# Patient Record
Sex: Male | Born: 1950 | Race: Black or African American | Hispanic: No | Smoking: Current some day smoker
Health system: Southern US, Community
[De-identification: ages and names within clinical notes are randomized; demographics above are authoritative.]

## PROBLEM LIST (undated history)

## (undated) DIAGNOSIS — H409 Unspecified glaucoma: Secondary | ICD-10-CM

## (undated) DIAGNOSIS — J449 Chronic obstructive pulmonary disease, unspecified: Secondary | ICD-10-CM

## (undated) DIAGNOSIS — T7840XA Allergy, unspecified, initial encounter: Secondary | ICD-10-CM

## (undated) DIAGNOSIS — IMO0001 Reserved for inherently not codable concepts without codable children: Secondary | ICD-10-CM

## (undated) DIAGNOSIS — S92911A Unspecified fracture of right toe(s), initial encounter for closed fracture: Secondary | ICD-10-CM

## (undated) DIAGNOSIS — E785 Hyperlipidemia, unspecified: Secondary | ICD-10-CM

## (undated) DIAGNOSIS — Z720 Tobacco use: Secondary | ICD-10-CM

## (undated) DIAGNOSIS — Z973 Presence of spectacles and contact lenses: Secondary | ICD-10-CM

## (undated) DIAGNOSIS — M5412 Radiculopathy, cervical region: Secondary | ICD-10-CM

## (undated) DIAGNOSIS — Z9289 Personal history of other medical treatment: Secondary | ICD-10-CM

## (undated) DIAGNOSIS — M545 Low back pain, unspecified: Secondary | ICD-10-CM

## (undated) DIAGNOSIS — K219 Gastro-esophageal reflux disease without esophagitis: Secondary | ICD-10-CM

## (undated) DIAGNOSIS — D649 Anemia, unspecified: Secondary | ICD-10-CM

## (undated) DIAGNOSIS — J189 Pneumonia, unspecified organism: Secondary | ICD-10-CM

## (undated) DIAGNOSIS — G8929 Other chronic pain: Secondary | ICD-10-CM

## (undated) DIAGNOSIS — I10 Essential (primary) hypertension: Secondary | ICD-10-CM

## (undated) DIAGNOSIS — D689 Coagulation defect, unspecified: Secondary | ICD-10-CM

## (undated) DIAGNOSIS — M1990 Unspecified osteoarthritis, unspecified site: Secondary | ICD-10-CM

## (undated) DIAGNOSIS — I739 Peripheral vascular disease, unspecified: Secondary | ICD-10-CM

## (undated) HISTORY — PX: INGUINAL HERNIA REPAIR: SUR1180

## (undated) HISTORY — DX: Essential (primary) hypertension: I10

## (undated) HISTORY — DX: Unspecified osteoarthritis, unspecified site: M19.90

## (undated) HISTORY — DX: Coagulation defect, unspecified: D68.9

## (undated) HISTORY — DX: Radiculopathy, cervical region: M54.12

## (undated) HISTORY — DX: Unspecified glaucoma: H40.9

## (undated) HISTORY — DX: Chronic obstructive pulmonary disease, unspecified: J44.9

## (undated) HISTORY — DX: Tobacco use: Z72.0

## (undated) HISTORY — DX: Hyperlipidemia, unspecified: E78.5

## (undated) HISTORY — DX: Peripheral vascular disease, unspecified: I73.9

## (undated) HISTORY — DX: Unspecified fracture of right toe(s), initial encounter for closed fracture: S92.911A

## (undated) HISTORY — DX: Allergy, unspecified, initial encounter: T78.40XA

## (undated) HISTORY — DX: Gastro-esophageal reflux disease without esophagitis: K21.9

## (undated) HISTORY — PX: VASCULAR SURGERY: SHX849

---

## 1989-08-26 HISTORY — PX: INGUINAL HERNIA REPAIR: SUR1180

## 1994-12-26 HISTORY — PX: LUMBAR DISC SURGERY: SHX700

## 1994-12-26 HISTORY — PX: BACK SURGERY: SHX140

## 1998-03-30 ENCOUNTER — Ambulatory Visit (HOSPITAL_COMMUNITY): Admission: RE | Admit: 1998-03-30 | Discharge: 1998-03-30 | Payer: Self-pay | Admitting: Gastroenterology

## 2000-12-07 ENCOUNTER — Encounter: Admission: RE | Admit: 2000-12-07 | Discharge: 2000-12-07 | Payer: Self-pay | Admitting: Gastroenterology

## 2000-12-07 ENCOUNTER — Encounter: Payer: Self-pay | Admitting: Gastroenterology

## 2000-12-08 ENCOUNTER — Ambulatory Visit (HOSPITAL_COMMUNITY): Admission: RE | Admit: 2000-12-08 | Discharge: 2000-12-08 | Payer: Self-pay | Admitting: Gastroenterology

## 2000-12-08 ENCOUNTER — Encounter: Payer: Self-pay | Admitting: Gastroenterology

## 2001-12-22 ENCOUNTER — Emergency Department (HOSPITAL_COMMUNITY): Admission: EM | Admit: 2001-12-22 | Discharge: 2001-12-22 | Payer: Self-pay | Admitting: Emergency Medicine

## 2001-12-22 ENCOUNTER — Encounter: Payer: Self-pay | Admitting: Emergency Medicine

## 2001-12-23 ENCOUNTER — Emergency Department (HOSPITAL_COMMUNITY): Admission: EM | Admit: 2001-12-23 | Discharge: 2001-12-23 | Payer: Self-pay

## 2001-12-25 ENCOUNTER — Emergency Department (HOSPITAL_COMMUNITY): Admission: EM | Admit: 2001-12-25 | Discharge: 2001-12-25 | Payer: Self-pay | Admitting: Emergency Medicine

## 2001-12-25 ENCOUNTER — Encounter: Payer: Self-pay | Admitting: Emergency Medicine

## 2002-01-10 ENCOUNTER — Encounter: Payer: Self-pay | Admitting: Gastroenterology

## 2002-01-10 ENCOUNTER — Ambulatory Visit (HOSPITAL_COMMUNITY): Admission: RE | Admit: 2002-01-10 | Discharge: 2002-01-10 | Payer: Self-pay | Admitting: Gastroenterology

## 2004-03-02 ENCOUNTER — Emergency Department (HOSPITAL_COMMUNITY): Admission: EM | Admit: 2004-03-02 | Discharge: 2004-03-02 | Payer: Self-pay | Admitting: Emergency Medicine

## 2004-03-05 ENCOUNTER — Encounter: Admission: RE | Admit: 2004-03-05 | Discharge: 2004-03-05 | Payer: Self-pay | Admitting: Gastroenterology

## 2004-09-14 ENCOUNTER — Emergency Department (HOSPITAL_COMMUNITY): Admission: EM | Admit: 2004-09-14 | Discharge: 2004-09-14 | Payer: Self-pay | Admitting: Family Medicine

## 2004-09-17 ENCOUNTER — Emergency Department (HOSPITAL_COMMUNITY): Admission: EM | Admit: 2004-09-17 | Discharge: 2004-09-17 | Payer: Self-pay | Admitting: Emergency Medicine

## 2004-09-28 ENCOUNTER — Emergency Department (HOSPITAL_COMMUNITY): Admission: EM | Admit: 2004-09-28 | Discharge: 2004-09-28 | Payer: Self-pay | Admitting: Emergency Medicine

## 2004-10-01 ENCOUNTER — Emergency Department (HOSPITAL_COMMUNITY): Admission: EM | Admit: 2004-10-01 | Discharge: 2004-10-01 | Payer: Self-pay | Admitting: Family Medicine

## 2004-10-08 ENCOUNTER — Emergency Department (HOSPITAL_COMMUNITY): Admission: AD | Admit: 2004-10-08 | Discharge: 2004-10-08 | Payer: Self-pay | Admitting: Family Medicine

## 2004-10-29 ENCOUNTER — Ambulatory Visit: Payer: Self-pay | Admitting: Family Medicine

## 2004-11-12 ENCOUNTER — Ambulatory Visit: Payer: Self-pay | Admitting: Family Medicine

## 2004-11-15 ENCOUNTER — Encounter: Admission: RE | Admit: 2004-11-15 | Discharge: 2004-11-15 | Payer: Self-pay | Admitting: *Deleted

## 2004-12-22 ENCOUNTER — Ambulatory Visit: Payer: Self-pay | Admitting: Sports Medicine

## 2004-12-24 ENCOUNTER — Encounter: Admission: RE | Admit: 2004-12-24 | Discharge: 2005-01-27 | Payer: Self-pay | Admitting: *Deleted

## 2005-01-28 ENCOUNTER — Ambulatory Visit: Payer: Self-pay | Admitting: Sports Medicine

## 2005-03-09 ENCOUNTER — Ambulatory Visit: Payer: Self-pay | Admitting: Family Medicine

## 2005-05-02 ENCOUNTER — Ambulatory Visit: Payer: Self-pay | Admitting: Family Medicine

## 2005-05-05 ENCOUNTER — Ambulatory Visit: Payer: Self-pay | Admitting: Family Medicine

## 2005-06-11 ENCOUNTER — Emergency Department (HOSPITAL_COMMUNITY): Admission: EM | Admit: 2005-06-11 | Discharge: 2005-06-11 | Payer: Self-pay | Admitting: Emergency Medicine

## 2005-07-20 ENCOUNTER — Ambulatory Visit: Payer: Self-pay | Admitting: Sports Medicine

## 2005-08-02 ENCOUNTER — Ambulatory Visit (HOSPITAL_COMMUNITY): Admission: RE | Admit: 2005-08-02 | Discharge: 2005-08-02 | Payer: Self-pay | Admitting: Sports Medicine

## 2005-08-17 ENCOUNTER — Ambulatory Visit: Payer: Self-pay | Admitting: Family Medicine

## 2005-09-16 ENCOUNTER — Ambulatory Visit: Payer: Self-pay | Admitting: Family Medicine

## 2005-09-27 ENCOUNTER — Ambulatory Visit: Payer: Self-pay | Admitting: Sports Medicine

## 2005-10-04 ENCOUNTER — Inpatient Hospital Stay (HOSPITAL_COMMUNITY): Admission: AD | Admit: 2005-10-04 | Discharge: 2005-10-05 | Payer: Self-pay | Admitting: Cardiology

## 2005-11-28 ENCOUNTER — Ambulatory Visit: Payer: Self-pay | Admitting: Family Medicine

## 2005-12-27 ENCOUNTER — Ambulatory Visit (HOSPITAL_COMMUNITY): Admission: RE | Admit: 2005-12-27 | Discharge: 2005-12-27 | Payer: Self-pay | Admitting: Cardiology

## 2006-02-18 IMAGING — CR DG CHEST 2V
2 series · 2 of 2 positions shown · non-contrast
Comparison: 03/02/04
 There is some mild central peribronchial thickening.

CLINICAL DATA: Chest pain, shortness of breath, cough. 
 TWO VIEW CHEST

[view not recorded (1 of 2)]
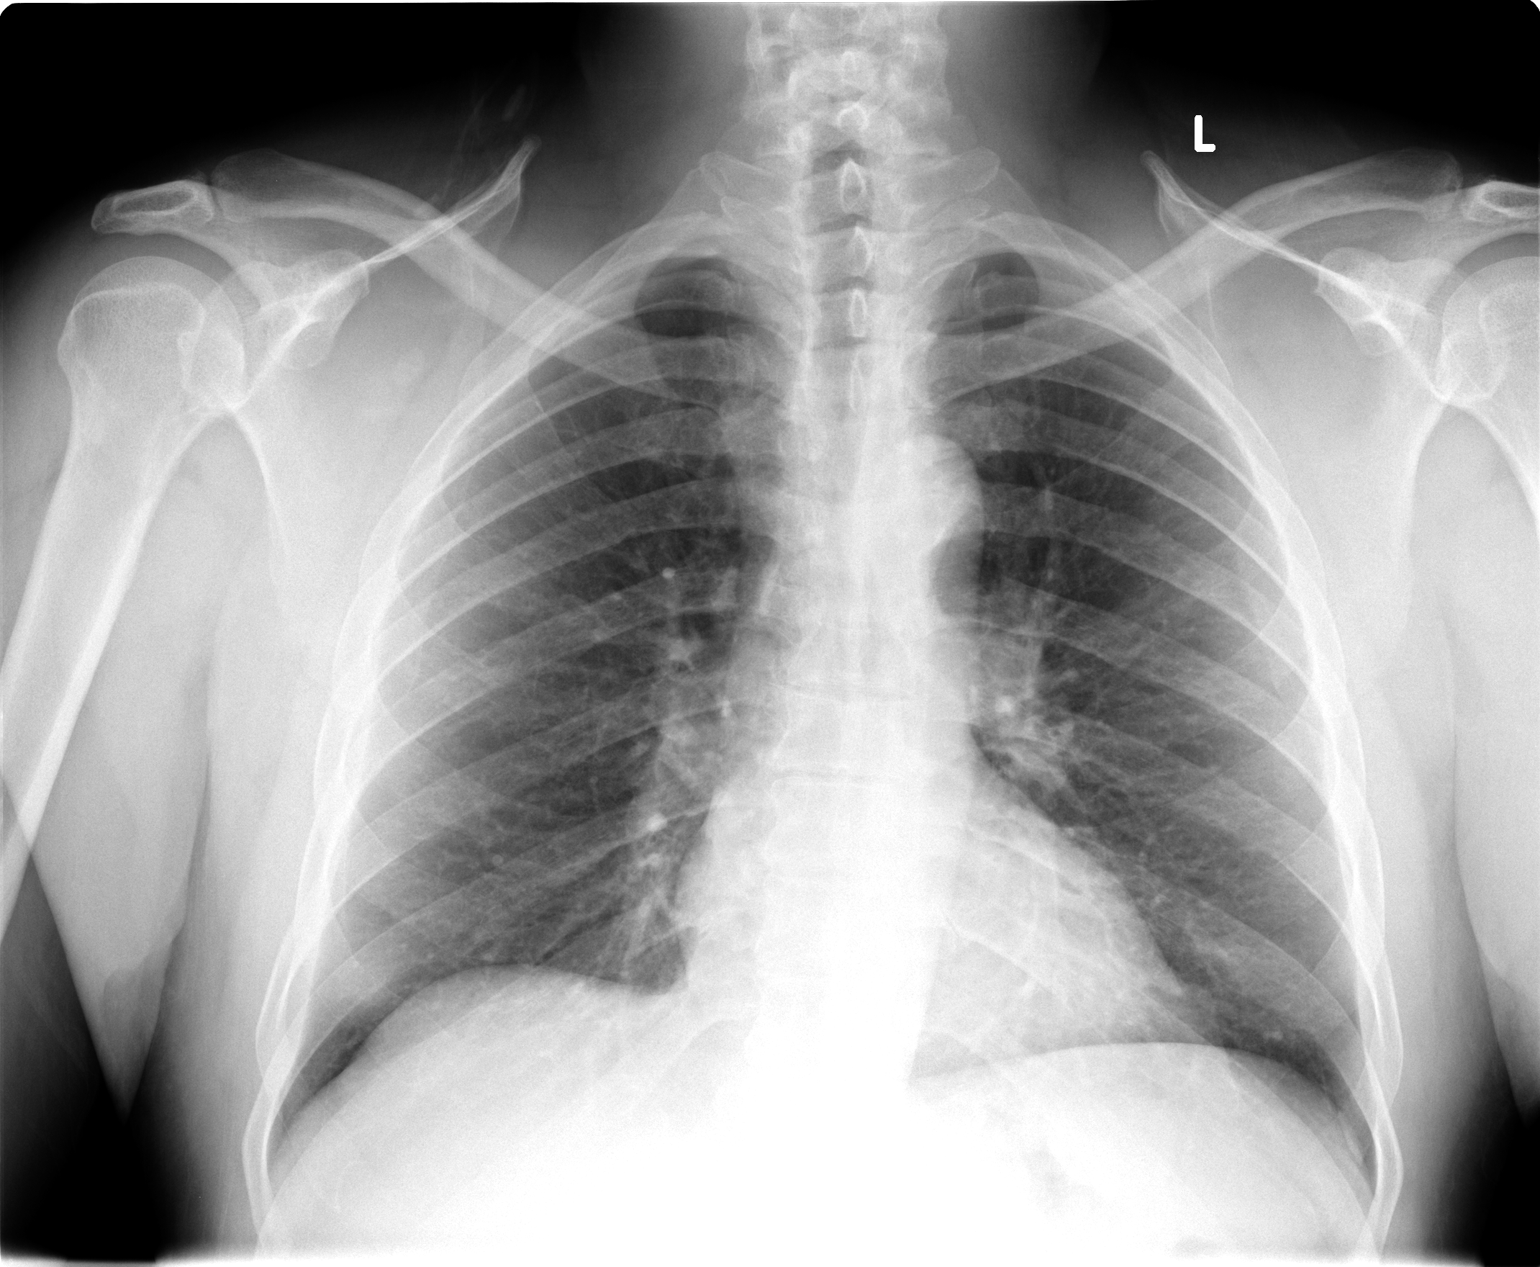

[view not recorded (2 of 2)]
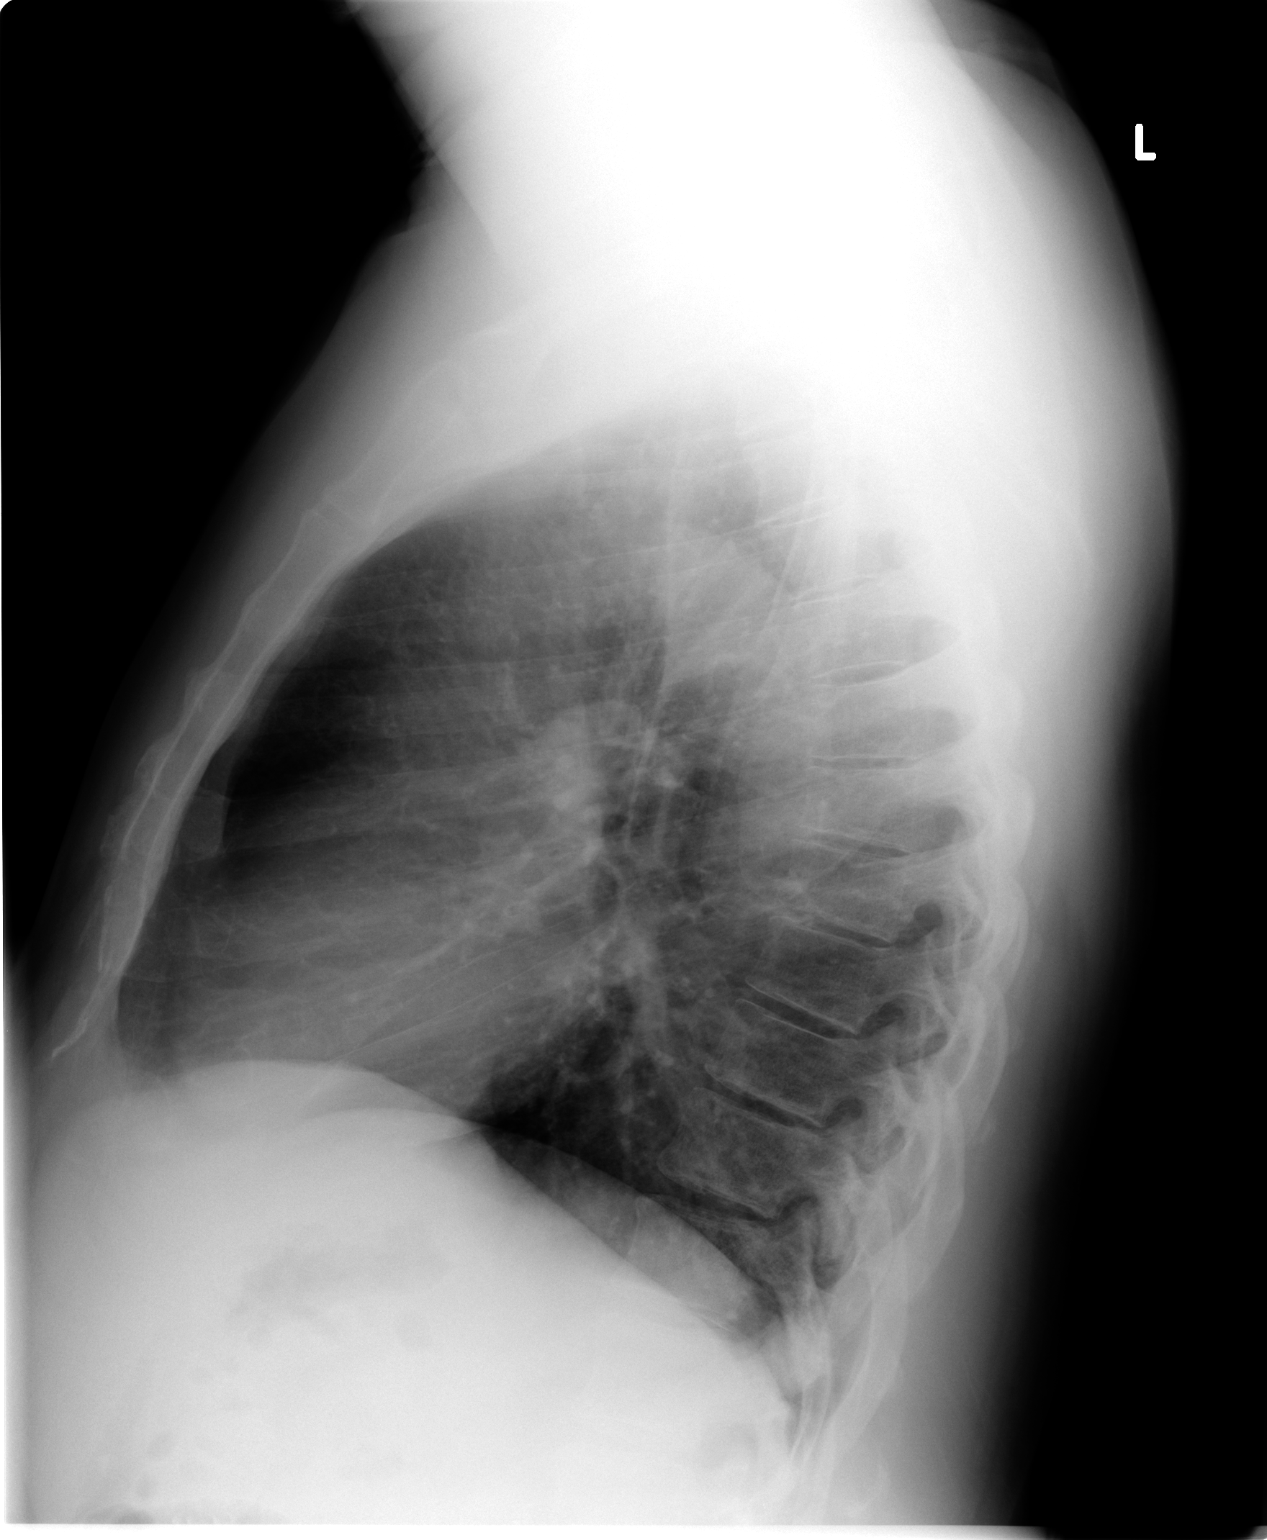

[2 of 2 positions shown; findings below may reference images not displayed]

No focal infiltrate or overt edema. Heart size normal. Mildly tortuous thoracic aorta. No effusion.  
 IMPRESSION 
 Mild peribronchial thickening, stable since 03/02/04.

## 2006-02-20 ENCOUNTER — Ambulatory Visit: Payer: Self-pay | Admitting: Family Medicine

## 2006-05-26 ENCOUNTER — Emergency Department (HOSPITAL_COMMUNITY): Admission: EM | Admit: 2006-05-26 | Discharge: 2006-05-26 | Payer: Self-pay | Admitting: Emergency Medicine

## 2006-07-19 ENCOUNTER — Ambulatory Visit: Payer: Self-pay | Admitting: Family Medicine

## 2006-08-09 ENCOUNTER — Ambulatory Visit: Payer: Self-pay | Admitting: Family Medicine

## 2006-10-28 ENCOUNTER — Emergency Department (HOSPITAL_COMMUNITY): Admission: EM | Admit: 2006-10-28 | Discharge: 2006-10-28 | Payer: Self-pay | Admitting: Emergency Medicine

## 2007-02-22 DIAGNOSIS — Z720 Tobacco use: Secondary | ICD-10-CM | POA: Insufficient documentation

## 2007-02-22 DIAGNOSIS — M1990 Unspecified osteoarthritis, unspecified site: Secondary | ICD-10-CM | POA: Insufficient documentation

## 2007-02-22 DIAGNOSIS — E7800 Pure hypercholesterolemia, unspecified: Secondary | ICD-10-CM | POA: Insufficient documentation

## 2007-02-22 DIAGNOSIS — I739 Peripheral vascular disease, unspecified: Secondary | ICD-10-CM | POA: Insufficient documentation

## 2007-02-22 DIAGNOSIS — E669 Obesity, unspecified: Secondary | ICD-10-CM | POA: Insufficient documentation

## 2007-02-22 DIAGNOSIS — F17200 Nicotine dependence, unspecified, uncomplicated: Secondary | ICD-10-CM | POA: Insufficient documentation

## 2007-02-22 DIAGNOSIS — I10 Essential (primary) hypertension: Secondary | ICD-10-CM | POA: Insufficient documentation

## 2007-03-01 ENCOUNTER — Encounter: Payer: Self-pay | Admitting: Internal Medicine

## 2007-03-07 ENCOUNTER — Encounter: Payer: Self-pay | Admitting: Family Medicine

## 2007-03-07 ENCOUNTER — Inpatient Hospital Stay (HOSPITAL_COMMUNITY): Admission: RE | Admit: 2007-03-07 | Discharge: 2007-03-08 | Payer: Self-pay | Admitting: Cardiology

## 2007-04-03 ENCOUNTER — Encounter: Payer: Self-pay | Admitting: Family Medicine

## 2007-08-30 ENCOUNTER — Ambulatory Visit: Payer: Self-pay | Admitting: Family Medicine

## 2007-08-30 ENCOUNTER — Encounter: Admission: RE | Admit: 2007-08-30 | Discharge: 2007-08-30 | Payer: Self-pay | Admitting: Sports Medicine

## 2007-09-03 ENCOUNTER — Telehealth (INDEPENDENT_AMBULATORY_CARE_PROVIDER_SITE_OTHER): Payer: Self-pay | Admitting: Family Medicine

## 2007-09-04 ENCOUNTER — Ambulatory Visit: Payer: Self-pay | Admitting: Family Medicine

## 2007-09-04 ENCOUNTER — Telehealth (INDEPENDENT_AMBULATORY_CARE_PROVIDER_SITE_OTHER): Payer: Self-pay | Admitting: Family Medicine

## 2007-09-04 ENCOUNTER — Encounter (INDEPENDENT_AMBULATORY_CARE_PROVIDER_SITE_OTHER): Payer: Self-pay | Admitting: Family Medicine

## 2007-09-04 LAB — CONVERTED CEMR LAB
ALT: 13 units/L (ref 0–53)
AST: 19 units/L (ref 0–37)
Albumin: 4.4 g/dL (ref 3.5–5.2)
Alkaline Phosphatase: 81 units/L (ref 39–117)
BUN: 12 mg/dL (ref 6–23)
CO2: 22 meq/L (ref 19–32)
Calcium: 8.8 mg/dL (ref 8.4–10.5)
Chloride: 103 meq/L (ref 96–112)
Cholesterol: 111 mg/dL (ref 0–200)
Creatinine, Ser: 0.84 mg/dL (ref 0.40–1.50)
Glucose, Bld: 115 mg/dL — ABNORMAL HIGH (ref 70–99)
HDL: 36 mg/dL — ABNORMAL LOW (ref >39)
LDL Cholesterol: 35 mg/dL (ref 0–99)
Potassium: 4.1 meq/L (ref 3.5–5.3)
Sodium: 137 meq/L (ref 135–145)
Total Bilirubin: 0.4 mg/dL (ref 0.3–1.2)
Total CHOL/HDL Ratio: 3.1
Total Protein: 7.2 g/dL (ref 6.0–8.3)
Triglycerides: 201 mg/dL — ABNORMAL HIGH (ref <150)
VLDL: 40 mg/dL (ref 0–40)

## 2007-09-19 ENCOUNTER — Encounter (INDEPENDENT_AMBULATORY_CARE_PROVIDER_SITE_OTHER): Payer: Self-pay | Admitting: Family Medicine

## 2007-09-28 ENCOUNTER — Encounter (INDEPENDENT_AMBULATORY_CARE_PROVIDER_SITE_OTHER): Payer: Self-pay | Admitting: Family Medicine

## 2007-09-28 ENCOUNTER — Encounter (INDEPENDENT_AMBULATORY_CARE_PROVIDER_SITE_OTHER): Payer: Self-pay | Admitting: *Deleted

## 2007-10-16 ENCOUNTER — Encounter (INDEPENDENT_AMBULATORY_CARE_PROVIDER_SITE_OTHER): Payer: Self-pay | Admitting: Family Medicine

## 2007-10-31 ENCOUNTER — Ambulatory Visit: Payer: Self-pay | Admitting: Family Medicine

## 2007-11-28 ENCOUNTER — Telehealth (INDEPENDENT_AMBULATORY_CARE_PROVIDER_SITE_OTHER): Payer: Self-pay | Admitting: Family Medicine

## 2007-12-31 ENCOUNTER — Ambulatory Visit: Payer: Self-pay | Admitting: Family Medicine

## 2007-12-31 LAB — CONVERTED CEMR LAB: Hgb A1c MFr Bld: 6.9 %

## 2008-05-09 ENCOUNTER — Encounter (INDEPENDENT_AMBULATORY_CARE_PROVIDER_SITE_OTHER): Payer: Self-pay | Admitting: Family Medicine

## 2008-06-30 ENCOUNTER — Encounter: Admission: RE | Admit: 2008-06-30 | Discharge: 2008-06-30 | Payer: Self-pay | Admitting: Gastroenterology

## 2008-09-15 ENCOUNTER — Encounter (INDEPENDENT_AMBULATORY_CARE_PROVIDER_SITE_OTHER): Payer: Self-pay | Admitting: Family Medicine

## 2008-09-24 ENCOUNTER — Encounter: Admission: RE | Admit: 2008-09-24 | Discharge: 2008-09-24 | Payer: Self-pay | Admitting: Neurosurgery

## 2008-10-03 ENCOUNTER — Inpatient Hospital Stay (HOSPITAL_COMMUNITY): Admission: RE | Admit: 2008-10-03 | Discharge: 2008-10-04 | Payer: Self-pay | Admitting: Neurosurgery

## 2008-10-23 ENCOUNTER — Ambulatory Visit: Payer: Self-pay | Admitting: Family Medicine

## 2008-10-29 ENCOUNTER — Encounter (INDEPENDENT_AMBULATORY_CARE_PROVIDER_SITE_OTHER): Payer: Self-pay | Admitting: Family Medicine

## 2008-12-15 ENCOUNTER — Encounter (INDEPENDENT_AMBULATORY_CARE_PROVIDER_SITE_OTHER): Payer: Self-pay | Admitting: Family Medicine

## 2009-01-14 ENCOUNTER — Encounter (INDEPENDENT_AMBULATORY_CARE_PROVIDER_SITE_OTHER): Payer: Self-pay | Admitting: Family Medicine

## 2009-01-14 ENCOUNTER — Ambulatory Visit: Payer: Self-pay | Admitting: Family Medicine

## 2009-01-14 DIAGNOSIS — K219 Gastro-esophageal reflux disease without esophagitis: Secondary | ICD-10-CM | POA: Insufficient documentation

## 2009-01-14 LAB — CONVERTED CEMR LAB
ALT: 12 units/L (ref 0–53)
AST: 17 units/L (ref 0–37)
Albumin: 4.4 g/dL (ref 3.5–5.2)
Alkaline Phosphatase: 79 units/L (ref 39–117)
BUN: 11 mg/dL (ref 6–23)
CO2: 18 meq/L — ABNORMAL LOW (ref 19–32)
Calcium: 8.6 mg/dL (ref 8.4–10.5)
Chloride: 101 meq/L (ref 96–112)
Creatinine, Ser: 0.91 mg/dL (ref 0.40–1.50)
Glucose, Bld: 104 mg/dL — ABNORMAL HIGH (ref 70–99)
Hgb A1c MFr Bld: 6.7 %
Potassium: 4.2 meq/L (ref 3.5–5.3)
Sodium: 138 meq/L (ref 135–145)
Total Bilirubin: 0.3 mg/dL (ref 0.3–1.2)
Total Protein: 7.7 g/dL (ref 6.0–8.3)

## 2009-01-15 ENCOUNTER — Encounter (INDEPENDENT_AMBULATORY_CARE_PROVIDER_SITE_OTHER): Payer: Self-pay | Admitting: Family Medicine

## 2009-01-30 ENCOUNTER — Telehealth (INDEPENDENT_AMBULATORY_CARE_PROVIDER_SITE_OTHER): Payer: Self-pay | Admitting: *Deleted

## 2009-02-16 ENCOUNTER — Encounter (INDEPENDENT_AMBULATORY_CARE_PROVIDER_SITE_OTHER): Payer: Self-pay | Admitting: Family Medicine

## 2009-03-17 ENCOUNTER — Encounter (INDEPENDENT_AMBULATORY_CARE_PROVIDER_SITE_OTHER): Payer: Self-pay | Admitting: Family Medicine

## 2009-06-18 ENCOUNTER — Ambulatory Visit: Payer: Self-pay | Admitting: Family Medicine

## 2009-06-18 DIAGNOSIS — E119 Type 2 diabetes mellitus without complications: Secondary | ICD-10-CM | POA: Insufficient documentation

## 2009-06-18 LAB — CONVERTED CEMR LAB: Hgb A1c MFr Bld: 7.3 %

## 2009-06-30 ENCOUNTER — Encounter: Admission: RE | Admit: 2009-06-30 | Discharge: 2009-09-28 | Payer: Self-pay | Admitting: Family Medicine

## 2009-07-02 ENCOUNTER — Encounter: Payer: Self-pay | Admitting: Family Medicine

## 2009-07-08 ENCOUNTER — Encounter: Payer: Self-pay | Admitting: Family Medicine

## 2009-07-13 ENCOUNTER — Ambulatory Visit (HOSPITAL_COMMUNITY): Admission: AD | Admit: 2009-07-13 | Discharge: 2009-07-14 | Payer: Self-pay | Admitting: Cardiology

## 2009-09-03 ENCOUNTER — Encounter: Payer: Self-pay | Admitting: Family Medicine

## 2009-09-18 ENCOUNTER — Encounter (INDEPENDENT_AMBULATORY_CARE_PROVIDER_SITE_OTHER): Payer: Self-pay | Admitting: *Deleted

## 2009-11-11 ENCOUNTER — Ambulatory Visit: Payer: Self-pay | Admitting: Family Medicine

## 2009-12-08 ENCOUNTER — Encounter: Payer: Self-pay | Admitting: Family Medicine

## 2010-03-15 ENCOUNTER — Encounter: Payer: Self-pay | Admitting: Family Medicine

## 2010-03-24 ENCOUNTER — Encounter: Payer: Self-pay | Admitting: Family Medicine

## 2010-04-26 ENCOUNTER — Encounter: Admission: RE | Admit: 2010-04-26 | Discharge: 2010-04-26 | Payer: Self-pay | Admitting: Cardiovascular Disease

## 2010-04-29 ENCOUNTER — Ambulatory Visit (HOSPITAL_COMMUNITY): Admission: RE | Admit: 2010-04-29 | Discharge: 2010-04-30 | Payer: Self-pay | Admitting: Cardiovascular Disease

## 2010-05-03 ENCOUNTER — Telehealth: Payer: Self-pay | Admitting: *Deleted

## 2010-07-01 ENCOUNTER — Ambulatory Visit: Payer: Self-pay | Admitting: Family Medicine

## 2010-07-01 ENCOUNTER — Encounter: Payer: Self-pay | Admitting: Family Medicine

## 2010-07-01 LAB — CONVERTED CEMR LAB
ALT: 17 units/L (ref 0–53)
AST: 21 units/L (ref 0–37)
Albumin: 4.4 g/dL (ref 3.5–5.2)
Alkaline Phosphatase: 66 units/L (ref 39–117)
BUN: 11 mg/dL (ref 6–23)
CO2: 19 meq/L (ref 19–32)
Calcium: 9.9 mg/dL (ref 8.4–10.5)
Chloride: 107 meq/L (ref 96–112)
Cholesterol: 153 mg/dL (ref 0–200)
Creatinine, Ser: 0.82 mg/dL (ref 0.40–1.50)
Glucose, Bld: 103 mg/dL — ABNORMAL HIGH (ref 70–99)
HDL: 44 mg/dL (ref >39)
Hgb A1c MFr Bld: 6.1 %
LDL Cholesterol: 55 mg/dL (ref 0–99)
Potassium: 4.4 meq/L (ref 3.5–5.3)
Sodium: 139 meq/L (ref 135–145)
Total Bilirubin: 0.3 mg/dL (ref 0.3–1.2)
Total CHOL/HDL Ratio: 3.5
Total Protein: 7.6 g/dL (ref 6.0–8.3)
Triglycerides: 271 mg/dL — ABNORMAL HIGH (ref <150)
VLDL: 54 mg/dL — ABNORMAL HIGH (ref 0–40)

## 2010-10-01 ENCOUNTER — Ambulatory Visit: Payer: Self-pay | Admitting: Family Medicine

## 2010-10-01 LAB — CONVERTED CEMR LAB: Hgb A1c MFr Bld: 6.3 %

## 2010-10-20 ENCOUNTER — Encounter: Payer: Self-pay | Admitting: Family Medicine

## 2010-10-28 ENCOUNTER — Encounter: Payer: Self-pay | Admitting: Family Medicine

## 2010-11-09 ENCOUNTER — Ambulatory Visit: Payer: Self-pay | Admitting: Family Medicine

## 2011-01-17 ENCOUNTER — Encounter: Payer: Self-pay | Admitting: Neurosurgery

## 2011-01-25 NOTE — Consult Note (Signed)
Summary: [facility]  [facility]   Imported By: Eather Colas PATE CMA, 05/14/2008 15:40:19  _____________________________________________________________________  External Attachment:    Type:   Image     Comment:   External Document

## 2011-01-25 NOTE — Assessment & Plan Note (Signed)
Summary: [name]   Vital Signs:  Patient profile:   60 year old male Height:      66 inches Weight:      203.2 pounds BMI:     32.92 Temp:     98.5 degrees F oral Pulse rate:   86 / minute BP sitting:   140 / 75  (left arm) Cuff size:   regular  Vitals Entered By: Garen Grams LPN (October 01, 2010 10:15 AM) CC: f/u HTN,DM, left ear pain Is Patient Diabetic? Yes Did you bring your meter with you today? No Pain Assessment Patient in pain? no        Primary Care Provider:  Levander Campion MD  CC:  f/u HTN, DM, and left ear pain.  History of Present Illness:   Here to f/u DM, HTN,     HTN- Taking all meds as prescribed, no chest pain, no dizziness, no SOB, does not have BP cuff at home    DM- tolerating Metformin and Glipizide, no polyuria, lowest BS 86, highest in 200's a few months ago   Ear pain- left ear pain x 1 week, attemped to clean it out no change, no ringing in ears, able to hear as normal, no drainage from ear. No sore throat, runny nose last week, no cough  Habits & Providers  Alcohol-Tobacco-Diet     Tobacco Status: current     Tobacco Counseling: to quit use of tobacco products     Cigarette Packs/Day: 0.5  Current Medications (verified): 1)  Neurontin 800 Mg  Tabs (Gabapentin) .Marland Kitchen.. 1 By Mouth Twice Daily 2)  Metformin Hcl 500 Mg  Tabs (Metformin Hcl) .Marland Kitchen.. 1 By Mouth Twice Daily 3)  Lotrel 10-20 Mg  Caps (Amlodipine Besy-Benazepril Hcl) .Marland Kitchen.. 1 By Mouth Daily 4)  Vytorin 10-40 Mg  Tabs (Ezetimibe-Simvastatin) .Marland Kitchen.. 1 By Mouth Daily 5)  Pantoprazole Sodium 40 Mg Tbec (Pantoprazole Sodium) .Marland Kitchen.. 1 By Mouth Daily For Heartburn 6)  Plavix 75 Mg  Tabs (Clopidogrel Bisulfate) .Marland Kitchen.. 1 By Mouth Daily 7)  Nabumetone 500 Mg  Tabs (Nabumetone) .Marland Kitchen.. 1 By Mouth Daily 8)  Wellbutrin Xl 150 Mg  Tb24 (Bupropion Hcl) .Marland Kitchen.. 1 By Mouth Daily 9)  Glipizide 5 Mg Tabs (Glipizide) .Marland Kitchen.. 1 By Mouth Daily 10)  Blood Glucose Meter  Kit (Blood Glucose Monitoring  Suppl) .... Check Blood Sugar Daily, Sometimes in Am Before Eating, and Sometimes 2 Hours After A Meal, and Record 11)  Pletal 100 Mg Tabs (Cilostazol) .Marland Kitchen.. 1 By Mouth Two Times A Day 12)  Bystolic 20 Mg Tabs (Nebivolol Hcl) .Marland Kitchen.. 1 By Mouth Daily For High Blood Pressure  Allergies (verified): No Known Drug Allergies  Past History:  Past Medical History: Last updated: 07/01/2010 Baseline Cr- 0.9 on 8/07,  cervical radiculopathy (spurring at c5-c6),  hiatial hernia HTN DM PAD hyperlipidemia GERD Osteoarthritis Tobacco abuse   Physical Exam  General:  Vital signs noted NAD alert and oriented Ears:  Right TM,canal clear, Left TM occluded with hard cerumen, s/p extraction TM clear, no erythema, normal hearing bilat Nose:  no nasal discharge.   Mouth:  MMM Neck:  no carotid bruit  Lungs:  CTAB Heart:  RRR, no murmur, no gallop Pulses:  radial pulses 2+, DP decreased bilat Extremities:  no edema Sensation in tact grossly in tact in feet, lower ext   Impression & Recommendations:  Problem # 1:  DIABETES MELLITUS, TYPE II (ICD-250.00) Assessment Unchanged No change to meds, progressing well His updated  medication list for this problem includes:    Metformin Hcl 500 Mg Tabs (Metformin hcl) .Marland Kitchen... 1 by mouth twice daily    Lotrel 10-20 Mg Caps (Amlodipine besy-benazepril hcl) .Marland Kitchen... 1 by mouth daily    Glipizide 5 Mg Tabs (Glipizide) .Marland Kitchen... 1 by mouth daily  Orders: A1C-[facility] (34742) [facility]- Est  Level 4 (59563)  Labs Reviewed: Creat: 0.82 (07/01/2010)    Reviewed HgBA1c results: 6.3 (10/01/2010)  6.1 (07/01/2010)  Problem # 2:  HYPERTENSION, BENIGN SYSTEMIC (ICD-401.1) Assessment: Improved  Improved, back on meds, increase BB to 20mg , goal < 130/80 His updated medication list for this problem includes:    Lotrel 10-20 Mg Caps (Amlodipine besy-benazepril hcl) .Marland Kitchen... 1 by mouth daily    Bystolic 20 Mg Tabs (Nebivolol hcl) .Marland Kitchen... 1 by mouth daily for high blood  pressure  Orders: [facility]- Est  Level 4 (99214)  Problem # 3:  CERUMEN IMPACTION, LEFT (ICD-380.4) Assessment: New ear pain improved s/p wash out Orders: Cerumen Impaction Removal-[facility] (87564) [facility]- Est  Level 4 (33295)  Problem # 4:  TOBACCO DEPENDENCE (ICD-305.1) Assessment: Unchanged  Orders: [facility]- Est  Level 4 (99214)  Complete Medication List: 1)  Neurontin 800 Mg Tabs (Gabapentin) .Marland Kitchen.. 1 by mouth twice daily 2)  Metformin Hcl 500 Mg Tabs (Metformin hcl) .Marland Kitchen.. 1 by mouth twice daily 3)  Lotrel 10-20 Mg Caps (Amlodipine besy-benazepril hcl) .Marland Kitchen.. 1 by mouth daily 4)  Vytorin 10-40 Mg Tabs (Ezetimibe-simvastatin) .Marland Kitchen.. 1 by mouth daily 5)  Pantoprazole Sodium 40 Mg Tbec (Pantoprazole sodium) .Marland Kitchen.. 1 by mouth daily for heartburn 6)  Plavix 75 Mg Tabs (Clopidogrel bisulfate) .Marland Kitchen.. 1 by mouth daily 7)  Nabumetone 500 Mg Tabs (Nabumetone) .Marland Kitchen.. 1 by mouth daily 8)  Wellbutrin Xl 150 Mg Tb24 (Bupropion hcl) .Marland Kitchen.. 1 by mouth daily 9)  Glipizide 5 Mg Tabs (Glipizide) .Marland Kitchen.. 1 by mouth daily 10)  Blood Glucose Meter Kit (Blood glucose monitoring suppl) .... Check blood sugar daily, sometimes in am before eating, and sometimes 2 hours after a meal, and record 11)  Pletal 100 Mg Tabs (Cilostazol) .Marland Kitchen.. 1 by mouth two times a day 12)  Bystolic 20 Mg Tabs (Nebivolol hcl) .Marland Kitchen.. 1 by mouth daily for high blood pressure  Other Orders: Influenza Vaccine NON MCR (18841) Pneumococcal Vaccine (66063) Admin 1st Vaccine (01601)  Patient Instructions: 1)  Increase your bystolic to 20mg  daily 2)  Return in 2 week for a nurse visit for BP and heart rate check 3)  Try to pick up a blood pressure machine and take your blood pressure three times a week- try to get the arm cuff 4)  Do not take both ompeprazole and pantoprazole  5)  Today you got your flu shot and Pneumonia shot   Prescriptions: BYSTOLIC 20 MG TABS (NEBIVOLOL HCL) 1 by mouth daily for high blood pressure  #30 x 3   Entered and Authorized by:    Milinda Antis MD   Signed by:   Milinda Antis MD on 10/01/2010   Method used:   [facility]        [facility] [address] [phone]* (retail)       [address]       [place], [place]  [address]       Ph: [phone] or [phone]       Fax: [phone]   RxID:   [id number]   Laboratory Results   Blood Tests   Date/Time Received: October 01, 2010 10:08 AM  Date/Time Reported: October 01, 2010 10:23 AM   HGBA1C: 6.3%   (Normal Range: Non-Diabetic - 3-6%   Control Diabetic - 6-8%)  Comments: .......test performed by........Marland Kitchen Garen Grams, LPN entered by Terese Door, CMA          Prevention & Chronic Care Immunizations   Influenza vaccine: Fluvax Non-MCR  (10/01/2010)   Influenza vaccine due: 10/30/2008    Tetanus booster: 12/26/2001: Done.   Tetanus booster due: 12/27/2011    Pneumococcal vaccine: Pneumovax  (10/01/2010)    H. zoster vaccine: Not documented  Colorectal Screening   Hemoccult: Not documented   Hemoccult due: Not Indicated    Colonoscopy: Done.  (10/01/2006)   Colonoscopy due: 10/01/2016  Other Screening   PSA: Not documented   Smoking status: current  (10/01/2010)  Diabetes Mellitus   HgbA1C: 6.3  (10/01/2010)   Hemoglobin A1C due: 03/30/2008    Eye exam: Not documented    Foot exam: Not documented   High risk foot: Not documented   Foot care education: Not documented    Urine microalbumin/creatinine ratio: Not documented    Diabetes flowsheet reviewed?: Yes   Progress toward A1C goal: At goal  Lipids   Total Cholesterol: 153  (07/01/2010)   LDL: 55  (07/01/2010)   LDL Direct: Not documented   HDL: 44  (07/01/2010)   Triglycerides: 271  (07/01/2010)    SGOT (AST): 21  (07/01/2010)   SGPT (ALT): 17  (07/01/2010)   Alkaline phosphatase: 66  (07/01/2010)   Total bilirubin: 0.3  (07/01/2010)    Lipid flowsheet reviewed?: Yes   Progress toward LDL goal: At goal  Hypertension   Last Blood  Pressure: 140 / 75  (10/01/2010)   Serum creatinine: 0.82  (07/01/2010)   Serum potassium 4.4  (07/01/2010)    Hypertension flowsheet reviewed?: Yes   Progress toward BP goal: Improved  Self-Management Support :   Personal Goals (by the next clinic visit) :     Personal A1C goal: 7  (07/01/2010)     Personal blood pressure goal: 130/80  (07/01/2010)     Personal LDL goal: 70  (07/01/2010)    Diabetes self-management support: Not documented    Hypertension self-management support: Not documented    Lipid self-management support: Not documented    Immunizations Administered:  Influenza Vaccine # 1:    Vaccine Type: Fluvax Non-MCR    Site: right deltoid    Mfr: GlaxoSmithKline    Dose: 0.5 ml    Route: IM    Given by: Jone Baseman CMA    Exp. Date: 06/22/2011    Lot #: [id number]    VIS given: 07/20/10 version given October 01, 2010.  Pneumonia Vaccine:    Vaccine Type: Pneumovax    Site: left deltoid    Mfr: Merck    Dose: 0.5 ml    Route: IM    Given by: Jone Baseman CMA    Exp. Date: 03/13/2012    Lot #: [id number]    VIS given: 11/30/09 version given October 01, 2010.  Flu Vaccine Consent Questions:    Do you have a history of severe allergic reactions to this vaccine? no    Any prior history of allergic reactions to egg and/or gelatin? no    Do you have a sensitivity to the preservative Thimersol? no    Do you have a past history of Guillan-Barre Syndrome? no    Do you currently have an acute febrile illness?  no    Have you ever had a severe reaction to latex? no    Vaccine information given and explained to patient? yes

## 2011-01-25 NOTE — Assessment & Plan Note (Signed)
Summary: f/up,tcb   Vital Signs:  Patient profile:   60 year old male Weight:      199 pounds Pulse rate:   75 / minute BP sitting:   169 / 92  (right arm) Cuff size:   large  Vitals Entered By: Arlyss Repress CMA, 07/09/10 10:14 AM) CC: f/up HTN. refill meds.out of meds x 3 days. Is Patient Diabetic? No Pain Assessment Patient in pain? no        Primary Care Provider:  Levander Campion MD  CC:  f/up HTN. refill meds.out of meds x 3 days.Marland Kitchen  History of Present Illness:   HTN- out of meds, pt unsure which meds he has at home, brought a list. Recently started on bystolic by Dr. Jacinto Halim. denies chest pain, HA.  [facility]    DM- tolerating Metformin and Glipizide, no polyuria  Hyperlipidemia- tolearting statins, needs blood work today   PAD- poor circulation followed by Dr. Nadara Eaton  Medications reviewed, pt to call back with specific med bottles and which need refills   Habits & Providers  Alcohol-Tobacco-Diet     Tobacco Status: current     Tobacco Counseling: to quit use of tobacco products     Other Tobacco cigar  Comments: smokes 2 cigars daily  Past History:  Family History: Last updated: 07-09-2010 Father- deceased at 85 w/ HTN and heart disease, Mother- deceased at 47 w/ colon CA Brother- PAD  Social History: Last updated: 02/22/2007 Lives in an apt w/ his wife x 10 yrs.  Smokes 1/2 pack per day X 23 years.  Graduated from [employer].  [employer] X 12 yrs.  On full disability secondary to back injury.  Used to work for [employer].  Past Medical History: Baseline Cr- 0.9 on 8/07,  cervical radiculopathy (spurring at c5-c6),  hiatial hernia HTN DM PAD hyperlipidemia GERD Osteoarthritis Tobacco abuse   Past Surgical History: Back surgery - 89 & 89 - 11/30/2004, cardiolite 9/06- no ischemia,  diaphragmatic attenuation artifact - 09/16/2005,  stent of right SFA 12/06 - 01/31/2006  Family History: Father- deceased at 57  w/ HTN and heart disease, Mother- deceased at 44 w/ colon CA Brother- PAD  Physical Exam  General:  Vital signs noted NAD alert and oriented Neck:  no carotid bruit  Lungs:  CTAB Heart:  RRR, no murmur, no gallop Pulses:  radial pulses 2+, DP decreased bilat Extremities:  no edema Sensation in tact    Impression & Recommendations:  Problem # 1:  DIABETES MELLITUS, TYPE II (ICD-250.00) Assessment Improved Labs obtained, no change to meds, improved A1C His updated medication list for this problem includes:    Metformin Hcl 500 Mg Tabs (Metformin hcl) .Marland Kitchen... 1 by mouth twice daily    Lotrel 10-20 Mg Caps (Amlodipine besy-benazepril hcl) .Marland Kitchen... 1 by mouth daily    Glipizide 5 Mg Tabs (Glipizide) .Marland Kitchen... 1 by mouth daily  Orders: A1C-[facility] (86578) [facility]- Est  Level 4 (46962)  Labs Reviewed: Creat: 0.91 (01/14/2009)    Reviewed HgBA1c results: 6.1 (Jul 09, 2010)  7.3 (06/18/2009)  Problem # 2:  HYPERTENSION, BENIGN SYSTEMIC (ICD-401.1) Assessment: Deteriorated  Restart meds pt to call back with meds he takes, RTC 1 month for follow-up His updated medication list for this problem includes:    Lotrel 10-20 Mg Caps (Amlodipine besy-benazepril hcl) .Marland Kitchen... 1 by mouth daily    Bystolic 10 Mg Tabs (Nebivolol hcl) .Marland Kitchen... 1 by mouth daily  Orders: [facility]- Est  Level 4 (99214)  Problem # 3:  HYPERCHOLESTEROLEMIA (ICD-272.0) Assessment: Unchanged check labs and lipid panel His updated medication list for this problem includes:    Vytorin 10-40 Mg Tabs (Ezetimibe-simvastatin) .Marland Kitchen... 1 by mouth daily  Orders: Lipid-[facility] (40981-19147) Comp Met-[facility] (82956-21308) [facility]- Est  Level 4 (65784)  Complete Medication List: 1)  Neurontin 800 Mg Tabs (Gabapentin) .Marland Kitchen.. 1 by mouth twice daily 2)  Metformin Hcl 500 Mg Tabs (Metformin hcl) .Marland Kitchen.. 1 by mouth twice daily 3)  Lotrel 10-20 Mg Caps (Amlodipine besy-benazepril hcl) .Marland Kitchen.. 1 by mouth daily 4)  Vytorin 10-40 Mg Tabs (Ezetimibe-simvastatin) .Marland Kitchen.. 1 by  mouth daily 5)  Omeprazole 40 Mg Cpdr (Omeprazole) .Marland Kitchen.. 1 by mouth daily 6)  Plavix 75 Mg Tabs (Clopidogrel bisulfate) .Marland Kitchen.. 1 by mouth daily 7)  Nabumetone 500 Mg Tabs (Nabumetone) .Marland Kitchen.. 1 by mouth daily 8)  Wellbutrin Xl 150 Mg Tb24 (Bupropion hcl) .Marland Kitchen.. 1 by mouth daily 9)  Glipizide 5 Mg Tabs (Glipizide) .Marland Kitchen.. 1 by mouth daily 10)  Blood Glucose Meter Kit (Blood glucose monitoring suppl) .... Check blood sugar daily, sometimes in am before eating, and sometimes 2 hours after a meal, and record 11)  Pletal 100 Mg Tabs (Cilostazol) .Marland Kitchen.. 1 by mouth two times a day 12)  Bystolic 10 Mg Tabs (Nebivolol hcl) .Marland Kitchen.. 1 by mouth daily  Patient Instructions: 1)  Call back and ask to speak to Red Team nurse [phone] 2)  With your medications and I will refill them 3)  I will send a letter in the mail 4)  Return in 1 month to follow-up your blood pressure  Prescriptions: GLIPIZIDE 5 MG TABS (GLIPIZIDE) 1 by mouth daily  #30 x 3   Entered and Authorized by:   Milinda Antis MD   Signed by:   Milinda Antis MD on 07/01/2010   Method used:   [facility] to        [name] [address] [phone]* (retail)       [address]       [place], [place]  [address]       Ph: [phone] or [phone]       Fax: [phone]   RxID:   [id number] WELLBUTRIN XL 150 MG  TB24 (BUPROPION HCL) 1 by mouth daily  #30 Tablet x 3   Entered and Authorized by:   Milinda Antis MD   Signed by:   Milinda Antis MD on 07/01/2010   Method used:   Electronically to        [facility] [address] [phone]* (retail)       [address]       [place], [place]  [address]       Ph: [phone] or [phone]       Fax: [phone]   RxID:   [id number] NABUMETONE 500 MG  TABS (NABUMETONE) 1 by mouth daily  #30 x 3   Entered and Authorized by:   Milinda Antis MD   Signed by:   Milinda Antis MD on 07/01/2010   Method used:   Electronically to        [facility]  [place]  [address] [phone]* (retail)       [address]       [place], [place]  [address]       Ph: [phone] or [phone]  Fax: [phone]   RxID:   [id number] OMEPRAZOLE 40 MG CPDR (OMEPRAZOLE) 1 by mouth daily  #30 x 3   Entered and Authorized by:   Milinda Antis MD   Signed by:   Milinda Antis MD on 07/01/2010   Method used:   Electronically to        [facility] [address] [phone]* (retail)       [address]       [place], [place]  [address]       Ph: [phone] or [phone]       Fax: [phone]   RxID:   [id number] VYTORIN 10-40 MG  TABS (EZETIMIBE-SIMVASTATIN) 1 by mouth daily  #30 x 3   Entered and Authorized by:   Milinda Antis MD   Signed by:   Milinda Antis MD on 07/01/2010   Method used:   Electronically to        [facility] [address] [phone]* (retail)       [address]       [place], [place]  [address]       Ph: [phone] or [phone]       Fax: [phone]   RxID:   [id number] LOTREL 10-20 MG  CAPS (AMLODIPINE BESY-BENAZEPRIL HCL) 1 by mouth daily  #30 Capsule x 3   Entered and Authorized by:   Milinda Antis MD   Signed by:   Milinda Antis MD on 07/01/2010   Method used:   Electronically to        [facility] [address] [phone]* (retail)       [address]       [place], [place]  [address]       Ph: [phone] or [phone]       Fax: [phone]   RxID:   [id number] METFORMIN HCL 500 MG  TABS (METFORMIN HCL) 1 by mouth twice daily  #60 Tablet x 3   Entered and Authorized by:   Milinda Antis MD   Signed by:   Milinda Antis MD on 07/01/2010   Method used:   Electronically to        [facility] [address] [phone]* (retail)       [address]       [place], [place]  [address]       Ph: [phone] or [phone]       Fax: [phone]   RxID:   [id number]    Prevention & Chronic Care Immunizations    Influenza vaccine: Fluvax MCR  (11/11/2009)   Influenza vaccine due: 10/30/2008    Tetanus booster: 12/26/2001: Done.   Tetanus booster due: 12/27/2011    Pneumococcal vaccine: Not documented  Colorectal Screening   Hemoccult: Not documented   Hemoccult due: Not Indicated    Colonoscopy: Done.  (10/01/2006)   Colonoscopy due: 10/01/2016  Other Screening   PSA: Not documented   Smoking status: current  (07/01/2010)  Diabetes Mellitus   HgbA1C: 6.1  (07/01/2010)   Hemoglobin A1C due: 03/30/2008    Eye exam: Not documented    Foot exam: Not documented   High risk foot: Not documented   Foot care education: Not documented    Urine microalbumin/creatinine ratio: Not documented    Diabetes flowsheet reviewed?: Yes   Progress toward A1C goal: Improved  Lipids   Total Cholesterol: 111  (09/04/2007)   LDL: 35  (09/04/2007)   LDL Direct: Not documented   HDL: 36  (09/04/2007)   Triglycerides: 201  (09/04/2007)    SGOT (AST): 17  (01/14/2009)   SGPT (ALT): 12  (01/14/2009) CMP ordered    Alkaline phosphatase: 79  (01/14/2009)   Total bilirubin: 0.3  (01/14/2009)    Lipid flowsheet reviewed?: Yes  Hypertension   Last Blood Pressure: 169 / 92  (07/01/2010)   Serum creatinine: 0.91  (01/14/2009)   Serum potassium 4.2  (01/14/2009) CMP ordered     Hypertension flowsheet reviewed?: Yes   Progress toward BP goal: Deteriorated  Self-Management Support :   Personal Goals (by the next clinic visit) :     Personal A1C goal: 7  (07/01/2010)     Personal blood pressure goal: 130/80  (07/01/2010)     Personal LDL goal: 70  (07/01/2010)    Diabetes self-management support: Not documented    Hypertension self-management support: Not documented    Lipid self-management support: Not documented   Laboratory Results   Blood Tests   Date/Time Received: July 01, 2010 11:03 AM  Date/Time Reported: July 01, 2010 11:58 AM   HGBA1C: 6.1%   (Normal Range: Non-Diabetic -  3-6%   Control Diabetic - 6-8%)  Comments: ...............test performed by......Marland KitchenBonnie A. Swaz[place], MLS (ASCP)cm

## 2011-01-25 NOTE — Letter (Signed)
Summary[facility]   Imported By: Freddy Jaksch 08/08/2007 12:08:13  _____________________________________________________________________  External Attachment:    Type:   Image     Comment:   External Document

## 2011-01-25 NOTE — Consult Note (Signed)
Summary: [facility]  [facility]   Imported By: Clydell Hakim 09/09/2009 14:15:05  _____________________________________________________________________  External Attachment:    Type:   Image     Comment:   External Document

## 2011-01-25 NOTE — Consult Note (Signed)
[facility]   Imported By: De Nurse 11/03/2010 15:09:28  _____________________________________________________________________  External Attachment:    Type:   Image     Comment:   External Document

## 2011-01-25 NOTE — Assessment & Plan Note (Signed)
Summary: rx wp   Vital Signs:  Patient Profile:   60 Years Old Male Height:     66 inches Weight:      202.3 pounds BMI:     32.77 Temp:     97.8 degrees F oral Pulse rate:   118 / minute BP sitting:   124 / 79  (left arm) Cuff size:   large  Pt. in pain?   yes    Location:   legs    Intensity:   7  Vitals Entered By: Dedra Skeens CMA, (January 14, 2009 10:51 AM)                 Flex Sig Next Due:  Not Indicated Hemoccult Next Due:  Not Indicated   PCP:  Levander Campion MD  Chief Complaint:  GERD, prediabetes, HTN, CAD/PAD, and tobacco.  History of Present Illness: Patient presents after not being seen for about one year.  We discussed the following:  1. GERD: reports using prevacid with good results, but insurance company will no longer pay for this, will pay for omeprazole.  Patient is wanting to switch.  2. Prediabetes:  last visit was diagnosed with prediabetes based on fasting blood sugar of 115, and we started metformin 500 mg twice daily.  He was to return in 6 weeks to recheck this, but missed appointment.  Took metformin until prescription ran out, not currently taking.  Denies excessive thirst, urination, fatigue.    3. HTN:  Dr. Jacinto Halim manages his hypertension medications of norvasc and benazepril.  Patient reports good control.  4. CAD/PAD: Dr. Jacinto Halim manages meds and lipids.  Taking pletal, plavix, aspirin 325, and vytorin.   5. tobacco abuse: continues to smoke 1/2 ppd.  Wants to quit--thinking about trying patches, but reports these are expensive and insurance won't pay  6. health maintenance:  due for PSA        Physical Exam  General:     obese, NAD Lungs:     Normal respiratory effort, chest expands symmetrically. Lungs are clear to auscultation, no crackles or wheezes. Heart:     Normal rate and regular rhythm. S1 and S2 normal without gallop, murmur, click, rub or other extra sounds. Abdomen:     Bowel sounds positive,abdomen soft and  non-tender without masses, organomegaly or hernias noted. Extremities:     No clubbing, cyanosis, edema, or deformity noted  Skin:     Intact without suspicious lesions or rashes    Impression & Recommendations:  Problem # 1:  GERD (ICD-530.81) Assessment: Unchanged insurance will no longer pay for prevacid, will change to omeprazole His updated medication list for this problem includes:    Omeprazole 40 Mg Cpdr (Omeprazole) .Marland Kitchen... 1 by mouth daily  Orders: [facility]- Est  Level 4 ([address]11)   Problem # 2:  PREDIABETES (ICD-790.29) Assessment: Deteriorated A1C 6.7 today, which is in controlled diabetic range. Will restart metformin, check renal function and fasting glucose today.  Patient is on ACEI, aspirin His updated medication list for this problem includes:    Metformin Hcl 500 Mg Tabs (Metformin hcl) .Marland Kitchen... 1 by mouth twice daily  Orders: A1C-[facility] (91478) [facility]- Est  Level 4 (29562)   Problem # 3:  PERIPHERAL VASCULAR DISEASE, UNSPEC. (ICD-440.21) Assessment: Comment Only managed by Dr. Jacinto Halim.  Problem # 4:  TOBACCO DEPENDENCE (ICD-305.1) Assessment: Unchanged encouraged cessation Orders: [facility]- Est  Level 4 (99214)   Problem # 5:  SPECIAL SCREENING MALIGNANT NEOPLASM OF PROSTATE (ICD-V76.44) Assessment: Comment  Only will check PSA today  Complete Medication List: 1)  Neurontin 400 Mg Caps (Gabapentin) .... Take 1 capsule by mouth three times a day 2)  Neurontin 800 Mg Tabs (Gabapentin) .Marland Kitchen.. 1 by mouth three times daily 3)  Metformin Hcl 500 Mg Tabs (Metformin hcl) .Marland Kitchen.. 1 by mouth twice daily 4)  Lotrel 10-20 Mg Caps (Amlodipine besy-benazepril hcl) .Marland Kitchen.. 1 by mouth daily 5)  Vytorin 10-40 Mg Tabs (Ezetimibe-simvastatin) .Marland Kitchen.. 1 by mouth daily 6)  Omeprazole 40 Mg Cpdr (Omeprazole) .Marland Kitchen.. 1 by mouth daily 7)  Plavix 75 Mg Tabs (Clopidogrel bisulfate) .Marland Kitchen.. 1 by mouth daily 8)  Nabumetone 500 Mg Tabs (Nabumetone) .Marland Kitchen.. 1 by mouth daily 9)  Wellbutrin Xl 150 Mg Tb24 (Bupropion  hcl) .Marland Kitchen.. 1 by mouth daily 10)  Viagra 50 Mg Tabs (Sildenafil citrate) .Marland Kitchen.. 1 by mouth 30 minutes before sexual activity  Other Orders: Comp Met-[facility] ([id number])   Patient Instructions: 1)  Please schedule a follow-up appointment in 3 months for prediabetes. 2)  Restart metformin. 3)  I will let you know your lab results from today--measure of blood sugar over 3 months, prostate screening, and kidney function. 4)  I will change your prescription for heartburn medicine to omeprazole.   Prescriptions: METFORMIN HCL 500 MG  TABS (METFORMIN HCL) 1 by mouth twice daily  #60 Tablet x 6   Entered and Authorized by:   Levander Campion MD   Signed by:   Levander Campion MD on 01/14/2009   Method used:   [facility]        [facility] [address] [phone]* (retail)       [address]       [place], [place]  [address]       Ph: [phone] or [phone]       Fax: [phone]   RxID:   [id number] OMEPRAZOLE 40 MG CPDR (OMEPRAZOLE) 1 by mouth daily  #30 x 11   Entered and Authorized by:   Levander Campion MD   Signed by:   Levander Campion MD on 01/14/2009   Method used:   [facility]        [facility] [address] [phone]* (retail)       [address]       [place], [place]  [address]       Ph: [phone] or [phone]       Fax: [phone]   RxID:   [id number]   Laboratory Results   Blood Tests   Date/Time Received: January 14, 2009 12:12 PM  Date/Time Reported: January 14, 2009 12:24 PM   HGBA1C: 6.7%   (Normal Range: Non-Diabetic - 3-6%   Control Diabetic - 6-8%)  Comments: ...............test performed by......Marland KitchenBonnie A. [place], [place] (ASCP)      Appended Document: rx wp faxed

## 2011-01-25 NOTE — Assessment & Plan Note (Signed)
Summary: f/u,df   Vital Signs:  Patient profile:   60 year old male Height:      66 inches Weight:      200 pounds BMI:     32.40 Temp:     98.2 degrees F oral Pulse rate:   80 / minute Pulse rhythm:   regular BP sitting:   144 / 70  (right arm)  Vitals Entered By: Modesta Messing LPN (June 18, 2009 8:37 AM) CC: DM, smoking Is Patient Diabetic? No Pain Assessment Patient in pain? no        Primary Care Provider:  Levander Campion MD  CC:  DM and smoking.  History of Present Illness: Patient presents to discuss the following:   1. Prediabetes/DM: 1.5 years ago,t was diagnosed with prediabetes based on fasting blood sugar of 115, and we started metformin 500 mg twice daily.  He was to return in 6 weeks to recheck this, but missed appointment.  Took metformin until prescription ran out. Returned in January, A1C was 6.7, restarted metformin. No problems with metformin.  Was to return to discuss diabetes in detail in 3 months, but has not come in until today.  Not checking sugars.  Denies excessive thirst, urination, fatigue.    2. HTN:  Dr. Jacinto Halim manages his hypertension medications of norvasc and benazepril.  Patient reports good control.  3. CAD/PAD: Dr. Jacinto Halim manages meds and lipids.  Taking pletal, plavix, aspirin 325, and vytorin.   4. tobacco abuse: smokes 6 cigars per day.  Wants to quit.  Taking wellbutrin.  Going to go to a support group.  Also interested in seeing Dr. Raymondo Band.  5. health maintenance:  up to date on colonoscopy.  He thinks GI MD checks his PSA--will ask and let us know  Physical Exam  General:  obese, NAD Lungs:  Normal respiratory effort, chest expands symmetrically. Lungs are clear to auscultation, no crackles or wheezes. Heart:  Normal rate and regular rhythm. S1 and S2 normal without gallop, murmur, click, rub or other extra sounds. Pulses:  R radial normal and L radial normal.   Extremities:  No clubbing, cyanosis, edema, or deformity noted     Impression & Recommendations:  Problem # 1:  DIABETES MELLITUS, TYPE II (ICD-250.00) Assessment New has been taking metformin but A1C now 7.3.  Interested in changing diet habits, will refer to DM education center.  Continue metformin, add low dose glipizide, have patient start checking sugars.  May need to increase to two times a day, did not want to drop his sugars at night, very close to goal.  Patient will make eye MD appointment with Dr. Nile Riggs His updated medication list for this problem includes:    Metformin Hcl 500 Mg Tabs (Metformin hcl) .Marland Kitchen... 1 by mouth twice daily    Lotrel 10-20 Mg Caps (Amlodipine besy-benazepril hcl) .Marland Kitchen... 1 by mouth daily    Glipizide 5 Mg Tabs (Glipizide) .Marland Kitchen... 1 by mouth daily  Orders: A1C-[facility] (98119) Diabetic Clinic Referral (Diabetic) [facility]- Est  Level 4 (14782)  Problem # 2:  TOBACCO DEPENDENCE (ICD-305.1) Assessment: Unchanged  not sure if wellbutrin helps for cigars?  Patient will go to support group and to smoking cessation clinic.  Orders: [facility]- Est  Level 4 (99214)  Complete Medication List: 1)  Neurontin 400 Mg Caps (Gabapentin) .... Take 1 capsule by mouth three times a day 2)  Neurontin 800 Mg Tabs (Gabapentin) .Marland Kitchen.. 1 by mouth three times daily 3)  Metformin Hcl 500 Mg Tabs (  Metformin hcl) .Marland Kitchen.. 1 by mouth twice daily 4)  Lotrel 10-20 Mg Caps (Amlodipine besy-benazepril hcl) .Marland Kitchen.. 1 by mouth daily 5)  Vytorin 10-40 Mg Tabs (Ezetimibe-simvastatin) .Marland Kitchen.. 1 by mouth daily 6)  Omeprazole 40 Mg Cpdr (Omeprazole) .Marland Kitchen.. 1 by mouth daily 7)  Plavix 75 Mg Tabs (Clopidogrel bisulfate) .Marland Kitchen.. 1 by mouth daily 8)  Nabumetone 500 Mg Tabs (Nabumetone) .Marland Kitchen.. 1 by mouth daily 9)  Wellbutrin Xl 150 Mg Tb24 (Bupropion hcl) .Marland Kitchen.. 1 by mouth daily 10)  Viagra 50 Mg Tabs (Sildenafil citrate) .Marland Kitchen.. 1 by mouth 30 minutes before sexual activity 11)  Glipizide 5 Mg Tabs (Glipizide) .Marland Kitchen.. 1 by mouth daily 12)  Blood Glucose Meter Kit (Blood glucose monitoring suppl)  .... Check blood sugar daily, sometimes in am before eating, and sometimes 2 hours after a meal, and record  Patient Instructions: 1)  Please schedule a follow-up appointment in 3 months for diabetes check. 2)  Your A1C (measure of blood sugar over three months) was 7.3 today up from 6.7.  Our goal is to have it below 7.  We can do this through a combination of medicines and lifestyle changes you can learn at the diabetes center.   3)  Continue metformin, but also start glipizide 5 mg daily for blood sugar. 4)  Make an appointment in smoking cessation clinic with Dr. Raymondo Band. 5)  Make an appointment with Dr. Nile Riggs for diabetes eye exam. 6)  We will make the referral to the [facility]. 7)  Ask your GI doctor if she has checked your PSA for prostate cancer, if not, we can schedule a lab appointment for you. 8)  Keep up your exercise! 9)  [facility] Prescriptions: BLOOD GLUCOSE METER  KIT (BLOOD GLUCOSE MONITORING SUPPL) check blood sugar daily, sometimes in AM before eating, and sometimes 2 hours after a meal, and record  #1 x 0   Entered and Authorized by:   Levander Campion MD   Signed by:   Levander Campion MD on 06/18/2009   Method used:   [facility]ically to        [facility] [address] [phone]* (retail)       [address]       [place], [place]  [address]       Ph: [phone] or [phone]       Fax: [phone]   RxID:   [id number] GLIPIZIDE 5 MG TABS (GLIPIZIDE) 1 by mouth daily  #30 x 3   Entered and Authorized by:   Levander Campion MD   Signed by:   Levander Campion MD on 06/18/2009   Method used:   [facility]ronically to        [facility] [address] [phone]* (retail)       [address]       [place], [place]  [address]       Ph: [phone] or [phone]       Fax: [phone]   RxID:   [id number] VIAGRA 50 MG  TABS (SILDENAFIL CITRATE) 1 by mouth 30 minutes before  sexual activity  #20 Tablet x 3   Entered and Authorized by:   Levander Campion MD   Signed by:   Levander Campion MD on 06/18/2009   Method used:   Electronically to        [facility] [address] [phone]* (retail)  [address]       [place], [place]  [address]       Ph: [phone] or [phone]       Fax: [phone]   RxID:   [id number] WELLBUTRIN XL 150 MG  TB24 (BUPROPION HCL) 1 by mouth daily  #30 x 1   Entered and Authorized by:   Levander Campion MD   Signed by:   Levander Campion MD on 06/18/2009   Method used:   Electronically to        [facility] [address] [phone]* (retail)       [address]       [place], [place]  [address]       Ph: [phone] or [phone]       Fax: [phone]   RxID:   [id number] NABUMETONE 500 MG  TABS (NABUMETONE) 1 by mouth daily  #30 x 3   Entered and Authorized by:   Levander Campion MD   Signed by:   Levander Campion MD on 06/18/2009   Method used:   Electronically to        [facility]  [facility] [address] [phone]* (retail)       [address]       [place], [place]  [address]       Ph: [phone] or [phone]       Fax: [phone]   RxID:   [id number] PLAVIX 75 MG  TABS (CLOPIDOGREL BISULFATE) 1 by mouth daily  #30 x 1   Entered and Authorized by:   Levander Campion MD   Signed by:   Levander Campion MD on 06/18/2009   Method used:   Electronically to        [facility] [address] [phone]* (retail)       [address]       [place], [place]  [address]       Ph: [phone] or [phone]       Fax: [phone]   RxID:   [id number] OMEPRAZOLE 40 MG CPDR (OMEPRAZOLE) 1 by mouth daily  #30 x 11   Entered and Authorized by:   Levander Campion MD   Signed by:   Levander Campion MD on 06/18/2009   Method used:   Electronically to        [facility]  [facility] [address] [phone]* (retail)       [address]       [place], [place]  [address]       Ph: [phone] or [phone]       Fax: [phone]   RxID:   [id number] VYTORIN 10-40 MG  TABS (EZETIMIBE-SIMVASTATIN) 1 by mouth daily  #30 x 6   Entered and Authorized by:   Levander Campion MD   Signed by:   Levander Campion MD on 06/18/2009   Method used:   Electronically to        [facility] [address] [phone]* (retail)       [address]       [place], [place]  [address]       Ph: [phone] or [phone]       Fax: [phone]   RxID:   [id number] LOTREL 10-20 MG  CAPS (AMLODIPINE BESY-BENAZEPRIL  HCL) 1 by mouth daily  #30 x 6   Entered and Authorized by:   Levander Campion MD   Signed by:   Levander Campion MD on 06/18/2009   Method used:   Electronically to        [facility] [address] [phone]* (retail)       [address]       [place], [place]  [address]       Ph: [phone] or [phone]       Fax: [phone]   RxID:   [id number] METFORMIN HCL 500 MG  TABS (METFORMIN HCL) 1 by mouth twice daily  #60 Tablet x 6   Entered and Authorized by:   Levander Campion MD   Signed by:   Levander Campion MD on 06/18/2009   Method used:   Electronically to        [facility]  [facility] [address] [phone]* (retail)       [address]       [place], [place]  [address]       Ph: [phone] or [phone]       Fax: [phone]   RxID:   [id number] NEURONTIN 800 MG  TABS (GABAPENTIN) 1 by mouth three times daily  #90 x 6   Entered and Authorized by:   Levander Campion MD   Signed by:   Levander Campion MD on 06/18/2009   Method used:   Electronically to        [facility]  [facility] [address] [phone]* (retail)       [address]       [place], [place]  [address]       Ph: [phone] or [phone]       Fax: [phone]   RxID:   [id number]     Laboratory Results   Blood Tests   Date/Time Received: June 18, 2009 8:41 AM  Date/Time Reported: June 18, 2009 9:13 AM   HGBA1C: 7.3%   (Normal Range: Non-Diabetic - 3-6%   Control Diabetic - 6-8%)  Comments: .................test performed by .....................Marland KitchenMarland KitchenArlyss Repress, CMA .............entered by...........Marland KitchenBonnie A. [place], [place] (ASCP)

## 2011-01-25 NOTE — Consult Note (Signed)
Summary: [facility]  [facility]   Imported By: Bradly Bienenstock 10/08/2008 09:43:26  _____________________________________________________________________  External Attachment:    Type:   Image     Comment:   External Document

## 2011-01-25 NOTE — Consult Note (Signed)
Summary: [facility]  [facility]   Imported By: Clydell Hakim 07/29/2009 11:49:11  _____________________________________________________________________  External Attachment:    Type:   Image     Comment:   External Document

## 2011-01-25 NOTE — Assessment & Plan Note (Signed)
Summary: FU/KH   Vital Signs:  Patient Profile:   59 Years Old Male Height:     66 inches Weight:      206.9 pounds Temp:     98.3 degrees F Pulse rate:   90 / minute BP sitting:   134 / 80  (left arm)  Pt. in pain?   no  Vitals Entered By: Theresia Lo RN (December 31, 2007 4:08 PM)              Is Patient Diabetic? No     PCP:  Levander Campion MD  Chief Complaint:  follow up on medication.  History of Present Illness: Mr. loper is here to follow up on starting metformin for prediabetes. He has been taking the medicine x one month without side effects. has not been checking blood sugars.  not watching his diet or exercising, but notes he does need to lose weight. denies fatigue, polyuria, polydipsia.    he notes that over the holiday he had difficulty getting an erection for the first time. denies lack of desire or relationship problems.        Risk Factors:     Physical Exam  General:     obese, NAD Lungs:     Normal respiratory effort, chest expands symmetrically. Lungs are clear to auscultation, no crackles or wheezes. Heart:     Normal rate and regular rhythm. S1 and S2 normal without gallop, murmur, click, rub or other extra sounds. Abdomen:     Bowel sounds positive,abdomen soft and non-tender without masses, organomegaly or hernias noted. Extremities:     No clubbing, cyanosis, edema, or deformity noted     Impression & Recommendations:  Problem # 1:  PREDIABETES (ICD-790.29) Assessment: Improved continue metformin. check AIC in 6 weeks.  encouraged diet and exercise for weight loss His updated medication list for this problem includes:    Metformin Hcl 500 Mg Tabs (Metformin hcl) .Marland Kitchen... 1 by mouth twice daily  Orders: A1C-[facility] (04540) [facility]- Est  Level 4 (98119)   Problem # 2:  ERECTILE DYSFUNCTION (ICD-302.72) Assessment: New trial of viagra His updated medication list for this problem includes:    Viagra 50 Mg Tabs (Sildenafil citrate)  .Marland Kitchen... 1 by mouth 30 minutes before sexual activity   Complete Medication List: 1)  Neurontin 400 Mg Caps (Gabapentin) .... Take 1 capsule by mouth three times a day 2)  Neurontin 800 Mg Tabs (Gabapentin) .Marland Kitchen.. 1 by mouth three times daily 3)  Metformin Hcl 500 Mg Tabs (Metformin hcl) .Marland Kitchen.. 1 by mouth twice daily 4)  Lotrel 10-20 Mg Caps (Amlodipine besy-benazepril hcl) .Marland Kitchen.. 1 by mouth daily 5)  Vytorin 10-40 Mg Tabs (Ezetimibe-simvastatin) .Marland Kitchen.. 1 by mouth daily 6)  Prevacid 30 Mg Cpdr (Lansoprazole) .Marland Kitchen.. 1 by mouth daily 7)  Plavix 75 Mg Tabs (Clopidogrel bisulfate) .Marland Kitchen.. 1 by mouth daily 8)  Nabumetone 500 Mg Tabs (Nabumetone) .Marland Kitchen.. 1 by mouth daily 9)  Wellbutrin Xl 150 Mg Tb24 (Bupropion hcl) .Marland Kitchen.. 1 by mouth daily 10)  Viagra 50 Mg Tabs (Sildenafil citrate) .Marland Kitchen.. 1 by mouth 30 minutes before sexual activity   Patient Instructions: 1)  Please schedule a follow-up appointment in 2 months. 2)  Your hemoglobin AIC (measure of your average blood sugars over the past 3 months) is 6.9.  Your goal is less than 6.5. 3)  Continue the metformin, and we will check this again in 2 months.    Prescriptions: VIAGRA 50 MG  TABS (SILDENAFIL CITRATE) 1  by mouth 30 minutes before sexual activity  #20 x 6   Entered and Authorized by:   Levander Campion MD   Signed by:   Levander Campion MD on 12/31/2007   Method used:   Print then Give to Patient   RxID:   [id number]  ]  Vital Signs:  Patient Profile:   60 Years Old Male Height:     66 inches Weight:      206.9 pounds Temp:     98.3 degrees F Pulse rate:   90 / minute BP sitting:   134 / 80               Laboratory Results   Blood Tests   Date/Time Received: December 31, 2007 4:45 PM  Date/Time Reported: December 31, 2007 4:57 PM   HGBA1C: 6.9%   (Normal Range: Non-Diabetic - 3-6%   Control Diabetic - 6-8%)  Comments: ...............test performed by......Marland KitchenBonnie A. [place], [place] (ASCP)

## 2011-01-25 NOTE — Miscellaneous (Signed)
Summary: Diabetic supplies  Clinical Lists Changes    Request for diabetic supplies completed [facility] MD  March 24, 2010 3:04 PM

## 2011-01-25 NOTE — Letter (Signed)
Summary: Generic Letter  [name]  [address]   [place], [place] [address]   Phone: [phone]  Fax: [phone]    01/15/2009  [name] [address] [place], [place]  [address]  Dear [name],  Your hemoglobin A1C (measure of your blood sugar over 3 months) was 6.7.  Less than 6 is non-diabetic.  You are in the "controlled diabetic" range of 6.0-7.0. Definitely continue to take the the metformin, and we will recheck this in 3 months.  Your kidney function was normal.         Sincerely,   Levander Campion MD [name]  Appended Document: Generic Letter sent

## 2011-01-25 NOTE — Consult Note (Signed)
Summary: [facility]  [facility]   Imported By: Denny Peon LEVAN 04/06/2007 09:53:49  _____________________________________________________________________  External Attachment:    Type:   Image     Comment:   External Document

## 2011-01-25 NOTE — Consult Note (Signed)
Summary: [facility]  [facility]   Imported By: Clydell Hakim 03/18/2010 12:08:21  _____________________________________________________________________  External Attachment:    Type:   Image     Comment:   External Document

## 2011-01-25 NOTE — Consult Note (Signed)
Summary: [facility]  [facility]   Imported By: Haydee Salter 10/16/2007 14:39:49  _____________________________________________________________________  External Attachment:    Type:   Image     Comment:   External Document

## 2011-01-25 NOTE — Consult Note (Signed)
Summary: [facility]   [facility]   Imported By: Bradly Bienenstock 12/22/2009 15:43:21  _____________________________________________________________________  External Attachment:    Type:   Image     Comment:   External Document

## 2011-01-25 NOTE — Consult Note (Signed)
Summary: [facility]  [facility]   Imported By: Haydee Salter 11/11/2008 16:14:47  _____________________________________________________________________  External Attachment:    Type:   Image     Comment:   External Document

## 2011-01-25 NOTE — Assessment & Plan Note (Signed)
Summary: DNKA   DPG   

## 2011-01-25 NOTE — Miscellaneous (Signed)
  Clinical Lists Changes Peripheral arteriogram (Dr. Jacinto Halim) 03/07/07 6.0x60 Exceed Stent placed in the R superficial femoral artery  [place]

## 2011-01-25 NOTE — Assessment & Plan Note (Signed)
Summary: flu shot/hartman/wp  Nurse Visit     Risk Factors:  Colonoscopy History:     Date of Last Colonoscopy:  10/01/2006  Prior Medications: NEURONTIN 400 MG CAPS (GABAPENTIN) Take 1 capsule by mouth three times a day NEURONTIN 800 MG  TABS (GABAPENTIN) 1 by mouth three times daily METFORMIN HCL 500 MG  TABS (METFORMIN HCL) 1 by mouth twice daily LOTREL 10-20 MG  CAPS (AMLODIPINE BESY-BENAZEPRIL HCL) 1 by mouth daily VYTORIN 10-40 MG  TABS (EZETIMIBE-SIMVASTATIN) 1 by mouth daily PREVACID 30 MG  CPDR (LANSOPRAZOLE) 1 by mouth daily PLAVIX 75 MG  TABS (CLOPIDOGREL BISULFATE) 1 by mouth daily NABUMETONE 500 MG  TABS (NABUMETONE) 1 by mouth daily WELLBUTRIN XL 150 MG  TB24 (BUPROPION HCL) 1 by mouth daily VIAGRA 50 MG  TABS (SILDENAFIL CITRATE) 1 by mouth 30 minutes before sexual activity    Influenza Vaccine    Vaccine Type: Fluvax Non-MCR    Site: right deltoid    Mfr: GlaxoSmithKline    Dose: 0.5 ml    Route: IM    Given by: JESSICA FLEEGER CMA    Exp. Date: 06/24/2009    Lot #: [id number]    VIS given: 07/19/07 version given October 23, 2008.  Flu Vaccine Consent Questions    Do you have a history of severe allergic reactions to this vaccine? no    Any prior history of allergic reactions to egg and/or gelatin? no    Do you have a sensitivity to the preservative Thimersol? no    Do you have a past history of Guillan-Barre Syndrome? no    Do you currently have an acute febrile illness? no    Have you ever had a severe reaction to latex? no    Vaccine information given and explained to patient? yes   Orders Added: 1)  Influenza Vaccine NON MCR [00028] 2)  Est Level 1- [facility] [[address]11]    ]

## 2011-01-25 NOTE — Progress Notes (Signed)
Summary: status of rx  Phone Note Call from Patient Call back at Home Phone [phone]   Reason for Call: Talk to Nurse Summary of Call: pt sts MD is suppose to call something into the pharmacy for his high blood sugar but nothing has been called in yet, [facility] Initial call taken by: ERIN LEVAN,  November 28, 2007 2:20 PM      Prescriptions: METFORMIN HCL 500 MG  TABS (METFORMIN HCL) 1 by mouth twice daily  #60 x 3   Entered and Authorized by:   Levander Campion MD   Signed by:   Levander Campion MD on 11/30/2007   Method used:   Electron[facility] ...       [facility] [address] [phone]*       [address]       [place], [place]  [address]       Ph: [phone] or [phone]       Fax: [phone]   RxID:   [id number]

## 2011-01-25 NOTE — Letter (Signed)
Summary: Lipid Letter  [facility]  [address]   [place], [place] [address]   Phone: [phone]  Fax: [phone]    07/01/2010  [name] [address] [place], [place]  [address]  Dear [name]:  We have carefully reviewed your last lipid profile from 07/01/2010 and the results are noted below with a summary of recommendations for lipid management.    Cholesterol:       153     Goal: < 200   HDL "good" Cholesterol:   44     Goal: > 35   LDL "bad" Cholesterol:   55     Goal: < 100   Triglycerides:       271     Goal: < 150     Your Complete Metabolic Panel- checked your electrolytes, kidneys and liver function were NORMAL.  Continue to watch the fatty foods, your fatty acids are still high. No change to your medications as this time.     Current Medications: 1)    Neurontin 800 Mg  Tabs (Gabapentin) .Marland Kitchen.. 1 by mouth twice daily 2)    Metformin Hcl 500 Mg  Tabs (Metformin hcl) .Marland Kitchen.. 1 by mouth twice daily 3)    Lotrel 10-20 Mg  Caps (Amlodipine besy-benazepril hcl) .Marland Kitchen.. 1 by mouth daily 4)    Vytorin 10-40 Mg  Tabs (Ezetimibe-simvastatin) .Marland Kitchen.. 1 by mouth daily 5)    Omeprazole 40 Mg Cpdr (Omeprazole) .Marland Kitchen.. 1 by mouth daily 6)    Plavix 75 Mg  Tabs (Clopidogrel bisulfate) .Marland Kitchen.. 1 by mouth daily 7)    Nabumetone 500 Mg  Tabs (Nabumetone) .Marland Kitchen.. 1 by mouth daily 8)    Wellbutrin Xl 150 Mg  Tb24 (Bupropion hcl) .Marland Kitchen.. 1 by mouth daily 9)    Glipizide 5 Mg Tabs (Glipizide) .Marland Kitchen.. 1 by mouth daily 10)    Blood Glucose Meter  Kit (Blood glucose monitoring suppl) .... Check blood sugar daily, sometimes in am before eating, and sometimes 2 hours after a meal, and record 11)    Pletal 100 Mg Tabs (Cilostazol) .Marland Kitchen.. 1 by mouth two times a day 12)    Bystolic 10 Mg Tabs (Nebivolol hcl) .Marland Kitchen.. 1 by mouth daily  If you have any questions, please call. We appreciate being able to work with you.   Sincerely,    [facility] Milinda Antis MD  Appended  Document: Lipid Letter mailed

## 2011-01-25 NOTE — Assessment & Plan Note (Signed)
Summary: f/u,df   Vital Signs:  Patient profile:   60 year old male Height:      66 inches Weight:      201 pounds BMI:     32.56 Temp:     98.2 degrees F oral Pulse rate:   103 / minute BP sitting:   130 / 79  (left arm) Cuff size:   regular  Vitals Entered By: Arlyss Repress CMA, (November 09, 2010 2:37 PM) CC: F/U BP, pain in neck Is Patient Diabetic? Yes Pain Assessment Patient in pain? yes     Location: neck, shoulders, feet Intensity: 7   Primary Care Provider:  Levander Campion MD  CC:  F/U BP and pain in neck.  History of Present Illness:   Here to f/u  HTN, neck pain- meds not helping    HTN- Taking all meds as prescribed, no chest pain, no dizziness, no SOB, does not have BP cuff at home. 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 No longer sees neurosurgery   Habits & Providers  Alcohol-Tobacco-Diet     Tobacco Status: current     Tobacco Counseling: to quit use of tobacco products     Other Tobacco cigar     Other per week 7  Current Medications (verified): 1)  Neurontin 800 Mg  Tabs (Gabapentin) .Marland Kitchen.. 1 By Mouth Twice Daily 2)  Metformin Hcl 500 Mg  Tabs (Metformin Hcl) .Marland Kitchen.. 1 By Mouth Twice Daily 3)  Lotrel 10-20 Mg  Caps (Amlodipine Besy-Benazepril Hcl) .Marland Kitchen.. 1 By Mouth Daily 4)  Vytorin 10-40 Mg  Tabs (Ezetimibe-Simvastatin) .Marland Kitchen.. 1 By Mouth Daily 5)  Pantoprazole Sodium 40 Mg Tbec (Pantoprazole Sodium) .Marland Kitchen.. 1 By Mouth Daily For Heartburn 6)  Plavix 75 Mg  Tabs (Clopidogrel Bisulfate) .Marland Kitchen.. 1 By Mouth Daily 7)  Nabumetone 500 Mg  Tabs (Nabumetone) .Marland Kitchen.. 1 By Mouth Daily 8)  Wellbutrin Xl 150 Mg  Tb24 (Bupropion Hcl) .Marland Kitchen.. 1 By Mouth Daily 9)  Glipizide 5 Mg Tabs (Glipizide) .Marland Kitchen.. 1 By Mouth Daily 10)  Blood Glucose Meter  Kit (Blood  Glucose Monitoring Suppl) .... Check Blood Sugar Daily, Sometimes in Am Before Eating, and Sometimes 2 Hours After A Meal, and Record 11)  Pletal 100 Mg Tabs (Cilostazol) .Marland Kitchen.. 1 By Mouth Two Times A Day 12)  Bystolic 20 Mg Tabs (Nebivolol Hcl) .Marland Kitchen.. 1 By Mouth Daily For High Blood Pressure  Allergies (verified): No Known Drug Allergies  Past History:  Past Surgical History: Back surgery - 89 & 99 - 11/30/2004, Neck surgery - 2005 --  cardiolite 9/06- no ischemia,  diaphragmatic attenuation artifact - 09/16/2005,  stent of right SFA 12/06 - 01/31/2006  Physical Exam  General:  Vital signs noted NAD alert and oriented Neck:  ROM stiff with rotation to left and right, otherwise normal Neck TTP on right lateral aspect no masses felt, no erythema no lesions Lungs:  CTAB Heart:  RRR, no murmur, no gallop Msk:  cervical spine non tender no muscle atrophy of Upper ext Pulses:  radial pulses 2+ Neurologic:  sensation grossly in tact in upper ex    Impression & Recommendations:  Problem # 1:  HYPERTENSION, BENIGN SYSTEMIC (ICD-401.1) Assessment Improved   Continue current meds His updated medication list for  this problem includes:    Lotrel 10-20 Mg Caps (Amlodipine besy-benazepril hcl) .Marland Kitchen... 1 by mouth daily    Bystolic 20 Mg Tabs (Nebivolol hcl) .Marland Kitchen... 1 by mouth daily for high blood pressure  Orders: [facility]- Est  Level 4 (99214)  Problem # 2:  OSTEOARTHRITIS, MULTI SITES (ICD-715.98) Assessment: Deteriorated  May be secondary to weather change, no red flags on neck exam today, pt on low dose NSAID, would prefer not to push dosing with other combordities as he has been on for maintanace add ultram, consider D/C if ultram works better His updated medication list for this problem includes:    Nabumetone 500 Mg Tabs (Nabumetone) .Marland Kitchen... 1 by mouth daily    Ultram 50 Mg Tabs (Tramadol hcl) .Marland Kitchen... 1 by mouth two times a day as needed pain  Orders: [facility]- Est  Level 4 ([address]214)  Problem #  3:  TOBACCO DEPENDENCE (ICD-305.1) Assessment: Unchanged  couseled to quit, discussed effects on his PVD, HTN  Orders: [facility]- Est  Level 4 ([address])  Complete Medication List: 1)  Neurontin 800 Mg Tabs (Gabapentin) .Marland Kitchen.. 1 by mouth twice daily 2)  Metformin Hcl 500 Mg Tabs (Metformin hcl) .Marland Kitchen.. 1 by mouth twice daily 3)  Lotrel 10-20 Mg Caps (Amlodipine besy-benazepril hcl) .Marland Kitchen.. 1 by mouth daily 4)  Vytorin 10-40 Mg Tabs (Ezetimibe-simvastatin) .Marland Kitchen.. 1 by mouth daily 5)  Pantoprazole Sodium 40 Mg Tbec (Pantoprazole sodium) .Marland Kitchen.. 1 by mouth daily for heartburn 6)  Plavix 75 Mg Tabs (Clopidogrel bisulfate) .Marland Kitchen.. 1 by mouth daily 7)  Nabumetone 500 Mg Tabs (Nabumetone) .Marland Kitchen.. 1 by mouth daily 8)  Wellbutrin Xl 150 Mg Tb24 (Bupropion hcl) .Marland Kitchen.. 1 by mouth daily 9)  Glipizide 5 Mg Tabs (Glipizide) .Marland Kitchen.. 1 by mouth daily 10)  Blood Glucose Meter Kit (Blood glucose monitoring suppl) .... Check blood sugar daily, sometimes in am before eating, and sometimes 2 hours after a meal, and record 11)  Pletal 100 Mg Tabs (Cilostazol) .Marland Kitchen.. 1 by mouth two times a day 12)  Bystolic 20 Mg Tabs (Nebivolol hcl) .Marland Kitchen.. 1 by mouth daily for high blood pressure 13)  Ultram 50 Mg Tabs (Tramadol hcl) .Marland Kitchen.. 1 by mouth two times a day as needed pain  Patient Instructions: 1)  Use the ultram as needed for pain twice a day, try at night first 2)  Try ice or heat 20 minutes after any work out 3)  Continue the [employer]  4)  Continue your other medications 5)  Your blood pressure looks great  6)  Next visit 6months  Prescriptions: BYSTOLIC 20 MG TABS (NEBIVOLOL HCL) 1 by mouth daily for high blood pressure  #30 x 3   Entered and Authorized by:   Milinda Antis MD   Signed by:   Milinda Antis MD on 11/09/2010   Method used:   [facility]        [facility] [address] [phone]* (retail)       [address]       [place], [place]  [address]       Ph: [phone] or [phone]       Fax:  [phone]   RxID:   [id number] ULTRAM 50 MG TABS (TRAMADOL HCL) 1 by mouth two times a day as needed pain  #60 x 3   Entered and Authorized by:   Milinda Antis MD   Signed by:   Milinda Antis MD on 11/09/2010   Method used:  Electronically to        [facility] [address] [phone]* (retail)       [address]       [place], [place]  [address]       Ph: [phone] or [phone]       Fax: [phone]   RxID:   [id number]    Orders Added: 1)  [facility]- Est  Level 4 [[address]]     Prevention & Chronic Care Immunizations   Influenza vaccine: Fluvax Non-MCR  (10/01/2010)   Influenza vaccine due: 10/30/2008    Tetanus booster: 12/26/2001: Done.   Tetanus booster due: 12/27/2011    Pneumococcal vaccine: Pneumovax  (10/01/2010)    H. zoster vaccine: Not documented  Colorectal Screening   Hemoccult: Not documented   Hemoccult action/deferral: Not indicated  (11/09/2010)   Hemoccult due: Not Indicated    Colonoscopy: Done.  (10/01/2006)   Colonoscopy due: 10/01/2016  Other Screening   PSA: Not documented   Smoking status: current  (11/09/2010)  Diabetes Mellitus   HgbA1C: 6.3  (10/01/2010)   Hemoglobin A1C due: 03/30/2008    Eye exam: Not documented    Foot exam: Not documented   High risk foot: Not documented   Foot care education: Not documented    Urine microalbumin/creatinine ratio: Not documented    Diabetes flowsheet reviewed?: Yes   Progress toward A1C goal: At goal  Lipids   Total Cholesterol: 153  (07/01/2010)   LDL: 55  (07/01/2010)   LDL Direct: Not documented   HDL: 44  (07/01/2010)   Triglycerides: 271  (07/01/2010)    SGOT (AST): 21  (07/01/2010)   SGPT (ALT): 17  (07/01/2010)   Alkaline phosphatase: 66  (07/01/2010)   Total bilirubin: 0.3  (07/01/2010)  Hypertension   Last Blood Pressure: 130 / 79  (11/09/2010)   Serum creatinine: 0.82  (07/01/2010)   Serum potassium 4.4  (07/01/2010)    Hypertension  flowsheet reviewed?: Yes   Progress toward BP goal: At goal  Self-Management Support :   Personal Goals (by the next clinic visit) :     Personal A1C goal: 7  (07/01/2010)     Personal blood pressure goal: 130/80  (07/01/2010)     Personal LDL goal: 70  (07/01/2010)    Diabetes self-management support: Not documented    Hypertension self-management support: Not documented    Lipid self-management support: Not documented     Past Surgical History:    Back surgery - 98 & 99 - 11/30/2004,    Neck surgery - 2005 --     cardiolite 9/06- no ischemia,     diaphragmatic attenuation artifact - 09/16/2005,     stent of right SFA 12/06 - 01/31/2006

## 2011-01-25 NOTE — Progress Notes (Signed)
----   Converted from flag ---- ---- 05/03/2010 1:36 PM, Milinda Antis MD wrote: Please have patient schedule an office visit. I can not continue to refill his medications as I have not seen him. His last office visit was June 2010. I will give 1 month on each medication. ------------------------------  called  pt and delivered MD message.

## 2011-01-25 NOTE — Consult Note (Signed)
Summary: [facility]  [facility]   Imported By: Clydell Hakim 07/03/2009 10:53:21  _____________________________________________________________________  External Attachment:    Type:   Image     Comment:   External Document

## 2011-01-25 NOTE — Progress Notes (Signed)
Summary: REFERRAL APPT  Phone Note From Other Clinic Call back at [phone]   Caller: SUSAN @ [facility] Summary of Call: RETURNING CALL TO [facility]Y.  APPT SCHEDULED FOR 9/24.  PT NEEDS TO BE THERE @ 11:30 AND BRING MRI SCAN.  WILL NEED TO NOTIFY PT OF APPT. Initial call taken by: Levada Schilling,  September 04, 2007 2:00 PM  Follow-up for Phone Call        Pt notified of appt.  Follow-up by: AMY MARTIN RN,  September 04, 2007 5:16 PM

## 2011-01-25 NOTE — Consult Note (Signed)
Summary: [facility]  [facility]   Imported By: Haydee Salter 02/20/2009 14:59:46  _____________________________________________________________________  External Attachment:    Type:   Image     Comment:   External Document

## 2011-01-25 NOTE — Consult Note (Signed)
Summary: [facility]  [facility]   Imported By: Haydee Salter 01/05/2009 15:53:13  _____________________________________________________________________  External Attachment:    Type:   Image     Comment:   External Document

## 2011-01-25 NOTE — Progress Notes (Signed)
Summary: REQUEST FOR APPT  Phone Note From Other Clinic Call back at [phone]   Caller: DIANE @ [facility] Summary of Call: RETURNING CALL TO [facility] RE: APPT W/NEUROSURGEON.  NOTE. THEIR [facility] DOES NOT DO SURGERY.  NEED RETURN CALL TO CLARIFY Initial call taken by: Levada Schilling,  September 03, 2007 11:52 AM  Follow-up for Phone Call        Spoke with Dr. Luz Brazen - she does want pt to have neurosurgery instead of neurologist appt.  [facility] and cancelled pt's appt there, pt notified.  Will refer to [facility].  Follow-up by: AMY MARTIN RN,  September 04, 2007 8:46 AM

## 2011-01-25 NOTE — Assessment & Plan Note (Signed)
Summary: meet new doc/[facility]  Medications Added NEURONTIN 400 MG CAPS (GABAPENTIN) Take 1 capsule by mouth three times a day NEURONTIN 800 MG  TABS (GABAPENTIN) 1 by mouth three times daily         PCP:  Levander Campion MD  Chief Complaint:  neck pain.  History of Present Illness: [name] was diagnosed one year ago with cervical radiculopathy and treated with neurontin and flexeril with good results.  He returns today complaining of a worsening of symptoms similar to those he has had in the past over the past 2-3 months.  He has pain originating from the base of the right neck and has sharp shooting pains down his right arm all the way to his fingers.  he also has numbness and tingling in these fingers.  He cannot tell if it affects certain fingers more than others.  He has weakness in his right hand when grasping things.  He does not notice any weakness in his shoulders, biceps or triceps except for motion being limited by pain.  No difficulty breathing.  No fevers or chills.   No bowel or bladder incontinence.        Physical Exam  General:     Well-developed,well-nourished,in no acute distress; alert,appropriate and cooperative throughout examination Neck:     tender to palpation over base of neck on the right. range of motion limited by pain.  positive compression test of the cervical spine (reproduces pain and numbness and tingling) Lungs:     Normal respiratory effort, chest expands symmetrically. Lungs are clear to auscultation, no crackles or wheezes. Heart:     Normal rate and regular rhythm. S1 and S2 normal without gallop, murmur, click, rub or other extra sounds. Msk:     shoulder-normal range of motion and strength bilaterally.  impingement test positive.  no swelling or erythema.  no masses.  Pulses:     R radial normal and L radial normal.   Neurologic:     decreased grip strenngth and interosseus strength bilaterally, right >left   biceps, triceps, wrist strength  normal alert & oriented X3, cranial nerves II-XII intact, sensation intact to light touch, gait normal, and DTRs symmetrical and normal.      Impression & Recommendations:  Problem # 1:  CERVICAL RADICULOPATHY (ICD-723.4) Worsening symptoms with weakness concerning for worsening of compression.  Restart neurontin.  Repeat MRI.  refer to neuosurgeon. Orders: MRI (MRI) Neurosurgeon Referral (Neurosurgeon) [facility]- Est Level  3 ([address])   Complete Medication List: 1)  Neurontin 400 Mg Caps (Gabapentin) .... Take 1 capsule by mouth three times a day 2)  Neurontin 800 Mg Tabs (Gabapentin) .Marland Kitchen.. 1 by mouth three times daily  Other Orders: Future Orders: Comp Met-[facility] ([id number]) ... 08/30/2008 Lipid-[facility] ([id number]) ... 08/29/2008   Patient Instructions: 1)  Please schedule a follow-up appointment in 1 month. 2)  Schedule a fasting lab appointment to check your cholesterol and electrolytes. 3)  Continue current medications. 4)  We will let you know the results of your MRI. 5)  Amy will call you with your Neurosurgery appointment. 6)  Restart neurontin 400 mg three times daily.  After one week, increase to 800 mg three times daily if pain is not improved.    Prescriptions: NEURONTIN 400 MG CAPS (GABAPENTIN) Take 1 capsule by mouth three times a day  #30 x 0   Entered and Authorized by:   Levander Campion MD   Signed by:   Levander Campion MD on 08/30/2007  Method used:   Electron[facility] to ...       [facility] [phone] [address]       [address]       [place], [place]  [address]       Ph: [phone] or [phone]       Fax: [phone]   RxID:   [id number] NEURONTIN 800 MG  TABS (GABAPENTIN) 1 by mouth three times daily  #90 x 6   Entered and Authorized by:   Levander Campion MD   Signed by:   Levander Campion MD on 08/30/2007   Method used:   Electronically sent to ...       [facility] [phone] [facility] [address]       [address]        [place], [place]  [address]       Ph: [phone] or [phone]       Fax: [phone]   RxID:   [id number]    Scheduled pt for MRI for today at 6pm - pt notified of appt before leaving office today. Scheduled pt an appt with Dr. Thad Ranger at [facility] for 09/10/07 at 8am, pt notified of appt via phone. ..................................................................Marland KitchenAMY MARTIN RN  August 30, 2007 2:40 PM

## 2011-01-25 NOTE — Assessment & Plan Note (Signed)
Summary: flu shot,tcb  Nurse Visit   Vital Signs:  Patient profile:   60 year old male Temp:     98.2 degrees F  Vitals Entered By: Theresia Lo RN (November 11, 2009 9:22 AM)  Immunizations Administered:  Influenza Vaccine # 1:    Vaccine Type: Fluvax MCR    Site: right deltoid    Mfr: GlaxoSmithKline    Dose: 0.5 ml    Route: [place]    Given by: Theresia Lo RN    Exp. Date: 06/24/2010    Lot #: [id number]    VIS given: 08/04/2009  Flu Vaccine Consent Questions:    Do you have a history of severe allergic reactions to this vaccine? no    Any prior history of allergic reactions to egg and/or gelatin? no    Do you have a sensitivity to the preservative Thimersol? no    Do you have a past history of Guillan-Barre Syndrome? no    Do you currently have an acute febrile illness? no    Have you ever had a severe reaction to latex? no    Vaccine information given and explained to patient? yes  Orders Added: 1)  Influenza Vaccine MCR [00025] 2)  Administration Flu vaccine - MCR [G0008]

## 2011-01-25 NOTE — Letter (Signed)
Summary: Generic Letter  [facility]  [address]   [place], [place] [address]   Phone: [phone]  Fax: [phone]    09/18/2009  [name] [address] [place], [place]  [address]  Dear [name],  Dr. Jeanice Lim received a request for an erectile dysfunction pump for you.  She asks that you make an appointment for an office visit because she will need to discuss this with you before she can approve it. If you have any questions, please call us at [phone]. Thank you!    Sincerely,   Modesta Messing LPN

## 2011-01-25 NOTE — Assessment & Plan Note (Signed)
Summary: FU AND FLU SHOT/KH   Vital Signs:  Patient Profile:   60 Years Old Male Height:     66 inches Weight:      203.1 pounds BP sitting:   137 / 74  (left arm)  Pt. in pain?   yes    Location:   arms and shoulders bilaterally  Vitals Entered By: Theresia Lo RN (October 31, 2007 4:10 PM)              Is Patient Diabetic? No     PCP:  Levander Campion MD  Chief Complaint:  follow up and flu shot.  History of Present Illness: Mr. Maisano here to follow up on the following: 1. lipid panel: Mr. Malinoski is taking vytorin 10/40 for his cholesterol.  His recent LDL and total cholesterol were at goal.  His triglycerides were slightly high at 201. denies side effects from the medication. 2. elevated fasting glucose: Mr. Bannan had a recent fasting glucose of 116.  He denies excessive thirst, excessive urination, fatigue.  Knows he needs to eat better and lose weight, but with his radiculopathy, it is difficult for him to exercise.   3. smoking: Mr. Manning interested in quitting, but not at this time.  He will use nicotine replacement when he is ready.  He has tried chantix, and he did not like it.  Could not sleep with it.  Smoking one ppd.      Risk Factors:  Tobacco use:  current    Physical Exam  General:     Well-developed,well-nourished,in no acute distress; alert,appropriate and cooperative throughout examination Lungs:     Normal respiratory effort, chest expands symmetrically. Lungs are clear to auscultation, no crackles or wheezes. Heart:     Normal rate and regular rhythm. S1 and S2 normal without gallop, murmur, click, rub or other extra sounds. Abdomen:     Bowel sounds positive,abdomen soft and non-tender without masses, organomegaly or hernias noted. Extremities:     No clubbing, cyanosis, edema, or deformity noted with normal full range of motion of all joints.      Impression & Recommendations:  Problem # 1:  PREDIABETES (ICD-790.29) diabetes  education, dietary principles.  Increase exercise as tolerated.  begin metformin 500 two times a day.  Recheck in one month.  on an ACEI. His updated medication list for this problem includes:    Metformin Hcl 500 Mg Tabs (Metformin hcl) .Marland Kitchen... 1 by mouth twice daily  Orders: [facility]- Est  Level 4 (09811)   Problem # 2:  TOBACCO DEPENDENCE (ICD-305.1) encouraged patient to consider beginning to try to quit now, before his scheduled back surgery, as smoking increases healing times.  Patient to consider. support given. Orders: [facility]- Est  Level 4 (99214)   Problem # 3:  HYPERCHOLESTEROLEMIA (ICD-272.0) continue fish oil and vytorin for dyslipidemia.  Counselled to avoid alcohol, sweets, processed sugars for high triglycerides.   Orders: [facility]- Est  Level 4 (91478)  His updated medication list for this problem includes:    Vytorin 10-40 Mg Tabs (Ezetimibe-simvastatin) .Marland Kitchen... 1 by mouth daily   Problem # 4:  HYPERTENSION, BENIGN SYSTEMIC (ICD-401.1) at goal.  continue lotrel. His updated medication list for this problem includes:    Lotrel 10-20 Mg Caps (Amlodipine besy-benazepril hcl) .Marland Kitchen... 1 by mouth daily   Complete Medication List: 1)  Neurontin 400 Mg Caps (Gabapentin) .... Take 1 capsule by mouth three times a day 2)  Neurontin 800 Mg Tabs (Gabapentin) .Marland KitchenMarland KitchenMarland Kitchen 1  by mouth three times daily 3)  Metformin Hcl 500 Mg Tabs (Metformin hcl) .Marland Kitchen.. 1 by mouth twice daily 4)  Lotrel 10-20 Mg Caps (Amlodipine besy-benazepril hcl) .Marland Kitchen.. 1 by mouth daily 5)  Vytorin 10-40 Mg Tabs (Ezetimibe-simvastatin) .Marland Kitchen.. 1 by mouth daily 6)  Prevacid 30 Mg Cpdr (Lansoprazole) .Marland Kitchen.. 1 by mouth daily 7)  Plavix 75 Mg Tabs (Clopidogrel bisulfate) .Marland Kitchen.. 1 by mouth daily 8)  Nabumetone 500 Mg Tabs (Nabumetone) .Marland Kitchen.. 1 by mouth daily 9)  Wellbutrin Xl 150 Mg Tb24 (Bupropion hcl) .Marland Kitchen.. 1 by mouth daily  Other Orders: Flu Vaccine 66yrs + (04540) Admin 1st Vaccine (98119)   Patient Instructions: 1)  Please schedule a  follow-up appointment in 1 month. 2)  Begin taking metformin for prediabetes. 3)  Continue all of your other medications as prescribed.    Prescriptions: METFORMIN HCL 500 MG  TABS (METFORMIN HCL) 1 by mouth twice daily  #60 x 3   Entered and Authorized by:   Levander Campion MD   Signed by:   Levander Campion MD on 11/03/2007   Method used:   Print then Give to Patient   RxID:   [id number]  ]  Influenza Vaccine    Vaccine Type: Fluvax 3+    Site: left deltoid    Mfr: Sanofi Pasteur    Dose: 0.5 ml    Route: IM    Given by:  Theresia Lo RN    Exp. Date: 06/24/2008    Lot #: [id number]    VIS given: 06/24/05 version given October 31, 2007.  Flu Vaccine Consent Questions    Do you have a history of severe allergic reactions to this vaccine? no    Any prior history of allergic reactions to egg and/or gelatin? no    Do you have a sensitivity to the preservative Thimersol? no    Do you have a past history of Guillan-Barre Syndrome? no    Do you currently have an acute febrile illness? no    Have you ever had a severe reaction to latex? no    Vaccine information given and explained to patient? yes

## 2011-01-25 NOTE — Miscellaneous (Signed)
Summary: update problem list  Clinical Lists Changes  Problems: Removed problem of CERUMEN IMPACTION, LEFT (ICD-380.4) Removed problem of CERVICAL RADICULOPATHY (ICD-723.4) Removed problem of ERECTILE DYSFUNCTION (ICD-302.72) Removed problem of BACK PAIN W/RADIATION, UNSPECIFIED (ICD-724.4) Observations: Added new observation of PAST MED HX: Baseline Cr- 0.9 on 8/07,  cervical radiculopathy (spurring at c5-c6),  hiatial hernia HTN DM PAD hyperlipidemia GERD Osteoarthritis Tobacco abuse  erectile dysfunction (10/28/2010 12:04)      Past Medical History:    Baseline Cr- 0.9 on 8/07,     cervical radiculopathy (spurring at c5-c6),     hiatial hernia    HTN    DM    PAD    hyperlipidemia    GERD    Osteoarthritis    Tobacco abuse     erectile dysfunction

## 2011-01-25 NOTE — Consult Note (Signed)
Summary: [facility]  [facility]   Imported By: Denny Peon LEVAN 10/16/2007 10:04:52  _____________________________________________________________________  External Attachment:    Type:   Image     Comment:   External Document

## 2011-01-25 NOTE — Progress Notes (Signed)
Summary: refill request - heartburn meds  Phone Note Call from Patient Call back at Home Phone [phone]   Caller: Patient Summary of Call: Patient checking on status of heartburn meds.  [employer] required a change, they will no longer the brand name and requiring generic.  [facility] [address].  Will send to nurse for review.   Initial call taken by: Rae Roam,  January 30, 2009 11:40 AM  Follow-up for Phone Call        Spoke with pt- advised his omeprazole was sent 01/14/09 with 11 refills.  [facility]  [facility] - they have rx on [facility].  Asked them to fill rx.  Pt notified. Follow-up by: AMY MARTIN RN,  January 30, 2009 12:04 PM

## 2011-01-25 NOTE — Letter (Signed)
Summary: Generic Letter  [name]  [address]   [place], [place] [address]   Phone: [phone]  Fax: [phone]    09/28/2007  [name] [address] [place], [place]  [address]  Dear Mr. Epple,  I missed you at your appointment today.  I hope your neck and arm are feeling better.  I would like to check in with you to discuss the neurosurgeon's plan, and to discuss your recent lab results.  Your metabolic panel showed that your blood sugar was slightly higher than recommended for a person who has been fasting for 8 hours.  Yours was 115; we would like to see it below 100.  It is not considered diabetes until it is greater than 126; however, I am concerned if you do not increase your exercise, limit sweets in your diet you may develop diabetes over time.  Additionally, your cholesterol levels were excellent, but your triglyceride levels were slightly high at 201 (goal is less than 150).  I would recommend decreasing sweets and fried foods, and that you begin taking a 1000mg  capsule of fish oil twice daily to improve this value.  Please call us and make an appointment to further discuss this.  Let's plan on rechecking your blood sugar and triglycerides in 3 months.  I look forward to seeing you.         Sincerely,   Levander Campion MD [name]  Appended Document: Generic Letter patient letter mailed

## 2011-03-15 LAB — CBC
HCT: 35.6 % — ABNORMAL LOW (ref 39.0–52.0)
Hemoglobin: 11.5 g/dL — ABNORMAL LOW (ref 13.0–17.0)
MCHC: 32.3 g/dL (ref 30.0–36.0)
MCV: 75.2 fL — ABNORMAL LOW (ref 78.0–100.0)
Platelets: 297 10*3/uL (ref 150–400)
RBC: 4.73 MIL/uL (ref 4.22–5.81)
RDW: 18.2 % — ABNORMAL HIGH (ref 11.5–15.5)
WBC: 7.7 10*3/uL (ref 4.0–10.5)

## 2011-03-15 LAB — BASIC METABOLIC PANEL
BUN: 15 mg/dL (ref 6–23)
CO2: 30 mEq/L (ref 19–32)
Calcium: 8.8 mg/dL (ref 8.4–10.5)
Chloride: 102 mEq/L (ref 96–112)
Creatinine, Ser: 1 mg/dL (ref 0.4–1.5)
GFR calc Af Amer: >60 mL/min (ref >60)
GFR calc non Af Amer: >60 mL/min (ref >60)
Glucose, Bld: 123 mg/dL — ABNORMAL HIGH (ref 70–99)
Potassium: 3.6 mEq/L (ref 3.5–5.1)
Sodium: 136 mEq/L (ref 135–145)

## 2011-03-15 LAB — GLUCOSE, CAPILLARY
Glucose-Capillary: 114 mg/dL — ABNORMAL HIGH (ref 70–99)
Glucose-Capillary: 115 mg/dL — ABNORMAL HIGH (ref 70–99)
Glucose-Capillary: 79 mg/dL (ref 70–99)
Glucose-Capillary: 96 mg/dL (ref 70–99)
Glucose-Capillary: 99 mg/dL (ref 70–99)

## 2011-03-29 ENCOUNTER — Other Ambulatory Visit: Payer: Self-pay | Admitting: Family Medicine

## 2011-03-29 NOTE — Telephone Encounter (Signed)
Refill request

## 2011-04-03 LAB — CBC
HCT: 38.3 % — ABNORMAL LOW (ref 39.0–52.0)
Hemoglobin: 12.6 g/dL — ABNORMAL LOW (ref 13.0–17.0)
MCHC: 32.8 g/dL (ref 30.0–36.0)
MCV: 73.5 fL — ABNORMAL LOW (ref 78.0–100.0)
Platelets: 376 10*3/uL (ref 150–400)
RBC: 5.2 MIL/uL (ref 4.22–5.81)
RDW: 18.1 % — ABNORMAL HIGH (ref 11.5–15.5)
WBC: 8.8 10*3/uL (ref 4.0–10.5)

## 2011-04-03 LAB — GLUCOSE, CAPILLARY
Glucose-Capillary: 113 mg/dL — ABNORMAL HIGH (ref 70–99)
Glucose-Capillary: 114 mg/dL — ABNORMAL HIGH (ref 70–99)
Glucose-Capillary: 115 mg/dL — ABNORMAL HIGH (ref 70–99)
Glucose-Capillary: 124 mg/dL — ABNORMAL HIGH (ref 70–99)
Glucose-Capillary: 162 mg/dL — ABNORMAL HIGH (ref 70–99)

## 2011-04-03 LAB — BASIC METABOLIC PANEL
BUN: 9 mg/dL (ref 6–23)
CO2: 26 mEq/L (ref 19–32)
Calcium: 9.3 mg/dL (ref 8.4–10.5)
Chloride: 106 mEq/L (ref 96–112)
Creatinine, Ser: 0.91 mg/dL (ref 0.4–1.5)
GFR calc Af Amer: >60 mL/min (ref >60)
GFR calc non Af Amer: >60 mL/min (ref >60)
Glucose, Bld: 110 mg/dL — ABNORMAL HIGH (ref 70–99)
Potassium: 4.1 mEq/L (ref 3.5–5.1)
Sodium: 139 mEq/L (ref 135–145)

## 2011-04-26 ENCOUNTER — Other Ambulatory Visit: Payer: Self-pay | Admitting: Family Medicine

## 2011-04-26 NOTE — Telephone Encounter (Signed)
Refill request

## 2011-04-27 ENCOUNTER — Other Ambulatory Visit: Payer: Self-pay | Admitting: Family Medicine

## 2011-04-27 NOTE — Telephone Encounter (Signed)
Refill request

## 2011-05-09 ENCOUNTER — Ambulatory Visit (INDEPENDENT_AMBULATORY_CARE_PROVIDER_SITE_OTHER): Payer: Medicaid Other | Admitting: Family Medicine

## 2011-05-09 ENCOUNTER — Ambulatory Visit
Admission: RE | Admit: 2011-05-09 | Discharge: 2011-05-09 | Disposition: A | Discharge status: Home / Self Care | Payer: Medicaid Other | Source: Ambulatory Visit | Attending: Family Medicine | Admitting: Family Medicine

## 2011-05-09 ENCOUNTER — Encounter: Payer: Self-pay | Admitting: Family Medicine

## 2011-05-09 ENCOUNTER — Telehealth: Payer: Self-pay | Admitting: Family Medicine

## 2011-05-09 VITALS — BP 150/85 | HR 69 | Temp 98.0°F | Ht 66.0 in | Wt 202.0 lb

## 2011-05-09 DIAGNOSIS — S92911A Unspecified fracture of right toe(s), initial encounter for closed fracture: Secondary | ICD-10-CM | POA: Insufficient documentation

## 2011-05-09 DIAGNOSIS — S99929A Unspecified injury of unspecified foot, initial encounter: Secondary | ICD-10-CM

## 2011-05-09 DIAGNOSIS — E119 Type 2 diabetes mellitus without complications: Secondary | ICD-10-CM

## 2011-05-09 DIAGNOSIS — E7800 Pure hypercholesterolemia, unspecified: Secondary | ICD-10-CM

## 2011-05-09 DIAGNOSIS — S8990XA Unspecified injury of unspecified lower leg, initial encounter: Secondary | ICD-10-CM

## 2011-05-09 DIAGNOSIS — I10 Essential (primary) hypertension: Secondary | ICD-10-CM

## 2011-05-09 HISTORY — DX: Unspecified fracture of right toe(s), initial encounter for closed fracture: S92.911A

## 2011-05-09 LAB — COMPREHENSIVE METABOLIC PANEL WITH GFR
ALT: 9 U/L (ref 0–53)
AST: 14 U/L (ref 0–37)
Albumin: 4.2 g/dL (ref 3.5–5.2)
Alkaline Phosphatase: 62 U/L (ref 39–117)
BUN: 9 mg/dL (ref 6–23)
CO2: 21 meq/L (ref 19–32)
Calcium: 9.3 mg/dL (ref 8.4–10.5)
Chloride: 107 meq/L (ref 96–112)
Creat: 0.89 mg/dL (ref 0.40–1.50)
Glucose, Bld: 129 mg/dL — ABNORMAL HIGH (ref 70–99)
Potassium: 4 meq/L (ref 3.5–5.3)
Sodium: 138 meq/L (ref 135–145)
Total Bilirubin: 0.2 mg/dL — ABNORMAL LOW (ref 0.3–1.2)
Total Protein: 7 g/dL (ref 6.0–8.3)

## 2011-05-09 LAB — POCT GLYCOSYLATED HEMOGLOBIN (HGB A1C): Hemoglobin A1C: 6.4

## 2011-05-09 NOTE — Assessment & Plan Note (Signed)
Prolonged swelling and bruising, pt had PAD, will send for X-ray to rule out fracture of toe

## 2011-05-09 NOTE — Assessment & Plan Note (Signed)
Continue current meds, A1C 6.4% today Doubt the few episodes of loose stools were secondary to his medications.

## 2011-05-09 NOTE — Progress Notes (Signed)
  Subjective:    Patient ID: [name], male    DOB: 1951-08-21, 61 y.o.   MRN: [id number]  HPI Stumped Right great toe- 5/5, had swelling, pain, abrasion to shin, used ice soaks and warm water soaks , took Tyelnol for pain , no bleeding, still has considerate amount of swelling in toe, painful to ambulate   Diabetes- lowest 78 (little headed )ate a cookie felt better afterwards, highest 178  Fasting CBG  Take CBG twice a day, before dinner - typically 140's , tolerating Metformin and Glipizide, this past week had a few episodes of loose stools, NB, only occurred once in AM, no abd pain, thought it was secondary to meds  Rescheduled with Dr. Nadara Eaton- needs cardiology appt  HTN- takes BP at home, but did not bring log, no change to recent meds   Review of Systems per above     Objective:   Physical Exam   GEN- NAD, alert and oriented   HEENT- Fundoscopic exam benign, MMM   Neck- soft carotid bruit on Right side   CVS- RRR, no murmur   RESP- CTAB ABd- Soft, NT   Ext- Right Great Toe- swelling of great toe, bruising noted, mild pain with palpation along DIP and lateral aspects of toe, no subungual hematoma seen, other toes normal ROM   Neuro- Monofilament- decreased sensation over bilat heels to mid-foot, decreased sensation at points below 1st and 5th digits bilat, normal propioception, callus on heels and and metatarsal fat pad bilat    Skin- 4cm abrasion to right shin, healing well, no erythema, non tender   Pulses- feet warm to touch, unable to palpate DP         Assessment & Plan:

## 2011-05-09 NOTE — Patient Instructions (Signed)
Next Visit  In 4 months Please get the x-ray of your foot  I will send a letter with lab results  Continue your current medications Schedule with Dr. Nadara Eaton Schedule with Dr. Loreta Ave for your colonoscopy

## 2011-05-09 NOTE — Assessment & Plan Note (Signed)
BP above goal, pt has had history of difficult to control BP. No change to medications He will see cardiology within the next month Consider increasing his ACE

## 2011-05-09 NOTE — Telephone Encounter (Signed)
Discussed x ray results, pt to be put in post op shoe F/U in 1 week to re-evaluate if little change or worsening refer to Ortho Pt voices understanding, will come pick up scrip for post-op shoe

## 2011-05-10 ENCOUNTER — Encounter: Payer: Self-pay | Admitting: Family Medicine

## 2011-05-10 LAB — LDL CHOLESTEROL, DIRECT: Direct LDL: 36 mg/dL

## 2011-05-10 NOTE — Procedures (Signed)
[name], [name]                ACCOUNT NO.:  [id number]   MEDICAL RECORD NO.:  [id number]          PATIENT TYPE:  INP   LOCATION:  2501                         FACILITY:  [facility]   PHYSICIAN:  Nanetta Batty, M.D.   DATE OF BIRTH:  1951-11-15   DATE OF PROCEDURE:  04/29/2010  DATE OF DISCHARGE:                    PERIPHERAL VASCULAR INVASIVE PROCEDURE   PROCEDURE:  Percutaneous transluminal angioplasty with stent.   CARDIOLOGIST:  Dr. Nanetta Batty.   INDICATIONS FOR PROCEDURE:  Mr. Rubert is a 60 year old African American  male with a history of PV OD, status post bilateral PTA and stenting by  Dr. Jacinto Halim in the past.  His risk factors include non-insulin-requiring  diabetes, hypertension, hyperlipidemia and continued tobacco abuse of  several cigars a day.  He has had a decrease in his left ABI from 0.9 to  the 0.7 range with inter-current occlusion of his left SFA with left  calf claudication over the last several months.  He presents now for an  attempted percutaneous revascularization.   DESCRIPTION OF PROCEDURE:  The patient is brought to the second floor  [facility] Angiographic Suite in the post-absorptive  state.  He is premedicated with p.o. Valium, IV Versed and fentanyl.  His right groin was prepped and shaved in the usual sterile fashion.  Xylocaine 1% was used for local anesthesia.  A 5-French sheath was  inserted into the right femoral artery using the standard Seldinger  technique.  A 5-French pigtail catheter was used for an abdominal  aortography with bifemoral runoff using a bolus Chase digital  subtraction stepping table technique.  Visipaque dye was used for the  entirety of the case.  Retrograde aortic pressure was monitored  throughout the case.   ANGIOGRAPHIC RESULTS:  1. Abdominal aorta:      a.     Renal arteries:  Normal.          I. __________abdominal aorta:  Mild atherosclerotic changes.  2. Left lower extremity:      a.      Total left SFA:  Above the previously-placed stent with       filling by collaterals within the distal half of the stent.  A two-       vessel runoff of the anterior tibial.  3. Right lower extremity:      a.     Patent right SFA stent with some mild atherosclerotic       changes beyond this.   DESCRIPTION OF PROCEDURE:  Contralateral access was obtained with a 500  crossover catheter, a glide wire, end-hole, 6-French crossover sheath  and Rosen wire.  There were 3000 units of heparin administered  intravenously.  The [facility] wire was then advanced to the point of  occlusion and a 3.5 Qiuck-Cross was then advanced over the [facility] wire  and exchanged for a 3.5 diagonal glide wire.  This was advanced to the  level of the proximal edge of the stent but was always subintimal.  I  was never able to re-enter the true lumen despite multiple attempts in  multiple orthogonal views.  Ultimately the procedure was  aborted and the  sheath was pulled back across the left bifurcation.  Next, the  __________ and the sheath were removed.  Pressure was held on the graft  to achieve hemostasis.   The patient left the laboratory in stable condition.  He will be  hydrated overnight and discharged home in the morning.  I will see him  back in the office in approximately one to two weeks for followup.  He  will probably require an elective femoral-popliteal bypass grafting.      Nanetta Batty, M.D.     Cordelia Pen  D:  04/29/2010  T:  04/29/2010  Job:  [id number]   cc:   Ritta Slot, M.D.  [facility]   Electronically Signed by Nanetta Batty M.D. on 05/11/2010 03:26:06 PM

## 2011-05-10 NOTE — Discharge Summary (Signed)
NAMERAYMUNDO, ROUT                ACCOUNT NO.:  [id number]   MEDICAL RECORD NO.:  [id number]          PATIENT TYPE:  INP   LOCATION:  2503                         FACILITY:  [facility]   PHYSICIAN:  Cristy Hilts. Jacinto Halim, MD       DATE OF BIRTH:  November 25, 1951   DATE OF ADMISSION:  07/13/2009  DATE OF DISCHARGE:  07/14/2009                               DISCHARGE SUMMARY   DISCHARGE DIAGNOSES:  1. Increasing claudication.  2. Peripheral arterial disease.      a.     Stenting of the right superficial femoral artery secondary       to stenosis.      b.     History of percutaneous transluminal angioplasty and       stenting of bilateral superficial femoral arteries.  3. Diabetes mellitus.  4. Continued tobacco.  5. Hyperlipidemia.   DISCHARGE CONDITION:  Stable.   PROCEDURES:  Percutaneous transluminal angioplasty and stent deployment  of the right superficial femoral artery by Dr. Jacinto Halim.   DISCHARGE MEDICATIONS:  1. Lotrel 10/20 one daily.  2. Aspirin 81 mg two daily.  3. Vytorin 10/40 daily.  4. Plavix 75 mg daily.  5. Nabumetone 500 mg daily.  6. Omeprazole 40 mg daily.  7. Neurontin 400 mg 3 times a day.  8. Metformin 500 mg twice a day, but do not take until July 16, 2009.  9. Viagra 50 mg as needed as before.  10.Glipizide 5 mg daily.   DISCHARGE INSTRUCTIONS:  1. Low-sodium heart-healthy diabetic diet.  2. Wash cath site with soap and water.  Call if any bleeding,      swelling, or drainage.  3. Increase activity slowly.  May shower, but no lifting for 1 week.      No driving for 2 days.  4. Followup with Dr. Lynnea Ferrier.  Office will call with date and time.  5. You will be scheduled for followup of lower extremity arterial      Dopplers.  Office will call with date and time.   HOSPITAL COURSE:  The patient is 60 year old gentleman with known  peripheral arterial disease.  He had increasing claudication symptoms  where he is unable to do much physical activity.  He complains of  pain  in his left hip and left thigh with walking, but his right leg has  pretty much given up with minimal activity, walking less than half a  block.  He has developed some marked sedentary lifestyle since  claudication has worsened.  He has also been diagnosed with diabetes.   ALLERGIES:  No known allergies.   The patient was brought in for arteriogram, which he underwent,  previously placed stents in 2006 and 2008 were patent.  He did have  stenosis of the right superficial femoral artery, underwent PTA and  stent deployment by Dr. Jacinto Halim.   The patient tolerated the procedure well and was to go home, but  developed some groin bleeding issues and short stay that was in the late  afternoon, we would go ahead and keep him overnight, which we did.  By the next morning on July 14, 2009, the patient had no problems,  ambulated without problems, and tobacco cessation was given.  They  recommended Nicoderm patch and he was instructed to use if he wishes to  do so, but we did recommend stopping tobacco.  He will be discharged  home.      [name]. [place], N.P.      Cristy Hilts. Jacinto Halim, MD     LRI/MEDQ  D:  07/14/2009  T:  07/15/2009  Job:  [id number]   cc:   Levander Campion, M.D.

## 2011-05-10 NOTE — Op Note (Signed)
[name], [name]                ACCOUNT NO.:  [id number]   MEDICAL RECORD NO.:  [id number]          PATIENT TYPE:  INP   LOCATION:  3599                         FACILITY:  [facility]   PHYSICIAN:  Danae Orleans. Venetia Maxon, M.D.  DATE OF BIRTH:  [birth year]   DATE OF PROCEDURE:  10/03/2008  DATE OF DISCHARGE:                               OPERATIVE REPORT   PREOPERATIVE DIAGNOSES:  Herniated cervical disk with cervical  spondylosis, degenerative disease, and radiculopathy C5-6 and C6-7  levels.   POSTOPERATIVE DIAGNOSES:  Herniated cervical disk with cervical  spondylosis, degenerative disease, and radiculopathy C5-6 and C6-7  levels.   PROCEDURES:  Anterior cervical decompression and fusion C5-6 and C6-7  with allograft, autograft, EquivaBone, and anterior cervical plate C5  through C7 levels.   SURGEON:  Danae Orleans. Venetia Maxon, MD   ASSISTANT:  Clydene Fake, MD   ANESTHESIA:  General endotracheal anesthesia.   ESTIMATED BLOOD LOSS:  Minimal.   COMPLICATIONS:  None.   DISPOSITION:  Recovery.   INDICATIONS:  [name] is a 60 year old male with significant right  C7 and C6 radiculopathies with significant neck and arm pain.  It was  elected to take him to surgery for anterior cervical decompression and  fusion at the C5-6 and C6-7 levels.   PROCEDURE:  Mr. Buda was brought to the operating room.  Following a  satisfactory and uncomplicated induction of general endotracheal  anesthesia and placement of intravenous lines, the patient was placed in  a supine position on the operating table.  His neck was placed in slight  extension.  Head was placed on a horseshoe head holder, was placed in 10  pounds of halter traction.  His anterior neck was then prepped and  draped in the usual sterile fashion.  The area of planned incision was  infiltrated with local lidocaine.  Incision was made in midline and  carried to the anterior border of sternocleidomastoid muscle.  Blunt  dissection was  performed keeping the carotid sheath lateral and trachea  and esophagus medial exposing the anterior cervical spine.  A bent  spinal needle was placed at C4-5 and C5-6 levels.  An intraoperative x-  ray confirmed these to be the correct levels.  Subsequently, further  exposure was performed at C5-6 and C6-7 levels and longus colli muscles  were taken down from the anterior cervical spine at each of these levels  and the interspaces were incised.  Osteophytes were removed, and self-  retaining retractors were placed to facilitate exposure including up and  down retractors.  Interspaces at C5-6 and C6-7 were highly spondylitic,  they were incised, and disk material was removed in piecemeal fashion.  Distraction pins were placed at C5 and C6 and initially that interspaces  were evacuated of residual disk material.  Large uncinate spurs were  drilled down, and using a 2-mm gold tip Kerrison rongeur, the spinal  cord dura was decompressed as were both C6 nerve roots, particularly on  the right where there was significant spondylitic foraminal stenosis.  Hemostasis was assured and this site was packed off and exposure was  carried to the C6-7 level where similar decompression was removed.  The  bone drilled at the endplates, were safe for later use with bone  grafting.  A large uncinate spur was then removed at the C6-7 level on  the right and the spinal cord dura.  Both C7 nerve roots were widely  decompressed.  Hemostasis was assured at this level, and after trial  sizing, a 7-mm allograft bone wedge was selected, fashioned with a high-  speed drill, packed with morselized bone autograft, and EquivaBone  inserted in the interspace and countersunk appropriately.  Attention was  then turned to the C5-6 level where a 6-mm graft was selected, fashioned  with a high-speed drill, and packed with morselized autograft, and  EquivaBone inserted at the interspace and countersunk appropriately.  A  30-mm  TRESTLE anterior cervical plate was affixed to the anterior  cervical spine with 40-mm variable angle screws, 2 at C5, 2 at C6, and 2  at C7.  Traction was removed prior to placing a plate.  Hemostasis was  assured.  Final x-ray showed well-positioned interbody graft and  anterior plate at the C5.  The rest of the construct was not well  visualized because of the patient's large body habitus.  Hemostasis was  assured.  Platysma layer was closed with 3-0 Vicryl sutures and skin  edges were approximated with 3-0 Vicryl subcuticular stitch.  The wound  was dressed with Dermabond.  The patient was extubated in the operating  room, taken to the recovery in stable satisfactory condition, having  tolerated his operation well.  Counts were correct at the end of the  case.      Danae Orleans. Venetia Maxon, M.D.  Electronically Signed     JDS/MEDQ  D:  10/03/2008  T:  10/03/2008  Job:  [id number]

## 2011-05-10 NOTE — Cardiovascular Report (Signed)
[name]                ACCOUNT NO.:  [id number]   MEDICAL RECORD NO.:  [id number]          PATIENT TYPE:  INP   LOCATION:  2503                         FACILITY:  [facility]   PHYSICIAN:  Cristy Hilts. Jacinto Halim, MD       DATE OF BIRTH:  10/07/1951   DATE OF PROCEDURE:  07/13/2009  DATE OF DISCHARGE:                            CARDIAC CATHETERIZATION   PROCEDURES PERFORMED:  1. Abdominal aortogram.  2. Pelvic aortogram.  3. Abdominal aortogram with bifemoral runoff.  4. Crossover from the left femoral artery into the right femoral      artery.  5. Right femoral arteriogram.  6. Stenting of the right superficial femoral artery.   INDICATIONS:  [name] is a 60 year old gentleman with known  peripheral arterial disease.  He had undergone stenting of left SFA on  October 2006 and right mid SFA in March 2008 with 6.6-mm Xceed stents.  He had presented now with increasing symptoms of claudication of his  right lower extremity and the lower extremity arterial Dopplers have  suggested a high-grade velocities proximal to the previously placed  right superficial femoral artery stent.  He was now brought to the  catheterization lab to evaluate his peripheral anatomy.   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  There were widely patent.  There were some  mild atherosclerotic changes of the abdominal aorta.  Aortoiliac  bifurcation was widely patent.   Left iliac artery showed a smooth 40 to at most 50% stenosis without any  pressure gradient across the external iliac artery stenosis.   Left femoral artery was widely patent and the left superficial femoral  artery showed calcific spicules with 30-40% stenosis.  The previously  placed stent in the left mid SFA is widely patent.   Below the left knee, there was two-vessel runoff in the form of peroneal  and posterior tibial artery.   Right iliac artery was widely patent.   Right femoral artery was also  widely patent.  The right superficial femoral artery was occluded  proximal to the previously placed stent.  There was again calcific  spicules noted proximal to occlusion.   Below the right knee, there was one-vessel runoff in the form of  peroneal artery.   INTERVENTION DATA:  Successful PTA and stenting of chronically occluded  right superficial femoral artery with implantation of a 6.0 x 120-mm  Xceed self-expanding stent.  Overall stenosis was reduced from 100% to  0% with brisk flow noted at the end of the procedure.   RECOMMENDATIONS:  The patient will need continued risk modification.  Smoking cessation was again stressed to the patient.  He would be  discharged home today.  A total of 159 mL of contrast was utilized for  diagnostic and interventional procedure.   TECHNIQUE AND PROCEDURE:  Under usual sterile precautions using a 5-  French left femoral arterial access, a 5-French Omniflush catheter was  advanced into the abdominal aorta and abdominal aortogram was performed.  The same catheter was utilized to engage the right iliac artery  and  using Versacore wire, I was able to cross into the right superficial  femoral artery.   The 5-French sheath was exchanged to a 7-French Terumo sheath.  The  sheath was carefully positioned in the right proximal superficial  femoral artery.  Right femoral arteriogram was performed.  Then, using a  5-French glide-tip end-hole catheter and a guidewire, I was able to  cross the chronic total occlusion without any significant difficulty.  Then, I placed end-hole catheter distal to the occlusion and exchanged  that to long Versacore wire.  The intervention was performed with a 0.03  fifth of inch wire.  Initially, predilatation was performed with a 4.0 x  100-mm FoxCross balloon and followed by 6.0 x 120-mm Xceed stent  followed by postdilatation with a 5.0 x 170-mm FoxCross balloon with  excellent results.   Postprocedure angiography revealed excellent result.  The wire was withdrawn back.  The sheath was gently pulled back into the  left iliac artery and careful pullback was performed to evaluate the  pressure gradient across the left external iliac artery stenosis.  No  pressure gradient was evident.  Then, the sheath was exchanged to a  short sheath and sutured in place.  During procedure, intravenous  heparin was administered and ACT maintained greater than 200.  The  patient tolerated the procedure well.  No immediate complications.      Cristy Hilts. Jacinto Halim, MD     JRG/MEDQ  D:  07/13/2009  T:  07/14/2009  Job:  [id number]

## 2011-05-13 ENCOUNTER — Encounter: Payer: Self-pay | Admitting: Family Medicine

## 2011-05-13 ENCOUNTER — Telehealth: Payer: Self-pay | Admitting: Family Medicine

## 2011-05-13 ENCOUNTER — Ambulatory Visit (INDEPENDENT_AMBULATORY_CARE_PROVIDER_SITE_OTHER): Payer: Medicaid Other | Admitting: Family Medicine

## 2011-05-13 VITALS — BP 125/76 | HR 75 | Temp 97.8°F | Wt 204.0 lb

## 2011-05-13 DIAGNOSIS — S99929A Unspecified injury of unspecified foot, initial encounter: Secondary | ICD-10-CM

## 2011-05-13 DIAGNOSIS — S8990XA Unspecified injury of unspecified lower leg, initial encounter: Secondary | ICD-10-CM

## 2011-05-13 MED ORDER — OXYCODONE-ACETAMINOPHEN 5-325 MG PO TABS: 1.0000 | ORAL_TABLET | Freq: Four times a day (QID) | ORAL | Status: DC | PRN

## 2011-05-13 NOTE — Patient Instructions (Signed)
It was a pleasure to see you today.  You have a nondisplaced fracture in the right great toe.  The most important thing is to use a shoe that will be immobile.  The post-op shoe can be bought at the [employer] [facility] on [address] (cost around $45).    If you cannot get the post-op shoe, then footwear that protects your toe (such as a steel-toed boot) would work.   I printed a prescription for percocet 5/325; do not take when you need to be alert.  You may take ibuprofen 600mg  every 6 hours with something to eat, for pain for the next few days.   MAKE APPT WITH SPORTS MEDICINE, DR NEAL ON Monday AFTERNOON (I spoke with Dr Jennette Kettle and she authorizes double- or triple-booking as needed).

## 2011-05-13 NOTE — Progress Notes (Signed)
  Subjective:    Patient ID: [name], male    DOB: [birth year], 60 y.o.   MRN: [id number]  HPI Here for follow up of R great toe pain; stubbed on concrete step while wearing sandals 2 weeks ago, seen here 4 days ago and had x-rays, which show a distal phalangeal fracture of the R great toe (nondisplaced).  He has been walking in shoes with poor support since then, tries to walk on lateral edge of R foot. Unable to drive due to pain.  Was unable to afford the Post-Op shoe that was recommended by Dr Jeanice Lim.  Denies fevers or chills.    Review of Systems     Objective:   Physical Exam Alert, generally well appearing, No apparent distress.  EXTS:  R foot with palpable dorsalis pedis pulse and brisk cap refill in all digits.  R great toe with mild edema and tender to light touch at base of toe.  No open area on foot, no maceration between toes.        Assessment & Plan:

## 2011-05-13 NOTE — Cardiovascular Report (Signed)
NAMEWILMER, [name]                ACCOUNT NO.:  [id number]   MEDICAL RECORD NO.:  [id number]          PATIENT TYPE:  INP   LOCATION:  6524                         FACILITY:  [facility]   PHYSICIAN:  Cristy Hilts. Jacinto Halim, MD       DATE OF BIRTH:  [birth year]   DATE OF PROCEDURE:  03/07/2007  DATE OF DISCHARGE:  03/08/2007                            CARDIAC CATHETERIZATION   PROCEDURE PERFORMED:  1. Right iliac arteriography.  2. Left iliac arteriography.  3. Right femoral arteriography with distal runoff.  4. PTA of the right SFA and stenting of the right SFA.   INDICATIONS:  [name] is a 55-year, black male with a history of  hypertension and known peripheral arterial disease.  He has undergone  balloon angioplasty to his right SFA x2 in the past and the latest one  was on December 27, 2005 for a high-grade right SFA stenosis.  However,  his claudication symptoms have worsened and also his ABI has decreased  on his right and he started to use crutches and also a walker to walk  because of his severe claudication.  Given abnormal ABI, he was brought  back to the catheterization lab to reevaluate his peripheral anatomy.  Given the fact that he has had 2 restenoses with balloon angioplasty, we  tentatively planned to stent his right SFA.   ANGIOGRAPHIC DATA:  Right iliac artery:  The right iliac artery is  widely patent with mild luminal irregularity.  In the proximal segment  of the right common iliac artery, there was a 20-mm pressure gradient;  however, there was no lesion visualized.  I suspect there is an  eccentric lesion in the right common iliac artery in the mid segment.  The right external iliac artery is widely patent.   Right femoral arteriogram with distal femoral runoff revealed diffuse  calcification of the right SFA.  There was a high-grade 80-90% stenosis  in the mid segment of the right SFA.  Distally below the knee the  anterior and posterior tibials are occluded and there is  one-vessel  runoff in the form of peroneal artery.   Left iliac artery:  The left iliac artery is widely patent; however,  there was a 40% heavy plaque burden noted, but there was no pressure  gradient with the 7-French catheter that was placed in the left common  iliac and external iliac artery.   INTERVENTION DATA:  Successful PTA and stenting of the right SFA with a  6.0 x 60 mm Exceed self-expanding stent.  This stent was postdilated  with a 5.0 x 60 mm Sterling balloon at 10 atmospheres of pressure.  Overall the stenosis was reduced from 80% to 0% with brisk flow.  Post  angioplasty, angiography revealed excellent results.   RECOMMENDATIONS:  The patient will be continued on aggressive risk  modification.  Hopefully this stenting will have helped his restenosis  and reduced the restenosis.   A total of 90-95 mL of contrast was utilized for diagnostic angiography.   PROCEDURE:  Under usual sterile precautions using a 7-French left  femoral arterial  access, a 7-French crossover catheter was utilized to  cross from the left iliac artery to the right iliac artery with the help  of a 5-French crossover catheter.  A 0.035 inch Glidewire was utilized  to cross over.  Then positioning the catheter into the common femoral  artery, femoral arteriogram with distal runoff was performed.   TECHNICAL INTERVENTION:  Using heparin for anticoagulation and  maintaining ACT greater than 250, a 0.014 inch x __________ cm [place]  wire was advanced through the SFA and positioned distally.  Then a 3.5 x  20 mm Angiosculpt arteriotomy balloon was utilized and multiple  inflations at the SFA was performed at 6 atmospheres of pressure x3 for  60 seconds.  Then we deployed a 6.0 x 60 mm Exceed self-expanding stent  and post dilated this with a 5.0 x 60 mm Sterling balloon at 10  atmospheres of pressure for 75 seconds.  Post balloon angioplasty  excellent results were noted.   Overall excellent  results were noted.  Then the crossover catheter was  gently pulled back into the left common femoral artery while measuring  the pressure gradient across the right common iliac and also the left  common and left external iliac artery.  The patient tolerated the  procedure.  No immediate complication noted.      Cristy Hilts. Jacinto Halim, MD  Electronically Signed     JRG/MEDQ  D:  03/07/2007  T:  03/09/2007  Job:  [id number]   cc:   Broadus John T. Pamalee Leyden, MD

## 2011-05-13 NOTE — Assessment & Plan Note (Signed)
Patient with radiographic evidence of distal phalanynx fracture of R great toe.  Has been unable to afford post-op shoe that was recommended by Dr Jeanice Lim earlier this week.  Does not have footwear that provides protection and support to the fractured toe.  I called [employer], they state that post-op shoe is around $46 and will not be covered by Medicaid or most commercial insurers.  Recommendation to get at [facility] on [address], perhaps less expensive there.  I spoke with Dr Jennette Kettle in Sports Medicine, patient may be seen in [facility] on Monday (May 21st) afternoon if continues with pain.  Analgesia with percocet, NSAID until then. Discussion of side effects of the meds, he states that R foot pain precludes driving at this point.

## 2011-05-13 NOTE — Telephone Encounter (Signed)
Pt recently seen for fractured toe, says the swelling is not going down, asking for abx & pain med

## 2011-05-13 NOTE — Op Note (Signed)
NAMEDANTA, BAUMGARDNER                ACCOUNT NO.:  [id number]   MEDICAL RECORD NO.:  [id number]          PATIENT TYPE:  OBV   LOCATION:  4729                         FACILITY:  [facility]   PHYSICIAN:  Cristy Hilts. Jacinto Halim, MD       DATE OF BIRTH:  09-Jan-1951   DATE OF PROCEDURE:  10/04/2005  DATE OF DISCHARGE:                                 OPERATIVE REPORT   PROCEDURE PERFORMED:  1.  Abdominal aortogram with bi-femoral run off.  2.  Right femoral angiography.  3.  Left iliac arteriogram.  4.  Left femoral arteriography.  5.  Percutaneous transluminal angioplasty and stenting of the left      superficial femoral artery and left popliteal artery.  6.  Right femoral angiography and closure of right femoral artery access      with Starclose.   INDICATIONS FOR PROCEDURE:  Mr. Trenton Passow is a 60 year old gentleman with  a history of hypertension, hyperlipidemia, and severe peripheral vascular  disease.  He has been having lifestyle limiting claudication.  He had  undergone lower extremity arterial Dopplers which revealed an occluded left  SFA and moderate disease of the right.  Given his significant symptoms of  claudication, he was brought to the peripheral angiography suite for  definitive diagnosis and for possible PTA.   ANGIOGRAPHIC DATA:  Abdominal aortogram:  Abdominal aortogram revealed the presence of two renal  arteries, one on either side, each was widely patent.  The aortoiliac  bifurcation was widely patent.   Left iliac artery with femoral run off revealed widely patent left common  iliac artery.  However, there was heavy plaque burden of the left external  iliac artery, each constituted 60-70% stenosis.  There was a 30 mm pressure  gradient across this stenosis.  The left external iliac continued as a  common femoral artery and then into the superficial femoral artery which is  completely occluded in its mid to distal segment.  This has got ipsilateral  collaterals from the  profunda femoral artery.  There was one vessel run off  in the form of peroneal artery which was very large and this gave  collaterals to the occluded posterior and anterior tibial arteries.  The  common tibial peroneal trunk had a 50-60% stenosis.   Right iliac artery with femoral run off revealed mild to moderate diffuse  disease of the external iliac and superficial femoral artery.  There was a  cheesy like material stenosis which was 20-30% of the right SFA.  This was  also seen in the left SFA.  The popliteal artery was patent and the peroneal  artery was patent, however, the anterior and posterior tibials were occluded  just as in the left leg.  There was distal reconstitution from collaterals  from the peroneal artery of the anterior and posterior tibial arteries.   INTERVENTIONAL DATA:  Successful PTA and stenting of the occluded left SFA  with a 7 mm by 150 mm self-expanding stent.  The stenosis was reduced from  100% to 0%.  Post stent deployment, the popliteal artery showed an 80%  stenosis and this was dilated and then because of dissection, was also  stented with a 6 mm by 60 mm self-expanding Prodigy stent.  Post stent  deployment excellent brisk flow was noted throughout the lower extremity.   RECOMMENDATIONS:  The patient will be continued on aggressive risk  modification.  Given the fact  that he has got a 60-70% left external iliac  artery stenosis with a 30 mm pressure gradient across the left external  iliac artery, he will need PTA of the left external iliac artery.  This  procedure was not performed at this setting as I used 220 mL of contrast  and, also, the patient was not necessarily very cooperative.  Because of his  severe back pain, he was squirmy all over.  Hence, this will be scheduled on  an elective basis.  The stenting of the left SFA was performed because,  again, of dissection noted in the left SFA.  Overall, two stents, 7 by 150  mm self-expanding  Prodigy and an overlapping 6 by 60 mm Prodigy stent was  deployed in the left distal SFA and the popliteal artery with excellent  results.   TECHNIQUES OF PROCEDURE:  In the usual sterile technique, using a 5 French  right femoral artery access,  a 5 French pigtail catheter was advanced into  the abdominal aorta and abdominal aortogram was performed.  Then, the same  catheter was pulled back into the distal abdominal aorta and abdominal  aortogram with bi-femoral run off was performed.  Right femoral angiogram  with femoral run off was performed through the arterial access sheath.  Then, the pigtail catheter was utilized to cross over from the right femoral  artery to the left iliac artery.   TECHNIQUES OF INTERVENTION:  A 7 French Terumo sheath was introduced over  the [place] wire crossing from the right femoral artery to the left common  femoral artery and into the superficial femoral artery.  The tip of this  sheath was carefully placed into the proximal SFA.  Then, using a glide tip  5 French Indole catheter, the catheter was advanced through the occluded SFA  and easily able to cross through the occluded lesion and I put the catheter  into the popliteal artery and popliteal arteriogram was performed.  Then,  using a Wholey that was made into an exchange length, the Indole catheter  was withdrawn.  Then, a 6 by 100 mm Opta balloon was utilized to perform two  balloon inflations into the distal SFA.  Although I had good results because  of dissection, I proceeded with stenting with a 7 by 150 mm self-expanding  stent.  Post stent deployment, we discovered that there was an 80% stenosis  of the popliteal artery.  Hence, a 6 by 100 mm Opta balloon was readvanced  into the popliteal artery and gentle balloon dilatations at 4 atmospheres of  pressure for 30 seconds was performed.  There was a spiral dissection noted from the outflow of the stent into the popliteal artery.  In spite of a  3  minute inflation at 1 atmospheres of pressure, I decided to proceed with  stenting.  A 6 by 60 mm self-expanding Prodigy stent was carefully  positioned and carefully overlapped and stent was deployed.  Excellent  results were noted post deployment.  Then, the wire was withdrawn,  angiography repeated, and after performing a run off, the Terumo sheath was  carefully pulled back noticing the pressure gradient excel at  various  levels.  Then, the catheter was pulled back into the right femoral artery.  Right femoral angiography was performed to the arterial access sheath and  the access was closed with 6 [place] Starclose with excellent hemostasis.  The patient tolerated the procedure well.  During the procedure, a total of  6000 units of heparin was administered, the ACT was maintained at greater  than 250.  No immediate complications were noted.      Cristy Hilts. Jacinto Halim, MD  Electronically Signed     JRG/MEDQ  D:  10/04/2005  T:  10/04/2005  Job:  [id number]   cc:   Broadus John T. Pamalee Leyden, MD  Fax: [phone]

## 2011-05-13 NOTE — Op Note (Signed)
[name], [name]                ACCOUNT NO.:  [id number]   MEDICAL RECORD NO.:  [id number]          PATIENT TYPE:  AMB   LOCATION:  SDS                          FACILITY:  [facility]   PHYSICIAN:  Vonna Kotyk R. Jacinto Halim, MD       DATE OF BIRTH:  1951-10-20   DATE OF PROCEDURE:  12/27/2005  DATE OF DISCHARGE:                                 OPERATIVE REPORT   REFERRING PHYSICIAN:  Broadus John T. Pamalee Leyden, MD   PROCEDURE:  1.  Left iliac arteriogram.  2.  Right iliac and right femoral arteriogram.  3.  Percutaneous transluminal angioplasty of the right superficial femoral      artery.   INDICATIONS:  Mr. Alphonsus Doyel is a 60 year old gentleman with a history of  hypertension, hyperlipidemia, severe peripheral arterial disease.  He had  been having lifestyle limiting claudication and had undergone PTA and  stenting of the left SFA on October 04, 2005 with placement of a 7 x 150 mm  Protege and a 6 x 60 mm Protege Self-Expanding Stents with excellent  results.  He has known occluded anterior and posterior tibial arteries  bilaterally.  He had mild disease on his right iliac artery and right SFA.  However, because of his increasing symptoms of claudication on the right  lower extremity and a 60-70% stenosis on the left iliac artery, it was felt  that he needs to be brought back to the catheterization to reevaluate his  left iliac artery although his left leg was asymptomatic and also reevaluate  his right SFA.   ANGIOGRAPHIC DATA:  Left iliac artery - left iliac artery is a large caliber  vessel.  The external iliac artery has a 50, at most 60% stenosis with a 10  mmHg pressure gradient with a 7 [place] sheath.  Otherwise, there was brisk  flow noted in the iliac arterial system.   Right iliac artery - right iliac artery is  patent with mild disease.   Right femoral artery with femoral run off.  The right femoral artery with  femoral run off revealed a focal 80-90% stenosis in the mid to distal  SFA.  There was only one-vessel run off noted in the right lower extremity that  included a peroneal artery.  Both the anterior and posterior tibials are  occluded and this is unchanged from prior catheterization.   INTERVENTION DATA:  Successful PTA and balloon angioplasty of the right SFA  with a 5 x 60 mm Agiltrac balloon performed at 7 atmospheric pressure for 90  seconds.  The stenosis was reduced from 90% to less than 10% with brisk flow  noted in the right lower extremity.   RECOMMENDATIONS:  1.  The patient will be continued on aggressive medical therapy.  The left      iliac stenosis, although, is 50 to at most 60%  with the patient being      asymptomatic and only 10 mm pressure gradient through the 7 [place]      sheath.  Will continue medical therapy unless he develops recurrent  symptoms of increasing claudication on the left lower extremity.  Then,      I will consider PTA of the left iliac artery.  2.  The patient will be discharged home today and followed up on an      outpatient basis.  A total of 70 mL of contrast was utilized for      diagnostic and interventional procedure.   TECHNIQUE OF THE PROCEDURE:  Under the usual sterile precautions using a 5  French left femoral venous access and a 7 French left femoral arterial  access, a 45 cm 7 French Terumo sheath was introduced into the lower  abdominal aorta and abdominal aortogram was performed.  Then using a 5  [place] cross-over catheter, the right iliac artery was selectively  cannulated.  Using [facility] wire for support, the Terumo sheath was introduced  into the right femoral artery and angiography of the femoral artery with  femoral run off was performed.   The same sheath was utilized to perform right iliac arteriogram.   INTERVENTIONAL PROCEDURE:  Using 5,000 units of heparin, maintaining ACT of  225 and with the help of the [facility] wire, a 5 x 60 Agiltrac balloon was  utilized to perform angioplasty of the  SFA at 7 atmospheric pressure for 90  seconds.  Post balloon angioplasty, excellent results with brisk run off was  noted.  Given this, it was decided to complete the procedure without  stenting as there was no evidence of any dissection or flap with brisk flow  noted.  Then, the sheath was pulled back into the iliac artery and the right  iliac arteriogram was performed, then pulled back into the left iliac artery  and careful pull back was performed with evaluation of left iliac arterial  system.  There was only a 10 mmHg pressure gradient.  Then, left iliac  arteriogram was performed with the same introducing sheath.  Then, the  catheter was sutured in place and the patient was transferred to the  outpatient recovery in a stable condition.  The patient tolerated the  procedure.  No immediate complications noted.      Cristy Hilts. Jacinto Halim, MD  Electronically Signed     JRG/MEDQ  D:  12/27/2005  T:  12/27/2005  Job:  [id number]   cc:   Broadus John T. Pamalee Leyden, MD  Fax: [phone]

## 2011-05-13 NOTE — Telephone Encounter (Signed)
Called patient he says that his toe is not getting better or worse, is still swollen and painful. Told him that he needs to call the front desk and schedule an appointment to come back and be seen to reevaluate him. Patient agreed.Busick, Rodena Medin

## 2011-05-13 NOTE — Discharge Summary (Signed)
[name], [name]                ACCOUNT NO.:  [id number]   MEDICAL RECORD NO.:  [id number]          PATIENT TYPE:  OBV   LOCATION:  4729                         FACILITY:  [facility]   PHYSICIAN:  Cristy Hilts. Jacinto Halim, MD       DATE OF BIRTH:  July 04, 1951   DATE OF ADMISSION:  10/04/2005  DATE OF DISCHARGE:  10/05/2005                                 DISCHARGE SUMMARY   CHIEF COMPLAINT:  Claudication.   ADMISSION DIAGNOSIS:  1.  Bilateral peripheral vascular occlusive disease with occlusion of the      left dorsalis pedes and posterior tibialis, 50-60% stenosis at the      bifurcation of the anterior tibial posterior tibial trunk, 100%      occlusion of the SFA above the knee.  2.  Peripheral vascular occlusive disease with occlusion of the dorsalis      pedes and posterior tibial open flow to the peroneal with collaterals.  3.  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  4.  Carotid and right subclavian bruits.  5.  Hyperlipidemia.  6.  Ongoing tobacco use.  7.  Hypertension.   DISCHARGE DIAGNOSIS:  1.  Bilateral peripheral vascular occlusive disease with occlusion of the      left dorsalis pedes and posterior tibialis, 50-60% stenosis at the      bifurcation of the anterior tibial posterior tibial trunk, 100%      occlusion of the SFA above the knee.  2.  Peripheral vascular occlusive disease with occlusion of the dorsalis      pedes and posterior tibial open flow to the peroneal with collaterals.  3.  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  4.  Carotid and right subclavian bruits.  5.  Hyperlipidemia.  6.  Ongoing tobacco use.  7.  Hypertension.   PROCEDURES:  Peripheral arteriogram with bilateral lower extremity run off,  PTCA and stenting of the left SFA above the knee.   BRIEF HISTORY:  The patient is a 60 year old black male patient of Dr.  Broadus John T. Pickard who was referred for claudication.   PAST MEDICAL HISTORY:  Asthma, back surgery in  1997, disability secondary to  back injuries on his job.   SOCIAL HISTORY:  He is single for the last five years, no children, he lives  with a friend, he only does a bit of yard work, he drinks alcohol three  times per week and smokes a pack cigarettes a day for at least 20 years.   For further history and physical, please see the dictated note.   HOSPITAL COURSE:  The patient was admitted and underwent angiogram with the  above noted findings.  A PCI and stenting of his SFA using a 7 by 150  Protegee followed by a 6 by 60 mm Protegee stents were used.  The patient  was doing fine the next day, the chest was clear, the cath site looked good,  he was mobilized and discharged home.  He will follow up with Dr. Jacinto Halim in  two weeks.  No labs were obtained during  his hospitalization.  He continues  Glucosamine 3 tablets daily, ibuprofen 800 mg t.i.d., Vytorin 10/40, one  daily, cyclobenzaprine 1 t.i.d., Lotrel 10/20 one daily, aspirin 325 mg  daily, Plavix 75 mg daily.  The patient was encouraged to back down on his  ibuprofen and his cyclobenzaprine since his problem is ischemic and is not  related to muscle pain or an arthritic type of condition.      Eber Hong, P.A.      Cristy Hilts. Jacinto Halim, MD  Electronically Signed    WDJ/MEDQ  D:  10/05/2005  T:  10/05/2005  Job:  [id number]   cc:   Broadus John T. Pamalee Leyden, MD  Fax: [phone]

## 2011-05-13 NOTE — Discharge Summary (Signed)
NAMEBRAYLYN, [name]                ACCOUNT NO.:  [id number]   MEDICAL RECORD NO.:  [id number]          PATIENT TYPE:  INP   LOCATION:  6524                         FACILITY:  [facility]   PHYSICIAN:  Cristy Hilts. Jacinto Halim, MD       DATE OF BIRTH:  [birth year]   DATE OF ADMISSION:  03/07/2007  DATE OF DISCHARGE:  03/08/2007                               DISCHARGE SUMMARY   DISCHARGE DIAGNOSES:  1. Claudication of calf and mid-hip.  2. Peripheral arterial disease with history of PTA and balloon      angioplasty of the right SFA.        A:  Now with new restenosis of the right SFA stent undergoing PTA  successfully.  1. Hyperlipidemia.  2. Hypertension controlled.   DISCHARGE CONDITION:  Improved.   PROCEDURES:  1. Peripheral arteriogram March 07, 2007 by Dr. Yates Decamp.  2. PTA within the re-stenosed stent of the right SFA by Dr. Yates Decamp.   DISCHARGE MEDICATIONS:  1. Vytorin 10/40 one every evening.  2. Lotrel 10/20 one daily.  3. Aspirin 325 mg daily.  4. Prevacid 30 mg daily.  5. Plavix 75 mg daily.   DISCHARGE INSTRUCTIONS:  1. Increase activity slowly.  No lifting for two days.  2. No driving for two days.  3. Low sodium, heart-healthy diet.  4. Wash right groin cath site with soap and water.  Call if any      bleeding, swelling or drainage.  See discharge instructions for      further instructions.  5. Follow up with Dr. Jacinto Halim March 28, 2007 at 2:45 p.m.  6. You are scheduled for followup of lower extremity Dopplers March 14, 2007 at 9:00 a.m.   HISTORY OF PRESENT ILLNESS:  This 60 year old gentleman with known  peripheral arterial disease had undergone balloon angioplasty of his  right SFA December 27, 2005, had done well until recently increasing  claudication.  He had to stop walking and use a cane because of severe  claudication and cramping in his right lower calf.  He also has mild  bilateral hip claudication, but his calf has been more urgent.   FAMILY HISTORY/SOCIAL  HISTORY/REVIEW OF SYSTEMS:  See H&P.   OUTPATIENT MEDICATIONS:  The same as discharge except we added Plavix  and actually none had been added prior to admission.   ALLERGIES:  No known allergies.   Please note patient's Dopplers were positive for stenosis.   HOSPITAL COURSE:  Patient came in as an outpatient.  Procedure:  Underwent PTA for restenosis by Dr. Jacinto Halim, tolerated it well.  By March  13, he was stable.  Extremity:  The right groin cath site with no bruit  and minimal bruising.  Feet were warm.  Claudication was already much  better.  Blood pressure at discharge 109/53, pulse 73, respiratory rate  16, temp 98.3, oxygen saturation 99%.  Heart regular rate and rhythm.  Lungs were clear.  Abdomen was soft.  Hemoglobin 11.8, hematocrit 35.6,  sodium 137, potassium 4.2, creatinine 0.83.  Patient ambulating in the  hall, was seen by Dr. Clarene Duke and discharged home and will follow up with  Dr. Jacinto Halim.      [name]. [place], N.P.      Cristy Hilts. Jacinto Halim, MD  Electronically Signed    LRI/MEDQ  D:  03/08/2007  T:  03/09/2007  Job:  [id number]   cc:   Broadus John T. Pamalee Leyden, MD

## 2011-05-26 ENCOUNTER — Other Ambulatory Visit: Payer: Self-pay | Admitting: Family Medicine

## 2011-05-26 NOTE — Telephone Encounter (Signed)
Refill request for Dr. [place]'s patient. 

## 2011-07-20 ENCOUNTER — Encounter: Payer: Self-pay | Admitting: Family Medicine

## 2011-07-28 ENCOUNTER — Other Ambulatory Visit: Payer: Self-pay | Admitting: Family Medicine

## 2011-07-28 NOTE — Telephone Encounter (Signed)
Refill request

## 2011-07-31 ENCOUNTER — Other Ambulatory Visit: Payer: Self-pay | Admitting: Family Medicine

## 2011-08-01 NOTE — Telephone Encounter (Signed)
Wrong provider

## 2011-08-01 NOTE — Telephone Encounter (Signed)
This came to me in error

## 2011-08-17 ENCOUNTER — Other Ambulatory Visit: Payer: Self-pay | Admitting: Family Medicine

## 2011-08-17 MED ORDER — BUPROPION HCL ER (XL) 150 MG PO TB24: 150.0000 mg | ORAL_TABLET | ORAL | Status: DC

## 2011-09-09 ENCOUNTER — Other Ambulatory Visit: Payer: Self-pay | Admitting: Family Medicine

## 2011-09-09 ENCOUNTER — Telehealth: Payer: Self-pay | Admitting: *Deleted

## 2011-09-09 MED ORDER — NABUMETONE 500 MG PO TABS: 500.0000 mg | ORAL_TABLET | Freq: Every day | ORAL | Status: DC

## 2011-09-09 MED ORDER — GABAPENTIN 800 MG PO TABS: 800.0000 mg | ORAL_TABLET | Freq: Two times a day (BID) | ORAL | Status: DC

## 2011-09-09 NOTE — Progress Notes (Signed)
Called and confirmed rx with pharmacy tech. neurontin and nabumetone were refilled.

## 2011-09-09 NOTE — Telephone Encounter (Signed)
Refill needed on gabapentin

## 2011-09-13 ENCOUNTER — Other Ambulatory Visit: Payer: Self-pay | Admitting: Family Medicine

## 2011-09-13 MED ORDER — NABUMETONE 500 MG PO TABS: 500.0000 mg | ORAL_TABLET | Freq: Every day | ORAL | Status: DC

## 2011-09-14 NOTE — Telephone Encounter (Signed)
Rx has been sent  

## 2011-09-26 LAB — BASIC METABOLIC PANEL
BUN: 10
CO2: 25
Calcium: 9.2
Chloride: 105
Creatinine, Ser: 0.9
GFR calc Af Amer: >60
GFR calc non Af Amer: >60
Glucose, Bld: 102 — ABNORMAL HIGH
Potassium: 4.3
Sodium: 137

## 2011-09-26 LAB — CBC
HCT: 41.9
Hemoglobin: 13.5
MCHC: 32.1
MCV: 73.3 — ABNORMAL LOW
Platelets: 332
RBC: 5.72
RDW: 17.5 — ABNORMAL HIGH
WBC: 7

## 2011-10-07 ENCOUNTER — Ambulatory Visit: Payer: Medicaid Other

## 2011-10-26 ENCOUNTER — Ambulatory Visit (INDEPENDENT_AMBULATORY_CARE_PROVIDER_SITE_OTHER): Payer: Medicaid Other | Admitting: Family Medicine

## 2011-10-26 ENCOUNTER — Encounter: Payer: Self-pay | Admitting: Family Medicine

## 2011-10-26 DIAGNOSIS — M542 Cervicalgia: Secondary | ICD-10-CM

## 2011-10-26 DIAGNOSIS — S0993XA Unspecified injury of face, initial encounter: Secondary | ICD-10-CM

## 2011-10-26 DIAGNOSIS — S199XXA Unspecified injury of neck, initial encounter: Secondary | ICD-10-CM

## 2011-10-26 DIAGNOSIS — S8990XA Unspecified injury of unspecified lower leg, initial encounter: Secondary | ICD-10-CM

## 2011-10-26 DIAGNOSIS — S99929A Unspecified injury of unspecified foot, initial encounter: Secondary | ICD-10-CM

## 2011-10-26 MED ORDER — OXYCODONE-ACETAMINOPHEN 5-325 MG PO TABS: 1.0000 | ORAL_TABLET | Freq: Four times a day (QID) | ORAL | Status: DC | PRN

## 2011-10-26 MED ORDER — GABAPENTIN 800 MG PO TABS: 800.0000 mg | ORAL_TABLET | Freq: Three times a day (TID) | ORAL | Status: DC

## 2011-10-26 NOTE — Progress Notes (Signed)
  Subjective:    Patient ID: [name], male    DOB: 1951/01/23, 60 y.o.   MRN: [id number]  HPI Patient is here for evaluation of neck and shoulder pain. Patient states that he was helping his father move some lumber/wood over the weekend. This event occurred on Saturday. Patient states that he woke up the next day with severe shoulder and arm pain. Patient states the pain has persisted over the past 3-4 days with no relief in pain. Patient describes pain as a shooting pain in his arm associated with numbness and tingling in hands and fingers. Pain is worse with lateral neck movement. Patient also reports a previous history having and pin placement  in his neck by neurosurgery via [facility] 2 years ago. Pt has been using neurontin 800 BID for baseline diabetic neuropathy. This has had a small effect on neck/shoulder pain per pt.  Review of Systems See HPI     Objective:   Physical Exam Gen: up in chair, in moderate distress secondary to pain MSK: spurlings + with bilateral neck movement-laterization of pain to R side, empty can negative, distal extremity strength 5/5, + TTP along medial L scapula   Assessment & Plan:

## 2011-10-26 NOTE — Assessment & Plan Note (Signed)
Highly consistent with cervical radiculopathic etiology. Will increased neurontin to 800mg  tid (previously BID). Will start pt on short course of percocet. Will set up pt for cervical MRI w/o contrast. Given previous hx/o neck surgery, will likely need neurosurgical referral pending MRI. Will reevaluate in 1-2 weeks.

## 2011-10-26 NOTE — Patient Instructions (Signed)
It was good to meet you today  I am setting you up for a MRI for your neck  I am placing you on a short course of narcotics I am increasing your neurontin Depending on the results of your MRI, you may be referred to the Neurosurgeon Come back to see me in 1-2 weeks for a regular PCP visit Call with any questions,  God Bless, Doree Albee MD

## 2011-11-01 ENCOUNTER — Other Ambulatory Visit: Payer: Self-pay | Admitting: Family Medicine

## 2011-11-01 MED ORDER — AMLODIPINE BESY-BENAZEPRIL HCL 10-20 MG PO CAPS: 1.0000 | ORAL_CAPSULE | Freq: Every day | ORAL | Status: DC

## 2011-11-01 MED ORDER — CLOPIDOGREL BISULFATE 75 MG PO TABS: 75.0000 mg | ORAL_TABLET | Freq: Every day | ORAL | Status: DC

## 2011-11-11 ENCOUNTER — Other Ambulatory Visit: Payer: Self-pay | Admitting: Neurosurgery

## 2011-11-11 DIAGNOSIS — G5600 Carpal tunnel syndrome, unspecified upper limb: Secondary | ICD-10-CM

## 2011-11-11 DIAGNOSIS — M542 Cervicalgia: Secondary | ICD-10-CM

## 2011-11-12 ENCOUNTER — Inpatient Hospital Stay: Admission: RE | Admit: 2011-11-12 | Payer: Medicaid Other | Source: Ambulatory Visit

## 2011-11-15 ENCOUNTER — Ambulatory Visit (INDEPENDENT_AMBULATORY_CARE_PROVIDER_SITE_OTHER): Payer: Medicaid Other | Admitting: Family Medicine

## 2011-11-15 ENCOUNTER — Encounter: Payer: Self-pay | Admitting: Family Medicine

## 2011-11-15 VITALS — BP 110/60 | HR 83 | Ht 66.0 in | Wt 207.0 lb

## 2011-11-15 DIAGNOSIS — E119 Type 2 diabetes mellitus without complications: Secondary | ICD-10-CM

## 2011-11-15 DIAGNOSIS — S199XXA Unspecified injury of neck, initial encounter: Secondary | ICD-10-CM

## 2011-11-15 DIAGNOSIS — S0993XA Unspecified injury of face, initial encounter: Secondary | ICD-10-CM

## 2011-11-15 DIAGNOSIS — I10 Essential (primary) hypertension: Secondary | ICD-10-CM

## 2011-11-15 DIAGNOSIS — M542 Cervicalgia: Secondary | ICD-10-CM

## 2011-11-15 LAB — BASIC METABOLIC PANEL
BUN: 12 mg/dL (ref 6–23)
CO2: 22 mEq/L (ref 19–32)
Calcium: 9.6 mg/dL (ref 8.4–10.5)
Chloride: 105 mEq/L (ref 96–112)
Creat: 0.93 mg/dL (ref 0.50–1.35)
Glucose, Bld: 102 mg/dL — ABNORMAL HIGH (ref 70–99)
Potassium: 3.8 mEq/L (ref 3.5–5.3)
Sodium: 139 mEq/L (ref 135–145)

## 2011-11-15 LAB — POCT GLYCOSYLATED HEMOGLOBIN (HGB A1C): Hemoglobin A1C: 6.9

## 2011-11-15 MED ORDER — GABAPENTIN 600 MG PO TABS: ORAL_TABLET | ORAL | Status: DC

## 2011-11-15 MED ORDER — OXYCODONE-ACETAMINOPHEN 5-325 MG PO TABS: 1.0000 | ORAL_TABLET | Freq: Four times a day (QID) | ORAL | Status: DC | PRN

## 2011-11-15 NOTE — Patient Instructions (Signed)
It was good to see you today  Stop the Ultram Take 1200 mg of Neurontin at night Be sure to get your MRI Depending what the MRI shows you will likely be referred to neurosurgery Come back to see in 2-3 weeks,  Call with any questions,  God Bless, Doree Albee MD

## 2011-11-15 NOTE — Assessment & Plan Note (Signed)
Pt still pending cervical MRI. Insurance is now cleared. Will set this up formally. Percocet continued. Neurontin increased (1200mg  at night with 800 in am and pm). Will follow up in 2-3 weeks pending results with likely referral to neurosurgery in setting of hx/o cervical spine surgery in the past.

## 2011-11-15 NOTE — Assessment & Plan Note (Signed)
A1C today. Will adjust regimen pending A1C.

## 2011-11-15 NOTE — Progress Notes (Signed)
  Subjective:    Patient ID: [name], male    DOB: Jan 05, 1951, 60 y.o.   MRN: [id number]  HPI Pt is here for follow up of cervical radiculopathy.  Pt had planned cervical MRI that pt has not gone to 2/2 insurance issues.  Pt states that pain is essentially unchanged since last clinical visit. Increased neurontin has helped alleviate pain some. Percocet has also been effective in managing pain. Pt has been compliant with neurontin 800 tid and percocet q6 hours.  Weakness has also persisted.  There has been no deterioration in function per pt.   DM: Checking CBGs?:intermittently  How Often?:intermittently  Blood Sugar Range:100s-200s Polyuria, polydypsia, polyphagia:no Symptomatic Hypoglycemia:no Medication Compliance?:yes per pt ACE if hypertensive/obesity?:no Statin if LDL >100?:yes   Review of Systems See HPI     Objective:   Physical Exam Physical Exam  Gen: up in chair, in moderate distress secondary to pain  MSK: spurlings + with bilateral neck movement-laterization of pain to R side, empty can negative, distal extremity strength 5/5, + TTP along medial L scapula, no focal sensory deficits    Assessment & Plan:

## 2011-11-18 ENCOUNTER — Ambulatory Visit
Admission: RE | Admit: 2011-11-18 | Discharge: 2011-11-18 | Disposition: A | Discharge status: Home / Self Care | Payer: Medicaid Other | Source: Ambulatory Visit | Attending: Family Medicine | Admitting: Family Medicine

## 2011-11-18 DIAGNOSIS — S199XXA Unspecified injury of neck, initial encounter: Secondary | ICD-10-CM

## 2011-11-27 ENCOUNTER — Other Ambulatory Visit: Payer: Self-pay | Admitting: Family Medicine

## 2011-11-28 NOTE — Telephone Encounter (Signed)
Refill request

## 2011-11-30 ENCOUNTER — Ambulatory Visit (INDEPENDENT_AMBULATORY_CARE_PROVIDER_SITE_OTHER): Payer: Medicaid Other | Admitting: Family Medicine

## 2011-11-30 ENCOUNTER — Encounter: Payer: Self-pay | Admitting: Family Medicine

## 2011-11-30 VITALS — BP 104/66 | HR 86 | Temp 98.1°F | Ht 66.0 in | Wt 207.0 lb

## 2011-11-30 DIAGNOSIS — M542 Cervicalgia: Secondary | ICD-10-CM

## 2011-11-30 DIAGNOSIS — S199XXA Unspecified injury of neck, initial encounter: Secondary | ICD-10-CM

## 2011-11-30 DIAGNOSIS — M5412 Radiculopathy, cervical region: Secondary | ICD-10-CM

## 2011-11-30 DIAGNOSIS — S0993XA Unspecified injury of face, initial encounter: Secondary | ICD-10-CM

## 2011-11-30 DIAGNOSIS — Z23 Encounter for immunization: Secondary | ICD-10-CM

## 2011-11-30 MED ORDER — OXYCODONE-ACETAMINOPHEN 5-325 MG PO TABS: 1.0000 | ORAL_TABLET | Freq: Three times a day (TID) | ORAL | Status: DC | PRN

## 2011-11-30 NOTE — Patient Instructions (Signed)
It was good to see you today I am referring you to the neurossurgeon for your neck pain Follow up with me after the new year to discuss your other issues Call with any other questions,  God Bless, Doree Albee MD

## 2011-11-30 NOTE — Assessment & Plan Note (Addendum)
Given MRI results and and hx/o c-spine surgery, will formally re-refer to neurosurgey for this. Percocet 5/325 # 45 filled today. Discussed with pt using this only on PRN basis for breakthrough pain. Will maintain current dose of Neurontin.

## 2011-11-30 NOTE — Progress Notes (Signed)
S:  Pt is here for follow up evaluation of neck pain.  Recent MRI shows: 1. 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  2. Progressive facet arthritis at C2-3 on the right and at C6-7 on  the left.  3. The left screw at C7 has become dislodged into the anterior  soft tissues posterior lateral to the trachea. Today pt states that pain has been essentially unchanged. Neurontin has helped some radicular sxs including numbness and tingling. Pt has still had persistent neck and shoulder pain. Pt has been using percocet 5/325 q6-8 hours for pain with minimal to moderate relief in sxs.       O:  Current outpatient prescriptions:amLODipine-benazepril (LOTREL) 10-20 MG per capsule, Take 1 capsule by mouth daily., Disp: 30 capsule, Rfl: 6;  aspirin 325 MG tablet, Take 325 mg by mouth daily. Take 2 tablets daily , Disp: , Rfl: ;  Blood Glucose Monitoring Suppl (BLOOD GLUCOSE MONITOR KIT) KIT, check blood sugar daily, sometimes in AM before eating, and sometimes 2 hours after a meal, and record , Disp: , Rfl:  buPROPion (WELLBUTRIN XL) 150 MG 24 hr tablet, Take 1 tablet (150 mg total) by mouth every morning., Disp: 30 tablet, Rfl: 3;  BYSTOLIC 20 MG TABS, 1 BY MOUTH DAILY FOR HIGH BLOOD PRESSURE, Disp: 30 tablet, Rfl: 3;  BYSTOLIC 20 MG TABS, 1 BY MOUTH DAILY FOR HIGH BLOOD PRESSURE, Disp: 30 tablet, Rfl: 3;  cilostazol (PLETAL) 100 MG tablet, Take 100 mg by mouth 2 (two) times daily.  , Disp: , Rfl:  clopidogrel (PLAVIX) 75 MG tablet, Take 1 tablet (75 mg total) by mouth daily., Disp: 30 tablet, Rfl: 6;  gabapentin (NEURONTIN) 600 MG tablet, 800 mg in am and afternoon; 1200 mg at night, Disp: 200 tablet, Rfl: 6;  glipiZIDE (GLUCOTROL) 5 MG tablet, TAKE 1 TABLET BY MOUTH DAILY, Disp: 30 tablet, Rfl: 3;  metFORMIN (GLUCOPHAGE) 500 MG tablet, TAKE 1 TABLET BY MOUTH 2 TIMES A DAY, Disp: 60 tablet, Rfl: 6 nabumetone (RELAFEN) 500 MG tablet, Take 1 tablet (500 mg total) by mouth daily., Disp: 30 tablet, Rfl: 3;   oxyCODONE-acetaminophen (ROXICET) 5-325 MG per tablet, Take 1 tablet by mouth every 6 (six) hours as needed for pain., Disp: 30 tablet, Rfl: 0;  pantoprazole (PROTONIX) 40 MG tablet, Take 40 mg by mouth daily.  , Disp: , Rfl: ;  traMADol (ULTRAM) 50 MG tablet, 1 BY MOUTH TWO TIMES A DAY AS NEEDED PAIN, Disp: 60 tablet, Rfl: 3 VYTORIN 10-40 MG per tablet, TAKE 1 TABLET BY MOUTH DAILY, Disp: 30 tablet, Rfl: 5  Wt Readings from Last 3 Encounters:  11/30/11 207 lb (93.895 kg)  11/15/11 207 lb (93.895 kg)  10/26/11 209 lb 9.6 oz (95.074 kg)   Temp Readings from Last 3 Encounters:  11/30/11 98.1 F (36.7 C) Oral  05/13/11 97.8 F (36.6 C) Oral  05/09/11 98 F (36.7 C) Oral   BP Readings from Last 3 Encounters:  11/30/11 104/66  11/15/11 110/60  10/26/11 140/80   Pulse Readings from Last 3 Encounters:  11/30/11 86  11/15/11 83  10/26/11 88    Physical Exam  Gen: up in chair, in moderate distress secondary to pain  MSK: spurlings + with bilateral neck movement-laterization of pain to R side, empty can negative, distal extremity strength 5/5, + TTP along medial L scapula, no focal sensory deficits    A/P:

## 2011-12-01 ENCOUNTER — Other Ambulatory Visit: Payer: Self-pay | Admitting: Family Medicine

## 2011-12-01 NOTE — Telephone Encounter (Signed)
Refill request

## 2011-12-12 ENCOUNTER — Telehealth: Payer: Self-pay | Admitting: *Deleted

## 2011-12-12 NOTE — Telephone Encounter (Signed)
Received call from [facility] needing clarificiation on gabapentin RX. The strength is 600mg  with directions to take 800 mg in AM and afternoon and 1200 mg in PM. Rx was for 200 tabs .   They need to verify dosage and see how MD wants him to take. Will forward to MD.  Dr. Alvester Morin notified and he will call pharmacy after lunch. Gabapentin comes in 400 mg capsules. Also comes is 100 mg, 300 mg .

## 2011-12-12 NOTE — Telephone Encounter (Signed)
Called and clarified Rx. Tabs changed to 400mg .

## 2012-01-11 ENCOUNTER — Ambulatory Visit: Payer: Medicaid Other | Admitting: Family Medicine

## 2012-01-18 ENCOUNTER — Telehealth: Payer: Self-pay | Admitting: Family Medicine

## 2012-01-18 ENCOUNTER — Ambulatory Visit (INDEPENDENT_AMBULATORY_CARE_PROVIDER_SITE_OTHER): Payer: Medicaid Other | Admitting: Family Medicine

## 2012-01-18 ENCOUNTER — Ambulatory Visit
Admission: RE | Admit: 2012-01-18 | Discharge: 2012-01-18 | Disposition: A | Discharge status: Home / Self Care | Payer: Medicaid Other | Source: Ambulatory Visit | Attending: Family Medicine | Admitting: Family Medicine

## 2012-01-18 ENCOUNTER — Encounter: Payer: Self-pay | Admitting: Family Medicine

## 2012-01-18 DIAGNOSIS — M542 Cervicalgia: Secondary | ICD-10-CM | POA: Diagnosis not present

## 2012-01-18 DIAGNOSIS — R05 Cough: Secondary | ICD-10-CM | POA: Diagnosis not present

## 2012-01-18 DIAGNOSIS — R059 Cough, unspecified: Secondary | ICD-10-CM | POA: Diagnosis not present

## 2012-01-18 DIAGNOSIS — E119 Type 2 diabetes mellitus without complications: Secondary | ICD-10-CM

## 2012-01-18 DIAGNOSIS — M5412 Radiculopathy, cervical region: Secondary | ICD-10-CM | POA: Diagnosis not present

## 2012-01-18 DIAGNOSIS — R1310 Dysphagia, unspecified: Secondary | ICD-10-CM | POA: Diagnosis not present

## 2012-01-18 DIAGNOSIS — Z23 Encounter for immunization: Secondary | ICD-10-CM

## 2012-01-18 DIAGNOSIS — J069 Acute upper respiratory infection, unspecified: Secondary | ICD-10-CM

## 2012-01-18 LAB — POCT GLYCOSYLATED HEMOGLOBIN (HGB A1C): Hemoglobin A1C: 6.6

## 2012-01-18 NOTE — Telephone Encounter (Signed)
Labs faxed to Dr. Mann

## 2012-01-18 NOTE — Patient Instructions (Addendum)
It was good to see you again Wait until the results of your nerve conduction study before making a decision about your neurosurgical options. Be sure to discuss with Dr. Loreta Ave your trouble swallowing, especially given the loose pin in your neck. You may benefit from an upper endoscopy to rule out other causes.  I would like to get a Chest x ray I am checking your blood sugar today  Come back to see me in 4-6 weeks,  Call with any other questions,  God Bless,  Doree Albee MD   Viral Infections A viral infection can be caused by different types of viruses.Most viral infections are not serious and resolve on their own. However, some infections may cause severe symptoms and may lead to further complications. SYMPTOMS Viruses can frequently cause:  Minor sore throat.   Aches and pains.   Headaches.   Runny nose.   Different types of rashes.   Watery eyes.   Tiredness.   Cough.   Loss of appetite.   Gastrointestinal infections, resulting in nausea, vomiting, and diarrhea.  These symptoms do not respond to antibiotics because the infection is not caused by bacteria. However, you might catch a bacterial infection following the viral infection. This is sometimes called a "superinfection." Symptoms of such a bacterial infection may include:  Worsening sore throat with pus and difficulty swallowing.   Swollen neck glands.   Chills and a high or persistent fever.   Severe headache.   Tenderness over the sinuses.   Persistent overall ill feeling (malaise), muscle aches, and tiredness (fatigue).   Persistent cough.   Yellow, green, or brown mucus production with coughing.  HOME CARE INSTRUCTIONS   Only take over-the-counter or prescription medicines for pain, discomfort, diarrhea, or fever as directed by your caregiver.   Drink enough water and fluids to keep your urine clear or pale yellow. Sports drinks can provide valuable electrolytes, sugars, and hydration.   Get  plenty of rest and maintain proper nutrition. Soups and broths with crackers or rice are fine.  SEEK IMMEDIATE MEDICAL CARE IF:   You have severe headaches, shortness of breath, chest pain, neck pain, or an unusual rash.   You have uncontrolled vomiting, diarrhea, or you are unable to keep down fluids.   You or your child has an oral temperature above 102 F (38.9 C), not controlled by medicine.   Your baby is older than 3 months with a rectal temperature of 102 F (38.9 C) or higher.   Your baby is 36 months old or younger with a rectal temperature of 100.4 F (38 C) or higher.  MAKE SURE YOU:   Understand these instructions.   Will watch your condition.   Will get help right away if you are not doing well or get worse.  Document Released: 09/21/2005 Document Revised: 08/24/2011 Document Reviewed: 04/18/2011 [facility] Patient Information 2012 [place], [place].

## 2012-01-18 NOTE — Assessment & Plan Note (Signed)
Nerve conduction study performed today  [ordered by Dr. Venetia Maxon [facility])]. Pain is otherwise well controlled. Pt asked my opinion of overall situation as to whether he should seek second opinion from a different neurosurgeon. Discussed with pt that as pain is otherwise stable, that it may be worth to find out results of nerve conduction study as well as plan from that point. Pt appreciable of input. No medication changes today.

## 2012-01-18 NOTE — Telephone Encounter (Signed)
Needs the most recent labs faxed to [phone].

## 2012-01-18 NOTE — Progress Notes (Signed)
S:  Pt is here for general follow up visit:  Neck Pain: Pt was seen by  Myself on 10/31 for neck pain with radicular sxs. Pt was noted to have a history of neck surgery via Dr. Venetia Maxon at [facility] in the past. Pt was subsequently re-referred to Dr. Venetia Maxon for management of this in setting of hx/o neck surgery and an MRI concerning for a loose left screw at C7 . At this visit pt was placed on percocet and higher dose neurontin for pain management. Per pt, he has been evaluated by Dr Venetia Maxon since our initial visit 09/2011. Pt states that he has been overall unhappy with his care with Dr. Venetia Maxon so far because, per pt, Dr. Venetia Maxon feels that his symptoms are not coming from the loose screw. A nerve study was done earlier today. Pt is here today because he wants advice as to whether or not he may need a second opinion.  From a pain and function standpoint, pt states that pain has been overall well controlled on current regimen of percocet and neurontin. No acute flares of pain. Pt is able to sleep at night. Pt states that he has had some issues with dysphagia and trouble swallowing pills to where he has to drink extra cups of water to clear pills. Pt states that this has been going on since late November. Pt denies any worsening in sxs, but states that symptom of dysphagia has been persistent. Pt is also noted to have a baseline history reflux. Pt states that he has been compliant with his ppi up until recently because Rx ran out. Pt is also noted to be diabetic and a 2-3 cig/day smoker. No nausea, emesis. Mild intermittent hoarseness. Pt states that he has been eating soft foods because of dysphagia. Pt states that he has an appt tomorrow with his gastroenterologist Dr. Loreta Ave to be set up for colonoscopy. Pt states that this is a regular visit.    URI: Pt reports URI sxs. Sxs include rhinorrhea, nasal congestion, cough, and generalized malaise. No fevers, mild chills intermittently. No significant wheezing.  No known sick contacts. + Smoker.    O:  Current Outpatient Prescriptions  Medication Sig Dispense Refill  . amLODipine-benazepril (LOTREL) 10-20 MG per capsule Take 1 capsule by mouth daily.  30 capsule  6  . aspirin 325 MG tablet Take 325 mg by mouth daily. Take 2 tablets daily       . Blood Glucose Monitoring Suppl (BLOOD GLUCOSE MONITOR KIT) KIT check blood sugar daily, sometimes in AM before eating, and sometimes 2 hours after a meal, and record       . buPROPion (WELLBUTRIN XL) 150 MG 24 hr tablet Take 1 tablet (150 mg total) by mouth every morning.  30 tablet  3  . BYSTOLIC 20 MG TABS 1 BY MOUTH DAILY FOR HIGH BLOOD PRESSURE  30 tablet  3  . cilostazol (PLETAL) 100 MG tablet Take 100 mg by mouth 2 (two) times daily.        . clopidogrel (PLAVIX) 75 MG tablet Take 1 tablet (75 mg total) by mouth daily.  30 tablet  6  . gabapentin (NEURONTIN) 600 MG tablet 800 mg in am and afternoon; 1200 mg at night  200 tablet  6  . glipiZIDE (GLUCOTROL) 5 MG tablet TAKE 1 TABLET BY MOUTH DAILY  30 tablet  3  . metFORMIN (GLUCOPHAGE) 500 MG tablet TAKE 1 TABLET BY MOUTH 2 TIMES A DAY  60 tablet  6  . oxyCODONE-acetaminophen (ROXICET) 5-325 MG per tablet Take 1 tablet by mouth every 8 (eight) hours as needed for pain.  45 tablet  0  . pantoprazole (PROTONIX) 40 MG tablet Take 40 mg by mouth daily.        Marland Kitchen VYTORIN 10-40 MG per tablet TAKE 1 TABLET BY MOUTH DAILY  30 tablet  5  . BYSTOLIC 20 MG TABS TAKE 1 TABLET BY MOUTH DAILY FOR HIGH BLOOD PRESSURE  30 tablet  3  . nabumetone (RELAFEN) 500 MG tablet Take 1 tablet (500 mg total) by mouth daily.  30 tablet  3  . traMADol (ULTRAM) 50 MG tablet 1 BY MOUTH TWO TIMES A DAY AS NEEDED PAIN  60 tablet  3    Wt Readings from Last 3 Encounters:  01/18/12 209 lb (94.802 kg)  11/30/11 207 lb (93.895 kg)  11/15/11 207 lb (93.895 kg)   Temp Readings from Last 3 Encounters:  11/30/11 98.1 F (36.7 C) Oral  05/13/11 97.8 F (36.6 C) Oral  05/09/11 98 F  (36.7 C) Oral   BP Readings from Last 3 Encounters:  01/18/12 111/74  11/30/11 104/66  11/15/11 110/60   Pulse Readings from Last 3 Encounters:  01/18/12 72  11/30/11 86  11/15/11 83   Gen: up in chair, NAD HEENT: NCAT, EOMI, TMs clear bilaterally, +nasal erythema, rhinorrhea bilaterally, + post oropharyngeal erythema  CV: RRR, no murmurs auscultated PULM: good overall air movement, faint rales in bases bilaterally ABD: S/NT/+ bowel sounds  EXT: 2+ peripheral pulses    A/P:

## 2012-01-19 ENCOUNTER — Other Ambulatory Visit: Payer: Self-pay | Admitting: Gastroenterology

## 2012-01-19 DIAGNOSIS — R1319 Other dysphagia: Secondary | ICD-10-CM | POA: Diagnosis not present

## 2012-01-19 DIAGNOSIS — K219 Gastro-esophageal reflux disease without esophagitis: Secondary | ICD-10-CM | POA: Diagnosis not present

## 2012-01-19 DIAGNOSIS — Z1211 Encounter for screening for malignant neoplasm of colon: Secondary | ICD-10-CM | POA: Diagnosis not present

## 2012-01-19 DIAGNOSIS — D509 Iron deficiency anemia, unspecified: Secondary | ICD-10-CM | POA: Diagnosis not present

## 2012-01-19 DIAGNOSIS — R635 Abnormal weight gain: Secondary | ICD-10-CM | POA: Diagnosis not present

## 2012-01-19 DIAGNOSIS — K5900 Constipation, unspecified: Secondary | ICD-10-CM | POA: Diagnosis not present

## 2012-01-19 DIAGNOSIS — R1310 Dysphagia, unspecified: Secondary | ICD-10-CM | POA: Insufficient documentation

## 2012-01-19 NOTE — Assessment & Plan Note (Signed)
Broad differential for this including reflux, diabetic gastroparesis, and malignancy.  Smoking history is a concern. Given loose pin at C7 there is also a concern that dysphagia may be coming from surgical hardware. Per pt, he has a clinical visit with Dr. Loreta Ave with GI. Instructed pt on importance of broaching this issue at this visit as pt may benefit from endoscopy vs. Barium swallow. Gave pt physical instructions expressing my concern. Instructed pt to give this to Dr. Loreta Ave at visit tomorrow. Will also fax this visit to GI office. GI red flags discussed at length. Will follow prn.

## 2012-01-19 NOTE — Assessment & Plan Note (Signed)
Checking A1C today. Has been overall well controlled to date. Continue pending regimen pending results.

## 2012-01-19 NOTE — Assessment & Plan Note (Signed)
Likely viral process. Given faint rales at bases and smoking history, will obtain CXR to r/o PNA. Discussed supportive care and infectious red flags. WIll follow up pending CXR. Handout given.

## 2012-01-24 ENCOUNTER — Ambulatory Visit
Admission: RE | Admit: 2012-01-24 | Discharge: 2012-01-24 | Disposition: A | Discharge status: Home / Self Care | Payer: Medicaid Other | Source: Ambulatory Visit | Attending: Gastroenterology | Admitting: Gastroenterology

## 2012-01-25 ENCOUNTER — Other Ambulatory Visit: Payer: Self-pay | Admitting: Family Medicine

## 2012-01-25 NOTE — Telephone Encounter (Signed)
Refill request

## 2012-01-31 ENCOUNTER — Other Ambulatory Visit: Payer: Self-pay | Admitting: Family Medicine

## 2012-01-31 NOTE — Telephone Encounter (Signed)
Refill request

## 2012-02-01 ENCOUNTER — Telehealth: Payer: Self-pay | Admitting: Family Medicine

## 2012-02-01 NOTE — Telephone Encounter (Signed)
Pt needs rx refill for the following: Metformin 500mg , Nabumetone 500mg , Pantoprazole 40mg , and ?Vytorin. Call pt for questions.

## 2012-02-01 NOTE — Telephone Encounter (Signed)
Forward to dr Alvester Morin

## 2012-02-07 ENCOUNTER — Other Ambulatory Visit: Payer: Self-pay | Admitting: Family Medicine

## 2012-02-07 MED ORDER — GLIPIZIDE 5 MG PO TABS: 5.0000 mg | ORAL_TABLET | Freq: Two times a day (BID) | ORAL | Status: DC

## 2012-02-07 MED ORDER — CLOPIDOGREL BISULFATE 75 MG PO TABS: 75.0000 mg | ORAL_TABLET | Freq: Every day | ORAL | Status: DC

## 2012-02-07 MED ORDER — METFORMIN HCL 500 MG PO TABS: 500.0000 mg | ORAL_TABLET | Freq: Two times a day (BID) | ORAL | Status: DC

## 2012-02-07 MED ORDER — AMLODIPINE BESY-BENAZEPRIL HCL 10-20 MG PO CAPS: 1.0000 | ORAL_CAPSULE | Freq: Every day | ORAL | Status: DC

## 2012-02-07 NOTE — Telephone Encounter (Signed)
Refill request

## 2012-02-15 ENCOUNTER — Other Ambulatory Visit: Payer: Self-pay | Admitting: Neurosurgery

## 2012-02-15 DIAGNOSIS — M5412 Radiculopathy, cervical region: Secondary | ICD-10-CM | POA: Diagnosis not present

## 2012-03-01 ENCOUNTER — Encounter (HOSPITAL_COMMUNITY): Payer: Self-pay | Admitting: Pharmacy Technician

## 2012-03-01 ENCOUNTER — Encounter (HOSPITAL_COMMUNITY)
Admission: RE | Admit: 2012-03-01 | Discharge: 2012-03-01 | Disposition: A | Discharge status: Home / Self Care | Payer: Medicare Other | Source: Ambulatory Visit | Attending: Neurosurgery | Admitting: Neurosurgery

## 2012-03-01 ENCOUNTER — Other Ambulatory Visit: Payer: Self-pay

## 2012-03-01 DIAGNOSIS — M5030 Other cervical disc degeneration, unspecified cervical region: Secondary | ICD-10-CM | POA: Diagnosis not present

## 2012-03-01 DIAGNOSIS — G5620 Lesion of ulnar nerve, unspecified upper limb: Secondary | ICD-10-CM | POA: Diagnosis not present

## 2012-03-01 DIAGNOSIS — M47812 Spondylosis without myelopathy or radiculopathy, cervical region: Secondary | ICD-10-CM | POA: Diagnosis not present

## 2012-03-01 DIAGNOSIS — M5020 Other cervical disc displacement, unspecified cervical region: Secondary | ICD-10-CM | POA: Diagnosis not present

## 2012-03-01 LAB — BASIC METABOLIC PANEL
BUN: 11 mg/dL (ref 6–23)
CO2: 23 mEq/L (ref 19–32)
Calcium: 9.1 mg/dL (ref 8.4–10.5)
Chloride: 104 mEq/L (ref 96–112)
Creatinine, Ser: 0.86 mg/dL (ref 0.50–1.35)
GFR calc Af Amer: >90 mL/min (ref >90)
GFR calc non Af Amer: >90 mL/min (ref >90)
Glucose, Bld: 75 mg/dL (ref 70–99)
Potassium: 3.6 mEq/L (ref 3.5–5.1)
Sodium: 137 mEq/L (ref 135–145)

## 2012-03-01 LAB — CBC
HCT: 31.5 % — ABNORMAL LOW (ref 39.0–52.0)
Hemoglobin: 9.9 g/dL — ABNORMAL LOW (ref 13.0–17.0)
MCH: 20.8 pg — ABNORMAL LOW (ref 26.0–34.0)
MCHC: 31.4 g/dL (ref 30.0–36.0)
MCV: 66.2 fL — ABNORMAL LOW (ref 78.0–100.0)
Platelets: 304 10*3/uL (ref 150–400)
RBC: 4.76 MIL/uL (ref 4.22–5.81)
RDW: 19.7 % — ABNORMAL HIGH (ref 11.5–15.5)
WBC: 7.6 10*3/uL (ref 4.0–10.5)

## 2012-03-01 LAB — TYPE AND SCREEN
ABO/RH(D): A POS
Antibody Screen: NEGATIVE

## 2012-03-01 LAB — ABO/RH: ABO/RH(D): A POS

## 2012-03-01 LAB — SURGICAL PCR SCREEN
MRSA, PCR: NEGATIVE
Staphylococcus aureus: NEGATIVE

## 2012-03-01 NOTE — Pre-Procedure Instructions (Signed)
20 KAIVON LIVESEY  03/01/2012   Your procedure is scheduled on: Thursday March 08, 2012 at 0730 am  Report to [facility] at 0530 AM.  Call this number if you have problems the morning of surgery: [phone]   Remember:   Do not eat food:After Midnight.  May have clear liquids: up to 4 Hours before arrival until 0130 am.  Clear liquids include soda, tea, black coffee, apple or grape juice, broth.  Take these medicines the morning of surgery with A SIP OF WATER: Amlodipine, wellbutrin, Bystolic, Neurontin, Protonix, Tramadol and Oxycodone if needed for pain.   Do not wear jewelry, make-up or nail polish.  Do not wear lotions, powders, or perfumes. You may wear deodorant.  Do not shave 48 hours prior to surgery.  Do not bring valuables to the hospital.  Contacts, dentures or bridgework may not be worn into surgery.  Leave suitcase in the car. After surgery it may be brought to your room.  For patients admitted to the hospital, checkout time is 11:00 AM the day of discharge.   Patients discharged the day of surgery will not be allowed to drive home.  Name and phone number of your driver:   Special Instructions: CHG Shower Use Special Wash: 1/2 bottle night before surgery and 1/2 bottle morning of surgery.   Please read over the following fact sheets that you were given: Pain Booklet, Coughing and Deep Breathing, Blood Transfusion Information and Surgical Site Infection Prevention

## 2012-03-02 NOTE — Consult Note (Signed)
Anesthesia:  Patient is a 61 year old male scheduled for revision of a C5-7,  C7-T1 ACDF on 03/08/12.  History includes DM2 (Hgb A1C 6.6 12/2011), HLD, arthritis, PAD, GERD, HTN, smoking cervical radiculopathy, and prior back surgery, and recent evaluation with Dr. Doree Albee for dysphagia with plans for him to see Dr. Loreta Ave and Dr. Venetia Maxon.  Esophogram/Barium Swallow Study on 01/24/12 showed: 1. Status post anterior fixation at C5-C7. The left sided screw at C7 is loose and positioned anterior to the remainder of the plate.  2. Mild impression upon the contrast column at the level of the loosened screw, without obstruction to contrast passage.  3. Mild esophageal dysmotility, likely presbyesophagus.   Labs noted.  H/H 9.9/31.5.  T&S already done.  CXR from 01/18/12 showed: 1. No active lung disease.  2. Loosened screw from the lower aspect of the anterior cervical spine fusion plate.   EKG stable showing NSR, minimal voltage criteria for LVH (may be a normal variant).  His Vitals were stable at PAT.  If remains asymptomatic from his anemia, then anticipate he can proceed.

## 2012-03-07 MED ORDER — CEFAZOLIN SODIUM-DEXTROSE 2-3 GM-% IV SOLR
2.0000 g | INTRAVENOUS | Status: AC
  Administered 2012-03-08: 2 g via INTRAVENOUS
  Filled 2012-03-07: qty 50

## 2012-03-08 ENCOUNTER — Inpatient Hospital Stay (HOSPITAL_COMMUNITY): Payer: Medicare Other | Admitting: Vascular Surgery

## 2012-03-08 ENCOUNTER — Encounter (HOSPITAL_COMMUNITY)
Admission: RE | Disposition: A | Discharge status: Home / Self Care | Payer: Self-pay | Source: Ambulatory Visit | Attending: Neurosurgery

## 2012-03-08 ENCOUNTER — Encounter (HOSPITAL_COMMUNITY): Payer: Self-pay | Admitting: Vascular Surgery

## 2012-03-08 ENCOUNTER — Encounter (HOSPITAL_COMMUNITY): Payer: Self-pay | Admitting: Anesthesiology

## 2012-03-08 ENCOUNTER — Encounter (HOSPITAL_COMMUNITY): Payer: Self-pay | Admitting: *Deleted

## 2012-03-08 ENCOUNTER — Inpatient Hospital Stay (HOSPITAL_COMMUNITY)
Admission: RE | Admit: 2012-03-08 | Discharge: 2012-03-10 | DRG: 030 | Disposition: A | Discharge status: Home / Self Care | Payer: Medicare Other | Source: Ambulatory Visit | Attending: Neurosurgery | Admitting: Neurosurgery

## 2012-03-08 ENCOUNTER — Inpatient Hospital Stay (HOSPITAL_COMMUNITY): Payer: Medicare Other

## 2012-03-08 DIAGNOSIS — G5620 Lesion of ulnar nerve, unspecified upper limb: Secondary | ICD-10-CM | POA: Diagnosis not present

## 2012-03-08 DIAGNOSIS — M5020 Other cervical disc displacement, unspecified cervical region: Secondary | ICD-10-CM | POA: Diagnosis present

## 2012-03-08 DIAGNOSIS — IMO0002 Reserved for concepts with insufficient information to code with codable children: Secondary | ICD-10-CM | POA: Diagnosis not present

## 2012-03-08 DIAGNOSIS — M5030 Other cervical disc degeneration, unspecified cervical region: Secondary | ICD-10-CM | POA: Diagnosis not present

## 2012-03-08 DIAGNOSIS — Z981 Arthrodesis status: Secondary | ICD-10-CM | POA: Diagnosis not present

## 2012-03-08 DIAGNOSIS — Z01812 Encounter for preprocedural laboratory examination: Secondary | ICD-10-CM

## 2012-03-08 DIAGNOSIS — M542 Cervicalgia: Secondary | ICD-10-CM

## 2012-03-08 DIAGNOSIS — M5412 Radiculopathy, cervical region: Secondary | ICD-10-CM | POA: Diagnosis not present

## 2012-03-08 DIAGNOSIS — M47812 Spondylosis without myelopathy or radiculopathy, cervical region: Secondary | ICD-10-CM | POA: Diagnosis not present

## 2012-03-08 DIAGNOSIS — M4802 Spinal stenosis, cervical region: Secondary | ICD-10-CM | POA: Diagnosis not present

## 2012-03-08 DIAGNOSIS — E119 Type 2 diabetes mellitus without complications: Secondary | ICD-10-CM

## 2012-03-08 DIAGNOSIS — M5090 Cervical disc disorder, unspecified, unspecified cervical region: Secondary | ICD-10-CM | POA: Diagnosis not present

## 2012-03-08 HISTORY — DX: Low back pain: M54.5

## 2012-03-08 HISTORY — PX: ANTERIOR CERVICAL DECOMP/DISCECTOMY FUSION: SHX1161

## 2012-03-08 HISTORY — DX: Other chronic pain: G89.29

## 2012-03-08 HISTORY — DX: Low back pain, unspecified: M54.50

## 2012-03-08 LAB — GLUCOSE, CAPILLARY
Glucose-Capillary: 151 mg/dL — ABNORMAL HIGH (ref 70–99)
Glucose-Capillary: 128 mg/dL — ABNORMAL HIGH (ref 70–99)
Glucose-Capillary: 192 mg/dL — ABNORMAL HIGH (ref 70–99)

## 2012-03-08 LAB — HEMOGLOBIN A1C
Hgb A1c MFr Bld: 7.6 % — ABNORMAL HIGH (ref <5.7)
Mean Plasma Glucose: 171 mg/dL — ABNORMAL HIGH (ref <117)

## 2012-03-08 SURGERY — ANTERIOR CERVICAL DECOMPRESSION/DISCECTOMY FUSION 1 LEVEL/HARDWARE REMOVAL
Anesthesia: Monitor Anesthesia Care

## 2012-03-08 MED ORDER — ONDANSETRON HCL 4 MG/2ML IJ SOLN: 4.0000 mg | INTRAMUSCULAR | Status: DC | PRN

## 2012-03-08 MED ORDER — SODIUM CHLORIDE 0.9 % IR SOLN
Status: DC | PRN
  Administered 2012-03-08: 10:00:00

## 2012-03-08 MED ORDER — DIAZEPAM 5 MG PO TABS
5.0000 mg | ORAL_TABLET | Freq: Four times a day (QID) | ORAL | Status: DC | PRN
  Administered 2012-03-08 – 2012-03-09 (×2): 5 mg via ORAL
  Filled 2012-03-08: qty 1

## 2012-03-08 MED ORDER — ALUM & MAG HYDROXIDE-SIMETH 200-200-20 MG/5ML PO SUSP: 30.0000 mL | Freq: Four times a day (QID) | ORAL | Status: DC | PRN

## 2012-03-08 MED ORDER — PHENOL 1.4 % MT LIQD: 1.0000 | OROMUCOSAL | Status: DC | PRN

## 2012-03-08 MED ORDER — LIDOCAINE-EPINEPHRINE 1 %-1:100000 IJ SOLN
INTRAMUSCULAR | Status: DC | PRN
  Administered 2012-03-08: 4 mL

## 2012-03-08 MED ORDER — OXYCODONE-ACETAMINOPHEN 5-325 MG PO TABS: 1.0000 | ORAL_TABLET | Freq: Three times a day (TID) | ORAL | Status: DC | PRN

## 2012-03-08 MED ORDER — BUPROPION HCL ER (XL) 150 MG PO TB24
150.0000 mg | ORAL_TABLET | ORAL | Status: DC
  Administered 2012-03-09 – 2012-03-10 (×2): 150 mg via ORAL
  Filled 2012-03-08 (×3): qty 1

## 2012-03-08 MED ORDER — PROPOFOL 10 MG/ML IV EMUL
INTRAVENOUS | Status: DC | PRN
  Administered 2012-03-08: 200 mg via INTRAVENOUS

## 2012-03-08 MED ORDER — MORPHINE SULFATE 4 MG/ML IJ SOLN
1.0000 mg | INTRAMUSCULAR | Status: DC | PRN
  Administered 2012-03-08 – 2012-03-09 (×3): 4 mg via INTRAVENOUS
  Filled 2012-03-08 (×3): qty 1

## 2012-03-08 MED ORDER — EPHEDRINE SULFATE 50 MG/ML IJ SOLN
INTRAMUSCULAR | Status: DC | PRN
  Administered 2012-03-08 (×5): 10 mg via INTRAVENOUS

## 2012-03-08 MED ORDER — LACTATED RINGERS IV SOLN
INTRAVENOUS | Status: DC | PRN
  Administered 2012-03-08 (×2): via INTRAVENOUS

## 2012-03-08 MED ORDER — ACETAMINOPHEN 325 MG PO TABS: 650.0000 mg | ORAL_TABLET | ORAL | Status: DC | PRN

## 2012-03-08 MED ORDER — INSULIN ASPART 100 UNIT/ML ~~LOC~~ SOLN: 0.0000 [IU] | Freq: Every day | SUBCUTANEOUS | Status: DC

## 2012-03-08 MED ORDER — ARTIFICIAL TEARS OP OINT
TOPICAL_OINTMENT | OPHTHALMIC | Status: DC | PRN
  Administered 2012-03-08: 1 via OPHTHALMIC

## 2012-03-08 MED ORDER — EZETIMIBE-SIMVASTATIN 10-40 MG PO TABS: 1.0000 | ORAL_TABLET | ORAL | Status: DC

## 2012-03-08 MED ORDER — PHENYLEPHRINE HCL 10 MG/ML IJ SOLN
10.0000 mg | INTRAVENOUS | Status: DC | PRN
  Administered 2012-03-08: 50 ug/min via INTRAVENOUS

## 2012-03-08 MED ORDER — PHENYLEPHRINE HCL 10 MG/ML IJ SOLN
INTRAMUSCULAR | Status: DC | PRN
  Administered 2012-03-08: 80 ug via INTRAVENOUS

## 2012-03-08 MED ORDER — OXYCODONE-ACETAMINOPHEN 5-325 MG PO TABS
1.0000 | ORAL_TABLET | ORAL | Status: DC | PRN
  Administered 2012-03-08 – 2012-03-09 (×3): 2 via ORAL
  Filled 2012-03-08 (×2): qty 2

## 2012-03-08 MED ORDER — HEMOSTATIC AGENTS (NO CHARGE) OPTIME
TOPICAL | Status: DC | PRN
  Administered 2012-03-08: 1 via TOPICAL

## 2012-03-08 MED ORDER — MORPHINE SULFATE 2 MG/ML IJ SOLN: 0.0500 mg/kg | INTRAMUSCULAR | Status: DC | PRN

## 2012-03-08 MED ORDER — HYDROMORPHONE HCL PF 1 MG/ML IJ SOLN
INTRAMUSCULAR | Status: AC
  Filled 2012-03-08: qty 1

## 2012-03-08 MED ORDER — DIAZEPAM 5 MG PO TABS
ORAL_TABLET | ORAL | Status: AC
  Filled 2012-03-08: qty 1

## 2012-03-08 MED ORDER — GABAPENTIN 400 MG PO CAPS
1200.0000 mg | ORAL_CAPSULE | Freq: Every day | ORAL | Status: DC
  Administered 2012-03-08 – 2012-03-09 (×2): 1200 mg via ORAL
  Filled 2012-03-08 (×3): qty 3

## 2012-03-08 MED ORDER — EZETIMIBE 10 MG PO TABS
10.0000 mg | ORAL_TABLET | Freq: Every day | ORAL | Status: DC
  Administered 2012-03-09 – 2012-03-10 (×2): 10 mg via ORAL
  Filled 2012-03-08 (×2): qty 1

## 2012-03-08 MED ORDER — AMLODIPINE BESYLATE 10 MG PO TABS
10.0000 mg | ORAL_TABLET | Freq: Every day | ORAL | Status: DC
  Administered 2012-03-08 – 2012-03-10 (×3): 10 mg via ORAL
  Filled 2012-03-08 (×3): qty 1

## 2012-03-08 MED ORDER — VECURONIUM BROMIDE 10 MG IV SOLR
INTRAVENOUS | Status: DC | PRN
  Administered 2012-03-08: 4 mg via INTRAVENOUS

## 2012-03-08 MED ORDER — THROMBIN 5000 UNITS EX KIT
PACK | CUTANEOUS | Status: DC | PRN
  Administered 2012-03-08 (×2): 5000 [IU] via TOPICAL

## 2012-03-08 MED ORDER — OXYCODONE-ACETAMINOPHEN 5-325 MG PO TABS
ORAL_TABLET | ORAL | Status: AC
  Filled 2012-03-08: qty 2

## 2012-03-08 MED ORDER — INSULIN ASPART 100 UNIT/ML ~~LOC~~ SOLN
6.0000 [IU] | Freq: Three times a day (TID) | SUBCUTANEOUS | Status: DC
  Administered 2012-03-08 – 2012-03-10 (×2): 6 [IU] via SUBCUTANEOUS

## 2012-03-08 MED ORDER — ONDANSETRON HCL 4 MG/2ML IJ SOLN
INTRAMUSCULAR | Status: DC | PRN
  Administered 2012-03-08: 4 mg via INTRAVENOUS

## 2012-03-08 MED ORDER — FENTANYL CITRATE 0.05 MG/ML IJ SOLN
INTRAMUSCULAR | Status: DC | PRN
  Administered 2012-03-08: 50 ug via INTRAVENOUS
  Administered 2012-03-08: 150 ug via INTRAVENOUS
  Administered 2012-03-08: 50 ug via INTRAVENOUS

## 2012-03-08 MED ORDER — HYDROMORPHONE HCL PF 1 MG/ML IJ SOLN
0.2500 mg | INTRAMUSCULAR | Status: DC | PRN
  Administered 2012-03-08 (×4): 0.5 mg via INTRAVENOUS

## 2012-03-08 MED ORDER — BUPIVACAINE HCL (PF) 0.5 % IJ SOLN
INTRAMUSCULAR | Status: DC | PRN
  Administered 2012-03-08: 4 mL

## 2012-03-08 MED ORDER — ROCURONIUM BROMIDE 100 MG/10ML IV SOLN
INTRAVENOUS | Status: DC | PRN
  Administered 2012-03-08: 50 mg via INTRAVENOUS

## 2012-03-08 MED ORDER — LIDOCAINE HCL 4 % MT SOLN
OROMUCOSAL | Status: DC | PRN
  Administered 2012-03-08: 4 mL via TOPICAL

## 2012-03-08 MED ORDER — TRAMADOL HCL 50 MG PO TABS: 50.0000 mg | ORAL_TABLET | Freq: Four times a day (QID) | ORAL | Status: DC | PRN

## 2012-03-08 MED ORDER — AMLODIPINE BESY-BENAZEPRIL HCL 10-20 MG PO CAPS: 1.0000 | ORAL_CAPSULE | ORAL | Status: DC

## 2012-03-08 MED ORDER — METFORMIN HCL 500 MG PO TABS
500.0000 mg | ORAL_TABLET | Freq: Two times a day (BID) | ORAL | Status: DC
  Administered 2012-03-08 – 2012-03-10 (×4): 500 mg via ORAL
  Filled 2012-03-08 (×6): qty 1

## 2012-03-08 MED ORDER — MIDAZOLAM HCL 5 MG/5ML IJ SOLN
INTRAMUSCULAR | Status: DC | PRN
  Administered 2012-03-08: 2 mg via INTRAVENOUS

## 2012-03-08 MED ORDER — EZETIMIBE-SIMVASTATIN 10-40 MG PO TABS
1.0000 | ORAL_TABLET | Freq: Every day | ORAL | Status: DC
  Filled 2012-03-08: qty 1

## 2012-03-08 MED ORDER — GLYCOPYRROLATE 0.2 MG/ML IJ SOLN
INTRAMUSCULAR | Status: DC | PRN
  Administered 2012-03-08: 0.2 mg via INTRAVENOUS
  Administered 2012-03-08: .6 mg via INTRAVENOUS

## 2012-03-08 MED ORDER — ZOLPIDEM TARTRATE 10 MG PO TABS: 10.0000 mg | ORAL_TABLET | Freq: Every evening | ORAL | Status: DC | PRN

## 2012-03-08 MED ORDER — SODIUM CHLORIDE 0.9 % IJ SOLN: 3.0000 mL | INTRAMUSCULAR | Status: DC | PRN

## 2012-03-08 MED ORDER — BENAZEPRIL HCL 20 MG PO TABS
20.0000 mg | ORAL_TABLET | Freq: Every day | ORAL | Status: DC
  Administered 2012-03-09 – 2012-03-10 (×2): 20 mg via ORAL
  Filled 2012-03-08 (×2): qty 1

## 2012-03-08 MED ORDER — KCL IN DEXTROSE-NACL 20-5-0.45 MEQ/L-%-% IV SOLN
INTRAVENOUS | Status: AC
  Administered 2012-03-08: 1000 mL via INTRAVENOUS
  Filled 2012-03-08: qty 1000

## 2012-03-08 MED ORDER — SODIUM CHLORIDE 0.9 % IV SOLN
INTRAVENOUS | Status: AC
  Filled 2012-03-08: qty 500

## 2012-03-08 MED ORDER — GABAPENTIN 800 MG PO TABS
800.0000 mg | ORAL_TABLET | Freq: Two times a day (BID) | ORAL | Status: DC
  Filled 2012-03-08: qty 1

## 2012-03-08 MED ORDER — SODIUM CHLORIDE 0.9 % IV SOLN
250.0000 mL | INTRAVENOUS | Status: DC
Start: 2012-03-08 — End: 2012-03-10

## 2012-03-08 MED ORDER — ASPIRIN 325 MG PO TABS
325.0000 mg | ORAL_TABLET | ORAL | Status: DC
  Administered 2012-03-09 – 2012-03-10 (×2): 325 mg via ORAL
  Filled 2012-03-08 (×3): qty 1

## 2012-03-08 MED ORDER — HETASTARCH-ELECTROLYTES 6 % IV SOLN
INTRAVENOUS | Status: DC | PRN
  Administered 2012-03-08: 12:00:00 via INTRAVENOUS

## 2012-03-08 MED ORDER — NEOSTIGMINE METHYLSULFATE 1 MG/ML IJ SOLN
INTRAMUSCULAR | Status: DC | PRN
  Administered 2012-03-08: 4 mg via INTRAVENOUS

## 2012-03-08 MED ORDER — CEFAZOLIN SODIUM 1-5 GM-% IV SOLN
1.0000 g | Freq: Three times a day (TID) | INTRAVENOUS | Status: AC
  Administered 2012-03-08 – 2012-03-09 (×2): 1 g via INTRAVENOUS
  Filled 2012-03-08 (×3): qty 50

## 2012-03-08 MED ORDER — GABAPENTIN 400 MG PO CAPS
800.0000 mg | ORAL_CAPSULE | Freq: Two times a day (BID) | ORAL | Status: DC
  Administered 2012-03-09 – 2012-03-10 (×3): 800 mg via ORAL
  Filled 2012-03-08 (×4): qty 2

## 2012-03-08 MED ORDER — ACETAMINOPHEN 650 MG RE SUPP: 650.0000 mg | RECTAL | Status: DC | PRN

## 2012-03-08 MED ORDER — BACITRACIN 50000 UNITS IM SOLR
INTRAMUSCULAR | Status: AC
  Filled 2012-03-08: qty 1

## 2012-03-08 MED ORDER — ATORVASTATIN CALCIUM 20 MG PO TABS
20.0000 mg | ORAL_TABLET | Freq: Every day | ORAL | Status: DC
  Administered 2012-03-08 – 2012-03-09 (×2): 20 mg via ORAL
  Filled 2012-03-08 (×3): qty 1

## 2012-03-08 MED ORDER — SODIUM CHLORIDE 0.9 % IJ SOLN
3.0000 mL | Freq: Two times a day (BID) | INTRAMUSCULAR | Status: DC
  Administered 2012-03-08 – 2012-03-09 (×2): 3 mL via INTRAVENOUS

## 2012-03-08 MED ORDER — INSULIN ASPART 100 UNIT/ML ~~LOC~~ SOLN
0.0000 [IU] | Freq: Three times a day (TID) | SUBCUTANEOUS | Status: DC
  Administered 2012-03-08 – 2012-03-09 (×3): 3 [IU] via SUBCUTANEOUS

## 2012-03-08 MED ORDER — BUPIVACAINE HCL (PF) 0.25 % IJ SOLN: INTRAMUSCULAR | Status: DC | PRN

## 2012-03-08 MED ORDER — HYDROCODONE-ACETAMINOPHEN 5-325 MG PO TABS
1.0000 | ORAL_TABLET | ORAL | Status: DC | PRN
  Administered 2012-03-10 (×2): 2 via ORAL
  Filled 2012-03-08 (×2): qty 2

## 2012-03-08 MED ORDER — MENTHOL 3 MG MT LOZG
1.0000 | LOZENGE | OROMUCOSAL | Status: DC | PRN
  Filled 2012-03-08: qty 9

## 2012-03-08 MED ORDER — KCL IN DEXTROSE-NACL 20-5-0.45 MEQ/L-%-% IV SOLN
INTRAVENOUS | Status: DC
  Administered 2012-03-08: 1000 mL via INTRAVENOUS
  Filled 2012-03-08 (×6): qty 1000

## 2012-03-08 MED ORDER — LACTATED RINGERS IV SOLN: INTRAVENOUS | Status: DC

## 2012-03-08 MED ORDER — 0.9 % SODIUM CHLORIDE (POUR BTL) OPTIME
TOPICAL | Status: DC | PRN
  Administered 2012-03-08: 1000 mL

## 2012-03-08 MED ORDER — DEXAMETHASONE SODIUM PHOSPHATE 10 MG/ML IJ SOLN
INTRAMUSCULAR | Status: AC
  Administered 2012-03-08: 10 mg via INTRAVENOUS
  Filled 2012-03-08: qty 1

## 2012-03-08 SURGICAL SUPPLY — 79 items
ADH SKN CLS APL DERMABOND .7 (GAUZE/BANDAGES/DRESSINGS) ×1
APL SKNCLS STERI-STRIP NONHPOA (GAUZE/BANDAGES/DRESSINGS)
BAG DECANTER FOR FLEXI CONT (MISCELLANEOUS) ×2 IMPLANT
BANDAGE GAUZE ELAST BULKY 4 IN (GAUZE/BANDAGES/DRESSINGS) ×2 IMPLANT
BENZOIN TINCTURE PRP APPL 2/3 (GAUZE/BANDAGES/DRESSINGS) IMPLANT
BIT DRILL 14MM (INSTRUMENTS) IMPLANT
BIT DRILL NEURO 2X3.1 SFT TUCH (MISCELLANEOUS) ×1 IMPLANT
BLADE SURG 15 STRL LF DISP TIS (BLADE) IMPLANT
BLADE SURG 15 STRL SS (BLADE) ×2
BLADE ULTRA TIP 2M (BLADE) ×1 IMPLANT
BONE CERV LORDOTIC 14.5X12X6 (Bone Implant) ×2 IMPLANT
BONE CERV LORDOTIC 14.5X12X8 (Bone Implant) ×2 IMPLANT
BUR BARREL STRAIGHT FLUTE 4.0 (BURR) ×2 IMPLANT
CANISTER SUCTION 2500CC (MISCELLANEOUS) ×2 IMPLANT
CLOTH BEACON ORANGE TIMEOUT ST (SAFETY) ×2 IMPLANT
CONT SPEC 4OZ CLIKSEAL STRL BL (MISCELLANEOUS) ×2 IMPLANT
COVER MAYO STAND STRL (DRAPES) ×2 IMPLANT
DERMABOND ADVANCED (GAUZE/BANDAGES/DRESSINGS) ×1
DERMABOND ADVANCED .7 DNX12 (GAUZE/BANDAGES/DRESSINGS) ×1 IMPLANT
DRAPE LAPAROTOMY 100X72 PEDS (DRAPES) ×2 IMPLANT
DRAPE MICROSCOPE LEICA (MISCELLANEOUS) ×2 IMPLANT
DRAPE POUCH INSTRU U-SHP 10X18 (DRAPES) ×2 IMPLANT
DRAPE PROXIMA HALF (DRAPES) IMPLANT
DRESSING TELFA 8X3 (GAUZE/BANDAGES/DRESSINGS) IMPLANT
DRILL 14MM (INSTRUMENTS) ×2
DRILL NEURO 2X3.1 SOFT TOUCH (MISCELLANEOUS) ×2
DURAPREP 6ML APPLICATOR 50/CS (WOUND CARE) ×2 IMPLANT
ELECT COATED BLADE 2.86 ST (ELECTRODE) ×2 IMPLANT
ELECT REM PT RETURN 9FT ADLT (ELECTROSURGICAL) ×2
ELECTRODE REM PT RTRN 9FT ADLT (ELECTROSURGICAL) ×1 IMPLANT
GAUZE SPONGE 4X4 16PLY XRAY LF (GAUZE/BANDAGES/DRESSINGS) IMPLANT
GLOVE BIO SURGEON STRL SZ8 (GLOVE) ×2 IMPLANT
GLOVE BIOGEL PI IND STRL 7.5 (GLOVE) IMPLANT
GLOVE BIOGEL PI IND STRL 8 (GLOVE) ×1 IMPLANT
GLOVE BIOGEL PI IND STRL 8.5 (GLOVE) ×1 IMPLANT
GLOVE BIOGEL PI INDICATOR 7.5 (GLOVE) ×1
GLOVE BIOGEL PI INDICATOR 8 (GLOVE) ×1
GLOVE BIOGEL PI INDICATOR 8.5 (GLOVE) ×3
GLOVE ECLIPSE 7.5 STRL STRAW (GLOVE) ×5 IMPLANT
GLOVE ECLIPSE 8.0 STRL XLNG CF (GLOVE) ×1 IMPLANT
GLOVE EXAM NITRILE LRG STRL (GLOVE) IMPLANT
GLOVE EXAM NITRILE MD LF STRL (GLOVE) IMPLANT
GLOVE EXAM NITRILE XL STR (GLOVE) IMPLANT
GLOVE EXAM NITRILE XS STR PU (GLOVE) IMPLANT
GLOVE SURG SS PI 8.0 STRL IVOR (GLOVE) ×3 IMPLANT
GOWN BRE IMP SLV AUR LG STRL (GOWN DISPOSABLE) ×1 IMPLANT
GOWN BRE IMP SLV AUR XL STRL (GOWN DISPOSABLE) ×1 IMPLANT
GOWN STRL REIN 2XL LVL4 (GOWN DISPOSABLE) ×3 IMPLANT
GRAFT BNE SPCR VG2 14.5X12X6 (Bone Implant) IMPLANT
GRAFT BNE SPCR VG2 14.5X12X8 (Bone Implant) IMPLANT
HEAD HALTER (SOFTGOODS) ×2 IMPLANT
HEMOSTAT SURGICEL 2X14 (HEMOSTASIS) IMPLANT
KIT BASIN OR (CUSTOM PROCEDURE TRAY) ×2 IMPLANT
KIT ROOM TURNOVER OR (KITS) ×2 IMPLANT
NDL HYPO 18GX1.5 BLUNT FILL (NEEDLE) ×1 IMPLANT
NDL HYPO 25X1 1.5 SAFETY (NEEDLE) ×1 IMPLANT
NDL SPNL 22GX3.5 QUINCKE BK (NEEDLE) ×1 IMPLANT
NEEDLE HYPO 18GX1.5 BLUNT FILL (NEEDLE) ×2 IMPLANT
NEEDLE HYPO 25X1 1.5 SAFETY (NEEDLE) ×2 IMPLANT
NEEDLE SPNL 22GX3.5 QUINCKE BK (NEEDLE) ×4 IMPLANT
NS IRRIG 1000ML POUR BTL (IV SOLUTION) ×2 IMPLANT
PACK LAMINECTOMY NEURO (CUSTOM PROCEDURE TRAY) ×2 IMPLANT
PAD ARMBOARD 7.5X6 YLW CONV (MISCELLANEOUS) ×4 IMPLANT
PIN DISTRACTION 14MM (PIN) ×4 IMPLANT
PLATE 32MM (Plate) ×1 IMPLANT
RUBBERBAND STERILE (MISCELLANEOUS) ×4 IMPLANT
SCREW 14MM (Screw) ×6 IMPLANT
SPONGE GAUZE 4X4 12PLY (GAUZE/BANDAGES/DRESSINGS) IMPLANT
SPONGE INTESTINAL PEANUT (DISPOSABLE) ×3 IMPLANT
SPONGE SURGIFOAM ABS GEL SZ50 (HEMOSTASIS) IMPLANT
STAPLER SKIN PROX WIDE 3.9 (STAPLE) IMPLANT
STRIP CLOSURE SKIN 1/2X4 (GAUZE/BANDAGES/DRESSINGS) IMPLANT
SUT VIC AB 3-0 SH 8-18 (SUTURE) ×3 IMPLANT
SYR 20ML ECCENTRIC (SYRINGE) ×2 IMPLANT
SYR 3ML LL SCALE MARK (SYRINGE) ×2 IMPLANT
TOWEL OR 17X24 6PK STRL BLUE (TOWEL DISPOSABLE) ×2 IMPLANT
TOWEL OR 17X26 10 PK STRL BLUE (TOWEL DISPOSABLE) ×2 IMPLANT
TRAP SPECIMEN MUCOUS 40CC (MISCELLANEOUS) ×1 IMPLANT
WATER STERILE IRR 1000ML POUR (IV SOLUTION) ×2 IMPLANT

## 2012-03-08 NOTE — H&P (Signed)
[name]. Mannis #[id number] DOB: 62-Jun-1952 02/15/2012: Branch Pacitti returns today. He is still quite weak in his left hand.  I reviewed his EMG and nerve conduction velocities with him, which demonstrates a left C8 radiculopathy. He does have some mild slowing across his elbow, but it was not felt to be significant enough to be characterized as ulnar neuropathy.  I reviewed his imaging studies. I do think that the laterally based osteophyte at C7-T1 on the let is causing him his left C8 radiculopathy. He has an extruded screw at the C7 aspect from the left lowest hole at C7. He had an esophageal study which shows no disruption of the esophagus.  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 He is a large chested man and I told him it may be difficult to get to the C7-T1 level from the front; however, I would like to try to do this from the front and if we are unable to successfully do so, we might have to do surgery from the back of his neck. He understands that, but we plan on going ahead with surgery from the front. Risks and benefits were discussed. He wished to proceed. He will be fitted for a Vista collar today.  Danae Orleans. Venetia Maxon, M.D./aft cc: Dr. Enid Baas  Dr. Delrae Rend  NEUROSURGICAL CONSULTATION [name]. Beem #[id number] DOB: 1951/10/08 September 19, 2007  HISTORY: [name] is a 61 year old, disabled man who has been disabled for the last 6 to 7 years because of low back injury who presents at the request of Dr. Enid Baas for neurosurgical consultation for chief complaint of bilateral shoulder, bilateral upper extremity pain and neck pain. He currently describes his pain as ranging from 8 to 10 out of 10 in severity and notes numbness and tingling in both of his arms and hands and his fingers. He says his right arm and hand are more affected  than his left. He says this has been going on for the last 4 months and has been increasing recently. He has had an injection into his shoulder on the left about a year ago and has been taking Nabumetone 500 mg 3 x daily. He had lumbar disc surgery in 2000 which he said was related to a work-related injury. He has also had stents in both of his iliac vessels by Dr. Jacinto Halim and is on Plavix for that. He had MRI of his cervical spine which was performed through [facility] on 08/30/07 and which shows moderate central and biforaminal stenosis at C6-7 with moderate right and milder left foraminal stenosis at C5-6 and moderate central stenosis at C5-6 with mild biforaminal stenosis at C4-5 and mild left foraminal stenosis at C3-4.  REVIEW OF SYSTEMS: Review of Systems was reviewed with the patient. Pertinent positives include: Eyes - he wears glasses; Ears, Nose, Mouth and Throat - he notes inability to smell; Cardiovascular - he notes high blood pressure, high cholesterol, leg pain with walking; Musculoskeletal - he notes back pain, arm pain, leg pain, joint pain or swelling, arthritis, neck pain; Neurologic - he notes problems with his memory. Otherwise; unremarkable.  PAST MEDICAL HISTORY:   Current Medical Conditions: Significant for hypertension and bilateral lower extremity ischemia.   Height and Weight: He is 5', 7" tall and 201 lbs.  FAMILY HISTORY: Mother is deceased. Father is 25  in good health. There is a family history of diabetes and high blood pressure.  SOCIAL HISTORY: Mr. [name] is a half pack per day smoker. He has tried to quit and used Chantix, but said it was not helpful. He is a social drinker of alcoholic beverages.  [name]. Coomes #[id number] DOB: 1951/01/06 September 19, 2007  Page Two  PHYSICAL EXAMINATION:   General Appearance: Mr. Brian is a pleasant, cooperative man in no acute distress.   Blood Pressure and Pulse: His blood pressure is 120/70. Heart rate is 76 and regular.    HEENT - normocephalic, atraumatic. The pupils are equal, round and reactive to light. The extraocular muscles are intact. Sclerae - white. Conjunctiva - pink. Oropharynx benign. Uvula midline.   Neck - he has paravertebral disc, left greater than right, and has positive Lhermitte's sign with axial compression which causes tingling into his left arm. He also has positive Spurlings' maneuver to the left; negative to the right.   Respiratory - there is normal respiratory effort with good intercostal function. Lungs are clear to auscultation. There are no rales, rhonchi or wheezes.   Cardiovascular - the heart has regular rate and rhythm to auscultation. No murmurs are appreciated. There is no extremity edema, cyanosis or clubbing. There are palpable pedal pulses.   Abdomen - soft, nontender, no hepatosplenomegaly appreciated or masses. There are active bowel sounds. No guarding or rebound.   Musculoskeletal Examination - the patient is able to walk about the examining room with a normal, casual, heel and toe gait. Negative shoulder impingement testing bilaterally.  NEUROLOGICAL EXAMINATION: The patient is oriented to time, person and place and has good recall of both recent and remote memory with normal attention span and concentration. The patient speaks with clear and fluent speech and exhibits normal language function and appropriate fund of knowledge.   Cranial Nerve Examination - pupils are equal, round and reactive to light. Extraocular movements are full. Visual fields are full to confrontational testing. Facial sensation and facial movement are symmetric and intact. Hearing is intact to finger rub. Palate is upgoing. Shoulder shrug is symmetric. Tongue protrudes in the midline.  [name]. Brogden #[id number] DOB: Feb 20, 1951 September 19, 2008  Page Three   Motor Examination - In the lower extremities motor strength is 5/5 in hip flexion, extension, quadriceps, hamstrings, plantar flexion, dorsiflexion  and extensor hallucis longus. In the upper extremities motor strength is 4+/5 right biceps and triceps strength, 4+/5 left biceps and triceps strength, 4/5 bilateral hand intrinsic and finger extensor strength.   Sensory Examination - he has decreased pin sensation in the left first and right second digits.   Deep Tendon Reflexes - 2 in the biceps, triceps, 2 at the knees, 2 at the ankles, great toes are downgoing to plantar stimulation. Negative Hoffmann's signs.   Cerebellar Examination - normal coordination in upper and lower extremities and normal rapid alternating movements. Romberg test is negative.  IMPRESSION AND RECOMMENDATIONS:  Daryus Sowash is a 61 year old man with significant cervical spondylosis and stenosis most marked at C5-6 and C6-7 levels. He has milder degenerative changes at C4-5. I have advised him of the importance of stopping smoking. We also discussed surgical and nonsurgical treatment options. I have recommended that he undergo anterior cervical decompression and fusion C5 through C7 levels. He would like to do this in the near future. He is going to talk with Dr. Jacinto Halim about making sure it is okay for him to come off of the Plavix and  the plan will be to go ahead with surgery on 10/18/07. He will be off of the Plavix for a minimum of 10 days prior to surgery.  I have recommended to the patient that he undergo anterior cervical diskectomy and fusion with plating. I went over the diagnostic studies in detail and reviewed surgical models and also discussed the exact nature of the surgical procedure, attendant risks, potential benefits and typical operative and postoperative course. I discussed the risks of surgery which include, but are not limited to, risks of anesthesia, blood loss, infection, injury to various neck structures including the trachea, esophagus, which could cause temporary or permanent swallowing difficulties and also the potential for perforation of the esophagus  which might require operative intervention, larynx, recurrent laryngeal nerve, which could cause either temporary or permanent vocal cord paralysis resulting in either temporary or permanent voice changes, injury to cervical nerve roots, which could cause either temporary or permanent arm pain, numbness and/or weakness. There is a small chance of injury to the spinal cord which could cause paralysis. There is also the potential for malplacement of instrumentation, fusion failure, need for repeat surgery, degenerative disease at other levels in the neck, failure to relieve the pain, worsening of  Maxmilian Trostel. Deman #[id number] DOB: 1951/06/19 September 19, 2007  Page Four  pain. I also discussed with the patient that he will lose some neck mobility from the surgery. It is typical to stay in the hospital overnight after this operation. Typically he will not be able to drive for 2 weeks after surgery and will come back to see me 2 weeks following surgery with a lateral C-spine x-ray and then for monthly visits to 3 months after surgery. He will wear a soft collar for 2 weeks after surgery.  [facility] Danae Orleans. Venetia Maxon, M.D. JDS:sv cc: Dr. Enid Baas Dr. Delrae Rend

## 2012-03-08 NOTE — Interval H&P Note (Signed)
History and Physical Interval Note:  03/08/2012 7:44 AM  [name]  has presented today for surgery, with the diagnosis of cervical stenosis cervical herniated disc cervical radiculopathy  The various methods of treatment have been discussed with the patient and family. After consideration of risks, benefits and other options for treatment, the patient has consented to  Procedure(s) (LRB): ANTERIOR CERVICAL DECOMPRESSION/DISCECTOMY FUSION 1 LEVEL/HARDWARE REMOVAL (N/A) as a surgical intervention .  The patients' history has been reviewed, patient examined, no change in status, stable for surgery.  I have reviewed the patients' chart and labs.  Questions were answered to the patient's satisfaction.     [name] D  Date of Initial H&P: 02/15/2012  History reviewed, patient examined, no change in status, stable for surgery.

## 2012-03-08 NOTE — Progress Notes (Signed)
Patient ID: [name], male   DOB: 04/21/1951, 61 y.o.   MRN: [id number]  Late entry-for 15:55- Post-op: Alert, conversant. Good BUE strength and ROM. Incision without erythema or swelling. Aspen Collar in place. (In route to 3002 at present.)   Georgiann Cocker, RN, BSN

## 2012-03-08 NOTE — Anesthesia Preprocedure Evaluation (Addendum)
Anesthesia Evaluation  Patient identified by MRN, date of birth, ID band Patient awake    Reviewed: Allergy & Precautions, H&P , NPO status , Patient's Chart, lab work & pertinent test results  History of Anesthesia Complications Negative for: history of anesthetic complications  Airway Mallampati: II TM Distance: >3 FB Neck ROM: Full    Dental  (+) Edentulous Upper, Poor Dentition and Dental Advisory Given   Pulmonary neg pulmonary ROS, Current Smoker,  breath sounds clear to auscultation        Cardiovascular hypertension, Pt. on medications + Peripheral Vascular Disease Rhythm:Regular Rate:Normal     Neuro/Psych  Neuromuscular disease (cervical spondylosis) negative psych ROS   GI/Hepatic Neg liver ROS, GERD-  Medicated,Dysphagia    Endo/Other  Diabetes mellitus-, Type obesity  Renal/GU negative Renal ROS  negative genitourinary   Musculoskeletal  (+) Arthritis -, Osteoarthritis,    Abdominal (+) + obese,   Peds negative pediatric ROS (+)  Hematology  (+) Blood dyscrasia, anemia ,   Anesthesia Other Findings   Reproductive/Obstetrics                        Anesthesia Physical Anesthesia Plan  ASA: III  Anesthesia Plan: General   Post-op Pain Management:    Induction: Intravenous  Airway Management Planned: Oral ETT  Additional Equipment:   Intra-op Plan:   Post-operative Plan: Extubation in OR  Informed Consent: I have reviewed the patients History and Physical, chart, labs and discussed the procedure including the risks, benefits and alternatives for the proposed anesthesia with the patient or authorized representative who has indicated his/her understanding and acceptance.   Dental advisory given  Plan Discussed with: CRNA  Anesthesia Plan Comments:       Anesthesia Quick Evaluation

## 2012-03-08 NOTE — Anesthesia Postprocedure Evaluation (Signed)
  Anesthesia Post-op Note  Patient: [name]  Procedure(s) Performed: Procedure(s) (LRB): ANTERIOR CERVICAL DECOMPRESSION/DISCECTOMY FUSION 1 LEVEL/HARDWARE REMOVAL (N/A)  Patient Location: PACU  Anesthesia Type: General  Level of Consciousness: awake  Airway and Oxygen Therapy: Patient Spontanous Breathing  Post-op Pain: mild  Post-op Assessment: Post-op Vital signs reviewed  Post-op Vital Signs: stable  Complications: No apparent anesthesia complications

## 2012-03-08 NOTE — Anesthesia Procedure Notes (Signed)
Procedure Name: Intubation Date/Time: 03/08/2012 10:12 AM Performed by: Leona Singleton A Pre-anesthesia Checklist: Patient identified Patient Re-evaluated:Patient Re-evaluated prior to inductionOxygen Delivery Method: Circle system utilized Preoxygenation: Pre-oxygenation with 100% oxygen Intubation Type: IV induction and Cricoid Pressure applied Ventilation: Mask ventilation without difficulty and Oral airway inserted - appropriate to patient size Laryngoscope Size: Hyacinth Meeker and 2 Grade View: Grade I Tube type: Oral Tube size: 7.5 mm Number of attempts: 1 Airway Equipment and Method: Stylet and LTA kit utilized Placement Confirmation: ETT inserted through vocal cords under direct vision,  positive ETCO2 and breath sounds checked- equal and bilateral Secured at: 21 cm Tube secured with: Tape Dental Injury: Injury to lip  Comments: CSpine neutral throughout DL. Small abrasion noted to lower lip after oral airway inserted during initial masking of pt.

## 2012-03-08 NOTE — Op Note (Signed)
03/08/2012  12:34 PM  PATIENT:  [name]  61 y.o. 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  PROCEDURE:  Procedure(s) (LRB): Exploration of fusion C6/7, ANTERIOR CERVICAL DECOMPRESSION/DISCECTOMY FUSION C7/T1 and revision ACDF C6/7, Bone allograft and local autograft, Anterior cervical plating C6-T1 (N/A)  SURGEON:  Surgeon(s) and Role:    * Maeola Harman, MD - Primary    * Clydene Fake, MD - Assisting  PHYSICIAN ASSISTANT:   ASSISTANTS: Poteat, RN   ANESTHESIA:   general  EBL:  Total I/O In: 2000 [I.V.:2000] Out: 50 [Blood:50]  BLOOD ADMINISTERED:none  DRAINS: none   LOCAL MEDICATIONS USED:  LIDOCAINE   SPECIMEN:  No Specimen  DISPOSITION OF SPECIMEN:  N/A  COUNTS:  YES  TOURNIQUET:  * No tourniquets in log *  DICTATION:   INDICATIONS: Patient has left arm weakness with C8 radiculopathy with C7T1 HNP, prior ACDF C56 and C67 with one extruded screw at C7 and the other screw at C7 fractured, with pseudoarthrosis at C6/7.  It was elected to remove previously palced plate, then perform ACDF at C7/T1 with exploration of C6/7 pseudoarthrosis and revision at this level. The patient is a 300lb smoker with diabetes.  PROCEDURE: Patient was brought to operating room and following the smooth and uncomplicated induction of general endotracheal anesthesia her head was placed on a horseshoe head holder she was placed in 10 pounds of Holter traction and her anterior neck was prepped and draped in usual sterile fashion. An incision was made on the left side of midline after infiltrating the skin and subcutaneous tissues with local lidocaine through the prior incision. The platysmal layer was incised and subplatysmal dissection was performed exposing the anterior border  sternocleidomastoid muscle. Using blunt dissection the carotid sheath was kept lateral and trachea and esophagus kept medial exposing the anterior cervical spine.  The extruded screw was identified in the prevertebral soft tissues and removed without difficulty. The previously placed plate was removed along with remaining screws except for bottom half of right C7 screw, which was fractured and embedded in the vertebra. The C7T1 level was exposed and overlying osteophytes were removed. Self-retaining retractors were placed. Distraction pins were placed at C7 and T1. Initially this level was operated. Uncinate spurs and central spondylitic ridges were drilled down with a high-speed drill. The spinal cord dura and both C8 nerve roots were widely decompressed. Hemostasis was assured. After trial sizing an 8 mm allograft bone wedge was selected and tamped into position and countersunk appropriately. Additional local autograft was packed into the interspace. Attention was the paid to the C6/7 level, where the pseudoarthrosis was defined.  The inferior edge had fused to the superior aspect of C7, but the C6/7 interface had not fused. The pseudoarthrosis was drilled out to the edges of viable bone and a 5mm allograft wedge supplemented with local autograft was packed in the interspace. Distraction weight was removed. A 32 mm trestle luxe anterior cervical plate was affixed to the cervical spine with 14mm variable-angle screws 2 at C6,2 at C7and 2 at T1.   Srews were well-positioned and locking mechanisms were engaged. A final X ray was obtained which did not demonstrate the costruct, given patient's large size. Soft tissues were inspected and found to be in good repair. The wound was irrigated. The platysma layer was closed with 3-0 Vicryl stitches and the skin was  reapproximated with 3-0 Vicryl subcuticular stitches. The wound was dressed with Dermabond. Counts were correct at the end of the case.    PLAN OF CARE: Admit  for overnight observation  PATIENT DISPOSITION:  PACU - hemodynamically stable.   Delay start of Pharmacological VTE agent (>24hrs) due to surgical blood loss or risk of bleeding: yes

## 2012-03-08 NOTE — Preoperative (Signed)
Beta Blockers   Reason not to administer Beta Blockers:Not Applicable 

## 2012-03-08 NOTE — Transfer of Care (Signed)
Immediate Anesthesia Transfer of Care Note  Patient: [name]  Procedure(s) Performed: Procedure(s) (LRB): ANTERIOR CERVICAL DECOMPRESSION/DISCECTOMY FUSION 1 LEVEL/HARDWARE REMOVAL (N/A)  Patient Location: PACU  Anesthesia Type: General  Level of Consciousness: awake, alert , oriented and patient cooperative  Airway & Oxygen Therapy: Patient Spontanous Breathing and Patient connected to nasal cannula oxygen  Post-op Assessment: Report given to PACU RN, Post -op Vital signs reviewed and stable and Patient moving all extremities X 4  Post vital signs: Reviewed and stable  Complications: No apparent anesthesia complications

## 2012-03-09 LAB — GLUCOSE, CAPILLARY
Glucose-Capillary: 155 mg/dL — ABNORMAL HIGH (ref 70–99)
Glucose-Capillary: 180 mg/dL — ABNORMAL HIGH (ref 70–99)

## 2012-03-09 NOTE — Progress Notes (Signed)
Subjective: Patient reports "I'm doing good!"  Objective: Vital signs in last 24 hours: Temp:  [97.2 F (36.2 C)-97.9 F (36.6 C)] 97.8 F (36.6 C) (03/15 0526) Pulse Rate:  [61-80] 61  (03/15 0526) Resp:  [16-33] 18  (03/15 0526) BP: (106-153)/(59-81) 129/77 mmHg (03/15 0526) SpO2:  [93 %-98 %] 97 % (03/15 0526) FiO2 (%):  [2 %] 2 % (03/14 1840) Weight:  [98.8 kg (217 lb 13 oz)] 98.8 kg (217 lb 13 oz) (03/14 2208)  Intake/Output from previous day: 03/14 0701 - 03/15 0700 In: 3100 [I.V.:2900; IV Piggyback:200] Out: 1500 [Urine:1450; Blood:50] Intake/Output this shift:    Alert conversant, sitting on EOB preparing for breakfast. Good strength BUE. Incision with Dermabond; no swelling or erythema. Soreness across shoulders - reassured.  Lab Results: No results found for this basename: WBC:2,HGB:2,HCT:2,PLT:2 in the last 72 hours BMET No results found for this basename: NA:2,K:2,CL:2,CO2:2,GLUCOSE:2,BUN:2,CREATININE:2,CALCIUM:2 in the last 72 hours  Studies/Results: Dg Cervical Spine 1 View  03/08/2012  *RADIOLOGY REPORT*  Clinical Data: Previous ACDF, hardware removal  DG CERVICAL SPINE - 1 VIEW  Comparison: 01/24/2012  Findings: Cross-table lateral intraoperative view demonstrates the upper cervical spine from C1 the C4.  C5-6 and C6-7 obscured by overlying soft tissues (shoulders).  IMPRESSION: Limited intraoperative imaging of the cervical spine.  No radiopaque localizer evident.  Original Report Authenticated By: Judie Petit. Ruel Favors, M.D.    Assessment/Plan: Improving.  LOS: 1 day  Mobilize this am. Will discuss d/c at visit this afternoon.   Georgiann Cocker 03/09/2012, 8:30 AM  Mobilize in hospital.  Improving nicely following ACDF.

## 2012-03-09 NOTE — Progress Notes (Signed)
UR COMPLETED  

## 2012-03-09 NOTE — Evaluation (Signed)
Occupational Therapy Evaluation Patient Details Name: [name] MRN: [id number] DOB: [birth year] Today's Date: 03/09/2012  Problem List:  Patient Active Problem List  Diagnoses  . DIABETES MELLITUS, TYPE II  . HYPERCHOLESTEROLEMIA  . OBESITY, NOS  . TOBACCO DEPENDENCE  . HYPERTENSION, BENIGN SYSTEMIC  . PERIPHERAL VASCULAR DISEASE, UNSPEC.  Marland Kitchen GERD  . OSTEOARTHRITIS, MULTI SITES  . Toe fracture, right  . Neck pain  . URI (upper respiratory infection)  . Dysphagia    Past Medical History:  Past Medical History  Diagnosis Date  . Diabetes mellitus   . GERD (gastroesophageal reflux disease)   . Hyperlipidemia   . Hypertension   . Arthritis     OA  . Cervical radiculopathy     Dr. Venetia Maxon neurosurgery  . PAD (peripheral artery disease)   . Tobacco user   . Chronic lower back pain    Past Surgical History:  Past Surgical History  Procedure Date  . Anterior cervical decomp/discectomy fusion 03/08/12    C6-7  . Vascular surgery ~ 2007    Stent SFA   . Back surgery 1996  . Lumbar disc surgery 1996    "lower"  . Inguinal hernia repair 1990's    right    OT Assessment/Plan/Recommendation OT Assessment Clinical Impression Statement: 61 yo male s/p ACDF that does not require acute OT at this time. OT to sign off. No DME needs OT Recommendation/Assessment: Patient does not need any further OT services OT Recommendation Follow Up Recommendations: No OT follow up Equipment Recommended: None recommended by OT OT Goals    OT Evaluation Precautions/Restrictions  Precautions Precautions: Other (comment) (cervical) Required Braces or Orthoses: Yes Cervical Brace: Hard collar;Applied in supine position Restrictions Weight Bearing Restrictions: No Prior Functioning Home Living Lives With: Spouse Receives Help From: Family Type of Home: Apartment Home Layout: One level Home Access: Level entry Bathroom Shower/Tub: Tub/shower unit;Curtain Firefighter:  Standard Bathroom Accessibility: Yes How Accessible: Accessible via walker Home Adaptive Equipment: Straight cane Prior Function Level of Independence: Independent with homemaking with ambulation;Independent with basic ADLs;Independent with gait;Independent with transfers Able to Take Stairs?: Yes Driving: Yes Vocation: On disability Leisure: Hobbies-yes (Comment) Comments: fishing ADL ADL Eating/Feeding: Performed;Modified independent Where Assessed - Eating/Feeding: Edge of bed Grooming: Wash/dry hands;Performed;Modified independent Where Assessed - Grooming: Standing at sink Lower Body Dressing: Simulated;Modified independent Where Assessed - Lower Body Dressing: Sitting, chair;Unsupported (able to cross BIL LE) Toilet Transfer: Buyer, retail Method: Proofreader: Regular height toilet ADL Comments: Pt educated on cervical precautions of reaching no higher than 90 degrees, changing pads in brace, positioning of brace, and ADLs with cervical precautions. Pt return demonstrated and verbalized back to therapist.  Vision/Perception  Vision - History Baseline Vision: No visual deficits Cognition Cognition Arousal/Alertness: Awake/alert Overall Cognitive Status: Appears within functional limits for tasks assessed Orientation Level: Oriented X4 Sensation/Coordination Sensation Light Touch: Appears Intact Coordination Gross Motor Movements are Fluid and Coordinated: Yes Fine Motor Movements are Fluid and Coordinated: Yes Extremity Assessment RUE Assessment RUE Assessment: Within Functional Limits (grossly) LUE Assessment LUE Assessment: Within Functional Limits (grossly) Mobility  Bed Mobility Bed Mobility: Yes Supine to Sit: 6: Modified independent (Device/Increase time);With rails Sitting - Scoot to Edge of Bed: 6: Modified independent (Device/Increase time) Transfers Transfers: Yes Sit to Stand: 6: Modified  independent (Device/Increase time);With upper extremity assist;From bed Stand to Sit: 6: Modified independent (Device/Increase time);With upper extremity assist;To bed Exercises   End of Session OT - End of Session Equipment  Utilized During Treatment: Cervical collar Activity Tolerance: Patient tolerated treatment well Patient left: in chair;with call bell in reach Nurse Communication: Mobility status for transfers General Behavior During Session: [facility] for tasks performed Cognition: [facility] for tasks performed   Lucile Shutters 03/09/2012, 3:00 PM  Pager: [phone]

## 2012-03-09 NOTE — Progress Notes (Signed)
Physical Therapy Evaluation Patient Details Name: [name] MRN: [id number] DOB: 03-Jun-1951 Today's Date: 03/09/2012  Problem List:  Patient Active Problem List  Diagnoses  . DIABETES MELLITUS, TYPE II  . HYPERCHOLESTEROLEMIA  . OBESITY, NOS  . TOBACCO DEPENDENCE  . HYPERTENSION, BENIGN SYSTEMIC  . PERIPHERAL VASCULAR DISEASE, UNSPEC.  Marland Kitchen GERD  . OSTEOARTHRITIS, MULTI SITES  . Toe fracture, right  . Neck pain  . URI (upper respiratory infection)  . Dysphagia    Past Medical History:  Past Medical History  Diagnosis Date  . Diabetes mellitus   . GERD (gastroesophageal reflux disease)   . Hyperlipidemia   . Hypertension   . Arthritis     OA  . Cervical radiculopathy     Dr. Venetia Maxon neurosurgery  . PAD (peripheral artery disease)   . Tobacco user   . Chronic lower back pain    Past Surgical History:  Past Surgical History  Procedure Date  . Anterior cervical decomp/discectomy fusion 03/08/12    C6-7  . Vascular surgery ~ 2007    Stent SFA   . Back surgery 1996  . Lumbar disc surgery 1996    "lower"  . Inguinal hernia repair 1990's    right    PT Assessment/Plan/Recommendation PT Assessment Clinical Impression Statement: Pt presents with a medical diagnosis of ACDF C7-T1. Pt is at his baseline functional level and does not require any skilled PT. Please reorder if necessary PT Recommendation/Assessment: Patent does not need any further PT services No Skilled PT: All education completed;Patient at baseline level of functioning;Patient is modified independent with all activity/mobility PT Recommendation Follow Up Recommendations: No PT follow up Equipment Recommended: None recommended by PT PT Goals     PT Evaluation Precautions/Restrictions  Precautions Precautions: Other (comment) (cervical) Required Braces or Orthoses: Yes Cervical Brace: Hard collar;Applied in supine position Restrictions Weight Bearing Restrictions: No Prior Functioning  Home  Living Lives With: Alone Receives Help From: Family (for the first week) Type of Home: Apartment Home Layout: One level Home Access: Level entry Bathroom Shower/Tub: Tub/shower unit;Curtain Firefighter: Standard Bathroom Accessibility: Yes How Accessible: Accessible via walker Home Adaptive Equipment: Straight cane Prior Function Level of Independence: Independent with homemaking with ambulation;Independent with basic ADLs;Independent with gait;Independent with transfers Able to Take Stairs?: Yes Driving: Yes Vocation: On disability Leisure: Hobbies-yes (Comment) Comments: fishing Cognition Cognition Arousal/Alertness: Awake/alert Overall Cognitive Status: Appears within functional limits for tasks assessed Orientation Level: Oriented X4 Sensation/Coordination Sensation Light Touch: Appears Intact Coordination Gross Motor Movements are Fluid and Coordinated: Yes Extremity Assessment RLE Assessment RLE Assessment: Within Functional Limits LLE Assessment LLE Assessment: Within Functional Limits Mobility (including Balance) Bed Mobility Bed Mobility: Yes Supine to Sit: 6: Modified independent (Device/Increase time) Sitting - Scoot to Edge of Bed: 6: Modified independent (Device/Increase time) Transfers Transfers: Yes Sit to Stand: 6: Modified independent (Device/Increase time);With upper extremity assist;From bed Stand to Sit: 6: Modified independent (Device/Increase time);With upper extremity assist;To bed Ambulation/Gait Ambulation/Gait: Yes Ambulation/Gait Assistance: 5: Supervision Ambulation/Gait Assistance Details (indicate cue type and reason): Supervision for safety secondary to pt with slight unsteadiness initially, although resided with increased ambulation Ambulation Distance (Feet): 150 Feet Assistive device: None Gait Pattern: Within Functional Limits Gait velocity: Decreased gait speed Stairs: No    Exercise    End of Session PT - End of  Session Equipment Utilized During Treatment: Gait belt;Cervical collar Activity Tolerance: Patient tolerated treatment well Patient left: in chair;with call bell in reach Nurse Communication: Mobility status for transfers;Mobility  status for ambulation General Behavior During Session: [facility] for tasks performed Cognition: [facility] for tasks performed  Milana Kidney 03/09/2012, 10:07 AM  03/09/2012 Milana Kidney DPT PAGER: [phone] OFFICE: [phone]

## 2012-03-10 ENCOUNTER — Other Ambulatory Visit: Payer: Self-pay | Admitting: Family Medicine

## 2012-03-10 LAB — GLUCOSE, CAPILLARY: Glucose-Capillary: 168 mg/dL — ABNORMAL HIGH (ref 70–99)

## 2012-03-10 NOTE — Discharge Summary (Signed)
Physician Discharge Summary  Patient ID: [name] MRN: [id number] DOB/AGE: 62-Jan-1952 62 y.o.  Admit date: 03/08/2012 Discharge date: 03/10/2012  Admission Diagnoses:[id number] [id number] plate Discharge Diagnoses: same   Discharged Condition: stable. Able to swallow. Wants to go home today  Hospital Course: surgery 03/08/12  Consults: none  Significant Diagnostic Studies: mri  Treatments: surgery  Discharge Exam: Blood pressure 132/79, pulse 71, temperature 98 F (36.7 C), temperature source Oral, resp. rate 20, height 5\' 7"  (1.702 m), weight 98.8 kg (217 lb 13 oz), SpO2 93.00%. Pain between shoulders. Able to swallow. Some cough.  Trachea in midline Disposition: wants to home.  Any question to call us.    Medication List  As of 03/10/2012  8:44 AM   ASK your doctor about these medications         amLODipine-benazepril 10-20 MG per capsule   Commonly known as: LOTREL   Take 1 capsule by mouth every morning.      aspirin 325 MG tablet   Take 325 mg by mouth every morning.      buPROPion 150 MG 24 hr tablet   Commonly known as: WELLBUTRIN XL   Take 150 mg by mouth every morning.      BYSTOLIC 20 MG Tabs   Generic drug: Nebivolol HCl      clopidogrel 75 MG tablet   Commonly known as: PLAVIX   Take 75 mg by mouth every morning.      ezetimibe-simvastatin 10-40 MG per tablet   Commonly known as: VYTORIN   Take 1 tablet by mouth every morning.      VYTORIN 10-40 MG per tablet   Generic drug: ezetimibe-simvastatin      gabapentin 600 MG tablet   Commonly known as: NEURONTIN   Take 800-1,200 mg by mouth 3 (three) times daily. 800 mg in am and afternoon; 1200 mg at night      metFORMIN 500 MG tablet   Commonly known as: GLUCOPHAGE   Take 500 mg by mouth 2 (two) times daily with a meal.      oxyCODONE-acetaminophen 5-325 MG per tablet   Commonly known as: PERCOCET   Take 1 tablet by mouth every 8 (eight) hours as needed. For pain      traMADol 50 MG tablet    Commonly known as: ULTRAM   Take 50 mg by mouth 2 (two) times daily as needed. FOR PAIN             Signed: Karn Cassis 03/10/2012, 8:44 AM

## 2012-03-10 NOTE — Progress Notes (Signed)
Patient ID: [name], male   DOB: November 12, 1951, 61 y.o.   MRN: [id number] Patient wants to go home. He will call dr Venetia Maxon to ask about when to restart the plavix

## 2012-03-11 NOTE — Telephone Encounter (Signed)
Refill request

## 2012-03-13 ENCOUNTER — Encounter (HOSPITAL_COMMUNITY): Payer: Self-pay | Admitting: Neurosurgery

## 2012-03-13 LAB — GLUCOSE, CAPILLARY
Glucose-Capillary: 176 mg/dL — ABNORMAL HIGH (ref 70–99)
Glucose-Capillary: 114 mg/dL — ABNORMAL HIGH (ref 70–99)
Glucose-Capillary: 107 mg/dL — ABNORMAL HIGH (ref 70–99)

## 2012-03-13 NOTE — Telephone Encounter (Signed)
Refill request-Dr. Newton on vacation 

## 2012-04-05 ENCOUNTER — Other Ambulatory Visit: Payer: Self-pay | Admitting: Family Medicine

## 2012-04-24 ENCOUNTER — Ambulatory Visit: Payer: Medicare Other | Admitting: Family Medicine

## 2012-05-01 ENCOUNTER — Ambulatory Visit: Payer: Medicare Other | Admitting: Family Medicine

## 2012-05-02 DIAGNOSIS — R0989 Other specified symptoms and signs involving the circulatory and respiratory systems: Secondary | ICD-10-CM | POA: Diagnosis not present

## 2012-05-02 DIAGNOSIS — R0609 Other forms of dyspnea: Secondary | ICD-10-CM | POA: Diagnosis not present

## 2012-05-02 DIAGNOSIS — I10 Essential (primary) hypertension: Secondary | ICD-10-CM | POA: Diagnosis not present

## 2012-05-02 DIAGNOSIS — E119 Type 2 diabetes mellitus without complications: Secondary | ICD-10-CM | POA: Diagnosis not present

## 2012-05-02 DIAGNOSIS — I70219 Atherosclerosis of native arteries of extremities with intermittent claudication, unspecified extremity: Secondary | ICD-10-CM | POA: Diagnosis not present

## 2012-05-10 ENCOUNTER — Ambulatory Visit (INDEPENDENT_AMBULATORY_CARE_PROVIDER_SITE_OTHER): Payer: Medicare Other | Admitting: Family Medicine

## 2012-05-10 ENCOUNTER — Encounter: Payer: Self-pay | Admitting: Family Medicine

## 2012-05-10 ENCOUNTER — Telehealth: Payer: Self-pay | Admitting: *Deleted

## 2012-05-10 VITALS — BP 112/67 | HR 93 | Temp 97.6°F | Ht 67.0 in | Wt 217.0 lb

## 2012-05-10 DIAGNOSIS — S39012A Strain of muscle, fascia and tendon of lower back, initial encounter: Secondary | ICD-10-CM | POA: Insufficient documentation

## 2012-05-10 DIAGNOSIS — M545 Low back pain, unspecified: Secondary | ICD-10-CM

## 2012-05-10 DIAGNOSIS — S335XXA Sprain of ligaments of lumbar spine, initial encounter: Secondary | ICD-10-CM | POA: Diagnosis not present

## 2012-05-10 MED ORDER — TRAMADOL HCL 50 MG PO TABS: 50.0000 mg | ORAL_TABLET | Freq: Two times a day (BID) | ORAL | Status: DC | PRN

## 2012-05-10 MED ORDER — GABAPENTIN 600 MG PO TABS: 600.0000 mg | ORAL_TABLET | Freq: Three times a day (TID) | ORAL | Status: DC

## 2012-05-10 NOTE — Patient Instructions (Signed)

## 2012-05-10 NOTE — Assessment & Plan Note (Signed)
Lumbar strain. Given traumatic etiology, will obtain xrays to r/o vertebral pathology. Tylenol, tramadol.  Handout given. Next step would be PT referral.

## 2012-05-10 NOTE — Progress Notes (Signed)
Subjective:  Pt here for acute visit.  Had neck surgery 03/08/12. Had repair of C6-C7 vertebrae with [facility].  Pt was placed in restrictive neck brace for period of time.  Pt states that he fell twice 2/2 neck pain and reaggravated back.  Has had low back pain since this point.  Some mild radicular sxs.  No paresthesias.  No bowel/bladder incontinence.  Pain worse with prolonged standing and general ambulation.  Has been having to use cane to ambulate.        Review of Systems - Negative except as noted above in HPI  Objective:  Current Outpatient Prescriptions  Medication Sig Dispense Refill  . amLODipine-benazepril (LOTREL) 10-20 MG per capsule Take 1 capsule by mouth every morning.      Marland Kitchen aspirin 325 MG tablet Take 325 mg by mouth every morning.       Marland Kitchen buPROPion (WELLBUTRIN XL) 150 MG 24 hr tablet Take 150 mg by mouth every morning.      Marland Kitchen BYSTOLIC 20 MG TABS TAKE 1 TABLET BY MOUTH DAILY FOR HIGH BLOOD PRESSURE  30 tablet  3  . ezetimibe-simvastatin (VYTORIN) 10-40 MG per tablet Take 1 tablet by mouth every morning.      . ezetimibe-simvastatin (VYTORIN) 10-40 MG per tablet       . gabapentin (NEURONTIN) 600 MG tablet Take 800-1,200 mg by mouth 3 (three) times daily. 800 mg in am and afternoon; 1200 mg at night      . glipiZIDE (GLUCOTROL) 5 MG tablet TAKE 1 TABLET BY MOUTH DAILY  30 tablet  3  . metFORMIN (GLUCOPHAGE) 500 MG tablet Take 500 mg by mouth 2 (two) times daily with a meal.      . Nebivolol HCl (BYSTOLIC) 20 MG TABS       . pantoprazole (PROTONIX) 40 MG tablet TAKE 1 TABLET BY MOUTH EVERY DAY  30 tablet  11  . traMADol (ULTRAM) 50 MG tablet Take 50 mg by mouth 2 (two) times daily as needed. FOR PAIN        Wt Readings from Last 3 Encounters:  05/10/12 217 lb (98.431 kg)  03/08/12 217 lb 13 oz (98.8 kg)  03/08/12 217 lb 13 oz (98.8 kg)   Temp Readings from Last 3 Encounters:  05/10/12 97.6 F (36.4 C) Oral  03/10/12 98.4 F (36.9 C) Oral    03/10/12 98.4 F (36.9 C) Oral   BP Readings from Last 3 Encounters:  05/10/12 112/67  03/10/12 123/79  03/10/12 123/79   Pulse Readings from Last 3 Encounters:  05/10/12 93  03/10/12 68  03/10/12 68   Gen: up in chair, NAD HEENT: NCAT, EOMI, TMs clear bilaterally CV: RRR, no murmurs auscultated PULM: CTAB, no wheezes, rales, rhoncii ABD: S/NT/+ bowel sounds  EXT: 2+ peripheral pulses MSK: + TTP in lumbar region, FABER negative, + pain with flexion and extension.    Assessment/Plan:

## 2012-05-10 NOTE — Telephone Encounter (Signed)
Pharmacy calls needing clarification on gabapentin RX. Has two directions.. Dr. Alvester Morin notified and he will correct and send in new RX.

## 2012-05-11 ENCOUNTER — Ambulatory Visit
Admission: RE | Admit: 2012-05-11 | Discharge: 2012-05-11 | Disposition: A | Discharge status: Home / Self Care | Payer: Medicare Other | Source: Ambulatory Visit | Attending: Family Medicine | Admitting: Family Medicine

## 2012-05-11 DIAGNOSIS — M545 Low back pain, unspecified: Secondary | ICD-10-CM

## 2012-05-11 DIAGNOSIS — M47817 Spondylosis without myelopathy or radiculopathy, lumbosacral region: Secondary | ICD-10-CM | POA: Diagnosis not present

## 2012-05-11 DIAGNOSIS — M5137 Other intervertebral disc degeneration, lumbosacral region: Secondary | ICD-10-CM | POA: Diagnosis not present

## 2012-05-11 NOTE — Telephone Encounter (Signed)
Called pharmacy  and was told that the most recent RX they have is Gabapentin  600 mg three times a day.

## 2012-05-11 NOTE — Telephone Encounter (Signed)
Called to follow up on rx. Medication has been filled at 600mg  TID.

## 2012-05-17 ENCOUNTER — Telehealth: Payer: Self-pay | Admitting: Family Medicine

## 2012-05-17 NOTE — Telephone Encounter (Signed)
Patient is calling because he thought that Dr. Alvester Morin was going to prescribe a spray for his foot and he hasn't gotten it yet.  He uses [facility] on [address].

## 2012-05-22 ENCOUNTER — Other Ambulatory Visit: Payer: Self-pay | Admitting: Family Medicine

## 2012-05-22 MED ORDER — MICONAZOLE NITRATE 2 % EX POWD: CUTANEOUS | Status: AC | PRN

## 2012-05-22 NOTE — Telephone Encounter (Signed)
Lotrimin called in. Please inform pt. Thank you.

## 2012-05-23 ENCOUNTER — Encounter: Payer: Self-pay | Admitting: Family Medicine

## 2012-05-23 ENCOUNTER — Ambulatory Visit (INDEPENDENT_AMBULATORY_CARE_PROVIDER_SITE_OTHER): Payer: Medicare Other | Admitting: Family Medicine

## 2012-05-23 VITALS — BP 124/75 | HR 79 | Ht 67.0 in | Wt 212.0 lb

## 2012-05-23 DIAGNOSIS — M549 Dorsalgia, unspecified: Secondary | ICD-10-CM | POA: Diagnosis not present

## 2012-05-23 DIAGNOSIS — E119 Type 2 diabetes mellitus without complications: Secondary | ICD-10-CM | POA: Diagnosis not present

## 2012-05-23 DIAGNOSIS — S39012A Strain of muscle, fascia and tendon of lower back, initial encounter: Secondary | ICD-10-CM

## 2012-05-23 MED ORDER — GABAPENTIN 800 MG PO TABS: 800.0000 mg | ORAL_TABLET | Freq: Three times a day (TID) | ORAL | Status: DC

## 2012-05-23 MED ORDER — TRAMADOL HCL 50 MG PO TABS: 50.0000 mg | ORAL_TABLET | Freq: Two times a day (BID) | ORAL | Status: DC | PRN

## 2012-05-23 NOTE — Progress Notes (Signed)
  Subjective:    Patient ID: [name], male    DOB: 1951/12/11, 61 y.o.   MRN: [id number]  HPI Patient will follow up a low back pain. Has been on Neurontin and tramadol for this. Lumbar x-rays obtained at most recent visit Showing degenerative changes in lumbar region but no acute bony abnormalities. Neurontin was recently increased at last clinic visit. Pain has been mildly improved with this regimen. Is still having some pain with ambulation. Still having some radicular symptoms most predominantly in R leg.     Review of Systems See HPI, otherwise ROS negative.     Objective:   Physical Exam Gen: up in chair, NAD HEENT: NCAT, EOMI CV: RRR, no murmurs auscultated PULM: CTAB, no wheezes, rales, rhoncii ABD: S/NT/+ bowel sounds  EXT: 2+ peripheral pulses MSK: + TTP in lumbar region.          Assessment & Plan:

## 2012-05-23 NOTE — Patient Instructions (Signed)
It was good to see today I'm referring you to physical therapy for your back I have increased her Neurontin to 800 mg 3 times a day He can take Tylenol up to 1000 mg with your tramadol 3 times a day Give me a call if your symptoms dont improve in the next 2-3 weeks Call if any questions

## 2012-05-23 NOTE — Assessment & Plan Note (Signed)
Will increase neurontin to 800mg  TID. Tramadol and tylenol as needed for pain. Formal referral to PT. May consider addition/transition to narcotics if this regimen is not helpful.

## 2012-05-24 LAB — MICROALBUMIN / CREATININE URINE RATIO
Creatinine, Urine: 230 mg/dL
Microalb Creat Ratio: 3 mg/g (ref 0.0–30.0)
Microalb, Ur: 0.7 mg/dL (ref 0.00–1.89)

## 2012-05-25 DIAGNOSIS — E119 Type 2 diabetes mellitus without complications: Secondary | ICD-10-CM | POA: Diagnosis not present

## 2012-05-25 DIAGNOSIS — I10 Essential (primary) hypertension: Secondary | ICD-10-CM | POA: Diagnosis not present

## 2012-05-25 DIAGNOSIS — R0602 Shortness of breath: Secondary | ICD-10-CM | POA: Diagnosis not present

## 2012-05-29 ENCOUNTER — Other Ambulatory Visit: Payer: Self-pay | Admitting: Family Medicine

## 2012-06-04 DIAGNOSIS — I10 Essential (primary) hypertension: Secondary | ICD-10-CM | POA: Diagnosis not present

## 2012-06-04 DIAGNOSIS — E119 Type 2 diabetes mellitus without complications: Secondary | ICD-10-CM | POA: Diagnosis not present

## 2012-06-04 DIAGNOSIS — R0602 Shortness of breath: Secondary | ICD-10-CM | POA: Diagnosis not present

## 2012-06-04 DIAGNOSIS — I70219 Atherosclerosis of native arteries of extremities with intermittent claudication, unspecified extremity: Secondary | ICD-10-CM | POA: Diagnosis not present

## 2012-06-11 ENCOUNTER — Ambulatory Visit: Payer: Medicare Other | Attending: Family Medicine

## 2012-06-11 DIAGNOSIS — IMO0001 Reserved for inherently not codable concepts without codable children: Secondary | ICD-10-CM | POA: Insufficient documentation

## 2012-06-11 DIAGNOSIS — M545 Low back pain, unspecified: Secondary | ICD-10-CM | POA: Insufficient documentation

## 2012-06-11 DIAGNOSIS — R269 Unspecified abnormalities of gait and mobility: Secondary | ICD-10-CM | POA: Diagnosis not present

## 2012-06-11 DIAGNOSIS — M25569 Pain in unspecified knee: Secondary | ICD-10-CM | POA: Insufficient documentation

## 2012-06-13 ENCOUNTER — Ambulatory Visit: Payer: Medicare Other

## 2012-06-13 DIAGNOSIS — IMO0001 Reserved for inherently not codable concepts without codable children: Secondary | ICD-10-CM | POA: Diagnosis not present

## 2012-06-13 DIAGNOSIS — M545 Low back pain, unspecified: Secondary | ICD-10-CM | POA: Diagnosis not present

## 2012-06-13 DIAGNOSIS — R269 Unspecified abnormalities of gait and mobility: Secondary | ICD-10-CM | POA: Diagnosis not present

## 2012-06-13 DIAGNOSIS — M25569 Pain in unspecified knee: Secondary | ICD-10-CM | POA: Diagnosis not present

## 2012-06-19 ENCOUNTER — Ambulatory Visit: Payer: Medicare Other

## 2012-06-19 DIAGNOSIS — IMO0001 Reserved for inherently not codable concepts without codable children: Secondary | ICD-10-CM | POA: Diagnosis not present

## 2012-06-19 DIAGNOSIS — R269 Unspecified abnormalities of gait and mobility: Secondary | ICD-10-CM | POA: Diagnosis not present

## 2012-06-19 DIAGNOSIS — M25569 Pain in unspecified knee: Secondary | ICD-10-CM | POA: Diagnosis not present

## 2012-06-19 DIAGNOSIS — M545 Low back pain, unspecified: Secondary | ICD-10-CM | POA: Diagnosis not present

## 2012-06-27 ENCOUNTER — Other Ambulatory Visit: Payer: Self-pay | Admitting: Family Medicine

## 2012-06-27 DIAGNOSIS — I10 Essential (primary) hypertension: Secondary | ICD-10-CM

## 2012-07-03 ENCOUNTER — Other Ambulatory Visit: Payer: Self-pay | Admitting: Family Medicine

## 2012-07-03 ENCOUNTER — Ambulatory Visit: Payer: Medicare Other | Attending: Family Medicine

## 2012-07-11 DIAGNOSIS — I70219 Atherosclerosis of native arteries of extremities with intermittent claudication, unspecified extremity: Secondary | ICD-10-CM | POA: Diagnosis not present

## 2012-07-11 DIAGNOSIS — Z87891 Personal history of nicotine dependence: Secondary | ICD-10-CM | POA: Diagnosis not present

## 2012-07-11 DIAGNOSIS — R0989 Other specified symptoms and signs involving the circulatory and respiratory systems: Secondary | ICD-10-CM | POA: Diagnosis not present

## 2012-07-11 DIAGNOSIS — R0609 Other forms of dyspnea: Secondary | ICD-10-CM | POA: Diagnosis not present

## 2012-07-11 DIAGNOSIS — E119 Type 2 diabetes mellitus without complications: Secondary | ICD-10-CM | POA: Diagnosis not present

## 2012-07-19 ENCOUNTER — Encounter (HOSPITAL_COMMUNITY): Payer: Self-pay | Admitting: Pharmacy Technician

## 2012-07-26 DIAGNOSIS — R0989 Other specified symptoms and signs involving the circulatory and respiratory systems: Secondary | ICD-10-CM | POA: Diagnosis not present

## 2012-07-26 DIAGNOSIS — R0609 Other forms of dyspnea: Secondary | ICD-10-CM | POA: Diagnosis not present

## 2012-07-26 DIAGNOSIS — Z01810 Encounter for preprocedural cardiovascular examination: Secondary | ICD-10-CM | POA: Diagnosis not present

## 2012-07-26 DIAGNOSIS — I70219 Atherosclerosis of native arteries of extremities with intermittent claudication, unspecified extremity: Secondary | ICD-10-CM | POA: Diagnosis not present

## 2012-07-29 ENCOUNTER — Other Ambulatory Visit: Payer: Self-pay | Admitting: Family Medicine

## 2012-07-30 ENCOUNTER — Other Ambulatory Visit: Payer: Self-pay | Admitting: *Deleted

## 2012-07-30 MED ORDER — EZETIMIBE-SIMVASTATIN 10-40 MG PO TABS: 1.0000 | ORAL_TABLET | ORAL | Status: DC

## 2012-07-30 NOTE — Telephone Encounter (Signed)
Hi Lupita Leash,   Can he follow up in a month or two? I gave him 1 refill and would like to see him again before he needs another refill.   Thanks!

## 2012-07-31 ENCOUNTER — Encounter (HOSPITAL_COMMUNITY): Admission: RE | Payer: Self-pay | Source: Ambulatory Visit

## 2012-07-31 ENCOUNTER — Telehealth: Payer: Self-pay | Admitting: *Deleted

## 2012-07-31 ENCOUNTER — Ambulatory Visit (HOSPITAL_COMMUNITY): Admission: RE | Admit: 2012-07-31 | Payer: Medicare Other | Source: Ambulatory Visit | Admitting: Cardiology

## 2012-07-31 SURGERY — ANGIOGRAM, LOWER EXTREMITY
Anesthesia: LOCAL

## 2012-07-31 NOTE — Telephone Encounter (Signed)
[name] with Dr. Verl Dicker office calling about abnormal labs that came back on Mr. [name].  It shows that he is anemic and his blood glucose is elevated.  Feels patient needs to be followed up by PCP.   Also wanted him seen by GI.  Is already established with Dr. Kenna Gilbert office. Labs faxed over and scanned to chart.  Patient scheduled with cross-cover 08/01/2012 @ 10:00am.  Called and spoke with Dr .Kenna Gilbert office.  Mr. [name] is scheduled for Endoscopy/Colonoscopy on 08/17/2012 @ 11:30am.  Dr. Durene Cal is cross-cover on Wednesday  Will route message to him for review  . Ileana Ladd

## 2012-08-01 ENCOUNTER — Encounter: Payer: Self-pay | Admitting: Family Medicine

## 2012-08-01 ENCOUNTER — Ambulatory Visit (INDEPENDENT_AMBULATORY_CARE_PROVIDER_SITE_OTHER): Payer: Medicare Other | Admitting: Family Medicine

## 2012-08-01 ENCOUNTER — Other Ambulatory Visit: Payer: Self-pay | Admitting: *Deleted

## 2012-08-01 VITALS — BP 121/67 | HR 87 | Temp 98.0°F | Ht 67.0 in | Wt 212.0 lb

## 2012-08-01 DIAGNOSIS — D509 Iron deficiency anemia, unspecified: Secondary | ICD-10-CM | POA: Insufficient documentation

## 2012-08-01 DIAGNOSIS — E119 Type 2 diabetes mellitus without complications: Secondary | ICD-10-CM | POA: Diagnosis not present

## 2012-08-01 DIAGNOSIS — F17200 Nicotine dependence, unspecified, uncomplicated: Secondary | ICD-10-CM | POA: Diagnosis not present

## 2012-08-01 LAB — FERRITIN: Ferritin: 5 ng/mL — ABNORMAL LOW (ref 22–322)

## 2012-08-01 LAB — RETICULOCYTES
ABS Retic: 89.1 10*3/uL (ref 19.0–186.0)
RBC.: 4.95 MIL/uL (ref 4.22–5.81)
Retic Ct Pct: 1.8 % (ref 0.4–2.3)

## 2012-08-01 LAB — IRON AND TIBC
%SAT: 5 % — ABNORMAL LOW (ref 20–55)
Iron: 23 ug/dL — ABNORMAL LOW (ref 42–165)
TIBC: 461 ug/dL — ABNORMAL HIGH (ref 215–435)
UIBC: 438 ug/dL — ABNORMAL HIGH (ref 125–400)

## 2012-08-01 LAB — CBC
HCT: 29.6 % — ABNORMAL LOW (ref 39.0–52.0)
Hemoglobin: 8.9 g/dL — ABNORMAL LOW (ref 13.0–17.0)
MCH: 18 pg — ABNORMAL LOW (ref 26.0–34.0)
MCHC: 30.1 g/dL (ref 30.0–36.0)
MCV: 59.8 fL — ABNORMAL LOW (ref 78.0–100.0)
Platelets: 376 10*3/uL (ref 150–400)
RBC: 4.95 MIL/uL (ref 4.22–5.81)
RDW: 20.8 % — ABNORMAL HIGH (ref 11.5–15.5)
WBC: 5.9 10*3/uL (ref 4.0–10.5)

## 2012-08-01 LAB — POCT GLYCOSYLATED HEMOGLOBIN (HGB A1C): Hemoglobin A1C: 7.2

## 2012-08-01 MED ORDER — GLIPIZIDE 5 MG PO TABS: 5.0000 mg | ORAL_TABLET | Freq: Every day | ORAL | Status: DC

## 2012-08-01 MED ORDER — FERROUS SULFATE 325 (65 FE) MG PO TBEC: 325.0000 mg | DELAYED_RELEASE_TABLET | Freq: Three times a day (TID) | ORAL | Status: DC

## 2012-08-01 NOTE — Assessment & Plan Note (Signed)
Control is very reasonable at this time with a1c of 7.2 especially given not on glipizide. Will restart glipizide at this time.   From communications with Dr. Charleston Poot, unclear what procedure patient was going to have but would recommend holding metformin at least 24 hours before surgery.

## 2012-08-01 NOTE — Assessment & Plan Note (Signed)
Started patient on iron before leaving visit. Ordered iron studies which are consistent with iron deficiency anemia (depleted ferritin, low iron). Patient will likely need supplements for prolonged period to replete stores.   CBC stable from 2 days ago not indicative of active bleeding. Suspect GI bleed source and have encouraged patient to keep his GI follow up with colonoscopy/EGD. Deferred FOBT as already has appointment.

## 2012-08-01 NOTE — Progress Notes (Signed)
Subjective:   Patient presents as same day appointment to discuss labwork as requested by Dr. Charleston Poot. Per patient apparently was supposed to have surgery on Monday for his vascular disease in his leg but labs prevented this from occuring. From phone message-Dr. Joyice Faster primary concerns were hyperglycemia and anemia.      1. DM-Blood glucose noted at 189 on labs. Patient states CBGs at home never over 200. Typically 120-150 in AM which is when he primarily checks. 4 months ago a1c was 7.6 and today is 7.2. Patient states he has not been taking glipizide because pharmacy didn't refill. Patient without any hypoglycemic episodes over last 3 months but thinks may have had 1 when on glipizide but unsure. Aware of hypoglycemia symptoms.   2. Microcytic anemia with hgb 8.9 down from 9.9 several months ago. Patient already has colonoscopy and EGD scheduled with GI. No signs of active bleeding at this time-no hemoptysis, hematuria, melena, BRBPR. DO not see any recent iron studies.   3. Tobacco abuse-cigar smoker. Not ready to quit at this time.    ROS--See HPI  Past Medical History-peripheral vascular disease, hypertension Reviewed problem list.  Medications- reviewed and updated Chief complaint-noted  Objective:  Gen: NAD CV: RRR no mrg Lungs: CTAB Ext: No edema   Assessment/Plan: See problem oriented charted  Needs to follow up to meet new PCP-encouraged visit within the month. Needs comprehensive diabetes exam as well given HM without recent foot exam or optho visit.

## 2012-08-01 NOTE — Assessment & Plan Note (Signed)
Encouraged cessation due to vascular disease. Patient unready at this time due to history of trying with patches, gum. He is willing to consider meeting with Dr. Raymondo Band to discuss cessation and encouraged patient to schedule appointment.

## 2012-08-01 NOTE — Patient Instructions (Addendum)
Dear [name],   It was great to see you today. Thank you for coming to clinic. Please read below regarding the issues that we discussed.   1. I have restarted your glipizide since you ran out 3-4 months ago and your sugars have been worse.  2. I want you to make sure to keep your stomach doctor appointment and get your colonoscopy as your blood counts are low. If you notice rectal bleeding or other active bleed, chest pain, shortness of breath, lightheadedness, you should be seen immediately.  3. I have ordered some iron studies and would like for you to start taking iron to help with your blood count. I have sent this to your pharmacy-they may direct you to over the counter medicines.   Please follow up in clinic within 4 weeks to meet your regular doctor and follow up on other medical problems. Please call earlier if you have any questions or concerns.   Sincerely,  Dr. Tana Conch

## 2012-08-02 ENCOUNTER — Other Ambulatory Visit: Payer: Self-pay | Admitting: Family Medicine

## 2012-08-02 ENCOUNTER — Other Ambulatory Visit: Payer: Self-pay | Admitting: *Deleted

## 2012-08-02 DIAGNOSIS — I10 Essential (primary) hypertension: Secondary | ICD-10-CM

## 2012-08-02 MED ORDER — BENAZEPRIL HCL 20 MG PO TABS: 20.0000 mg | ORAL_TABLET | Freq: Every day | ORAL | Status: DC

## 2012-08-02 NOTE — Telephone Encounter (Signed)
I

## 2012-08-03 MED ORDER — AMLODIPINE BESY-BENAZEPRIL HCL 10-20 MG PO CAPS: 1.0000 | ORAL_CAPSULE | ORAL | Status: DC

## 2012-08-03 NOTE — Addendum Note (Signed)
Addended by: Salomon Mast on: 08/03/2012 09:23 AM   Modules accepted: Orders

## 2012-08-03 NOTE — Telephone Encounter (Signed)
Consulted with Dr. Mauricio Po yesterday about this RX and he sent in just plain benazapril due to contraindication with simvastatin. .  I called pharmacy and cancelled RX sent in by Dr. Ermalinda Memos for the combination amlodipine / benazapril. See note from Dr. Mauricio Po regarding refill yesterday.

## 2012-08-17 DIAGNOSIS — R635 Abnormal weight gain: Secondary | ICD-10-CM | POA: Diagnosis not present

## 2012-08-17 DIAGNOSIS — D509 Iron deficiency anemia, unspecified: Secondary | ICD-10-CM | POA: Diagnosis not present

## 2012-08-17 DIAGNOSIS — K319 Disease of stomach and duodenum, unspecified: Secondary | ICD-10-CM | POA: Diagnosis not present

## 2012-08-17 DIAGNOSIS — K219 Gastro-esophageal reflux disease without esophagitis: Secondary | ICD-10-CM | POA: Diagnosis not present

## 2012-08-17 DIAGNOSIS — K5900 Constipation, unspecified: Secondary | ICD-10-CM | POA: Diagnosis not present

## 2012-08-17 DIAGNOSIS — Z86010 Personal history of colonic polyps: Secondary | ICD-10-CM | POA: Diagnosis not present

## 2012-08-17 DIAGNOSIS — Z1211 Encounter for screening for malignant neoplasm of colon: Secondary | ICD-10-CM | POA: Diagnosis not present

## 2012-08-17 DIAGNOSIS — R1310 Dysphagia, unspecified: Secondary | ICD-10-CM | POA: Diagnosis not present

## 2012-08-17 DIAGNOSIS — R1319 Other dysphagia: Secondary | ICD-10-CM | POA: Diagnosis not present

## 2012-08-17 DIAGNOSIS — D126 Benign neoplasm of colon, unspecified: Secondary | ICD-10-CM | POA: Diagnosis not present

## 2012-08-17 DIAGNOSIS — K2970 Gastritis, unspecified, without bleeding: Secondary | ICD-10-CM | POA: Diagnosis not present

## 2012-08-17 HISTORY — PX: COLONOSCOPY W/ BIOPSIES AND POLYPECTOMY: SHX1376

## 2012-08-17 HISTORY — PX: ESOPHAGOGASTRODUODENOSCOPY: SHX1529

## 2012-08-26 ENCOUNTER — Other Ambulatory Visit: Payer: Self-pay | Admitting: Family Medicine

## 2012-08-27 ENCOUNTER — Other Ambulatory Visit: Payer: Self-pay | Admitting: Family Medicine

## 2012-09-08 ENCOUNTER — Other Ambulatory Visit: Payer: Self-pay | Admitting: Family Medicine

## 2012-09-17 ENCOUNTER — Other Ambulatory Visit: Payer: Self-pay | Admitting: *Deleted

## 2012-09-17 MED ORDER — NEBIVOLOL HCL 20 MG PO TABS: 1.0000 | ORAL_TABLET | Freq: Every day | ORAL | Status: DC

## 2012-09-20 DIAGNOSIS — Z86010 Personal history of colonic polyps: Secondary | ICD-10-CM | POA: Diagnosis not present

## 2012-09-20 DIAGNOSIS — K219 Gastro-esophageal reflux disease without esophagitis: Secondary | ICD-10-CM | POA: Diagnosis not present

## 2012-09-20 DIAGNOSIS — D509 Iron deficiency anemia, unspecified: Secondary | ICD-10-CM | POA: Diagnosis not present

## 2012-09-27 ENCOUNTER — Encounter: Payer: Self-pay | Admitting: Family Medicine

## 2012-10-01 ENCOUNTER — Ambulatory Visit: Payer: Medicare Other

## 2012-10-15 ENCOUNTER — Other Ambulatory Visit: Payer: Self-pay | Admitting: *Deleted

## 2012-10-15 ENCOUNTER — Other Ambulatory Visit: Payer: Self-pay | Admitting: Family Medicine

## 2012-10-15 MED ORDER — BUPROPION HCL ER (XL) 150 MG PO TB24: 150.0000 mg | ORAL_TABLET | ORAL | Status: DC

## 2012-10-24 ENCOUNTER — Other Ambulatory Visit: Payer: Self-pay | Admitting: *Deleted

## 2012-10-24 MED ORDER — EZETIMIBE-SIMVASTATIN 10-40 MG PO TABS: 1.0000 | ORAL_TABLET | ORAL | Status: DC

## 2012-10-31 ENCOUNTER — Encounter: Payer: Self-pay | Admitting: Family Medicine

## 2012-10-31 ENCOUNTER — Ambulatory Visit (INDEPENDENT_AMBULATORY_CARE_PROVIDER_SITE_OTHER): Payer: Medicare Other | Admitting: Family Medicine

## 2012-10-31 VITALS — BP 122/64 | HR 99 | Temp 98.5°F | Resp 16 | Wt 205.0 lb

## 2012-10-31 DIAGNOSIS — E119 Type 2 diabetes mellitus without complications: Secondary | ICD-10-CM | POA: Diagnosis not present

## 2012-10-31 DIAGNOSIS — E162 Hypoglycemia, unspecified: Secondary | ICD-10-CM | POA: Diagnosis not present

## 2012-10-31 LAB — GLUCOSE, CAPILLARY: Glucose-Capillary: 117 mg/dL — ABNORMAL HIGH (ref 70–99)

## 2012-10-31 NOTE — Patient Instructions (Signed)
STOP TAKING GLIPIZIDE. Please continue to check your blood sugars adn create a log for me including the 3x you normally check it as well as what you are doing when you get low blood sugars.   See me back in 1 week because if this is not improving we may need to do some other investigations.

## 2012-10-31 NOTE — Progress Notes (Signed)
Subjective:     Patient ID: [name], male   DOB: 1951-06-14, 61 y.o.   MRN: [id number]  HPI  CC: hypoglycemia Patient presents with concerns about hypoglycemic episodes occurring almost daily over the past month.  He was re-started on Glipizide in August with no problems until the last 4 weeks. Typically CBGs at home run from 120-150; however, in the last month he has had multiple episodes of hypoglycemia at various times throughout the day. He checks his blood sugar if he is feeling dizzy or diaphoretic and notes his blood sugar to be as low as 40 during some of these episodes. He has been carrying around candy at all times in the event of hypoglycemic symptoms. Admits to feeling faint on 2 occasions in which his CBG was found to be low. Denies any chest pain. Denies any shortness of breath.    Review of Systems-- See HPI Past Medical History- PVD, HTN, DM Reviewed problem list. Medications- reviewed and updated Chief complaint-noted     Objective:   Physical Exam Vitals: Wt- 205, T- 98.5, BP 122/64, P 99 Gen: NAD CV: RRR no mrg Lungs: CTAB Ext: No edema  CBG checked in office 117.   Assessment/Plan:   1) Diabetes Mellitus, Type 2. Numerous hypoglycemic episodes after starting on glipizide. Will stop glipizide and have patient monitor blood glucose at home on metformin only. Pt encouraged to contact office if continues having hypoglycemic episodes or if he continues to have dizziness.  Family Medicine Upper Level Addendum:   I have seen and examined the patient independently, discussed with Kenney Houseman, MSIV, fully reviewed the progress ntoe and agree with it's contents as revised above. My independent exam is below.   HPI: Diabetes mellitus, type II with hypoglycemia notes with activity that his CBGs have dropped into <70 range on almost a daily basis. This has been since restarting glipizide but mores specifically over last month. No chest pain, shortness of breath,  palpitations. Does endorse sweatiness, dizziness, hunger, anxiety when CBGs have been low. No dizziness when quickly standing from sitting.   O: BP 122/64  Pulse 99  Temp 98.5 F (36.9 C)  Resp 16  Wt 205 lb (92.987 kg) Gen: NAD, resting comfortably in chair HEENT: MMM, PERRLA  CV: RRR no mrg  Lungs: CTAB  Abd: soft/nontender/nondistended/normal bowel sounds  MSK: moves all extremities, no edema   A/P:  see problem oriented charting   Tana Conch, MD, PGY2 10/31/2012 4:02 PM

## 2012-10-31 NOTE — Assessment & Plan Note (Addendum)
Appears to be related to glipizide usage. Will hold medication. When i restarted medication at last visit, there was a question of whether he had hypoglycemia on glipizide but patient was unsure. At this point, will put in overview to avoid glipizide as he appears overly sensitive to medication. Patient is to return to clinic if symptoms do not resolve when not taking glipizide. Do not believe further work up warranted for dizziness given hypoglycemia noted with each episode.   Patient well aware of hypoglycemia symptoms and treatment.

## 2012-11-07 ENCOUNTER — Ambulatory Visit (INDEPENDENT_AMBULATORY_CARE_PROVIDER_SITE_OTHER): Payer: Medicare Other | Admitting: Family Medicine

## 2012-11-07 ENCOUNTER — Encounter: Payer: Self-pay | Admitting: Family Medicine

## 2012-11-07 VITALS — BP 138/74 | HR 79 | Temp 98.0°F | Ht 67.0 in | Wt 207.0 lb

## 2012-11-07 DIAGNOSIS — Z23 Encounter for immunization: Secondary | ICD-10-CM | POA: Diagnosis not present

## 2012-11-07 DIAGNOSIS — E119 Type 2 diabetes mellitus without complications: Secondary | ICD-10-CM

## 2012-11-07 LAB — POCT GLYCOSYLATED HEMOGLOBIN (HGB A1C): Hemoglobin A1C: 6.4

## 2012-11-07 NOTE — Patient Instructions (Signed)
I am glad you are not having any more low blood sugars. Please stay off of the glipizide. Pass the note along to your pharmacy. You should try to make an apointment for diabetes follow up with Dr. Ermalinda Memos within the next 3 months. He can do a foot exam at that time. I will pass a note along to him to be on the lookout for your colonoscopy information.

## 2012-11-08 NOTE — Assessment & Plan Note (Signed)
A1c at goal at 6.4, unfortunately these readings take into account multiple hypoglycemic episodes due to glipizide. Will continue patient off of glipizide and follow up in 3 months. Patient does to admit to some daily indiscretions including ice cream and cookies DAILY at bedtime and plans to cut this snacking out. Expect can reach a1c goal with metformin only and recommended diet changes.

## 2012-11-08 NOTE — Progress Notes (Signed)
Subjective:   1. Diabetes mellitus, type II-patient presented with hypoglycemia with activity to as low as 40 approximately a week ago. This began since patient started glipizide several months ago  But only within the last month or so. Patient reports no episodes of hypoglycemia. No chest pain, shortness of breath, palpitations. NO sweatiness, dizziness, hunger, anxiety.   CBG log AM 145-180 1 hour after breakfast 137-162 (outlier 209) 1 hour after lunch (120-160) Before dinner 100-145   ROS--See HPI  Past Medical History-smoking status noted: occasional cigar smoker and e-cigarette regular user.  Reviewed problem list.  Medications- reviewed and updated Chief complaint-noted  O: BP 138/74  Pulse 79  Temp 98 F (36.7 C) (Oral)  Ht 5\' 7"  (1.702 m)  Wt 207 lb (93.895 kg)  BMI 32.42 kg/m2 Gen: NAD, resting comfortably in chair HEENT: MMM, PERRLA  CV: RRR no mrg  Lungs: CTAB  Abd: soft/nontender/nondistended/normal bowel sounds  MSK: moves all extremities, no edema   A/P:  see problem oriented charting   Encouraged smoking cessation-patient wants to continue efforts on his own.   Health Maint: Flu shot today Patient had colonoscopy with Dr. Loreta Ave within the last month (no records yet)

## 2012-11-29 ENCOUNTER — Other Ambulatory Visit: Payer: Self-pay | Admitting: Family Medicine

## 2012-12-12 ENCOUNTER — Encounter: Payer: Self-pay | Admitting: Emergency Medicine

## 2012-12-12 ENCOUNTER — Ambulatory Visit (INDEPENDENT_AMBULATORY_CARE_PROVIDER_SITE_OTHER): Payer: Medicare Other | Admitting: Emergency Medicine

## 2012-12-12 VITALS — BP 162/92 | HR 67 | Temp 98.3°F | Ht 67.0 in | Wt 199.5 lb

## 2012-12-12 DIAGNOSIS — R1013 Epigastric pain: Secondary | ICD-10-CM | POA: Insufficient documentation

## 2012-12-12 LAB — COMPREHENSIVE METABOLIC PANEL
ALT: 8 U/L (ref 0–53)
AST: 13 U/L (ref 0–37)
Albumin: 4.2 g/dL (ref 3.5–5.2)
Alkaline Phosphatase: 84 U/L (ref 39–117)
BUN: 11 mg/dL (ref 6–23)
CO2: 25 mEq/L (ref 19–32)
Calcium: 9.7 mg/dL (ref 8.4–10.5)
Chloride: 102 mEq/L (ref 96–112)
Creat: 1.02 mg/dL (ref 0.50–1.35)
Glucose, Bld: 99 mg/dL (ref 70–99)
Potassium: 4.3 mEq/L (ref 3.5–5.3)
Sodium: 137 mEq/L (ref 135–145)
Total Bilirubin: 0.4 mg/dL (ref 0.3–1.2)
Total Protein: 7.6 g/dL (ref 6.0–8.3)

## 2012-12-12 LAB — LIPASE: Lipase: 99 U/L — ABNORMAL HIGH (ref 0–75)

## 2012-12-12 LAB — AMYLASE: Amylase: 57 U/L (ref 0–105)

## 2012-12-12 LAB — CBC
HCT: 44 % (ref 39.0–52.0)
Hemoglobin: 14.4 g/dL (ref 13.0–17.0)
MCH: 23.8 pg — ABNORMAL LOW (ref 26.0–34.0)
MCHC: 32.7 g/dL (ref 30.0–36.0)
MCV: 72.6 fL — ABNORMAL LOW (ref 78.0–100.0)
Platelets: 345 10*3/uL (ref 150–400)
RBC: 6.06 MIL/uL — ABNORMAL HIGH (ref 4.22–5.81)
RDW: 19 % — ABNORMAL HIGH (ref 11.5–15.5)
WBC: 6.7 10*3/uL (ref 4.0–10.5)

## 2012-12-12 LAB — POCT H PYLORI SCREEN: H Pylori Screen, POC: NEGATIVE

## 2012-12-12 NOTE — Assessment & Plan Note (Addendum)
Very broad differential which includes upper GI, pulmonary and cardiac causes.  As he denies any respiratory or cardiac type symptoms, this is likely GI in nature.  I suspect either gastritis/PUD or pancreatitis based on history.  Liver involvement is also possible.  He had EGD and colonoscopy in 07/2012 which were essentially normal except for some mild gastritis.  Will check CMP, CBC, H pylori, lipase, amylase.  Instructed to avoid NSAIDs, stop taking his iron pills for now, and eat a bland diet.  I increased his protonix to 40mg  BID.  He will follow up on Friday to go over lab results and reassess.  Red flags reviewed with patient.

## 2012-12-12 NOTE — Patient Instructions (Addendum)
I am worried that you may have developed a small ulcer in your stomach or have pancreatitis. We are going to get labs today. Please stop the iron pills for now. Do not take any advil, aleve, ibuprofen or motrin. You can take tylenol. Follow up with me on Friday. If you have blood in your stool, start vomiting, or the pain gets worse, please come back immediately or go to the ER for evaluation.

## 2012-12-12 NOTE — Progress Notes (Signed)
  Subjective:    Patient ID: [name], male    DOB: [birth year], 61 y.o.   MRN: [id number]  HPI [name] is here for a SDA for "gas."  He reports "gas" pains since Monday.  The pain is constant with flares.  The pain starts in his stomach and "blows up" then will occasionally travel around his flanks to the shoulder blades.  It is triggered by food. Also states that the food seems to get stuck at his stomach.  Last BM was on Tuesday and was small and soft with some diarrhea.  Stool is dark, but he takes iron pills.  Denies fevers, n/v, blood in stool.  Has tried Gas-X at home with no relief.  He states he had something similar a few years ago and was given a medicine and it improved, but he does not remember what it was called.  SHx: smokes a few cigars daily; 1-2 beers 3x per week   Review of Systems Denies cough, shortness of breath, chest pain, edema    Objective:   Physical Exam BP 162/92  Pulse 67  Temp 98.3 F (36.8 C) (Oral)  Ht 5\' 7"  (1.702 m)  Wt 199 lb 8 oz (90.493 kg)  BMI 31.25 kg/m2 Gen: alert, cooperative, mild distress HEENT: AT/[place], sclera white, MMM Neck: supple CV: RRR, no murmurs Pulm: CTAB, no wheezes or rales Abd: +BS, soft, ND, tender in epigastric and RUQ, no hepatomegaly     Assessment & Plan:

## 2012-12-13 ENCOUNTER — Other Ambulatory Visit: Payer: Self-pay | Admitting: Gastroenterology

## 2012-12-13 DIAGNOSIS — R141 Gas pain: Secondary | ICD-10-CM | POA: Diagnosis not present

## 2012-12-13 DIAGNOSIS — R143 Flatulence: Secondary | ICD-10-CM | POA: Diagnosis not present

## 2012-12-13 DIAGNOSIS — R1011 Right upper quadrant pain: Secondary | ICD-10-CM | POA: Diagnosis not present

## 2012-12-13 DIAGNOSIS — K219 Gastro-esophageal reflux disease without esophagitis: Secondary | ICD-10-CM | POA: Diagnosis not present

## 2012-12-13 DIAGNOSIS — D509 Iron deficiency anemia, unspecified: Secondary | ICD-10-CM | POA: Diagnosis not present

## 2012-12-13 DIAGNOSIS — R1033 Periumbilical pain: Secondary | ICD-10-CM

## 2012-12-14 ENCOUNTER — Telehealth: Payer: Self-pay | Admitting: Emergency Medicine

## 2012-12-14 MED ORDER — HYDROCODONE-ACETAMINOPHEN 5-325 MG PO TABS: 1.0000 | ORAL_TABLET | Freq: Four times a day (QID) | ORAL | Status: DC | PRN

## 2012-12-14 NOTE — Telephone Encounter (Signed)
Called and spoke with patient about his lab results.  He reports that he is feeling a tiny bit better.  Lipase is mildly elevated, but otherwise labs are normal.  Will treat as mild pancreatitis.  No WBC and Hgb is normal.  Discussed clear liquid diet until pain is better and then slowly advancing diet.  Will call in Norco to use as needed for pain.  Instructed to go to the ER if pain worsens over the weekend.  Offered follow up in clinic on Monday, but patient declined at this time.

## 2012-12-15 ENCOUNTER — Other Ambulatory Visit: Payer: Self-pay | Admitting: Family Medicine

## 2012-12-21 ENCOUNTER — Ambulatory Visit (HOSPITAL_COMMUNITY)
Admission: RE | Admit: 2012-12-21 | Discharge: 2012-12-21 | Disposition: A | Discharge status: Home / Self Care | Payer: Medicare Other | Source: Ambulatory Visit | Attending: Gastroenterology | Admitting: Gastroenterology

## 2012-12-21 DIAGNOSIS — N281 Cyst of kidney, acquired: Secondary | ICD-10-CM | POA: Diagnosis not present

## 2012-12-21 DIAGNOSIS — R1033 Periumbilical pain: Secondary | ICD-10-CM | POA: Diagnosis not present

## 2012-12-21 DIAGNOSIS — Q619 Cystic kidney disease, unspecified: Secondary | ICD-10-CM | POA: Diagnosis not present

## 2012-12-28 ENCOUNTER — Encounter (HOSPITAL_COMMUNITY): Payer: Medicare Other

## 2012-12-31 ENCOUNTER — Other Ambulatory Visit: Payer: Self-pay | Admitting: *Deleted

## 2012-12-31 MED ORDER — METFORMIN HCL 500 MG PO TABS: 500.0000 mg | ORAL_TABLET | Freq: Two times a day (BID) | ORAL | Status: DC

## 2012-12-31 MED ORDER — GABAPENTIN 800 MG PO TABS: 800.0000 mg | ORAL_TABLET | Freq: Three times a day (TID) | ORAL | Status: DC

## 2013-01-18 ENCOUNTER — Telehealth: Payer: Self-pay | Admitting: Family Medicine

## 2013-01-18 NOTE — Telephone Encounter (Signed)
Patient dropped off paper to be faxed for diabetic supplies.

## 2013-01-21 NOTE — Telephone Encounter (Signed)
[employer] [facility] [employer] form completed by Dr Ermalinda Memos and faxed to [phone].  [name]

## 2013-03-01 ENCOUNTER — Ambulatory Visit: Payer: Medicare Other | Admitting: Family Medicine

## 2013-03-12 ENCOUNTER — Other Ambulatory Visit: Payer: Self-pay | Admitting: Family Medicine

## 2013-03-12 ENCOUNTER — Ambulatory Visit: Payer: Medicare Other | Admitting: Family Medicine

## 2013-03-26 ENCOUNTER — Encounter: Payer: Self-pay | Admitting: Family Medicine

## 2013-03-26 ENCOUNTER — Telehealth: Payer: Self-pay | Admitting: *Deleted

## 2013-03-26 ENCOUNTER — Ambulatory Visit (INDEPENDENT_AMBULATORY_CARE_PROVIDER_SITE_OTHER): Payer: Medicare Other | Admitting: Family Medicine

## 2013-03-26 ENCOUNTER — Ambulatory Visit
Admission: RE | Admit: 2013-03-26 | Discharge: 2013-03-26 | Disposition: A | Discharge status: Home / Self Care | Payer: Medicare Other | Source: Ambulatory Visit | Attending: Family Medicine | Admitting: Family Medicine

## 2013-03-26 ENCOUNTER — Other Ambulatory Visit: Payer: Medicare Other

## 2013-03-26 VITALS — BP 175/84 | HR 65 | Ht 67.0 in | Wt 207.8 lb

## 2013-03-26 DIAGNOSIS — R109 Unspecified abdominal pain: Secondary | ICD-10-CM | POA: Diagnosis not present

## 2013-03-26 DIAGNOSIS — E119 Type 2 diabetes mellitus without complications: Secondary | ICD-10-CM

## 2013-03-26 DIAGNOSIS — K5900 Constipation, unspecified: Secondary | ICD-10-CM | POA: Diagnosis not present

## 2013-03-26 LAB — COMPREHENSIVE METABOLIC PANEL
ALT: 8 U/L (ref 0–53)
AST: 13 U/L (ref 0–37)
Albumin: 4 g/dL (ref 3.5–5.2)
Alkaline Phosphatase: 70 U/L (ref 39–117)
BUN: 9 mg/dL (ref 6–23)
CO2: 23 mEq/L (ref 19–32)
Calcium: 9 mg/dL (ref 8.4–10.5)
Chloride: 104 mEq/L (ref 96–112)
Creat: 0.92 mg/dL (ref 0.50–1.35)
Glucose, Bld: 107 mg/dL — ABNORMAL HIGH (ref 70–99)
Potassium: 4 mEq/L (ref 3.5–5.3)
Sodium: 138 mEq/L (ref 135–145)
Total Bilirubin: 0.3 mg/dL (ref 0.3–1.2)
Total Protein: 6.8 g/dL (ref 6.0–8.3)

## 2013-03-26 LAB — LIPASE: Lipase: 54 U/L (ref 0–75)

## 2013-03-26 NOTE — Telephone Encounter (Signed)
Called pt. Informed. He will come in for labs today. Lorenda Hatchet, Renato Battles

## 2013-03-26 NOTE — Telephone Encounter (Signed)
Message copied by Arlyss Repress on Tue Mar 26, 2013  1:56 PM ------      Message from: MATTHEWS, Selena Batten      Created: Tue Mar 26, 2013  1:40 PM       Abdominal xray is normal.  Please have patient come in this afternoon or tomorrow morning for additional labwork.             Thanks! ------

## 2013-03-26 NOTE — Telephone Encounter (Signed)
Done

## 2013-03-26 NOTE — Progress Notes (Signed)
CMP,LIPASE AND LACTIC ACID DONE TODAY Brianne Maina

## 2013-03-26 NOTE — Progress Notes (Signed)
  Subjective:    Patient ID: [name], male    DOB: 1951/06/03, 62 y.o.   MRN: [id number]  HPI  1. Abdominal pain:  Here with complaint of abdominal pain x3 weeks duration.  Reports pain is in the right side but tends to migrate all over his stomach.  Describes pain as a "crampy feeling".  Pain is present all the time but worse when eating or drinking.  He denies nausea but does report chronic constipation related to his iron pills.  Has had a similar problem in the past and had a negative H. Pylori, normal esophogram and colonoscopy with 4 benign polyps since 07/2012.  Has not seen any blood in his stool or dark tarry stools.     Review of Systems  Per HPI    Objective:   Physical Exam  Constitutional: He appears well-nourished. No distress.  HENT:  Head: Normocephalic and atraumatic.  Eyes: No scleral icterus.  Neck: Neck supple.  Cardiovascular: Normal rate and regular rhythm.   Pulmonary/Chest: Effort normal and breath sounds normal.  Abdominal: Soft. He exhibits no distension. There is no tenderness. There is no rebound and no guarding.  Diminished bowel sounds No CVA tenderness.   Musculoskeletal: He exhibits no edema.          Assessment & Plan:

## 2013-03-26 NOTE — Assessment & Plan Note (Signed)
Acute on chronic abdominal pain.  Possibly related to constipation, concern for possible obstruction given constipation and diminished bowel sounds, although no distention on exam.  Will get abdominal film for further evaluation.  Check lipase and CMET as well given RUQ pain he is currently having.   Check lactate given history of PAD and anemia, possibly intermittent mesenteric ischemia given worsening with food or drink.  Could consider CT scan if not improving.

## 2013-03-26 NOTE — Patient Instructions (Signed)
I am ordering an xray of your stomach to look for blockage of your intestine You can try an over the counter medication such as dulcolax to help with constipation Follow up with Dr. Ermalinda Memos to discuss your diabetes and blood pressure.

## 2013-03-27 LAB — LACTIC ACID, PLASMA: LACTIC ACID: 1.6 mmol/L (ref 0.5–2.2)

## 2013-04-01 ENCOUNTER — Telehealth: Payer: Self-pay | Admitting: *Deleted

## 2013-04-01 NOTE — Telephone Encounter (Signed)
Called pt. Informed of labs. Lorenda Hatchet, Renato Battles

## 2013-04-01 NOTE — Telephone Encounter (Signed)
Message copied by Arlyss Repress on Mon Apr 01, 2013  9:18 AM ------      Message from: Everrett Coombe      Created: Sun Mar 31, 2013 11:30 PM       Please let patient know that Lab work is normal.  No clear cause of abdominal pain.  If he continues to have pain please have him follow up. ------

## 2013-04-16 ENCOUNTER — Other Ambulatory Visit: Payer: Self-pay | Admitting: Family Medicine

## 2013-04-24 ENCOUNTER — Other Ambulatory Visit: Payer: Self-pay | Admitting: Family Medicine

## 2013-06-02 ENCOUNTER — Other Ambulatory Visit: Payer: Self-pay | Admitting: Family Medicine

## 2013-07-04 ENCOUNTER — Other Ambulatory Visit: Payer: Self-pay

## 2013-07-09 ENCOUNTER — Other Ambulatory Visit: Payer: Self-pay | Admitting: Family Medicine

## 2013-07-17 ENCOUNTER — Encounter: Payer: Medicare Other | Admitting: Family Medicine

## 2013-07-26 ENCOUNTER — Other Ambulatory Visit: Payer: Self-pay | Admitting: Family Medicine

## 2013-07-29 ENCOUNTER — Other Ambulatory Visit: Payer: Self-pay | Admitting: Family Medicine

## 2013-08-12 ENCOUNTER — Ambulatory Visit (INDEPENDENT_AMBULATORY_CARE_PROVIDER_SITE_OTHER): Payer: Medicare Other | Admitting: Family Medicine

## 2013-08-12 ENCOUNTER — Encounter: Payer: Self-pay | Admitting: Family Medicine

## 2013-08-12 VITALS — BP 173/93 | HR 65 | Temp 98.3°F | Ht 67.0 in | Wt 200.0 lb

## 2013-08-12 DIAGNOSIS — Y92009 Unspecified place in unspecified non-institutional (private) residence as the place of occurrence of the external cause: Secondary | ICD-10-CM | POA: Insufficient documentation

## 2013-08-12 DIAGNOSIS — W19XXXA Unspecified fall, initial encounter: Secondary | ICD-10-CM | POA: Insufficient documentation

## 2013-08-12 DIAGNOSIS — IMO0002 Reserved for concepts with insufficient information to code with codable children: Secondary | ICD-10-CM | POA: Diagnosis not present

## 2013-08-12 DIAGNOSIS — W010XXA Fall on same level from slipping, tripping and stumbling without subsequent striking against object, initial encounter: Secondary | ICD-10-CM

## 2013-08-12 MED ORDER — HYDROCODONE-ACETAMINOPHEN 5-325 MG PO TABS: 1.0000 | ORAL_TABLET | Freq: Four times a day (QID) | ORAL | Status: DC | PRN

## 2013-08-12 MED ORDER — MELOXICAM 15 MG PO TABS: 15.0000 mg | ORAL_TABLET | Freq: Every day | ORAL | Status: DC

## 2013-08-12 NOTE — Progress Notes (Signed)
  Subjective:    Patient ID: [name], male    DOB: 08/15/1951, 62 y.o.   MRN: [id number]  HPI  Patient here for same-day appointment after fall States that he fell 2 days ago when he slipped in the bathtub and landed on his L buttocks. He describes them that he had a muscle spasm his right leg which prevented him from standing up for about 5 minutes. After that he was able to ambulate but notes pain in his right buttock, and pain in his right ribs and right flank whenever he breathes deeply.  He denies loss of consciousness and syncope at that time. He denies weakness, our bladder dysfunction, problems walking, and saddle anesthesia.  He has tried tramadol which helped some  Review of Systems Per HPI     Objective:   Physical Exam  Gen: NAD, alert, cooperative with exam HEENT: NCAT CV: RRR, good S1/S2, no murmur Resp: CTABL, no wheezes, non-labored MSK: no bruising over L buttock or L side, no point tenderness across L ribs, +  tenderness with palpation of L flank  No pain with twisting/barrel roll of L leg, no pain with lifting, no pain with sitting cross legged, BL feet symmetric Neuro: Alert and oriented, No gross deficits, slow slightly widened gait but otherwise normal, strength 5/5 and sensation intact on BL LE    Assessment & Plan:

## 2013-08-12 NOTE — Assessment & Plan Note (Signed)
Acute, without history of multiple falls Muscle injury to the left buttock and left flank  Tramadol not improving pain Discussed use of heat and ice Prescription given for Mobic 15 mg to schedule for the next 7 days, also given prescription for Norco 5/325 #30 to use 1-2 every 8 hours when necessary  Discussed red flags of back pain Followup 4 weeks for hypertension diabetes

## 2013-08-12 NOTE — Patient Instructions (Addendum)
It was great to see you today, lets follow up in the next 4 weeks.   I have sent flexeril (for muscle spasm), and hydrocodone (for pain), I've sent mobic  Try heat as we discussed, 15 minutes 4 times a day

## 2013-08-28 ENCOUNTER — Telehealth: Payer: Self-pay | Admitting: Family Medicine

## 2013-08-28 NOTE — Telephone Encounter (Signed)
Forward to PCP for refill request 

## 2013-08-28 NOTE — Telephone Encounter (Signed)
Pt is calling because he would like a refill on his oxycodone if possible. There were no refill on the original prescription. JW

## 2013-08-30 NOTE — Telephone Encounter (Signed)
Cannot refill oxycodone over the phone, will request appt.   Murtis Sink, MD [facility] Resident, PGY-2 08/30/2013, 3:49 PM

## 2013-09-02 NOTE — Telephone Encounter (Signed)
Returned call to patient, he is still having pain on his left side and would like to be seen. I have scheduled him a same day appointment tomorrow with his PCP.Busick, Rodena Medin

## 2013-09-03 ENCOUNTER — Ambulatory Visit
Admission: RE | Admit: 2013-09-03 | Discharge: 2013-09-03 | Disposition: A | Discharge status: Home / Self Care | Payer: Medicare Other | Source: Ambulatory Visit | Attending: Family Medicine | Admitting: Family Medicine

## 2013-09-03 ENCOUNTER — Encounter: Payer: Self-pay | Admitting: Family Medicine

## 2013-09-03 ENCOUNTER — Ambulatory Visit (INDEPENDENT_AMBULATORY_CARE_PROVIDER_SITE_OTHER): Payer: Medicare Other | Admitting: Family Medicine

## 2013-09-03 VITALS — BP 168/101 | HR 71 | Ht 67.0 in | Wt 202.0 lb

## 2013-09-03 DIAGNOSIS — I10 Essential (primary) hypertension: Secondary | ICD-10-CM | POA: Diagnosis not present

## 2013-09-03 DIAGNOSIS — R109 Unspecified abdominal pain: Secondary | ICD-10-CM

## 2013-09-03 DIAGNOSIS — W19XXXD Unspecified fall, subsequent encounter: Secondary | ICD-10-CM

## 2013-09-03 DIAGNOSIS — R52 Pain, unspecified: Secondary | ICD-10-CM | POA: Diagnosis not present

## 2013-09-03 DIAGNOSIS — W19XXXA Unspecified fall, initial encounter: Secondary | ICD-10-CM

## 2013-09-03 DIAGNOSIS — R079 Chest pain, unspecified: Secondary | ICD-10-CM | POA: Diagnosis not present

## 2013-09-03 DIAGNOSIS — D509 Iron deficiency anemia, unspecified: Secondary | ICD-10-CM | POA: Diagnosis not present

## 2013-09-03 DIAGNOSIS — IMO0002 Reserved for concepts with insufficient information to code with codable children: Secondary | ICD-10-CM | POA: Diagnosis not present

## 2013-09-03 DIAGNOSIS — S298XXA Other specified injuries of thorax, initial encounter: Secondary | ICD-10-CM | POA: Diagnosis not present

## 2013-09-03 DIAGNOSIS — Y92009 Unspecified place in unspecified non-institutional (private) residence as the place of occurrence of the external cause: Secondary | ICD-10-CM

## 2013-09-03 LAB — BASIC METABOLIC PANEL
BUN: 10 mg/dL (ref 6–23)
CO2: 25 mEq/L (ref 19–32)
Calcium: 8.9 mg/dL (ref 8.4–10.5)
Chloride: 107 mEq/L (ref 96–112)
Creat: 0.87 mg/dL (ref 0.50–1.35)
Glucose, Bld: 117 mg/dL — ABNORMAL HIGH (ref 70–99)
Potassium: 4.1 mEq/L (ref 3.5–5.3)
Sodium: 138 mEq/L (ref 135–145)

## 2013-09-03 LAB — CBC WITH DIFFERENTIAL/PLATELET
Basophils Absolute: 0 10*3/uL (ref 0.0–0.1)
Basophils Relative: 0 % (ref 0–1)
Eosinophils Absolute: 0.3 10*3/uL (ref 0.0–0.7)
Eosinophils Relative: 4 % (ref 0–5)
HCT: 39.7 % (ref 39.0–52.0)
Hemoglobin: 13 g/dL (ref 13.0–17.0)
Lymphocytes Relative: 34 % (ref 12–46)
Lymphs Abs: 2.4 10*3/uL (ref 0.7–4.0)
MCH: 25.2 pg — ABNORMAL LOW (ref 26.0–34.0)
MCHC: 32.7 g/dL (ref 30.0–36.0)
MCV: 77.1 fL — ABNORMAL LOW (ref 78.0–100.0)
Monocytes Absolute: 0.7 10*3/uL (ref 0.1–1.0)
Monocytes Relative: 9 % (ref 3–12)
Neutro Abs: 3.8 10*3/uL (ref 1.7–7.7)
Neutrophils Relative %: 53 % (ref 43–77)
Platelets: 241 10*3/uL (ref 150–400)
RBC: 5.15 MIL/uL (ref 4.22–5.81)
RDW: 17.2 % — ABNORMAL HIGH (ref 11.5–15.5)
WBC: 7.1 10*3/uL (ref 4.0–10.5)

## 2013-09-03 MED ORDER — HYDROCODONE-ACETAMINOPHEN 5-325 MG PO TABS: 1.0000 | ORAL_TABLET | Freq: Four times a day (QID) | ORAL | Status: DC | PRN

## 2013-09-03 MED ORDER — MELOXICAM 15 MG PO TABS: 15.0000 mg | ORAL_TABLET | Freq: Every day | ORAL | Status: DC

## 2013-09-03 NOTE — Assessment & Plan Note (Addendum)
Developing after fall, concerning for rib fracture with bony tenderness Given 30 more norco CXR with attn to ribs.  No dysuria, fevers, chills, to suggest UTI.

## 2013-09-03 NOTE — Assessment & Plan Note (Signed)
Elevated today, controlled at home per his report Will chalk increase up to pain, continue BB and Ace at current dose F/u 3 months  BMP collected

## 2013-09-03 NOTE — Patient Instructions (Signed)
It was great to see you  If I find a cracked rib I will call and let you know  Rib Fracture Your caregiver has diagnosed you as having a rib fracture (a break). This can occur by a blow to the chest, by a fall against a hard object, or by violent coughing or sneezing. There may be one or many breaks. Rib fractures may heal on their own within 3 to 8 weeks. The longer healing period is usually associated with a continued cough or other aggravating activities. HOME CARE INSTRUCTIONS   Avoid strenuous activity. Be careful during activities and avoid bumping the injured rib. Activities that cause pain pull on the fracture site(s) and are best avoided if possible.  Eat a normal, well-balanced diet. Drink plenty of fluids to avoid constipation.  Take deep breaths several times a day to keep lungs free of infection. Try to cough several times a day, splinting the injured area with a pillow. This will help prevent pneumonia.  Do not wear a rib belt or binder. These restrict breathing which can lead to pneumonia.  Only take over-the-counter or prescription medicines for pain, discomfort, or fever as directed by your caregiver. SEEK MEDICAL CARE IF:  You develop a continual cough, associated with thick or bloody sputum. SEEK IMMEDIATE MEDICAL CARE IF:   You have a fever.  You have difficulty breathing.  You have nausea (feeling sick to your stomach), vomiting, or abdominal (belly) pain.  You have worsening pain, not controlled with medications. Document Released: 12/12/2005 Document Revised: 03/05/2012 Document Reviewed: 05/16/2007 [facility] Patient Information 2014 [place], [place].

## 2013-09-03 NOTE — Progress Notes (Signed)
  Subjective:    Patient ID: [name], male    DOB: 08-Apr-1951, 62 y.o.   MRN: [id number]  HPI  Pt here for follow up of L back/flank pain  He fell in the bathtub about 3 weeks go now, I saw him acutely and gave him NSAID, hydrocodone, and recommended ice and heat, which he did. mobic and hydrocodone helped the most He now states that the L back and buttiock pain has resolved but teh upper L back pain continued.  He denies saddle anesthesia, radiation down leg, bowel or bladder dysf, and weakness or difficulty walking.   HTN Usually at home checks and states that it is 130-140/80s.  Denies HA, chest pain, palpitations, and dyspnea  HAs black stools since starting iron pills 8 months ago.   Review of Systems Per HPi    Objective:   Physical Exam  Gen: NAD, alert, cooperative with exam HEENT: NCAT Ext: No edema, warm Neuro: Alert and oriented, 5/5 strength and sensation intact on BL LE,  MSK: point tenderness around 4th to 5th rib area on back when palpated.   No bony tenderness, or paraspinal tenderness of Low back   Normal gait     Assessment & Plan:

## 2013-09-03 NOTE — Assessment & Plan Note (Signed)
Taking iron, no symptoms Will check CBC and dc iron if WNL

## 2013-09-03 NOTE — Assessment & Plan Note (Signed)
Buttock and low back p[ain resolved, upper back/flank pain continued to even worse Concerned for rib fracture CXR, refilled mobic, given 30 norco

## 2013-09-07 ENCOUNTER — Telehealth: Payer: Self-pay | Admitting: Family Medicine

## 2013-09-07 NOTE — Telephone Encounter (Signed)
Discussed normal Hgb and dc'd PO iron. Will monitor at next visit since he is still microcytic. Consider thalassemia.   Murtis Sink, MD [facility] Resident, PGY-2 09/07/2013, 11:51 AM

## 2013-09-07 NOTE — Telephone Encounter (Signed)
Called to discuss CXR. No rib fractures were identified to explain his pain. I discussed that if this is a muscle strain it should continue to improve. We discussed warning signs for pneumothorax (althiough there are no broken ribs) and cervical radiculopathy, which has not been present at all per his report of exam.   Murtis Sink, MD [facility] Resident, PGY-2 09/07/2013, 11:48 AM

## 2013-09-15 ENCOUNTER — Other Ambulatory Visit: Payer: Self-pay | Admitting: Family Medicine

## 2013-10-08 DIAGNOSIS — Z23 Encounter for immunization: Secondary | ICD-10-CM | POA: Diagnosis not present

## 2013-10-21 ENCOUNTER — Other Ambulatory Visit: Payer: Self-pay | Admitting: Family Medicine

## 2013-11-11 ENCOUNTER — Encounter: Payer: Self-pay | Admitting: Family Medicine

## 2013-11-11 ENCOUNTER — Ambulatory Visit (INDEPENDENT_AMBULATORY_CARE_PROVIDER_SITE_OTHER): Payer: Medicare Other | Admitting: Family Medicine

## 2013-11-11 VITALS — BP 175/95 | HR 65 | Temp 98.4°F | Ht 67.0 in | Wt 207.0 lb

## 2013-11-11 DIAGNOSIS — I10 Essential (primary) hypertension: Secondary | ICD-10-CM | POA: Diagnosis not present

## 2013-11-11 DIAGNOSIS — W19XXXD Unspecified fall, subsequent encounter: Secondary | ICD-10-CM

## 2013-11-11 DIAGNOSIS — E119 Type 2 diabetes mellitus without complications: Secondary | ICD-10-CM

## 2013-11-11 DIAGNOSIS — Z5189 Encounter for other specified aftercare: Secondary | ICD-10-CM

## 2013-11-11 DIAGNOSIS — S46019A Strain of muscle(s) and tendon(s) of the rotator cuff of unspecified shoulder, initial encounter: Secondary | ICD-10-CM | POA: Insufficient documentation

## 2013-11-11 DIAGNOSIS — S46012D Strain of muscle(s) and tendon(s) of the rotator cuff of left shoulder, subsequent encounter: Secondary | ICD-10-CM

## 2013-11-11 DIAGNOSIS — W19XXXA Unspecified fall, initial encounter: Secondary | ICD-10-CM

## 2013-11-11 LAB — POCT GLYCOSYLATED HEMOGLOBIN (HGB A1C): Hemoglobin A1C: 6.5

## 2013-11-11 MED ORDER — BENAZEPRIL HCL 40 MG PO TABS: 40.0000 mg | ORAL_TABLET | Freq: Every day | ORAL | Status: DC

## 2013-11-11 NOTE — Assessment & Plan Note (Signed)
A1C at goal with 6.5 Coninue metformin  500 BID See's [facility] annually

## 2013-11-11 NOTE — Assessment & Plan Note (Signed)
No concearn for tear with full ROM Will manage with NSAIDs and exercises Offered injection, he declines Follow up PRN

## 2013-11-11 NOTE — Assessment & Plan Note (Signed)
Some continue L shoulder pain and L sided pain but improved from previous visit Continue NSAID, has multiple, he would like to  Use only mobic L shoulder consistent with Rotator cuff strain. Offered injection, he states its not that bad yet

## 2013-11-11 NOTE — Progress Notes (Signed)
  Subjective:    Patient ID: [name], male    DOB: [birth year], 62 y.o.   MRN: [id number]  HPI  62 year old male here for followup hypertension and DM2  DM2 Checks CBGs approximately once every other day, cannot remember approximately numbers Takes metformin twice a day  Does not watch diet very carefully, however feels he's well aware of low-carb diet  Hypertension Approximates blood pressures to be average 170s over 70s Denies headache, chest pain, lotion edema, dyspnea. Takes medications daily, has not taken it yet today.  Shoulder pain  Continued since fall, Moderate sharp pain at superior joint line when lifting and abducting arm, no weakness, helped by heat  Review of Systems Per HPI    Objective:   Physical Exam  Gen: NAD, alert, cooperative with exam HEENT: NCAT, EOMI, no papilledema CV: RRR, good S1/S2, no murmur Resp: CTABL, no wheezes, non-labored Abd: SNTND, BS present, no guarding or organomegaly Ext: No edema, warm Neuro: Alert and oriented, strength 5/5 and and sensation intact in all 4 extremities, EOMI, normal gait MSK: L shoulder with Full ROM, PAin at superiro joint line with full abduction at 90 degrees and increases with load.     Assessment & Plan:  See problem specific assessment and plan

## 2013-11-11 NOTE — Patient Instructions (Signed)
I increased your benazapril,  Come back in 1 week for a nursing visit for a blood pressure check Come back to follow up with me in 1 month Try to record your blood pressures so I can get a better idea of how much they change.   Hypertension As your heart beats, it forces blood through your arteries. This force is your blood pressure. If the pressure is too high, it is called hypertension (HTN) or high blood pressure. HTN is dangerous because you may have it and not know it. High blood pressure may mean that your heart has to work harder to pump blood. Your arteries may be narrow or stiff. The extra work puts you at risk for heart disease, stroke, and other problems.  Blood pressure consists of two numbers, a higher number over a lower, 110/72, for example. It is stated as "110 over 72." The ideal is below 120 for the top number (systolic) and under 80 for the bottom (diastolic). Write down your blood pressure today. You should pay close attention to your blood pressure if you have certain conditions such as:  Heart failure.  Prior heart attack.  Diabetes  Chronic kidney disease.  Prior stroke.  Multiple risk factors for heart disease. To see if you have HTN, your blood pressure should be measured while you are seated with your arm held at the level of the heart. It should be measured at least twice. A one-time elevated blood pressure reading (especially in the Emergency Department) does not mean that you need treatment. There may be conditions in which the blood pressure is different between your right and left arms. It is important to see your caregiver soon for a recheck. Most people have essential hypertension which means that there is not a specific cause. This type of high blood pressure may be lowered by changing lifestyle factors such as:  Stress.  Smoking.  Lack of exercise.  Excessive weight.  Drug/tobacco/alcohol use.  Eating less salt. Most people do not have symptoms  from high blood pressure until it has caused damage to the body. Effective treatment can often prevent, delay or reduce that damage. TREATMENT  When a cause has been identified, treatment for high blood pressure is directed at the cause. There are a large number of medications to treat HTN. These fall into several categories, and your caregiver will help you select the medicines that are best for you. Medications may have side effects. You should review side effects with your caregiver. If your blood pressure stays high after you have made lifestyle changes or started on medicines,   Your medication(s) may need to be changed.  Other problems may need to be addressed.  Be certain you understand your prescriptions, and know how and when to take your medicine.  Be sure to follow up with your caregiver within the time frame advised (usually within two weeks) to have your blood pressure rechecked and to review your medications.  If you are taking more than one medicine to lower your blood pressure, make sure you know how and at what times they should be taken. Taking two medicines at the same time can result in blood pressure that is too low. SEEK IMMEDIATE MEDICAL CARE IF:  You develop a severe headache, blurred or changing vision, or confusion.  You have unusual weakness or numbness, or a faint feeling.  You have severe chest or abdominal pain, vomiting, or breathing problems. MAKE SURE YOU:   Understand these instructions.  Will  watch your condition.  Will get help right away if you are not doing well or get worse. Document Released: 12/12/2005 Document Revised: 03/05/2012 Document Reviewed: 08/01/2008 [facility] Patient Information 2014 [place], [place].

## 2013-11-11 NOTE — Assessment & Plan Note (Signed)
Very high today but no red flags to indicate urgency Improved slightly on re-check, to 175/95 from 207/109 Increase benazapril to 40 daily BP check in 1 week (after taking meds) F/u 1 month, encouraged logging BPs Red flags HTN uregency/emerg provided

## 2013-11-15 ENCOUNTER — Encounter (HOSPITAL_COMMUNITY): Payer: Self-pay | Admitting: Family Medicine

## 2013-11-18 ENCOUNTER — Ambulatory Visit (INDEPENDENT_AMBULATORY_CARE_PROVIDER_SITE_OTHER): Payer: Medicare Other | Admitting: *Deleted

## 2013-11-18 VITALS — BP 131/59 | HR 66

## 2013-11-18 DIAGNOSIS — I10 Essential (primary) hypertension: Secondary | ICD-10-CM

## 2013-12-12 ENCOUNTER — Encounter: Payer: Self-pay | Admitting: Family Medicine

## 2013-12-12 ENCOUNTER — Ambulatory Visit (INDEPENDENT_AMBULATORY_CARE_PROVIDER_SITE_OTHER): Payer: Medicare Other | Admitting: Family Medicine

## 2013-12-12 VITALS — BP 166/96 | HR 74 | Temp 97.6°F | Ht 67.0 in | Wt 207.0 lb

## 2013-12-12 DIAGNOSIS — E7800 Pure hypercholesterolemia, unspecified: Secondary | ICD-10-CM | POA: Diagnosis not present

## 2013-12-12 DIAGNOSIS — M1990 Unspecified osteoarthritis, unspecified site: Secondary | ICD-10-CM | POA: Diagnosis not present

## 2013-12-12 DIAGNOSIS — I10 Essential (primary) hypertension: Secondary | ICD-10-CM

## 2013-12-12 LAB — LDL CHOLESTEROL, DIRECT: Direct LDL: 47 mg/dL

## 2013-12-12 MED ORDER — NAPROXEN 500 MG PO TABS: 500.0000 mg | ORAL_TABLET | Freq: Two times a day (BID) | ORAL | Status: DC

## 2013-12-12 MED ORDER — HYDROCHLOROTHIAZIDE 25 MG PO TABS: 25.0000 mg | ORAL_TABLET | Freq: Every day | ORAL | Status: DC

## 2013-12-12 NOTE — Assessment & Plan Note (Signed)
Still elevated, uncontrolled No red flags Along with hyperlipidemia, smoking, and PAD he has significant risk factors for CAD so will continue to be aggressive. Asked him to log his pressures Started HCTZ 25 mg, blood pressure check in 2 weeks, followup in 2 months with me.

## 2013-12-12 NOTE — Patient Instructions (Signed)
It was great to see you!  Come back in 2 weeks for a nurse visit for a blood pressure check  COme back in 2 months for a follow up

## 2013-12-12 NOTE — Assessment & Plan Note (Addendum)
Severe, bilateral shoulders major complaint today. Discussed with him options of shoulder injection starting on the left as it seems more severe. He would like to avoid this as long as possible as he would not like his blood sugar get out of control. Discussed with him that naproxen has the lowest incidence of her complications of the other NSAIDs, so DC'd meloxicam and started naproxen today.  After the schedule ice and NSAIDs for one week and then try to use NSAIDs only when necessary. Would consider sports medicine versus for the referral, but would plan to image with plain film first.

## 2013-12-12 NOTE — Assessment & Plan Note (Signed)
Watching diet, on Vytorin Checking LDL today.

## 2013-12-12 NOTE — Progress Notes (Signed)
Patient ID: [name], male   DOB: 1951/08/25, 62 y.o.   MRN: [id number]  Kevin Fenton, MD Phone: [phone]  Subjective:  Chief complaint-noted  # F/U HTN  62 year old male with diabetes and hypertension on his last visit I maximized his benazepril she's been taking. He states he is very compliant with his medications. He denies headache, chest pain, lotion edema. He does endorse claudication in his right leg greater than his left after about 25 years walking playground. He also notes mild dyspnea and wheezing. He's been checking his blood pressure at home and states that it's usually 180 over 80s.  Tobacco States he's been taking Wellbutrin to help quit smoking for about 2-3 years. He does not feel that is helping he is not actively trying to quit. He still smokes 2 cigars daily. He would like to stop Wellbutrin.  Hyperlipidemia States that he watches his diet, eating mostly baby foods, avoiding fried or fatty foods. He's compliant with his Vytorin  Shoulder pain He states that it has not improved since her last visit. He now has shoulder pain in both shoulders, which he says is been pretty bad for probably 5-10 years. He states the worst time is whenever he is trying to fall sleep at night. He also notes decreased range of motion with left upper extremity. He is previously been through physical therapy did not feel it helped. He takes Mobic daily and feels like it helps.  ROS- No fever, chills, sweats Positive dyspnea No chest pain Positive claudication No nausea, vomiting, diarrhea.   Past Medical History Patient Active Problem List   Diagnosis Date Noted  . Rotator cuff strain 11/11/2013  . Flank pain, acute 09/03/2013  . Fall at home 08/12/2013  . Abdominal pain, unspecified site 03/26/2013  . Epigastric pain 12/12/2012  . Microcytic anemia 08/01/2012  . Lumbar strain 05/10/2012  . Dysphagia 01/19/2012  . Neck pain 10/26/2011  . DIABETES MELLITUS, TYPE II  06/18/2009  . GERD 01/14/2009  . HYPERCHOLESTEROLEMIA 02/22/2007  . OBESITY, NOS 02/22/2007  . TOBACCO DEPENDENCE 02/22/2007  . HYPERTENSION, BENIGN SYSTEMIC 02/22/2007  . PERIPHERAL VASCULAR DISEASE, UNSPEC. 02/22/2007  . OSTEOARTHRITIS, MULTI SITES 02/22/2007    Medications- reviewed and updated Current Outpatient Prescriptions  Medication Sig Dispense Refill  . aspirin 325 MG tablet Take 650 mg by mouth every morning.       . benazepril (LOTENSIN) 40 MG tablet Take 1 tablet (40 mg total) by mouth daily.  90 tablet  3  . clopidogrel (PLAVIX) 75 MG tablet Take 75 mg by mouth daily.      Marland Kitchen gabapentin (NEURONTIN) 800 MG tablet Take 1 tablet (800 mg total) by mouth 3 (three) times daily.  90 tablet  11  . metFORMIN (GLUCOPHAGE) 500 MG tablet Take 1 tablet (500 mg total) by mouth 2 (two) times daily with a meal.  60 tablet  11  . Nebivolol HCl (BYSTOLIC) 20 MG TABS Take 1 tablet (20 mg total) by mouth daily.  30 tablet  11  . pantoprazole (PROTONIX) 40 MG tablet TAKE 1 TABLET BY MOUTH EVERY DAY  30 tablet  11  . VYTORIN 10-40 MG per tablet TAKE 1 TABLET BY MOUTH EVERY MORNING.  30 tablet  5  . cilostazol (PLETAL) 100 MG tablet 100 mg Twice daily.      . ferrous sulfate 325 (65 FE) MG EC tablet Take 1 tablet (325 mg total) by mouth 3 (three) times daily with meals.  90 tablet  11  .  hydrochlorothiazide (HYDRODIURIL) 25 MG tablet Take 1 tablet (25 mg total) by mouth daily.  90 tablet  3  . HYDROcodone-acetaminophen (NORCO) 5-325 MG per tablet Take 1-2 tablets by mouth every 6 (six) hours as needed for pain.  30 tablet  0  . nabumetone (RELAFEN) 500 MG tablet Take 500 mg by mouth daily.      . nabumetone (RELAFEN) 500 MG tablet TAKE 1 TABLET EVERY DAY  30 tablet  3  . naproxen (NAPROSYN) 500 MG tablet Take 1 tablet (500 mg total) by mouth 2 (two) times daily with a meal.  60 tablet  2  . traMADol (ULTRAM) 50 MG tablet Take 50 mg by mouth 2 (two) times daily as needed. FOR PAIN       No  current facility-administered medications for this visit.    Objective: BP 166/96  Pulse 74  Temp(Src) 97.6 F (36.4 C) (Oral)  Ht 5\' 7"  (1.702 m)  Wt 207 lb (93.895 kg)  BMI 32.41 kg/m2 Gen: NAD, alert, cooperative with exam HEENT: NCAT, EOMI, PERRL CV: RRR, good S1/S2, no murmur Resp: CTABL, no wheezes, non-labored Ext: No edema, warm, Hawkins and Neer test mildly positive bilaterally at, decreased range of motion particularly in the left with less than full extension of his arm upward. Neuro: Alert and oriented, No gross deficits   Assessment/Plan:  HYPERTENSION, BENIGN SYSTEMIC Still elevated, uncontrolled No red flags Along with hyperlipidemia, smoking, and PAD he has significant risk factors for CAD so will continue to be aggressive. Asked him to log his pressures Started HCTZ 25 mg, blood pressure check in 2 weeks, followup in 2 months with me.   OSTEOARTHRITIS, MULTI SITES Severe, bilateral shoulders major complaint today. Discussed with him options of shoulder injection starting on the left as it seems more severe. He would like to avoid this as long as possible as he would not like his blood sugar get out of control. Discussed with him that naproxen has the lowest incidence of her complications of the other NSAIDs, so DC'd meloxicam and started naproxen today.  After the schedule ice and NSAIDs for one week and then try to use NSAIDs only when necessary. Would consider sports medicine versus for the referral, but would plan to image with plain film first.  HYPERCHOLESTEROLEMIA Watching diet, on Vytorin Checking LDL today.    Orders Placed This Encounter  Procedures  . LDL Cholesterol, Direct    Meds ordered this encounter  Medications  . hydrochlorothiazide (HYDRODIURIL) 25 MG tablet    Sig: Take 1 tablet (25 mg total) by mouth daily.    Dispense:  90 tablet    Refill:  3  . naproxen (NAPROSYN) 500 MG tablet    Sig: Take 1 tablet (500 mg total) by  mouth 2 (two) times daily with a meal.    Dispense:  60 tablet    Refill:  2

## 2013-12-31 ENCOUNTER — Other Ambulatory Visit: Payer: Self-pay | Admitting: Family Medicine

## 2014-01-01 ENCOUNTER — Other Ambulatory Visit: Payer: Self-pay | Admitting: Family Medicine

## 2014-01-30 ENCOUNTER — Other Ambulatory Visit: Payer: Self-pay | Admitting: Family Medicine

## 2014-02-22 ENCOUNTER — Other Ambulatory Visit: Payer: Self-pay | Admitting: Family Medicine

## 2014-02-27 ENCOUNTER — Ambulatory Visit (INDEPENDENT_AMBULATORY_CARE_PROVIDER_SITE_OTHER): Payer: Medicare Other | Admitting: Family Medicine

## 2014-02-27 ENCOUNTER — Encounter: Payer: Self-pay | Admitting: Family Medicine

## 2014-02-27 VITALS — BP 113/74 | HR 91 | Ht 67.0 in | Wt 210.0 lb

## 2014-02-27 DIAGNOSIS — I10 Essential (primary) hypertension: Secondary | ICD-10-CM | POA: Diagnosis not present

## 2014-02-27 DIAGNOSIS — E1149 Type 2 diabetes mellitus with other diabetic neurological complication: Secondary | ICD-10-CM

## 2014-02-27 DIAGNOSIS — E119 Type 2 diabetes mellitus without complications: Secondary | ICD-10-CM

## 2014-02-27 DIAGNOSIS — E1142 Type 2 diabetes mellitus with diabetic polyneuropathy: Secondary | ICD-10-CM | POA: Diagnosis not present

## 2014-02-27 DIAGNOSIS — E1140 Type 2 diabetes mellitus with diabetic neuropathy, unspecified: Secondary | ICD-10-CM | POA: Insufficient documentation

## 2014-02-27 LAB — POCT GLYCOSYLATED HEMOGLOBIN (HGB A1C): Hemoglobin A1C: 7.3

## 2014-02-27 MED ORDER — VENLAFAXINE HCL ER 37.5 MG PO CP24: 37.5000 mg | ORAL_CAPSULE | Freq: Every day | ORAL | Status: DC

## 2014-02-27 MED ORDER — METFORMIN HCL 1000 MG PO TABS: 1000.0000 mg | ORAL_TABLET | Freq: Two times a day (BID) | ORAL | Status: DC

## 2014-02-27 NOTE — Progress Notes (Signed)
Patient ID: [name], male   DOB: 10/21/1951, 63 y.o.   MRN: [id number]  [name], MD Phone: [phone]  Subjective:  Chief complaint-noted  # Patient here for followup of diabetes and right foot pain   Diabetes Still not checking his blood sugars at home, states that the supply company working with has not send strips yet  Takes his medications as prescribed Watches his carbohydrate intake, but has had dietary indiscretions during Christmas holiday  Foot exam performed today  Right foot pain Has history of ongoing diabetic neuropathy with glove and stocking distribution numbness and tingling. Has improved on Neurontin now taking 800 mg 3 times a day States for the last 2 weeks has had right foot shooting electric type pain starting in his right big toe going up to his midcalf. This happens whenever he sits down after walking, he denies trauma or swelling or redness. He cannot associate it with any dietary changes and is worried that he has gout. He had cracks in his bilateral heels which resolved with soaking and Vaseline application.  HTN Doesn't check at home, no chest pain, dyspnea, palpitations, or leg edema  ROS- Per history of present illness  Past Medical History Patient Active Problem List   Diagnosis Date Noted  . Diabetic neuropathy 02/27/2014  . Rotator cuff strain 11/11/2013  . Flank pain, acute 09/03/2013  . Fall at home 08/12/2013  . Abdominal pain, unspecified site 03/26/2013  . Epigastric pain 12/12/2012  . Microcytic anemia 08/01/2012  . Lumbar strain 05/10/2012  . Dysphagia 01/19/2012  . Neck pain 10/26/2011  . DIABETES MELLITUS, TYPE II 06/18/2009  . GERD 01/14/2009  . HYPERCHOLESTEROLEMIA 02/22/2007  . OBESITY, NOS 02/22/2007  . TOBACCO DEPENDENCE 02/22/2007  . HYPERTENSION, BENIGN SYSTEMIC 02/22/2007  . PERIPHERAL VASCULAR DISEASE, UNSPEC. 02/22/2007  . OSTEOARTHRITIS, MULTI SITES 02/22/2007    Medications- reviewed and  updated Current Outpatient Prescriptions  Medication Sig Dispense Refill  . aspirin 325 MG tablet Take 650 mg by mouth every morning.       . benazepril (LOTENSIN) 40 MG tablet Take 1 tablet (40 mg total) by mouth daily.  90 tablet  3  . cilostazol (PLETAL) 100 MG tablet 100 mg Twice daily.      . clopidogrel (PLAVIX) 75 MG tablet 1 (ONE) TABLET, ORAL, DAILY  90 tablet  3  . ferrous sulfate 325 (65 FE) MG EC tablet Take 1 tablet (325 mg total) by mouth 3 (three) times daily with meals.  90 tablet  11  . gabapentin (NEURONTIN) 800 MG tablet TAKE 1 TABLET (800 MG TOTAL) BY MOUTH 3 (THREE) TIMES DAILY.  90 tablet  3  . hydrochlorothiazide (HYDRODIURIL) 25 MG tablet Take 1 tablet (25 mg total) by mouth daily.  90 tablet  3  . HYDROcodone-acetaminophen (NORCO) 5-325 MG per tablet Take 1-2 tablets by mouth every 6 (six) hours as needed for pain.  30 tablet  0  . metFORMIN (GLUCOPHAGE) 1000 MG tablet Take 1 tablet (1,000 mg total) by mouth 2 (two) times daily with a meal.  60 tablet  6  . nabumetone (RELAFEN) 500 MG tablet Take 500 mg by mouth daily.      . nabumetone (RELAFEN) 500 MG tablet TAKE 1 TABLET EVERY DAY  30 tablet  3  . naproxen (NAPROSYN) 500 MG tablet Take 1 tablet (500 mg total) by mouth 2 (two) times daily with a meal.  60 tablet  2  . Nebivolol HCl (BYSTOLIC) 20 MG TABS Take  1 tablet (20 mg total) by mouth daily.  30 tablet  11  . pantoprazole (PROTONIX) 40 MG tablet TAKE 1 TABLET BY MOUTH EVERY DAY  30 tablet  11  . traMADol (ULTRAM) 50 MG tablet Take 50 mg by mouth 2 (two) times daily as needed. FOR PAIN      . venlafaxine XR (EFFEXOR-XR) 37.5 MG 24 hr capsule Take 1 capsule (37.5 mg total) by mouth daily with breakfast.  49 capsule  0  . VYTORIN 10-40 MG per tablet TAKE 1 TABLET BY MOUTH EVERY MORNING.  30 tablet  5   No current facility-administered medications for this visit.    Objective: BP 113/74  Pulse 91  Ht 5\' 7"  (1.702 m)  Wt 210 lb (95.255 kg)  BMI 32.88  kg/m2 Gen: NAD, alert, cooperative with exam HEENT: NCAT CV: RRR, good S1/S2, no murmur Resp: CTABL, no wheezes, non-labored Abd: SNTND, BS present, no guarding or organomegaly Ext: No edema, warm Neuro: Alert and oriented, No gross deficits Diabetic foot exam: Monofilament sensation intact in bilateral feet, healed cracks/gravis on right heel, sensation intact to monofilament but decreased on bilateral heel pads  - No erythema or swelling of any joints  Assessment/Plan:  DIABETES MELLITUS, TYPE II Control slightly worse, A1C 7.3 Increase metformin to 1 g BID Discussed diet Recheck 3 months Also has neuropathy  Diabetic neuropathy Worsened, continue gabapentin Add effexor, titrate up in 1 month (37.5 for 1 week the 75 for 3, 150 @ f/u next month) Foot exam ok, healed cracks on R heal.  Consider gout, no signs on exam today, Check uric acid level next blood draw  HYPERTENSION, BENIGN SYSTEMIC No red flags, controlled Continue Ace, BB, and HCTZ    Orders Placed This Encounter  Procedures  . POCT HgB A1C    Meds ordered this encounter  Medications  . metFORMIN (GLUCOPHAGE) 1000 MG tablet    Sig: Take 1 tablet (1,000 mg total) by mouth 2 (two) times daily with a meal.    Dispense:  60 tablet    Refill:  6  . venlafaxine XR (EFFEXOR-XR) 37.5 MG 24 hr capsule    Sig: Take 1 capsule (37.5 mg total) by mouth daily with breakfast.    Dispense:  49 capsule    Refill:  0

## 2014-02-27 NOTE — Assessment & Plan Note (Addendum)
No red flags, controlled Continue Ace, BB, and HCTZ

## 2014-02-27 NOTE — Assessment & Plan Note (Addendum)
Worsened, continue gabapentin Add effexor, titrate up in 1 month (37.5 for 1 week the 75 for 3, 150 @ f/u next month) Foot exam ok, healed cracks on R heal.  Consider gout, no signs on exam today, Check uric acid level next blood draw

## 2014-02-27 NOTE — Assessment & Plan Note (Signed)
Control slightly worse, A1C 7.3 Increase metformin to 1 g BID Discussed diet Recheck 3 months Also has neuropathy

## 2014-02-27 NOTE — Patient Instructions (Addendum)
It was great to see you today, Come back in 3-4 weeks  I  Think your diabetic neuropathy is getting worse- the way to treat this is better sugar control  I will also start effexor  Check your feet everyday  I have increased your metformin  Diet Recommendations for Diabetes   Starchy (carb) foods include: Bread, rice, pasta, potatoes, corn, crackers, bagels, muffins, all baked goods.   Protein foods include: Meat, fish, poultry, eggs, dairy foods, and beans such as pinto and kidney beans (beans also provide carbohydrate).   1. Eat at least 3 meals and 1-2 snacks per day. Never go more than 4-5 hours while awake without eating.  2. Limit starchy foods to TWO per meal and ONE per snack. ONE portion of a starchy  food is equal to the following:   - ONE slice of bread (or its equivalent, such as half of a hamburger bun).   - 1/2 cup of a "scoopable" starchy food such as potatoes or rice.   - 1 OUNCE (28 grams) of starchy snack foods such as crackers or pretzels (look on label).   - 15 grams of carbohydrate as shown on food label.  3. Both lunch and dinner should include a protein food, a carb food, and vegetables.   - Obtain twice as many veg's as protein or carbohydrate foods for both lunch and dinner.   - Try to keep frozen veg's on hand for a quick vegetable serving.     - Fresh or frozen veg's are best.  4. Breakfast should always include protein.

## 2014-03-05 ENCOUNTER — Other Ambulatory Visit: Payer: Self-pay | Admitting: Family Medicine

## 2014-03-26 ENCOUNTER — Other Ambulatory Visit: Payer: Self-pay | Admitting: Family Medicine

## 2014-04-08 ENCOUNTER — Other Ambulatory Visit: Payer: Self-pay | Admitting: Family Medicine

## 2014-04-17 ENCOUNTER — Encounter: Payer: Medicare Other | Admitting: Family Medicine

## 2014-04-23 ENCOUNTER — Ambulatory Visit (INDEPENDENT_AMBULATORY_CARE_PROVIDER_SITE_OTHER): Payer: Medicare Other | Admitting: Family Medicine

## 2014-04-23 ENCOUNTER — Encounter: Payer: Self-pay | Admitting: Family Medicine

## 2014-04-23 VITALS — BP 116/72 | HR 85 | Temp 97.4°F | Ht 67.0 in | Wt 198.0 lb

## 2014-04-23 DIAGNOSIS — K6289 Other specified diseases of anus and rectum: Secondary | ICD-10-CM

## 2014-04-23 DIAGNOSIS — K625 Hemorrhage of anus and rectum: Secondary | ICD-10-CM

## 2014-04-23 LAB — CBC WITH DIFFERENTIAL/PLATELET
Basophils Absolute: 0.1 10*3/uL (ref 0.0–0.1)
Basophils Relative: 1 % (ref 0–1)
Eosinophils Absolute: 0.4 10*3/uL (ref 0.0–0.7)
Eosinophils Relative: 6 % — ABNORMAL HIGH (ref 0–5)
HCT: 31 % — ABNORMAL LOW (ref 39.0–52.0)
Hemoglobin: 9.8 g/dL — ABNORMAL LOW (ref 13.0–17.0)
Lymphocytes Relative: 46 % (ref 12–46)
Lymphs Abs: 3.3 10*3/uL (ref 0.7–4.0)
MCH: 20.8 pg — ABNORMAL LOW (ref 26.0–34.0)
MCHC: 31.6 g/dL (ref 30.0–36.0)
MCV: 65.7 fL — ABNORMAL LOW (ref 78.0–100.0)
Monocytes Absolute: 0.7 10*3/uL (ref 0.1–1.0)
Monocytes Relative: 10 % (ref 3–12)
Neutro Abs: 2.6 10*3/uL (ref 1.7–7.7)
Neutrophils Relative %: 37 % — ABNORMAL LOW (ref 43–77)
Platelets: 369 10*3/uL (ref 150–400)
RBC: 4.72 MIL/uL (ref 4.22–5.81)
RDW: 18.1 % — ABNORMAL HIGH (ref 11.5–15.5)
WBC: 7.1 10*3/uL (ref 4.0–10.5)

## 2014-04-23 LAB — COMPREHENSIVE METABOLIC PANEL
ALT: 10 U/L (ref 0–53)
AST: 15 U/L (ref 0–37)
Albumin: 4.1 g/dL (ref 3.5–5.2)
Alkaline Phosphatase: 61 U/L (ref 39–117)
BUN: 16 mg/dL (ref 6–23)
CO2: 28 mEq/L (ref 19–32)
Calcium: 9.6 mg/dL (ref 8.4–10.5)
Chloride: 99 mEq/L (ref 96–112)
Creat: 0.99 mg/dL (ref 0.50–1.35)
Glucose, Bld: 121 mg/dL — ABNORMAL HIGH (ref 70–99)
Potassium: 3.9 mEq/L (ref 3.5–5.3)
Sodium: 137 mEq/L (ref 135–145)
Total Bilirubin: 0.2 mg/dL (ref 0.2–1.2)
Total Protein: 6.9 g/dL (ref 6.0–8.3)

## 2014-04-23 MED ORDER — HYDROCORTISONE 2.5 % RE CREA: 1.0000 "application " | TOPICAL_CREAM | Freq: Two times a day (BID) | RECTAL | Status: DC

## 2014-04-23 MED ORDER — CIPROFLOXACIN HCL 500 MG PO TABS: 500.0000 mg | ORAL_TABLET | Freq: Two times a day (BID) | ORAL | Status: DC

## 2014-04-23 MED ORDER — METRONIDAZOLE 500 MG PO TABS: 500.0000 mg | ORAL_TABLET | Freq: Three times a day (TID) | ORAL | Status: DC

## 2014-04-23 NOTE — Assessment & Plan Note (Signed)
Likely bleeding external hemorrhoid with his pain but possibly diverticulitis with bleed with abd pain Abd exam benign and patient appears well, HR 80s Tx diverticulitis with 10 days cipro and flagyl Check CBC Anusol cream for hemorrhoid, continue sitz baths.  Come back in 5-7 days to ensure improvement and for rectal exam

## 2014-04-23 NOTE — Progress Notes (Signed)
Patient ID: [name], male   DOB: December 02, 1951, 63 y.o.   MRN: [id number]  [name], MD Phone: [phone]  Subjective:  Chief complaint-noted  # rectal bleeding, rectal pain  Pt describes 2 weeks of continuous sharp rectal pain and imoproving recatl bleeding.   He describes that the bleeding was abpprox 1/2 cup twice daily about 2 weeks ago and is now on the order of 1 teaspoon. His rectal pain is worsened by sitting and improved with sitting in a warm bath. He is having milder intermittent LLQ pain for the same time period.   He has been having dereased PO intake due to not wanting to poop but states he is drinking well and is still hungry.  He has slight weakness but overall feels well.  ROS- No fever or malaise Some generalized weakness No chest pain or dyspnea + abd pain  + rectal pain   Past Medical History Patient Active Problem List   Diagnosis Date Noted  . Rectal bleeding 04/23/2014  . Diabetic neuropathy 02/27/2014  . Rotator cuff strain 11/11/2013  . Flank pain, acute 09/03/2013  . Fall at home 08/12/2013  . Abdominal pain, unspecified site 03/26/2013  . Epigastric pain 12/12/2012  . Microcytic anemia 08/01/2012  . Lumbar strain 05/10/2012  . Dysphagia 01/19/2012  . Neck pain 10/26/2011  . DIABETES MELLITUS, TYPE II 06/18/2009  . GERD 01/14/2009  . HYPERCHOLESTEROLEMIA 02/22/2007  . OBESITY, NOS 02/22/2007  . TOBACCO DEPENDENCE 02/22/2007  . HYPERTENSION, BENIGN SYSTEMIC 02/22/2007  . PERIPHERAL VASCULAR DISEASE, UNSPEC. 02/22/2007  . OSTEOARTHRITIS, MULTI SITES 02/22/2007    Medications- reviewed and updated Current Outpatient Prescriptions  Medication Sig Dispense Refill  . aspirin 325 MG tablet Take 650 mg by mouth every morning.       . benazepril (LOTENSIN) 40 MG tablet Take 1 tablet (40 mg total) by mouth daily.  90 tablet  3  . cilostazol (PLETAL) 100 MG tablet 100 mg Twice daily.      . ciprofloxacin (CIPRO) 500 MG tablet Take 1  tablet (500 mg total) by mouth 2 (two) times daily.  20 tablet  0  . clopidogrel (PLAVIX) 75 MG tablet 1 (ONE) TABLET, ORAL, DAILY  90 tablet  3  . ferrous sulfate 325 (65 FE) MG EC tablet Take 1 tablet (325 mg total) by mouth 3 (three) times daily with meals.  90 tablet  11  . gabapentin (NEURONTIN) 800 MG tablet TAKE 1 TABLET (800 MG TOTAL) BY MOUTH 3 (THREE) TIMES DAILY.  90 tablet  3  . hydrochlorothiazide (HYDRODIURIL) 25 MG tablet Take 1 tablet (25 mg total) by mouth daily.  90 tablet  3  . HYDROcodone-acetaminophen (NORCO) 5-325 MG per tablet Take 1-2 tablets by mouth every 6 (six) hours as needed for pain.  30 tablet  0  . hydrocortisone (PROCTOSOL HC) 2.5 % rectal cream Place 1 application rectally 2 (two) times daily.  30 g  0  . metFORMIN (GLUCOPHAGE) 1000 MG tablet Take 1 tablet (1,000 mg total) by mouth 2 (two) times daily with a meal.  60 tablet  6  . metroNIDAZOLE (FLAGYL) 500 MG tablet Take 1 tablet (500 mg total) by mouth 3 (three) times daily.  30 tablet  0  . nabumetone (RELAFEN) 500 MG tablet Take 500 mg by mouth daily.      . nabumetone (RELAFEN) 500 MG tablet TAKE 1 TABLET EVERY DAY  30 tablet  3  . naproxen (NAPROSYN) 500 MG tablet TAKE 1 TABLET (  500 MG TOTAL) BY MOUTH 2 (TWO) TIMES DAILY WITH A MEAL.  60 tablet  2  . Nebivolol HCl (BYSTOLIC) 20 MG TABS Take 1 tablet (20 mg total) by mouth daily.  30 tablet  11  . pantoprazole (PROTONIX) 40 MG tablet TAKE 1 TABLET BY MOUTH EVERY DAY  30 tablet  11  . traMADol (ULTRAM) 50 MG tablet Take 50 mg by mouth 2 (two) times daily as needed. FOR PAIN      . venlafaxine XR (EFFEXOR-XR) 75 MG 24 hr capsule Take 1 capsule (75 mg total) by mouth daily with breakfast. This is a higher dose than last month  30 capsule  0  . VYTORIN 10-40 MG per tablet TAKE 1 TABLET BY MOUTH EVERY MORNING.  30 tablet  5   No current facility-administered medications for this visit.    Objective: BP 116/72  Pulse 85  Temp(Src) 97.4 F (36.3 C) (Oral)   Ht 5\' 7"  (1.702 m)  Wt 198 lb (89.812 kg)  BMI 31.00 kg/m2 Gen: NAD, alert, cooperative with exam HEENT: NCAT CV: RRR, good S1/S2, no murmur Resp: CTABL, no wheezes, non-labored Abd: Soft, mild tenderness to palpation in LLQ, no rebound  Ext: No edema, warm Neuro: Alert and oriented, No gross deficits Rectal exam- no apparent ext hemmrhoid or fissure, unable to tolerate DRE due to pain   Assessment/Plan:  Rectal bleeding Likely bleeding external hemorrhoid with his pain but possibly diverticulitis with bleed with abd pain Abd exam benign and patient appears well, HR 80s Tx diverticulitis with 10 days cipro and flagyl Check CBC Anusol cream for hemorrhoid, continue sitz baths.  Come back in 5-7 days to ensure improvement and for rectal exam    Orders Placed This Encounter  Procedures  . CBC with Differential  . Comprehensive metabolic panel    Meds ordered this encounter  Medications  . ciprofloxacin (CIPRO) 500 MG tablet    Sig: Take 1 tablet (500 mg total) by mouth 2 (two) times daily.    Dispense:  20 tablet    Refill:  0  . metroNIDAZOLE (FLAGYL) 500 MG tablet    Sig: Take 1 tablet (500 mg total) by mouth 3 (three) times daily.    Dispense:  30 tablet    Refill:  0  . hydrocortisone (PROCTOSOL HC) 2.5 % rectal cream    Sig: Place 1 application rectally 2 (two) times daily.    Dispense:  30 g    Refill:  0

## 2014-04-23 NOTE — Patient Instructions (Signed)
I think you have diverticulitis and hemmrhoids  The antibiotics are for the diverticulitis, the hydrocortisone is for the hemmrhoids.   Please come back in 1 week to make sure everything is going well.

## 2014-04-30 ENCOUNTER — Ambulatory Visit (INDEPENDENT_AMBULATORY_CARE_PROVIDER_SITE_OTHER): Payer: Medicare Other | Admitting: Family Medicine

## 2014-04-30 ENCOUNTER — Encounter: Payer: Self-pay | Admitting: Family Medicine

## 2014-04-30 VITALS — BP 90/50 | HR 108 | Temp 98.3°F | Ht 67.0 in | Wt 196.0 lb

## 2014-04-30 DIAGNOSIS — R252 Cramp and spasm: Secondary | ICD-10-CM | POA: Diagnosis not present

## 2014-04-30 DIAGNOSIS — K625 Hemorrhage of anus and rectum: Secondary | ICD-10-CM | POA: Diagnosis not present

## 2014-04-30 DIAGNOSIS — R109 Unspecified abdominal pain: Secondary | ICD-10-CM

## 2014-04-30 DIAGNOSIS — I70219 Atherosclerosis of native arteries of extremities with intermittent claudication, unspecified extremity: Secondary | ICD-10-CM

## 2014-04-30 DIAGNOSIS — D509 Iron deficiency anemia, unspecified: Secondary | ICD-10-CM | POA: Diagnosis not present

## 2014-04-30 LAB — POCT URINALYSIS DIPSTICK
Bilirubin, UA: NEGATIVE
Blood, UA: NEGATIVE
Glucose, UA: NEGATIVE
Ketones, UA: NEGATIVE
Nitrite, UA: NEGATIVE
Protein, UA: NEGATIVE
Spec Grav, UA: >=1.03
Urobilinogen, UA: 0.2
pH, UA: 5.5

## 2014-04-30 LAB — POCT UA - MICROSCOPIC ONLY

## 2014-04-30 MED ORDER — CILOSTAZOL 100 MG PO TABS: 100.0000 mg | ORAL_TABLET | Freq: Two times a day (BID) | ORAL | Status: DC

## 2014-04-30 MED ORDER — CIPROFLOXACIN HCL 500 MG PO TABS: 500.0000 mg | ORAL_TABLET | Freq: Two times a day (BID) | ORAL | Status: DC

## 2014-04-30 MED ORDER — FERROUS SULFATE 325 (65 FE) MG PO TBEC: 325.0000 mg | DELAYED_RELEASE_TABLET | Freq: Three times a day (TID) | ORAL | Status: DC

## 2014-04-30 NOTE — Assessment & Plan Note (Addendum)
Previously felt this may be due to diverticulitis with his rectal bleeding, however after the rectal exam is a tender prostate. Collect UA today, will consider adding 11 additional days of Cipro to complete a three-week course even if his urinalysis looks benign as a tender prostate and abdominal pain since clinically likely enough to be prostatitis.  Addendum UA with 1-5 epithelial, trace leukocytes, and trace bacteria I called and discussed with him he finished his course of Cipro about 2 days ago, I sent him 2 more weeks of Cipro which will more than treat the standard 3 weeks for prostatitis.

## 2014-04-30 NOTE — Assessment & Plan Note (Signed)
States that he is now status post stent placement Refilled Pletal, possibly causing his current leg cramping

## 2014-04-30 NOTE — Assessment & Plan Note (Signed)
With recent multiple stools a day and low blood pressure 90/50 today. Will consider that dehydration related collection lab abnormalities is very likely. Discussed this with the patient and he would like to defer lab evaluation today. Will hold HCTZ for 3 days and encouraged aggressive by mouth hydration with sugar-free Gatorade for the next 3 days.

## 2014-04-30 NOTE — Progress Notes (Signed)
Patient ID: [name], male   DOB: 06/09/1951, 63 y.o.   MRN: [id number]  Brian File, MD Phone: [phone]  Subjective:  Chief complaint-noted  Pt Here for rectal bleeding, rectal pain, abdominal pain  Rectal bleeding   R states that it resolved a couple days after he last saw me. States that he felt a hemorrhoid whenever he is upon Anusol cream. His rectal pain has mostly resolved and describes it now as a 3/10.  Abdominal pain intermittently continued. He denies diarrhea, dysuria, trouble urination, trouble with his flow, foul-smelling urine.  Leg cramps States that since Sunday he's had intermittent leg cramps in both legs. He knows he is peripheral vascular disease but has not taken Pletal in quite a while. He has stents replaced by vascular surgery.  ROS- Per hpi  Past Medical History Patient Active Problem List   Diagnosis Date Noted  . Leg cramps 04/30/2014  . Rectal bleeding 04/23/2014  . Diabetic neuropathy 02/27/2014  . Rotator cuff strain 11/11/2013  . Flank pain, acute 09/03/2013  . Fall at home 08/12/2013  . Abdominal pain, unspecified site 03/26/2013  . Epigastric pain 12/12/2012  . Microcytic anemia 08/01/2012  . Lumbar strain 05/10/2012  . Dysphagia 01/19/2012  . Neck pain 10/26/2011  . DIABETES MELLITUS, TYPE II 06/18/2009  . GERD 01/14/2009  . HYPERCHOLESTEROLEMIA 02/22/2007  . OBESITY, NOS 02/22/2007  . TOBACCO DEPENDENCE 02/22/2007  . HYPERTENSION, BENIGN SYSTEMIC 02/22/2007  . PERIPHERAL VASCULAR DISEASE, UNSPEC. 02/22/2007  . OSTEOARTHRITIS, MULTI SITES 02/22/2007    Medications- reviewed and updated Current Outpatient Prescriptions  Medication Sig Dispense Refill  . aspirin 325 MG tablet Take 650 mg by mouth every morning.       . benazepril (LOTENSIN) 40 MG tablet Take 1 tablet (40 mg total) by mouth daily.  90 tablet  3  . cilostazol (PLETAL) 100 MG tablet Take 1 tablet (100 mg total) by mouth 2 (two) times daily.  60 tablet  5    . ciprofloxacin (CIPRO) 500 MG tablet Take 1 tablet (500 mg total) by mouth 2 (two) times daily.  28 tablet  0  . clopidogrel (PLAVIX) 75 MG tablet 1 (ONE) TABLET, ORAL, DAILY  90 tablet  3  . ferrous sulfate 325 (65 FE) MG EC tablet Take 1 tablet (325 mg total) by mouth 3 (three) times daily with meals.  90 tablet  11  . gabapentin (NEURONTIN) 800 MG tablet TAKE 1 TABLET (800 MG TOTAL) BY MOUTH 3 (THREE) TIMES DAILY.  90 tablet  3  . hydrochlorothiazide (HYDRODIURIL) 25 MG tablet Take 1 tablet (25 mg total) by mouth daily.  90 tablet  3  . HYDROcodone-acetaminophen (NORCO) 5-325 MG per tablet Take 1-2 tablets by mouth every 6 (six) hours as needed for pain.  30 tablet  0  . hydrocortisone (PROCTOSOL HC) 2.5 % rectal cream Place 1 application rectally 2 (two) times daily.  30 g  0  . metFORMIN (GLUCOPHAGE) 1000 MG tablet Take 1 tablet (1,000 mg total) by mouth 2 (two) times daily with a meal.  60 tablet  6  . metroNIDAZOLE (FLAGYL) 500 MG tablet Take 1 tablet (500 mg total) by mouth 3 (three) times daily.  30 tablet  0  . nabumetone (RELAFEN) 500 MG tablet Take 500 mg by mouth daily.      . nabumetone (RELAFEN) 500 MG tablet TAKE 1 TABLET EVERY DAY  30 tablet  3  . naproxen (NAPROSYN) 500 MG tablet TAKE 1 TABLET (500 MG  TOTAL) BY MOUTH 2 (TWO) TIMES DAILY WITH A MEAL.  60 tablet  2  . Nebivolol HCl (BYSTOLIC) 20 MG TABS Take 1 tablet (20 mg total) by mouth daily.  30 tablet  11  . pantoprazole (PROTONIX) 40 MG tablet TAKE 1 TABLET BY MOUTH EVERY DAY  30 tablet  11  . traMADol (ULTRAM) 50 MG tablet Take 50 mg by mouth 2 (two) times daily as needed. FOR PAIN      . venlafaxine XR (EFFEXOR-XR) 75 MG 24 hr capsule Take 1 capsule (75 mg total) by mouth daily with breakfast. This is a higher dose than last month  30 capsule  0  . VYTORIN 10-40 MG per tablet TAKE 1 TABLET BY MOUTH EVERY MORNING.  30 tablet  5   No current facility-administered medications for this visit.    Objective: BP 90/50   Pulse 108  Temp(Src) 98.3 F (36.8 C) (Oral)  Ht 5\' 7"  (1.702 m)  Wt 196 lb (88.905 kg)  BMI 30.69 kg/m2 Gen: NAD, alert, cooperative with exam HEENT: NCAT CV: RRR, good S1/S2, no murmur Resp: CTABL, no wheezes, non-labored Abd: SNTND, BS present, no guarding or organomegaly Ext: No edema, warm Neuro: Alert and oriented, No gross deficits Rectal: Small external hemorrhoid at 9:00, pain at the anal verge, average size prostate with normal consistency but tender to palpation.   fecal occult blood test negative   Assessment/Plan:  Microcytic anemia Acute due to blood loss, hemoglobin at last visit was 9.8. Rectal bleeding is resolved and he has no symptoms so we'll start iron and defer CBC today.  PERIPHERAL VASCULAR DISEASE, UNSPEC. States that he is now status post stent placement Refilled Pletal, possibly causing his current leg cramping  Leg cramps With recent multiple stools a day and low blood pressure 90/50 today. Will consider that dehydration related collection lab abnormalities is very likely. Discussed this with the patient and he would like to defer lab evaluation today. Will hold HCTZ for 3 days and encouraged aggressive by mouth hydration with sugar-free Gatorade for the next 3 days.  Abdominal pain, unspecified site Previously felt this may be due to diverticulitis with his rectal bleeding, however after the rectal exam is a tender prostate. Collect UA today, will consider adding 11 additional days of Cipro to complete a three-week course even if his urinalysis looks benign as a tender prostate and abdominal pain since clinically likely enough to be prostatitis.  Addendum UA with 1-5 epithelial, trace leukocytes, and trace bacteria I called and discussed with him he finished his course of Cipro about 2 days ago, I sent him 2 more weeks of Cipro which will more than treat the standard 3 weeks for prostatitis.    Orders Placed This Encounter  Procedures  . POCT  urinalysis dipstick  . POCT UA - Microscopic Only    Meds ordered this encounter  Medications  . ferrous sulfate 325 (65 FE) MG EC tablet    Sig: Take 1 tablet (325 mg total) by mouth 3 (three) times daily with meals.    Dispense:  90 tablet    Refill:  11  . cilostazol (PLETAL) 100 MG tablet    Sig: Take 1 tablet (100 mg total) by mouth 2 (two) times daily.    Dispense:  60 tablet    Refill:  5  . ciprofloxacin (CIPRO) 500 MG tablet    Sig: Take 1 tablet (500 mg total) by mouth 2 (two) times daily.    Dispense:  28 tablet    Refill:  0

## 2014-04-30 NOTE — Patient Instructions (Signed)
Stop taking HCTZ for the next three days and get plenty of fluids.   I have refilled pletal.   Come back in about 4-6 weeks

## 2014-04-30 NOTE — Assessment & Plan Note (Signed)
Acute due to blood loss, hemoglobin at last visit was 9.8. Rectal bleeding is resolved and he has no symptoms so we'll start iron and defer CBC today.

## 2014-05-02 NOTE — Progress Notes (Signed)
This encounter was created in error - please disregard.

## 2014-06-01 ENCOUNTER — Other Ambulatory Visit: Payer: Self-pay | Admitting: Family Medicine

## 2014-06-17 ENCOUNTER — Other Ambulatory Visit: Payer: Self-pay | Admitting: Family Medicine

## 2014-06-17 DIAGNOSIS — K6289 Other specified diseases of anus and rectum: Secondary | ICD-10-CM | POA: Diagnosis not present

## 2014-06-17 DIAGNOSIS — K5900 Constipation, unspecified: Secondary | ICD-10-CM | POA: Diagnosis not present

## 2014-06-17 DIAGNOSIS — K602 Anal fissure, unspecified: Secondary | ICD-10-CM | POA: Diagnosis not present

## 2014-06-17 DIAGNOSIS — Z86010 Personal history of colonic polyps: Secondary | ICD-10-CM | POA: Diagnosis not present

## 2014-06-17 NOTE — Telephone Encounter (Signed)
Refilled vytorin, I beleive this is the second time today.   Denied naprosyn as it was last sent #60 with 2 R on 06/02/2014.   Will ask staff to call and explain.   Laroy Apple, MD [place] Resident, PGY-2 06/17/2014, 12:19 PM

## 2014-06-18 NOTE — Telephone Encounter (Signed)
Called patient and explained that his naprosyn would not be refilled and why and the vytorin was refilled and is at pharmacy for pickup. Thanks, [employer]

## 2014-08-06 ENCOUNTER — Telehealth: Payer: Self-pay | Admitting: Family Medicine

## 2014-08-06 NOTE — Telephone Encounter (Signed)
Pt received letter stating hydorclor---blood pressure medication-has been recalled Please let pt know what he can take now

## 2014-08-07 NOTE — Telephone Encounter (Signed)
Left message on patient voicemail that if he received one of these letters he can call the pharmacy and they will correct this.

## 2014-08-07 NOTE — Telephone Encounter (Signed)
Will ask him to get a new refill of HCTZ.   Laroy Apple, MD [place] Resident, PGY-3 08/07/2014, 12:26 PM

## 2014-08-23 ENCOUNTER — Other Ambulatory Visit: Payer: Self-pay | Admitting: Family Medicine

## 2014-08-27 ENCOUNTER — Encounter: Payer: Self-pay | Admitting: Family Medicine

## 2014-08-27 ENCOUNTER — Ambulatory Visit (INDEPENDENT_AMBULATORY_CARE_PROVIDER_SITE_OTHER): Payer: Medicare Other | Admitting: Family Medicine

## 2014-08-27 VITALS — BP 123/74 | HR 89 | Temp 98.1°F | Wt 200.0 lb

## 2014-08-27 DIAGNOSIS — R809 Proteinuria, unspecified: Secondary | ICD-10-CM | POA: Diagnosis not present

## 2014-08-27 DIAGNOSIS — R05 Cough: Secondary | ICD-10-CM

## 2014-08-27 DIAGNOSIS — Z23 Encounter for immunization: Secondary | ICD-10-CM | POA: Diagnosis not present

## 2014-08-27 DIAGNOSIS — I10 Essential (primary) hypertension: Secondary | ICD-10-CM | POA: Diagnosis not present

## 2014-08-27 DIAGNOSIS — D509 Iron deficiency anemia, unspecified: Secondary | ICD-10-CM

## 2014-08-27 DIAGNOSIS — R059 Cough, unspecified: Secondary | ICD-10-CM

## 2014-08-27 DIAGNOSIS — M542 Cervicalgia: Secondary | ICD-10-CM | POA: Diagnosis not present

## 2014-08-27 DIAGNOSIS — E119 Type 2 diabetes mellitus without complications: Secondary | ICD-10-CM | POA: Diagnosis not present

## 2014-08-27 LAB — CBC WITH DIFFERENTIAL/PLATELET
Basophils Absolute: 0.1 10*3/uL (ref 0.0–0.1)
Basophils Relative: 1 % (ref 0–1)
Eosinophils Absolute: 0.2 10*3/uL (ref 0.0–0.7)
Eosinophils Relative: 4 % (ref 0–5)
HCT: 28.3 % — ABNORMAL LOW (ref 39.0–52.0)
Hemoglobin: 8.7 g/dL — ABNORMAL LOW (ref 13.0–17.0)
Lymphocytes Relative: 31 % (ref 12–46)
Lymphs Abs: 1.9 10*3/uL (ref 0.7–4.0)
MCH: 21.3 pg — ABNORMAL LOW (ref 26.0–34.0)
MCHC: 30.7 g/dL (ref 30.0–36.0)
MCV: 69.2 fL — ABNORMAL LOW (ref 78.0–100.0)
Monocytes Absolute: 1 10*3/uL (ref 0.1–1.0)
Monocytes Relative: 16 % — ABNORMAL HIGH (ref 3–12)
Neutro Abs: 2.9 10*3/uL (ref 1.7–7.7)
Neutrophils Relative %: 48 % (ref 43–77)
Platelets: 285 10*3/uL (ref 150–400)
RBC: 4.09 MIL/uL — ABNORMAL LOW (ref 4.22–5.81)
RDW: 20.5 % — ABNORMAL HIGH (ref 11.5–15.5)
WBC: 6 10*3/uL (ref 4.0–10.5)

## 2014-08-27 LAB — POCT GLYCOSYLATED HEMOGLOBIN (HGB A1C): Hemoglobin A1C: 6.2

## 2014-08-27 NOTE — Assessment & Plan Note (Addendum)
Left leg pain with some radiation down to his left arm, no loss of function or weakness Will followup with his neurosurgeon Discussed to keep his NSAIDs as PRN as possible., With slightly increased risk of CVD Not interfering with his lifestyle, recommended ice and heat and followup with neurosurgery if that's interfering with his daily life

## 2014-08-27 NOTE — Progress Notes (Signed)
Patient ID: [name], male   DOB: 10/20/1951, 63 y.o.   MRN: [id number]  [name], MD Phone: [phone]  Subjective:  Chief complaint-noted  Pt Here for followup hypertension diabetes  Hypertension Taking his medications everyday Checking blood pressure at home ranges 120-130/70-80 Headaches the morning for the last week and a half associated with his cough. No chest pain, leg edema, or palpitations.  Diabetes Does not check blood sugars at home Taking metformin every day Some mild numbness and feeling of his bilateral feet  Cough Complaining of cough productive of green sputum for the last 10 days or so. Does have some mild associated dyspnea, and mid nonradiating chest pain with cough. No radiation of chest pain to the shoulder or jaw, nonexertional Also complains of congestion and mild headache in the morning when he wakes up  Smoking: Smokes 2 cigars a day, never a cigarette smoker. Not really wanting to quit  ROS-  Per history of present illness Also no fever, chills, sweats Normal by mouth intake   Past Medical History Patient Active Problem List   Diagnosis Date Noted  . Proteinuria 08/27/2014  . Cough 08/27/2014  . Leg cramps 04/30/2014  . Rectal bleeding 04/23/2014  . Diabetic neuropathy 02/27/2014  . Rotator cuff strain 11/11/2013  . Flank pain, acute 09/03/2013  . Fall at home 08/12/2013  . Abdominal pain, unspecified site 03/26/2013  . Epigastric pain 12/12/2012  . Microcytic anemia 08/01/2012  . Lumbar strain 05/10/2012  . Dysphagia 01/19/2012  . Neck pain 10/26/2011  . DIABETES MELLITUS, TYPE II 06/18/2009  . GERD 01/14/2009  . HYPERCHOLESTEROLEMIA 02/22/2007  . OBESITY, NOS 02/22/2007  . TOBACCO DEPENDENCE 02/22/2007  . HYPERTENSION, BENIGN SYSTEMIC 02/22/2007  . PERIPHERAL VASCULAR DISEASE, UNSPEC. 02/22/2007  . OSTEOARTHRITIS, MULTI SITES 02/22/2007    Medications- reviewed and updated Current Outpatient Prescriptions    Medication Sig Dispense Refill  . aspirin 325 MG tablet Take 650 mg by mouth every morning.       . benazepril (LOTENSIN) 40 MG tablet Take 1 tablet (40 mg total) by mouth daily.  90 tablet  3  . cilostazol (PLETAL) 100 MG tablet Take 1 tablet (100 mg total) by mouth 2 (two) times daily.  60 tablet  5  . ciprofloxacin (CIPRO) 500 MG tablet Take 1 tablet (500 mg total) by mouth 2 (two) times daily.  28 tablet  0  . clopidogrel (PLAVIX) 75 MG tablet 1 (ONE) TABLET, ORAL, DAILY  90 tablet  3  . ferrous sulfate 325 (65 FE) MG EC tablet Take 1 tablet (325 mg total) by mouth 3 (three) times daily with meals.  90 tablet  11  . gabapentin (NEURONTIN) 800 MG tablet TAKE 1 TABLET (800 MG TOTAL) BY MOUTH 3 (THREE) TIMES DAILY.  90 tablet  3  . hydrochlorothiazide (HYDRODIURIL) 25 MG tablet Take 1 tablet (25 mg total) by mouth daily.  90 tablet  3  . HYDROcodone-acetaminophen (NORCO) 5-325 MG per tablet Take 1-2 tablets by mouth every 6 (six) hours as needed for pain.  30 tablet  0  . hydrocortisone (PROCTOSOL HC) 2.5 % rectal cream Place 1 application rectally 2 (two) times daily.  30 g  0  . metFORMIN (GLUCOPHAGE) 1000 MG tablet Take 1 tablet (1,000 mg total) by mouth 2 (two) times daily with a meal.  60 tablet  6  . metroNIDAZOLE (FLAGYL) 500 MG tablet Take 1 tablet (500 mg total) by mouth 3 (three) times daily.  30 tablet  0  . nabumetone (RELAFEN) 500 MG tablet Take 500 mg by mouth daily.      . nabumetone (RELAFEN) 500 MG tablet TAKE 1 TABLET EVERY DAY  30 tablet  3  . naproxen (NAPROSYN) 500 MG tablet TAKE 1 TABLET (500 MG TOTAL) BY MOUTH 2 (TWO) TIMES DAILY WITH A MEAL.  60 tablet  0  . Nebivolol HCl (BYSTOLIC) 20 MG TABS Take 1 tablet (20 mg total) by mouth daily.  30 tablet  11  . pantoprazole (PROTONIX) 40 MG tablet TAKE 1 TABLET BY MOUTH EVERY DAY  30 tablet  11  . traMADol (ULTRAM) 50 MG tablet Take 50 mg by mouth 2 (two) times daily as needed. FOR PAIN      . venlafaxine XR (EFFEXOR-XR) 75 MG  24 hr capsule Take 1 capsule (75 mg total) by mouth daily with breakfast. This is a higher dose than last month  30 capsule  0  . VYTORIN 10-40 MG per tablet TAKE 1 TABLET BY MOUTH EVERY MORNING.  30 tablet  11   No current facility-administered medications for this visit.    Objective: BP 123/74  Pulse 89  Temp(Src) 98.1 F (36.7 C) (Oral)  Wt 200 lb (90.719 kg) Gen: NAD, alert, cooperative with exam HEENT: NCAT, turbinates slightly swollen bilaterally CV: RRR, good S1/S2, no murmur Resp: CTABL, no wheezes, non-labored Ext: No edema, warm Neuro: Alert and oriented, No gross deficits  Medical exam: 1+ DP pulse bilaterally Intact monofilament throughout except on right heel No lesions   Assessment/Plan:  HYPERTENSION, BENIGN SYSTEMIC Controlled today, Off of HCTZ for about a month do to recall So well controlled at home and here today that will continue off of HCTZ for now. Followup 3 months. Patient checking at home and will call for uncontrolled blood pressures.  Cough With intermittent dyspnea all talking Lungs clear Considering duration of symptoms, 10 days, will do chest x-ray to rule out pneumonia Red flags reviewed for followup the patient will contact if he worsens. Add Claritin for congestion, seems to have coughing spells seasonally with the onset of fall. Also question COPD with chronic smoking status, however cough is not seen that chronic.  DIABETES MELLITUS, TYPE II Well controlled with A1c of 6.4 today. Continue metformin 1 g twice a day Check urine protein creatinine ratio today - previously had greater than 300 today, severe proteinuria range placing him at high risk for diabetic nephropathy. Followup 3 months  Neck pain Left leg pain with some radiation down to his left arm, no loss of function or weakness Will followup with his neurosurgeon Discussed to keep his NSAIDs as PRN as possible., With slightly increased risk of CVD Not interfering with  his lifestyle, recommended ice and heat and followup with neurosurgery if that's interfering with his daily life     Orders Placed This Encounter  Procedures  . DG Chest 2 View    Standing Status: Future     Number of Occurrences:      Standing Expiration Date: 10/28/2015    Order Specific Question:  Reason for Exam (SYMPTOM  OR DIAGNOSIS REQUIRED)    Answer:  cough, dyspnea, r/o pNA    Order Specific Question:  Preferred imaging location?    Answer:  [facility]  . CBC with Differential  . Protein / Creatinine Ratio, Urine  . POCT HgB A1C

## 2014-08-27 NOTE — Assessment & Plan Note (Signed)
Controlled today, Off of HCTZ for about a month do to recall So well controlled at home and here today that will continue off of HCTZ for now. Followup 3 months. Patient checking at home and will call for uncontrolled blood pressures.

## 2014-08-27 NOTE — Patient Instructions (Signed)
Great to see you!  Come back in 3 months  Stop the HCTZ for now (the recalled medicine), if your blood pressures are persistently over 482 systolic then call me and we will restart the medicine.

## 2014-08-27 NOTE — Assessment & Plan Note (Addendum)
With intermittent dyspnea all talking Lungs clear Considering duration of symptoms, 10 days, will do chest x-ray to rule out pneumonia Red flags reviewed for followup the patient will contact if he worsens. Add Claritin for congestion, seems to have coughing spells seasonally with the onset of fall. Also question COPD with chronic smoking status, however cough is not seen that chronic.

## 2014-08-27 NOTE — Assessment & Plan Note (Signed)
Well controlled with A1c of 6.4 today. Continue metformin 1 g twice a day Check urine protein creatinine ratio today - previously had greater than 300 today, severe proteinuria range placing him at high risk for diabetic nephropathy. Followup 3 months

## 2014-08-28 ENCOUNTER — Telehealth: Payer: Self-pay | Admitting: Family Medicine

## 2014-08-28 DIAGNOSIS — D509 Iron deficiency anemia, unspecified: Secondary | ICD-10-CM

## 2014-08-28 LAB — PROTEIN / CREATININE RATIO, URINE
Creatinine, Urine: 124.9 mg/dL
Protein Creatinine Ratio: 0.07 (ref <0.15)
Total Protein, Urine: 9 mg/dL (ref 5–25)

## 2014-08-28 MED ORDER — FERUMOXYTOL INJECTION 510 MG/17 ML: 1020.0000 mg | Freq: Once | INTRAVENOUS | Status: DC

## 2014-08-28 NOTE — Telephone Encounter (Addendum)
Called to discuss anemia  He has not noticed any bleeding. He states that he had a c-scope in march of this year and had two polyps removed by Dr. Collene Mares. I have records from 07/2012 with 3 polyps and no other abnormalities, f/u due in 5 years from that time.   He had an anal fissure with rectal bleeding earlier this year which has resolved.   He is non compliant with PO iron due to GI upset and constipation despite stool softeners.   Given his low ferritin,  negative GI workup, and PO intolerance I will set him up for IV feraheme.   Laroy Apple, MD [place] Resident, PGY-3 08/28/2014, 3:46 PM

## 2014-08-29 NOTE — Telephone Encounter (Signed)
Pt informed of infusion appt at short stay, Friday September 11 @ 9:00. Blade Scheff CMA

## 2014-09-02 ENCOUNTER — Telehealth: Payer: Self-pay | Admitting: Family Medicine

## 2014-09-02 ENCOUNTER — Ambulatory Visit
Admission: RE | Admit: 2014-09-02 | Discharge: 2014-09-02 | Disposition: A | Discharge status: Home / Self Care | Payer: Medicare Other | Source: Ambulatory Visit | Attending: Family Medicine | Admitting: Family Medicine

## 2014-09-02 DIAGNOSIS — R05 Cough: Secondary | ICD-10-CM

## 2014-09-02 DIAGNOSIS — R059 Cough, unspecified: Secondary | ICD-10-CM

## 2014-09-02 DIAGNOSIS — J189 Pneumonia, unspecified organism: Secondary | ICD-10-CM | POA: Insufficient documentation

## 2014-09-02 MED ORDER — LEVOFLOXACIN 500 MG PO TABS: 500.0000 mg | ORAL_TABLET | Freq: Every day | ORAL | Status: DC

## 2014-09-02 NOTE — Telephone Encounter (Signed)
Pt with CAP on XR, seen about 1 week ago. Discussed briefly on the phone and then the call was dropped without an answer on return call.   Considering DM2 and duration of symptoms will treat with Levaquin X 7 days.   Laroy Apple, MD [place] Resident, PGY-3 09/02/2014, 4:17 PM

## 2014-09-04 ENCOUNTER — Other Ambulatory Visit (HOSPITAL_COMMUNITY): Payer: Self-pay | Admitting: *Deleted

## 2014-09-05 ENCOUNTER — Encounter (HOSPITAL_COMMUNITY): Payer: Medicare Other

## 2014-09-09 ENCOUNTER — Other Ambulatory Visit: Payer: Self-pay | Admitting: Family Medicine

## 2014-09-09 NOTE — Telephone Encounter (Signed)
Pt needs appt if he needs appt on antibiotics.   Will ask staff to inform.   Laroy Apple, MD [place] Resident, PGY-3 09/09/2014, 1:35 PM

## 2014-10-21 ENCOUNTER — Other Ambulatory Visit: Payer: Self-pay | Admitting: Family Medicine

## 2014-11-24 ENCOUNTER — Other Ambulatory Visit: Payer: Self-pay | Admitting: Family Medicine

## 2014-12-12 ENCOUNTER — Other Ambulatory Visit: Payer: Self-pay | Admitting: Family Medicine

## 2014-12-20 ENCOUNTER — Other Ambulatory Visit: Payer: Self-pay | Admitting: Family Medicine

## 2015-01-23 ENCOUNTER — Ambulatory Visit
Admission: RE | Admit: 2015-01-23 | Discharge: 2015-01-23 | Disposition: A | Discharge status: Home / Self Care | Payer: Medicare Other | Source: Ambulatory Visit | Attending: Family Medicine | Admitting: Family Medicine

## 2015-01-23 ENCOUNTER — Ambulatory Visit (INDEPENDENT_AMBULATORY_CARE_PROVIDER_SITE_OTHER): Payer: Medicare Other | Admitting: Family Medicine

## 2015-01-23 ENCOUNTER — Encounter: Payer: Self-pay | Admitting: Family Medicine

## 2015-01-23 DIAGNOSIS — E1142 Type 2 diabetes mellitus with diabetic polyneuropathy: Secondary | ICD-10-CM

## 2015-01-23 DIAGNOSIS — M79672 Pain in left foot: Secondary | ICD-10-CM | POA: Diagnosis not present

## 2015-01-23 MED ORDER — NORTRIPTYLINE HCL 25 MG PO CAPS: 25.0000 mg | ORAL_CAPSULE | Freq: Every day | ORAL | Status: DC

## 2015-01-23 NOTE — Assessment & Plan Note (Addendum)
Most consistent with worsening diabetic neuropathy gabapentin previously helped a lot Will add nortriptyline today With acutely worsening pain at a focal site in his left foot will also use a plain film to help rule out bony abnormality

## 2015-01-23 NOTE — Patient Instructions (Signed)
Great to see you!   I think this is a sign of your diabetic nerve damage getting worse.  Lets try adding this medicine, Nortriptyline, 1 pill every night- its a better long term medicine  I'll let you know your x ray results on Monday.

## 2015-01-23 NOTE — Progress Notes (Addendum)
Patient ID: [name], male   DOB: 05/16/1951, 64 y.o.   MRN: [id number]   HPI  Patient presents today for foot pain  Patient explains that for the last 3 weeks she's had gradually worsening sharp shooting type pain that's worsened his left than his right. He states that it starts in his left anterior ankle goes up to his lateral calf and then shoots down to his foot.  He denies any feeling of cool feet or color change. He still able to walk but walking makes it worse. He states this is an electric type pain that similar to his neuropathy.  He's been taking gabapentin for quite some time with improvement of his previous neuropathy however this is much worse than baseline. He wonders if he needs an x-ray today. He denies any trauma.  He has tried Tylenol, Aspercreme, and heating pad with minimal improvement.  Smoking status noted ROS: Per HPI  Objective: BP 111/70 mmHg  Pulse 106  Temp(Src) 98.4 F (36.9 C) (Oral)  Ht 5\' 8"  (1.727 m)  Wt 199 lb (90.266 kg)  BMI 30.26 kg/m2 Gen: NAD, alert, cooperative with exam, wincing and jumping throughout exam due to shooting pains without even palpating his feet HEENT: NCAT Ext: Bilateral feet warm to the touch, 1+ DP pulses bilaterally, brisk cap refill in all 10 toes, sensation intact monofilament throughout feet, no lesions, tender to the touch in several areas throughout his feet with no bony tenderness to palpation on any site of either foot. Neuro: Alert and oriented, No gross deficits  Assessment and plan:  Diabetic neuropathy Most consistent with worsening diabetic neuropathy gabapentin previously helped a lot Will add nortriptyline today With acutely worsening pain at a focal site in his left foot will also use a plain film to help rule out bony abnormality     Orders Placed This Encounter  Procedures  . DG Foot Complete Left    Standing Status: Future     Number of Occurrences:      Standing Expiration Date: 03/23/2016      Order Specific Question:  Reason for Exam (SYMPTOM  OR DIAGNOSIS REQUIRED)    Answer:  acute L foot pain, likey neuropathy but eval for arthritic changes    Order Specific Question:  Preferred imaging location?    Answer:  [id number]    Meds ordered this encounter  Medications  . nortriptyline (PAMELOR) 25 MG capsule    Sig: Take 1 capsule (25 mg total) by mouth at bedtime.    Dispense:  30 capsule    Refill:  2

## 2015-01-26 ENCOUNTER — Telehealth: Payer: Self-pay | Admitting: Family Medicine

## 2015-01-26 NOTE — Telephone Encounter (Signed)
Foot xray norma, we should continue the current medication regimen.    Called and discussed, nortriptyline  is seeming to improve his pain.  Laroy Apple, MD [place] Resident, PGY-3 01/26/2015, 8:44 AM

## 2015-03-05 ENCOUNTER — Ambulatory Visit (INDEPENDENT_AMBULATORY_CARE_PROVIDER_SITE_OTHER): Payer: Medicare Other | Admitting: Family Medicine

## 2015-03-05 VITALS — BP 93/58 | HR 109 | Temp 98.7°F | Ht 68.0 in | Wt 194.0 lb

## 2015-03-05 DIAGNOSIS — E119 Type 2 diabetes mellitus without complications: Secondary | ICD-10-CM

## 2015-03-05 DIAGNOSIS — I10 Essential (primary) hypertension: Secondary | ICD-10-CM

## 2015-03-05 DIAGNOSIS — E1142 Type 2 diabetes mellitus with diabetic polyneuropathy: Secondary | ICD-10-CM | POA: Diagnosis not present

## 2015-03-05 LAB — CBC WITH DIFFERENTIAL/PLATELET
Basophils Absolute: 0 10*3/uL (ref 0.0–0.1)
Basophils Relative: 0 % (ref 0–1)
Eosinophils Absolute: 0.2 10*3/uL (ref 0.0–0.7)
Eosinophils Relative: 3 % (ref 0–5)
HCT: 27.1 % — ABNORMAL LOW (ref 39.0–52.0)
Hemoglobin: 8 g/dL — ABNORMAL LOW (ref 13.0–17.0)
Lymphocytes Relative: 36 % (ref 12–46)
Lymphs Abs: 2.7 10*3/uL (ref 0.7–4.0)
MCH: 18.8 pg — ABNORMAL LOW (ref 26.0–34.0)
MCHC: 29.5 g/dL — ABNORMAL LOW (ref 30.0–36.0)
MCV: 63.8 fL — ABNORMAL LOW (ref 78.0–100.0)
MPV: 8.2 fL — ABNORMAL LOW (ref 8.6–12.4)
Monocytes Absolute: 0.7 10*3/uL (ref 0.1–1.0)
Monocytes Relative: 9 % (ref 3–12)
Neutro Abs: 3.9 10*3/uL (ref 1.7–7.7)
Neutrophils Relative %: 52 % (ref 43–77)
Platelets: 397 10*3/uL (ref 150–400)
RBC: 4.25 MIL/uL (ref 4.22–5.81)
RDW: 19.4 % — ABNORMAL HIGH (ref 11.5–15.5)
WBC: 7.5 10*3/uL (ref 4.0–10.5)

## 2015-03-05 LAB — COMPREHENSIVE METABOLIC PANEL
ALT: <8 U/L (ref 0–53)
AST: 13 U/L (ref 0–37)
Albumin: 4 g/dL (ref 3.5–5.2)
Alkaline Phosphatase: 60 U/L (ref 39–117)
BUN: 29 mg/dL — ABNORMAL HIGH (ref 6–23)
CO2: 25 mEq/L (ref 19–32)
Calcium: 8.7 mg/dL (ref 8.4–10.5)
Chloride: 100 mEq/L (ref 96–112)
Creat: 1.43 mg/dL — ABNORMAL HIGH (ref 0.50–1.35)
Glucose, Bld: 110 mg/dL — ABNORMAL HIGH (ref 70–99)
Potassium: 4.2 mEq/L (ref 3.5–5.3)
Sodium: 136 mEq/L (ref 135–145)
Total Bilirubin: 0.3 mg/dL (ref 0.2–1.2)
Total Protein: 6.8 g/dL (ref 6.0–8.3)

## 2015-03-05 LAB — POCT GLYCOSYLATED HEMOGLOBIN (HGB A1C): Hemoglobin A1C: 7.3

## 2015-03-05 LAB — LDL CHOLESTEROL, DIRECT: Direct LDL: 29 mg/dL

## 2015-03-05 MED ORDER — PREGABALIN 75 MG PO CAPS: 75.0000 mg | ORAL_CAPSULE | Freq: Two times a day (BID) | ORAL | Status: DC

## 2015-03-05 NOTE — Progress Notes (Signed)
Patient ID: [name], male   DOB: 1951-04-27, 64 y.o.   MRN: [id number]   HPI  Patient presents today for follow up   HTN- taking meds,  no pain, dyspnea, palpitations, leg edema Does not watch his diet, prior to one month ago exercised regularly with inactive lifestyle by working in the yard.  Diabetes Again not watching diet, taking meds everyday No reported hypoglycemia  Foot pain, diabetic neuropathy Stable from last visit, not helped by nortriptyline Complains of left dorsal foot pain worse with walking, states that his left is really just the worst foot and states that he has bilateral foot and hand numbness and tingling type pain.  Urinary - normal flow, and his bladder well Previous rectal bleeding-no recent rectal bleeding, does have some rectal pain which is being worked on with him by his GI doctor.  Last month. Has felt weak and tired in general. He denies any dizziness or trouble thinking today.  Smoking status noted ROS: Per HPI  Objective: BP 93/58 mmHg  Pulse 109  Temp(Src) 98.7 F (37.1 C) (Oral)  Ht 5\' 8"  (1.727 m)  Wt 194 lb (87.998 kg)  BMI 29.50 kg/m2 Gen: NAD, alert, cooperative with exam HEENT: NCAT CV: RRR, good S1/S2, no murmur Resp: CTABL, no wheezes, non-labored Ext: No edema, warm Neuro: Alert and oriented, No gross deficits  Diabetic foot exam:  No lesions, sensation intact to monofilament throughout  Assessment and plan:  Diabetic neuropathy Not helped by nortriptyline Will trial Lyrica   Diabetes mellitus type II, controlled Will control with A1c of 7.2 today Continue metformin 1 g twice daily Previous urine protein cranium ratio normal    HYPERTENSION, BENIGN SYSTEMIC Low today, mild symptoms but mentating normally and walking easily Rechecked manual and it is accurate HR slowed to 96 prior to dc Labs, cbc Decrease lotensin to 20      Orders Placed This Encounter  Procedures  . CBC with Differential  .  Comprehensive metabolic panel  . LDL Cholesterol, Direct  . POCT HgB A1C    Meds ordered this encounter  Medications  . pregabalin (LYRICA) 75 MG capsule    Sig: Take 1 capsule (75 mg total) by mouth 2 (two) times daily.    Dispense:  60 capsule    Refill:  3

## 2015-03-05 NOTE — Assessment & Plan Note (Signed)
Will control with A1c of 7.2 today Continue metformin 1 g twice daily Previous urine protein cranium ratio normal

## 2015-03-05 NOTE — Assessment & Plan Note (Signed)
Not helped by nortriptyline Will trial Lyrica

## 2015-03-05 NOTE — Assessment & Plan Note (Signed)
Low today, mild symptoms but mentating normally and walking easily Rechecked manual and it is accurate HR slowed to 96 prior to dc Labs, cbc Decrease lotensin to 20

## 2015-03-05 NOTE — Patient Instructions (Signed)
Good to see you!  Lets see what these labs show and plan to see you back in 3 months.   Diet Recommendations for Diabetes   Starchy (carb) foods include: Bread, rice, pasta, potatoes, corn, crackers, bagels, muffins, all baked goods.   Protein foods include: Meat, fish, poultry, eggs, dairy foods, and beans such as pinto and kidney beans (beans also provide carbohydrate).   1. Eat at least 3 meals and 1-2 snacks per day. Never go more than 4-5 hours while awake without eating.  2. Limit starchy foods to TWO per meal and ONE per snack. ONE portion of a starchy  food is equal to the following:   - ONE slice of bread (or its equivalent, such as half of a hamburger bun).   - 1/2 cup of a "scoopable" starchy food such as potatoes or rice.   - 1 OUNCE (28 grams) of starchy snack foods such as crackers or pretzels (look on label).   - 15 grams of carbohydrate as shown on food label.  3. Both lunch and dinner should include a protein food, a carb food, and vegetables.   - Obtain twice as many veg's as protein or carbohydrate foods for both lunch and dinner.   - Try to keep frozen veg's on hand for a quick vegetable serving.     - Fresh or frozen veg's are best.  4. Breakfast should always include protein.

## 2015-03-06 ENCOUNTER — Other Ambulatory Visit: Payer: Self-pay | Admitting: Family Medicine

## 2015-03-06 DIAGNOSIS — D509 Iron deficiency anemia, unspecified: Secondary | ICD-10-CM

## 2015-03-06 MED ORDER — FERUMOXYTOL INJECTION 510 MG/17 ML: 1020.0000 mg | Freq: Once | INTRAVENOUS | Status: DC

## 2015-03-06 NOTE — Telephone Encounter (Signed)
Faxed order and rx to short stay. Will be looking out for appt date. Deseree Kennon Holter, CMA

## 2015-03-06 NOTE — Telephone Encounter (Signed)
Orderd and printed new rx and filled out short stay form.  Please fax to them and inquire when patient is scheduled.  Today would be much preferable  Thanks  LC

## 2015-03-06 NOTE — Telephone Encounter (Signed)
Patient with slowly drifting iron deficiency anemia. No signs of bleeding and he has had a C scope in July 2015 with Dr. Collene Mares. No Clear source of bleeding but he is not replacing his iron stores due to intolerance of PO iron.   Also declining renal function which is AKI vs CKD. He has stopped his Acei, it turns out months ago, and he is not using NSAIDs. This could possibly be associated with his anemia but is very mild and just as easily attributed to his HTN. I am discontinuing his HCTZ today.   I have talked to Dr. Erin Hearing and nursing and we will try to arrange Feraheme infusion at short stay. The patient understands this and is agreeable.   Laroy Apple, MD [place] Resident, PGY-3 03/06/2015, 9:33 AM

## 2015-03-10 ENCOUNTER — Other Ambulatory Visit (HOSPITAL_COMMUNITY): Payer: Self-pay | Admitting: *Deleted

## 2015-03-10 ENCOUNTER — Telehealth: Payer: Self-pay | Admitting: *Deleted

## 2015-03-10 NOTE — Telephone Encounter (Signed)
Pt informed of appt time. Lameeka Schleifer Kennon Holter, CMA

## 2015-03-11 ENCOUNTER — Ambulatory Visit (HOSPITAL_COMMUNITY)
Admission: RE | Admit: 2015-03-11 | Discharge: 2015-03-11 | Disposition: A | Discharge status: Home / Self Care | Payer: Medicare Other | Source: Ambulatory Visit | Attending: Family Medicine | Admitting: Family Medicine

## 2015-03-11 DIAGNOSIS — D509 Iron deficiency anemia, unspecified: Secondary | ICD-10-CM | POA: Insufficient documentation

## 2015-03-11 MED ORDER — FERUMOXYTOL INJECTION 510 MG/17 ML
510.0000 mg | Freq: Once | INTRAVENOUS | Status: AC
  Administered 2015-03-11: 510 mg via INTRAVENOUS
  Filled 2015-03-11: qty 17

## 2015-03-11 NOTE — Discharge Instructions (Signed)

## 2015-03-16 ENCOUNTER — Other Ambulatory Visit: Payer: Self-pay | Admitting: Family Medicine

## 2015-03-31 ENCOUNTER — Ambulatory Visit (INDEPENDENT_AMBULATORY_CARE_PROVIDER_SITE_OTHER): Payer: Medicare Other | Admitting: Family Medicine

## 2015-03-31 ENCOUNTER — Encounter: Payer: Self-pay | Admitting: Family Medicine

## 2015-03-31 VITALS — BP 127/85 | HR 90 | Temp 97.8°F | Ht 68.0 in | Wt 195.1 lb

## 2015-03-31 DIAGNOSIS — I10 Essential (primary) hypertension: Secondary | ICD-10-CM | POA: Diagnosis not present

## 2015-03-31 DIAGNOSIS — D509 Iron deficiency anemia, unspecified: Secondary | ICD-10-CM

## 2015-03-31 DIAGNOSIS — M79672 Pain in left foot: Secondary | ICD-10-CM

## 2015-03-31 LAB — BASIC METABOLIC PANEL
BUN: 15 mg/dL (ref 6–23)
CO2: 22 mEq/L (ref 19–32)
Calcium: 8.7 mg/dL (ref 8.4–10.5)
Chloride: 106 mEq/L (ref 96–112)
Creat: 0.94 mg/dL (ref 0.50–1.35)
Glucose, Bld: 147 mg/dL — ABNORMAL HIGH (ref 70–99)
Potassium: 4 mEq/L (ref 3.5–5.3)
Sodium: 140 mEq/L (ref 135–145)

## 2015-03-31 MED ORDER — TRAMADOL HCL 50 MG PO TABS: 50.0000 mg | ORAL_TABLET | Freq: Three times a day (TID) | ORAL | Status: DC | PRN

## 2015-03-31 MED ORDER — BENAZEPRIL HCL 5 MG PO TABS: 5.0000 mg | ORAL_TABLET | Freq: Every day | ORAL | Status: DC

## 2015-03-31 MED ORDER — GABAPENTIN 800 MG PO TABS: 800.0000 mg | ORAL_TABLET | Freq: Three times a day (TID) | ORAL | Status: DC

## 2015-03-31 NOTE — Assessment & Plan Note (Signed)
S/p ferraheme X 1 dose Re-check cbc C scope by Dr. Collene Mares in 2013 with repeat needed in 5 years (2018)

## 2015-03-31 NOTE — Assessment & Plan Note (Addendum)
Previously felt to be due to diabetic neuropathy, however he has focal pain on L plantar surface not typical of diabetic neuropathy Has tried and failed  Nortriptyline, Lyrica, Effexor  Previously well-controlled  Diabetic neuropathy on gabapentin, restart previous dose of gabapentin  Consider anemia related neuropathy as well Refer to sports medicine for further evaluation, concern for anatomic lesion that might be elucidated with musculoskeletal ultrasound

## 2015-03-31 NOTE — Assessment & Plan Note (Addendum)
Previously low Now off of everything but nebivolol Restart low dose ace, continue nebivolol

## 2015-03-31 NOTE — Patient Instructions (Signed)
Great to see you!  You should hear from sports medicine for an appointment this week  Re-start gabapentin, I sent a prescription in  Continue nebivolol I have re-started benazepril at a low dose ( 5 mg once daily)  Please follow up in 1 month

## 2015-03-31 NOTE — Progress Notes (Addendum)
Patient ID: [name], male   DOB: April 10, 1951, 64 y.o.   MRN: [id number]   HPI  Patient presents today for  Follow-up diabetic neuropathy and hypertension  Hypertension No chest pain, dyspnea, palpitations, headache Only taking Lotensin now  unable to exercise due to foot pain    diabetic neuropathy Previously well controlled with gabapentin.  developed left foot greater than right foot diabetic neuropathy type pain about 4 months ago.  Smoking status noted ROS: Per HPI  Objective: BP 127/85 mmHg  Pulse 90  Temp(Src) 97.8 F (36.6 C) (Oral)  Ht 5\' 8"  (1.727 m)  Wt 195 lb 1.6 oz (88.497 kg)  BMI 29.67 kg/m2 Gen: NAD, alert, cooperative with exam HEENT: NCAT CV: RRR, good S1/S2, no murmur Resp: CTABL, no wheezes, non-labored Ext: No edema, warm Neuro: Alert and oriented, No gross deficits  Diabetic foot exam 1+ DP pulses BL, monofilament sensation intact throughout No lesions  Assessment and plan:  Left foot pain Previously felt to be due to diabetic neuropathy, however he has focal pain on L plantar surface not typical of diabetic neuropathy Has tried and failed  Nortriptyline, Lyrica, Effexor  Previously well-controlled  Diabetic neuropathy on gabapentin, restart previous dose of gabapentin  Consider anemia related neuropathy as well Refer to sports medicine for further evaluation, concern for anatomic lesion that might be elucidated with musculoskeletal ultrasound    HYPERTENSION, BENIGN SYSTEMIC Previously low Now off of everything but nebivolol Restart low dose ace, continue nebivolol    Iron deficiency anemia S/p ferraheme X 1 dose Re-check cbc C scope by Dr. Collene Mares in 2013 with repeat needed in 5 years (2018)     Orders Placed This Encounter  Procedures  . CBC with Differential  . Basic Metabolic Panel  . Ambulatory referral to Sports Medicine    Referral Priority:  Routine    Referral Type:  Consultation    Number of Visits Requested:  1      Meds ordered this encounter  Medications  . gabapentin (NEURONTIN) 800 MG tablet    Sig: Take 1 tablet (800 mg total) by mouth 3 (three) times daily.    Dispense:  90 tablet    Refill:  3  . benazepril (LOTENSIN) 5 MG tablet    Sig: Take 1 tablet (5 mg total) by mouth daily.    Dispense:  30 tablet    Refill:  5  . traMADol (ULTRAM) 50 MG tablet    Sig: Take 1 tablet (50 mg total) by mouth every 8 (eight) hours as needed.    Dispense:  30 tablet    Refill:  0

## 2015-04-01 ENCOUNTER — Encounter: Payer: Self-pay | Admitting: Family Medicine

## 2015-04-01 LAB — CBC WITH DIFFERENTIAL/PLATELET
Basophils Absolute: 0 10*3/uL (ref 0.0–0.1)
Basophils Relative: 1 % (ref 0–1)
Eosinophils Absolute: 0.2 10*3/uL (ref 0.0–0.7)
Eosinophils Relative: 5 % (ref 0–5)
HCT: 30.5 % — ABNORMAL LOW (ref 39.0–52.0)
Hemoglobin: 9.1 g/dL — ABNORMAL LOW (ref 13.0–17.0)
Lymphocytes Relative: 36 % (ref 12–46)
Lymphs Abs: 1.7 10*3/uL (ref 0.7–4.0)
MCH: 20.1 pg — ABNORMAL LOW (ref 26.0–34.0)
MCHC: 29.8 g/dL — ABNORMAL LOW (ref 30.0–36.0)
MCV: 67.5 fL — ABNORMAL LOW (ref 78.0–100.0)
MPV: 8.4 fL — ABNORMAL LOW (ref 8.6–12.4)
Monocytes Absolute: 0.8 10*3/uL (ref 0.1–1.0)
Monocytes Relative: 16 % — ABNORMAL HIGH (ref 3–12)
Neutro Abs: 2 10*3/uL (ref 1.7–7.7)
Neutrophils Relative %: 42 % — ABNORMAL LOW (ref 43–77)
Platelets: 384 10*3/uL (ref 150–400)
RBC: 4.52 MIL/uL (ref 4.22–5.81)
RDW: 26.5 % — ABNORMAL HIGH (ref 11.5–15.5)
WBC: 4.7 10*3/uL (ref 4.0–10.5)

## 2015-04-10 ENCOUNTER — Other Ambulatory Visit: Payer: Self-pay | Admitting: Family Medicine

## 2015-04-15 ENCOUNTER — Other Ambulatory Visit: Payer: Self-pay | Admitting: Family Medicine

## 2015-04-15 MED ORDER — TRAMADOL HCL 50 MG PO TABS: 50.0000 mg | ORAL_TABLET | Freq: Three times a day (TID) | ORAL | Status: DC | PRN

## 2015-04-21 ENCOUNTER — Encounter: Payer: Self-pay | Admitting: Sports Medicine

## 2015-04-21 ENCOUNTER — Encounter: Payer: Medicare Other | Admitting: Sports Medicine

## 2015-04-22 NOTE — Progress Notes (Signed)
This encounter was created in error - please disregard.

## 2015-06-10 ENCOUNTER — Other Ambulatory Visit: Payer: Self-pay | Admitting: *Deleted

## 2015-06-10 MED ORDER — TRAMADOL HCL 50 MG PO TABS: 50.0000 mg | ORAL_TABLET | Freq: Three times a day (TID) | ORAL | Status: DC | PRN

## 2015-06-10 NOTE — Telephone Encounter (Signed)
Red Team: Covering for Dr. Wendi Snipes. Please call pt to let him know he can pick up tramadol Rx at the front desk at his convenience. Thanks! --CMS

## 2015-06-15 ENCOUNTER — Other Ambulatory Visit: Payer: Self-pay | Admitting: *Deleted

## 2015-06-16 MED ORDER — TRAMADOL HCL 50 MG PO TABS: 50.0000 mg | ORAL_TABLET | Freq: Three times a day (TID) | ORAL | Status: DC | PRN

## 2015-06-16 NOTE — Telephone Encounter (Signed)
Called in tramadol, will ask nursing to inform.   Laroy Apple, MD [place] Resident, PGY-3 06/16/2015, 8:53 AM

## 2015-06-16 NOTE — Telephone Encounter (Signed)
Pt informed. [name]  

## 2015-06-16 NOTE — Addendum Note (Signed)
Addended by: Timmothy Euler on: 06/16/2015 08:54 AM   Modules accepted: Orders

## 2015-06-18 ENCOUNTER — Ambulatory Visit: Payer: Medicare Other | Admitting: Family Medicine

## 2015-07-07 ENCOUNTER — Encounter (HOSPITAL_COMMUNITY): Payer: Self-pay | Admitting: Cardiology

## 2015-07-07 ENCOUNTER — Emergency Department (HOSPITAL_COMMUNITY)
Admission: EM | Admit: 2015-07-07 | Discharge: 2015-07-07 | Disposition: A | Discharge status: Home / Self Care | Payer: Medicare Other | Attending: Emergency Medicine | Admitting: Emergency Medicine

## 2015-07-07 DIAGNOSIS — G8929 Other chronic pain: Secondary | ICD-10-CM | POA: Insufficient documentation

## 2015-07-07 DIAGNOSIS — I10 Essential (primary) hypertension: Secondary | ICD-10-CM | POA: Insufficient documentation

## 2015-07-07 DIAGNOSIS — K219 Gastro-esophageal reflux disease without esophagitis: Secondary | ICD-10-CM | POA: Insufficient documentation

## 2015-07-07 DIAGNOSIS — Z720 Tobacco use: Secondary | ICD-10-CM | POA: Insufficient documentation

## 2015-07-07 DIAGNOSIS — M1990 Unspecified osteoarthritis, unspecified site: Secondary | ICD-10-CM | POA: Diagnosis not present

## 2015-07-07 DIAGNOSIS — Z79899 Other long term (current) drug therapy: Secondary | ICD-10-CM | POA: Insufficient documentation

## 2015-07-07 DIAGNOSIS — R040 Epistaxis: Secondary | ICD-10-CM | POA: Diagnosis not present

## 2015-07-07 DIAGNOSIS — Z7902 Long term (current) use of antithrombotics/antiplatelets: Secondary | ICD-10-CM | POA: Diagnosis not present

## 2015-07-07 DIAGNOSIS — Z7952 Long term (current) use of systemic steroids: Secondary | ICD-10-CM | POA: Diagnosis not present

## 2015-07-07 DIAGNOSIS — E119 Type 2 diabetes mellitus without complications: Secondary | ICD-10-CM | POA: Insufficient documentation

## 2015-07-07 DIAGNOSIS — R030 Elevated blood-pressure reading, without diagnosis of hypertension: Secondary | ICD-10-CM | POA: Diagnosis not present

## 2015-07-07 DIAGNOSIS — Z8781 Personal history of (healed) traumatic fracture: Secondary | ICD-10-CM | POA: Diagnosis not present

## 2015-07-07 MED ORDER — NEBIVOLOL HCL 10 MG PO TABS
20.0000 mg | ORAL_TABLET | Freq: Once | ORAL | Status: AC
  Administered 2015-07-07: 20 mg via ORAL
  Filled 2015-07-07: qty 2

## 2015-07-07 MED ORDER — BENAZEPRIL HCL 5 MG PO TABS
5.0000 mg | ORAL_TABLET | Freq: Once | ORAL | Status: AC
  Administered 2015-07-07: 5 mg via ORAL
  Filled 2015-07-07: qty 1

## 2015-07-07 NOTE — Discharge Instructions (Signed)

## 2015-07-07 NOTE — ED Notes (Signed)
Pt to department via EMS- pt reports persistent nosebleeds that started last night. Pt reports a hx of HTN. Has not taken his medication this morning. Bp-190/90 Hr-80 Cbg-124.

## 2015-07-07 NOTE — ED Notes (Signed)
EDP at the bedside.  ?

## 2015-07-07 NOTE — ED Provider Notes (Signed)
CSN: [id number]     Arrival date & time 07/07/15  7001 History   First MD Initiated Contact with Patient 07/07/15 0737     Chief Complaint  Patient presents with  . Epistaxis     (Consider location/radiation/quality/duration/timing/severity/associated sxs/prior Treatment) HPI Patient start having nasal bleeding yesterday. He reports as been more or less a constant drip. This is the patient's first episode of having had nosebleed. He reports there have been some clots as well. He states he is otherwise been well. He reports some URI symptoms over a week ago which she thought were resolved. No associated lightheadedness or dizziness. Patient medication list does include Plavix and pletal. Patient states he takes his anti-hypertensive medications in the morning. He reports he has not taken his medications for today yet. He states he has otherwise been well without recent illness or other complications of his medical problems. Past Medical History  Diagnosis Date  . Diabetes mellitus   . GERD (gastroesophageal reflux disease)   . Hyperlipidemia   . Hypertension   . Arthritis     OA  . Cervical radiculopathy     Dr. Vertell Limber neurosurgery  . PAD (peripheral artery disease)   . Tobacco user   . Chronic lower back pain   . Toe fracture, right 05/09/2011   Past Surgical History  Procedure Laterality Date  . Anterior cervical decomp/discectomy fusion  03/08/12    C6-7  . Vascular surgery  ~ 2007    Stent SFA   . Back surgery  1996  . Lumbar disc surgery  1996    "lower"  . Inguinal hernia repair  1990's    right  . Anterior cervical decomp/discectomy fusion  03/08/2012    Procedure: ANTERIOR CERVICAL DECOMPRESSION/DISCECTOMY FUSION 1 LEVEL/HARDWARE REMOVAL;  Surgeon: Erline Levine, MD;  Location: [facility];  Service: Neurosurgery;  Laterality: N/A;  revison of C5-7 anterior cervical decompression with fusion with Cervical Five-Thoracic One anterior cervical decompression with fusion with  interbody prothesis plating and bonegraft  . Colonoscopy w/ biopsies and polypectomy  08/17/2012    f/u 5 years, 4 polyps, no high grade dysplasia or malignancy, tubular adenoma, hyperplastic polyops  . Esophagogastroduodenoscopy  08/17/2012    normal esophagus and GEJ, diffuse gastritis with erythema- no malignancy, reactive gastropathy  with focal intestinal metaplasia   Family History  Problem Relation Age of Onset  . Cancer Mother     colon Cancer  . Heart disease Father   . Hypertension Father    History  Substance Use Topics  . Smoking status: Current Some Day Smoker    Types: Cigars  . Smokeless tobacco: Never Used     Comment: 03/08/12 "been on electronic cigarettes since first of this month", cigar "every now and then"  . Alcohol Use: 7.8 oz/week    7 Cans of beer, 6 Shots of liquor per week    Review of Systems 10 Systems reviewed and are negative for acute change except as noted in the HPI.    Allergies  Glipizide  Home Medications   Prior to Admission medications   Medication Sig Start Date End Date Taking? Authorizing Provider  aspirin 325 MG tablet Take 650 mg by mouth every morning.     Historical Provider, MD  benazepril (LOTENSIN) 5 MG tablet Take 1 tablet (5 mg total) by mouth daily. 03/31/15   Timmothy Euler, MD  cilostazol (PLETAL) 100 MG tablet Take 1 tablet (100 mg total) by mouth 2 (two) times daily. 10/21/14  Timmothy Euler, MD  clopidogrel (PLAVIX) 75 MG tablet TAKE 1 TABLET EVERY DAY 12/20/14   Timmothy Euler, MD  gabapentin (NEURONTIN) 800 MG tablet Take 1 tablet (800 mg total) by mouth 3 (three) times daily. 03/31/15   Timmothy Euler, MD  hydrocortisone (PROCTOSOL HC) 2.5 % rectal cream Place 1 application rectally 2 (two) times daily. 04/23/14   Timmothy Euler, MD  metFORMIN (GLUCOPHAGE) 1000 MG tablet Take 1 tablet (1,000 mg total) by mouth 2 (two) times daily with a meal. 02/27/14   Timmothy Euler, MD  Nebivolol HCl (BYSTOLIC) 20 MG  TABS Take 1 tablet (20 mg total) by mouth daily. 10/21/13   Timmothy Euler, MD  pantoprazole (PROTONIX) 40 MG tablet TAKE 1 TABLET BY MOUTH EVERY DAY 04/10/15   Timmothy Euler, MD  traMADol (ULTRAM) 50 MG tablet Take 1 tablet (50 mg total) by mouth every 8 (eight) hours as needed. 06/16/15   Timmothy Euler, MD  VYTORIN 10-40 MG per tablet TAKE 1 TABLET BY MOUTH EVERY MORNING. 06/17/14   Timmothy Euler, MD   BP 151/82 mmHg  Pulse 79  Temp(Src) 97.8 F (36.6 C) (Oral)  Resp 20  SpO2 99% Physical Exam  Constitutional: He is oriented to person, place, and time. He appears well-developed and well-nourished. No distress.  HENT:  Head: Normocephalic and atraumatic.  Left naris has clots in it at this time. There is a slow trickle of blood. Posterior oropharynx is widely patent. There is no hanging clot but there is a small amount of blood-streaked post nasal drip.  Eyes: EOM are normal. Pupils are equal, round, and reactive to light.  Neck: Neck supple.  Cardiovascular: Normal rate, regular rhythm, normal heart sounds and intact distal pulses.   Pulmonary/Chest: Effort normal and breath sounds normal.  Musculoskeletal: Normal range of motion.  Neurological: He is alert and oriented to person, place, and time. No cranial nerve deficit. He exhibits normal muscle tone. Coordination normal.  Skin: Skin is warm and dry.  Psychiatric: He has a normal mood and affect.    ED Course  EPISTAXIS MANAGEMENT Performed by: Charlesetta Shanks Authorized by: Charlesetta Shanks Treatment site: left anterior Repair method: merocel sponge Post-procedure assessment: bleeding stopped Treatment complexity: simple Patient tolerance: Patient tolerated the procedure well with no immediate complications Comments: Neo-Synephrine spray was used for clot removal. Once this was applied the patient was able to cough and blowing clear clot. With nasal passage clear and no active bleeding present, a rapid Rhino sponge  was placed without difficulty.. Patient was observed for another 20 minutes without any rebleeding.   (including critical care time)  Labs Review Labs Reviewed - No data to display  Imaging Review No results found.   EKG Interpretation None     Recheck: 935 patient has had no rebleeding. He is comfortable with his packing in place. Vital signs are stable. MDM   Final diagnoses:  Epistaxis   Patient with no prior history of epistaxis. He is however on Plavix and Plendil. He reports compliance with his blood pressure medications. There is no associated pain. Nasal clots cleared after demonstration of nasal Neo-Synephrine solution. A packing was placed and with observation no rebleeding. She is counseled on need for packing removal and follow-up.    Charlesetta Shanks, MD 07/07/15 [id number]

## 2015-07-08 ENCOUNTER — Other Ambulatory Visit: Payer: Self-pay | Admitting: *Deleted

## 2015-07-08 MED ORDER — EZETIMIBE-SIMVASTATIN 10-40 MG PO TABS: 1.0000 | ORAL_TABLET | Freq: Every morning | ORAL | Status: DC

## 2015-07-09 ENCOUNTER — Emergency Department (HOSPITAL_COMMUNITY)
Admission: EM | Admit: 2015-07-09 | Discharge: 2015-07-09 | Discharge status: Against Medical Advice | Payer: Medicare Other | Attending: Emergency Medicine | Admitting: Emergency Medicine

## 2015-07-09 ENCOUNTER — Encounter (HOSPITAL_COMMUNITY): Payer: Self-pay | Admitting: Neurology

## 2015-07-09 DIAGNOSIS — E119 Type 2 diabetes mellitus without complications: Secondary | ICD-10-CM | POA: Diagnosis not present

## 2015-07-09 DIAGNOSIS — I10 Essential (primary) hypertension: Secondary | ICD-10-CM | POA: Insufficient documentation

## 2015-07-09 DIAGNOSIS — Z720 Tobacco use: Secondary | ICD-10-CM | POA: Insufficient documentation

## 2015-07-09 DIAGNOSIS — G8929 Other chronic pain: Secondary | ICD-10-CM | POA: Insufficient documentation

## 2015-07-09 DIAGNOSIS — Z4800 Encounter for change or removal of nonsurgical wound dressing: Secondary | ICD-10-CM | POA: Insufficient documentation

## 2015-07-09 NOTE — ED Notes (Signed)
Pt reports here to have rhino rocket removed from left side of nose that was placed 2 days ago for nose bleed. Bleeding has stopped

## 2015-07-09 NOTE — ED Notes (Signed)
Pt seen walking out of department. Pt ambulated with no distress per nurse who saw the pt leaving. Pt not found in waiting room.

## 2015-07-14 ENCOUNTER — Ambulatory Visit (INDEPENDENT_AMBULATORY_CARE_PROVIDER_SITE_OTHER): Payer: Medicare Other | Admitting: Family Medicine

## 2015-07-14 ENCOUNTER — Encounter: Payer: Self-pay | Admitting: Family Medicine

## 2015-07-14 VITALS — BP 116/62 | HR 88 | Temp 98.6°F | Ht 67.0 in | Wt 197.0 lb

## 2015-07-14 DIAGNOSIS — I739 Peripheral vascular disease, unspecified: Secondary | ICD-10-CM

## 2015-07-14 DIAGNOSIS — E119 Type 2 diabetes mellitus without complications: Secondary | ICD-10-CM

## 2015-07-14 LAB — POCT GLYCOSYLATED HEMOGLOBIN (HGB A1C): Hemoglobin A1C: 6.5

## 2015-07-14 NOTE — Progress Notes (Signed)
   HPI  CC: bilateral leg pain Patient presented w/ complaints of bilateral leg pain. Pain is described at sharp in nature, w/ achiness that occurs w/ prolonged walking. Pain is worse in the Lt leg than the Rt. He endorses some occasional burning in his legs as well. This discomfort has kept him from being as active as he'd like to be. He has been taking his gabapentin for this at a reduced dose than what we have prescribed -- he feels the higher dose can make him feel nauseated. Other than that change, he endorses good med compliance. Medications have not helped this pain much.  ROS: patient denies weakness, numbness, bowel/bladder incontinence, fevers, chills, weight loss, SOB, CP, or rash.  Review of Systems   See HPI for ROS. All other systems reviewed and are negative.  Past medical history and social history reviewed and updated in the EMR as appropriate.  Objective: BP 116/62 mmHg  Pulse 88  Temp(Src) 98.6 F (37 C) (Oral)  Ht 5\' 7"  (1.702 m)  Wt 197 lb (89.359 kg)  BMI 30.85 kg/m2 Gen: NAD, alert, cooperative, and pleasant. HEENT: NCAT, EOMI, PERRL CV: RRR, no murmur Resp: CTAB, no wheezes, non-labored Abd: SNTND, BS present, no guarding or organomegaly Ext: No edema, warm, strength 5/5, sensation at baseline, FROM, pulses unable to be located bilaterally. Neuro: Alert and oriented, Speech clear, No gross deficits  Assessment and plan:  Claudication of both lower extremities Patients signs and symptoms most consistent w/ claudication. Patient is at risk for PVD w/ his DM2 and HLD. Lack of pulses on exam concerning for progressive PVD vs. Occusion. I feel severe PVD is most likely at this time. - Arterial dopplers ordered - will follow.  Diabetes mellitus type II, controlled A1c ordered today -- 6.5 - hold course    Orders Placed This Encounter  Procedures  . POCT HgB A1C    Elberta Leatherwood, MD,MS,  PGY2 07/15/2015 9:55 PM

## 2015-07-14 NOTE — Patient Instructions (Signed)
It was a pleasure seeing you today in our clinic. Today we discussed your diabetes and leg pain. Here is the treatment plan we have discussed and agreed upon together:   - At this time I would like to continue your typical metformin dosing. - I have ordered for you to have an ultrasound of your legs. - I would like to have you schedule an appointment to see me again after you have this study.

## 2015-07-15 ENCOUNTER — Ambulatory Visit (HOSPITAL_COMMUNITY)
Admission: RE | Admit: 2015-07-15 | Discharge: 2015-07-15 | Disposition: A | Discharge status: Home / Self Care | Payer: Medicare Other | Source: Ambulatory Visit | Attending: Family Medicine | Admitting: Family Medicine

## 2015-07-15 DIAGNOSIS — I739 Peripheral vascular disease, unspecified: Secondary | ICD-10-CM | POA: Insufficient documentation

## 2015-07-15 HISTORY — DX: Peripheral vascular disease, unspecified: I73.9

## 2015-07-15 NOTE — Progress Notes (Signed)
*  PRELIMINARY RESULTS* Vascular Ultrasound Lower Extremity Arterial Duplex has been completed.    Right Right common femoral artery= 50-74% stenosis Right profunda femoral artery= 30-49% stenosis Right femoral artery appears to be occluded with recanalization at the popliteal artery. Right dorsalis pedis artery is patent with severely dampened monophasic flow.  Left Left profunda femoral artery= 50-74% stenosis Left femoral artery appears to be occluded with recanalization at the distal femoral artery. Right dorsalis pedis artery is patent with severely dampened monophasic flow.  Preliminary results discussed with Dr. Alease Frame.  07/15/2015 11:44 AM [name], RVT, RDCS, RDMS

## 2015-07-15 NOTE — Assessment & Plan Note (Addendum)
A1c ordered today -- 6.5 - hold course

## 2015-07-15 NOTE — Assessment & Plan Note (Signed)
Patients signs and symptoms most consistent w/ claudication. Patient is at risk for PVD w/ his DM2 and HLD. Lack of pulses on exam concerning for progressive PVD vs. Occusion. I feel severe PVD is most likely at this time. - Arterial dopplers ordered - will follow.

## 2015-07-16 ENCOUNTER — Ambulatory Visit (INDEPENDENT_AMBULATORY_CARE_PROVIDER_SITE_OTHER): Payer: Medicare Other | Admitting: Family Medicine

## 2015-07-16 ENCOUNTER — Encounter: Payer: Self-pay | Admitting: Family Medicine

## 2015-07-16 VITALS — BP 135/63 | HR 94 | Temp 98.3°F | Wt 200.0 lb

## 2015-07-16 DIAGNOSIS — I739 Peripheral vascular disease, unspecified: Secondary | ICD-10-CM

## 2015-07-16 NOTE — Patient Instructions (Signed)
I have referred you to vascular surgery as I am concerned that you are not getting enough blood flow to your legs and that is what is causing all your pain. Please follow up with that appointment. Please seek medical attention if the pain in your legs/feet acute worsens, your foot becomes completely numb, or the color changes drastically.  Peripheral Vascular Disease Peripheral Vascular Disease (PVD), also called Peripheral Arterial Disease (PAD), is a circulation problem caused by cholesterol (atherosclerotic plaque) deposits in the arteries. PVD commonly occurs in the lower extremities (legs) but it can occur in other areas of the body, such as your arms. The cholesterol buildup in the arteries reduces blood flow which can cause pain and other serious problems. The presence of PVD can place a person at risk for Coronary Artery Disease (CAD).  CAUSES  Causes of PVD can be many. It is usually associated with more than one risk factor such as:   High Cholesterol.  Smoking.  Diabetes.  Lack of exercise or inactivity.  High blood pressure (hypertension).  Obesity.  Family history. SYMPTOMS   When the lower extremities are affected, patients with PVD may experience:  Leg pain with exertion or physical activity. This is called INTERMITTENT CLAUDICATION. This may present as cramping or numbness with physical activity. The location of the pain is associated with the level of blockage. For example, blockage at the abdominal level (distal abdominal aorta) may result in buttock or hip pain. Lower leg arterial blockage may result in calf pain.  As PVD becomes more severe, pain can develop with less physical activity.  In people with severe PVD, leg pain may occur at rest.  Other PVD signs and symptoms:  Leg numbness or weakness.  Coldness in the affected leg or foot, especially when compared to the other leg.  A change in leg color.  Patients with significant PVD are more prone to ulcers  or sores on toes, feet or legs. These may take longer to heal or may reoccur. The ulcers or sores can become infected.  If signs and symptoms of PVD are ignored, gangrene may occur. This can result in the loss of toes or loss of an entire limb.  Not all leg pain is related to PVD. Other medical conditions can cause leg pain such as:  Blood clots (embolism) or Deep Vein Thrombosis.  Inflammation of the blood vessels (vasculitis).  Spinal stenosis. DIAGNOSIS  Diagnosis of PVD can involve several different types of tests. These can include:  Pulse Volume Recording Method (PVR). This test is simple, painless and does not involve the use of X-rays. PVR involves measuring and comparing the blood pressure in the arms and legs. An ABI (Ankle-Brachial Index) is calculated. The normal ratio of blood pressures is 1. As this number becomes smaller, it indicates more severe disease.  < 0.95 - indicates significant narrowing in one or more leg vessels.  <0.8 - there will usually be pain in the foot, leg or buttock with exercise.  <0.4 - will usually have pain in the legs at rest.  <0.25 - usually indicates limb threatening PVD.  Doppler detection of pulses in the legs. This test is painless and checks to see if you have a pulses in your legs/feet.  A dye or contrast material (a substance that highlights the blood vessels so they show up on x-ray) may be given to help your caregiver better see the arteries for the following tests. The dye is eliminated from your body by the  kidney's. Your caregiver may order blood work to check your kidney function and other laboratory values before the following tests are performed:  Magnetic Resonance Angiography (MRA). An MRA is a picture study of the blood vessels and arteries. The MRA machine uses a large magnet to produce images of the blood vessels.  Computed Tomography Angiography (CTA). A CTA is a specialized x-ray that looks at how the blood flows in your  blood vessels. An IV may be inserted into your arm so contrast dye can be injected.  Angiogram. Is a procedure that uses x-rays to look at your blood vessels. This procedure is minimally invasive, meaning a small incision (cut) is made in your groin. A small tube (catheter) is then inserted into the artery of your groin. The catheter is guided to the blood vessel or artery your caregiver wants to examine. Contrast dye is injected into the catheter. X-rays are then taken of the blood vessel or artery. After the images are obtained, the catheter is taken out. TREATMENT  Treatment of PVD involves many interventions which may include:  Lifestyle changes:  Quitting smoking.  Exercise.  Following a low fat, low cholesterol diet.  Control of diabetes.  Foot care is very important to the PVD patient. Good foot care can help prevent infection.  Medication:  Cholesterol-lowering medicine.  Blood pressure medicine.  Anti-platelet drugs.  Certain medicines may reduce symptoms of Intermittent Claudication.  Interventional/Surgical options:  Angioplasty. An Angioplasty is a procedure that inflates a balloon in the blocked artery. This opens the blocked artery to improve blood flow.  Stent Implant. A wire mesh tube (stent) is placed in the artery. The stent expands and stays in place, allowing the artery to remain open.  Peripheral Bypass Surgery. This is a surgical procedure that reroutes the blood around a blocked artery to help improve blood flow. This type of procedure may be performed if Angioplasty or stent implants are not an option. SEEK IMMEDIATE MEDICAL CARE IF:   You develop pain or numbness in your arms or legs.  Your arm or leg turns cold, becomes blue in color.  You develop redness, warmth, swelling and pain in your arms or legs. MAKE SURE YOU:   Understand these instructions.  Will watch your condition.  Will get help right away if you are not doing well or get  worse. Document Released: 01/19/2005 Document Revised: 03/05/2012 Document Reviewed: 12/16/2008 [facility] Patient Information 2015 [place], [place]. This information is not intended to replace advice given to you by your health care provider. Make sure you discuss any questions you have with your health care provider.

## 2015-07-16 NOTE — Assessment & Plan Note (Addendum)
Patient's symptoms and LE arterial duplex c/w with severe PAD bilaterally. Unsure about previous history of revascularization procedures however the occlusion of both the right and left femoral arteries with recanalization suggests collateral formation due to progressively worsening stenosis over years.  Risk factors include DM, HTN, and smoking. Pain has not acutely worsened over the last few days, this seems to have been progressively worsening over the last several years with acute worsening over the last 6 months. Given pain and physical exam findings are stable per patient (L foot has been cooler to touch than the R x 6 months per his account), I'm less concerned with ischemic limbs. - Referred urgently to vascular surgery -Discussed smoking cessation  - Continue Pletal for now.  - RTC precautions discussed: Worsening pain or numbness, change in color, or the development if redness, warmth, swelling and pain in his legs. Pt voiced understanding.

## 2015-07-16 NOTE — Progress Notes (Signed)
Patient ID: [name], male   DOB: 1951-01-25, 64 y.o.   MRN: [id number]    Subjective: CC:f/u LE pain HPI: Patient is a 64 y.o. male with a past medical history of PVD, HTN, DM, diabetic neuropathy, OA presenting to clinic today for bilateral leg pain. .  Patient presented w/ complaints of bilateral leg pain: per his report present for years, but progressively worsened over the last 6 months. There is some achiness at baseline. Pain is described at sharp in nature  w/ prolonged walking. Pain is worse in the L leg than the R. He endorses some occasional burning in his legs as well. Endorses numbness/tingling in the feet bilaterally. Calves are tender to touch x 6 months. Also notes that his L foot feels cooler to touch than the R, but this is stable.  Medications have not helped this pain much. This discomfort has kept him from being as active as he'd like to be.  No previous h/o leg surgery. Per pt report, he had stents placed at one time but cannot tell me if they were in his heart or his legs. From EMR review, it looked like he was scheduled to have a cath with potential revascularization in 2013 however this was not done due to anemia. No other mention of procedures from EMR  Social History: current smoker  ROS: All other systems reviewed and are negative.  Past Medical History Patient Active Problem List   Diagnosis Date Noted  . Claudication of both lower extremities 07/15/2015  . Left foot pain 03/31/2015  . Proteinuria 08/27/2014  . Leg cramps 04/30/2014  . Rectal bleeding 04/23/2014  . Diabetic neuropathy 02/27/2014  . Fall at home 08/12/2013  . Iron deficiency anemia 08/01/2012  . Diabetes mellitus type II, controlled 06/18/2009  . GERD 01/14/2009  . HYPERCHOLESTEROLEMIA 02/22/2007  . OBESITY, NOS 02/22/2007  . TOBACCO DEPENDENCE 02/22/2007  . HYPERTENSION, BENIGN SYSTEMIC 02/22/2007  . PERIPHERAL VASCULAR DISEASE, UNSPEC. 02/22/2007  . OSTEOARTHRITIS, MULTI SITES  02/22/2007    Medications- reviewed and updated Current Outpatient Prescriptions  Medication Sig Dispense Refill  . aspirin 325 MG tablet Take 650 mg by mouth every morning.     . benazepril (LOTENSIN) 5 MG tablet Take 1 tablet (5 mg total) by mouth daily. 30 tablet 5  . cilostazol (PLETAL) 100 MG tablet Take 1 tablet (100 mg total) by mouth 2 (two) times daily. 60 tablet 11  . clopidogrel (PLAVIX) 75 MG tablet TAKE 1 TABLET EVERY DAY 90 tablet 3  . ezetimibe-simvastatin (VYTORIN) 10-40 MG per tablet Take 1 tablet by mouth every morning. 30 tablet 11  . gabapentin (NEURONTIN) 800 MG tablet Take 1 tablet (800 mg total) by mouth 3 (three) times daily. 90 tablet 3  . hydrocortisone (PROCTOSOL HC) 2.5 % rectal cream Place 1 application rectally 2 (two) times daily. 30 g 0  . metFORMIN (GLUCOPHAGE) 1000 MG tablet Take 1 tablet (1,000 mg total) by mouth 2 (two) times daily with a meal. 60 tablet 6  . Nebivolol HCl (BYSTOLIC) 20 MG TABS Take 1 tablet (20 mg total) by mouth daily. 30 tablet 11  . pantoprazole (PROTONIX) 40 MG tablet TAKE 1 TABLET BY MOUTH EVERY DAY 30 tablet 11  . traMADol (ULTRAM) 50 MG tablet Take 1 tablet (50 mg total) by mouth every 8 (eight) hours as needed. 30 tablet 2   No current facility-administered medications for this visit.    Objective: Office vital signs reviewed. BP 135/63 mmHg  Pulse 94  Temp(Src) 98.3 F (36.8 C) (Oral)  Wt 200 lb (90.719 kg)   Physical Examination:  General: Awake, alert, well- nourished, NAD Cardio: RRR, no m/r/g noted.  Pulm: No increased WOB.  CTAB, without wheezes, rhonchi or crackles noted.  MSK: No edema noted. No swelling, erythema, or redness noted. 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 No calf size discrepancy. Calves TTP b/l. L toes feel slightly cooler to touch than the R toes. Unable to palpate DP or TP pulses bilaterally. Capillary refill ~3 seconds in the great toe bilaterally. Neuro: Strength grossly intact in  the LE, Sensation to soft and prick intact in the feet bilaterally.  Arterial dopplers:  The right common femoral artery exhibits 50-74% stenosis. The right profunda femoral artery exhibits 30-49% stenosis. The right femoral artery appears to be occluded with recanalization at the popliteal artery.  The left profunda femoral artery exhibits 50-74% stenosis. The left femoral artery appears to be occluded with recanalization at the distal femoral artery.  Assessment/Plan: PERIPHERAL VASCULAR DISEASE, UNSPEC. Patient's symptoms and LE arterial duplex c/w with severe PAD bilaterally. Unsure about previous history of revascularization procedures however the occlusion of both the right and left femoral arteries with recanalization suggests collateral formation due to progressively worsening stenosis over years.  Risk factors include DM, HTN, and smoking. Pain has not acutely worsened over the last few days, this seems to have been progressively worsening over the last several years with acute worsening over the last 6 months. Given pain and physical exam findings are stable per patient (L foot has been cooler to touch than the R x 6 months per his account), I'm less concerned with ischemic limbs. - Referred urgently to vascular surgery -Discussed smoking cessation  - Continue Pletal for now.  - RTC precautions discussed: Worsening pain or numbness, change in color, or the development if redness, warmth, swelling and pain in his legs. Pt voiced understanding.     Orders Placed This Encounter  Procedures  . Ambulatory referral to Vascular Surgery    Referral Priority:  Urgent    Referral Type:  Surgical    Referral Reason:  Specialty Services Required    Requested Specialty:  Vascular Surgery    Number of Visits Requested:  1    No orders of the defined types were placed in this encounter.    Archie Patten PGY-2, [facility]

## 2015-07-20 ENCOUNTER — Encounter: Payer: Self-pay | Admitting: Vascular Surgery

## 2015-07-21 ENCOUNTER — Ambulatory Visit (INDEPENDENT_AMBULATORY_CARE_PROVIDER_SITE_OTHER): Payer: Medicare Other | Admitting: Vascular Surgery

## 2015-07-21 ENCOUNTER — Other Ambulatory Visit: Payer: Self-pay

## 2015-07-21 ENCOUNTER — Encounter: Payer: Self-pay | Admitting: Vascular Surgery

## 2015-07-21 VITALS — BP 131/64 | HR 92 | Temp 98.6°F | Resp 18 | Ht 67.0 in | Wt 200.0 lb

## 2015-07-21 DIAGNOSIS — I739 Peripheral vascular disease, unspecified: Secondary | ICD-10-CM

## 2015-07-21 NOTE — Progress Notes (Signed)
HISTORY AND PHYSICAL     CC:  Pain in legs Referring Provider:  Elberta Leatherwood, MD  HPI: This is a 64 y.o. male with a hx of PAD who presents today with worsening pain in his calves when he walks and at rest with his left leg worse that his right.  He states he does not have pain in his thigh or buttocks.   He states that he can walk about 25-30 feet and he has to stop and rest.  After about 5-10 minutes of rest, he can proceed.  He also has complaints of weakness and numbness in his legs.  He denies any non healing wounds.    He has a hx of multiple interventions in the past by Dr. Everitt Amber which include stenting of the right SFA 07/13/09, PTCA and stenting of the left SFA and popliteal artery 10/04/05 and PTCA of the right SFA on 12/27/05.  He states that he has not had any other interventions since then.  He is on Plavix for these stents.    He has a hx of back surgery at the C5-C6 and C6-C7 level in 2009.    He is diabetic and takes Metformin.  He is also on a statin for management of his cholesterol.  He is on a beta blocker and ACEI for blood pressure management.    He smokes a cigar every other day and has a couple of cans of beer on the weekends.  Past Medical History  Diagnosis Date  . Diabetes mellitus   . GERD (gastroesophageal reflux disease)   . Hyperlipidemia   . Hypertension   . Arthritis     OA  . Cervical radiculopathy     Dr. Vertell Limber neurosurgery  . PAD (peripheral artery disease)   . Tobacco user   . Chronic lower back pain   . Toe fracture, right 05/09/2011    Past Surgical History  Procedure Laterality Date  . Anterior cervical decomp/discectomy fusion  03/08/12    C6-7  . Vascular surgery  ~ 2007    Stent SFA   . Back surgery  1996  . Lumbar disc surgery  1996    "lower"  . Inguinal hernia repair  1990's    right  . Anterior cervical decomp/discectomy fusion  03/08/2012    Procedure: ANTERIOR CERVICAL DECOMPRESSION/DISCECTOMY FUSION 1 LEVEL/HARDWARE REMOVAL;   Surgeon: Erline Levine, MD;  Location: [facility];  Service: Neurosurgery;  Laterality: N/A;  revison of C5-7 anterior cervical decompression with fusion with Cervical Five-Thoracic One anterior cervical decompression with fusion with interbody prothesis plating and bonegraft  . Colonoscopy w/ biopsies and polypectomy  08/17/2012    f/u 5 years, 4 polyps, no high grade dysplasia or malignancy, tubular adenoma, hyperplastic polyops  . Esophagogastroduodenoscopy  08/17/2012    normal esophagus and GEJ, diffuse gastritis with erythema- no malignancy, reactive gastropathy  with focal intestinal metaplasia    Allergies  Allergen Reactions  . Glipizide     Severe hypoglycemia to 40s.     Current Outpatient Prescriptions  Medication Sig Dispense Refill  . aspirin 325 MG tablet Take 650 mg by mouth every morning.     . benazepril (LOTENSIN) 5 MG tablet Take 1 tablet (5 mg total) by mouth daily. 30 tablet 5  . cilostazol (PLETAL) 100 MG tablet Take 1 tablet (100 mg total) by mouth 2 (two) times daily. 60 tablet 11  . clopidogrel (PLAVIX) 75 MG tablet TAKE 1 TABLET EVERY DAY 90 tablet 3  .  ezetimibe-simvastatin (VYTORIN) 10-40 MG per tablet Take 1 tablet by mouth every morning. 30 tablet 11  . gabapentin (NEURONTIN) 800 MG tablet Take 1 tablet (800 mg total) by mouth 3 (three) times daily. 90 tablet 3  . hydrocortisone (PROCTOSOL HC) 2.5 % rectal cream Place 1 application rectally 2 (two) times daily. 30 g 0  . metFORMIN (GLUCOPHAGE) 1000 MG tablet Take 1 tablet (1,000 mg total) by mouth 2 (two) times daily with a meal. 60 tablet 6  . Nebivolol HCl (BYSTOLIC) 20 MG TABS Take 1 tablet (20 mg total) by mouth daily. 30 tablet 11  . pantoprazole (PROTONIX) 40 MG tablet TAKE 1 TABLET BY MOUTH EVERY DAY 30 tablet 11  . traMADol (ULTRAM) 50 MG tablet Take 1 tablet (50 mg total) by mouth every 8 (eight) hours as needed. 30 tablet 2   No current facility-administered medications for this visit.    Family  History  Problem Relation Age of Onset  . Cancer Mother     colon Cancer  . Heart disease Father   . Hypertension Father     History   Social History  . Marital Status: Legally Separated    Spouse Name: N/A  . Number of Children: N/A  . Years of Education: N/A   Occupational History  . Not on file.   Social History Main Topics  . Smoking status: Current Some Day Smoker    Types: Cigars  . Smokeless tobacco: Never Used     Comment: 03/08/12 "been on electronic cigarettes since first of this month", cigar "every now and then"  . Alcohol Use: 7.8 oz/week    7 Cans of beer, 6 Shots of liquor per week  . Drug Use: No  . Sexual Activity: Yes   Other Topics Concern  . Not on file   Social History Narrative     ROS: [x]  Positive   [ ]  Negative   [ ]  All sytems reviewed and are negative  Cardiovascular: []  chest pain/pressure []  palpitations []  SOB lying flat []  DOE [x]  pain in legs while walking [x]  pain in feet when lying flat []  hx of DVT []  hx of phlebitis [x]  swelling in legs []  varicose veins  Pulmonary: []  productive cough []  asthma []  wheezing  Neurologic: [x]  weakness in []  arms [x]  legs [x]  numbness in []  arms [x]  legs [] difficulty speaking or slurred speech []  temporary loss of vision in one eye []  dizziness  Hematologic: []  bleeding problems []  problems with blood clotting easily  GI []  vomiting blood []  blood in stool  GU: []  burning with urination []  blood in urine  Psychiatric: []  hx of major depression  Integumentary: []  rashes []  ulcers  Constitutional: []  fever []  chills   PHYSICAL EXAMINATION:  Filed Vitals:   07/21/15 1441  BP: 131/64  Pulse: 92  Temp: 98.6 F (37 C)  Resp: 18   Body mass index is 31.32 kg/(m^2).  General:  WDWN in NAD Gait: Not observed HENT: WNL, normocephalic Pulmonary: normal non-labored breathing , without Rales, rhonchi,  wheezing Cardiac: RRR, without  Murmurs, rubs or gallops;  without carotid bruits Abdomen: soft, NT, no masses Skin: without rashes, without ulcers  Vascular Exam/Pulses:  Right Left  Radial 2+ (normal) 2+ (normal)  Femoral Difficult to palpate due to body habitus Difficult to palpate due to body habitus  Popliteal Unable to palpate  Unable to palpate   DP Unable to palpate  Unable to palpate   PT Unable to palpate  Unable to palpate    Extremities: without ischemic changes, without Gangrene , without cellulitis; without open wounds; he does have some varicosities bilaterally  Musculoskeletal: no muscle wasting or atrophy  Neurologic: A&O X 3; Appropriate Affect ; SENSATION: normal; MOTOR FUNCTION:  moving all extremities equally. Speech is fluent/normal   Non-Invasive Vascular Imaging:   Vascular arterial duplex BLE 07/15/15: Summary: Right  The right common femoral artery exhibits 50-74% stenosis. The right profunda femoral artery exhibits 30-49% stenosis. The right femoral artery appears to be occluded with recanalization at the popliteal artery.  Left The left profunda femoral artery exhibits 50-74% stenosis. The left femoral artery appears to be occluded with recanalization at the distal femoral artery.  Pt meds includes: Statin:  Yes.   Beta Blocker:  Yes.   Aspirin:  Yes.   ACEI:  Yes.   ARB:  No. Other Antiplatelet/Anticoagulant:  Yes.   Plavix   ASSESSMENT/PLAN:: 64 y.o. male with lifestyle limiting claudication and hx of multiple interventions on BLE by Dr. Everitt Amber in the past.   -the pt is having claudication bilaterally with left greater than right and has had multiple interventions in the past -we will set up an aortogram with bilateral lower extremity runoff with possible intervention by Dr. Bridgett Larsson on 07/30/15.  -continue statin, Plavix & Aspirin -tobacco cessation   Leontine Locket, PA-C [facility] [phone]  Clinic MD:  Pt seen and examined in conjunction with Dr. Kellie Simmering   agree with  above  patient having severe claudication symptoms which are more severe on the left and are limiting him significantly. He would like further evaluation Plan angiography to see what options are available

## 2015-07-30 ENCOUNTER — Ambulatory Visit (HOSPITAL_COMMUNITY)
Admission: RE | Admit: 2015-07-30 | Discharge: 2015-07-30 | Disposition: A | Discharge status: Home / Self Care | Payer: Medicare Other | Source: Ambulatory Visit | Attending: Vascular Surgery | Admitting: Vascular Surgery

## 2015-07-30 ENCOUNTER — Encounter (HOSPITAL_COMMUNITY)
Admission: RE | Disposition: A | Discharge status: Home / Self Care | Payer: Medicare Other | Source: Ambulatory Visit | Attending: Vascular Surgery

## 2015-07-30 DIAGNOSIS — Z7982 Long term (current) use of aspirin: Secondary | ICD-10-CM | POA: Insufficient documentation

## 2015-07-30 DIAGNOSIS — E1151 Type 2 diabetes mellitus with diabetic peripheral angiopathy without gangrene: Secondary | ICD-10-CM | POA: Insufficient documentation

## 2015-07-30 DIAGNOSIS — Z79899 Other long term (current) drug therapy: Secondary | ICD-10-CM | POA: Insufficient documentation

## 2015-07-30 DIAGNOSIS — Z981 Arthrodesis status: Secondary | ICD-10-CM | POA: Insufficient documentation

## 2015-07-30 DIAGNOSIS — T82898A Other specified complication of vascular prosthetic devices, implants and grafts, initial encounter: Secondary | ICD-10-CM | POA: Insufficient documentation

## 2015-07-30 DIAGNOSIS — I739 Peripheral vascular disease, unspecified: Secondary | ICD-10-CM | POA: Diagnosis present

## 2015-07-30 DIAGNOSIS — F17290 Nicotine dependence, other tobacco product, uncomplicated: Secondary | ICD-10-CM | POA: Insufficient documentation

## 2015-07-30 DIAGNOSIS — Z7902 Long term (current) use of antithrombotics/antiplatelets: Secondary | ICD-10-CM | POA: Diagnosis not present

## 2015-07-30 DIAGNOSIS — K219 Gastro-esophageal reflux disease without esophagitis: Secondary | ICD-10-CM | POA: Insufficient documentation

## 2015-07-30 DIAGNOSIS — E785 Hyperlipidemia, unspecified: Secondary | ICD-10-CM | POA: Insufficient documentation

## 2015-07-30 DIAGNOSIS — I998 Other disorder of circulatory system: Secondary | ICD-10-CM | POA: Diagnosis not present

## 2015-07-30 DIAGNOSIS — I10 Essential (primary) hypertension: Secondary | ICD-10-CM | POA: Insufficient documentation

## 2015-07-30 HISTORY — PX: PERIPHERAL VASCULAR CATHETERIZATION: SHX172C

## 2015-07-30 HISTORY — PX: LOWER EXTREMITY ANGIOGRAM: SHX5508

## 2015-07-30 LAB — POCT I-STAT, CHEM 8
BUN: 9 mg/dL (ref 6–20)
Calcium, Ion: 1.24 mmol/L (ref 1.13–1.30)
Chloride: 102 mmol/L (ref 101–111)
Creatinine, Ser: 0.9 mg/dL (ref 0.61–1.24)
Glucose, Bld: 113 mg/dL — ABNORMAL HIGH (ref 65–99)
HCT: 30 % — ABNORMAL LOW (ref 39.0–52.0)
Hemoglobin: 10.2 g/dL — ABNORMAL LOW (ref 13.0–17.0)
Potassium: 3.9 mmol/L (ref 3.5–5.1)
Sodium: 140 mmol/L (ref 135–145)
TCO2: 22 mmol/L (ref 0–100)

## 2015-07-30 LAB — GLUCOSE, CAPILLARY: Glucose-Capillary: 79 mg/dL (ref 65–99)

## 2015-07-30 SURGERY — ABDOMINAL AORTOGRAM
Anesthesia: LOCAL

## 2015-07-30 MED ORDER — FENTANYL CITRATE (PF) 100 MCG/2ML IJ SOLN
INTRAMUSCULAR | Status: AC
  Filled 2015-07-30: qty 4

## 2015-07-30 MED ORDER — OXYCODONE-ACETAMINOPHEN 5-325 MG PO TABS: 1.0000 | ORAL_TABLET | Freq: Four times a day (QID) | ORAL | Status: DC | PRN

## 2015-07-30 MED ORDER — MORPHINE SULFATE 2 MG/ML IJ SOLN
INTRAMUSCULAR | Status: AC
  Administered 2015-07-30: 2 mg via INTRAMUSCULAR
  Filled 2015-07-30: qty 1

## 2015-07-30 MED ORDER — FENTANYL CITRATE (PF) 100 MCG/2ML IJ SOLN
INTRAMUSCULAR | Status: DC | PRN
  Administered 2015-07-30: 50 ug via INTRAVENOUS

## 2015-07-30 MED ORDER — SODIUM CHLORIDE 0.9 % IV SOLN: 1.0000 mL/kg/h | INTRAVENOUS | Status: DC

## 2015-07-30 MED ORDER — MORPHINE SULFATE 2 MG/ML IJ SOLN: 2.0000 mg | INTRAMUSCULAR | Status: DC | PRN

## 2015-07-30 MED ORDER — MIDAZOLAM HCL 2 MG/2ML IJ SOLN
INTRAMUSCULAR | Status: DC | PRN
  Administered 2015-07-30: 1 mg via INTRAVENOUS

## 2015-07-30 MED ORDER — MIDAZOLAM HCL 2 MG/2ML IJ SOLN
INTRAMUSCULAR | Status: AC
  Filled 2015-07-30: qty 4

## 2015-07-30 MED ORDER — HEPARIN (PORCINE) IN NACL 2-0.9 UNIT/ML-% IJ SOLN
INTRAMUSCULAR | Status: AC
  Filled 2015-07-30: qty 1000

## 2015-07-30 MED ORDER — SODIUM CHLORIDE 0.9 % IV SOLN
INTRAVENOUS | Status: DC
  Administered 2015-07-30: 09:00:00 via INTRAVENOUS

## 2015-07-30 MED ORDER — MORPHINE SULFATE 10 MG/ML IJ SOLN: 2.0000 mg | Freq: Once | INTRAMUSCULAR | Status: DC

## 2015-07-30 MED ORDER — LIDOCAINE HCL (PF) 1 % IJ SOLN
INTRAMUSCULAR | Status: AC
  Filled 2015-07-30: qty 30

## 2015-07-30 SURGICAL SUPPLY — 13 items
CATH OMNI FLUSH 5F 65CM (CATHETERS) ×1 IMPLANT
CATH STRAIGHT 5FR 65CM (CATHETERS) ×1 IMPLANT
COVER PRB 48X5XTLSCP FOLD TPE (BAG) IMPLANT
COVER PROBE 5X48 (BAG) ×3
DEVICE TORQUE .025-.038 (MISCELLANEOUS) ×1 IMPLANT
GUIDEWIRE ANGLED .035X150CM (WIRE) ×1 IMPLANT
KIT MICROINTRODUCER STIFF 5F (SHEATH) ×1 IMPLANT
KIT PV (KITS) ×3 IMPLANT
SHEATH PINNACLE 5F 10CM (SHEATH) ×1 IMPLANT
SYR MEDRAD MARK V 150ML (SYRINGE) ×3 IMPLANT
TRANSDUCER W/STOPCOCK (MISCELLANEOUS) ×3 IMPLANT
TRAY PV CATH (CUSTOM PROCEDURE TRAY) ×3 IMPLANT
WIRE BENTSON .035X145CM (WIRE) ×1 IMPLANT

## 2015-07-30 NOTE — H&P (View-Only) (Signed)
HISTORY AND PHYSICAL     CC:  Pain in legs Referring Provider:  Elberta Leatherwood, MD  HPI: This is a 64 y.o. male with a hx of PAD who presents today with worsening pain in his calves when he walks and at rest with his left leg worse that his right.  He states he does not have pain in his thigh or buttocks.   He states that he can walk about 25-30 feet and he has to stop and rest.  After about 5-10 minutes of rest, he can proceed.  He also has complaints of weakness and numbness in his legs.  He denies any non healing wounds.    He has a hx of multiple interventions in the past by Dr. Everitt Amber which include stenting of the right SFA 07/13/09, PTCA and stenting of the left SFA and popliteal artery 10/04/05 and PTCA of the right SFA on 12/27/05.  He states that he has not had any other interventions since then.  He is on Plavix for these stents.    He has a hx of back surgery at the C5-C6 and C6-C7 level in 2009.    He is diabetic and takes Metformin.  He is also on a statin for management of his cholesterol.  He is on a beta blocker and ACEI for blood pressure management.    He smokes a cigar every other day and has a couple of cans of beer on the weekends.  Past Medical History  Diagnosis Date  . Diabetes mellitus   . GERD (gastroesophageal reflux disease)   . Hyperlipidemia   . Hypertension   . Arthritis     OA  . Cervical radiculopathy     Dr. Vertell Limber neurosurgery  . PAD (peripheral artery disease)   . Tobacco user   . Chronic lower back pain   . Toe fracture, right 05/09/2011    Past Surgical History  Procedure Laterality Date  . Anterior cervical decomp/discectomy fusion  03/08/12    C6-7  . Vascular surgery  ~ 2007    Stent SFA   . Back surgery  1996  . Lumbar disc surgery  1996    "lower"  . Inguinal hernia repair  1990's    right  . Anterior cervical decomp/discectomy fusion  03/08/2012    Procedure: ANTERIOR CERVICAL DECOMPRESSION/DISCECTOMY FUSION 1 LEVEL/HARDWARE REMOVAL;   Surgeon: Erline Levine, MD;  Location: [facility];  Service: Neurosurgery;  Laterality: N/A;  revison of C5-7 anterior cervical decompression with fusion with Cervical Five-Thoracic One anterior cervical decompression with fusion with interbody prothesis plating and bonegraft  . Colonoscopy w/ biopsies and polypectomy  08/17/2012    f/u 5 years, 4 polyps, no high grade dysplasia or malignancy, tubular adenoma, hyperplastic polyops  . Esophagogastroduodenoscopy  08/17/2012    normal esophagus and GEJ, diffuse gastritis with erythema- no malignancy, reactive gastropathy  with focal intestinal metaplasia    Allergies  Allergen Reactions  . Glipizide     Severe hypoglycemia to 40s.     Current Outpatient Prescriptions  Medication Sig Dispense Refill  . aspirin 325 MG tablet Take 650 mg by mouth every morning.     . benazepril (LOTENSIN) 5 MG tablet Take 1 tablet (5 mg total) by mouth daily. 30 tablet 5  . cilostazol (PLETAL) 100 MG tablet Take 1 tablet (100 mg total) by mouth 2 (two) times daily. 60 tablet 11  . clopidogrel (PLAVIX) 75 MG tablet TAKE 1 TABLET EVERY DAY 90 tablet 3  .  ezetimibe-simvastatin (VYTORIN) 10-40 MG per tablet Take 1 tablet by mouth every morning. 30 tablet 11  . gabapentin (NEURONTIN) 800 MG tablet Take 1 tablet (800 mg total) by mouth 3 (three) times daily. 90 tablet 3  . hydrocortisone (PROCTOSOL HC) 2.5 % rectal cream Place 1 application rectally 2 (two) times daily. 30 g 0  . metFORMIN (GLUCOPHAGE) 1000 MG tablet Take 1 tablet (1,000 mg total) by mouth 2 (two) times daily with a meal. 60 tablet 6  . Nebivolol HCl (BYSTOLIC) 20 MG TABS Take 1 tablet (20 mg total) by mouth daily. 30 tablet 11  . pantoprazole (PROTONIX) 40 MG tablet TAKE 1 TABLET BY MOUTH EVERY DAY 30 tablet 11  . traMADol (ULTRAM) 50 MG tablet Take 1 tablet (50 mg total) by mouth every 8 (eight) hours as needed. 30 tablet 2   No current facility-administered medications for this visit.    Family  History  Problem Relation Age of Onset  . Cancer Mother     colon Cancer  . Heart disease Father   . Hypertension Father     History   Social History  . Marital Status: Legally Separated    Spouse Name: N/A  . Number of Children: N/A  . Years of Education: N/A   Occupational History  . Not on file.   Social History Main Topics  . Smoking status: Current Some Day Smoker    Types: Cigars  . Smokeless tobacco: Never Used     Comment: 03/08/12 "been on electronic cigarettes since first of this month", cigar "every now and then"  . Alcohol Use: 7.8 oz/week    7 Cans of beer, 6 Shots of liquor per week  . Drug Use: No  . Sexual Activity: Yes   Other Topics Concern  . Not on file   Social History Narrative     ROS: [x]  Positive   [ ]  Negative   [ ]  All sytems reviewed and are negative  Cardiovascular: []  chest pain/pressure []  palpitations []  SOB lying flat []  DOE [x]  pain in legs while walking [x]  pain in feet when lying flat []  hx of DVT []  hx of phlebitis [x]  swelling in legs []  varicose veins  Pulmonary: []  productive cough []  asthma []  wheezing  Neurologic: [x]  weakness in []  arms [x]  legs [x]  numbness in []  arms [x]  legs [] difficulty speaking or slurred speech []  temporary loss of vision in one eye []  dizziness  Hematologic: []  bleeding problems []  problems with blood clotting easily  GI []  vomiting blood []  blood in stool  GU: []  burning with urination []  blood in urine  Psychiatric: []  hx of major depression  Integumentary: []  rashes []  ulcers  Constitutional: []  fever []  chills   PHYSICAL EXAMINATION:  Filed Vitals:   07/21/15 1441  BP: 131/64  Pulse: 92  Temp: 98.6 F (37 C)  Resp: 18   Body mass index is 31.32 kg/(m^2).  General:  WDWN in NAD Gait: Not observed HENT: WNL, normocephalic Pulmonary: normal non-labored breathing , without Rales, rhonchi,  wheezing Cardiac: RRR, without  Murmurs, rubs or gallops;  without carotid bruits Abdomen: soft, NT, no masses Skin: without rashes, without ulcers  Vascular Exam/Pulses:  Right Left  Radial 2+ (normal) 2+ (normal)  Femoral Difficult to palpate due to body habitus Difficult to palpate due to body habitus  Popliteal Unable to palpate  Unable to palpate   DP Unable to palpate  Unable to palpate   PT Unable to palpate  Unable to palpate    Extremities: without ischemic changes, without Gangrene , without cellulitis; without open wounds; he does have some varicosities bilaterally  Musculoskeletal: no muscle wasting or atrophy  Neurologic: A&O X 3; Appropriate Affect ; SENSATION: normal; MOTOR FUNCTION:  moving all extremities equally. Speech is fluent/normal   Non-Invasive Vascular Imaging:   Vascular arterial duplex BLE 07/15/15: Summary: Right  The right common femoral artery exhibits 50-74% stenosis. The right profunda femoral artery exhibits 30-49% stenosis. The right femoral artery appears to be occluded with recanalization at the popliteal artery.  Left The left profunda femoral artery exhibits 50-74% stenosis. The left femoral artery appears to be occluded with recanalization at the distal femoral artery.  Pt meds includes: Statin:  Yes.   Beta Blocker:  Yes.   Aspirin:  Yes.   ACEI:  Yes.   ARB:  No. Other Antiplatelet/Anticoagulant:  Yes.   Plavix   ASSESSMENT/PLAN:: 64 y.o. male with lifestyle limiting claudication and hx of multiple interventions on BLE by Dr. Everitt Amber in the past.   -the pt is having claudication bilaterally with left greater than right and has had multiple interventions in the past -we will set up an aortogram with bilateral lower extremity runoff with possible intervention by Dr. Bridgett Larsson on 07/30/15.  -continue statin, Plavix & Aspirin -tobacco cessation   Leontine Locket, PA-C [facility] [phone]  Clinic MD:  Pt seen and examined in conjunction with Dr. Kellie Simmering   agree with  above  patient having severe claudication symptoms which are more severe on the left and are limiting him significantly. He would like further evaluation Plan angiography to see what options are available

## 2015-07-30 NOTE — Discharge Instructions (Signed)

## 2015-07-30 NOTE — Interval H&P Note (Signed)
[facility]  History and Physical Update  The patient was interviewed and re-examined.  The patient's previous History and Physical has been reviewed and is unchanged from Dr. Evelena Leyden consult.  There is no change in the plan of care: aortogram, and bilateral leg runoff.  Adele Barthel, MD [facility] Office: [phone] Pager: [phone]  07/30/2015, 10:30 AM

## 2015-07-31 ENCOUNTER — Encounter (HOSPITAL_COMMUNITY): Payer: Self-pay | Admitting: Vascular Surgery

## 2015-07-31 ENCOUNTER — Telehealth: Payer: Self-pay | Admitting: Vascular Surgery

## 2015-07-31 MED FILL — Heparin Sodium (Porcine) 2 Unit/ML in Sodium Chloride 0.9%: INTRAMUSCULAR | Qty: 1000 | Status: AC

## 2015-07-31 MED FILL — Lidocaine HCl Local Preservative Free (PF) Inj 1%: INTRAMUSCULAR | Qty: 30 | Status: AC

## 2015-07-31 NOTE — Telephone Encounter (Signed)
-----   Message from Denman George, RN sent at 07/31/2015  9:24 AM EDT ----- Regarding: Zigmund Daniel log; also needs 2 wk. f/u with [facility] post Aortogram   ----- Message -----    From: Conrad Farmington, MD    Sent: 07/30/2015   2:25 PM      To: [facility] [address]  [name] [id number] 05-Nov-1951  PROCEDURE: 1.  Right common femoral artery cannulation under ultrasound guidance 2.  Placement of catheter in aorta 3.  Aortogram 4.  Second order arterial selection 5.  Left leg runoff 6.  Right leg runoff  Follow-up: 2 weeks with Dr. Kellie Simmering

## 2015-07-31 NOTE — Telephone Encounter (Signed)
Spoke with pt, dpm °

## 2015-08-01 ENCOUNTER — Telehealth: Payer: Self-pay | Admitting: Vascular Surgery

## 2015-08-03 ENCOUNTER — Telehealth: Payer: Self-pay | Admitting: *Deleted

## 2015-08-03 NOTE — Telephone Encounter (Signed)
Patient called to request more pain medication. He is not getting medication from [place] currently but is on Tramadol and Neurontin from his PCP at [facility] per patient. I asked him if he wanted me to try and move up his appt with Dr. Kellie Simmering ( 08-18-15) and said no, he has already arranged transportation that day. He said that he would call Dr. Alease Frame for medication. I told them they would have access to our notes and procedures through [facility] system.

## 2015-08-05 ENCOUNTER — Encounter: Payer: Medicare Other | Admitting: Vascular Surgery

## 2015-08-06 ENCOUNTER — Telehealth: Payer: Self-pay | Admitting: Vascular Surgery

## 2015-08-06 NOTE — Telephone Encounter (Signed)
C telephone encounter. Called on 08/01/2015 requesting narcotics. Explained this could not be done over the weekend. Has claudication only.

## 2015-08-10 ENCOUNTER — Other Ambulatory Visit: Payer: Self-pay | Admitting: *Deleted

## 2015-08-11 MED ORDER — EZETIMIBE-SIMVASTATIN 10-40 MG PO TABS: 1.0000 | ORAL_TABLET | Freq: Every morning | ORAL | Status: DC

## 2015-08-17 ENCOUNTER — Encounter: Payer: Self-pay | Admitting: Vascular Surgery

## 2015-08-18 ENCOUNTER — Other Ambulatory Visit: Payer: Self-pay | Admitting: Vascular Surgery

## 2015-08-18 ENCOUNTER — Ambulatory Visit (HOSPITAL_COMMUNITY)
Admission: RE | Admit: 2015-08-18 | Discharge: 2015-08-18 | Disposition: A | Discharge status: Home / Self Care | Payer: Medicare Other | Source: Ambulatory Visit | Attending: Vascular Surgery | Admitting: Vascular Surgery

## 2015-08-18 ENCOUNTER — Encounter: Payer: Self-pay | Admitting: Vascular Surgery

## 2015-08-18 ENCOUNTER — Ambulatory Visit (INDEPENDENT_AMBULATORY_CARE_PROVIDER_SITE_OTHER): Payer: Medicare Other | Admitting: Vascular Surgery

## 2015-08-18 VITALS — BP 141/78 | HR 85 | Temp 96.8°F | Resp 18 | Ht 67.0 in | Wt 203.0 lb

## 2015-08-18 DIAGNOSIS — Z01810 Encounter for preprocedural cardiovascular examination: Secondary | ICD-10-CM

## 2015-08-18 DIAGNOSIS — I739 Peripheral vascular disease, unspecified: Secondary | ICD-10-CM

## 2015-08-18 NOTE — Progress Notes (Signed)
Filed Vitals:   08/18/15 0835 08/18/15 0838  BP: 143/76 141/78  Pulse: 82 85  Temp: 96.8 F (36 C)   Resp: 18   Height: 5\' 7"  (1.702 m)   Weight: 203 lb (92.08 kg)   SpO2: 100%

## 2015-08-18 NOTE — Addendum Note (Signed)
Addended by: Mena Goes on: 08/18/2015 11:39 AM   Modules accepted: Orders

## 2015-08-18 NOTE — Progress Notes (Signed)
Subjective:     Patient ID: [name], male   DOB: 11-Aug-1951, 64 y.o.   MRN: [id number]  HPI this 64 year old male returns to discuss his angiogram findings. This was performed by Dr. Bridgett Larsson  on August 4. Patient has severe claudication in the left leg limiting him to 25 or 30 feet with less severe symptoms on the right leg. He does have some numbness and tingling in both legs at night. He has no history of nonhealing ulcers infection or gangrene. He has no history of coronary artery disease, myocardial infarction, CVA and denies any chest pain, dyspnea on exertion, PND, orthopnea, hemoptysis. He does have a history of lumbar spine surgery 10 years ago.  Past Medical History  Diagnosis Date  . Diabetes mellitus   . GERD (gastroesophageal reflux disease)   . Hyperlipidemia   . Hypertension   . Arthritis     OA  . Cervical radiculopathy     Dr. Vertell Limber neurosurgery  . PAD (peripheral artery disease)   . Tobacco user   . Chronic lower back pain   . Toe fracture, right 05/09/2011    Social History  Substance Use Topics  . Smoking status: Current Some Day Smoker    Types: Cigars  . Smokeless tobacco: Never Used     Comment: 03/08/12 "been on electronic cigarettes since first of this month", cigar "every now and then"  . Alcohol Use: 7.8 oz/week    7 Cans of beer, 6 Shots of liquor per week    Family History  Problem Relation Age of Onset  . Cancer Mother     colon Cancer  . Heart disease Father   . Hypertension Father     Allergies  Allergen Reactions  . Glipizide Other (See Comments)    Severe hypoglycemia to 40s.      Current outpatient prescriptions:  .  acetaminophen (TYLENOL) 500 MG tablet, Take 1,000 mg by mouth every 6 (six) hours as needed for moderate pain., Disp: , Rfl:  .  aspirin 325 MG tablet, Take 650 mg by mouth every morning. , Disp: , Rfl:  .  benazepril (LOTENSIN) 5 MG tablet, Take 1 tablet (5 mg total) by mouth daily., Disp: 30 tablet, Rfl: 5 .   clopidogrel (PLAVIX) 75 MG tablet, TAKE 1 TABLET EVERY DAY (Patient taking differently: Take 75mg  by mouth every day), Disp: 90 tablet, Rfl: 3 .  ezetimibe-simvastatin (VYTORIN) 10-40 MG per tablet, Take 1 tablet by mouth every morning., Disp: 30 tablet, Rfl: 11 .  gabapentin (NEURONTIN) 800 MG tablet, Take 1 tablet (800 mg total) by mouth 3 (three) times daily., Disp: 90 tablet, Rfl: 3 .  metFORMIN (GLUCOPHAGE) 500 MG tablet, Take 500 mg by mouth daily., Disp: , Rfl:  .  Nebivolol HCl (BYSTOLIC) 20 MG TABS, Take 1 tablet (20 mg total) by mouth daily., Disp: 30 tablet, Rfl: 11 .  pantoprazole (PROTONIX) 40 MG tablet, TAKE 1 TABLET BY MOUTH EVERY DAY (Patient taking differently: Take 40 mg by mouth every day), Disp: 30 tablet, Rfl: 11 .  cilostazol (PLETAL) 100 MG tablet, Take 1 tablet (100 mg total) by mouth 2 (two) times daily. (Patient not taking: Reported on 07/28/2015), Disp: 60 tablet, Rfl: 11 .  hydrocortisone (PROCTOSOL HC) 2.5 % rectal cream, Place 1 application rectally 2 (two) times daily. (Patient not taking: Reported on 07/28/2015), Disp: 30 g, Rfl: 0 .  traMADol (ULTRAM) 50 MG tablet, Take 1 tablet (50 mg total) by mouth every 8 (eight) hours  as needed. (Patient not taking: Reported on 07/28/2015), Disp: 30 tablet, Rfl: 2  Filed Vitals:   08/18/15 0835 08/18/15 0838  BP: 143/76 141/78  Pulse: 82 85  Temp: 96.8 F (36 C)   Resp: 18   Height: 5\' 7"  (1.702 m)   Weight: 203 lb (92.08 kg)   SpO2: 100%     Body mass index is 31.79 kg/(m^2).        \   Review of Systems see history of present illness. No cardiac symptoms such as chest pain, dyspnea on exertion, PND, orthopnea, hemoptysis. Does have chronic back discomfort and GERD. Has diabetes mellitus type 1. Smokes 2 cigars a day has not smoked cigarettes in many years. Other systems negative and complete review of systems     Objective:   Physical Exam BP 141/78 mmHg  Pulse 85  Temp(Src) 96.8 F (36 C)  Resp 18  Ht 5\' 7"   (1.702 m)  Wt 203 lb (92.08 kg)  BMI 31.79 kg/m2  SpO2 100%  Gen.-alert and oriented x3 in no apparent distress HEENT normal for age Lungs no rhonchi or wheezing Cardiovascular regular rhythm no murmurs carotid pulses 3+ palpable no bruits audible Abdomen soft nontender no palpable masses Musculoskeletal free of  major deformities Skin clear -no rashes Neurologic normal Lower extremities 2+ femoral pulses palpable bilaterally. No popliteal or distal pulses. No active infection, ulceration, or gangrene. Sensation intact.  Today I reviewed the angiogram performed on August 4 by Dr. Bridgett Larsson by computer. 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 Right leg has a less favorable situation with occlusion of SFA and all stents in the thigh with reconstitution of a small island of popliteal artery above the knee which then occludes below the knee and one-vessel runoff through peroneal artery.       Assessment:     Severe limiting claudication left leg greater than right with occasional rest discomfort and numbness. Failure of multiple SFA stents placed by Dr.Ganji many years ago. 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 Wrist and benefits thoroughly discussed with patient and he would like to proceed.

## 2015-08-20 ENCOUNTER — Telehealth: Payer: Self-pay | Admitting: Family Medicine

## 2015-08-20 NOTE — Telephone Encounter (Signed)
Is having surgery on his left leg on 9-21. Is having severe pain in that leg.  Could dr presribe pain med to get him to the surgery?

## 2015-08-21 ENCOUNTER — Other Ambulatory Visit: Payer: Self-pay | Admitting: *Deleted

## 2015-08-23 MED ORDER — NAPROXEN 500 MG PO TABS: ORAL_TABLET | ORAL | Status: DC

## 2015-08-24 ENCOUNTER — Other Ambulatory Visit: Payer: Self-pay

## 2015-08-24 NOTE — Telephone Encounter (Signed)
I had a request for med refill for patient's Ultram. This needs an appt. Also, if Ultram is not controlling patient's pain well enough than I would have to see him to prescribe anything other than a NSAID >> Please have him make appt w/ me this week if possible. His surgery is >3wks away, which is a long time to go through (likely) ischemic pain.

## 2015-08-24 NOTE — Telephone Encounter (Signed)
Spoke with patient, appointment scheduled for 8/31

## 2015-08-26 ENCOUNTER — Encounter: Payer: Self-pay | Admitting: Family Medicine

## 2015-08-26 ENCOUNTER — Other Ambulatory Visit: Payer: Self-pay | Admitting: *Deleted

## 2015-08-26 ENCOUNTER — Ambulatory Visit (INDEPENDENT_AMBULATORY_CARE_PROVIDER_SITE_OTHER): Payer: Medicare Other | Admitting: Family Medicine

## 2015-08-26 VITALS — BP 140/70 | HR 83 | Temp 98.1°F | Wt 200.0 lb

## 2015-08-26 DIAGNOSIS — M79672 Pain in left foot: Secondary | ICD-10-CM

## 2015-08-26 DIAGNOSIS — I739 Peripheral vascular disease, unspecified: Secondary | ICD-10-CM

## 2015-08-26 MED ORDER — HYDROCODONE-ACETAMINOPHEN 5-325 MG PO TABS: 1.0000 | ORAL_TABLET | Freq: Two times a day (BID) | ORAL | Status: DC | PRN

## 2015-08-26 MED ORDER — GABAPENTIN 800 MG PO TABS
800.0000 mg | ORAL_TABLET | Freq: Three times a day (TID) | ORAL | Status: DC
Start: 2015-08-26 — End: 2015-10-20

## 2015-08-26 NOTE — Patient Instructions (Signed)
It was a pleasure seeing you today in our clinic. Today we discussed your pain. Here is the treatment plan we have discussed and agreed upon together:   - I have prescribed a temporary medication called Norco. Take this up to 2 times a day for pain as needed. No refills or new prescriptions will be given. - Take 4 total tablets of your gabapentin at day. This can be done by taking 2 tablets for your night time dose. - I'd like to see you back after your surgery. - Don't hesitate to call with any questions.

## 2015-08-27 NOTE — Progress Notes (Signed)
   HPI  CC: leg pain Patient here for continued leg pain and claudication. Patient has diagnosed PAD and is scheduled to have bypass surgery in ~3wks. Patient reports the pain is the same as before. No recent trauma. Patient states that he is unable to sleep most nights due to this pain. He would like something to help control this discomfort. No fevers, chills, CP, SOB, new edema, skin lesions/erythema.   Review of Systems   See HPI for ROS. All other systems reviewed and are negative.  Past medical history and social history reviewed and updated in the EMR as appropriate.  Objective: BP 140/70 mmHg  Pulse 83  Temp(Src) 98.1 F (36.7 C) (Oral)  Wt 200 lb (90.719 kg) Gen: NAD, alert, cooperative, and pleasant. HEENT: NCAT, EOMI, PERRL CV: RRR Resp: CTAB, no wheezes, non-labored Ext: No edema, warm, sensation intact, distal pulses not intact (no change from prior), no signs of infection Neuro: Alert and oriented, Speech clear, No gross deficits  Assessment and plan:  PAD (peripheral artery disease) Patient w/ significant pain from PAD. Pain is similar to what he has been experiencing in the past. Surgery scheduled for 09/16/15. Patient asking for help for pain control until this procedure. - increase gabapentin from 800mg  TID to 800mg  @morning  and midday, and 1600mg  at bedtime. - Norco 5/325 BID PRN    Meds ordered this encounter  Medications  . gabapentin (NEURONTIN) 800 MG tablet    Sig: Take 1 tablet (800 mg total) by mouth 3 (three) times daily. Take 2 tablets at nigh time dose    Dispense:  120 tablet    Refill:  3  . DISCONTD: HYDROcodone-acetaminophen (NORCO/VICODIN) 5-325 MG per tablet    Sig: Take 1 tablet by mouth 2 (two) times daily as needed for moderate pain.    Dispense:  60 tablet    Refill:  0  . HYDROcodone-acetaminophen (NORCO/VICODIN) 5-325 MG per tablet    Sig: Take 1 tablet by mouth 2 (two) times daily as needed for moderate pain.    Dispense:  60  tablet    Refill:  0     Elberta Leatherwood, MD,MS,  PGY2 08/27/2015 7:49 PM

## 2015-08-27 NOTE — Assessment & Plan Note (Signed)
Patient w/ significant pain from PAD. Pain is similar to what he has been experiencing in the past. Surgery scheduled for 09/16/15. Patient asking for help for pain control until this procedure. - increase gabapentin from 800mg  TID to 800mg  @morning  and midday, and 1600mg  at bedtime. - Norco 5/325 BID PRN

## 2015-09-07 ENCOUNTER — Telehealth: Payer: Self-pay | Admitting: Vascular Surgery

## 2015-09-07 NOTE — Pre-Procedure Instructions (Signed)
[name]  09/07/2015      [id number] [name], [place] [address] [place] [address] Phone: [phone] Fax: [phone]    Your procedure is scheduled on Wednesday, September 21st, 2016.   Report to [facility] Admitting at 6:30 A.M.  Call this number if you have problems the morning of surgery:  [phone]   Remember:  Do not eat food or drink liquids after midnight.   Take these medicines the morning of surgery with A SIP OF WATER: Acetaminophen (Tylenol) if needed, Gabapentin (Neurontin), Hydrocodone-acetaminophen (Norco/Vicodin) if needed, Nebivolol (Bystolic), Pantoprazole (Protonix), Tramadol (Ultram).   7 days prior to surgery, stop taking: NSAIDs, Aspirin, Aleve, Naproxen, Ibuprofen, Fish Oil, all herbal medication, and all vitamins.     What do I do about my diabetes medications?   Do not take oral diabetes medicines (pills) the morning of surgery.        Do not wear jewelry.  Do not wear lotions, powders, or colognes.  You may NOT wear deodorant.  Do not shave 48 hours prior to surgery.  Men may shave face and neck.  Do not bring valuables to the hospital.  [facility] is not responsible for any belongings or valuables.  Contacts, dentures or bridgework may not be worn into surgery.  Leave your suitcase in the car.  After surgery it may be brought to your room.  For patients admitted to the hospital, discharge time will be determined by your treatment team.  Patients discharged the day of surgery will not be allowed to drive home.   Special instructions:  See attached.   Please read over the following fact sheets that you were given. Pain Booklet, Coughing and Deep Breathing, Blood Transfusion Information, MRSA Information and Surgical Site Infection Prevention   How to Manage Your Diabetes Before Surgery   Why is it important to control my blood  sugar before and after surgery?   Improving blood sugar levels before and after surgery helps healing and can limit problems.  A way of improving blood sugar control is eating a healthy diet by:  - Eating less sugar and carbohydrates  - Increasing activity/exercise  - Talk with your doctor about reaching your blood sugar goals  High blood sugars (greater than 180 mg/dL) can raise your risk of infections and slow down your recovery so you will need to focus on controlling your diabetes during the weeks before surgery.  Make sure that the doctor who takes care of your diabetes knows about your planned surgery including the date and location.  How do I manage my blood sugars before surgery?   Check your blood sugar at least 4 times a day, 2 days before surgery to make sure that they are not too high or low.   Check your blood sugar the morning of your surgery when you wake up and every 2  hours until you get to the Short-Stay unit.  If your blood sugar is less than 70 mg/dL, you will need to treat for low blood sugar by:  Treat a low blood sugar (less than 70 mg/dL) with 1/2 cup of clear juice (cranberry or apple), 4 glucose tablets, OR glucose gel.  Recheck blood sugar in 15 minutes after treatment (to make sure it is greater than 70 mg/dL).  If blood sugar is not greater than 70 mg/dL on re-check, call [phone] for further instructions.  Report your blood sugar to the Short-Stay nurse when you get to Short-Stay.  References:  [facility], 2007 "How to Manage your Diabetes Before and After Surgery".

## 2015-09-07 NOTE — Telephone Encounter (Signed)
Pt lost his preop instructions regarding meds.  I told him to stop taking his plavix today but continue his aspirin.  Ruta Hinds, MD [facility] Office: [phone] Pager: [phone]

## 2015-09-08 ENCOUNTER — Encounter (HOSPITAL_COMMUNITY): Payer: Self-pay

## 2015-09-08 ENCOUNTER — Encounter (HOSPITAL_COMMUNITY)
Admission: RE | Admit: 2015-09-08 | Discharge: 2015-09-08 | Disposition: A | Discharge status: Home / Self Care | Payer: Medicare Other | Source: Ambulatory Visit | Attending: Vascular Surgery | Admitting: Vascular Surgery

## 2015-09-08 DIAGNOSIS — Z7902 Long term (current) use of antithrombotics/antiplatelets: Secondary | ICD-10-CM | POA: Diagnosis not present

## 2015-09-08 DIAGNOSIS — I10 Essential (primary) hypertension: Secondary | ICD-10-CM | POA: Diagnosis not present

## 2015-09-08 DIAGNOSIS — E785 Hyperlipidemia, unspecified: Secondary | ICD-10-CM | POA: Diagnosis not present

## 2015-09-08 DIAGNOSIS — K219 Gastro-esophageal reflux disease without esophagitis: Secondary | ICD-10-CM | POA: Diagnosis not present

## 2015-09-08 DIAGNOSIS — F17210 Nicotine dependence, cigarettes, uncomplicated: Secondary | ICD-10-CM | POA: Diagnosis not present

## 2015-09-08 DIAGNOSIS — Z7982 Long term (current) use of aspirin: Secondary | ICD-10-CM | POA: Diagnosis not present

## 2015-09-08 DIAGNOSIS — I739 Peripheral vascular disease, unspecified: Secondary | ICD-10-CM | POA: Diagnosis not present

## 2015-09-08 DIAGNOSIS — D509 Iron deficiency anemia, unspecified: Secondary | ICD-10-CM | POA: Diagnosis not present

## 2015-09-08 DIAGNOSIS — E1140 Type 2 diabetes mellitus with diabetic neuropathy, unspecified: Secondary | ICD-10-CM | POA: Diagnosis not present

## 2015-09-08 DIAGNOSIS — Z9119 Patient's noncompliance with other medical treatment and regimen: Secondary | ICD-10-CM | POA: Diagnosis not present

## 2015-09-08 DIAGNOSIS — D649 Anemia, unspecified: Secondary | ICD-10-CM | POA: Diagnosis present

## 2015-09-08 DIAGNOSIS — G2581 Restless legs syndrome: Secondary | ICD-10-CM | POA: Diagnosis not present

## 2015-09-08 DIAGNOSIS — K5900 Constipation, unspecified: Secondary | ICD-10-CM | POA: Diagnosis not present

## 2015-09-08 HISTORY — DX: Reserved for inherently not codable concepts without codable children: IMO0001

## 2015-09-08 LAB — COMPREHENSIVE METABOLIC PANEL
ALT: 11 U/L — ABNORMAL LOW (ref 17–63)
AST: 18 U/L (ref 15–41)
Albumin: 3.7 g/dL (ref 3.5–5.0)
Alkaline Phosphatase: 57 U/L (ref 38–126)
Anion gap: 9 (ref 5–15)
BUN: 10 mg/dL (ref 6–20)
CO2: 23 mmol/L (ref 22–32)
Calcium: 8.7 mg/dL — ABNORMAL LOW (ref 8.9–10.3)
Chloride: 107 mmol/L (ref 101–111)
Creatinine, Ser: 1.03 mg/dL (ref 0.61–1.24)
GFR calc Af Amer: >60 mL/min (ref >60)
GFR calc non Af Amer: >60 mL/min (ref >60)
Glucose, Bld: 124 mg/dL — ABNORMAL HIGH (ref 65–99)
Potassium: 3.9 mmol/L (ref 3.5–5.1)
Sodium: 139 mmol/L (ref 135–145)
Total Bilirubin: 0.7 mg/dL (ref 0.3–1.2)
Total Protein: 6.4 g/dL — ABNORMAL LOW (ref 6.5–8.1)

## 2015-09-08 LAB — TYPE AND SCREEN
ABO/RH(D): A POS
Antibody Screen: NEGATIVE

## 2015-09-08 LAB — URINE MICROSCOPIC-ADD ON

## 2015-09-08 LAB — APTT: aPTT: 31 seconds (ref 24–37)

## 2015-09-08 LAB — URINALYSIS, ROUTINE W REFLEX MICROSCOPIC
Bilirubin Urine: NEGATIVE
Glucose, UA: NEGATIVE mg/dL
Hgb urine dipstick: NEGATIVE
Ketones, ur: NEGATIVE mg/dL
Nitrite: POSITIVE — AB
Protein, ur: NEGATIVE mg/dL
Specific Gravity, Urine: 1.024 (ref 1.005–1.030)
Urobilinogen, UA: 0.2 mg/dL (ref 0.0–1.0)
pH: 5 (ref 5.0–8.0)

## 2015-09-08 LAB — CBC
HCT: 22.6 % — ABNORMAL LOW (ref 39.0–52.0)
Hemoglobin: 6 g/dL — CL (ref 13.0–17.0)
MCH: 15 pg — ABNORMAL LOW (ref 26.0–34.0)
MCHC: 26.5 g/dL — ABNORMAL LOW (ref 30.0–36.0)
MCV: 56.4 fL — ABNORMAL LOW (ref 78.0–100.0)
Platelets: 289 10*3/uL (ref 150–400)
RBC: 4.01 MIL/uL — ABNORMAL LOW (ref 4.22–5.81)
RDW: 21.6 % — ABNORMAL HIGH (ref 11.5–15.5)
WBC: 4.8 10*3/uL (ref 4.0–10.5)

## 2015-09-08 LAB — SURGICAL PCR SCREEN
MRSA, PCR: NEGATIVE
Staphylococcus aureus: POSITIVE — AB

## 2015-09-08 LAB — GLUCOSE, CAPILLARY: Glucose-Capillary: 126 mg/dL — ABNORMAL HIGH (ref 65–99)

## 2015-09-08 LAB — PROTIME-INR
INR: 1.28 (ref 0.00–1.49)
Prothrombin Time: 16.1 seconds — ABNORMAL HIGH (ref 11.6–15.2)

## 2015-09-08 NOTE — Progress Notes (Signed)
Pt. Contacted regarding results of  PCR.  [facility] [address]

## 2015-09-08 NOTE — Progress Notes (Signed)
Lab call critical lab value ,Hemoglobin 6.0.  Spoke with Zigmund Daniel in Dr. Evelena Leyden office and gave her the critical result.She will contact the MD on call,Dr. Kellie Simmering is out of office.

## 2015-09-08 NOTE — Progress Notes (Signed)
This patient scored at an elevated risk for obstructive sleep apnea during a pre-surgical visit using the STOP BANG TOOL. .A score of 4 or greater is considered an elevated risk. 

## 2015-09-09 ENCOUNTER — Encounter (HOSPITAL_COMMUNITY): Payer: Self-pay | Admitting: Emergency Medicine

## 2015-09-09 LAB — HEMOGLOBIN A1C
Hgb A1c MFr Bld: 7 % — ABNORMAL HIGH (ref 4.8–5.6)
Mean Plasma Glucose: 154 mg/dL

## 2015-09-09 NOTE — Progress Notes (Signed)
Anesthesia Chart Review:  Pt is 64 year old male scheduled for L fem-pop bypass graft on 09/16/2015 with Dr. Kellie Simmering.   PMH includes: HTN, PAD, DM, hyperlipidemia, GERD. Current smoker. BMI 31. S/p ACDF 03/08/12.   Medications include: ASA, benazepril, plavix, vytorin, metformin, naproxen, nebivolol, protonix.   Preoperative labs reviewed.  H/H 6.0/22.6. Pt has UTI. Notified Stephanie in Dr. Evelena Leyden office of UTI.   Called and spoke with pt. Pt asymptomatic with severe anemia. Directed pt to go to ER for further evaluation.   EKG 07/30/2015: NSR. Minimal voltage criteria for LVH, may be normal variant  Will follow up on results of ER visit for severe anemia.   Willeen Cass, FNP-BC [facility]/Anesthesiology Phone: [phone] 09/09/2015 4:16 PM

## 2015-09-10 ENCOUNTER — Encounter (HOSPITAL_COMMUNITY): Payer: Self-pay | Admitting: Emergency Medicine

## 2015-09-10 ENCOUNTER — Observation Stay (HOSPITAL_COMMUNITY)
Admission: EM | Admit: 2015-09-10 | Discharge: 2015-09-11 | Disposition: A | Discharge status: Home / Self Care | Payer: Medicare Other | Attending: Family Medicine | Admitting: Family Medicine

## 2015-09-10 DIAGNOSIS — D509 Iron deficiency anemia, unspecified: Principal | ICD-10-CM | POA: Diagnosis present

## 2015-09-10 DIAGNOSIS — Z7982 Long term (current) use of aspirin: Secondary | ICD-10-CM | POA: Insufficient documentation

## 2015-09-10 DIAGNOSIS — D649 Anemia, unspecified: Secondary | ICD-10-CM | POA: Insufficient documentation

## 2015-09-10 DIAGNOSIS — K219 Gastro-esophageal reflux disease without esophagitis: Secondary | ICD-10-CM | POA: Diagnosis present

## 2015-09-10 DIAGNOSIS — K625 Hemorrhage of anus and rectum: Secondary | ICD-10-CM | POA: Diagnosis present

## 2015-09-10 DIAGNOSIS — I739 Peripheral vascular disease, unspecified: Secondary | ICD-10-CM | POA: Diagnosis present

## 2015-09-10 DIAGNOSIS — Z9119 Patient's noncompliance with other medical treatment and regimen: Secondary | ICD-10-CM | POA: Insufficient documentation

## 2015-09-10 DIAGNOSIS — Z720 Tobacco use: Secondary | ICD-10-CM | POA: Diagnosis present

## 2015-09-10 DIAGNOSIS — E7800 Pure hypercholesterolemia, unspecified: Secondary | ICD-10-CM | POA: Diagnosis present

## 2015-09-10 DIAGNOSIS — E119 Type 2 diabetes mellitus without complications: Secondary | ICD-10-CM

## 2015-09-10 DIAGNOSIS — I10 Essential (primary) hypertension: Secondary | ICD-10-CM | POA: Diagnosis present

## 2015-09-10 DIAGNOSIS — F17200 Nicotine dependence, unspecified, uncomplicated: Secondary | ICD-10-CM | POA: Diagnosis present

## 2015-09-10 DIAGNOSIS — Z7902 Long term (current) use of antithrombotics/antiplatelets: Secondary | ICD-10-CM | POA: Insufficient documentation

## 2015-09-10 DIAGNOSIS — E785 Hyperlipidemia, unspecified: Secondary | ICD-10-CM | POA: Insufficient documentation

## 2015-09-10 DIAGNOSIS — K5900 Constipation, unspecified: Secondary | ICD-10-CM | POA: Insufficient documentation

## 2015-09-10 DIAGNOSIS — G2581 Restless legs syndrome: Secondary | ICD-10-CM | POA: Insufficient documentation

## 2015-09-10 DIAGNOSIS — F17210 Nicotine dependence, cigarettes, uncomplicated: Secondary | ICD-10-CM | POA: Insufficient documentation

## 2015-09-10 DIAGNOSIS — E1140 Type 2 diabetes mellitus with diabetic neuropathy, unspecified: Secondary | ICD-10-CM | POA: Diagnosis present

## 2015-09-10 HISTORY — DX: Personal history of other medical treatment: Z92.89

## 2015-09-10 HISTORY — DX: Anemia, unspecified: D64.9

## 2015-09-10 LAB — COMPREHENSIVE METABOLIC PANEL
ALT: 9 U/L — ABNORMAL LOW (ref 17–63)
AST: 22 U/L (ref 15–41)
Albumin: 3.4 g/dL — ABNORMAL LOW (ref 3.5–5.0)
Alkaline Phosphatase: 52 U/L (ref 38–126)
Anion gap: 9 (ref 5–15)
BUN: 9 mg/dL (ref 6–20)
CO2: 23 mmol/L (ref 22–32)
Calcium: 8.6 mg/dL — ABNORMAL LOW (ref 8.9–10.3)
Chloride: 104 mmol/L (ref 101–111)
Creatinine, Ser: 0.97 mg/dL (ref 0.61–1.24)
GFR calc Af Amer: >60 mL/min (ref >60)
GFR calc non Af Amer: >60 mL/min (ref >60)
Glucose, Bld: 140 mg/dL — ABNORMAL HIGH (ref 65–99)
Potassium: 4 mmol/L (ref 3.5–5.1)
Sodium: 136 mmol/L (ref 135–145)
Total Bilirubin: 0.4 mg/dL (ref 0.3–1.2)
Total Protein: 6.5 g/dL (ref 6.5–8.1)

## 2015-09-10 LAB — RETICULOCYTES
RBC.: 4.03 MIL/uL — ABNORMAL LOW (ref 4.22–5.81)
Retic Count, Absolute: 88.7 10*3/uL (ref 19.0–186.0)
Retic Ct Pct: 2.2 % (ref 0.4–3.1)

## 2015-09-10 LAB — IRON AND TIBC
Iron: 10 ug/dL — ABNORMAL LOW (ref 45–182)
Saturation Ratios: 2 % — ABNORMAL LOW (ref 17.9–39.5)
TIBC: 487 ug/dL — ABNORMAL HIGH (ref 250–450)
UIBC: 477 ug/dL

## 2015-09-10 LAB — GLUCOSE, CAPILLARY
Glucose-Capillary: 146 mg/dL — ABNORMAL HIGH (ref 65–99)
Glucose-Capillary: 132 mg/dL — ABNORMAL HIGH (ref 65–99)

## 2015-09-10 LAB — CBC WITH DIFFERENTIAL/PLATELET
Basophils Absolute: 0 10*3/uL (ref 0.0–0.1)
Basophils Relative: 0 %
Eosinophils Absolute: 0.2 10*3/uL (ref 0.0–0.7)
Eosinophils Relative: 3 %
HCT: 22.9 % — ABNORMAL LOW (ref 39.0–52.0)
Hemoglobin: 6.1 g/dL — CL (ref 13.0–17.0)
Lymphocytes Relative: 36 %
Lymphs Abs: 1.9 10*3/uL (ref 0.7–4.0)
MCH: 15.1 pg — ABNORMAL LOW (ref 26.0–34.0)
MCHC: 26.6 g/dL — ABNORMAL LOW (ref 30.0–36.0)
MCV: 56.8 fL — ABNORMAL LOW (ref 78.0–100.0)
Monocytes Absolute: 0.4 10*3/uL (ref 0.1–1.0)
Monocytes Relative: 8 %
Neutro Abs: 2.7 10*3/uL (ref 1.7–7.7)
Neutrophils Relative %: 53 %
Platelets: 257 10*3/uL (ref 150–400)
RBC: 4.03 MIL/uL — ABNORMAL LOW (ref 4.22–5.81)
RDW: 21.8 % — ABNORMAL HIGH (ref 11.5–15.5)
WBC: 5.2 10*3/uL (ref 4.0–10.5)

## 2015-09-10 LAB — PROTIME-INR
INR: 1.26 (ref 0.00–1.49)
Prothrombin Time: 15.9 seconds — ABNORMAL HIGH (ref 11.6–15.2)

## 2015-09-10 LAB — VITAMIN B12: Vitamin B-12: 183 pg/mL (ref 180–914)

## 2015-09-10 LAB — APTT: aPTT: 31 seconds (ref 24–37)

## 2015-09-10 LAB — POC OCCULT BLOOD, ED: Fecal Occult Bld: NEGATIVE

## 2015-09-10 LAB — FERRITIN: Ferritin: 3 ng/mL — ABNORMAL LOW (ref 24–336)

## 2015-09-10 LAB — FOLATE: Folate: 11.2 ng/mL (ref >5.9)

## 2015-09-10 LAB — PREPARE RBC (CROSSMATCH)

## 2015-09-10 MED ORDER — CILOSTAZOL 100 MG PO TABS
100.0000 mg | ORAL_TABLET | Freq: Two times a day (BID) | ORAL | Status: DC
  Administered 2015-09-10 – 2015-09-11 (×2): 100 mg via ORAL
  Filled 2015-09-10 (×3): qty 1

## 2015-09-10 MED ORDER — ACETAMINOPHEN 500 MG PO TABS
1000.0000 mg | ORAL_TABLET | Freq: Four times a day (QID) | ORAL | Status: DC | PRN
  Administered 2015-09-10 – 2015-09-11 (×2): 1000 mg via ORAL
  Filled 2015-09-10 (×2): qty 2

## 2015-09-10 MED ORDER — SODIUM CHLORIDE 0.9 % IV SOLN
Freq: Once | INTRAVENOUS | Status: AC
  Administered 2015-09-10: 10:00:00 via INTRAVENOUS

## 2015-09-10 MED ORDER — HYDROCODONE-ACETAMINOPHEN 5-325 MG PO TABS
1.0000 | ORAL_TABLET | ORAL | Status: AC
  Administered 2015-09-10: 1 via ORAL
  Filled 2015-09-10: qty 1

## 2015-09-10 MED ORDER — ACETAMINOPHEN 500 MG PO TABS
1000.0000 mg | ORAL_TABLET | Freq: Once | ORAL | Status: AC
  Administered 2015-09-10: 1000 mg via ORAL
  Filled 2015-09-10: qty 2

## 2015-09-10 MED ORDER — HYDROCODONE-ACETAMINOPHEN 5-325 MG PO TABS
1.0000 | ORAL_TABLET | Freq: Four times a day (QID) | ORAL | Status: DC | PRN
  Administered 2015-09-10 – 2015-09-11 (×3): 1 via ORAL
  Filled 2015-09-10 (×3): qty 1

## 2015-09-10 MED ORDER — NEBIVOLOL HCL 20 MG PO TABS: 20.0000 mg | ORAL_TABLET | Freq: Every day | ORAL | Status: DC

## 2015-09-10 MED ORDER — EZETIMIBE-SIMVASTATIN 10-40 MG PO TABS
1.0000 | ORAL_TABLET | Freq: Every morning | ORAL | Status: DC
  Administered 2015-09-10 – 2015-09-11 (×2): 1 via ORAL
  Filled 2015-09-10 (×2): qty 1

## 2015-09-10 MED ORDER — PANTOPRAZOLE SODIUM 40 MG PO TBEC
40.0000 mg | DELAYED_RELEASE_TABLET | Freq: Every day | ORAL | Status: DC
  Administered 2015-09-10 – 2015-09-11 (×2): 40 mg via ORAL
  Filled 2015-09-10 (×2): qty 1

## 2015-09-10 MED ORDER — SODIUM CHLORIDE 0.9 % IV SOLN
1000.0000 mL | INTRAVENOUS | Status: DC
  Administered 2015-09-10: 1000 mL via INTRAVENOUS

## 2015-09-10 MED ORDER — NICOTINE 7 MG/24HR TD PT24
7.0000 mg | MEDICATED_PATCH | Freq: Every day | TRANSDERMAL | Status: DC
  Administered 2015-09-10 – 2015-09-11 (×2): 7 mg via TRANSDERMAL
  Filled 2015-09-10 (×2): qty 1

## 2015-09-10 MED ORDER — BENAZEPRIL HCL 5 MG PO TABS
5.0000 mg | ORAL_TABLET | Freq: Every day | ORAL | Status: DC
  Administered 2015-09-10 – 2015-09-11 (×2): 5 mg via ORAL
  Filled 2015-09-10 (×2): qty 1

## 2015-09-10 MED ORDER — INSULIN ASPART 100 UNIT/ML ~~LOC~~ SOLN
0.0000 [IU] | Freq: Three times a day (TID) | SUBCUTANEOUS | Status: DC
  Administered 2015-09-10: 1 [IU] via SUBCUTANEOUS

## 2015-09-10 MED ORDER — INFLUENZA VAC SPLIT QUAD 0.5 ML IM SUSY
0.5000 mL | PREFILLED_SYRINGE | INTRAMUSCULAR | Status: AC
  Administered 2015-09-11: 0.5 mL via INTRAMUSCULAR
  Filled 2015-09-10: qty 0.5

## 2015-09-10 MED ORDER — GABAPENTIN 800 MG PO TABS
800.0000 mg | ORAL_TABLET | Freq: Three times a day (TID) | ORAL | Status: DC
  Filled 2015-09-10 (×2): qty 1

## 2015-09-10 MED ORDER — GABAPENTIN 400 MG PO CAPS
800.0000 mg | ORAL_CAPSULE | Freq: Three times a day (TID) | ORAL | Status: DC
  Administered 2015-09-10 – 2015-09-11 (×4): 800 mg via ORAL
  Filled 2015-09-10 (×4): qty 2

## 2015-09-10 NOTE — ED Provider Notes (Signed)
CSN: [id number]     Arrival date & time 09/10/15  8676 History   First MD Initiated Contact with Patient 09/10/15 0730     Chief Complaint  Patient presents with  . Abnormal Lab   HPI Patient presents to the emergency room for evaluation of abnormal blood test. Patient has history of peripheral vascular disease. He had preop labs for some type of vascular surgery of his lower extremity at the vascular surgeon's office.  Tests were performed earlier this week. Patient was called by the doctor's office and told to come to the emergency room today because of an abnormal blood test result. The patient did not remember what the abnormal result was but the medical records indicate it was his hemoglobin. Patient denies any symptoms. He has not been feeling lightheaded. He denies any trouble with any chest pain or shortness of breath. No trouble with abdominal pain nausea or vomiting. He has not noticed any blood in his stool or dark tarry stools. He does have a history of anemia. He thinks he was treated with iron in the past. Past Medical History  Diagnosis Date  . Diabetes mellitus   . GERD (gastroesophageal reflux disease)   . Hyperlipidemia   . Hypertension   . Arthritis     OA  . Cervical radiculopathy     Dr. Vertell Limber neurosurgery  . PAD (peripheral artery disease)   . Tobacco user   . Chronic lower back pain   . Toe fracture, right 05/09/2011  . Shortness of breath dyspnea     with exertion   Past Surgical History  Procedure Laterality Date  . Anterior cervical decomp/discectomy fusion  03/08/12    C6-7  . Vascular surgery  ~ 2007    Stent SFA   . Back surgery  1996  . Lumbar disc surgery  1996    "lower"  . Inguinal hernia repair  1990's    right  . Anterior cervical decomp/discectomy fusion  03/08/2012    Procedure: ANTERIOR CERVICAL DECOMPRESSION/DISCECTOMY FUSION 1 LEVEL/HARDWARE REMOVAL;  Surgeon: Erline Levine, MD;  Location: [facility];  Service: Neurosurgery;  Laterality: N/A;   revison of C5-7 anterior cervical decompression with fusion with Cervical Five-Thoracic One anterior cervical decompression with fusion with interbody prothesis plating and bonegraft  . Colonoscopy w/ biopsies and polypectomy  08/17/2012    f/u 5 years, 4 polyps, no high grade dysplasia or malignancy, tubular adenoma, hyperplastic polyops  . Esophagogastroduodenoscopy  08/17/2012    normal esophagus and GEJ, diffuse gastritis with erythema- no malignancy, reactive gastropathy  with focal intestinal metaplasia  . Peripheral vascular catheterization N/A 07/30/2015    Procedure: Abdominal Aortogram;  Surgeon: Conrad Alden, MD;  Location: [facility];  Service: Cardiovascular;  Laterality: N/A;  . Lower extremity angiogram Bilateral 07/30/2015    Procedure: Lower Extremity Angiogram;  Surgeon: Conrad Mount Cobb, MD;  Location: [facility];  Service: Cardiovascular;  Laterality: Bilateral;   Family History  Problem Relation Age of Onset  . Cancer Mother     colon Cancer  . Heart disease Father   . Hypertension Father    Social History  Substance Use Topics  . Smoking status: Current Some Day Smoker -- 24 years    Types: Cigars  . Smokeless tobacco: Never Used  . Alcohol Use: 7.8 oz/week    7 Cans of beer, 6 Shots of liquor per week    Review of Systems  All other systems reviewed and are  negative.     Allergies  Glipizide  Home Medications   Prior to Admission medications   Medication Sig Start Date End Date Taking? Authorizing Provider  acetaminophen (TYLENOL) 500 MG tablet Take 1,000 mg by mouth every 6 (six) hours as needed for moderate pain.    Historical Provider, MD  aspirin 325 MG tablet Take 650 mg by mouth every morning.     Historical Provider, MD  benazepril (LOTENSIN) 5 MG tablet Take 1 tablet (5 mg total) by mouth daily. 03/31/15   Timmothy Euler, MD  clopidogrel (PLAVIX) 75 MG tablet TAKE 1 TABLET EVERY DAY Patient taking differently: Take 75mg  by mouth every  day 12/20/14   Timmothy Euler, MD  ezetimibe-simvastatin (VYTORIN) 10-40 MG per tablet Take 1 tablet by mouth every morning. 08/11/15   Elberta Leatherwood, MD  gabapentin (NEURONTIN) 800 MG tablet Take 1 tablet (800 mg total) by mouth 3 (three) times daily. Take 2 tablets at nigh time dose Patient taking differently: Take 800 mg by mouth 3 (three) times daily as needed.  08/26/15   Elberta Leatherwood, MD  HYDROcodone-acetaminophen (NORCO/VICODIN) 5-325 MG per tablet Take 1 tablet by mouth 2 (two) times daily as needed for moderate pain. 08/26/15   Elberta Leatherwood, MD  metFORMIN (GLUCOPHAGE) 500 MG tablet Take 500 mg by mouth daily.    Historical Provider, MD  naproxen (NAPROSYN) 500 MG tablet TAKE 1 TABLET (500 MG TOTAL) BY MOUTH 2 (TWO) TIMES DAILY WITH A MEAL. 08/23/15   Elberta Leatherwood, MD  Nebivolol HCl (BYSTOLIC) 20 MG TABS Take 1 tablet (20 mg total) by mouth daily. 10/21/13   Timmothy Euler, MD  pantoprazole (PROTONIX) 40 MG tablet TAKE 1 TABLET BY MOUTH EVERY DAY Patient taking differently: Take 40 mg by mouth every day 04/10/15   Timmothy Euler, MD   BP 142/72 mmHg  Pulse 78  Temp(Src) 97.6 F (36.4 C) (Oral)  Resp 16  Ht 5\' 7"  (1.702 m)  Wt 200 lb (90.719 kg)  BMI 31.32 kg/m2  SpO2 100% Physical Exam  Constitutional: He appears well-developed and well-nourished. No distress.  HENT:  Head: Normocephalic and atraumatic.  Right Ear: External ear normal.  Left Ear: External ear normal.  Eyes: Right eye exhibits no discharge. Left eye exhibits no discharge. No scleral icterus.  Conjunctiva are pale  Neck: Neck supple. No tracheal deviation present.  Cardiovascular: Normal rate, regular rhythm and intact distal pulses.   Pulmonary/Chest: Effort normal and breath sounds normal. No stridor. No respiratory distress. He has no wheezes. He has no rales.  Abdominal: Soft. Bowel sounds are normal. He exhibits no distension. There is no tenderness. There is no rebound and no guarding.  Genitourinary:  Rectum normal.  Minimal stool noted on the digital rectal exam, guaiac card obtained, no gross blood, no melanotic, no mass  Musculoskeletal: He exhibits no edema or tenderness.  Neurological: He is alert. He has normal strength. No cranial nerve deficit (no facial droop, extraocular movements intact, no slurred speech) or sensory deficit. He exhibits normal muscle tone. He displays no seizure activity. Coordination normal.  Skin: Skin is warm and dry. No rash noted.  Psychiatric: He has a normal mood and affect.  Nursing note and vitals reviewed.   ED Course  Procedures (including critical care time) Labs Review Labs Reviewed  RETICULOCYTES - Abnormal; Notable for the following:    RBC. 4.03 (*)    All other components within normal limits  COMPREHENSIVE METABOLIC  PANEL - Abnormal; Notable for the following:    Glucose, Bld 140 (*)    Calcium 8.6 (*)    Albumin 3.4 (*)    ALT 9 (*)    All other components within normal limits  CBC WITH DIFFERENTIAL/PLATELET - Abnormal; Notable for the following:    RBC 4.03 (*)    Hemoglobin 6.1 (*)    HCT 22.9 (*)    MCV 56.8 (*)    MCH 15.1 (*)    MCHC 26.6 (*)    RDW 21.8 (*)    All other components within normal limits  PROTIME-INR - Abnormal; Notable for the following:    Prothrombin Time 15.9 (*)    All other components within normal limits  FOLATE  APTT  VITAMIN B12  IRON AND TIBC  FERRITIN  POC OCCULT BLOOD, ED  TYPE AND SCREEN  PREPARE RBC (CROSSMATCH)    Medications  0.9 %  sodium chloride infusion (0 mLs Intravenous Stopped 09/10/15 0956)  acetaminophen (TYLENOL) tablet 1,000 mg (1,000 mg Oral Given 09/10/15 0842)  HYDROcodone-acetaminophen (NORCO/VICODIN) 5-325 MG per tablet 1 tablet (1 tablet Oral Given 09/10/15 0953)  0.9 %  sodium chloride infusion ( Intravenous New Bag/Given 09/10/15 0956)      MDM   Final diagnoses:  Anemia, unspecified anemia type  Peripheral vascular disease    Patient's laboratory tests  today confirmed the anemia noted on his preop labs. This is a significant decline. His hemoglobin was in the 10 range last month. Patient has not been having any trouble with chest pain or shortness of breath but he is having increasing pain in his lower extremities. His anemia may be contributing to increased symptoms associated with his peripheral vascular disease.  Guaiac test was negative however I'm still concerned about potential for GI bleeding causing the significant decline in his blood count. He does not appear to have active bleeding and is in no distress. Patient will benefit from a blood transfusion.  This will take several hours to accomplish.  I will consult with his medical doctors regarding overnight observation for couple units of blood transfusion and further evaluation of his anemia.    Dorie Rank, MD 09/10/15 1000

## 2015-09-10 NOTE — ED Notes (Signed)
Pt is going to have vein surgery in left leg next week and was having pre-op labs done this week. He was called by RN at MD office to come to ED to have labs evaluated bc he had an abnormal lab. Pt does not recall what lab it was. Otherwise no complaints

## 2015-09-10 NOTE — Progress Notes (Signed)
MD made aware that labs were process by the lab from this am bloood draws, Md stated its ok .

## 2015-09-10 NOTE — H&P (Signed)
[facility] Admission History and Physical Service Pager: [phone]  Patient name: [name] Medical record number: [id number] Date of birth: 1951-07-25 Age: 64 y.o. Gender: male  Primary Care Provider: Georges Lynch, MD Consultants: None Code Status: Full  Chief Complaint: Abnormal blood test  Assessment and Plan: [name] is a 64 y.o. male presenting after an abnormal blood test, found to be anemic. PMH is significant for well-controlled T2DM, HTN, HLD, PAD, iron deficiency, tobacco use.   Severe iron deficiency anemia: Microcytic anemia has been noted in the past attributed to rectal bleeding. External hemorrhid seen but with no recent symptoms. Colonoscopy 2013 with Dr. Collene Mares showed 4 tubular adenomas, 5 year follow up surveillance recommended. Elliptocytes, target cells noted in blood work but no symptoms of hemolysis and bilirubin normal on CMP. Other cell lines are normal. Typical iron deficiency indices for iron deficiency and not thalassemia, sideroblastic anemias, AOCD.  - Transfuse 2u PRBC and recheck CBC - Given pre-transfusion hypocalcemia, monitor for arrhythmia, paresthesias and give IV Calcium prn.  - Pt states he will not continue taking oral iron at home, thus consider regular iron infusions. Will get Feraheme prior to discharge.   Peripheral arterial disease: With worsening claudication symptoms due, likely, in part, to anemia. Femoral-popliteal artery bypass graft surgery was scheduled for 9/21 but has been canceled.  - Continue to hold ASA, plavix. Discontinue NSAIDs. - Continue pletal.  Leg pain: Bilateral, chronic sounds neuropathic likely due to ischemic peripheral neuropathy. Minor, if any, diabetic neuropathy. Of note, also seems to have restless legs possibly related to iron deficiency.  - Continue home gabapentin and norco, add tylenol - IV iron may help restlessness, also consider ropinirole as outpatient for  RLS  Asymptomatic bacteriuria:  - Add-on urine culture ordered, follow this or development of symptoms  T2DM: Hgb A1c 7.0% on 9/13, only on metformin since Dx ~3 years ago.   - Will hold metformin in case the need for contrast arises.  - Hold ASA (on hold anyway preoperatively) - Sensitive SSI - Continue ACE - Continue gabapentin for neuropathy  HTN: Continue beta blocker and ACE as above.   Tobacco use: Counseled regarding quitting. Not interested at this time.  - Will order low dose nicotine patch.   GERD: Has been on PPI chronically which may be leading to hypocalcemia and limited absorption of dietary iron. Risks and benefits of chronic PPI use in the absence of PUD should be discussed with the patient.   FEN/GI: Saline lock IV after transfusions, regular diet Prophylaxis: SCDs given risk of bleeding  Disposition: Admit for observation, transfusions.   History of Present Illness:  [name] is a 64 y.o. male presenting with an abnormally low hemoglobin.   He reports that he has had worsening L > R claudication in his legs, for which a fem-pop bypass was scheduled. Routine pre-operative screening labs showed a significant abnormality prompting the vascular surgery office to call him last night and recommend that he go to the ED. He couldn't get a ride and reported no symptoms so he got a ride this morning. He denied any change in overall health but was found to have a severe anemia and FPTS was asked to admit.   He has received feraheme as recently as 03/11/2015 but has not needed transfusions. Has no objections to blood products. Denies past anemia work ups/referrals. Denies childhood history of anemia or turning yellow, and family history of anemia. Colonoscopy by Dr. Mar Daring  Mann in 2013 showed 4 tubular adenomas and 5 year repeat screening was advised.  Review Of Systems: Per HPI with the following additions: At the time of admission he denies fevers, chills, night sweats,  weight loss, chest pain, dyspnea, palpitations, dizziness, change in bowel habits - specifically no constipation, blood in stools or melena. Additionally denies any hemoptysis or hematochezia, no hematuria, and no unusual bruising or bleeding. No rashes, trouble or pain with swallowing.   Otherwise 12 point review of systems was performed and was unremarkable.  Patient Active Problem List   Diagnosis Date Noted  . Anemia 09/10/2015  . Claudication of both lower extremities 07/15/2015  . Left foot pain 03/31/2015  . Proteinuria 08/27/2014  . Rectal bleeding 04/23/2014  . Diabetic neuropathy 02/27/2014  . Fall at home 08/12/2013  . Iron deficiency anemia 08/01/2012  . Diabetes mellitus type II, controlled 06/18/2009  . GERD 01/14/2009  . HYPERCHOLESTEROLEMIA 02/22/2007  . OBESITY, NOS 02/22/2007  . TOBACCO DEPENDENCE 02/22/2007  . HYPERTENSION, BENIGN SYSTEMIC 02/22/2007  . PAD (peripheral artery disease) 02/22/2007  . OSTEOARTHRITIS, MULTI SITES 02/22/2007   Past Medical History: Past Medical History  Diagnosis Date  . Diabetes mellitus   . GERD (gastroesophageal reflux disease)   . Hyperlipidemia   . Hypertension   . Arthritis     OA  . Cervical radiculopathy     Dr. Vertell Limber neurosurgery  . PAD (peripheral artery disease)   . Tobacco user   . Chronic lower back pain   . Toe fracture, right 05/09/2011  . Shortness of breath dyspnea     with exertion   Past Surgical History: Past Surgical History  Procedure Laterality Date  . Anterior cervical decomp/discectomy fusion  03/08/12    C6-7  . Vascular surgery  ~ 2007    Stent SFA   . Back surgery  1996  . Lumbar disc surgery  1996    "lower"  . Inguinal hernia repair  1990's    right  . Anterior cervical decomp/discectomy fusion  03/08/2012    Procedure: ANTERIOR CERVICAL DECOMPRESSION/DISCECTOMY FUSION 1 LEVEL/HARDWARE REMOVAL;  Surgeon: Erline Levine, MD;  Location: [facility];  Service: Neurosurgery;  Laterality: N/A;   revison of C5-7 anterior cervical decompression with fusion with Cervical Five-Thoracic One anterior cervical decompression with fusion with interbody prothesis plating and bonegraft  . Colonoscopy w/ biopsies and polypectomy  08/17/2012    f/u 5 years, 4 polyps, no high grade dysplasia or malignancy, tubular adenoma, hyperplastic polyops  . Esophagogastroduodenoscopy  08/17/2012    normal esophagus and GEJ, diffuse gastritis with erythema- no malignancy, reactive gastropathy  with focal intestinal metaplasia  . Peripheral vascular catheterization N/A 07/30/2015    Procedure: Abdominal Aortogram;  Surgeon: Conrad St. Anne, MD;  Location: [facility];  Service: Cardiovascular;  Laterality: N/A;  . Lower extremity angiogram Bilateral 07/30/2015    Procedure: Lower Extremity Angiogram;  Surgeon: Conrad [place], MD;  Location: [facility];  Service: Cardiovascular;  Laterality: Bilateral;   Social History: Social History  Substance Use Topics  . Smoking status: Current Some Day Smoker -- 24 years    Types: Cigars  . Smokeless tobacco: Never Used  . Alcohol Use: 7.8 oz/week    7 Cans of beer, 6 Shots of liquor per week   Additional social history: Lives alone in a [place] apartment. Has had same smoking/drinking routine since leaving the service ([employer] Veteran) in 1972: smokes 2 cigars per day and drinks a total  of about a 6-pack of beer and 2 - 3 shots per week but denies binge drinking, CAGE neg x4. Denies any other drugs.  Please also refer to relevant sections of EMR.  Family History: Family History  Problem Relation Age of Onset  . Cancer Mother     colon Cancer  . Heart disease Father   . Hypertension Father    Allergies and Medications: Allergies  Allergen Reactions  . Glipizide Other (See Comments)    Severe hypoglycemia to 40s.    No current facility-administered medications on file prior to encounter.   Current Outpatient Prescriptions on File Prior to Encounter   Medication Sig Dispense Refill  . acetaminophen (TYLENOL) 500 MG tablet Take 1,000 mg by mouth every 6 (six) hours as needed for moderate pain.    . benazepril (LOTENSIN) 5 MG tablet Take 1 tablet (5 mg total) by mouth daily. 30 tablet 5  . clopidogrel (PLAVIX) 75 MG tablet TAKE 1 TABLET EVERY DAY (Patient taking differently: Take 75mg  by mouth every day) 90 tablet 3  . ezetimibe-simvastatin (VYTORIN) 10-40 MG per tablet Take 1 tablet by mouth every morning. 30 tablet 11  . gabapentin (NEURONTIN) 800 MG tablet Take 1 tablet (800 mg total) by mouth 3 (three) times daily. Take 2 tablets at nigh time dose (Patient taking differently: Take 800 mg by mouth 3 (three) times daily as needed. ) 120 tablet 3  . HYDROcodone-acetaminophen (NORCO/VICODIN) 5-325 MG per tablet Take 1 tablet by mouth 2 (two) times daily as needed for moderate pain. 60 tablet 0  . metFORMIN (GLUCOPHAGE) 500 MG tablet Take 500 mg by mouth daily.    . naproxen (NAPROSYN) 500 MG tablet TAKE 1 TABLET (500 MG TOTAL) BY MOUTH 2 (TWO) TIMES DAILY WITH A MEAL. 60 tablet 0  . Nebivolol HCl (BYSTOLIC) 20 MG TABS Take 1 tablet (20 mg total) by mouth daily. 30 tablet 11  . pantoprazole (PROTONIX) 40 MG tablet TAKE 1 TABLET BY MOUTH EVERY DAY (Patient taking differently: Take 40 mg by mouth every day) 30 tablet 11  . aspirin 325 MG tablet Take 650 mg by mouth every morning.       Objective: BP 146/77 mmHg  Pulse 78  Temp(Src) 98.9 F (37.2 C) (Oral)  Resp 18  Ht 5\' 7"  (1.702 m)  Wt 200 lb (90.719 kg)  BMI 31.32 kg/m2  SpO2 100% Exam: General: Pleasant 64 yo male laying in bed in no distress Eyes: Muddy, anicteric sclerae without injection, PERL ENTM: Edentulous, oropharynx clear Neck: Supple Cardiovascular: Normal rate no murmur or gallop. No JVD, no LE edema Respiratory: Nonlabored on room air with clear lungs Abdomen: Obese, NT, ND, +BS MSK: No gross deformities Skin: No wounds or rashes noted. Neuro: Alert, oriented,  Diffusely weak but nonfocal; decreased sensation and hair in bilateral LEs.  Psych: Appropriate, social.   Labs and Imaging: CBC BMET   Recent Labs Lab 09/10/15 0740  WBC 5.2  HGB 6.1*  HCT 22.9*  PLT 257    Recent Labs Lab 09/10/15 0740  NA 136  K 4.0  CL 104  CO2 23  BUN 9  CREATININE 0.97  GLUCOSE 140*  CALCIUM 8.6*     Lab Results  Component Value Date   IRON 10* 09/10/2015   TIBC 487* 09/10/2015   FERRITIN 3* 09/10/2015  % sat: 2% Folate 11.2 B12: Purdy, MD 09/10/2015, 1:55 PM PGY-3, [place] [facility] pager: [phone], text  pages welcome

## 2015-09-10 NOTE — ED Notes (Signed)
Erlene Quan, EMT grabbed 1 unit of blood from the blood bank for patient.

## 2015-09-10 NOTE — ED Notes (Signed)
Attempted report x1. 

## 2015-09-10 NOTE — ED Notes (Signed)
Ordered pt a carb-modified diet lunch tray.

## 2015-09-11 DIAGNOSIS — D509 Iron deficiency anemia, unspecified: Secondary | ICD-10-CM | POA: Diagnosis not present

## 2015-09-11 DIAGNOSIS — I739 Peripheral vascular disease, unspecified: Secondary | ICD-10-CM | POA: Diagnosis not present

## 2015-09-11 DIAGNOSIS — D649 Anemia, unspecified: Secondary | ICD-10-CM | POA: Diagnosis not present

## 2015-09-11 DIAGNOSIS — E119 Type 2 diabetes mellitus without complications: Secondary | ICD-10-CM | POA: Diagnosis not present

## 2015-09-11 LAB — GLUCOSE, CAPILLARY
Glucose-Capillary: 112 mg/dL — ABNORMAL HIGH (ref 65–99)
Glucose-Capillary: 109 mg/dL — ABNORMAL HIGH (ref 65–99)

## 2015-09-11 LAB — BASIC METABOLIC PANEL
Anion gap: 8 (ref 5–15)
BUN: 9 mg/dL (ref 6–20)
CO2: 23 mmol/L (ref 22–32)
Calcium: 8.7 mg/dL — ABNORMAL LOW (ref 8.9–10.3)
Chloride: 106 mmol/L (ref 101–111)
Creatinine, Ser: 0.88 mg/dL (ref 0.61–1.24)
GFR calc Af Amer: >60 mL/min (ref >60)
GFR calc non Af Amer: >60 mL/min (ref >60)
Glucose, Bld: 100 mg/dL — ABNORMAL HIGH (ref 65–99)
Potassium: 4.1 mmol/L (ref 3.5–5.1)
Sodium: 137 mmol/L (ref 135–145)

## 2015-09-11 LAB — TYPE AND SCREEN
ABO/RH(D): A POS
Antibody Screen: NEGATIVE
Unit division: 0
Unit division: 0

## 2015-09-11 LAB — CBC
HCT: 28.2 % — ABNORMAL LOW (ref 39.0–52.0)
HCT: 27.7 % — ABNORMAL LOW (ref 39.0–52.0)
Hemoglobin: 8.2 g/dL — ABNORMAL LOW (ref 13.0–17.0)
Hemoglobin: 8.1 g/dL — ABNORMAL LOW (ref 13.0–17.0)
MCH: 18.3 pg — ABNORMAL LOW (ref 26.0–34.0)
MCH: 18.2 pg — ABNORMAL LOW (ref 26.0–34.0)
MCHC: 29.1 g/dL — ABNORMAL LOW (ref 30.0–36.0)
MCHC: 29.2 g/dL — ABNORMAL LOW (ref 30.0–36.0)
MCV: 62.8 fL — ABNORMAL LOW (ref 78.0–100.0)
MCV: 62.4 fL — ABNORMAL LOW (ref 78.0–100.0)
Platelets: 251 10*3/uL (ref 150–400)
Platelets: 247 10*3/uL (ref 150–400)
RBC: 4.49 MIL/uL (ref 4.22–5.81)
RBC: 4.44 MIL/uL (ref 4.22–5.81)
RDW: 29 % — ABNORMAL HIGH (ref 11.5–15.5)
RDW: 28.9 % — ABNORMAL HIGH (ref 11.5–15.5)
WBC: 6.9 10*3/uL (ref 4.0–10.5)
WBC: 7.6 10*3/uL (ref 4.0–10.5)

## 2015-09-11 MED ORDER — SODIUM CHLORIDE 0.9 % IV SOLN
510.0000 mg | Freq: Once | INTRAVENOUS | Status: AC
  Administered 2015-09-11: 510 mg via INTRAVENOUS
  Filled 2015-09-11: qty 17

## 2015-09-11 MED ORDER — DOCUSATE SODIUM 100 MG PO CAPS: 100.0000 mg | ORAL_CAPSULE | Freq: Two times a day (BID) | ORAL | Status: DC

## 2015-09-11 MED ORDER — FERROUS SULFATE 325 (65 FE) MG PO TABS: 325.0000 mg | ORAL_TABLET | Freq: Every day | ORAL | Status: DC

## 2015-09-11 NOTE — Discharge Instructions (Signed)
1. Take Iron supplement. I have prescribed Colace, which is a stool softener, because iron can cause constipation.  2. Please make an appointment for follow up with your vascular doctor.  3. Stop taking your aspirin and Plavix for now until we know if you are bleeding from somewhere.

## 2015-09-11 NOTE — Progress Notes (Signed)
Discharge home. Home discharge instruction given, no question verbalized. 

## 2015-09-11 NOTE — Discharge Summary (Signed)
[facility] Discharge Summary  Patient name: [name] Medical record number: [id number] Date of birth: [birth year] Age: 64 y.o. Gender: male Date of Admission: 09/10/2015  Date of Discharge: 09/11/2015 Admitting Physician: Alveda Reasons, MD  Primary Care Provider: Georges Lynch, MD Consultants: None   Indication for Hospitalization: Iron Deficiency Anemia   Discharge Diagnoses/Problem List:  Patient Active Problem List   Diagnosis Date Noted  . Anemia 09/10/2015  . Absolute anemia   . Claudication of both lower extremities 07/15/2015  . Left foot pain 03/31/2015  . Proteinuria 08/27/2014  . Rectal bleeding 04/23/2014  . Diabetic neuropathy 02/27/2014  . Fall at home 08/12/2013  . Iron deficiency anemia 08/01/2012  . Diabetes mellitus type II, controlled 06/18/2009  . GERD 01/14/2009  . HYPERCHOLESTEROLEMIA 02/22/2007  . OBESITY, NOS 02/22/2007  . TOBACCO DEPENDENCE 02/22/2007  . HYPERTENSION, BENIGN SYSTEMIC 02/22/2007  . PAD (peripheral artery disease) 02/22/2007  . OSTEOARTHRITIS, MULTI SITES 02/22/2007    Disposition: Home   Discharge Condition: Stable   Discharge Exam:  Physical Exam: General: pleasant male in NAD  Eyes: Pallor of lower conjunctiva bilaterally  Cardiovascular: RRR. No murmurs appreciated.  Respiratory: CTAB. Non-labored.  Abdomen: obese, NTND Extremities: No LE edema   Brief Hospital Course:  [name] is a 64 year old male who presented with microcytic asymptomatic anemia found during routine blood work prior to scheduled fem-pop bypass. Patient denied fatigue, lightheadness and bleeding. He received 2 uPRBC for anemia and HgB increased from 6.1 --> 8.2. He also received Feraheme prior to discharge.   He was noted to have asymptomatic bacteruria on UA. Urine was cultured.   Issues for Follow Up:  1. Fe deficiency anemia: Unclear what current source of anemia is at present. Needs further workup  outpatient. Discharged home with oral Fe supplementation. May need to have further feraheme transfusions as patient reports poor compliance with Fe supplementation in past secondary to constipation.  2. Sent home with Colace to hopefully increase compliance with Fe. Follow up on constipation.  3. Needs follow up with Vascular. Consider adding back Aspirin and Plavix when completely sure no active bleeding.  4. Results of urine culture   Significant Procedures: none   Significant Labs and Imaging:   Recent Labs Lab 09/10/15 0740 09/10/15 2337 09/11/15 0540  WBC 5.2 6.9 7.6  HGB 6.1* 8.2* 8.1*  HCT 22.9* 28.2* 27.7*  PLT 257 251 247    Recent Labs Lab 09/08/15 0909 09/10/15 0740 09/11/15 0540  NA 139 136 137  K 3.9 4.0 4.1  CL 107 104 106  CO2 23 23 23   GLUCOSE 124* 140* 100*  BUN 10 9 9   CREATININE 1.03 0.97 0.88  CALCIUM 8.7* 8.6* 8.7*  ALKPHOS 57 52  --   AST 18 22  --   ALT 11* 9*  --   ALBUMIN 3.7 3.4*  --    Iron 45 - 182 ug/dL 10 (L)   TIBC 250 - 450 ug/dL 487 (H)   Saturation Ratios 17.9 - 39.5 % 2 (L)   UIBC ug/dL 477        Ferritin 24 - 336 ng/mL 3 (L)              Results/Tests Pending at Time of Discharge: urine culture   Discharge Medications:    Medication List    ASK your doctor about these medications        acetaminophen 500 MG tablet  Commonly known as:  TYLENOL  Take 1,000 mg by mouth every 6 (six) hours as needed for moderate pain.     aspirin 325 MG tablet  Take 650 mg by mouth every morning.     BACTROBAN NASAL 2 %  Generic drug:  mupirocin nasal ointment  Place 1 application into both nostrils 2 (two) times daily. Use one-half of tube in each nostril twice daily for five (5) days. After application, press sides of nose together and gently massage.     benazepril 5 MG tablet  Commonly known as:  LOTENSIN  Take 1 tablet (5 mg total) by mouth daily.     cilostazol 100 MG tablet  Commonly known  as:  PLETAL  Take 100 mg by mouth 2 (two) times daily.     clopidogrel 75 MG tablet  Commonly known as:  PLAVIX  TAKE 1 TABLET EVERY DAY     ezetimibe-simvastatin 10-40 MG per tablet  Commonly known as:  VYTORIN  Take 1 tablet by mouth every morning.     gabapentin 800 MG tablet  Commonly known as:  NEURONTIN  Take 1 tablet (800 mg total) by mouth 3 (three) times daily. Take 2 tablets at nigh time dose     HYDROcodone-acetaminophen 5-325 MG per tablet  Commonly known as:  NORCO/VICODIN  Take 1 tablet by mouth 2 (two) times daily as needed for moderate pain.     metFORMIN 500 MG tablet  Commonly known as:  GLUCOPHAGE  Take 500 mg by mouth daily.     Nebivolol HCl 20 MG Tabs  Commonly known as:  BYSTOLIC  Take 1 tablet (20 mg total) by mouth daily.     pantoprazole 40 MG tablet  Commonly known as:  PROTONIX  TAKE 1 TABLET BY MOUTH EVERY DAY        Discharge Instructions: Please refer to Patient Instructions section of EMR for full details.  Patient was counseled important signs and symptoms that should prompt return to medical care, changes in medications, dietary instructions, activity restrictions, and follow up appointments.   Follow-Up Appointments: Follow-up Information    Follow up with [facility]. Go on 09/23/2015.   Specialty:  Family Medicine   Why:  For Hospital Followup at 1:30 with Dr. Sondra Come information:   [address] [id number] [place] [address] [place], DO 09/11/2015, 1:53 PM PGY-1, [place]

## 2015-09-11 NOTE — Progress Notes (Signed)
Family Medicine Teaching Service Daily Progress Note Intern Pager: [phone]  Patient name: [name] Medical record number: [id number] Date of birth: 1951/08/08 Age: 64 y.o. Gender: male  Primary Care Provider: Georges Lynch, MD Consultants: None Code Status: Full  Assessment and Plan: [name] is a 64 y.o. male presenting after an abnormal blood test, found to be anemic. PMH is significant for well-controlled T2DM, HTN, HLD, PAD, iron deficiency, tobacco use.   Severe iron deficiency anemia: Microcytic anemia has been noted in the past attributed to rectal bleeding. External hemorrhid seen but with no recent symptoms. Colonoscopy 2013 with Dr. Collene Mares showed 4 tubular adenomas, 5 year follow up surveillance recommended. Elliptocytes, target cells noted in blood work but no symptoms of hemolysis and bilirubin normal on CMP. Other cell lines are normal. Typical iron deficiency indices for iron deficiency and not thalassemia, sideroblastic anemias, AOCD.  - Transfuse 2u PRBC and recheck CBC; HgB s/p transfusion is 8.1 - Given pre-transfusion hypocalcemia, monitor for arrhythmia, paresthesias and give IV Calcium prn.  - Feraheme today and will send home with Fe supplements  -FOBT negative   Peripheral arterial disease: With worsening claudication symptoms due, likely, in part, to anemia. Femoral-popliteal artery bypass graft surgery was scheduled for 9/21 but has been canceled.  - Continue to hold ASA, plavix. Discontinue NSAIDs. - Continue pletal.  Leg pain: Bilateral, chronic sounds neuropathic likely due to ischemic peripheral neuropathy. Minor, if any, diabetic neuropathy. Of note, also seems to have restless legs possibly related to iron deficiency.  - Continue home gabapentin and norco, add tylenol - IV iron may help restlessness, also consider ropinirole as outpatient for RLS  Asymptomatic bacteriuria:  -urine culture pending -monitor for symptoms   T2DM: Hgb A1c 7.0% on  9/13, only on metformin since Dx ~3 years ago.  - Will hold metformin in case the need for contrast arises.  - Hold ASA (on hold anyway preoperatively) - Sensitive SSI - Continue ACE - Continue gabapentin for neuropathy  HTN: Continue beta blocker and ACE as above.   Tobacco use: Counseled regarding quitting. Not interested at this time.  - Will order low dose nicotine patch.   GERD: Has been on PPI chronically which may be leading to hypocalcemia and limited absorption of dietary iron. Risks and benefits of chronic PPI use in the absence of PUD should be discussed with the patient.   FEN/GI: Saline lock IV after transfusions, regular diet Prophylaxis: SCDs given risk of bleeding  Disposition: Home pending transfusions   Subjective:  Complains of restless legs. Denies lightheadness or dizziness. Denies bleeding from rectum, gums, or nose.   Objective: Temp:  [96.7 F (35.9 C)-98.9 F (37.2 C)] 98 F (36.7 C) (09/16 0520) Pulse Rate:  [72-89] 84 (09/16 0520) Resp:  [15-21] 21 (09/16 0520) BP: (111-161)/(63-96) 157/76 mmHg (09/16 0520) SpO2:  [97 %-100 %] 97 % (09/16 0520) Physical Exam: General: pleasant male in NAD  Eyes: Pallor of lower conjunctiva bilaterally  Cardiovascular: RRR. No murmurs appreciated.  Respiratory: CTAB. Non-labored.  Abdomen: obese, NTND Extremities: No LE edema   Laboratory:  Recent Labs Lab 09/10/15 0740 09/10/15 2337 09/11/15 0540  WBC 5.2 6.9 7.6  HGB 6.1* 8.2* 8.1*  HCT 22.9* 28.2* 27.7*  PLT 257 251 PENDING    Recent Labs Lab 09/08/15 0909 09/10/15 0740 09/11/15 0540  NA 139 136 137  K 3.9 4.0 4.1  CL 107 104 106  CO2 23 23 23   BUN 10 9 9   CREATININE  1.03 0.97 0.88  CALCIUM 8.7* 8.6* 8.7*  PROT 6.4* 6.5  --   BILITOT 0.7 0.4  --   ALKPHOS 57 52  --   ALT 11* 9*  --   AST 18 22  --   GLUCOSE 124* 140* 100*   Iron 45 - 182 ug/dL 10 (L)   TIBC 250 - 450 ug/dL 487 (H)   Saturation Ratios 17.9 - 39.5 % 2 (L)    UIBC ug/dL 477        Ferritin 24 - 336 ng/mL 3 (L)         Imaging/Diagnostic Tests: No results found.  Nicolette Bang, DO 09/11/2015, 8:39 AM PGY-1, [place] Intern pager: [phone], text pages welcome

## 2015-09-13 LAB — URINE CULTURE: Culture: >=100000

## 2015-09-15 ENCOUNTER — Other Ambulatory Visit: Payer: Self-pay | Admitting: Family Medicine

## 2015-09-15 MED ORDER — CEPHALEXIN 500 MG PO CAPS: 500.0000 mg | ORAL_CAPSULE | Freq: Four times a day (QID) | ORAL | Status: DC

## 2015-09-15 NOTE — Progress Notes (Signed)
Called patient. He denies any symptoms at this time. Due to his gender and only being 64yo I believe its important to treat this infection even w/o symptoms present. I have sent in a prescription for Keflex. I informed him that I'd like him to take these until the bottle is empty.   Due to his surgery tomorrow, I have asked that he call his surgeon's office to inform him of this treatment plan. I will leave decisions on the surgery up to his surgeon.

## 2015-09-16 ENCOUNTER — Encounter (HOSPITAL_COMMUNITY): Admission: RE | Payer: Self-pay | Source: Ambulatory Visit

## 2015-09-16 ENCOUNTER — Inpatient Hospital Stay (HOSPITAL_COMMUNITY): Admission: RE | Admit: 2015-09-16 | Payer: Medicare Other | Source: Ambulatory Visit | Admitting: Vascular Surgery

## 2015-09-16 SURGERY — BYPASS GRAFT FEMORAL-POPLITEAL ARTERY
Anesthesia: General | Laterality: Right

## 2015-09-16 SURGERY — Surgical Case
Anesthesia: *Unknown

## 2015-09-18 ENCOUNTER — Telehealth: Payer: Self-pay | Admitting: *Deleted

## 2015-09-18 NOTE — Telephone Encounter (Signed)
Zigmund Daniel, RN with Dr. [name] called requesting to speak with or leave a message with patient's PCP.  Patient was recently discharge from hospital and has a follow up on Monday with Dr. Emmaline Life for anemia.  Patient's Bypass Graft was cancelled due to very low hgb.  Patient was placed back on his Plavix and Aspirin.  She was requesting orders for patient to stop his Plavix and Aspirin again so his surgery could be rescheduled.  Also to update their office on his follow up orders from upcoming appointment on Monday.  Please give Zigmund Daniel, RN a call at [phone].  Derl Barrow, RN

## 2015-09-21 ENCOUNTER — Other Ambulatory Visit: Payer: Self-pay | Admitting: *Deleted

## 2015-09-21 DIAGNOSIS — I10 Essential (primary) hypertension: Secondary | ICD-10-CM

## 2015-09-21 NOTE — Telephone Encounter (Signed)
[name];  I will be seeing this patient in the office later this week. Will likely request patient HOLDS his plavix and ASA due to blood loss, but will reassess at that time.

## 2015-09-22 ENCOUNTER — Other Ambulatory Visit: Payer: Self-pay | Admitting: *Deleted

## 2015-09-22 DIAGNOSIS — I10 Essential (primary) hypertension: Secondary | ICD-10-CM

## 2015-09-22 MED ORDER — BENAZEPRIL HCL 5 MG PO TABS: 5.0000 mg | ORAL_TABLET | Freq: Every day | ORAL | Status: DC

## 2015-09-23 ENCOUNTER — Encounter: Payer: Self-pay | Admitting: Internal Medicine

## 2015-09-23 ENCOUNTER — Ambulatory Visit (INDEPENDENT_AMBULATORY_CARE_PROVIDER_SITE_OTHER): Payer: Medicare Other | Admitting: Internal Medicine

## 2015-09-23 VITALS — BP 126/78 | HR 88 | Temp 97.9°F | Ht 67.5 in | Wt 196.6 lb

## 2015-09-23 DIAGNOSIS — D509 Iron deficiency anemia, unspecified: Secondary | ICD-10-CM

## 2015-09-23 DIAGNOSIS — I739 Peripheral vascular disease, unspecified: Secondary | ICD-10-CM

## 2015-09-23 LAB — CBC
HCT: 35.7 % — ABNORMAL LOW (ref 39.0–52.0)
Hemoglobin: 10.4 g/dL — ABNORMAL LOW (ref 13.0–17.0)
MCH: 19.7 pg — ABNORMAL LOW (ref 26.0–34.0)
MCHC: 29.1 g/dL — ABNORMAL LOW (ref 30.0–36.0)
MCV: 67.5 fL — ABNORMAL LOW (ref 78.0–100.0)
Platelets: 339 10*3/uL (ref 150–400)
RBC: 5.29 MIL/uL (ref 4.22–5.81)
WBC: 5.4 10*3/uL (ref 4.0–10.5)

## 2015-09-23 NOTE — Progress Notes (Signed)
Patient ID: [name], male   DOB: 1951-04-17, 64 y.o.   MRN: [id number]   [facility] Kerrin Mo, MD Phone: [phone]  Subjective:   # Iron deficency anemia: Has been taking iron without any issue and also been taking colace with has helped with constipation. Patient has been taking a PPI for about six years now. No bleeding from stools, no issues with changes in bowel movements. Patient has hx of multiple polyps in the past, with last colonoscopy/EGD done in 2013. No dizziness, light headness or weakness per patient  # Vascular: Needs follow up with Vascular once GI has worked patient up for possible GI bleed. Patient currently not taking his Plavix or aspirin    #UTI, positive for E.coli in urine - Patient has been taking Keflex. No dysuria or freq changes in urine. No hematuria.   # Left leg pain: Patient with hx of PAD, waiting to get fem-pop bypass.   All relevant systems were reviewed and were negative unless otherwise noted in the HPI  Past Medical History Reviewed problem list.  Medications- reviewed and updated Current Outpatient Prescriptions  Medication Sig Dispense Refill  . acetaminophen (TYLENOL) 500 MG tablet Take 1,000 mg by mouth every 6 (six) hours as needed for moderate pain.    . benazepril (LOTENSIN) 5 MG tablet Take 1 tablet (5 mg total) by mouth daily. 30 tablet 5  . cephALEXin (KEFLEX) 500 MG capsule Take 1 capsule (500 mg total) by mouth 4 (four) times daily. 40 capsule 0  . cilostazol (PLETAL) 100 MG tablet Take 100 mg by mouth 2 (two) times daily.  11  . docusate sodium (COLACE) 100 MG capsule Take 1 capsule (100 mg total) by mouth 2 (two) times daily. 60 capsule 1  . ezetimibe-simvastatin (VYTORIN) 10-40 MG per tablet Take 1 tablet by mouth every morning. 30 tablet 11  . ferrous sulfate (FERROUSUL) 325 (65 FE) MG tablet Take 1 tablet (325 mg total) by mouth daily with breakfast. 30 tablet 1  . gabapentin (NEURONTIN) 800 MG  tablet Take 1 tablet (800 mg total) by mouth 3 (three) times daily. Take 2 tablets at nigh time dose (Patient taking differently: Take 800 mg by mouth 3 (three) times daily. ) 120 tablet 3  . HYDROcodone-acetaminophen (NORCO/VICODIN) 5-325 MG per tablet Take 1 tablet by mouth 2 (two) times daily as needed for moderate pain. 60 tablet 0  . metFORMIN (GLUCOPHAGE) 500 MG tablet Take 500 mg by mouth daily.    . mupirocin nasal ointment (BACTROBAN NASAL) 2 % Place 1 application into both nostrils 2 (two) times daily. Use one-half of tube in each nostril twice daily for five (5) days. After application, press sides of nose together and gently massage.    . Nebivolol HCl (BYSTOLIC) 20 MG TABS Take 1 tablet (20 mg total) by mouth daily. 30 tablet 11  . pantoprazole (PROTONIX) 40 MG tablet TAKE 1 TABLET BY MOUTH EVERY DAY (Patient taking differently: Take 40 mg by mouth every day) 30 tablet 11   No current facility-administered medications for this visit.   Chief complaint-noted No additions to family history Social history- patient smokes cigars  Objective: BP 126/78 mmHg  Pulse 88  Temp(Src) 97.9 F (36.6 C) (Oral)  Ht 5' 7.5" (1.715 m)  Wt 196 lb 9.6 oz (89.177 kg)  BMI 30.32 kg/m2 Gen: NAD, alert, cooperative with exam CV: RRR, good S1/S2, no murmur, cap refill <3 Resp: CTABL, no wheezes, non-labored Abd: SNTND, BS  present, no guarding or organomegaly Ext: No edema, warm, normal tone,  Neuro: Alert and oriented, No gross deficits Skin: no rashes no lesions  Assessment/Plan: See problem based a/p PAD (peripheral artery disease) Please continue tylenol for pain. Please do not use Ibuprofen at this time due hx of bleeding. Please follow up as needed with PCP for narcotics    Iron deficiency anemia Iron Deficiency  Anemia. Hosptial f/u after transfusion hgb dropping from 10 to 6 over the course of a month indicating possibility of GI bleed. Patient also taking PPI over the past 6 years  creating the possibility of decreased iron absorption having occurred.  - Will set up referral to Dr. Collene Mares, GI for work-up - Patient compliant with IRON supplement, no issues with constipation (taking colace).

## 2015-09-23 NOTE — Patient Instructions (Addendum)
As discussed will need to follow up with primary care provider for pain management. Will need to gastroenterology for follow up due to drop in blood levels. Continue Keflex until finished.  Iron Deficiency Anemia Anemia is a condition in which there are less red blood cells or hemoglobin in the blood than normal. Hemoglobin is the part of red blood cells that carries oxygen. Iron deficiency anemia is anemia caused by too little iron. It is the most common type of anemia. It may leave you tired and short of breath. CAUSES   Lack of iron in the diet.  Poor absorption of iron, as seen with intestinal disorders.  Intestinal bleeding.  Heavy periods. SIGNS AND SYMPTOMS  Mild anemia may not be noticeable. Symptoms may include:  Fatigue.  Headache.  Pale skin.  Weakness.  Tiredness.  Shortness of breath.  Dizziness.  Cold hands and feet.  Fast or irregular heartbeat. DIAGNOSIS  Diagnosis requires a thorough evaluation and physical exam by your health care provider. Blood tests are generally used to confirm iron deficiency anemia. Additional tests may be done to find the underlying cause of your anemia. These may include:  Testing for blood in the stool (fecal occult blood test).  A procedure to see inside the colon and rectum (colonoscopy).  A procedure to see inside the esophagus and stomach (endoscopy). TREATMENT  Iron deficiency anemia is treated by correcting the cause of the deficiency. Treatment may involve:  Adding iron-rich foods to your diet.  Taking iron supplements. Pregnant or breastfeeding women need to take extra iron because their normal diet usually does not provide the required amount.  Taking vitamins. Vitamin C improves the absorption of iron. Your health care provider may recommend that you take your iron tablets with a glass of orange juice or vitamin C supplement.  Medicines to make heavy menstrual flow lighter.  Surgery. HOME CARE INSTRUCTIONS    Take iron as directed by your health care provider.  If you cannot tolerate taking iron supplements by mouth, talk to your health care provider about taking them through a vein (intravenously) or an injection into a muscle.  For the best iron absorption, iron supplements should be taken on an empty stomach. If you cannot tolerate them on an empty stomach, you may need to take them with food.  Do not drink milk or take antacids at the same time as your iron supplements. Milk and antacids may interfere with the absorption of iron.  Iron supplements can cause constipation. Make sure to include fiber in your diet to prevent constipation. A stool softener may also be recommended.  Take vitamins as directed by your health care provider.  Eat a diet rich in iron. Foods high in iron include liver, lean beef, whole-grain bread, eggs, dried fruit, and dark green leafy vegetables. SEEK IMMEDIATE MEDICAL CARE IF:   You faint. If this happens, do not drive. Call your local emergency services (911 in [place] if no other help is available.  You have chest pain.  You feel nauseous or vomit.  You have severe or increased shortness of breath with activity.  You feel weak.  You have a rapid heartbeat.  You have unexplained sweating.  You become light-headed when getting up from a chair or bed. MAKE SURE YOU:   Understand these instructions.  Will watch your condition.  Will get help right away if you are not doing well or get worse. Document Released: 12/09/2000 Document Revised: 12/17/2013 Document Reviewed: 08/19/2013 [employer] Patient Information  2015 [employer]. This information is not intended to replace advice given to you by your health care provider. Make sure you discuss any questions you have with your health care provider.  

## 2015-09-23 NOTE — Assessment & Plan Note (Signed)
Please continue tylenol for pain. Please do not use Ibuprofen at this time due hx of bleeding. Please follow up as needed with PCP for narcotics

## 2015-09-23 NOTE — Assessment & Plan Note (Signed)
Iron Deficiency  Anemia. Hosptial f/u after transfusion hgb dropping from 10 to 6 over the course of a month indicating possibility of GI bleed. Patient also taking PPI over the past 6 years creating the possibility of decreased iron absorption having occurred.  - Will set up referral to Dr. Collene Mares, GI for work-up - Patient compliant with IRON supplement, no issues with constipation (taking colace).

## 2015-09-29 DIAGNOSIS — Z1211 Encounter for screening for malignant neoplasm of colon: Secondary | ICD-10-CM | POA: Diagnosis not present

## 2015-09-29 DIAGNOSIS — D509 Iron deficiency anemia, unspecified: Secondary | ICD-10-CM | POA: Diagnosis not present

## 2015-09-29 DIAGNOSIS — K921 Melena: Secondary | ICD-10-CM | POA: Diagnosis not present

## 2015-09-29 DIAGNOSIS — K219 Gastro-esophageal reflux disease without esophagitis: Secondary | ICD-10-CM | POA: Diagnosis not present

## 2015-10-07 DIAGNOSIS — Z86010 Personal history of colonic polyps: Secondary | ICD-10-CM | POA: Diagnosis not present

## 2015-10-07 DIAGNOSIS — D509 Iron deficiency anemia, unspecified: Secondary | ICD-10-CM | POA: Diagnosis not present

## 2015-10-07 DIAGNOSIS — K2960 Other gastritis without bleeding: Secondary | ICD-10-CM | POA: Diagnosis not present

## 2015-10-07 DIAGNOSIS — Z1211 Encounter for screening for malignant neoplasm of colon: Secondary | ICD-10-CM | POA: Diagnosis not present

## 2015-10-07 DIAGNOSIS — K635 Polyp of colon: Secondary | ICD-10-CM | POA: Diagnosis not present

## 2015-10-07 DIAGNOSIS — D123 Benign neoplasm of transverse colon: Secondary | ICD-10-CM | POA: Diagnosis not present

## 2015-10-07 DIAGNOSIS — K2970 Gastritis, unspecified, without bleeding: Secondary | ICD-10-CM | POA: Diagnosis not present

## 2015-10-20 ENCOUNTER — Ambulatory Visit (INDEPENDENT_AMBULATORY_CARE_PROVIDER_SITE_OTHER): Payer: Medicare Other | Admitting: Family Medicine

## 2015-10-20 ENCOUNTER — Encounter: Payer: Self-pay | Admitting: Family Medicine

## 2015-10-20 VITALS — BP 142/88 | HR 87 | Temp 98.6°F | Ht 67.5 in | Wt 198.0 lb

## 2015-10-20 DIAGNOSIS — M79672 Pain in left foot: Secondary | ICD-10-CM

## 2015-10-20 DIAGNOSIS — I739 Peripheral vascular disease, unspecified: Secondary | ICD-10-CM

## 2015-10-20 DIAGNOSIS — D509 Iron deficiency anemia, unspecified: Secondary | ICD-10-CM | POA: Diagnosis not present

## 2015-10-20 MED ORDER — HYDROCODONE-ACETAMINOPHEN 5-325 MG PO TABS: 1.0000 | ORAL_TABLET | Freq: Two times a day (BID) | ORAL | Status: DC | PRN

## 2015-10-20 MED ORDER — GABAPENTIN 800 MG PO TABS: 1200.0000 mg | ORAL_TABLET | Freq: Three times a day (TID) | ORAL | Status: DC

## 2015-10-20 NOTE — Progress Notes (Signed)
   HPI  CC: Bilateral leg pain Patient is here due to his bilateral leg pain. Left is worse than right. Patient states that he is had good compliance with his medication regimen. Unfortunately recent events made him unable to undergo vascular surgery to help improve his circulation to his legs. This procedure was canceled due to his recently discovered iron deficiency anemia. He reports claudication-like symptoms in his legs bilaterally. Some numbness and paresthesias also noted laterally. He is currently on gabapentin.  He has an upcoming GI appointment. No current appointment scheduled with vascular surgery at this time.  ROS: He denies any fevers, chills, lightheadedness, dizziness, headache, chest pain, shortness of breath, nausea, vomiting, diarrhea, melena, hematochezia.   Objective: BP 142/88 mmHg  Pulse 87  Temp(Src) 98.6 F (37 C) (Oral)  Ht 5' 7.5" (1.715 m)  Wt 198 lb (89.812 kg)  BMI 30.54 kg/m2  SpO2 98% Gen: NAD, alert, cooperative, and pleasant. CV: RRR, no murmur Resp: CTAB, no wheezes, non-labored Abd: SNTND, BS present, no guarding or organomegaly, no bruits appreciated. Ext: +1 edema bilaterally, warm, bilateral calf tenderness L>R, dorsalis pedis and posterior tibialis pulses difficult to appreciate. Femoral pulses present bilaterally. Cap refill ~1sec bilateral LE Neuro: Alert and oriented, Speech clear, No gross deficits  Assessment and plan:  PAD (peripheral artery disease) Patient continues to experience symptoms of peripheral neuropathy and claudication. My hope is that he will be able to be rescheduled for vascular surgery in the near future. - I have increased his gabapentin dosage from 2400 mg daily to 3600 mg daily. Patient understands that this is the maximum dosage for this medication. - Norco prescription refilled. - CBC obtained today.    Orders Placed This Encounter  Procedures  . CBC    Meds ordered this encounter  Medications  .  gabapentin (NEURONTIN) 800 MG tablet    Sig: Take 1.5 tablets (1,200 mg total) by mouth 3 (three) times daily.    Dispense:  135 tablet    Refill:  3  . HYDROcodone-acetaminophen (NORCO/VICODIN) 5-325 MG tablet    Sig: Take 1 tablet by mouth 2 (two) times daily as needed for moderate pain.    Dispense:  60 tablet    Refill:  0     Elberta Leatherwood, MD,MS,  PGY2 10/20/2015 7:14 PM

## 2015-10-20 NOTE — Patient Instructions (Signed)
It was a pleasure seeing you today in our clinic. Today we discussed your foot pain. Here is the treatment plan we have discussed and agreed upon together:   - Increase your gabapentin dose to 1.5 tablets (1200mg ) 3 times a day. - I have refilled your hydrocodone prescription. As we discussed in the clinic this can only be done with a face-to-face visit in this clinic, so if you need refills in the future please schedule an appointment. - Please call your vascular surgeon to schedule an appointment and hopefully reschedule yourself for surgery.

## 2015-10-20 NOTE — Assessment & Plan Note (Signed)
Patient continues to experience symptoms of peripheral neuropathy and claudication. My hope is that he will be able to be rescheduled for vascular surgery in the near future. - I have increased his gabapentin dosage from 2400 mg daily to 3600 mg daily. Patient understands that this is the maximum dosage for this medication. - Norco prescription refilled. - CBC obtained today.

## 2015-10-21 LAB — CBC
HCT: 36.9 % — ABNORMAL LOW (ref 39.0–52.0)
Hemoglobin: 11.1 g/dL — ABNORMAL LOW (ref 13.0–17.0)
MCH: 21.7 pg — ABNORMAL LOW (ref 26.0–34.0)
MCHC: 30.1 g/dL (ref 30.0–36.0)
MCV: 72.2 fL — ABNORMAL LOW (ref 78.0–100.0)
Platelets: 250 10*3/uL (ref 150–400)
RBC: 5.11 MIL/uL (ref 4.22–5.81)
WBC: 6.3 10*3/uL (ref 4.0–10.5)

## 2015-10-27 ENCOUNTER — Encounter: Payer: Self-pay | Admitting: Internal Medicine

## 2015-11-12 DIAGNOSIS — Z86010 Personal history of colonic polyps: Secondary | ICD-10-CM | POA: Diagnosis not present

## 2015-11-12 DIAGNOSIS — D509 Iron deficiency anemia, unspecified: Secondary | ICD-10-CM | POA: Diagnosis not present

## 2015-11-12 DIAGNOSIS — K219 Gastro-esophageal reflux disease without esophagitis: Secondary | ICD-10-CM | POA: Diagnosis not present

## 2015-11-24 ENCOUNTER — Encounter (HOSPITAL_COMMUNITY): Payer: Self-pay

## 2015-11-24 ENCOUNTER — Encounter (HOSPITAL_COMMUNITY)
Admission: RE | Disposition: A | Discharge status: Home / Self Care | Payer: Self-pay | Source: Ambulatory Visit | Attending: Gastroenterology

## 2015-11-24 ENCOUNTER — Ambulatory Visit (HOSPITAL_COMMUNITY)
Admission: RE | Admit: 2015-11-24 | Discharge: 2015-11-24 | Disposition: A | Discharge status: Home / Self Care | Payer: Medicare Other | Source: Ambulatory Visit | Attending: Gastroenterology | Admitting: Gastroenterology

## 2015-11-24 DIAGNOSIS — D509 Iron deficiency anemia, unspecified: Secondary | ICD-10-CM | POA: Diagnosis not present

## 2015-11-24 DIAGNOSIS — Q2733 Arteriovenous malformation of digestive system vessel: Secondary | ICD-10-CM | POA: Diagnosis not present

## 2015-11-24 HISTORY — PX: GIVENS CAPSULE STUDY: SHX5432

## 2015-11-24 SURGERY — IMAGING PROCEDURE, GI TRACT, INTRALUMINAL, VIA CAPSULE
Anesthesia: LOCAL

## 2015-11-24 SURGICAL SUPPLY — 1 items: TOWEL COTTON PACK 4EA (MISCELLANEOUS) ×4 IMPLANT

## 2015-11-24 NOTE — OR Nursing (Signed)
Patient here for capsule endoscopy. Ingested 12hr pill at 0810 without difficulty, to return recorder and leads tomorrow a.m for download.

## 2015-11-25 ENCOUNTER — Encounter (HOSPITAL_COMMUNITY): Payer: Self-pay | Admitting: Gastroenterology

## 2015-12-08 ENCOUNTER — Other Ambulatory Visit: Payer: Self-pay | Admitting: Family Medicine

## 2015-12-10 ENCOUNTER — Other Ambulatory Visit: Payer: Self-pay | Admitting: Gastroenterology

## 2015-12-10 ENCOUNTER — Encounter (HOSPITAL_COMMUNITY): Payer: Self-pay | Admitting: *Deleted

## 2015-12-10 NOTE — Progress Notes (Signed)
Pt denies SOB, chest pain, and being under the care of a cardiologist. Dr. Benson Norway advised that pt hold Pletal and Plavix the morning of procedure.

## 2015-12-11 ENCOUNTER — Ambulatory Visit (HOSPITAL_COMMUNITY): Payer: Medicare Other | Admitting: Certified Registered Nurse Anesthetist

## 2015-12-11 ENCOUNTER — Encounter (HOSPITAL_COMMUNITY): Payer: Self-pay

## 2015-12-11 ENCOUNTER — Encounter (HOSPITAL_COMMUNITY)
Admission: RE | Disposition: A | Discharge status: Home / Self Care | Payer: Self-pay | Source: Ambulatory Visit | Attending: Gastroenterology

## 2015-12-11 ENCOUNTER — Ambulatory Visit (HOSPITAL_COMMUNITY)
Admission: RE | Admit: 2015-12-11 | Discharge: 2015-12-11 | Disposition: A | Discharge status: Home / Self Care | Payer: Medicare Other | Source: Ambulatory Visit | Attending: Gastroenterology | Admitting: Gastroenterology

## 2015-12-11 DIAGNOSIS — E119 Type 2 diabetes mellitus without complications: Secondary | ICD-10-CM | POA: Diagnosis not present

## 2015-12-11 DIAGNOSIS — K219 Gastro-esophageal reflux disease without esophagitis: Secondary | ICD-10-CM | POA: Insufficient documentation

## 2015-12-11 DIAGNOSIS — K259 Gastric ulcer, unspecified as acute or chronic, without hemorrhage or perforation: Secondary | ICD-10-CM | POA: Diagnosis not present

## 2015-12-11 DIAGNOSIS — M1990 Unspecified osteoarthritis, unspecified site: Secondary | ICD-10-CM | POA: Diagnosis not present

## 2015-12-11 DIAGNOSIS — E669 Obesity, unspecified: Secondary | ICD-10-CM | POA: Diagnosis not present

## 2015-12-11 DIAGNOSIS — Z7902 Long term (current) use of antithrombotics/antiplatelets: Secondary | ICD-10-CM | POA: Diagnosis not present

## 2015-12-11 DIAGNOSIS — K31819 Angiodysplasia of stomach and duodenum without bleeding: Secondary | ICD-10-CM | POA: Diagnosis not present

## 2015-12-11 DIAGNOSIS — I10 Essential (primary) hypertension: Secondary | ICD-10-CM | POA: Diagnosis not present

## 2015-12-11 DIAGNOSIS — Q2733 Arteriovenous malformation of digestive system vessel: Secondary | ICD-10-CM | POA: Diagnosis not present

## 2015-12-11 DIAGNOSIS — K31811 Angiodysplasia of stomach and duodenum with bleeding: Secondary | ICD-10-CM | POA: Diagnosis not present

## 2015-12-11 DIAGNOSIS — D649 Anemia, unspecified: Secondary | ICD-10-CM | POA: Diagnosis not present

## 2015-12-11 DIAGNOSIS — E7800 Pure hypercholesterolemia, unspecified: Secondary | ICD-10-CM | POA: Diagnosis not present

## 2015-12-11 DIAGNOSIS — I739 Peripheral vascular disease, unspecified: Secondary | ICD-10-CM | POA: Diagnosis not present

## 2015-12-11 DIAGNOSIS — D509 Iron deficiency anemia, unspecified: Secondary | ICD-10-CM | POA: Diagnosis not present

## 2015-12-11 DIAGNOSIS — Z791 Long term (current) use of non-steroidal anti-inflammatories (NSAID): Secondary | ICD-10-CM | POA: Diagnosis not present

## 2015-12-11 DIAGNOSIS — Z6830 Body mass index (BMI) 30.0-30.9, adult: Secondary | ICD-10-CM | POA: Diagnosis not present

## 2015-12-11 DIAGNOSIS — F17200 Nicotine dependence, unspecified, uncomplicated: Secondary | ICD-10-CM | POA: Diagnosis not present

## 2015-12-11 DIAGNOSIS — Z7984 Long term (current) use of oral hypoglycemic drugs: Secondary | ICD-10-CM | POA: Diagnosis not present

## 2015-12-11 DIAGNOSIS — N529 Male erectile dysfunction, unspecified: Secondary | ICD-10-CM | POA: Insufficient documentation

## 2015-12-11 HISTORY — DX: Pneumonia, unspecified organism: J18.9

## 2015-12-11 HISTORY — PX: ENTEROSCOPY: SHX5533

## 2015-12-11 LAB — GLUCOSE, CAPILLARY: Glucose-Capillary: 98 mg/dL (ref 65–99)

## 2015-12-11 SURGERY — ENTEROSCOPY
Anesthesia: Monitor Anesthesia Care

## 2015-12-11 MED ORDER — SPOT INK MARKER SYRINGE KIT
PACK | SUBMUCOSAL | Status: AC
  Filled 2015-12-11: qty 5

## 2015-12-11 MED ORDER — PROPOFOL 10 MG/ML IV BOLUS
INTRAVENOUS | Status: DC | PRN
  Administered 2015-12-11 (×3): 30 mg via INTRAVENOUS
  Administered 2015-12-11: 40 mg via INTRAVENOUS

## 2015-12-11 MED ORDER — ESMOLOL HCL 100 MG/10ML IV SOLN
INTRAVENOUS | Status: DC | PRN
  Administered 2015-12-11: 20 mg via INTRAVENOUS
  Administered 2015-12-11 (×2): 10 mg via INTRAVENOUS

## 2015-12-11 MED ORDER — GLUCAGON HCL RDNA (DIAGNOSTIC) 1 MG IJ SOLR
INTRAMUSCULAR | Status: DC | PRN
Start: 2015-12-11 — End: 2015-12-11
  Administered 2015-12-11: .5 mg via INTRAVENOUS

## 2015-12-11 MED ORDER — SODIUM CHLORIDE 0.9 % IV SOLN: INTRAVENOUS | Status: DC

## 2015-12-11 MED ORDER — SPOT INK MARKER SYRINGE KIT
PACK | SUBMUCOSAL | Status: DC | PRN
  Administered 2015-12-11: 4 mL via SUBMUCOSAL

## 2015-12-11 MED ORDER — GLYCOPYRROLATE 0.2 MG/ML IJ SOLN
INTRAMUSCULAR | Status: DC | PRN
  Administered 2015-12-11: 0.1 mg via INTRAVENOUS

## 2015-12-11 MED ORDER — BUTAMBEN-TETRACAINE-BENZOCAINE 2-2-14 % EX AERO
INHALATION_SPRAY | CUTANEOUS | Status: DC | PRN
  Administered 2015-12-11: 2 via TOPICAL

## 2015-12-11 MED ORDER — FENTANYL CITRATE (PF) 250 MCG/5ML IJ SOLN
INTRAMUSCULAR | Status: DC | PRN
  Administered 2015-12-11: 25 ug via INTRAVENOUS
  Administered 2015-12-11: 50 ug via INTRAVENOUS

## 2015-12-11 MED ORDER — LACTATED RINGERS IV SOLN
INTRAVENOUS | Status: DC
  Administered 2015-12-11: 1000 mL via INTRAVENOUS
  Administered 2015-12-11: 14:00:00 via INTRAVENOUS

## 2015-12-11 MED ORDER — PROPOFOL 500 MG/50ML IV EMUL
INTRAVENOUS | Status: DC | PRN
  Administered 2015-12-11: 60 ug/kg/min via INTRAVENOUS

## 2015-12-11 NOTE — Op Note (Signed)
[facility] [place], [address]   ENTEROSCOPY PROCEDURE REPORT     EXAM DATE: 12/11/2015  PATIENT NAME:      Brian, [name]           MR #:      [id number]  BIRTHDATE:       Apr 01, 1951      VISIT #:     [id number]  ATTENDING:     Carol Ada, MD     STATUS:     outpatient ASSISTANT:      Cherylynn Ridges and Alinda Sierras MD: ASA CLASS:        Class III  INDICATIONS:  The patient is a 64 yr old male here for an enteroscopy procedure due to anemia. PROCEDURE PERFORMED:     Small bowel enteroscopy with control of bleeding  MEDICATIONS:     Monitored anesthesia care  CONSENT: The patient understands the risks and benefits of the procedure and understands that these risks include, but are not limited to: sedation, allergic reaction, infection, perforation and/or bleeding. Alternative means of evaluation and treatment include, among others: physical exam, x-rays, and/or surgical intervention. The patient elects to proceed with this endoscopic procedure.  DESCRIPTION OF PROCEDURE: During intra-op preparation period all mechanical & medical equipment was checked for proper function. Hand hygiene and appropriate measures for infection prevention was taken. After the risks, benefits and alternatives of the procedure were thoroughly explained, Informed consent was verified, confirmed and timeout was successfully executed by the treatment team. The    endoscope was introduced through the mouth and advanced to the proximal jejunum jejunum. The prep was The overall prep quality was excellent.. The instrument was then slowly withdrawn while examining the mucosa circumferentially. The scope was then completely withdrawn from the patient and the procedure terminated. The pulse, BP, and O2 saturation were monitored and documented by the physician and the nursing staff throughout the entire procedure.  The patient was cared for as  planned according to standard protocol, then discharged to recovery in stable condition and with appropriate post procedure care. Estimated blood loss is zero unless otherwise noted in this procedure report.  FINDINGS: In the gastric antrum a healing ulcer or an erosion was identified.  Deep intubation of the small bowel was achieved with the pediatric colonoscope.  The visualized small bowel was examined three times.  A couple of nonbleeding small AVMs were ablated with APC in the jejunum (Image 008).  In the second portion of the duodenum fresh blood was identified.  Palpation of the area with the APC probe resulted in more bleeding (Image 009).  Washing the area did not reveal an overt source of bleeding, but another random palpation of the area produced bleeding.  The area was then cauterized with APC.  The treatment did not precipitated any further bleeding.  Because no clear source for the bleeding was identified, the area was marked with SPOT in case further bleeding/recurrent anemia occured.Marland Kitchen    ADVERSE EVENTS:      There were no immediate complications.  IMPRESSIONS:     1) Small bowel AVMs. 2) Healing antral ulcer versus antral erosion - nonbleeding.   RECOMMENDATIONS:     1) Follow HGB. 2) Transfuse as necessary and repeat EGD/Enteroscopy, if warranted. RECALL:  _____________________________ Carol Ada, MD eSigned:  Carol Ada, MD 12/11/2015 3:00 PM   cc:     PATIENT NAME:  Brian, [name] MR#: [id number]

## 2015-12-11 NOTE — Anesthesia Preprocedure Evaluation (Addendum)
Anesthesia Evaluation  Patient identified by MRN, date of birth, ID band Patient awake    Reviewed: Allergy & Precautions, NPO status , Patient's Chart, lab work & pertinent test results, reviewed documented beta blocker date and time   History of Anesthesia Complications Negative for: history of anesthetic complications  Airway Mallampati: IV  TM Distance: >3 FB Neck ROM: Full    Dental  (+) Dental Advisory Given, Edentulous Upper, Edentulous Lower   Pulmonary Current Smoker,    Pulmonary exam normal breath sounds clear to auscultation       Cardiovascular hypertension, Pt. on medications and Pt. on home beta blockers (-) angina+ Peripheral Vascular Disease  (-) Past MI Normal cardiovascular exam Rhythm:Regular Rate:Normal     Neuro/Psych  Neuromuscular disease negative psych ROS   GI/Hepatic Neg liver ROS, GERD  Medicated,  Endo/Other  diabetes, Type 2, Oral Hypoglycemic Agents  Renal/GU negative Renal ROS     Musculoskeletal  (+) Arthritis ,   Abdominal   Peds  Hematology  (+) Blood dyscrasia, anemia ,   Anesthesia Other Findings Day of surgery medications reviewed with the patient.  Reproductive/Obstetrics                            Anesthesia Physical Anesthesia Plan  ASA: III  Anesthesia Plan: MAC   Post-op Pain Management:    Induction: Intravenous  Airway Management Planned: Nasal Cannula  Additional Equipment:   Intra-op Plan:   Post-operative Plan:   Informed Consent: I have reviewed the patients History and Physical, chart, labs and discussed the procedure including the risks, benefits and alternatives for the proposed anesthesia with the patient or authorized representative who has indicated his/her understanding and acceptance.   Dental advisory given  Plan Discussed with: CRNA and Anesthesiologist  Anesthesia Plan Comments: (Discussed  risks/benefits/alternatives to MAC sedation including need for ventilatory support, hypotension, need for conversion to general anesthesia.  All patient questions answered.  Patient wished to proceed.)        Anesthesia Quick Evaluation

## 2015-12-11 NOTE — Interval H&P Note (Signed)
History and Physical Interval Note:  12/11/2015 1:01 PM  [name]  has presented today for surgery, with the diagnosis of anemia  The various methods of treatment have been discussed with the patient and family. After consideration of risks, benefits and other options for treatment, the patient has consented to  Procedure(s): ENTEROSCOPY (N/A) as a surgical intervention .  The patient's history has been reviewed, patient examined, no change in status, stable for surgery.  I have reviewed the patient's chart and labs.  Questions were answered to the patient's satisfaction.    The patient was noted to be persistent anemic in the office by Dr. Collene Mares.  Further evaluation with an enteroscopy will be pursued.  He is currently on Plavix, and this may help isolate any bleeding sites.  Chrisie Jankovich D

## 2015-12-11 NOTE — Anesthesia Postprocedure Evaluation (Signed)
Anesthesia Post Note  Patient: [name]  Procedure(s) Performed: Procedure(s) (LRB): ENTEROSCOPY (N/A)  Patient location during evaluation: PACU Anesthesia Type: MAC Level of consciousness: awake and alert, awake and oriented Pain management: pain level controlled Vital Signs Assessment: post-procedure vital signs reviewed and stable Respiratory status: spontaneous breathing, nonlabored ventilation, respiratory function stable and patient connected to nasal cannula oxygen Cardiovascular status: stable and blood pressure returned to baseline Anesthetic complications: no    Last Vitals:  Filed Vitals:   12/11/15 1520 12/11/15 1530  BP: 109/85 153/85  Pulse: 79 70  Temp:    Resp: 16 14    Last Pain:  Filed Vitals:   12/11/15 1614  PainSc: [place] Edward Laurene Melendrez

## 2015-12-11 NOTE — Transfer of Care (Signed)
Immediate Anesthesia Transfer of Care Note  Patient: [name]  Procedure(s) Performed: Procedure(s): ENTEROSCOPY (N/A)  Patient Location: PACU  Anesthesia Type:MAC  Level of Consciousness: awake  Airway & Oxygen Therapy: Patient Spontanous Breathing  Post-op Assessment: Report given to RN and Post -op Vital signs reviewed and stable  Post vital signs: stable  Last Vitals:  Filed Vitals:   12/11/15 1248  BP: 179/87  Pulse: 68  Temp: 36.6 C  Resp: 15    Complications: No apparent anesthesia complications

## 2015-12-11 NOTE — H&P (View-Only) (Signed)
Pt denies SOB, chest pain, and being under the care of a cardiologist. Dr. Benson Norway advised that pt hold Pletal and Plavix the morning of procedure.

## 2015-12-14 ENCOUNTER — Encounter (HOSPITAL_COMMUNITY): Payer: Self-pay | Admitting: Gastroenterology

## 2015-12-25 ENCOUNTER — Emergency Department (HOSPITAL_COMMUNITY)
Admission: EM | Admit: 2015-12-25 | Discharge: 2015-12-26 | Disposition: A | Discharge status: Home / Self Care | Payer: Medicare Other | Attending: Emergency Medicine | Admitting: Emergency Medicine

## 2015-12-25 ENCOUNTER — Telehealth: Payer: Self-pay | Admitting: *Deleted

## 2015-12-25 ENCOUNTER — Encounter (HOSPITAL_COMMUNITY): Payer: Self-pay | Admitting: Emergency Medicine

## 2015-12-25 DIAGNOSIS — M79605 Pain in left leg: Secondary | ICD-10-CM | POA: Diagnosis not present

## 2015-12-25 DIAGNOSIS — M1990 Unspecified osteoarthritis, unspecified site: Secondary | ICD-10-CM | POA: Diagnosis not present

## 2015-12-25 DIAGNOSIS — Z79899 Other long term (current) drug therapy: Secondary | ICD-10-CM | POA: Insufficient documentation

## 2015-12-25 DIAGNOSIS — Z8701 Personal history of pneumonia (recurrent): Secondary | ICD-10-CM | POA: Diagnosis not present

## 2015-12-25 DIAGNOSIS — G8929 Other chronic pain: Secondary | ICD-10-CM | POA: Insufficient documentation

## 2015-12-25 DIAGNOSIS — K219 Gastro-esophageal reflux disease without esophagitis: Secondary | ICD-10-CM | POA: Diagnosis not present

## 2015-12-25 DIAGNOSIS — I10 Essential (primary) hypertension: Secondary | ICD-10-CM | POA: Diagnosis not present

## 2015-12-25 DIAGNOSIS — E785 Hyperlipidemia, unspecified: Secondary | ICD-10-CM | POA: Diagnosis not present

## 2015-12-25 DIAGNOSIS — D649 Anemia, unspecified: Secondary | ICD-10-CM | POA: Diagnosis not present

## 2015-12-25 DIAGNOSIS — Z7982 Long term (current) use of aspirin: Secondary | ICD-10-CM | POA: Diagnosis not present

## 2015-12-25 DIAGNOSIS — E119 Type 2 diabetes mellitus without complications: Secondary | ICD-10-CM | POA: Diagnosis not present

## 2015-12-25 DIAGNOSIS — Z8781 Personal history of (healed) traumatic fracture: Secondary | ICD-10-CM | POA: Insufficient documentation

## 2015-12-25 DIAGNOSIS — F17210 Nicotine dependence, cigarettes, uncomplicated: Secondary | ICD-10-CM | POA: Insufficient documentation

## 2015-12-25 DIAGNOSIS — R1030 Lower abdominal pain, unspecified: Secondary | ICD-10-CM | POA: Diagnosis not present

## 2015-12-25 DIAGNOSIS — I739 Peripheral vascular disease, unspecified: Secondary | ICD-10-CM

## 2015-12-25 LAB — COMPREHENSIVE METABOLIC PANEL
ALT: 16 U/L — ABNORMAL LOW (ref 17–63)
AST: 19 U/L (ref 15–41)
Albumin: 3.6 g/dL (ref 3.5–5.0)
Alkaline Phosphatase: 44 U/L (ref 38–126)
Anion gap: 9 (ref 5–15)
BUN: 10 mg/dL (ref 6–20)
CO2: 24 mmol/L (ref 22–32)
Calcium: 8.9 mg/dL (ref 8.9–10.3)
Chloride: 106 mmol/L (ref 101–111)
Creatinine, Ser: 0.84 mg/dL (ref 0.61–1.24)
GFR calc Af Amer: >60 mL/min (ref >60)
GFR calc non Af Amer: >60 mL/min (ref >60)
Glucose, Bld: 150 mg/dL — ABNORMAL HIGH (ref 65–99)
Potassium: 3.7 mmol/L (ref 3.5–5.1)
Sodium: 139 mmol/L (ref 135–145)
Total Bilirubin: 0.4 mg/dL (ref 0.3–1.2)
Total Protein: 6.4 g/dL — ABNORMAL LOW (ref 6.5–8.1)

## 2015-12-25 LAB — CBC WITH DIFFERENTIAL/PLATELET
Basophils Absolute: 0 10*3/uL (ref 0.0–0.1)
Basophils Relative: 1 %
Eosinophils Absolute: 0.2 10*3/uL (ref 0.0–0.7)
Eosinophils Relative: 4 %
HCT: 30.7 % — ABNORMAL LOW (ref 39.0–52.0)
Hemoglobin: 9.6 g/dL — ABNORMAL LOW (ref 13.0–17.0)
Lymphocytes Relative: 43 %
Lymphs Abs: 2.2 10*3/uL (ref 0.7–4.0)
MCH: 24.4 pg — ABNORMAL LOW (ref 26.0–34.0)
MCHC: 31.3 g/dL (ref 30.0–36.0)
MCV: 78.1 fL (ref 78.0–100.0)
Monocytes Absolute: 0.5 10*3/uL (ref 0.1–1.0)
Monocytes Relative: 11 %
Neutro Abs: 2.1 10*3/uL (ref 1.7–7.7)
Neutrophils Relative %: 41 %
Platelets: 318 10*3/uL (ref 150–400)
RBC: 3.93 MIL/uL — ABNORMAL LOW (ref 4.22–5.81)
RDW: 16.5 % — ABNORMAL HIGH (ref 11.5–15.5)
WBC: 5.1 10*3/uL (ref 4.0–10.5)

## 2015-12-25 LAB — CBG MONITORING, ED: Glucose-Capillary: 85 mg/dL (ref 65–99)

## 2015-12-25 MED ORDER — HYDROCODONE-ACETAMINOPHEN 5-325 MG PO TABS: 1.0000 | ORAL_TABLET | Freq: Four times a day (QID) | ORAL | Status: DC | PRN

## 2015-12-25 MED ORDER — MORPHINE SULFATE (PF) 4 MG/ML IV SOLN
4.0000 mg | Freq: Once | INTRAVENOUS | Status: AC
  Administered 2015-12-25: 4 mg via INTRAVENOUS
  Filled 2015-12-25: qty 1

## 2015-12-25 MED ORDER — HYDROCODONE-ACETAMINOPHEN 5-325 MG PO TABS
1.0000 | ORAL_TABLET | Freq: Once | ORAL | Status: AC
  Administered 2015-12-25: 1 via ORAL
  Filled 2015-12-25 (×2): qty 1

## 2015-12-25 NOTE — ED Notes (Signed)
Pt from home for eval of left groin pain, denies any testicular pain at this time. Pt denies any penile discharge. Pt also reports left leg pain but states has appt with PCP for that. Minor reddness noted to pt left great toe and pt reports pain. Pulses present. Ambulatory.

## 2015-12-25 NOTE — ED Provider Notes (Signed)
comPlanes of left groin pain radiating to left foot since yesterday. Patient reports he's had "burning pain in his left leg for several weeks. Pain is worse with walking and improved with rest. Pain is presently 8-9 on a scale of on  1-10. On exam patient in no distress. Bilateral lower extremities of normal temperature, hairless. Femoral pulses DP pulses and PT pulses obtainable by Doppler only 11:05 PM waiting on consult from vascular surgery 1130 pmDr.Chen evaluated patient in the ED and being unsafe for discharge. He will get vascular surgery next week. Dr. Bridgett Larsson suggests prescription Norco, to which I will write for.  Results for orders placed or performed during the hospital encounter of 12/25/15  Comprehensive metabolic panel  Result Value Ref Range   Sodium 139 135 - 145 mmol/L   Potassium 3.7 3.5 - 5.1 mmol/L   Chloride 106 101 - 111 mmol/L   CO2 24 22 - 32 mmol/L   Glucose, Bld 150 (H) 65 - 99 mg/dL   BUN 10 6 - 20 mg/dL   Creatinine, Ser 0.84 0.61 - 1.24 mg/dL   Calcium 8.9 8.9 - 10.3 mg/dL   Total Protein 6.4 (L) 6.5 - 8.1 g/dL   Albumin 3.6 3.5 - 5.0 g/dL   AST 19 15 - 41 U/L   ALT 16 (L) 17 - 63 U/L   Alkaline Phosphatase 44 38 - 126 U/L   Total Bilirubin 0.4 0.3 - 1.2 mg/dL   GFR calc non Af Amer >60 >60 mL/min   GFR calc Af Amer >60 >60 mL/min   Anion gap 9 5 - 15  CBC with Differential/Platelet  Result Value Ref Range   WBC 5.1 4.0 - 10.5 K/uL   RBC 3.93 (L) 4.22 - 5.81 MIL/uL   Hemoglobin 9.6 (L) 13.0 - 17.0 g/dL   HCT 30.7 (L) 39.0 - 52.0 %   MCV 78.1 78.0 - 100.0 fL   MCH 24.4 (L) 26.0 - 34.0 pg   MCHC 31.3 30.0 - 36.0 g/dL   RDW 16.5 (H) 11.5 - 15.5 %   Platelets 318 150 - 400 K/uL   Neutrophils Relative % 41 %   Neutro Abs 2.1 1.7 - 7.7 K/uL   Lymphocytes Relative 43 %   Lymphs Abs 2.2 0.7 - 4.0 K/uL   Monocytes Relative 11 %   Monocytes Absolute 0.5 0.1 - 1.0 K/uL   Eosinophils Relative 4 %   Eosinophils Absolute 0.2 0.0 - 0.7 K/uL   Basophils Relative  1 %   Basophils Absolute 0.0 0.0 - 0.1 K/uL  CBG monitoring, ED  Result Value Ref Range   Glucose-Capillary 85 65 - 99 mg/dL   No results found. Dx #1Claudication of left lower extremity #3 hyperglycemia #2 anemia  Orlie Dakin, MD 12/25/15 [phone]

## 2015-12-25 NOTE — ED Notes (Signed)
Pt sts he had surgery approx 2 weeks ago for GI bleeding.  Pt sts "the bleeding was fixed".  Pt denies injury to his left leg/hip.  Pt points to pain in the left groin/leg.  Patient states he has had issues with "blood flow to that leg".  Pt sts he has been taking it easy since his surgery.

## 2015-12-25 NOTE — ED Provider Notes (Signed)
CSN: [id number]     Arrival date & time 12/25/15  1142 History   First MD Initiated Contact with Patient 12/25/15 1615     Chief Complaint  Patient presents with  . Groin Pain  . Leg Pain     (Consider location/radiation/quality/duration/timing/severity/associated sxs/prior Treatment) HPI   The patient has a PMH of diabetes, GERD, PAD, chronic pain, anemia and GI bleed. He had an angiogram performed by Dr. Bridgett Larsson on 07/30/2015 which showed severe claudication and left leg limiting him to 25 or 30 feet or less due to severe symptoms in his leg. He was supposed to have surgery but unfortunately due to his anemia had to be canceled. He also just 2 weeks ago had surgery for a GI bleed. He is supposed to be reevaluated by a vascular surgeon again as soon as possible. The patient comes to the ER because his pain is so severe in the left leg that he is unable to tolerate it at home. He denies having coldness to his feet, decreased sensation or being unable to move his feet. He does endorse severe pain and some mild toe redness.  PCP: Georges Lynch, MD  [name] is a 64 y.o.  male  ROS: The patient denies diaphoresis, fever, headache, weakness (general or focal), confusion, change of vision,  dysphagia, aphagia, shortness of breath,  abdominal pains, nausea, vomiting, diarrhea, rash, neck pain, chest pain   Past Medical History  Diagnosis Date  . Diabetes mellitus   . GERD (gastroesophageal reflux disease)   . Hyperlipidemia   . Hypertension   . Arthritis     OA  . Cervical radiculopathy     Dr. Vertell Limber neurosurgery  . PAD (peripheral artery disease) ([place])   . Tobacco user   . Chronic lower back pain   . Toe fracture, right 05/09/2011  . Shortness of breath dyspnea     with exertion  . Anemia   . History of blood transfusion     "related to low HgB" ((09/10/2015  . Pneumonia    Past Surgical History  Procedure Laterality Date  . Anterior cervical decomp/discectomy fusion  03/08/12     C6-7  . Vascular surgery  ~ 2007    Stent SFA   . Back surgery  1996  . Lumbar disc surgery  1996    "lower"  . Inguinal hernia repair  1990's    right  . Anterior cervical decomp/discectomy fusion  03/08/2012    Procedure: ANTERIOR CERVICAL DECOMPRESSION/DISCECTOMY FUSION 1 LEVEL/HARDWARE REMOVAL;  Surgeon: Erline Levine, MD;  Location: [facility];  Service: Neurosurgery;  Laterality: N/A;  revison of C5-7 anterior cervical decompression with fusion with Cervical Five-Thoracic One anterior cervical decompression with fusion with interbody prothesis plating and bonegraft  . Colonoscopy w/ biopsies and polypectomy  08/17/2012    f/u 5 years, 4 polyps, no high grade dysplasia or malignancy, tubular adenoma, hyperplastic polyops  . Esophagogastroduodenoscopy  08/17/2012    normal esophagus and GEJ, diffuse gastritis with erythema- no malignancy, reactive gastropathy  with focal intestinal metaplasia  . Peripheral vascular catheterization N/A 07/30/2015    Procedure: Abdominal Aortogram;  Surgeon: Conrad Waubay, MD;  Location: [facility];  Service: Cardiovascular;  Laterality: N/A;  . Lower extremity angiogram Bilateral 07/30/2015    Procedure: Lower Extremity Angiogram;  Surgeon: Conrad [place], MD;  Location: [facility];  Service: Cardiovascular;  Laterality: Bilateral;  . Givens capsule study N/A 11/24/2015    Procedure: GIVENS  CAPSULE STUDY;  Surgeon: Juanita Craver, MD;  Location: [facility];  Service: Endoscopy;  Laterality: N/A;  . Enteroscopy N/A 12/11/2015    Procedure: ENTEROSCOPY;  Surgeon: Carol Ada, MD;  Location: [facility];  Service: Endoscopy;  Laterality: N/A;   Family History  Problem Relation Age of Onset  . Cancer Mother     colon Cancer  . Heart disease Father   . Hypertension Father    Social History  Substance Use Topics  . Smoking status: Current Some Day Smoker -- 24 years    Types: Cigars  . Smokeless tobacco: Never Used  . Alcohol Use: 7.8  oz/week    7 Cans of beer, 6 Shots of liquor per week    Review of Systems  Review of Systems All other systems negative except as documented in the HPI. All pertinent positives and negatives as reviewed in the HPI.   Allergies  Glipizide  Home Medications   Prior to Admission medications   Medication Sig Start Date End Date Taking? Authorizing Provider  acetaminophen (TYLENOL) 500 MG tablet Take 1,000 mg by mouth every 6 (six) hours as needed for moderate pain.   Yes Historical Provider, MD  aspirin EC 81 MG tablet Take 162 mg by mouth daily.   Yes Historical Provider, MD  benazepril (LOTENSIN) 5 MG tablet Take 1 tablet (5 mg total) by mouth daily. 09/22/15  Yes Elberta Leatherwood, MD  cilostazol (PLETAL) 100 MG tablet Take 100 mg by mouth 2 (two) times daily. 08/09/15  Yes Historical Provider, MD  clopidogrel (PLAVIX) 75 MG tablet TAKE 1 TABLET EVERY DAY 12/08/15  Yes Elberta Leatherwood, MD  ezetimibe-simvastatin (VYTORIN) 10-40 MG per tablet Take 1 tablet by mouth every morning. 08/11/15  Yes Elberta Leatherwood, MD  gabapentin (NEURONTIN) 800 MG tablet Take 1.5 tablets (1,200 mg total) by mouth 3 (three) times daily. 10/20/15  Yes Elberta Leatherwood, MD  metFORMIN (GLUCOPHAGE) 500 MG tablet Take 500 mg by mouth daily.   Yes Historical Provider, MD  pantoprazole (PROTONIX) 40 MG tablet TAKE 1 TABLET BY MOUTH EVERY DAY Patient taking differently: Take 40 mg by mouth every day 04/10/15  Yes Timmothy Euler, MD  cephALEXin (KEFLEX) 500 MG capsule Take 1 capsule (500 mg total) by mouth 4 (four) times daily. Patient not taking: Reported on 12/25/2015 09/15/15   Elberta Leatherwood, MD  docusate sodium (COLACE) 100 MG capsule Take 1 capsule (100 mg total) by mouth 2 (two) times daily. Patient not taking: Reported on 12/25/2015 09/11/15   Nicolette Bang, DO  ferrous sulfate (FERROUSUL) 325 (65 FE) MG tablet Take 1 tablet (325 mg total) by mouth daily with breakfast. Patient not taking: Reported on 12/25/2015  09/11/15   Nicolette Bang, DO  HYDROcodone-acetaminophen (NORCO/VICODIN) 5-325 MG tablet Take 1 tablet by mouth 2 (two) times daily as needed for moderate pain. Patient not taking: Reported on 12/25/2015 10/20/15   Elberta Leatherwood, MD  Nebivolol HCl (BYSTOLIC) 20 MG TABS Take 1 tablet (20 mg total) by mouth daily. Patient not taking: Reported on 12/25/2015 10/21/13   Timmothy Euler, MD   BP 143/84 mmHg  Pulse 65  Temp(Src) 98 F (36.7 C) (Oral)  Resp 16  Ht 5\' 7"  (1.702 m)  Wt 89.812 kg  BMI 31.00 kg/m2  SpO2 100% Physical Exam  Constitutional: He appears well-developed and well-nourished. No distress.  HENT:  Head: Normocephalic and atraumatic.  Right Ear: Tympanic membrane and ear canal normal.  Left  Ear: Tympanic membrane and ear canal normal.  Nose: Nose normal.  Mouth/Throat: Uvula is midline, oropharynx is clear and moist and mucous membranes are normal.  Eyes: Pupils are equal, round, and reactive to light.  Neck: Normal range of motion. Neck supple.  Cardiovascular: Normal rate and regular rhythm.   Pulmonary/Chest: Effort normal.  Abdominal: Soft.  No signs of abdominal distention  Musculoskeletal:  No LE swelling. Distal tibial pulses are able to be appreciated using bedside doppler bilaterally. He has CR to all toes with sensation of pain to palpation. He has intact motor function. To all 5 toes and ankle.  Neurological: He is alert.  Acting at baseline  Skin: Skin is warm and dry. No rash noted.  Nursing note and vitals reviewed.   ED Course  Procedures (including critical care time) Labs Review Labs Reviewed  COMPREHENSIVE METABOLIC PANEL - Abnormal; Notable for the following:    Glucose, Bld 150 (*)    Total Protein 6.4 (*)    ALT 16 (*)    All other components within normal limits  CBC WITH DIFFERENTIAL/PLATELET - Abnormal; Notable for the following:    RBC 3.93 (*)    Hemoglobin 9.6 (*)    HCT 30.7 (*)    MCH 24.4 (*)    RDW 16.5 (*)    All  other components within normal limits    Imaging Review No results found. I have personally reviewed and evaluated these images and lab results as part of my medical decision-making.   EKG Interpretation None      MDM   Final diagnoses:  None   Dr. Bridgett Larsson consulted at 5: 16 pm and advises that as long as the patient has Motor and Sensation Function and no signs of threat to limb that he can be seen as an outpatient on Tuesday and will not require consultation within the ER today.  6: 30 pm Discussed this with Dr. Winfred Leeds who asked that I call him back and request for him to be evaluated to day due to concern of claudication at rest. Dr. Bridgett Larsson was agreeable. He is currently in surgery and him or a PA will be down as soon as they can. Pts pain controlled in ED and vitals remain stable.  7: 30 pm Dr. Bridgett Larsson estimated he will be two more hours before he is able to see patient.  At end of shift patient care will be assumed by Dr. Winfred Leeds, we are still waiting for vascular surgery to evaluate.  Delos Haring, PA-C 12/25/15 2056  Orlie Dakin, MD 12/26/15 [phone]

## 2015-12-25 NOTE — Consult Note (Signed)
Established Intermittent Claudication  History of Present Illness  [name] is a 64 y.o. ([birth year]) male who presents with chief complaint: left leg pain.  The patient's symptoms have not progressed.  The patient's symptoms are: severe short distance intermittent claudication.  The patient's treatment regimen currently included: maximal medical management.  He was originally scheduled for a Left femoropopliteal bypass with Dr. Kellie Simmering in September but the patient developed GI bleeding, so this was canceled.  This patient denies any rest pain, anesthesia, or motor loss at this point.    Angiogram done by myself on 07/30/15: demonstrated:      Occluded bilateral superficial femoral artery stents  Essentially single vessel runoff in right leg: peroneal artery  Left below-the-knee popliteal artery appears adequate for bypass target     The patient's PMH, PSH, and SH, and FamHx are unchanged from 07/30/15.  No current facility-administered medications for this encounter.   Current Outpatient Prescriptions  Medication Sig Dispense Refill  . acetaminophen (TYLENOL) 500 MG tablet Take 1,000 mg by mouth every 6 (six) hours as needed for moderate pain.    Marland Kitchen aspirin EC 81 MG tablet Take 162 mg by mouth daily.    . benazepril (LOTENSIN) 5 MG tablet Take 1 tablet (5 mg total) by mouth daily. 30 tablet 5  . cilostazol (PLETAL) 100 MG tablet Take 100 mg by mouth 2 (two) times daily.  11  . clopidogrel (PLAVIX) 75 MG tablet TAKE 1 TABLET EVERY DAY 90 tablet 3  . ezetimibe-simvastatin (VYTORIN) 10-40 MG per tablet Take 1 tablet by mouth every morning. 30 tablet 11  . gabapentin (NEURONTIN) 800 MG tablet Take 1.5 tablets (1,200 mg total) by mouth 3 (three) times daily. 135 tablet 3  . metFORMIN (GLUCOPHAGE) 500 MG tablet Take 500 mg by mouth daily.    . pantoprazole (PROTONIX) 40 MG tablet TAKE 1 TABLET BY MOUTH EVERY DAY (Patient taking differently: Take 40 mg by mouth every day) 30 tablet 11    . cephALEXin (KEFLEX) 500 MG capsule Take 1 capsule (500 mg total) by mouth 4 (four) times daily. (Patient not taking: Reported on 12/25/2015) 40 capsule 0  . docusate sodium (COLACE) 100 MG capsule Take 1 capsule (100 mg total) by mouth 2 (two) times daily. (Patient not taking: Reported on 12/25/2015) 60 capsule 1  . ferrous sulfate (FERROUSUL) 325 (65 FE) MG tablet Take 1 tablet (325 mg total) by mouth daily with breakfast. (Patient not taking: Reported on 12/25/2015) 30 tablet 1  . HYDROcodone-acetaminophen (NORCO/VICODIN) 5-325 MG tablet Take 1 tablet by mouth 2 (two) times daily as needed for moderate pain. (Patient not taking: Reported on 12/25/2015) 60 tablet 0  . Nebivolol HCl (BYSTOLIC) 20 MG TABS Take 1 tablet (20 mg total) by mouth daily. (Patient not taking: Reported on 12/25/2015) 30 tablet 11    Allergies  Allergen Reactions  . Glipizide Other (See Comments)    Severe hypoglycemia to 40s.     On ROS today: no rest pain, no gangrene or ulcer.   Physical Examination  Filed Vitals:   12/25/15 2215 12/25/15 2230 12/25/15 2245 12/25/15 2300  BP: 111/89 133/75 141/80 144/69  Pulse: 71 68 67 66  Temp:      TempSrc:      Resp:      Height:      Weight:      SpO2: 100% 99% 100% 100%   Body mass index is 31 kg/(m^2).  General: A&O x 3, WD, Obese,  Pulmonary: Sym exp, good air movt, CTAB, no rales, rhonchi, & wheezing  Cardiac: RRR, Nl S1, S2, no Murmurs, rubs or gallops  Vascular: Vessel Right Left  Radial Faintly palpable Not Palpable  Brachial Palpable Palpable  Carotid Palpable, without bruit Palpable, without bruit  Aorta Not palpable N/A  Femoral Palpable Palpable  Popliteal Not palpable Not palpable  PT Not Palpable Not Palpable  DP Not Palpable Not Palpable   Gastrointestinal: soft, NTND, no G/R, no HSM, no masses, no CVAT B  Musculoskeletal: M/S 5/5 throughout including bilateral DF/PF, Extremities without ischemic changes , rubor in left foot, no  ulcers or frank gangrenous changes  Neurologic: Pain and light touch intact in extremities including feet, Motor exam as listed above   Medical Decision Making  [name] is a 64 y.o. male who presents with:  bilateral (L>R) leg intermittent claudication without evidence of critical limb ischemia or threatened limb.   Based on the patient's vascular studies and examination, I have offered the patient: Left femoropopliteal bypass (CFA to BK pop BPG) in next 1-2 weeks.  No emergency surgery is necessary as this patient does not have a threatened limb.  Will schedule with next available surgeon in the next 1-2 weeks if Dr. Kellie Simmering not available.  I discussed in depth with the patient the nature of atherosclerosis, and emphasized the importance of maximal medical management including strict control of blood pressure, blood glucose, and lipid levels, antiplatelet agents, obtaining regular exercise, and cessation of smoking.    The patient is aware that without maximal medical management the underlying atherosclerotic disease process will progress, limiting the benefit of any interventions. The patient is currently on a statin: Vytorin  The patient is currently on an anti-platelet: ASA.  Thank you for allowing [place] to participate in this patient's care.   Adele Barthel, MD [facility] Office: [phone] Pager: [phone]  12/25/2015, 11:30 PM

## 2015-12-25 NOTE — Telephone Encounter (Signed)
Wife called stating that patient was having severe left leg and left groin pain.  Describing it as severe,stabbing and cramping pain. States that the left foot appears to be cold and somewhat discolored.  Denies any fever. Patient had bypass surgery scheduled in September 2016 and had to cancel due to intestinal bleeding.  Advised patient to [facility] ED

## 2015-12-25 NOTE — ED Notes (Signed)
Pt given [place] and water

## 2015-12-25 NOTE — ED Notes (Signed)
Dr. Chen at bedside.

## 2015-12-25 NOTE — ED Notes (Signed)
Vascular at bedside

## 2015-12-25 NOTE — ED Notes (Signed)
Pt getting dressed.

## 2015-12-25 NOTE — ED Notes (Signed)
Pt sts he is supposed to be having surgery to "fix the blood flow" in his left leg.

## 2015-12-25 NOTE — Discharge Instructions (Signed)
Dr. Lianne Moris office will call you next week to schedule an office visit and surgery for the poor circulation of your leg. If you don't hear from the office by Tuesday, December 29, 2015, call to schedule the office appointment. Tell office staff that Dr. Bridgett Larsson saw you in the emergency department and that you need to be seen and evaluated for surgery. Take Tylenol for mild pain with a pain medicine prescribed for bad pain. Don't take Tylenol together with the pain medicine prescribed as the combination can be dangerous to your liver.

## 2015-12-29 ENCOUNTER — Other Ambulatory Visit: Payer: Self-pay

## 2015-12-29 ENCOUNTER — Ambulatory Visit (INDEPENDENT_AMBULATORY_CARE_PROVIDER_SITE_OTHER): Payer: Medicare Other | Admitting: Family Medicine

## 2015-12-29 ENCOUNTER — Encounter: Payer: Self-pay | Admitting: Family Medicine

## 2015-12-29 VITALS — BP 114/61 | HR 94 | Temp 98.2°F | Ht 67.0 in | Wt 195.8 lb

## 2015-12-29 DIAGNOSIS — I739 Peripheral vascular disease, unspecified: Secondary | ICD-10-CM | POA: Diagnosis present

## 2015-12-29 NOTE — Assessment & Plan Note (Signed)
Pain in left>right foot, known severe PAD, to be scheduled for bypass/revascularization next week, seen in ED with vascular consult over the weekend - continue vytorin, pletal, asa, plavix per neuro recs - schedule dm f/u later this month with PCP as dm control will be essential to maintain bypass graft.

## 2015-12-29 NOTE — Progress Notes (Signed)
   Subjective:   [name] is a 65 y.o. male with a history of pad, dm, htn, anemia here for f/u PAD  Pt presents for ED f/u. Seen over the weekend for foot pain, had vascular consult with recommendation for non-emergent surgery in the next 1-2 weeks. Claudication not thought to be limb threatening at this point. Reports pain is unchanged. Worse with activity but present even while seated. Worst in dorsal foot, very tender. He is elevating and taking his vicodin but no other treatments. Says he can't do compresses due to severe tenderness to touch  Review of Systems:  Per HPI. All other systems reviewed and are negative.   PMH, PSH, Medications, Allergies, and FmHx reviewed and updated in EMR.  Social History: current smoker  Objective:  BP 114/61 mmHg  Pulse 94  Temp(Src) 98.2 F (36.8 C) (Oral)  Ht 5\' 7"  (1.702 m)  Wt 195 lb 12.8 oz (88.814 kg)  BMI 30.66 kg/m2  Gen:  65 y.o. male in NAD HEENT: NCAT, MMM, EOMI, PERRL, anicteric sclerae CV: RRR, no MRG, no JVD Resp: Non-labored, CTAB, no wheezes noted Abd: Soft, NTND, BS present, no guarding or organomegaly Ext: WWP, no edema MSK: Full ROM, strength intact Neuro: Alert and oriented, speech normal      Chemistry      Component Value Date/Time   NA 139 12/25/2015 1833   K 3.7 12/25/2015 1833   CL 106 12/25/2015 1833   CO2 24 12/25/2015 1833   BUN 10 12/25/2015 1833   CREATININE 0.84 12/25/2015 1833   CREATININE 0.94 03/31/2015 1454      Component Value Date/Time   CALCIUM 8.9 12/25/2015 1833   ALKPHOS 44 12/25/2015 1833   AST 19 12/25/2015 1833   ALT 16* 12/25/2015 1833   BILITOT 0.4 12/25/2015 1833      Lab Results  Component Value Date   WBC 5.1 12/25/2015   HGB 9.6* 12/25/2015   HCT 30.7* 12/25/2015   MCV 78.1 12/25/2015   PLT 318 12/25/2015   No results found for: TSH Lab Results  Component Value Date   HGBA1C 7.0* 09/08/2015   Assessment & Plan:     [name] is a 65 y.o. male here for  PAD  PAD (peripheral artery disease) Pain in left>right foot, known severe PAD, to be scheduled for bypass/revascularization next week, seen in ED with vascular consult over the weekend - continue vytorin, pletal, asa, plavix per neuro recs - schedule dm f/u later this month with PCP as dm control will be essential to maintain bypass graft.    Beverlyn Roux, MD, MPH [facility] PGY-3 12/29/2015 1:29 PM

## 2015-12-30 ENCOUNTER — Telehealth: Payer: Self-pay | Admitting: Family Medicine

## 2015-12-30 NOTE — Telephone Encounter (Signed)
FYI to MD

## 2015-12-30 NOTE — Telephone Encounter (Signed)
Same day surgery called and wanted the doctor to know that they have advised the patient to stop taking Plavix for 5 days before his surgery. So as of tomorrow the patient will not be taking Plavix until after the surgery. Please call them at [phone]. Blima Rich

## 2015-12-31 ENCOUNTER — Encounter (HOSPITAL_COMMUNITY): Payer: Self-pay

## 2015-12-31 ENCOUNTER — Encounter (HOSPITAL_COMMUNITY)
Admission: RE | Admit: 2015-12-31 | Discharge: 2015-12-31 | Disposition: A | Discharge status: Home / Self Care | Payer: Medicare Other | Source: Ambulatory Visit | Attending: Vascular Surgery | Admitting: Vascular Surgery

## 2015-12-31 DIAGNOSIS — I739 Peripheral vascular disease, unspecified: Secondary | ICD-10-CM | POA: Insufficient documentation

## 2015-12-31 DIAGNOSIS — E119 Type 2 diabetes mellitus without complications: Secondary | ICD-10-CM | POA: Diagnosis not present

## 2015-12-31 DIAGNOSIS — F17200 Nicotine dependence, unspecified, uncomplicated: Secondary | ICD-10-CM | POA: Diagnosis not present

## 2015-12-31 DIAGNOSIS — Z01818 Encounter for other preprocedural examination: Secondary | ICD-10-CM | POA: Diagnosis not present

## 2015-12-31 DIAGNOSIS — Z7982 Long term (current) use of aspirin: Secondary | ICD-10-CM | POA: Insufficient documentation

## 2015-12-31 DIAGNOSIS — E785 Hyperlipidemia, unspecified: Secondary | ICD-10-CM | POA: Diagnosis not present

## 2015-12-31 DIAGNOSIS — Z7984 Long term (current) use of oral hypoglycemic drugs: Secondary | ICD-10-CM | POA: Diagnosis not present

## 2015-12-31 DIAGNOSIS — I10 Essential (primary) hypertension: Secondary | ICD-10-CM | POA: Diagnosis not present

## 2015-12-31 DIAGNOSIS — Z7902 Long term (current) use of antithrombotics/antiplatelets: Secondary | ICD-10-CM | POA: Insufficient documentation

## 2015-12-31 DIAGNOSIS — Z01812 Encounter for preprocedural laboratory examination: Secondary | ICD-10-CM | POA: Diagnosis not present

## 2015-12-31 DIAGNOSIS — D649 Anemia, unspecified: Secondary | ICD-10-CM | POA: Diagnosis not present

## 2015-12-31 DIAGNOSIS — Z79899 Other long term (current) drug therapy: Secondary | ICD-10-CM | POA: Diagnosis not present

## 2015-12-31 DIAGNOSIS — K219 Gastro-esophageal reflux disease without esophagitis: Secondary | ICD-10-CM | POA: Diagnosis not present

## 2015-12-31 LAB — URINE MICROSCOPIC-ADD ON: RBC / HPF: NONE SEEN RBC/hpf (ref 0–5)

## 2015-12-31 LAB — CBC
HCT: 31.5 % — ABNORMAL LOW (ref 39.0–52.0)
Hemoglobin: 9.7 g/dL — ABNORMAL LOW (ref 13.0–17.0)
MCH: 24 pg — ABNORMAL LOW (ref 26.0–34.0)
MCHC: 30.8 g/dL (ref 30.0–36.0)
MCV: 77.8 fL — ABNORMAL LOW (ref 78.0–100.0)
Platelets: 331 10*3/uL (ref 150–400)
RBC: 4.05 MIL/uL — ABNORMAL LOW (ref 4.22–5.81)
RDW: 15.9 % — ABNORMAL HIGH (ref 11.5–15.5)
WBC: 6.9 10*3/uL (ref 4.0–10.5)

## 2015-12-31 LAB — COMPREHENSIVE METABOLIC PANEL
ALT: 13 U/L — ABNORMAL LOW (ref 17–63)
AST: 16 U/L (ref 15–41)
Albumin: 3.6 g/dL (ref 3.5–5.0)
Alkaline Phosphatase: 48 U/L (ref 38–126)
Anion gap: 10 (ref 5–15)
BUN: 12 mg/dL (ref 6–20)
CO2: 23 mmol/L (ref 22–32)
Calcium: 9.1 mg/dL (ref 8.9–10.3)
Chloride: 107 mmol/L (ref 101–111)
Creatinine, Ser: 0.93 mg/dL (ref 0.61–1.24)
GFR calc Af Amer: >60 mL/min (ref >60)
GFR calc non Af Amer: >60 mL/min (ref >60)
Glucose, Bld: 108 mg/dL — ABNORMAL HIGH (ref 65–99)
Potassium: 3.9 mmol/L (ref 3.5–5.1)
Sodium: 140 mmol/L (ref 135–145)
Total Bilirubin: 0.6 mg/dL (ref 0.3–1.2)
Total Protein: 6.5 g/dL (ref 6.5–8.1)

## 2015-12-31 LAB — URINALYSIS, ROUTINE W REFLEX MICROSCOPIC
Bilirubin Urine: NEGATIVE
Glucose, UA: NEGATIVE mg/dL
Hgb urine dipstick: NEGATIVE
Ketones, ur: NEGATIVE mg/dL
Nitrite: NEGATIVE
Protein, ur: NEGATIVE mg/dL
Specific Gravity, Urine: 1.025 (ref 1.005–1.030)
pH: 5 (ref 5.0–8.0)

## 2015-12-31 LAB — SURGICAL PCR SCREEN
MRSA, PCR: NEGATIVE
Staphylococcus aureus: NEGATIVE

## 2015-12-31 LAB — PROTIME-INR
INR: 1.11 (ref 0.00–1.49)
Prothrombin Time: 14.5 seconds (ref 11.6–15.2)

## 2015-12-31 LAB — GLUCOSE, CAPILLARY: Glucose-Capillary: 112 mg/dL — ABNORMAL HIGH (ref 65–99)

## 2015-12-31 LAB — APTT: aPTT: 29 seconds (ref 24–37)

## 2015-12-31 MED ORDER — CHLORHEXIDINE GLUCONATE CLOTH 2 % EX PADS: 6.0000 | MEDICATED_PAD | Freq: Once | CUTANEOUS | Status: DC

## 2015-12-31 NOTE — Progress Notes (Signed)
Per pt he was instructed by surgeon to hold his Plavix 5 days prior to surgery.

## 2015-12-31 NOTE — Progress Notes (Addendum)
Saw Dr.Ganji  > 5 yrs ago-required no further follow up per pt  Medical Md is Dr.Ian McKeag  Echo denies  Stress test > 5 yrs ago  Heart cath report in epic from 2013  EKG in epic from 07-30-15  CXR denies in past yr

## 2015-12-31 NOTE — Pre-Procedure Instructions (Signed)
[name]  12/31/2015      [id number] [name], [place] [id number]  [place] [id number] Phone: [phone] Fax: [phone]    Your procedure is scheduled on Wed, Jan 11 @ 8:30 AM  Report to [facility] Admitting at 6:30 AM  Call this number if you have problems the morning of surgery:  [phone]   Remember:  Do not eat food or drink liquids after midnight.  Take these medicines the morning of surgery with A SIP OF WATER Gabapentin(Neurontin),Pain Pill(if needed),Bystolic(Nebivolol),and Pantoprazole(Protonix)              No Goody's,BC's,Aleve,Ibuprofen,Fish Oil,Motrin,Advil,or any Herbal Medications.    Do not wear jewelry.  Do not wear lotions, powders, or colognes.  You may wear deodorant.  Men may shave face and neck.  Do not bring valuables to the hospital.  [facility] is not responsible for any belongings or valuables.  Contacts, dentures or bridgework may not be worn into surgery.  Leave your suitcase in the car.  After surgery it may be brought to your room.  For patients admitted to the hospital, discharge time will be determined by your treatment team.  Patients discharged the day of surgery will not be allowed to drive home.    Special instructions:  [facility] - Preparing for Surgery  Before surgery, you can play an important role.  Because skin is not sterile, your skin needs to be as free of germs as possible.  You can reduce the number of germs on you skin by washing with CHG (chlorahexidine gluconate) soap before surgery.  CHG is an antiseptic cleaner which kills germs and bonds with the skin to continue killing germs even after washing.  Please DO NOT use if you have an allergy to CHG or antibacterial soaps.  If your skin becomes reddened/irritated stop using the CHG and inform your nurse when you arrive at Short Stay.  Do not shave (including legs and  underarms) for at least 48 hours prior to the first CHG shower.  You may shave your face.  Please follow these instructions carefully:   1.  Shower with CHG Soap the night before surgery and the                                morning of Surgery.  2.  If you choose to wash your hair, wash your hair first as usual with your       normal shampoo.  3.  After you shampoo, rinse your hair and body thoroughly to remove the                      Shampoo.  4.  Use CHG as you would any other liquid soap.  You can apply chg directly       to the skin and wash gently with scrungie or a clean washcloth.  5.  Apply the CHG Soap to your body ONLY FROM THE NECK DOWN.        Do not use on open wounds or open sores.  Avoid contact with your eyes,       ears, mouth and genitals (private parts).  Wash genitals (private parts)       with your normal soap.  6.  Wash thoroughly, paying special attention to the area where  your surgery        will be performed.  7.  Thoroughly rinse your body with warm water from the neck down.  8.  DO NOT shower/wash with your normal soap after using and rinsing off       the CHG Soap.  9.  Pat yourself dry with a clean towel.            10.  Wear clean pajamas.            11.  Place clean sheets on your bed the night of your first shower and do not        sleep with pets.  Day of Surgery  Do not apply any lotions/deoderants the morning of surgery.  Please wear clean clothes to the hospital/surgery center.    Please read over the following fact sheets that you were given. Pain Booklet, Coughing and Deep Breathing, Blood Transfusion Information, MRSA Information and Surgical Site Infection Prevention

## 2016-01-01 ENCOUNTER — Telehealth: Payer: Self-pay | Admitting: Family Medicine

## 2016-01-01 LAB — HEMOGLOBIN A1C
Hgb A1c MFr Bld: 6 % — ABNORMAL HIGH (ref 4.8–5.6)
Mean Plasma Glucose: 126 mg/dL

## 2016-01-01 NOTE — Telephone Encounter (Signed)
Yes this is ok during the time specified.

## 2016-01-01 NOTE — Progress Notes (Signed)
Anesthesia Chart Review:  Pt is 65 year old male scheduled for L fem-pop bypass graft on 01/06/2016 with Dr. Kellie Simmering.   Pt was originally scheduled for this surgery 08/2015 but it was cancelled due to severe anemia (hgb 6). Had endoscopy 12/11/15 (see media tab). Dx Small bowel AVMs, healing antral ulcer versus antral erosion - nonbleeding.RECOMMENDATIONS: 1) Follow HGB. 2) Transfuse as necessary and repeat EGD/Enteroscopy, if warranted  PMH includes:  HTN, PAD, DM, hyperlipidemia, GERD. Current smoker. BMI 31. S/p ACDF 03/08/12.   Medications include: ASA, benazepril, plavix, vytorin, metformin, protonix. Pt to stop plavix 5 days before surgery.   Preoperative labs reviewed.  hgbA1c 6.0, glucose 108. H/H 9.7/31.5. Will get T&S DOS. Notified Stephanie in Dr. Evelena Leyden office.   EKG 07/30/15: NSR. Minimal voltage criteria for LVH, may be normal variant. Prolonged QT  If no changes, I anticipate pt can proceed with surgery as scheduled.   Willeen Cass, FNP-BC [facility] Short Stay Surgical Center/Anesthesiology Phone: [phone] 01/01/2016 2:47 PM

## 2016-01-01 NOTE — Telephone Encounter (Signed)
Stephanie from the vascular center is calling and states that the pt needed to stop taking his Plavix 5 days before a procedure and his last dose was yesterday. She is calling to ensure this is ok with PCP. Sadie Reynolds, ASA

## 2016-01-01 NOTE — Telephone Encounter (Signed)
Called office. Left message that this is ok from my standpoint to hold the plavix for 5 days prior to surgery.

## 2016-01-05 MED ORDER — SODIUM CHLORIDE 0.9 % IV SOLN: INTRAVENOUS | Status: DC

## 2016-01-05 MED ORDER — DEXTROSE 5 % IV SOLN
1.5000 g | INTRAVENOUS | Status: AC
  Administered 2016-01-06: 1.5 g via INTRAVENOUS
  Filled 2016-01-05: qty 1.5

## 2016-01-05 NOTE — Anesthesia Preprocedure Evaluation (Addendum)
Anesthesia Evaluation  Patient identified by MRN, date of birth, ID band Patient awake    Reviewed: Allergy & Precautions, NPO status , Patient's Chart, lab work & pertinent test results  Airway Mallampati: III  TM Distance: >3 FB Neck ROM: Full    Dental  (+) Upper Dentures, Lower Dentures   Pulmonary pneumonia, Current Smoker,     + decreased breath sounds+ wheezing      Cardiovascular hypertension, Pt. on medications + Peripheral Vascular Disease   Rhythm:Regular Rate:Normal     Neuro/Psych PSYCHIATRIC DISORDERS  Neuromuscular disease    GI/Hepatic Neg liver ROS, GERD  Medicated,  Endo/Other  diabetes, Type 2, Oral Hypoglycemic Agents  Renal/GU negative Renal ROS  negative genitourinary   Musculoskeletal   Abdominal   Peds  Hematology  (+) anemia ,   Anesthesia Other Findings - HLD   Reproductive/Obstetrics                            Lab Results  Component Value Date   WBC 6.9 12/31/2015   HGB 9.7* 12/31/2015   HCT 31.5* 12/31/2015   MCV 77.8* 12/31/2015   PLT 331 12/31/2015   Lab Results  Component Value Date   CREATININE 0.93 12/31/2015   BUN 12 12/31/2015   NA 140 12/31/2015   K 3.9 12/31/2015   CL 107 12/31/2015   CO2 23 12/31/2015   Lab Results  Component Value Date   INR 1.11 12/31/2015   INR 1.26 09/10/2015   INR 1.28 09/08/2015   07/2015 EKG: normal sinus rhythm, prolonged QT interval.    Anesthesia Physical Anesthesia Plan  ASA: III  Anesthesia Plan: General   Post-op Pain Management:    Induction: Intravenous  Airway Management Planned: Oral ETT  Additional Equipment: Arterial line  Intra-op Plan:   Post-operative Plan: Extubation in OR  Informed Consent: I have reviewed the patients History and Physical, chart, labs and discussed the procedure including the risks, benefits and alternatives for the proposed anesthesia with the patient or  authorized representative who has indicated his/her understanding and acceptance.   Dental advisory given  Plan Discussed with: CRNA  Anesthesia Plan Comments:         Anesthesia Quick Evaluation

## 2016-01-06 ENCOUNTER — Encounter (HOSPITAL_COMMUNITY)
Admission: RE | Disposition: A | Discharge status: Home / Self Care | Payer: Self-pay | Source: Ambulatory Visit | Attending: Vascular Surgery

## 2016-01-06 ENCOUNTER — Encounter (HOSPITAL_COMMUNITY): Payer: Self-pay | Admitting: *Deleted

## 2016-01-06 ENCOUNTER — Inpatient Hospital Stay (HOSPITAL_COMMUNITY): Payer: Medicare Other | Admitting: Emergency Medicine

## 2016-01-06 ENCOUNTER — Inpatient Hospital Stay (HOSPITAL_COMMUNITY)
Admission: RE | Admit: 2016-01-06 | Discharge: 2016-01-11 | DRG: 253 | Disposition: A | Discharge status: Home / Self Care | Payer: Medicare Other | Source: Ambulatory Visit | Attending: Vascular Surgery | Admitting: Vascular Surgery

## 2016-01-06 ENCOUNTER — Inpatient Hospital Stay (HOSPITAL_COMMUNITY): Payer: Medicare Other | Admitting: Anesthesiology

## 2016-01-06 ENCOUNTER — Inpatient Hospital Stay (HOSPITAL_COMMUNITY): Payer: Medicare Other

## 2016-01-06 DIAGNOSIS — M1990 Unspecified osteoarthritis, unspecified site: Secondary | ICD-10-CM | POA: Diagnosis not present

## 2016-01-06 DIAGNOSIS — Z888 Allergy status to other drugs, medicaments and biological substances status: Secondary | ICD-10-CM

## 2016-01-06 DIAGNOSIS — I70212 Atherosclerosis of native arteries of extremities with intermittent claudication, left leg: Secondary | ICD-10-CM | POA: Diagnosis not present

## 2016-01-06 DIAGNOSIS — M79605 Pain in left leg: Secondary | ICD-10-CM | POA: Diagnosis not present

## 2016-01-06 DIAGNOSIS — E785 Hyperlipidemia, unspecified: Secondary | ICD-10-CM | POA: Diagnosis present

## 2016-01-06 DIAGNOSIS — K219 Gastro-esophageal reflux disease without esophagitis: Secondary | ICD-10-CM | POA: Diagnosis present

## 2016-01-06 DIAGNOSIS — M545 Low back pain: Secondary | ICD-10-CM | POA: Diagnosis not present

## 2016-01-06 DIAGNOSIS — I70213 Atherosclerosis of native arteries of extremities with intermittent claudication, bilateral legs: Secondary | ICD-10-CM | POA: Diagnosis not present

## 2016-01-06 DIAGNOSIS — Z7982 Long term (current) use of aspirin: Secondary | ICD-10-CM

## 2016-01-06 DIAGNOSIS — D649 Anemia, unspecified: Secondary | ICD-10-CM | POA: Diagnosis present

## 2016-01-06 DIAGNOSIS — I10 Essential (primary) hypertension: Secondary | ICD-10-CM | POA: Diagnosis present

## 2016-01-06 DIAGNOSIS — Z7902 Long term (current) use of antithrombotics/antiplatelets: Secondary | ICD-10-CM

## 2016-01-06 DIAGNOSIS — Z95828 Presence of other vascular implants and grafts: Secondary | ICD-10-CM | POA: Diagnosis not present

## 2016-01-06 DIAGNOSIS — I739 Peripheral vascular disease, unspecified: Secondary | ICD-10-CM | POA: Diagnosis not present

## 2016-01-06 DIAGNOSIS — D62 Acute posthemorrhagic anemia: Secondary | ICD-10-CM | POA: Diagnosis not present

## 2016-01-06 DIAGNOSIS — Z419 Encounter for procedure for purposes other than remedying health state, unspecified: Secondary | ICD-10-CM

## 2016-01-06 DIAGNOSIS — E1151 Type 2 diabetes mellitus with diabetic peripheral angiopathy without gangrene: Principal | ICD-10-CM | POA: Diagnosis present

## 2016-01-06 DIAGNOSIS — I743 Embolism and thrombosis of arteries of the lower extremities: Secondary | ICD-10-CM | POA: Diagnosis present

## 2016-01-06 DIAGNOSIS — Z7984 Long term (current) use of oral hypoglycemic drugs: Secondary | ICD-10-CM

## 2016-01-06 DIAGNOSIS — I998 Other disorder of circulatory system: Secondary | ICD-10-CM | POA: Diagnosis not present

## 2016-01-06 HISTORY — PX: FEMORAL-POPLITEAL BYPASS GRAFT: SHX937

## 2016-01-06 HISTORY — PX: VEIN HARVEST: SHX6363

## 2016-01-06 HISTORY — PX: INTRAOPERATIVE ARTERIOGRAM: SHX5157

## 2016-01-06 LAB — GLUCOSE, CAPILLARY
Glucose-Capillary: 120 mg/dL — ABNORMAL HIGH (ref 65–99)
Glucose-Capillary: 120 mg/dL — ABNORMAL HIGH (ref 65–99)
Glucose-Capillary: 110 mg/dL — ABNORMAL HIGH (ref 65–99)
Glucose-Capillary: 98 mg/dL (ref 65–99)

## 2016-01-06 LAB — CBC
HCT: 28.5 % — ABNORMAL LOW (ref 39.0–52.0)
Hemoglobin: 8.8 g/dL — ABNORMAL LOW (ref 13.0–17.0)
MCH: 23.8 pg — ABNORMAL LOW (ref 26.0–34.0)
MCHC: 30.9 g/dL (ref 30.0–36.0)
MCV: 77.2 fL — ABNORMAL LOW (ref 78.0–100.0)
Platelets: 263 10*3/uL (ref 150–400)
RBC: 3.69 MIL/uL — ABNORMAL LOW (ref 4.22–5.81)
RDW: 15.9 % — ABNORMAL HIGH (ref 11.5–15.5)
WBC: 9.4 10*3/uL (ref 4.0–10.5)

## 2016-01-06 LAB — TYPE AND SCREEN
ABO/RH(D): A POS
Antibody Screen: NEGATIVE

## 2016-01-06 LAB — CREATININE, SERUM
Creatinine, Ser: 0.75 mg/dL (ref 0.61–1.24)
GFR calc Af Amer: >60 mL/min (ref >60)
GFR calc non Af Amer: >60 mL/min (ref >60)

## 2016-01-06 SURGERY — BYPASS GRAFT FEMORAL-POPLITEAL ARTERY
Anesthesia: General | Site: Leg Upper | Laterality: Left

## 2016-01-06 MED ORDER — PROPOFOL 10 MG/ML IV BOLUS
INTRAVENOUS | Status: AC
  Filled 2016-01-06: qty 20

## 2016-01-06 MED ORDER — HYDROCODONE-ACETAMINOPHEN 5-325 MG PO TABS
1.0000 | ORAL_TABLET | Freq: Four times a day (QID) | ORAL | Status: DC | PRN
  Administered 2016-01-06 – 2016-01-10 (×15): 2 via ORAL
  Filled 2016-01-06 (×14): qty 2

## 2016-01-06 MED ORDER — HEPARIN SODIUM (PORCINE) 1000 UNIT/ML IJ SOLN
INTRAMUSCULAR | Status: DC | PRN
  Administered 2016-01-06: 6000 [IU] via INTRAVENOUS

## 2016-01-06 MED ORDER — PHENYLEPHRINE 40 MCG/ML (10ML) SYRINGE FOR IV PUSH (FOR BLOOD PRESSURE SUPPORT)
PREFILLED_SYRINGE | INTRAVENOUS | Status: AC
  Filled 2016-01-06: qty 10

## 2016-01-06 MED ORDER — INSULIN ASPART 100 UNIT/ML ~~LOC~~ SOLN
0.0000 [IU] | Freq: Three times a day (TID) | SUBCUTANEOUS | Status: DC
  Administered 2016-01-07 – 2016-01-08 (×2): 2 [IU] via SUBCUTANEOUS
  Administered 2016-01-09: 3 [IU] via SUBCUTANEOUS
  Administered 2016-01-10: 2 [IU] via SUBCUTANEOUS
  Administered 2016-01-11: 3 [IU] via SUBCUTANEOUS

## 2016-01-06 MED ORDER — POTASSIUM CHLORIDE CRYS ER 20 MEQ PO TBCR: 20.0000 meq | EXTENDED_RELEASE_TABLET | Freq: Every day | ORAL | Status: DC | PRN

## 2016-01-06 MED ORDER — ONDANSETRON HCL 4 MG/2ML IJ SOLN
INTRAMUSCULAR | Status: DC | PRN
  Administered 2016-01-06: 4 mg via INTRAVENOUS

## 2016-01-06 MED ORDER — HYDROMORPHONE HCL 1 MG/ML IJ SOLN
0.2500 mg | INTRAMUSCULAR | Status: DC | PRN
  Administered 2016-01-06 (×4): 0.5 mg via INTRAVENOUS

## 2016-01-06 MED ORDER — BENAZEPRIL HCL 5 MG PO TABS
5.0000 mg | ORAL_TABLET | Freq: Every day | ORAL | Status: DC
  Administered 2016-01-07 – 2016-01-11 (×5): 5 mg via ORAL
  Filled 2016-01-06 (×5): qty 1

## 2016-01-06 MED ORDER — FENTANYL CITRATE (PF) 250 MCG/5ML IJ SOLN
INTRAMUSCULAR | Status: DC | PRN
  Administered 2016-01-06 (×2): 50 ug via INTRAVENOUS
  Administered 2016-01-06: 100 ug via INTRAVENOUS
  Administered 2016-01-06: 50 ug via INTRAVENOUS

## 2016-01-06 MED ORDER — LACTATED RINGERS IV SOLN
INTRAVENOUS | Status: DC | PRN
  Administered 2016-01-06 (×2): via INTRAVENOUS

## 2016-01-06 MED ORDER — ACETAMINOPHEN 325 MG PO TABS: 325.0000 mg | ORAL_TABLET | ORAL | Status: DC | PRN

## 2016-01-06 MED ORDER — PROPOFOL 10 MG/ML IV BOLUS
INTRAVENOUS | Status: DC | PRN
  Administered 2016-01-06: 200 mg via INTRAVENOUS
  Administered 2016-01-06 (×2): 40 mg via INTRAVENOUS

## 2016-01-06 MED ORDER — LACTATED RINGERS IV SOLN: INTRAVENOUS | Status: DC

## 2016-01-06 MED ORDER — PANTOPRAZOLE SODIUM 40 MG PO TBEC
40.0000 mg | DELAYED_RELEASE_TABLET | Freq: Every day | ORAL | Status: DC
  Administered 2016-01-06 – 2016-01-11 (×6): 40 mg via ORAL
  Filled 2016-01-06 (×6): qty 1

## 2016-01-06 MED ORDER — ALUM & MAG HYDROXIDE-SIMETH 200-200-20 MG/5ML PO SUSP: 15.0000 mL | ORAL | Status: DC | PRN

## 2016-01-06 MED ORDER — ROCURONIUM BROMIDE 50 MG/5ML IV SOLN
INTRAVENOUS | Status: AC
  Filled 2016-01-06: qty 1

## 2016-01-06 MED ORDER — HEPARIN SODIUM (PORCINE) 5000 UNIT/ML IJ SOLN
INTRAMUSCULAR | Status: DC | PRN
  Administered 2016-01-06: 500 mL

## 2016-01-06 MED ORDER — SODIUM CHLORIDE 0.9 % IV SOLN: 500.0000 mL | Freq: Once | INTRAVENOUS | Status: DC | PRN

## 2016-01-06 MED ORDER — PHENYLEPHRINE HCL 10 MG/ML IJ SOLN
10.0000 mg | INTRAVENOUS | Status: DC | PRN
  Administered 2016-01-06: 30 ug/min via INTRAVENOUS

## 2016-01-06 MED ORDER — ONDANSETRON HCL 4 MG/2ML IJ SOLN: 4.0000 mg | Freq: Four times a day (QID) | INTRAMUSCULAR | Status: DC | PRN

## 2016-01-06 MED ORDER — ASPIRIN EC 81 MG PO TBEC
162.0000 mg | DELAYED_RELEASE_TABLET | Freq: Every day | ORAL | Status: DC
  Administered 2016-01-07 – 2016-01-11 (×5): 162 mg via ORAL
  Filled 2016-01-06 (×5): qty 2

## 2016-01-06 MED ORDER — LIDOCAINE HCL (CARDIAC) 20 MG/ML IV SOLN
INTRAVENOUS | Status: AC
  Filled 2016-01-06: qty 5

## 2016-01-06 MED ORDER — SODIUM CHLORIDE 0.9 % IJ SOLN
INTRAMUSCULAR | Status: AC
  Filled 2016-01-06: qty 10

## 2016-01-06 MED ORDER — EPHEDRINE SULFATE 50 MG/ML IJ SOLN
INTRAMUSCULAR | Status: AC
  Filled 2016-01-06: qty 1

## 2016-01-06 MED ORDER — EZETIMIBE-SIMVASTATIN 10-40 MG PO TABS
1.0000 | ORAL_TABLET | Freq: Every morning | ORAL | Status: DC
  Administered 2016-01-07 – 2016-01-11 (×5): 1 via ORAL
  Filled 2016-01-06 (×5): qty 1

## 2016-01-06 MED ORDER — FENTANYL CITRATE (PF) 250 MCG/5ML IJ SOLN
INTRAMUSCULAR | Status: AC
  Filled 2016-01-06: qty 5

## 2016-01-06 MED ORDER — BISACODYL 10 MG RE SUPP
10.0000 mg | Freq: Every day | RECTAL | Status: DC | PRN
  Administered 2016-01-10: 10 mg via RECTAL
  Filled 2016-01-06: qty 1

## 2016-01-06 MED ORDER — METOPROLOL TARTRATE 1 MG/ML IV SOLN: 2.0000 mg | INTRAVENOUS | Status: DC | PRN

## 2016-01-06 MED ORDER — SUCCINYLCHOLINE CHLORIDE 20 MG/ML IJ SOLN
INTRAMUSCULAR | Status: AC
  Filled 2016-01-06: qty 1

## 2016-01-06 MED ORDER — PHENOL 1.4 % MT LIQD: 1.0000 | OROMUCOSAL | Status: DC | PRN

## 2016-01-06 MED ORDER — HYDRALAZINE HCL 20 MG/ML IJ SOLN: 5.0000 mg | INTRAMUSCULAR | Status: DC | PRN

## 2016-01-06 MED ORDER — HEPARIN SODIUM (PORCINE) 1000 UNIT/ML IJ SOLN
INTRAMUSCULAR | Status: AC
  Filled 2016-01-06: qty 1

## 2016-01-06 MED ORDER — MIDAZOLAM HCL 2 MG/2ML IJ SOLN
INTRAMUSCULAR | Status: AC
  Filled 2016-01-06: qty 2

## 2016-01-06 MED ORDER — MIDAZOLAM HCL 5 MG/5ML IJ SOLN
INTRAMUSCULAR | Status: DC | PRN
  Administered 2016-01-06: 2 mg via INTRAVENOUS

## 2016-01-06 MED ORDER — GABAPENTIN 600 MG PO TABS
1200.0000 mg | ORAL_TABLET | Freq: Three times a day (TID) | ORAL | Status: DC
  Administered 2016-01-06 – 2016-01-11 (×15): 1200 mg via ORAL
  Filled 2016-01-06 (×15): qty 2

## 2016-01-06 MED ORDER — IOHEXOL 300 MG/ML  SOLN
INTRAMUSCULAR | Status: DC | PRN
  Administered 2016-01-06: 50 mL via INTRA_ARTERIAL

## 2016-01-06 MED ORDER — FERROUS SULFATE 325 (65 FE) MG PO TABS
325.0000 mg | ORAL_TABLET | Freq: Every day | ORAL | Status: DC
  Administered 2016-01-07 – 2016-01-11 (×5): 325 mg via ORAL
  Filled 2016-01-06 (×5): qty 1

## 2016-01-06 MED ORDER — 0.9 % SODIUM CHLORIDE (POUR BTL) OPTIME
TOPICAL | Status: DC | PRN
  Administered 2016-01-06: 2000 mL
  Administered 2016-01-06: 1000 mL

## 2016-01-06 MED ORDER — MORPHINE SULFATE (PF) 2 MG/ML IV SOLN
2.0000 mg | INTRAVENOUS | Status: DC | PRN
  Administered 2016-01-06: 2 mg via INTRAVENOUS
  Administered 2016-01-06: 3 mg via INTRAVENOUS
  Administered 2016-01-06 – 2016-01-09 (×16): 2 mg via INTRAVENOUS
  Administered 2016-01-09: 3 mg via INTRAVENOUS
  Filled 2016-01-06 (×16): qty 1
  Filled 2016-01-06: qty 2
  Filled 2016-01-06 (×3): qty 1

## 2016-01-06 MED ORDER — PROMETHAZINE HCL 25 MG/ML IJ SOLN: 6.2500 mg | INTRAMUSCULAR | Status: DC | PRN

## 2016-01-06 MED ORDER — HYDROMORPHONE HCL 1 MG/ML IJ SOLN
INTRAMUSCULAR | Status: AC
  Administered 2016-01-06: 0.5 mg via INTRAVENOUS
  Filled 2016-01-06: qty 1

## 2016-01-06 MED ORDER — DOCUSATE SODIUM 100 MG PO CAPS
100.0000 mg | ORAL_CAPSULE | Freq: Two times a day (BID) | ORAL | Status: DC
  Administered 2016-01-06 – 2016-01-11 (×9): 100 mg via ORAL
  Filled 2016-01-06 (×10): qty 1

## 2016-01-06 MED ORDER — SODIUM CHLORIDE 0.9 % IV SOLN
INTRAVENOUS | Status: DC
  Administered 2016-01-06: 18:00:00 via INTRAVENOUS

## 2016-01-06 MED ORDER — ACETAMINOPHEN 650 MG RE SUPP: 325.0000 mg | RECTAL | Status: DC | PRN

## 2016-01-06 MED ORDER — MAGNESIUM HYDROXIDE 400 MG/5ML PO SUSP
30.0000 mL | Freq: Every day | ORAL | Status: DC | PRN
Start: 2016-01-06 — End: 2016-01-11

## 2016-01-06 MED ORDER — ROCURONIUM BROMIDE 100 MG/10ML IV SOLN
INTRAVENOUS | Status: DC | PRN
  Administered 2016-01-06: 50 mg via INTRAVENOUS

## 2016-01-06 MED ORDER — GUAIFENESIN-DM 100-10 MG/5ML PO SYRP: 15.0000 mL | ORAL_SOLUTION | ORAL | Status: DC | PRN

## 2016-01-06 MED ORDER — PHENYLEPHRINE HCL 10 MG/ML IJ SOLN
INTRAMUSCULAR | Status: DC | PRN
  Administered 2016-01-06 (×3): 80 ug via INTRAVENOUS
  Administered 2016-01-06: 40 ug via INTRAVENOUS
  Administered 2016-01-06: 80 ug via INTRAVENOUS

## 2016-01-06 MED ORDER — LABETALOL HCL 5 MG/ML IV SOLN
10.0000 mg | INTRAVENOUS | Status: DC | PRN
  Filled 2016-01-06: qty 4

## 2016-01-06 MED ORDER — DEXTROSE 5 % IV SOLN
1.5000 g | Freq: Two times a day (BID) | INTRAVENOUS | Status: AC
  Administered 2016-01-06 – 2016-01-07 (×2): 1.5 g via INTRAVENOUS
  Filled 2016-01-06 (×3): qty 1.5

## 2016-01-06 MED ORDER — HYDROCODONE-ACETAMINOPHEN 5-325 MG PO TABS
ORAL_TABLET | ORAL | Status: AC
  Filled 2016-01-06: qty 2

## 2016-01-06 MED ORDER — HEPARIN SODIUM (PORCINE) 5000 UNIT/ML IJ SOLN
5000.0000 [IU] | Freq: Three times a day (TID) | INTRAMUSCULAR | Status: DC
  Administered 2016-01-07 – 2016-01-11 (×13): 5000 [IU] via SUBCUTANEOUS
  Filled 2016-01-06 (×13): qty 1

## 2016-01-06 MED ORDER — LIDOCAINE HCL (CARDIAC) 20 MG/ML IV SOLN
INTRAVENOUS | Status: DC | PRN
  Administered 2016-01-06: 60 mg via INTRAVENOUS

## 2016-01-06 MED ORDER — CLOPIDOGREL BISULFATE 75 MG PO TABS
75.0000 mg | ORAL_TABLET | Freq: Every day | ORAL | Status: DC
  Administered 2016-01-07 – 2016-01-11 (×5): 75 mg via ORAL
  Filled 2016-01-06 (×5): qty 1

## 2016-01-06 MED ORDER — MEPERIDINE HCL 25 MG/ML IJ SOLN: 6.2500 mg | INTRAMUSCULAR | Status: DC | PRN

## 2016-01-06 SURGICAL SUPPLY — 51 items
BANDAGE ESMARK 6X9 LF (GAUZE/BANDAGES/DRESSINGS) IMPLANT
BNDG CMPR 9X6 STRL LF SNTH (GAUZE/BANDAGES/DRESSINGS)
BNDG ESMARK 6X9 LF (GAUZE/BANDAGES/DRESSINGS)
CANISTER SUCTION 2500CC (MISCELLANEOUS) ×4 IMPLANT
CLIP TI MEDIUM 24 (CLIP) ×4 IMPLANT
CLIP TI WIDE RED SMALL 24 (CLIP) ×5 IMPLANT
DECANTER SPIKE VIAL GLASS SM (MISCELLANEOUS) IMPLANT
DRAIN SNY 10X20 3/4 PERF (WOUND CARE) IMPLANT
DRAPE PROXIMA HALF (DRAPES) IMPLANT
DRAPE X-RAY CASS 24X20 (DRAPES) ×1 IMPLANT
ELECT REM PT RETURN 9FT ADLT (ELECTROSURGICAL) ×4
ELECTRODE REM PT RTRN 9FT ADLT (ELECTROSURGICAL) ×3 IMPLANT
EVACUATOR SILICONE 100CC (DRAIN) IMPLANT
GLOVE BIO SURGEON STRL SZ 6.5 (GLOVE) ×8 IMPLANT
GLOVE BIOGEL PI IND STRL 6.5 (GLOVE) IMPLANT
GLOVE BIOGEL PI INDICATOR 6.5 (GLOVE) ×3
GLOVE ECLIPSE 6.5 STRL STRAW (GLOVE) ×3 IMPLANT
GLOVE SS BIOGEL STRL SZ 7 (GLOVE) ×3 IMPLANT
GLOVE SS BIOGEL STRL SZ 7.5 (GLOVE) IMPLANT
GLOVE SUPERSENSE BIOGEL SZ 7 (GLOVE) ×1
GLOVE SUPERSENSE BIOGEL SZ 7.5 (GLOVE) ×1
GOWN STRL REUS W/ TWL LRG LVL3 (GOWN DISPOSABLE) ×9 IMPLANT
GOWN STRL REUS W/TWL LRG LVL3 (GOWN DISPOSABLE) ×28
INSERT FOGARTY SM (MISCELLANEOUS) ×4 IMPLANT
KIT BASIN OR (CUSTOM PROCEDURE TRAY) ×4 IMPLANT
KIT ROOM TURNOVER OR (KITS) ×4 IMPLANT
LIQUID BAND (GAUZE/BANDAGES/DRESSINGS) ×5 IMPLANT
NS IRRIG 1000ML POUR BTL (IV SOLUTION) ×8 IMPLANT
PACK PERIPHERAL VASCULAR (CUSTOM PROCEDURE TRAY) ×4 IMPLANT
PAD ARMBOARD 7.5X6 YLW CONV (MISCELLANEOUS) ×8 IMPLANT
PADDING CAST COTTON 6X4 STRL (CAST SUPPLIES) IMPLANT
SET COLLECT BLD 21X3/4 12 (NEEDLE) ×1 IMPLANT
SPONGE LAP 18X18 X RAY DECT (DISPOSABLE) ×2 IMPLANT
STOPCOCK 4 WAY LG BORE MALE ST (IV SETS) ×1 IMPLANT
SUT PROLENE 5 0 C1 (SUTURE) IMPLANT
SUT PROLENE 6 0 BV (SUTURE) IMPLANT
SUT PROLENE 6 0 CC (SUTURE) ×8 IMPLANT
SUT PROLENE 7 0 BV 1 (SUTURE) ×2 IMPLANT
SUT SILK 2 0 SH (SUTURE) ×3 IMPLANT
SUT SILK 3 0 (SUTURE)
SUT SILK 3-0 18XBRD TIE 12 (SUTURE) IMPLANT
SUT SILK 4 0 (SUTURE) ×4
SUT SILK 4-0 18XBRD TIE 12 (SUTURE) IMPLANT
SUT VIC AB 2-0 CTX 36 (SUTURE) ×8 IMPLANT
SUT VIC AB 3-0 SH 27 (SUTURE) ×12
SUT VIC AB 3-0 SH 27X BRD (SUTURE) ×6 IMPLANT
SUT VICRYL 4-0 PS2 18IN ABS (SUTURE) ×2 IMPLANT
TRAY FOLEY W/METER SILVER 16FR (SET/KITS/TRAYS/PACK) ×4 IMPLANT
TUBING EXTENTION W/L.L. (IV SETS) ×1 IMPLANT
UNDERPAD 30X30 INCONTINENT (UNDERPADS AND DIAPERS) ×4 IMPLANT
WATER STERILE IRR 1000ML POUR (IV SOLUTION) ×4 IMPLANT

## 2016-01-06 NOTE — Transfer of Care (Signed)
Immediate Anesthesia Transfer of Care Note  Patient: [name]  Procedure(s) Performed: Procedure(s): Left  COMMON FEMORAL-BELOW KNEE POPLITEAL ARTERY Bypass using non-reversed translocated saphenous vein graft from left leg (Left) INTRA OPERATIVE ARTERIOGRAM LEFT LOWER LEG (Left) LEFT GREATER SAPPHENOUS VEIN HARVEST (Left)  Patient Location: PACU  Anesthesia Type:General  Level of Consciousness: awake, alert , oriented and patient cooperative  Airway & Oxygen Therapy: Patient Spontanous Breathing and Patient connected to nasal cannula oxygen  Post-op Assessment: Report given to RN, Post -op Vital signs reviewed and stable and Patient moving all extremities  Post vital signs: Reviewed and stable  Last Vitals:  Filed Vitals:   01/06/16 0644  BP: 171/82  Pulse: 70  Temp: 36.5 C  Resp: 18    Complications: No apparent anesthesia complications

## 2016-01-06 NOTE — Interval H&P Note (Signed)
History and Physical Interval Note:  01/06/2016 8:01 AM  [name]  has presented today for surgery, with the diagnosis of Peripheral vascular disease with bilateral lower extremity claudication I70.213  The various methods of treatment have been discussed with the patient and family. After consideration of risks, benefits and other options for treatment, the patient has consented to  Procedure(s): BYPASS GRAFT COMMON FEMORAL-BELOW KNEE POPLITEAL ARTERY (Left) as a surgical intervention .  The patient's history has been reviewed, patient examined, no change in status, stable for surgery.  I have reviewed the patient's chart and labs.  Questions were answered to the patient's satisfaction.     [name]

## 2016-01-06 NOTE — Op Note (Signed)
OPERATIVE REPORT  Date of Surgery: 01/06/2016  Surgeon: [name], MD  Assistant: Donavan Burnet M.D., [facility]  [place]  Pre-op Diagnosis: Ischemic left leg with severe claudication secondary to superficial femoral, popliteal, and tibial occlusive disease  Post-op Diagnosis-same Procedure: Procedure(s): Left  COMMON FEMORAL-BELOW KNEE POPLITEAL ARTERY Bypass using non-reversed translocated saphenous vein graft from left leg INTRA OPERATIVE ARTERIOGRAM LEFT LOWER LEG  Anesthesia: General  EBL: [id number] cc  Complications: None  The patient was taken operating and placed in supine position at which time satisfactory general endotracheal anesthesia was administered. The left leg was prepped Betadine scrub and solution draped in routine sterile manner. Saphenous vein was exposed at saphenofemoral junction were then removed down to the mid calf through multiple incisions along the medial aspect of the left leg. The branches were ligated with 3 and 4-0 silk ties and divided. It was gently dilated with heparinized saline and marked for orientation purposes. There was a satisfactory vein being at least 2-1/2-3 mm in size throughout. The common superficial and profunda femoris arteries were dissected free and inguinal wound. The common femoral artery was soft anteriorly but had a large posterior plaque. The superficial femoral artery was concentrically calcified and there was disease extending down into the profunda as well. There is an excellent pulse in the common femoral. Through the medial vein harvesting incision below the knee the below-knee popliteal artery was exposed and encircled with Vesseloops. It was a diffusely diseased vessel as well but was patent on angiogram with two-vessel runoff through the posterior tib and peroneal arteries which were diseased. The anterior tibial artery was included on the angiogram. Subfascial anatomic tunnel was created and the patient was heparinized. Femoral vessels  occluded with vascular clamps lunch to the opening made in the common femoral artery with 15 blade extending with Potts scissors. Was excellent inflow. Proximal and the vein was spatulated and anastomosed inside with 6-0 Prolene. Clamps were released and was a good pulse in the first set of competent valves. Using a retrograde valvulotome the valves were rendered incompetent with resultant excellent flow out the distal end of the vein graft. Vein was carefully delivered through the tunnel being careful to preserve its orientation. Popliteal artery was occluded proximally and distally with vessel loops. Longitudinal opening made in the popliteal artery 15 blade extended with Potts scissors. It was a very diseased vessel but was about 2-1/2 mm in size in the luminal area distally although there was a posterior plaque which continued distal to the arthrotomy. Vein was carefully measured spatulated anastomosis inside with 6-0 Prolene. Clamps released and there was good pulse and excellent Doppler flow in the distal end of the vein graft and audible Doppler flow in the  posterior tibial and peroneal arteries at the ankle. Intraoperative arteriogram was then performed which revealed a widely patent vein graft into the below-knee popliteal artery with two-vessel runoff through the posterior tib and peroneal arteries with total occlusion of the anterior tibial artery. No protamine was given adequate hemostasis achieved wounds all closed in layers with Vicryl subjective fashion with Dermabond patient taken to recovery in satisfactory condition   [name], MD 01/06/2016 11:59 AM

## 2016-01-06 NOTE — H&P (View-Only) (Signed)
Established Intermittent Claudication  History of Present Illness  [name] is a 65 y.o. ([birth year]) male who presents with chief complaint: left leg pain.  The patient's symptoms have not progressed.  The patient's symptoms are: severe short distance intermittent claudication.  The patient's treatment regimen currently included: maximal medical management.  He was originally scheduled for a Left femoropopliteal bypass with Dr. Kellie Simmering in September but the patient developed GI bleeding, so this was canceled.  This patient denies any rest pain, anesthesia, or motor loss at this point.    Angiogram done by myself on 07/30/15: demonstrated:      Occluded bilateral superficial femoral artery stents  Essentially single vessel runoff in right leg: peroneal artery  Left below-the-knee popliteal artery appears adequate for bypass target     The patient's PMH, PSH, and SH, and FamHx are unchanged from 07/30/15.  No current facility-administered medications for this encounter.   Current Outpatient Prescriptions  Medication Sig Dispense Refill  . acetaminophen (TYLENOL) 500 MG tablet Take 1,000 mg by mouth every 6 (six) hours as needed for moderate pain.    Marland Kitchen aspirin EC 81 MG tablet Take 162 mg by mouth daily.    . benazepril (LOTENSIN) 5 MG tablet Take 1 tablet (5 mg total) by mouth daily. 30 tablet 5  . cilostazol (PLETAL) 100 MG tablet Take 100 mg by mouth 2 (two) times daily.  11  . clopidogrel (PLAVIX) 75 MG tablet TAKE 1 TABLET EVERY DAY 90 tablet 3  . ezetimibe-simvastatin (VYTORIN) 10-40 MG per tablet Take 1 tablet by mouth every morning. 30 tablet 11  . gabapentin (NEURONTIN) 800 MG tablet Take 1.5 tablets (1,200 mg total) by mouth 3 (three) times daily. 135 tablet 3  . metFORMIN (GLUCOPHAGE) 500 MG tablet Take 500 mg by mouth daily.    . pantoprazole (PROTONIX) 40 MG tablet TAKE 1 TABLET BY MOUTH EVERY DAY (Patient taking differently: Take 40 mg by mouth every day) 30 tablet 11    . cephALEXin (KEFLEX) 500 MG capsule Take 1 capsule (500 mg total) by mouth 4 (four) times daily. (Patient not taking: Reported on 12/25/2015) 40 capsule 0  . docusate sodium (COLACE) 100 MG capsule Take 1 capsule (100 mg total) by mouth 2 (two) times daily. (Patient not taking: Reported on 12/25/2015) 60 capsule 1  . ferrous sulfate (FERROUSUL) 325 (65 FE) MG tablet Take 1 tablet (325 mg total) by mouth daily with breakfast. (Patient not taking: Reported on 12/25/2015) 30 tablet 1  . HYDROcodone-acetaminophen (NORCO/VICODIN) 5-325 MG tablet Take 1 tablet by mouth 2 (two) times daily as needed for moderate pain. (Patient not taking: Reported on 12/25/2015) 60 tablet 0  . Nebivolol HCl (BYSTOLIC) 20 MG TABS Take 1 tablet (20 mg total) by mouth daily. (Patient not taking: Reported on 12/25/2015) 30 tablet 11    Allergies  Allergen Reactions  . Glipizide Other (See Comments)    Severe hypoglycemia to 40s.     On ROS today: no rest pain, no gangrene or ulcer.   Physical Examination  Filed Vitals:   12/25/15 2215 12/25/15 2230 12/25/15 2245 12/25/15 2300  BP: 111/89 133/75 141/80 144/69  Pulse: 71 68 67 66  Temp:      TempSrc:      Resp:      Height:      Weight:      SpO2: 100% 99% 100% 100%   Body mass index is 31 kg/(m^2).  General: A&O x 3, WD, Obese,  Pulmonary: Sym exp, good air movt, CTAB, no rales, rhonchi, & wheezing  Cardiac: RRR, Nl S1, S2, no Murmurs, rubs or gallops  Vascular: Vessel Right Left  Radial Faintly palpable Not Palpable  Brachial Palpable Palpable  Carotid Palpable, without bruit Palpable, without bruit  Aorta Not palpable N/A  Femoral Palpable Palpable  Popliteal Not palpable Not palpable  PT Not Palpable Not Palpable  DP Not Palpable Not Palpable   Gastrointestinal: soft, NTND, no G/R, no HSM, no masses, no CVAT B  Musculoskeletal: M/S 5/5 throughout including bilateral DF/PF, Extremities without ischemic changes , rubor in left foot, no  ulcers or frank gangrenous changes  Neurologic: Pain and light touch intact in extremities including feet, Motor exam as listed above   Medical Decision Making  [name] is a 65 y.o. male who presents with:  bilateral (L>R) leg intermittent claudication without evidence of critical limb ischemia or threatened limb.   Based on the patient's vascular studies and examination, I have offered the patient: Left femoropopliteal bypass (CFA to BK pop BPG) in next 1-2 weeks.  No emergency surgery is necessary as this patient does not have a threatened limb.  Will schedule with next available surgeon in the next 1-2 weeks if Dr. Kellie Simmering not available.  I discussed in depth with the patient the nature of atherosclerosis, and emphasized the importance of maximal medical management including strict control of blood pressure, blood glucose, and lipid levels, antiplatelet agents, obtaining regular exercise, and cessation of smoking.    The patient is aware that without maximal medical management the underlying atherosclerotic disease process will progress, limiting the benefit of any interventions. The patient is currently on a statin: Vytorin  The patient is currently on an anti-platelet: ASA.  Thank you for allowing [place] to participate in this patient's care.   Adele Barthel, MD [facility] [place] Office: [phone] Pager: [phone]  12/25/2015, 11:30 PM

## 2016-01-06 NOTE — Progress Notes (Addendum)
  Day of Surgery Note    Subjective:  No complaints  Filed Vitals:   01/06/16 1230 01/06/16 1242  BP:    Pulse: 74 70  Temp:    Resp: 18 20    Incisions:   Left groin incision is soft; other incisions are covered and dry Extremities:  Brisk left PT/DP doppler signal; biphasic peroneal doppler signal left Cardiac:  regular Lungs:  Non labored   Assessment/Plan:  This is a 65 y.o. male who is s/p left CFA to below knee popliteal artery bypass using non-reversed translocated saphenous vein graft from left leg with intraoperative arteriogram  -pt doing well in pacu with brisk left PT/DP doppler signal -continue pain control -no Metformin x 2 days due to contrast given intraoperatively. -pt on Pletal-will discontinue -to [place] when bed available    Leontine Locket, PA-C 01/06/2016 1:05 PM

## 2016-01-06 NOTE — Anesthesia Postprocedure Evaluation (Signed)
Anesthesia Post Note  Patient: [name]  Procedure(s) Performed: Procedure(s) (LRB): Left  COMMON FEMORAL-BELOW KNEE POPLITEAL ARTERY Bypass using non-reversed translocated saphenous vein graft from left leg (Left) INTRA OPERATIVE ARTERIOGRAM LEFT LOWER LEG (Left) LEFT GREATER SAPPHENOUS VEIN HARVEST (Left)  Patient location during evaluation: PACU Anesthesia Type: General Level of consciousness: awake and alert Pain management: pain level controlled Vital Signs Assessment: post-procedure vital signs reviewed and stable Respiratory status: spontaneous breathing, nonlabored ventilation, respiratory function stable and patient connected to nasal cannula oxygen Cardiovascular status: blood pressure returned to baseline and stable Postop Assessment: no signs of nausea or vomiting Anesthetic complications: no    Last Vitals:  Filed Vitals:   01/06/16 1315 01/06/16 1330  BP: 140/68 120/67  Pulse: 67 70  Temp:    Resp: 19 14    Last Pain:  Filed Vitals:   01/06/16 1340  PainSc: 10-Worst pain ever                 Effie Berkshire

## 2016-01-06 NOTE — Anesthesia Procedure Notes (Signed)
Procedure Name: Intubation Date/Time: 01/06/2016 8:35 AM Performed by: Julian Reil Pre-anesthesia Checklist: Patient identified, Emergency Drugs available, Suction available and Patient being monitored Patient Re-evaluated:Patient Re-evaluated prior to inductionOxygen Delivery Method: Circle system utilized Preoxygenation: Pre-oxygenation with 100% oxygen Intubation Type: IV induction Ventilation: Mask ventilation without difficulty and Oral airway inserted - appropriate to patient size Laryngoscope Size: Mac and 4 Grade View: Grade II Tube type: Oral Tube size: 7.5 mm Number of attempts: 1 Airway Equipment and Method: Stylet Placement Confirmation: ETT inserted through vocal cords under direct vision,  positive ETCO2 and breath sounds checked- equal and bilateral Secured at: 22 cm Tube secured with: Tape Dental Injury: Teeth and Oropharynx as per pre-operative assessment

## 2016-01-07 ENCOUNTER — Encounter (HOSPITAL_COMMUNITY): Payer: Self-pay | Admitting: Vascular Surgery

## 2016-01-07 ENCOUNTER — Encounter (HOSPITAL_COMMUNITY): Payer: Medicare Other

## 2016-01-07 LAB — CBC
HCT: 25.9 % — ABNORMAL LOW (ref 39.0–52.0)
Hemoglobin: 8 g/dL — ABNORMAL LOW (ref 13.0–17.0)
MCH: 23.6 pg — ABNORMAL LOW (ref 26.0–34.0)
MCHC: 30.9 g/dL (ref 30.0–36.0)
MCV: 76.4 fL — ABNORMAL LOW (ref 78.0–100.0)
Platelets: 239 10*3/uL (ref 150–400)
RBC: 3.39 MIL/uL — ABNORMAL LOW (ref 4.22–5.81)
RDW: 15.9 % — ABNORMAL HIGH (ref 11.5–15.5)
WBC: 7.7 10*3/uL (ref 4.0–10.5)

## 2016-01-07 LAB — BASIC METABOLIC PANEL
Anion gap: 6 (ref 5–15)
BUN: 6 mg/dL (ref 6–20)
CO2: 26 mmol/L (ref 22–32)
Calcium: 8.1 mg/dL — ABNORMAL LOW (ref 8.9–10.3)
Chloride: 106 mmol/L (ref 101–111)
Creatinine, Ser: 0.78 mg/dL (ref 0.61–1.24)
GFR calc Af Amer: >60 mL/min (ref >60)
GFR calc non Af Amer: >60 mL/min (ref >60)
Glucose, Bld: 108 mg/dL — ABNORMAL HIGH (ref 65–99)
Potassium: 3.5 mmol/L (ref 3.5–5.1)
Sodium: 138 mmol/L (ref 135–145)

## 2016-01-07 LAB — GLUCOSE, CAPILLARY
Glucose-Capillary: 101 mg/dL — ABNORMAL HIGH (ref 65–99)
Glucose-Capillary: 121 mg/dL — ABNORMAL HIGH (ref 65–99)
Glucose-Capillary: 120 mg/dL — ABNORMAL HIGH (ref 65–99)
Glucose-Capillary: 120 mg/dL — ABNORMAL HIGH (ref 65–99)

## 2016-01-07 NOTE — Evaluation (Signed)
Occupational Therapy Evaluation Patient Details Name: [name] MRN: [id number] DOB: [birth year] Today's Date: 01/07/2016    History of Present Illness Pt is a 65 y/o M s/p Lt common femoral below knee popliteal artery bypass.  Pt's PMH includes chronic low back pain, cervical radiculopathy, Rt toe fx, anemia.   Clinical Impression   Pt was using a walking stick prior to admission, otherwise he was independent. Pt with increased pain at incision site with heavy reliance on UEs in standing. Pain prevents pt from being able to perform LB bathing and dressing.  He has difficulty rising from low surfaces. Pt is not interested in adaptive equipment for LB bathing and dressing. He will rely on his wife to assist.  Will follow.  Follow Up Recommendations  No OT follow up    Equipment Recommendations  3 in 1 bedside comode    Recommendations for Other Services       Precautions / Restrictions Precautions Precautions: Fall Restrictions Weight Bearing Restrictions: No      Mobility Bed Mobility Overal bed mobility: Needs Assistance Bed Mobility: Supine to Sit;Sit to Supine     Supine to sit: Min guard Sit to supine: Min assist   General bed mobility comments: HOB up, used rail, cues to use log roll technique to protect back, assist for L LE back into bed  Transfers Overall transfer level: Needs assistance Equipment used: Rolling walker (2 wheeled) Transfers: Sit to/from Stand Sit to Stand: Min assist         General transfer comment: verbal cues for hand placement and sequencing use of RW, min assist to rise, steady and control descent     Balance Overall balance assessment: Needs assistance Sitting-balance support: No upper extremity supported;Feet supported Sitting balance-Leahy Scale: Good     Standing balance support: Bilateral upper extremity supported;During functional activity Standing balance-Leahy Scale: Poor Standing balance comment: Relient on RW                             ADL Overall ADL's : Needs assistance/impaired Eating/Feeding: Independent;Sitting   Grooming: Wash/dry hands;Set up;Sitting   Upper Body Bathing: Set up;Sitting   Lower Body Bathing: Sit to/from stand;Maximal assistance   Upper Body Dressing : Set up;Sitting   Lower Body Dressing: Sit to/from stand;Maximal assistance   Toilet Transfer: Min guard;RW;Comfort height toilet   Toileting- Clothing Manipulation and Hygiene: Minimal assistance;Sit to/from stand       Functional mobility during ADLs: Min guard;Rolling walker General ADL Comments: Pt is not interested in AE for LB ADL, will rely on his wife until pain subsides and he can again access his feet. Educated pt in use of 3 in 1 and he agreed one would be required at home.     Vision     Perception     Praxis      Pertinent Vitals/Pain Pain Assessment: Faces Pain Score: 10-Worst pain ever Faces Pain Scale: Hurts whole lot Pain Location: L LE Pain Descriptors / Indicators: Aching;Burning;Operative site guarding;Grimacing;Guarding Pain Intervention(s): Limited activity within patient's tolerance;Monitored during session;Repositioned;Patient requesting pain meds-RN notified     Hand Dominance Right   Extremity/Trunk Assessment Upper Extremity Assessment Upper Extremity Assessment: Overall WFL for tasks assessed          Communication Communication Communication: No difficulties   Cognition Arousal/Alertness: Awake/alert Behavior During Therapy: WFL for tasks assessed/performed Overall Cognitive Status: Within Functional Limits for tasks assessed  General Comments       Exercises      Shoulder Instructions      Home Living Family/patient expects to be discharged to:: Private residence Living Arrangements: Spouse/significant other Available Help at Discharge: Family;Available PRN/intermittently;Friend(s) (spouse works at night) Type of  Home: House Home Access: Long: One level     Bathroom Shower/Tub: Teacher, early years/pre: [place]: Shower seat (walking stick)          Prior Functioning/Environment Level of Independence: Independent with assistive device(s)        Comments: PTA used walking stick at all times    OT Diagnosis: Generalized weakness;Acute pain   OT Problem List: Decreased strength;Decreased activity tolerance;Impaired balance (sitting and/or standing);Decreased knowledge of use of DME or AE;Obesity;Pain   OT Treatment/Interventions: Self-care/ADL training;DME and/or AE instruction;Therapeutic activities;Patient/family education    OT Goals(Current goals can be found in the care plan section) Acute Rehab OT Goals Patient Stated Goal: decreased pain OT Goal Formulation: With patient Time For Goal Achievement: 01/14/16 Potential to Achieve Goals: Good ADL Goals Pt Will Perform Grooming: with supervision;standing Pt Will Perform Lower Body Dressing: with min assist;sit to/from stand Pt Will Transfer to Toilet: with supervision;ambulating;bedside commode (over toilet) Pt Will Perform Toileting - Clothing Manipulation and hygiene: with supervision;sit to/from stand Pt Will Perform Tub/Shower Transfer: Tub transfer;with supervision;ambulating;shower seat;rolling walker  OT Frequency: Min 2X/week   Barriers to D/C:            Co-evaluation              End of Session Nurse Communication: Patient requests pain meds  Activity Tolerance: Patient limited by pain Patient left: in bed;with call bell/phone within reach   Time: [id number] OT Time Calculation (min): 17 min Charges:  OT General Charges $OT Visit: 1 Procedure OT Evaluation $OT Eval Moderate Complexity: 1 Procedure G-Codes:    Malka So 01/07/2016, 3:09 PM  [phone]

## 2016-01-07 NOTE — Progress Notes (Addendum)
  Progress Note    01/07/2016 7:28 AM 1 Day Post-Op  Subjective:  C/o pain in his groin and his foot is tender  Afebrile HR 70's-90's NSR 0000000 systolic 0000000 RA  Filed Vitals:   01/06/16 2350 01/07/16 0418  BP: 111/68 108/63  Pulse: 90 88  Temp: 98.4 F (36.9 C) 98.2 F (36.8 C)  Resp: 20 19    Physical Exam: Cardiac:  regular Lungs:  Non labored Incisions:  All are clean and dry Extremities:  Left foot is warm with palpable left DP and brisk doppler signals left DP/PT/peroneal   CBC    Component Value Date/Time   WBC 7.7 01/07/2016 0415   RBC 3.39* 01/07/2016 0415   RBC 4.03* 09/10/2015 0740   HGB 8.0* 01/07/2016 0415   HCT 25.9* 01/07/2016 0415   PLT 239 01/07/2016 0415   MCV 76.4* 01/07/2016 0415   MCH 23.6* 01/07/2016 0415   MCHC 30.9 01/07/2016 0415   RDW 15.9* 01/07/2016 0415   LYMPHSABS 2.2 12/25/2015 1833   MONOABS 0.5 12/25/2015 1833   EOSABS 0.2 12/25/2015 1833   BASOSABS 0.0 12/25/2015 1833    BMET    Component Value Date/Time   NA 138 01/07/2016 0415   K 3.5 01/07/2016 0415   CL 106 01/07/2016 0415   CO2 26 01/07/2016 0415   GLUCOSE 108* 01/07/2016 0415   BUN 6 01/07/2016 0415   CREATININE 0.78 01/07/2016 0415   CREATININE 0.94 03/31/2015 1454   CALCIUM 8.1* 01/07/2016 0415   GFRNONAA >60 01/07/2016 0415   GFRAA >60 01/07/2016 0415    INR    Component Value Date/Time   INR 1.11 12/31/2015 1525     Intake/Output Summary (Last 24 hours) at 01/07/16 0728 Last data filed at 01/07/16 0400  Gross per 24 hour  Intake 3186.67 ml  Output   1165 ml  Net 2021.67 ml     Assessment:  65 y.o. male is s/p:  left CFA to below knee popliteal artery bypass using non-reversed translocated saphenous vein graft from left leg with intraoperative arteriogram  1 Day Post-Op  Plan: -doing well this morning with patent bypass graft with brisk doppler signals in the left DP/PT/peroneal -c/o pain, but controlled with pain meds -acute  surgical blood loss anemia with hgb of 8.0.  Will check again tomorrow-if drops below 8, may need transfusion.  Tolerating now. -pt needs to get out of bed to chair.  PT consult in  -discussed groin wound care and its importance when he goes home.  He expressed understanding -DVT prophylaxis:  SQ heparin to start this afternoon -elevate legs when not ambulating to help with swelling   Leontine Locket, PA-C [facility] [phone] 01/07/2016 7:28 AM  Addendum  I agree with the physician assistant's findings.  Ok to go to floor.  Adele Barthel, MD [facility] Office: [phone] Pager: [phone]  01/07/2016, 10:15 AM

## 2016-01-07 NOTE — Progress Notes (Signed)
01/07/2016 1700 Received pt to room 2w18 from 3S.  Pt is s/p Left Fem/Pop.  Tele monitor applied and CCMD notified.  Pt is with pain, will medicate.  Oriented to room, call light and bed.  Call bell in reach. [place]

## 2016-01-07 NOTE — Care Management Note (Signed)
Case Management Note  Patient Details  Name: ESSIEN BAHL MRN: [id number] Date of Birth: Jul 05, 1951  Subjective/Objective:              Admitted with PVD,  S/P  Left COMMON FEMORAL-BELOW KNEE POPLITEAL ARTERY Bypass. From home with wife. Independent with ADL's PTA. No prior DME usage.   Action/Plan:  Return to home when medically stable. Awaiting PT evaluation.... CM to f/u with disposition needs.  Expected Discharge Date:                  Expected Discharge Plan:  Home/Self Care  In-House Referral:     Discharge planning Services  CM Consult  Post Acute Care Choice:    Choice offered to:     DME Arranged:    DME Agency:     HH Arranged:    HH Agency:     Status of Service:  In process, will continue to follow  Medicare Important Message Given:    Date Medicare IM Given:    Medicare IM give by:    Date Additional Medicare IM Given:    Additional Medicare Important Message give by:     If discussed at [place] of Stay Meetings, dates discussed:    Additional Comments: Carlus Pavlov (wife) [phone]   Sharin Mons, [place] [phone] 01/07/2016, 10:30 AM

## 2016-01-07 NOTE — Evaluation (Signed)
Physical Therapy Evaluation Patient Details Name: [name] MRN: [id number] DOB: 1951-05-29 Today's Date: 01/07/2016   History of Present Illness  Pt is a 65 y/o M s/p Lt common femoral below knee popliteal artery bypass.  Pt's PMH includes chronic low back pain, cervical radiculopathy, Rt toe fx, anemia.  Clinical Impression  Patient is s/p above surgery resulting in functional limitations due to the deficits listed below (see PT Problem List). [name] will have assist available from his wife during the day (she works night shift).  He currently requires min assist for sit<>stand and min guard assist for ambulation.  Limited by pain in Lt LE, educated pt on importance of mobilizing to dec stiffness and pain. Patient will benefit from skilled PT to increase their independence and safety with mobility to allow discharge to the venue listed below.      Follow Up Recommendations No PT follow up;Supervision for mobility/OOB    Equipment Recommendations  Rolling walker with 5" wheels;3in1 (PT)    Recommendations for Other Services OT consult     Precautions / Restrictions Precautions Precautions: Fall Restrictions Weight Bearing Restrictions: No      Mobility  Bed Mobility Overal bed mobility: Needs Assistance Bed Mobility: Supine to Sit     Supine to sit: Min guard     General bed mobility comments: Cues for technique.  Increased time, HOB flat, no use of bed rails.  Transfers Overall transfer level: Needs assistance Equipment used: Rolling walker (2 wheeled) Transfers: Sit to/from Stand Sit to Stand: Min assist         General transfer comment: Cues for correct technique using RW and for hand placement w/ sit<>stand.  Pt slow to stand due to pain.  Assist to steady when standing and for controlled descent to chair.  Ambulation/Gait Ambulation/Gait assistance: Min guard Ambulation Distance (Feet): 70 Feet Assistive device: Rolling walker (2 wheeled) Gait  Pattern/deviations: Step-to pattern;Decreased stance time - left;Decreased weight shift to left;Decreased step length - right;Shuffle;Antalgic;Trunk flexed   Gait velocity interpretation: <1.8 ft/sec, indicative of risk for recurrent falls General Gait Details: Cues for WB through RW to offload Lt LE.  Encouragement to push pt to ambulate into hallway.  Several standing rest breaks due to pain and fatigue of Bil UEs.    Stairs            Wheelchair Mobility    Modified Rankin (Stroke Patients Only)       Balance Overall balance assessment: Needs assistance Sitting-balance support: No upper extremity supported;Feet supported Sitting balance-Leahy Scale: Good     Standing balance support: Bilateral upper extremity supported;During functional activity Standing balance-Leahy Scale: Poor Standing balance comment: Relient on RW                             Pertinent Vitals/Pain Pain Assessment: 0-10 Pain Score: 10-Worst pain ever Pain Location: Lt LE Pain Descriptors / Indicators: Aching;Tightness;Constant;Grimacing;Moaning;Guarding Pain Intervention(s): Limited activity within patient's tolerance;Monitored during session;Repositioned;Premedicated before session;Utilized relaxation techniques    Home Living Family/patient expects to be discharged to:: Private residence Living Arrangements: Spouse/significant other Available Help at Discharge: Family;Available PRN/intermittently;Friend(s) (wife works night shift) Type of Home: House Home Access: Ramped entrance     [place]: One [place]: Other (comment) (walking stick)      Prior Function Level of Independence: Independent with assistive device(s)         Comments: PTA used walking stick at all  times     Hand Dominance   Dominant Hand: Right    Extremity/Trunk Assessment   Upper Extremity Assessment: Defer to OT evaluation           Lower Extremity Assessment: LLE  deficits/detail   LLE Deficits / Details: limited ROM and strength as expected s/p Lt fem pop bypass     Communication   Communication: No difficulties  Cognition Arousal/Alertness: Awake/alert Behavior During Therapy: WFL for tasks assessed/performed Overall Cognitive Status: Within Functional Limits for tasks assessed                      General Comments      Exercises General Exercises - Lower Extremity Ankle Circles/Pumps: AROM;Both;10 reps;Supine;Seated Long Arc Quad: AROM;Both;10 reps;Seated      Assessment/Plan    PT Assessment Patient needs continued PT services  PT Diagnosis Difficulty walking;Acute pain   PT Problem List Decreased strength;Decreased range of motion;Decreased activity tolerance;Decreased balance;Decreased mobility;Decreased knowledge of use of DME;Decreased safety awareness;Impaired sensation;Pain  PT Treatment Interventions DME instruction;Gait training;Stair training;Functional mobility training;Therapeutic activities;Therapeutic exercise;Balance training;Neuromuscular re-education;Patient/family education;Modalities   PT Goals (Current goals can be found in the Care Plan section) Acute Rehab PT Goals Patient Stated Goal: decreased pain PT Goal Formulation: With patient Time For Goal Achievement: 01/16/16 Potential to Achieve Goals: Good    Frequency Min 3X/week   Barriers to discharge Decreased caregiver support wife works night shift    Co-evaluation               End of Session Equipment Utilized During Treatment: Gait belt Activity Tolerance: Patient limited by pain Patient left: in chair;with call bell/phone within reach Nurse Communication: Mobility status         Time: [id number] PT Time Calculation (min) (ACUTE ONLY): 43 min   Charges:   PT Evaluation $PT Eval Moderate Complexity: 1 Procedure PT Treatments $Gait Training: 8-22 mins $Therapeutic Exercise: 8-22 mins   PT G Codes:       Joslyn Hy PT, DPT  [phone] Pager: [phone] 01/07/2016, 12:23 PM

## 2016-01-07 NOTE — Progress Notes (Signed)
UR COMPLETED  

## 2016-01-08 ENCOUNTER — Telehealth: Payer: Self-pay | Admitting: Vascular Surgery

## 2016-01-08 ENCOUNTER — Encounter: Payer: Self-pay | Admitting: Vascular Surgery

## 2016-01-08 ENCOUNTER — Inpatient Hospital Stay (HOSPITAL_COMMUNITY): Payer: Medicare Other

## 2016-01-08 DIAGNOSIS — I739 Peripheral vascular disease, unspecified: Secondary | ICD-10-CM

## 2016-01-08 LAB — GLUCOSE, CAPILLARY
Glucose-Capillary: 118 mg/dL — ABNORMAL HIGH (ref 65–99)
Glucose-Capillary: 102 mg/dL — ABNORMAL HIGH (ref 65–99)
Glucose-Capillary: 143 mg/dL — ABNORMAL HIGH (ref 65–99)
Glucose-Capillary: 146 mg/dL — ABNORMAL HIGH (ref 65–99)

## 2016-01-08 LAB — CBC
HCT: 26.6 % — ABNORMAL LOW (ref 39.0–52.0)
Hemoglobin: 8.3 g/dL — ABNORMAL LOW (ref 13.0–17.0)
MCH: 24.3 pg — ABNORMAL LOW (ref 26.0–34.0)
MCHC: 31.2 g/dL (ref 30.0–36.0)
MCV: 77.8 fL — ABNORMAL LOW (ref 78.0–100.0)
Platelets: 228 10*3/uL (ref 150–400)
RBC: 3.42 MIL/uL — ABNORMAL LOW (ref 4.22–5.81)
RDW: 16.2 % — ABNORMAL HIGH (ref 11.5–15.5)
WBC: 7.6 10*3/uL (ref 4.0–10.5)

## 2016-01-08 NOTE — Progress Notes (Signed)
Occupational Therapy Treatment Patient Details Name: [name] MRN: [id number] DOB: Sep 03, 1951 Today's Date: 01/08/2016    History of present illness Pt is a 65 y/o M s/p Lt common femoral below knee popliteal artery bypass.  Pt's PMH includes chronic low back pain, cervical radiculopathy, Rt toe fx, anemia.   OT comments  Pt. Making gains with skilled OT.  Able to complete stand pivot transfers and functional mobility in room.  Requires cues for hand placement and sequencing during sit/stand.  Will continue to follow acutely.    Follow Up Recommendations  No OT follow up    Equipment Recommendations  3 in 1 bedside comode    Recommendations for Other Services      Precautions / Restrictions Precautions Precautions: Fall       Mobility Bed Mobility               General bed mobility comments: pt. in recliner upon arrival  Transfers Overall transfer level: Needs assistance Equipment used: Rolling walker (2 wheeled) Transfers: Sit to/from [facility] Sit to Stand: Min assist Stand pivot transfers: Min guard       General transfer comment: verbal cues for hand placement and sequencing use of RW, min assist to rise, steady and control descent     Balance                                   ADL Overall ADL's : Needs assistance/impaired     Grooming: Wash/dry hands;Wash/dry face;Standing;Min guard Grooming Details (indicate cue type and reason): simulated duirng in room ambulation and transfers                 Toilet Transfer: Minimal assistance;Ambulation;RW Toilet Transfer Details (indicate cue type and reason): amb. across room to sink and sat down in chair, declined the need to use the b.room Toileting- Clothing Manipulation and Hygiene: Minimal assistance;Sit to/from stand       Functional mobility during ADLs: Electronics engineer     Praxis       Cognition   Behavior During Therapy: WFL for tasks assessed/performed Overall Cognitive Status: Within Functional Limits for tasks assessed                       Extremity/Trunk Assessment               Exercises     Shoulder Instructions       General Comments      Pertinent Vitals/ Pain       Pain Assessment:  (did not rate but c/o L groin pain and toe pain)  Home Living                                          Prior Functioning/Environment              Frequency Min 2X/week     Progress Toward Goals  OT Goals(current goals can now be found in the care plan section)  Progress towards OT goals: Progressing toward goals     Plan Discharge plan remains appropriate    Co-evaluation  End of Session Equipment Utilized During Treatment: Gait belt;Rolling walker   Activity Tolerance Patient tolerated treatment well   Patient Left in chair;with call bell/phone within reach   Nurse Communication Other (comment) (spoke with CNA for transfer techniques/status )        Time: [id number] OT Time Calculation (min): 21 min  Charges: OT General Charges $OT Visit: 1 Procedure OT Treatments $Self Care/Home Management : 8-22 mins  Janice Coffin, COTA/L 01/08/2016, 10:40 AM

## 2016-01-08 NOTE — Care Management Note (Signed)
Case Management Note  Patient Details  Name: [name] MRN: [id number] Date of Birth: 1951-10-15  Subjective/Objective:              Admitted with PVD,  S/P  Left COMMON FEMORAL-BELOW KNEE POPLITEAL ARTERY Bypass. From home with wife. Independent with ADL's PTA. No prior DME usage.   Action/Plan:  Return to home when medically stable. Awaiting PT evaluation.... CM to f/u with disposition needs.  Expected Discharge Date:                  Expected Discharge Plan:  Home/Self Care  In-House Referral:     Discharge planning Services  CM Consult  Post Acute Care Choice:    Choice offered to:     DME Arranged:    DME Agency:     HH Arranged:    HH Agency:     Status of Service:  In process, will continue to follow  Medicare Important Message Given:    Date Medicare IM Given:    Medicare IM give by:    Date Additional Medicare IM Given:    Additional Medicare Important Message give by:     If discussed at [place] of Stay Meetings, dates discussed:    Additional Comments: 01/08/2016 CM left physician sticky note requesting HH/DME orders to be written based on documented recommendations  - HH nor DME have been arranged at this time.  Carlus Pavlov (wife) [phone]   Melida Gimenez [phone] 01/08/2016, 3:55 PM

## 2016-01-08 NOTE — Telephone Encounter (Signed)
-----   Message from Gabriel Earing, [place] sent at 01/06/2016 10:34 AM EST ----- S/p left fem pop with vein 01/06/16.  F/u with Dr. Kellie Simmering in 2 weeks.  Thanks, Aldona Bar

## 2016-01-08 NOTE — Telephone Encounter (Signed)
LM for pt re appt, dpm °

## 2016-01-08 NOTE — Progress Notes (Signed)
Physical Therapy Treatment Patient Details Name: RIYAZ CIAK MRN: [id number] DOB: Apr 27, 1951 Today's Date: 01/08/2016    History of Present Illness Pt is a 65 y/o M s/p Lt common femoral below knee popliteal artery bypass.  Pt's PMH includes chronic low back pain, cervical radiculopathy, Rt toe fx, anemia.    PT Comments    Progressing well.  Emphasis on transfer safety, sequencing and progressive ambulation.   Follow Up Recommendations  No PT follow up;Supervision for mobility/OOB     Equipment Recommendations  Rolling walker with 5" wheels;3in1 (PT)    Recommendations for Other Services       Precautions / Restrictions      Mobility  Bed Mobility Overal bed mobility: Needs Assistance Bed Mobility: Supine to Sit     Supine to sit: Min guard     General bed mobility comments: slow, use of rail, but no assist given  Transfers Overall transfer level: Needs assistance Equipment used: Rolling walker (2 wheeled) Transfers: Sit to/from Stand Sit to Stand: Min guard         General transfer comment: cues for hand placement  Ambulation/Gait Ambulation/Gait assistance: Min guard Ambulation Distance (Feet): 70 Feet Assistive device: Rolling walker (2 wheeled) Gait Pattern/deviations: Step-to pattern;Decreased step length - right;Decreased step length - left;Decreased stride length;Decreased stance time - left   Gait velocity interpretation: Below normal speed for age/gender General Gait Details: cues for sequencing   Stairs            Wheelchair Mobility    Modified Rankin (Stroke Patients Only)       Balance Overall balance assessment: Needs assistance Sitting-balance support: No upper extremity supported Sitting balance-Leahy Scale: Good       Standing balance-Leahy Scale: Poor Standing balance comment: reliant on the RW                    Cognition Arousal/Alertness: Awake/alert Behavior During Therapy: WFL for tasks  assessed/performed Overall Cognitive Status: Within Functional Limits for tasks assessed                      Exercises      General Comments        Pertinent Vitals/Pain Pain Assessment: Faces Faces Pain Scale: Hurts even more Pain Location: L LE, calf Pain Descriptors / Indicators: Aching;Discomfort;Grimacing Pain Intervention(s): Monitored during session    Home Living                      Prior Function            PT Goals (current goals can now be found in the care plan section) Acute Rehab PT Goals Patient Stated Goal: decreased pain PT Goal Formulation: With patient Time For Goal Achievement: 01/16/16 Potential to Achieve Goals: Good Progress towards PT goals: Progressing toward goals    Frequency  Min 3X/week    PT Plan Current plan remains appropriate    Co-evaluation             End of Session           Time: [id number] PT Time Calculation (min) (ACUTE ONLY): 19 min  Charges:  $Gait Training: 8-22 mins                    G Codes:      Kerri-Anne Haeberle, Tessie Fass 01/08/2016, 4:34 PM 01/08/2016  Donnella Sham, PT [phone]  (pager)

## 2016-01-08 NOTE — Progress Notes (Signed)
VASCULAR LAB PRELIMINARY  ARTERIAL  ABI completed:    RIGHT    LEFT    PRESSURE WAVEFORM  PRESSURE WAVEFORM  BRACHIAL 129 Triphasic BRACHIAL 139 Triphasic  DP 56 Severely dampened monophasic DP 95 Monophasic  AT   AT    PT 52 Severely dampened monophasic PT 105 Monophasic  PER   PER    GREAT TOE  NA GREAT TOE  NA    RIGHT LEFT  ABI 0.40 0.76   The right ABI is suggestive of severe arterial insufficiency at rest. The left ABI is suggestive of moderate arterial insufficiency at rest.   01/08/2016 11:42 AM Maudry Mayhew, RVT, RDCS, RDMS

## 2016-01-08 NOTE — Progress Notes (Addendum)
  [facility] Progress Note  Subjective  - POD #2  Having pain with incisions.   Objective Filed Vitals:   01/07/16 2029 01/08/16 0442  BP: 124/61 137/63  Pulse: 85 92  Temp: 98 F (36.7 C) 98.7 F (37.1 C)  Resp: 20 18    Intake/Output Summary (Last 24 hours) at 01/08/16 0824 Last data filed at 01/08/16 0735  Gross per 24 hour  Intake      0 ml  Output   1850 ml  Net  -1850 ml    Left leg incisions clean and intact. Left leg swelling.  Brisk left DP and PT doppler signals.   Assessment/Planning: 65 y.o. male is s/p: left CFA to below knee popliteal artery bypass using non-reversed translocated saphenous vein from left leg with intraoperative arteriogram.  2 Days Post-Op   Left leg bypass patent.  Elevate left leg to help with swelling.  ABLA: Hgb stable.  Patient wanting to stay to increase mobilization.  Continue PT/OT.  Dispo: discharge when pain controlled and increasing mobilization. Anticipate d/c next 1-2 days.   Alvia Grove 01/08/2016 8:24 AM --  Laboratory CBC    Component Value Date/Time   WBC 7.6 01/08/2016 0348   HGB 8.3* 01/08/2016 0348   HCT 26.6* 01/08/2016 0348   PLT 228 01/08/2016 0348    BMET    Component Value Date/Time   NA 138 01/07/2016 0415   K 3.5 01/07/2016 0415   CL 106 01/07/2016 0415   CO2 26 01/07/2016 0415   GLUCOSE 108* 01/07/2016 0415   BUN 6 01/07/2016 0415   CREATININE 0.78 01/07/2016 0415   CREATININE 0.94 03/31/2015 1454   CALCIUM 8.1* 01/07/2016 0415   GFRNONAA >60 01/07/2016 0415   GFRAA >60 01/07/2016 0415    COAG Lab Results  Component Value Date   INR 1.11 12/31/2015   INR 1.26 09/10/2015   INR 1.28 09/08/2015   No results found for: PTT  Antibiotics Anti-infectives    Start     Dose/Rate Route Frequency Ordered Stop   01/06/16 2030  cefUROXime (ZINACEF) 1.5 g in dextrose 5 % 50 mL IVPB     1.5 g 100 mL/hr over 30 Minutes Intravenous Every 12 hours 01/06/16 1610 01/07/16  1019   01/06/16 0800  cefUROXime (ZINACEF) 1.5 g in dextrose 5 % 50 mL IVPB     1.5 g 100 mL/hr over 30 Minutes Intravenous To [facility]Stay [facility] 01/05/16 1024 01/06/16 [place], PA-C [facility] Office: [phone] Pager: [phone] 01/08/2016 8:24 AM  Addendum  I have independently interviewed and examined the patient, and I agree with the physician assistant's findings.  Home once pain control better with ambulation.  Awaiting post-op ABI.  Adele Barthel, MD [facility] [place] Office: [phone] Pager: [phone]  01/08/2016, 9:27 AM

## 2016-01-09 LAB — GLUCOSE, CAPILLARY
Glucose-Capillary: 119 mg/dL — ABNORMAL HIGH (ref 65–99)
Glucose-Capillary: 114 mg/dL — ABNORMAL HIGH (ref 65–99)
Glucose-Capillary: 159 mg/dL — ABNORMAL HIGH (ref 65–99)
Glucose-Capillary: 155 mg/dL — ABNORMAL HIGH (ref 65–99)

## 2016-01-09 MED ORDER — NICOTINE 21 MG/24HR TD PT24
21.0000 mg | MEDICATED_PATCH | Freq: Every day | TRANSDERMAL | Status: DC
  Administered 2016-01-09 – 2016-01-11 (×3): 21 mg via TRANSDERMAL
  Filled 2016-01-09 (×3): qty 1

## 2016-01-09 NOTE — Discharge Summary (Signed)
[facility] Discharge Summary   Patient ID:  [name] MRN: [id number] DOB/AGE: Sep 10, 1951 65 y.o.  Admit date: 01/06/2016 Discharge date: 01/11/2016 Date of Surgery: 01/06/2016 Surgeon: Surgeon(s): Mal Misty, MD Rosetta Posner, MD  Admission Diagnosis: Peripheral vascular disease with bilateral lower extremity claudication I70.213  Discharge Diagnoses:  Peripheral vascular disease with bilateral lower extremity claudication I70.213  Secondary Diagnoses: Past Medical History  Diagnosis Date  . Hyperlipidemia     takes Vytorin daily  . Arthritis     OA  . Cervical radiculopathy     Dr. Vertell Limber neurosurgery  . PAD (peripheral artery disease) ([place])   . Tobacco user   . Chronic lower back pain   . Toe fracture, right 05/09/2011  . Shortness of breath dyspnea     with exertion  . Anemia   . History of blood transfusion     "related to low HgB" ((09/10/2015  . Pneumonia   . Hypertension     takes Benazepril and Bystolic daily  . GERD (gastroesophageal reflux disease)     takes Protonix daily  . Diabetes mellitus     takes Metformin daily    Procedure(s): Left  COMMON FEMORAL-BELOW KNEE POPLITEAL ARTERY Bypass using non-reversed translocated saphenous vein graft from left leg INTRA OPERATIVE ARTERIOGRAM LEFT LOWER LEG LEFT GREATER SAPPHENOUS VEIN HARVEST  Discharged Condition: good  HPI:  [name] is a 65 y.o. (08-25-1951) male who presents with chief complaint: left leg pain. The patient's symptoms have not progressed. The patient's symptoms are: severe short distance intermittent claudication. The patient's treatment regimen currently included: maximal medical management. He was originally scheduled for a Left femoropopliteal bypass with Dr. Kellie Simmering in September but the patient developed GI bleeding, so this was canceled. This patient denies any rest pain, anesthesia, or motor loss at this point.   Angiogram done by myself on 07/30/15:  demonstrated:      Occluded bilateral superficial femoral artery stents  Essentially single vessel runoff in right leg: peroneal artery  Left below-the-knee popliteal artery appears adequate for bypass target        Based on the patient's vascular studies and examination, I have offered the patient: Left femoropopliteal bypass (CFA to BK pop BPG) in next 1-2 weeks.  No emergency surgery is necessary as this patient does not have a threatened limb.   Hospital Course:  [name] is a 65 y.o. male is S/P  Procedure(s): Left  COMMON FEMORAL-BELOW KNEE POPLITEAL ARTERY Bypass using non-reversed translocated saphenous vein graft from left leg INTRA OPERATIVE ARTERIOGRAM LEFT LOWER LEG LEFT GREATER SAPPHENOUS VEIN HARVEST  POD#1  left CFA to below knee popliteal artery bypass using non-reversed translocated saphenous vein graft from left leg with intraoperative arteriogram  1 Day Post-Op  Plan: -doing well this morning with patent bypass graft with brisk doppler signals in the left DP/PT/peroneal -c/o pain, but controlled with pain meds -acute surgical blood loss anemia with hgb of 8.0. Will check again tomorrow-if drops below 8, may need transfusion. Tolerating now. -pt needs to get out of bed to chair. PT consult in  -discussed groin wound care and its importance when he goes home. He expressed understanding -DVT prophylaxis: SQ heparin to start this afternoon -elevate legs when not ambulating to help with swelling  POD#2 Left leg incisions clean and intact. Left leg swelling.  Brisk left DP and PT doppler signals.  Hgb stable  POD#3 left CFA to below knee popliteal artery bypass  using non-reversed translocated saphenous vein from left leg with intraoperative arteriogram.  Pain control, increase mobility pending discharge Rolling walker ordered for home.  POD#5 Pain well controlled on hydrocodone Plavix for history of stents, Asprin Prior to discharge  we will have him walk with a cane and decide if cane verses walker is what he will go home on. F/U in 2 weeks with Dr. Kellie Simmering  ABI completed:    RIGHT    LEFT    PRESSURE WAVEFORM  PRESSURE WAVEFORM  BRACHIAL 129 Triphasic BRACHIAL 139 Triphasic  DP 56 Severely dampened monophasic DP 95 Monophasic  AT   AT    PT 52 Severely dampened monophasic PT 105 Monophasic  PER   PER    GREAT TOE  NA GREAT TOE  NA    RIGHT LEFT  ABI 0.40 0.76             Significant Diagnostic Studies: CBC Lab Results  Component Value Date   WBC 7.6 01/08/2016   HGB 8.3* 01/08/2016   HCT 26.6* 01/08/2016   MCV 77.8* 01/08/2016   PLT 228 01/08/2016    BMET    Component Value Date/Time   NA 138 01/07/2016 0415   K 3.5 01/07/2016 0415   CL 106 01/07/2016 0415   CO2 26 01/07/2016 0415   GLUCOSE 108* 01/07/2016 0415   BUN 6 01/07/2016 0415   CREATININE 0.78 01/07/2016 0415   CREATININE 0.94 03/31/2015 1454   CALCIUM 8.1* 01/07/2016 0415   GFRNONAA >60 01/07/2016 0415   GFRAA >60 01/07/2016 0415   COAG Lab Results  Component Value Date   INR 1.11 12/31/2015   INR 1.26 09/10/2015   INR 1.28 09/08/2015     Disposition:  Discharge to :Home    Medication List    ASK your doctor about these medications        acetaminophen 500 MG tablet  Commonly known as:  TYLENOL  Take 1,000 mg by mouth every 6 (six) hours as needed for moderate pain.     aspirin EC 81 MG tablet  Take 162 mg by mouth daily.     benazepril 5 MG tablet  Commonly known as:  LOTENSIN  Take 1 tablet (5 mg total) by mouth daily.     cilostazol 100 MG tablet  Commonly known as:  PLETAL  Take 100 mg by mouth 2 (two) times daily.     clopidogrel 75 MG tablet  Commonly known as:  PLAVIX  TAKE 1 TABLET EVERY DAY     docusate sodium 100 MG capsule  Commonly known as:  COLACE  Take 1 capsule (100 mg  total) by mouth 2 (two) times daily.     ezetimibe-simvastatin 10-40 MG tablet  Commonly known as:  VYTORIN  Take 1 tablet by mouth every morning.     ferrous sulfate 325 (65 FE) MG tablet  Commonly known as:  FERROUSUL  Take 1 tablet (325 mg total) by mouth daily with breakfast.     gabapentin 800 MG tablet  Commonly known as:  NEURONTIN  Take 1.5 tablets (1,200 mg total) by mouth 3 (three) times daily.     HYDROcodone-acetaminophen 5-325 MG tablet  Commonly known as:  NORCO/VICODIN  Take 1-2 tablets by mouth every 6 (six) hours as needed for severe pain.     metFORMIN 500 MG tablet  Commonly known as:  GLUCOPHAGE  Take 500 mg by mouth daily.     Nebivolol HCl 20 MG Tabs  Commonly known as:  BYSTOLIC  Take 1 tablet (20 mg total) by mouth daily.     pantoprazole 40 MG tablet  Commonly known as:  PROTONIX  TAKE 1 TABLET BY MOUTH EVERY DAY       Verbal and written Discharge instructions given to the patient. Wound care per Discharge AVS     Follow-up Information    Follow up with [name], MD In 2 weeks.   Specialties:  Vascular Surgery, Interventional Cardiology, Cardiology   Why:  Office will call you to arrange your appt (sent)   Contact information:   [place] [address] [phone]       Signed: Roxy Horseman 01/09/2016, 9:36 AM  - For VQI Registry use --- Instructions: Press F2 to tab through selections.  Delete question if not applicable.   Post-op:  Wound infection: No  Graft infection: No  Transfusion: No  If yes, 0 units given New Arrhythmia: No Ipsilateral amputation: [x ] no, [ ]  Minor, [ ]  BKA, [ ]  AKA Discharge patency: [x ] Primary, [ ]  Primary assisted, [ ]  Secondary, [ ]  Occluded Patency judged by: [x ] Dopper only, [ ]  Palpable graft pulse, [ ]  Palpable distal pulse, [ ]  ABI inc. > 0.15, [ ]  Duplex Discharge ABI: R 0.40, L 0.76  D/C Ambulatory Status: Ambulatory  Complications: MI: [x ] No, [ ]  Troponin  only, [ ]  EKG or Clinical CHF: No Resp failure: [x ] none, [ ]  Pneumonia, [ ]  Ventilator Chg in renal function: [x ] none, [ ]  Inc. Cr > 0.5, [ ]  Temp. Dialysis, [ ]  Permanent dialysis Stroke: [x ] None, [ ]  Minor, [ ]  Major Return to OR: No  Reason for return to OR: [ ]  Bleeding, [ ]  Infection, [ ]  Thrombosis, [ ]  Revision  Discharge medications: Statin use:  YES ASA use:  Yes Plavix use:  YES Beta blocker use: Yes Coumadin use: No  for medical reason

## 2016-01-09 NOTE — Progress Notes (Addendum)
    Subjective  - Pain control is his biggest issue.  He is trying to move around a little more.   Objective 118/65 84 98.1 F (36.7 C) (Oral) 18 100%  Intake/Output Summary (Last 24 hours) at 01/09/16 0736 Last data filed at 01/09/16 0600  Gross per 24 hour  Intake    840 ml  Output   2200 ml  Net  -1360 ml   ABI completed:    RIGHT    LEFT    PRESSURE WAVEFORM  PRESSURE WAVEFORM  BRACHIAL 129 Triphasic BRACHIAL 139 Triphasic  DP 56 Severely dampened monophasic DP 95 Monophasic  AT   AT    PT 52 Severely dampened monophasic PT 105 Monophasic  PER   PER    GREAT TOE  NA GREAT TOE  NA    RIGHT LEFT  ABI 0.40 0.76        Doppler PT/DP Incisions  clean and dry Groin soft dry 4 x 4 placed in left groin Heart RRR Lungs non labored breathing  Assessment/Planning: POD # 3 left CFA to below knee popliteal artery bypass using non-reversed translocated saphenous vein from left leg with intraoperative arteriogram.  Pain control, increase mobility pending discharge Rolling walker and 3 in 1 ordered for home.   Laurence Slate [facility] 01/09/2016 7:36 AM --  Laboratory Lab Results:  Recent Labs  01/07/16 0415 01/08/16 0348  WBC 7.7 7.6  HGB 8.0* 8.3*  HCT 25.9* 26.6*  PLT 239 228   BMET  Recent Labs  01/06/16 1645 01/07/16 0415  NA  --  138  K  --  3.5  CL  --  106  CO2  --  26  GLUCOSE  --  108*  BUN  --  6  CREATININE 0.75 0.78  CALCIUM  --  8.1*    COAG Lab Results  Component Value Date   INR 1.11 12/31/2015   INR 1.26 09/10/2015   INR 1.28 09/08/2015   No results found for: PTT   Addendum  I have independently interviewed and examined the patient, and I agree with the physician assistant's findings.  Once ambulating better, ok to D/C  Adele Barthel, MD [facility] [place] Office: [phone] Pager: [phone]  01/09/2016, 9:43 AM

## 2016-01-10 LAB — BASIC METABOLIC PANEL
Anion gap: 6 (ref 5–15)
BUN: 7 mg/dL (ref 6–20)
CO2: 27 mmol/L (ref 22–32)
Calcium: 8.4 mg/dL — ABNORMAL LOW (ref 8.9–10.3)
Chloride: 106 mmol/L (ref 101–111)
Creatinine, Ser: 0.78 mg/dL (ref 0.61–1.24)
GFR calc Af Amer: >60 mL/min (ref >60)
GFR calc non Af Amer: >60 mL/min (ref >60)
Glucose, Bld: 155 mg/dL — ABNORMAL HIGH (ref 65–99)
Potassium: 3.7 mmol/L (ref 3.5–5.1)
Sodium: 139 mmol/L (ref 135–145)

## 2016-01-10 LAB — GLUCOSE, CAPILLARY
Glucose-Capillary: 137 mg/dL — ABNORMAL HIGH (ref 65–99)
Glucose-Capillary: 100 mg/dL — ABNORMAL HIGH (ref 65–99)
Glucose-Capillary: 130 mg/dL — ABNORMAL HIGH (ref 65–99)
Glucose-Capillary: 164 mg/dL — ABNORMAL HIGH (ref 65–99)

## 2016-01-10 LAB — MAGNESIUM: Magnesium: 1.9 mg/dL (ref 1.7–2.4)

## 2016-01-10 MED ORDER — HYDROCODONE-ACETAMINOPHEN 5-325 MG PO TABS
1.0000 | ORAL_TABLET | ORAL | Status: DC | PRN
  Administered 2016-01-10 – 2016-01-11 (×6): 2 via ORAL
  Filled 2016-01-10 (×2): qty 2
  Filled 2016-01-10: qty 1
  Filled 2016-01-10 (×3): qty 2
  Filled 2016-01-10: qty 1

## 2016-01-10 NOTE — Progress Notes (Addendum)
   Daily Progress Note  Assessment/Planning: POD #4 s/p L CFA to BK pop BPG w/ ips NR GSV   Still having some pain with ambulation   Decreased frequency on PO rx  Probably home one Monday   Subjective  - 4 Days Post-Op  C/o pain in L groin incision  Objective Filed Vitals:   01/09/16 0423 01/09/16 1349 01/09/16 2116 01/10/16 0625  BP: 118/65 106/61 117/67 97/56  Pulse: 84 93 95 90  Temp: 98.1 F (36.7 C) 97.3 F (36.3 C) 98.9 F (37.2 C) 98.9 F (37.2 C)  TempSrc: Oral Oral Oral Oral  Resp:  18 18 18   Height:      Weight:      SpO2: 100% 100% 100% 100%    Intake/Output Summary (Last 24 hours) at 01/10/16 0803 Last data filed at 01/10/16 0300  Gross per 24 hour  Intake    720 ml  Output      0 ml  Net    720 ml    PULM  CTAB CV  RRR GI  soft, NTND VASC  Inc c/d/i, L foot warm, ABI as below  Laboratory CBC    Component Value Date/Time   WBC 7.6 01/08/2016 0348   HGB 8.3* 01/08/2016 0348   HCT 26.6* 01/08/2016 0348   PLT 228 01/08/2016 0348    BMET    Component Value Date/Time   NA 139 01/10/2016 0325   K 3.7 01/10/2016 0325   CL 106 01/10/2016 0325   CO2 27 01/10/2016 0325   GLUCOSE 155* 01/10/2016 0325   BUN 7 01/10/2016 0325   CREATININE 0.78 01/10/2016 0325   CREATININE 0.94 03/31/2015 1454   CALCIUM 8.4* 01/10/2016 0325   GFRNONAA >60 01/10/2016 0325   GFRAA >60 01/10/2016 0325    ABI:    RIGHT    LEFT    PRESSURE WAVEFORM  PRESSURE WAVEFORM  BRACHIAL 129 Triphasic BRACHIAL 139 Triphasic  DP 56 Severely dampened monophasic DP 95 Monophasic  AT   AT    PT 52 Severely dampened monophasic PT 105 Monophasic  PER   PER    GREAT TOE  NA GREAT TOE  NA    RIGHT LEFT  ABI 0.40 0.76   The right ABI is suggestive of severe arterial insufficiency at rest. The left ABI is suggestive of moderate arterial insufficiency at rest.        Adele Barthel, MD [facility]  [facility] [place] Office: [phone] Pager: [phone]  01/10/2016, 8:03 AM

## 2016-01-11 LAB — GLUCOSE, CAPILLARY
Glucose-Capillary: 174 mg/dL — ABNORMAL HIGH (ref 65–99)
Glucose-Capillary: 91 mg/dL (ref 65–99)

## 2016-01-11 MED ORDER — HYDROCODONE-ACETAMINOPHEN 5-325 MG PO TABS: 1.0000 | ORAL_TABLET | ORAL | Status: DC | PRN

## 2016-01-11 NOTE — Care Management Note (Addendum)
Case Management Note  Patient Details  Name: [name] MRN: [id number] Date of Birth: 01-Jun-1951  Subjective/Objective:              Admitted with PVD,  S/P  Left COMMON FEMORAL-BELOW KNEE POPLITEAL ARTERY Bypass. From home with wife. Independent with ADL's PTA. No prior DME usage.   Action/Plan:  Return to home when medically stable. Awaiting PT evaluation.... CM to f/u with disposition needs.  Expected Discharge Date:                  Expected Discharge Plan:  Home/Self Care  In-House Referral:     Discharge planning Services  CM Consult  Post Acute Care Choice:    Choice offered to:  Patient  DME Arranged:  Walker rolling DME Agency:  [place]:    [place]:     Status of Service:  Completed, signed off  Medicare Important Message Given:    Date Medicare IM Given:    Medicare IM give by:    Date Additional Medicare IM Given:    Additional Medicare Important Message give by:     If discussed at [place] of Stay Meetings, dates discussed:    Additional Comments: 01/11/2016 Pt will discharge home today.  Pt refused 3:1, but accepts RW.  CM offered choice, pt chose [facility], agency contacted and referral accepted  01/08/16 CM left physician sticky note requesting HH/DME orders to be written based on documented recommendations  - HH nor DME have been arranged at this time.  Carlus Pavlov (wife) [phone]   Melida Gimenez [phone] 01/11/2016, 9:14 AM

## 2016-01-11 NOTE — Care Management Important Message (Signed)
Important Message  Patient Details  Name: [name] MRN: [id number] Date of Birth: Apr 12, 1951   Medicare Important Message Given:  Yes    Loann Quill 01/11/2016, 12:36 PM

## 2016-01-11 NOTE — Progress Notes (Signed)
Physical Therapy Treatment Patient Details Name: [name] MRN: [id number] DOB: 1951-01-21 Today's Date: 01/11/2016    History of Present Illness Pt is a 65 y/o M s/p Lt common femoral below knee popliteal artery bypass.  Pt's PMH includes chronic low back pain, cervical radiculopathy, Rt toe fx, anemia.    PT Comments    Pt will be going home with wife who will be getting him up often but will be helpful to avoid overdoing.  Pt is very upbeat and motivated, should progress completely through his recovery.  Follow Up Recommendations  No PT follow up;Supervision for mobility/OOB     Equipment Recommendations  Rolling walker with 5" wheels;3in1 (PT)    Recommendations for Other Services OT consult     Precautions / Restrictions Precautions Precautions: Fall Restrictions Weight Bearing Restrictions: No    Mobility  Bed Mobility Overal bed mobility:  (up when PT entered)             General bed mobility comments: pt in chair  Transfers Overall transfer level: Modified independent Equipment used: Rolling walker (2 wheeled) Transfers: Sit to/from [facility] Sit to Stand: Supervision         General transfer comment: reminded pt to walk up to chair and turn in front of it to get back to sitting, not to walk backwards for longer distances  Ambulation/Gait Ambulation/Gait assistance: Supervision Ambulation Distance (Feet): 150 Feet Assistive device: Rolling walker (2 wheeled) Gait Pattern/deviations: Step-through pattern;Wide base of support (heel to toe on LLE) Gait velocity: reduced Gait velocity interpretation: Below normal speed for age/gender     Stairs            Wheelchair Mobility    Modified Rankin (Stroke Patients Only)       Balance Overall balance assessment: Needs assistance Sitting-balance support: Feet supported Sitting balance-Leahy Scale: Good   Postural control: Posterior lean Standing balance support:  Bilateral upper extremity supported Standing balance-Leahy Scale: Fair                      Cognition Arousal/Alertness: Awake/alert Behavior During Therapy: WFL for tasks assessed/performed Overall Cognitive Status: Within Functional Limits for tasks assessed                      Exercises General Exercises - Lower Extremity Ankle Circles/Pumps: AROM;Both;5 reps Short Arc Quad: AROM;Both;10 reps Heel Slides: AROM;Left;5 reps;Other (comment) (stretches to increase ROM comfort)    General Comments General comments (skin integrity, edema, etc.): Pt is getting up with supervsion for safety, very motvated and wanting to get home.  Has ramped entrance so should be safe to get into house      Pertinent Vitals/Pain Pain Assessment: 0-10 Pain Score: 6  Faces Pain Scale: Hurts even more Pain Location: LLE thigh Pain Descriptors / Indicators: Aching Pain Intervention(s): Monitored during session;Premedicated before session    Home Living                      Prior Function            PT Goals (current goals can now be found in the care plan section) Acute Rehab PT Goals Patient Stated Goal: home today Progress towards PT goals: Progressing toward goals    Frequency  Min 3X/week    PT Plan Current plan remains appropriate    Co-evaluation             End of  Session Equipment Utilized During Treatment: Gait belt Activity Tolerance: Patient limited by pain Patient left: in chair;with call bell/phone within reach     Time: 0915-0939 PT Time Calculation (min) (ACUTE ONLY): 24 min  Charges:  $Gait Training: 8-22 mins $Therapeutic Exercise: 8-22 mins                    G Codes:      [name] Feb 02, 2016, 11:27 AM   [name], PT MS Acute Rehab Dept. Number: [facility] [id number] and [place] [phone]

## 2016-01-11 NOTE — Progress Notes (Signed)
Occupational Therapy Treatment Patient Details Name: [name] MRN: [id number] DOB: [birth year] Today's Date: 01/11/2016    History of present illness Pt is a 65 y/o M s/p Lt common femoral below knee popliteal artery bypass.  Pt's PMH includes chronic low back pain, cervical radiculopathy, Rt toe fx, anemia.   OT comments  Pt requiring supervision for standing ADL and toilet transfers. Continues to decline adaptive equipment. Pt thinks he can manage without a 3 in 1. Educated pt in tub transfer and to have wife supervise and place RW.  Pt is fairly reliant on UE support for ambulation, although he wants to transition to a cane. Communicated to PT that pt wants to try a cane.  Follow Up Recommendations  No OT follow up    Equipment Recommendations   (pt is declining a 3 in 1)    Recommendations for Other Services      Precautions / Restrictions Precautions Precautions: Fall       Mobility Bed Mobility               General bed mobility comments: pt in chair  Transfers Overall transfer level: Needs assistance Equipment used: Rolling walker (2 wheeled) Transfers: Sit to/from Stand Sit to Stand: Supervision              Balance                                   ADL Overall ADL's : Needs assistance/impaired     Grooming: Wash/dry hands;Sitting;Supervision/safety       Lower Body Bathing: Sit to/from stand;Maximal assistance Lower Body Bathing Details (indicate cue type and reason): recommended long handled bath sponge     Lower Body Dressing: Moderate assistance;Sit to/from stand Lower Body Dressing Details (indicate cue type and reason): pt using larger sock on L foot, assist needed, will rely on wife Toilet Transfer: Supervision/safety;RW;Grab Haematologist;Ambulation Toilet Transfer Details (indicate cue type and reason): pt able to rise with use of walker and grab bar, pt feels he can manage without a 3 in 1 and use the sink at  home to pull up  [name] and Hygiene: Supervision/safety;Sit to/from Nurse, children's Details (indicate cue type and reason): verbally educated and demonstrated tub transfer and walker placement, pt able to verbalize sequence and necessity of wife to supervise for safety, pt reports having a shower seat Functional mobility during ADLs: Supervision/safety;Rolling walker        Vision                     Perception     Praxis      Cognition   Behavior During Therapy: WFL for tasks assessed/performed Overall Cognitive Status: Within Functional Limits for tasks assessed                       Extremity/Trunk Assessment               Exercises     Shoulder Instructions       General Comments      Pertinent Vitals/ Pain       Pain Assessment: 0-10 Pain Score: 6  Pain Location: L LE Pain Descriptors / Indicators: Aching Pain Intervention(s): Limited activity within patient's tolerance;Monitored during session;Premedicated before session;Repositioned  Home Living  Prior Functioning/Environment              Frequency Min 2X/week     Progress Toward Goals  OT Goals(current goals can now be found in the care plan section)  Progress towards OT goals: Progressing toward goals  Acute Rehab OT Goals Patient Stated Goal: home today  Plan Discharge plan remains appropriate    Co-evaluation                 End of Session Equipment Utilized During Treatment: Gait belt;Rolling walker   Activity Tolerance Patient tolerated treatment well   Patient Left in chair;with call bell/phone within reach   Nurse Communication          Time: [id number] OT Time Calculation (min): 26 min  Charges: OT General Charges $OT Visit: 1 Procedure OT Treatments $Self Care/Home Management : 23-37 mins  [name] 01/11/2016, 9:01 AM   [phone]

## 2016-01-11 NOTE — Progress Notes (Signed)
Patient alert and oriented and in no distress. Patient given discharge instructions regarding s/s of infection to report, medication, activity, diet, smoking cessation, and  upcoming appointments.  Patient verbalized understanding of all instructions. Patient confirmed that he had all of his personal belongings.  Telemetry and peripheral IV discontinued.  Patient transported out via wheelchair by hospital volunteer.

## 2016-01-11 NOTE — Progress Notes (Addendum)
[facility] [place]  Subjective  - He states he walked more yesterday and his pain is better controlled since we increased the frequency.  He states he is ready to go home.   Objective 142/88 87 98.6 F (37 C) (Oral) 18 100%  Intake/Output Summary (Last 24 hours) at 01/11/16 0721 Last data filed at 01/10/16 1750  Gross per 24 hour  Intake    360 ml  Output    400 ml  Net    -40 ml    Doppler PT/peronel signals on the left Incisions healing well Min edema left LE, compartments soft Herat RRR Lungs nonlabored breathing  Assessment/Planning: POD # 5 L CFA to BK pop BPG w/ ips NR GSV  Pain well controlled on hydrocodone Plavix for history of stents, Asprin Prior to discharge we will have him walk with a cane and decide if cane verses walker is what he will go home on. F/U in 2 weeks with Dr. Jolyn Nap, EMMA [facility] 01/11/2016 7:21 AM --  Laboratory Lab Results: No results for input(s): WBC, HGB, HCT, PLT in the last 72 hours. BMET  Recent Labs  01/10/16 0325  NA 139  K 3.7  CL 106  CO2 27  GLUCOSE 155*  BUN 7  CREATININE 0.78  CALCIUM 8.4*    COAG Lab Results  Component Value Date   INR 1.11 12/31/2015   INR 1.26 09/10/2015   INR 1.28 09/08/2015   No results found for: PTT Agree DC home today  RTO 2 weeks

## 2016-01-12 ENCOUNTER — Ambulatory Visit: Payer: Medicare Other | Admitting: Vascular Surgery

## 2016-01-15 ENCOUNTER — Encounter: Payer: Self-pay | Admitting: Vascular Surgery

## 2016-01-18 ENCOUNTER — Other Ambulatory Visit: Payer: Self-pay | Admitting: *Deleted

## 2016-01-18 ENCOUNTER — Encounter: Payer: Self-pay | Admitting: Vascular Surgery

## 2016-01-18 DIAGNOSIS — G8918 Other acute postprocedural pain: Secondary | ICD-10-CM

## 2016-01-18 MED ORDER — HYDROCODONE-ACETAMINOPHEN 5-325 MG PO TABS: 1.0000 | ORAL_TABLET | Freq: Four times a day (QID) | ORAL | Status: DC | PRN

## 2016-01-18 NOTE — Progress Notes (Signed)
Patient called in to request more pain medication. He has been ambulating more often. His incision is closed and dry per patient. He is afebrile. No bowel or bladder issues. He just reports pain in his left groin. There is no drainage, erythema or edema noted by patient or wife.  He states that "he is elevating his leg as often as possible but may be doing a little too much".  Dr. Kellie Simmering authorized a refill of Vicodin. Pt has post op visit schedule for 01-26-16.

## 2016-01-26 ENCOUNTER — Encounter: Payer: Self-pay | Admitting: Vascular Surgery

## 2016-01-26 ENCOUNTER — Ambulatory Visit (INDEPENDENT_AMBULATORY_CARE_PROVIDER_SITE_OTHER): Payer: Medicare Other | Admitting: Vascular Surgery

## 2016-01-26 VITALS — BP 120/77 | HR 95 | Temp 97.1°F | Resp 16 | Ht 67.0 in | Wt 200.0 lb

## 2016-01-26 DIAGNOSIS — G8918 Other acute postprocedural pain: Secondary | ICD-10-CM

## 2016-01-26 DIAGNOSIS — Z95828 Presence of other vascular implants and grafts: Secondary | ICD-10-CM

## 2016-01-26 MED ORDER — HYDROCODONE-ACETAMINOPHEN 5-325 MG PO TABS: 1.0000 | ORAL_TABLET | Freq: Four times a day (QID) | ORAL | Status: DC | PRN

## 2016-01-26 NOTE — Progress Notes (Signed)
POST OPERATIVE OFFICE NOTE    CC:  F/u for surgery  HPI:  This is a 65 y.o. male who is s/p left CFA to below knee popliteal artery bypass graft using non reversed saphenous vein (left leg) on 01/06/16 by Dr. Kellie Simmering.  The pt states that he does continue to have some pain in his left leg.  He states it is swollen, but he thinks it is a little better.  He does complain of some pain at his groin incision where there is a knot.  He states that he is getting around his home well.    Allergies  Allergen Reactions  . Glipizide Other (See Comments)    Severe hypoglycemia to 40s.     Current Outpatient Prescriptions  Medication Sig Dispense Refill  . acetaminophen (TYLENOL) 500 MG tablet Take 1,000 mg by mouth every 6 (six) hours as needed for moderate pain.    Marland Kitchen aspirin EC 81 MG tablet Take 162 mg by mouth daily.    . benazepril (LOTENSIN) 5 MG tablet Take 1 tablet (5 mg total) by mouth daily. 30 tablet 5  . clopidogrel (PLAVIX) 75 MG tablet TAKE 1 TABLET EVERY DAY 90 tablet 3  . docusate sodium (COLACE) 100 MG capsule Take 1 capsule (100 mg total) by mouth 2 (two) times daily. 60 capsule 1  . ezetimibe-simvastatin (VYTORIN) 10-40 MG per tablet Take 1 tablet by mouth every morning. 30 tablet 11  . ferrous sulfate (FERROUSUL) 325 (65 FE) MG tablet Take 1 tablet (325 mg total) by mouth daily with breakfast. 30 tablet 1  . gabapentin (NEURONTIN) 800 MG tablet Take 1.5 tablets (1,200 mg total) by mouth 3 (three) times daily. 135 tablet 3  . metFORMIN (GLUCOPHAGE) 500 MG tablet Take 500 mg by mouth daily.    . Nebivolol HCl (BYSTOLIC) 20 MG TABS Take 1 tablet (20 mg total) by mouth daily. 30 tablet 11  . pantoprazole (PROTONIX) 40 MG tablet TAKE 1 TABLET BY MOUTH EVERY DAY 30 tablet 11  . HYDROcodone-acetaminophen (NORCO/VICODIN) 5-325 MG tablet Take 1 tablet by mouth every 6 (six) hours as needed for severe pain. 40 tablet 0   No current facility-administered medications for this visit.     ROS:   See HPI  Physical Exam:  Filed Vitals:   01/26/16 1333  BP: 120/77  Pulse: 95  Temp: 97.1 F (36.2 C)  Resp: 16    Incision:  Left groin incision is healing nicely; there is some scar tissue just distal to the incision; vein harvest sites are healing; there is slight separation of the BK incision, but is healing nicely.  Extremities:  Biphasic left PT/peroneal and monophasic doppler signal left DP; +swelling left leg   Assessment/Plan:  This is a 65 y.o. male who is s/p: Left CFA to below knee popliteal bypass graft with left saphenous vein on 01/06/16  -pt is doing well with biphasic left PT/peroneal doppler signals -he does have swelling of the left leg and has been advised to elevate his leg as much as possible when not up.  I advised him to elevate his foot above his heart with his back flat.  He is also advised to elevate the foot of his bed to let gravity help the swelling at night. -he will return in 2 months with ABI's and arterial duplex of left leg bypass -he is given vicodin 5/325 one po q6h prn pain #40 (NF)   Leontine Locket, PA-C [facility] [phone]  Clinic  MD:  Pt seen and examined with Dr. Kellie Simmering  agree with above assessment Bypasses functioning nicely Needs to work on better elevation of leg to help treat edema  Return in 2 months

## 2016-01-26 NOTE — Addendum Note (Signed)
Addended by: Reola Calkins on: 01/26/2016 03:38 PM   Modules accepted: Orders

## 2016-01-28 ENCOUNTER — Other Ambulatory Visit: Payer: Self-pay | Admitting: Family Medicine

## 2016-01-28 MED ORDER — EZETIMIBE 10 MG PO TABS: 10.0000 mg | ORAL_TABLET | Freq: Every day | ORAL | Status: DC

## 2016-01-28 MED ORDER — SIMVASTATIN 40 MG PO TABS: 40.0000 mg | ORAL_TABLET | Freq: Every day | ORAL | Status: DC

## 2016-02-02 ENCOUNTER — Encounter: Payer: Self-pay | Admitting: Family Medicine

## 2016-02-02 ENCOUNTER — Ambulatory Visit (INDEPENDENT_AMBULATORY_CARE_PROVIDER_SITE_OTHER): Payer: Medicare Other | Admitting: Family Medicine

## 2016-02-02 VITALS — BP 121/59 | HR 94 | Temp 98.0°F | Wt 196.6 lb

## 2016-02-02 DIAGNOSIS — I10 Essential (primary) hypertension: Secondary | ICD-10-CM | POA: Diagnosis present

## 2016-02-02 DIAGNOSIS — H539 Unspecified visual disturbance: Secondary | ICD-10-CM | POA: Diagnosis not present

## 2016-02-02 MED ORDER — BENAZEPRIL HCL 5 MG PO TABS: 5.0000 mg | ORAL_TABLET | Freq: Every day | ORAL | Status: DC

## 2016-02-02 NOTE — Progress Notes (Signed)
   HPI  CC: Medication refill and blood pressure check Patient is here for medication refill and to follow-up on his blood pressure. He states that he is been doing well and is still recovering from his recent vascular surgery. Since that surgery he has been able to ambulate with significantly reduced pain in the affected leg. He still occasionally has swelling in the left (operative) leg, but this easily resolves with elevation.  He endorses good compliance with his medications at this time. He endorses some slowly developing difficulties with watching TV. He states that his vision seems to be getting worse over the past 1-2 years. When inquired patient states that he has been getting his reading glasses from the [employer] store and has not seen an optometrist.   No other issues reported.   ROS: Patient denies any headache, dizziness, lightheadedness, double vision, fatigue, chest pain, shortness of breath, dysphagia, abdominal pain, nausea, vomiting, diarrhea, constipation, new numbness, new weakness, or new paresthesias.    Past medical history and social history reviewed and updated in the EMR as appropriate.  Objective: BP 121/59 mmHg  Pulse 94  Temp(Src) 98 F (36.7 C) (Oral)  Wt 196 lb 9.6 oz (89.177 kg) Gen: NAD, alert, cooperative, and pleasant. HEENT: NCAT, EOMI, PERRL, no scleral injection or jaundice, no LAD, no JVD. CV: RRR, no murmur Resp: CTAB, no wheezes, non-labored EXT: Left leg slightly edematous (+1), dorsalis pedis pulses present bilaterally. Healing surgical incision noted on medial aspect of left leg. No evidence of infection. Neuro: Alert and oriented, Speech clear, No gross deficits  Assessment and plan:  HYPERTENSION, BENIGN SYSTEMIC Stable: Patient endorses good medication compliance. Blood pressure 121/59 today. - No changes to medication regimen at this time. - Refilled Lotensin.  Vision changes Patient endorses gradual worsening of far sighted vision.  Denies any floaters or darkening of vision. Patient has never seen an optometrist in the past. - I've asked patient to go to a local optometrist to be evaluated. I informed him that [facility] has relatively low priced checks.   Meds ordered this encounter  Medications  . benazepril (LOTENSIN) 5 MG tablet    Sig: Take 1 tablet (5 mg total) by mouth daily.    Dispense:  90 tablet    Refill:  3     Elberta Leatherwood, MD,MS,  PGY2 02/02/2016 8:45 PM

## 2016-02-02 NOTE — Assessment & Plan Note (Signed)
Patient endorses gradual worsening of far sighted vision. Denies any floaters or darkening of vision. Patient has never seen an optometrist in the past. - I've asked patient to go to a local optometrist to be evaluated. I informed him that [facility] has relatively low priced checks.

## 2016-02-02 NOTE — Patient Instructions (Signed)
It was a pleasure seeing you today in our clinic. Today we discussed your recent surgery and medications. Here is the treatment plan we have discussed and agreed upon together:   - Your surgical incision looks great today. Continue to elevate this leg to help with the swelling. - Continue to walk every day for short distances, be mindful of each step. - Continue taking your medications as prescribed. Today her blood pressure and your weight were both significantly improved from our previous visit. - I would like to have you be seen by a optometrist for your glasses prescription. [facility] is a relatively inexpensive place to go, but any optometrist should be able to help.

## 2016-02-02 NOTE — Assessment & Plan Note (Signed)
Stable: Patient endorses good medication compliance. Blood pressure 121/59 today. - No changes to medication regimen at this time. - Refilled Lotensin.

## 2016-02-08 ENCOUNTER — Telehealth: Payer: Self-pay | Admitting: *Deleted

## 2016-02-08 NOTE — Telephone Encounter (Signed)
Patient called in to triage requesting more pain medication. He is post Left fem-pop BPG by Dr. Kellie Simmering on 01-06-16 and was most recently seen by our PA on 01-26-16. He was rx'd Vicodin 5-325mg  # 40  One tablet qid prn pain. Today, Mr. [name] describes his pain as 8/10 especially at night and says he is unable to sleep more than 3 hours. He states that he has been elevating his leg as much as possible. He is afebrile but states" that his leg has a fever from the groin down to his toes". He also says that his leg is hot and numb.This has been ongoing x 1 week with no injuries or falls noted. He states that he is still having" dingy yellow" drainage with no odor. I will have patient come in for a wound check with our nurse practioner asap. He understands that this far out from surgery, we would need to see him for evaluation prior to writing any prescriptions for pain medications.

## 2016-02-09 ENCOUNTER — Ambulatory Visit (INDEPENDENT_AMBULATORY_CARE_PROVIDER_SITE_OTHER): Payer: Medicare Other | Admitting: Family

## 2016-02-09 ENCOUNTER — Encounter: Payer: Self-pay | Admitting: Family

## 2016-02-09 VITALS — BP 131/73 | HR 93 | Temp 98.0°F | Ht 67.0 in | Wt 200.0 lb

## 2016-02-09 DIAGNOSIS — Z720 Tobacco use: Secondary | ICD-10-CM

## 2016-02-09 DIAGNOSIS — Z95828 Presence of other vascular implants and grafts: Secondary | ICD-10-CM

## 2016-02-09 DIAGNOSIS — I779 Disorder of arteries and arterioles, unspecified: Secondary | ICD-10-CM

## 2016-02-09 DIAGNOSIS — F17290 Nicotine dependence, other tobacco product, uncomplicated: Secondary | ICD-10-CM

## 2016-02-09 DIAGNOSIS — G8918 Other acute postprocedural pain: Secondary | ICD-10-CM

## 2016-02-09 MED ORDER — HYDROCODONE-ACETAMINOPHEN 5-325 MG PO TABS: 1.0000 | ORAL_TABLET | Freq: Four times a day (QID) | ORAL | Status: DC | PRN

## 2016-02-09 NOTE — Progress Notes (Signed)
Postoperative Visit   History of Present Illness  [name] is a 65 y.o. year old male patient of Dr. Kellie Simmering who is s/p: Left CFA to below knee popliteal bypass graft with left saphenous vein on 01/06/16. Dr. Kellie Simmering and S. Rhynes PA-C last evaluated pt on 01/26/16. At that time pt was doing well with biphasic left PT/peroneal doppler signals. He had swelling of the left leg and was advised to elevate his leg as much as possible when not up, his foot above his heart with his back flat. He was also advised to elevate the foot of his bed to let gravity help the swelling at night. He was to return in 2 months with ABI's and arterial duplex of left leg bypass; this is scheduled for 03/29/16. He was given vicodin 5/325 one po q6h prn pain #40 (NF).  Pt returns today requesting more pain medication.   [name] describes his pain as 8/10 especially at night and says he is unable to sleep more than 3 hours. He states that he has been elevating his leg as much as possible, he has swelling from his left thigh to toes. This has been ongoing x 1 week with no injuries or falls noted. He states that he is still having" dingy yellow" drainage with no odor. He ran out of his Vicodin 3 days ago, has been using Tylenol for pain which he states is not effective, states he had to take 2 Vicodin for adequate relief.  He denies fever or chills.  He is doing his dressing changes daily with large non adherent band aids.   He admits to smoking 2 cigars/day.  The patient notes resolution of left lower extremity symptoms.  The patient is able to complete their activities of daily living.   For VQI Use Only  PRE-ADM LIVING: Home  AMB STATUS: Ambulatory with walker  Physical Examination  Filed Vitals:   02/09/16 1223  BP: 131/73  Pulse: 93  Temp: 98 F (36.7 C)  TempSrc: Oral  Height: 5\' 7"  (1.702 m)  Weight: 200 lb (90.719 kg)  SpO2: 100%   Body mass index is 31.32 kg/(m^2).  Left leg incisions  have small separations in the groin, medial thigh and calf, likely due to the amount of swelling. Left PT and DP pulses have strong Doppler signals, pt is too tender and swollen in his left foot to palpate pulses. Minimal erythema at left calf incision. Scant serous drainage from incisions.  Medical Decision Making  [name] is a 66 y.o. year old male who presents s/p left CFA to below knee popliteal bypass graft with left saphenous vein on 01/06/16. Pt c/o severe pain with swelling in his left leg, with mild separation of incisions due to swelling. His left groin has a 1 cm x 2 cm superficial separation of incision.  Dr. Kellie Simmering spoke with pt and examined him. Pt is instructed in the proper elevation of his left leg. It does not appear that he has been elevating his left leg adequately as he has moderate swelling in his left left leg.  Left pedal pulses with strong Doppler signals.  Pt may shower, cleanse left groin and leg wounds daily with soap and warm water. Pack left groin open wound 1-2xdaily with ribbon gauze, remove old gauze; pt was instructed in this and given starter supplies. Return in 2 weeks for left leg evaluation and left groin wound check. Reordered Vicodin 5/325, 1-2 tabs, po every 6  hours prn severe pain, disp #40, 0 refills. The patient was counseled re smoking cessation.    Faye Sanfilippo, Sharmon Leyden, RN, MSN, FNP-C [facility] [place] Office: [phone]  02/09/2016, 12:28 PM  Clinic MD: Kellie Simmering

## 2016-02-12 ENCOUNTER — Encounter: Payer: Self-pay | Admitting: Family

## 2016-02-23 ENCOUNTER — Ambulatory Visit (INDEPENDENT_AMBULATORY_CARE_PROVIDER_SITE_OTHER): Payer: Medicare Other | Admitting: Family

## 2016-02-23 ENCOUNTER — Encounter: Payer: Self-pay | Admitting: Family

## 2016-02-23 VITALS — BP 121/73 | HR 95 | Temp 98.2°F | Resp 16 | Ht 67.0 in | Wt 199.0 lb

## 2016-02-23 DIAGNOSIS — F17290 Nicotine dependence, other tobacco product, uncomplicated: Secondary | ICD-10-CM

## 2016-02-23 DIAGNOSIS — Z95828 Presence of other vascular implants and grafts: Secondary | ICD-10-CM

## 2016-02-23 DIAGNOSIS — Z4889 Encounter for other specified surgical aftercare: Secondary | ICD-10-CM

## 2016-02-23 DIAGNOSIS — I779 Disorder of arteries and arterioles, unspecified: Secondary | ICD-10-CM

## 2016-02-23 DIAGNOSIS — Z720 Tobacco use: Secondary | ICD-10-CM

## 2016-02-23 DIAGNOSIS — G8918 Other acute postprocedural pain: Secondary | ICD-10-CM

## 2016-02-23 MED ORDER — TRAMADOL HCL 50 MG PO TABS: ORAL_TABLET | ORAL | Status: DC

## 2016-02-23 NOTE — Progress Notes (Signed)
Postoperative Visit   History of Present Illness  [name] is a 65 y.o. year old male patient of Dr. Kellie Simmering who is s/p: Left CFA to below knee popliteal bypass graft with left saphenous vein on 01/06/16. He returns today for 2 weeks follow up. He no longer has any drainage from his left groin incision, continues not to have any drainage from his left leg incision.  The swelling in his left leg has decreased significantly since he has been elevating his legs adequately. 2 weeks ago his left leg had moderate swelling and subsequent separation of several areas of his left leg medial incision and left groin incision due to swelling; it seemed that he was not elevating his legs adequately to promote venous return. He was advised to elevate his leg as much as possible when not up, his foot above his heart with his back flat. He was also advised to elevate the foot of his bed to let gravity help the swelling at night.  He denies fever or chills.    He admits to smoking 2 cigars/day.  The patient notes resolution of left lower extremity symptoms. The patient is able to complete their activities of daily living.     Physical Examination  Filed Vitals:   02/23/16 1213  BP: 121/73  Pulse: 95  Temp: 98.2 F (36.8 C)  TempSrc: Oral  Resp: 16  Height: 5\' 7"  (1.702 m)  Weight: 199 lb (90.266 kg)  SpO2: 99%   Body mass index is 31.16 kg/(m^2).  Left groin and left leg medial incision open areas are granulating, decreasing in size, left pedal pulses are present by Doppler. Left leg swelling is significantly less than 2 weeks ago. No signs of infection, no drainage.  Medical Decision Making  [name] is a 65 y.o. year old male who presents s/p left CFA to below knee popliteal bypass graft with left saphenous vein on 01/06/16.  I discussed pt HPI and physical exam results with Dr. Kellie Simmering.   The patient's bypass incisions are healing appropriately with resolution of  pre-operative symptoms.  He has been elevating his legs as advised as evidenced by decreased swelling in left leg. Swelling is still present, but significantly less than 2 weeks ago.   Left groin and left leg medial incision separations are smaller and granulating well. No signs of infection.   He is having less pain along with less swelling in the left leg, but is requesting medication for pain as the pain keeps him awake at night after about 3 hours of sleep.  He states that he took his last Vicodin yesterday.   Tramadol 50 mg, take 1-2 tablets po every 6 hours prn pain, disp #40, 0 refills.     Continue walking as much as possible, when not walking elevate legs as instructed to further decrease swelling of left leg.  Continue daily shower with cleansing of left groin wound and left leg healing incisions.   Continue daily dry dressings changes to groin and left leg open areas of incision until fully granulated.  I discussed in depth with the patient the nature of atherosclerosis, and emphasized the importance of maximal medical management including strict control of blood pressure, blood glucose, and lipid levels, obtaining regular exercise, and cessation of smoking.  The patient is aware that without maximal medical management the underlying atherosclerotic disease process will progress, limiting the benefit of any interventions. The patient's surveillance will include ABI and bypass duplex studies  which will be completed as already scheduled for 03/29/16, at which time the patient will be re-evaluated by Dr. Kellie Simmering.   I emphasized the importance of routine surveillance of the patient's bypass, as the vascular surgery literature emphasize the improved patency possible with assisted primary patency procedures versus secondary patency procedures. The patient agrees to participate in their maximal medical care and routine surveillance.  Thank you for allowing [place] to participate in this patient's  care.  Karl Knarr, Sharmon Leyden, RN, MSN, FNP-C [facility] [place] Office: [phone]  02/23/2016, 12:28 PM  Clinic MD: Kellie Simmering

## 2016-03-08 ENCOUNTER — Telehealth: Payer: Self-pay

## 2016-03-08 NOTE — Telephone Encounter (Signed)
Phone call from pt. With request for refill on pain medication.  Reported he has "shooting pains from the knee to the foot" at intervals.  Rated the pain at 7/10, when questioned.  Stated the pain is in the area of below knee incision, that hadn't completely healed.  Reported the area open, in the below knee incision, is a little bigger than a pea in size; reported that the drainage is only a "spot" on the gauze, overnight.  Reported he continues to have swelling in the left lower leg, that he thought would be gone by now.  Reported he is elevating the leg, at intervals, as was instructed.  Advised at 2 months post op, he would need an appt. To be further evaluated for the continued pain.  Advised that a Scheduler will contact him with an appt.  Pt. Verb. Understanding.

## 2016-03-09 NOTE — Telephone Encounter (Signed)
LVM for pt with appt information, asked that he call back to confirm, dpm

## 2016-03-10 ENCOUNTER — Encounter (HOSPITAL_COMMUNITY): Payer: Medicare Other

## 2016-03-10 ENCOUNTER — Other Ambulatory Visit: Payer: Self-pay | Admitting: *Deleted

## 2016-03-14 ENCOUNTER — Other Ambulatory Visit: Payer: Self-pay | Admitting: Family Medicine

## 2016-03-14 ENCOUNTER — Encounter (HOSPITAL_COMMUNITY): Payer: Medicare Other

## 2016-03-14 ENCOUNTER — Encounter: Payer: Self-pay | Admitting: Family

## 2016-03-14 MED ORDER — CILOSTAZOL 100 MG PO TABS: 100.0000 mg | ORAL_TABLET | Freq: Two times a day (BID) | ORAL | Status: DC

## 2016-03-14 NOTE — Telephone Encounter (Signed)
Pt informed. Jiles Goya, CMA  

## 2016-03-14 NOTE — Telephone Encounter (Signed)
Pt says the pharmacy contacted him Saturday stating they could not get in touch with the dr to refill his blood pressure medicine and another medication. Pt does not the name of the blood pressure medicaton nor the name of the other medication. Please advise

## 2016-03-14 NOTE — Telephone Encounter (Signed)
Called pharmacy. Only one med needing refill. I have placed order.

## 2016-03-15 ENCOUNTER — Ambulatory Visit (INDEPENDENT_AMBULATORY_CARE_PROVIDER_SITE_OTHER): Payer: Medicare Other | Admitting: Family

## 2016-03-15 ENCOUNTER — Ambulatory Visit (HOSPITAL_COMMUNITY)
Admission: RE | Admit: 2016-03-15 | Discharge: 2016-03-15 | Disposition: A | Discharge status: Home / Self Care | Payer: Medicare Other | Source: Ambulatory Visit | Attending: Vascular Surgery | Admitting: Vascular Surgery

## 2016-03-15 ENCOUNTER — Ambulatory Visit (INDEPENDENT_AMBULATORY_CARE_PROVIDER_SITE_OTHER)
Admission: RE | Admit: 2016-03-15 | Discharge: 2016-03-15 | Disposition: A | Discharge status: Home / Self Care | Payer: Medicare Other | Source: Ambulatory Visit | Attending: Vascular Surgery | Admitting: Vascular Surgery

## 2016-03-15 ENCOUNTER — Encounter: Payer: Self-pay | Admitting: Family

## 2016-03-15 VITALS — BP 130/68 | HR 70 | Temp 97.8°F | Resp 18 | Ht 67.0 in | Wt 194.0 lb

## 2016-03-15 DIAGNOSIS — I739 Peripheral vascular disease, unspecified: Secondary | ICD-10-CM | POA: Insufficient documentation

## 2016-03-15 DIAGNOSIS — Z95828 Presence of other vascular implants and grafts: Secondary | ICD-10-CM

## 2016-03-15 DIAGNOSIS — I779 Disorder of arteries and arterioles, unspecified: Secondary | ICD-10-CM

## 2016-03-15 DIAGNOSIS — Z720 Tobacco use: Secondary | ICD-10-CM | POA: Insufficient documentation

## 2016-03-15 DIAGNOSIS — G8918 Other acute postprocedural pain: Secondary | ICD-10-CM

## 2016-03-15 DIAGNOSIS — E119 Type 2 diabetes mellitus without complications: Secondary | ICD-10-CM | POA: Insufficient documentation

## 2016-03-15 DIAGNOSIS — K219 Gastro-esophageal reflux disease without esophagitis: Secondary | ICD-10-CM | POA: Insufficient documentation

## 2016-03-15 DIAGNOSIS — Z7984 Long term (current) use of oral hypoglycemic drugs: Secondary | ICD-10-CM | POA: Diagnosis not present

## 2016-03-15 DIAGNOSIS — I10 Essential (primary) hypertension: Secondary | ICD-10-CM | POA: Diagnosis not present

## 2016-03-15 DIAGNOSIS — R0989 Other specified symptoms and signs involving the circulatory and respiratory systems: Secondary | ICD-10-CM | POA: Diagnosis present

## 2016-03-15 DIAGNOSIS — E785 Hyperlipidemia, unspecified: Secondary | ICD-10-CM | POA: Insufficient documentation

## 2016-03-15 MED ORDER — TRAMADOL HCL 50 MG PO TABS: ORAL_TABLET | ORAL | Status: DC

## 2016-03-15 NOTE — Patient Instructions (Signed)
Peripheral Vascular Disease Peripheral vascular disease (PVD) is a disease of the blood vessels that are not part of your heart and brain. A simple term for PVD is poor circulation. In most cases, PVD narrows the blood vessels that carry blood from your heart to the rest of your body. This can result in a decreased supply of blood to your arms, legs, and internal organs, like your stomach or kidneys. However, it most often affects a person's lower legs and feet. There are two types of PVD.  Organic PVD. This is the more common type. It is caused by damage to the structure of blood vessels.  Functional PVD. This is caused by conditions that make blood vessels contract and tighten (spasm). Without treatment, PVD tends to get worse over time. PVD can also lead to acute ischemic limb. This is when an arm or limb suddenly has trouble getting enough blood. This is a medical emergency. CAUSES Each type of PVD has many different causes. The most common cause of PVD is buildup of a fatty material (plaque) inside of your arteries (atherosclerosis). Small amounts of plaque can break off from the walls of the blood vessels and become lodged in a smaller artery. This blocks blood flow and can cause acute ischemic limb. Other common causes of PVD include:  Blood clots that form inside of blood vessels.  Injuries to blood vessels.  Diseases that cause inflammation of blood vessels or cause blood vessel spasms.  Health behaviors and health history that increase your risk of developing PVD. RISK FACTORS  You may have a greater risk of PVD if you:  Have a family history of PVD.  Have certain medical conditions, including:  High cholesterol.  Diabetes.  High blood pressure (hypertension).  Coronary heart disease.  Past problems with blood clots.  Past injury, such as burns or a broken bone. These may have damaged blood vessels in your limbs.  Buerger disease. This is caused by inflamed blood  vessels in your hands and feet.  Some forms of arthritis.  Rare birth defects that affect the arteries in your legs.  Use tobacco.  Do not get enough exercise.  Are obese.  Are age 50 or older. SIGNS AND SYMPTOMS  PVD may cause many different symptoms. Your symptoms depend on what part of your body is not getting enough blood. Some common signs and symptoms include:  Cramps in your lower legs. This may be a symptom of poor leg circulation (claudication).  Pain and weakness in your legs while you are physically active that goes away when you rest (intermittent claudication).  Leg pain when at rest.  Leg numbness, tingling, or weakness.  Coldness in a leg or foot, especially when compared with the other leg.  Skin or hair changes. These can include:  Hair loss.  Shiny skin.  Pale or bluish skin.  Thick toenails.  Inability to get or maintain an erection (erectile dysfunction). People with PVD are more prone to developing ulcers and sores on their toes, feet, or legs. These may take longer than normal to heal. DIAGNOSIS Your health care provider may diagnose PVD from your signs and symptoms. The health care provider will also do a physical exam. You may have tests to find out what is causing your PVD and determine its severity. Tests may include:  Blood pressure recordings from your arms and legs and measurements of the strength of your pulses (pulse volume recordings).  Imaging studies using sound waves to take pictures of   the blood flow through your blood vessels (Doppler ultrasound).  Injecting a dye into your blood vessels before having imaging studies using:  X-rays (angiogram or arteriogram).  Computer-generated X-rays (CT angiogram).  A powerful electromagnetic field and a computer (magnetic resonance angiogram or MRA). TREATMENT Treatment for PVD depends on the cause of your condition and the severity of your symptoms. It also depends on your age. Underlying  causes need to be treated and controlled. These include long-lasting (chronic) conditions, such as diabetes, high cholesterol, and high blood pressure. You may need to first try making lifestyle changes and taking medicines. Surgery may be needed if these do not work. Lifestyle changes may include:  Quitting smoking.  Exercising regularly.  Following a low-fat, low-cholesterol diet. Medicines may include:  Blood thinners to prevent blood clots.  Medicines to improve blood flow.  Medicines to improve your blood cholesterol levels. Surgical procedures may include:  A procedure that uses an inflated balloon to open a blocked artery and improve blood flow (angioplasty).  A procedure to put in a tube (stent) to keep a blocked artery open (stent implant).  Surgery to reroute blood flow around a blocked artery (peripheral bypass surgery).  Surgery to remove dead tissue from an infected wound on the affected limb.  Amputation. This is surgical removal of the affected limb. This may be necessary in cases of acute ischemic limb that are not improved through medical or surgical treatments. HOME CARE INSTRUCTIONS  Take medicines only as directed by your health care provider.  Do not use any tobacco products, including cigarettes, chewing tobacco, or electronic cigarettes. If you need help quitting, ask your health care provider.  Lose weight if you are overweight, and maintain a healthy weight as directed by your health care provider.  Eat a diet that is low in fat and cholesterol. If you need help, ask your health care provider.  Exercise regularly. Ask your health care provider to suggest some good activities for you.  Use compression stockings or other mechanical devices as directed by your health care provider.  Take good care of your feet.  Wear comfortable shoes that fit well.  Check your feet often for any cuts or sores. SEEK MEDICAL CARE IF:  You have cramps in your legs  while walking.  You have leg pain when you are at rest.  You have coldness in a leg or foot.  Your skin changes.  You have erectile dysfunction.  You have cuts or sores on your feet that are not healing. SEEK IMMEDIATE MEDICAL CARE IF:  Your arm or leg turns cold and blue.  Your arms or legs become red, warm, swollen, painful, or numb.  You have chest pain or trouble breathing.  You suddenly have weakness in your face, arm, or leg.  You become very confused or lose the ability to speak.  You suddenly have a very bad headache or lose your vision.   This information is not intended to replace advice given to you by your health care provider. Make sure you discuss any questions you have with your health care provider.   Document Released: 01/19/2005 Document Revised: 01/02/2015 Document Reviewed: 05/22/2014 Elsevier Interactive Patient Education 2016 [employer]  

## 2016-03-15 NOTE — Progress Notes (Addendum)
Postoperative Visit   History of Present Illness  ARIANNA BETHLEY is a 65 y.o. year old male male patient of Dr. Kellie Simmering who is s/p: Left CFA to below knee popliteal bypass graft with left saphenous vein on 01/06/16.  He returns today with c/o LLE pain, also increased swelling (swelling all the time, numbness from left knee down) . Pain and swelling since left common femoral- BK popliteal Bypass with harvested vein on 01-07-2016. Pain in posterior lower left leg is 5-6/10; he ran out of tramadol which adequately helped his pain and has been using Tylenol for pain which he states is not adequate for pain control.   His left groin incision is well healed, his left lower leg incision is well healed with prior separated area on lower leg incision mostly granulated in.  The swelling in his left leg has decreased significantly since he has been elevating his legs adequately. At his 02/09/16 visit, his left leg had moderate swelling and subsequent separation of several areas of his left leg medial incision and left groin incision due to swelling; it seemed that he was not elevating his legs adequately to promote venous return. He was advised to elevate his leg as much as possible when not walking, his foot above his heart with his back flat. He was also advised to elevate the foot of his bed to let gravity help the swelling at night.  He denies fever or chills.    He admits to smoking 2 cigars/day.  The patient notes resolution of left lower extremity symptoms. The patient is able to complete their activities of daily living.    For VQI Use Only   PRE-ADM LIVING: Home  AMB STATUS: Ambulatory with cane  Physical Examination  Filed Vitals:   03/15/16 1517  BP: 130/68  Pulse: 70  Temp: 97.8 F (36.6 C)  TempSrc: Oral  Resp: 18  Height: 5\' 7"  (1.702 m)  Weight: 194 lb (87.998 kg)  SpO2: 100%   Body mass index is 30.38 kg/(m^2).     Left LE arterial duplex (03/15/2016): Patent  left LE bypass graft with elevated velocities at the distal anastomosis; however, due to edema and incision, 2D imaging is sub-optimal and disease is not well visualized.  ABI (Date: 03/15/2016)  R: 0.56 (0.40, 01/08/16), DP: monophasic, PT: monophasic, TBI: dampened  L: 0.75 (0.76), DP: monophasic, PT: biphasic, TBI: occlusive     Medical Decision Making  DAYVEON BULMAN is a 65 y.o. year old male who presents s/p: Left CFA to below knee popliteal bypass graft with left saphenous vein on 01/06/16.  I spoke with Dr. Kellie Simmering about pt continued pain and numbness from right posterior knee to heel, reviewed left LE duplex and ABI results.   Today's left LE arterial duplex suggests a patent left LE bypass graft with elevated velocities at the distal anastomosis; however, due to edema and incision, 2D imaging is sub-optimal and disease is not well visualized.  ABI on the right has improved slightly from 40% to 56%, left ABI remains stable at 75%.  He has less swelling in his left lower leg than his last 2 post operative visits.  His incisions are well healed, small separated area of incision at lower leg has almost completely granulated in.  He has resolution of his preoperative left leg ischemic pain.   He denies dyspnea.  I reviewed with him to walk as much as possible and when not walking to elevate feet above knees and  feet above heart. Tramadol reordered: 1-2 tablets po every 6 hours prn pain, disp #60, 0 refills.   The patient's bypass incisions are healing appropriately with resolution of pre-operative symptoms. I discussed in depth with the patient the nature of atherosclerosis, and emphasized the importance of maximal medical management including strict control of blood pressure, blood glucose, and lipid levels, obtaining regular exercise, and cessation of smoking.  The patient is aware that without maximal medical management the underlying atherosclerotic disease process will progress,  limiting the benefit of any interventions. The patient's surveillance will included ABI and bypass duplex studies which will be completed in: 3 months, at which time the patient will be re-evaluated.   I emphasized the importance of routine surveillance of the patient's bypass, as the vascular surgery literature emphasize the improved patency possible with assisted primary patency procedures versus secondary patency procedures. The patient agrees to participate in their maximal medical care and routine surveillance.  Thank you for allowing [place] to participate in this patient's care.  Karter Hellmer, Sharmon Leyden, RN, MSN, FNP-C [facility] Office: [phone]  03/15/2016, 3:53 PM  Clinic MD: Kellie Simmering

## 2016-03-17 NOTE — Addendum Note (Signed)
Addended by: Thresa Ross C on: 03/17/2016 12:00 PM   Modules accepted: Orders

## 2016-03-21 ENCOUNTER — Other Ambulatory Visit: Payer: Self-pay | Admitting: *Deleted

## 2016-03-21 MED ORDER — PANTOPRAZOLE SODIUM 40 MG PO TBEC: 40.0000 mg | DELAYED_RELEASE_TABLET | Freq: Every day | ORAL | Status: DC

## 2016-03-29 ENCOUNTER — Ambulatory Visit: Payer: Medicare Other | Admitting: Vascular Surgery

## 2016-03-29 ENCOUNTER — Encounter (HOSPITAL_COMMUNITY): Payer: Medicare Other

## 2016-04-07 ENCOUNTER — Telehealth: Payer: Self-pay | Admitting: *Deleted

## 2016-04-07 NOTE — Telephone Encounter (Signed)
Prior Authorization received from [facility] pharmacy for Vytorin 10-40 mg.  PA form placed in provider box for completion. Derl Barrow, RN

## 2016-04-11 NOTE — Telephone Encounter (Signed)
I'm confused: patient is not on "Vytorin". I have him on separate Simvastatin and Zetia. I believe this is the cheaper route. Is his insurance asking me to change this to the more expensive course?  I will leave paperwork in my box until I know purpose of the paperwork.

## 2016-05-10 ENCOUNTER — Encounter: Payer: Medicare Other | Admitting: Family

## 2016-05-10 ENCOUNTER — Encounter: Payer: Self-pay | Admitting: Family

## 2016-05-10 VITALS — BP 115/71 | HR 95 | Temp 97.6°F | Ht 67.0 in | Wt 198.0 lb

## 2016-05-10 DIAGNOSIS — Z95828 Presence of other vascular implants and grafts: Secondary | ICD-10-CM

## 2016-05-10 DIAGNOSIS — I779 Disorder of arteries and arterioles, unspecified: Secondary | ICD-10-CM

## 2016-05-10 DIAGNOSIS — F17290 Nicotine dependence, other tobacco product, uncomplicated: Secondary | ICD-10-CM

## 2016-05-10 DIAGNOSIS — Z4889 Encounter for other specified surgical aftercare: Secondary | ICD-10-CM

## 2016-05-10 NOTE — Progress Notes (Signed)
Left before being seen.

## 2016-06-22 ENCOUNTER — Encounter: Payer: Self-pay | Admitting: Family

## 2016-06-24 ENCOUNTER — Encounter: Payer: Self-pay | Admitting: Family Medicine

## 2016-06-24 ENCOUNTER — Ambulatory Visit (INDEPENDENT_AMBULATORY_CARE_PROVIDER_SITE_OTHER): Payer: Medicare Other | Admitting: Family Medicine

## 2016-06-24 VITALS — BP 98/53 | HR 97 | Temp 98.7°F | Ht 67.0 in | Wt 192.6 lb

## 2016-06-24 DIAGNOSIS — M545 Low back pain, unspecified: Secondary | ICD-10-CM

## 2016-06-24 DIAGNOSIS — E119 Type 2 diabetes mellitus without complications: Secondary | ICD-10-CM

## 2016-06-24 DIAGNOSIS — I779 Disorder of arteries and arterioles, unspecified: Secondary | ICD-10-CM | POA: Diagnosis not present

## 2016-06-24 DIAGNOSIS — M549 Dorsalgia, unspecified: Secondary | ICD-10-CM | POA: Insufficient documentation

## 2016-06-24 LAB — POCT GLYCOSYLATED HEMOGLOBIN (HGB A1C): Hemoglobin A1C: 6.7

## 2016-06-24 MED ORDER — CYCLOBENZAPRINE HCL 10 MG PO TABS: 10.0000 mg | ORAL_TABLET | Freq: Three times a day (TID) | ORAL | Status: DC | PRN

## 2016-06-24 MED ORDER — MELOXICAM 15 MG PO TABS: 15.0000 mg | ORAL_TABLET | Freq: Every day | ORAL | Status: DC

## 2016-06-24 NOTE — Assessment & Plan Note (Signed)
Patient is here with low right-sided lumbar back pain. History and symptoms with physical exam is consistent with lumbar spinal stenosis. Differential would include SI joint inflammation with lumbar paraspinal muscle spasms. No evidence of radicular pain. No red flag symptoms. - Flexeril as needed 2 weeks - Mobic once daily 2 weeks  Next: I've advised patient that if his symptoms do not improve over the next 1-2 weeks he is to contact our office for a follow-up visit. At that time it may be more beneficial to write an order for lumbar x-rays and a short course of prednisone prior to that follow-up visit.

## 2016-06-24 NOTE — Patient Instructions (Signed)
It was a pleasure seeing you today in our clinic. Today we discussed your back pain. Here is the treatment plan we have discussed and agreed upon together:   - I prescribed you Flexeril. Take 1 tablet 3 times a day as needed for back spasms and pain. - I prescribed you Mobic. Take 1 tablet every day for the next 2 weeks. Make sure to take this medication with food. - If you do not have any partial relief from your symptoms within the next 7 days please schedule follow-up appointment with me for reevaluation. At that time we will likely obtain x-rays of your low back and place you on a short course of oral steroids.

## 2016-06-24 NOTE — Progress Notes (Signed)
BACK PAIN Started ~1-1.5 months ago. Fell around that time. Was "nothing serious". [place] outside in the garden. Rt lower back. No radiation. No peripheral weakness  Location: right low back Quality: sharp Onset: 1-1.5 months ago Worse with: bending over Better with: nothing   Radiation: no Trauma: nothing direct Best sitting/standing/leaning forward: leaning forward  Red Flags Fecal/urinary incontinence: no  Numbness/Weakness: no  Fever/chills/sweats: no  Night pain: yes, having trouble sleeping  Unexplained weight loss: no  No relief with bedrest: no, slightly worse  h/o cancer/immunosuppression: no  IV drug use: no  PMH of osteoporosis or chronic steroid use: no   CC, SH/smoking status, and VS noted  Objective: BP 98/53 mmHg  Pulse 97  Temp(Src) 98.7 F (37.1 C) (Oral)  Ht 5\' 7"  (1.702 m)  Wt 192 lb 9.6 oz (87.363 kg)  BMI 30.16 kg/m2 Gen: NAD, alert, cooperative, and pleasant. HEENT: Neck FROM Back Exam:  Inspection: No bony deformity, rash, or scoliosis Motion: Limited in all 4 directions SLR: Negative Seated HS Flexibility: Tight Palpable tenderness: At the right SI joint and paraspinal muscles Sensory change: None Reflex change: None  Strength at foot Plantar-flexion:  5/ 5    Dorsi-flexion: 5 / 5    Eversion: 5 / 5   Inversion: 5 / 5 Leg strength Quad: 5 / 5   Hamstring: 5 / 5   Hip flexor:5  / 5   Hip abductors: 5 / 5 Gait slowed but normal without foot drop   Assessment and plan:  Lumbar back pain Patient is here with low right-sided lumbar back pain. History and symptoms with physical exam is consistent with lumbar spinal stenosis. Differential would include SI joint inflammation with lumbar paraspinal muscle spasms. No evidence of radicular pain. No red flag symptoms. - Flexeril as needed 2 weeks - Mobic once daily 2 weeks  Next: I've advised patient that if his symptoms do not improve over the next 1-2 weeks he is to contact our office for a  follow-up visit. At that time it may be more beneficial to write an order for lumbar x-rays and a short course of prednisone prior to that follow-up visit.    Orders Placed This Encounter  Procedures  . POCT A1C    Meds ordered this encounter  Medications  . meloxicam (MOBIC) 15 MG tablet    Sig: Take 1 tablet (15 mg total) by mouth daily.    Dispense:  14 tablet    Refill:  0  . cyclobenzaprine (FLEXERIL) 10 MG tablet    Sig: Take 1 tablet (10 mg total) by mouth 3 (three) times daily as needed for muscle spasms.    Dispense:  45 tablet    Refill:  0     Elberta Leatherwood, MD,MS,  PGY2 06/24/2016 5:47 PM

## 2016-07-04 ENCOUNTER — Ambulatory Visit (INDEPENDENT_AMBULATORY_CARE_PROVIDER_SITE_OTHER): Payer: Medicare Other | Admitting: Family

## 2016-07-04 ENCOUNTER — Encounter: Payer: Self-pay | Admitting: Family

## 2016-07-04 ENCOUNTER — Ambulatory Visit (HOSPITAL_COMMUNITY)
Admission: RE | Admit: 2016-07-04 | Discharge: 2016-07-04 | Disposition: A | Discharge status: Home / Self Care | Payer: Medicare Other | Source: Ambulatory Visit | Attending: Family | Admitting: Family

## 2016-07-04 ENCOUNTER — Ambulatory Visit (INDEPENDENT_AMBULATORY_CARE_PROVIDER_SITE_OTHER)
Admission: RE | Admit: 2016-07-04 | Discharge: 2016-07-04 | Disposition: A | Discharge status: Home / Self Care | Payer: Medicare Other | Source: Ambulatory Visit | Attending: Family | Admitting: Family

## 2016-07-04 VITALS — BP 169/88 | HR 84 | Temp 97.6°F | Ht 67.0 in | Wt 203.6 lb

## 2016-07-04 DIAGNOSIS — I779 Disorder of arteries and arterioles, unspecified: Secondary | ICD-10-CM

## 2016-07-04 DIAGNOSIS — Z7984 Long term (current) use of oral hypoglycemic drugs: Secondary | ICD-10-CM | POA: Insufficient documentation

## 2016-07-04 DIAGNOSIS — Z720 Tobacco use: Secondary | ICD-10-CM

## 2016-07-04 DIAGNOSIS — I10 Essential (primary) hypertension: Secondary | ICD-10-CM | POA: Insufficient documentation

## 2016-07-04 DIAGNOSIS — E119 Type 2 diabetes mellitus without complications: Secondary | ICD-10-CM | POA: Insufficient documentation

## 2016-07-04 DIAGNOSIS — E785 Hyperlipidemia, unspecified: Secondary | ICD-10-CM | POA: Insufficient documentation

## 2016-07-04 DIAGNOSIS — Z4889 Encounter for other specified surgical aftercare: Secondary | ICD-10-CM | POA: Diagnosis not present

## 2016-07-04 DIAGNOSIS — G8918 Other acute postprocedural pain: Secondary | ICD-10-CM | POA: Insufficient documentation

## 2016-07-04 DIAGNOSIS — Z95828 Presence of other vascular implants and grafts: Secondary | ICD-10-CM

## 2016-07-04 DIAGNOSIS — K219 Gastro-esophageal reflux disease without esophagitis: Secondary | ICD-10-CM | POA: Diagnosis not present

## 2016-07-04 DIAGNOSIS — F17290 Nicotine dependence, other tobacco product, uncomplicated: Secondary | ICD-10-CM

## 2016-07-04 NOTE — Progress Notes (Signed)
[facility] [place]   CC: Follow up peripheral artery occlusive disease  History of Present Illness [name] is a 65 y.o. male patient of Dr. Kellie Simmering who is s/p: Left CFA to below knee popliteal bypass graft with left saphenous vein on 01/06/16 by Dr. Kellie Simmering, assisted by Dr. Donnetta Hutching.  He returns today for 3 months follow up. His left groin incision is well healed, his left lower leg incision is well healed with prior separated area on lower leg incision completely granulated in.  The swelling in his left leg has returned. At his 02/09/16 visit, his left leg had moderate swelling and subsequent separation of several areas of his left leg medial incision and left groin incision due to swelling; it seemed that he was not elevating his legs adequately to promote venous return. He was advised to elevate his leg as much as possible when not walking, his foot above his heart with his back flat. He was also advised to elevate the foot of his bed to let gravity help the swelling at night.  Pt states his left lower leg feels no different since surgery: burning sensation posterior thigh to toes.  He states his right posterior thigh and calf hurt after taking a couple of steps.  He denies non healing wounds. His surgical incisions have healed.  Pt Diabetic: Yes, 6.7 A1C on 06/24/16 (review of records) Pt smoker: 2 cigars/day  Pt meds include: Statin :Yes Betablocker: Yes ASA: Yes Other anticoagulants/antiplatelets: Plavix, Pletal  Past Medical History  Diagnosis Date  . Hyperlipidemia     takes Vytorin daily  . Arthritis     OA  . Cervical radiculopathy     Dr. Vertell Limber neurosurgery  . PAD (peripheral artery disease) ([place])   . Tobacco user   . Chronic lower back pain   . Toe fracture, right 05/09/2011  . Shortness of breath dyspnea     with exertion  . Anemia   . History of blood transfusion     "related to low HgB" ((09/10/2015  . Pneumonia   . Hypertension    takes Benazepril and Bystolic daily  . GERD (gastroesophageal reflux disease)     takes Protonix daily  . Diabetes mellitus     takes Metformin daily    Social History Social History  Substance Use Topics  . Smoking status: Current Some Day Smoker -- 24 years    Types: Cigars  . Smokeless tobacco: Never Used     Comment: smokes 1-2 cigars per day   . Alcohol Use: 7.8 oz/week    7 Cans of beer, 6 Shots of liquor per week    Family History Family History  Problem Relation Age of Onset  . Cancer Mother     colon Cancer  . Heart disease Father   . Hypertension Father     Past Surgical History  Procedure Laterality Date  . Anterior cervical decomp/discectomy fusion  03/08/12    C6-7  . Vascular surgery  ~ 2007    Stent SFA   . Back surgery  1996  . Lumbar disc surgery  1996    "lower"  . Inguinal hernia repair  1990's    right  . Anterior cervical decomp/discectomy fusion  03/08/2012    Procedure: ANTERIOR CERVICAL DECOMPRESSION/DISCECTOMY FUSION 1 LEVEL/HARDWARE REMOVAL;  Surgeon: Erline Levine, MD;  Location: [facility];  Service: Neurosurgery;  Laterality: N/A;  revison of C5-7 anterior cervical decompression with fusion with Cervical Five-Thoracic One anterior  cervical decompression with fusion with interbody prothesis plating and bonegraft  . Colonoscopy w/ biopsies and polypectomy  08/17/2012    f/u 5 years, 4 polyps, no high grade dysplasia or malignancy, tubular adenoma, hyperplastic polyops  . Esophagogastroduodenoscopy  08/17/2012    normal esophagus and GEJ, diffuse gastritis with erythema- no malignancy, reactive gastropathy  with focal intestinal metaplasia  . Peripheral vascular catheterization N/A 07/30/2015    Procedure: Abdominal Aortogram;  Surgeon: Conrad [place]ne [place], MD;  Location: [place] [facility];  Service: Cardiovascular;  Laterality: N/A;  . Lower extremity angiogram Bilateral 07/30/2015    Procedure: Lower Extremity Angiogram;  Surgeon: Conrad [place], MD;   Location: [place] [facility];  Service: Cardiovascular;  Laterality: Bilateral;  . Givens capsule study N/A 11/24/2015    Procedure: GIVENS CAPSULE STUDY;  Surgeon: Juanita Craver, MD;  Location: [facility];  Service: Endoscopy;  Laterality: N/A;  . Enteroscopy N/A 12/11/2015    Procedure: ENTEROSCOPY;  Surgeon: Carol Ada, MD;  Location: [facility];  Service: Endoscopy;  Laterality: N/A;  . Femoral-popliteal bypass graft Left 01/06/2016    Procedure: Left  COMMON FEMORAL-BELOW KNEE POPLITEAL ARTERY Bypass using non-reversed translocated saphenous vein graft from left leg;  Surgeon: Mal Misty, MD;  Location: [place];  Service: Vascular;  Laterality: Left;  . Intraoperative arteriogram Left 01/06/2016    Procedure: INTRA OPERATIVE ARTERIOGRAM LEFT LOWER LEG;  Surgeon: Mal Misty, MD;  Location: [place];  Service: Vascular;  Laterality: Left;  . Vein harvest Left 01/06/2016    Procedure: LEFT GREATER SAPPHENOUS VEIN HARVEST;  Surgeon: Mal Misty, MD;  Location: [facility];  Service: Vascular;  Laterality: Left;    Allergies  Allergen Reactions  . Glipizide Other (See Comments)    Severe hypoglycemia to 40s.     Current Outpatient Prescriptions  Medication Sig Dispense Refill  . acetaminophen (TYLENOL) 500 MG tablet Take 1,000 mg by mouth every 6 (six) hours as needed for moderate pain.    Marland Kitchen aspirin EC 81 MG tablet Take 162 mg by mouth daily.    . benazepril (LOTENSIN) 5 MG tablet Take 1 tablet (5 mg total) by mouth daily. 90 tablet 3  . cilostazol (PLETAL) 100 MG tablet Take 1 tablet (100 mg total) by mouth 2 (two) times daily. 60 tablet 5  . clopidogrel (PLAVIX) 75 MG tablet TAKE 1 TABLET EVERY DAY 90 tablet 3  . cyclobenzaprine (FLEXERIL) 10 MG tablet Take 1 tablet (10 mg total) by mouth 3 (three) times daily as needed for muscle spasms. 45 tablet 0  . docusate sodium (COLACE) 100 MG capsule Take 1 capsule (100 mg total) by mouth 2 (two) times daily. 60 capsule 1  . ezetimibe (ZETIA)  10 MG tablet Take 1 tablet (10 mg total) by mouth daily. 90 tablet 3  . ferrous sulfate (FERROUSUL) 325 (65 FE) MG tablet Take 1 tablet (325 mg total) by mouth daily with breakfast. 30 tablet 1  . gabapentin (NEURONTIN) 800 MG tablet Take 1.5 tablets (1,200 mg total) by mouth 3 (three) times daily. 135 tablet 3  . meloxicam (MOBIC) 15 MG tablet Take 1 tablet (15 mg total) by mouth daily. 14 tablet 0  . metFORMIN (GLUCOPHAGE) 500 MG tablet Take 500 mg by mouth daily.    . Nebivolol HCl (BYSTOLIC) 20 MG TABS Take 1 tablet (20 mg total) by mouth daily. 30 tablet 11  . pantoprazole (PROTONIX) 40 MG tablet Take 1 tablet (40 mg total) by mouth daily. [place]  tablet 11  . simvastatin (ZOCOR) 40 MG tablet Take 1 tablet (40 mg total) by mouth at bedtime. 90 tablet 3  . traMADol (ULTRAM) 50 MG tablet Take 1-2 tablets by mouth every 6 hours as needed for pain 60 tablet 0   No current facility-administered medications for this visit.    ROS: See HPI for pertinent positives and negatives.   Physical Examination  Filed Vitals:   07/04/16 1157 07/04/16 1158  BP: 184/88 169/88  Pulse: 84   Temp: 97.6 F (36.4 C)   TempSrc: Oral   Height: 5\' 7"  (1.702 m)   Weight: 203 lb 9.6 oz (92.352 kg)   SpO2: 100%    Body mass index is 31.88 kg/(m^2).  General: A&O x 3, WDWN, obese male. Gait: normal Eyes: PERRLA. Pulmonary: CTAB, without wheezes , rales or rhonchi. Cardiac: regular rhythm, no detected murmur.         Carotid Bruits Right Left   Negative Negative  Aorta is not palpable. Radial pulses: 2+ palpable and =                           VASCULAR EXAM: Extremities without ischemic changes, without Gangrene; without open wounds.                                                                                                          LE Pulses Right Left       FEMORAL  not palpable (obese)  not palpable (obese)        POPLITEAL  not palpable  brisk Doppler signal       POSTERIOR TIBIAL   monophasic by Doppler   fairly brisk Doppler signal        DORSALIS PEDIS      ANTERIOR TIBIAL monophasic by Doppler   monophasic by Doppler    Abdomen: soft, NT, no masses. Skin: no rashes, no ulcers. Musculoskeletal: no muscle wasting or atrophy.  Neurologic: A&O X 3; Appropriate Affect ; SENSATION: normal; MOTOR FUNCTION:  moving all extremities equally, motor strength 5/5 throughout. Speech is fluent/normal. CN 2-12 intact.    Non-Invasive Vascular Imaging: DATE: 07/04/2016  LOWER EXTREMITY ARTERIAL DUPLEX EVALUATION    INDICATION: Follow up bypass graft.    PREVIOUS INTERVENTION(S): Left common femoral to below knee popliteal bypass graft 01/06/2016 with non-reversed saphenous vein    DUPLEX EXAM:     RIGHT  LEFT   Peak Systolic Velocity (cm/s) Ratio (if abnormal) Waveform  Peak Systolic Velocity (cm/s) Ratio (if abnormal) Waveform     Inflow Artery 152  B     Proximal Anastomosis 159  Brisk M     Proximal Graft 29  Brisk M     Mid Graft 22  Brisk M      Distal Graft NV  -     Distal Anastomosis NV  -     Outflow Artery VV  -  0.56 Today's ABI / TBI 0.43  0.56 Previous ABI / TBI ( 03/15/16 ) 0.75  Waveform:    M - Monophasic       B - Biphasic       T - Triphasic  If Ankle Brachial Index (ABI) or Toe Brachial Index (TBI) performed, please see complete report     ADDITIONAL FINDINGS:     IMPRESSION: 1. Suboptimal visualization due to  depth of graft and edema on the left 2. Abnormal brisk monophasic waveforms in the left proximal to mid graft area ( possible pre-occlusive) with no visualized graft on distally suggestive of occlusion or near occlusion. There is mild flow noted in the popliteal artery    Compared to the previous exam:  Possible occlusion since exam of 03/15/2016      ABI:  RIGHT: 0.56 (0.56, 03/15/16), Waveforms: monophasic; TBI: 0.72, toes pressure: 117   LEFT: 0.43 (0.75), Waveforms: monophasic, TBI: 0.36, toe pressure: 59   ASSESSMENT: [name] is a 65 y.o. male who is s/p left CFA to below knee popliteal bypass graft with left saphenous vein on 01/06/16. Pt states his left lower leg feels no different since surgery: burning sensation posterior thigh to toes, denies any worsening of claudication sx's. Pt has no signs of ischemia in his feet/legs. Noted that he has chronic low back pain. He states his right posterior thigh and calf hurt after taking a couple of steps.  Dr. Kellie Simmering spoke with and examined pt. Doppler signal in his left PT was fairly brisk.  On 03/15/16 the left LE arterial duplex indicated a patent left LE bypass graft with elevated velocities at the distal anastomosis; however, due to edema and incision, 2D imaging is sub-optimal and disease is not well visualized. Today's left LE duplex also indicated suboptimal visualization due to depth of graft and edema on the left. Abnormal brisk monophasic waveforms in the left proximal to mid graft area ( possible pre-occlusive) with no visualized graft on distally suggestive of occlusion or near occlusion. There is mild flow noted in the popliteal artery.  ABI is stable in the right at 56%, left ABI dropped from 75% to 43% in 3 months.  His atherosclerotic risk factors include cigar smoking, controlled DM, obesity, and dyslipidemia. He is on a statin, ASA, Plavix, and Pletal.    PLAN:  Based on the patient's vascular studies and examination, and after discussing with Dr. Kellie Simmering, pt was advised to be scheduled for an arteriogram with bilateral run off, possible intervention, by Dr. Trula Slade. However, pt refused this procedure. I advised him that we are trying to prevent the bypass graft in his left leg from becoming occluded; he states he did not want any more procedures. I discussed in depth with the patient the nature of atherosclerosis, and emphasized the importance of maximal medical management including strict control of blood pressure, blood glucose, and lipid  levels, obtaining regular exercise, and cessation of smoking.  The patient is aware that without maximal medical management the underlying atherosclerotic disease process will progress, limiting the benefit of any interventions.  The patient was given information about PAD including signs, symptoms, treatment, what symptoms should prompt the patient to seek immediate medical care, and risk reduction measures to take.  Clemon Chambers, RN, MSN, FNP-C [facility] Phone: [phone]  Clinic MD: Kellie Simmering  07/04/2016 12:33 PM

## 2016-07-04 NOTE — Patient Instructions (Signed)
Peripheral Vascular Disease Peripheral vascular disease (PVD) is a disease of the blood vessels that are not part of your heart and brain. A simple term for PVD is poor circulation. In most cases, PVD narrows the blood vessels that carry blood from your heart to the rest of your body. This can result in a decreased supply of blood to your arms, legs, and internal organs, like your stomach or kidneys. However, it most often affects a person's lower legs and feet. There are two types of PVD.  Organic PVD. This is the more common type. It is caused by damage to the structure of blood vessels.  Functional PVD. This is caused by conditions that make blood vessels contract and tighten (spasm). Without treatment, PVD tends to get worse over time. PVD can also lead to acute ischemic limb. This is when an arm or limb suddenly has trouble getting enough blood. This is a medical emergency. CAUSES Each type of PVD has many different causes. The most common cause of PVD is buildup of a fatty material (plaque) inside of your arteries (atherosclerosis). Small amounts of plaque can break off from the walls of the blood vessels and become lodged in a smaller artery. This blocks blood flow and can cause acute ischemic limb. Other common causes of PVD include:  Blood clots that form inside of blood vessels.  Injuries to blood vessels.  Diseases that cause inflammation of blood vessels or cause blood vessel spasms.  Health behaviors and health history that increase your risk of developing PVD. RISK FACTORS  You may have a greater risk of PVD if you:  Have a family history of PVD.  Have certain medical conditions, including:  High cholesterol.  Diabetes.  High blood pressure (hypertension).  Coronary heart disease.  Past problems with blood clots.  Past injury, such as burns or a broken bone. These may have damaged blood vessels in your limbs.  Buerger disease. This is caused by inflamed blood  vessels in your hands and feet.  Some forms of arthritis.  Rare birth defects that affect the arteries in your legs.  Use tobacco.  Do not get enough exercise.  Are obese.  Are age 50 or older. SIGNS AND SYMPTOMS  PVD may cause many different symptoms. Your symptoms depend on what part of your body is not getting enough blood. Some common signs and symptoms include:  Cramps in your lower legs. This may be a symptom of poor leg circulation (claudication).  Pain and weakness in your legs while you are physically active that goes away when you rest (intermittent claudication).  Leg pain when at rest.  Leg numbness, tingling, or weakness.  Coldness in a leg or foot, especially when compared with the other leg.  Skin or hair changes. These can include:  Hair loss.  Shiny skin.  Pale or bluish skin.  Thick toenails.  Inability to get or maintain an erection (erectile dysfunction). People with PVD are more prone to developing ulcers and sores on their toes, feet, or legs. These may take longer than normal to heal. DIAGNOSIS Your health care provider may diagnose PVD from your signs and symptoms. The health care provider will also do a physical exam. You may have tests to find out what is causing your PVD and determine its severity. Tests may include:  Blood pressure recordings from your arms and legs and measurements of the strength of your pulses (pulse volume recordings).  Imaging studies using sound waves to take pictures of   the blood flow through your blood vessels (Doppler ultrasound).  Injecting a dye into your blood vessels before having imaging studies using:  X-rays (angiogram or arteriogram).  Computer-generated X-rays (CT angiogram).  A powerful electromagnetic field and a computer (magnetic resonance angiogram or MRA). TREATMENT Treatment for PVD depends on the cause of your condition and the severity of your symptoms. It also depends on your age. Underlying  causes need to be treated and controlled. These include long-lasting (chronic) conditions, such as diabetes, high cholesterol, and high blood pressure. You may need to first try making lifestyle changes and taking medicines. Surgery may be needed if these do not work. Lifestyle changes may include:  Quitting smoking.  Exercising regularly.  Following a low-fat, low-cholesterol diet. Medicines may include:  Blood thinners to prevent blood clots.  Medicines to improve blood flow.  Medicines to improve your blood cholesterol levels. Surgical procedures may include:  A procedure that uses an inflated balloon to open a blocked artery and improve blood flow (angioplasty).  A procedure to put in a tube (stent) to keep a blocked artery open (stent implant).  Surgery to reroute blood flow around a blocked artery (peripheral bypass surgery).  Surgery to remove dead tissue from an infected wound on the affected limb.  Amputation. This is surgical removal of the affected limb. This may be necessary in cases of acute ischemic limb that are not improved through medical or surgical treatments. HOME CARE INSTRUCTIONS  Take medicines only as directed by your health care provider.  Do not use any tobacco products, including cigarettes, chewing tobacco, or electronic cigarettes. If you need help quitting, ask your health care provider.  Lose weight if you are overweight, and maintain a healthy weight as directed by your health care provider.  Eat a diet that is low in fat and cholesterol. If you need help, ask your health care provider.  Exercise regularly. Ask your health care provider to suggest some good activities for you.  Use compression stockings or other mechanical devices as directed by your health care provider.  Take good care of your feet.  Wear comfortable shoes that fit well.  Check your feet often for any cuts or sores. SEEK MEDICAL CARE IF:  You have cramps in your legs  while walking.  You have leg pain when you are at rest.  You have coldness in a leg or foot.  Your skin changes.  You have erectile dysfunction.  You have cuts or sores on your feet that are not healing. SEEK IMMEDIATE MEDICAL CARE IF:  Your arm or leg turns cold and blue.  Your arms or legs become red, warm, swollen, painful, or numb.  You have chest pain or trouble breathing.  You suddenly have weakness in your face, arm, or leg.  You become very confused or lose the ability to speak.  You suddenly have a very bad headache or lose your vision.   This information is not intended to replace advice given to you by your health care provider. Make sure you discuss any questions you have with your health care provider.   Document Released: 01/19/2005 Document Revised: 01/02/2015 Document Reviewed: 05/22/2014 Elsevier Interactive Patient Education 2016 [employer]  

## 2016-07-07 ENCOUNTER — Other Ambulatory Visit: Payer: Self-pay | Admitting: Family Medicine

## 2016-07-13 ENCOUNTER — Telehealth: Payer: Self-pay | Admitting: Surgery

## 2016-07-13 NOTE — Telephone Encounter (Signed)
Sched appt w/ [facility] 8/14 at 10:00. Lm on hm# to inform pt of appt.

## 2016-07-13 NOTE — Telephone Encounter (Signed)
----- Message from Viann Fish, NP sent at 07/13/2016  1:17 PM EDT ----- Regarding: FW: follow up for this pt Colletta Maryland, Schedulers,  Please see Dr. Stephens Shire message. Please call pt and advise him that he needs to see Dr. Trula Slade or Dr. Donnetta Hutching, and try to schedule him to see Dr. Trula Slade or Dr. Donnetta Hutching. Thank you, Vinnie Level  ----- Message -----    From: Serafina Mitchell, MD    Sent: 07/13/2016  10:34 AM      To: Sharmon Leyden Nickel, NP Subject: RE: follow up for this pt                      Sounds like he needs an angiogram, which is what Dr. Kellie Simmering has already told him.  I would schedule him for this and if he refuses again, he can be seen in the office by one of [place] to further discuss this ----- Message -----    From: Viann Fish, NP    Sent: 07/04/2016   9:20 PM      To: Rosetta Posner, MD, Elam Dutch, MD, # Subject: follow up for this pt                          Dr. Oneida Alar, Dr. Trula Slade, Dr. Donnetta Hutching,  This pt was advised to have an arteriogram since his left ABI dropped in 3-4 months from 0.75 to 0.43, see below. He refused the procedure and left after speaking with Colletta Maryland. He told me he did not want any more procedures.  He has no follow up scheduled since he left. What should we advise him by phone in regards to follow up? Return in a couple of weeks and speak with Dr. Donnetta Hutching or Dr. Trula Slade?  Thank you, [name] is a 65 y.o. male who is s/p left CFA to below knee popliteal bypass graft with left saphenous vein on 01/06/16 by  Dr. Kellie Simmering, assisted by Dr. Donnetta Hutching. Pt states his left lower leg feels no different since surgery: burning sensation posterior thigh to toes, denies any worsening of claudication sx's. Pt has no signs of ischemia in his feet/legs. Noted that he has chronic low back pain. He states his right posterior thigh and calf hurt after taking a couple of steps.  Dr. Kellie Simmering spoke with and examined pt. Doppler signal in his left PT was fairly  brisk.  On 03/15/16 the left LE arterial duplex indicated a patent left LE bypass graft with elevated velocities at the distal anastomosis; however, due to edema and incision, 2D imaging is sub-optimal and disease is not well visualized. Today's left LE duplex also indicated suboptimal visualization due to depth of graft and edema on the left. Abnormal brisk monophasic waveforms in the left proximal to mid graft area ( possible pre-occlusive) with no visualized graft on distally suggestive of occlusion or near occlusion. There is mild flow noted in the popliteal artery.  ABI is stable in the right at 56%, left ABI dropped from 75% to 43% in 3 months.    PLAN:  Based on the patient's vascular studies and examination, and after discussing with Dr. Kellie Simmering, pt was advised to be scheduled for an arteriogram with bilateral run off, possible intervention, by Dr. Trula Slade. However, pt refused this procedure. I advised him that we are trying to prevent the bypass graft in his left leg from becoming occluded; he states he did not  want any more procedures.

## 2016-07-15 ENCOUNTER — Telehealth: Payer: Self-pay | Admitting: Family Medicine

## 2016-07-15 NOTE — Telephone Encounter (Signed)
Patient requesting refill on flexeril and meloxicam, has follow up scheduled with PCP on 7/27.

## 2016-07-15 NOTE — Telephone Encounter (Signed)
Pt was given a new medication at last visit and he is out. He would like a refill to be sent in to [facility] and to call him after rx has been sent in [phone] ep

## 2016-07-15 NOTE — Addendum Note (Signed)
Addended by: Junious Dresser on: 07/15/2016 09:54 AM   Modules accepted: Orders

## 2016-07-17 MED ORDER — CYCLOBENZAPRINE HCL 10 MG PO TABS: 10.0000 mg | ORAL_TABLET | Freq: Three times a day (TID) | ORAL | 0 refills | Status: DC | PRN

## 2016-07-21 ENCOUNTER — Encounter: Payer: Self-pay | Admitting: Family Medicine

## 2016-07-21 ENCOUNTER — Ambulatory Visit (INDEPENDENT_AMBULATORY_CARE_PROVIDER_SITE_OTHER): Payer: Medicare Other | Admitting: Family Medicine

## 2016-07-21 DIAGNOSIS — M545 Low back pain, unspecified: Secondary | ICD-10-CM

## 2016-07-21 DIAGNOSIS — I779 Disorder of arteries and arterioles, unspecified: Secondary | ICD-10-CM | POA: Diagnosis present

## 2016-07-21 MED ORDER — TRAMADOL HCL 50 MG PO TABS: 50.0000 mg | ORAL_TABLET | Freq: Two times a day (BID) | ORAL | 1 refills | Status: DC | PRN

## 2016-07-21 NOTE — Patient Instructions (Signed)
It was a pleasure seeing you today in our clinic. Today we discussed your back pain. Here is the treatment plan we have discussed and agreed upon together:   - I've refilled your prescription of tramadol for ear pain. Take this up to twice a day as needed. - I would encourage you to try to control your pain with over-the-counter medications like Tylenol or Aleve. If possible try to stagger these medications by taking Tylenol up to 3 times a day with a dose of Aleve between each Tylenol dose. For a total of 3 Tylenol doses and 2 doses of Aleve in 1 day. - Follow-up as needed.

## 2016-07-21 NOTE — Assessment & Plan Note (Signed)
Improved: Patient states that his back pain has been significantly improved over the past few weeks. He continues to do his best to stay active despite symptoms of claudication in his legs. No red flag symptoms at this time. - Continue staying active - Attempt weight loss - Tramadol when necessary for pain

## 2016-07-21 NOTE — Progress Notes (Signed)
   HPI  CC: Back pain follow-up Patient is here for a follow-up on his back and leg pain. He states he has been doing well overall. Since last time I saw him he was prescribed some muscle relaxants for some the cramping like pain he was experiencing. He states that he has been doing much better since taking that medication. He is trying to stay active and walks 4-6 blocks every day. He states that he still experiences symptoms of claudication almost immediately after initiating activity but he is able to push through much of that discomfort to complete his necessary exercise/walking routine.  Review of Systems   See HPI for ROS. All other systems reviewed and are negative.  CC, SH/smoking status, and VS noted  Objective: BP 115/64 (BP Location: Left Arm, Patient Position: Sitting, Cuff Size: Normal)   Pulse 97   Temp 98.3 F (36.8 C) (Oral)   Ht 5\' 7"  (1.702 m)   Wt 205 lb 3.2 oz (93.1 kg)   BMI 32.14 kg/m  Gen: NAD, alert, cooperative, and pleasant. HEENT: NCAT, EOMI, PERRL CV: RRR, no murmur Resp: CTAB, no wheezes, non-labored Ext: No edema, warm, healing surgical scars noted, Neg SLR Neuro: Alert and oriented, Speech clear, No gross deficits, sensation intact bilat  Assessment and plan:  Lumbar back pain Improved: Patient states that his back pain has been significantly improved over the past few weeks. He continues to do his best to stay active despite symptoms of claudication in his legs. No red flag symptoms at this time. - Continue staying active - Attempt weight loss - Tramadol when necessary for pain   Meds ordered this encounter  Medications  . traMADol (ULTRAM) 50 MG tablet    Sig: Take 1 tablet (50 mg total) by mouth every 12 (twelve) hours as needed for moderate pain.    Dispense:  60 tablet    Refill:  1     Elberta Leatherwood, MD,MS,  PGY3 07/21/2016 6:41 PM

## 2016-08-04 ENCOUNTER — Encounter: Payer: Self-pay | Admitting: Surgery

## 2016-08-08 ENCOUNTER — Encounter: Payer: Self-pay | Admitting: Surgery

## 2016-08-08 ENCOUNTER — Ambulatory Visit (INDEPENDENT_AMBULATORY_CARE_PROVIDER_SITE_OTHER): Payer: Self-pay | Admitting: Surgery

## 2016-08-08 ENCOUNTER — Ambulatory Visit: Payer: Medicare Other | Admitting: Surgery

## 2016-08-08 VITALS — BP 140/77 | HR 95 | Ht 67.0 in | Wt 205.3 lb

## 2016-08-08 DIAGNOSIS — I779 Disorder of arteries and arterioles, unspecified: Secondary | ICD-10-CM

## 2016-08-09 NOTE — Progress Notes (Signed)
Left without being seen  WB

## 2016-08-10 ENCOUNTER — Other Ambulatory Visit: Payer: Self-pay | Admitting: Family Medicine

## 2016-09-11 ENCOUNTER — Other Ambulatory Visit: Payer: Self-pay | Admitting: Family Medicine

## 2016-09-13 ENCOUNTER — Ambulatory Visit (INDEPENDENT_AMBULATORY_CARE_PROVIDER_SITE_OTHER): Payer: Medicare Other | Admitting: *Deleted

## 2016-09-13 DIAGNOSIS — Z23 Encounter for immunization: Secondary | ICD-10-CM | POA: Diagnosis present

## 2016-09-13 NOTE — Progress Notes (Signed)
   Alecxander J Rumberger presents for immunizations.    Screening questions for immunizations: 1. Are you sick today?  no 2. Do you have allergies to medications, foods, or any vaccines?  no 3. Have you ever had a serious reaction after receiving a vaccination?  no 4. Do you have a long-term health problem with heart disease, asthma, lung disease, kidney disease, metabolic disease (e.g. diabetes), anemia, or other blood disorder?  no 5. Have you had a seizure, brain problem, or other nervous system problem?  no 6. Do you have cancer, leukemia, AIDS, or any other immune system problem?  no 7. Do you take cortisone, prednisone, other steroids, anticancer drugs or have you had radiation treatments?  no 8. Have you received a transfusion of blood or blood products, or been given immune (gamma) globulin or an antiviral drug in the past year?  no 9. Have you received vaccinations in the past 4 weeks?  no 10. FEMALES ONLY: Are you pregnant or is there a chance you could become pregnant during the next month?  no   Takeshi Teasdale L, RN  

## 2016-09-15 ENCOUNTER — Other Ambulatory Visit: Payer: Self-pay | Admitting: Family Medicine

## 2016-09-15 ENCOUNTER — Other Ambulatory Visit: Payer: Self-pay | Admitting: *Deleted

## 2016-09-16 MED ORDER — TRAMADOL HCL 50 MG PO TABS: 50.0000 mg | ORAL_TABLET | Freq: Two times a day (BID) | ORAL | 1 refills | Status: DC | PRN

## 2016-09-16 NOTE — Telephone Encounter (Signed)
Rx up front

## 2016-09-16 NOTE — Telephone Encounter (Signed)
2nd request.  Martin, Tamika L, RN  

## 2016-10-03 ENCOUNTER — Ambulatory Visit (INDEPENDENT_AMBULATORY_CARE_PROVIDER_SITE_OTHER): Payer: Medicare Other | Admitting: Family Medicine

## 2016-10-03 ENCOUNTER — Encounter: Payer: Self-pay | Admitting: Family Medicine

## 2016-10-03 VITALS — BP 127/71 | HR 111 | Temp 97.8°F | Ht 67.0 in | Wt 201.4 lb

## 2016-10-03 DIAGNOSIS — F329 Major depressive disorder, single episode, unspecified: Secondary | ICD-10-CM | POA: Diagnosis not present

## 2016-10-03 DIAGNOSIS — L03116 Cellulitis of left lower limb: Secondary | ICD-10-CM | POA: Diagnosis not present

## 2016-10-03 DIAGNOSIS — I779 Disorder of arteries and arterioles, unspecified: Secondary | ICD-10-CM | POA: Diagnosis not present

## 2016-10-03 DIAGNOSIS — F32A Depression, unspecified: Secondary | ICD-10-CM

## 2016-10-03 DIAGNOSIS — Z79899 Other long term (current) drug therapy: Secondary | ICD-10-CM | POA: Diagnosis not present

## 2016-10-03 DIAGNOSIS — E119 Type 2 diabetes mellitus without complications: Secondary | ICD-10-CM | POA: Diagnosis present

## 2016-10-03 DIAGNOSIS — M79675 Pain in left toe(s): Secondary | ICD-10-CM | POA: Diagnosis not present

## 2016-10-03 LAB — POCT GLYCOSYLATED HEMOGLOBIN (HGB A1C): Hemoglobin A1C: 7.6

## 2016-10-03 MED ORDER — FLUOXETINE HCL 10 MG PO CAPS: ORAL_CAPSULE | ORAL | 3 refills | Status: DC

## 2016-10-03 MED ORDER — DOXYCYCLINE HYCLATE 100 MG PO TABS: 100.0000 mg | ORAL_TABLET | Freq: Two times a day (BID) | ORAL | 0 refills | Status: DC

## 2016-10-03 NOTE — Assessment & Plan Note (Addendum)
Patient is here with signs and symptoms consistent with new cellulitis of the left foot. Differential includes gout versus acute worsening of PVD versus DVT/graft occlusion. Will obtain uric acid to help assess for gout (no history of this condition), capillary refill is reassuring if patient has had any worsening in his PVD or his graft is now occluded. Patient is already seen by vascular surgery at this time. Will treat for cellulitis. - Doxycycline twice a day 10 days - Will forward note to patient's vascular surgeon - Follow-up one week. Informed patient to cancel appointment if he notices significant improvement

## 2016-10-03 NOTE — Patient Instructions (Signed)
It was a pleasure seeing you today in our clinic. Today we discussed your foot pain and depression. Here is the treatment plan we have discussed and agreed upon together:   - I believe you have a skin infection called cellulitis. For this I will treat you with doxycycline. Take 1 tablet twice a day until the bottle is empty. - For your depression I have started you on fluoxetine. This medication should help with both your mood as well as some of your pain. Take 1 tablet daily for the first week then increase this to 2 tablets daily after that. This medication occasionally take some time to build up in the system. It is not uncommon for patients to feel little benefit for the first 4-5 weeks. - I would like to have a follow-up with me or one of my colleagues within the next week. Please set this appointment up before leaving today. If you notice improvement in the pain in your foot -- you may cancel this appointment.

## 2016-10-03 NOTE — Assessment & Plan Note (Signed)
Patient is endorsing new symptoms of depression. PHQ9 obtained today yielded a score of 19. Patient states that most of this is stemming from his chronic left leg pain. I discussed with him some of the pros and cons of initiating antidepressant therapy. I informed him that these can occasionally help with chronic pain symptoms. Patient stated his understanding and willingness to initiate therapy. - Fluoxetine 10 mg 7 days. Then 20 mg daily. - Follow-up in 4 weeks to reassess with repeat PHQ9

## 2016-10-03 NOTE — Progress Notes (Signed)
HPI  CC: Left foot pain and depression Patient is here to discuss his left foot pain. Patient has a past medical history significant for PVD and diabetes. He recently had a popliteal bypass graft on 01/06/16 by vascular surgery on the left side. He states that since that surgery he has had this ongoing pain. It only just recently, 1-2 weeks, seemed to get a little worse. He notes significant tenderness along the medial aspect of his foot. This pain is in addition to his baseline neuropathic pain that extends from his knee down to his foot. He endorses swelling of the affected foot and tenderness to palpation. He denies any systemic symptoms of nausea, vomiting, diarrhea, headaches, diaphoresis, fevers, chills.  Depression: Patient endorses worsening depression over the past month. He states that the pain that he is experiencing has been bothering him more and more recently. The pain keeps him up at night and he is not able to sleep the way he used to. The limitations the pain has placed on his body have been weighing on him. He denies any SI/HI. He endorses chronic fatigue and feeling bad about himself. He is interested in medical management at this time. He endorses issues with concentration. Denies any new irritability or hallucinations.  Review of Systems   See HPI for ROS. All other systems reviewed and are negative.  CC, SH/smoking status, and VS noted  Objective: BP 127/71   Pulse (!) 111   Temp 97.8 F (36.6 C) (Oral)   Ht 5\' 7"  (1.702 m)   Wt 201 lb 6.4 oz (91.4 kg)   BMI 31.54 kg/m  Gen: NAD, alert, cooperative, slightly melancholy CV: RRR, no murmur Resp: CTAB, no wheezes, non-labored Neuro: Alert and oriented, Speech clear, No gross deficits  Left foot: Obvious swelling when compared to the right, darker/reddish appearance compared to the right. TTP at the navicular and extending distally towards the first MTP. Range of motion intact throughout. Strength 5/5 throughout. +2  pitting edema. Capillary refill intact throughout. Sensation intact throughout. [See photos below]      Assessment and plan:  Cellulitis of leg, left Patient is here with signs and symptoms consistent with new cellulitis of the left foot. Differential includes gout versus acute worsening of PVD versus DVT/graft occlusion. Will obtain uric acid to help assess for gout (no history of this condition), capillary refill is reassuring if patient has had any worsening in his PVD or his graft is now occluded. Patient is already seen by vascular surgery at this time. Will treat for cellulitis. - Doxycycline twice a day 10 days - Will forward note to patient's vascular surgeon - Follow-up one week. Informed patient to cancel appointment if he notices significant improvement  Depression Patient is endorsing new symptoms of depression. PHQ9 obtained today yielded a score of 19. Patient states that most of this is stemming from his chronic left leg pain. I discussed with him some of the pros and cons of initiating antidepressant therapy. I informed him that these can occasionally help with chronic pain symptoms. Patient stated his understanding and willingness to initiate therapy. - Fluoxetine 10 mg 7 days. Then 20 mg daily. - Follow-up in 4 weeks to reassess with repeat PHQ9   Orders Placed This Encounter  Procedures  . Vitamin B12    Standing Status:   Future    Standing Expiration Date:   10/03/2017  . Uric Acid    Standing Status:   Future    Standing Expiration Date:  10/03/2017  . HgB A1c    Meds ordered this encounter  Medications  . doxycycline (VIBRA-TABS) 100 MG tablet    Sig: Take 1 tablet (100 mg total) by mouth 2 (two) times daily.    Dispense:  20 tablet    Refill:  0  . FLUoxetine (PROZAC) 10 MG capsule    Sig: Take 1 tablet daily for 7 days. Then increase to 2 tablets daily after that.    Dispense:  60 capsule    Refill:  3     Elberta Leatherwood, MD,MS,  PGY3 10/03/2016  11:58 AM

## 2016-10-10 ENCOUNTER — Ambulatory Visit (INDEPENDENT_AMBULATORY_CARE_PROVIDER_SITE_OTHER): Payer: Medicare Other | Admitting: Family Medicine

## 2016-10-10 ENCOUNTER — Encounter: Payer: Self-pay | Admitting: Family Medicine

## 2016-10-10 VITALS — BP 146/80 | HR 68 | Temp 98.0°F | Ht 67.0 in | Wt 204.0 lb

## 2016-10-10 DIAGNOSIS — L03116 Cellulitis of left lower limb: Secondary | ICD-10-CM

## 2016-10-10 DIAGNOSIS — I779 Disorder of arteries and arterioles, unspecified: Secondary | ICD-10-CM | POA: Diagnosis not present

## 2016-10-10 DIAGNOSIS — F329 Major depressive disorder, single episode, unspecified: Secondary | ICD-10-CM | POA: Diagnosis not present

## 2016-10-10 DIAGNOSIS — M79672 Pain in left foot: Secondary | ICD-10-CM

## 2016-10-10 DIAGNOSIS — F32A Depression, unspecified: Secondary | ICD-10-CM

## 2016-10-10 MED ORDER — CLINDAMYCIN HCL 300 MG PO CAPS: 300.0000 mg | ORAL_CAPSULE | Freq: Four times a day (QID) | ORAL | 0 refills | Status: DC

## 2016-10-10 NOTE — Progress Notes (Signed)
Date of Visit: 10/10/2016   HPI:  [name] presents for follow up of his left leg cellulitis.  Seen here at [facility] on 10/9 by PCP Dr. Alease Frame. Started on doxycycline for cellulitis x10 days. He reports that since that time, leg redness and pain have not improved at all. Reports having pain and redness in his leg "since the beginning of the year" when he had a vascular surgery procedure done. No fevers. No chest pain or shortness of breath.   Also at visit 10/9 noted depression with PHQ-9 score of 19. Started prozac 10mg x7 days, then up to 20mg  daily. Patient reports compliance with prozac. Mood is okay. No thoughts of harming self or others. PHQ-0 score today 16.  ROS: See HPI.  [place]: history of cellulitis, depression, hypertension, PAD, iron def anemia, type 2 diabetes, hyperlipidemia, tobacco abuse, GERD, diabetic neuropathy, obesity, osteoarthritis  PHYSICAL EXAM: BP (!) 146/80   Pulse 68   Temp 98 F (36.7 C) (Oral)   Ht 5\' 7"  (1.702 m)   Wt 204 lb (92.5 kg)   SpO2 98%   BMI 31.95 kg/m  Gen: NAD, pleasant, cooeprative HEENT: normocephalic, atraumatic. Moist mucous membranes  Heart: regular rate and rhythm, no murmur Lungs: clear to auscultation bilaterally, normal work of breathing  Neuro: alert, grossly nonfocal, speech normal Ext: LLE erythematous when compared to R. Not particularly warm but is quite tender to palpation over much of the left foot, with swelling in place. Able to move toes normally. Limps with gait, wearing post-oropharynx shoe. No palpable cords. + calf tenderness, though most of pain is limited to left foot itself. No pain with dorsiflexion & plantarflexion of left great toe.     ASSESSMENT/PLAN:  Depression Briefly discussed during today's visit. Compliant with prozac. No SI/HI. PHQ-9 mildly improved. Follow up with PCP.   Cellulitis of leg, left 65 yo M with complex PMHx presenting with LLE redness, swelling, and pain. Etiology not  clear but exam with concerning erythema and swelling of the left foot and leg. Differential diagnosis includes: - Cellulitis - though no improvement on doxycycline. Will switch to PO clindamycin for now (better strep coverage). I am not convinced this is entirely just cellulitis (exam not typical for that as not particularly warm, no improvement on doxy, and patient is at risk for other causes) - DVT - with unilaterality, but fortunately no chest pain or shortness of breath to suggest PE. Will obtain LLE doppler to eval for DVT. This had to be scheduled for tomorrow morning due to there being no openings in the schedule today, and patient preferred to wait til the AM rather than go to ED for doppler - occult fracture/osteo/other musculoskeletal pathology - though no history of fevers, no known injury. Will obtain xrays - arterial pathology - more likely given history of arterial surgical procedures on this leg. No recent follow up with vascular surgery (pt left visit in August without being seen). Called and spoke with vascular surgery office, they will attempt to work patient in this week for a visit. - gout - less likely as it is affecting entire foot/leg. Uric acid previously ordered by PCP but not done. Patient prefers to hold off on blood draw for now.  Summary of plan: - LE venous doppler - change doxy to clindamycin - Xray left foot - follow up with vascular surgery this week - follow up with either myself or PCP Dr. Alease Frame in 1 week.  FOLLOW UP: Follow up in 1  week at [facility] for above issues Follow up with vascular surgery this week.  [place]. Ardelia Mems, Ruth

## 2016-10-10 NOTE — Patient Instructions (Addendum)
Getting ultrasound of your leg to be sure there's not a blood clot Also go get xray of your foot done Switch to clindamycin to cover for infection (stop doxycycline)  Follow up with vascular surgery this week. They will call you with an appointment.  Follow up with me or Dr. Alease Frame in 1 week, sooner if things are worsening.  Be well, Dr. Ardelia Mems

## 2016-10-11 ENCOUNTER — Ambulatory Visit (HOSPITAL_COMMUNITY)
Admission: RE | Admit: 2016-10-11 | Discharge: 2016-10-11 | Disposition: A | Discharge status: Home / Self Care | Payer: Medicare Other | Source: Ambulatory Visit | Attending: Family Medicine | Admitting: Family Medicine

## 2016-10-11 ENCOUNTER — Encounter: Payer: Self-pay | Admitting: Family

## 2016-10-11 DIAGNOSIS — M7732 Calcaneal spur, left foot: Secondary | ICD-10-CM | POA: Insufficient documentation

## 2016-10-11 DIAGNOSIS — M19072 Primary osteoarthritis, left ankle and foot: Secondary | ICD-10-CM | POA: Insufficient documentation

## 2016-10-11 DIAGNOSIS — M79672 Pain in left foot: Secondary | ICD-10-CM

## 2016-10-11 NOTE — Progress Notes (Signed)
VASCULAR LAB PRELIMINARY  PRELIMINARY  PRELIMINARY  PRELIMINAR  Left lower extremity venous duplex completed.    Preliminary report:  Left:  No evidence of DVT, superficial thrombosis, or Baker's cyst.  Corion Sherrod, RVS 10/11/2016, 9:52 AM

## 2016-10-12 ENCOUNTER — Ambulatory Visit (INDEPENDENT_AMBULATORY_CARE_PROVIDER_SITE_OTHER): Payer: Medicare Other | Admitting: Family

## 2016-10-12 ENCOUNTER — Encounter: Payer: Self-pay | Admitting: Family

## 2016-10-12 ENCOUNTER — Ambulatory Visit (HOSPITAL_COMMUNITY)
Admission: RE | Admit: 2016-10-12 | Discharge: 2016-10-12 | Disposition: A | Discharge status: Home / Self Care | Payer: Medicare Other | Source: Ambulatory Visit | Attending: Family | Admitting: Family

## 2016-10-12 VITALS — BP 118/73 | HR 103 | Temp 98.2°F | Resp 20 | Ht 67.0 in | Wt 204.0 lb

## 2016-10-12 DIAGNOSIS — M79605 Pain in left leg: Secondary | ICD-10-CM | POA: Insufficient documentation

## 2016-10-12 DIAGNOSIS — I70422 Atherosclerosis of autologous vein bypass graft(s) of the extremities with rest pain, left leg: Secondary | ICD-10-CM

## 2016-10-12 DIAGNOSIS — I779 Disorder of arteries and arterioles, unspecified: Secondary | ICD-10-CM | POA: Diagnosis not present

## 2016-10-12 DIAGNOSIS — T82898D Other specified complication of vascular prosthetic devices, implants and grafts, subsequent encounter: Secondary | ICD-10-CM

## 2016-10-12 DIAGNOSIS — Z95828 Presence of other vascular implants and grafts: Secondary | ICD-10-CM | POA: Diagnosis not present

## 2016-10-12 DIAGNOSIS — F17290 Nicotine dependence, other tobacco product, uncomplicated: Secondary | ICD-10-CM

## 2016-10-12 NOTE — Assessment & Plan Note (Signed)
65 yo M with complex PMHx presenting with LLE redness, swelling, and pain. Etiology not clear but exam with concerning erythema and swelling of the left foot and leg. Differential diagnosis includes: - Cellulitis - though no improvement on doxycycline. Will switch to PO clindamycin for now (better strep coverage). I am not convinced this is entirely just cellulitis (exam not typical for that as not particularly warm, no improvement on doxy, and patient is at risk for other causes) - DVT - with unilaterality, but fortunately no chest pain or shortness of breath to suggest PE. Will obtain LLE doppler to eval for DVT. This had to be scheduled for tomorrow morning due to there being no openings in the schedule today, and patient preferred to wait til the AM rather than go to ED for doppler - occult fracture/osteo/other musculoskeletal pathology - though no history of fevers, no known injury. Will obtain xrays - arterial pathology - more likely given history of arterial surgical procedures on this leg. No recent follow up with vascular surgery (pt left visit in August without being seen). Called and spoke with vascular surgery office, they will attempt to work patient in this week for a visit. - gout - less likely as it is affecting entire foot/leg. Uric acid previously ordered by PCP but not done. Patient prefers to hold off on blood draw for now.  Summary of plan: - LE venous doppler - change doxy to clindamycin - Xray left foot - follow up with vascular surgery this week - follow up with either myself or PCP Dr. Alease Frame in 1 week.

## 2016-10-12 NOTE — Progress Notes (Signed)
[facility] [place]   CC: History of peripheral artery occlusive disease, worsening pain/swelling left lower leg  History of Present Illness [name] is a 65 y.o. male patient of Dr. Kellie Simmering who is s/p: Left CFA to below knee popliteal bypass graft with left saphenous vein on 01/06/16 by Dr. Kellie Simmering, assisted by Dr. Donnetta Hutching.  He was seen 10/10/16 [facility]; also seen on 10/9 by PCP Dr. Alease Frame. Started on doxycycline for cellulitis x10 days.  Also noted depression with PHQ-9 score of 19 Started prozac 10mg x7 days, then up to 20mg  daily  He has a history of cellulitis, depression, hypertension, PAD, iron def anemia, type 2 diabetes, hyperlipidemia, tobacco abuse, GERD, diabetic neuropathy, obesity, osteoarthritis  At his 02/09/16 visit, his left leg had moderate swelling and subsequent separation of several areas of his left leg medial incision and left groin incision due to swelling; it seemed that he was not elevating his legs adequately to promote venous return. He was advised to elevate his leg as much as possible when not walking, his foot above his heart with his back flat. He was also advised to elevate the foot of his bed to let gravity help the swelling at night.  His left groin incision is well healed, his left lower leg incision is well healed with prior separated area on lower leg incision completely granulated in.  The swelling in his left leg has returned. He returns today at the request of his PCP to evaluate left foot and calf pain that started suddenly about 4 weeks ago, he then said 4 months ago when Dr. Scot Dock examined him. Pt states he has had pain and swelling in the left foot and lower leg since the January 2017 surgery. He was supposed to be scheduled for an arteriogram after his July 2017 visit with me, but states he did not want that, does not articulate reasons for this.  He was scheduled to see Dr. Trula Slade on 08/08/16 and left  before being seen.   Pt states his left lower leg feels no different since surgery: burning sensation posterior thigh to toes.  He states his right posterior thigh and calf hurt after taking a couple of steps.  He denies non healing wounds. His surgical incisions have healed.  Pt Diabetic: Yes, 6.7 A1C on 06/24/16 (review of records) Pt smoker: 2 cigars/day  Pt meds include: Statin :Yes Betablocker: Yes ASA: Yes Other anticoagulants/antiplatelets: Plavix, Pletal    Past Medical History:  Diagnosis Date  . Anemia   . Arthritis    OA  . Cervical radiculopathy    Dr. Vertell Limber neurosurgery  . Chronic lower back pain   . Diabetes mellitus    takes Metformin daily  . GERD (gastroesophageal reflux disease)    takes Protonix daily  . History of blood transfusion    "related to low HgB" ((09/10/2015  . Hyperlipidemia    takes Vytorin daily  . Hypertension    takes Benazepril and Bystolic daily  . PAD (peripheral artery disease) ([name])   . Pneumonia   . Shortness of breath dyspnea    with exertion  . Tobacco user   . Toe fracture, right 05/09/2011    Social History Social History  Substance Use Topics  . Smoking status: Current Some Day Smoker    Years: 24.00    Types: Cigars  . Smokeless tobacco: Never Used     Comment: smokes 1-2 cigars per day   . Alcohol use 7.8  oz/week    7 Cans of beer, 6 Shots of liquor per week    Family History Family History  Problem Relation Age of Onset  . Cancer Mother     colon Cancer  . Heart disease Father   . Hypertension Father     Past Surgical History:  Procedure Laterality Date  . ANTERIOR CERVICAL DECOMP/DISCECTOMY FUSION  03/08/12   C6-7  . ANTERIOR CERVICAL DECOMP/DISCECTOMY FUSION  03/08/2012   Procedure: ANTERIOR CERVICAL DECOMPRESSION/DISCECTOMY FUSION 1 LEVEL/HARDWARE REMOVAL;  Surgeon: Erline Levine, MD;  Location: [place] [facility];  Service: Neurosurgery;  Laterality: N/A;  revison of C5-7 anterior cervical decompression  with fusion with Cervical Five-Thoracic One anterior cervical decompression with fusion with interbody prothesis plating and bonegraft  . BACK SURGERY  1996  . COLONOSCOPY W/ BIOPSIES AND POLYPECTOMY  08/17/2012   f/u 5 years, 4 polyps, no high grade dysplasia or malignancy, tubular adenoma, hyperplastic polyops  . ENTEROSCOPY N/A 12/11/2015   Procedure: ENTEROSCOPY;  Surgeon: Carol Ada, MD;  Location: [facility] END[facility]COPY;  Service: Endoscopy;  Laterality: N/A;  . ESOPHAGOGASTRODUODENOSCOPY  08/17/2012   normal esophagus and GEJ, diffuse gastritis with erythema- no malignancy, reactive gastropathy  with focal intestinal metaplasia  . FEMORAL-POPLITEAL BYPASS GRAFT Left 01/06/2016   Procedure: Left  COMMON FEMORAL-BELOW KNEE POPLITEAL ARTERY Bypass using non-reversed translocated saphenous vein graft from left leg;  Surgeon: Mal Misty, MD;  Location: [place];  Service: Vascular;  Laterality: Left;  . GIVENS CAPSULE STUDY N/A 11/24/2015   Procedure: GIVENS CAPSULE STUDY;  Surgeon: Juanita Craver, MD;  Location: [place];  Service: Endoscopy;  Laterality: N/A;  . INGUINAL HERNIA REPAIR  1990's   right  . INTRAOPERATIVE ARTERIOGRAM Left 01/06/2016   Procedure: INTRA OPERATIVE ARTERIOGRAM LEFT LOWER LEG;  Surgeon: Mal Misty, MD;  Location: [place];  Service: Vascular;  Laterality: Left;  . LOWER EXTREMITY ANGIOGRAM Bilateral 07/30/2015   Procedure: Lower Extremity Angiogram;  Surgeon: Conrad Califon, MD;  Location: [place] CV LAB;  Service: Cardiovascular;  Laterality: Bilateral;  . [name]   "lower"  . PERIPHERAL VASCULAR CATHETERIZATION N/A 07/30/2015   Procedure: Abdominal Aortogram;  Surgeon: Conrad Callaway, MD;  Location: [place] CV LAB;  Service: Cardiovascular;  Laterality: N/A;  . VASCULAR SURGERY  ~ 2007   Stent SFA   . VEIN HARVEST Left 01/06/2016   Procedure: LEFT GREATER SAPPHENOUS VEIN HARVEST;  Surgeon: Mal Misty, MD;  Location: [place];  Service: Vascular;   Laterality: Left;    Allergies  Allergen Reactions  . Glipizide Other (See Comments)    Severe hypoglycemia to 40s.     Current Outpatient Prescriptions  Medication Sig Dispense Refill  . acetaminophen (TYLENOL) 500 MG tablet Take 1,000 mg by mouth every 6 (six) hours as needed for moderate pain.    Marland Kitchen aspirin EC 81 MG tablet Take 162 mg by mouth daily.    . benazepril (LOTENSIN) 5 MG tablet Take 1 tablet (5 mg total) by mouth daily. 90 tablet 3  . cilostazol (PLETAL) 100 MG tablet TAKE 1 TABLET TWICE A DAY 60 tablet 5  . clindamycin (CLEOCIN) 300 MG capsule Take 1 capsule (300 mg total) by mouth 4 (four) times daily. 28 capsule 0  . clopidogrel (PLAVIX) 75 MG tablet TAKE 1 TABLET EVERY DAY 90 tablet 3  . cyclobenzaprine (FLEXERIL) 10 MG tablet TAKE 1 TABLET (10 MG TOTAL) BY MOUTH 3 (THREE) TIMES DAILY AS NEEDED FOR MUSCLE SPASMS.  45 tablet 0  . docusate sodium (COLACE) 100 MG capsule Take 1 capsule (100 mg total) by mouth 2 (two) times daily. 60 capsule 1  . doxycycline (VIBRA-TABS) 100 MG tablet Take 1 tablet (100 mg total) by mouth 2 (two) times daily. 20 tablet 0  . ezetimibe (ZETIA) 10 MG tablet Take 1 tablet (10 mg total) by mouth daily. 90 tablet 3  . ferrous sulfate (FERROUSUL) 325 (65 FE) MG tablet Take 1 tablet (325 mg total) by mouth daily with breakfast. 30 tablet 1  . FLUoxetine (PROZAC) 10 MG capsule Take 1 tablet daily for 7 days. Then increase to 2 tablets daily after that. 60 capsule 3  . gabapentin (NEURONTIN) 800 MG tablet TAKE 1.5 TABLETS (1,200 MG TOTAL) BY MOUTH 3 (THREE) TIMES DAILY. 135 tablet 5  . meloxicam (MOBIC) 15 MG tablet Take 1 tablet (15 mg total) by mouth daily. 14 tablet 0  . metFORMIN (GLUCOPHAGE) 500 MG tablet Take 500 mg by mouth daily.    . Nebivolol HCl (BYSTOLIC) 20 MG TABS Take 1 tablet (20 mg total) by mouth daily. 30 tablet 11  . pantoprazole (PROTONIX) 40 MG tablet Take 1 tablet (40 mg total) by mouth daily. 30 tablet 11  . simvastatin (ZOCOR)  40 MG tablet Take 1 tablet (40 mg total) by mouth at bedtime. 90 tablet 3  . traMADol (ULTRAM) 50 MG tablet Take 1 tablet (50 mg total) by mouth every 12 (twelve) hours as needed for moderate pain. 60 tablet 1   No current facility-administered medications for this visit.     ROS: See HPI for pertinent positives and negatives.   Physical Examination  Vitals:   10/12/16 1030  BP: 118/73  Pulse: (!) 103  Resp: 20  Temp: 98.2 F (36.8 C)  TempSrc: Oral  SpO2: 99%  Weight: 204 lb (92.5 kg)  Height: 5\' 7"  (1.702 m)   Body mass index is 31.95 kg/m.  General: A&O x 3, WDWN, obese male. Gait: antalgic, using a cane Eyes: PERRLA. Pulmonary: Respirations are non labored, CTAB, distant breath sounds, without wheezes , rales or rhonchi. Cardiac: regular rhythm, tachycardic at 103, no detected murmur.         Carotid Bruits Right Left   Negative Negative  Aorta is not palpable. Radial pulses: 2+ palpable and =                           VASCULAR EXAM: Extremities with ischemic changes: left foot and lower leg with 1+ non pitting edema, moderate erythema of all left toes, dependent rubor of left foot and lower leg, very tender to touch. Without Gangrene; without open wounds.  LE Pulses Right Left       FEMORAL  1+palpable (obese) 1+palpable (obese)       POPLITEAL  not palpable Not palpable       POSTERIOR TIBIAL  monophasic by Doppler  no Doppler signal       DORSALIS PEDIS      ANTERIOR TIBIAL No Doppler signal thready Doppler signal       PERONEAL Monophasic Doppler signal No Doppler signal   Abdomen: soft, NT, no palpable masses. Skin: no rashes, no ulcers. See Extremities. Musculoskeletal: no muscle wasting or atrophy.         Neurologic: A&O X 3; Appropriate Affect ; SENSATION: normal; MOTOR FUNCTION:  moving all  extremities equally, motor strength 5/5 throughout. Speech is fluent/normal. CN 2-12 intact.   ASSESSMENT: [name] is a 65 y.o. male who is s/p: Left CFA to below knee popliteal bypass graft with left saphenous vein on 01/06/16 by Dr. Kellie Simmering, assisted by Dr. Donnetta Hutching. In the last 4 weeks to 4 months his left foot and lower leg have become more painful and red, mild swelling. He was advised by Dr. Kellie Simmering to have an arteriogram at his July 04, 2016 visit.  He was scheduled to see Dr. Trula Slade on 08/08/16 and pt left before being seen.   His last creatinine result on file was 0.78 on 01/10/16. He has been taking doxycycline for cellulitis of his left foot/calf since his 10/03/16 visit with his PCP.  Dr. Scot Dock spoke with and examined pt. 1 pm left LE arterial duplex today, ABI's cannot be fit in on the non invasive vascular lab schedule today. Pt agrees to return at 12:15 pm today for the test, states he needs to pick up his wife.  He did return.   DATA Left LE arterial duplex today demonstrates  Inability to adequately visualize the mid and distal segments of the bypass graft; most likely due to chronically occluded graft. No color Doppler flow noted in the left lower extremity bypass graft consistent with occlusion.  Findings of today are consistent with the last exam on 07/04/16.   PLAN:  Based on the patient's vascular studies and examination, and after reviewing left LE arterial duplex results from today with Dr. Scot Dock, pt will be scheduled for arteriogram with bilateral run off, possible intervention. Dr. Scot Dock can perform this tomorrow, but pt states he needs to discuss this with his wife, find out when she can take a day off. Pt states our surgery scheduling nurse can call him later today to try to schedule this.  Dr. Scot Dock also recommends scheduling an MRI with contrast of his left foot to evaluate for possible abscess or osteomyelitis.   The patient was counseled re smoking  cessation and given several free resources re smoking cessation.   I discussed in depth with the patient the nature of atherosclerosis, and emphasized the importance of maximal medical management including strict control of blood pressure, blood glucose, and lipid levels, obtaining regular exercise, and cessation of smoking.  The patient is aware that without maximal medical management the underlying atherosclerotic disease process will progress, limiting the benefit of any interventions.  The patient was given information about PAD including signs, symptoms, treatment, what symptoms should prompt the patient to seek immediate medical care, and risk reduction measures to take.  Clemon Chambers, RN, MSN, FNP-C [facility] Phone: [phone]  Clinic MD: Scot Dock  10/12/16 10:49 AM        -  No evidence of deep vein or superficial thrombosis involving the   left lower extremity and right common femoral vein. - No evidence of Baker&'s cyst on the left.

## 2016-10-12 NOTE — Assessment & Plan Note (Signed)
Briefly discussed during today's visit. Compliant with prozac. No SI/HI. PHQ-9 mildly improved. Follow up with PCP.

## 2016-10-12 NOTE — Patient Instructions (Addendum)
Peripheral Vascular Disease Peripheral vascular disease (PVD) is a disease of the blood vessels that are not part of your heart and brain. A simple term for PVD is poor circulation. In most cases, PVD narrows the blood vessels that carry blood from your heart to the rest of your body. This can result in a decreased supply of blood to your arms, legs, and internal organs, like your stomach or kidneys. However, it most often affects a person's lower legs and feet. There are two types of PVD.  Organic PVD. This is the more common type. It is caused by damage to the structure of blood vessels.  Functional PVD. This is caused by conditions that make blood vessels contract and tighten (spasm). Without treatment, PVD tends to get worse over time. PVD can also lead to acute ischemic limb. This is when an arm or limb suddenly has trouble getting enough blood. This is a medical emergency. CAUSES Each type of PVD has many different causes. The most common cause of PVD is buildup of a fatty material (plaque) inside of your arteries (atherosclerosis). Small amounts of plaque can break off from the walls of the blood vessels and become lodged in a smaller artery. This blocks blood flow and can cause acute ischemic limb. Other common causes of PVD include:  Blood clots that form inside of blood vessels.  Injuries to blood vessels.  Diseases that cause inflammation of blood vessels or cause blood vessel spasms.  Health behaviors and health history that increase your risk of developing PVD. RISK FACTORS  You may have a greater risk of PVD if you:  Have a family history of PVD.  Have certain medical conditions, including:  High cholesterol.  Diabetes.  High blood pressure (hypertension).  Coronary heart disease.  Past problems with blood clots.  Past injury, such as burns or a broken bone. These may have damaged blood vessels in your limbs.  Buerger disease. This is caused by inflamed blood  vessels in your hands and feet.  Some forms of arthritis.  Rare birth defects that affect the arteries in your legs.  Use tobacco.  Do not get enough exercise.  Are obese.  Are age 50 or older. SIGNS AND SYMPTOMS  PVD may cause many different symptoms. Your symptoms depend on what part of your body is not getting enough blood. Some common signs and symptoms include:  Cramps in your lower legs. This may be a symptom of poor leg circulation (claudication).  Pain and weakness in your legs while you are physically active that goes away when you rest (intermittent claudication).  Leg pain when at rest.  Leg numbness, tingling, or weakness.  Coldness in a leg or foot, especially when compared with the other leg.  Skin or hair changes. These can include:  Hair loss.  Shiny skin.  Pale or bluish skin.  Thick toenails.  Inability to get or maintain an erection (erectile dysfunction). People with PVD are more prone to developing ulcers and sores on their toes, feet, or legs. These may take longer than normal to heal. DIAGNOSIS Your health care provider may diagnose PVD from your signs and symptoms. The health care provider will also do a physical exam. You may have tests to find out what is causing your PVD and determine its severity. Tests may include:  Blood pressure recordings from your arms and legs and measurements of the strength of your pulses (pulse volume recordings).  Imaging studies using sound waves to take pictures of   the blood flow through your blood vessels (Doppler ultrasound).  Injecting a dye into your blood vessels before having imaging studies using:  X-rays (angiogram or arteriogram).  Computer-generated X-rays (CT angiogram).  A powerful electromagnetic field and a computer (magnetic resonance angiogram or MRA). TREATMENT Treatment for PVD depends on the cause of your condition and the severity of your symptoms. It also depends on your age. Underlying  causes need to be treated and controlled. These include long-lasting (chronic) conditions, such as diabetes, high cholesterol, and high blood pressure. You may need to first try making lifestyle changes and taking medicines. Surgery may be needed if these do not work. Lifestyle changes may include:  Quitting smoking.  Exercising regularly.  Following a low-fat, low-cholesterol diet. Medicines may include:  Blood thinners to prevent blood clots.  Medicines to improve blood flow.  Medicines to improve your blood cholesterol levels. Surgical procedures may include:  A procedure that uses an inflated balloon to open a blocked artery and improve blood flow (angioplasty).  A procedure to put in a tube (stent) to keep a blocked artery open (stent implant).  Surgery to reroute blood flow around a blocked artery (peripheral bypass surgery).  Surgery to remove dead tissue from an infected wound on the affected limb.  Amputation. This is surgical removal of the affected limb. This may be necessary in cases of acute ischemic limb that are not improved through medical or surgical treatments. HOME CARE INSTRUCTIONS  Take medicines only as directed by your health care provider.  Do not use any tobacco products, including cigarettes, chewing tobacco, or electronic cigarettes. If you need help quitting, ask your health care provider.  Lose weight if you are overweight, and maintain a healthy weight as directed by your health care provider.  Eat a diet that is low in fat and cholesterol. If you need help, ask your health care provider.  Exercise regularly. Ask your health care provider to suggest some good activities for you.  Use compression stockings or other mechanical devices as directed by your health care provider.  Take good care of your feet.  Wear comfortable shoes that fit well.  Check your feet often for any cuts or sores. SEEK MEDICAL CARE IF:  You have cramps in your legs  while walking.  You have leg pain when you are at rest.  You have coldness in a leg or foot.  Your skin changes.  You have erectile dysfunction.  You have cuts or sores on your feet that are not healing. SEEK IMMEDIATE MEDICAL CARE IF:  Your arm or leg turns cold and blue.  Your arms or legs become red, warm, swollen, painful, or numb.  You have chest pain or trouble breathing.  You suddenly have weakness in your face, arm, or leg.  You become very confused or lose the ability to speak.  You suddenly have a very bad headache or lose your vision.   This information is not intended to replace advice given to you by your health care provider. Make sure you discuss any questions you have with your health care provider.   Document Released: 01/19/2005 Document Revised: 01/02/2015 Document Reviewed: 05/22/2014 Elsevier Interactive Patient Education 2016 [employer] Inc[employer]     Steps to Quit Smoking  Smoking tobacco can be harmful to your health and can affect almost every organ in your body. Smoking puts you, and those around you, at risk for developing many serious chronic diseases. Quitting smoking is difficult, but it is one of   the best things that you can do for your health. It is never too late to quit. WHAT ARE THE BENEFITS OF QUITTING SMOKING? When you quit smoking, you lower your risk of developing serious diseases and conditions, such as:  Lung cancer or lung disease, such as COPD.  Heart disease.  Stroke.  Heart attack.  Infertility.  Osteoporosis and bone fractures. Additionally, symptoms such as coughing, wheezing, and shortness of breath may get better when you quit. You may also find that you get sick less often because your body is stronger at fighting off colds and infections. If you are pregnant, quitting smoking can help to reduce your chances of having a baby of low birth weight. HOW DO I GET READY TO QUIT? When you decide to quit smoking, create a plan to  make sure that you are successful. Before you quit:  Pick a date to quit. Set a date within the next two weeks to give you time to prepare.  Write down the reasons why you are quitting. Keep this list in places where you will see it often, such as on your bathroom mirror or in your car or wallet.  Identify the people, places, things, and activities that make you want to smoke (triggers) and avoid them. Make sure to take these actions:  Throw away all cigarettes at home, at work, and in your car.  Throw away smoking accessories, such as ashtrays and lighters.  Clean your car and make sure to empty the ashtray.  Clean your home, including curtains and carpets.  Tell your family, friends, and coworkers that you are quitting. Support from your loved ones can make quitting easier.  Talk with your health care provider about your options for quitting smoking.  Find out what treatment options are covered by your health insurance. WHAT STRATEGIES CAN I USE TO QUIT SMOKING?  Talk with your healthcare provider about different strategies to quit smoking. Some strategies include:  Quitting smoking altogether instead of gradually lessening how much you smoke over a period of time. Research shows that quitting "cold turkey" is more successful than gradually quitting.  Attending in-person counseling to help you build problem-solving skills. You are more likely to have success in quitting if you attend several counseling sessions. Even short sessions of 10 minutes can be effective.  Finding resources and support systems that can help you to quit smoking and remain smoke-free after you quit. These resources are most helpful when you use them often. They can include:  Online chats with a counselor.  Telephone quitlines.  Printed self-help materials.  Support groups or group counseling.  Text messaging programs.  Mobile phone applications.  Taking medicines to help you quit smoking. (If you are  pregnant or breastfeeding, talk with your health care provider first.) Some medicines contain nicotine and some do not. Both types of medicines help with cravings, but the medicines that include nicotine help to relieve withdrawal symptoms. Your health care provider may recommend:  Nicotine patches, gum, or lozenges.  Nicotine inhalers or sprays.  Non-nicotine medicine that is taken by mouth. Talk with your health care provider about combining strategies, such as taking medicines while you are also receiving in-person counseling. Using these two strategies together makes you more likely to succeed in quitting than if you used either strategy on its own. If you are pregnant or breastfeeding, talk with your health care provider about finding counseling or other support strategies to quit smoking. Do not take medicine to help you   quit smoking unless told to do so by your health care provider. WHAT THINGS CAN I DO TO MAKE IT EASIER TO QUIT? Quitting smoking might feel overwhelming at first, but there is a lot that you can do to make it easier. Take these important actions:  Reach out to your family and friends and ask that they support and encourage you during this time. Call telephone quitlines, reach out to support groups, or work with a counselor for support.  Ask people who smoke to avoid smoking around you.  Avoid places that trigger you to smoke, such as bars, parties, or smoke-break areas at work.  Spend time around people who do not smoke.  Lessen stress in your life, because stress can be a smoking trigger for some people. To lessen stress, try:  Exercising regularly.  Deep-breathing exercises.  Yoga.  Meditating.  Performing a body scan. This involves closing your eyes, scanning your body from head to toe, and noticing which parts of your body are particularly tense. Purposefully relax the muscles in those areas.  Download or purchase mobile phone or tablet apps (applications)  that can help you stick to your quit plan by providing reminders, tips, and encouragement. There are many free apps, such as QuitGuide from the CDC (Centers for Disease Control and Prevention). You can find other support for quitting smoking (smoking cessation) through smokefree.gov and other websites. HOW WILL I FEEL WHEN I QUIT SMOKING? Within the first 24 hours of quitting smoking, you may start to feel some withdrawal symptoms. These symptoms are usually most noticeable 2-3 days after quitting, but they usually do not last beyond 2-3 weeks. Changes or symptoms that you might experience include:  Mood swings.  Restlessness, anxiety, or irritation.  Difficulty concentrating.  Dizziness.  Strong cravings for sugary foods in addition to nicotine.  Mild weight gain.  Constipation.  Nausea.  Coughing or a sore throat.  Changes in how your medicines work in your body.  A depressed mood.  Difficulty sleeping (insomnia). After the first 2-3 weeks of quitting, you may start to notice more positive results, such as:  Improved sense of smell and taste.  Decreased coughing and sore throat.  Slower heart rate.  Lower blood pressure.  Clearer skin.  The ability to breathe more easily.  Fewer sick days. Quitting smoking is very challenging for most people. Do not get discouraged if you are not successful the first time. Some people need to make many attempts to quit before they achieve long-term success. Do your best to stick to your quit plan, and talk with your health care provider if you have any questions or concerns.   This information is not intended to replace advice given to you by your health care provider. Make sure you discuss any questions you have with your health care provider.   Document Released: 12/06/2001 Document Revised: 04/28/2015 Document Reviewed: 04/28/2015 Elsevier Interactive Patient Education 2016 Elsevier Inc.  

## 2016-10-14 ENCOUNTER — Telehealth: Payer: Self-pay

## 2016-10-14 ENCOUNTER — Other Ambulatory Visit: Payer: Self-pay

## 2016-10-14 DIAGNOSIS — Z95828 Presence of other vascular implants and grafts: Secondary | ICD-10-CM

## 2016-10-14 DIAGNOSIS — I998 Other disorder of circulatory system: Secondary | ICD-10-CM

## 2016-10-14 DIAGNOSIS — I739 Peripheral vascular disease, unspecified: Secondary | ICD-10-CM

## 2016-10-14 DIAGNOSIS — M79606 Pain in leg, unspecified: Secondary | ICD-10-CM

## 2016-10-14 MED ORDER — OXYCODONE-ACETAMINOPHEN 5-325 MG PO TABS: 1.0000 | ORAL_TABLET | Freq: Four times a day (QID) | ORAL | 0 refills | Status: DC | PRN

## 2016-10-14 NOTE — Telephone Encounter (Signed)
Rec'd phone call from pt.  Requested pain medication for pain in left leg.  Recently evaluated per NP in our office on 10/18.  Per exam on 10/18, has ischemic changes of Left LE, due to chronically occluded graft.   Reported he takes Tylenol q 6 hrs. for the pain.  Questioned about the Tramadol prescription from his PCP; stated he doesn't have any Tramadol left.  Discussed pt's request for pain medication with nurse practitioner.  Advised to give Oxycodone/ Acetaminophen 5/325 mg, 1 tab. q 6hr/ prn; # 15; no refills.  Also advised that the pt. will need to get the angiogram done.  Phone call to pt.  Left voice message that the pain prescription has been authorized, and requested a call back to nurse to discuss scheduling the angiogram.

## 2016-10-24 ENCOUNTER — Ambulatory Visit: Payer: Medicare Other | Admitting: Family Medicine

## 2016-10-24 ENCOUNTER — Ambulatory Visit (HOSPITAL_COMMUNITY)
Admission: RE | Admit: 2016-10-24 | Discharge: 2016-10-24 | Disposition: A | Discharge status: Home / Self Care | Payer: Medicare Other | Source: Ambulatory Visit | Attending: Vascular Surgery | Admitting: Vascular Surgery

## 2016-10-24 ENCOUNTER — Encounter (HOSPITAL_COMMUNITY)
Admission: RE | Disposition: A | Discharge status: Home / Self Care | Payer: Self-pay | Source: Ambulatory Visit | Attending: Vascular Surgery

## 2016-10-24 DIAGNOSIS — I708 Atherosclerosis of other arteries: Secondary | ICD-10-CM | POA: Diagnosis not present

## 2016-10-24 DIAGNOSIS — F329 Major depressive disorder, single episode, unspecified: Secondary | ICD-10-CM | POA: Diagnosis not present

## 2016-10-24 DIAGNOSIS — T82898A Other specified complication of vascular prosthetic devices, implants and grafts, initial encounter: Secondary | ICD-10-CM | POA: Insufficient documentation

## 2016-10-24 DIAGNOSIS — Z7982 Long term (current) use of aspirin: Secondary | ICD-10-CM | POA: Insufficient documentation

## 2016-10-24 DIAGNOSIS — K219 Gastro-esophageal reflux disease without esophagitis: Secondary | ICD-10-CM | POA: Diagnosis not present

## 2016-10-24 DIAGNOSIS — E785 Hyperlipidemia, unspecified: Secondary | ICD-10-CM | POA: Insufficient documentation

## 2016-10-24 DIAGNOSIS — E1140 Type 2 diabetes mellitus with diabetic neuropathy, unspecified: Secondary | ICD-10-CM | POA: Diagnosis not present

## 2016-10-24 DIAGNOSIS — D509 Iron deficiency anemia, unspecified: Secondary | ICD-10-CM | POA: Insufficient documentation

## 2016-10-24 DIAGNOSIS — Z79899 Other long term (current) drug therapy: Secondary | ICD-10-CM | POA: Diagnosis not present

## 2016-10-24 DIAGNOSIS — F17290 Nicotine dependence, other tobacco product, uncomplicated: Secondary | ICD-10-CM | POA: Insufficient documentation

## 2016-10-24 DIAGNOSIS — E669 Obesity, unspecified: Secondary | ICD-10-CM | POA: Diagnosis not present

## 2016-10-24 DIAGNOSIS — Z6833 Body mass index (BMI) 33.0-33.9, adult: Secondary | ICD-10-CM | POA: Insufficient documentation

## 2016-10-24 DIAGNOSIS — I70222 Atherosclerosis of native arteries of extremities with rest pain, left leg: Secondary | ICD-10-CM | POA: Diagnosis not present

## 2016-10-24 DIAGNOSIS — Z7984 Long term (current) use of oral hypoglycemic drugs: Secondary | ICD-10-CM | POA: Insufficient documentation

## 2016-10-24 DIAGNOSIS — I10 Essential (primary) hypertension: Secondary | ICD-10-CM | POA: Diagnosis not present

## 2016-10-24 DIAGNOSIS — Y832 Surgical operation with anastomosis, bypass or graft as the cause of abnormal reaction of the patient, or of later complication, without mention of misadventure at the time of the procedure: Secondary | ICD-10-CM | POA: Insufficient documentation

## 2016-10-24 DIAGNOSIS — E1151 Type 2 diabetes mellitus with diabetic peripheral angiopathy without gangrene: Secondary | ICD-10-CM | POA: Diagnosis not present

## 2016-10-24 DIAGNOSIS — Z7902 Long term (current) use of antithrombotics/antiplatelets: Secondary | ICD-10-CM | POA: Diagnosis not present

## 2016-10-24 HISTORY — PX: PERIPHERAL VASCULAR CATHETERIZATION: SHX172C

## 2016-10-24 HISTORY — PX: IR GENERIC HISTORICAL: IMG1180011

## 2016-10-24 LAB — GLUCOSE, CAPILLARY: Glucose-Capillary: 111 mg/dL — ABNORMAL HIGH (ref 65–99)

## 2016-10-24 LAB — POCT I-STAT, CHEM 8
BUN: 8 mg/dL (ref 6–20)
Calcium, Ion: 1.14 mmol/L — ABNORMAL LOW (ref 1.15–1.40)
Chloride: 101 mmol/L (ref 101–111)
Creatinine, Ser: 0.9 mg/dL (ref 0.61–1.24)
Glucose, Bld: 121 mg/dL — ABNORMAL HIGH (ref 65–99)
HCT: 37 % — ABNORMAL LOW (ref 39.0–52.0)
Hemoglobin: 12.6 g/dL — ABNORMAL LOW (ref 13.0–17.0)
Potassium: 4.1 mmol/L (ref 3.5–5.1)
Sodium: 137 mmol/L (ref 135–145)
TCO2: 26 mmol/L (ref 0–100)

## 2016-10-24 SURGERY — ABDOMINAL AORTOGRAM
Anesthesia: LOCAL

## 2016-10-24 MED ORDER — ACETAMINOPHEN 500 MG PO TABS
1000.0000 mg | ORAL_TABLET | Freq: Four times a day (QID) | ORAL | Status: DC | PRN
  Administered 2016-10-24: 975 mg via ORAL
  Filled 2016-10-24: qty 2

## 2016-10-24 MED ORDER — FENTANYL CITRATE (PF) 100 MCG/2ML IJ SOLN
INTRAMUSCULAR | Status: DC | PRN
  Administered 2016-10-24: 50 ug via INTRAVENOUS

## 2016-10-24 MED ORDER — HEPARIN (PORCINE) IN NACL 2-0.9 UNIT/ML-% IJ SOLN
INTRAMUSCULAR | Status: AC
  Filled 2016-10-24: qty 1000

## 2016-10-24 MED ORDER — SODIUM CHLORIDE 0.9 % IV SOLN: 1.0000 mL/kg/h | INTRAVENOUS | Status: DC

## 2016-10-24 MED ORDER — MORPHINE SULFATE (PF) 4 MG/ML IV SOLN
INTRAVENOUS | Status: AC
  Filled 2016-10-24: qty 1

## 2016-10-24 MED ORDER — HYDRALAZINE HCL 20 MG/ML IJ SOLN
INTRAMUSCULAR | Status: AC
  Filled 2016-10-24: qty 1

## 2016-10-24 MED ORDER — HYDRALAZINE HCL 20 MG/ML IJ SOLN
10.0000 mg | INTRAMUSCULAR | Status: DC | PRN
  Administered 2016-10-24: 10 mg via INTRAVENOUS

## 2016-10-24 MED ORDER — IODIXANOL 320 MG/ML IV SOLN
INTRAVENOUS | Status: DC | PRN
  Administered 2016-10-24: 160 mL via INTRA_ARTERIAL

## 2016-10-24 MED ORDER — HEPARIN (PORCINE) IN NACL 2-0.9 UNIT/ML-% IJ SOLN
INTRAMUSCULAR | Status: DC | PRN
  Administered 2016-10-24: 1000 mL

## 2016-10-24 MED ORDER — LIDOCAINE HCL (PF) 1 % IJ SOLN
INTRAMUSCULAR | Status: DC | PRN
  Administered 2016-10-24: 12 mL

## 2016-10-24 MED ORDER — MIDAZOLAM HCL 2 MG/2ML IJ SOLN
INTRAMUSCULAR | Status: AC
  Filled 2016-10-24: qty 2

## 2016-10-24 MED ORDER — ACETAMINOPHEN 325 MG PO TABS
ORAL_TABLET | ORAL | Status: AC
  Filled 2016-10-24: qty 3

## 2016-10-24 MED ORDER — LIDOCAINE HCL (PF) 1 % IJ SOLN
INTRAMUSCULAR | Status: AC
  Filled 2016-10-24: qty 30

## 2016-10-24 MED ORDER — SODIUM CHLORIDE 0.9 % IV SOLN
INTRAVENOUS | Status: DC
  Administered 2016-10-24: 09:00:00 via INTRAVENOUS

## 2016-10-24 MED ORDER — OXYCODONE-ACETAMINOPHEN 5-325 MG PO TABS
1.0000 | ORAL_TABLET | ORAL | Status: DC | PRN
  Administered 2016-10-24: 2 via ORAL
  Filled 2016-10-24: qty 2

## 2016-10-24 MED ORDER — MORPHINE SULFATE (PF) 4 MG/ML IV SOLN
3.0000 mg | Freq: Once | INTRAVENOUS | Status: AC
  Administered 2016-10-24: 3 mg via INTRAVENOUS

## 2016-10-24 MED ORDER — HYDRALAZINE HCL 20 MG/ML IJ SOLN: 10.0000 mg | INTRAMUSCULAR | Status: DC | PRN

## 2016-10-24 MED ORDER — FENTANYL CITRATE (PF) 100 MCG/2ML IJ SOLN
INTRAMUSCULAR | Status: AC
  Filled 2016-10-24: qty 2

## 2016-10-24 MED ORDER — OXYCODONE-ACETAMINOPHEN 5-325 MG PO TABS: 1.0000 | ORAL_TABLET | Freq: Four times a day (QID) | ORAL | 0 refills | Status: DC | PRN

## 2016-10-24 MED ORDER — MIDAZOLAM HCL 2 MG/2ML IJ SOLN
INTRAMUSCULAR | Status: DC | PRN
  Administered 2016-10-24: 1 mg via INTRAVENOUS

## 2016-10-24 SURGICAL SUPPLY — 11 items
CATH OMNI FLUSH 5F 65CM (CATHETERS) ×1 IMPLANT
CATH SOFT-VU 4F 65 STRAIGHT (CATHETERS) IMPLANT
CATH SOFT-VU STRAIGHT 4F 65CM (CATHETERS) ×2
GUIDEWIRE ANGLED .035X150CM (WIRE) ×1 IMPLANT
KIT MICROINTRODUCER STIFF 5F (SHEATH) ×1 IMPLANT
KIT PV (KITS) ×2 IMPLANT
SHEATH PINNACLE 5F 10CM (SHEATH) ×1 IMPLANT
SYR MEDRAD MARK V 150ML (SYRINGE) ×2 IMPLANT
TRANSDUCER W/STOPCOCK (MISCELLANEOUS) ×2 IMPLANT
TRAY PV CATH (CUSTOM PROCEDURE TRAY) ×2 IMPLANT
WIRE J 3MM .035X145CM (WIRE) ×1 IMPLANT

## 2016-10-24 NOTE — Interval H&P Note (Signed)
History and Physical Interval Note:  10/24/2016 11:55 AM  [name]  has presented today for surgery, with the diagnosis of PVD with rest pain.  Occluded left bypass graft  The various methods of treatment have been discussed with the patient and family. After consideration of risks, benefits and other options for treatment, the patient has consented to  Procedure(s): Abdominal Aortogram (N/A) Lower Extremity Angiography (N/A) as a surgical intervention .  The patient's history has been reviewed, patient examined, no change in status, stable for surgery.  I have reviewed the patient's chart and labs.  Questions were answered to the patient's satisfaction.     Deitra Mayo

## 2016-10-24 NOTE — Op Note (Signed)
   PATIENT: [name]   MRN: [id number] DOB: November 21, 1951    DATE OF PROCEDURE: 10/24/2016  INDICATIONS: [name] is a 65 y.o. male who was seen in the office by our nurse practitioner with a chronically occluded left femoropopliteal bypass graft. He presents for arteriography. Of note the bypass was done on 01/06/2016. This was a left common femoral artery to below knee popliteal artery bypass using a vein graft.  PROCEDURE:  1. Ultrasound-guided access to the right common femoral artery 2. Aortogram with bilateral iliac arteriogram 3. Selective catheterization of the left external iliac artery with left lower extremity runoff 4. Retrograde right femoral arteriogram with right lower extremity runoff  SURGEON: Judeth Cornfield. Scot Dock, MD, FACS  ANESTHESIA: Local with sedation   EBL: Minimal  TECHNIQUE: The patient was taken to the peripheral vascular lead. He was sedated. The period of conscious sedation was 42 minutes. During that time period, I was present face-to-face 100% of the time.  The patient was administered (1 mg of Versed and 50 g of fentanyl.). The patient's heart rate, blood pressure, and oxygen saturation were monitored by the nurse continuously during the procedure.  Both groins were prepped and draped in usual sterile fashion. After the skin was anesthetized with lidocaine, under ultrasound guidance, the right common femoral artery was cannulated with a micropunch needle and a guidewire introduced. This was exchanged for a 5 [place] sheath over a J-wire. An Omni Flush catheter was positioned at the L1 vertebral body and flush aortogram obtained. The catheter was positioned above the aortic bifurcation and an oblique iliac projection was obtained. Advanced an angled Glidewire into the left external iliac artery after the catheter was positioned into the proximal left common iliac artery. The Omni Flush catheter was exchanged for a straight catheter. Selective left external  iliac arteriogram was obtained with left lower extremity runoff. This catheter was then removed. A retrograde right femoral arteriogram wasn't obtained. The patient was then transferred to the holding area for removal of the sheath. No immediate complications were noted.   FINDINGS:  1. No significant renal artery stenosis is identified. The infrarenal aorta, bilateral common iliac arteries are patent. 2. On the left side, which is the side where he is having rest pain, the external iliac artery and common femoral artery and deep femoral artery are patent. The superficial femoral artery is occluded at its origin. The popliteal artery is occluded. Left femoropopliteal bypass graft is occluded. There is reconstitution of the peroneal artery on the left. The posterior tibial artery is also patent but is very small. The anterior tibial artery and dorsalis pedis artery on the left are occluded. 3. On the right side, the common femoral and deep femoral artery are patent. There is a focal 40% stenosis in the right external iliac artery. The right superficial femoral artery is occluded at its origin. The popliteal artery on the right is occluded. The anterior tibial and posterior tibial arteries are occluded on the right. There is single-vessel runoff on the right via the peroneal artery.  CLINICAL NOTE: This patient has rest pain of the left foot. His only option for revascularization would be a fem peroneal bypass with a prosthetic graft with only small chance of long-term success. I'll see him back in the office as I'm not familiar with this patient and follow him clinically.  Deitra Mayo, MD, FACS [facility]  DATE OF DICTATION:   10/24/2016

## 2016-10-24 NOTE — Discharge Instructions (Signed)
Angiogram, Care After Refer to this sheet in the next few weeks. These instructions provide you with information about caring for yourself after your procedure. Your health care provider may also give you more specific instructions. Your treatment has been planned according to current medical practices, but problems sometimes occur. Call your health care provider if you have any problems or questions after your procedure. WHAT TO EXPECT AFTER THE PROCEDURE After your procedure, it is typical to have the following:  Bruising at the catheter insertion site that usually fades within 1-2 weeks.  Blood collecting in the tissue (hematoma) that may be painful to the touch. It should usually decrease in size and tenderness within 1-2 weeks. HOME CARE INSTRUCTIONS  Take medicines only as directed by your health care provider.  You may shower 24-48 hours after the procedure or as directed by your health care provider. Remove the bandage (dressing) and gently wash the site with plain soap and water. Pat the area dry with a clean towel. Do not rub the site, because this may cause bleeding.  Do not take baths, swim, or use a hot tub until your health care provider approves.  Check your insertion site every day for redness, swelling, or drainage.  Do not apply powder or lotion to the site.  Do not lift over 10 lb (4.5 kg) for 5 days after your procedure or as directed by your health care provider.  Ask your health care provider when it is okay to:  Return to work or school.  Resume usual physical activities or sports.  Resume sexual activity.  Do not drive home if you are discharged the same day as the procedure. Have someone else drive you.  You may drive 24 hours after the procedure unless otherwise instructed by your health care provider.  Do not operate machinery or power tools for 24 hours after the procedure or as directed by your health care provider.  If your procedure was done as an  outpatient procedure, which means that you went home the same day as your procedure, a responsible adult should be with you for the first 24 hours after you arrive home.  Keep all follow-up visits as directed by your health care provider. This is important. SEEK MEDICAL CARE IF:  You have a fever.  You have chills.  You have increased bleeding from the catheter insertion site. Hold pressure on the site. SEEK IMMEDIATE MEDICAL CARE IF:  You have unusual pain at the catheter insertion site.  You have redness, warmth, or swelling at the catheter insertion site.  You have drainage (other than a small amount of blood on the dressing) from the catheter insertion site.  The catheter insertion site is bleeding, and the bleeding does not stop after 30 minutes of holding steady pressure on the site.  The area near or just beyond the catheter insertion site becomes pale, cool, tingly, or numb.   This information is not intended to replace advice given to you by your health care provider. Make sure you discuss any questions you have with your health care provider.   Document Released: 06/30/2005 Document Revised: 01/02/2015 Document Reviewed: 05/15/2013 Elsevier Interactive Patient Education 2016 [place].      Groin Surgical Site Care Refer to this sheet in the next few weeks. These instructions provide you with information about caring for yourself after your procedure. Your health care provider may also give you more specific instructions. Your treatment has been planned according to current medical practices,  but problems sometimes occur. Call your health care provider if you have any problems or questions after your procedure. WHAT TO EXPECT AFTER THE PROCEDURE After your procedure, it is typical to have the following:  Bruising at the groin site that usually fades within 1-2 weeks.  Blood collecting in the tissue (hematoma) that may be painful to the touch. It should usually  decrease in size and tenderness within 1-2 weeks. HOME CARE INSTRUCTIONS  Take medicines only as directed by your health care provider.  You may shower 24-48 hours after the procedure or as directed by your health care provider. Remove the bandage (dressing) and gently wash the site with plain soap and water. Pat the area dry with a clean towel. Do not rub the site, because this may cause bleeding.  Do not take baths, swim, or use a hot tub until your health care provider approves.  Check your insertion site every day for redness, swelling, or drainage.  Do not apply powder or lotion to the site.  Limit use of stairs to twice a day for the first 2-3 days or as directed by your health care provider.  Do not squat for the first 2-3 days or as directed by your health care provider.  Do not lift over 10 lb (4.5 kg) for 5 days after your procedure or as directed by your health care provider.  Ask your health care provider when it is okay to:  Return to work or school.  Resume usual physical activities or sports.  Resume sexual activity.  Do not drive home if you are discharged the same day as the procedure. Have someone else drive you.  You may drive 24 hours after the procedure unless otherwise instructed by your health care provider.  Do not operate machinery or power tools for 24 hours after the procedure or as directed by your health care provider.  If your procedure was done as an outpatient procedure, which means that you went home the same day as your procedure, a responsible adult should be with you for the first 24 hours after you arrive home.  Keep all follow-up visits as directed by your health care provider. This is important. SEEK MEDICAL CARE IF:  You have a fever.  You have chills.  You have increased bleeding from the groin site. Hold pressure on the site. SEEK IMMEDIATE MEDICAL CARE IF:  You have unusual pain at the groin site.  You have redness, warmth, or  swelling at the groin site.  You have drainage (other than a small amount of blood on the dressing) from the groin site.  The groin site is bleeding, and the bleeding does not stop after 30 minutes of holding steady pressure on the site.  Your leg or foot becomes pale, cool, tingly, or numb.   This information is not intended to replace advice given to you by your health care provider. Make sure you discuss any questions you have with your health care provider.   Document Released: 08/15/2014 Document Reviewed: 08/15/2014 Elsevier Interactive Patient Education [employer].

## 2016-10-24 NOTE — H&P (View-Only) (Signed)
[facility] [place]   CC: History of peripheral artery occlusive disease, worsening pain/swelling left lower leg  History of Present Illness [name] is a 65 y.o. male patient of Dr. Kellie Simmering who is s/p: Left CFA to below knee popliteal bypass graft with left saphenous vein on 01/06/16 by Dr. Kellie Simmering, assisted by Dr. Donnetta Hutching.  He was seen 10/10/16 [facility]; also seen on 10/9 by PCP Dr. Alease Frame. Started on doxycycline for cellulitis x10 days.  Also noted depression with PHQ-9 score of 19 Started prozac 10mg x7 days, then up to 20mg  daily  He has a history of cellulitis, depression, hypertension, PAD, iron def anemia, type 2 diabetes, hyperlipidemia, tobacco abuse, GERD, diabetic neuropathy, obesity, osteoarthritis  At his 02/09/16 visit, his left leg had moderate swelling and subsequent separation of several areas of his left leg medial incision and left groin incision due to swelling; it seemed that he was not elevating his legs adequately to promote venous return. He was advised to elevate his leg as much as possible when not walking, his foot above his heart with his back flat. He was also advised to elevate the foot of his bed to let gravity help the swelling at night.  His left groin incision is well healed, his left lower leg incision is well healed with prior separated area on lower leg incision completely granulated in.  The swelling in his left leg has returned. He returns today at the request of his PCP to evaluate left foot and calf pain that started suddenly about 4 weeks ago, he then said 4 months ago when Dr. Scot Dock examined him. Pt states he has had pain and swelling in the left foot and lower leg since the January 2017 surgery. He was supposed to be scheduled for an arteriogram after his July 2017 visit with me, but states he did not want that, does not articulate reasons for this.  He was scheduled to see Dr. Trula Slade on 08/08/16 and left  before being seen.   Pt states his left lower leg feels no different since surgery: burning sensation posterior thigh to toes.  He states his right posterior thigh and calf hurt after taking a couple of steps.  He denies non healing wounds. His surgical incisions have healed.  Pt Diabetic: Yes, 6.7 A1C on 06/24/16 (review of records) Pt smoker: 2 cigars/day  Pt meds include: Statin :Yes Betablocker: Yes ASA: Yes Other anticoagulants/antiplatelets: Plavix, Pletal    Past Medical History:  Diagnosis Date  . Anemia   . Arthritis    OA  . Cervical radiculopathy    Dr. Vertell Limber neurosurgery  . Chronic lower back pain   . Diabetes mellitus    takes Metformin daily  . GERD (gastroesophageal reflux disease)    takes Protonix daily  . History of blood transfusion    "related to low HgB" ((09/10/2015  . Hyperlipidemia    takes Vytorin daily  . Hypertension    takes Benazepril and Bystolic daily  . PAD (peripheral artery disease) ([place])   . Pneumonia   . Shortness of breath dyspnea    with exertion  . Tobacco user   . Toe fracture, right 05/09/2011    Social History Social History  Substance Use Topics  . Smoking status: Current Some Day Smoker    Years: 24.00    Types: Cigars  . Smokeless tobacco: Never Used     Comment: smokes 1-2 cigars per day   . Alcohol use 7.8  oz/week    7 Cans of beer, 6 Shots of liquor per week    Family History Family History  Problem Relation Age of Onset  . Cancer Mother     colon Cancer  . Heart disease Father   . Hypertension Father     Past Surgical History:  Procedure Laterality Date  . ANTERIOR CERVICAL DECOMP/DISCECTOMY FUSION  03/08/12   C6-7  . ANTERIOR CERVICAL DECOMP/DISCECTOMY FUSION  03/08/2012   Procedure: ANTERIOR CERVICAL DECOMPRESSION/DISCECTOMY FUSION 1 LEVEL/HARDWARE REMOVAL;  Surgeon: Erline Levine, MD;  Location: [place] [facility];  Service: Neurosurgery;  Laterality: N/A;  revison of C5-7 anterior cervical decompression  with fusion with Cervical Five-Thoracic One anterior cervical decompression with fusion with interbody prothesis plating and bonegraft  . BACK SURGERY  1996  . COLONOSCOPY W/ BIOPSIES AND POLYPECTOMY  08/17/2012   f/u 5 years, 4 polyps, no high grade dysplasia or malignancy, tubular adenoma, hyperplastic polyops  . ENTEROSCOPY N/A 12/11/2015   Procedure: ENTEROSCOPY;  Surgeon: Carol Ada, MD;  Location: [facility];  Service: Endoscopy;  Laterality: N/A;  . ESOPHAGOGASTRODUODENOSCOPY  08/17/2012   normal esophagus and GEJ, diffuse gastritis with erythema- no malignancy, reactive gastropathy  with focal intestinal metaplasia  . FEMORAL-POPLITEAL BYPASS GRAFT Left 01/06/2016   Procedure: Left  COMMON FEMORAL-BELOW KNEE POPLITEAL ARTERY Bypass using non-reversed translocated saphenous vein graft from left leg;  Surgeon: Mal Misty, MD;  Location: [place];  Service: Vascular;  Laterality: Left;  . GIVENS CAPSULE STUDY N/A 11/24/2015   Procedure: GIVENS CAPSULE STUDY;  Surgeon: Juanita Craver, MD;  Location: [place];  Service: Endoscopy;  Laterality: N/A;  . INGUINAL HERNIA REPAIR  1990's   right  . INTRAOPERATIVE ARTERIOGRAM Left 01/06/2016   Procedure: INTRA OPERATIVE ARTERIOGRAM LEFT LOWER LEG;  Surgeon: Mal Misty, MD;  Location: [place];  Service: Vascular;  Laterality: Left;  . LOWER EXTREMITY ANGIOGRAM Bilateral 07/30/2015   Procedure: Lower Extremity Angiogram;  Surgeon: Conrad Fruitvale, MD;  Location: [place] [facility];  Service: Cardiovascular;  Laterality: Bilateral;  . [place]   "lower"  . PERIPHERAL VASCULAR CATHETERIZATION N/A 07/30/2015   Procedure: Abdominal Aortogram;  Surgeon: Conrad , MD;  Location: [place] [facility];  Service: Cardiovascular;  Laterality: N/A;  . VASCULAR SURGERY  ~ 2007   Stent SFA   . VEIN HARVEST Left 01/06/2016   Procedure: LEFT GREATER SAPPHENOUS VEIN HARVEST;  Surgeon: Mal Misty, MD;  Location: [place];  Service: Vascular;   Laterality: Left;    Allergies  Allergen Reactions  . Glipizide Other (See Comments)    Severe hypoglycemia to 40s.     Current Outpatient Prescriptions  Medication Sig Dispense Refill  . acetaminophen (TYLENOL) 500 MG tablet Take 1,000 mg by mouth every 6 (six) hours as needed for moderate pain.    Marland Kitchen aspirin EC 81 MG tablet Take 162 mg by mouth daily.    . benazepril (LOTENSIN) 5 MG tablet Take 1 tablet (5 mg total) by mouth daily. 90 tablet 3  . cilostazol (PLETAL) 100 MG tablet TAKE 1 TABLET TWICE A DAY 60 tablet 5  . clindamycin (CLEOCIN) 300 MG capsule Take 1 capsule (300 mg total) by mouth 4 (four) times daily. 28 capsule 0  . clopidogrel (PLAVIX) 75 MG tablet TAKE 1 TABLET EVERY DAY 90 tablet 3  . cyclobenzaprine (FLEXERIL) 10 MG tablet TAKE 1 TABLET (10 MG TOTAL) BY MOUTH 3 (THREE) TIMES DAILY AS NEEDED FOR MUSCLE SPASMS.  45 tablet 0  . docusate sodium (COLACE) 100 MG capsule Take 1 capsule (100 mg total) by mouth 2 (two) times daily. 60 capsule 1  . doxycycline (VIBRA-TABS) 100 MG tablet Take 1 tablet (100 mg total) by mouth 2 (two) times daily. 20 tablet 0  . ezetimibe (ZETIA) 10 MG tablet Take 1 tablet (10 mg total) by mouth daily. 90 tablet 3  . ferrous sulfate (FERROUSUL) 325 (65 FE) MG tablet Take 1 tablet (325 mg total) by mouth daily with breakfast. 30 tablet 1  . FLUoxetine (PROZAC) 10 MG capsule Take 1 tablet daily for 7 days. Then increase to 2 tablets daily after that. 60 capsule 3  . gabapentin (NEURONTIN) 800 MG tablet TAKE 1.5 TABLETS (1,200 MG TOTAL) BY MOUTH 3 (THREE) TIMES DAILY. 135 tablet 5  . meloxicam (MOBIC) 15 MG tablet Take 1 tablet (15 mg total) by mouth daily. 14 tablet 0  . metFORMIN (GLUCOPHAGE) 500 MG tablet Take 500 mg by mouth daily.    . Nebivolol HCl (BYSTOLIC) 20 MG TABS Take 1 tablet (20 mg total) by mouth daily. 30 tablet 11  . pantoprazole (PROTONIX) 40 MG tablet Take 1 tablet (40 mg total) by mouth daily. 30 tablet 11  . simvastatin (ZOCOR)  40 MG tablet Take 1 tablet (40 mg total) by mouth at bedtime. 90 tablet 3  . traMADol (ULTRAM) 50 MG tablet Take 1 tablet (50 mg total) by mouth every 12 (twelve) hours as needed for moderate pain. 60 tablet 1   No current facility-administered medications for this visit.     ROS: See HPI for pertinent positives and negatives.   Physical Examination  Vitals:   10/12/16 1030  BP: 118/73  Pulse: (!) 103  Resp: 20  Temp: 98.2 F (36.8 C)  TempSrc: Oral  SpO2: 99%  Weight: 204 lb (92.5 kg)  Height: 5\' 7"  (1.702 m)   Body mass index is 31.95 kg/m.  General: A&O x 3, WDWN, obese male. Gait: antalgic, using a cane Eyes: PERRLA. Pulmonary: Respirations are non labored, CTAB, distant breath sounds, without wheezes , rales or rhonchi. Cardiac: regular rhythm, tachycardic at 103, no detected murmur.         Carotid Bruits Right Left   Negative Negative  Aorta is not palpable. Radial pulses: 2+ palpable and =                           VASCULAR EXAM: Extremities with ischemic changes: left foot and lower leg with 1+ non pitting edema, moderate erythema of all left toes, dependent rubor of left foot and lower leg, very tender to touch. Without Gangrene; without open wounds.  LE Pulses Right Left       FEMORAL  1+palpable (obese) 1+palpable (obese)       POPLITEAL  not palpable Not palpable       POSTERIOR TIBIAL  monophasic by Doppler  no Doppler signal       DORSALIS PEDIS      ANTERIOR TIBIAL No Doppler signal thready Doppler signal       PERONEAL Monophasic Doppler signal No Doppler signal   Abdomen: soft, NT, no palpable masses. Skin: no rashes, no ulcers. See Extremities. Musculoskeletal: no muscle wasting or atrophy.         Neurologic: A&O X 3; Appropriate Affect ; SENSATION: normal; MOTOR FUNCTION:  moving all  extremities equally, motor strength 5/5 throughout. Speech is fluent/normal. CN 2-12 intact.   ASSESSMENT: [name] is a 64 y.o. male who is s/p: Left CFA to below knee popliteal bypass graft with left saphenous vein on 01/06/16 by Dr. Kellie Simmering, assisted by Dr. Donnetta Hutching. In the last 4 weeks to 4 months his left foot and lower leg have become more painful and red, mild swelling. He was advised by Dr. Kellie Simmering to have an arteriogram at his July 04, 2016 visit.  He was scheduled to see Dr. Trula Slade on 08/08/16 and pt left before being seen.   His last creatinine result on file was 0.78 on 01/10/16. He has been taking doxycycline for cellulitis of his left foot/calf since his 10/03/16 visit with his PCP.  Dr. Scot Dock spoke with and examined pt. 1 pm left LE arterial duplex today, ABI's cannot be fit in on the non invasive vascular lab schedule today. Pt agrees to return at 12:15 pm today for the test, states he needs to pick up his wife.  He did return.   DATA Left LE arterial duplex today demonstrates  Inability to adequately visualize the mid and distal segments of the bypass graft; most likely due to chronically occluded graft. No color Doppler flow noted in the left lower extremity bypass graft consistent with occlusion.  Findings of today are consistent with the last exam on 07/04/16.   PLAN:  Based on the patient's vascular studies and examination, and after reviewing left LE arterial duplex results from today with Dr. Scot Dock, pt will be scheduled for arteriogram with bilateral run off, possible intervention. Dr. Scot Dock can perform this tomorrow, but pt states he needs to discuss this with his wife, find out when she can take a day off. Pt states our surgery scheduling nurse can call him later today to try to schedule this.  Dr. Scot Dock also recommends scheduling an MRI with contrast of his left foot to evaluate for possible abscess or osteomyelitis.   The patient was counseled re smoking  cessation and given several free resources re smoking cessation.   I discussed in depth with the patient the nature of atherosclerosis, and emphasized the importance of maximal medical management including strict control of blood pressure, blood glucose, and lipid levels, obtaining regular exercise, and cessation of smoking.  The patient is aware that without maximal medical management the underlying atherosclerotic disease process will progress, limiting the benefit of any interventions.  The patient was given information about PAD including signs, symptoms, treatment, what symptoms should prompt the patient to seek immediate medical care, and risk reduction measures to take.  Clemon Chambers, RN, MSN, FNP-C [facility] Phone: [phone]  Clinic MD: Scot Dock  10/12/16 10:49 AM        -  No evidence of deep vein or superficial thrombosis involving the   left lower extremity and right common femoral vein. - No evidence of Baker&'s cyst on the left.

## 2016-10-24 NOTE — Progress Notes (Addendum)
Site area: RFA Site Prior to Removal:  Level 0 Pressure Applied For:20 min Manual:  yes  Patient Status During Pull:  stable Post Pull Site:  Level 0 Post Pull Instructions Given: yes  Post Pull Pulses Present: doppler Dressing Applied: tegaderm  Bedrest begins @ 1400 till [place] / [place]

## 2016-10-24 NOTE — Progress Notes (Addendum)
Pt has tylenol at home, no other pain meds, Dr Scot Dock paged to ask for at home script for pain meds. I called Dr Nicole Cella office, on call dr/ pa will call back.

## 2016-10-25 ENCOUNTER — Telehealth: Payer: Self-pay | Admitting: Vascular Surgery

## 2016-10-25 ENCOUNTER — Encounter (HOSPITAL_COMMUNITY): Payer: Self-pay | Admitting: Vascular Surgery

## 2016-10-25 ENCOUNTER — Other Ambulatory Visit: Payer: Self-pay | Admitting: *Deleted

## 2016-10-25 MED ORDER — GABAPENTIN 800 MG PO TABS: ORAL_TABLET | ORAL | 11 refills | Status: DC

## 2016-10-25 NOTE — Telephone Encounter (Signed)
Spoke via home # for f/u appt 11/22

## 2016-10-25 NOTE — Telephone Encounter (Signed)
-----   Message from Mena Goes, RN sent at 10/24/2016  2:04 PM EDT ----- Regarding: 3 weeks w/ Scot Dock   ----- Message ----- From: Angelia Mould, MD Sent: 10/24/2016   1:02 PM To: [facility] Charge Pool Subject: charge                                         PROCEDURE:  1. Ultrasound-guided access to the right common femoral artery 2. Aortogram with bilateral iliac arteriogram 3. Selective catheterization of the left external iliac artery with left lower extremity runoff 4. Retrograde right femoral arteriogram with right lower extremity runoff  SURGEON: Judeth Cornfield. Scot Dock, MD, FACS  I'm not from a with this patient. This is a patient of Dr. Evelena Leyden. I will need to see him in about 3 weeks to follow his rest pain of the left foot. CD

## 2016-10-25 NOTE — Telephone Encounter (Signed)
Refill request for 90 day supply.  Martin, Tamika L, RN  

## 2016-10-26 ENCOUNTER — Encounter: Payer: Self-pay | Admitting: Vascular Surgery

## 2016-10-26 ENCOUNTER — Ambulatory Visit (INDEPENDENT_AMBULATORY_CARE_PROVIDER_SITE_OTHER): Payer: Medicare Other | Admitting: Vascular Surgery

## 2016-10-26 VITALS — BP 138/82 | HR 94 | Temp 98.1°F | Resp 24 | Ht 67.0 in | Wt 201.0 lb

## 2016-10-26 DIAGNOSIS — I739 Peripheral vascular disease, unspecified: Secondary | ICD-10-CM | POA: Diagnosis not present

## 2016-10-26 DIAGNOSIS — I779 Disorder of arteries and arterioles, unspecified: Secondary | ICD-10-CM

## 2016-10-26 MED ORDER — OXYCODONE-ACETAMINOPHEN 5-325 MG PO TABS: 1.0000 | ORAL_TABLET | ORAL | 0 refills | Status: DC | PRN

## 2016-10-26 NOTE — Progress Notes (Signed)
Patient name: [name] MRN: [id number] DOB: 03-25-1951 Sex: male  REASON FOR CONSULT: Ischemic left leg.  HPI: [name] is a 65 y.o. male, who underwent a left common femoral artery to below knee popliteal artery bypass with a vein graft by Dr. Kellie Simmering on 01/06/2016. The graft is occluded and he presents with progressive ischemia of the left lower extremity. He was set up for an arteriogram which I performed on 10/24/2016.  His arteriogram shows that on the left side, which is the symptomatic side, he has no significant aortoiliac disease. His left femoropopliteal bypass graft is occluded. The superficial femoral artery and popliteal arteries are occluded. His only runoff is the peroneal artery and a diseased posterior tibial artery.  On my history, the patient states that he has had pain in the left foot since January. His symptoms have gradually progressed and now he has constant pain in the left foot and severe rest pain. He was a walk-in today area  He did smoke tobacco was a teenager but quit many years ago. 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  He is on aspirin, Plavix, and a statin.  Past Medical History:  Diagnosis Date  . Anemia   . Arthritis    OA  . Cervical radiculopathy    Dr. Vertell Limber neurosurgery  . Chronic lower back pain   . Diabetes mellitus    takes Metformin daily  . GERD (gastroesophageal reflux disease)    takes Protonix daily  . History of blood transfusion    "related to low HgB" ((09/10/2015  . Hyperlipidemia    takes Vytorin daily  . Hypertension    takes Benazepril and Bystolic daily  . PAD (peripheral artery disease) ([place])   . Pneumonia   . Shortness of breath dyspnea    with exertion  . Tobacco user   . Toe fracture, right 05/09/2011    Family History  Problem Relation Age of Onset  . Cancer Mother     colon Cancer  . Heart disease Father   . Hypertension Father     SOCIAL HISTORY: Social History   Social History    . Marital status: Legally Separated    Spouse name: N/A  . Number of children: N/A  . Years of education: N/A   Occupational History  . Not on file.   Social History Main Topics  . Smoking status: Current Some Day Smoker    Years: 24.00    Types: Cigars  . Smokeless tobacco: Never Used     Comment: smokes 1-2 cigars per day   . Alcohol use 7.8 oz/week    7 Cans of beer, 6 Shots of liquor per week  . Drug use: No  . Sexual activity: Yes   Other Topics Concern  . Not on file   Social History Narrative  . No narrative on file    Allergies  Allergen Reactions  . Glipizide Other (See Comments)    Severe hypoglycemia to 40s.     Current Outpatient Prescriptions  Medication Sig Dispense Refill  . acetaminophen (TYLENOL) 500 MG tablet Take 1,000 mg by mouth every 6 (six) hours as needed for moderate pain.    Marland Kitchen aspirin EC 81 MG tablet Take 162 mg by mouth daily.    . benazepril (LOTENSIN) 5 MG tablet Take 1 tablet (5 mg total) by mouth daily. 90 tablet 3  . cilostazol (PLETAL) 100 MG tablet TAKE 1 TABLET TWICE A DAY 60  tablet 5  . clopidogrel (PLAVIX) 75 MG tablet TAKE 1 TABLET EVERY DAY 90 tablet 3  . cyclobenzaprine (FLEXERIL) 10 MG tablet TAKE 1 TABLET (10 MG TOTAL) BY MOUTH 3 (THREE) TIMES DAILY AS NEEDED FOR MUSCLE SPASMS. 45 tablet 0  . docusate sodium (COLACE) 100 MG capsule Take 1 capsule (100 mg total) by mouth 2 (two) times daily. 60 capsule 1  . ezetimibe (ZETIA) 10 MG tablet Take 1 tablet (10 mg total) by mouth daily. 90 tablet 3  . FLUoxetine (PROZAC) 10 MG capsule Take 1 tablet daily for 7 days. Then increase to 2 tablets daily after that. (Patient taking differently: Take 10 mg by mouth daily. Take 1 tablet daily for 7 days. Then increase to 2 tablets daily after that.) 60 capsule 3  . gabapentin (NEURONTIN) 800 MG tablet TAKE 1.5 TABLETS (1,200 MG TOTAL) BY MOUTH 3 (THREE) TIMES DAILY. 135 tablet 11  . meloxicam (MOBIC) 15 MG tablet Take 1 tablet (15 mg total) by  mouth daily. 14 tablet 0  . metFORMIN (GLUCOPHAGE) 500 MG tablet Take 500 mg by mouth daily.    . Nebivolol HCl (BYSTOLIC) 20 MG TABS Take 1 tablet (20 mg total) by mouth daily. 30 tablet 11  . oxyCODONE-acetaminophen (PERCOCET/ROXICET) 5-325 MG tablet Take 1 tablet by mouth every 6 (six) hours as needed. 5 tablet 0  . pantoprazole (PROTONIX) 40 MG tablet Take 1 tablet (40 mg total) by mouth daily. 30 tablet 11  . simvastatin (ZOCOR) 40 MG tablet Take 1 tablet by mouth at bedtime.     No current facility-administered medications for this visit.     REVIEW OF SYSTEMS:  [X]  denotes positive finding, [ ]  denotes negative finding Cardiac  Comments:  Chest pain or chest pressure:    Shortness of breath upon exertion:    Short of breath when lying flat:    Irregular heart rhythm:        Vascular    Pain in calf, thigh, or hip brought on by ambulation: X Left leg   Pain in feet at night that wakes you up from your sleep:  X Left foot   Blood clot in your veins:    Leg swelling:         Pulmonary    Oxygen at home:    Productive cough:     Wheezing:         Neurologic    Sudden weakness in arms or legs:     Sudden numbness in arms or legs:     Sudden onset of difficulty speaking or slurred speech:    Temporary loss of vision in one eye:     Problems with dizziness:         Gastrointestinal    Blood in stool:     Vomited blood:         Genitourinary    Burning when urinating:     Blood in urine:        Psychiatric    Major depression:         Hematologic    Bleeding problems:    Problems with blood clotting too easily:        Skin    Rashes or ulcers:        Constitutional    Fever or chills:      PHYSICAL EXAM: Vitals:   10/26/16 0917  BP: 138/82  Pulse: 94  Resp: (!) 24  Temp: 98.1 F (36.7 C)  TempSrc: Oral  SpO2: 98%  Weight: 201 lb (91.2 kg)  Height: 5\' 7"  (1.702 m)    GENERAL: The patient is a well-nourished male, in no acute distress. The vital  signs are documented above. CARDIAC: There is a regular rate and rhythm.  VASCULAR: He has a right carotid bruit. He has palpable femoral pulses. I cannot palpate pedal pulses. He has mild left foot swelling. PULMONARY: There is good air exchange bilaterally without wheezing or rales. ABDOMEN: Soft and non-tender with normal pitched bowel sounds.  MUSCULOSKELETAL: There are no major deformities or cyanosis. NEUROLOGIC: No focal weakness or paresthesias are detected. SKIN: He does have some superficial ulceration on the dorsum of his left foot and also over the great toe. PSYCHIATRIC: The patient has a normal affect.  DATA:   ARTERIOGRAM: The results of his arteriogram are discussed above.  MEDICAL ISSUES:  CRITICAL LIMB ISCHEMIA LEFT LOWER EXTREMITY: This patient underwent a left femoropopliteal bypass graft with vein by Dr. Kellie Simmering in January. This has been chronically occluded. He presents with progressive ischemia of the left lower extremity and clearly a limb threatening situation. His only option for revascularization would be a redo femoral to peroneal artery bypass with a prosthetic graft. The alternative would be primary below-the-knee amputation. I discussed both of these options with the patient. I have offered to proceed with his peroneal bypass Friday. He is strongly opposed to having surgery at this time. I have explained that this isn't limb threatening situation. He will discuss this with his wife and decide what he wishes to do. I have given a prescription for Percocet for pain.  RIGHT CAROTID BRUIT: He has a right carotid bruit. I asked him if he had a duplex and he was sure that he had. There is no record of this in Epic. He will need a carotid duplex scan at these records cannot be located.   Deitra Mayo [facility] [phone]

## 2016-11-02 ENCOUNTER — Other Ambulatory Visit: Payer: Self-pay

## 2016-11-02 ENCOUNTER — Telehealth: Payer: Self-pay

## 2016-11-02 DIAGNOSIS — I739 Peripheral vascular disease, unspecified: Principal | ICD-10-CM

## 2016-11-02 DIAGNOSIS — M79606 Pain in leg, unspecified: Secondary | ICD-10-CM

## 2016-11-02 DIAGNOSIS — I998 Other disorder of circulatory system: Secondary | ICD-10-CM

## 2016-11-02 MED ORDER — OXYCODONE-ACETAMINOPHEN 5-325 MG PO TABS: 1.0000 | ORAL_TABLET | Freq: Four times a day (QID) | ORAL | 0 refills | Status: DC | PRN

## 2016-11-02 NOTE — Telephone Encounter (Signed)
rec'd phone call from pt.  Requested to have a refill on his pain medication for left LE pain.  Scheduled for surgery for Redo (L) Fem-Peroneal BP with prosthetic graft on 11/11/16.  Pt. Stated he will not have enough pain medication to get through until surgery.  Reported he has about 6 tabs left, and is taking 1 tab every 4 hrs. As needed for pain.  Discussed with Dr. Scot Dock.  Recommended to give pt. Oxycodone/ Acetaminophen 5/325 mg; # 30; no refills.  Pt. Was notified he can pick up the written Rx at our office.  Verb. Understanding.

## 2016-11-02 NOTE — Progress Notes (Signed)
800

## 2016-11-08 NOTE — Pre-Procedure Instructions (Signed)
[name]  11/08/2016      [id number] - [place], [place] [id number]  [place] [phone] Phone: [phone] Fax: [phone]    Your procedure is scheduled on  Friday, November 17.  Report to [facility] Admitting at 8:10 AM                Your surgery or procedure is scheduled for 10:10 AM   Call this number if you have problems the morning of surgery: [phone]   Remember:  Do not eat food or drink liquids after midnight Thursday, November 16.  Take these medicines the morning of surgery with A SIP OF WATER : aspirin, cilostazol (PLETAL), ezetimibe (ZETIA), FLUoxetine (PROZAC),  gabapentin (NEURONTIN), Nebivolol HCl (BYSTOLIC).                   DO NOT Take Metformin the Morning of Surgery.                May take if needed and you tolerate it on an empty stomach: oxyCODONE-acetaminophen (PERCOCET/ROXICET).   Stop taking herbal medications, Vitamins, .  Do not take any NSAIDS ie:  Ibuprofen, Advil, Naproxen,meloxicam (MOBIC).  How to Manage Your Diabetes Before and After Surgery  Why is it important to control my blood sugar before and after surgery? . Improving blood sugar levels before and after surgery helps healing and can limit problems. . A way of improving blood sugar control is eating a healthy diet by: o  Eating less sugar and carbohydrates o  Increasing activity/exercise o  Talking with your doctor about reaching your blood sugar goals . High blood sugars (greater than 180 mg/dL) can raise your risk of infections and slow your recovery, so you will need to focus on controlling your diabetes during the weeks before surgery. . Make sure that the doctor who takes care of your diabetes knows about your planned surgery including the date and location.  How do I manage my blood sugar before surgery? . Check your blood sugar at least 4 times a day, starting 2 days before  surgery, to make sure that the level is not too high or low. o Check your blood sugar the morning of your surgery when you wake up and every 2 hours until you get to the Short Stay unit. . If your blood sugar is less than 70 mg/dL, you will need to treat for low blood sugar: o Do not take insulin. o Treat a low blood sugar (less than 70 mg/dL) with  cup of clear juice (cranberry or apple), 4 glucose tablets, OR glucose gel. o Recheck blood sugar in 15 minutes after treatment (to make sure it is greater than 70 mg/dL). If your blood sugar is not greater than 70 mg/dL on recheck, call [phone] for further instructions. . Report your blood sugar to the short stay nurse when you get to Short Stay.  . If you are admitted to the hospital after surgery: o Your blood sugar will be checked by the staff and you will probably be given insulin after surgery (instead of oral diabetes medicines) to make sure you have good blood sugar levels. o The goal for blood sugar control after surgery is 80-180 mg/dL.  WHAT DO I DO ABOUT MY DIABETES MEDICATION?  Marland Kitchen Do not take oral diabetes medicines (pills) the morning of surgery. Patient Signature:  Date:   Nurse Signature:  Date:                 [facility]- Preparing For Surgery  Before surgery, you can play an important role. Because skin is not sterile, your skin needs to be as free of germs as possible. You can reduce the number of germs on your skin by washing with CHG (chlorahexidine gluconate) Soap before surgery.  CHG is an antiseptic cleaner which kills germs and bonds with the skin to continue killing germs even after washing.  Please do not use if you have an allergy to CHG or antibacterial soaps. If your skin becomes reddened/irritated stop using the CHG.  Do not shave (including legs and underarms) for at least 48 hours prior to first CHG shower. It is OK to shave your face.  Please follow these instructions carefully.   1. Shower the NIGHT  BEFORE SURGERY and the MORNING OF SURGERY with CHG.   2. If you chose to wash your hair, wash your hair first as usual with your normal shampoo.  3. After you shampoo, rinse your hair and body thoroughly to remove the shampoo.  4. Use CHG as you would any other liquid soap. You can apply CHG directly to the skin and wash gently with a scrungie or a clean washcloth.   5. Apply the CHG Soap to your body ONLY FROM THE NECK DOWN.  Do not use on open wounds or open sores. Avoid contact with your eyes, ears, mouth and genitals (private parts). Wash genitals (private parts) with your normal soap.  6. Wash thoroughly, paying special attention to the area where your surgery will be performed.  7. Thoroughly rinse your body with warm water from the neck down.  8. DO NOT shower/wash with your normal soap after using and rinsing off the CHG Soap.  9. Pat yourself dry with a CLEAN TOWEL.   10. Wear CLEAN PAJAMAS   11. Place CLEAN SHEETS on your bed the night of your first shower and DO NOT SLEEP WITH PETS. Day of Surgery: Do not apply any deodorants/lotions. Please wear clean clothes to the hospital/surgery center.                     Do not wear jewelry, make-up or nail polish.  Do not wear lotions, powders, or perfumes, or deodorant.  Men may shave face and neck.  Do not bring valuables to the hospital.  [facility] is not responsible for any belongings or valuables.  Contacts, dentures or bridgework may not be worn into surgery.  Leave your suitcase in the car.  After surgery it may be brought to your room.  For patients admitted to the hospital, discharge time will be determined by your treatment team.  Patients discharged the day of surgery will not be allowed to drive home.  Special instructions: -  Please read over the following fact sheets that you were given. [facility]- Preparing For Surgery and Patient Instructions for Mupirocin Application, Pain Booklet

## 2016-11-09 ENCOUNTER — Encounter (HOSPITAL_COMMUNITY)
Admission: RE | Admit: 2016-11-09 | Discharge: 2016-11-09 | Disposition: A | Discharge status: Home / Self Care | Payer: Medicare Other | Source: Ambulatory Visit | Attending: Vascular Surgery | Admitting: Vascular Surgery

## 2016-11-09 ENCOUNTER — Telehealth: Payer: Self-pay

## 2016-11-09 ENCOUNTER — Other Ambulatory Visit: Payer: Self-pay

## 2016-11-09 ENCOUNTER — Encounter (HOSPITAL_COMMUNITY): Payer: Self-pay

## 2016-11-09 ENCOUNTER — Encounter: Payer: Self-pay | Admitting: Vascular Surgery

## 2016-11-09 DIAGNOSIS — E119 Type 2 diabetes mellitus without complications: Secondary | ICD-10-CM

## 2016-11-09 DIAGNOSIS — I739 Peripheral vascular disease, unspecified: Secondary | ICD-10-CM

## 2016-11-09 DIAGNOSIS — D509 Iron deficiency anemia, unspecified: Secondary | ICD-10-CM

## 2016-11-09 DIAGNOSIS — E7800 Pure hypercholesterolemia, unspecified: Secondary | ICD-10-CM | POA: Insufficient documentation

## 2016-11-09 DIAGNOSIS — E669 Obesity, unspecified: Secondary | ICD-10-CM

## 2016-11-09 DIAGNOSIS — Z01812 Encounter for preprocedural laboratory examination: Secondary | ICD-10-CM | POA: Insufficient documentation

## 2016-11-09 DIAGNOSIS — I10 Essential (primary) hypertension: Secondary | ICD-10-CM | POA: Insufficient documentation

## 2016-11-09 DIAGNOSIS — F17200 Nicotine dependence, unspecified, uncomplicated: Secondary | ICD-10-CM | POA: Insufficient documentation

## 2016-11-09 LAB — COMPREHENSIVE METABOLIC PANEL
ALT: 12 U/L — ABNORMAL LOW (ref 17–63)
AST: 23 U/L (ref 15–41)
Albumin: 3.9 g/dL (ref 3.5–5.0)
Alkaline Phosphatase: 66 U/L (ref 38–126)
Anion gap: 11 (ref 5–15)
BUN: 7 mg/dL (ref 6–20)
CO2: 23 mmol/L (ref 22–32)
Calcium: 9.3 mg/dL (ref 8.9–10.3)
Chloride: 101 mmol/L (ref 101–111)
Creatinine, Ser: 0.88 mg/dL (ref 0.61–1.24)
GFR calc Af Amer: >60 mL/min (ref >60)
GFR calc non Af Amer: >60 mL/min (ref >60)
Glucose, Bld: 129 mg/dL — ABNORMAL HIGH (ref 65–99)
Potassium: 3.9 mmol/L (ref 3.5–5.1)
Sodium: 135 mmol/L (ref 135–145)
Total Bilirubin: 0.3 mg/dL (ref 0.3–1.2)
Total Protein: 7.5 g/dL (ref 6.5–8.1)

## 2016-11-09 LAB — CBC
HCT: 28.5 % — ABNORMAL LOW (ref 39.0–52.0)
Hemoglobin: 7.8 g/dL — ABNORMAL LOW (ref 13.0–17.0)
MCH: 15.2 pg — ABNORMAL LOW (ref 26.0–34.0)
MCHC: 27.4 g/dL — ABNORMAL LOW (ref 30.0–36.0)
MCV: 55.6 fL — ABNORMAL LOW (ref 78.0–100.0)
Platelets: 397 10*3/uL (ref 150–400)
RBC: 5.13 MIL/uL (ref 4.22–5.81)
RDW: 22.2 % — ABNORMAL HIGH (ref 11.5–15.5)
WBC: 7 10*3/uL (ref 4.0–10.5)

## 2016-11-09 LAB — URINALYSIS, ROUTINE W REFLEX MICROSCOPIC
Bilirubin Urine: NEGATIVE
Glucose, UA: 250 mg/dL — AB
Hgb urine dipstick: NEGATIVE
Ketones, ur: NEGATIVE mg/dL
Leukocytes, UA: NEGATIVE
Nitrite: NEGATIVE
Protein, ur: NEGATIVE mg/dL
Specific Gravity, Urine: 1.036 — ABNORMAL HIGH (ref 1.005–1.030)
pH: 5 (ref 5.0–8.0)

## 2016-11-09 LAB — PROTIME-INR
INR: 1.13
Prothrombin Time: 14.5 seconds (ref 11.4–15.2)

## 2016-11-09 LAB — GLUCOSE, CAPILLARY: Glucose-Capillary: 153 mg/dL — ABNORMAL HIGH (ref 65–99)

## 2016-11-09 LAB — APTT: aPTT: 31 seconds (ref 24–36)

## 2016-11-09 LAB — SURGICAL PCR SCREEN
MRSA, PCR: NEGATIVE
Staphylococcus aureus: NEGATIVE

## 2016-11-09 NOTE — Progress Notes (Signed)
PCP - Earl Lites Cardiologist - denies  Chest x-ray - not needed EKG - 10/24/16 Stress Test - +10 years ago ECHO - denies Cardiac Cath - Vascular Cath 10/24/16   Will send to anesthesia for review of cardiac records  Sleep apnea score of 6 route to PCP    Patient denies shortness of breath, fever, cough and chest pain at PAT appointment

## 2016-11-09 NOTE — Progress Notes (Signed)
Called Dr Nicole Cella office left message for Arbie Cookey about patients HBG 7.8

## 2016-11-09 NOTE — Telephone Encounter (Signed)
rec'd call from Janett Billow, [place] from Pre-Op at [facility]. Reported hgb. Of 7.8.  Discussed with Dr. Scot Dock.  Recommended to type and crossmatch with 2 units PRBC's, and make Anesthesia aware.  Per Dr. Scot Dock he is okay to go through with the surgery, as long as 2 units PRBC's are set up.  Called Willeen Cass, NP at Anesthesia / [facility].  Made aware of plan.  Verb. Understanding.

## 2016-11-09 NOTE — Progress Notes (Signed)
   11/09/16 0849  OBSTRUCTIVE SLEEP APNEA  Have you ever been diagnosed with sleep apnea through a sleep study? No  Do you snore loudly (loud enough to be heard through closed doors)?  1  Do you often feel tired, fatigued, or sleepy during the daytime (such as falling asleep during driving or talking to someone)? 0  Has anyone observed you stop breathing during your sleep? 1  Do you have, or are you being treated for high blood pressure? 1  BMI more than 35 kg/m2? 0  Age > 50 (1-yes) 1  Neck circumference greater than:Male 16 inches or larger, Male 17inches or larger? 1  Male Gender (Yes=1) 1  Obstructive Sleep Apnea Score 6

## 2016-11-09 NOTE — Progress Notes (Signed)
Attempted to call patient about pre-admission appointment today at 8:15 left message for him to call back

## 2016-11-09 NOTE — Progress Notes (Signed)
Anesthesia Chart Review:  Pt is a 65 year old male scheduled for redo L femoral-peroneal bypass with prosthetic graft on 11/11/2016 with Deitra Mayo, MD.   - PCP is Georges Lynch, MD at the [facility].   PMH includes:  HTN, PAD, DM, hyperlipidemia, anemia, GERD. Current smoker. BMI 31. S/p common femoral-below knee popliteal BG 01/06/16. S/p ACDF 03/08/12.   Medications include: ASA, benazepril, pletal, plavix, zetia, metformin, nebivolol, protonix.  Pt to hold pletal and plavix 7 days before surgery.   Preoperative labs reviewed.   - glucose 129. HgbA1c was 7.6 on 10/03/16.  - H/H 7.8/28.5.  Arbie Cookey from Dr. Nicole Cella office notified and Dr. Scot Dock has ordered 2 units PRBC for pt for DOS.   Pt had trouble with severe anemia (hgb 6) in 08/2015. Had endoscopy 12/11/15. Dx Small bowel AVMs, healing antral ulcer versus antral erosion - nonbleeding.RECOMMENDATIONS: 1) Follow HGB. 2) Transfuse as necessary and repeat EGD/Enteroscopy, if warranted.  Given pt's anemia today at PAT, will route lab results to PCP for further follow up.   10/24/16: NSR. LAD. LVH. Left atrial abnormality  If no changes, I anticipate pt can proceed with surgery as scheduled.   Willeen Cass, FNP-BC [facility] Short Stay [facility]/Anesthesiology Phone: [phone] 11/09/2016 4:39 PM

## 2016-11-11 ENCOUNTER — Inpatient Hospital Stay (HOSPITAL_COMMUNITY): Payer: Medicare Other | Admitting: Emergency Medicine

## 2016-11-11 ENCOUNTER — Inpatient Hospital Stay (HOSPITAL_COMMUNITY): Payer: Medicare Other

## 2016-11-11 ENCOUNTER — Encounter (HOSPITAL_COMMUNITY)
Admission: RE | Disposition: A | Discharge status: Home Health | Payer: Self-pay | Source: Ambulatory Visit | Attending: Vascular Surgery

## 2016-11-11 ENCOUNTER — Inpatient Hospital Stay (HOSPITAL_COMMUNITY)
Admission: RE | Admit: 2016-11-11 | Discharge: 2016-11-20 | DRG: 253 | Disposition: A | Discharge status: Home Health | Payer: Medicare Other | Source: Ambulatory Visit | Attending: Vascular Surgery | Admitting: Vascular Surgery

## 2016-11-11 ENCOUNTER — Encounter (HOSPITAL_COMMUNITY): Payer: Self-pay | Admitting: *Deleted

## 2016-11-11 ENCOUNTER — Inpatient Hospital Stay (HOSPITAL_COMMUNITY): Payer: Medicare Other | Admitting: Certified Registered Nurse Anesthetist

## 2016-11-11 DIAGNOSIS — Z8249 Family history of ischemic heart disease and other diseases of the circulatory system: Secondary | ICD-10-CM

## 2016-11-11 DIAGNOSIS — D62 Acute posthemorrhagic anemia: Secondary | ICD-10-CM | POA: Diagnosis not present

## 2016-11-11 DIAGNOSIS — I9589 Other hypotension: Secondary | ICD-10-CM | POA: Diagnosis not present

## 2016-11-11 DIAGNOSIS — R34 Anuria and oliguria: Secondary | ICD-10-CM | POA: Diagnosis not present

## 2016-11-11 DIAGNOSIS — E1151 Type 2 diabetes mellitus with diabetic peripheral angiopathy without gangrene: Secondary | ICD-10-CM | POA: Diagnosis present

## 2016-11-11 DIAGNOSIS — E8770 Fluid overload, unspecified: Secondary | ICD-10-CM | POA: Diagnosis not present

## 2016-11-11 DIAGNOSIS — T82858A Stenosis of vascular prosthetic devices, implants and grafts, initial encounter: Secondary | ICD-10-CM | POA: Diagnosis not present

## 2016-11-11 DIAGNOSIS — Y838 Other surgical procedures as the cause of abnormal reaction of the patient, or of later complication, without mention of misadventure at the time of the procedure: Secondary | ICD-10-CM | POA: Diagnosis present

## 2016-11-11 DIAGNOSIS — M545 Low back pain: Secondary | ICD-10-CM | POA: Diagnosis present

## 2016-11-11 DIAGNOSIS — Z7984 Long term (current) use of oral hypoglycemic drugs: Secondary | ICD-10-CM | POA: Diagnosis not present

## 2016-11-11 DIAGNOSIS — Z7982 Long term (current) use of aspirin: Secondary | ICD-10-CM

## 2016-11-11 DIAGNOSIS — K219 Gastro-esophageal reflux disease without esophagitis: Secondary | ICD-10-CM | POA: Diagnosis not present

## 2016-11-11 DIAGNOSIS — F17290 Nicotine dependence, other tobacco product, uncomplicated: Secondary | ICD-10-CM | POA: Diagnosis not present

## 2016-11-11 DIAGNOSIS — Z7902 Long term (current) use of antithrombotics/antiplatelets: Secondary | ICD-10-CM | POA: Diagnosis not present

## 2016-11-11 DIAGNOSIS — Z888 Allergy status to other drugs, medicaments and biological substances status: Secondary | ICD-10-CM

## 2016-11-11 DIAGNOSIS — I70222 Atherosclerosis of native arteries of extremities with rest pain, left leg: Secondary | ICD-10-CM | POA: Diagnosis not present

## 2016-11-11 DIAGNOSIS — I739 Peripheral vascular disease, unspecified: Secondary | ICD-10-CM | POA: Diagnosis present

## 2016-11-11 DIAGNOSIS — Z79899 Other long term (current) drug therapy: Secondary | ICD-10-CM | POA: Diagnosis not present

## 2016-11-11 DIAGNOSIS — I10 Essential (primary) hypertension: Secondary | ICD-10-CM | POA: Diagnosis present

## 2016-11-11 DIAGNOSIS — R0602 Shortness of breath: Secondary | ICD-10-CM | POA: Diagnosis not present

## 2016-11-11 DIAGNOSIS — E785 Hyperlipidemia, unspecified: Secondary | ICD-10-CM | POA: Diagnosis not present

## 2016-11-11 DIAGNOSIS — I998 Other disorder of circulatory system: Secondary | ICD-10-CM

## 2016-11-11 DIAGNOSIS — G8929 Other chronic pain: Secondary | ICD-10-CM | POA: Diagnosis present

## 2016-11-11 DIAGNOSIS — Z419 Encounter for procedure for purposes other than remedying health state, unspecified: Secondary | ICD-10-CM

## 2016-11-11 HISTORY — PX: BYPASS GRAFT FEMORAL-PERONEAL: SHX5762

## 2016-11-11 HISTORY — PX: INTRAOPERATIVE ARTERIOGRAM: SHX5157

## 2016-11-11 LAB — GLUCOSE, CAPILLARY
Glucose-Capillary: 140 mg/dL — ABNORMAL HIGH (ref 65–99)
Glucose-Capillary: 92 mg/dL (ref 65–99)
Glucose-Capillary: 109 mg/dL — ABNORMAL HIGH (ref 65–99)
Glucose-Capillary: 153 mg/dL — ABNORMAL HIGH (ref 65–99)

## 2016-11-11 LAB — CBC
HCT: 25.9 % — ABNORMAL LOW (ref 39.0–52.0)
Hemoglobin: 6.8 g/dL — CL (ref 13.0–17.0)
MCH: 14.8 pg — ABNORMAL LOW (ref 26.0–34.0)
MCHC: 26.3 g/dL — ABNORMAL LOW (ref 30.0–36.0)
MCV: 56.2 fL — ABNORMAL LOW (ref 78.0–100.0)
Platelets: 316 10*3/uL (ref 150–400)
RBC: 4.61 MIL/uL (ref 4.22–5.81)
RDW: 22.3 % — ABNORMAL HIGH (ref 11.5–15.5)
WBC: 9.5 10*3/uL (ref 4.0–10.5)

## 2016-11-11 LAB — PREPARE RBC (CROSSMATCH)

## 2016-11-11 SURGERY — CREATION, BYPASS, ARTERIAL, FEMORAL TO PERONEAL, USING GRAFT
Anesthesia: General | Site: Leg Lower | Laterality: Left

## 2016-11-11 MED ORDER — ROCURONIUM BROMIDE 100 MG/10ML IV SOLN
INTRAVENOUS | Status: DC | PRN
  Administered 2016-11-11: 20 mg via INTRAVENOUS
  Administered 2016-11-11 (×2): 30 mg via INTRAVENOUS
  Administered 2016-11-11: 50 mg via INTRAVENOUS

## 2016-11-11 MED ORDER — BENAZEPRIL HCL 5 MG PO TABS
5.0000 mg | ORAL_TABLET | Freq: Every day | ORAL | Status: DC
  Administered 2016-11-12 – 2016-11-20 (×8): 5 mg via ORAL
  Filled 2016-11-11 (×9): qty 1

## 2016-11-11 MED ORDER — PHENYLEPHRINE HCL 10 MG/ML IJ SOLN
INTRAMUSCULAR | Status: AC
  Filled 2016-11-11: qty 2

## 2016-11-11 MED ORDER — FENTANYL CITRATE (PF) 250 MCG/5ML IJ SOLN
INTRAMUSCULAR | Status: AC
  Filled 2016-11-11: qty 5

## 2016-11-11 MED ORDER — 0.9 % SODIUM CHLORIDE (POUR BTL) OPTIME
TOPICAL | Status: DC | PRN
  Administered 2016-11-11: 2000 mL

## 2016-11-11 MED ORDER — LIDOCAINE HCL (CARDIAC) 20 MG/ML IV SOLN
INTRAVENOUS | Status: DC | PRN
  Administered 2016-11-11: 100 mg via INTRAVENOUS

## 2016-11-11 MED ORDER — LACTATED RINGERS IV SOLN
INTRAVENOUS | Status: DC
  Administered 2016-11-11 (×4): via INTRAVENOUS

## 2016-11-11 MED ORDER — HEPARIN SODIUM (PORCINE) 1000 UNIT/ML IJ SOLN
INTRAMUSCULAR | Status: DC | PRN
  Administered 2016-11-11: 8000 [IU] via INTRAVENOUS

## 2016-11-11 MED ORDER — IOPAMIDOL (ISOVUE-300) INJECTION 61%
INTRAVENOUS | Status: DC | PRN
  Administered 2016-11-11: 12 mL via INTRAVENOUS

## 2016-11-11 MED ORDER — ONDANSETRON HCL 4 MG/2ML IJ SOLN
INTRAMUSCULAR | Status: DC | PRN
  Administered 2016-11-11: 4 mg via INTRAVENOUS

## 2016-11-11 MED ORDER — PAPAVERINE HCL 30 MG/ML IJ SOLN
INTRAMUSCULAR | Status: DC | PRN
  Administered 2016-11-11: 60 mg

## 2016-11-11 MED ORDER — CEFUROXIME SODIUM 1.5 G IJ SOLR
1.5000 g | INTRAMUSCULAR | Status: AC
  Administered 2016-11-11: 1.5 g via INTRAVENOUS
  Filled 2016-11-11: qty 1.5

## 2016-11-11 MED ORDER — HYDROMORPHONE HCL 2 MG/ML IJ SOLN
INTRAMUSCULAR | Status: AC
  Administered 2016-11-11: 0.5 mg
  Filled 2016-11-11: qty 1

## 2016-11-11 MED ORDER — EPHEDRINE SULFATE 50 MG/ML IJ SOLN
INTRAMUSCULAR | Status: DC | PRN
  Administered 2016-11-11: 5 mg via INTRAVENOUS

## 2016-11-11 MED ORDER — CHLORHEXIDINE GLUCONATE 4 % EX LIQD: 60.0000 mL | Freq: Once | CUTANEOUS | Status: DC

## 2016-11-11 MED ORDER — MAGNESIUM HYDROXIDE 400 MG/5ML PO SUSP: 30.0000 mL | Freq: Every day | ORAL | Status: DC | PRN

## 2016-11-11 MED ORDER — SODIUM CHLORIDE 0.9 % IV SOLN
INTRAVENOUS | Status: DC | PRN
  Administered 2016-11-11: 500 mL

## 2016-11-11 MED ORDER — CILOSTAZOL 100 MG PO TABS
100.0000 mg | ORAL_TABLET | Freq: Two times a day (BID) | ORAL | Status: DC
  Administered 2016-11-12 – 2016-11-20 (×17): 100 mg via ORAL
  Filled 2016-11-11 (×17): qty 1

## 2016-11-11 MED ORDER — FLUOXETINE HCL 10 MG PO CAPS
10.0000 mg | ORAL_CAPSULE | Freq: Every day | ORAL | Status: DC
  Administered 2016-11-12 – 2016-11-20 (×9): 10 mg via ORAL
  Filled 2016-11-11 (×9): qty 1

## 2016-11-11 MED ORDER — FENTANYL CITRATE (PF) 100 MCG/2ML IJ SOLN
INTRAMUSCULAR | Status: DC | PRN
  Administered 2016-11-11 (×4): 50 ug via INTRAVENOUS
  Administered 2016-11-11: 100 ug via INTRAVENOUS
  Administered 2016-11-11: 50 ug via INTRAVENOUS

## 2016-11-11 MED ORDER — INSULIN ASPART 100 UNIT/ML ~~LOC~~ SOLN
0.0000 [IU] | Freq: Three times a day (TID) | SUBCUTANEOUS | Status: DC
  Administered 2016-11-12: 3 [IU] via SUBCUTANEOUS
  Administered 2016-11-12: 2 [IU] via SUBCUTANEOUS
  Administered 2016-11-12: 3 [IU] via SUBCUTANEOUS
  Administered 2016-11-13: 2 [IU] via SUBCUTANEOUS
  Administered 2016-11-13: 5 [IU] via SUBCUTANEOUS
  Administered 2016-11-14: 3 [IU] via SUBCUTANEOUS
  Administered 2016-11-14 – 2016-11-16 (×3): 2 [IU] via SUBCUTANEOUS
  Administered 2016-11-16: 3 [IU] via SUBCUTANEOUS
  Administered 2016-11-17: 2 [IU] via SUBCUTANEOUS
  Administered 2016-11-17: 3 [IU] via SUBCUTANEOUS
  Administered 2016-11-17 – 2016-11-19 (×3): 2 [IU] via SUBCUTANEOUS
  Administered 2016-11-19: 3 [IU] via SUBCUTANEOUS
  Administered 2016-11-20: 2 [IU] via SUBCUTANEOUS

## 2016-11-11 MED ORDER — OXYCODONE-ACETAMINOPHEN 5-325 MG PO TABS
1.0000 | ORAL_TABLET | ORAL | Status: DC | PRN
  Administered 2016-11-11 – 2016-11-20 (×33): 2 via ORAL
  Filled 2016-11-11 (×36): qty 2

## 2016-11-11 MED ORDER — MELOXICAM 7.5 MG PO TABS
15.0000 mg | ORAL_TABLET | Freq: Every day | ORAL | Status: DC
  Administered 2016-11-12 – 2016-11-20 (×9): 15 mg via ORAL
  Filled 2016-11-11 (×8): qty 2
  Filled 2016-11-11: qty 1

## 2016-11-11 MED ORDER — PHENYLEPHRINE HCL 10 MG/ML IJ SOLN
INTRAMUSCULAR | Status: DC | PRN
  Administered 2016-11-11 (×2): 120 ug via INTRAVENOUS
  Administered 2016-11-11 (×2): 80 ug via INTRAVENOUS

## 2016-11-11 MED ORDER — PHENYLEPHRINE HCL 10 MG/ML IJ SOLN
INTRAVENOUS | Status: DC | PRN
  Administered 2016-11-11: 50 ug/min via INTRAVENOUS

## 2016-11-11 MED ORDER — BISACODYL 10 MG RE SUPP: 10.0000 mg | Freq: Every day | RECTAL | Status: DC | PRN

## 2016-11-11 MED ORDER — THROMBIN 20000 UNITS EX SOLR
CUTANEOUS | Status: AC
  Filled 2016-11-11: qty 20000

## 2016-11-11 MED ORDER — PROPOFOL 10 MG/ML IV BOLUS
INTRAVENOUS | Status: DC | PRN
Start: 2016-11-11 — End: 2016-11-11
  Administered 2016-11-11: 100 mg via INTRAVENOUS

## 2016-11-11 MED ORDER — HEPARIN (PORCINE) IN NACL 100-0.45 UNIT/ML-% IJ SOLN
INTRAMUSCULAR | Status: AC
  Administered 2016-11-11: 500 [IU]/h via INTRAVENOUS
  Filled 2016-11-11: qty 250

## 2016-11-11 MED ORDER — DOCUSATE SODIUM 100 MG PO CAPS
100.0000 mg | ORAL_CAPSULE | Freq: Two times a day (BID) | ORAL | Status: DC
  Administered 2016-11-11 – 2016-11-20 (×18): 100 mg via ORAL
  Filled 2016-11-11 (×18): qty 1

## 2016-11-11 MED ORDER — PROTAMINE SULFATE 10 MG/ML IV SOLN
INTRAVENOUS | Status: DC | PRN
  Administered 2016-11-11: 30 mg via INTRAVENOUS

## 2016-11-11 MED ORDER — ASPIRIN EC 81 MG PO TBEC
81.0000 mg | DELAYED_RELEASE_TABLET | Freq: Every day | ORAL | Status: DC
  Administered 2016-11-12 – 2016-11-20 (×9): 81 mg via ORAL
  Filled 2016-11-11 (×9): qty 1

## 2016-11-11 MED ORDER — SUGAMMADEX SODIUM 200 MG/2ML IV SOLN
INTRAVENOUS | Status: DC | PRN
  Administered 2016-11-11: 200 mg via INTRAVENOUS

## 2016-11-11 MED ORDER — HEPARIN (PORCINE) IN NACL 100-0.45 UNIT/ML-% IJ SOLN
500.0000 [IU]/h | INTRAMUSCULAR | Status: DC
  Administered 2016-11-11 – 2016-11-13 (×2): 500 [IU]/h via INTRAVENOUS
  Filled 2016-11-11 (×2): qty 250

## 2016-11-11 MED ORDER — SODIUM CHLORIDE 0.9 % IV SOLN
INTRAVENOUS | Status: DC
  Administered 2016-11-11: 1000 mL via INTRAVENOUS
  Administered 2016-11-11: 18:00:00 via INTRAVENOUS

## 2016-11-11 MED ORDER — ACETAMINOPHEN 325 MG PO TABS
325.0000 mg | ORAL_TABLET | ORAL | Status: DC | PRN
  Administered 2016-11-12 – 2016-11-13 (×3): 650 mg via ORAL
  Filled 2016-11-11 (×3): qty 2

## 2016-11-11 MED ORDER — MIDAZOLAM HCL 5 MG/5ML IJ SOLN
INTRAMUSCULAR | Status: DC | PRN
  Administered 2016-11-11: 2 mg via INTRAVENOUS

## 2016-11-11 MED ORDER — ALUM & MAG HYDROXIDE-SIMETH 200-200-20 MG/5ML PO SUSP: 15.0000 mL | ORAL | Status: DC | PRN

## 2016-11-11 MED ORDER — SODIUM CHLORIDE 0.9 % IV SOLN: 500.0000 mL | Freq: Once | INTRAVENOUS | Status: DC | PRN

## 2016-11-11 MED ORDER — MAGNESIUM SULFATE 2 GM/50ML IV SOLN
2.0000 g | Freq: Every day | INTRAVENOUS | Status: DC | PRN
  Filled 2016-11-11: qty 50

## 2016-11-11 MED ORDER — NEBIVOLOL HCL 10 MG PO TABS
20.0000 mg | ORAL_TABLET | Freq: Every day | ORAL | Status: DC
  Administered 2016-11-12 – 2016-11-20 (×8): 20 mg via ORAL
  Filled 2016-11-11 (×2): qty 2
  Filled 2016-11-11: qty 4
  Filled 2016-11-11 (×2): qty 2
  Filled 2016-11-11: qty 1
  Filled 2016-11-11 (×4): qty 2

## 2016-11-11 MED ORDER — PAPAVERINE HCL 30 MG/ML IJ SOLN
INTRAMUSCULAR | Status: AC
  Filled 2016-11-11: qty 2

## 2016-11-11 MED ORDER — METOPROLOL TARTRATE 5 MG/5ML IV SOLN: 2.0000 mg | INTRAVENOUS | Status: DC | PRN

## 2016-11-11 MED ORDER — ONDANSETRON HCL 4 MG/2ML IJ SOLN: 4.0000 mg | Freq: Four times a day (QID) | INTRAMUSCULAR | Status: DC | PRN

## 2016-11-11 MED ORDER — PANTOPRAZOLE SODIUM 40 MG PO TBEC
40.0000 mg | DELAYED_RELEASE_TABLET | Freq: Every day | ORAL | Status: DC
  Administered 2016-11-11 – 2016-11-19 (×9): 40 mg via ORAL
  Filled 2016-11-11 (×9): qty 1

## 2016-11-11 MED ORDER — MORPHINE SULFATE (PF) 2 MG/ML IV SOLN
2.0000 mg | INTRAVENOUS | Status: DC | PRN
  Administered 2016-11-12 – 2016-11-17 (×21): 2 mg via INTRAVENOUS
  Filled 2016-11-11 (×21): qty 1

## 2016-11-11 MED ORDER — GABAPENTIN 400 MG PO CAPS
800.0000 mg | ORAL_CAPSULE | Freq: Three times a day (TID) | ORAL | Status: DC
  Administered 2016-11-11 – 2016-11-20 (×26): 800 mg via ORAL
  Filled 2016-11-11 (×27): qty 2

## 2016-11-11 MED ORDER — HYDRALAZINE HCL 20 MG/ML IJ SOLN: 5.0000 mg | INTRAMUSCULAR | Status: DC | PRN

## 2016-11-11 MED ORDER — LABETALOL HCL 5 MG/ML IV SOLN: 10.0000 mg | INTRAVENOUS | Status: DC | PRN

## 2016-11-11 MED ORDER — MEPERIDINE HCL 25 MG/ML IJ SOLN: 6.2500 mg | INTRAMUSCULAR | Status: DC | PRN

## 2016-11-11 MED ORDER — PROPOFOL 10 MG/ML IV BOLUS
INTRAVENOUS | Status: AC
  Filled 2016-11-11: qty 20

## 2016-11-11 MED ORDER — ACETAMINOPHEN 325 MG RE SUPP: 325.0000 mg | RECTAL | Status: DC | PRN

## 2016-11-11 MED ORDER — ONDANSETRON HCL 4 MG/2ML IJ SOLN: 4.0000 mg | Freq: Once | INTRAMUSCULAR | Status: DC | PRN

## 2016-11-11 MED ORDER — GABAPENTIN 800 MG PO TABS
800.0000 mg | ORAL_TABLET | Freq: Three times a day (TID) | ORAL | Status: DC
  Filled 2016-11-11: qty 1

## 2016-11-11 MED ORDER — NEBIVOLOL HCL 10 MG PO TABS
20.0000 mg | ORAL_TABLET | ORAL | Status: AC
  Administered 2016-11-11: 20 mg via ORAL
  Filled 2016-11-11: qty 2

## 2016-11-11 MED ORDER — THROMBIN 20000 UNITS EX SOLR
CUTANEOUS | Status: DC | PRN
  Administered 2016-11-11: 20 mL via TOPICAL

## 2016-11-11 MED ORDER — SODIUM CHLORIDE 0.9 % IV SOLN: INTRAVENOUS | Status: DC

## 2016-11-11 MED ORDER — GUAIFENESIN-DM 100-10 MG/5ML PO SYRP
15.0000 mL | ORAL_SOLUTION | ORAL | Status: DC | PRN
  Filled 2016-11-11: qty 15

## 2016-11-11 MED ORDER — PHENOL 1.4 % MT LIQD: 1.0000 | OROMUCOSAL | Status: DC | PRN

## 2016-11-11 MED ORDER — DEXTROSE 5 % IV SOLN
1.5000 g | Freq: Two times a day (BID) | INTRAVENOUS | Status: AC
  Administered 2016-11-11: 1.5 g via INTRAVENOUS
  Filled 2016-11-11 (×2): qty 1.5

## 2016-11-11 MED ORDER — HYDROMORPHONE HCL 1 MG/ML IJ SOLN
0.2500 mg | INTRAMUSCULAR | Status: DC | PRN
  Administered 2016-11-11 (×3): 0.5 mg via INTRAVENOUS

## 2016-11-11 MED ORDER — EZETIMIBE 10 MG PO TABS
10.0000 mg | ORAL_TABLET | Freq: Every day | ORAL | Status: DC
  Administered 2016-11-12 – 2016-11-20 (×9): 10 mg via ORAL
  Filled 2016-11-11 (×9): qty 1

## 2016-11-11 MED ORDER — POTASSIUM CHLORIDE CRYS ER 20 MEQ PO TBCR: 20.0000 meq | EXTENDED_RELEASE_TABLET | Freq: Every day | ORAL | Status: DC | PRN

## 2016-11-11 MED ORDER — MIDAZOLAM HCL 2 MG/2ML IJ SOLN
INTRAMUSCULAR | Status: AC
  Filled 2016-11-11: qty 2

## 2016-11-11 SURGICAL SUPPLY — 66 items
ADH SKN CLS APL DERMABOND .7 (GAUZE/BANDAGES/DRESSINGS) ×2
BANDAGE ESMARK 6X9 LF (GAUZE/BANDAGES/DRESSINGS) IMPLANT
BNDG CMPR 9X6 STRL LF SNTH (GAUZE/BANDAGES/DRESSINGS) ×1
BNDG ESMARK 6X9 LF (GAUZE/BANDAGES/DRESSINGS) ×2
CANISTER SUCTION 2500CC (MISCELLANEOUS) ×2 IMPLANT
CANNULA VESSEL 3MM 2 BLNT TIP (CANNULA) ×4 IMPLANT
CATH EMB 3FR 80CM (CATHETERS) ×1 IMPLANT
CLIP TI MEDIUM 24 (CLIP) ×2 IMPLANT
CLIP TI WIDE RED SMALL 24 (CLIP) ×2 IMPLANT
CUFF TOURNIQUET SINGLE 18IN (TOURNIQUET CUFF) IMPLANT
CUFF TOURNIQUET SINGLE 24IN (TOURNIQUET CUFF) ×1 IMPLANT
CUFF TOURNIQUET SINGLE 34IN LL (TOURNIQUET CUFF) IMPLANT
CUFF TOURNIQUET SINGLE 44IN (TOURNIQUET CUFF) IMPLANT
DERMABOND ADVANCED (GAUZE/BANDAGES/DRESSINGS) ×2
DERMABOND ADVANCED .7 DNX12 (GAUZE/BANDAGES/DRESSINGS) IMPLANT
DRAIN CHANNEL 15F RND FF W/TCR (WOUND CARE) IMPLANT
DRAPE PROXIMA HALF (DRAPES) IMPLANT
DRAPE X-RAY CASS 24X20 (DRAPES) ×1 IMPLANT
ELECT REM PT RETURN 9FT ADLT (ELECTROSURGICAL) ×2
ELECTRODE REM PT RTRN 9FT ADLT (ELECTROSURGICAL) ×1 IMPLANT
EVACUATOR SILICONE 100CC (DRAIN) IMPLANT
GLOVE BIO SURGEON STRL SZ 6.5 (GLOVE) ×2 IMPLANT
GLOVE BIO SURGEON STRL SZ7.5 (GLOVE) ×2 IMPLANT
GLOVE BIOGEL PI IND STRL 6.5 (GLOVE) IMPLANT
GLOVE BIOGEL PI IND STRL 8 (GLOVE) ×1 IMPLANT
GLOVE BIOGEL PI INDICATOR 6.5 (GLOVE) ×1
GLOVE BIOGEL PI INDICATOR 8 (GLOVE) ×1
GLOVE ECLIPSE 6.5 STRL STRAW (GLOVE) ×1 IMPLANT
GLOVE ECLIPSE 7.5 STRL STRAW (GLOVE) ×2 IMPLANT
GLOVE SURG SS PI 7.0 STRL IVOR (GLOVE) ×2 IMPLANT
GOWN STRL REUS W/ TWL LRG LVL3 (GOWN DISPOSABLE) ×3 IMPLANT
GOWN STRL REUS W/TWL LRG LVL3 (GOWN DISPOSABLE) ×6
GOWN STRL REUS W/TWL XL LVL3 (GOWN DISPOSABLE) ×1 IMPLANT
GRAFT PROPATEN THIN WALL 6X80 (Vascular Products) ×1 IMPLANT
INSERT FOGARTY SM (MISCELLANEOUS) ×1 IMPLANT
KIT BASIN OR (CUSTOM PROCEDURE TRAY) ×2 IMPLANT
KIT ROOM TURNOVER OR (KITS) ×2 IMPLANT
LIQUID BAND (GAUZE/BANDAGES/DRESSINGS) IMPLANT
MARKER GRAFT CORONARY BYPASS (MISCELLANEOUS) IMPLANT
NDL 18GX1X1/2 (RX/OR ONLY) (NEEDLE) IMPLANT
NEEDLE 18GX1X1/2 (RX/OR ONLY) (NEEDLE) ×2 IMPLANT
NS IRRIG 1000ML POUR BTL (IV SOLUTION) ×4 IMPLANT
PACK PERIPHERAL VASCULAR (CUSTOM PROCEDURE TRAY) ×2 IMPLANT
PAD ARMBOARD 7.5X6 YLW CONV (MISCELLANEOUS) ×4 IMPLANT
SET COLLECT BLD 21X3/4 12 (NEEDLE) ×1 IMPLANT
SPONGE GAUZE 4X4 12PLY STER LF (GAUZE/BANDAGES/DRESSINGS) ×1 IMPLANT
SPONGE SURGIFOAM ABS GEL 100 (HEMOSTASIS) IMPLANT
STAPLER VISISTAT (STAPLE) ×1 IMPLANT
STOPCOCK 4 WAY LG BORE MALE ST (IV SETS) ×1 IMPLANT
SUT PROLENE 5 0 C 1 24 (SUTURE) ×2 IMPLANT
SUT PROLENE 6 0 BV (SUTURE) ×6 IMPLANT
SUT PROLENE 7 0 BV 1 (SUTURE) IMPLANT
SUT SILK 2 0 SH (SUTURE) ×3 IMPLANT
SUT SILK 3 0 (SUTURE)
SUT SILK 3-0 18XBRD TIE 12 (SUTURE) IMPLANT
SUT VIC AB 2-0 CTB1 (SUTURE) ×3 IMPLANT
SUT VIC AB 3-0 SH 27 (SUTURE) ×4
SUT VIC AB 3-0 SH 27X BRD (SUTURE) ×2 IMPLANT
SUT VICRYL 4-0 PS2 18IN ABS (SUTURE) ×4 IMPLANT
SYR 20CC LL (SYRINGE) ×1 IMPLANT
SYR 5ML LL (SYRINGE) ×1 IMPLANT
TAPE CLOTH SURG 4X10 WHT LF (GAUZE/BANDAGES/DRESSINGS) ×1 IMPLANT
TRAY FOLEY W/METER SILVER 16FR (SET/KITS/TRAYS/PACK) ×2 IMPLANT
TUBING EXTENTION W/L.L. (IV SETS) ×1 IMPLANT
UNDERPAD 30X30 (UNDERPADS AND DIAPERS) ×2 IMPLANT
WATER STERILE IRR 1000ML POUR (IV SOLUTION) ×2 IMPLANT

## 2016-11-11 NOTE — Progress Notes (Signed)
  Day of Surgery Note    Subjective:  Sleepy but awakes to voice  Vitals:   11/11/16 0822 11/11/16 1443  BP: 132/74 108/84  Pulse: 90 83  Resp: 20 (!) 23  Temp: 98.4 F (36.9 C) 97.5 F (36.4 C)    Incisions:   All incisions are clean and dry without any drainage Extremities:  Brisk doppler signals left peroneal/PT & monophasic DP doppler signal Cardiac:  regular Lungs:  Non labored   Assessment/Plan:  This is a 65 y.o. male who is s/p  1. Redo left common femoral artery exposure  2. Left femoral to peroneal artery bypass using 6 mm PTFE graft (Propaten) 3. Intraoperative arteriogram   -pt doing well in pacu with brisk doppler flow in the left PT/peroneal -heparin 500U/hr to start at 1600 -pt is on Plavix at home for previous SFA stent-Dr. Scot Dock would like to discontinue this and start coumadin for graft patency starting tomorrow night.  If no evidence of bleeding tomorrow, will change over to pharmacy dosing of heparin. -change aspirin from 162mg  daily to 81mg  daily -pt is on Metformin-he did receive contrast intraoperatively and therefore, Metformin not reordered. -to [facility] when bed available   Leontine Locket, PA-C 11/11/2016 3:31 PM [phone]

## 2016-11-11 NOTE — Progress Notes (Signed)
Hgb 6.8, down from 7.8 on 11/09/16.  Unable to locate right pedal pulse with doppler.  Dr. Bridgett Larsson notified and made aware of hgb and pulse.  No new orders given. Will continue to monitor.

## 2016-11-11 NOTE — Anesthesia Postprocedure Evaluation (Signed)
Anesthesia Post Note  Patient: [name]  Procedure(s) Performed: Procedure(s) (LRB): REDO LEFT FEMORAL-PERONEAL BYPASS WITH PROPATEN 6MM X 80CM GRAFT (Left) INTRA OPERATIVE ARTERIOGRAM LEFT LOWER EXTRIMITY (Left)  Patient location during evaluation: PACU Anesthesia Type: General Level of consciousness: awake and alert Pain management: pain level controlled Vital Signs Assessment: post-procedure vital signs reviewed and stable Respiratory status: spontaneous breathing, nonlabored ventilation, respiratory function stable and patient connected to nasal cannula oxygen Cardiovascular status: blood pressure returned to baseline and stable Postop Assessment: no signs of nausea or vomiting Anesthetic complications: no    Last Vitals:  Vitals:   11/11/16 1630 11/11/16 1645  BP: 120/63 96/61  Pulse: 72 71  Resp: 17 18  Temp:      Last Pain:  Vitals:   11/11/16 1615  TempSrc:   PainSc: Asleep                 Davonne Jarnigan DAVID

## 2016-11-11 NOTE — Op Note (Signed)
NAME: [name]   MRN: [id number] DOB: 1951-10-27    DATE OF OPERATION: 11/11/2016  PREOP DIAGNOSIS: Critical limb ischemia left lower extremity  POSTOP DIAGNOSIS: Same  PROCEDURE:  1. Redo left common femoral artery exposure  2. Left femoral to peroneal artery bypass using 6 mm PTFE graft (Propaten) 3. Intraoperative arteriogram   SURGEON: Judeth Cornfield. Scot Dock, MD, FACS  ASSIST: Leontine Locket, PA   EBL: 100 cc  INDICATIONS: [name] is a 65 y.o. male who had undergone a left thumb below knee popliteal artery bypass by Dr. Kellie Simmering with a vein graft. This silently occluded and he presented with rest pain and progressive ischemia of the left foot with a nonhealing wound on the dorsum of his foot. He underwent an arteriogram which showed his only option for revascularization was a femoral to peroneal artery bypass. He did not have any usable vein in there for a prosthetic bypass was felt to be his last remaining option.  FINDINGS: Small peroneal artery. The distal saphenous vein was small and sclerotic and not usable for a composite bypass.   TECHNIQUE: The patient was taken to the operating room and received a general anesthetic. The left lower extremity was prepped and draped in usual sterile fashion. The previous incision in the left groin was opened. Through dense scar tissue I was able to expose the common femoral artery proximal and distal to the previous anastomosis. This bypass graft was occluded. Next a longitudinal incision was made in the mid calf and the soleus muscle divided. This allowed exposure of the posterior tibial artery and vein. This was retracted posteriorly and the peroneal artery and vein were identified. The vein laid on top of the artery. I was able to expose the artery which was quite small. I irrigated with papaverine to try to break any vasospasm.  I was hoping to find a small segment of saphenous vein distally to sew to the prosthetic graft for a  composite bypass. A longitudinal incision was made over the saphenous vein distally. The vein was very small and sclerotic and was not usable. This incision was closed with staples.  A tunnel was created from the mid calf incision to the groin incision and the patient was heparinized. A 6 mm PTFE graft was tunneled between the 2 incisions. The common femoral artery was clamped proximally and distally and the hood of the old anastomosis was opened and then spatulated. The graft was spatulated and sewn end to side to a remnant of the foot of the old vein bypass graft using continuous 6-0 Prolene suture. Prior to completing this anastomosis I tested the inflow and it was excellent inflow. The anastomosis was completed. The graft had been clamped.  The graft was then pulled down for anastomosis to the peroneal artery. A tourniquet was placed on the upper thigh. The leg was exsanguinated with an Esmarch bandage and the tourniquet inflated to 300 mmHg. Under tourniquet control, a longitudinal arteriotomy was made in the peroneal artery. The graft was cut to appropriate length, spatulated, and sewn end to side to the peroneal artery using continuous 6-0 Prolene suture. Prior to completing this anastomosis the tourniquet was released. The artery was backbled and flushed appropriately and the anastomosis completed. Flow was reestablished to the left leg. There was good diastolic flow distal to the anastomosis. Completion arteriogram was obtained and showed that the artery was very small however some of this may have been spasm. There was a good peroneal  signal at the completion. Hemostasis was obtained in the wounds.  The groin incision was closed with 2 deep layers of 2-0 Vicryl, the subcutaneous layer was closed with 3-0 Vicryl, and the skin was closed with 4-0 Vicryl. The below the knee incision was closed with a deep layer of 2-0 Vicryl, a subcutaneous layer of 3-0 Vicryl, and the skin closed with 4-0 Vicryl.  Sterile dressing was applied. The patient tolerated the procedure well and was transferred to the recovery room in stable condition. All needle and sponge counts were correct.  Deitra Mayo, MD, FACS [facility]  DATE OF DICTATION:   11/11/2016

## 2016-11-11 NOTE — Transfer of Care (Signed)
Immediate Anesthesia Transfer of Care Note  Patient: [name]  Procedure(s) Performed: Procedure(s): REDO LEFT FEMORAL-PERONEAL BYPASS WITH PROPATEN 6MM X 80CM GRAFT (Left) INTRA OPERATIVE ARTERIOGRAM LEFT LOWER EXTRIMITY (Left)  Patient Location:    Anesthesia Type:General  Level of Consciousness: awake, alert  and oriented  Airway & Oxygen Therapy: Patient Spontanous Breathing and Patient connected to nasal cannula oxygen  Post-op Assessment: Report given to RN and Post -op Vital signs reviewed and stable  Post vital signs: Reviewed and stable  Last Vitals:  Vitals:   11/11/16 0822  BP: 132/74  Pulse: 90  Resp: 20  Temp: 36.9 C    Last Pain:  Vitals:   11/11/16 0851  TempSrc:   PainSc: 10-Worst pain ever      Patients Stated Pain Goal: 1 ([id number])  Complications: No apparent anesthesia complications

## 2016-11-11 NOTE — Interval H&P Note (Signed)
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 After consideration of risks, benefits and other options for treatment, the patient has consented to  Procedure(s): REDO LEFT FEMORAL-PERONEAL BYPASS WITH PROSTHETIC GRAFT (Left) as a surgical intervention .  The patient's history has been reviewed, patient examined, no change in status, stable for surgery.  I have reviewed the patient's chart and labs.  Questions were answered to the patient's satisfaction.     Deitra Mayo

## 2016-11-11 NOTE — Anesthesia Procedure Notes (Signed)
Procedure Name: Intubation Date/Time: 11/11/2016 10:40 AM Performed by: Clearnce Sorrel Pre-anesthesia Checklist: Patient identified, Emergency Drugs available, Suction available, Patient being monitored and Timeout performed Patient Re-evaluated:Patient Re-evaluated prior to inductionOxygen Delivery Method: Circle system utilized Preoxygenation: Pre-oxygenation with 100% oxygen Intubation Type: IV induction Ventilation: Mask ventilation without difficulty and Oral airway inserted - appropriate to patient size Laryngoscope Size: Mac and 3 Grade View: Grade II Tube type: Oral Number of attempts: 1 Airway Equipment and Method: Stylet Placement Confirmation: ETT inserted through vocal cords under direct vision,  positive ETCO2 and breath sounds checked- equal and bilateral Secured at: 22 cm Tube secured with: Tape Dental Injury: Teeth and Oropharynx as per pre-operative assessment

## 2016-11-11 NOTE — Progress Notes (Signed)
Zoila Shutter PA at bedside to check pulses in LLE with doppler

## 2016-11-11 NOTE — H&P (View-Only) (Signed)
Patient name: [name] MRN: [id number] DOB: January 20, 1951 Sex: male  REASON FOR CONSULT: Ischemic left leg.  HPI: Brian HELMLY is a 65 y.o. male, who underwent a left common femoral artery to below knee popliteal artery bypass with a vein graft by Dr. Kellie Simmering on 01/06/2016. The graft is occluded and he presents with progressive ischemia of the left lower extremity. He was set up for an arteriogram which I performed on 10/24/2016.  His arteriogram shows that on the left side, which is the symptomatic side, he has no significant aortoiliac disease. His left femoropopliteal bypass graft is occluded. The superficial femoral artery and popliteal arteries are occluded. His only runoff is the peroneal artery and a diseased posterior tibial artery.  On my history, the patient states that he has had pain in the left foot since January. His symptoms have gradually progressed and now he has constant pain in the left foot and severe rest pain. He was a walk-in today area  He did smoke tobacco was a teenager but quit many years ago. 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  He is on aspirin, Plavix, and a statin.  Past Medical History:  Diagnosis Date  . Anemia   . Arthritis    OA  . Cervical radiculopathy    Dr. Vertell Limber neurosurgery  . Chronic lower back pain   . Diabetes mellitus    takes Metformin daily  . GERD (gastroesophageal reflux disease)    takes Protonix daily  . History of blood transfusion    "related to low HgB" ((09/10/2015  . Hyperlipidemia    takes Vytorin daily  . Hypertension    takes Benazepril and Bystolic daily  . PAD (peripheral artery disease) ([place])   . Pneumonia   . Shortness of breath dyspnea    with exertion  . Tobacco user   . Toe fracture, right 05/09/2011    Family History  Problem Relation Age of Onset  . Cancer Mother     colon Cancer  . Heart disease Father   . Hypertension Father     SOCIAL HISTORY: Social History   Social History    . Marital status: Legally Separated    Spouse name: N/A  . Number of children: N/A  . Years of education: N/A   Occupational History  . Not on file.   Social History Main Topics  . Smoking status: Current Some Day Smoker    Years: 24.00    Types: Cigars  . Smokeless tobacco: Never Used     Comment: smokes 1-2 cigars per day   . Alcohol use 7.8 oz/week    7 Cans of beer, 6 Shots of liquor per week  . Drug use: No  . Sexual activity: Yes   Other Topics Concern  . Not on file   Social History Narrative  . No narrative on file    Allergies  Allergen Reactions  . Glipizide Other (See Comments)    Severe hypoglycemia to 40s.     Current Outpatient Prescriptions  Medication Sig Dispense Refill  . acetaminophen (TYLENOL) 500 MG tablet Take 1,000 mg by mouth every 6 (six) hours as needed for moderate pain.    Marland Kitchen aspirin EC 81 MG tablet Take 162 mg by mouth daily.    . benazepril (LOTENSIN) 5 MG tablet Take 1 tablet (5 mg total) by mouth daily. 90 tablet 3  . cilostazol (PLETAL) 100 MG tablet TAKE 1 TABLET TWICE A DAY 60  tablet 5  . clopidogrel (PLAVIX) 75 MG tablet TAKE 1 TABLET EVERY DAY 90 tablet 3  . cyclobenzaprine (FLEXERIL) 10 MG tablet TAKE 1 TABLET (10 MG TOTAL) BY MOUTH 3 (THREE) TIMES DAILY AS NEEDED FOR MUSCLE SPASMS. 45 tablet 0  . docusate sodium (COLACE) 100 MG capsule Take 1 capsule (100 mg total) by mouth 2 (two) times daily. 60 capsule 1  . ezetimibe (ZETIA) 10 MG tablet Take 1 tablet (10 mg total) by mouth daily. 90 tablet 3  . FLUoxetine (PROZAC) 10 MG capsule Take 1 tablet daily for 7 days. Then increase to 2 tablets daily after that. (Patient taking differently: Take 10 mg by mouth daily. Take 1 tablet daily for 7 days. Then increase to 2 tablets daily after that.) 60 capsule 3  . gabapentin (NEURONTIN) 800 MG tablet TAKE 1.5 TABLETS (1,200 MG TOTAL) BY MOUTH 3 (THREE) TIMES DAILY. 135 tablet 11  . meloxicam (MOBIC) 15 MG tablet Take 1 tablet (15 mg total) by  mouth daily. 14 tablet 0  . metFORMIN (GLUCOPHAGE) 500 MG tablet Take 500 mg by mouth daily.    . Nebivolol HCl (BYSTOLIC) 20 MG TABS Take 1 tablet (20 mg total) by mouth daily. 30 tablet 11  . oxyCODONE-acetaminophen (PERCOCET/ROXICET) 5-325 MG tablet Take 1 tablet by mouth every 6 (six) hours as needed. 5 tablet 0  . pantoprazole (PROTONIX) 40 MG tablet Take 1 tablet (40 mg total) by mouth daily. 30 tablet 11  . simvastatin (ZOCOR) 40 MG tablet Take 1 tablet by mouth at bedtime.     No current facility-administered medications for this visit.     REVIEW OF SYSTEMS:  [X]  denotes positive finding, [ ]  denotes negative finding Cardiac  Comments:  Chest pain or chest pressure:    Shortness of breath upon exertion:    Short of breath when lying flat:    Irregular heart rhythm:        Vascular    Pain in calf, thigh, or hip brought on by ambulation: X Left leg   Pain in feet at night that wakes you up from your sleep:  X Left foot   Blood clot in your veins:    Leg swelling:         Pulmonary    Oxygen at home:    Productive cough:     Wheezing:         Neurologic    Sudden weakness in arms or legs:     Sudden numbness in arms or legs:     Sudden onset of difficulty speaking or slurred speech:    Temporary loss of vision in one eye:     Problems with dizziness:         Gastrointestinal    Blood in stool:     Vomited blood:         Genitourinary    Burning when urinating:     Blood in urine:        Psychiatric    Major depression:         Hematologic    Bleeding problems:    Problems with blood clotting too easily:        Skin    Rashes or ulcers:        Constitutional    Fever or chills:      PHYSICAL EXAM: Vitals:   10/26/16 0917  BP: 138/82  Pulse: 94  Resp: (!) 24  Temp: 98.1 F (36.7 C)  TempSrc: Oral  SpO2: 98%  Weight: 201 lb (91.2 kg)  Height: 5\' 7"  (1.702 m)    GENERAL: The patient is a well-nourished male, in no acute distress. The vital  signs are documented above. CARDIAC: There is a regular rate and rhythm.  VASCULAR: He has a right carotid bruit. He has palpable femoral pulses. I cannot palpate pedal pulses. He has mild left foot swelling. PULMONARY: There is good air exchange bilaterally without wheezing or rales. ABDOMEN: Soft and non-tender with normal pitched bowel sounds.  MUSCULOSKELETAL: There are no major deformities or cyanosis. NEUROLOGIC: No focal weakness or paresthesias are detected. SKIN: He does have some superficial ulceration on the dorsum of his left foot and also over the great toe. PSYCHIATRIC: The patient has a normal affect.  DATA:   ARTERIOGRAM: The results of his arteriogram are discussed above.  MEDICAL ISSUES:  CRITICAL LIMB ISCHEMIA LEFT LOWER EXTREMITY: This patient underwent a left femoropopliteal bypass graft with vein by Dr. Kellie Simmering in January. This has been chronically occluded. He presents with progressive ischemia of the left lower extremity and clearly a limb threatening situation. His only option for revascularization would be a redo femoral to peroneal artery bypass with a prosthetic graft. The alternative would be primary below-the-knee amputation. I discussed both of these options with the patient. I have offered to proceed with his peroneal bypass Friday. He is strongly opposed to having surgery at this time. I have explained that this isn't limb threatening situation. He will discuss this with his wife and decide what he wishes to do. I have given a prescription for Percocet for pain.  RIGHT CAROTID BRUIT: He has a right carotid bruit. I asked him if he had a duplex and he was sure that he had. There is no record of this in Epic. He will need a carotid duplex scan at these records cannot be located.   Deitra Mayo [facility] [place] [phone]

## 2016-11-11 NOTE — Anesthesia Preprocedure Evaluation (Signed)
Anesthesia Evaluation  Patient identified by MRN, date of birth, ID band Patient awake    Reviewed: Allergy & Precautions, NPO status , Patient's Chart, lab work & pertinent test results  Airway Mallampati: I  TM Distance: >3 FB Neck ROM: Full    Dental   Pulmonary Current Smoker,    Pulmonary exam normal        Cardiovascular hypertension, + Peripheral Vascular Disease  Normal cardiovascular exam     Neuro/Psych Depression    GI/Hepatic GERD  Medicated and Controlled,  Endo/Other  diabetes, Type 2, Oral Hypoglycemic Agents  Renal/GU      Musculoskeletal   Abdominal   Peds  Hematology   Anesthesia Other Findings   Reproductive/Obstetrics                             Anesthesia Physical Anesthesia Plan  ASA: III  Anesthesia Plan: General   Post-op Pain Management:    Induction: Intravenous  Airway Management Planned: Oral ETT  Additional Equipment:   Intra-op Plan:   Post-operative Plan: Extubation in OR  Informed Consent: I have reviewed the patients History and Physical, chart, labs and discussed the procedure including the risks, benefits and alternatives for the proposed anesthesia with the patient or authorized representative who has indicated his/her understanding and acceptance.     Plan Discussed with: CRNA and Surgeon  Anesthesia Plan Comments:         Anesthesia Quick Evaluation

## 2016-11-12 LAB — CBC
HCT: 23.9 % — ABNORMAL LOW (ref 39.0–52.0)
Hemoglobin: 6.4 g/dL — CL (ref 13.0–17.0)
MCH: 15.1 pg — ABNORMAL LOW (ref 26.0–34.0)
MCHC: 26.8 g/dL — ABNORMAL LOW (ref 30.0–36.0)
MCV: 56.2 fL — ABNORMAL LOW (ref 78.0–100.0)
Platelets: 298 10*3/uL (ref 150–400)
RBC: 4.25 MIL/uL (ref 4.22–5.81)
RDW: 22.3 % — ABNORMAL HIGH (ref 11.5–15.5)
WBC: 7.8 10*3/uL (ref 4.0–10.5)

## 2016-11-12 LAB — GLUCOSE, CAPILLARY
Glucose-Capillary: 126 mg/dL — ABNORMAL HIGH (ref 65–99)
Glucose-Capillary: 175 mg/dL — ABNORMAL HIGH (ref 65–99)
Glucose-Capillary: 143 mg/dL — ABNORMAL HIGH (ref 65–99)
Glucose-Capillary: 148 mg/dL — ABNORMAL HIGH (ref 65–99)

## 2016-11-12 LAB — HEMOGLOBIN AND HEMATOCRIT, BLOOD
HCT: 26.2 % — ABNORMAL LOW (ref 39.0–52.0)
Hemoglobin: 7.3 g/dL — ABNORMAL LOW (ref 13.0–17.0)

## 2016-11-12 LAB — BASIC METABOLIC PANEL
Anion gap: 8 (ref 5–15)
BUN: 11 mg/dL (ref 6–20)
CO2: 24 mmol/L (ref 22–32)
Calcium: 8.2 mg/dL — ABNORMAL LOW (ref 8.9–10.3)
Chloride: 103 mmol/L (ref 101–111)
Creatinine, Ser: 0.98 mg/dL (ref 0.61–1.24)
GFR calc Af Amer: >60 mL/min (ref >60)
GFR calc non Af Amer: >60 mL/min (ref >60)
Glucose, Bld: 134 mg/dL — ABNORMAL HIGH (ref 65–99)
Potassium: 3.9 mmol/L (ref 3.5–5.1)
Sodium: 135 mmol/L (ref 135–145)

## 2016-11-12 LAB — PREPARE RBC (CROSSMATCH)

## 2016-11-12 LAB — TROPONIN I: Troponin I: <0.03 ng/mL (ref <0.03)

## 2016-11-12 LAB — APTT: aPTT: 37 seconds — ABNORMAL HIGH (ref 24–36)

## 2016-11-12 LAB — HEPARIN LEVEL (UNFRACTIONATED): Heparin Unfractionated: <0.1 IU/mL — ABNORMAL LOW (ref 0.30–0.70)

## 2016-11-12 MED ORDER — FUROSEMIDE 10 MG/ML IJ SOLN
20.0000 mg | Freq: Once | INTRAMUSCULAR | Status: AC
  Administered 2016-11-12: 20 mg via INTRAVENOUS
  Filled 2016-11-12: qty 2

## 2016-11-12 MED ORDER — SODIUM CHLORIDE 0.9 % IV SOLN
Freq: Once | INTRAVENOUS | Status: AC
  Administered 2016-11-12: 09:00:00 via INTRAVENOUS

## 2016-11-12 MED ORDER — SODIUM CHLORIDE 0.9 % IV SOLN
INTRAVENOUS | Status: AC
  Administered 2016-11-12: 18:00:00 via INTRAVENOUS

## 2016-11-12 NOTE — Evaluation (Signed)
Physical Therapy Evaluation Patient Details Name: [name] MRN: [id number] DOB: October 24, 1951 Today's Date: 11/12/2016   History of Present Illness  Patient is a 64 y/o male with hx of left common fem below knee pop artery bypass, chronic low back pain, anemia, PAD, HTN, HLD presents s/p Redo left common femoral artery exposure and Left femoral to peroneal artery bypass graft. post op blood loss now s/p 1 of 3 units of blood.  Clinical Impression  Patient presents with pain, soft BP and post surgical deficits s/p above. Pain is biggest limiting factor today. Tolerated taking a few steps to chair with Min A for balance/safety. Pt requires mod A of 2 to stand from low surfaces. Pt's wife works 3rd shift. Will follow acutely to maximize independence and mobility prior to return home.    Follow Up Recommendations Home health PT;Supervision for mobility/OOB;Supervision/Assistance - 24 hour    Equipment Recommendations  None recommended by PT    Recommendations for Other Services OT consult     Precautions / Restrictions Precautions Precautions: Fall Restrictions Weight Bearing Restrictions: No      Mobility  Bed Mobility Overal bed mobility: Needs Assistance Bed Mobility: Supine to Sit     Supine to sit: Min assist;HOB elevated     General bed mobility comments: Assist to elevate trunk to get to EOB. use of rail. Increased time and effort.   Transfers Overall transfer level: Needs assistance Equipment used: Rolling walker (2 wheeled) Transfers: Sit to/from Stand Sit to Stand: Mod assist;+2 physical assistance         General transfer comment: Assist of 2 to boost from EOB with cues for hand placement/technique. Transferred to chair post ambulation bout.  Ambulation/Gait Ambulation/Gait assistance: Min assist Ambulation Distance (Feet): 4 Feet Assistive device: Rolling walker (2 wheeled) Gait Pattern/deviations: Step-to pattern;Trunk flexed;Decreased stance time -  left;Decreased step length - right Gait velocity: decreased Gait velocity interpretation: Below normal speed for age/gender General Gait Details: Pt with difficulty placing weight through LLE; cues to straighten LLE ins tanding, fatigues and leaning on RW for support. Limited by pain.  Stairs            Wheelchair Mobility    Modified Rankin (Stroke Patients Only)       Balance Overall balance assessment: Needs assistance Sitting-balance support: Feet supported;Single extremity supported Sitting balance-Leahy Scale: Fair Sitting balance - Comments: Requires total A to donn socks.   Standing balance support: During functional activity;Bilateral upper extremity supported Standing balance-Leahy Scale: Poor Standing balance comment: Reliant on BUEs for support in standing.                             Pertinent Vitals/Pain Pain Assessment: 0-10 Pain Score: 7  Pain Location: LLE Pain Descriptors / Indicators: Sore;Operative site guarding;Aching;Guarding;Tender Pain Intervention(s): Monitored during session;Repositioned;Premedicated before session;Limited activity within patient's tolerance    Home Living Family/patient expects to be discharged to:: Private residence Living Arrangements: Spouse/significant other Available Help at Discharge: Family;Available PRN/intermittently;Friend(s) (spouse works at night) Type of Home: House Home Access: [place]: One [place]: [place];Other (comment);Walker - 2 wheels (walking stick) Additional Comments: has to try to find his RW.    Prior Function Level of Independence: Independent with assistive device(s)         Comments: PTA used walking stick at all times     Hand Dominance   Dominant Hand: Right  Extremity/Trunk Assessment   Upper Extremity Assessment: Defer to OT evaluation           Lower Extremity Assessment: LLE deficits/detail   LLE Deficits /  Details: Sensitive to light touch; tender. Able to perform LAQ and move LLE in guarded, slow movements.     Communication   Communication: No difficulties  Cognition Arousal/Alertness: Awake/alert Behavior During Therapy: WFL for tasks assessed/performed Overall Cognitive Status: Within Functional Limits for tasks assessed                      General Comments General comments (skin integrity, edema, etc.): Pt's incision oozing some blood during standing/gait. RN notified. BP sitting EOB 101/59, BP post transfer to chair 94/56, after 2 minutes 92/55. Pt pallor but reports feeling okay. RN present.    Exercises     Assessment/Plan    PT Assessment Patient needs continued PT services  PT Problem List Decreased strength;Decreased mobility;Decreased range of motion;Decreased skin integrity;Decreased activity tolerance;Decreased balance;Pain          PT Treatment Interventions Gait training;Therapeutic exercise;Patient/family education;Therapeutic activities;Balance training;Functional mobility training    PT Goals (Current goals can be found in the Care Plan section)  Acute Rehab PT Goals Patient Stated Goal: to get out of this bed PT Goal Formulation: With patient Time For Goal Achievement: 11/26/16 Potential to Achieve Goals: Good    Frequency Min 3X/week   Barriers to discharge Decreased caregiver support      Co-evaluation               End of Session Equipment Utilized During Treatment: Gait belt Activity Tolerance: Patient limited by pain Patient left: in chair;with call bell/phone within reach;with nursing/sitter in room (RN present to start another unit of blood) Nurse Communication: Mobility status         Time: 1330-1400 PT Time Calculation (min) (ACUTE ONLY): 30 min   Charges:   PT Evaluation $PT Eval Moderate Complexity: 1 Procedure PT Treatments $Therapeutic Activity: 8-22 mins   PT G Codes:        Careem Yasui A Lary Eckardt 11/12/2016,  2:29 PM Wray Kearns, [place], DPT [phone]

## 2016-11-12 NOTE — Progress Notes (Addendum)
Progress Note    11/12/2016 7:53 AM 1 Day Post-Op  Subjective:  C/o pain in his groin and below knee incision and his foot.  (foot pain is unchanged from pre-op). C/o shooting pain through his foot occasionally.  afebrile [id number] systolic HR [id number] NSR 100% RA  Vitals:   11/11/16 2330 11/12/16 0415  BP: (!) 103/56 (!) 91/47  Pulse: 78 81  Resp: 14 18  Temp: 98.4 F (36.9 C) 98.3 F (36.8 C)    Physical Exam: Cardiac:  regular Lungs:  Non labored Incisions:  Clean and dry Extremities:  Brisk doppler signals left DP/PT/peroneal; tenderness at foot; tenderness to palpation at below knee incision which is unchanged from post op visit yesterday.  Anterior compartment is non tender.   CBC    Component Value Date/Time   WBC 7.8 11/12/2016 0408   RBC 4.25 11/12/2016 0408   HGB 6.4 (LL) 11/12/2016 0408   HCT 23.9 (L) 11/12/2016 0408   PLT 298 11/12/2016 0408   MCV 56.2 (L) 11/12/2016 0408   MCH 15.1 (L) 11/12/2016 0408   MCHC 26.8 (L) 11/12/2016 0408   RDW 22.3 (H) 11/12/2016 0408   LYMPHSABS 2.2 12/25/2015 1833   MONOABS 0.5 12/25/2015 1833   EOSABS 0.2 12/25/2015 1833   BASOSABS 0.0 12/25/2015 1833    BMET    Component Value Date/Time   NA 135 11/12/2016 0408   K 3.9 11/12/2016 0408   CL 103 11/12/2016 0408   CO2 24 11/12/2016 0408   GLUCOSE 134 (H) 11/12/2016 0408   BUN 11 11/12/2016 0408   CREATININE 0.98 11/12/2016 0408   CREATININE 0.94 03/31/2015 1454   CALCIUM 8.2 (L) 11/12/2016 0408   GFRNONAA >60 11/12/2016 0408   GFRAA >60 11/12/2016 0408    INR    Component Value Date/Time   INR 1.13 11/09/2016 0856     Intake/Output Summary (Last 24 hours) at 11/12/16 0753 Last data filed at 11/12/16 0600  Gross per 24 hour  Intake          4346.67 ml  Output             1475 ml  Net          2871.67 ml     Assessment:  65 y.o. male is s/p:  1. Redo left common femoral artery exposure  2. Left femoral to peroneal artery bypass using 6 mm  PTFE graft (Propaten) 3. Intraoperative arteriogram   1 Day Post-Op  Plan: -brisk doppler signals left foot DP/PT/peroneal -pt with symptomatic acute surgical blood loss anemia.  Hgb 6.4 this am, which is down from 6.8 at 1900 last pm.  Two units of PRBC's ordered.  Will stop heparin for a couple of hours.  -DVT prophylaxis:  Heparin gtt -Dr. Bridgett Larsson does not suspect compartment syndrome.   Leontine Locket, PA-C [facility] [phone] 11/12/2016 7:53 AM   Addendum  I have independently interviewed and examined the patient, and I agree with the physician assistant's findings.  Pt dropping his BP from intraop blood loss in setting of preop anemia.  CBC Latest Ref Rng & Units 11/12/2016 11/11/2016 11/09/2016  WBC 4.0 - 10.5 K/uL 7.8 9.5 7.0  Hemoglobin 13.0 - 17.0 g/dL 6.4(LL) 6.8(LL) 7.8(L)  Hematocrit 39.0 - 52.0 % 23.9(L) 25.9(L) 28.5(L)  Platelets 150 - 400 K/uL 298 316 397   - ok to hold hep drip while transfusing 2 unit pRBC - will resume at 500 unit/hr tonight if H/H stable at post-transfusion check -  once pt HD stable and H/H stable, switch to Lovenox+warfarin as adjunct given the poor patencies of tibial bypasses constructed with PTFE (only option in this patient)   Adele Barthel, MD, [place] [facility] [place]: [phone] Pager: [phone]  11/12/2016, 10:03 AM

## 2016-11-12 NOTE — Progress Notes (Signed)
PT Cancellation Note  Patient Details Name: [name] MRN: [id number] DOB: 05-06-1951   Cancelled Treatment:    Reason Eval/Treat Not Completed: Patient not medically ready Pt Hgb 6.4, about to start a blood transfusion. Will follow up in PM as time allows.   [name] 11/12/2016, 8:36 AM Wray Kearns, PT, DPT [phone]

## 2016-11-12 NOTE — Progress Notes (Signed)
Dr Bridgett Larsson ordered to continue  hold heparin

## 2016-11-12 NOTE — Progress Notes (Signed)
On completion vitals signs for 1st unit of blood, patient had temp of 101.2.  Paged Dr. Bridgett Larsson, instructed to give patient Tylenol 650mg  and continue with 2nd unit of blood.   Tilda Burrow [place]

## 2016-11-13 LAB — HEPARIN LEVEL (UNFRACTIONATED): Heparin Unfractionated: <0.1 IU/mL — ABNORMAL LOW (ref 0.30–0.70)

## 2016-11-13 LAB — TYPE AND SCREEN
ABO/RH(D): A POS
Antibody Screen: NEGATIVE
Unit division: 0
Unit division: 0

## 2016-11-13 LAB — CBC
HCT: 25.5 % — ABNORMAL LOW (ref 39.0–52.0)
Hemoglobin: 7.3 g/dL — ABNORMAL LOW (ref 13.0–17.0)
MCH: 17.3 pg — ABNORMAL LOW (ref 26.0–34.0)
MCHC: 28.6 g/dL — ABNORMAL LOW (ref 30.0–36.0)
MCV: 60.3 fL — ABNORMAL LOW (ref 78.0–100.0)
Platelets: 234 10*3/uL (ref 150–400)
RBC: 4.23 MIL/uL (ref 4.22–5.81)
RDW: 26.5 % — ABNORMAL HIGH (ref 11.5–15.5)
WBC: 10.6 10*3/uL — ABNORMAL HIGH (ref 4.0–10.5)

## 2016-11-13 LAB — TROPONIN I
Troponin I: 0.05 ng/mL (ref <0.03)
Troponin I: <0.03 ng/mL (ref <0.03)

## 2016-11-13 LAB — APTT: aPTT: 40 seconds — ABNORMAL HIGH (ref 24–36)

## 2016-11-13 LAB — GLUCOSE, CAPILLARY
Glucose-Capillary: 116 mg/dL — ABNORMAL HIGH (ref 65–99)
Glucose-Capillary: 207 mg/dL — ABNORMAL HIGH (ref 65–99)
Glucose-Capillary: 147 mg/dL — ABNORMAL HIGH (ref 65–99)
Glucose-Capillary: 160 mg/dL — ABNORMAL HIGH (ref 65–99)

## 2016-11-13 LAB — HEMOGLOBIN A1C
Hgb A1c MFr Bld: 7.1 % — ABNORMAL HIGH (ref 4.8–5.6)
Mean Plasma Glucose: 157 mg/dL

## 2016-11-13 MED ORDER — DEXTROSE-NACL 5-0.45 % IV SOLN
INTRAVENOUS | Status: DC
  Administered 2016-11-13 – 2016-11-18 (×8): via INTRAVENOUS

## 2016-11-13 MED ORDER — SODIUM CHLORIDE 0.9 % IV BOLUS (SEPSIS)
1000.0000 mL | Freq: Once | INTRAVENOUS | Status: AC
  Administered 2016-11-13: 1000 mL via INTRAVENOUS

## 2016-11-13 NOTE — Progress Notes (Signed)
  Progress Note    11/13/2016 7:26 AM 2 Days Post-Op  Subjective:  Pain in foot is a little better.  Still sore on the bottom.  Tm 101.2 after 1st transfusion now afebrile 80's yesterday now up to [id number] systolic HR  [id number] NSR 99% RA  Vitals:   11/13/16 0021 11/13/16 0320  BP: (!) 93/50 (!) 102/54  Pulse: 77 76  Resp: 17 19  Temp: 98.6 F (37 C) 98.5 F (36.9 C)    Physical Exam: Cardiac:  regular Lungs:  Non labored Incisions:  Clean and dry Extremities:  Brisk doppler signal left DP/peroneal.   CBC    Component Value Date/Time   WBC 10.6 (H) 11/13/2016 0458   RBC 4.23 11/13/2016 0458   HGB 7.3 (L) 11/13/2016 0458   HCT 25.5 (L) 11/13/2016 0458   PLT 234 11/13/2016 0458   MCV 60.3 (L) 11/13/2016 0458   MCH 17.3 (L) 11/13/2016 0458   MCHC 28.6 (L) 11/13/2016 0458   RDW 26.5 (H) 11/13/2016 0458   LYMPHSABS 2.2 12/25/2015 1833   MONOABS 0.5 12/25/2015 1833   EOSABS 0.2 12/25/2015 1833   BASOSABS 0.0 12/25/2015 1833    BMET    Component Value Date/Time   NA 135 11/12/2016 0408   K 3.9 11/12/2016 0408   CL 103 11/12/2016 0408   CO2 24 11/12/2016 0408   GLUCOSE 134 (H) 11/12/2016 0408   BUN 11 11/12/2016 0408   CREATININE 0.98 11/12/2016 0408   CREATININE 0.94 03/31/2015 1454   CALCIUM 8.2 (L) 11/12/2016 0408   GFRNONAA >60 11/12/2016 0408   GFRAA >60 11/12/2016 0408    INR    Component Value Date/Time   INR 1.13 11/09/2016 0856     Intake/Output Summary (Last 24 hours) at 11/13/16 0726 Last data filed at 11/13/16 0600  Gross per 24 hour  Intake           2804.5 ml  Output              475 ml  Net           2329.5 ml     Assessment:  65 y.o. male is s/p:  1. Redo left common femoral artery exposure  2. Left femoral to peroneal artery bypass using 6 mm PTFE graft (Propaten) 3. Intraoperative arteriogram    2 Days Post-Op  Plan: -pt continues to have brisk DP/peroneal signals -acute surgical blood loss anemia s/p 2 units PRBC's.   Hgb improved from 6.4 to 7.3.  Will d/w Dr. Bridgett Larsson about starting heparin gtt back.  -continue to mobilize as tolerated.  Was OOB yesterday a couple of times after transfusion.  PT to work with pt. -BP improved after transfusion. -fever likely related to transfusion -keep in 3s   Leontine Locket, PA-C [facility] [phone] 11/13/2016 7:26 AM

## 2016-11-13 NOTE — Progress Notes (Signed)
   Daily Progress Note   Assessment/Planning: POD #2 s/p redo L fem-peroneal with PTFE   Oliguric likely due to extended period of hypotension  Inappropriate response to transfusion.  Given febrile reaction to 1st of 2 units, I am reluctant to give another unit.  Continue fluid for now.  Restart heparin at 500 u/hr.  If H/H stable tomorrow, Lovenox to Warfarin as adjunct for patency  ABI pending  Keep in stepdown ICU to watch for recurrent hypotension and see if his oliguria improves  Cardiac work-up non-diagnostic for acute event  Subjective  - 2 Days Post-Op  Walking a little but still tender  Objective Vitals:   11/12/16 1655 11/12/16 1950 11/13/16 0021 11/13/16 0320  BP: (!) 83/53 (!) 104/57 (!) 93/50 (!) 102/54  Pulse: 73 81 77 76  Resp: 18 (!) 25 17 19   Temp: 100.1 F (37.8 C) (!) 100.4 F (38 C) 98.6 F (37 C) 98.5 F (36.9 C)  TempSrc: Oral Oral Oral Oral  SpO2: 98% 97% 97% 97%  Weight:      Height:         Intake/Output Summary (Last 24 hours) at 11/13/16 0732 Last data filed at 11/13/16 0600  Gross per 24 hour  Intake           2804.5 ml  Output              475 ml  Net           2329.5 ml    PULM  CTAB CV  RRR GI  soft, NTND VASC  LLE inc c/d/i, fullness in L calf without any tightness, strong DP, peroneal and PT signals  Laboratory CBC    Component Value Date/Time   WBC 10.6 (H) 11/13/2016 0458   HGB 7.3 (L) 11/13/2016 0458   HCT 25.5 (L) 11/13/2016 0458   PLT 234 11/13/2016 0458    BMET    Component Value Date/Time   NA 135 11/12/2016 0408   K 3.9 11/12/2016 0408   CL 103 11/12/2016 0408   CO2 24 11/12/2016 0408   GLUCOSE 134 (H) 11/12/2016 0408   BUN 11 11/12/2016 0408   CREATININE 0.98 11/12/2016 0408   CREATININE 0.94 03/31/2015 1454   CALCIUM 8.2 (L) 11/12/2016 0408   GFRNONAA >60 11/12/2016 0408   GFRAA >60 11/12/2016 0408     Adele Barthel, MD, FACS [facility] Office:  [phone] Pager: [phone]  11/13/2016, 7:32 AM

## 2016-11-14 ENCOUNTER — Encounter (HOSPITAL_COMMUNITY): Payer: Self-pay | Admitting: Vascular Surgery

## 2016-11-14 ENCOUNTER — Inpatient Hospital Stay (HOSPITAL_COMMUNITY): Payer: Medicare Other

## 2016-11-14 DIAGNOSIS — I739 Peripheral vascular disease, unspecified: Secondary | ICD-10-CM

## 2016-11-14 LAB — BASIC METABOLIC PANEL
Anion gap: 7 (ref 5–15)
BUN: 12 mg/dL (ref 6–20)
CO2: 23 mmol/L (ref 22–32)
Calcium: 8.2 mg/dL — ABNORMAL LOW (ref 8.9–10.3)
Chloride: 105 mmol/L (ref 101–111)
Creatinine, Ser: 0.94 mg/dL (ref 0.61–1.24)
GFR calc Af Amer: >60 mL/min (ref >60)
GFR calc non Af Amer: >60 mL/min (ref >60)
Glucose, Bld: 143 mg/dL — ABNORMAL HIGH (ref 65–99)
Potassium: 3.9 mmol/L (ref 3.5–5.1)
Sodium: 135 mmol/L (ref 135–145)

## 2016-11-14 LAB — GLUCOSE, CAPILLARY
Glucose-Capillary: 108 mg/dL — ABNORMAL HIGH (ref 65–99)
Glucose-Capillary: 150 mg/dL — ABNORMAL HIGH (ref 65–99)
Glucose-Capillary: 150 mg/dL — ABNORMAL HIGH (ref 65–99)
Glucose-Capillary: 130 mg/dL — ABNORMAL HIGH (ref 65–99)

## 2016-11-14 LAB — CBC
HCT: 24.9 % — ABNORMAL LOW (ref 39.0–52.0)
Hemoglobin: 7 g/dL — ABNORMAL LOW (ref 13.0–17.0)
MCH: 17.2 pg — ABNORMAL LOW (ref 26.0–34.0)
MCHC: 28.1 g/dL — ABNORMAL LOW (ref 30.0–36.0)
MCV: 61 fL — ABNORMAL LOW (ref 78.0–100.0)
Platelets: 248 10*3/uL (ref 150–400)
RBC: 4.08 MIL/uL — ABNORMAL LOW (ref 4.22–5.81)
RDW: 27.3 % — ABNORMAL HIGH (ref 11.5–15.5)
WBC: 9.1 10*3/uL (ref 4.0–10.5)

## 2016-11-14 LAB — HEPARIN LEVEL (UNFRACTIONATED): Heparin Unfractionated: <0.1 IU/mL — ABNORMAL LOW (ref 0.30–0.70)

## 2016-11-14 LAB — APTT: aPTT: 41 seconds — ABNORMAL HIGH (ref 24–36)

## 2016-11-14 MED ORDER — METFORMIN HCL 500 MG PO TABS
500.0000 mg | ORAL_TABLET | Freq: Every day | ORAL | Status: DC
  Administered 2016-11-14 – 2016-11-20 (×7): 500 mg via ORAL
  Filled 2016-11-14 (×7): qty 1

## 2016-11-14 MED ORDER — ENOXAPARIN SODIUM 40 MG/0.4ML ~~LOC~~ SOLN
40.0000 mg | SUBCUTANEOUS | Status: DC
  Administered 2016-11-14 – 2016-11-20 (×7): 40 mg via SUBCUTANEOUS
  Filled 2016-11-14 (×7): qty 0.4

## 2016-11-14 MED ORDER — FERROUS SULFATE 325 (65 FE) MG PO TABS
325.0000 mg | ORAL_TABLET | Freq: Three times a day (TID) | ORAL | Status: DC
  Administered 2016-11-14 – 2016-11-20 (×18): 325 mg via ORAL
  Filled 2016-11-14 (×19): qty 1

## 2016-11-14 MED ORDER — DOCUSATE SODIUM 100 MG PO CAPS: 100.0000 mg | ORAL_CAPSULE | Freq: Two times a day (BID) | ORAL | Status: DC | PRN

## 2016-11-14 MED ORDER — CLOPIDOGREL BISULFATE 75 MG PO TABS
75.0000 mg | ORAL_TABLET | Freq: Every day | ORAL | Status: DC
  Administered 2016-11-14 – 2016-11-20 (×7): 75 mg via ORAL
  Filled 2016-11-14 (×7): qty 1

## 2016-11-14 NOTE — Progress Notes (Signed)
Physical Therapy Treatment Patient Details Name: [name] MRN: [id number] DOB: 1951-09-03 Today's Date: 11/14/2016    History of Present Illness Patient is a 65 y/o male with hx of left common fem below knee pop artery bypass, chronic low back pain, anemia, PAD, HTN, HLD presents s/p Redo left common femoral artery exposure and Left femoral to peroneal artery bypass graft. post op blood loss now s/p 1 of 3 units of blood.    PT Comments    Pt needed encouragement, but was able to ambulate a out into hallway this session, but indicates pain in L LE limits further distance.  Feel pt will need continued therapies to increase mobility and safety prior to returning to home.  Will continue to follow.    Follow Up Recommendations  Home health PT;Supervision for mobility/OOB;Supervision/Assistance - 24 hour     Equipment Recommendations  None recommended by PT    Recommendations for Other Services       Precautions / Restrictions Precautions Precautions: Fall Restrictions Weight Bearing Restrictions: No    Mobility  Bed Mobility Overal bed mobility: Needs Assistance Bed Mobility: Supine to Sit;Sit to Supine     Supine to sit: Min guard;HOB elevated Sit to supine: Min assist   General bed mobility comments: pt insists on elevating HOB for coming to sitting, so no physical A needed.  pt needed A with L LE with return to bed.    Transfers Overall transfer level: Needs assistance Equipment used: Rolling walker (2 wheeled) Transfers: Sit to/from Stand Sit to Stand: Min assist         General transfer comment: cues for UE use and getting closer to bed prior to sitting.    Ambulation/Gait Ambulation/Gait assistance: Min guard Ambulation Distance (Feet): 30 Feet Assistive device: Rolling walker (2 wheeled) Gait Pattern/deviations: Step-through pattern;Decreased stance time - left;Decreased stride length;Decreased dorsiflexion - left;Trunk flexed     General Gait  Details: pt needs cues for increased weightbearing on L LE and increased heel strike.  Encouragement for increasing distance.     Stairs            Wheelchair Mobility    Modified Rankin (Stroke Patients Only)       Balance Overall balance assessment: Needs assistance Sitting-balance support: No upper extremity supported;Feet supported Sitting balance-Leahy Scale: Fair     Standing balance support: Bilateral upper extremity supported;During functional activity Standing balance-Leahy Scale: Poor                      Cognition Arousal/Alertness: Awake/alert Behavior During Therapy: WFL for tasks assessed/performed Overall Cognitive Status: Within Functional Limits for tasks assessed                      Exercises      General Comments        Pertinent Vitals/Pain Pain Assessment: 0-10 Pain Score: 5  Pain Location: L LE during mobility Pain Descriptors / Indicators: Grimacing;Tightness Pain Intervention(s): Premedicated before session;Repositioned;Patient requesting pain meds-RN notified    Home Living                      Prior Function            PT Goals (current goals can now be found in the care plan section) Acute Rehab PT Goals Patient Stated Goal: Pain to go away PT Goal Formulation: With patient Time For Goal Achievement: 11/26/16 Potential to Achieve Goals: Good Progress  towards PT goals: Progressing toward goals    Frequency    Min 3X/week      PT Plan Current plan remains appropriate    Co-evaluation             End of Session Equipment Utilized During Treatment: Gait belt Activity Tolerance: Patient limited by pain Patient left: in bed;with call bell/phone within reach     Time: 1008-1029 PT Time Calculation (min) (ACUTE ONLY): 21 min  Charges:  $Gait Training: 8-22 mins                    G CodesCatarina Hartshorn, [place] [phone] 11/14/2016, 2:07 PM

## 2016-11-14 NOTE — Progress Notes (Addendum)
OT Evaluation  PTA, pt was mod I with mobility and ADL. Pt states he has not been able to do things he loves for the past year due to pain and limitations with mobility. Will follow pt acutely for education on use of AE and compensatory techniqeus to increase independence with ADL and functional mobility.   11/14/16 1430  OT Visit Information  Last OT Received On 11/14/16  Assistance Needed +1  History of Present Illness Patient is a 65 y/o male with hx of left common fem below knee pop artery bypass, chronic low back pain, anemia, PAD, HTN, HLD presents s/p Redo left common femoral artery exposure and Left femoral to peroneal artery bypass graft. post op blood loss now s/p 1 of 3 units of blood.  Precautions  Precautions Fall  Home Living  Family/patient expects to be discharged to: Private residence  Living Arrangements Spouse/significant other  Available Help at Discharge Family;Available PRN/intermittently;Friend(s)  Type of [place] entrance  Home Layout One level  Bathroom Shower/Tub Tub/shower unit  Shower/tub characteristics Curtain  Corporate treasurer Yes  How Accessible Accessible via Astronomer seat;Other (comment);Walker - 2 wheels  Prior Function  Level of Independence Independent with assistive device(s)  Comments PTA used walking stick at all times  Communication  Communication No difficulties  Pain Assessment  Pain Assessment 0-10  Pain Score 8  Pain Location LLE  Pain Descriptors / Indicators Burning  Pain Intervention(s) Limited activity within patient's tolerance;Repositioned  Cognition  Arousal/Alertness Awake/alert  Behavior During Therapy WFL for tasks assessed/performed  Overall Cognitive Status Within Functional Limits for tasks assessed  Upper Extremity Assessment  Upper Extremity Assessment Overall WFL for tasks assessed  Lower Extremity Assessment  Lower Extremity Assessment  Defer to PT evaluation  Cervical / Trunk Assessment  Cervical / Trunk Assessment Normal  ADL  Overall ADL's  Needs assistance/impaired  Grooming Set up  Upper Body Bathing Set up  Lower Body Bathing Moderate assistance;Sit to/from stand  Upper Body Dressing  Set up  Lower Body Dressing Moderate assistance;Sit to/from Retail buyer Ambulation;Comfort height toilet;RW;Min guard  Toileting- Clothing Manipulation and Hygiene Supervision/safety  Functional mobility during ADLs Min guard;Rolling walker  General ADL Comments LB ADL are limited by pain  Bed Mobility  Overal bed mobility Needs Assistance  Bed Mobility Supine to Sit;Sit to Supine  Supine to sit Supervision  Sit to supine Supervision  General bed mobility comments INCREAED TIME  Transfers  Overall transfer level Needs assistance  Equipment used Rolling walker (2 wheeled)  Transfers Sit to/from Stand  Sit to Stand Min guard  Balance  Overall balance assessment Needs assistance  Standing balance-Leahy Scale Fair  OT - End of Session  Equipment Utilized During Treatment Rolling walker  Activity Tolerance Patient tolerated treatment well  Patient left in bed;with call bell/phone within reach;with bed alarm set  Nurse Communication Mobility status  OT Assessment  OT Recommendation/Assessment Patient needs continued OT Services  OT Problem List Decreased strength;Decreased range of motion;Decreased activity tolerance;Impaired balance (sitting and/or standing);Decreased safety awareness;Decreased knowledge of use of DME or AE;Impaired sensation;Obesity;Pain;Increased edema  OT Plan  OT Frequency (ACUTE ONLY) Min 2X/week  OT Treatment/Interventions (ACUTE ONLY) Self-care/ADL training;Therapeutic exercise;Energy conservation;DME and/or AE instruction;Therapeutic activities;Patient/family education;Balance training  OT Recommendation  Follow Up Recommendations Home health OT;Supervision - Intermittent  OT Equipment 3 in  1 bedside comode  Individuals Consulted  Consulted and Agree with Results and Recommendations  Patient  Acute Rehab OT Goals  Patient Stated Goal Pain to go away  OT Goal Formulation With patient  Time For Goal Achievement 11/28/16  Potential to Achieve Goals Good  OT Time Calculation  OT Start Time (ACUTE ONLY) 1411  OT Stop Time (ACUTE ONLY) 1431  OT Time Calculation (min) 20 min  OT General Charges  $OT Visit 1 Procedure  OT Evaluation  $OT Eval Moderate Complexity 1 Procedure  OT Treatments     Written Expression  Dominant Hand Right  Maurie Boettcher, OTR/L  [phone] 11/14/2016

## 2016-11-14 NOTE — Progress Notes (Signed)
Patient has arrived on unit from [place] Patient oriented to unit, assessed, placed on tele, VS were stable.

## 2016-11-14 NOTE — Care Management Note (Signed)
Case Management Note  Patient Details  Name: AZLAN AUGUSTO MRN: [id number] Date of Birth: [birth year]  Subjective/Objective:   S/p fem pop, patient states he will be going to stay with his wife at discharge, and his brother will transport him from hospital at discharge , Patient is already pre set up with [facility] for [facility] [place], [place].  Per [name] .  NCM spoke with patient and he said this was fine for him.  NCM will cont to follow for dc needs.                 Action/Plan:   Expected Discharge Date:                  Expected Discharge Plan:  [place]  In-House Referral:     Discharge planning Services  CM Consult  Post Acute Care Choice:  Home Health Choice offered to:     DME Arranged:    DME Agency:     HH Arranged:  PT, RN, OT HH Agency:  [facility]  Status of Service:  Completed, signed off  If discussed at [place] of Stay Meetings, dates discussed:    Additional Comments:  Zenon Mayo, RN 11/14/2016, 10:54 AM

## 2016-11-14 NOTE — Progress Notes (Signed)
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 Unable to obtain left sided pressures due to surgical bandages, and patient pain tolerance. All left sided lower extremity arteries demonstrate monophasic flow. Left TBI of 0.24 is suggestive of abnormal arterial flow at rest.   RIGHT    LEFT    PRESSURE WAVEFORM  PRESSURE WAVEFORM  BRACHIAL 135 Triphasic BRACHIAL 128 Triphasic  DP 62 Monophasic DP  Monophasic  PT 67 Monophasic PT  Monophasic  GREAT TOE 22 NA GREAT TOE 33 NA    RIGHT LEFT  ABI 0.5 TBI 0.24     Legrand Como, RVT 11/14/2016, 1:38 PM

## 2016-11-14 NOTE — Progress Notes (Addendum)
Progress Note 11/14/2016 7:18 AM 3 Days Post-Op  Subjective:  Says the pain in his foot is still present, but continues to get better.  He did walk with a walker across the room yesterday.  He is only able to bear weight on the ball of his foot.  Says he is keeping his groin incision clean with a gauze.  Denies any dizziness.  Tm 99.6 now afebrile HR 60's-80's NSR [id number] systolic [id number] RA  Vitals:   11/13/16 2341 11/14/16 0255  BP: (!) 105/58 (!) 94/53  Pulse: 71 73  Resp: 17 18  Temp: 98.4 F (36.9 C) 98.6 F (37 C)    Physical Exam: Cardiac:  regular Lungs:  Non labored Incisions:  Below knee incision with some serosanguinous drainage from the proximal portion of the incision.  The left groin incision is clean and dry.  A drop of bloody drainage from the distal ankle incision, otherwise, it is clean and dry. Extremities:  Brisk doppler signals left DP/PT/peroneal; There is swelling in the LLE;    CBC    Component Value Date/Time   WBC 9.1 11/14/2016 0353   RBC 4.08 (L) 11/14/2016 0353   HGB 7.0 (L) 11/14/2016 0353   HCT 24.9 (L) 11/14/2016 0353   PLT 248 11/14/2016 0353   MCV 61.0 (L) 11/14/2016 0353   MCH 17.2 (L) 11/14/2016 0353   MCHC 28.1 (L) 11/14/2016 0353   RDW 27.3 (H) 11/14/2016 0353   LYMPHSABS 2.2 12/25/2015 1833   MONOABS 0.5 12/25/2015 1833   EOSABS 0.2 12/25/2015 1833   BASOSABS 0.0 12/25/2015 1833    BMET    Component Value Date/Time   NA 135 11/14/2016 0353   K 3.9 11/14/2016 0353   CL 105 11/14/2016 0353   CO2 23 11/14/2016 0353   GLUCOSE 143 (H) 11/14/2016 0353   BUN 12 11/14/2016 0353   CREATININE 0.94 11/14/2016 0353   CREATININE 0.94 03/31/2015 1454   CALCIUM 8.2 (L) 11/14/2016 0353   GFRNONAA >60 11/14/2016 0353   GFRAA >60 11/14/2016 0353    INR    Component Value Date/Time   INR 1.13 11/09/2016 0856     Intake/Output Summary (Last 24 hours) at 11/14/16 0718 Last data filed at 11/14/16 0600  Gross per 24 hour    Intake          2925.25 ml  Output             1450 ml  Net          1475.25 ml     Assessment:  65 y.o. male is s/p:  1. Redo left common femoral artery exposure  2. Left femoral to peroneal artery bypass using 6 mm PTFE graft (Propaten) 3. Intraoperative arteriogram   3 Days Post-Op  Plan: -pt continues to have brisk doppler flow to the left foot -heparin restarted yesterday afternoon at 500U/hr.  His hgb is down slightly, but stable.   -Plan was to start coumadin post surgery for graft patency, however, with his lymphatic drainage from the below knee incision, Dr. Scot Dock has decided not to do this and resume his Plavix.  Will d/c heparin as well.  Will start Lovenox 40mg  daily for DVT prophylaxis.  -he does have some swelling in his left leg-a gentle compression ace wrap was placed and will do leg elevation when he is in the bed.   -UOP is improved.  His creatinine remains normal after arteriogram. -Will restart Metformin today as it has been 3 days since  his intraoperative arteriogram. -ck labs in am -keep in 3s today and most likely transfer tomorrow as BP is a little soft.  Pt is asymptomatic.  Leontine Locket, PA-C [facility] [phone] 11/14/2016 7:18 AM  I have interviewed the patient and examined the patient. I agree with the findings by the PA. Elevate legs.  Anemia: had a reaction to blood transfusion. Will start FeSO4 and colace. I do not think that he has bleeding in his leg. Will guiac stool. He came into the hospital anemic (Hgb = 7.8).  Transfer to 2W.   Gae Gallop, MD [phone]

## 2016-11-15 ENCOUNTER — Other Ambulatory Visit: Payer: Self-pay | Admitting: *Deleted

## 2016-11-15 DIAGNOSIS — I10 Essential (primary) hypertension: Secondary | ICD-10-CM

## 2016-11-15 LAB — GLUCOSE, CAPILLARY
Glucose-Capillary: 129 mg/dL — ABNORMAL HIGH (ref 65–99)
Glucose-Capillary: 118 mg/dL — ABNORMAL HIGH (ref 65–99)
Glucose-Capillary: 121 mg/dL — ABNORMAL HIGH (ref 65–99)
Glucose-Capillary: 99 mg/dL (ref 65–99)

## 2016-11-15 LAB — CBC
HCT: 23.6 % — ABNORMAL LOW (ref 39.0–52.0)
Hemoglobin: 6.7 g/dL — CL (ref 13.0–17.0)
MCH: 17.3 pg — ABNORMAL LOW (ref 26.0–34.0)
MCHC: 28.4 g/dL — ABNORMAL LOW (ref 30.0–36.0)
MCV: 61 fL — ABNORMAL LOW (ref 78.0–100.0)
Platelets: 248 10*3/uL (ref 150–400)
RBC: 3.87 MIL/uL — ABNORMAL LOW (ref 4.22–5.81)
RDW: 28.4 % — ABNORMAL HIGH (ref 11.5–15.5)
WBC: 7.3 10*3/uL (ref 4.0–10.5)

## 2016-11-15 LAB — HEPARIN LEVEL (UNFRACTIONATED): Heparin Unfractionated: <0.1 IU/mL — ABNORMAL LOW (ref 0.30–0.70)

## 2016-11-15 MED ORDER — PANTOPRAZOLE SODIUM 40 MG PO TBEC: 40.0000 mg | DELAYED_RELEASE_TABLET | Freq: Every day | ORAL | 11 refills | Status: DC

## 2016-11-15 MED ORDER — BENAZEPRIL HCL 5 MG PO TABS: 5.0000 mg | ORAL_TABLET | Freq: Every day | ORAL | 3 refills | Status: DC

## 2016-11-15 MED ORDER — CILOSTAZOL 100 MG PO TABS: 100.0000 mg | ORAL_TABLET | Freq: Two times a day (BID) | ORAL | 11 refills | Status: DC

## 2016-11-15 MED ORDER — GABAPENTIN 800 MG PO TABS: ORAL_TABLET | ORAL | 11 refills | Status: DC

## 2016-11-15 NOTE — Care Management Important Message (Signed)
Important Message  Patient Details  Name: [name] MRN: [id number] Date of Birth: 1951/03/20   Medicare Important Message Given:  Yes    Neola Worrall Abena 11/15/2016, 10:09 AM

## 2016-11-15 NOTE — Progress Notes (Addendum)
Progress Note  11/15/2016 7:24 AM 4 Days Post-Op  Subjective:  Pain is improving; pt states they took the ace wrap off yesterday when he went down for his study and didn't put it back on and made him mad.   Afebrile HR  [id number] systolic (improved from 0000000 yesterday) 99% RA  Vitals:   11/14/16 2212 11/15/16 0531  BP: 129/76 121/70  Pulse: 81 74  Resp: 18 18  Temp: 98 F (36.7 C) 98.2 F (36.8 C)    Physical Exam: Cardiac:  regular Lungs:  Non labored  Incisions:  Left below the knee incision is clean as well as the left groin incision Extremities:  Swelling is a little better this morning.  Left foot is warm and well perfused.  Pain improving with movement   CBC    Component Value Date/Time   WBC 7.3 11/15/2016 0240   RBC 3.87 (L) 11/15/2016 0240   HGB 6.7 (LL) 11/15/2016 0240   HCT 23.6 (L) 11/15/2016 0240   PLT 248 11/15/2016 0240   MCV 61.0 (L) 11/15/2016 0240   MCH 17.3 (L) 11/15/2016 0240   MCHC 28.4 (L) 11/15/2016 0240   RDW 28.4 (H) 11/15/2016 0240   LYMPHSABS 2.2 12/25/2015 1833   MONOABS 0.5 12/25/2015 1833   EOSABS 0.2 12/25/2015 1833   BASOSABS 0.0 12/25/2015 1833    BMET    Component Value Date/Time   NA 135 11/14/2016 0353   K 3.9 11/14/2016 0353   CL 105 11/14/2016 0353   CO2 23 11/14/2016 0353   GLUCOSE 143 (H) 11/14/2016 0353   BUN 12 11/14/2016 0353   CREATININE 0.94 11/14/2016 0353   CREATININE 0.94 03/31/2015 1454   CALCIUM 8.2 (L) 11/14/2016 0353   GFRNONAA >60 11/14/2016 0353   GFRAA >60 11/14/2016 0353    INR    Component Value Date/Time   INR 1.13 11/09/2016 0856     Intake/Output Summary (Last 24 hours) at 11/15/16 0724 Last data filed at 11/15/16 0326  Gross per 24 hour  Intake          1074.83 ml  Output             1975 ml  Net          -900.17 ml     Assessment:  65 y.o. male is s/p:  1. Redo left common femoral artery exposure  2. Left femoral to peroneal artery bypass using 6 mm PTFE graft  (Propaten) 3. Intraoperative arteriogram   4 Days Post-Op  Plan: -pt doing well this morning.  His left foot is warm and well perfused and pain is improving daily.  -acute surgical blood loss anemia-Hgb down slightly from yesterday at 6.7, however, pt's systolic BP is improved, O2 sats are 99% on RA and he is asymptomatic.  Will hold off on transfusion at this time - stool guaiac is ordered but no results -swelling in the left leg is also improved this am.  Left leg re-wrapped with gentle compression with ace wrap.  Continue leg elevation. -DVT prophylaxis:  Lovenox. -continue mobilization  Leontine Locket, PA-C [facility] [phone] 11/15/2016 7:24 AM  I have interviewed the patient and examined the patient. I agree with the findings by the PA. His graft is patent. Given his chronic anemia and significant risk for wound healing problems and lymphocele, I decided not to use Coumadin. He can continue his Plavix.  Should be ready for discharge tomorrow on iron and Colace.   Gae Gallop,  MD [phone]

## 2016-11-15 NOTE — Consult Note (Signed)
[facility] CM Primary Care Navigator  11/15/2016  [name] 07/22/1951 [id number]      Met with patient at the bedside to identify possible discharge needs. Patient reports having poor circulation to left lower leg with noted swelling, pain and difficulty walking that had led to this admission/ surgery.  Patient endorses Dr. Ian [name] with [facility] as the primary care provider.   Patient shared using [facility] at [place] to obtain medications without any problem. Patient manages his medications at home using "pill box" system.   Patient's wife ([name]) provides transportation to his doctors' appointments and she is the primary caregiver at home as stated by patient.    Plan for discharge is home with home health services.   Patient voiced understanding to call primary care provider's office once discharged home, for a post discharge follow-up appointment within a week or sooner if needs arise.  Patient denies any care coordination needs and disease management (related to diabetes and HTN) at this time. [facility] care management contact information provided for future needs that may arise.    For additional questions please contact:  [name], BSN, RN-BC [facility] PRIMARY CARE Navigator Cell: [phone] 

## 2016-11-15 NOTE — Telephone Encounter (Signed)
Refill request for 90 day supply.  Martin, Tamika L, RN  

## 2016-11-15 NOTE — Progress Notes (Signed)
CRITICAL VALUE ALERT  Critical value received:  Hgb 6.7  Date of notification:  11/15/2016  Time of notification:  03:30  Critical value read back: Yes  Nurse who received alert:  Jairo Ben, RN  MD notified (1st page):  Dr. Donzetta Matters  Time of first page:  03:40  MD notified (2nd page):  Time of second page:  Responding MD:  Dr. Donzetta Matters  Time MD responded:  03:50

## 2016-11-16 ENCOUNTER — Other Ambulatory Visit: Payer: Self-pay | Admitting: *Deleted

## 2016-11-16 ENCOUNTER — Ambulatory Visit: Payer: Medicare Other | Admitting: Vascular Surgery

## 2016-11-16 DIAGNOSIS — I10 Essential (primary) hypertension: Secondary | ICD-10-CM

## 2016-11-16 LAB — CBC
HCT: 24.4 % — ABNORMAL LOW (ref 39.0–52.0)
HCT: 29.5 % — ABNORMAL LOW (ref 39.0–52.0)
Hemoglobin: 6.9 g/dL — CL (ref 13.0–17.0)
Hemoglobin: 8.8 g/dL — ABNORMAL LOW (ref 13.0–17.0)
MCH: 17.4 pg — ABNORMAL LOW (ref 26.0–34.0)
MCH: 19.3 pg — ABNORMAL LOW (ref 26.0–34.0)
MCHC: 28.3 g/dL — ABNORMAL LOW (ref 30.0–36.0)
MCHC: 29.8 g/dL — ABNORMAL LOW (ref 30.0–36.0)
MCV: 61.6 fL — ABNORMAL LOW (ref 78.0–100.0)
MCV: 64.6 fL — ABNORMAL LOW (ref 78.0–100.0)
Platelets: 247 10*3/uL (ref 150–400)
Platelets: 229 10*3/uL (ref 150–400)
RBC: 3.96 MIL/uL — ABNORMAL LOW (ref 4.22–5.81)
RBC: 4.57 MIL/uL (ref 4.22–5.81)
RDW: 29.2 % — ABNORMAL HIGH (ref 11.5–15.5)
RDW: 30 % — ABNORMAL HIGH (ref 11.5–15.5)
WBC: 8.4 10*3/uL (ref 4.0–10.5)
WBC: 7.9 10*3/uL (ref 4.0–10.5)

## 2016-11-16 LAB — PREPARE RBC (CROSSMATCH)

## 2016-11-16 LAB — GLUCOSE, CAPILLARY
Glucose-Capillary: 123 mg/dL — ABNORMAL HIGH (ref 65–99)
Glucose-Capillary: 112 mg/dL — ABNORMAL HIGH (ref 65–99)
Glucose-Capillary: 197 mg/dL — ABNORMAL HIGH (ref 65–99)
Glucose-Capillary: 179 mg/dL — ABNORMAL HIGH (ref 65–99)

## 2016-11-16 MED ORDER — PANTOPRAZOLE SODIUM 40 MG PO TBEC: 40.0000 mg | DELAYED_RELEASE_TABLET | Freq: Every day | ORAL | 11 refills | Status: DC

## 2016-11-16 MED ORDER — FAMOTIDINE IN NACL 20-0.9 MG/50ML-% IV SOLN
20.0000 mg | Freq: Once | INTRAVENOUS | Status: AC
  Administered 2016-11-16: 20 mg via INTRAVENOUS
  Filled 2016-11-16: qty 50

## 2016-11-16 MED ORDER — SODIUM CHLORIDE 0.9 % IV SOLN
Freq: Once | INTRAVENOUS | Status: AC
  Administered 2016-11-16: 08:00:00 via INTRAVENOUS

## 2016-11-16 MED ORDER — METHYLPREDNISOLONE SODIUM SUCC 125 MG IJ SOLR
60.0000 mg | Freq: Once | INTRAMUSCULAR | Status: AC
  Administered 2016-11-16: 60 mg via INTRAVENOUS
  Filled 2016-11-16: qty 2

## 2016-11-16 MED ORDER — BENAZEPRIL HCL 5 MG PO TABS: 5.0000 mg | ORAL_TABLET | Freq: Every day | ORAL | 3 refills | Status: DC

## 2016-11-16 MED ORDER — GABAPENTIN 800 MG PO TABS: ORAL_TABLET | ORAL | 11 refills | Status: DC

## 2016-11-16 MED ORDER — DIPHENHYDRAMINE HCL 25 MG PO CAPS: 25.0000 mg | ORAL_CAPSULE | Freq: Four times a day (QID) | ORAL | Status: DC | PRN

## 2016-11-16 MED ORDER — CILOSTAZOL 100 MG PO TABS: 100.0000 mg | ORAL_TABLET | Freq: Two times a day (BID) | ORAL | 11 refills | Status: DC

## 2016-11-16 NOTE — Progress Notes (Signed)
Physical Therapy Treatment Patient Details Name: [name] MRN: [id number] DOB: 11/25/1951 Today's Date: 11/16/2016    History of Present Illness Patient is a 65 y/o male with hx of left common fem below knee pop artery bypass, chronic low back pain, anemia, PAD, HTN, HLD presents s/p Redo left common femoral artery exposure and Left femoral to peroneal artery bypass graft. post op blood loss now s/p 1 of 3 units of blood.    PT Comments    Patient with increased ambulation distance but overall remains limited in activity tolerance. Max multimodal cues for gait improvements with limited carry over. Educated patient [id number] control and elevation. Will continue current POC.  Follow Up Recommendations  Home health PT;Supervision for mobility/OOB;Supervision/Assistance - 24 hour     Equipment Recommendations  None recommended by PT    Recommendations for Other Services OT consult     Precautions / Restrictions Precautions Precautions: Fall Restrictions Weight Bearing Restrictions: No    Mobility  Bed Mobility               General bed mobility comments: Received OOB  Transfers Overall transfer level: Needs assistance Equipment used: Rolling walker (2 wheeled) Transfers: Sit to/from Stand Sit to Stand: Min guard         General transfer comment: No physical assist required. Increased time to perform with heavy reliance on UE support  Ambulation/Gait Ambulation/Gait assistance: Min guard Ambulation Distance (Feet): 70 Feet Assistive device: Rolling walker (2 wheeled) Gait Pattern/deviations: Step-to pattern;Decreased stride length;Decreased stance time - left;Antalgic;Trunk flexed Gait velocity: decreased Gait velocity interpretation: Below normal speed for age/gender General Gait Details: patient required 3 standing rest breaks. Increased time to perform. VCs for upright posture. Heavy reliance on RW   Stairs            Wheelchair Mobility     Modified Rankin (Stroke Patients Only)       Balance   Sitting-balance support: No upper extremity supported Sitting balance-Leahy Scale: Fair     Standing balance support: Bilateral upper extremity supported Standing balance-Leahy Scale: Fair Standing balance comment: able to perform functional tasks at sink without UE support, limiting weight shift on LLE                    Cognition Arousal/Alertness: Awake/alert Behavior During Therapy: WFL for tasks assessed/performed Overall Cognitive Status: Within Functional Limits for tasks assessed                      Exercises      General Comments General comments (skin integrity, edema, etc.): educated patient re: elevation for edema control      Pertinent Vitals/Pain Pain Assessment: 0-10 Pain Score: 9  Pain Location: LLE incision and bilateral calf pain Pain Descriptors / Indicators: Burning Pain Intervention(s): Monitored during session    Home Living                      Prior Function            PT Goals (current goals can now be found in the care plan section) Acute Rehab PT Goals Patient Stated Goal: Pain to go away PT Goal Formulation: With patient Time For Goal Achievement: 11/26/16 Potential to Achieve Goals: Good Progress towards PT goals: Progressing toward goals    Frequency    Min 3X/week      PT Plan Current plan remains appropriate    Co-evaluation  End of Session Equipment Utilized During Treatment: Gait belt Activity Tolerance: Patient limited by pain Patient left: in chair;with call bell/phone within reach;with nursing/sitter in room     Time: 1010-1032 PT Time Calculation (min) (ACUTE ONLY): 22 min  Charges:  $Gait Training: 8-22 mins                    G Codes:      Duncan Dull 11-24-2016, 10:56 AM Alben Deeds, PT DPT  [phone]

## 2016-11-16 NOTE — Telephone Encounter (Signed)
Refill request for 90 day supply.  Allean Montfort L, RN  

## 2016-11-16 NOTE — Progress Notes (Signed)
OT Cancellation Note  Patient Details Name: [name] MRN: [id number] DOB: 03/04/1951   Cancelled Treatment:    Reason Eval/Treat Not Completed: Medical issues which prohibited therapy (hgb 6.9 and beginning blood transfusion). RN requesting hold on therapy at this time to monitor for reaction to transfusion. Will follow up as time allows.  [name] M.S., OTR/L Pager: [phone]  11/16/2016, 4:33 PM

## 2016-11-16 NOTE — Progress Notes (Signed)
   VASCULAR SURGERY ASSESSMENT & PLAN:  5 Days Post-Op s/p: Left peroneal bypass with PTFE. Graft is patent. Good flow left foot (DP, Per, PT).  Incisions look good.  Left leg swelling unchanged.   Anemia: His Hgb was 7.8 preop. It has drifted down. Will try to transfuse again today. This weekend he had a rxn to transfusion. Will premedicate.   Home when able to ambulate better.   SUBJECTIVE: Says that he can't ambulate far.   PHYSICAL EXAM: Vitals:   11/15/16 0531 11/15/16 1500 11/15/16 2038 11/16/16 0500  BP: 121/70 124/71 126/71 126/66  Pulse: 74 80 81 (!) 57  Resp: 18 20 20 20   Temp: 98.2 F (36.8 C)  98.3 F (36.8 C) 98.2 F (36.8 C)  TempSrc: Oral  Oral Oral  SpO2: 99% 98% 98% 99%  Weight:      Height:       Brisk doppler flow left foot Per, DP, and PT.   LABS: Lab Results  Component Value Date   WBC 8.4 11/16/2016   HGB 6.9 (LL) 11/16/2016   HCT 24.4 (L) 11/16/2016   MCV 61.6 (L) 11/16/2016   PLT 247 11/16/2016   Lab Results  Component Value Date   CREATININE 0.94 11/14/2016   Lab Results  Component Value Date   INR 1.13 11/09/2016   CBG (last 3)   Recent Labs  11/15/16 1646 11/15/16 2153 11/16/16 0541  GLUCAP 121* 99 123*    Active Problems:   PAD (peripheral artery disease) ([place])  Gae Gallop [id number] 11/16/2016

## 2016-11-17 LAB — CBC
HCT: 28.8 % — ABNORMAL LOW (ref 39.0–52.0)
Hemoglobin: 8.6 g/dL — ABNORMAL LOW (ref 13.0–17.0)
MCH: 19.2 pg — ABNORMAL LOW (ref 26.0–34.0)
MCHC: 29.9 g/dL — ABNORMAL LOW (ref 30.0–36.0)
MCV: 64.1 fL — ABNORMAL LOW (ref 78.0–100.0)
Platelets: 231 10*3/uL (ref 150–400)
RBC: 4.49 MIL/uL (ref 4.22–5.81)
RDW: 29.8 % — ABNORMAL HIGH (ref 11.5–15.5)
WBC: 8.1 10*3/uL (ref 4.0–10.5)

## 2016-11-17 LAB — TYPE AND SCREEN
ABO/RH(D): A POS
Antibody Screen: NEGATIVE
Unit division: 0
Unit division: 0

## 2016-11-17 LAB — GLUCOSE, CAPILLARY
Glucose-Capillary: 128 mg/dL — ABNORMAL HIGH (ref 65–99)
Glucose-Capillary: 158 mg/dL — ABNORMAL HIGH (ref 65–99)
Glucose-Capillary: 147 mg/dL — ABNORMAL HIGH (ref 65–99)
Glucose-Capillary: 170 mg/dL — ABNORMAL HIGH (ref 65–99)

## 2016-11-17 NOTE — Progress Notes (Signed)
  [facility] Progress Note  Subjective  - POD #6  Left leg is painful. Wants to go home tomorrow.   Objective Vitals:   11/16/16 2000 11/17/16 0606  BP: 123/70 (!) 149/66  Pulse: 74 76  Resp: 18 18  Temp: 98.4 F (36.9 C) 98 F (36.7 C)    Intake/Output Summary (Last 24 hours) at 11/17/16 0921 Last data filed at 11/16/16 1930  Gross per 24 hour  Intake             1150 ml  Output              500 ml  Net              650 ml   Left groin incision and left leg incisions clean. Left leg swelling.  Good doppler flow left DP, peroneal and posterior tibial  Assessment/Planning: 65 y.o. male is s/p: left peroneal bypass with PTFE 6 Days Post-Op   Anemia: Hgb improved to 8.6 after 2 units of pRBCs yesterday.  Bypass graft is patent.  Mobilize today.  Plan for d/c home tomorrow.  [facility] services set up.   Alvia Grove 11/17/2016 9:21 AM --  Laboratory CBC    Component Value Date/Time   WBC 8.1 11/17/2016 0246   HGB 8.6 (L) 11/17/2016 0246   HCT 28.8 (L) 11/17/2016 0246   PLT 231 11/17/2016 0246    BMET    Component Value Date/Time   NA 135 11/14/2016 0353   K 3.9 11/14/2016 0353   CL 105 11/14/2016 0353   CO2 23 11/14/2016 0353   GLUCOSE 143 (H) 11/14/2016 0353   BUN 12 11/14/2016 0353   CREATININE 0.94 11/14/2016 0353   CREATININE 0.94 03/31/2015 1454   CALCIUM 8.2 (L) 11/14/2016 0353   GFRNONAA >60 11/14/2016 0353   GFRAA >60 11/14/2016 0353    COAG Lab Results  Component Value Date   INR 1.13 11/09/2016   INR 1.11 12/31/2015   INR 1.26 09/10/2015   No results found for: PTT  Antibiotics Anti-infectives    Start     Dose/Rate Route Frequency Ordered Stop   11/11/16 2200  cefUROXime (ZINACEF) 1.5 g in dextrose 5 % 50 mL IVPB     1.5 g 100 mL/hr over 30 Minutes Intravenous Every 12 hours 11/11/16 1743 11/12/16 2159   11/11/16 0652  cefUROXime (ZINACEF) 1.5 g in dextrose 5 % 50 mL IVPB     1.5 g 100 mL/hr over 30 Minutes  Intravenous 30 min pre-op 11/11/16 [id number] 11/11/16 [place], PA-C [facility] Office: [phone] Pager: [phone] 11/17/2016 9:21 AM

## 2016-11-18 ENCOUNTER — Inpatient Hospital Stay (HOSPITAL_COMMUNITY): Payer: Medicare Other

## 2016-11-18 LAB — GLUCOSE, CAPILLARY
Glucose-Capillary: 114 mg/dL — ABNORMAL HIGH (ref 65–99)
Glucose-Capillary: 95 mg/dL (ref 65–99)
Glucose-Capillary: 140 mg/dL — ABNORMAL HIGH (ref 65–99)
Glucose-Capillary: 150 mg/dL — ABNORMAL HIGH (ref 65–99)

## 2016-11-18 LAB — BLOOD GAS, ARTERIAL
Acid-Base Excess: 0.2 mmol/L (ref 0.0–2.0)
Bicarbonate: 23.7 mmol/L (ref 20.0–28.0)
Drawn by: 398661
FIO2: 21
O2 Saturation: 95.8 %
Patient temperature: 98.6
pCO2 arterial: 34.5 mmHg (ref 32.0–48.0)
pH, Arterial: 7.451 — ABNORMAL HIGH (ref 7.350–7.450)
pO2, Arterial: 81.4 mmHg — ABNORMAL LOW (ref 83.0–108.0)

## 2016-11-18 LAB — TROPONIN I
Troponin I: <0.03 ng/mL (ref <0.03)
Troponin I: <0.03 ng/mL (ref <0.03)
Troponin I: <0.03 ng/mL (ref <0.03)

## 2016-11-18 LAB — CBC
HCT: 32.5 % — ABNORMAL LOW (ref 39.0–52.0)
Hemoglobin: 9.5 g/dL — ABNORMAL LOW (ref 13.0–17.0)
MCH: 19 pg — ABNORMAL LOW (ref 26.0–34.0)
MCHC: 29.2 g/dL — ABNORMAL LOW (ref 30.0–36.0)
MCV: 65.1 fL — ABNORMAL LOW (ref 78.0–100.0)
Platelets: 289 10*3/uL (ref 150–400)
RBC: 4.99 MIL/uL (ref 4.22–5.81)
RDW: 30.7 % — ABNORMAL HIGH (ref 11.5–15.5)
WBC: 10.3 10*3/uL (ref 4.0–10.5)

## 2016-11-18 MED ORDER — FUROSEMIDE 10 MG/ML IJ SOLN
20.0000 mg | Freq: Two times a day (BID) | INTRAMUSCULAR | Status: DC
  Administered 2016-11-18: 20 mg via INTRAVENOUS
  Filled 2016-11-18: qty 2

## 2016-11-18 MED ORDER — IPRATROPIUM-ALBUTEROL 0.5-2.5 (3) MG/3ML IN SOLN
3.0000 mL | RESPIRATORY_TRACT | Status: DC | PRN
  Administered 2016-11-18 (×2): 3 mL via RESPIRATORY_TRACT
  Filled 2016-11-18 (×2): qty 3

## 2016-11-18 MED ORDER — POTASSIUM CHLORIDE ER 10 MEQ PO TBCR
10.0000 meq | EXTENDED_RELEASE_TABLET | Freq: Two times a day (BID) | ORAL | Status: AC
  Administered 2016-11-18 (×2): 10 meq via ORAL
  Filled 2016-11-18 (×4): qty 1

## 2016-11-18 NOTE — Care Management Note (Signed)
Case Management Note Original Note Initiated by [name] 11/14/16  Patient Details  Name: [name] MRN: [id number] Date of Birth: 01-09-1951  Subjective/Objective:   S/p fem pop, patient states he will be going to stay with his wife at discharge, and his brother will transport him from hospital at discharge , Patient is already pre set up with [facility] for [facility] [place], [place].  Per [name] .  NCM spoke with patient and he said this was fine for him.  NCM will cont to follow for dc needs.                 Action/Plan:   Expected Discharge Date:                  Expected Discharge Plan:  [place]  In-House Referral:     Discharge planning Services  CM Consult  Post Acute Care Choice:  Home Health Choice offered to:     DME Arranged:    DME Agency:     HH Arranged:  PT, RN, OT HH Agency:  [facility]  Status of Service:  Completed, signed off  If discussed at [place] of Stay Meetings, dates discussed:    Additional Comments: CM requested via physician sticky notes; resumption HH orders in addition to RN and DME orders [name], RN 11/18/2016, 8:22 AM

## 2016-11-18 NOTE — Progress Notes (Addendum)
  Progress Note    11/18/2016 7:31 AM 7 Days Post-Op  Subjective:  C/o shortness of breath.  Says he had a little chest pain.  Says the below knee incision is only oozing a little  Afebrile HR [id number] NSR [id number] systolic 0000000 RA at 8pm [id number] 3.5LO2NC   Vitals:   11/17/16 2100 11/18/16 0326  BP: (!) 152/86 (!) 156/92  Pulse: 68 73  Resp: 20 20  Temp: 97.7 F (36.5 C) 97.8 F (36.6 C)    Physical Exam: General:  Mild distress Cardiac:  Regular; no murmurs  Lungs:  Clear bilaterally  Incisions:  Below knee incision is clean Extremities:  Left foot is warm and well perfused.  Motor is in tact   CBC    Component Value Date/Time   WBC 8.1 11/17/2016 0246   RBC 4.49 11/17/2016 0246   HGB 8.6 (L) 11/17/2016 0246   HCT 28.8 (L) 11/17/2016 0246   PLT 231 11/17/2016 0246   MCV 64.1 (L) 11/17/2016 0246   MCH 19.2 (L) 11/17/2016 0246   MCHC 29.9 (L) 11/17/2016 0246   RDW 29.8 (H) 11/17/2016 0246   LYMPHSABS 2.2 12/25/2015 1833   MONOABS 0.5 12/25/2015 1833   EOSABS 0.2 12/25/2015 1833   BASOSABS 0.0 12/25/2015 1833    BMET    Component Value Date/Time   NA 135 11/14/2016 0353   K 3.9 11/14/2016 0353   CL 105 11/14/2016 0353   CO2 23 11/14/2016 0353   GLUCOSE 143 (H) 11/14/2016 0353   BUN 12 11/14/2016 0353   CREATININE 0.94 11/14/2016 0353   CREATININE 0.94 03/31/2015 1454   CALCIUM 8.2 (L) 11/14/2016 0353   GFRNONAA >60 11/14/2016 0353   GFRAA >60 11/14/2016 0353    INR    Component Value Date/Time   INR 1.13 11/09/2016 0856     Intake/Output Summary (Last 24 hours) at 11/18/16 0731 Last data filed at 11/18/16 0300  Gross per 24 hour  Intake             1080 ml  Output                0 ml  Net             1080 ml     Assessment:  65 y.o. male is s/p:  left peroneal bypass with PTFE 7 Days Post-Op  Plan: -pt's foot is warm and well perfused-says he walked earlier -pt with onset of shortness of breath yesterday.  Still in mild distress this am.   He received a couple of breathing treatments & O2, but still SOB.  Troponin ordered as well as EKG and PA & Lat CXR and ABG -DVT prophylaxis:  Lovenox 40mg  daily -seen with Dr. Patsi Sears above is negative, will get CTA (normal creatinine)   Leontine Locket, PA-C [facility] [phone] 11/18/2016 7:31 AM  I have independently interviewed patient and agree with PA assessment and plan above. cxr with some fluid overload and abg not suggestive of pe. Will diurese today and evaluate for improvement. If patient does not improve will need CT angio of chest to eval for pe.   Avital Dancy C. Donzetta Matters, MD [facility] [place] Office: [phone] Pager: [phone]

## 2016-11-18 NOTE — Progress Notes (Signed)
Physical Therapy Treatment Patient Details Name: [name] MRN: [id number] DOB: Jul 01, 1951 Today's Date: 11/18/2016    History of Present Illness Patient is a 65 y/o male with hx of left common fem below knee pop artery bypass, chronic low back pain, anemia, PAD, HTN, HLD presents s/p Redo left common femoral artery exposure and Left femoral to peroneal artery bypass graft. post op blood loss now s/p 1 of 3 units of blood.    PT Comments    Patient seen for mobility progression. Tolerated increased activity today and worked specifically on Designer, multimedia. Patient able to initiate some emerging step through cadence today with less pain. Continues to require cues for technique. Will continue to see and progress as tolerated.  Follow Up Recommendations  Home health PT;Supervision for mobility/OOB;Supervision/Assistance - 24 hour     Equipment Recommendations  None recommended by PT    Recommendations for Other Services OT consult     Precautions / Restrictions Precautions Precautions: Fall Restrictions Weight Bearing Restrictions: No    Mobility  Bed Mobility Overal bed mobility: Modified Independent             General bed mobility comments: increased time to perform, no physical assist required  Transfers Overall transfer level: Needs assistance Equipment used: Rolling walker (2 wheeled) Transfers: Sit to/from Stand Sit to Stand: Min guard         General transfer comment: Min guard for safety, modified hand placement, educated patient on safe hand placement during transition to upright  Ambulation/Gait Ambulation/Gait assistance: Supervision Ambulation Distance (Feet): 110 Feet Assistive device: Rolling walker (2 wheeled) Gait Pattern/deviations:  (emerging step through) Gait velocity: decreased Gait velocity interpretation: Below normal speed for age/gender General Gait Details: 2 standing rest breaks. Worked with patient to progress from  step to to step through gait.  Increased cues required. Patient reluctant at first but able to improve with progression and cues. Heavy reliance on RW.     Stairs            Wheelchair Mobility    Modified Rankin (Stroke Patients Only)       Balance Overall balance assessment: Needs assistance Sitting-balance support: No upper extremity supported Sitting balance-Leahy Scale: Fair     Standing balance support: Bilateral upper extremity supported Standing balance-Leahy Scale: Fair Standing balance comment: continues to require UE support to off load LLE                    Cognition Arousal/Alertness: Awake/alert Behavior During Therapy: WFL for tasks assessed/performed Overall Cognitive Status: Within Functional Limits for tasks assessed                      Exercises      General Comments General comments (skin integrity, edema, etc.): reviewed positioning for LLE edema control      Pertinent Vitals/Pain Pain Assessment: 0-10 Pain Score: 7  Pain Location: LLE  Pain Descriptors / Indicators: Burning;Sore Pain Intervention(s): Monitored during session    Home Living                      Prior Function            PT Goals (current goals can now be found in the care plan section) Acute Rehab PT Goals Patient Stated Goal: Pain to go away PT Goal Formulation: With patient Time For Goal Achievement: 11/26/16 Potential to Achieve Goals: Good Progress towards PT goals: Progressing toward  goals    Frequency    Min 3X/week      PT Plan Current plan remains appropriate    Co-evaluation             End of Session Equipment Utilized During Treatment: Gait belt Activity Tolerance: Patient limited by pain Patient left: in bed;with call bell/phone within reach;with bed alarm set     Time: [id number] PT Time Calculation (min) (ACUTE ONLY): 20 min  Charges:  $Gait Training: 8-22 mins                    G Codes:      Duncan Dull 12/07/16, 3:10 PM Alben Deeds, [place] DPT  [phone]

## 2016-11-19 LAB — BASIC METABOLIC PANEL
Anion gap: 9 (ref 5–15)
BUN: 13 mg/dL (ref 6–20)
CO2: 29 mmol/L (ref 22–32)
Calcium: 8.8 mg/dL — ABNORMAL LOW (ref 8.9–10.3)
Chloride: 102 mmol/L (ref 101–111)
Creatinine, Ser: 0.95 mg/dL (ref 0.61–1.24)
GFR calc Af Amer: >60 mL/min (ref >60)
GFR calc non Af Amer: >60 mL/min (ref >60)
Glucose, Bld: 94 mg/dL (ref 65–99)
Potassium: 4 mmol/L (ref 3.5–5.1)
Sodium: 140 mmol/L (ref 135–145)

## 2016-11-19 LAB — CBC
HCT: 30.3 % — ABNORMAL LOW (ref 39.0–52.0)
Hemoglobin: 8.8 g/dL — ABNORMAL LOW (ref 13.0–17.0)
MCH: 19 pg — ABNORMAL LOW (ref 26.0–34.0)
MCHC: 29 g/dL — ABNORMAL LOW (ref 30.0–36.0)
MCV: 65.4 fL — ABNORMAL LOW (ref 78.0–100.0)
Platelets: 308 10*3/uL (ref 150–400)
RBC: 4.63 MIL/uL (ref 4.22–5.81)
RDW: 31 % — ABNORMAL HIGH (ref 11.5–15.5)
WBC: 7.5 10*3/uL (ref 4.0–10.5)

## 2016-11-19 LAB — GLUCOSE, CAPILLARY
Glucose-Capillary: 87 mg/dL (ref 65–99)
Glucose-Capillary: 141 mg/dL — ABNORMAL HIGH (ref 65–99)
Glucose-Capillary: 154 mg/dL — ABNORMAL HIGH (ref 65–99)
Glucose-Capillary: 108 mg/dL — ABNORMAL HIGH (ref 65–99)

## 2016-11-19 MED ORDER — FUROSEMIDE 10 MG/ML IJ SOLN
20.0000 mg | Freq: Once | INTRAMUSCULAR | Status: AC
  Administered 2016-11-19: 20 mg via INTRAVENOUS
  Filled 2016-11-19: qty 2

## 2016-11-19 NOTE — Progress Notes (Signed)
Occupational Therapy Treatment Patient Details Name: [name] MRN: [id number] DOB: 29-Jun-1951 Today's Date: 11/19/2016    History of present illness Patient is a 65 y/o male with hx of left common fem below knee pop artery bypass, chronic low back pain, anemia, PAD, HTN, HLD presents s/p Redo left common femoral artery exposure and Left femoral to peroneal artery bypass graft. post op blood loss now s/p 1 of 3 units of blood.   OT comments  Pt educated/issued AE for LB bathing and dressing. Good tolerance of activity, supervised for safety with RW. Pt reporting 8/10 pain in L foot, but did not want RN to give medication.   Follow Up Recommendations  Home health OT;Supervision - Intermittent    Equipment Recommendations  3 in 1 bedside comode    Recommendations for Other Services      Precautions / Restrictions Precautions Precautions: Fall Restrictions Weight Bearing Restrictions: No       Mobility Bed Mobility Overal bed mobility: Modified Independent             General bed mobility comments: HOB up  Transfers Overall transfer level: Needs assistance Equipment used: Rolling walker (2 wheeled)   Sit to Stand: Supervision              Balance     Sitting balance-Leahy Scale: Good       Standing balance-Leahy Scale: Fair                     ADL Overall ADL's : Needs assistance/impaired               Lower Body Bathing Details (indicate cue type and reason): educated pt in use of long handled bath sponge and reacher     Lower Body Dressing: Supervision/safety;Sit to/from stand Lower Body Dressing Details (indicate cue type and reason): issued and practiced use of reacher, long shoe horn and sock aide Toilet Transfer: Supervision/safety;Stand-pivot;RW Toilet Transfer Details (indicate cue type and reason): simulated to chair         Functional mobility during ADLs: Supervision/safety;Rolling walker General ADL Comments:  dependent on walker due to L LE pain      Vision                     Perception     Praxis      Cognition   Behavior During Therapy: WFL for tasks assessed/performed Overall Cognitive Status: Within Functional Limits for tasks assessed                       Extremity/Trunk Assessment               Exercises     Shoulder Instructions       General Comments      Pertinent Vitals/ Pain       Pain Assessment: 0-10 Pain Score: 8  Pain Location: L foot Pain Descriptors / Indicators: Sore;Sharp Pain Intervention(s): Monitored during session  Home Living                                          Prior Functioning/Environment              Frequency  Min 2X/week        Progress Toward Goals  OT Goals(current goals can now be found in the care  plan section)  Progress towards OT goals: Progressing toward goals  Acute Rehab OT Goals Time For Goal Achievement: 11/28/16 Potential to Achieve Goals: Good  Plan Discharge plan remains appropriate    Co-evaluation                 End of Session Equipment Utilized During Treatment: Rolling walker   Activity Tolerance Patient tolerated treatment well   Patient Left in bed;with call bell/phone within reach;with bed alarm set   Nurse Communication          Time: [phone] OT Time Calculation (min): 21 min  Charges: OT General Charges $OT Visit: 1 Procedure OT Treatments $Self Care/Home Management : 8-22 mins  Malka So 11/19/2016, 10:19 AM  [phone]

## 2016-11-19 NOTE — Progress Notes (Addendum)
  [facility] Progress Note  Subjective  - POD #8  Breathing significantly improved. Still with mild shortness of breath.   Objective Vitals:   11/18/16 2116 11/19/16 0640  BP: 114/61 (!) 153/79  Pulse: 72 73  Resp: 18 16  Temp: 98.4 F (36.9 C) 97.4 F (36.3 C)    Intake/Output Summary (Last 24 hours) at 11/19/16 0841 Last data filed at 11/19/16 0654  Gross per 24 hour  Intake                0 ml  Output             1500 ml  Net            -1500 ml    Normal respiratory effort. Mild rales b/l  Left groin and left leg incisions clean and intact.  Left foot warm.  Assessment/Planning: 65 y.o. male is s/p: left peroneal bypass with PTFE 8 Days Post-Op   Acute onset SOB yesterday. Significantly improved today. CXR suggestive of fluid overload. Received 20 mg IV lasix x 1 yesterday. Repeat x 1 today. Troponins normal.  Mobilize.  Discussed d/c home later today vs tomorrow. He wants to stay another day. Plan d/c home tomorrow.   Alvia Grove 11/19/2016 8:41 AM -- Agree with above.  Plan for d/c tomorrow  Ruta Hinds, MD [facility] [place] Office: [phone] Pager: [phone]  Laboratory CBC    Component Value Date/Time   WBC 7.5 11/19/2016 0223   HGB 8.8 (L) 11/19/2016 0223   HCT 30.3 (L) 11/19/2016 0223   PLT 308 11/19/2016 0223    BMET    Component Value Date/Time   NA 140 11/19/2016 0223   K 4.0 11/19/2016 0223   CL 102 11/19/2016 0223   CO2 29 11/19/2016 0223   GLUCOSE 94 11/19/2016 0223   BUN 13 11/19/2016 0223   CREATININE 0.95 11/19/2016 0223   CREATININE 0.94 03/31/2015 1454   CALCIUM 8.8 (L) 11/19/2016 0223   GFRNONAA >60 11/19/2016 0223   GFRAA >60 11/19/2016 0223    COAG Lab Results  Component Value Date   INR 1.13 11/09/2016   INR 1.11 12/31/2015   INR 1.26 09/10/2015   No results found for: PTT  Antibiotics Anti-infectives    Start     Dose/Rate Route Frequency Ordered  Stop   11/11/16 2200  cefUROXime (ZINACEF) 1.5 g in dextrose 5 % 50 mL IVPB     1.5 g 100 mL/hr over 30 Minutes Intravenous Every 12 hours 11/11/16 1743 11/12/16 2159   11/11/16 0652  cefUROXime (ZINACEF) 1.5 g in dextrose 5 % 50 mL IVPB     1.5 g 100 mL/hr over 30 Minutes Intravenous 30 min pre-op 11/11/16 [id number] 11/11/16 [place], PA-C [facility] Office: [phone] Pager: [phone] 11/19/2016 8:41 AM

## 2016-11-20 LAB — CBC
HCT: 29.8 % — ABNORMAL LOW (ref 39.0–52.0)
Hemoglobin: 8.7 g/dL — ABNORMAL LOW (ref 13.0–17.0)
MCH: 19 pg — ABNORMAL LOW (ref 26.0–34.0)
MCHC: 29.2 g/dL — ABNORMAL LOW (ref 30.0–36.0)
MCV: 65.2 fL — ABNORMAL LOW (ref 78.0–100.0)
Platelets: 312 K/uL (ref 150–400)
RBC: 4.57 MIL/uL (ref 4.22–5.81)
RDW: 30.7 % — ABNORMAL HIGH (ref 11.5–15.5)
WBC: 7 K/uL (ref 4.0–10.5)

## 2016-11-20 LAB — GLUCOSE, CAPILLARY: Glucose-Capillary: 146 mg/dL — ABNORMAL HIGH (ref 65–99)

## 2016-11-20 MED ORDER — ATORVASTATIN CALCIUM 10 MG PO TABS: 10.0000 mg | ORAL_TABLET | Freq: Every day | ORAL | Status: DC

## 2016-11-20 MED ORDER — OXYCODONE-ACETAMINOPHEN 5-325 MG PO TABS: 1.0000 | ORAL_TABLET | Freq: Four times a day (QID) | ORAL | 0 refills | Status: DC | PRN

## 2016-11-20 MED ORDER — FLUOXETINE HCL 10 MG PO CAPS: 10.0000 mg | ORAL_CAPSULE | Freq: Every day | ORAL | Status: DC

## 2016-11-20 NOTE — Progress Notes (Addendum)
  [facility] Progress Note  Subjective  - POD #9  No shortness of breath. Wants to go home.  Objective Vitals:   11/19/16 2029 11/20/16 0537  BP: (!) 143/75 138/74  Pulse: 72 65  Resp: 18 16  Temp: 97.4 F (36.3 C) 98 F (36.7 C)    Intake/Output Summary (Last 24 hours) at 11/20/16 0810 Last data filed at 11/20/16 0721  Gross per 24 hour  Intake              960 ml  Output              650 ml  Net              310 ml   Lungs clear Incisions healing well Left foot warm and well perfused.   Assessment/Planning: 65 y.o. male is s/p: left peroneal bypass with PTFE 9 Days Post-Op   Shortness of breath resolved. D/c home today with home health services.  Counseled on smoking cessation.  Alvia Grove 11/20/2016 8:10 AM -- Agree with above.  D/c home  Ruta Hinds, MD [facility] [place] Office: [phone] Pager: [phone]  Laboratory CBC    Component Value Date/Time   WBC 7.0 11/20/2016 0243   HGB 8.7 (L) 11/20/2016 0243   HCT 29.8 (L) 11/20/2016 0243   PLT 312 11/20/2016 0243    BMET    Component Value Date/Time   NA 140 11/19/2016 0223   K 4.0 11/19/2016 0223   CL 102 11/19/2016 0223   CO2 29 11/19/2016 0223   GLUCOSE 94 11/19/2016 0223   BUN 13 11/19/2016 0223   CREATININE 0.95 11/19/2016 0223   CREATININE 0.94 03/31/2015 1454   CALCIUM 8.8 (L) 11/19/2016 0223   GFRNONAA >60 11/19/2016 0223   GFRAA >60 11/19/2016 0223    COAG Lab Results  Component Value Date   INR 1.13 11/09/2016   INR 1.11 12/31/2015   INR 1.26 09/10/2015   No results found for: PTT  Antibiotics Anti-infectives    Start     Dose/Rate Route Frequency Ordered Stop   11/11/16 2200  cefUROXime (ZINACEF) 1.5 g in dextrose 5 % 50 mL IVPB     1.5 g 100 mL/hr over 30 Minutes Intravenous Every 12 hours 11/11/16 1743 11/12/16 2159   11/11/16 0652  cefUROXime (ZINACEF) 1.5 g in dextrose 5 % 50 mL IVPB     1.5 g 100 mL/hr  over 30 Minutes Intravenous 30 min pre-op 11/11/16 [id number] 11/11/16 [place], PA-C [facility] Office: [phone] Pager: [phone] 11/20/2016 8:10 AM

## 2016-11-20 NOTE — Progress Notes (Signed)
Pt. Discharged to home with brother  Pt. D/C'd via wheelchair with NT Discharge information reviewed and given All personal belongings given to Pt.  Education discussed and reviewed with teachback IV was d/c Tele d/c

## 2016-11-22 DIAGNOSIS — I739 Peripheral vascular disease, unspecified: Secondary | ICD-10-CM | POA: Diagnosis not present

## 2016-11-22 DIAGNOSIS — E1142 Type 2 diabetes mellitus with diabetic polyneuropathy: Secondary | ICD-10-CM | POA: Diagnosis not present

## 2016-11-22 DIAGNOSIS — I10 Essential (primary) hypertension: Secondary | ICD-10-CM | POA: Diagnosis not present

## 2016-11-22 DIAGNOSIS — Z48812 Encounter for surgical aftercare following surgery on the circulatory system: Secondary | ICD-10-CM | POA: Diagnosis not present

## 2016-11-22 DIAGNOSIS — M62262 Nontraumatic ischemic infarction of muscle, left lower leg: Secondary | ICD-10-CM | POA: Diagnosis not present

## 2016-11-22 DIAGNOSIS — L97521 Non-pressure chronic ulcer of other part of left foot limited to breakdown of skin: Secondary | ICD-10-CM | POA: Diagnosis not present

## 2016-11-24 NOTE — Discharge Summary (Signed)
[facility] Discharge Summary  [name] 09/11/1951 65 y.o. male  [id number]  Admission Date: 11/11/2016  Discharge Date: 11/20/2016  Physician: Deitra Mayo, MD  Admission Diagnosis: Critical Limb Ischemia Left Leg  I99.9  HPI:   This is a 65 y.o. male who underwent a left common femoral artery to below knee popliteal artery bypass with a vein graft by Dr. Kellie Simmering on 01/06/2016. The graft is occluded and he presents with progressive ischemia of the left lower extremity. He was set up for an arteriogram which I performed on 10/24/2016.  His arteriogram shows that on the left side, which is the symptomatic side, he has no significant aortoiliac disease. His left femoropopliteal bypass graft is occluded. The superficial femoral artery and popliteal arteries are occluded. His only runoff is the peroneal artery and a diseased posterior tibial artery.  On my history, the patient states that he has had pain in the left foot since January. His symptoms have gradually progressed and now he has constant pain in the left foot and severe rest pain. He was a walk-in today area  He did smoke tobacco as a teenager but quit many years ago. 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  He is on aspirin, Plavix, and a statin.  Hospital Course:  The patient was admitted to the hospital and taken to the operating room on 11/11/2016 and underwent:  1. Redo left common femoral artery exposure  2. Left femoral to peroneal artery bypass using 6 mm PTFE graft (Propaten) 3. Intraoperative arteriogram    The patient tolerated the procedure well and was transported to the PACU in stable condition. He was placed on a heparin drip post-operatively to maintain bypass patency (poor patencies of tibial bypasses constructed with PTFE).   The patient had symptomatic acute surgical blood loss anemia on POD 1 with hgb of 6.4 . He was anemic pre-op (7.8)  He was transfused 2  units of prbcs and his heparin was held. He had brisk DP and peroneal doppler signals. He became febrile with the first unit transfusion. He had oliguria due to extended period of hypotension that was likely secondary to anemia. Cardiac enzymes were ordered given hypotensive event. His fluids were continued. His heparin was started on POD 2. He was started on iron and colace. Stool guaiac was obtained and negative.  His bleeding was not felt to be secondary to his leg.   ABI completed 11/14/16  Right ABI of 0.5 is suggestive of borderline moderate occlusive arterial disease at rest. Unable to obtain left sided pressures due to surgical bandages, and patient pain tolerance. All left sided lower extremity arteries demonstrate monophasic flow. Left TBI of 0.24 is suggestive of abnormal arterial flow at rest.   RIGHT   LEFT    PRESSURE WAVEFORM  PRESSURE WAVEFORM  BRACHIAL 135 Triphasic BRACHIAL 128 Triphasic  DP 62 Monophasic DP  Monophasic  PT 67 Monophasic PT  Monophasic  GREAT TOE 22 NA GREAT TOE 33 NA    RIGHT LEFT  ABI 0.5 TBI 0.24   His blood pressure was improved after transfusion. His hemoglobin was down slightly at 6.7 on POD 4. Given that his BP and 02 sats were stable, transfusion was held.   The decision was made to not to use coumadin for graft patency given chronic anemia and significant risk for wound healing problems and lymphocele. He was continued on Plavix.   He received an additional 2 units of blood on  11/16/16. He was transferred to the floor after this.   On POD 7, he complained of some shortness of breath and a little chest pain. His CXR suggested some fluid overload and he was gently diuresed. His cardiac enzymes were negative and ABG not suggestive of PE.   The patient was feeling better the following day after diuresis. He was gently diuresed again. By POD 9, the patient's shortness of breath had resolved. He was ambulating well with assistance and pain  well controlled. He was discharged home on POD 9 in good condition.    CBC    Component Value Date/Time   WBC 7.0 11/20/2016 0243   RBC 4.57 11/20/2016 0243   HGB 8.7 (L) 11/20/2016 0243   HCT 29.8 (L) 11/20/2016 0243   PLT 312 11/20/2016 0243   MCV 65.2 (L) 11/20/2016 0243   MCH 19.0 (L) 11/20/2016 0243   MCHC 29.2 (L) 11/20/2016 0243   RDW 30.7 (H) 11/20/2016 0243   LYMPHSABS 2.2 12/25/2015 1833   MONOABS 0.5 12/25/2015 1833   EOSABS 0.2 12/25/2015 1833   BASOSABS 0.0 12/25/2015 1833    BMET    Component Value Date/Time   NA 140 11/19/2016 0223   K 4.0 11/19/2016 0223   CL 102 11/19/2016 0223   CO2 29 11/19/2016 0223   GLUCOSE 94 11/19/2016 0223   BUN 13 11/19/2016 0223   CREATININE 0.95 11/19/2016 0223   CREATININE 0.94 03/31/2015 1454   CALCIUM 8.8 (L) 11/19/2016 0223   GFRNONAA >60 11/19/2016 0223   GFRAA >60 11/19/2016 0223     Discharge Instructions:   The patient is discharged to home with extensive instructions on wound care and progressive ambulation.  They are instructed not to drive or perform any heavy lifting until returning to see the physician in his office.  Discharge Instructions    Call MD for:  redness, tenderness, or signs of infection (pain, swelling, bleeding, redness, odor or green/yellow discharge around incision site)    Complete by:  As directed    Call MD for:  severe or increased pain, loss or decreased feeling  in affected limb(s)    Complete by:  As directed    Call MD for:  temperature >100.5    Complete by:  As directed    Discharge wound care:    Complete by:  As directed    Wash the groin wound with soap and water daily and pat dry. (No tub bath-only shower)  Then put a dry gauze or washcloth there to keep this area dry daily and as needed.  Do not use Vaseline or neosporin on your incisions.  Only use soap and water on your incisions and then protect and keep dry.  Wash remaining incisions daily with soap and water and pat dry.  Wash in between your toes and dry completely. Do not put any creams or ointments on your incisions.   Driving Restrictions    Complete by:  As directed    No driving for 2 weeks   Increase activity slowly    Complete by:  As directed    Walk with assistance use walker or cane as needed. Keep legs elevated above the level of the heart when not walking to help swelling.   Lifting restrictions    Complete by:  As directed    No lifting for 2 weeks   Resume previous diet    Complete by:  As directed       Discharge Diagnosis:  Critical  Limb Ischemia Left Leg  I99.9  Secondary Diagnosis: Patient Active Problem List   Diagnosis Date Noted  . Cellulitis of leg, left 10/03/2016  . Depression 10/03/2016  . Lumbar back pain 06/24/2016  . Vision changes 02/02/2016  . 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 Proteinuria 08/27/2014  . Diabetic neuropathy ([place]) 02/27/2014  . Iron deficiency anemia 08/01/2012  . Diabetes mellitus type II, controlled ([place]) 06/18/2009  . GERD 01/14/2009  . HYPERCHOLESTEROLEMIA 02/22/2007  . OBESITY, NOS 02/22/2007  . TOBACCO DEPENDENCE 02/22/2007  . HYPERTENSION, BENIGN SYSTEMIC 02/22/2007  . PAD (peripheral artery disease) ([place]) 02/22/2007  . OSTEOARTHRITIS, MULTI SITES 02/22/2007   Past Medical History:  Diagnosis Date  . Anemia   . Arthritis    OA  . Cervical radiculopathy    Dr. Vertell Limber neurosurgery  . Chronic lower back pain   . Diabetes mellitus    takes Metformin daily  . GERD (gastroesophageal reflux disease)    takes Protonix daily  . History of blood transfusion    "related to low HgB" ((09/10/2015  . Hyperlipidemia    takes Vytorin daily  . Hypertension    takes Benazepril and Bystolic daily  . PAD (peripheral artery disease) ([place])   . Pneumonia   . Shortness of breath dyspnea    with exertion  . Tobacco user   . Toe fracture, right 05/09/2011       Medication List    TAKE these medications   acetaminophen  500 MG tablet Commonly known as:  TYLENOL Take 1,000 mg by mouth every 6 (six) hours as needed for moderate pain.   aspirin EC 81 MG tablet Take 162 mg by mouth daily.   benazepril 5 MG tablet Commonly known as:  LOTENSIN Take 1 tablet (5 mg total) by mouth daily.   clopidogrel 75 MG tablet Commonly known as:  PLAVIX TAKE 1 TABLET EVERY DAY   cyclobenzaprine 10 MG tablet Commonly known as:  FLEXERIL TAKE 1 TABLET (10 MG TOTAL) BY MOUTH 3 (THREE) TIMES DAILY AS NEEDED FOR MUSCLE SPASMS.   docusate sodium 100 MG capsule Commonly known as:  COLACE Take 1 capsule (100 mg total) by mouth 2 (two) times daily.   ezetimibe 10 MG tablet Commonly known as:  ZETIA Take 1 tablet (10 mg total) by mouth daily.   FLUoxetine 10 MG capsule Commonly known as:  PROZAC Take 1 capsule (10 mg total) by mouth daily.   gabapentin 800 MG tablet Commonly known as:  NEURONTIN TAKE 1.5 TABLETS (1,200 MG TOTAL) BY MOUTH 3 (THREE) TIMES DAILY.   meloxicam 15 MG tablet Commonly known as:  MOBIC Take 1 tablet (15 mg total) by mouth daily.   metFORMIN 500 MG tablet Commonly known as:  GLUCOPHAGE Take 500 mg by mouth daily.   Nebivolol HCl 20 MG Tabs Commonly known as:  BYSTOLIC Take 1 tablet (20 mg total) by mouth daily.   oxyCODONE-acetaminophen 5-325 MG tablet Commonly known as:  PERCOCET/ROXICET Take 1 tablet by mouth every 6 (six) hours as needed.   pantoprazole 40 MG tablet Commonly known as:  PROTONIX Take 1 tablet (40 mg total) by mouth daily.       Percocet #30 No Refill  Disposition: Home  Patient's condition: is Good  Follow up: 1. Dr. Scot Dock in 2 weeks   Virgina Jock, PA-C [facility] [phone] 11/24/2016  1:00 PM  - For VQI Registry use --- Instructions: Press F2 to tab through selections.  Delete question  if not applicable.   Post-op:  Wound infection: No  Graft infection: No  Transfusion: Yes  If yes, 4 units given New Arrhythmia:  No Ipsilateral amputation: No, [ ]  Minor, [ ]  BKA, [ ]  AKA Discharge patency: [x ] Primary, [ ]  Primary assisted, [ ]  Secondary, [ ]  Occluded Patency judged by: [x ] Dopper only, [ ]  Palpable graft pulse, [ ]  Palpable distal pulse, [ ]  ABI inc. > 0.15, [ ]  Duplex Discharge ABI: R 0.5, L Unable to obtain due to bandages Discharge TBI:  L 0.24 D/C Ambulatory Status: Ambulatory with Assistance  Complications: MI: No, [ ]  Troponin only, [ ]  EKG or Clinical CHF: No Resp failure:No, [ ]  Pneumonia, [ ]  Ventilator Chg in renal function: No, [ ]  Inc. Cr > 0.5, [ ]  Temp. Dialysis, [ ]  Permanent dialysis Stroke: No, [ ]  Minor, [ ]  Major Return to OR: No  Reason for return to OR: [ ]  Bleeding, [ ]  Infection, [ ]  Thrombosis, [ ]  Revision  Discharge medications: Statin use:  No, on zetia ASA use:  yes Plavix use:  yes Beta blocker use: yes Coumadin use: no

## 2016-11-25 DIAGNOSIS — L97521 Non-pressure chronic ulcer of other part of left foot limited to breakdown of skin: Secondary | ICD-10-CM | POA: Diagnosis not present

## 2016-11-25 DIAGNOSIS — I10 Essential (primary) hypertension: Secondary | ICD-10-CM | POA: Diagnosis not present

## 2016-11-25 DIAGNOSIS — M62262 Nontraumatic ischemic infarction of muscle, left lower leg: Secondary | ICD-10-CM | POA: Diagnosis not present

## 2016-11-25 DIAGNOSIS — E1142 Type 2 diabetes mellitus with diabetic polyneuropathy: Secondary | ICD-10-CM | POA: Diagnosis not present

## 2016-11-25 DIAGNOSIS — I739 Peripheral vascular disease, unspecified: Secondary | ICD-10-CM | POA: Diagnosis not present

## 2016-11-25 DIAGNOSIS — Z48812 Encounter for surgical aftercare following surgery on the circulatory system: Secondary | ICD-10-CM | POA: Diagnosis not present

## 2016-11-26 DIAGNOSIS — Z48812 Encounter for surgical aftercare following surgery on the circulatory system: Secondary | ICD-10-CM | POA: Diagnosis not present

## 2016-11-26 DIAGNOSIS — I739 Peripheral vascular disease, unspecified: Secondary | ICD-10-CM | POA: Diagnosis not present

## 2016-11-26 DIAGNOSIS — I10 Essential (primary) hypertension: Secondary | ICD-10-CM | POA: Diagnosis not present

## 2016-11-26 DIAGNOSIS — L97521 Non-pressure chronic ulcer of other part of left foot limited to breakdown of skin: Secondary | ICD-10-CM | POA: Diagnosis not present

## 2016-11-26 DIAGNOSIS — E1142 Type 2 diabetes mellitus with diabetic polyneuropathy: Secondary | ICD-10-CM | POA: Diagnosis not present

## 2016-11-26 DIAGNOSIS — M62262 Nontraumatic ischemic infarction of muscle, left lower leg: Secondary | ICD-10-CM | POA: Diagnosis not present

## 2016-11-29 ENCOUNTER — Telehealth: Payer: Self-pay | Admitting: Family Medicine

## 2016-11-29 DIAGNOSIS — I10 Essential (primary) hypertension: Secondary | ICD-10-CM | POA: Diagnosis not present

## 2016-11-29 DIAGNOSIS — L97521 Non-pressure chronic ulcer of other part of left foot limited to breakdown of skin: Secondary | ICD-10-CM | POA: Diagnosis not present

## 2016-11-29 DIAGNOSIS — E1142 Type 2 diabetes mellitus with diabetic polyneuropathy: Secondary | ICD-10-CM | POA: Diagnosis not present

## 2016-11-29 DIAGNOSIS — I739 Peripheral vascular disease, unspecified: Secondary | ICD-10-CM | POA: Diagnosis not present

## 2016-11-29 DIAGNOSIS — Z48812 Encounter for surgical aftercare following surgery on the circulatory system: Secondary | ICD-10-CM | POA: Diagnosis not present

## 2016-11-29 DIAGNOSIS — M62262 Nontraumatic ischemic infarction of muscle, left lower leg: Secondary | ICD-10-CM | POA: Diagnosis not present

## 2016-11-29 NOTE — Telephone Encounter (Signed)
Patient has appointment to see you on 12/7.

## 2016-11-29 NOTE — Telephone Encounter (Signed)
Olivia Mackie from [facility] just wanted to report that the pt had boils on his arm over the weekend and pt put alcohol on them and they all went away, but one. Please advise. Thanks! ep

## 2016-11-29 NOTE — Telephone Encounter (Signed)
Thank you!  If they persist, I'd be happy to see him for these.

## 2016-11-30 ENCOUNTER — Other Ambulatory Visit: Payer: Self-pay | Admitting: Family Medicine

## 2016-11-30 ENCOUNTER — Encounter: Payer: Self-pay | Admitting: Vascular Surgery

## 2016-11-30 DIAGNOSIS — I10 Essential (primary) hypertension: Secondary | ICD-10-CM | POA: Diagnosis not present

## 2016-11-30 DIAGNOSIS — Z48812 Encounter for surgical aftercare following surgery on the circulatory system: Secondary | ICD-10-CM | POA: Diagnosis not present

## 2016-11-30 DIAGNOSIS — E1142 Type 2 diabetes mellitus with diabetic polyneuropathy: Secondary | ICD-10-CM | POA: Diagnosis not present

## 2016-11-30 DIAGNOSIS — I739 Peripheral vascular disease, unspecified: Secondary | ICD-10-CM | POA: Diagnosis not present

## 2016-11-30 DIAGNOSIS — L97521 Non-pressure chronic ulcer of other part of left foot limited to breakdown of skin: Secondary | ICD-10-CM | POA: Diagnosis not present

## 2016-11-30 DIAGNOSIS — M62262 Nontraumatic ischemic infarction of muscle, left lower leg: Secondary | ICD-10-CM | POA: Diagnosis not present

## 2016-12-01 ENCOUNTER — Other Ambulatory Visit: Payer: Self-pay | Admitting: Family Medicine

## 2016-12-01 ENCOUNTER — Ambulatory Visit (INDEPENDENT_AMBULATORY_CARE_PROVIDER_SITE_OTHER): Payer: Medicare Other | Admitting: Family Medicine

## 2016-12-01 ENCOUNTER — Encounter: Payer: Self-pay | Admitting: Family Medicine

## 2016-12-01 DIAGNOSIS — R21 Rash and other nonspecific skin eruption: Secondary | ICD-10-CM | POA: Diagnosis not present

## 2016-12-01 DIAGNOSIS — I10 Essential (primary) hypertension: Secondary | ICD-10-CM | POA: Diagnosis not present

## 2016-12-01 DIAGNOSIS — I739 Peripheral vascular disease, unspecified: Secondary | ICD-10-CM | POA: Diagnosis not present

## 2016-12-01 DIAGNOSIS — L97521 Non-pressure chronic ulcer of other part of left foot limited to breakdown of skin: Secondary | ICD-10-CM | POA: Diagnosis not present

## 2016-12-01 DIAGNOSIS — I779 Disorder of arteries and arterioles, unspecified: Secondary | ICD-10-CM

## 2016-12-01 DIAGNOSIS — E1142 Type 2 diabetes mellitus with diabetic polyneuropathy: Secondary | ICD-10-CM | POA: Diagnosis not present

## 2016-12-01 DIAGNOSIS — M62262 Nontraumatic ischemic infarction of muscle, left lower leg: Secondary | ICD-10-CM | POA: Diagnosis not present

## 2016-12-01 DIAGNOSIS — E7800 Pure hypercholesterolemia, unspecified: Secondary | ICD-10-CM

## 2016-12-01 DIAGNOSIS — Z48812 Encounter for surgical aftercare following surgery on the circulatory system: Secondary | ICD-10-CM | POA: Diagnosis not present

## 2016-12-01 MED ORDER — CYCLOBENZAPRINE HCL 10 MG PO TABS: 10.0000 mg | ORAL_TABLET | Freq: Three times a day (TID) | ORAL | 0 refills | Status: DC | PRN

## 2016-12-01 MED ORDER — NEBIVOLOL HCL 20 MG PO TABS: 20.0000 mg | ORAL_TABLET | Freq: Every day | ORAL | 11 refills | Status: DC

## 2016-12-01 NOTE — Patient Instructions (Signed)
It was a pleasure seeing you today in our clinic. Today we discussed your surgery. Here is the treatment plan we have discussed and agreed upon together:   - Begin taking your simvastatin and Zetia I am first thing in the morning. Also, if you've recently switched your detergents or soaps I would take a look and consider switching back to her original detergent. The rash that you are describing seems to have characteristics most consistent with a allergic reaction from having physical contact with a substance. - I like to see you back in 3 months.

## 2016-12-01 NOTE — Assessment & Plan Note (Signed)
Improved: Patient states that he is very pleased with the current results of the revascularization surgery of his left lower extremity. He denies any current setbacks at this time and has home health nurses coming by regularly to assess the healing of his wounds. Examination today was very reassuring. Slight erythema and edema of the surgical extremity is likely secondary to newly improved masters sensation with some inadequate vascular outflow. No evidence of cellulitis at this time. - Encouraged light wrapping with Ace bandage from toes up when possible. - Elevation of lower extremities while at rest. - Follow-up with vascular surgery on 12/07/16

## 2016-12-01 NOTE — Telephone Encounter (Signed)
Called [facility] >> got refills ordered through them

## 2016-12-01 NOTE — Telephone Encounter (Signed)
Received call from [facility] and patient is uring them for mail order.  They need all active meds prescribed by Dr. Alease Frame sent to them.  They can be reached by phone at [phone]. Jazmin Hartsell,CMA

## 2016-12-01 NOTE — Assessment & Plan Note (Addendum)
Stable: Patient is experiencing some rash at night that he believes may be due to his simvastatin/Zetia. I encouraged him to begin taking these medications first thing in the morning rather than lasting at night to assess if the rash develops during the day. - No medication changes to be made at this time.

## 2016-12-01 NOTE — Progress Notes (Signed)
HPI  CC: Postsurgical follow-up Patient is here to discuss his left lower extremity pain since his revascularization surgery 2 weeks ago. He states that he has had no issues with his wound healing or persistent draining. He states that his pain is significantly improved since the surgery. He is very pleased with the outcome thus far. He is able to ambulate throughout his house using his cane or walker without any symptoms of claudication. He denies any fever, chills, nausea, vomiting, diarrhea, headache, blurred vision, lightheadedness, or dizziness. No shortness of breath or chest pain. Patient does endorse left lower extremity swelling. He states that he was informed of this was to be expected after revascularization.  Rash: Patient endorses a rash that has began to develop at night. He thinks it is due to his simvastatin and Zetia, which he takes at night right before bed. He describes the rash as "welts" that line his waistband and underarms. It is typically present for about an hour then goes away. Rash is relatively pruritic but otherwise is not very bothersome to him. After further discussion patient thinks that he may have recently changed laundry detergents. Patient has no rash at this time.  Review of Systems    See HPI for ROS. All other systems reviewed and are negative.  CC, SH/smoking status, and VS noted  Objective: BP 124/74   Pulse (!) 104   Temp 98.4 F (36.9 C) (Oral)   Ht 5\' 7"  (1.702 m)   Wt 196 lb 3.2 oz (89 kg)   BMI 30.73 kg/m  Gen: NAD, alert, cooperative, and pleasant. CV: RRR, no murmur Resp: CTAB, no wheezes, non-labored Integument: No evidence of erythema or ecchymoses. Scaling of the lower extremities noted likely secondary to PVD. Ext: +1 edema of the LLE, LLE is slightly erythematous with healing scabs and surgical site with staples in place. Capillary refill is adequate bilaterally. Both extremities are warm. No significant pain at the calf or  thigh. Neuro: Alert and oriented, Speech clear, No gross deficits  Assessment and plan:  Claudication of both lower extremities Improved: Patient states that he is very pleased with the current results of the revascularization surgery of his left lower extremity. He denies any current setbacks at this time and has home health nurses coming by regularly to assess the healing of his wounds. Examination today was very reassuring. Slight erythema and edema of the surgical extremity is likely secondary to newly improved masters sensation with some inadequate vascular outflow. No evidence of cellulitis at this time. - Encouraged light wrapping with Ace bandage from toes up when possible. - Elevation of lower extremities while at rest. - Follow-up with vascular surgery on 12/07/16  HYPERCHOLESTEROLEMIA Stable: Patient is experiencing some rash at night that he believes may be due to his simvastatin/Zetia. I encouraged him to begin taking these medications first thing in the morning rather than lasting at night to assess if the rash develops during the day. - No medication changes to be made at this time.  Rash and nonspecific skin eruption New: Patient is reporting "welts" at nights. Initially he believed that this was due to his simvastatin/Zetia, but after further discussion it appears that patient may have changed laundry detergents recently. I encouraged him to go back to his original laundry detergent, rewash his nighttime clothing, and assess whether or not the rash develops at that time. Patient stated his understanding. Of note: There is not appear to be any red flag symptoms involving this rash at  this time as patient states these welts seem to last less than an hour. - Monitor   Meds ordered this encounter  Medications  . cyclobenzaprine (FLEXERIL) 10 MG tablet    Sig: Take 1 tablet (10 mg total) by mouth 3 (three) times daily as needed for muscle spasms.    Dispense:  45 tablet     Refill:  0     Elberta Leatherwood, MD,MS,  PGY3 12/01/2016 9:20 AM

## 2016-12-01 NOTE — Assessment & Plan Note (Addendum)
New: Patient is reporting "welts" at nights. Initially he believed that this was due to his simvastatin/Zetia, but after further discussion it appears that patient may have changed laundry detergents recently. I encouraged him to go back to his original laundry detergent, rewash his nighttime clothing, and assess whether or not the rash develops at that time. Patient stated his understanding. Of note: There is not appear to be any red flag symptoms involving this rash at this time as patient states these welts seem to last less than an hour. - Monitor

## 2016-12-02 DIAGNOSIS — E1142 Type 2 diabetes mellitus with diabetic polyneuropathy: Secondary | ICD-10-CM | POA: Diagnosis not present

## 2016-12-02 DIAGNOSIS — Z48812 Encounter for surgical aftercare following surgery on the circulatory system: Secondary | ICD-10-CM | POA: Diagnosis not present

## 2016-12-02 DIAGNOSIS — M62262 Nontraumatic ischemic infarction of muscle, left lower leg: Secondary | ICD-10-CM | POA: Diagnosis not present

## 2016-12-02 DIAGNOSIS — I739 Peripheral vascular disease, unspecified: Secondary | ICD-10-CM | POA: Diagnosis not present

## 2016-12-02 DIAGNOSIS — L97521 Non-pressure chronic ulcer of other part of left foot limited to breakdown of skin: Secondary | ICD-10-CM | POA: Diagnosis not present

## 2016-12-02 DIAGNOSIS — I10 Essential (primary) hypertension: Secondary | ICD-10-CM | POA: Diagnosis not present

## 2016-12-05 DIAGNOSIS — M62262 Nontraumatic ischemic infarction of muscle, left lower leg: Secondary | ICD-10-CM | POA: Diagnosis not present

## 2016-12-05 DIAGNOSIS — Z48812 Encounter for surgical aftercare following surgery on the circulatory system: Secondary | ICD-10-CM | POA: Diagnosis not present

## 2016-12-05 DIAGNOSIS — I10 Essential (primary) hypertension: Secondary | ICD-10-CM | POA: Diagnosis not present

## 2016-12-05 DIAGNOSIS — I739 Peripheral vascular disease, unspecified: Secondary | ICD-10-CM | POA: Diagnosis not present

## 2016-12-05 DIAGNOSIS — L97521 Non-pressure chronic ulcer of other part of left foot limited to breakdown of skin: Secondary | ICD-10-CM | POA: Diagnosis not present

## 2016-12-05 DIAGNOSIS — E1142 Type 2 diabetes mellitus with diabetic polyneuropathy: Secondary | ICD-10-CM | POA: Diagnosis not present

## 2016-12-07 ENCOUNTER — Ambulatory Visit (INDEPENDENT_AMBULATORY_CARE_PROVIDER_SITE_OTHER): Payer: Medicare Other | Admitting: Vascular Surgery

## 2016-12-07 ENCOUNTER — Other Ambulatory Visit: Payer: Self-pay | Admitting: *Deleted

## 2016-12-07 ENCOUNTER — Encounter: Payer: Self-pay | Admitting: Vascular Surgery

## 2016-12-07 VITALS — BP 131/73 | HR 107 | Temp 97.1°F | Resp 18 | Ht 67.0 in | Wt 194.0 lb

## 2016-12-07 DIAGNOSIS — I739 Peripheral vascular disease, unspecified: Secondary | ICD-10-CM | POA: Diagnosis not present

## 2016-12-07 DIAGNOSIS — L97521 Non-pressure chronic ulcer of other part of left foot limited to breakdown of skin: Secondary | ICD-10-CM | POA: Diagnosis not present

## 2016-12-07 DIAGNOSIS — Z48812 Encounter for surgical aftercare following surgery on the circulatory system: Secondary | ICD-10-CM | POA: Diagnosis not present

## 2016-12-07 DIAGNOSIS — M62262 Nontraumatic ischemic infarction of muscle, left lower leg: Secondary | ICD-10-CM | POA: Diagnosis not present

## 2016-12-07 DIAGNOSIS — I10 Essential (primary) hypertension: Secondary | ICD-10-CM | POA: Diagnosis not present

## 2016-12-07 DIAGNOSIS — E1142 Type 2 diabetes mellitus with diabetic polyneuropathy: Secondary | ICD-10-CM | POA: Diagnosis not present

## 2016-12-07 NOTE — Progress Notes (Signed)
Patient name: [name] MRN: [id number] DOB: [birth year] Sex: male  REASON FOR VISIT: Follow up of peripheral vascular disease.  HPI: [name] is a 65 y.o. male who underwent a left femoral to below knee popliteal artery bypass by Dr. Kellie Simmering with the vein graft. This occluded and he presented with a nonhealing wound on the dorsum of his left foot. His only option for revascularization was a femoral to peroneal artery bypass with a prosthetic graft. It was felt that this was his last remaining option for attempt at limb salvage.  On 11/11/2016, he underwent a left femoral to peroneal artery bypass with a 6 mm PTFE graft. He did well postoperatively and returns for his first postoperative visit. He has no specific complaints. The wounds on the dorsum of his left foot have improved significantly.  Current Outpatient Prescriptions  Medication Sig Dispense Refill  . acetaminophen (TYLENOL) 500 MG tablet Take 1,000 mg by mouth 3 (three) times daily as needed for moderate pain.    Marland Kitchen aspirin EC 81 MG tablet Take 162 mg by mouth daily.    . benazepril (LOTENSIN) 5 MG tablet Take 1 tablet (5 mg total) by mouth daily. 90 tablet 3  . cilostazol (PLETAL) 100 MG tablet Take 100 mg by mouth 2 (two) times daily.  5  . clopidogrel (PLAVIX) 75 MG tablet TAKE 1 TABLET EVERY DAY 90 tablet 3  . ezetimibe (ZETIA) 10 MG tablet Take 1 tablet (10 mg total) by mouth daily. 90 tablet 3  . gabapentin (NEURONTIN) 800 MG tablet TAKE 1.5 TABLETS (1,200 MG TOTAL) BY MOUTH 3 (THREE) TIMES DAILY. 135 tablet 11  . metFORMIN (GLUCOPHAGE) 500 MG tablet Take 500 mg by mouth daily.    . Nebivolol HCl (BYSTOLIC) 20 MG TABS Take 1 tablet (20 mg total) by mouth daily. 30 tablet 11  . pantoprazole (PROTONIX) 40 MG tablet Take 1 tablet (40 mg total) by mouth daily. 30 tablet 11  . cyclobenzaprine (FLEXERIL) 10 MG tablet Take 1 tablet (10 mg total) by mouth 3 (three) times daily as needed for muscle spasms. (Patient not taking:  Reported on 12/07/2016) 45 tablet 0  . docusate sodium (COLACE) 100 MG capsule Take 1 capsule (100 mg total) by mouth 2 (two) times daily. (Patient not taking: Reported on 12/07/2016) 60 capsule 1  . FLUoxetine (PROZAC) 10 MG capsule Take 1 capsule (10 mg total) by mouth daily. (Patient not taking: Reported on 12/07/2016)    . meloxicam (MOBIC) 15 MG tablet Take 1 tablet (15 mg total) by mouth daily. (Patient not taking: Reported on 12/07/2016) 14 tablet 0  . oxyCODONE-acetaminophen (PERCOCET/ROXICET) 5-325 MG tablet Take 1 tablet by mouth every 6 (six) hours as needed. (Patient not taking: Reported on 12/07/2016) 30 tablet 0   Current Facility-Administered Medications  Medication Dose Route Frequency Provider Last Rate Last Dose  . atorvastatin (LIPITOR) tablet 10 mg  10 mg Oral q1800 Alvia Grove, PA-C        REVIEW OF SYSTEMS:  [X]  denotes positive finding, [ ]  denotes negative finding Cardiac  Comments:  Chest pain or chest pressure:    Shortness of breath upon exertion:    Short of breath when lying flat:    Irregular heart rhythm:    Constitutional    Fever or chills:      PHYSICAL EXAM: Vitals:   12/07/16 1223  BP: 131/73  Pulse: (!) 107  Resp: 18  Temp: 97.1 F (36.2 C)  SpO2: 97%  Weight: 194 lb (88 kg)  Height: 5\' 7"  (1.702 m)    GENERAL: The patient is a well-nourished male, in no acute distress. The vital signs are documented above. CARDIOVASCULAR: There is a regular rate and rhythm. PULMONARY: There is good air exchange bilaterally without wheezing or rales. He has a brisk peroneal, anterior tibial, and posterior tibial signal on the left foot with the Doppler. The wounds on the dorsum of his left foot are healing. His incisions are all healing nicely. He does have some staples remaining from where we explored his great saphenous vein just above the ankle and we removed these in the office today.  MEDICAL ISSUES:  STATUS POST LEFT FEMORAL TO PERONEAL  ARTERY BYPASS WITH PTFE: The patient is doing well status post a left femoral to peroneal artery bypass. He had no vein for a composite graft. His bypass graft is patent with brisk Doppler signals in the left foot. The wounds on his foot are healing. I have encouraged him to stay as active as possible and to avoid smoking. I have explained that if we can keep his bypass graft open long enough to heal his wounds then even if the graft occludes in the future he would probably have adequate circulation to the left foot. We will follow his graft on our protocol. I have scheduled follow up studies in 3 months and I'll see him back at that time. He knows to call sooner if he has problems. Of note I did not put him on Coumadin because he has to be on Plavix. He is also on aspirin.  Deitra Mayo [facility] [place] [phone]

## 2016-12-08 DIAGNOSIS — E1142 Type 2 diabetes mellitus with diabetic polyneuropathy: Secondary | ICD-10-CM | POA: Diagnosis not present

## 2016-12-08 DIAGNOSIS — I10 Essential (primary) hypertension: Secondary | ICD-10-CM | POA: Diagnosis not present

## 2016-12-08 DIAGNOSIS — M62262 Nontraumatic ischemic infarction of muscle, left lower leg: Secondary | ICD-10-CM | POA: Diagnosis not present

## 2016-12-08 DIAGNOSIS — I739 Peripheral vascular disease, unspecified: Secondary | ICD-10-CM | POA: Diagnosis not present

## 2016-12-08 DIAGNOSIS — Z48812 Encounter for surgical aftercare following surgery on the circulatory system: Secondary | ICD-10-CM | POA: Diagnosis not present

## 2016-12-08 DIAGNOSIS — L97521 Non-pressure chronic ulcer of other part of left foot limited to breakdown of skin: Secondary | ICD-10-CM | POA: Diagnosis not present

## 2016-12-08 MED ORDER — FLUOXETINE HCL 10 MG PO CAPS: 10.0000 mg | ORAL_CAPSULE | Freq: Every day | ORAL | 3 refills | Status: DC

## 2016-12-08 MED ORDER — DOCUSATE SODIUM 100 MG PO CAPS: 100.0000 mg | ORAL_CAPSULE | Freq: Two times a day (BID) | ORAL | 2 refills | Status: DC

## 2016-12-08 MED ORDER — CYCLOBENZAPRINE HCL 10 MG PO TABS: 10.0000 mg | ORAL_TABLET | Freq: Three times a day (TID) | ORAL | 1 refills | Status: DC | PRN

## 2016-12-10 DIAGNOSIS — Z48812 Encounter for surgical aftercare following surgery on the circulatory system: Secondary | ICD-10-CM | POA: Diagnosis not present

## 2016-12-10 DIAGNOSIS — M62262 Nontraumatic ischemic infarction of muscle, left lower leg: Secondary | ICD-10-CM | POA: Diagnosis not present

## 2016-12-10 DIAGNOSIS — E1142 Type 2 diabetes mellitus with diabetic polyneuropathy: Secondary | ICD-10-CM | POA: Diagnosis not present

## 2016-12-10 DIAGNOSIS — I739 Peripheral vascular disease, unspecified: Secondary | ICD-10-CM | POA: Diagnosis not present

## 2016-12-10 DIAGNOSIS — L97521 Non-pressure chronic ulcer of other part of left foot limited to breakdown of skin: Secondary | ICD-10-CM | POA: Diagnosis not present

## 2016-12-10 DIAGNOSIS — I10 Essential (primary) hypertension: Secondary | ICD-10-CM | POA: Diagnosis not present

## 2016-12-12 DIAGNOSIS — Z48812 Encounter for surgical aftercare following surgery on the circulatory system: Secondary | ICD-10-CM | POA: Diagnosis not present

## 2016-12-12 DIAGNOSIS — E1142 Type 2 diabetes mellitus with diabetic polyneuropathy: Secondary | ICD-10-CM | POA: Diagnosis not present

## 2016-12-12 DIAGNOSIS — L97521 Non-pressure chronic ulcer of other part of left foot limited to breakdown of skin: Secondary | ICD-10-CM | POA: Diagnosis not present

## 2016-12-12 DIAGNOSIS — M62262 Nontraumatic ischemic infarction of muscle, left lower leg: Secondary | ICD-10-CM | POA: Diagnosis not present

## 2016-12-12 DIAGNOSIS — I10 Essential (primary) hypertension: Secondary | ICD-10-CM | POA: Diagnosis not present

## 2016-12-12 DIAGNOSIS — I739 Peripheral vascular disease, unspecified: Secondary | ICD-10-CM | POA: Diagnosis not present

## 2016-12-13 DIAGNOSIS — M62262 Nontraumatic ischemic infarction of muscle, left lower leg: Secondary | ICD-10-CM | POA: Diagnosis not present

## 2016-12-13 DIAGNOSIS — E1142 Type 2 diabetes mellitus with diabetic polyneuropathy: Secondary | ICD-10-CM | POA: Diagnosis not present

## 2016-12-13 DIAGNOSIS — L97521 Non-pressure chronic ulcer of other part of left foot limited to breakdown of skin: Secondary | ICD-10-CM | POA: Diagnosis not present

## 2016-12-13 DIAGNOSIS — I739 Peripheral vascular disease, unspecified: Secondary | ICD-10-CM | POA: Diagnosis not present

## 2016-12-13 DIAGNOSIS — I10 Essential (primary) hypertension: Secondary | ICD-10-CM | POA: Diagnosis not present

## 2016-12-13 DIAGNOSIS — Z48812 Encounter for surgical aftercare following surgery on the circulatory system: Secondary | ICD-10-CM | POA: Diagnosis not present

## 2016-12-13 NOTE — Addendum Note (Signed)
Addended by: Lianne Cure A on: 12/13/2016 09:41 AM   Modules accepted: Orders

## 2016-12-16 DIAGNOSIS — E1142 Type 2 diabetes mellitus with diabetic polyneuropathy: Secondary | ICD-10-CM | POA: Diagnosis not present

## 2016-12-16 DIAGNOSIS — Z48812 Encounter for surgical aftercare following surgery on the circulatory system: Secondary | ICD-10-CM | POA: Diagnosis not present

## 2016-12-16 DIAGNOSIS — I10 Essential (primary) hypertension: Secondary | ICD-10-CM | POA: Diagnosis not present

## 2016-12-16 DIAGNOSIS — M62262 Nontraumatic ischemic infarction of muscle, left lower leg: Secondary | ICD-10-CM | POA: Diagnosis not present

## 2016-12-16 DIAGNOSIS — L97521 Non-pressure chronic ulcer of other part of left foot limited to breakdown of skin: Secondary | ICD-10-CM | POA: Diagnosis not present

## 2016-12-16 DIAGNOSIS — I739 Peripheral vascular disease, unspecified: Secondary | ICD-10-CM | POA: Diagnosis not present

## 2016-12-21 ENCOUNTER — Telehealth: Payer: Self-pay | Admitting: Vascular Surgery

## 2016-12-21 DIAGNOSIS — M62262 Nontraumatic ischemic infarction of muscle, left lower leg: Secondary | ICD-10-CM | POA: Diagnosis not present

## 2016-12-21 DIAGNOSIS — Z48812 Encounter for surgical aftercare following surgery on the circulatory system: Secondary | ICD-10-CM | POA: Diagnosis not present

## 2016-12-21 DIAGNOSIS — E1142 Type 2 diabetes mellitus with diabetic polyneuropathy: Secondary | ICD-10-CM | POA: Diagnosis not present

## 2016-12-21 DIAGNOSIS — I739 Peripheral vascular disease, unspecified: Secondary | ICD-10-CM | POA: Diagnosis not present

## 2016-12-21 DIAGNOSIS — I10 Essential (primary) hypertension: Secondary | ICD-10-CM | POA: Diagnosis not present

## 2016-12-21 DIAGNOSIS — L97521 Non-pressure chronic ulcer of other part of left foot limited to breakdown of skin: Secondary | ICD-10-CM | POA: Diagnosis not present

## 2016-12-21 NOTE — Telephone Encounter (Signed)
Laurey Arrow, Physical Therapist from [facility], called and left voice message.  Says patient is doing very well and will be discharged from [facility]/therapy.  If we have any questions, he can be reached at [phone]. Ashleigh E., LPN.

## 2016-12-28 DIAGNOSIS — L97521 Non-pressure chronic ulcer of other part of left foot limited to breakdown of skin: Secondary | ICD-10-CM | POA: Diagnosis not present

## 2016-12-28 DIAGNOSIS — M62262 Nontraumatic ischemic infarction of muscle, left lower leg: Secondary | ICD-10-CM | POA: Diagnosis not present

## 2016-12-28 DIAGNOSIS — I739 Peripheral vascular disease, unspecified: Secondary | ICD-10-CM | POA: Diagnosis not present

## 2016-12-28 DIAGNOSIS — I10 Essential (primary) hypertension: Secondary | ICD-10-CM | POA: Diagnosis not present

## 2016-12-28 DIAGNOSIS — E1142 Type 2 diabetes mellitus with diabetic polyneuropathy: Secondary | ICD-10-CM | POA: Diagnosis not present

## 2016-12-28 DIAGNOSIS — Z48812 Encounter for surgical aftercare following surgery on the circulatory system: Secondary | ICD-10-CM | POA: Diagnosis not present

## 2017-01-03 ENCOUNTER — Other Ambulatory Visit: Payer: Self-pay | Admitting: Family Medicine

## 2017-01-03 NOTE — Telephone Encounter (Signed)
I gave patient 45 tabs with one refill less than a month ago. If he has gone through all 90 tabs since that visit then he needs an appointment.  However, I assume he just didn't realize the refill that Rx had

## 2017-01-16 ENCOUNTER — Other Ambulatory Visit: Payer: Self-pay | Admitting: Family Medicine

## 2017-02-10 ENCOUNTER — Encounter: Payer: Self-pay | Admitting: Family Medicine

## 2017-02-10 ENCOUNTER — Ambulatory Visit (INDEPENDENT_AMBULATORY_CARE_PROVIDER_SITE_OTHER): Payer: Medicare Other | Admitting: Family Medicine

## 2017-02-10 DIAGNOSIS — I739 Peripheral vascular disease, unspecified: Secondary | ICD-10-CM

## 2017-02-10 DIAGNOSIS — M5412 Radiculopathy, cervical region: Secondary | ICD-10-CM | POA: Diagnosis not present

## 2017-02-10 NOTE — Progress Notes (Signed)
66 yo male presents to [facility] for f/u for left lower leg pain, edema and dorsal foot wound 2/2 chronic venous insufficiency.   Acute concerns: No concerns today.   Left leg pain, edema, dorsal foot wound Patient reports significant improvement in his left leg pain after vascular surgery. Patient had a left femoral-peroneal artery bypass graft in 11/11/2016. Reports a pain level of around 4/10 compared to a 10/10 pain before surgery. Dorsal foot wound has closed up and shows no signs of erythema or drainage. Patient states that he is able to walk around longer without pain compared to before surgery. Has follow-up with Vascular surgery middle of next month.   Bilateral UE burning sensation Patient reports he has had a chronic burning sensation in bilateral upper extremities in the Triceps region. Has history of cervical stenosis, herniated disc and radiculopathy and s/p Anterior cervical decompression/discectomy, fusion and hardware removal in 2013. Is currently on Gabapentin 800 mg, meloxicam and percocet. Reports improvement with these medications. No weakness or numbness reported.   ROS: Gen: Denies fevers, chil, fatigue, weight loss Neuro: Denies tremors, weakness, Positive for burning sensation in bilateral upper extremity, denies syncope, dizziness HEENT: Negative for headache, blurry vision, ear discharge, nasal discharge, throat or dental pain  Cv: Negative for chest pain, orthopnea, palpitations  Pulm: Negative for cough, wheezing, sputum production  Gi: Negative for bloody stool, abdominal pain, diarrhea, constipation, vomiting  Renal: Negative for hematuria, urgency, hesitancy  Skin: Negative for rash.   Physical exam: Gen: Pleasant male. NAD.  Neuro: Alert and interacting appropriately.  HEENT: Normocephalic. PERRLA. EOMI. No corneal clouding. TM grey without erythema, bulging or retraction. No nasal discharge. Oropharynx clear with erythema or exudate. No LAD. Trachea midline. No  thyromegaly.  Cv: Regular rate and rhythm. No murmurs.  Pulm; Lungs CTAB. Distant lung sounds. No rhonchi, rales, or wheezes.  Abd: Soft and non-tender.  Ext: Warm and perfused. 2+ edema in Left lower extremity. Discoloration consistent with chronic venous insufficiency. No wounds or skin breakdown present on bilateral lower extremity. Dorsal foot wound fully closed. No drainage or erythema. 2+ DP and PT pulses present bilateral LE Strength 5/5 Upper and lower extremities. Sensation to fine touch intact in upper and lower extremities.  Assessment and Plan:  Left lower leg pain and swelling Recommend elevating the leg daily. Has follow-up with vascular surgeon in middle of next month but recommended contacting them earlier to talk about use of compression stockings.  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 Will continue to follow for any worsening or progression of symptoms. Consider MRI at that time.

## 2017-02-10 NOTE — Patient Instructions (Signed)
It was a pleasure seeing you today in our clinic. Today we discussed your lower leg swelling and blood pressure. Here is the treatment plan we have discussed and agreed upon together:   - I would like for you to discuss with your vascular surgeon the possibility of "compression hose" to help with the swelling in your left leg. Please discuss this with him at your next visit or if the discomfort is significant enough call his office to ask for his/her opinion on the matter. - Continue taking your medications as prescribed. I feel much better knowing that your blood pressure medications are being well tolerated and seem to be keeping your blood pressure at a reasonable level. - I would like to see her back in about 3 months, around May. At that time we will repeat your A1c. Feel free to schedule an appointment any sooner if you need.

## 2017-02-11 DIAGNOSIS — M5412 Radiculopathy, cervical region: Secondary | ICD-10-CM | POA: Insufficient documentation

## 2017-02-11 HISTORY — DX: Radiculopathy, cervical region: M54.12

## 2017-02-11 NOTE — Assessment & Plan Note (Signed)
Patient has had a fantastic response to revascularization of his lower extremity. Pain has improved significantly and patient states that he is able to ambulate to a greater degree now that this pain has improved. Swelling of the left leg is not overly surprising but may cause issues down the road. I have asked that he discuss this with his vascular surgeon who he will be seeing again in the coming months. - Elevate legs while seated or laying. - I have asked patient to discuss the possibility of compression stockings with his vascular surgeon >> at this time I believe it is appropriate, however with some of the issues he had with healing at his surgical sites I do not want to risk causing any issues or dehiscence without first running that by vascular surgery.

## 2017-02-11 NOTE — Assessment & Plan Note (Signed)
Patient endorses some bilateral "burning" along his lateral and posterior aspects of his upper arms. Sensation did not go beyond the elbow. Findings are consistent with cervical radiculopathy. Patient states that he has had cervical spine surgery in the past. Symptoms are relatively new and are not accompanied with any weakness or numbness. At this time I will watch/monitor patient is agreeable with this plan. He is to contact me if these symptoms worsen.

## 2017-02-15 ENCOUNTER — Telehealth: Payer: Self-pay | Admitting: Family Medicine

## 2017-02-15 NOTE — Telephone Encounter (Signed)
Scheduled AWV 02/21/17 at 11am [facility]

## 2017-02-20 ENCOUNTER — Other Ambulatory Visit: Payer: Self-pay | Admitting: Internal Medicine

## 2017-02-21 ENCOUNTER — Ambulatory Visit (INDEPENDENT_AMBULATORY_CARE_PROVIDER_SITE_OTHER): Payer: Medicare Other | Admitting: *Deleted

## 2017-02-21 ENCOUNTER — Encounter: Payer: Self-pay | Admitting: *Deleted

## 2017-02-21 VITALS — BP 132/76 | HR 77 | Temp 98.1°F | Ht 67.0 in | Wt 209.0 lb

## 2017-02-21 DIAGNOSIS — Z0000 Encounter for general adult medical examination without abnormal findings: Secondary | ICD-10-CM | POA: Diagnosis not present

## 2017-02-21 DIAGNOSIS — Z1159 Encounter for screening for other viral diseases: Secondary | ICD-10-CM

## 2017-02-21 NOTE — Progress Notes (Signed)
Subjective:   [name] is a 66 y.o. male who presents for an Initial Medicare Annual Wellness Visit.  Cardiac Risk Factors include: advanced age (>88men, >78 women);diabetes mellitus;dyslipidemia;hypertension;male gender;obesity (BMI >30kg/m2);smoking/ tobacco exposure    Objective:    Today's Vitals   02/21/17 1109 02/21/17 1111  BP:  132/76  Pulse:  77  Temp:  98.1 F (36.7 C)  TempSrc:  Oral  SpO2:  97%  Weight: 209 lb (94.8 kg)   Height: 5\' 7"  (1.702 m)   PainSc:  7   PainLoc:  Leg   Body mass index is 32.73 kg/m.  Current Medications (verified) Outpatient Encounter Prescriptions as of 02/21/2017  Medication Sig  . acetaminophen (TYLENOL) 500 MG tablet Take 1,000 mg by mouth 3 (three) times daily as needed for moderate pain.  Marland Kitchen aspirin EC 81 MG tablet Take 162 mg by mouth daily.  . benazepril (LOTENSIN) 5 MG tablet Take 1 tablet (5 mg total) by mouth daily.  . cilostazol (PLETAL) 100 MG tablet Take 100 mg by mouth 2 (two) times daily.  . clopidogrel (PLAVIX) 75 MG tablet TAKE 1 TABLET EVERY DAY  . docusate sodium (COLACE) 100 MG capsule Take 1 capsule (100 mg total) by mouth 2 (two) times daily.  Marland Kitchen ezetimibe (ZETIA) 10 MG tablet Take 1 tablet (10 mg total) by mouth daily.  Marland Kitchen FLUoxetine (PROZAC) 10 MG capsule Take 1 capsule (10 mg total) by mouth daily.  Marland Kitchen gabapentin (NEURONTIN) 800 MG tablet TAKE 1.5 TABLETS (1,200 MG TOTAL) BY MOUTH 3 (THREE) TIMES DAILY.  . meloxicam (MOBIC) 15 MG tablet Take 1 tablet (15 mg total) by mouth daily.  . metFORMIN (GLUCOPHAGE) 500 MG tablet Take 500 mg by mouth daily.  . Nebivolol HCl (BYSTOLIC) 20 MG TABS Take 1 tablet (20 mg total) by mouth daily.  . pantoprazole (PROTONIX) 40 MG tablet Take 1 tablet (40 mg total) by mouth daily.  . cyclobenzaprine (FLEXERIL) 10 MG tablet TAKE ONE (1) TABLET BY MOUTH THREE TIMES DAILY AS NEEDED FOR MUSCLE SPASMS (Patient not taking: Reported on 02/21/2017)  . oxyCODONE-acetaminophen  (PERCOCET/ROXICET) 5-325 MG tablet Take 1 tablet by mouth every 6 (six) hours as needed. (Patient not taking: Reported on 12/07/2016)   Facility-Administered Encounter Medications as of 02/21/2017  Medication  . atorvastatin (LIPITOR) tablet 10 mg    Allergies (verified) Glipizide   History: Past Medical History:  Diagnosis Date  . Anemia   . Arthritis    OA  . Cervical radiculopathy    Dr. Vertell Limber neurosurgery  . Chronic lower back pain   . Diabetes mellitus    takes Metformin daily  . GERD (gastroesophageal reflux disease)    takes Protonix daily  . History of blood transfusion    "related to low HgB" ((09/10/2015  . Hyperlipidemia    takes Vytorin daily  . Hypertension    takes Benazepril and Bystolic daily  . PAD (peripheral artery disease) ([place])   . Pneumonia   . Shortness of breath dyspnea    with exertion  . Tobacco user   . Toe fracture, right 05/09/2011   Past Surgical History:  Procedure Laterality Date  . ANTERIOR CERVICAL DECOMP/DISCECTOMY FUSION  03/08/12   C6-7  . ANTERIOR CERVICAL DECOMP/DISCECTOMY FUSION  03/08/2012   Procedure: ANTERIOR CERVICAL DECOMPRESSION/DISCECTOMY FUSION 1 LEVEL/HARDWARE REMOVAL;  Surgeon: Erline Levine, MD;  Location: [place] [facility];  Service: Neurosurgery;  Laterality: N/A;  revison of C5-7 anterior cervical decompression with fusion with Cervical Five-Thoracic  One anterior cervical decompression with fusion with interbody prothesis plating and bonegraft  . BACK SURGERY  1996  . BYPASS GRAFT FEMORAL-PERONEAL Left 11/11/2016   Procedure: REDO LEFT FEMORAL-PERONEAL BYPASS WITH PROPATEN 6MM X 80CM GRAFT;  Surgeon: Angelia Mould, MD;  Location: [facility] OR;  Service: Vascular;  Laterality: Left;  . COLONOSCOPY W/ BIOPSIES AND POLYPECTOMY  08/17/2012   f/u 5 years, 4 polyps, no high grade dysplasia or malignancy, tubular adenoma, hyperplastic polyops  . ENTEROSCOPY N/A 12/11/2015   Procedure: ENTEROSCOPY;  Surgeon: Carol Ada, MD;   Location: [facility] ENDOSCOPY;  Service: Endoscopy;  Laterality: N/A;  . ESOPHAGOGASTRODUODENOSCOPY  08/17/2012   normal esophagus and GEJ, diffuse gastritis with erythema- no malignancy, reactive gastropathy  with focal intestinal metaplasia  . FEMORAL-POPLITEAL BYPASS GRAFT Left 01/06/2016   Procedure: Left  COMMON FEMORAL-BELOW KNEE POPLITEAL ARTERY Bypass using non-reversed translocated saphenous vein graft from left leg;  Surgeon: Mal Misty, MD;  Location: [place];  Service: Vascular;  Laterality: Left;  . GIVENS CAPSULE STUDY N/A 11/24/2015   Procedure: GIVENS CAPSULE STUDY;  Surgeon: Juanita Craver, MD;  Location: [place];  Service: Endoscopy;  Laterality: N/A;  . INGUINAL HERNIA REPAIR  1990's   right  . INTRAOPERATIVE ARTERIOGRAM Left 01/06/2016   Procedure: INTRA OPERATIVE ARTERIOGRAM LEFT LOWER LEG;  Surgeon: Mal Misty, MD;  Location: [place];  Service: Vascular;  Laterality: Left;  . INTRAOPERATIVE ARTERIOGRAM Left 11/11/2016   Procedure: INTRA OPERATIVE ARTERIOGRAM LEFT LOWER EXTRIMITY;  Surgeon: Angelia Mould, MD;  Location: [place];  Service: Vascular;  Laterality: Left;  . IR GENERIC HISTORICAL  10/24/2016   IR ANGIOGRAM FOLLOW UP STUDY  . LOWER EXTREMITY ANGIOGRAM Bilateral 07/30/2015   Procedure: Lower Extremity Angiogram;  Surgeon: Conrad Englewood, MD;  Location: [place] CV LAB;  Service: Cardiovascular;  Laterality: Bilateral;  . [place]   "lower"  . PERIPHERAL VASCULAR CATHETERIZATION N/A 07/30/2015   Procedure: Abdominal Aortogram;  Surgeon: Conrad [place], MD;  Location: [place] CV LAB;  Service: Cardiovascular;  Laterality: N/A;  . PERIPHERAL VASCULAR CATHETERIZATION N/A 10/24/2016   Procedure: Abdominal Aortogram;  Surgeon: Angelia Mould, MD;  Location: [place] CV LAB;  Service: Cardiovascular;  Laterality: N/A;  . PERIPHERAL VASCULAR CATHETERIZATION N/A 10/24/2016   Procedure: Lower Extremity Angiography;  Surgeon: Angelia Mould, MD;  Location: [place] CV LAB;  Service: Cardiovascular;  Laterality: N/A;  . VASCULAR SURGERY  ~ 2007   Stent SFA   . VEIN HARVEST Left 01/06/2016   Procedure: LEFT GREATER [place];  Surgeon: Mal Misty, MD;  Location: [facility] OR;  Service: Vascular;  Laterality: Left;   Family History  Problem Relation Age of Onset  . Cancer Mother     colon Cancer  . Hyperlipidemia Mother   . Diabetes Mother   . Heart disease Father   . Hypertension Father   . Cancer Sister     Uterine  . Diabetes Sister   . Diabetes Brother    Social History   Occupational History  . Not on file.   Social History Main Topics  . Smoking status: Current Some Day Smoker    Years: 24.00    Types: Cigars  . Smokeless tobacco: Never Used     Comment: smokes 1-2 cigars per day   . Alcohol use 1.2 oz/week    2 Cans of beer per week  . Drug use: No  . Sexual activity: Yes  Tobacco Counseling Ready to quit: No Counseling given: Yes Patient smoking 1-2 cigars daily. Information given on 1-800-QUIT-NOW  Activities of Daily Living In your present state of health, do you have any difficulty performing the following activities: 02/21/2017 11/12/2016  Hearing? Y N  Vision? Y N  Difficulty concentrating or making decisions? Y N  Walking or climbing stairs? Y Y  Dressing or bathing? N N  Doing errands, shopping? N N  Preparing Food and eating ? N -  Using the Toilet? N -  In the past six months, have you accidently leaked urine? Y -  Do you have problems with loss of bowel control? N -  Managing your Medications? N -  Managing your Finances? N -  Housekeeping or managing your Housekeeping? N -  Some recent data might be hidden   Home Safety:  My home has a working smoke alarm:  Yes X2           My home throw rugs have been fastened down to the floor or removed:  Removed I have non-slip mats in the bathtub and shower:  Yes         All my home's stairs have railings or  bannisters: One level home with 2 outside stairs with hand rails           My home's floors, stairs and hallways are free from clutter, wires and cords:  Yes     I wear seatbelts consistently:  Yes   Immunizations and Health Maintenance Immunization History  Administered Date(s) Administered  . Influenza Split 11/30/2011, 11/07/2012  . Influenza Whole 10/31/2007, 10/23/2008, 11/11/2009, 10/01/2010  . Influenza,inj,Quad PF,36+ Mos 08/27/2014, 09/11/2015, 09/13/2016  . Influenza-Unspecified 09/25/2013  . Pneumococcal Conjugate-13 09/13/2016  . Pneumococcal Polysaccharide-23 10/01/2010  . Td 12/26/2001  . Tdap 01/18/2012   Health Maintenance Due  Topic Date Due  . Hepatitis C Screening  [birth year]  . OPHTHALMOLOGY EXAM  05/07/2013  Hep C drawn today Patient will schedule appt for eye exam. List of doctors who take Medicare given to patient.  Patient Care Team: Elberta Leatherwood, MD as PCP - General (Family Medicine) Carol Ada, MD as Consulting Physician (Gastroenterology) Juanita Craver, MD as Consulting Physician (Gastroenterology)  Indicate any recent Medical Services you may have received from other than [facility] providers in the past year (date may be approximate).    Assessment:   This is a routine wellness examination for [place].   Hearing/Vision screen  Hearing Screening   Method: Audiometry   125Hz  250Hz  500Hz  1000Hz  2000Hz  3000Hz  4000Hz  6000Hz  8000Hz   Right ear:   40 40 Fail Fail     Left ear:   40 40 Fail Fail       Dietary issues and exercise activities discussed: Current Exercise Habits: Home exercise routine, Type of exercise: stretching;walking, Time (Minutes): 30, Frequency (Times/Week): 4, Weekly Exercise (Minutes/Week): 120, Intensity: Moderate  Goals    . HEMOGLOBIN A1C < 7.0    . Increase water intake    . Reduce sugar intake to X grams per day          Dilute sweet tea with half unsweet      Depression Screen PHQ 2/9 Scores 02/21/2017 02/10/2017 12/01/2016  10/10/2016  PHQ - 2 Score 0 0 0 5  PHQ- 9 Score - - 0 16    Fall Risk Fall Risk  02/21/2017 12/01/2016 10/10/2016 07/21/2016 06/24/2016  Falls in the past year? Yes No Yes No Yes  Number falls in past  yr: 2 or more - - - 2 or more  Injury with Fall? Yes - - - No  Risk Factor Category  High Fall Risk - - - -  Risk for fall due to : History of fall(s);Impaired balance/gait;Impaired mobility - - - -  Follow up Falls evaluation completed;Education provided;Falls prevention discussed - - - -   TUG Test:  Done in 17 seconds. Ambulated with cane. Patient used both hands to push out of chair and one hand to sit back down. PCP notified of High Fall Risk  Cognitive Function: Mini-Cog  Passed with score 4/5   Screening Tests Health Maintenance  Topic Date Due  . Hepatitis C Screening  29-May-1951  . OPHTHALMOLOGY EXAM  05/07/2013  . HEMOGLOBIN A1C  05/12/2017  . COLONOSCOPY  08/17/2017  . PNA vac Low Risk Adult (2 of 2 - PPSV23) 09/13/2017  . FOOT EXAM  10/03/2017  . TETANUS/TDAP  01/17/2022  . INFLUENZA VACCINE  Completed        Plan:   Hep C drawn today Patient will schedule appt for eye exam. List of doctors who take Medicare given to patient.   During the course of the visit Micky was educated and counseled about the following appropriate screening and preventive services:   Vaccines to include Pneumoccal, Influenza, Td, Zostavax/Shingrix  Colorectal cancer screening  Cardiovascular disease screening  Diabetes screening  Glaucoma screening  Nutrition counseling  Smoking cessation counseling  Patient Instructions (the written plan) were given to the patient.   Velora Heckler, RN   02/21/2017

## 2017-02-21 NOTE — Patient Instructions (Signed)
 Diabetes and Foot Care Diabetes may cause you to have problems because of poor blood supply (circulation) to your feet and legs. This may cause the skin on your feet to become thinner, break easier, and heal more slowly. Your skin may become dry, and the skin may peel and crack. You may also have nerve damage in your legs and feet causing decreased feeling in them. You may not notice minor injuries to your feet that could lead to infections or more serious problems. Taking care of your feet is one of the most important things you can do for yourself. Follow these instructions at home:  Wear shoes at all times, even in the house. Do not go barefoot. Bare feet are easily injured.  Check your feet daily for blisters, cuts, and redness. If you cannot see the bottom of your feet, use a mirror or ask someone for help.  Wash your feet with warm water (do not use hot water) and mild soap. Then pat your feet and the areas between your toes until they are completely dry. Do not soak your feet as this can dry your skin.  Apply a moisturizing lotion or petroleum jelly (that does not contain alcohol and is unscented) to the skin on your feet and to dry, brittle toenails. Do not apply lotion between your toes.  Trim your toenails straight across. Do not dig under them or around the cuticle. File the edges of your nails with an emery board or nail file.  Do not cut corns or calluses or try to remove them with medicine.  Wear clean socks or stockings every day. Make sure they are not too tight. Do not wear knee-high stockings since they may decrease blood flow to your legs.  Wear shoes that fit properly and have enough cushioning. To break in new shoes, wear them for just a few hours a day. This prevents you from injuring your feet. Always look in your shoes before you put them on to be sure there are no objects inside.  Do not cross your legs. This may decrease the blood flow to your feet.  If you find a  minor scrape, cut, or break in the skin on your feet, keep it and the skin around it clean and dry. These areas may be cleansed with mild soap and water. Do not cleanse the area with peroxide, alcohol, or iodine.  When you remove an adhesive bandage, be sure not to damage the skin around it.  If you have a wound, look at it several times a day to make sure it is healing.  Do not use heating pads or hot water bottles. They may burn your skin. If you have lost feeling in your feet or legs, you may not know it is happening until it is too late.  Make sure your health care provider performs a complete foot exam at least annually or more often if you have foot problems. Report any cuts, sores, or bruises to your health care provider immediately. Contact a health care provider if:  You have an injury that is not healing.  You have cuts or breaks in the skin.  You have an ingrown nail.  You notice redness on your legs or feet.  You feel burning or tingling in your legs or feet.  You have pain or cramps in your legs and feet.  Your legs or feet are numb.  Your feet always feel cold. Get help right away if:  There is   increasing redness, swelling, or pain in or around a wound.  There is a red line that goes up your leg.  Pus is coming from a wound.  You develop a fever or as directed by your health care provider.  You notice a bad smell coming from an ulcer or wound. This information is not intended to replace advice given to you by your health care provider. Make sure you discuss any questions you have with your health care provider. Document Released: 12/09/2000 Document Revised: 05/19/2016 Document Reviewed: 05/21/2013 Elsevier Interactive Patient Education  2017 [place] Prevention in the Home Falls can cause injuries. They can happen to people of all ages. There are many things you can do to make your home safe and to help prevent falls. What can I do on the  outside of my home?  Regularly fix the edges of walkways and driveways and fix any cracks.  Remove anything that might make you trip as you walk through a door, such as a raised step or threshold.  Trim any bushes or trees on the path to your home.  Use bright outdoor lighting.  Clear any walking paths of anything that might make someone trip, such as rocks or tools.  Regularly check to see if handrails are loose or broken. Make sure that both sides of any steps have handrails.  Any raised decks and porches should have guardrails on the edges.  Have any leaves, snow, or ice cleared regularly.  Use sand or salt on walking paths during winter.  Clean up any spills in your garage right away. This includes oil or grease spills. What can I do in the bathroom?  Use night lights.  Install grab bars by the toilet and in the tub and shower. Do not use towel bars as grab bars.  Use non-skid mats or decals in the tub or shower.  If you need to sit down in the shower, use a plastic, non-slip stool.  Keep the floor dry. Clean up any water that spills on the floor as soon as it happens.  Remove soap buildup in the tub or shower regularly.  Attach bath mats securely with double-sided non-slip rug tape.  Do not have throw rugs and other things on the floor that can make you trip. What can I do in the bedroom?  Use night lights.  Make sure that you have a light by your bed that is easy to reach.  Do not use any sheets or blankets that are too big for your bed. They should not hang down onto the floor.  Have a firm chair that has side arms. You can use this for support while you get dressed.  Do not have throw rugs and other things on the floor that can make you trip. What can I do in the kitchen?  Clean up any spills right away.  Avoid walking on wet floors.  Keep items that you use a lot in easy-to-reach places.  If you need to reach something above you, use a strong step  stool that has a grab bar.  Keep electrical cords out of the way.  Do not use floor polish or wax that makes floors slippery. If you must use wax, use non-skid floor wax.  Do not have throw rugs and other things on the floor that can make you trip. What can I do with my stairs?  Do not leave any items on the stairs.  Make sure that there  are handrails on both sides of the stairs and use them. Fix handrails that are broken or loose. Make sure that handrails are as long as the stairways.  Check any carpeting to make sure that it is firmly attached to the stairs. Fix any carpet that is loose or worn.  Avoid having throw rugs at the top or bottom of the stairs. If you do have throw rugs, attach them to the floor with carpet tape.  Make sure that you have a light switch at the top of the stairs and the bottom of the stairs. If you do not have them, ask someone to add them for you. What else can I do to help prevent falls?  Wear shoes that:  Do not have high heels.  Have rubber bottoms.  Are comfortable and fit you well.  Are closed at the toe. Do not wear sandals.  If you use a stepladder:  Make sure that it is fully opened. Do not climb a closed stepladder.  Make sure that both sides of the stepladder are locked into place.  Ask someone to hold it for you, if possible.  Clearly mark and make sure that you can see:  Any grab bars or handrails.  First and last steps.  Where the edge of each step is.  Use tools that help you move around (mobility aids) if they are needed. These include:  Canes.  Walkers.  Scooters.  Crutches.  Turn on the lights when you go into a dark area. Replace any light bulbs as soon as they burn out.  Set up your furniture so you have a clear path. Avoid moving your furniture around.  If any of your floors are uneven, fix them.  If there are any pets around you, be aware of where they are.  Review your medicines with your doctor. Some  medicines can make you feel dizzy. This can increase your chance of falling. Ask your doctor what other things that you can do to help prevent falls. This information is not intended to replace advice given to you by your health care provider. Make sure you discuss any questions you have with your health care provider. Document Released: 10/08/2009 Document Revised: 05/19/2016 Document Reviewed: 01/16/2015 Elsevier Interactive Patient Education  2017 [place] Maintenance, Male A healthy lifestyle and preventive care is important for your health and wellness. Ask your health care provider about what schedule of regular examinations is right for you. What should I know about weight and diet?  Eat a Healthy Diet  Eat plenty of vegetables, fruits, whole grains, low-fat dairy products, and lean protein.  Do not eat a lot of foods high in solid fats, added sugars, or salt. Maintain a Healthy Weight  Regular exercise can help you achieve or maintain a healthy weight. You should:  Do at least 150 minutes of exercise each week. The exercise should increase your heart rate and make you sweat (moderate-intensity exercise).  Do strength-training exercises at least twice a week. Watch Your Levels of Cholesterol and Blood Lipids  Have your blood tested for lipids and cholesterol every 5 years starting at 67 years of age. If you are at high risk for heart disease, you should start having your blood tested when you are 67 years old. You may need to have your cholesterol levels checked more often if:  Your lipid or cholesterol levels are high.  You are older than 67 years of age.  You are at high  risk for heart disease. What should I know about cancer screening? Many types of cancers can be detected early and may often be prevented. Lung Cancer  You should be screened every year for lung cancer if:  You are a current smoker who has smoked for at least 30 years.  You are a former  smoker who has quit within the past 15 years.  Talk to your health care provider about your screening options, when you should start screening, and how often you should be screened. Colorectal Cancer  Routine colorectal cancer screening usually begins at 67 years of age and should be repeated every 5-10 years until you are 67 years old. You may need to be screened more often if early forms of precancerous polyps or small growths are found. Your health care provider may recommend screening at an earlier age if you have risk factors for colon cancer.  Your health care provider may recommend using home test kits to check for hidden blood in the stool.  A small camera at the end of a tube can be used to examine your colon (sigmoidoscopy or colonoscopy). This checks for the earliest forms of colorectal cancer. Prostate and Testicular Cancer  Depending on your age and overall health, your health care provider may do certain tests to screen for prostate and testicular cancer.  Talk to your health care provider about any symptoms or concerns you have about testicular or prostate cancer. Skin Cancer  Check your skin from head to toe regularly.  Tell your health care provider about any new moles or changes in moles, especially if:  There is a change in a mole's size, shape, or color.  You have a mole that is larger than a pencil eraser.  Always use sunscreen. Apply sunscreen liberally and repeat throughout the day.  Protect yourself by wearing long sleeves, pants, a wide-brimmed hat, and sunglasses when outside. What should I know about heart disease, diabetes, and high blood pressure?  If you are 17-58 years of age, have your blood pressure checked every 3-5 years. If you are 46 years of age or older, have your blood pressure checked every year. You should have your blood pressure measured twice-once when you are at a hospital or clinic, and once when you are not at a hospital or clinic. Record  the average of the two measurements. To check your blood pressure when you are not at a hospital or clinic, you can use:  An automated blood pressure machine at a pharmacy.  A home blood pressure monitor.  Talk to your health care provider about your target blood pressure.  If you are between 34-39 years old, ask your health care provider if you should take aspirin to prevent heart disease.  Have regular diabetes screenings by checking your fasting blood sugar level.  If you are at a normal weight and have a low risk for diabetes, have this test once every three years after the age of 80.  If you are overweight and have a high risk for diabetes, consider being tested at a younger age or more often.  A one-time screening for abdominal aortic aneurysm (AAA) by ultrasound is recommended for men aged 38-75 years who are current or former smokers. What should I know about preventing infection? Hepatitis B  If you have a higher risk for hepatitis B, you should be screened for this virus. Talk with your health care provider to find out if you are at risk for hepatitis B infection.  Hepatitis C  Blood testing is recommended for:  Everyone born from [birth year] through 1966.  Anyone with known risk factors for hepatitis C. Sexually Transmitted Diseases (STDs)  You should be screened each year for STDs including gonorrhea and chlamydia if:  You are sexually active and are younger than 67 years of age.  You are older than 67 years of age and your health care provider tells you that you are at risk for this type of infection.  Your sexual activity has changed since you were last screened and you are at an increased risk for chlamydia or gonorrhea. Ask your health care provider if you are at risk.  Talk with your health care provider about whether you are at high risk of being infected with HIV. Your health care provider may recommend a prescription medicine to help prevent HIV infection. What else  can I do?  Schedule regular health, dental, and eye exams.  Stay current with your vaccines (immunizations).  Do not use any tobacco products, such as cigarettes, chewing tobacco, and e-cigarettes. If you need help quitting, ask your health care provider.  Limit alcohol intake to no more than 2 drinks per day. One drink equals 12 ounces of beer, 5 ounces of wine, or 1 ounces of hard liquor.  Do not use street drugs.  Do not share needles.  Ask your health care provider for help if you need support or information about quitting drugs.  Tell your health care provider if you often feel depressed.  Tell your health care provider if you have ever been abused or do not feel safe at home. This information is not intended to replace advice given to you by your health care provider. Make sure you discuss any questions you have with your health care provider. Document Released: 06/09/2008 Document Revised: 08/10/2016 Document Reviewed: 09/15/2015 Elsevier Interactive Patient Education  2017 [employer]   Hearing Loss Hearing loss is a partial or total loss of the ability to hear. This can be temporary or permanent, and it can happen in one or both ears. Hearing loss may be referred to as deafness. Medical care is necessary to treat hearing loss properly and to prevent the condition from getting worse. Your hearing may partially or completely come back, depending on what caused your hearing loss and how severe it is. In some cases, hearing loss is permanent. What are the causes? Common causes of hearing loss include:  Too much wax in the ear canal.  Infection of the ear canal or middle ear.  Fluid in the middle ear.  Injury to the ear or surrounding area.  An object stuck in the ear.  Prolonged exposure to loud sounds, such as music. Less common causes of hearing loss include:  Tumors in the ear.  Viral or bacterial infections, such as meningitis.  A hole in the eardrum  (perforated eardrum).  Problems with the hearing nerve that sends signals between the brain and the ear.  Certain medicines. What are the signs or symptoms? Symptoms of this condition may include:  Difficulty telling the difference between sounds.  Difficulty following a conversation when there is background noise.  Lack of response to sounds in your environment. This may be most noticeable when you do not respond to startling sounds.  Needing to turn up the volume on the television, radio, etc.  Ringing in the ears.  Dizziness.  Pain in the ears. How is this diagnosed? This condition is diagnosed based on a physical exam  and a hearing test (audiometry). The audiometry test will be performed by a hearing specialist (audiologist). You may also be referred to an ear, nose, and throat (ENT) specialist (otolaryngologist). How is this treated? Treatment for recent onset of hearing loss may include:  Ear wax removal.  Being prescribed medicines to prevent infection (antibiotics).  Being prescribed medicines to reduce inflammation (corticosteroids). Follow these instructions at home:  If you were prescribed an antibiotic medicine, take it as told by your health care provider. Do not stop taking the antibiotic even if you start to feel better.  Take over-the-counter and prescription medicines only as told by your health care provider.  Avoid loud noises.  Return to your normal activities as told by your health care provider. Ask your health care provider what activities are safe for you.  Keep all follow-up visits as told by your health care provider. This is important. Contact a health care provider if:  You feel dizzy.  You develop new symptoms.  You vomit or feel nauseous.  You have a fever. Get help right away if:  You develop sudden changes in your vision.  You have severe ear pain.  You have new or increased weakness.  You have a severe headache. This  information is not intended to replace advice given to you by your health care provider. Make sure you discuss any questions you have with your health care provider. Document Released: 12/12/2005 Document Revised: 05/19/2016 Document Reviewed: 04/29/2015 Elsevier Interactive Patient Education  2017 [employer].   Steps to Quit Smoking Smoking tobacco can be bad for your health. It can also affect almost every organ in your body. Smoking puts you and people around you at risk for many serious long-lasting (chronic) diseases. Quitting smoking is hard, but it is one of the best things that you can do for your health. It is never too late to quit. What are the benefits of quitting smoking? When you quit smoking, you lower your risk for getting serious diseases and conditions. They can include:  Lung cancer or lung disease.  Heart disease.  Stroke.  Heart attack.  Not being able to have children (infertility).  Weak bones (osteoporosis) and broken bones (fractures). If you have coughing, wheezing, and shortness of breath, those symptoms may get better when you quit. You may also get sick less often. If you are pregnant, quitting smoking can help to lower your chances of having a baby of low birth weight. What can I do to help me quit smoking? Talk with your doctor about what can help you quit smoking. Some things you can do (strategies) include:  Quitting smoking totally, instead of slowly cutting back how much you smoke over a period of time.  Going to in-person counseling. You are more likely to quit if you go to many counseling sessions.  Using resources and support systems, such as:  Online chats with a Social worker.  Phone quitlines.  Printed Furniture conservator/restorer.  Support groups or group counseling.  Text messaging programs.  Mobile phone apps or applications.  Taking medicines. Some of these medicines may have nicotine in them. If you are pregnant or breastfeeding, do not  take any medicines to quit smoking unless your doctor says it is okay. Talk with your doctor about counseling or other things that can help you. Talk with your doctor about using more than one strategy at the same time, such as taking medicines while you are also going to in-person counseling. This can help make  quitting easier. What things can I do to make it easier to quit? Quitting smoking might feel very hard at first, but there is a lot that you can do to make it easier. Take these steps:  Talk to your family and friends. Ask them to support and encourage you.  Call phone quitlines, reach out to support groups, or work with a Social worker.  Ask people who smoke to not smoke around you.  Avoid places that make you want (trigger) to smoke, such as:  Bars.  Parties.  Smoke-break areas at work.  Spend time with people who do not smoke.  Lower the stress in your life. Stress can make you want to smoke. Try these things to help your stress:  Getting regular exercise.  Deep-breathing exercises.  Yoga.  Meditating.  Doing a body scan. To do this, close your eyes, focus on one area of your body at a time from head to toe, and notice which parts of your body are tense. Try to relax the muscles in those areas.  Download or buy apps on your mobile phone or tablet that can help you stick to your quit plan. There are many free apps, such as QuitGuide from the [employer] Office manager for Disease Control and Prevention). You can find more support from smokefree.gov and other websites. This information is not intended to replace advice given to you by your health care provider. Make sure you discuss any questions you have with your health care provider. Document Released: 10/08/2009 Document Revised: 08/09/2016 Document Reviewed: 04/28/2015 Elsevier Interactive Patient Education  2017 [employer].

## 2017-02-22 ENCOUNTER — Encounter: Payer: Self-pay | Admitting: *Deleted

## 2017-02-22 LAB — HEPATITIS C ANTIBODY: HCV Ab: NEGATIVE

## 2017-02-22 NOTE — Progress Notes (Signed)
I have reviewed this visit and discussed with Howell Rucks, RN, BSN, and agree with her documentation.   Elberta Leatherwood, MD,MS,  PGY3 02/22/2017 5:29 PM

## 2017-02-24 ENCOUNTER — Encounter: Payer: Self-pay | Admitting: Vascular Surgery

## 2017-03-08 ENCOUNTER — Encounter: Payer: Self-pay | Admitting: Vascular Surgery

## 2017-03-08 ENCOUNTER — Ambulatory Visit (INDEPENDENT_AMBULATORY_CARE_PROVIDER_SITE_OTHER)
Admission: RE | Admit: 2017-03-08 | Discharge: 2017-03-08 | Disposition: A | Discharge status: Home / Self Care | Payer: Medicare Other | Source: Ambulatory Visit | Attending: Vascular Surgery | Admitting: Vascular Surgery

## 2017-03-08 ENCOUNTER — Ambulatory Visit (INDEPENDENT_AMBULATORY_CARE_PROVIDER_SITE_OTHER): Payer: Medicare Other | Admitting: Vascular Surgery

## 2017-03-08 ENCOUNTER — Ambulatory Visit (HOSPITAL_COMMUNITY)
Admission: RE | Admit: 2017-03-08 | Discharge: 2017-03-08 | Disposition: A | Discharge status: Home / Self Care | Payer: Medicare Other | Source: Ambulatory Visit | Attending: Vascular Surgery | Admitting: Vascular Surgery

## 2017-03-08 VITALS — BP 162/88 | HR 71 | Temp 97.7°F | Resp 20 | Ht 67.0 in | Wt 203.6 lb

## 2017-03-08 DIAGNOSIS — I7789 Other specified disorders of arteries and arterioles: Secondary | ICD-10-CM | POA: Insufficient documentation

## 2017-03-08 DIAGNOSIS — I739 Peripheral vascular disease, unspecified: Secondary | ICD-10-CM | POA: Diagnosis not present

## 2017-03-08 NOTE — Progress Notes (Signed)
Patient name: [name] MRN: [id number] DOB: 03-06-1951 Sex: male  REASON FOR VISIT: Follow up  HPI: [name] is a 66 y.o. male who I last saw on 12/07/2016. He undergone a previous left femoral to below-knee popliteal artery bypass with a vein graft by Dr. Kellie Simmering. This occluded and he presented with a nonhealing wound on the dorsum of his left foot. His only remaining option was a femoral to peroneal artery bypass with a prosthetic graft. He underwent a left femoral to peroneal artery bypass with a 6 mm PTFE on 11/11/2016. All of the vein was sclerotic and there was no vein available to use for a composite bypass.  He comes in for a 3 month follow up visit. He has no specific complaints. He does not smoke cigarettes but does smoke cigars. He denies claudication, rest pain, or nonhealing ulcers. He states that the wound on the dorsum of his foot has healed.  Past Medical History:  Diagnosis Date  . Anemia   . Arthritis    OA  . Cervical radiculopathy    Dr. Vertell Limber neurosurgery  . Chronic lower back pain   . Diabetes mellitus    takes Metformin daily  . GERD (gastroesophageal reflux disease)    takes Protonix daily  . History of blood transfusion    "related to low HgB" ((09/10/2015  . Hyperlipidemia    takes Vytorin daily  . Hypertension    takes Benazepril and Bystolic daily  . PAD (peripheral artery disease) ([place])   . Pneumonia   . Shortness of breath dyspnea    with exertion  . Tobacco user   . Toe fracture, right 05/09/2011    Family History  Problem Relation Age of Onset  . Cancer Mother     colon Cancer  . Hyperlipidemia Mother   . Diabetes Mother   . Heart disease Father   . Hypertension Father   . Cancer Sister     Uterine  . Diabetes Sister   . Diabetes Brother     SOCIAL HISTORY: Social History  Substance Use Topics  . Smoking status: Current Some Day Smoker    Years: 24.00    Types: Cigars  . Smokeless tobacco: Never Used     Comment:  smokes 1-2 cigars per day   . Alcohol use 1.2 oz/week    2 Cans of beer per week    Allergies  Allergen Reactions  . Glipizide Other (See Comments)    REACTION IS SIDE EFFECT Severe hypoglycemia to 40s.     Current Outpatient Prescriptions  Medication Sig Dispense Refill  . acetaminophen (TYLENOL) 500 MG tablet Take 1,000 mg by mouth 3 (three) times daily as needed for moderate pain.    Marland Kitchen aspirin EC 81 MG tablet Take 162 mg by mouth daily.    . benazepril (LOTENSIN) 5 MG tablet Take 1 tablet (5 mg total) by mouth daily. 90 tablet 3  . cilostazol (PLETAL) 100 MG tablet Take 100 mg by mouth 2 (two) times daily.  5  . clopidogrel (PLAVIX) 75 MG tablet TAKE 1 TABLET EVERY DAY 90 tablet 3  . docusate sodium (COLACE) 100 MG capsule Take 1 capsule (100 mg total) by mouth 2 (two) times daily. 60 capsule 2  . ezetimibe (ZETIA) 10 MG tablet Take 1 tablet (10 mg total) by mouth daily. 90 tablet 3  . FLUoxetine (PROZAC) 10 MG capsule Take 1 capsule (10 mg total) by mouth daily. 90 capsule 3  .  gabapentin (NEURONTIN) 800 MG tablet TAKE 1.5 TABLETS (1,200 MG TOTAL) BY MOUTH 3 (THREE) TIMES DAILY. 135 tablet 11  . meloxicam (MOBIC) 15 MG tablet Take 1 tablet (15 mg total) by mouth daily. 14 tablet 0  . metFORMIN (GLUCOPHAGE) 500 MG tablet Take 500 mg by mouth daily.    . Nebivolol HCl (BYSTOLIC) 20 MG TABS Take 1 tablet (20 mg total) by mouth daily. 30 tablet 11  . oxyCODONE-acetaminophen (PERCOCET/ROXICET) 5-325 MG tablet Take 1 tablet by mouth every 6 (six) hours as needed. 30 tablet 0  . pantoprazole (PROTONIX) 40 MG tablet Take 1 tablet (40 mg total) by mouth daily. 30 tablet 11  . cyclobenzaprine (FLEXERIL) 10 MG tablet TAKE ONE (1) TABLET BY MOUTH THREE TIMES DAILY AS NEEDED FOR MUSCLE SPASMS (Patient not taking: Reported on 02/21/2017) 45 tablet 3   Current Facility-Administered Medications  Medication Dose Route Frequency Provider Last Rate Last Dose  . atorvastatin (LIPITOR) tablet 10 mg  10  mg Oral q1800 Alvia Grove, PA-C        REVIEW OF SYSTEMS:  [X]  denotes positive finding, [ ]  denotes negative finding Cardiac  Comments:  Chest pain or chest pressure:    Shortness of breath upon exertion:    Short of breath when lying flat:    Irregular heart rhythm:        Vascular    Pain in calf, thigh, or hip brought on by ambulation: X   Pain in feet at night that wakes you up from your sleep:     Blood clot in your veins:    Leg swelling:  X       Pulmonary    Oxygen at home:    Productive cough:     Wheezing:         Neurologic    Sudden weakness in arms or legs:     Sudden numbness in arms or legs:     Sudden onset of difficulty speaking or slurred speech:    Temporary loss of vision in one eye:     Problems with dizziness:         Gastrointestinal    Blood in stool:     Vomited blood:         Genitourinary    Burning when urinating:     Blood in urine:        Psychiatric    Major depression:         Hematologic    Bleeding problems:    Problems with blood clotting too easily:        Skin    Rashes or ulcers:        Constitutional    Fever or chills:      PHYSICAL EXAM: Vitals:   03/08/17 1057 03/08/17 1058  BP: (!) 159/89 (!) 162/88  Pulse: 71   Resp: 20   Temp: 97.7 F (36.5 C)   TempSrc: Oral   SpO2: 99%   Weight: 203 lb 9.6 oz (92.4 kg)   Height: 5\' 7"  (1.702 m)     GENERAL: The patient is a well-nourished male, in no acute distress. The vital signs are documented above. CARDIAC: There is a regular rate and rhythm.  VASCULAR: I did not detect carotid bruits. He has palpable femoral pulses. Both feet are warm and well-perfused. He has mild left lower extremity swelling. PULMONARY: There is good air exchange bilaterally without wheezing or rales. ABDOMEN: Soft and non-tender with normal pitched bowel sounds.  MUSCULOSKELETAL: There are no major deformities or cyanosis. NEUROLOGIC: No focal weakness or paresthesias are  detected. SKIN: There are no ulcers or rashes noted. PSYCHIATRIC: The patient has a normal affect.  DATA:   ARTERIAL DOPPLER: I have been interpreted his arterial Doppler study.  On the left side which is the side of his bypass, he has a triphasic posterior tibial signal with a biphasic dorsalis pedis signal with an ABI of 94%. Toe pressure on the left is 127 mmHg.  On the right side, there is a monophasic dorsalis pedis and posterior tibial signal with an ABI of 55%. Toe pressures 84 mmHg.  DUPLEX LEFT THEM PERONEAL BYPASS: I have independently interpreted the duplex of his bypass graft which shows that the graft is patent with no areas of stenosis noted.  MEDICAL ISSUES:  STATUS POST LEFT FEMORAL TO PERONEAL ARTERY BYPASS WITH PTFE: This was our last chance for revascularization on the left were had nonhealing wounds. He had no available vein for a composite graft and the peroneal artery was noted to be very small. Fortunately the graft has remained open and the foot wound has now healed. I have encouraged him to stay as active as possible. We will follow his graft closely. If this graft occludes in the future it might be worth considering one thrombectomy and adding oral anticoagulation. On the other hand, if he is asymptomatic certainly an option would be not to chase this given that he has no vein available and that the peroneal artery is quite small. He'll call sooner if he has any problems.    Deitra Mayo [facility] [phone]

## 2017-03-08 NOTE — Addendum Note (Signed)
Addended by: Lianne Cure A on: 03/08/2017 01:56 PM   Modules accepted: Orders

## 2017-03-14 ENCOUNTER — Ambulatory Visit (INDEPENDENT_AMBULATORY_CARE_PROVIDER_SITE_OTHER): Payer: Medicare Other | Admitting: Family Medicine

## 2017-03-14 ENCOUNTER — Encounter: Payer: Self-pay | Admitting: Family Medicine

## 2017-03-14 VITALS — BP 118/60 | HR 80 | Temp 97.2°F | Ht 67.0 in | Wt 211.8 lb

## 2017-03-14 DIAGNOSIS — E119 Type 2 diabetes mellitus without complications: Secondary | ICD-10-CM

## 2017-03-14 DIAGNOSIS — E7800 Pure hypercholesterolemia, unspecified: Secondary | ICD-10-CM | POA: Diagnosis not present

## 2017-03-14 DIAGNOSIS — I739 Peripheral vascular disease, unspecified: Secondary | ICD-10-CM | POA: Diagnosis not present

## 2017-03-14 LAB — POCT GLYCOSYLATED HEMOGLOBIN (HGB A1C): Hemoglobin A1C: 7.4

## 2017-03-14 MED ORDER — METFORMIN HCL 850 MG PO TABS: 850.0000 mg | ORAL_TABLET | Freq: Every day | ORAL | 1 refills | Status: DC

## 2017-03-14 MED ORDER — ATORVASTATIN CALCIUM 40 MG PO TABS: 40.0000 mg | ORAL_TABLET | Freq: Every day | ORAL | 3 refills | Status: DC

## 2017-03-14 NOTE — Patient Instructions (Signed)
It was a pleasure seeing you today in our clinic. Today we discussed your diabetes. Here is the treatment plan we have discussed and agreed upon together:  - I have restarted you on Atorvastatin. Take this 1 time a day. - I have increased your Metformin to 850mg  (from 500mg ). Take one tablet 1 time a day. - Follow up in 3 months.

## 2017-03-14 NOTE — Assessment & Plan Note (Signed)
Stable/improved: Patient has been very pleased with the results of his recent vascular surgery. He is able to ambulate with much more freedom and less pain since this procedure. - Continue/restart Zetia/statin. - Continue Pletal - Continue Plavix - Encourage ambulation - Encourage smoking cessation.

## 2017-03-14 NOTE — Assessment & Plan Note (Signed)
Patient states that he has not been taking his atorvastatin. He endorses good compliance with his Zetia. His reasoning for noncompliance with atorvastatin was the fact that he did not realize he should be taking this. - Restarted atorvastatin 40 mg daily. - Continue Zetia.

## 2017-03-14 NOTE — Progress Notes (Signed)
   HPI  CC: Diabetes Patient is here for a diabetes check. He states that he has been doing well. He endorses good compliance with all medications at this time. He states that he has not had any issues with hypoglycemic events. He overall feels very well. Denies recent fever, chills, lightheadedness, dizziness, diaphoresis, weakness, numbness, paresthesias, nausea, vomiting, or diarrhea.  Hyperlipidemia: While going through patient's medications patient was not sure whether or not he was currently on atorvastatin. I informed him of the importance of this medication and he agreed to restart taking it. Patient endorsed good compliance with his Zetia.  Peripheral vascular disease: Patient endorses significantly improve symptoms ever since his most recent vascular surgery. No significant issues at this time. He feels as though he is healing well. Continues to have some swelling in his lower extremities. No setbacks at this time. He is able to ambulate well without significant signs of claudication.  Review of Systems See HPI for ROS.   CC, SH/smoking status (1 cigar every other day), and VS noted  Objective: BP 118/60   Pulse 80   Temp 97.2 F (36.2 C) (Oral)   Ht 5\' 7"  (1.702 m)   Wt 211 lb 12.8 oz (96.1 kg)   SpO2 98%   BMI 33.17 kg/m  Gen: NAD, alert, cooperative, and pleasant. HEENT: NCAT, EOMI, PERRL CV: RRR, no murmur Resp: CTAB, no wheezes, non-labored Ext: +1 edema bilaterally, warm Neuro: Alert and oriented, Speech clear, No gross deficits   Assessment and plan:  Diabetes mellitus type II, controlled Stable: A1c 7.4 today. Patient endorses good compliance and no adverse side effects from his medications. We discussed the need to increase his overall activity as well as increasing medical therapy to help better control his diabetes. Patient was very comfortable with these changes. - Increase metformin from 500 mg daily to 850 mg daily. - Encourage regular exercise as  tolerated. - Follow-up in 3 months.  HYPERCHOLESTEROLEMIA Patient states that he has not been taking his atorvastatin. He endorses good compliance with his Zetia. His reasoning for noncompliance with atorvastatin was the fact that he did not realize he should be taking this. - Restarted atorvastatin 40 mg daily. - Continue Zetia.  PAD (peripheral artery disease) ([facility]) Stable/improved: Patient has been very pleased with the results of his recent vascular surgery. He is able to ambulate with much more freedom and less pain since this procedure. - Continue/restart Zetia/statin. - Continue Pletal - Continue Plavix - Encourage ambulation - Encourage smoking cessation.   Orders Placed This Encounter  Procedures  . HgB A1c    Meds ordered this encounter  Medications  . atorvastatin (LIPITOR) 40 MG tablet    Sig: Take 1 tablet (40 mg total) by mouth daily.    Dispense:  90 tablet    Refill:  3  . metFORMIN (GLUCOPHAGE) 850 MG tablet    Sig: Take 1 tablet (850 mg total) by mouth daily with breakfast.    Dispense:  90 tablet    Refill:  1     Elberta Leatherwood, MD,MS,  PGY3 03/14/2017 6:13 PM

## 2017-03-14 NOTE — Assessment & Plan Note (Signed)
Stable: A1c 7.4 today. Patient endorses good compliance and no adverse side effects from his medications. We discussed the need to increase his overall activity as well as increasing medical therapy to help better control his diabetes. Patient was very comfortable with these changes. - Increase metformin from 500 mg daily to 850 mg daily. - Encourage regular exercise as tolerated. - Follow-up in 3 months.

## 2017-03-27 ENCOUNTER — Other Ambulatory Visit: Payer: Self-pay | Admitting: Family Medicine

## 2017-04-01 ENCOUNTER — Emergency Department (HOSPITAL_COMMUNITY)
Admission: EM | Admit: 2017-04-01 | Discharge: 2017-04-01 | Disposition: A | Discharge status: Home / Self Care | Payer: Medicare Other | Attending: Emergency Medicine | Admitting: Emergency Medicine

## 2017-04-01 ENCOUNTER — Emergency Department (HOSPITAL_COMMUNITY): Payer: Medicare Other

## 2017-04-01 ENCOUNTER — Encounter (HOSPITAL_COMMUNITY): Payer: Self-pay | Admitting: Radiology

## 2017-04-01 DIAGNOSIS — F17290 Nicotine dependence, other tobacco product, uncomplicated: Secondary | ICD-10-CM | POA: Diagnosis not present

## 2017-04-01 DIAGNOSIS — I10 Essential (primary) hypertension: Secondary | ICD-10-CM | POA: Insufficient documentation

## 2017-04-01 DIAGNOSIS — Z7902 Long term (current) use of antithrombotics/antiplatelets: Secondary | ICD-10-CM | POA: Insufficient documentation

## 2017-04-01 DIAGNOSIS — R0789 Other chest pain: Secondary | ICD-10-CM

## 2017-04-01 DIAGNOSIS — R0602 Shortness of breath: Secondary | ICD-10-CM | POA: Diagnosis not present

## 2017-04-01 DIAGNOSIS — R059 Cough, unspecified: Secondary | ICD-10-CM

## 2017-04-01 DIAGNOSIS — Z7982 Long term (current) use of aspirin: Secondary | ICD-10-CM | POA: Diagnosis not present

## 2017-04-01 DIAGNOSIS — R069 Unspecified abnormalities of breathing: Secondary | ICD-10-CM | POA: Diagnosis not present

## 2017-04-01 DIAGNOSIS — R05 Cough: Secondary | ICD-10-CM | POA: Diagnosis not present

## 2017-04-01 DIAGNOSIS — Z7984 Long term (current) use of oral hypoglycemic drugs: Secondary | ICD-10-CM | POA: Insufficient documentation

## 2017-04-01 DIAGNOSIS — E1140 Type 2 diabetes mellitus with diabetic neuropathy, unspecified: Secondary | ICD-10-CM | POA: Insufficient documentation

## 2017-04-01 DIAGNOSIS — R079 Chest pain, unspecified: Secondary | ICD-10-CM | POA: Diagnosis not present

## 2017-04-01 DIAGNOSIS — J209 Acute bronchitis, unspecified: Secondary | ICD-10-CM

## 2017-04-01 DIAGNOSIS — Z79899 Other long term (current) drug therapy: Secondary | ICD-10-CM | POA: Diagnosis not present

## 2017-04-01 DIAGNOSIS — F17210 Nicotine dependence, cigarettes, uncomplicated: Secondary | ICD-10-CM | POA: Diagnosis not present

## 2017-04-01 LAB — CBC WITH DIFFERENTIAL/PLATELET
Basophils Absolute: 0 10*3/uL (ref 0.0–0.1)
Basophils Relative: 0 %
Eosinophils Absolute: 0 10*3/uL (ref 0.0–0.7)
Eosinophils Relative: 0 %
HCT: 31.5 % — ABNORMAL LOW (ref 39.0–52.0)
Hemoglobin: 9.5 g/dL — ABNORMAL LOW (ref 13.0–17.0)
Lymphocytes Relative: 11 %
Lymphs Abs: 1.3 10*3/uL (ref 0.7–4.0)
MCH: 19.2 pg — ABNORMAL LOW (ref 26.0–34.0)
MCHC: 30.2 g/dL (ref 30.0–36.0)
MCV: 63.8 fL — ABNORMAL LOW (ref 78.0–100.0)
Monocytes Absolute: 1 10*3/uL (ref 0.1–1.0)
Monocytes Relative: 8 %
Neutro Abs: 9.8 10*3/uL — ABNORMAL HIGH (ref 1.7–7.7)
Neutrophils Relative %: 81 %
Platelets: 264 10*3/uL (ref 150–400)
RBC: 4.94 MIL/uL (ref 4.22–5.81)
RDW: 18.8 % — ABNORMAL HIGH (ref 11.5–15.5)
WBC: 12.1 10*3/uL — ABNORMAL HIGH (ref 4.0–10.5)

## 2017-04-01 LAB — BASIC METABOLIC PANEL
Anion gap: 8 (ref 5–15)
BUN: 8 mg/dL (ref 6–20)
CO2: 26 mmol/L (ref 22–32)
Calcium: 8.8 mg/dL — ABNORMAL LOW (ref 8.9–10.3)
Chloride: 99 mmol/L — ABNORMAL LOW (ref 101–111)
Creatinine, Ser: 0.99 mg/dL (ref 0.61–1.24)
GFR calc Af Amer: >60 mL/min (ref >60)
GFR calc non Af Amer: >60 mL/min (ref >60)
Glucose, Bld: 143 mg/dL — ABNORMAL HIGH (ref 65–99)
Potassium: 4 mmol/L (ref 3.5–5.1)
Sodium: 133 mmol/L — ABNORMAL LOW (ref 135–145)

## 2017-04-01 LAB — BRAIN NATRIURETIC PEPTIDE: B Natriuretic Peptide: 116.9 pg/mL — ABNORMAL HIGH (ref 0.0–100.0)

## 2017-04-01 LAB — I-STAT TROPONIN, ED: Troponin i, poc: 0 ng/mL (ref 0.00–0.08)

## 2017-04-01 LAB — PROTIME-INR
INR: 1.13
Prothrombin Time: 14.6 seconds (ref 11.4–15.2)

## 2017-04-01 LAB — APTT: aPTT: 30 seconds (ref 24–36)

## 2017-04-01 MED ORDER — AEROCHAMBER PLUS FLO-VU MEDIUM MISC
1.0000 | Freq: Once | Status: AC
  Administered 2017-04-01: 1
  Filled 2017-04-01: qty 1

## 2017-04-01 MED ORDER — HYDROCODONE-ACETAMINOPHEN 5-325 MG PO TABS
1.0000 | ORAL_TABLET | Freq: Once | ORAL | Status: AC
  Administered 2017-04-01: 1 via ORAL
  Filled 2017-04-01: qty 1

## 2017-04-01 MED ORDER — PREDNISONE 10 MG PO TABS: 50.0000 mg | ORAL_TABLET | Freq: Every day | ORAL | 0 refills | Status: AC

## 2017-04-01 MED ORDER — METHYLPREDNISOLONE SODIUM SUCC 125 MG IJ SOLR
125.0000 mg | Freq: Once | INTRAMUSCULAR | Status: AC
  Administered 2017-04-01: 125 mg via INTRAVENOUS
  Filled 2017-04-01: qty 2

## 2017-04-01 MED ORDER — IPRATROPIUM-ALBUTEROL 0.5-2.5 (3) MG/3ML IN SOLN
3.0000 mL | Freq: Once | RESPIRATORY_TRACT | Status: AC
  Administered 2017-04-01: 3 mL via RESPIRATORY_TRACT
  Filled 2017-04-01: qty 3

## 2017-04-01 MED ORDER — DM-GUAIFENESIN ER 30-600 MG PO TB12: 1.0000 | ORAL_TABLET | Freq: Two times a day (BID) | ORAL | 0 refills | Status: DC | PRN

## 2017-04-01 MED ORDER — IOPAMIDOL (ISOVUE-370) INJECTION 76%
INTRAVENOUS | Status: AC
  Administered 2017-04-01: 100 mL via INTRAVENOUS
  Filled 2017-04-01: qty 100

## 2017-04-01 MED ORDER — ALBUTEROL SULFATE HFA 108 (90 BASE) MCG/ACT IN AERS
2.0000 | INHALATION_SPRAY | Freq: Once | RESPIRATORY_TRACT | Status: AC
  Administered 2017-04-01: 2 via RESPIRATORY_TRACT
  Filled 2017-04-01: qty 6.7

## 2017-04-01 NOTE — ED Triage Notes (Signed)
He states he has had non-productive cough since yesterday evening. He states he has "coughed so hard I'm hurting (points at right lower rib/upper abd. Area). He arrives in no distress, having received an Albuterol neb. En route to hospital.

## 2017-04-01 NOTE — ED Notes (Signed)
Bed: YW90 Expected date:  Expected time:  Means of arrival:  Comments: 66 yo cough, congestion

## 2017-04-01 NOTE — ED Notes (Signed)
Pt ambulated to restroom. O2 sats were being monitored=93%.

## 2017-04-01 NOTE — ED Notes (Signed)
Pulse oxymetry while ambulating 93-95%. He does become somewhat short of breath while ambulating, but does not have dyspnea.

## 2017-04-01 NOTE — ED Notes (Signed)
Patient transported to CT 

## 2017-04-01 NOTE — ED Provider Notes (Signed)
[facility] DEPT Provider Note   CSN: [id number] Arrival date & time: 04/01/17  1006     History   Chief Complaint Chief Complaint  Patient presents with  . Cough  . Chest Pain    HPI [name] is a 66 y.o. male.  The history is provided by the patient.  Cough  This is a new problem. The current episode started 12 to 24 hours ago. The problem occurs constantly. The problem has not changed since onset.The cough is non-productive. There has been no fever. Associated symptoms include chest pain (right lateral). He has tried nothing for the symptoms. He is not a smoker. His past medical history does not include pneumonia.    Past Medical History:  Diagnosis Date  . Anemia   . Arthritis    OA  . Cervical radiculopathy    Dr. Vertell Limber neurosurgery  . Chronic lower back pain   . Diabetes mellitus    takes Metformin daily  . GERD (gastroesophageal reflux disease)    takes Protonix daily  . History of blood transfusion    "related to low HgB" ((09/10/2015  . Hyperlipidemia    takes Vytorin daily  . Hypertension    takes Benazepril and Bystolic daily  . PAD (peripheral artery disease) ([place])   . Pneumonia   . Shortness of breath dyspnea    with exertion  . Tobacco user   . Toe fracture, right 05/09/2011    Patient Active Problem List   Diagnosis Date Noted  . Radiculopathy, cervical region 02/11/2017  . Depression 10/03/2016  . Lumbar back pain 06/24/2016  . Vision changes 02/02/2016  . Claudication of both lower extremities ([place]) 07/15/2015  . Proteinuria 08/27/2014  . Diabetic neuropathy ([place]) 02/27/2014  . Iron deficiency anemia 08/01/2012  . Diabetes mellitus type II, controlled ([place]) 06/18/2009  . GERD 01/14/2009  . HYPERCHOLESTEROLEMIA 02/22/2007  . OBESITY, NOS 02/22/2007  . TOBACCO DEPENDENCE 02/22/2007  . HYPERTENSION, BENIGN SYSTEMIC 02/22/2007  . PAD (peripheral artery disease) ([place]) 02/22/2007  . OSTEOARTHRITIS, MULTI SITES 02/22/2007    Past  Surgical History:  Procedure Laterality Date  . ANTERIOR CERVICAL DECOMP/DISCECTOMY FUSION  03/08/12   C6-7  . ANTERIOR CERVICAL DECOMP/DISCECTOMY FUSION  03/08/2012   Procedure: ANTERIOR CERVICAL DECOMPRESSION/DISCECTOMY FUSION 1 LEVEL/HARDWARE REMOVAL;  Surgeon: Erline Levine, MD;  Location: [facility];  Service: Neurosurgery;  Laterality: N/A;  revison of C5-7 anterior cervical decompression with fusion with Cervical Five-Thoracic One anterior cervical decompression with fusion with interbody prothesis plating and bonegraft  . BACK SURGERY  1996  . BYPASS GRAFT FEMORAL-PERONEAL Left 11/11/2016   Procedure: REDO LEFT FEMORAL-PERONEAL BYPASS WITH PROPATEN 6MM X 80CM GRAFT;  Surgeon: Angelia Mould, MD;  Location: [facility];  Service: Vascular;  Laterality: Left;  . COLONOSCOPY W/ BIOPSIES AND POLYPECTOMY  08/17/2012   f/u 5 years, 4 polyps, no high grade dysplasia or malignancy, tubular adenoma, hyperplastic polyops  . ENTEROSCOPY N/A 12/11/2015   Procedure: ENTEROSCOPY;  Surgeon: Carol Ada, MD;  Location: [facility] ENDOSCOPY;  Service: Endoscopy;  Laterality: N/A;  . ESOPHAGOGASTRODUODENOSCOPY  08/17/2012   normal esophagus and GEJ, diffuse gastritis with erythema- no malignancy, reactive gastropathy  with focal intestinal metaplasia  . FEMORAL-POPLITEAL BYPASS GRAFT Left 01/06/2016   Procedure: Left  COMMON FEMORAL-BELOW KNEE POPLITEAL ARTERY Bypass using non-reversed translocated saphenous vein graft from left leg;  Surgeon: Mal Misty, MD;  Location: [place];  Service: Vascular;  Laterality: Left;  . GIVENS CAPSULE STUDY N/A 11/24/2015  Procedure: GIVENS CAPSULE STUDY;  Surgeon: Juanita Craver, MD;  Location: [facility]COPY;  Service: Endoscopy;  Laterality: N/A;  . INGUINAL HERNIA REPAIR  1990's   right  . INTRAOPERATIVE ARTERIOGRAM Left 01/06/2016   Procedure: INTRA OPERATIVE ARTERIOGRAM LEFT LOWER LEG;  Surgeon: Mal Misty, MD;  Location: [place];  Service: Vascular;  Laterality: Left;  .  INTRAOPERATIVE ARTERIOGRAM Left 11/11/2016   Procedure: INTRA OPERATIVE ARTERIOGRAM LEFT LOWER EXTRIMITY;  Surgeon: Angelia Mould, MD;  Location: [place];  Service: Vascular;  Laterality: Left;  . IR GENERIC HISTORICAL  10/24/2016   IR ANGIOGRAM FOLLOW UP STUDY  . LOWER EXTREMITY ANGIOGRAM Bilateral 07/30/2015   Procedure: Lower Extremity Angiogram;  Surgeon: Conrad [place], MD;  Location: [place] CV LAB;  Service: Cardiovascular;  Laterality: Bilateral;  . [place]   "lower"  . PERIPHERAL VASCULAR CATHETERIZATION N/A 07/30/2015   Procedure: Abdominal Aortogram;  Surgeon: Conrad Colver, MD;  Location: [place] CV LAB;  Service: Cardiovascular;  Laterality: N/A;  . PERIPHERAL VASCULAR CATHETERIZATION N/A 10/24/2016   Procedure: Abdominal Aortogram;  Surgeon: Angelia Mould, MD;  Location: [place] CV LAB;  Service: Cardiovascular;  Laterality: N/A;  . PERIPHERAL VASCULAR CATHETERIZATION N/A 10/24/2016   Procedure: Lower Extremity Angiography;  Surgeon: Angelia Mould, MD;  Location: [place] CV LAB;  Service: Cardiovascular;  Laterality: N/A;  . VASCULAR SURGERY  ~ 2007   Stent SFA   . VEIN HARVEST Left 01/06/2016   Procedure: LEFT GREATER SAPPHENOUS VEIN HARVEST;  Surgeon: Mal Misty, MD;  Location: [place];  Service: Vascular;  Laterality: Left;       Home Medications    Prior to Admission medications   Medication Sig Start Date End Date Taking? Authorizing Provider  acetaminophen (TYLENOL) 500 MG tablet Take 1,000 mg by mouth 3 (three) times daily as needed for moderate pain.   Yes Historical Provider, MD  aspirin EC 81 MG tablet Take 162 mg by mouth daily.   Yes Historical Provider, MD  atorvastatin (LIPITOR) 40 MG tablet Take 1 tablet (40 mg total) by mouth daily. 03/14/17  Yes Elberta Leatherwood, MD  benazepril (LOTENSIN) 5 MG tablet Take 1 tablet (5 mg total) by mouth daily. 11/16/16  Yes Elberta Leatherwood, MD  clopidogrel (PLAVIX) 75 MG tablet  TAKE 1 TABLET EVERY DAY 12/08/15  Yes Elberta Leatherwood, MD  docusate sodium (COLACE) 100 MG capsule Take 1 capsule (100 mg total) by mouth 2 (two) times daily. 12/08/16  Yes Elberta Leatherwood, MD  ezetimibe (ZETIA) 10 MG tablet Take 1 tablet (10 mg total) by mouth daily. 01/28/16  Yes Elberta Leatherwood, MD  gabapentin (NEURONTIN) 800 MG tablet TAKE 1.5 TABLETS (1,200 MG TOTAL) BY MOUTH 3 (THREE) TIMES DAILY. 11/16/16  Yes Elberta Leatherwood, MD  metFORMIN (GLUCOPHAGE) 850 MG tablet Take 1 tablet (850 mg total) by mouth daily with breakfast. 03/14/17  Yes Elberta Leatherwood, MD  Nebivolol HCl (BYSTOLIC) 20 MG TABS Take 1 tablet (20 mg total) by mouth daily. 12/01/16  Yes Elberta Leatherwood, MD  pantoprazole (PROTONIX) 40 MG tablet Take 1 tablet (40 mg total) by mouth daily. 11/16/16  Yes Elberta Leatherwood, MD  Phenylephrine-DM-GG (MUCINEX FAST-MAX CONGEST COUGH) 2.5-5-100 MG/5ML LIQD Take 30 mLs by mouth every 12 (twelve) hours as needed (cough).   Yes Historical Provider, MD    Family History Family History  Problem Relation Age of Onset  . Cancer Mother  colon Cancer  . Hyperlipidemia Mother   . Diabetes Mother   . Heart disease Father   . Hypertension Father   . Cancer Sister     Uterine  . Diabetes Sister   . Diabetes Brother     Social History Social History  Substance Use Topics  . Smoking status: Current Some Day Smoker    Years: 24.00    Types: Cigars  . Smokeless tobacco: Never Used     Comment: smokes 1-2 cigars per day   . Alcohol use 1.2 oz/week    2 Cans of beer per week     Allergies   Glipizide   Review of Systems Review of Systems  Respiratory: Positive for cough.   Cardiovascular: Positive for chest pain (right lateral).  All other systems reviewed and are negative.    Physical Exam Updated Vital Signs BP (!) 141/72 (BP Location: Right Arm)   Pulse 91   Temp 98.7 F (37.1 C) (Oral)   Resp (!) 26   SpO2 92%   Physical Exam  Constitutional: He is oriented to person, place, and  time. He appears well-developed and well-nourished. No distress.  HENT:  Head: Normocephalic and atraumatic.  Nose: Nose normal.  Eyes: Conjunctivae are normal.  Neck: Neck supple. No tracheal deviation present.  Cardiovascular: Regular rhythm.  Tachycardia present.   Pulmonary/Chest: Effort normal. Tachypnea noted. No respiratory distress. He has wheezes (diffuse expiratory). He exhibits tenderness (right, lower lateral).  Abdominal: Soft. He exhibits no distension. There is no tenderness.  Neurological: He is alert and oriented to person, place, and time.  Skin: Skin is warm and dry.  Psychiatric: He has a normal mood and affect.  Vitals reviewed.    ED Treatments / Results  Labs (all labs ordered are listed, but only abnormal results are displayed) Labs Reviewed  CBC WITH DIFFERENTIAL/PLATELET - Abnormal; Notable for the following:       Result Value   WBC 12.1 (*)    Hemoglobin 9.5 (*)    HCT 31.5 (*)    MCV 63.8 (*)    MCH 19.2 (*)    RDW 18.8 (*)    Neutro Abs 9.8 (*)    All other components within normal limits  BASIC METABOLIC PANEL - Abnormal; Notable for the following:    Sodium 133 (*)    Chloride 99 (*)    Glucose, Bld 143 (*)    Calcium 8.8 (*)    All other components within normal limits  BRAIN NATRIURETIC PEPTIDE - Abnormal; Notable for the following:    B Natriuretic Peptide 116.9 (*)    All other components within normal limits  PROTIME-INR  APTT  I-STAT TROPOININ, ED    EKG  EKG Interpretation  Date/Time:  Saturday April 01 2017 10:55:07 EDT Ventricular Rate:  98 PR Interval:    QRS Duration: 117 QT Interval:  372 QTC Calculation: 475 R Axis:   12 Text Interpretation:  Sinus rhythm Left ventricular hypertrophy No significant change since last tracing Confirmed by Jerrica Thorman MD, Quillian Quince ([id number]) on 04/01/2017 11:39:35 AM       Radiology Ct Angio Chest Pe W And/or Wo Contrast  Result Date: 04/01/2017 CLINICAL DATA:  66 year old male with a history  of nonproductive cough. Chest pain EXAM: CT ANGIOGRAPHY CHEST WITH CONTRAST TECHNIQUE: Multidetector CT imaging of the chest was performed using the standard protocol during bolus administration of intravenous contrast. Multiplanar CT image reconstructions and MIPs were obtained to evaluate the vascular anatomy. CONTRAST:  100 cc Isovue 370 COMPARISON:  None. FINDINGS: Cardiovascular: Heart: No cardiomegaly. No pericardial fluid/thickening. Calcifications of left main, left anterior descending, circumflex, right coronary arteries. Aorta: Calcifications of the aortic arch into the branch vessels. No occlusion of the branch vessels. No aneurysm or dissection. Calcifications of descending thoracic aorta with mild soft plaque. Pulmonary arteries: Bolus timing not optimized for evaluation of pulmonary arteries. No central lobar, segmental filling defects. Beyond this, evaluation of the pulmonary tree is limited. Mediastinum/Nodes: Mediastinal lymph nodes are present, none of which are enlarged by CT size criteria. Unremarkable appearance of the thoracic esophagus. Unremarkable appearance of the thoracic inlet and thyroid. Lungs/Pleura: Central airways are clear. No pleural effusion. No confluent left-sided airspace disease. No pneumothorax. 4 mm nodule of the left upper lobe (image 30 of series 11). Upper Abdomen: Unremarkable. Musculoskeletal: Surgical changes of the cervical region incompletely imaged. Degenerative changes of the thoracic spine. No bony canal narrowing. No displaced fracture identified. Review of the MIP images confirms the above findings. IMPRESSION: No acute finding to account for the patient's chest pain symptoms. Left main and 3 vessel coronary artery disease. Aortic atherosclerosis. 4 mm nodule of the left upper lobe. With a nodule of this size, the updated Fleischner Society guidelines recommend no specific follow-up in a patient without risk factors for carcinoma development, or if the patient  has risk factors, an optional 12 month noncontrast CT chest follow-up. Electronically Signed   By: Corrie Mckusick D.O.   On: 04/01/2017 12:05   Dg Chest Port 1 View  Result Date: 04/01/2017 CLINICAL DATA:  Sudden onset sob/cp x this am, pt reports he also had onset of cough x 1 day ago s/p nap EXAM: PORTABLE CHEST 1 VIEW COMPARISON:  Chest x-ray dated 11/18/2016. FINDINGS: The heart size and mediastinal contours are within normal limits. Atherosclerotic changes noted at the aortic arch. Again noted is mild bilateral interstitial prominence, likely accentuated by slightly low lung volumes. No confluent opacity to suggest a developing pneumonia. No pleural effusion or pneumothorax seen. No acute or suspicious osseous finding. IMPRESSION: 1. Stable mild interstitial prominence, suspect normal interstitium accentuated by slightly low lung volumes, less likely mild interstitial edema. 2. No evidence of consolidating pneumonia. 3. Aortic atherosclerosis. Electronically Signed   By: Franki Cabot M.D.   On: 04/01/2017 10:50    Procedures Procedures (including critical care time)  Medications Ordered in ED Medications  ipratropium-albuterol (DUONEB) 0.5-2.5 (3) MG/3ML nebulizer solution 3 mL (3 mLs Nebulization Given 04/01/17 1057)  methylPREDNISolone sodium succinate (SOLU-MEDROL) 125 mg/2 mL injection 125 mg (125 mg Intravenous Given 04/01/17 1117)  HYDROcodone-acetaminophen (NORCO/VICODIN) 5-325 MG per tablet 1 tablet (1 tablet Oral Given 04/01/17 1116)  iopamidol (ISOVUE-370) 76 % injection (100 mLs Intravenous Contrast Given 04/01/17 1147)  albuterol (PROVENTIL HFA;VENTOLIN HFA) 108 (90 Base) MCG/ACT inhaler 2 puff (2 puffs Inhalation Given 04/01/17 1238)  AEROCHAMBER PLUS FLO-VU MEDIUM MISC 1 each (1 each Other Given 04/01/17 1250)     Initial Impression / Assessment and Plan / ED Course  I have reviewed the triage vital signs and the nursing notes.  Pertinent labs & imaging results that were available  during my care of the patient were reviewed by me and considered in my medical decision making (see chart for details).     66 y.o. male presents with Right-sided chest pain starting with a coughing episode overnight and worsening. He is tender to palpation over his right lateral ribs. Differential considerations include spontaneous pneumothorax, pulmonary embolism, infectious disease  and reactive airway.  Plain films without pneumothorax, patient provided a breathing treatment to help with symptoms and started on steroids, evaluation for PE initiated with borderline hypoxemia and tachypnea with sudden onset lateral pleuritic chest pain and stated history of thrombosis.  CT is without evidence of rib fracture, pneumothorax, pulmonary embolism or other complicating feature. Provided medications for short-term pain control and steroid burst for presumed acute bronchitis with active wheezing.  Final Clinical Impressions(s) / ED Diagnoses   Final diagnoses:  Acute bronchitis, unspecified organism  Cough  Right-sided chest wall pain    New Prescriptions Discharge Medication List as of 04/01/2017 12:50 PM    START taking these medications   Details  dextromethorphan-guaiFENesin (MUCINEX DM) 30-600 MG 12hr tablet Take 1 tablet by mouth 2 (two) times daily as needed for cough., Starting Sat 04/01/2017, Print    predniSONE (DELTASONE) 10 MG tablet Take 5 tablets (50 mg total) by mouth daily with breakfast., Starting Sat 04/01/2017, Until Wed 04/05/2017, Print         Leo Grosser, MD 04/02/17 [id number]

## 2017-05-05 ENCOUNTER — Other Ambulatory Visit: Payer: Self-pay | Admitting: Family Medicine

## 2017-05-19 NOTE — Telephone Encounter (Signed)
Encounter opened and air

## 2017-06-05 ENCOUNTER — Encounter: Payer: Self-pay | Admitting: Family

## 2017-06-14 ENCOUNTER — Ambulatory Visit (HOSPITAL_COMMUNITY)
Admission: RE | Admit: 2017-06-14 | Discharge: 2017-06-14 | Disposition: A | Discharge status: Home / Self Care | Payer: Medicare Other | Source: Ambulatory Visit | Attending: Vascular Surgery | Admitting: Vascular Surgery

## 2017-06-14 ENCOUNTER — Ambulatory Visit (INDEPENDENT_AMBULATORY_CARE_PROVIDER_SITE_OTHER): Payer: Medicare Other | Admitting: Vascular Surgery

## 2017-06-14 VITALS — BP 133/70 | HR 71 | Temp 97.3°F | Resp 16 | Ht 67.0 in | Wt 208.0 lb

## 2017-06-14 DIAGNOSIS — I70221 Atherosclerosis of native arteries of extremities with rest pain, right leg: Secondary | ICD-10-CM | POA: Diagnosis not present

## 2017-06-14 DIAGNOSIS — I739 Peripheral vascular disease, unspecified: Secondary | ICD-10-CM

## 2017-06-14 NOTE — Progress Notes (Signed)
[facility] [place]  Patient name: [name] MRN: [id number] DOB: 15-Jul-1951 Sex: male  REASON FOR VISIT: follow-up  HPI: [name] is a 66 y.o. male who presents for continued follow-up of his left femoral to peroneal artery bypass with PTFE. This was performed on 11/11/2016 by Dr. Scot Dock for rest pain and nonhealing wound on the dorsum of his left foot. The patient did not have any usable vein to use for a composite bypass. His left foot wound has healed. He previously underwent left femoral to below-knee popliteal artery bypass with vein by Dr. Kellie Simmering which occluded.  He was last seen in the office on 03/08/2017. He had no complaints at that time. His bypass graft was open.  Today, he denies any issues with left leg. He does complain of his entire right leg hurting "all the time." This has been going on for 1 year but has worsened in the last couple months. He states the pain as throbbing and starts in his calf that radiates to his right buttock and hip. This pain occurs at rest and with walking. He also complains of pain in his right foot at night. He denies any non healing wounds.   He doesn't smoke cigarettes but does smoke 1-2 cigars. He does not plan on quitting.  His medical history includes diabetes mellitus type 2 on metformin. His last A1c was 7.4 on 03/14/2017. He is on an ACE inhibitor and beta blocker for hypertension. He is on a statin for hyper cholesterolemia. He takes a daily aspirin and Plavix.    Past Medical History:  Diagnosis Date  . Anemia   . Arthritis    OA  . Cervical radiculopathy    Dr. Vertell Limber neurosurgery  . Chronic lower back pain   . Diabetes mellitus    takes Metformin daily  . GERD (gastroesophageal reflux disease)    takes Protonix daily  . History of blood transfusion    "related to low HgB" ((09/10/2015  . Hyperlipidemia    takes Vytorin daily  . Hypertension    takes Benazepril and Bystolic daily  . PAD  (peripheral artery disease) ([place])   . Pneumonia   . Shortness of breath dyspnea    with exertion  . Tobacco user   . Toe fracture, right 05/09/2011    Family History  Problem Relation Age of Onset  . Cancer Mother        colon Cancer  . Hyperlipidemia Mother   . Diabetes Mother   . Heart disease Father   . Hypertension Father   . Cancer Sister        Uterine  . Diabetes Sister   . Diabetes Brother     SOCIAL HISTORY: Social History  Substance Use Topics  . Smoking status: Current Some Day Smoker    Years: 24.00    Types: Cigars  . Smokeless tobacco: Never Used     Comment: smokes 1-2 cigars per day   . Alcohol use 1.2 oz/week    2 Cans of beer per week    Allergies  Allergen Reactions  . Glipizide Other (See Comments)    REACTION IS SIDE EFFECT Severe hypoglycemia to 40s.     Current Outpatient Prescriptions  Medication Sig Dispense Refill  . acetaminophen (TYLENOL) 500 MG tablet Take 1,000 mg by mouth 3 (three) times daily as needed for moderate pain.    Marland Kitchen aspirin EC 81 MG tablet Take 162 mg by mouth daily.    Marland Kitchen  atorvastatin (LIPITOR) 40 MG tablet Take 1 tablet (40 mg total) by mouth daily. 90 tablet 3  . benazepril (LOTENSIN) 5 MG tablet Take 1 tablet (5 mg total) by mouth daily. 90 tablet 3  . clopidogrel (PLAVIX) 75 MG tablet TAKE 1 TABLET BY MOUTH ONCE DAILY 30 tablet 5  . cyclobenzaprine (FLEXERIL) 10 MG tablet TAKE ONE (1) TABLET BY MOUTH THREE TIMES DAILY AS NEEDED FOR MUSCLE SPASM(S) 45 tablet 3  . dextromethorphan-guaiFENesin (MUCINEX DM) 30-600 MG 12hr tablet Take 1 tablet by mouth 2 (two) times daily as needed for cough. 30 tablet 0  . docusate sodium (COLACE) 100 MG capsule Take 1 capsule (100 mg total) by mouth 2 (two) times daily. 60 capsule 2  . ezetimibe (ZETIA) 10 MG tablet Take 1 tablet (10 mg total) by mouth daily. 90 tablet 3  . FLUoxetine (PROZAC) 10 MG capsule     . gabapentin (NEURONTIN) 800 MG tablet TAKE 1.5 TABLETS (1,200 MG TOTAL) BY  MOUTH 3 (THREE) TIMES DAILY. 135 tablet 11  . metFORMIN (GLUCOPHAGE) 850 MG tablet Take 1 tablet (850 mg total) by mouth daily with breakfast. 90 tablet 1  . Nebivolol HCl (BYSTOLIC) 20 MG TABS Take 1 tablet (20 mg total) by mouth daily. 30 tablet 11  . pantoprazole (PROTONIX) 40 MG tablet TAKE 1 TABLET BY MOUTH ONCE DAILY 30 tablet 5  . Phenylephrine-DM-GG (MUCINEX FAST-MAX CONGEST COUGH) 2.5-5-100 MG/5ML LIQD Take 30 mLs by mouth every 12 (twelve) hours as needed (cough).     No current facility-administered medications for this visit.     REVIEW OF SYSTEMS:  [X]  denotes positive finding, [ ]  denotes negative finding Cardiac  Comments:  Chest pain or chest pressure:    Shortness of breath upon exertion:    Short of breath when lying flat:    Irregular heart rhythm:        Vascular    Pain in calf, thigh, or hip brought on by ambulation:    Pain in feet at night that wakes you up from your sleep:     Blood clot in your veins:    Leg swelling:         Pulmonary    Oxygen at home:    Productive cough:     Wheezing:         Neurologic    Sudden weakness in arms or legs:     Sudden numbness in arms or legs:     Sudden onset of difficulty speaking or slurred speech:    Temporary loss of vision in one eye:     Problems with dizziness:         Gastrointestinal    Blood in stool:     Vomited blood:         Genitourinary    Burning when urinating:     Blood in urine:        Psychiatric    Major depression:         Hematologic    Bleeding problems:    Problems with blood clotting too easily:        Skin    Rashes or ulcers:        Constitutional    Fever or chills:      PHYSICAL EXAM: Vitals:   06/14/17 1314 06/14/17 1320  BP: (!) 154/75 133/70  Pulse: 71 71  Resp: 16   Temp: 97.3 F (36.3 C)   SpO2: 99%   Weight: 208 lb (94.3 kg)  Height: 5\' 7"  (1.702 m)     GENERAL: The patient is a well-nourished male, in no acute distress. The vital signs are  documented above. HEENT: normocephalic, atraumatic. No abnormalities noted.  CARDIAC: There is a regular rate and rhythm. No carotid bruits. VASCULAR: Non palpable right femoral pulse. 1+ left femoral pulse. Non palpable popliteal and pedal pulses. Biphasic left DP and PT signals. Monophasic right peroneal signal.  PULMONARY: There is good air exchange bilaterally without wheezing or rales. ABDOMEN: Soft and non-tender.  MUSCULOSKELETAL: There are no major deformities or cyanosis. Toe nail came off left 4th toe.  NEUROLOGIC: No focal weakness or paresthesias are detected. SKIN: There are no ulcers or rashes noted. PSYCHIATRIC: The patient has a normal affect.  DATA:  Lower extremity arterial duplex and ABIs 06/14/2017  R: 0.5 (PT), 0.37 (DP), monophasic DP and PT L: 1.05 (PT), biphasic DP and PT; left femoral to peroneal bypass is patent.    MEDICAL ISSUES: RIGHT LEG REST PAIN:  Worsening pain right over last three months. Angiogram from 10/24/16 showed 40% focal stenosis in right external iliac, occlusion of right SFA and right popliteal. Right AT and PT occluded. Single vessel runoff via peroneal.  Had Dr. Donzetta Matters also evaluate patient who recommends follow-up with Dr. Scot Dock to discuss angiogram. Will schedule for next available appointment.   STATUS POST LEFT FEMORAL TO PERONEAL ARTERY BYPASS WITH PTFE:  Bypass is patent. No complaints. Is on ASA, Plavix and statin. Counseled extensively on smoking cessation. Will need continued follow-up with left bypass graft duplex and ABIs.   Virgina Jock, PA-C [facility] MD: Donzetta Matters

## 2017-06-26 ENCOUNTER — Encounter: Payer: Self-pay | Admitting: Vascular Surgery

## 2017-07-06 ENCOUNTER — Ambulatory Visit (INDEPENDENT_AMBULATORY_CARE_PROVIDER_SITE_OTHER): Payer: Medicare Other | Admitting: Vascular Surgery

## 2017-07-06 ENCOUNTER — Encounter: Payer: Self-pay | Admitting: Vascular Surgery

## 2017-07-06 VITALS — BP 128/66 | HR 69 | Temp 98.3°F | Resp 20 | Ht 67.0 in | Wt 208.4 lb

## 2017-07-06 DIAGNOSIS — I70221 Atherosclerosis of native arteries of extremities with rest pain, right leg: Secondary | ICD-10-CM | POA: Diagnosis not present

## 2017-07-06 DIAGNOSIS — I739 Peripheral vascular disease, unspecified: Secondary | ICD-10-CM

## 2017-07-06 NOTE — Progress Notes (Signed)
Patient name: [name] MRN: [id number] DOB: Sep 07, 1951 Sex: male  REASON FOR VISIT:    Follow up of peripheral vascular disease.  HPI:   [name] is a pleasant 66 y.o. male who underwent a left femoral to peroneal artery bypass with PTFE on 11/11/2016 for rest pain and a nonhealing wound on the dorsum of his left foot. He had previous undergone a left femoral to below-knee popliteal artery bypass by Dr. Kellie Simmering which was occluded. He was last seen in our office by the physician's assistant on 06/14/2017. At that time, he denied any issues with the left leg but was complaining of pain in the right lower extremity area this had been going on for 1 year but have gotten worse over the last few months.  Noninvasive study on 06/14/2017 showed an ABI the 100% on the left and 50% on the right. The patient had a previous arteriogram in October 2017 which showed on the right side it was a 40% stenosis in the right external iliac artery with occlusion of the right superficial femoral artery and right popliteal artery. The right anterior tibial and posterior tibial arteries were occluded. There was single-vessel runoff on the right via the peroneal artery. This arteriogram was 9 months ago.  Since he was seen last, he describes pain in his right lower extremity from the foot all the way up to the hip. This occurs with ambulation but also at rest. He does have some back pain. He denies any claudication in the left lower extremity. He denies any rest pain. He denies any history of nonhealing ulcers.  He does continue to smoke cigars but does not smoke cigarettes and does not inhale.  Past Medical History:  Diagnosis Date  . Anemia   . Arthritis    OA  . Cervical radiculopathy    Dr. Vertell Limber neurosurgery  . Chronic lower back pain   . Diabetes mellitus    takes Metformin daily  . GERD (gastroesophageal reflux disease)    takes Protonix daily  . History of blood transfusion    "related to low  HgB" ((09/10/2015  . Hyperlipidemia    takes Vytorin daily  . Hypertension    takes Benazepril and Bystolic daily  . PAD (peripheral artery disease) ([place])   . Pneumonia   . Shortness of breath dyspnea    with exertion  . Tobacco user   . Toe fracture, right 05/09/2011    Family History  Problem Relation Age of Onset  . Cancer Mother        colon Cancer  . Hyperlipidemia Mother   . Diabetes Mother   . Heart disease Father   . Hypertension Father   . Cancer Sister        Uterine  . Diabetes Sister   . Diabetes Brother     SOCIAL HISTORY: Social History  Substance Use Topics  . Smoking status: Current Some Day Smoker    Years: 24.00    Types: Cigars  . Smokeless tobacco: Never Used     Comment: smokes 1-2 cigars per day   . Alcohol use 1.2 oz/week    2 Cans of beer per week    Allergies  Allergen Reactions  . Glipizide Other (See Comments)    REACTION IS SIDE EFFECT Severe hypoglycemia to 40s.     Current Outpatient Prescriptions  Medication Sig Dispense Refill  . acetaminophen (TYLENOL) 500 MG tablet Take 1,000 mg by mouth 3 (three) times daily as  needed for moderate pain.    Marland Kitchen aspirin EC 81 MG tablet Take 162 mg by mouth daily.    Marland Kitchen atorvastatin (LIPITOR) 40 MG tablet Take 1 tablet (40 mg total) by mouth daily. 90 tablet 3  . benazepril (LOTENSIN) 5 MG tablet Take 1 tablet (5 mg total) by mouth daily. 90 tablet 3  . clopidogrel (PLAVIX) 75 MG tablet TAKE 1 TABLET BY MOUTH ONCE DAILY 30 tablet 5  . cyclobenzaprine (FLEXERIL) 10 MG tablet TAKE ONE (1) TABLET BY MOUTH THREE TIMES DAILY AS NEEDED FOR MUSCLE SPASM(S) 45 tablet 3  . dextromethorphan-guaiFENesin (MUCINEX DM) 30-600 MG 12hr tablet Take 1 tablet by mouth 2 (two) times daily as needed for cough. 30 tablet 0  . docusate sodium (COLACE) 100 MG capsule Take 1 capsule (100 mg total) by mouth 2 (two) times daily. 60 capsule 2  . ezetimibe (ZETIA) 10 MG tablet Take 1 tablet (10 mg total) by mouth daily. 90 tablet  3  . FLUoxetine (PROZAC) 10 MG capsule     . gabapentin (NEURONTIN) 800 MG tablet TAKE 1.5 TABLETS (1,200 MG TOTAL) BY MOUTH 3 (THREE) TIMES DAILY. 135 tablet 11  . metFORMIN (GLUCOPHAGE) 850 MG tablet Take 1 tablet (850 mg total) by mouth daily with breakfast. 90 tablet 1  . Nebivolol HCl (BYSTOLIC) 20 MG TABS Take 1 tablet (20 mg total) by mouth daily. 30 tablet 11  . pantoprazole (PROTONIX) 40 MG tablet TAKE 1 TABLET BY MOUTH ONCE DAILY 30 tablet 5  . Phenylephrine-DM-GG (MUCINEX FAST-MAX CONGEST COUGH) 2.5-5-100 MG/5ML LIQD Take 30 mLs by mouth every 12 (twelve) hours as needed (cough).     No current facility-administered medications for this visit.     REVIEW OF SYSTEMS:  [X]  denotes positive finding, [ ]  denotes negative finding Cardiac  Comments:  Chest pain or chest pressure:    Shortness of breath upon exertion:    Short of breath when lying flat:    Irregular heart rhythm:        Vascular    Pain in calf, thigh, or hip brought on by ambulation: X   Pain in feet at night that wakes you up from your sleep:     Blood clot in your veins:    Leg swelling:         Pulmonary    Oxygen at home:    Productive cough:     Wheezing:         Neurologic    Sudden weakness in arms or legs:     Sudden numbness in arms or legs:     Sudden onset of difficulty speaking or slurred speech:    Temporary loss of vision in one eye:     Problems with dizziness:         Gastrointestinal    Blood in stool:     Vomited blood:         Genitourinary    Burning when urinating:     Blood in urine:        Psychiatric    Major depression:         Hematologic    Bleeding problems:    Problems with blood clotting too easily:        Skin    Rashes or ulcers:        Constitutional    Fever or chills:     PHYSICAL EXAM:   Vitals:   07/06/17 1435  BP: 128/66  Pulse: 69  Resp:  20  Temp: 98.3 F (36.8 C)  TempSrc: Oral  SpO2: 100%  Weight: 208 lb 6.4 oz (94.5 kg)  Height: 5\' 7"   (1.702 m)    GENERAL: The patient is a well-nourished male, in no acute distress. The vital signs are documented above. CARDIAC: There is a regular rate and rhythm.  VASCULAR: I do not detect carotid bruits. On the right side he has a diminished femoral pulse. I cannot palpate pedal pulses. He has a markedly dampened peroneal signal on the right with a Doppler. On the left side he has a palpable femoral pulse. He has a brisk peroneal signal with the Doppler on the left. PULMONARY: There is good air exchange bilaterally without wheezing or rales. ABDOMEN: Soft and non-tender with normal pitched bowel sounds.  MUSCULOSKELETAL: There are no major deformities or cyanosis. NEUROLOGIC: No focal weakness or paresthesias are detected. SKIN: There are no ulcers or rashes noted. PSYCHIATRIC: The patient has a normal affect.  DATA:    ARTERIOGRAM: I did review the images from his arteriogram that was done in October 2017. He did have some moderate right external iliac artery disease which was fairly focal. This looked like an eccentric calcific plaque. His superficial femoral artery was occluded at its origin and he had reconstitution of the peroneal artery only.  ARTERIAL DOPPLER STUDY: I did review his arterial Doppler study from 06/14/2017. This showed monophasic Doppler signals in the right foot with an ABI of 51%. Toe pressure on the right was 113 mmHg. He had biphasic Doppler signals in the left foot with 100%.  MEDICAL ISSUES:   PERIPHERAL VASCULAR DISEASE WITH CLAUDICATION RIGHT LOWER EXTREMITY: This symptoms the right leg appear to have progressed. It is difficult to determine how much of this is related to his back and how much is related to his peripheral vascular disease. He has multilevel arterial occlusive disease. He has inflow disease involving his right external iliac artery. In addition he has infrainguinal arterial occlusive disease and severe tibial artery occlusive disease. Given  that his symptoms are quite disabling he would like to pursue arteriography.  I have reviewed with the patient the indications for arteriography. In addition, I have reviewed the potential complications of arteriography including but not limited to: Bleeding, arterial injury, arterial thrombosis, dye action, renal insufficiency, or other unpredictable medical problems. I have explained to the patient that if we find disease amenable to angioplasty we could potentially address this at the same time. I have discussed the potential complications of angioplasty and stenting, including but not limited to: Bleeding, arterial thrombosis, arterial injury, dissection, or the need for surgical intervention.  His procedure has been scheduled for 07/24/2017.  Deitra Mayo [facility] [phone]

## 2017-07-12 ENCOUNTER — Ambulatory Visit (INDEPENDENT_AMBULATORY_CARE_PROVIDER_SITE_OTHER): Payer: Medicare Other | Admitting: Family Medicine

## 2017-07-12 ENCOUNTER — Encounter: Payer: Self-pay | Admitting: Family Medicine

## 2017-07-12 DIAGNOSIS — M545 Low back pain, unspecified: Secondary | ICD-10-CM

## 2017-07-12 DIAGNOSIS — I70221 Atherosclerosis of native arteries of extremities with rest pain, right leg: Secondary | ICD-10-CM

## 2017-07-12 MED ORDER — CYCLOBENZAPRINE HCL 10 MG PO TABS: 10.0000 mg | ORAL_TABLET | Freq: Two times a day (BID) | ORAL | 0 refills | Status: AC

## 2017-07-12 MED ORDER — DICLOFENAC SODIUM 75 MG PO TBEC: 75.0000 mg | DELAYED_RELEASE_TABLET | Freq: Two times a day (BID) | ORAL | 0 refills | Status: AC

## 2017-07-12 NOTE — Progress Notes (Signed)
Subjective:    Patient ID: [name] , male   DOB: 04-21-1951 , 66 y.o..   MRN: [id number]  HPI  [name] is here for  Chief Complaint  Patient presents with  . Back Pain    4 weeks    1. BACK PAIN  Back pain began 2 months ago. Pain is described as constant. Patient has tried nothing. Pain radiates down right leg. History of trauma or injury: Had cervical spine surgery in the past.  Prior history of similar pain: yes, he had had lower back pain in the past 10 or 12 years ago- had an on the job injury after lifting boxes. Had a lumbar fusion History of cancer: No  Weak immune system:  No  History of IV drug use: No History of steroid use: No   Symptoms Incontinence of bowel or bladder: No  Numbness of leg: No Fever: No Rest or Night pain: Pain at rest and at night  Weight Loss:  No  Rash: No   Patient unsure what might be causing their pain.  ROS see HPI Smoking Status noted.   Review of Systems: Per HPI.   Past Medical History: Patient Active Problem List   Diagnosis Date Noted  . Radiculopathy, cervical region 02/11/2017  . Depression 10/03/2016  . Back pain 06/24/2016  . Vision changes 02/02/2016  . Claudication of both lower extremities ([place]) 07/15/2015  . Proteinuria 08/27/2014  . Diabetic neuropathy ([place]) 02/27/2014  . Iron deficiency anemia 08/01/2012  . Diabetes mellitus type II, controlled ([place]) 06/18/2009  . GERD 01/14/2009  . HYPERCHOLESTEROLEMIA 02/22/2007  . OBESITY, NOS 02/22/2007  . TOBACCO DEPENDENCE 02/22/2007  . HYPERTENSION, BENIGN SYSTEMIC 02/22/2007  . PAD (peripheral artery disease) ([place]) 02/22/2007  . OSTEOARTHRITIS, MULTI SITES 02/22/2007    Medications: reviewed and updated Current Outpatient Prescriptions  Medication Sig Dispense Refill  . acetaminophen (TYLENOL) 500 MG tablet Take 1,000 mg by mouth 3 (three) times daily as needed for moderate pain.    Marland Kitchen aspirin EC 81 MG tablet Take 162 mg by mouth daily.    Marland Kitchen  atorvastatin (LIPITOR) 40 MG tablet Take 1 tablet (40 mg total) by mouth daily. 90 tablet 3  . benazepril (LOTENSIN) 5 MG tablet Take 1 tablet (5 mg total) by mouth daily. 90 tablet 3  . clopidogrel (PLAVIX) 75 MG tablet TAKE 1 TABLET BY MOUTH ONCE DAILY 30 tablet 5  . cyclobenzaprine (FLEXERIL) 10 MG tablet Take 1 tablet (10 mg total) by mouth 2 (two) times daily. 14 tablet 0  . dextromethorphan-guaiFENesin (MUCINEX DM) 30-600 MG 12hr tablet Take 1 tablet by mouth 2 (two) times daily as needed for cough. 30 tablet 0  . diclofenac (VOLTAREN) 75 MG EC tablet Take 1 tablet (75 mg total) by mouth 2 (two) times daily. 30 tablet 0  . docusate sodium (COLACE) 100 MG capsule Take 1 capsule (100 mg total) by mouth 2 (two) times daily. 60 capsule 2  . ezetimibe (ZETIA) 10 MG tablet Take 1 tablet (10 mg total) by mouth daily. 90 tablet 3  . FLUoxetine (PROZAC) 10 MG capsule     . gabapentin (NEURONTIN) 800 MG tablet TAKE 1.5 TABLETS (1,200 MG TOTAL) BY MOUTH 3 (THREE) TIMES DAILY. 135 tablet 11  . metFORMIN (GLUCOPHAGE) 850 MG tablet Take 1 tablet (850 mg total) by mouth daily with breakfast. 90 tablet 1  . Nebivolol HCl (BYSTOLIC) 20 MG TABS Take 1 tablet (20 mg total) by mouth daily. 30 tablet  11  . pantoprazole (PROTONIX) 40 MG tablet TAKE 1 TABLET BY MOUTH ONCE DAILY 30 tablet 5  . Phenylephrine-DM-GG (MUCINEX FAST-MAX CONGEST COUGH) 2.5-5-100 MG/5ML LIQD Take 30 mLs by mouth every 12 (twelve) hours as needed (cough).     No current facility-administered medications for this visit.     Social Hx:  reports that he has been smoking Cigars.  He has smoked for the past 24.00 years. He has never used smokeless tobacco.   Objective:   BP 110/62 (BP Location: Left Arm, Patient Position: Sitting, Cuff Size: Large)   Pulse 72   Temp (!) 97.4 F (36.3 C) (Oral)   Ht 5\' 7"  (1.702 m)   Wt 210 lb (95.3 kg)   SpO2 98%   BMI 32.89 kg/m  Physical Exam  Gen: NAD, alert, cooperative with exam,  well-appearing Back Exam:  Inspection: Well-healed midline incisional scar over lumbar area, otherwise unremarkable Palpable tenderness: Tenderness to palpation over right paraspinal lumbar area Range of Motion: Limited flexion and extension secondary to pain, able to do lateral movements without difficulty Leg strength: Quad: 5/5 Hamstring: 5/5 Hip flexor: 5/5 Hip abductors: 5/5  Strength at foot: Plantar-flexion: 5/5 Dorsi-flexion: 5/5 Eversion: 5/5 Inversion: 5/5  Sensory change: Gross sensation intact to all lumbar and sacral dermatomes.  Reflexes: 1+ bilaterally patellar  Gait antalgic and walking with a cane   Assessment & Plan:  Back pain Acute flareup of low back pain which has waxed and waned in nature since patient was injured on the job 10-12 years ago. History of lumbar fusion per patient report. Offered patient a Toradol shot which he declined. He preferred to use oral treatment. No red flag symptoms. -Prescription for diclofenac 75 mg twice a day and Flexeril 10 mg twice a day to be used 7 days -Will return to clinic if symptoms fail to improve as anticipated  -Discussed reasons to go to the emergency Department  Meds ordered this encounter  Medications  . cyclobenzaprine (FLEXERIL) 10 MG tablet    Sig: Take 1 tablet (10 mg total) by mouth 2 (two) times daily.    Dispense:  14 tablet    Refill:  0  . diclofenac (VOLTAREN) 75 MG EC tablet    Sig: Take 1 tablet (75 mg total) by mouth 2 (two) times daily.    Dispense:  30 tablet    Refill:  0    Smitty Cords, MD [place], PGY-3

## 2017-07-12 NOTE — Patient Instructions (Signed)
Thank you for coming in today, it was so nice to see you! Today we talked about:    Low back pain; it seems like you having back pain due to a muscle spasm. I would like for you to take the muscle relaxer and the anti-inflammatory medicine as prescribed for the next week. He continued to have pain please come back and see [place].  Reasons to go to the hospital would be if you cannot walk, you have numbness or tingling, you lose control of her bowels or bladder   If you have any questions or concerns, please do not hesitate to call the office at [phone]. You can also message me directly via MyChart.   Sincerely,  Smitty Cords, MD

## 2017-07-14 NOTE — Assessment & Plan Note (Addendum)
Acute flareup of low back pain which has waxed and waned in nature since patient was injured on the job 10-12 years ago. History of lumbar fusion per patient report. Offered patient a Toradol shot which he declined. He preferred to use oral treatment. No red flag symptoms. -Prescription for diclofenac 75 mg twice a day and Flexeril 10 mg twice a day to be used 7 days -Will return to clinic if symptoms fail to improve as anticipated  -Discussed reasons to go to the emergency Department

## 2017-07-19 ENCOUNTER — Other Ambulatory Visit: Payer: Self-pay

## 2017-07-19 ENCOUNTER — Telehealth: Payer: Self-pay

## 2017-07-19 NOTE — Telephone Encounter (Signed)
Pt. called to cancel the Aortogram of 7/30.  Reported his PCP has started him on Diclofenac and Cyclobenzaprine for back and leg pain.  Stated he wants to give the medication time to see if it makes a difference in his symptoms, before going through with the angiogram.  Advised pt. that he should call back to reschedule if the symptoms worsen.  Verb. understanding.

## 2017-07-24 ENCOUNTER — Encounter (HOSPITAL_COMMUNITY): Payer: Self-pay

## 2017-07-24 ENCOUNTER — Ambulatory Visit (HOSPITAL_COMMUNITY): Admit: 2017-07-24 | Payer: Medicare Other | Admitting: Vascular Surgery

## 2017-07-24 SURGERY — ABDOMINAL AORTOGRAM W/LOWER EXTREMITY
Anesthesia: LOCAL

## 2017-07-28 ENCOUNTER — Telehealth: Payer: Self-pay | Admitting: Internal Medicine

## 2017-07-28 NOTE — Telephone Encounter (Signed)
Unsuccessful contact with pt. Left VM inquiring about eye exam.  Have you had a dilated eye exam in the past year? If yes. Where have you received your exam? If no, ask if they are followed by ophthalmology - recommend one if not followed. - Mesha Guinyard

## 2017-08-08 ENCOUNTER — Other Ambulatory Visit: Payer: Self-pay | Admitting: Family Medicine

## 2017-08-14 ENCOUNTER — Other Ambulatory Visit: Payer: Self-pay | Admitting: Family Medicine

## 2017-08-16 MED ORDER — DICLOFENAC SODIUM 75 MG PO TBEC: 75.0000 mg | DELAYED_RELEASE_TABLET | Freq: Two times a day (BID) | ORAL | 0 refills | Status: DC

## 2017-08-16 NOTE — Telephone Encounter (Signed)
Appt made for 9/10. Can he get enough medication to to get him to his appt date? Ottis Stain, CMA

## 2017-08-16 NOTE — Addendum Note (Signed)
Addended by: Darci Needle on: 08/16/2017 10:53 AM   Modules accepted: Orders

## 2017-08-16 NOTE — Telephone Encounter (Signed)
Placed refill. Will plan on discussing non-NSAID alternatives for long-term treatment.

## 2017-09-04 ENCOUNTER — Encounter: Payer: Medicare Other | Admitting: Internal Medicine

## 2017-09-06 ENCOUNTER — Other Ambulatory Visit: Payer: Self-pay | Admitting: Family Medicine

## 2017-09-18 ENCOUNTER — Other Ambulatory Visit: Payer: Self-pay | Admitting: Internal Medicine

## 2017-09-27 ENCOUNTER — Other Ambulatory Visit: Payer: Self-pay | Admitting: Family Medicine

## 2017-09-28 ENCOUNTER — Other Ambulatory Visit: Payer: Self-pay | Admitting: Family Medicine

## 2017-10-09 ENCOUNTER — Ambulatory Visit (INDEPENDENT_AMBULATORY_CARE_PROVIDER_SITE_OTHER): Payer: Medicare Other | Admitting: *Deleted

## 2017-10-09 DIAGNOSIS — Z23 Encounter for immunization: Secondary | ICD-10-CM | POA: Diagnosis present

## 2017-10-09 NOTE — Progress Notes (Signed)
   Jearld Adjutant presents for immunizations.    Screening questions for immunizations: 1. Are you sick today?  no 2. Do you have allergies to medications, foods, or any vaccines?  no 3. Have you ever had a serious reaction after receiving a vaccination?  no 4. Do you have a long-term health problem with heart disease, asthma, lung disease, kidney disease, metabolic disease (e.g. diabetes), anemia, or other blood disorder?  no 5. Have you had a seizure, brain problem, or other nervous system problem?  no 6. Do you have cancer, leukemia, AIDS, or any other immune system problem?  no 7. Do you take cortisone, prednisone, other steroids, anticancer drugs or have you had radiation treatments?  no 8. Have you received a transfusion of blood or blood products, or been given immune (gamma) globulin or an antiviral drug in the past year?  no 9. Have you received vaccinations in the past 4 weeks?  no 10. FEMALES ONLY: Are you pregnant or is there a chance you could become pregnant during the next month?  no   Derl Barrow, RN

## 2017-10-30 ENCOUNTER — Other Ambulatory Visit: Payer: Self-pay | Admitting: Family Medicine

## 2017-10-30 DIAGNOSIS — I10 Essential (primary) hypertension: Secondary | ICD-10-CM

## 2017-10-31 ENCOUNTER — Telehealth: Payer: Self-pay | Admitting: Internal Medicine

## 2017-10-31 MED ORDER — CYCLOBENZAPRINE HCL 10 MG PO TABS: ORAL_TABLET | ORAL | 3 refills | Status: DC

## 2017-10-31 MED ORDER — CLOPIDOGREL BISULFATE 75 MG PO TABS: 75.0000 mg | ORAL_TABLET | Freq: Every day | ORAL | 5 refills | Status: DC

## 2017-10-31 MED ORDER — METFORMIN HCL 850 MG PO TABS: 850.0000 mg | ORAL_TABLET | Freq: Every day | ORAL | 1 refills | Status: DC

## 2017-10-31 NOTE — Telephone Encounter (Signed)
Patient had refills denied for: Metformin, Clopidogrel, and Cyclobenzaprine and needs refilled to [facility], phone # [phone].  Any questions, please call # [phone]

## 2017-10-31 NOTE — Telephone Encounter (Signed)
Placed refill orders to [facility] as requested.  Olene Floss, MD [place], PGY-3

## 2017-11-29 ENCOUNTER — Other Ambulatory Visit: Payer: Self-pay | Admitting: Family Medicine

## 2017-12-06 ENCOUNTER — Encounter: Payer: Self-pay | Admitting: Internal Medicine

## 2017-12-06 ENCOUNTER — Ambulatory Visit (INDEPENDENT_AMBULATORY_CARE_PROVIDER_SITE_OTHER): Payer: Medicare Other | Admitting: Internal Medicine

## 2017-12-06 VITALS — BP 115/78 | HR 95 | Temp 98.1°F | Wt 208.8 lb

## 2017-12-06 DIAGNOSIS — I10 Essential (primary) hypertension: Secondary | ICD-10-CM | POA: Diagnosis not present

## 2017-12-06 DIAGNOSIS — M79604 Pain in right leg: Secondary | ICD-10-CM | POA: Diagnosis not present

## 2017-12-06 DIAGNOSIS — E119 Type 2 diabetes mellitus without complications: Secondary | ICD-10-CM | POA: Diagnosis present

## 2017-12-06 DIAGNOSIS — I779 Disorder of arteries and arterioles, unspecified: Secondary | ICD-10-CM

## 2017-12-06 DIAGNOSIS — Z720 Tobacco use: Secondary | ICD-10-CM | POA: Diagnosis not present

## 2017-12-06 LAB — POCT GLYCOSYLATED HEMOGLOBIN (HGB A1C): Hemoglobin A1C: 6.7

## 2017-12-06 MED ORDER — TRAMADOL HCL 50 MG PO TABS: 50.0000 mg | ORAL_TABLET | Freq: Two times a day (BID) | ORAL | 1 refills | Status: DC | PRN

## 2017-12-06 MED ORDER — BENAZEPRIL HCL 5 MG PO TABS: 5.0000 mg | ORAL_TABLET | Freq: Every day | ORAL | 0 refills | Status: DC

## 2017-12-06 MED ORDER — NEBIVOLOL HCL 20 MG PO TABS: 20.0000 mg | ORAL_TABLET | Freq: Every day | ORAL | 0 refills | Status: DC

## 2017-12-06 MED ORDER — METFORMIN HCL 1000 MG PO TABS: 1000.0000 mg | ORAL_TABLET | Freq: Two times a day (BID) | ORAL | 0 refills | Status: DC

## 2017-12-06 MED ORDER — EZETIMIBE 10 MG PO TABS: 10.0000 mg | ORAL_TABLET | Freq: Every day | ORAL | 0 refills | Status: DC

## 2017-12-06 MED ORDER — FLUOXETINE HCL 10 MG PO CAPS: 10.0000 mg | ORAL_CAPSULE | Freq: Every day | ORAL | 0 refills | Status: DC

## 2017-12-06 MED ORDER — PANTOPRAZOLE SODIUM 40 MG PO TBEC: 40.0000 mg | DELAYED_RELEASE_TABLET | Freq: Every day | ORAL | 0 refills | Status: DC

## 2017-12-06 NOTE — Patient Instructions (Signed)
Mr. Keaney,  Thank you for coming in. Your blood sugar looks well controlled. I will increase your metformin to 1000 mg twice a day with meals.  I placed some refills to [facility]. I will put future refills to your mail order pharmacy.  I will call Dr. Nicole Cella office to see if they could make your surgery sooner. Continue gabapentin for pain. I have prescribed tramadol for pain as well for the short term.  I will refer you to an ophthalmologist for diabetic eye exam.  You are due for a colonoscopy.  Best, Dr. Ola Spurr

## 2017-12-08 ENCOUNTER — Encounter: Payer: Self-pay | Admitting: Family

## 2017-12-08 ENCOUNTER — Other Ambulatory Visit: Payer: Self-pay | Admitting: *Deleted

## 2017-12-08 ENCOUNTER — Encounter: Payer: Self-pay | Admitting: *Deleted

## 2017-12-08 ENCOUNTER — Ambulatory Visit (INDEPENDENT_AMBULATORY_CARE_PROVIDER_SITE_OTHER): Payer: Medicare Other | Admitting: Family

## 2017-12-08 ENCOUNTER — Ambulatory Visit (HOSPITAL_COMMUNITY)
Admission: RE | Admit: 2017-12-08 | Discharge: 2017-12-08 | Disposition: A | Discharge status: Home / Self Care | Payer: Medicare Other | Source: Ambulatory Visit | Attending: Family | Admitting: Family

## 2017-12-08 VITALS — BP 108/64 | HR 81 | Temp 96.8°F | Resp 19 | Wt 208.1 lb

## 2017-12-08 DIAGNOSIS — F17290 Nicotine dependence, other tobacco product, uncomplicated: Secondary | ICD-10-CM | POA: Diagnosis not present

## 2017-12-08 DIAGNOSIS — I779 Disorder of arteries and arterioles, unspecified: Secondary | ICD-10-CM | POA: Diagnosis not present

## 2017-12-08 DIAGNOSIS — M79661 Pain in right lower leg: Secondary | ICD-10-CM

## 2017-12-08 NOTE — Progress Notes (Signed)
[facility] [place]   CC: Follow up peripheral artery occlusive disease  History of Present Illness [name] is a 66 y.o. male who is s/p left femoral to peroneal artery bypass with PTFE on 11/11/2016 by Dr. Scot [name] for rest pain and a nonhealing wound on the dorsum of his left foot. He had previous undergone a left femoral to below-knee popliteal artery bypass by Dr. Kellie [name] which was occluded. He was seen in our office by the physician's assistant on 06/14/2017. At that time, he denied any issues with the left leg but was complaining of pain in the right lower extremity area this had been going on for 1 year but have gotten worse over the last few months.  Noninvasive study on 06/14/2017 showed an ABI the 100% on the left and 50% on the right. The patient had a previous arteriogram in October 2017 which showed on the right side it was a 40% stenosis in the right external iliac artery with occlusion of the right superficial femoral artery and right popliteal artery. The right anterior tibial and posterior tibial arteries were occluded. There was single-vessel runoff on the right via the peroneal artery. This arteriogram was 9 months ago.  When Dr. Scot [name] saw pt on 07-06-17, pt described pain in his right lower extremity from the foot all the way up to the hip. This occured with ambulation but also at rest. He had some back pain. He denied any claudication in the left lower extremity. He denied any rest pain. He denied any history of nonhealing ulcers.  Dr. Scot [name] last evaluated pt on 07-06-17. At that time arteriogram done in October 2017. He did have some moderate right external iliac artery disease which was fairly focal. This looked like an eccentric calcific plaque. His superficial femoral artery was occluded at its origin and he had reconstitution of the peroneal artery only. Arterial Doppler study from 06/14/2017 showed monophasic Doppler signals in the right foot with  an ABI of 51%. Toe pressure on the right was 113 mmHg. He had biphasic Doppler signals in the left foot with 100%. Symptoms in the right leg appear to have progressed. It was difficult to determine how much of this is related to his back and how much is related to his peripheral vascular disease. He has multilevel arterial occlusive disease. He has inflow disease involving his right external iliac artery. In addition he has infrainguinal arterial occlusive disease and severe tibial artery occlusive disease. Given that his symptoms are quite disabling he would like to pursue arteriography.  At the July 2018 visit Dr. Scot [name] reviewed with the patient the indications for arteriography. In addition, he reviewed the potential complications of arteriography including but not limited to: Bleeding, arterial injury, arterial thrombosis, dye action, renal insufficiency, or other unpredictable medical problems. Dr. Scot [name] explained to the patient that if we find disease amenable to angioplasty we could potentially address this at the same time.  His procedure had been scheduled for 07/24/2017. On 07-19-17 pt called to cancel the Aortogram of 07/24/17.  Reported his PCP has started him on Diclofenac and Cyclobenzaprine for back and leg pain.  Stated he wanted to give the medication time to see if it makes a difference in his symptoms, before going through with the angiogram.  He returns today at the request of Dr.Hillary Ola [name], [place] re worsening right posterior calf pain that started about a month and a half ago, also has right hip and low back  pain, that hurts walking or sitting. Pt states his back has been evaluated, states he had lumbar spine surgery a few years ago, states the pain in his right calf feels like the pain before his back surgery. No recent imaging results on file for his spine.  He denies any dyspnea.    He denies any hx of stroke or TIA. He denies any heart problems. He  does continue to smoke cigars but does not smoke cigarettes and does not inhale.  Serum creatinine (0.99) and eGFR (>60)were normal on 04-01-17, most recent results.    Diabetic: Yes, 6.7 A1C on 12-06-17 Tobacco use: smoker  (he states 1 cigar/day, started in his teens)  Pt meds include: Statin :Yes Betablocker: Yes ASA: Yes Other anticoagulants/antiplatelets: Plavix  Past Medical History:  Diagnosis Date  . Anemia   . Arthritis    OA  . Cervical radiculopathy    Dr. Vertell [name] neurosurgery  . Chronic lower back pain   . Diabetes mellitus    takes Metformin daily  . GERD (gastroesophageal reflux disease)    takes Protonix daily  . History of blood transfusion    "related to low HgB" ((09/10/2015  . Hyperlipidemia    takes Vytorin daily  . Hypertension    takes Benazepril and Bystolic daily  . PAD (peripheral artery disease) ([place])   . Pneumonia   . Shortness of breath dyspnea    with exertion  . Tobacco user   . Toe fracture, right 05/09/2011    Social History Social History   Tobacco Use  . Smoking status: Current Some Day Smoker    Years: 24.00    Types: Cigars  . Smokeless tobacco: Never Used  . Tobacco comment: smokes 1-2 cigars per day   Substance Use Topics  . Alcohol use: Yes    Alcohol/week: 1.2 oz    Types: 2 Cans of beer per week  . Drug use: No    Family History Family History  Problem Relation Age of Onset  . Cancer Mother        colon Cancer  . Hyperlipidemia Mother   . Diabetes Mother   . Heart disease Father   . Hypertension Father   . Cancer Sister        Uterine  . Diabetes Sister   . Diabetes Brother     Past Surgical History:  Procedure Laterality Date  . ANTERIOR CERVICAL DECOMP/DISCECTOMY FUSION  03/08/12   C6-7  . ANTERIOR CERVICAL DECOMP/DISCECTOMY FUSION  03/08/2012   Procedure: ANTERIOR CERVICAL DECOMPRESSION/DISCECTOMY FUSION 1 LEVEL/HARDWARE REMOVAL;  Surgeon: Erline Levine, MD;  Location: [facility];  Service: Neurosurgery;   Laterality: N/A;  revison of C5-7 anterior cervical decompression with fusion with Cervical Five-Thoracic One anterior cervical decompression with fusion with interbody prothesis plating and bonegraft  . BACK SURGERY  1996  . BYPASS GRAFT FEMORAL-PERONEAL Left 11/11/2016   Procedure: REDO LEFT FEMORAL-PERONEAL BYPASS WITH PROPATEN 6MM X 80CM GRAFT;  Surgeon: Angelia Mould, MD;  [facility];  Service: Vascular;  Laterality: Left;  . COLONOSCOPY W/ BIOPSIES AND POLYPECTOMY  08/17/2012   f/u 5 years, 4 polyps, no high grade dysplasia or malignancy, tubular adenoma, hyperplastic polyops  . ENTEROSCOPY N/A 12/11/2015   Procedure: ENTEROSCOPY;  Surgeon: Carol Ada, MD;  Location: [facility] ENDOSCOPY;  Service: Endoscopy;  Laterality: N/A;  . ESOPHAGOGASTRODUODENOSCOPY  08/17/2012   normal esophagus and GEJ, diffuse gastritis with erythema- no malignancy, reactive gastropathy  with focal intestinal metaplasia  . FEMORAL-POPLITEAL BYPASS  GRAFT Left 01/06/2016   Procedure: Left  COMMON FEMORAL-BELOW KNEE POPLITEAL ARTERY Bypass using non-reversed translocated saphenous vein graft from left leg;  Surgeon: Mal Misty, MD;  Location: [place];  Service: Vascular;  Laterality: Left;  . GIVENS CAPSULE STUDY N/A 11/24/2015   Procedure: GIVENS CAPSULE STUDY;  Surgeon: Juanita Craver, MD;  Location: [place];  Service: Endoscopy;  Laterality: N/A;  . INGUINAL HERNIA REPAIR  1990's   right  . INTRAOPERATIVE ARTERIOGRAM Left 01/06/2016   Procedure: INTRA OPERATIVE ARTERIOGRAM LEFT LOWER LEG;  Surgeon: Mal Misty, MD;  Location: [place];  Service: Vascular;  Laterality: Left;  . INTRAOPERATIVE ARTERIOGRAM Left 11/11/2016   Procedure: INTRA OPERATIVE ARTERIOGRAM LEFT LOWER EXTRIMITY;  Surgeon: Angelia Mould, MD;  Location: [place];  Service: Vascular;  Laterality: Left;  . IR GENERIC HISTORICAL  10/24/2016   IR ANGIOGRAM FOLLOW UP STUDY  . LOWER EXTREMITY ANGIOGRAM Bilateral 07/30/2015   Procedure:  Lower Extremity Angiogram;  Surgeon: Conrad [place], MD;  Location: [place] CV LAB;  Service: Cardiovascular;  Laterality: Bilateral;  . [place]   "lower"  . PERIPHERAL VASCULAR CATHETERIZATION N/A 07/30/2015   Procedure: Abdominal Aortogram;  Surgeon: Conrad , MD;  Location: [place] CV LAB;  Service: Cardiovascular;  Laterality: N/A;  . PERIPHERAL VASCULAR CATHETERIZATION N/A 10/24/2016   Procedure: Abdominal Aortogram;  Surgeon: Angelia Mould, MD;  Location: [place] CV LAB;  Service: Cardiovascular;  Laterality: N/A;  . PERIPHERAL VASCULAR CATHETERIZATION N/A 10/24/2016   Procedure: Lower Extremity Angiography;  Surgeon: Angelia Mould, MD;  Location: [place] CV LAB;  Service: Cardiovascular;  Laterality: N/A;  . VASCULAR SURGERY  ~ 2007   Stent SFA   . VEIN HARVEST Left 01/06/2016   Procedure: LEFT GREATER SAPPHENOUS VEIN HARVEST;  Surgeon: Mal Misty, MD;  Location: [place];  Service: Vascular;  Laterality: Left;    Allergies  Allergen Reactions  . Glipizide Other (See Comments)    REACTION IS SIDE EFFECT Severe hypoglycemia to 40s.     Current Outpatient Medications  Medication Sig Dispense Refill  . acetaminophen (TYLENOL) 500 MG tablet Take 1,000 mg by mouth 3 (three) times daily as needed for moderate pain.    [place] Kitchen aspirin EC 81 MG tablet Take 162 mg by mouth daily.    Mar[place] Kitchen atorvastatin (LIPITOR) 40 MG tablet Take 1 tablet (40 mg total) by mouth daily. 90 tablet 3  . benazepril (LOTENSIN) 5 MG tablet Take 1 tablet (5 mg total) by mouth daily. 30 tablet 0  . clopidogrel (PLAVIX) 75 MG tablet Take 1 tablet (75 mg total) daily by mouth. 30 tablet 5  . cyclobenzaprine (FLEXERIL) 10 MG tablet TAKE ONE  1  TABLET BY MOUTH THREE TIMES DAILY AS NEEDED FOR MUSCLE SPASMS 30 tablet 3  . dextromethorphan-guaiFENesin (MUCINEX DM) 30-600 MG 12hr tablet Take 1 tablet by mouth 2 (two) times daily as needed for cough. 30 tablet 0  . diclofenac  (VOLTAREN) 75 MG EC tablet Take 1 tablet (75 mg total) by mouth 2 (two) times daily. 30 tablet 0  . docusate sodium (COLACE) 100 MG capsule Take 1 capsule (100 mg total) by mouth 2 (two) times daily. 60 capsule 2  . ezetimibe (ZETIA) 10 MG tablet Take 1 tablet (10 mg total) by mouth daily. 30 tablet 0  . FLUoxetine (PROZAC) 10 MG capsule Take 1 capsule (10 mg total) by mouth daily. 30 capsule 0  . gabapentin (NEURONTIN) 800  MG tablet TAKE 1.5 TABLETS (1,200 MG TOTAL) BY MOUTH 3 (THREE) TIMES DAILY. 135 tablet 11  . metFORMIN (GLUCOPHAGE) 1000 MG tablet Take 1 tablet (1,000 mg total) by mouth 2 (two) times daily with a meal. 60 tablet 0  . Nebivolol HCl (BYSTOLIC) 20 MG TABS Take 1 tablet (20 mg total) by mouth daily. 30 tablet 0  . pantoprazole (PROTONIX) 40 MG tablet Take 1 tablet (40 mg total) by mouth daily. 30 tablet 0  . Phenylephrine-DM-GG (MUCINEX FAST-MAX CONGEST COUGH) 2.5-5-100 MG/5ML LIQD Take 30 mLs by mouth every 12 (twelve) hours as needed (cough).    . traMADol (ULTRAM) 50 MG tablet Take 1 tablet (50 mg total) by mouth every 12 (twelve) hours as needed for severe pain. 30 tablet 1   No current facility-administered medications for this visit.     ROS: See HPI for pertinent positives and negatives.   Physical Examination  Vitals:   12/08/17 0907 12/08/17 0909  BP: (!) 108/59 108/64  Pulse: 81   Resp: 19   Temp: (!) 96.8 F (36 C)   TempSrc: Oral   SpO2: 100%   Weight: 208 lb 1.6 oz (94.4 kg)    Body mass index is 32.59 kg/m.  General: A&O x 3, WDWN, obese male, odor of tobacco smoke. Gait: antalgic Eyes: PERRLA. Pulmonary: Respirations are non labored, CTAB, fair air movement Cardiac: regular Rhythm, no detected murmur.         Carotid Bruits Right Left   Positive Positive   Radial pulses are 2+ palpable bilaterally   Adominal aortic pulse is not palpable                         VASCULAR EXAM: Extremities without ischemic changes, without Gangrene;  without open wounds.Right toes are slightly cooler than left toes. Few varicosities in both lower legs.                                                                                                           LE Pulses Right Left       FEMORAL  Faint Doppler signal  2+ palpable        POPLITEAL  not palpable   not palpable       POSTERIOR TIBIAL  non-Dopplerable   non-Dopplerable         DORSALIS PEDIS      ANTERIOR TIBIAL non-Dopplerable  faint Doppler signal        PERONEAL non-Dopplerable   Faint Doppler signal    Abdomen: soft, NT, no palpable masses. Skin: no rashes, no ulcers noted. Musculoskeletal: no muscle wasting or atrophy. Tenderness to palpitation at posterior right calf, + Homans' sign right calf.  Neurologic: A&O X 3; Appropriate Affect ; SENSATION: normal; MOTOR FUNCTION:  moving all extremities equally, motor strength 5/5 throughout. Speech is fluent/normal. CN 2-12 intact.    ASSESSMENT: [name] is a 66 y.o. male who is s/p left femoral to peroneal artery bypass with PTFE on 11/11/2016 by Dr. Scot [name] for  rest pain and a nonhealing wound on the dorsum of his left foot. He had previous undergone a left femoral to below-knee popliteal artery bypass by Dr. Kellie [name] which was occluded.  Bilateral carotid bruits, no hx of stroke or TIA, no carotid duple result on file. Tenderness right calf with + Homans's sign, will try to fit in DVT study today of right LE: no DVT.   DATA  Venous Duplex Right leg (12-08-17): No DVT, no SVT in the right leg.   PLAN:  Based on the patient's vascular studies and examination, and after discussing with Dr. Bridgett Larsson, pt will be scheduled for an arteriogram on 12-11-17, Dr. Scot [name].  He has bilateral carotid bruits I discussed in depth with the patient the nature of atherosclerosis, and emphasized the importance of maximal medical management including strict control of blood pressure, blood glucose, and lipid levels, obtaining regular  exercise, and cessation of smoking.  The patient is aware that without maximal medical management the underlying atherosclerotic disease process will progress, limiting the benefit of any interventions.  The patient was given information about PAD including signs, symptoms, treatment, what symptoms should prompt the patient to seek immediate medical care, and risk reduction measures to take.  Clemon Chambers, RN, MSN, FNP-C [facility] Phone: [phone]  Clinic MD: Chen/Chen  12/08/17 9:21 AM

## 2017-12-08 NOTE — Patient Instructions (Signed)
Steps to Quit Smoking Smoking tobacco can be bad for your health. It can also affect almost every organ in your body. Smoking puts you and people around you at risk for many serious long-lasting (chronic) diseases. Quitting smoking is hard, but it is one of the best things that you can do for your health. It is never too late to quit. What are the benefits of quitting smoking? When you quit smoking, you lower your risk for getting serious diseases and conditions. They can include:  Lung cancer or lung disease.  Heart disease.  Stroke.  Heart attack.  Not being able to have children (infertility).  Weak bones (osteoporosis) and broken bones (fractures).  If you have coughing, wheezing, and shortness of breath, those symptoms may get better when you quit. You may also get sick less often. If you are pregnant, quitting smoking can help to lower your chances of having a baby of low birth weight. What can I do to help me quit smoking? Talk with your doctor about what can help you quit smoking. Some things you can do (strategies) include:  Quitting smoking totally, instead of slowly cutting back how much you smoke over a period of time.  Going to in-person counseling. You are more likely to quit if you go to many counseling sessions.  Using resources and support systems, such as: ? Online chats with a counselor. ? Phone quitlines. ? Printed self-help materials. ? Support groups or group counseling. ? Text messaging programs. ? Mobile phone apps or applications.  Taking medicines. Some of these medicines may have nicotine in them. If you are pregnant or breastfeeding, do not take any medicines to quit smoking unless your doctor says it is okay. Talk with your doctor about counseling or other things that can help you.  Talk with your doctor about using more than one strategy at the same time, such as taking medicines while you are also going to in-person counseling. This can help make  quitting easier. What things can I do to make it easier to quit? Quitting smoking might feel very hard at first, but there is a lot that you can do to make it easier. Take these steps:  Talk to your family and friends. Ask them to support and encourage you.  Call phone quitlines, reach out to support groups, or work with a counselor.  Ask people who smoke to not smoke around you.  Avoid places that make you want (trigger) to smoke, such as: ? Bars. ? Parties. ? Smoke-break areas at work.  Spend time with people who do not smoke.  Lower the stress in your life. Stress can make you want to smoke. Try these things to help your stress: ? Getting regular exercise. ? Deep-breathing exercises. ? Yoga. ? Meditating. ? Doing a body scan. To do this, close your eyes, focus on one area of your body at a time from head to toe, and notice which parts of your body are tense. Try to relax the muscles in those areas.  Download or buy apps on your mobile phone or tablet that can help you stick to your quit plan. There are many free apps, such as QuitGuide from the CDC (Centers for Disease Control and Prevention). You can find more support from smokefree.gov and other websites.  This information is not intended to replace advice given to you by your health care provider. Make sure you discuss any questions you have with your health care provider. Document Released: 10/08/2009 Document   Revised: 08/09/2016 Document Reviewed: 04/28/2015 Elsevier Interactive Patient Education  2018 Elsevier Inc.     Peripheral Vascular Disease Peripheral vascular disease (PVD) is a disease of the blood vessels that are not part of your heart and brain. A simple term for PVD is poor circulation. In most cases, PVD narrows the blood vessels that carry blood from your heart to the rest of your body. This can result in a decreased supply of blood to your arms, legs, and internal organs, like your stomach or kidneys.  However, it most often affects a person's lower legs and feet. There are two types of PVD.  Organic PVD. This is the more common type. It is caused by damage to the structure of blood vessels.  Functional PVD. This is caused by conditions that make blood vessels contract and tighten (spasm).  Without treatment, PVD tends to get worse over time. PVD can also lead to acute ischemic limb. This is when an arm or limb suddenly has trouble getting enough blood. This is a medical emergency. Follow these instructions at home:  Take medicines only as told by your doctor.  Do not use any tobacco products, including cigarettes, chewing tobacco, or electronic cigarettes. If you need help quitting, ask your doctor.  Lose weight if you are overweight, and maintain a healthy weight as told by your doctor.  Eat a diet that is low in fat and cholesterol. If you need help, ask your doctor.  Exercise regularly. Ask your doctor for some good activities for you.  Take good care of your feet. ? Wear comfortable shoes that fit well. ? Check your feet often for any cuts or sores. Contact a doctor if:  You have cramps in your legs while walking.  You have leg pain when you are at rest.  You have coldness in a leg or foot.  Your skin changes.  You are unable to get or have an erection (erectile dysfunction).  You have cuts or sores on your feet that are not healing. Get help right away if:  Your arm or leg turns cold and blue.  Your arms or legs become red, warm, swollen, painful, or numb.  You have chest pain or trouble breathing.  You suddenly have weakness in your face, arm, or leg.  You become very confused or you cannot speak.  You suddenly have a very bad headache.  You suddenly cannot see. This information is not intended to replace advice given to you by your health care provider. Make sure you discuss any questions you have with your health care provider. Document Released:  03/08/2010 Document Revised: 05/19/2016 Document Reviewed: 05/22/2014 Elsevier Interactive Patient Education  2017 Elsevier Inc.  

## 2017-12-08 NOTE — Progress Notes (Signed)
[facility] Progress Note  Subjective:  [name] is a 66 y.o. male with history of T2DM, HLD, PAD with claudication who presents for diabetes f/u and R leg pain.  #Diabetes: - Taking metformin 850 mg daily without issue (no abdominal pain, diarrhea) - Has been trying to cut back on sweets - Unable to regularly exercise due to leg pain - Does not check CBGs (only on PO medication) - On benazepril, atrovastatin  - Does not have a regular eye doctor  #R leg pain: - Says has been worsening over last couple of months and unable to get appointment with his vascular surgeon Dr. Scot Dock until January - Says pain is worst in his foot from heel to toes. This makes it difficult to sleep and slows his walking down. - Had revascularization of his left leg this spring, which resolved similar symptoms in his left leg - gabapentin helps some ROS: No falls, no fever  #Tobacco abuse: - Smokes cigars and not interested in quitting   Allergies  Allergen Reactions  . Glipizide Other (See Comments)    REACTION IS SIDE EFFECT Severe hypoglycemia to 40s.     Social History   Tobacco Use  . Smoking status: Current Some Day Smoker    Years: 24.00    Types: Cigars  . Smokeless tobacco: Never Used  . Tobacco comment: smokes 1-2 cigars per day   Substance Use Topics  . Alcohol use: Yes    Alcohol/week: 1.2 oz    Types: 2 Cans of beer per week    Objective: .Blood pressure 115/78, pulse 95, temperature 98.1 F (36.7 C), temperature source Oral, weight 208 lb 12.8 oz (94.7 kg), SpO2 99 %.  Body mass index is 32.7 kg/m. Constitutional: Obese male, sitting in chair Cardiovascular: RRR, S1, S2, no m/r/g. Faint DP and PT pulses of R foot compared to easily palpable pulses of L foot.  Pulmonary/Chest: Effort normal and breath sounds normal.  Musculoskeletal: No LE edema. Tortuous veins of R calf with tenderness along all of lower leg but negative Homan's sign.  Neurological:  Decreased sensation over lateral aspect of dorsum of R foot.  Skin: R toes cool to touch compared to L foot.  Psychiatric: Normal mood and affect.  Vitals reviewed  Assessment/Plan: Type 2 diabetes mellitus without complication (HCC) - K0X well controlled at 6.7 today. Last SCr in 03/2017 < 1. Will increase metformin to 1000 mg BID given his significant PAD. Can go down to daily if has any side effects, as last dose was 850 mg. - Will refer to Ophthalmology for diabetic eye exam - Continue gabapentin  Pain of right lower extremity - Worsening PAD with changes of cool toes of R foot, leg pain, and faint DP, TP pulses. Do not suspect DVT, as pain worst in foot and negative Homan's sign.  - Called Dr. Nicole Cella office -- patient to be scheduled within next few days with evaluation by NP.  - Continue gabapentin. Provided rx for tramadol -- discussed that this is not a long-term medication. - Encouraged smoking cessation.   Tobacco abuse - Provided counseling, but patient not interested in quitting despite active consequences of vascular disease.   Follow-up after revascularization procedure. Due for colonoscopy but prefers to have scheduled after R leg pain addressed.   Olene Floss, MD [place], PGY-3

## 2017-12-10 ENCOUNTER — Other Ambulatory Visit: Payer: Self-pay | Admitting: Internal Medicine

## 2017-12-10 ENCOUNTER — Encounter: Payer: Self-pay | Admitting: Internal Medicine

## 2017-12-10 ENCOUNTER — Encounter: Payer: Self-pay | Admitting: Family

## 2017-12-10 DIAGNOSIS — M79604 Pain in right leg: Secondary | ICD-10-CM | POA: Insufficient documentation

## 2017-12-10 DIAGNOSIS — I10 Essential (primary) hypertension: Secondary | ICD-10-CM

## 2017-12-10 DIAGNOSIS — M79605 Pain in left leg: Secondary | ICD-10-CM | POA: Insufficient documentation

## 2017-12-10 MED ORDER — PANTOPRAZOLE SODIUM 40 MG PO TBEC: 40.0000 mg | DELAYED_RELEASE_TABLET | Freq: Every day | ORAL | 2 refills | Status: DC

## 2017-12-10 MED ORDER — FLUOXETINE HCL 10 MG PO CAPS: 10.0000 mg | ORAL_CAPSULE | Freq: Every day | ORAL | 2 refills | Status: DC

## 2017-12-10 MED ORDER — EZETIMIBE 10 MG PO TABS
10.0000 mg | ORAL_TABLET | Freq: Every day | ORAL | 2 refills | Status: DC
Start: 2017-12-10 — End: 2018-08-31

## 2017-12-10 MED ORDER — NEBIVOLOL HCL 20 MG PO TABS: 20.0000 mg | ORAL_TABLET | Freq: Every day | ORAL | 2 refills | Status: DC

## 2017-12-10 MED ORDER — BENAZEPRIL HCL 5 MG PO TABS: 5.0000 mg | ORAL_TABLET | Freq: Every day | ORAL | 2 refills | Status: DC

## 2017-12-10 MED ORDER — METFORMIN HCL 1000 MG PO TABS: 1000.0000 mg | ORAL_TABLET | Freq: Two times a day (BID) | ORAL | 2 refills | Status: DC

## 2017-12-10 NOTE — Assessment & Plan Note (Signed)
-   Worsening PAD with changes of cool toes of R foot, leg pain, and faint DP, TP pulses. Do not suspect DVT, as pain worst in foot and negative Homan's sign.  - Called Dr. Nicole Cella office -- patient to be scheduled within next few days with evaluation by NP.  - Continue gabapentin. Provided rx for tramadol -- discussed that this is not a long-term medication. - Encouraged smoking cessation.

## 2017-12-10 NOTE — Assessment & Plan Note (Signed)
-   A1c well controlled at 6.7 today. Last SCr in 03/2017 < 1. Will increase metformin to 1000 mg BID given his significant PAD. Can go down to daily if has any side effects, as last dose was 850 mg. - Will refer to Ophthalmology for diabetic eye exam - Continue gabapentin

## 2017-12-10 NOTE — Assessment & Plan Note (Addendum)
-   Provided counseling, but patient not interested in quitting despite active consequences of vascular disease.

## 2017-12-11 ENCOUNTER — Ambulatory Visit (HOSPITAL_COMMUNITY)
Admission: RE | Admit: 2017-12-11 | Discharge: 2017-12-11 | Disposition: A | Discharge status: Home / Self Care | Payer: Medicare Other | Source: Ambulatory Visit | Attending: Vascular Surgery | Admitting: Vascular Surgery

## 2017-12-11 ENCOUNTER — Encounter (HOSPITAL_COMMUNITY)
Admission: RE | Disposition: A | Discharge status: Home / Self Care | Payer: Self-pay | Source: Ambulatory Visit | Attending: Vascular Surgery

## 2017-12-11 DIAGNOSIS — Z95820 Peripheral vascular angioplasty status with implants and grafts: Secondary | ICD-10-CM | POA: Diagnosis not present

## 2017-12-11 DIAGNOSIS — Z79899 Other long term (current) drug therapy: Secondary | ICD-10-CM | POA: Diagnosis not present

## 2017-12-11 DIAGNOSIS — E1151 Type 2 diabetes mellitus with diabetic peripheral angiopathy without gangrene: Secondary | ICD-10-CM | POA: Insufficient documentation

## 2017-12-11 DIAGNOSIS — I70201 Unspecified atherosclerosis of native arteries of extremities, right leg: Secondary | ICD-10-CM | POA: Insufficient documentation

## 2017-12-11 DIAGNOSIS — M79604 Pain in right leg: Secondary | ICD-10-CM | POA: Diagnosis present

## 2017-12-11 DIAGNOSIS — Z7984 Long term (current) use of oral hypoglycemic drugs: Secondary | ICD-10-CM | POA: Insufficient documentation

## 2017-12-11 DIAGNOSIS — Z7982 Long term (current) use of aspirin: Secondary | ICD-10-CM | POA: Insufficient documentation

## 2017-12-11 DIAGNOSIS — I739 Peripheral vascular disease, unspecified: Secondary | ICD-10-CM

## 2017-12-11 DIAGNOSIS — E669 Obesity, unspecified: Secondary | ICD-10-CM | POA: Insufficient documentation

## 2017-12-11 DIAGNOSIS — Z7902 Long term (current) use of antithrombotics/antiplatelets: Secondary | ICD-10-CM | POA: Insufficient documentation

## 2017-12-11 DIAGNOSIS — Z791 Long term (current) use of non-steroidal anti-inflammatories (NSAID): Secondary | ICD-10-CM | POA: Insufficient documentation

## 2017-12-11 DIAGNOSIS — F17290 Nicotine dependence, other tobacco product, uncomplicated: Secondary | ICD-10-CM | POA: Diagnosis not present

## 2017-12-11 DIAGNOSIS — E785 Hyperlipidemia, unspecified: Secondary | ICD-10-CM | POA: Diagnosis not present

## 2017-12-11 HISTORY — PX: ABDOMINAL AORTOGRAM W/LOWER EXTREMITY: CATH118223

## 2017-12-11 HISTORY — PX: IR ANGIOGRAM FOLLOW UP STUDY: IMG697

## 2017-12-11 HISTORY — PX: PERIPHERAL VASCULAR INTERVENTION: CATH118257

## 2017-12-11 LAB — POCT I-STAT, CHEM 8
BUN: 11 mg/dL (ref 6–20)
Calcium, Ion: 1.16 mmol/L (ref 1.15–1.40)
Chloride: 98 mmol/L — ABNORMAL LOW (ref 101–111)
Creatinine, Ser: 0.9 mg/dL (ref 0.61–1.24)
Glucose, Bld: 116 mg/dL — ABNORMAL HIGH (ref 65–99)
HCT: 27 % — ABNORMAL LOW (ref 39.0–52.0)
Hemoglobin: 9.2 g/dL — ABNORMAL LOW (ref 13.0–17.0)
Potassium: 4.2 mmol/L (ref 3.5–5.1)
Sodium: 135 mmol/L (ref 135–145)
TCO2: 26 mmol/L (ref 22–32)

## 2017-12-11 LAB — POCT ACTIVATED CLOTTING TIME
Activated Clotting Time: 213 seconds
Activated Clotting Time: 186 seconds

## 2017-12-11 LAB — GLUCOSE, CAPILLARY
Glucose-Capillary: 119 mg/dL — ABNORMAL HIGH (ref 65–99)
Glucose-Capillary: 124 mg/dL — ABNORMAL HIGH (ref 65–99)

## 2017-12-11 SURGERY — ABDOMINAL AORTOGRAM W/LOWER EXTREMITY
Anesthesia: LOCAL | Laterality: Right

## 2017-12-11 MED ORDER — MIDAZOLAM HCL 2 MG/2ML IJ SOLN
INTRAMUSCULAR | Status: DC | PRN
  Administered 2017-12-11 (×2): 1 mg via INTRAVENOUS

## 2017-12-11 MED ORDER — CLOPIDOGREL BISULFATE 75 MG PO TABS
ORAL_TABLET | ORAL | Status: AC
  Filled 2017-12-11: qty 2

## 2017-12-11 MED ORDER — FENTANYL CITRATE (PF) 100 MCG/2ML IJ SOLN
INTRAMUSCULAR | Status: AC
  Filled 2017-12-11: qty 2

## 2017-12-11 MED ORDER — SODIUM CHLORIDE 0.9% FLUSH: 3.0000 mL | INTRAVENOUS | Status: DC | PRN

## 2017-12-11 MED ORDER — FENTANYL CITRATE (PF) 100 MCG/2ML IJ SOLN
INTRAMUSCULAR | Status: DC | PRN
  Administered 2017-12-11 (×2): 50 ug via INTRAVENOUS

## 2017-12-11 MED ORDER — HEPARIN SODIUM (PORCINE) 1000 UNIT/ML IJ SOLN
INTRAMUSCULAR | Status: AC
  Filled 2017-12-11: qty 1

## 2017-12-11 MED ORDER — LIDOCAINE HCL (PF) 1 % IJ SOLN
INTRAMUSCULAR | Status: DC | PRN
  Administered 2017-12-11: 5 mL
  Administered 2017-12-11: 15 mL

## 2017-12-11 MED ORDER — MIDAZOLAM HCL 2 MG/2ML IJ SOLN
INTRAMUSCULAR | Status: AC
  Filled 2017-12-11: qty 2

## 2017-12-11 MED ORDER — CLOPIDOGREL BISULFATE 75 MG PO TABS: 75.0000 mg | ORAL_TABLET | Freq: Every day | ORAL | 5 refills | Status: DC

## 2017-12-11 MED ORDER — SODIUM CHLORIDE 0.9 % WEIGHT BASED INFUSION: 1.0000 mL/kg/h | INTRAVENOUS | Status: DC

## 2017-12-11 MED ORDER — SODIUM CHLORIDE 0.9 % IV SOLN: 250.0000 mL | INTRAVENOUS | Status: DC | PRN

## 2017-12-11 MED ORDER — HEPARIN SODIUM (PORCINE) 1000 UNIT/ML IJ SOLN
INTRAMUSCULAR | Status: DC | PRN
  Administered 2017-12-11: 9000 [IU] via INTRAVENOUS

## 2017-12-11 MED ORDER — HYDRALAZINE HCL 20 MG/ML IJ SOLN: 5.0000 mg | INTRAMUSCULAR | Status: DC | PRN

## 2017-12-11 MED ORDER — SODIUM CHLORIDE 0.9% FLUSH: 3.0000 mL | Freq: Two times a day (BID) | INTRAVENOUS | Status: DC

## 2017-12-11 MED ORDER — CLOPIDOGREL BISULFATE 75 MG PO TABS: 75.0000 mg | ORAL_TABLET | Freq: Every day | ORAL | Status: DC

## 2017-12-11 MED ORDER — HEPARIN (PORCINE) IN NACL 2-0.9 UNIT/ML-% IJ SOLN
INTRAMUSCULAR | Status: AC
  Filled 2017-12-11: qty 1000

## 2017-12-11 MED ORDER — OXYCODONE-ACETAMINOPHEN 5-325 MG PO TABS
ORAL_TABLET | ORAL | Status: AC
  Filled 2017-12-11: qty 2

## 2017-12-11 MED ORDER — LIDOCAINE HCL (PF) 1 % IJ SOLN
INTRAMUSCULAR | Status: AC
  Filled 2017-12-11: qty 30

## 2017-12-11 MED ORDER — CLOPIDOGREL BISULFATE 300 MG PO TABS
ORAL_TABLET | ORAL | Status: DC | PRN
  Administered 2017-12-11: 150 mg via ORAL

## 2017-12-11 MED ORDER — LABETALOL HCL 5 MG/ML IV SOLN: 10.0000 mg | INTRAVENOUS | Status: DC | PRN

## 2017-12-11 MED ORDER — HEPARIN (PORCINE) IN NACL 2-0.9 UNIT/ML-% IJ SOLN
INTRAMUSCULAR | Status: AC | PRN
  Administered 2017-12-11: 1000 mL

## 2017-12-11 MED ORDER — CLOPIDOGREL BISULFATE 75 MG PO TABS: 150.0000 mg | ORAL_TABLET | Freq: Once | ORAL | Status: DC

## 2017-12-11 MED ORDER — SODIUM CHLORIDE 0.9 % IV SOLN
INTRAVENOUS | Status: DC
  Administered 2017-12-11: 08:00:00 via INTRAVENOUS

## 2017-12-11 MED ORDER — OXYCODONE-ACETAMINOPHEN 5-325 MG PO TABS
2.0000 | ORAL_TABLET | Freq: Once | ORAL | Status: AC
  Administered 2017-12-11: 2 via ORAL

## 2017-12-11 MED ORDER — IODIXANOL 320 MG/ML IV SOLN
INTRAVENOUS | Status: DC | PRN
  Administered 2017-12-11: 180 mL via INTRAVENOUS

## 2017-12-11 SURGICAL SUPPLY — 16 items
CATH ANGIO 5F PIGTAIL 65CM (CATHETERS) ×1 IMPLANT
COVER PRB 48X5XTLSCP FOLD TPE (BAG) IMPLANT
COVER PROBE 5X48 (BAG) ×3
DEVICE CONTINUOUS FLUSH (MISCELLANEOUS) ×1 IMPLANT
KIT ENCORE 26 ADVANTAGE (KITS) ×1 IMPLANT
KIT MICROINTRODUCER STIFF 5F (SHEATH) ×1 IMPLANT
KIT PV (KITS) ×3 IMPLANT
SHEATH BRITE TIP 7FR 35CM (SHEATH) ×1 IMPLANT
SHEATH PINNACLE 5F 10CM (SHEATH) ×1 IMPLANT
STENT VIABAHN 7X29X80 VBX (Permanent Stent) ×1 IMPLANT
SYR MEDRAD MARK V 150ML (SYRINGE) ×3 IMPLANT
TRANSDUCER W/STOPCOCK (MISCELLANEOUS) ×3 IMPLANT
TRAY PV CATH (CUSTOM PROCEDURE TRAY) ×3 IMPLANT
WIRE BENTSON .035X145CM (WIRE) ×1 IMPLANT
WIRE HITORQ VERSACORE ST 145CM (WIRE) ×1 IMPLANT
WIRE TORQFLEX AUST .018X40CM (WIRE) ×1 IMPLANT

## 2017-12-11 NOTE — Progress Notes (Addendum)
Site area: RFA Site Prior to Removal:  Level 0 Pressure Applied For: Manual:   yes Patient Status During Pull: stable  Post Pull Site:  Level 0 Post Pull Instructions Given:  yes Post Pull Pulses Present: palpable Dressing Applied:  tegaderm Bedrest begins @  Comments: relieved by Sherlyn Lick at 18 min mark

## 2017-12-11 NOTE — Discharge Instructions (Signed)
Angiogram, Care After °This sheet gives you information about how to care for yourself after your procedure. Your health care provider may also give you more specific instructions. If you have problems or questions, contact your health care provider. °What can I expect after the procedure? °After the procedure, it is common to have bruising and tenderness at the catheter insertion area. °Follow these instructions at home: °Insertion site care  °· Follow instructions from your health care provider about how to take care of your insertion site. Make sure you: °¨ Wash your hands with soap and water before you change your bandage (dressing). If soap and water are not available, use hand sanitizer. °¨ Change your dressing as told by your health care provider. °¨ Leave stitches (sutures), skin glue, or adhesive strips in place. These skin closures may need to stay in place for 2 weeks or longer. If adhesive strip edges start to loosen and curl up, you may trim the loose edges. Do not remove adhesive strips completely unless your health care provider tells you to do that. °· Do not take baths, swim, or use a hot tub until your health care provider approves. °· You may shower 24-48 hours after the procedure or as told by your health care provider. °¨ Gently wash the site with plain soap and water. °¨ Pat the area dry with a clean towel. °¨ Do not rub the site. This may cause bleeding. °· Do not apply powder or lotion to the site. Keep the site clean and dry. °· Check your insertion site every day for signs of infection. Check for: °¨ Redness, swelling, or pain. °¨ Fluid or blood. °¨ Warmth. °¨ Pus or a bad smell. °Activity  °· Rest as told by your health care provider, usually for 1-2 days. °· Do not lift anything that is heavier than 10 lbs. (4.5 kg) or as told by your health care provider. °· Do not drive for 24 hours if you were given a medicine to help you relax (sedative). °· Do not drive or use heavy machinery while  taking prescription pain medicine. °General instructions  °· Return to your normal activities as told by your health care provider, usually in about a week. Ask your health care provider what activities are safe for you. °· If the catheter site starts bleeding, lie flat and put pressure on the site. If the bleeding does not stop, get help right away. This is a medical emergency. °· Drink enough fluid to keep your urine clear or pale yellow. This helps flush the contrast dye from your body. °· Take over-the-counter and prescription medicines only as told by your health care provider. °· Keep all follow-up visits as told by your health care provider. This is important. °Contact a health care provider if: °· You have a fever or chills. °· You have redness, swelling, or pain around your insertion site. °· You have fluid or blood coming from your insertion site. °· The insertion site feels warm to the touch. °· You have pus or a bad smell coming from your insertion site. °· You have bruising around the insertion site. °· You notice blood collecting in the tissue around the catheter site (hematoma). The hematoma may be painful to the touch. °Get help right away if: °· You have severe pain at the catheter insertion area. °· The catheter insertion area swells very fast. °· The catheter insertion area is bleeding, and the bleeding does not stop when you hold steady pressure on   the area. °· The area near or just beyond the catheter insertion site becomes pale, cool, tingly, or numb. °These symptoms may represent a serious problem that is an emergency. Do not wait to see if the symptoms will go away. Get medical help right away. Call your local emergency services (911 in the [place] Do not drive yourself to the hospital. °Summary °· After the procedure, it is common to have bruising and tenderness at the catheter insertion area. °· After the procedure, it is important to rest and drink plenty of fluids. °· Do not take baths,  swim, or use a hot tub until your health care provider says it is okay to do so. You may shower 24-48 hours after the procedure or as told by your health care provider. °· If the catheter site starts bleeding, lie flat and put pressure on the site. If the bleeding does not stop, get help right away. This is a medical emergency. °This information is not intended to replace advice given to you by your health care provider. Make sure you discuss any questions you have with your health care provider. °Document Released: 06/30/2005 Document Revised: 11/16/2016 Document Reviewed: 11/16/2016 °Elsevier Interactive Patient Education © 2017 [employer] ° °

## 2017-12-11 NOTE — Op Note (Signed)
PATIENT: [name]   MRN: [id number] DOB: 1951/02/26    DATE OF PROCEDURE: 12/11/2017  INDICATIONS:    JAHMARI ESBENSHADE is a 66 y.o. male who presented with progressive pain in the right lower extremity.  He was set up for an arteriogram.  PROCEDURE:    1.  Ultrasound-guided access to the right common femoral artery 2.  Conscious sedation 3.  Aortogram with bilateral iliac arteriogram and bilateral lower extremity runoff 4.  Angioplasty and stenting of the right external iliac artery with a 7 mm x 29 mm VBX stent.  SURGEON: Judeth Cornfield. Scot Dock, MD, FACS  ANESTHESIA: Local with sedation  EBL: Minimal  TECHNIQUE: The patient was taken to the peripheral vascular lab was sedated. The period of conscious sedation was 64 minutes.  During that time period, I was present face-to-face 100% of the time.  The patient was administered 1 mg of Versed and 50 mcg of fentanyl. The patient's heart rate, blood pressure, and oxygen saturation were monitored by the nurse continuously during the procedure.  Both groins were prepped and draped in usual sterile fashion.  After the skin was anesthetized with 1% lidocaine, under ultrasound guidance, the right common femoral artery was over a wire.  This was exchanged for a 5 French sheath over a versa core wire.  The pigtail catheter was positioned at the L1 vertebral body and flush aortogram obtained.  Catheter was positioned above the aortic bifurcation and projection was obtained I also obtained a lateral projection.   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  I elected to address this with angioplasty and stenting.  Given the risk for embolization I elected to use a covered stent.  Change the 5 French sheath for a long 7 [place] sheath which was positioned below the stenosis.  The patient was then heparinized.  ACT was monitored throughout the case.  I then selected a 7 mm x 29 mm VBX stent which was positioned across the stenosis and  inflated to 11 atm.  Completion film showed an excellent result with no residual stenosis.  I then placed the pigtail catheter again and bilateral lower extremity runoff films were obtained.   FINDINGS:   1.  Patient has single renal arteries bilaterally with no significant renal artery stenosis identified.  The infrarenal aorta has moderate disease but no focal stenosis. 2.  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  The right common iliac artery and hypogastric artery are patent.  Below that, there is some mild disease in the common femoral artery.  The deep femoral artery is patent.  Superficial femoral artery is occluded at its origin.  There is reconstitution of the peroneal artery in the right leg only.  The anterior tibial and posterior tibial arteries are occluded. 3.  On the left, common femoral and deep femoral artery are patent.  Proximally the common iliac and external iliac artery are patent.  There is moderate disease in the left internal iliac artery.  The superficial femoral artery and popliteal arteries are occluded on the left.  The anterior tibial and posterior tibial arteries are occluded.  There is a prosthetic femoral to peroneal artery bypass graft which is widely patent.  PRE: 90% POST: 0% STENT: yes  Deitra Mayo, MD, FACS [facility]  DATE OF DICTATION:   12/11/2017

## 2017-12-11 NOTE — Progress Notes (Signed)
Relieved [name], manual pressure applied for 10 minutes to complete a 30 minute hold.  Groin level 0 tegaderm dressing applied. Bedrest instructions given. Right dp barely perceptible with doppler, but present. Right pt present with doppler.  Bedrest begins at 11:40:00

## 2017-12-12 ENCOUNTER — Telehealth: Payer: Self-pay | Admitting: Vascular Surgery

## 2017-12-12 ENCOUNTER — Encounter (HOSPITAL_COMMUNITY): Payer: Self-pay | Admitting: Vascular Surgery

## 2017-12-12 NOTE — Telephone Encounter (Signed)
-----   Message from Mena Goes, RN sent at 12/12/2017  9:49 AM EST ----- Regarding: 4-6 weeks w/ ABIs   ----- Message ----- From: Angelia Mould, MD Sent: 12/11/2017  10:21 AM To: [facility] Charge Pool Subject: charge                                          PROCEDURE:   1.  Ultrasound-guided access to the right common femoral artery 2.  Conscious sedation 3.  Aortogram with bilateral iliac arteriogram and bilateral lower extremity runoff 4.  Angioplasty and stenting of the right external iliac artery with a 7 mm x 29 mm VBX stent.  SURGEON: Judeth Cornfield. Scot Dock, MD, FACS  ANESTHESIA: Local with sedation  This patient will need a follow-up visit in 4-6 weeks with ABIs.  Thank you.

## 2017-12-12 NOTE — Telephone Encounter (Signed)
Sched appt 01/10/18; lab at 10:00 and MD at 10:45. Spoke to pt.

## 2017-12-13 ENCOUNTER — Ambulatory Visit: Payer: Medicare Other | Admitting: Family

## 2017-12-13 ENCOUNTER — Other Ambulatory Visit: Payer: Self-pay

## 2017-12-13 DIAGNOSIS — Z48812 Encounter for surgical aftercare following surgery on the circulatory system: Secondary | ICD-10-CM

## 2017-12-13 DIAGNOSIS — M79604 Pain in right leg: Secondary | ICD-10-CM

## 2017-12-13 DIAGNOSIS — I739 Peripheral vascular disease, unspecified: Secondary | ICD-10-CM

## 2018-01-02 ENCOUNTER — Other Ambulatory Visit: Payer: Self-pay | Admitting: Internal Medicine

## 2018-01-02 DIAGNOSIS — I10 Essential (primary) hypertension: Secondary | ICD-10-CM

## 2018-01-10 ENCOUNTER — Encounter: Payer: Self-pay | Admitting: Vascular Surgery

## 2018-01-10 ENCOUNTER — Ambulatory Visit (INDEPENDENT_AMBULATORY_CARE_PROVIDER_SITE_OTHER): Payer: Medicare Other | Admitting: Vascular Surgery

## 2018-01-10 ENCOUNTER — Ambulatory Visit: Payer: Medicare Other | Admitting: Vascular Surgery

## 2018-01-10 ENCOUNTER — Ambulatory Visit (HOSPITAL_COMMUNITY)
Admission: RE | Admit: 2018-01-10 | Discharge: 2018-01-10 | Disposition: A | Discharge status: Home / Self Care | Payer: Medicare Other | Source: Ambulatory Visit | Attending: Vascular Surgery | Admitting: Vascular Surgery

## 2018-01-10 VITALS — BP 125/66 | HR 69 | Temp 98.6°F | Resp 20 | Ht 67.0 in | Wt 202.0 lb

## 2018-01-10 DIAGNOSIS — R9439 Abnormal result of other cardiovascular function study: Secondary | ICD-10-CM | POA: Diagnosis not present

## 2018-01-10 DIAGNOSIS — I739 Peripheral vascular disease, unspecified: Secondary | ICD-10-CM

## 2018-01-10 DIAGNOSIS — M79604 Pain in right leg: Secondary | ICD-10-CM | POA: Diagnosis not present

## 2018-01-10 DIAGNOSIS — Z48812 Encounter for surgical aftercare following surgery on the circulatory system: Secondary | ICD-10-CM | POA: Insufficient documentation

## 2018-01-10 NOTE — Progress Notes (Signed)
Patient name: [name] MRN: [id number] DOB: 1951-05-18 Sex: male  REASON FOR VISIT:   Follow-up after angioplasty and stenting of the right external iliac artery  HPI:   [name] is a pleasant 67 y.o. male who presented with progressive pain in the right lower extremity.  He was found to have a 90% right external iliac artery stenosis which was angioplastied and stented.  I used a 7 mm x 29 mm VBX stent.  Below that, on the right, the superficial femoral artery is occluded at its origin and reconstitutes the peroneal artery in the right leg only.  He states that the symptoms in his right leg have improved significantly.  He has mild claudication of the right calf but no rest pain.  He denies any symptoms on the left side.  Specifically he denies claudication or rest pain.  On the left side he had severe infrainguinal arterial occlusive disease but has a prosthetic femoral to peroneal artery bypass which is widely patent based on his arteriogram.  He smokes cigars but not cigarettes.  There have been no other significant changes in his medical history.  Current Outpatient Medications  Medication Sig Dispense Refill  . acetaminophen (TYLENOL) 500 MG tablet Take 1,000 mg by mouth 3 (three) times daily as needed for moderate pain.    Marland Kitchen aspirin EC 81 MG tablet Take 162 mg by mouth daily.    Marland Kitchen atorvastatin (LIPITOR) 40 MG tablet Take 1 tablet (40 mg total) by mouth daily. 90 tablet 3  . benazepril (LOTENSIN) 5 MG tablet Take 1 tablet (5 mg total) by mouth daily. 90 tablet 2  . benazepril (LOTENSIN) 5 MG tablet TAKE 1 TABLET BY MOUTH EVERY DAY 30 tablet 0  . BYSTOLIC 20 MG TABS TAKE 1 TABLET BY MOUTH EVERY DAY 30 tablet 0  . clopidogrel (PLAVIX) 75 MG tablet Take 1 tablet (75 mg total) daily by mouth. 30 tablet 5  . clopidogrel (PLAVIX) 75 MG tablet Take 1 tablet (75 mg total) by mouth daily. 30 tablet 5  . clopidogrel (PLAVIX) 75 MG tablet Take 1 tablet (75 mg total) by mouth daily.  30 tablet 5  . cyclobenzaprine (FLEXERIL) 10 MG tablet TAKE ONE  1  TABLET BY MOUTH THREE TIMES DAILY AS NEEDED FOR MUSCLE SPASMS (Patient taking differently: Take 10 mg by mouth 3 (three) times daily as needed for muscle spasms. TAKE ONE  1  TABLET BY MOUTH THREE TIMES DAILY AS NEEDED FOR MUSCLE SPASMS) 30 tablet 3  . dextromethorphan-guaiFENesin (MUCINEX DM) 30-600 MG 12hr tablet Take 1 tablet by mouth 2 (two) times daily as needed for cough. 30 tablet 0  . diclofenac (VOLTAREN) 75 MG EC tablet Take 1 tablet (75 mg total) by mouth 2 (two) times daily. 30 tablet 0  . docusate sodium (COLACE) 100 MG capsule Take 1 capsule (100 mg total) by mouth 2 (two) times daily. 60 capsule 2  . ezetimibe (ZETIA) 10 MG tablet Take 1 tablet (10 mg total) by mouth daily. 90 tablet 2  . FLUoxetine (PROZAC) 10 MG capsule Take 1 capsule (10 mg total) by mouth daily. 90 capsule 2  . FLUoxetine (PROZAC) 10 MG capsule TAKE 1 CAPSULE BY MOUTH EVERY DAY 30 capsule 0  . gabapentin (NEURONTIN) 800 MG tablet TAKE 1.5 TABLETS (1,200 MG TOTAL) BY MOUTH 3 (THREE) TIMES DAILY. 135 tablet 11  . metFORMIN (GLUCOPHAGE) 1000 MG tablet Take 1 tablet (1,000 mg total) by mouth 2 (two) times daily with  a meal. 180 tablet 2  . metFORMIN (GLUCOPHAGE) 1000 MG tablet TAKE 1 TABLET (1,000 MG TOTAL) BY MOUTH 2 (TWO) TIMES DAILY WITH A MEAL. 60 tablet 0  . Nebivolol HCl (BYSTOLIC) 20 MG TABS Take 1 tablet (20 mg total) by mouth daily. 90 tablet 2  . pantoprazole (PROTONIX) 40 MG tablet Take 1 tablet (40 mg total) by mouth daily. 90 tablet 2  . pantoprazole (PROTONIX) 40 MG tablet TAKE 1 TABLET BY MOUTH EVERY DAY 30 tablet 0  . Phenylephrine-DM-GG (MUCINEX FAST-MAX CONGEST COUGH) 2.5-5-100 MG/5ML LIQD Take 30 mLs by mouth every 12 (twelve) hours as needed (cough).    . traMADol (ULTRAM) 50 MG tablet Take 1 tablet (50 mg total) by mouth every 12 (twelve) hours as needed for severe pain. 30 tablet 1   No current facility-administered medications  for this visit.     REVIEW OF SYSTEMS:  [X]  denotes positive finding, [ ]  denotes negative finding Cardiac  Comments:  Chest pain or chest pressure:    Shortness of breath upon exertion:    Short of breath when lying flat:    Irregular heart rhythm:    Constitutional    Fever or chills:     PHYSICAL EXAM:   Vitals:   01/10/18 1042  BP: 125/66  Pulse: 69  Resp: 20  Temp: 98.6 F (37 C)  TempSrc: Oral  SpO2: 99%  Weight: 202 lb (91.6 kg)  Height: 5\' 7"  (1.702 m)    GENERAL: The patient is a well-nourished male, in no acute distress. The vital signs are documented above. CARDIOVASCULAR: There is a regular rate and rhythm. PULMONARY: There is good air exchange bilaterally without wheezing or rales. He has palpable femoral pulses bilaterally. Both feet are warm and well perfused. He has no significant lower extremity swelling.  DATA:   ARTERIAL DOPPLER STUDY: I have independently interpreted his arterial Doppler study today.  This shows monophasic Doppler signals in the right foot with an ABI of 53%.  On the left he has a triphasic peroneal signal and a biphasic posterior tibial and dorsalis pedis signal with an ABI of 100%.    MEDICAL ISSUES:   PERIPHERAL VASCULAR DISEASE: He is undergone successful angioplasty and stenting of his right external iliac artery stenosis.  The symptoms in the right leg have improved.  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  I have encouraged him to stay as active as possible.  In addition he has a prosthetic femoral to peroneal artery bypass graft which is still patent.  I plan on seeing him back in 6 months with a duplex of his right external iliac artery stent, his left femoral to peroneal artery bypass, and ABIs.  He knows to call sooner if he has problems.  Deitra Mayo [facility]  [facility] [phone]

## 2018-01-16 ENCOUNTER — Other Ambulatory Visit: Payer: Self-pay

## 2018-01-16 DIAGNOSIS — M79604 Pain in right leg: Secondary | ICD-10-CM

## 2018-01-16 DIAGNOSIS — I739 Peripheral vascular disease, unspecified: Secondary | ICD-10-CM

## 2018-01-31 ENCOUNTER — Other Ambulatory Visit: Payer: Self-pay | Admitting: Family Medicine

## 2018-01-31 ENCOUNTER — Other Ambulatory Visit: Payer: Self-pay | Admitting: Internal Medicine

## 2018-02-06 ENCOUNTER — Other Ambulatory Visit: Payer: Self-pay

## 2018-02-06 ENCOUNTER — Encounter: Payer: Self-pay | Admitting: Cardiology

## 2018-02-06 MED ORDER — ATORVASTATIN CALCIUM 40 MG PO TABS: 40.0000 mg | ORAL_TABLET | Freq: Every day | ORAL | 1 refills | Status: DC

## 2018-02-10 ENCOUNTER — Other Ambulatory Visit: Payer: Self-pay | Admitting: Internal Medicine

## 2018-03-26 ENCOUNTER — Encounter: Payer: Self-pay | Admitting: Internal Medicine

## 2018-03-26 ENCOUNTER — Ambulatory Visit (INDEPENDENT_AMBULATORY_CARE_PROVIDER_SITE_OTHER): Payer: Medicare Other | Admitting: Internal Medicine

## 2018-03-26 ENCOUNTER — Other Ambulatory Visit: Payer: Self-pay

## 2018-03-26 VITALS — BP 98/68 | HR 71 | Temp 98.0°F | Ht 67.0 in | Wt 206.8 lb

## 2018-03-26 DIAGNOSIS — Z79899 Other long term (current) drug therapy: Secondary | ICD-10-CM | POA: Diagnosis not present

## 2018-03-26 DIAGNOSIS — M79605 Pain in left leg: Secondary | ICD-10-CM | POA: Diagnosis not present

## 2018-03-26 DIAGNOSIS — Z5181 Encounter for therapeutic drug level monitoring: Secondary | ICD-10-CM

## 2018-03-26 DIAGNOSIS — M79604 Pain in right leg: Secondary | ICD-10-CM

## 2018-03-26 DIAGNOSIS — E119 Type 2 diabetes mellitus without complications: Secondary | ICD-10-CM

## 2018-03-26 DIAGNOSIS — R252 Cramp and spasm: Secondary | ICD-10-CM | POA: Diagnosis not present

## 2018-03-26 DIAGNOSIS — D649 Anemia, unspecified: Secondary | ICD-10-CM

## 2018-03-26 LAB — POCT GLYCOSYLATED HEMOGLOBIN (HGB A1C): Hemoglobin A1C: 6

## 2018-03-26 NOTE — Patient Instructions (Signed)
Mr. Symeon,  Please try amitriptyline 25 mg at bedtime for nerve pain. See me back in about 1 month to check symptoms.  I will call with lab results.  Best, Dr. Ola Spurr

## 2018-03-26 NOTE — Progress Notes (Signed)
[facility] Progress Note  Subjective:  [name] is a 67 y.o. male with history of type 2 diabetes, tobacco abuse, severe PVD status post bypass of left leg and recent stenting of right leg in December who presents for right leg pain.  He reports improvement in overall pain since stenting of external iliac artery but he continues to have right calf pain with activity and burning type twinges from lateral aspect of his right knee down towards the top of his foot when at rest. He says his vascular doctor recommended he be evaluated for diabetic neuropathy.  He also reports having difficulty sleeping at night due to discomfort.  He says the predominant pain is the burning type of pain.  He has been able to get around much more easily after his vascular procedure in December.  His feet hurt most when wearing socks and shoes. He does note running out of gabapentin for the last week with some worsening of burning type pain.  He denies swelling of his lower extremities.  ROS: No fevers, rashes, back pain  Allergies  Allergen Reactions  . Glipizide Other (See Comments)    REACTION IS SIDE EFFECT Severe hypoglycemia to 40s.     Social History   Tobacco Use  . Smoking status: Current Some Day Smoker    Years: 24.00    Types: Cigars  . Smokeless tobacco: Never Used  . Tobacco comment: smokes 1-2 cigars per day   Substance Use Topics  . Alcohol use: Yes    Alcohol/week: 1.2 oz    Types: 2 Cans of beer per week    Objective: Blood pressure 98/68, pulse 71, temperature 98 F (36.7 C), temperature source Oral, height 5\' 7"  (1.702 m), weight 206 lb 12.8 oz (93.8 kg), SpO2 97 %. Body mass index is 32.39 kg/m. Constitutional: Pleasant male in NAD CV: Palpable DP pulses bilaterally. Feet warm.  Musculoskeletal: Lower legs appear symmetric. Mild TTP over entire right posterior lower leg and over dorsum of right foot. No LE edema. No midline spinal TTP.  Neurological: Monofilament  testing normal bilaterally. Proprioception of feet intact. Patellar reflexes 1+ bilaterally. 5/5 dorsi and plantar flexion bilaterally. Negative straight leg raise bilaterally. Skin: Hyperpigmentation across anterior tibias bilaterally.  Psychiatric: Normal mood and affect.  Vitals reviewed  A1c 6.0 today. Improved from 6.7 last December.  R LE doppler performed 11/2017: Negative for DVT. Did show cluster of varicose veins over R calf.   Assessment/Plan: Pain in both lower extremities - Right > left leg burning pain. Sounds at least partially neuropathic in nature but also with ongoing claudication symptoms, though much improved since stenting in December. Unlikely DVT with recent negative LE doppler and calf pain remaining stable. Do not think symptoms are from pinched nerve in back given negative straight leg raise and distribution of lower legs only. - Suggested trying amitriptyline 25 mg QHS in place of gabapentin at this time. - Could try topical capsaicin. - Will check labs that could be causing leg pain (B12--on metformin, vitamin D, CBC with ferritin)  Follow-up in about 1 month to reassess pain.  Olene Floss, MD [place], PGY-3

## 2018-03-27 ENCOUNTER — Other Ambulatory Visit: Payer: Self-pay

## 2018-03-27 ENCOUNTER — Encounter: Payer: Self-pay | Admitting: Internal Medicine

## 2018-03-27 ENCOUNTER — Telehealth: Payer: Self-pay

## 2018-03-27 ENCOUNTER — Encounter (HOSPITAL_COMMUNITY): Payer: Self-pay

## 2018-03-27 ENCOUNTER — Inpatient Hospital Stay (HOSPITAL_COMMUNITY)
Admission: EM | Admit: 2018-03-27 | Discharge: 2018-03-30 | DRG: 812 | Disposition: A | Discharge status: Home / Self Care | Payer: Medicare Other | Attending: Family Medicine | Admitting: Family Medicine

## 2018-03-27 ENCOUNTER — Telehealth: Payer: Self-pay | Admitting: Internal Medicine

## 2018-03-27 DIAGNOSIS — E1140 Type 2 diabetes mellitus with diabetic neuropathy, unspecified: Secondary | ICD-10-CM | POA: Diagnosis not present

## 2018-03-27 DIAGNOSIS — Z7982 Long term (current) use of aspirin: Secondary | ICD-10-CM

## 2018-03-27 DIAGNOSIS — G8929 Other chronic pain: Secondary | ICD-10-CM | POA: Diagnosis present

## 2018-03-27 DIAGNOSIS — K5900 Constipation, unspecified: Secondary | ICD-10-CM | POA: Diagnosis not present

## 2018-03-27 DIAGNOSIS — Z8349 Family history of other endocrine, nutritional and metabolic diseases: Secondary | ICD-10-CM | POA: Diagnosis not present

## 2018-03-27 DIAGNOSIS — K219 Gastro-esophageal reflux disease without esophagitis: Secondary | ICD-10-CM | POA: Diagnosis present

## 2018-03-27 DIAGNOSIS — Z7902 Long term (current) use of antithrombotics/antiplatelets: Secondary | ICD-10-CM

## 2018-03-27 DIAGNOSIS — D649 Anemia, unspecified: Secondary | ICD-10-CM | POA: Diagnosis not present

## 2018-03-27 DIAGNOSIS — E785 Hyperlipidemia, unspecified: Secondary | ICD-10-CM | POA: Diagnosis present

## 2018-03-27 DIAGNOSIS — D509 Iron deficiency anemia, unspecified: Principal | ICD-10-CM | POA: Diagnosis present

## 2018-03-27 DIAGNOSIS — E1151 Type 2 diabetes mellitus with diabetic peripheral angiopathy without gangrene: Secondary | ICD-10-CM | POA: Diagnosis present

## 2018-03-27 DIAGNOSIS — Z7984 Long term (current) use of oral hypoglycemic drugs: Secondary | ICD-10-CM

## 2018-03-27 DIAGNOSIS — M545 Low back pain: Secondary | ICD-10-CM | POA: Diagnosis present

## 2018-03-27 DIAGNOSIS — Z888 Allergy status to other drugs, medicaments and biological substances status: Secondary | ICD-10-CM

## 2018-03-27 DIAGNOSIS — I10 Essential (primary) hypertension: Secondary | ICD-10-CM | POA: Diagnosis present

## 2018-03-27 DIAGNOSIS — E669 Obesity, unspecified: Secondary | ICD-10-CM | POA: Diagnosis present

## 2018-03-27 DIAGNOSIS — Z833 Family history of diabetes mellitus: Secondary | ICD-10-CM | POA: Diagnosis not present

## 2018-03-27 DIAGNOSIS — E1142 Type 2 diabetes mellitus with diabetic polyneuropathy: Secondary | ICD-10-CM | POA: Diagnosis present

## 2018-03-27 DIAGNOSIS — K5520 Angiodysplasia of colon without hemorrhage: Secondary | ICD-10-CM | POA: Diagnosis present

## 2018-03-27 DIAGNOSIS — Z6831 Body mass index (BMI) 31.0-31.9, adult: Secondary | ICD-10-CM | POA: Diagnosis not present

## 2018-03-27 DIAGNOSIS — M79604 Pain in right leg: Secondary | ICD-10-CM | POA: Insufficient documentation

## 2018-03-27 DIAGNOSIS — Z8249 Family history of ischemic heart disease and other diseases of the circulatory system: Secondary | ICD-10-CM

## 2018-03-27 DIAGNOSIS — Z8049 Family history of malignant neoplasm of other genital organs: Secondary | ICD-10-CM | POA: Diagnosis not present

## 2018-03-27 DIAGNOSIS — Z95820 Peripheral vascular angioplasty status with implants and grafts: Secondary | ICD-10-CM | POA: Diagnosis not present

## 2018-03-27 DIAGNOSIS — F17290 Nicotine dependence, other tobacco product, uncomplicated: Secondary | ICD-10-CM | POA: Diagnosis present

## 2018-03-27 DIAGNOSIS — K921 Melena: Secondary | ICD-10-CM | POA: Diagnosis not present

## 2018-03-27 DIAGNOSIS — M159 Polyosteoarthritis, unspecified: Secondary | ICD-10-CM | POA: Diagnosis present

## 2018-03-27 DIAGNOSIS — R42 Dizziness and giddiness: Secondary | ICD-10-CM | POA: Diagnosis not present

## 2018-03-27 DIAGNOSIS — Z800 Family history of malignant neoplasm of digestive organs: Secondary | ICD-10-CM

## 2018-03-27 DIAGNOSIS — Z981 Arthrodesis status: Secondary | ICD-10-CM | POA: Diagnosis not present

## 2018-03-27 DIAGNOSIS — I739 Peripheral vascular disease, unspecified: Secondary | ICD-10-CM | POA: Diagnosis not present

## 2018-03-27 DIAGNOSIS — I2510 Atherosclerotic heart disease of native coronary artery without angina pectoris: Secondary | ICD-10-CM | POA: Diagnosis present

## 2018-03-27 DIAGNOSIS — Q2733 Arteriovenous malformation of digestive system vessel: Secondary | ICD-10-CM | POA: Diagnosis not present

## 2018-03-27 DIAGNOSIS — M79605 Pain in left leg: Secondary | ICD-10-CM | POA: Insufficient documentation

## 2018-03-27 LAB — CBC WITH DIFFERENTIAL
Basophils Absolute: 0 10*3/uL (ref 0.0–0.2)
Basos: 1 %
EOS (ABSOLUTE): 0.2 10*3/uL (ref 0.0–0.4)
Eos: 4 %
Hematocrit: 21.8 % — ABNORMAL LOW (ref 37.5–51.0)
Hemoglobin: 5.7 g/dL — CL (ref 13.0–17.7)
Immature Grans (Abs): 0 10*3/uL (ref 0.0–0.1)
Immature Granulocytes: 0 %
Lymphocytes Absolute: 2.4 10*3/uL (ref 0.7–3.1)
Lymphs: 40 %
MCH: 15.2 pg — ABNORMAL LOW (ref 26.6–33.0)
MCHC: 26.1 g/dL — ABNORMAL LOW (ref 31.5–35.7)
MCV: 58 fL — ABNORMAL LOW (ref 79–97)
Monocytes Absolute: 0.6 10*3/uL (ref 0.1–0.9)
Monocytes: 9 %
Neutrophils Absolute: 2.8 10*3/uL (ref 1.4–7.0)
Neutrophils: 46 %
RBC: 3.75 x10E6/uL — ABNORMAL LOW (ref 4.14–5.80)
RDW: 23.4 % — ABNORMAL HIGH (ref 12.3–15.4)
WBC: 6.1 10*3/uL (ref 3.4–10.8)

## 2018-03-27 LAB — CBC WITH DIFFERENTIAL/PLATELET
Basophils Absolute: 0 10*3/uL (ref 0.0–0.1)
Basophils Relative: 0 %
Eosinophils Absolute: 0.2 10*3/uL (ref 0.0–0.7)
Eosinophils Relative: 3 %
HCT: 22.4 % — ABNORMAL LOW (ref 39.0–52.0)
Hemoglobin: 5.5 g/dL — CL (ref 13.0–17.0)
Lymphocytes Relative: 31 %
Lymphs Abs: 1.6 10*3/uL (ref 0.7–4.0)
MCH: 14.2 pg — ABNORMAL LOW (ref 26.0–34.0)
MCHC: 24.6 g/dL — ABNORMAL LOW (ref 30.0–36.0)
MCV: 58 fL — ABNORMAL LOW (ref 78.0–100.0)
Monocytes Absolute: 0.4 10*3/uL (ref 0.1–1.0)
Monocytes Relative: 8 %
Neutro Abs: 3.1 10*3/uL (ref 1.7–7.7)
Neutrophils Relative %: 58 %
Platelets: 240 10*3/uL (ref 150–400)
RBC: 3.86 MIL/uL — ABNORMAL LOW (ref 4.22–5.81)
RDW: 23.6 % — ABNORMAL HIGH (ref 11.5–15.5)
WBC: 5.3 10*3/uL (ref 4.0–10.5)

## 2018-03-27 LAB — VITAMIN B12: Vitamin B-12: 303 pg/mL (ref 232–1245)

## 2018-03-27 LAB — VITAMIN D 25 HYDROXY (VIT D DEFICIENCY, FRACTURES): Vit D, 25-Hydroxy: 16.4 ng/mL — ABNORMAL LOW (ref 30.0–100.0)

## 2018-03-27 LAB — CBC
HCT: 21.3 % — ABNORMAL LOW (ref 39.0–52.0)
Hemoglobin: 5.4 g/dL — CL (ref 13.0–17.0)
MCH: 14.7 pg — ABNORMAL LOW (ref 26.0–34.0)
MCHC: 25.4 g/dL — ABNORMAL LOW (ref 30.0–36.0)
MCV: 57.9 fL — ABNORMAL LOW (ref 78.0–100.0)
Platelets: 208 10*3/uL (ref 150–400)
RBC: 3.68 MIL/uL — ABNORMAL LOW (ref 4.22–5.81)
RDW: 23.7 % — ABNORMAL HIGH (ref 11.5–15.5)
WBC: 4.8 10*3/uL (ref 4.0–10.5)

## 2018-03-27 LAB — BASIC METABOLIC PANEL
Anion gap: 11 (ref 5–15)
BUN: 6 mg/dL (ref 6–20)
CO2: 23 mmol/L (ref 22–32)
Calcium: 8.8 mg/dL — ABNORMAL LOW (ref 8.9–10.3)
Chloride: 100 mmol/L — ABNORMAL LOW (ref 101–111)
Creatinine, Ser: 1.01 mg/dL (ref 0.61–1.24)
GFR calc Af Amer: >60 mL/min (ref >60)
GFR calc non Af Amer: >60 mL/min (ref >60)
Glucose, Bld: 99 mg/dL (ref 65–99)
Potassium: 3.9 mmol/L (ref 3.5–5.1)
Sodium: 134 mmol/L — ABNORMAL LOW (ref 135–145)

## 2018-03-27 LAB — IRON AND TIBC
Iron: 13 ug/dL — ABNORMAL LOW (ref 45–182)
Saturation Ratios: 2 % — ABNORMAL LOW (ref 17.9–39.5)
TIBC: 554 ug/dL — ABNORMAL HIGH (ref 250–450)
UIBC: 541 ug/dL

## 2018-03-27 LAB — FOLATE: Folate: 10.8 ng/mL (ref >5.9)

## 2018-03-27 LAB — FERRITIN: Ferritin: 5 ng/mL — ABNORMAL LOW (ref 30–400)

## 2018-03-27 LAB — RETICULOCYTES
RBC.: 3.86 MIL/uL — ABNORMAL LOW (ref 4.22–5.81)
Retic Count, Absolute: 73.3 10*3/uL (ref 19.0–186.0)
Retic Ct Pct: 1.9 % (ref 0.4–3.1)

## 2018-03-27 LAB — PREPARE RBC (CROSSMATCH)

## 2018-03-27 MED ORDER — BENAZEPRIL HCL 5 MG PO TABS
5.0000 mg | ORAL_TABLET | Freq: Every day | ORAL | Status: DC
  Administered 2018-03-27 – 2018-03-30 (×4): 5 mg via ORAL
  Filled 2018-03-27 (×4): qty 1

## 2018-03-27 MED ORDER — GABAPENTIN 600 MG PO TABS: 1200.0000 mg | ORAL_TABLET | Freq: Three times a day (TID) | ORAL | Status: DC | PRN

## 2018-03-27 MED ORDER — FLUOXETINE HCL 10 MG PO CAPS
10.0000 mg | ORAL_CAPSULE | Freq: Every day | ORAL | Status: DC
  Administered 2018-03-27 – 2018-03-30 (×4): 10 mg via ORAL
  Filled 2018-03-27 (×4): qty 1

## 2018-03-27 MED ORDER — PANTOPRAZOLE SODIUM 40 MG PO TBEC
40.0000 mg | DELAYED_RELEASE_TABLET | Freq: Every day | ORAL | Status: DC
  Administered 2018-03-27: 40 mg via ORAL
  Filled 2018-03-27: qty 1

## 2018-03-27 MED ORDER — NEBIVOLOL HCL 10 MG PO TABS
20.0000 mg | ORAL_TABLET | Freq: Every day | ORAL | Status: DC
  Administered 2018-03-27 – 2018-03-30 (×4): 20 mg via ORAL
  Filled 2018-03-27 (×4): qty 2

## 2018-03-27 MED ORDER — METFORMIN HCL 500 MG PO TABS
1000.0000 mg | ORAL_TABLET | Freq: Two times a day (BID) | ORAL | Status: DC
  Administered 2018-03-27 – 2018-03-30 (×5): 1000 mg via ORAL
  Filled 2018-03-27 (×6): qty 2

## 2018-03-27 MED ORDER — CYCLOBENZAPRINE HCL 10 MG PO TABS: 10.0000 mg | ORAL_TABLET | Freq: Three times a day (TID) | ORAL | Status: DC | PRN

## 2018-03-27 MED ORDER — ACETAMINOPHEN 500 MG PO TABS
1000.0000 mg | ORAL_TABLET | Freq: Three times a day (TID) | ORAL | Status: DC | PRN
  Administered 2018-03-27 – 2018-03-30 (×5): 1000 mg via ORAL
  Filled 2018-03-27 (×5): qty 2

## 2018-03-27 MED ORDER — EZETIMIBE 10 MG PO TABS
10.0000 mg | ORAL_TABLET | Freq: Every day | ORAL | Status: DC
  Administered 2018-03-27 – 2018-03-30 (×4): 10 mg via ORAL
  Filled 2018-03-27 (×4): qty 1

## 2018-03-27 MED ORDER — SODIUM CHLORIDE 0.9 % IV SOLN: Freq: Once | INTRAVENOUS | Status: DC

## 2018-03-27 MED ORDER — GABAPENTIN 600 MG PO TABS: 1200.0000 mg | ORAL_TABLET | Freq: Three times a day (TID) | ORAL | Status: DC

## 2018-03-27 MED ORDER — POLYETHYLENE GLYCOL 3350 17 G PO PACK
17.0000 g | PACK | Freq: Every day | ORAL | Status: DC
  Administered 2018-03-27 – 2018-03-30 (×3): 17 g via ORAL
  Filled 2018-03-27 (×4): qty 1

## 2018-03-27 MED ORDER — ATORVASTATIN CALCIUM 40 MG PO TABS
40.0000 mg | ORAL_TABLET | Freq: Every day | ORAL | Status: DC
  Administered 2018-03-27 – 2018-03-30 (×4): 40 mg via ORAL
  Filled 2018-03-27 (×4): qty 1

## 2018-03-27 NOTE — Progress Notes (Signed)
Received report on pt.

## 2018-03-27 NOTE — H&P (Addendum)
[facility] Admission History and Physical Service Pager: [phone]  Patient name: [name] Medical record number: [id number] Date of birth: [birth year] Age: 67 y.o. Gender: male  Primary Care Provider: Rogue Bussing, MD Consultants: none Code Status: full  Chief Complaint: symptomatic anemia  Assessment and Plan: [name] is a 67 y.o. male presenting with symptomatic anemia after Hgb found to be 5.7 in clinic. PMH is significant for obesity, well-controlled T2DM, HTN, HLD, PAD, diabetic neuropathyiron deficiency, tobacco use.   Severe iron deficiency anemia: uncertain etiology. Microcytic anemia has been noted multiple times in the past, and he was hospitalized in Sept 2016 for asymptomatic anemia without a cause identified at that time, although it was characterized as Fe deficiency anemia.  Colonoscopy 2013 with Dr. Collene Mares showed 4 tubular adenomas, 5 year follow up surveillance recommended; however, patient has yet gotten his repeat colonoscopy.  At risk for colon cancer due to adenomatous polyps in 2013 and mother with hx of colon cancer.  No known source of bleeding per patient.  Ferritin was significantly reduced at 5 on 03/26/18, which points to Fe deficiency anemia.  Vitamin B12 level was 300 on 03/26/18.  Most likely cause of anemia would be GI loss, but differential also includes thalassemia, RBC lysis, microscopic hematuria, nutritional, malignancy, or other marrow deficiency.  Will obtain further anemia studies to help elucidate the cause of his anemia. - admit to [facility], telemetry, attending Dr. Mingo Amber - Transfuse 2u PRBC and recheck CBC (transfusion threshold is 8) >>hold 2 units ahead - anemia panel, haptoglobin - CBC w/ diff in am - FOBT - hold NSAIDs, ASA, plavix, no pharmacologic VTE prophylaxis  Peripheral arterial disease: Not classical claudication symptoms, but leg pain could be partly due to PAD worsened by anemia.  His legs  are warm bilaterally with strong distal pulses. - continue home lipitor 40 mg daily, nebivolol 20 mg daily  Peripheral neuropathy: Bilateral, chronic, likely combination of ischemic and diabetic neuropathy.  - Continue home gabapentin and tylenol  T2DM: Hgb A1c 6.0%, managed with metformin only - Continue home ACE - benazepril 5 mg daily - Continue gabapentin for neuropathy 1200 mg TID PRN - continue metformin since patient's creatinine is wnl - 1000 mg BID - daily CBGs  HTN: Continue beta blocker and ACE as above.   Tobacco use: Daily cigar use.  Will discuss cessation during admission.  - nicotine patch on request  GERD: On chronic protonix. - continue protonix for now - consider titrating down as outpatient - will add to d/c summary  FEN/GI: protonix  Prophylaxis: SCDs given risk of bleeding  Disposition: admit to telemetry, attending Dr. Mingo Amber  History of Present Illness:  [name] is a 67 y.o. male presenting with symptomatic anemia.  He complains of feeling dizzy and sleepy over the past 2-3 days.  His dizziness feels worse when he is more active and he describes it has feeling like he might fall if he didn't sit down.  He also reports shortness of breath for the last few weeks exacerbated with activity.  Denies bloody or black stools.  No unexplained bruising.  No trauma.  He does have a history of anemia in the past, but does not know why he has had anemia in the past.  He says that he got a colonoscopy in 2013 that had polyps that were removed and that he is due for his next one. Denies chest pain or palpitations. Denies abdominal pain.  Review Of Systems: Per HPI with the following additions:   Review of Systems  Constitutional: Positive for malaise/fatigue. Negative for chills, fever and weight loss.  HENT: Negative for congestion and nosebleeds.   Eyes: Positive for blurred vision.  Respiratory: Positive for shortness of breath. Negative for cough and  wheezing.   Cardiovascular: Negative for chest pain, palpitations and orthopnea.  Gastrointestinal: Negative for abdominal pain, blood in stool, constipation, diarrhea, melena, nausea and vomiting.  Genitourinary: Negative for dysuria and hematuria.  Musculoskeletal: Positive for neck pain.  Skin: Negative for rash.  Neurological: Positive for dizziness and weakness. Negative for focal weakness and headaches.  Endo/Heme/Allergies: Does not bruise/bleed easily.  Psychiatric/Behavioral: Negative for depression. The patient is not nervous/anxious.     Patient Active Problem List   Diagnosis Date Noted  . Pain in both lower extremities 03/27/2018  . Symptomatic anemia 03/27/2018  . Pain of right lower extremity 12/10/2017  . Radiculopathy, cervical region 02/11/2017  . Depression 10/03/2016  . Back pain 06/24/2016  . Vision changes 02/02/2016  . Claudication of both lower extremities ([place]) 07/15/2015  . Proteinuria 08/27/2014  . Diabetic neuropathy ([place]) 02/27/2014  . Iron deficiency anemia 08/01/2012  . Type 2 diabetes mellitus without complication ([place]) 63/12/6008  . GERD 01/14/2009  . HYPERCHOLESTEROLEMIA 02/22/2007  . OBESITY, NOS 02/22/2007  . Tobacco abuse 02/22/2007  . HYPERTENSION, BENIGN SYSTEMIC 02/22/2007  . PAD (peripheral artery disease) ([place]) 02/22/2007  . OSTEOARTHRITIS, MULTI SITES 02/22/2007    Past Medical History: Past Medical History:  Diagnosis Date  . Anemia   . Arthritis    OA  . Cervical radiculopathy    Dr. Vertell Limber neurosurgery  . Chronic lower back pain   . Diabetes mellitus    takes Metformin daily  . GERD (gastroesophageal reflux disease)    takes Protonix daily  . History of blood transfusion    "related to low HgB" ((09/10/2015  . Hyperlipidemia    takes Vytorin daily  . Hypertension    takes Benazepril and Bystolic daily  . PAD (peripheral artery disease) ([place])   . Pneumonia   . Shortness of breath dyspnea    with exertion  . Tobacco  user   . Toe fracture, right 05/09/2011    Past Surgical History: Past Surgical History:  Procedure Laterality Date  . ABDOMINAL AORTOGRAM W/LOWER EXTREMITY N/A 12/11/2017   Procedure: ABDOMINAL AORTOGRAM W/LOWER EXTREMITY;  Surgeon: Angelia Mould, MD;  Location: [facility];  Service: Cardiovascular;  Laterality: N/A;  . ANTERIOR CERVICAL DECOMP/DISCECTOMY FUSION  03/08/12   C6-7  . ANTERIOR CERVICAL DECOMP/DISCECTOMY FUSION  03/08/2012   Procedure: ANTERIOR CERVICAL DECOMPRESSION/DISCECTOMY FUSION 1 LEVEL/HARDWARE REMOVAL;  Surgeon: Erline Levine, MD;  Location: [facility];  Service: Neurosurgery;  Laterality: N/A;  revison of C5-7 anterior cervical decompression with fusion with Cervical Five-Thoracic One anterior cervical decompression with fusion with interbody prothesis plating and bonegraft  . BACK SURGERY  1996  . BYPASS GRAFT FEMORAL-PERONEAL Left 11/11/2016   Procedure: REDO LEFT FEMORAL-PERONEAL BYPASS WITH PROPATEN 6MM X 80CM GRAFT;  Surgeon: Angelia Mould, MD;  Location: [facility] OR;  Service: Vascular;  Laterality: Left;  . COLONOSCOPY W/ BIOPSIES AND POLYPECTOMY  08/17/2012   f/u 5 years, 4 polyps, no high grade dysplasia or malignancy, tubular adenoma, hyperplastic polyops  . ENTEROSCOPY N/A 12/11/2015   Procedure: ENTEROSCOPY;  Surgeon: Carol Ada, MD;  Location: [facility] END[facility];  Service: Endoscopy;  Laterality: N/A;  . ESOPHAGOGASTRODUODENOSCOPY  08/17/2012   normal esophagus and  GEJ, diffuse gastritis with erythema- no malignancy, reactive gastropathy  with focal intestinal metaplasia  . FEMORAL-POPLITEAL BYPASS GRAFT Left 01/06/2016   Procedure: Left  COMMON FEMORAL-BELOW KNEE POPLITEAL ARTERY Bypass using non-reversed translocated saphenous vein graft from left leg;  Surgeon: Mal Misty, MD;  Location: [facility];  Service: Vascular;  Laterality: Left;  . GIVENS CAPSULE STUDY N/A 11/24/2015   Procedure: GIVENS CAPSULE STUDY;  Surgeon: Juanita Craver, MD;   Location: [facility];  Service: Endoscopy;  Laterality: N/A;  . INGUINAL HERNIA REPAIR  1990's   right  . INTRAOPERATIVE ARTERIOGRAM Left 01/06/2016   Procedure: INTRA OPERATIVE ARTERIOGRAM LEFT LOWER LEG;  Surgeon: Mal Misty, MD;  Location: [facility];  Service: Vascular;  Laterality: Left;  . INTRAOPERATIVE ARTERIOGRAM Left 11/11/2016   Procedure: INTRA OPERATIVE ARTERIOGRAM LEFT LOWER EXTRIMITY;  Surgeon: Angelia Mould, MD;  Location: [facility];  Service: Vascular;  Laterality: Left;  . IR ANGIOGRAM FOLLOW UP STUDY  12/11/2017  . IR GENERIC HISTORICAL  10/24/2016   IR ANGIOGRAM FOLLOW UP STUDY  . LOWER EXTREMITY ANGIOGRAM Bilateral 07/30/2015   Procedure: Lower Extremity Angiogram;  Surgeon: Conrad Homeacre-Lyndora, MD;  Location: [facility];  Service: Cardiovascular;  Laterality: Bilateral;  . [place]   "lower"  . PERIPHERAL VASCULAR CATHETERIZATION N/A 07/30/2015   Procedure: Abdominal Aortogram;  Surgeon: Conrad Mount Repose, MD;  Location: [facility];  Service: Cardiovascular;  Laterality: N/A;  . PERIPHERAL VASCULAR CATHETERIZATION N/A 10/24/2016   Procedure: Abdominal Aortogram;  Surgeon: Angelia Mould, MD;  Location: [facility] [facility];  Service: Cardiovascular;  Laterality: N/A;  . PERIPHERAL VASCULAR CATHETERIZATION N/A 10/24/2016   Procedure: Lower Extremity Angiography;  Surgeon: Angelia Mould, MD;  Location: [facility];  Service: Cardiovascular;  Laterality: N/A;  . PERIPHERAL VASCULAR INTERVENTION Right 12/11/2017   Procedure: PERIPHERAL VASCULAR INTERVENTION;  Surgeon: Angelia Mould, MD;  Location: [facility];  Service: Cardiovascular;  Laterality: Right;  Marland Kitchen VASCULAR SURGERY  ~ 2007   Stent SFA   . VEIN HARVEST Left 01/06/2016   Procedure: LEFT GREATER SAPPHENOUS VEIN HARVEST;  Surgeon: Mal Misty, MD;  Location[name];  Service: Vascular;  Laterality: Left;    Social History: Social History   Tobacco Use  .  Smoking status: Current Some Day Smoker    Years: 24.00    Types: Cigars  . Smokeless tobacco: Never Used  . Tobacco comment: smokes 1-2 cigars per day   Substance Use Topics  . Alcohol use: Yes    Alcohol/week: 1.2 oz    Types: 2 Cans of beer per week  . Drug use: No   Additional social history: lives alone in an apartment  Please also refer to relevant sections of EMR.  Family History: Family History  Problem Relation Age of Onset  . Cancer Mother        colon Cancer  . Hyperlipidemia Mother   . Diabetes Mother   . Heart disease Father   . Hypertension Father   . Cancer Sister        Uterine  . Diabetes Sister   . Diabetes Brother    (If not completed, MUST add something in)  Allergies and Medications: Allergies  Allergen Reactions  . Glipizide Other (See Comments)    REACTION IS SIDE EFFECT Severe hypoglycemia to 40s.    No current facility-administered medications on file prior to encounter.    Current Outpatient Medications  on File Prior to Encounter  Medication Sig Dispense Refill  . acetaminophen (TYLENOL) 500 MG tablet Take 1,000 mg by mouth 3 (three) times daily as needed for moderate pain.    Marland Kitchen aspirin EC 81 MG tablet Take 162 mg by mouth daily.    Marland Kitchen atorvastatin (LIPITOR) 40 MG tablet Take 1 tablet (40 mg total) by mouth daily. 90 tablet 1  . benazepril (LOTENSIN) 5 MG tablet Take 1 tablet (5 mg total) by mouth daily. 90 tablet 2  . BYSTOLIC 20 MG TABS TAKE 1 TABLET BY MOUTH EVERY DAY 30 tablet 0  . clopidogrel (PLAVIX) 75 MG tablet Take 1 tablet (75 mg total) daily by mouth. 30 tablet 5  . cyclobenzaprine (FLEXERIL) 10 MG tablet TAKE ONE  1  TABLET BY MOUTH THREE TIMES DAILY AS NEEDED FOR MUSCLE SPASMS (Patient taking differently: Take 10 mg by mouth 3 (three) times daily as needed for muscle spasms. TAKE ONE  1  TABLET BY MOUTH THREE TIMES DAILY AS NEEDED FOR MUSCLE SPASMS) 30 tablet 3  . dextromethorphan-guaiFENesin (MUCINEX DM) 30-600 MG 12hr tablet Take  1 tablet by mouth 2 (two) times daily as needed for cough. 30 tablet 0  . ezetimibe (ZETIA) 10 MG tablet Take 1 tablet (10 mg total) by mouth daily. 90 tablet 2  . FLUoxetine (PROZAC) 10 MG capsule Take 1 capsule (10 mg total) by mouth daily. 90 capsule 2  . metFORMIN (GLUCOPHAGE) 1000 MG tablet Take 1 tablet (1,000 mg total) by mouth 2 (two) times daily with a meal. 180 tablet 2  . pantoprazole (PROTONIX) 40 MG tablet Take 1 tablet (40 mg total) by mouth daily. 90 tablet 2  . Phenylephrine-DM-GG (MUCINEX FAST-MAX CONGEST COUGH) 2.5-5-100 MG/5ML LIQD Take 30 mLs by mouth every 12 (twelve) hours as needed (cough).    . benazepril (LOTENSIN) 5 MG tablet TAKE 1 TABLET BY MOUTH EVERY DAY (Patient not taking: Reported on 03/27/2018) 30 tablet 0  . clopidogrel (PLAVIX) 75 MG tablet Take 1 tablet (75 mg total) by mouth daily. (Patient not taking: Reported on 03/27/2018) 30 tablet 5  . clopidogrel (PLAVIX) 75 MG tablet Take 1 tablet (75 mg total) by mouth daily. (Patient not taking: Reported on 03/27/2018) 30 tablet 5  . diclofenac (VOLTAREN) 75 MG EC tablet Take 1 tablet (75 mg total) by mouth 2 (two) times daily. (Patient not taking: Reported on 03/27/2018) 30 tablet 0  . docusate sodium (COLACE) 100 MG capsule Take 1 capsule (100 mg total) by mouth 2 (two) times daily. (Patient not taking: Reported on 03/27/2018) 60 capsule 2  . FLUoxetine (PROZAC) 10 MG capsule TAKE 1 CAPSULE BY MOUTH EVERY DAY (Patient not taking: Reported on 03/27/2018) 30 capsule 0  . gabapentin (NEURONTIN) 800 MG tablet TAKE 1.5 TABLETS (1,200 MG TOTAL) BY MOUTH 3 (THREE) TIMES DAILY. (Patient not taking: Reported on 03/27/2018) 135 tablet 11  . metFORMIN (GLUCOPHAGE) 1000 MG tablet TAKE 1 TABLET (1,000 MG TOTAL) BY MOUTH 2 (TWO) TIMES DAILY WITH A MEAL. (Patient not taking: Reported on 03/27/2018) 60 tablet 0  . Nebivolol HCl (BYSTOLIC) 20 MG TABS Take 1 tablet (20 mg total) by mouth daily. (Patient not taking: Reported on 03/27/2018) 90 tablet 2  .  pantoprazole (PROTONIX) 40 MG tablet TAKE 1 TABLET BY MOUTH EVERY DAY (Patient not taking: Reported on 03/27/2018) 30 tablet 0    Objective: BP 135/73   Pulse 68   Temp 98.2 F (36.8 C) (Oral)   Resp 16   SpO2  100%  Exam: General: obese male, alert and oriented, appears pale and fatigued, NAD/nontoxic appearing Eyes: pale conjunctiva, PERRL ENTM: no lymphadenopathy, oropharynx clear, mucous membranes dry Neck: supple, nontender Cardiovascular: 3/6 systolic murmur, regular rhythm, normal rate, capillary refill ~2 sec Respiratory: CTAB Gastrointestinal: nontender to palpation, soft, nondistended MSK: scars from previous stent placement on L leg, which is dusky compared to R leg, both legs warm to palpation Derm: no rashes, pale skin Neuro: AAO x 3, no focal weakness, normal strength Psych: good insight, appropriate mood and affect  Labs and Imaging: CBC BMET  Recent Labs  Lab 03/27/18 1245  WBC 4.8  HGB 5.4*  HCT 21.3*  PLT 208   Recent Labs  Lab 03/27/18 1245  NA 134*  K 3.9  CL 100*  CO2 23  BUN 6  CREATININE 1.01  GLUCOSE 99  CALCIUM 8.8*       Winfrey, Alcario Drought, MD 03/27/2018, 3:02 PM PGY-1, [place] Intern pager: [phone], text pages welcome   I have separately seen and examined the patient. I have discussed the findings and exam with Dr. Shan Levans and agree with the above note.  My changes/additions are outlined in BLUE.   Everrett Coombe, MD PGY-2 [facility]

## 2018-03-27 NOTE — ED Triage Notes (Signed)
Pt presents for evaluation of dizziness and low hemoglobin. Pt states dizziness and sob with exertion x 3-4 days, saw PCP yesterday, called today and told to come to ED for low hemoglobin.

## 2018-03-27 NOTE — Telephone Encounter (Signed)
[facility] called regarding patients critical lab value- HGB 5.7. MD in office this PM. Text page sent via [place] for MD to review.  Wallace Cullens, RN

## 2018-03-27 NOTE — Progress Notes (Signed)
[name] is a 67 y.o. male patient admitted from ED awake, alert - oriented  X 4 - no acute distress noted.  VSS - Blood pressure 129/67, pulse 68, temperature 98.2 F (36.8 C), temperature source Oral, resp. rate 17, SpO2 100 %.    IV in place, occlusive dsg intact without redness.  Orientation to room, and floor completed with information packet given to patient/family.  Patient declined safety video at this time.  Admission INP armband ID verified with patient/family, and in place.   SR up x 2, fall assessment complete, with patient and family able to verbalize understanding of risk associated with falls, and verbalized understanding to call nsg before up out of bed.  Call light within reach, patient able to voice, and demonstrate understanding.  Skin, clean-dry- intact without evidence of bruising, or skin tears.   No evidence of skin break down noted on exam.     Will cont to eval and treat per MD orders.  Celine Ahr, RN 03/27/2018 5:14 PM

## 2018-03-27 NOTE — Telephone Encounter (Signed)
Spoke with patient regarding his low hemoglobin of 5.7. This is significantly lower than 9.2 in December. This was obtained in setting of complaint of increased leg pain at office visit yesterday and history of iron deficiency anemia. Patient says he feels a little short of breath today but denies dark stools or chest pain. Recommended going to the emergency department for blood transfusion and likely admission for blood loss work-up. Patient has history of polyps on colonoscopy 07/2012 found to be tubular adenomas. He has also had gastritis. He was admitted 08/2015 for severe iron deficiency anemia and received feraheme and 2u pRBC.   Olene Floss, MD [place], PGY-3

## 2018-03-27 NOTE — Telephone Encounter (Signed)
Spoke with Dr. Ola Spurr. Aware of HGB result. Will reach out to patient- recommends he got to ED for transfusion. Wallace Cullens, RN

## 2018-03-27 NOTE — ED Notes (Signed)
Attempted report 

## 2018-03-27 NOTE — Assessment & Plan Note (Signed)
-   Right > left leg burning pain. Sounds at least partially neuropathic in nature but also with ongoing claudication symptoms, though much improved since stenting in December. Unlikely DVT with recent negative LE doppler and calf pain remaining stable. Do not think symptoms are from pinched nerve in back given negative straight leg raise and distribution of lower legs only. - Suggested trying amitriptyline 25 mg QHS in place of gabapentin at this time. - Could try topical capsaicin. - Will check labs that could be causing leg pain (B12--on metformin, vitamin D, CBC with ferritin)

## 2018-03-28 DIAGNOSIS — D649 Anemia, unspecified: Secondary | ICD-10-CM

## 2018-03-28 DIAGNOSIS — I739 Peripheral vascular disease, unspecified: Secondary | ICD-10-CM

## 2018-03-28 LAB — COMPREHENSIVE METABOLIC PANEL
ALT: 8 U/L — ABNORMAL LOW (ref 17–63)
AST: 17 U/L (ref 15–41)
Albumin: 3.5 g/dL (ref 3.5–5.0)
Alkaline Phosphatase: 61 U/L (ref 38–126)
Anion gap: 11 (ref 5–15)
BUN: 9 mg/dL (ref 6–20)
CO2: 23 mmol/L (ref 22–32)
Calcium: 8.6 mg/dL — ABNORMAL LOW (ref 8.9–10.3)
Chloride: 100 mmol/L — ABNORMAL LOW (ref 101–111)
Creatinine, Ser: 0.94 mg/dL (ref 0.61–1.24)
GFR calc Af Amer: >60 mL/min (ref >60)
GFR calc non Af Amer: >60 mL/min (ref >60)
Glucose, Bld: 111 mg/dL — ABNORMAL HIGH (ref 65–99)
Potassium: 4.3 mmol/L (ref 3.5–5.1)
Sodium: 134 mmol/L — ABNORMAL LOW (ref 135–145)
Total Bilirubin: 0.8 mg/dL (ref 0.3–1.2)
Total Protein: 6.7 g/dL (ref 6.5–8.1)

## 2018-03-28 LAB — CBC
HCT: 28.9 % — ABNORMAL LOW (ref 39.0–52.0)
Hemoglobin: 8.1 g/dL — ABNORMAL LOW (ref 13.0–17.0)
MCH: 18.2 pg — ABNORMAL LOW (ref 26.0–34.0)
MCHC: 28 g/dL — ABNORMAL LOW (ref 30.0–36.0)
MCV: 65.1 fL — ABNORMAL LOW (ref 78.0–100.0)
Platelets: 185 10*3/uL (ref 150–400)
RBC: 4.44 MIL/uL (ref 4.22–5.81)
RDW: 28.7 % — ABNORMAL HIGH (ref 11.5–15.5)
WBC: 6 10*3/uL (ref 4.0–10.5)

## 2018-03-28 LAB — PREPARE RBC (CROSSMATCH)

## 2018-03-28 LAB — HEMOGLOBIN AND HEMATOCRIT, BLOOD
HCT: 27.5 % — ABNORMAL LOW (ref 39.0–52.0)
Hemoglobin: 7.5 g/dL — ABNORMAL LOW (ref 13.0–17.0)

## 2018-03-28 LAB — HAPTOGLOBIN: Haptoglobin: 55 mg/dL (ref 34–200)

## 2018-03-28 LAB — GLUCOSE, CAPILLARY: Glucose-Capillary: 105 mg/dL — ABNORMAL HIGH (ref 65–99)

## 2018-03-28 MED ORDER — PANTOPRAZOLE SODIUM 40 MG PO TBEC
80.0000 mg | DELAYED_RELEASE_TABLET | Freq: Every day | ORAL | Status: DC
  Administered 2018-03-28 – 2018-03-30 (×3): 80 mg via ORAL
  Filled 2018-03-28 (×3): qty 2

## 2018-03-28 MED ORDER — SODIUM CHLORIDE 0.9 % IV SOLN
Freq: Once | INTRAVENOUS | Status: AC
  Administered 2018-03-28: 13:00:00 via INTRAVENOUS

## 2018-03-28 NOTE — H&P (View-Only) (Signed)
Reason for Consult: Symptomatic anemia. Referring Physician: FPTS.  [name] is an 67 y.o. male.  HPI: 67 year old black male with a longstanding history of iron deficiency anemia and multiple medical problems listed below, admitted yesterday with a hemoglobin of 5.5 gm/dl and an iron level of 13. He has been having some SOB with exertion and has been very fatigued PTA. He denies having any melena or hematochezia. He denies having any change in bowel habits. He has had regular BM's. He has since received 2 units of PRBC's his hemoglobin is upto 7.5 gm/dl today. He is scheduled to receive another 1 unit of PRBC's. He has had several EGD/Colonoscopies done in the past, 1999, 2007, 2013 and again 2016 when he was noted to have a few antral erosions and a tubular adenoma were removed. He had a small bowel AVM's ablated in 11/2015.  According to my records his mother had "uetrine cancer" and not colon cancer. Patient is on Aspirin and Plavix at home; these have been in hold since admission.    Past Medical History:  Diagnosis Date  . Anemia   . Arthritis    OA  . Cervical radiculopathy    Dr. Vertell Limber neurosurgery  . Chronic lower back pain   . Diabetes mellitus    takes Metformin daily  . GERD (gastroesophageal reflux disease)    takes Protonix daily  . History of blood transfusion    "related to low HgB" ((09/10/2015  . Hyperlipidemia    takes Vytorin daily  . Hypertension    takes Benazepril and Bystolic daily  . PAD (peripheral artery disease) ([place])   . Pneumonia   . Shortness of breath dyspnea    with exertion  . Tobacco user   . Toe fracture, right 05/09/2011   Past Surgical History:  Procedure Laterality Date  . ABDOMINAL AORTOGRAM W/LOWER EXTREMITY N/A 12/11/2017   Procedure: ABDOMINAL AORTOGRAM W/LOWER EXTREMITY;  Surgeon: Angelia Mould, MD;  Location: [facility];  Service: Cardiovascular;  Laterality: N/A;  . ANTERIOR CERVICAL DECOMP/DISCECTOMY FUSION   03/08/12   C6-7  . ANTERIOR CERVICAL DECOMP/DISCECTOMY FUSION  03/08/2012   Procedure: ANTERIOR CERVICAL DECOMPRESSION/DISCECTOMY FUSION 1 LEVEL/HARDWARE REMOVAL;  Surgeon: Erline Levine, MD;  Location: [facility];  Service: Neurosurgery;  Laterality: N/A;  revison of C5-7 anterior cervical decompression with fusion with Cervical Five-Thoracic One anterior cervical decompression with fusion with interbody prothesis plating and bonegraft  . BACK SURGERY  1996  . BYPASS GRAFT FEMORAL-PERONEAL Left 11/11/2016   Procedure: REDO LEFT FEMORAL-PERONEAL BYPASS WITH PROPATEN 6MM X 80CM GRAFT;  Surgeon: Angelia Mould, MD;  Location: [facility] OR;  Service: Vascular;  Laterality: Left;  . COLONOSCOPY W/ BIOPSIES AND POLYPECTOMY  08/17/2012   f/u 5 years, 4 polyps, no high grade dysplasia or malignancy, tubular adenoma, hyperplastic polyops  . ENTEROSCOPY N/A 12/11/2015   Procedure: ENTEROSCOPY;  Surgeon: Carol Ada, MD;  Location: [facility];  Service: Endoscopy;  Laterality: N/A;  . ESOPHAGOGASTRODUODENOSCOPY  08/17/2012   normal esophagus and GEJ, diffuse gastritis with erythema- no malignancy, reactive gastropathy  with focal intestinal metaplasia  . FEMORAL-POPLITEAL BYPASS GRAFT Left 01/06/2016   Procedure: Left  COMMON FEMORAL-BELOW KNEE POPLITEAL ARTERY Bypass using non-reversed translocated saphenous vein graft from left leg;  Surgeon: Mal Misty, MD;  Location: [facility];  Service: Vascular;  Laterality: Left;  . GIVENS CAPSULE STUDY N/A 11/24/2015   Procedure: GIVENS CAPSULE STUDY;  Surgeon: Juanita Craver, MD;  Location: [facility];  Service: Endoscopy;  Laterality: N/A;  . INGUINAL HERNIA REPAIR  1990's   right  . INTRAOPERATIVE ARTERIOGRAM Left 01/06/2016   Procedure: INTRA OPERATIVE ARTERIOGRAM LEFT LOWER LEG;  Surgeon: Mal Misty, MD;  Location: [facility];  Service: Vascular;  Laterality: Left;  . INTRAOPERATIVE ARTERIOGRAM Left 11/11/2016   Procedure: INTRA OPERATIVE ARTERIOGRAM LEFT LOWER  EXTRIMITY;  Surgeon: Angelia Mould, MD;  Location: [facility];  Service: Vascular;  Laterality: Left;  . IR ANGIOGRAM FOLLOW UP STUDY  12/11/2017  . IR GENERIC HISTORICAL  10/24/2016   IR ANGIOGRAM FOLLOW UP STUDY  . LOWER EXTREMITY ANGIOGRAM Bilateral 07/30/2015   Procedure: Lower Extremity Angiogram;  Surgeon: Conrad Blacksburg, MD;  Location: [facility];  Service: Cardiovascular;  Laterality: Bilateral;  . [place]   "lower"  . PERIPHERAL VASCULAR CATHETERIZATION N/A 07/30/2015   Procedure: Abdominal Aortogram;  Surgeon: Conrad Coppock, MD;  Location: [facility];  Service: Cardiovascular;  Laterality: N/A;  . PERIPHERAL VASCULAR CATHETERIZATION N/A 10/24/2016   Procedure: Abdominal Aortogram;  Surgeon: Angelia Mould, MD;  Location: [facility];  Service: Cardiovascular;  Laterality: N/A;  . PERIPHERAL VASCULAR CATHETERIZATION N/A 10/24/2016   Procedure: Lower Extremity Angiography;  Surgeon: Angelia Mould, MD;  Location: [facility];  Service: Cardiovascular;  Laterality: N/A;  . PERIPHERAL VASCULAR INTERVENTION Right 12/11/2017   Procedure: PERIPHERAL VASCULAR INTERVENTION;  Surgeon: Angelia Mould, MD;  Location: [facility];  Service: Cardiovascular;  Laterality: Right;  Marland Kitchen VASCULAR SURGERY  ~ 2007   Stent SFA   . VEIN HARVEST Left 01/06/2016   Procedure: LEFT GREATER [place];  Surgeon: Mal Misty, MD;  Location: [facility] OR;  Service: Vascular;  Laterality: Left;   Family History  Problem Relation Age of Onset  . Cancer Mother        UTERINE CANCER  . Hyperlipidemia Mother   . Diabetes Mother   . Heart disease Father   . Hypertension Father   . Cancer Sister        Uterine  . Diabetes Sister   . Diabetes Brother    Social History:  reports that he has been smoking cigars.  He has smoked for the past 24.00 years. He has never used smokeless tobacco. He reports that he drinks about 1.2 oz of  alcohol per week. He reports that he does not use drugs.  Allergies:  Allergies  Allergen Reactions  . Glipizide Other (See Comments)    REACTION IS SIDE EFFECT Severe hypoglycemia to 40s.    Medications: I have reviewed the patient's current medications.  Results for orders placed or performed during the hospital encounter of 03/27/18 (from the past 48 hour(s))  Basic metabolic panel     Status: Abnormal   Collection Time: 03/27/18 12:45 PM  Result Value Ref Range   Sodium 134 (L) 135 - 145 mmol/L   Potassium 3.9 3.5 - 5.1 mmol/L   Chloride 100 (L) 101 - 111 mmol/L   CO2 23 22 - 32 mmol/L   Glucose, Bld 99 65 - 99 mg/dL   BUN 6 6 - 20 mg/dL   Creatinine, Ser 1.01 0.61 - 1.24 mg/dL   Calcium 8.8 (L) 8.9 - 10.3 mg/dL   GFR calc non Af Amer >60 >60 mL/min   GFR calc Af Amer >60 >60 mL/min    Comment: (NOTE) The eGFR has been calculated using the CKD EPI equation. This calculation  has not been validated in all clinical situations. eGFR's persistently <60 mL/min signify possible Chronic Kidney Disease.    Anion gap 11 5 - 15    Comment: Performed at [facility] [address], [place], [place] [address]  CBC     Status: Abnormal   Collection Time: 03/27/18 12:45 PM  Result Value Ref Range   WBC 4.8 4.0 - 10.5 K/uL   RBC 3.68 (L) 4.22 - 5.81 MIL/uL   Hemoglobin 5.4 (LL) 13.0 - 17.0 g/dL    Comment: REPEATED TO VERIFY CRITICAL RESULT CALLED TO, READ BACK BY AND VERIFIED WITH: K COBB,RN 1312 03/27/18 D BRADLEY    HCT 21.3 (L) 39.0 - 52.0 %   MCV 57.9 (L) 78.0 - 100.0 fL   MCH 14.7 (L) 26.0 - 34.0 pg   MCHC 25.4 (L) 30.0 - 36.0 g/dL   RDW 23.7 (H) 11.5 - 15.5 %   Platelets 208 150 - 400 K/uL    Comment: Performed at [facility], [place] [address], [place], [place] [address]  Type and screen     Status: None (Preliminary result)   Collection Time: 03/27/18  2:53 PM  Result Value Ref Range   ABO/RH(D) A POS    Antibody Screen NEG    Sample Expiration 03/30/2018     Unit Number [id number]    Blood Component Type RED CELLS,LR    Unit division 00    Status of Unit ISSUED,FINAL    Transfusion Status OK TO TRANSFUSE    Crossmatch Result Compatible    Unit Number [id number]    Blood Component Type RED CELLS,LR    Unit division 00    Status of Unit ISSUED,FINAL    Transfusion Status OK TO TRANSFUSE    Crossmatch Result Compatible    Unit Number [id number]    Blood Component Type RED CELLS,LR    Unit division 00    Status of Unit ISSUED    Transfusion Status OK TO TRANSFUSE    Crossmatch Result      Compatible Performed at [facility], [place] [address], [place], [place] [address]   Prepare RBC     Status: None   Collection Time: 03/27/18  3:56 PM  Result Value Ref Range   Order Confirmation      ORDER PROCESSED BY BLOOD BANK Performed at [facility], [place] [address] [place], [place] [address]   Folate     Status: None   Collection Time: 03/27/18  4:37 PM  Result Value Ref Range   Folate 10.8 >5.9 ng/mL    Comment: Performed at [facility], [place] [address] [place], [place] [address]  Iron and TIBC     Status: Abnormal   Collection Time: 03/27/18  4:37 PM  Result Value Ref Range   Iron 13 (L) 45 - 182 ug/dL   TIBC 554 (H) 250 - 450 ug/dL   Saturation Ratios 2 (L) 17.9 - 39.5 %   UIBC 541 ug/dL    Comment: Performed at [facility] [address] [place], [place] [address]  Reticulocytes     Status: Abnormal   Collection Time: 03/27/18  4:37 PM  Result Value Ref Range   Retic Ct Pct 1.9 0.4 - 3.1 %   RBC. 3.86 (L) 4.22 - 5.81 MIL/uL   Retic Count, Absolute 73.3 19.0 - 186.0 K/uL    Comment: Performed at [facility] [place],  [place] [address]  Haptoglobin     Status: None   Collection Time: 03/27/18  4:37 PM  Result Value Ref Range   Haptoglobin 55 34 - 200 mg/dL    Comment: (NOTE) Performed At: [facility] [place], [place] [id number] Rush Farmer  MD [id number] Performed at [facility], [place] [address], [place], [place] [address]   CBC with Differential/Platelet     Status: Abnormal   Collection Time: 03/27/18  4:37 PM  Result Value Ref Range   WBC 5.3 4.0 - 10.5 K/uL   RBC 3.86 (L) 4.22 - 5.81 MIL/uL   Hemoglobin 5.5 (LL) 13.0 - 17.0 g/dL    Comment: REPEATED TO VERIFY SPECIMEN CHECKED FOR CLOTS CRITICAL VALUE NOTED.  VALUE IS CONSISTENT WITH PREVIOUSLY REPORTED AND CALLED VALUE.    HCT 22.4 (L) 39.0 - 52.0 %   MCV 58.0 (L) 78.0 - 100.0 fL   MCH 14.2 (L) 26.0 - 34.0 pg   MCHC 24.6 (L) 30.0 - 36.0 g/dL   RDW 23.6 (H) 11.5 - 15.5 %   Platelets 240 150 - 400 K/uL   Neutrophils Relative % 58 %   Neutro Abs 3.1 1.7 - 7.7 K/uL   Lymphocytes Relative 31 %   Lymphs Abs 1.6 0.7 - 4.0 K/uL   Monocytes Relative 8 %   Monocytes Absolute 0.4 0.1 - 1.0 K/uL   Eosinophils Relative 3 %   Eosinophils Absolute 0.2 0.0 - 0.7 K/uL   Basophils Relative 0 %   Basophils Absolute 0.0 0.0 - 0.1 K/uL    Comment: Performed at [facility] [address] [place], [place] [address]  Comprehensive metabolic panel     Status: Abnormal   Collection Time: 03/28/18  4:23 AM  Result Value Ref Range   Sodium 134 (L) 135 - 145 mmol/L   Potassium 4.3 3.5 - 5.1 mmol/L   Chloride 100 (L) 101 - 111 mmol/L   CO2 23 22 - 32 mmol/L   Glucose, Bld 111 (H) 65 - 99 mg/dL   BUN 9 6 - 20 mg/dL   Creatinine, Ser 0.94 0.61 - 1.24 mg/dL   Calcium 8.6 (L) 8.9 - 10.3 mg/dL   Total Protein 6.7 6.5 - 8.1 g/dL   Albumin 3.5 3.5 - 5.0 g/dL   AST 17 15 - 41 U/L   ALT 8 (L) 17 - 63 U/L   Alkaline Phosphatase 61 38 - 126 U/L   Total Bilirubin 0.8 0.3 - 1.2 mg/dL   GFR calc non Af Amer >60 >60 mL/min   GFR calc Af Amer >60 >60 mL/min    Comment: (NOTE) The eGFR has been calculated using the CKD EPI equation. This calculation has not been validated in all clinical situations. eGFR's persistently <60 mL/min signify possible Chronic Kidney Disease.     Anion gap 11 5 - 15    Comment: Performed at [facility] [address], [place], [place] [address]  Hemoglobin and hematocrit, blood     Status: Abnormal   Collection Time: 03/28/18  4:23 AM  Result Value Ref Range   Hemoglobin 7.5 (L) 13.0 - 17.0 g/dL    Comment: POST TRANSFUSION SPECIMEN   HCT 27.5 (L) 39.0 - 52.0 %    Comment: Performed at [facility] [address], [place], [place] [address]  Glucose, capillary     Status: Abnormal   Collection Time: 03/28/18  5:39 AM  Result Value Ref Range  Glucose-Capillary 105 (H) 65 - 99 mg/dL  Prepare RBC     Status: None   Collection Time: 03/28/18 11:37 AM  Result Value Ref Range   Order Confirmation      ORDER PROCESSED BY BLOOD BANK Performed at [facility], [place] [address] [place], [place] [address]    Review of Systems  Constitutional: Positive for malaise/fatigue. Negative for chills, diaphoresis, fever and weight loss.  HENT: Negative.   Eyes: Negative.   Respiratory: Positive for shortness of breath.   Cardiovascular: Negative.   Gastrointestinal: Negative.   Genitourinary: Negative.   Musculoskeletal: Positive for joint pain.  Skin: Negative.   Neurological: Negative for tingling.  Endo/Heme/Allergies: Negative.   Psychiatric/Behavioral: The patient has insomnia.    Blood pressure (!) 141/74, pulse 63, temperature 98.6 F (37 C), temperature source Oral, resp. rate 19, height _0  (1.702 m), weight 90.5 kg (199 lb 8.3 oz), SpO2 100 %. Physical Exam  Constitutional: He is oriented to person, place, and time. He appears well-developed and well-nourished.  HENT:  Head: Normocephalic and atraumatic.  Eyes: Pupils are equal, round, and reactive to light. Conjunctivae and EOM are normal.  Neck: Normal range of motion. Neck supple.  Cardiovascular: Normal rate and regular rhythm.  Respiratory: Effort normal and breath sounds normal.  GI: Soft. Bowel sounds are normal.  Neurological: He is alert  and oriented to person, place, and time.  Skin: Skin is warm and dry.  Psychiatric: He has a normal mood and affect. His behavior is normal. Judgment and thought content normal.   Assessment/Plan: 1) Iron deficiency anemia: Will plan to do an enteroscopy tomorrow.  2) GERD/History of colonic polyps-not due for a colonoscopy till 10/2020.  3) HTN.  4) AODM/Diabetic neuropathy. Marland Kitchen5) PAD Bao Coreas 03/28/2018, 2:57 PM

## 2018-03-28 NOTE — Consult Note (Signed)
Reason for Consult: Symptomatic anemia. Referring Physician: FPTS.  [name] is an 67 y.o. male.  HPI: 67 year old black male with a longstanding history of iron deficiency anemia and multiple medical problems listed below, admitted yesterday with a hemoglobin of 5.5 gm/dl and an iron level of 13. He has been having some SOB with exertion and has been very fatigued PTA. He denies having any melena or hematochezia. He denies having any change in bowel habits. He has had regular BM's. He has since received 2 units of PRBC's his hemoglobin is upto 7.5 gm/dl today. He is scheduled to receive another 1 unit of PRBC's. He has had several EGD/Colonoscopies done in the past, 1999, 2007, 2013 and again 2016 when he was noted to have a few antral erosions and a tubular adenoma were removed. He had a small bowel AVM's ablated in 11/2015.  According to my records his mother had "uetrine cancer" and not colon cancer. Patient is on Aspirin and Plavix at home; these have been in hold since admission.    Past Medical History:  Diagnosis Date  . Anemia   . Arthritis    OA  . Cervical radiculopathy    Dr. Vertell Limber neurosurgery  . Chronic lower back pain   . Diabetes mellitus    takes Metformin daily  . GERD (gastroesophageal reflux disease)    takes Protonix daily  . History of blood transfusion    "related to low HgB" ((09/10/2015  . Hyperlipidemia    takes Vytorin daily  . Hypertension    takes Benazepril and Bystolic daily  . PAD (peripheral artery disease) ([place])   . Pneumonia   . Shortness of breath dyspnea    with exertion  . Tobacco user   . Toe fracture, right 05/09/2011   Past Surgical History:  Procedure Laterality Date  . ABDOMINAL AORTOGRAM W/LOWER EXTREMITY N/A 12/11/2017   Procedure: ABDOMINAL AORTOGRAM W/LOWER EXTREMITY;  Surgeon: Angelia Mould, MD;  Location: [facility];  Service: Cardiovascular;  Laterality: N/A;  . ANTERIOR CERVICAL DECOMP/DISCECTOMY FUSION   03/08/12   C6-7  . ANTERIOR CERVICAL DECOMP/DISCECTOMY FUSION  03/08/2012   Procedure: ANTERIOR CERVICAL DECOMPRESSION/DISCECTOMY FUSION 1 LEVEL/HARDWARE REMOVAL;  Surgeon: Erline Levine, MD;  Location: [facility];  Service: Neurosurgery;  Laterality: N/A;  revison of C5-7 anterior cervical decompression with fusion with Cervical Five-Thoracic One anterior cervical decompression with fusion with interbody prothesis plating and bonegraft  . BACK SURGERY  1996  . BYPASS GRAFT FEMORAL-PERONEAL Left 11/11/2016   Procedure: REDO LEFT FEMORAL-PERONEAL BYPASS WITH PROPATEN 6MM X 80CM GRAFT;  Surgeon: Angelia Mould, MD;  Location: [facility] OR;  Service: Vascular;  Laterality: Left;  . COLONOSCOPY W/ BIOPSIES AND POLYPECTOMY  08/17/2012   f/u 5 years, 4 polyps, no high grade dysplasia or malignancy, tubular adenoma, hyperplastic polyops  . ENTEROSCOPY N/A 12/11/2015   Procedure: ENTEROSCOPY;  Surgeon: Carol Ada, MD;  Location: [facility]OS[facility];  Service: Endoscopy;  Laterality: N/A;  . ESOPHAGOGASTRODUODENOSCOPY  08/17/2012   normal esophagus and GEJ, diffuse gastritis with erythema- no malignancy, reactive gastropathy  with focal intestinal metaplasia  . FEMORAL-POPLITEAL BYPASS GRAFT Left 01/06/2016   Procedure: Left  COMMON FEMORAL-BELOW KNEE POPLITEAL ARTERY Bypass using non-reversed translocated saphenous vein graft from left leg;  Surgeon: Mal Misty, MD;  Location: [facility];  Service: Vascular;  Laterality: Left;  . GIVENS CAPSULE STUDY N/A 11/24/2015   Procedure: GIVENS CAPSULE STUDY;  Surgeon: Juanita Craver, MD;  Location: [facility];  Service: Endoscopy;  Laterality: N/A;  . INGUINAL HERNIA REPAIR  1990's   right  . INTRAOPERATIVE ARTERIOGRAM Left 01/06/2016   Procedure: INTRA OPERATIVE ARTERIOGRAM LEFT LOWER LEG;  Surgeon: Mal Misty, MD;  Location: [facility];  Service: Vascular;  Laterality: Left;  . INTRAOPERATIVE ARTERIOGRAM Left 11/11/2016   Procedure: INTRA OPERATIVE ARTERIOGRAM LEFT LOWER  EXTRIMITY;  Surgeon: Angelia Mould, MD;  Location: [facility];  Service: Vascular;  Laterality: Left;  . IR ANGIOGRAM FOLLOW UP STUDY  12/11/2017  . IR GENERIC HISTORICAL  10/24/2016   IR ANGIOGRAM FOLLOW UP STUDY  . LOWER EXTREMITY ANGIOGRAM Bilateral 07/30/2015   Procedure: Lower Extremity Angiogram;  Surgeon: Conrad Blacksburg, MD;  Location: [facility];  Service: Cardiovascular;  Laterality: Bilateral;  . [place]   "lower"  . PERIPHERAL VASCULAR CATHETERIZATION N/A 07/30/2015   Procedure: Abdominal Aortogram;  Surgeon: Conrad Coppock, MD;  Location: [facility];  Service: Cardiovascular;  Laterality: N/A;  . PERIPHERAL VASCULAR CATHETERIZATION N/A 10/24/2016   Procedure: Abdominal Aortogram;  Surgeon: Angelia Mould, MD;  Location: [facility];  Service: Cardiovascular;  Laterality: N/A;  . PERIPHERAL VASCULAR CATHETERIZATION N/A 10/24/2016   Procedure: Lower Extremity Angiography;  Surgeon: Angelia Mould, MD;  Location: [facility];  Service: Cardiovascular;  Laterality: N/A;  . PERIPHERAL VASCULAR INTERVENTION Right 12/11/2017   Procedure: PERIPHERAL VASCULAR INTERVENTION;  Surgeon: Angelia Mould, MD;  Location: [facility];  Service: Cardiovascular;  Laterality: Right;  Marland Kitchen VASCULAR SURGERY  ~ 2007   Stent SFA   . VEIN HARVEST Left 01/06/2016   Procedure: LEFT GREATER [place];  Surgeon: Mal Misty, MD;  Location: [facility] OR;  Service: Vascular;  Laterality: Left;   Family History  Problem Relation Age of Onset  . Cancer Mother        UTERINE CANCER  . Hyperlipidemia Mother   . Diabetes Mother   . Heart disease Father   . Hypertension Father   . Cancer Sister        Uterine  . Diabetes Sister   . Diabetes Brother    Social History:  reports that he has been smoking cigars.  He has smoked for the past 24.00 years. He has never used smokeless tobacco. He reports that he drinks about 1.2 oz of  alcohol per week. He reports that he does not use drugs.  Allergies:  Allergies  Allergen Reactions  . Glipizide Other (See Comments)    REACTION IS SIDE EFFECT Severe hypoglycemia to 40s.    Medications: I have reviewed the patient's current medications.  Results for orders placed or performed during the hospital encounter of 03/27/18 (from the past 48 hour(s))  Basic metabolic panel     Status: Abnormal   Collection Time: 03/27/18 12:45 PM  Result Value Ref Range   Sodium 134 (L) 135 - 145 mmol/L   Potassium 3.9 3.5 - 5.1 mmol/L   Chloride 100 (L) 101 - 111 mmol/L   CO2 23 22 - 32 mmol/L   Glucose, Bld 99 65 - 99 mg/dL   BUN 6 6 - 20 mg/dL   Creatinine, Ser 1.01 0.61 - 1.24 mg/dL   Calcium 8.8 (L) 8.9 - 10.3 mg/dL   GFR calc non Af Amer >60 >60 mL/min   GFR calc Af Amer >60 >60 mL/min    Comment: (NOTE) The eGFR has been calculated using the CKD EPI equation. This calculation  has not been validated in all clinical situations. eGFR's persistently <60 mL/min signify possible Chronic Kidney Disease.    Anion gap 11 5 - 15    Comment: Performed at [facility] [address], [place], [place] [address]  CBC     Status: Abnormal   Collection Time: 03/27/18 12:45 PM  Result Value Ref Range   WBC 4.8 4.0 - 10.5 K/uL   RBC 3.68 (L) 4.22 - 5.81 MIL/uL   Hemoglobin 5.4 (LL) 13.0 - 17.0 g/dL    Comment: REPEATED TO VERIFY CRITICAL RESULT CALLED TO, READ BACK BY AND VERIFIED WITH: K COBB,RN 1312 03/27/18 D BRADLEY    HCT 21.3 (L) 39.0 - 52.0 %   MCV 57.9 (L) 78.0 - 100.0 fL   MCH 14.7 (L) 26.0 - 34.0 pg   MCHC 25.4 (L) 30.0 - 36.0 g/dL   RDW 23.7 (H) 11.5 - 15.5 %   Platelets 208 150 - 400 K/uL    Comment: Performed at [facility], [place] [address], [place], [place] [address]  Type and screen     Status: None (Preliminary result)   Collection Time: 03/27/18  2:53 PM  Result Value Ref Range   ABO/RH(D) A POS    Antibody Screen NEG    Sample Expiration 03/30/2018     Unit Number [id number]    Blood Component Type RED CELLS,LR    Unit division 00    Status of Unit ISSUED,FINAL    Transfusion Status OK TO TRANSFUSE    Crossmatch Result Compatible    Unit Number [id number]    Blood Component Type RED CELLS,LR    Unit division 00    Status of Unit ISSUED,FINAL    Transfusion Status OK TO TRANSFUSE    Crossmatch Result Compatible    Unit Number [id number]    Blood Component Type RED CELLS,LR    Unit division 00    Status of Unit ISSUED    Transfusion Status OK TO TRANSFUSE    Crossmatch Result      Compatible Performed at [facility], [place] [address], [place], [place] [address]   Prepare RBC     Status: None   Collection Time: 03/27/18  3:56 PM  Result Value Ref Range   Order Confirmation      ORDER PROCESSED BY BLOOD BANK Performed at [facility], [place] [address] [place], [place] [address]   Folate     Status: None   Collection Time: 03/27/18  4:37 PM  Result Value Ref Range   Folate 10.8 >5.9 ng/mL    Comment: Performed at [facility], [place] [address] [place], [place] [address]  Iron and TIBC     Status: Abnormal   Collection Time: 03/27/18  4:37 PM  Result Value Ref Range   Iron 13 (L) 45 - 182 ug/dL   TIBC 554 (H) 250 - 450 ug/dL   Saturation Ratios 2 (L) 17.9 - 39.5 %   UIBC 541 ug/dL    Comment: Performed at [facility] [address] [place], [place] [address]  Reticulocytes     Status: Abnormal   Collection Time: 03/27/18  4:37 PM  Result Value Ref Range   Retic Ct Pct 1.9 0.4 - 3.1 %   RBC. 3.86 (L) 4.22 - 5.81 MIL/uL   Retic Count, Absolute 73.3 19.0 - 186.0 K/uL    Comment: Performed at [facility] [place],  [place] [address]  Haptoglobin     Status: None   Collection Time: 03/27/18  4:37 PM  Result Value Ref Range   Haptoglobin 55 34 - 200 mg/dL    Comment: (NOTE) Performed At: [facility] [place], [place] [id number] Rush Farmer  MD [id number] Performed at [facility], [place] [address], [place], [place] [address]   CBC with Differential/Platelet     Status: Abnormal   Collection Time: 03/27/18  4:37 PM  Result Value Ref Range   WBC 5.3 4.0 - 10.5 K/uL   RBC 3.86 (L) 4.22 - 5.81 MIL/uL   Hemoglobin 5.5 (LL) 13.0 - 17.0 g/dL    Comment: REPEATED TO VERIFY SPECIMEN CHECKED FOR CLOTS CRITICAL VALUE NOTED.  VALUE IS CONSISTENT WITH PREVIOUSLY REPORTED AND CALLED VALUE.    HCT 22.4 (L) 39.0 - 52.0 %   MCV 58.0 (L) 78.0 - 100.0 fL   MCH 14.2 (L) 26.0 - 34.0 pg   MCHC 24.6 (L) 30.0 - 36.0 g/dL   RDW 23.6 (H) 11.5 - 15.5 %   Platelets 240 150 - 400 K/uL   Neutrophils Relative % 58 %   Neutro Abs 3.1 1.7 - 7.7 K/uL   Lymphocytes Relative 31 %   Lymphs Abs 1.6 0.7 - 4.0 K/uL   Monocytes Relative 8 %   Monocytes Absolute 0.4 0.1 - 1.0 K/uL   Eosinophils Relative 3 %   Eosinophils Absolute 0.2 0.0 - 0.7 K/uL   Basophils Relative 0 %   Basophils Absolute 0.0 0.0 - 0.1 K/uL    Comment: Performed at [facility] [address] [place], [place] [address]  Comprehensive metabolic panel     Status: Abnormal   Collection Time: 03/28/18  4:23 AM  Result Value Ref Range   Sodium 134 (L) 135 - 145 mmol/L   Potassium 4.3 3.5 - 5.1 mmol/L   Chloride 100 (L) 101 - 111 mmol/L   CO2 23 22 - 32 mmol/L   Glucose, Bld 111 (H) 65 - 99 mg/dL   BUN 9 6 - 20 mg/dL   Creatinine, Ser 0.94 0.61 - 1.24 mg/dL   Calcium 8.6 (L) 8.9 - 10.3 mg/dL   Total Protein 6.7 6.5 - 8.1 g/dL   Albumin 3.5 3.5 - 5.0 g/dL   AST 17 15 - 41 U/L   ALT 8 (L) 17 - 63 U/L   Alkaline Phosphatase 61 38 - 126 U/L   Total Bilirubin 0.8 0.3 - 1.2 mg/dL   GFR calc non Af Amer >60 >60 mL/min   GFR calc Af Amer >60 >60 mL/min    Comment: (NOTE) The eGFR has been calculated using the CKD EPI equation. This calculation has not been validated in all clinical situations. eGFR's persistently <60 mL/min signify possible Chronic Kidney Disease.     Anion gap 11 5 - 15    Comment: Performed at [facility] [address], [place], [place] [address]  Hemoglobin and hematocrit, blood     Status: Abnormal   Collection Time: 03/28/18  4:23 AM  Result Value Ref Range   Hemoglobin 7.5 (L) 13.0 - 17.0 g/dL    Comment: POST TRANSFUSION SPECIMEN   HCT 27.5 (L) 39.0 - 52.0 %    Comment: Performed at [facility] [address], [place], [place] [address]  Glucose, capillary     Status: Abnormal   Collection Time: 03/28/18  5:39 AM  Result Value Ref Range  Glucose-Capillary 105 (H) 65 - 99 mg/dL  Prepare RBC     Status: None   Collection Time: 03/28/18 11:37 AM  Result Value Ref Range   Order Confirmation      ORDER PROCESSED BY BLOOD BANK Performed at [facility], [place] [address] [place], [place] [address]    Review of Systems  Constitutional: Positive for malaise/fatigue. Negative for chills, diaphoresis, fever and weight loss.  HENT: Negative.   Eyes: Negative.   Respiratory: Positive for shortness of breath.   Cardiovascular: Negative.   Gastrointestinal: Negative.   Genitourinary: Negative.   Musculoskeletal: Positive for joint pain.  Skin: Negative.   Neurological: Negative for tingling.  Endo/Heme/Allergies: Negative.   Psychiatric/Behavioral: The patient has insomnia.    Blood pressure (!) 141/74, pulse 63, temperature 98.6 F (37 C), temperature source Oral, resp. rate 19, height _0  (1.702 m), weight 90.5 kg (199 lb 8.3 oz), SpO2 100 %. Physical Exam  Constitutional: He is oriented to person, place, and time. He appears well-developed and well-nourished.  HENT:  Head: Normocephalic and atraumatic.  Eyes: Pupils are equal, round, and reactive to light. Conjunctivae and EOM are normal.  Neck: Normal range of motion. Neck supple.  Cardiovascular: Normal rate and regular rhythm.  Respiratory: Effort normal and breath sounds normal.  GI: Soft. Bowel sounds are normal.  Neurological: He is alert  and oriented to person, place, and time.  Skin: Skin is warm and dry.  Psychiatric: He has a normal mood and affect. His behavior is normal. Judgment and thought content normal.   Assessment/Plan: 1) Iron deficiency anemia: Will plan to do an enteroscopy tomorrow.  2) GERD/History of colonic polyps-not due for a colonoscopy till 10/2020.  3) HTN.  4) AODM/Diabetic neuropathy. Marland Kitchen5) PAD Haeley Fordham 03/28/2018, 2:57 PM

## 2018-03-28 NOTE — Progress Notes (Signed)
RN placed orders for H&H for 1:45 this morning. RN called lab around 3am and was told that the order was cancelled somehow. 2 RN's verified that the order states "active" although it was modified at one point. Lab tech called to take labs at this time. Will continue to monitor

## 2018-03-28 NOTE — Progress Notes (Addendum)
Family Medicine Teaching Service Daily Progress Note Intern Pager: [phone]  Patient name: [name] Medical record number: [id number] Date of birth: [birth year] Age: 67 y.o. Gender: male  Primary Care Provider: Rogue Bussing, MD Consultants: none Code Status: full  Pt Overview and Major Events to Date:  Admitted on 4/2, received 2 units RBCs 4/3 Hgb 7.5 after transfusion, received another 1 unit RBCs, GI consulted  Assessment and Plan:  [name] is a 67 y.o. male presenting with symptomatic anemia after Hgb found to be 5.7 in clinic. PMH is significant for obesity, well-controlled T2DM, HTN, HLD, PAD, diabetic neuropathyiron deficiency, tobacco use.   Severe iron deficiency anemia:uncertain etiology. Microcytic anemia has been noted multiple times in the past, and he was hospitalized in Sept 2016 for asymptomatic anemia without a cause identified at that time, although it was characterized as Fe deficiency anemia.  Colonoscopy 2013 with Dr. Collene Mares showed 4 tubular adenomas, 5 year follow up surveillance recommended; however, patient has yet gotten his repeat colonoscopy.  At risk for colon cancer due to adenomatous polyps in 2013 and mother with hx of colon cancer.  No known source of bleeding per patient.  Ferritin was significantly reduced at 5 on 03/26/18, which points to Fe deficiency anemia.  Vitamin B12 level was 300 on 03/26/18.  Most likely cause of anemia would be GI loss, but differential also includes thalassemia, RBC lysis, microscopic hematuria, nutritional, malignancy, or other marrow deficiency.  Will obtain further anemia studies to help elucidate the cause of his anemia.  Anemia panel indicates iron deficiency anemia with Fe 13, TIBC 554, saturation ratio 2.  Haptoglobin normal, pointing away from hemolysis. - transfuse one more unit RBCs today - patient declines rectal for FOBT, informed to let nurse know if he has a bowel movement so stool can be collected -  consult GI, follow up post-transfusion H/H - hold NSAIDs, ASA, plavix, no pharmacologic VTE prophylaxis - NPO at midnight for enteroscopy per Dr. Collene Mares  Peripheral arterial disease:Not classical claudication symptoms, but leg pain could be partly due to PAD worsened by anemia.  His legs are warm bilaterally with strong distal pulses. 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  - Continue home gabapentin and tylenol  T2DM: Hgb A1c 6.0%, managed with metformin only.  CBGs wnl since admission. - Continue home benazepril 5 mg daily - Continue gabapentin for neuropathy 1200 mg TID PRN - continue metformin since patient's creatinine is wnl - 1000 mg BID - daily CBGs  HTN: Continue beta blocker and ACE as above.   Tobacco TDD:UKGUR cigar use.  Will discuss cessation during admission.  - nicotine patch on request  GERD: On chronic protonix. - continue protonix for now - consider titrating down as outpatient - will add to d/c summary  FEN/GI: protonix  Prophylaxis: frequent ambulation - no pharmacologic VTE d/t bleeding; will hold SCDs d/t patient's significant PVD in legs  Disposition: home  Subjective:  Patient says he feels better this morning than he did yesterday.  He is amenable to receiving another blood transfusion today if needed.  Understands the importance of getting a colonoscopy to investigate his anemia.  No other complaints today.  Objective: Temp:  [97.5 F (36.4 C)-98.8 F (37.1 C)] 98.2 F (36.8 C) (04/03 0538) Pulse Rate:  [63-78] 63 (04/03  9147) Resp:  [15-23] 18 (04/03 0538) BP: (108-152)/(53-82) 140/82 (04/03 0538) SpO2:  [100 %] 100 % (04/03 0538) Weight:  [199 lb 8.3 oz (90.5 kg)] 199 lb 8.3 oz (90.5 kg) (04/02 1719) Physical Exam: General: sitting in  bedside chair, appears comfortable Cardiovascular: RRR, 2/6 murmur, diminished from 4/2 Respiratory: CTAB Abdomen: soft, nontender Extremities: moves all extremities spontaneously  Laboratory: Recent Labs  Lab 03/26/18 1548 03/27/18 1245 03/27/18 1637 03/28/18 0423  WBC 6.1 4.8 5.3  --   HGB 5.7* 5.4* 5.5* 7.5*  HCT 21.8* 21.3* 22.4* 27.5*  PLT  --  208 240  --    Recent Labs  Lab 03/27/18 1245 03/28/18 0423  NA 134* 134*  K 3.9 4.3  CL 100* 100*  CO2 23 23  BUN 6 9  CREATININE 1.01 0.94  CALCIUM 8.8* 8.6*  PROT  --  6.7  BILITOT  --  0.8  ALKPHOS  --  61  ALT  --  8*  AST  --  17  GLUCOSE 99 111*    Imaging/Diagnostic Tests: No results found.   Kathrene Alu, MD 03/28/2018, 8:12 AM PGY-1, [facility] Intern pager: [phone], text pages welcome

## 2018-03-29 ENCOUNTER — Inpatient Hospital Stay (HOSPITAL_COMMUNITY): Payer: Medicare Other | Admitting: Certified Registered Nurse Anesthetist

## 2018-03-29 ENCOUNTER — Encounter (HOSPITAL_COMMUNITY)
Admission: EM | Disposition: A | Discharge status: Home / Self Care | Payer: Self-pay | Source: Home / Self Care | Attending: Family Medicine

## 2018-03-29 DIAGNOSIS — K558 Other vascular disorders of intestine: Secondary | ICD-10-CM

## 2018-03-29 DIAGNOSIS — K5520 Angiodysplasia of colon without hemorrhage: Secondary | ICD-10-CM | POA: Insufficient documentation

## 2018-03-29 HISTORY — DX: Angiodysplasia of colon without hemorrhage: K55.20

## 2018-03-29 HISTORY — PX: HOT HEMOSTASIS: SHX5433

## 2018-03-29 HISTORY — DX: Other vascular disorders of intestine: K55.8

## 2018-03-29 HISTORY — PX: ENTEROSCOPY: SHX5533

## 2018-03-29 LAB — BPAM RBC
Blood Product Expiration Date: 201904062359
Blood Product Expiration Date: 201904222359
Blood Product Expiration Date: 201904252359
ISSUE DATE / TIME: 201904021718
ISSUE DATE / TIME: 201904022038
ISSUE DATE / TIME: 201904031239
Unit Type and Rh: 600
Unit Type and Rh: 6200
Unit Type and Rh: 6200

## 2018-03-29 LAB — TYPE AND SCREEN
ABO/RH(D): A POS
Antibody Screen: NEGATIVE
Unit division: 0
Unit division: 0
Unit division: 0

## 2018-03-29 LAB — CBC
HCT: 30.5 % — ABNORMAL LOW (ref 39.0–52.0)
HCT: 30.8 % — ABNORMAL LOW (ref 39.0–52.0)
Hemoglobin: 8.8 g/dL — ABNORMAL LOW (ref 13.0–17.0)
Hemoglobin: 8.8 g/dL — ABNORMAL LOW (ref 13.0–17.0)
MCH: 18.8 pg — ABNORMAL LOW (ref 26.0–34.0)
MCH: 18.6 pg — ABNORMAL LOW (ref 26.0–34.0)
MCHC: 28.9 g/dL — ABNORMAL LOW (ref 30.0–36.0)
MCHC: 28.6 g/dL — ABNORMAL LOW (ref 30.0–36.0)
MCV: 65.2 fL — ABNORMAL LOW (ref 78.0–100.0)
MCV: 65.1 fL — ABNORMAL LOW (ref 78.0–100.0)
Platelets: 189 10*3/uL (ref 150–400)
Platelets: 213 10*3/uL (ref 150–400)
RBC: 4.68 MIL/uL (ref 4.22–5.81)
RBC: 4.73 MIL/uL (ref 4.22–5.81)
RDW: 29.4 % — ABNORMAL HIGH (ref 11.5–15.5)
RDW: 29.4 % — ABNORMAL HIGH (ref 11.5–15.5)
WBC: 6.3 10*3/uL (ref 4.0–10.5)
WBC: 6.7 10*3/uL (ref 4.0–10.5)

## 2018-03-29 LAB — OCCULT BLOOD X 1 CARD TO LAB, STOOL: Fecal Occult Bld: NEGATIVE

## 2018-03-29 SURGERY — ENTEROSCOPY
Anesthesia: Monitor Anesthesia Care

## 2018-03-29 MED ORDER — SENNA 8.6 MG PO TABS
1.0000 | ORAL_TABLET | Freq: Every day | ORAL | Status: DC
  Administered 2018-03-29: 8.6 mg via ORAL
  Filled 2018-03-29: qty 1

## 2018-03-29 MED ORDER — PROPOFOL 500 MG/50ML IV EMUL
INTRAVENOUS | Status: DC | PRN
  Administered 2018-03-29: 150 ug/kg/min via INTRAVENOUS

## 2018-03-29 MED ORDER — SODIUM CHLORIDE 0.9 % IV SOLN: INTRAVENOUS | Status: DC

## 2018-03-29 MED ORDER — PHENYLEPHRINE HCL 10 MG/ML IJ SOLN
INTRAMUSCULAR | Status: DC | PRN
  Administered 2018-03-29 (×2): 80 ug via INTRAVENOUS

## 2018-03-29 MED ORDER — PROPOFOL 10 MG/ML IV BOLUS
INTRAVENOUS | Status: DC | PRN
  Administered 2018-03-29: 40 mg via INTRAVENOUS

## 2018-03-29 MED ORDER — LACTATED RINGERS IV SOLN
INTRAVENOUS | Status: DC | PRN
  Administered 2018-03-29: 13:00:00 via INTRAVENOUS

## 2018-03-29 NOTE — Interval H&P Note (Signed)
History and Physical Interval Note:  03/29/2018 12:52 PM  Jearld Adjutant  has presented today for surgery, with the diagnosis of IDA  The various methods of treatment have been discussed with the patient and family. After consideration of risks, benefits and other options for treatment, the patient has consented to  Procedure(s): ENTEROSCOPY (N/A) as a surgical intervention .  The patient's history has been reviewed, patient examined, no change in status, stable for surgery.  I have reviewed the patient's chart and labs.  Questions were answered to the patient's satisfaction.     Dastan Krider D

## 2018-03-29 NOTE — Progress Notes (Signed)
Family Medicine Teaching Service Daily Progress Note Intern Pager: [phone]  Patient name: [name] Medical record number: [id number] Date of birth: 12/11/1951 Age: 67 y.o. Gender: male  Primary Care Provider: Rogue Bussing, MD Consultants: none Code Status: full  Pt Overview and Major Events to Date:  Admitted on 4/2, received 2 units RBCs 4/3 Hgb 7.5 after transfusion, received another 1 unit RBCs, GI consulted  Assessment and Plan:  [name] is a 67 y.o. male presenting with symptomatic anemia after Hgb found to be 5.7 in clinic. PMH is significant for obesity, well-controlled T2DM, HTN, HLD, PAD, diabetic neuropathyiron deficiency, tobacco use.   Severe iron deficiency anemia:uncertain etiology. Microcytic anemia has been noted multiple times in the past, and he was hospitalized in Sept 2016 for asymptomatic anemia without a cause identified at that time, although it was characterized as Fe deficiency anemia. After discussing patient with his GI physician, Dr. 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  No known source of bleeding per patient.  Ferritin was significantly reduced at 5 on 03/26/18, which points to Fe deficiency anemia.  Vitamin B12 level was 300 on 03/26/18.  Most likely cause of anemia would be GI loss, but differential also includes thalassemia, RBC lysis, microscopic hematuria, nutritional, malignancy, or other marrow deficiency.  Will obtain further anemia studies to help elucidate the cause of his anemia.  Anemia panel indicates iron deficiency anemia with Fe 13, TIBC 554, saturation ratio 2.  Haptoglobin normal, pointing away from hemolysis. - after another unit of RBC on 4/3, hemoglobin 8.1; will recheck this am - patient declines rectal for FOBT, informed to let nurse know if he has a bowel movement so stool can be collected -  GI consult, appreciate recommendations - GI to perform enteroscopy today - hold NSAIDs, ASA, plavix, no pharmacologic VTE prophylaxis  Peripheral arterial disease:Not classical claudication symptoms, but leg pain could be partly due to PAD worsened by anemia.  His legs are warm bilaterally with strong distal pulses. 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  - Continue home gabapentin and tylenol  T2DM: Hgb A1c 6.0%, managed with metformin only.  CBGs wnl since admission. - Continue home benazepril 5 mg daily - Continue gabapentin for neuropathy 1200 mg TID PRN - continue metformin since patient's creatinine is wnl - 1000 mg BID - daily CBGs  HTN: Continue beta blocker and ACE as above.  Has been normotensive mostly, although hypertensive to 142/76 on 4/4 am.   Tobacco RKY:HCWCB cigar use.  Will discuss cessation during admission.  - nicotine patch on request  GERD: On chronic protonix. - continue protonix for now - consider titrating down as outpatient - will add to d/c summary  Constipation: Patient would like more stool softener since he has not had a bowel movement in a few days and usually has a BM daily.  Has been taking miralax 17 g daily. - add senokot QHS  FEN/GI: protonix  Prophylaxis: frequent ambulation - no pharmacologic VTE d/t bleeding; will hold SCDs d/t patient's significant PVD in legs  Disposition: home  Subjective:  Patient says he feels a little tired this morning, but otherwise has no concerns.  He is  glad to have an enteroscopy today.  Discussed the possibility of taking oral Fe at home with stool softeners, and he is amenable to this idea.  Has not had a bowel movement in a few days, but does not have abdominal pain; feels it is due to being a little  nervous.  Objective: Temp:  [98.1 F (36.7 C)-98.7 F (37.1 C)] 98.7 F (37.1 C) (04/04 0519) Pulse Rate:  [62-69] 63 (04/04 0519) Resp:  [12-19] 12 (04/04 0519) BP: (128-142)/(63-76) 142/76 (04/04 0519) SpO2:  [100 %] 100 % (04/04 0519) Physical Exam: General: sitting in bedside chair, appears comfortable Cardiovascular: RRR, 2/6 murmur Respiratory: CTAB Abdomen: soft, nontender Extremities: moves all extremities spontaneously  Laboratory: Recent Labs  Lab 03/27/18 1245 03/27/18 1637 03/28/18 0423 03/28/18 1836  WBC 4.8 5.3  --  6.0  HGB 5.4* 5.5* 7.5* 8.1*  HCT 21.3* 22.4* 27.5* 28.9*  PLT 208 240  --  185   Recent Labs  Lab 03/27/18 1245 03/28/18 0423  NA 134* 134*  K 3.9 4.3  CL 100* 100*  CO2 23 23  BUN 6 9  CREATININE 1.01 0.94  CALCIUM 8.8* 8.6*  PROT  --  6.7  BILITOT  --  0.8  ALKPHOS  --  61  ALT  --  8*  AST  --  17  GLUCOSE 99 111*    Imaging/Diagnostic Tests: No results found.   Kathrene Alu, MD 03/29/2018, 8:34 AM PGY-1, [place] Intern pager: [phone], text pages welcome

## 2018-03-29 NOTE — Transfer of Care (Signed)
Immediate Anesthesia Transfer of Care Note  Patient: [name]  Procedure(s) Performed: ENTEROSCOPY (N/A ) HOT HEMOSTASIS (ARGON PLASMA COAGULATION/BICAP) (N/A )  Patient Location: PACU and Endoscopy Unit  Anesthesia Type:MAC  Level of Consciousness: awake, alert , oriented and patient cooperative  Airway & Oxygen Therapy: Patient Spontanous Breathing and Patient connected to nasal cannula oxygen  Post-op Assessment: Report given to RN and Post -op Vital signs reviewed and stable  Post vital signs: Reviewed and stable  Last Vitals:  Vitals Value Taken Time  BP    Temp    Pulse    Resp 21 03/29/2018  1:51 PM  SpO2    Vitals shown include unvalidated device data.  Last Pain:  Vitals:   03/29/18 1221  TempSrc: Oral  PainSc: 0-No pain      Patients Stated Pain Goal: 3 (82/99/37 1696)  Complications: No apparent anesthesia complications

## 2018-03-29 NOTE — Op Note (Signed)
[facility] Patient Name: [name] Procedure Date : 03/29/2018 MRN: [id number] Attending MD: Carol Ada , MD Date of Birth: 06-Sep-1951 CSN: [id number] Age: 67 Admit Type: Inpatient Procedure:                Small bowel enteroscopy Indications:              Iron deficiency anemia Providers:                Carol Ada, MD, Kingsley Plan, RN, Elspeth Cho Tech., Technician, Tawni Carnes, CRNA Referring MD:              Medicines:                Propofol per Anesthesia Complications:            No immediate complications. Estimated Blood Loss:     Estimated blood loss: none. Procedure:                Pre-Anesthesia Assessment:                           - Prior to the procedure, a History and Physical                            was performed, and patient medications and                            allergies were reviewed. The patient's tolerance of                            previous anesthesia was also reviewed. The risks                            and benefits of the procedure and the sedation                            options and risks were discussed with the patient.                            All questions were answered, and informed consent                            was obtained. Prior Anticoagulants: The patient has                            taken no previous anticoagulant or antiplatelet                            agents. ASA Grade Assessment: III - A patient with                            severe systemic disease. After reviewing the risks  and benefits, the patient was deemed in                            satisfactory condition to undergo the procedure.                           - Sedation was administered by an anesthesia                            professional. Deep sedation was attained.                           After obtaining informed consent, the endoscope was                            passed  under direct vision. Throughout the                            procedure, the patient's blood pressure, pulse, and                            oxygen saturations were monitored continuously. The                            EC-[id number] ([id number]) scope was introduced through                            the mouth and advanced to the proximal jejunum. The                            small bowel enteroscopy was accomplished without                            difficulty. The patient tolerated the procedure                            well. Scope In: Scope Out: Findings:      The esophagus was normal.      The stomach was normal.      A tattoo was seen in the second portion of the duodenum. The tattoo site       appeared normal.      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 Coagulation for tissue destruction using monopolar       probe was successful. Estimated blood loss: none.      A few small nonbleeding AVMs were noted in the proximal jejunum. They       were ablated with APC. The second portion of the duodenum the prior       tattoo site was identified. Previously fresh blood was noted in the       area, but a clear source was not identified. Careful examination and       manipulation of the area 2-3 times did not induce any bleed. Impression:               - Normal esophagus.                           -  Normal stomach.                           - A tattoo was seen in the duodenum. The tattoo                            site appeared normal.                           - A few non-bleeding angiodysplastic lesions in the                            jejunum. Treated with a monopolar probe.                           - No specimens collected. Recommendation:           - Return patient to hospital ward for ongoing care.                           - Resume regular diet.                           - Follow HGB and transfuse as necessary.                            If HGB is stable tomorrow, he can be discharged                            home. 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 All rights reserved. The codes documented in this report are preliminary and upon coder review may  be revised to meet current compliance requirements. Carol Ada, MD  Carol Ada, MD 03/29/2018 1:49:26 PM This report has been signed electronically. Number of Addenda: 0

## 2018-03-29 NOTE — Anesthesia Preprocedure Evaluation (Signed)
Anesthesia Evaluation  Patient identified by MRN, date of birth, ID band Patient awake    Reviewed: Allergy & Precautions, H&P , NPO status , Patient's Chart, lab work & pertinent test results  Airway Mallampati: II   Neck ROM: full    Dental   Pulmonary shortness of breath, Current Smoker,    breath sounds clear to auscultation       Cardiovascular hypertension, + Peripheral Vascular Disease   Rhythm:regular Rate:Normal     Neuro/Psych PSYCHIATRIC DISORDERS Depression  Neuromuscular disease    GI/Hepatic GERD  ,  Endo/Other  diabetes, Type 2  Renal/GU      Musculoskeletal   Abdominal   Peds  Hematology  (+) anemia ,   Anesthesia Other Findings   Reproductive/Obstetrics                             Anesthesia Physical Anesthesia Plan  ASA: III  Anesthesia Plan: MAC   Post-op Pain Management:    Induction: Intravenous  PONV Risk Score and Plan: 0 and Propofol infusion and Treatment may vary due to age or medical condition  Airway Management Planned: Nasal Cannula  Additional Equipment:   Intra-op Plan:   Post-operative Plan:   Informed Consent: I have reviewed the patients History and Physical, chart, labs and discussed the procedure including the risks, benefits and alternatives for the proposed anesthesia with the patient or authorized representative who has indicated his/her understanding and acceptance.     Plan Discussed with: CRNA, Anesthesiologist and Surgeon  Anesthesia Plan Comments:         Anesthesia Quick Evaluation

## 2018-03-30 ENCOUNTER — Encounter (HOSPITAL_COMMUNITY): Payer: Self-pay | Admitting: Gastroenterology

## 2018-03-30 ENCOUNTER — Other Ambulatory Visit: Payer: Self-pay | Admitting: Internal Medicine

## 2018-03-30 LAB — CBC
HCT: 32.1 % — ABNORMAL LOW (ref 39.0–52.0)
Hemoglobin: 9.1 g/dL — ABNORMAL LOW (ref 13.0–17.0)
MCH: 18.6 pg — ABNORMAL LOW (ref 26.0–34.0)
MCHC: 28.3 g/dL — ABNORMAL LOW (ref 30.0–36.0)
MCV: 65.8 fL — ABNORMAL LOW (ref 78.0–100.0)
Platelets: 222 10*3/uL (ref 150–400)
RBC: 4.88 MIL/uL (ref 4.22–5.81)
RDW: 29.9 % — ABNORMAL HIGH (ref 11.5–15.5)
WBC: 6.5 10*3/uL (ref 4.0–10.5)

## 2018-03-30 LAB — GLUCOSE, CAPILLARY: Glucose-Capillary: 127 mg/dL — ABNORMAL HIGH (ref 65–99)

## 2018-03-30 MED ORDER — SENNA 8.6 MG PO TABS: 1.0000 | ORAL_TABLET | Freq: Every day | ORAL | 0 refills | Status: DC

## 2018-03-30 MED ORDER — POLYETHYLENE GLYCOL 3350 17 G PO PACK: 17.0000 g | PACK | Freq: Every day | ORAL | 0 refills | Status: DC

## 2018-03-30 MED ORDER — FERROUS SULFATE 325 (65 FE) MG PO TABS: 325.0000 mg | ORAL_TABLET | Freq: Every day | ORAL | 11 refills | Status: DC

## 2018-03-30 NOTE — Discharge Instructions (Signed)
Please take one iron pill daily with food.  Orange juice can help increase the absorption of iron.  Please take miralax and senna daily to help prevent constipation.  You and Dr. Ola Spurr can reassess your iron and bowel regimen on 04/05/18.

## 2018-03-30 NOTE — Care Management Important Message (Signed)
Important Message  Patient Details  Name: [name] MRN: [id number] Date of Birth: 12-24-1951   Medicare Important Message Given:  Yes    [name] 03/30/2018, 2:07 PM

## 2018-03-30 NOTE — Plan of Care (Signed)
Progressing

## 2018-03-30 NOTE — Anesthesia Postprocedure Evaluation (Signed)
Anesthesia Post Note  Patient: [name]  Procedure(s) Performed: ENTEROSCOPY (N/A ) HOT HEMOSTASIS (ARGON PLASMA COAGULATION/BICAP) (N/A )     Patient location during evaluation: PACU Anesthesia Type: MAC Level of consciousness: awake and alert Pain management: pain level controlled Vital Signs Assessment: post-procedure vital signs reviewed and stable Respiratory status: spontaneous breathing, nonlabored ventilation, respiratory function stable and patient connected to nasal cannula oxygen Cardiovascular status: stable and blood pressure returned to baseline Postop Assessment: no apparent nausea or vomiting Anesthetic complications: no    Last Vitals:  Vitals:   03/29/18 1510 03/29/18 2122  BP: 135/73 (!) 141/82  Pulse: (!) 57 69  Resp: 18 16  Temp: 36.8 C 37 C  SpO2: 100% 100%    Last Pain:  Vitals:   03/29/18 2211  TempSrc:   PainSc: [place]

## 2018-03-30 NOTE — Care Management Important Message (Signed)
Important Message  Patient Details  Name: [name] MRN: [id number] Date of Birth: [birth year]   Medicare Important Message Given:  Yes    [name] 03/30/2018, 1:59 PM

## 2018-03-30 NOTE — Progress Notes (Signed)
Family Medicine Teaching Service Daily Progress Note Intern Pager: [phone]  Patient name: [name] Medical record number: [id number] Date of birth: [birth year] Age: 67 y.o. Gender: male  Primary Care Provider: Rogue Bussing, MD Consultants: none Code Status: full  Pt Overview and Major Events to Date:  Admitted on 4/2, received 2 units RBCs 4/3 Hgb 7.5 after transfusion, received another 1 unit RBCs, GI consulted  Assessment and Plan:  [name] is a 67 y.o. male presenting with symptomatic anemia after Hgb found to be 5.7 in clinic. PMH is significant for obesity, well-controlled T2DM, HTN, HLD, PAD, diabetic neuropathyiron deficiency, tobacco use.   Severe iron deficiency anemia:uncertain etiology.  Enteroscopy visualized non-bleeding angiodysplastic lesions with some fresh blood without a clear source of this blood.  Hemoglobin stabilized to 9.1 today, above transfusion threshold of 8.0.  GI recommends d/c home today. - GI consult, appreciate recommendations - hold NSAIDs, ASA, plavix, no pharmacologic VTE prophylaxis - start oral Fe regimen with stool softener - follow up outpatient in one week with CBC  Peripheral arterial disease:Stable. - continue home lipitor 40 mg daily, nebivolol 20 mg daily - no need to restart Plavix whenever anemia resolves; can restart ASA at that time, consider restarting outpatient  Peripheral neuropathy:Bilateral, chronic, likely combination of ischemic and diabetic neuropathy.  - Continue home gabapentin and tylenol  T2DM: Hgb A1c 6.0%, managed with metformin only.  CBGs wnl since admission. - Continue home benazepril 5 mg daily - Continue gabapentin for neuropathy 1200 mg TID PRN - continue metformin since patient's creatinine is wnl - 1000 mg BID - daily CBGs  HTN: Continue beta blocker and ACE as above.  Has been normotensive mostly, although hypertensive to 142/76 on 4/4 am.   Tobacco AOZ:HYQMV cigar use.   Will discuss cessation during admission.  - nicotine patch on request  GERD: On chronic protonix. - continue protonix for now - consider titrating down as outpatient - will add to d/c summary  Constipation: Patient would like more stool softener since he has not had a bowel movement in a few days and usually has a BM daily.  Has been taking miralax 17 g daily. - senokot QHS  FEN/GI: protonix  Prophylaxis: frequent ambulation - no pharmacologic VTE d/t bleeding; will hold SCDs d/t patient's significant PVD in legs  Disposition: home today  Subjective:  Patient says he feels better this morning and is relieved that his hemoglobin is more normal.  He is amenable to starting oral iron supplementation at home.  Objective: Temp:  [98.2 F (36.8 C)-98.6 F (37 C)] 98.6 F (37 C) (04/04 2122) Pulse Rate:  [57-69] 69 (04/04 2122) Resp:  [15-21] 16 (04/04 2122) BP: (95-208)/(38-91) 141/82 (04/04 2122) SpO2:  [94 %-100 %] 100 % (04/04 2122) Weight:  [199 lb (90.3 kg)] 199 lb (90.3 kg) (04/04 1221) Physical Exam: General: sitting in bedside chair, appears comfortable Cardiovascular: RRR, 1/6 murmur Respiratory: CTAB Abdomen: soft, nontender Extremities: moves all extremities spontaneously  Laboratory: Recent Labs  Lab 03/28/18 1836 03/29/18 0842 03/29/18 2043  WBC 6.0 6.3 6.7  HGB 8.1* 8.8* 8.8*  HCT 28.9* 30.5* 30.8*  PLT 185 189 213   Recent Labs  Lab 03/27/18 1245 03/28/18 0423  NA 134* 134*  K 3.9 4.3  CL 100* 100*  CO2 23 23  BUN 6 9  CREATININE 1.01 0.94  CALCIUM 8.8* 8.6*  PROT  --  6.7  BILITOT  --  0.8  ALKPHOS  --  61  ALT  --  8*  AST  --  17  GLUCOSE 99 111*    Imaging/Diagnostic Tests: No results found.   Brian Alu, MD 03/30/2018, 7:12 AM PGY-1, [place] Intern pager: [phone], text pages welcome

## 2018-03-30 NOTE — Discharge Summary (Signed)
[facility] Discharge Summary  Patient name: [name] Medical record number: [id number] Date of birth: 02/11/1951 Age: 67 y.o. Gender: male Date of Admission: 03/27/2018  Date of Discharge: 03/30/18 Admitting Physician: Kathrene Alu, MD  Primary Care Provider: Rogue Bussing, MD Consultants: GI  Indication for Hospitalization: symptomatic anemia  Discharge Diagnoses/Problem List:  Iron deficiency anemia PAD Peripheral neuropathy T2DM HTN Tobacco use GERD  Disposition: home  Discharge Condition: stable, improved  Discharge Exam:  General: sitting in bedside chair, appears comfortable Cardiovascular: RRR, 1/6 murmur Respiratory: CTAB Abdomen: soft, nontender Extremities: moves all extremities spontaneously  Brief Hospital Course:  Mr. Garverick was admitted on 4/2 after hemoglobin was found to be 5.7 at his clinic visit on 4/1.  In the ED, his hemoglobin was 5.4.  Labs were consistent with iron deficiency anemia.  He was transfused a total of three units RBCs with a transfusion threshold of 8.0 due to his peripheral artery disease with likely concurrent CAD.  GI was consulted, and they performed an enteroscopy since the patient had gotten a colonoscopy in 2016 that was largely negative.  Enteroscopy was significant for angiodysplasia with no active bleeding noted, although fresh blood was visualized in the small intestine.  GI cleared him for discharge.  On 4/5 he was discharged with a hemoglobin of 9.1 and a prescription for oral Fe therapy with stool softeners.  Advised to follow up with PCP in about one week to check his hemoglobin and Fe regimen.  Issues for Follow Up:  1. Check hemoglobin 2. Discuss how patient is tolerating Fe therapy, since it has caused constipation and resulting nonadherence in the past  Significant Procedures: enteroscopy  Significant Labs and Imaging:  Recent Labs  Lab 03/29/18 0842 03/29/18 2043  03/30/18 0624  WBC 6.3 6.7 6.5  HGB 8.8* 8.8* 9.1*  HCT 30.5* 30.8* 32.1*  PLT 189 213 222   Recent Labs  Lab 03/27/18 1245 03/28/18 0423  NA 134* 134*  K 3.9 4.3  CL 100* 100*  CO2 23 23  GLUCOSE 99 111*  BUN 6 9  CREATININE 1.01 0.94  CALCIUM 8.8* 8.6*  ALKPHOS  --  61  AST  --  17  ALT  --  8*  ALBUMIN  --  3.5    Results/Tests Pending at Time of Discharge: none  Discharge Medications:  Allergies as of 03/30/2018      Reactions   Glipizide Other (See Comments)   REACTION IS SIDE EFFECT Severe hypoglycemia to 40s.       Medication List    STOP taking these medications   aspirin EC 81 MG tablet   clopidogrel 75 MG tablet Commonly known as:  PLAVIX     TAKE these medications   acetaminophen 500 MG tablet Commonly known as:  TYLENOL Take 1,000 mg by mouth 3 (three) times daily as needed for moderate pain.   atorvastatin 40 MG tablet Commonly known as:  LIPITOR Take 1 tablet (40 mg total) by mouth daily.   benazepril 5 MG tablet Commonly known as:  LOTENSIN Take 1 tablet (5 mg total) by mouth daily.   BYSTOLIC 20 MG Tabs Generic drug:  Nebivolol HCl TAKE 1 TABLET BY MOUTH EVERY DAY   cyclobenzaprine 10 MG tablet Commonly known as:  FLEXERIL TAKE ONE  1  TABLET BY MOUTH THREE TIMES DAILY AS NEEDED FOR MUSCLE SPASMS What changed:    how much to take  how to take this  when  to take this  reasons to take this  additional instructions   dextromethorphan-guaiFENesin 30-600 MG 12hr tablet Commonly known as:  MUCINEX DM Take 1 tablet by mouth 2 (two) times daily as needed for cough.   docusate sodium 100 MG capsule Commonly known as:  COLACE Take 1 capsule (100 mg total) by mouth 2 (two) times daily.   ezetimibe 10 MG tablet Commonly known as:  ZETIA Take 1 tablet (10 mg total) by mouth daily.   ferrous sulfate 325 (65 FE) MG tablet Take 1 tablet (325 mg total) by mouth daily.   FLUoxetine 10 MG capsule Commonly known as:  PROZAC Take 1  capsule (10 mg total) by mouth daily.   gabapentin 800 MG tablet Commonly known as:  NEURONTIN TAKE 1.5 TABLETS (1,200 MG TOTAL) BY MOUTH 3 (THREE) TIMES DAILY.   metFORMIN 1000 MG tablet Commonly known as:  GLUCOPHAGE Take 1 tablet (1,000 mg total) by mouth 2 (two) times daily with a meal.   MUCINEX FAST-MAX CONGEST COUGH 2.5-5-100 MG/5ML Liqd Generic drug:  Phenylephrine-DM-GG Take 30 mLs by mouth every 12 (twelve) hours as needed (cough).   pantoprazole 40 MG tablet Commonly known as:  PROTONIX Take 1 tablet (40 mg total) by mouth daily.   polyethylene glycol packet Commonly known as:  MIRALAX / GLYCOLAX Take 17 g by mouth daily. Start taking on:  03/31/2018   senna 8.6 MG Tabs tablet Commonly known as:  SENOKOT Take 1 tablet (8.6 mg total) by mouth at bedtime.       Discharge Instructions: Please refer to Patient Instructions section of EMR for full details.  Patient was counseled important signs and symptoms that should prompt return to medical care, changes in medications, dietary instructions, activity restrictions, and follow up appointments.   Follow-Up Appointments: Follow-up Information    Rogue Bussing, MD Follow up on 04/05/2018.   Specialty:  Family Medicine Why:  Please arrive 15 minutes before your 9:30 AM appointment. Contact information: [place] [address] [phone]           Kathrene Alu, MD 03/30/2018, 8:32 PM PGY-1, [place]

## 2018-04-02 NOTE — Consult Note (Signed)
            [facility] CM Primary Care Navigator  04/02/2018  TREYVEON MOCHIZUKI 21-Jul-1951 [id number]   Attempt toseepatient at the bedside to identify possible discharge needsbuthe was already dischargedhome over the weekend.  Patient was admitted for symptomatic anemia (consistent with iron deficiency) requiring blood transfusions.  Primary care provider's officeis listed as providingtransition of care (TOC).   Patient has discharge instruction to follow-up withprimary care provider on 04/05/18.   For additional questions please contact:  Edwena Felty A. Erandy Mceachern, BSN, RN-BC [facility] PRIMARY CARE Navigator Cell: [phone]

## 2018-04-04 NOTE — ED Provider Notes (Addendum)
[name] PROGRESSIVE CARE Provider Note   CSN: [id number] Arrival date & time: 03/27/18  1227     History   Chief Complaint Chief Complaint  Patient presents with  . Dizziness  . Abnormal Lab    HPI [name] is a 67 y.o. male.  HPI Patient presents to the emergency department with shortness of breath dizziness and low hemoglobin.  The patient states he was called by his doctor's office who stated that his hemoglobin was low and he did come to the hospital.  The patient states that the symptoms have been ongoing over the last several weeks but got worse over the last 3-4 days.  The patient states that nothing seems to make the condition better but activity seems to make his condition worse.  The patient denies chest pain,  headache,blurred vision, neck pain, fever, cough, weakness, numbness,  anorexia, edema, abdominal pain, nausea, vomiting, diarrhea, rash, back pain, dysuria, hematemesis, bloody stool, near syncope, or syncope. Past Medical History:  Diagnosis Date  . Anemia   . Arthritis    OA  . Cervical radiculopathy    Dr. Vertell Limber neurosurgery  . Chronic lower back pain   . Diabetes mellitus    takes Metformin daily  . GERD (gastroesophageal reflux disease)    takes Protonix daily  . History of blood transfusion    "related to low HgB" ((09/10/2015  . Hyperlipidemia    takes Vytorin daily  . Hypertension    takes Benazepril and Bystolic daily  . PAD (peripheral artery disease) ([place])   . Pneumonia   . Shortness of breath dyspnea    with exertion  . Tobacco user   . Toe fracture, right 05/09/2011    Patient Active Problem List   Diagnosis Date Noted  . Pain in both lower extremities 03/27/2018  . Symptomatic anemia 03/27/2018  . Pain of right lower extremity 12/10/2017  . Radiculopathy, cervical region 02/11/2017  . Depression 10/03/2016  . Back pain 06/24/2016  . Vision changes 02/02/2016  . Claudication of both lower extremities ([place]) 07/15/2015    . Proteinuria 08/27/2014  . Diabetic neuropathy ([place]) 02/27/2014  . Iron deficiency anemia 08/01/2012  . Type 2 diabetes mellitus without complication ([place]) 42/70/6237  . GERD 01/14/2009  . HYPERCHOLESTEROLEMIA 02/22/2007  . OBESITY, NOS 02/22/2007  . Tobacco abuse 02/22/2007  . HYPERTENSION, BENIGN SYSTEMIC 02/22/2007  . PAD (peripheral artery disease) ([place]) 02/22/2007  . OSTEOARTHRITIS, MULTI SITES 02/22/2007    Past Surgical History:  Procedure Laterality Date  . ABDOMINAL AORTOGRAM W/LOWER EXTREMITY N/A 12/11/2017   Procedure: ABDOMINAL AORTOGRAM W/LOWER EXTREMITY;  Surgeon: Angelia Mould, MD;  Location: [facility];  Service: Cardiovascular;  Laterality: N/A;  . ANTERIOR CERVICAL DECOMP/DISCECTOMY FUSION  03/08/12   C6-7  . ANTERIOR CERVICAL DECOMP/DISCECTOMY FUSION  03/08/2012   Procedure: ANTERIOR CERVICAL DECOMPRESSION/DISCECTOMY FUSION 1 LEVEL/HARDWARE REMOVAL;  Surgeon: Erline Levine, MD;  Location: [facility] OR[facility];  Service: Neurosurgery;  Laterality: N/A;  revison of C5-7 anterior cervical decompression with fusion with Cervical Five-Thoracic One anterior cervical decompression with fusion with interbody prothesis plating and bonegraft  . BACK SURGERY  1996  . BYPASS GRAFT FEMORAL-PERONEAL Left 11/11/2016   Procedure: REDO LEFT FEMORAL-PERONEAL BYPASS WITH PROPATEN 6MM X 80CM GRAFT;  Surgeon: Angelia Mould, MD;  Location: [facility] OR;  Service: Vascular;  Laterality: Left;  . COLONOSCOPY W/ BIOPSIES AND POLYPECTOMY  08/17/2012   f/u 5 years, 4 polyps, no high grade dysplasia or malignancy, tubular adenoma,  hyperplastic polyops  . ENTEROSCOPY N/A 12/11/2015   Procedure: ENTEROSCOPY;  Surgeon: Carol Ada, MD;  Location: [facility] ENDOSCOPY;  Service: Endoscopy;  Laterality: N/A;  . ENTEROSCOPY N/A 03/29/2018   Procedure: ENTEROSCOPY;  Surgeon: Carol Ada, MD;  Location: [facility] ENDOSCOPY;  Service: Endoscopy;  Laterality: N/A;  . ESOPHAGOGASTRODUODENOSCOPY  08/17/2012    normal esophagus and GEJ, diffuse gastritis with erythema- no malignancy, reactive gastropathy  with focal intestinal metaplasia  . FEMORAL-POPLITEAL BYPASS GRAFT Left 01/06/2016   Procedure: Left  COMMON FEMORAL-BELOW KNEE POPLITEAL ARTERY Bypass using non-reversed translocated saphenous vein graft from left leg;  Surgeon: Mal Misty, MD;  Location: [place] [facility];  Service: Vascular;  Laterality: Left;  . GIVENS CAPSULE STUDY N/A 11/24/2015   Procedure: GIVENS CAPSULE STUDY;  Surgeon: Juanita Craver, MD;  Location: [facility];  Service: Endoscopy;  Laterality: N/A;  . HOT HEMOSTASIS N/A 03/29/2018   Procedure: HOT HEMOSTASIS (ARGON PLASMA COAGULATION/BICAP);  Surgeon: Carol Ada, MD;  Location: [facility] [place];  Service: Endoscopy;  Laterality: N/A;  . INGUINAL HERNIA REPAIR  1990's   right  . INTRAOPERATIVE ARTERIOGRAM Left 01/06/2016   Procedure: INTRA OPERATIVE ARTERIOGRAM LEFT LOWER LEG;  Surgeon: Mal Misty, MD;  Location: [facility];  Service: Vascular;  Laterality: Left;  . INTRAOPERATIVE ARTERIOGRAM Left 11/11/2016   Procedure: INTRA OPERATIVE ARTERIOGRAM LEFT LOWER EXTRIMITY;  Surgeon: Angelia Mould, MD;  Location: [place];  Service: Vascular;  Laterality: Left;  . IR ANGIOGRAM FOLLOW UP STUDY  12/11/2017  . IR GENERIC HISTORICAL  10/24/2016   IR ANGIOGRAM FOLLOW UP STUDY  . LOWER EXTREMITY ANGIOGRAM Bilateral 07/30/2015   Procedure: Lower Extremity Angiogram;  Surgeon: Conrad Potlatch, MD;  Location: [facility] LAB;  Service: Cardiovascular;  Laterality: Bilateral;  . [place]   "lower"  . PERIPHERAL VASCULAR CATHETERIZATION N/A 07/30/2015   Procedure: Abdominal Aortogram;  Surgeon: Conrad , MD;  Location: [facility];  Service: Cardiovascular;  Laterality: N/A;  . PERIPHERAL VASCULAR CATHETERIZATION N/A 10/24/2016   Procedure: Abdominal Aortogram;  Surgeon: Angelia Mould, MD;  Location: [facility];  Service: Cardiovascular;  Laterality: N/A;    . PERIPHERAL VASCULAR CATHETERIZATION N/A 10/24/2016   Procedure: Lower Extremity Angiography;  Surgeon: Angelia Mould, MD;  Location: [facility];  Service: Cardiovascular;  Laterality: N/A;  . PERIPHERAL VASCULAR INTERVENTION Right 12/11/2017   Procedure: PERIPHERAL VASCULAR INTERVENTION;  Surgeon: Angelia Mould, MD;  Location: [facility];  Service: Cardiovascular;  Laterality: Right;  Marland Kitchen VASCULAR SURGERY  ~ 2007   Stent SFA   . VEIN HARVEST Left 01/06/2016   Procedure: LEFT GREATER SAPPHENOUS VEIN HARVEST;  Surgeon: Mal Misty, MD;  Location: [facility];  Service: Vascular;  Laterality: Left;        Home Medications    Prior to Admission medications   Medication Sig Start Date End Date Taking? Authorizing Provider  acetaminophen (TYLENOL) 500 MG tablet Take 1,000 mg by mouth 3 (three) times daily as needed for moderate pain.   Yes [provider]  atorvastatin (LIPITOR) 40 MG tablet Take 1 tablet (40 mg total) by mouth daily. 02/06/18  Yes Rogue Bussing, MD  benazepril (LOTENSIN) 5 MG tablet Take 1 tablet (5 mg total) by mouth daily. 12/10/17  Yes Rogue Bussing, MD  BYSTOLIC 20 MG TABS TAKE 1 TABLET BY MOUTH EVERY DAY 01/02/18  Yes Rogue Bussing, MD  cyclobenzaprine (FLEXERIL) 10 MG tablet TAKE  ONE  1  TABLET BY MOUTH THREE TIMES DAILY AS NEEDED FOR MUSCLE SPASMS Patient taking differently: Take 10 mg by mouth 3 (three) times daily as needed for muscle spasms. TAKE ONE  1  TABLET BY MOUTH THREE TIMES DAILY AS NEEDED FOR MUSCLE SPASMS 10/31/17  Yes Rogue Bussing, MD  dextromethorphan-guaiFENesin [facility] DM) 30-600 MG 12hr tablet Take 1 tablet by mouth 2 (two) times daily as needed for cough. 04/01/17  Yes Leo Grosser, MD  ezetimibe (ZETIA) 10 MG tablet Take 1 tablet (10 mg total) by mouth daily. 12/10/17  Yes Rogue Bussing, MD  FLUoxetine (PROZAC) 10 MG capsule Take 1 capsule (10 mg total) by mouth  daily. 12/10/17  Yes Rogue Bussing, MD  metFORMIN (GLUCOPHAGE) 1000 MG tablet Take 1 tablet (1,000 mg total) by mouth 2 (two) times daily with a meal. 12/10/17  Yes Rogue Bussing, MD  pantoprazole (PROTONIX) 40 MG tablet Take 1 tablet (40 mg total) by mouth daily. 12/10/17  Yes Fitzgerald, Sharman Cheek, MD  Phenylephrine-DM-GG (MUCINEX FAST-MAX CONGEST COUGH) 2.5-5-100 MG/5ML LIQD Take 30 mLs by mouth every 12 (twelve) hours as needed (cough).   Yes [provider]  docusate sodium (COLACE) 100 MG capsule Take 1 capsule (100 mg total) by mouth 2 (two) times daily. Patient not taking: Reported on 03/27/2018 12/08/16   McKeag, Marylynn Pearson, MD  ferrous sulfate 325 (65 FE) MG tablet Take 1 tablet (325 mg total) by mouth daily. 03/30/18 03/25/19  Kathrene Alu, MD  gabapentin (NEURONTIN) 800 MG tablet TAKE 1.5 TABLETS (1,200 MG TOTAL) BY MOUTH 3 (THREE) TIMES DAILY. Patient not taking: Reported on 03/27/2018 11/16/16   McKeag, Marylynn Pearson, MD  polyethylene glycol [facility] / Floria Raveling) packet Take 17 g by mouth daily. 03/31/18   Everrett Coombe, MD  senna (SENOKOT) 8.6 MG TABS tablet Take 1 tablet (8.6 mg total) by mouth at bedtime. 03/30/18   Everrett Coombe, MD    Family History Family History  Problem Relation Age of Onset  . Cancer Mother        colon Cancer  . Hyperlipidemia Mother   . Diabetes Mother   . Heart disease Father   . Hypertension Father   . Cancer Sister        Uterine  . Diabetes Sister   . Diabetes Brother     Social History Social History   Tobacco Use  . Smoking status: Current Some Day Smoker    Years: 24.00    Types: Cigars  . Smokeless tobacco: Never Used  . Tobacco comment: smokes 1-2 cigars per day   Substance Use Topics  . Alcohol use: Yes    Alcohol/week: 1.2 oz    Types: 2 Cans of beer per week  . Drug use: No     Allergies   Glipizide   Review of Systems Review of Systems All other systems negative except as documented in the HPI. All  pertinent positives and negatives as reviewed in the HPI.  Physical Exam Updated Vital Signs BP 129/67 (BP Location: Left Arm)   Pulse 66   Temp 97.6 F (36.4 C) (Oral)   Resp 16   Ht 5\' 7"  (1.702 m)   Wt 90.3 kg (199 lb)   SpO2 100%   BMI 31.17 kg/m   Physical Exam  Constitutional: He is oriented to person, place, and time. He appears well-developed and well-nourished. No distress.  HENT:  Head: Normocephalic and atraumatic.  Mouth/Throat: Oropharynx is clear and moist.  Eyes: Pupils are equal, round, and reactive to light.  Neck: Normal range of motion. Neck supple.  Cardiovascular: Normal rate, regular rhythm and normal heart sounds. Exam reveals no gallop and no friction rub.  No murmur heard. Pulmonary/Chest: Effort normal and breath sounds normal. No respiratory distress. He has no wheezes.  Abdominal: Soft. Bowel sounds are normal. He exhibits no distension. There is no tenderness.  Neurological: He is alert and oriented to person, place, and time. He exhibits normal muscle tone. Coordination normal.  Skin: Skin is warm and dry. Capillary refill takes less than 2 seconds. No rash noted. No erythema.  Psychiatric: He has a normal mood and affect. His behavior is normal.  Nursing note and vitals reviewed.    ED Treatments / Results  Labs (all labs ordered are listed, but only abnormal results are displayed) Labs Reviewed  BASIC METABOLIC PANEL - Abnormal; Notable for the following components:      Result Value   Sodium 134 (*)    Chloride 100 (*)    Calcium 8.8 (*)    All other components within normal limits  CBC - Abnormal; Notable for the following components:   RBC 3.68 (*)    Hemoglobin 5.4 (*)    HCT 21.3 (*)    MCV 57.9 (*)    MCH 14.7 (*)    MCHC 25.4 (*)    RDW 23.7 (*)    All other components within normal limits  IRON AND TIBC - Abnormal; Notable for the following components:   Iron 13 (*)    TIBC 554 (*)    Saturation Ratios 2 (*)    All other  components within normal limits  RETICULOCYTES - Abnormal; Notable for the following components:   RBC. 3.86 (*)    All other components within normal limits  CBC WITH DIFFERENTIAL/PLATELET - Abnormal; Notable for the following components:   RBC 3.86 (*)    Hemoglobin 5.5 (*)    HCT 22.4 (*)    MCV 58.0 (*)    MCH 14.2 (*)    MCHC 24.6 (*)    RDW 23.6 (*)    All other components within normal limits  COMPREHENSIVE METABOLIC PANEL - Abnormal; Notable for the following components:   Sodium 134 (*)    Chloride 100 (*)    Glucose, Bld 111 (*)    Calcium 8.6 (*)    ALT 8 (*)    All other components within normal limits  HEMOGLOBIN AND HEMATOCRIT, BLOOD - Abnormal; Notable for the following components:   Hemoglobin 7.5 (*)    HCT 27.5 (*)    All other components within normal limits  GLUCOSE, CAPILLARY - Abnormal; Notable for the following components:   Glucose-Capillary 105 (*)    All other components within normal limits  CBC - Abnormal; Notable for the following components:   Hemoglobin 8.1 (*)    HCT 28.9 (*)    MCV 65.1 (*)    MCH 18.2 (*)    MCHC 28.0 (*)    RDW 28.7 (*)    All other components within normal limits  CBC - Abnormal; Notable for the following components:   Hemoglobin 8.8 (*)    HCT 30.5 (*)    MCV 65.2 (*)    MCH 18.8 (*)    MCHC 28.9 (*)    RDW 29.4 (*)    All other components within normal limits  CBC - Abnormal; Notable for the following components:   Hemoglobin 9.1 (*)  HCT 32.1 (*)    MCV 65.8 (*)    MCH 18.6 (*)    MCHC 28.3 (*)    RDW 29.9 (*)    All other components within normal limits  CBC - Abnormal; Notable for the following components:   Hemoglobin 8.8 (*)    HCT 30.8 (*)    MCV 65.1 (*)    MCH 18.6 (*)    MCHC 28.6 (*)    RDW 29.4 (*)    All other components within normal limits  GLUCOSE, CAPILLARY - Abnormal; Notable for the following components:   Glucose-Capillary 127 (*)    All other components within normal limits    FOLATE  HAPTOGLOBIN  OCCULT BLOOD X 1 CARD TO LAB, STOOL  TYPE AND SCREEN  PREPARE RBC (CROSSMATCH)  PREPARE RBC (CROSSMATCH)    EKG EKG Interpretation  Date/Time:  Tuesday March 27 2018 12:44:47 EDT Ventricular Rate:  75 PR Interval:  166 QRS Duration: 90 QT Interval:  430 QTC Calculation: 480 R Axis:   -14 Text Interpretation:  Normal sinus rhythm Moderate voltage criteria for LVH, may be normal variant Prolonged QT Abnormal ECG QT prolonged as compared to prior EKG Confirmed by Malvin Johns [id number]) on 03/27/2018 1:59:07 PM   Radiology No results found.  Procedures Procedures (including critical care time)  Medications Ordered in ED Medications  0.9 %  sodium chloride infusion ( Intravenous Stopped 03/28/18 1247)     Initial Impression / Assessment and Plan / ED Course  I have reviewed the triage vital signs and the nursing notes.  Pertinent labs & imaging results that were available during my care of the patient were reviewed by me and considered in my medical decision making (see chart for details).    CRITICAL CARE Performed by: Resa Miner Anis Cinelli Total critical care time: 30 minutes Critical care time was exclusive of separately billable procedures and treating other patients. Critical care was necessary to treat or prevent imminent or life-threatening deterioration. Critical care was time spent personally by me on the following activities: development of treatment plan with patient and/or surrogate as well as nursing, discussions with consultants, evaluation of patient's response to treatment, examination of patient, obtaining history from patient or surrogate, ordering and performing treatments and interventions, ordering and review of laboratory studies, ordering and review of radiographic studies, pulse oximetry and re-evaluation of patient's condition. Patient received IV packed red blood cells and he is symptomatic with his hemoglobin of 6.  Patient be  admitted to the hospital for symptomatic anemia by the family medicine residents.  Patient has remained stable here in the ER  Final Clinical Impressions(s) / ED Diagnoses   Final diagnoses:  None    ED Discharge Orders        Ordered    polyethylene glycol (MIRALAX / GLYCOLAX) packet  Daily     03/30/18 1315    senna (SENOKOT) 8.6 MG TABS tablet  Daily at bedtime     03/30/18 1315    ferrous sulfate 325 (65 FE) MG tablet  Daily     03/30/18 [address] PA-C 04/07/18 0631    Malvin Johns, MD 04/10/18 1424    Abdulahad Mederos, Harrell Gave, PA-C 04/26/18 1114    Malvin Johns, MD 04/27/18 1605

## 2018-04-05 ENCOUNTER — Ambulatory Visit (INDEPENDENT_AMBULATORY_CARE_PROVIDER_SITE_OTHER): Payer: Medicare Other | Admitting: Internal Medicine

## 2018-04-05 ENCOUNTER — Encounter: Payer: Self-pay | Admitting: Internal Medicine

## 2018-04-05 ENCOUNTER — Other Ambulatory Visit: Payer: Self-pay

## 2018-04-05 VITALS — BP 136/72 | HR 71 | Temp 98.2°F | Ht 67.0 in | Wt 203.8 lb

## 2018-04-05 DIAGNOSIS — I739 Peripheral vascular disease, unspecified: Secondary | ICD-10-CM

## 2018-04-05 DIAGNOSIS — D649 Anemia, unspecified: Secondary | ICD-10-CM

## 2018-04-05 DIAGNOSIS — Z09 Encounter for follow-up examination after completed treatment for conditions other than malignant neoplasm: Secondary | ICD-10-CM

## 2018-04-05 NOTE — Patient Instructions (Signed)
[name],  [name] can take gabapentin 800 mg three times daily.  Continue taking the iron daily. Taking this with some vitamin C (like orange juice) and not with a meal can help your body absorb this better.  I will call to let you know if it is safe to restart the aspirin and plavix.  I will refer to an exercise program.  Best, Dr. Ola Spurr

## 2018-04-06 ENCOUNTER — Encounter: Payer: Self-pay | Admitting: Internal Medicine

## 2018-04-06 ENCOUNTER — Telehealth: Payer: Self-pay | Admitting: Internal Medicine

## 2018-04-06 LAB — CBC
Hematocrit: 32 % — ABNORMAL LOW (ref 37.5–51.0)
Hemoglobin: 9.2 g/dL — ABNORMAL LOW (ref 13.0–17.7)
MCH: 18.5 pg — ABNORMAL LOW (ref 26.6–33.0)
MCHC: 28.8 g/dL — ABNORMAL LOW (ref 31.5–35.7)
MCV: 65 fL — ABNORMAL LOW (ref 79–97)
Platelets: 316 10*3/uL (ref 150–379)
RBC: 4.96 x10E6/uL (ref 4.14–5.80)
RDW: 30.3 % — ABNORMAL HIGH (ref 12.3–15.4)
WBC: 5.5 10*3/uL (ref 3.4–10.8)

## 2018-04-06 MED ORDER — VITAMIN D (ERGOCALCIFEROL) 1.25 MG (50000 UNIT) PO CAPS: ORAL_CAPSULE | ORAL | 0 refills | Status: DC

## 2018-04-06 MED ORDER — CLOPIDOGREL BISULFATE 75 MG PO TABS: 75.0000 mg | ORAL_TABLET | Freq: Every day | ORAL | 1 refills | Status: DC

## 2018-04-06 NOTE — Assessment & Plan Note (Signed)
>>  ASSESSMENT AND PLAN FOR ANEMIA WRITTEN ON 04/06/2018  3:35 PM BY FITZGERALD, Chrisandra Netters, MD  - Asymptomatic. Tolerating iron supplementation.  - Pending stable repeat hgb on CBC, will discuss restarting antiplatelet therapy with patient's vascular surgeon Dr. Edilia Bo - Will be due for routine colonoscopy 10/2020

## 2018-04-06 NOTE — Progress Notes (Signed)
[facility] Progress Note  Subjective:  [name] is a 67 y.o. male with history of PAD, tobacco abuse (cigars), T2DM, HTN, HLD who presents for hospital follow-up of severe anemia. He was admitted from 4/2-03/30/18 for hemoglobin of 5.7. Ferritin was 5, iron 13. He received 3 units pRBCs. Plavix and aspirin therapy held. 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 Fresh blood but no active bleed noted in area. FOBT negative. GI did not recommend colonoscopy as had relatively normal colonoscopy in 2016.  Patient reports feeling much better since discharge. He says he has more energy and legs hurt less. He has been taking iron and denies problems with constipation. He has not been taking aspirin or plavix.   ROS: No tachycardia, no dyspnea  Allergies  Allergen Reactions  . Glipizide Other (See Comments)    REACTION IS SIDE EFFECT Severe hypoglycemia to 40s.     Social History   Tobacco Use  . Smoking status: Current Some Day Smoker    Years: 24.00    Types: Cigars  . Smokeless tobacco: Never Used  . Tobacco comment: smokes 1-2 cigars per day   Substance Use Topics  . Alcohol use: Yes    Alcohol/week: 1.2 oz    Types: 2 Cans of beer per week    Objective: Blood pressure 136/72, pulse 71, temperature 98.2 F (36.8 C), temperature source Oral, height 5\' 7"  (1.702 m), weight 203 lb 12.8 oz (92.4 kg), SpO2 92 %. Body mass index is 31.92 kg/m. Constitutional: Pleasant, obese male in NAD Cardiovascular: RRR, S1, S2, no m/r/g. Palpable DP pulses.  Pulmonary/Chest: Effort normal and breath sounds normal.  Musculoskeletal: Lower legs TTP.  Skin: Skin is warm and dry. No rash noted.  Psychiatric: Normal mood and affect.  Vitals reviewed  Assessment/Plan: Anemia - Asymptomatic. Tolerating iron supplementation.  - Pending stable repeat hgb on CBC, will discuss restarting antiplatelet therapy with  patient's vascular surgeon Dr. Scot Dock - Will be due for routine colonoscopy 10/2020  PAD (peripheral artery disease) ([place]) - Interested in increasing stamina. - Will refer to cardiac rehab  Follow-up for leg pain as needed. (Should also check lipid panel at that time).   Olene Floss, MD [place], PGY-3

## 2018-04-06 NOTE — Assessment & Plan Note (Addendum)
-   Asymptomatic. Tolerating iron supplementation.  - Pending stable repeat hgb on CBC, will discuss restarting antiplatelet therapy with patient's vascular surgeon Dr. Scot Dock - Will be due for routine colonoscopy 10/2020

## 2018-04-06 NOTE — Assessment & Plan Note (Signed)
-   Interested in increasing stamina. - Will refer to cardiac rehab

## 2018-04-06 NOTE — Telephone Encounter (Signed)
Called patient to let him know his hemoglobin was stable and that he should restart plavix and hold aspirin (messaged Dr. Scot Dock who agrees). Will also start vitamin D supplementation.   Olene Floss, MD [place], PGY-3

## 2018-04-13 ENCOUNTER — Telehealth (HOSPITAL_COMMUNITY): Payer: Self-pay

## 2018-04-13 NOTE — Telephone Encounter (Signed)
Patients insurance is active and benefits verified through medicare A/B - No co-pay, no deductible, no out of pocket, no co-insurance, and no pre-authorization is required. Passport/reference [id number]  Patients insurance is active through [place]. Reference [phone] Will fax over Medicaid Reimbursement form to Dr.Fitzgerald at [facility].

## 2018-04-24 IMAGING — CR DG CHEST 2V
2 series · 2 of 2 positions shown · non-contrast
Comparison: September 02, 2014

CLINICAL DATA: Shortness of Breath

EXAM:
CHEST  2 VIEW

[chest pa]
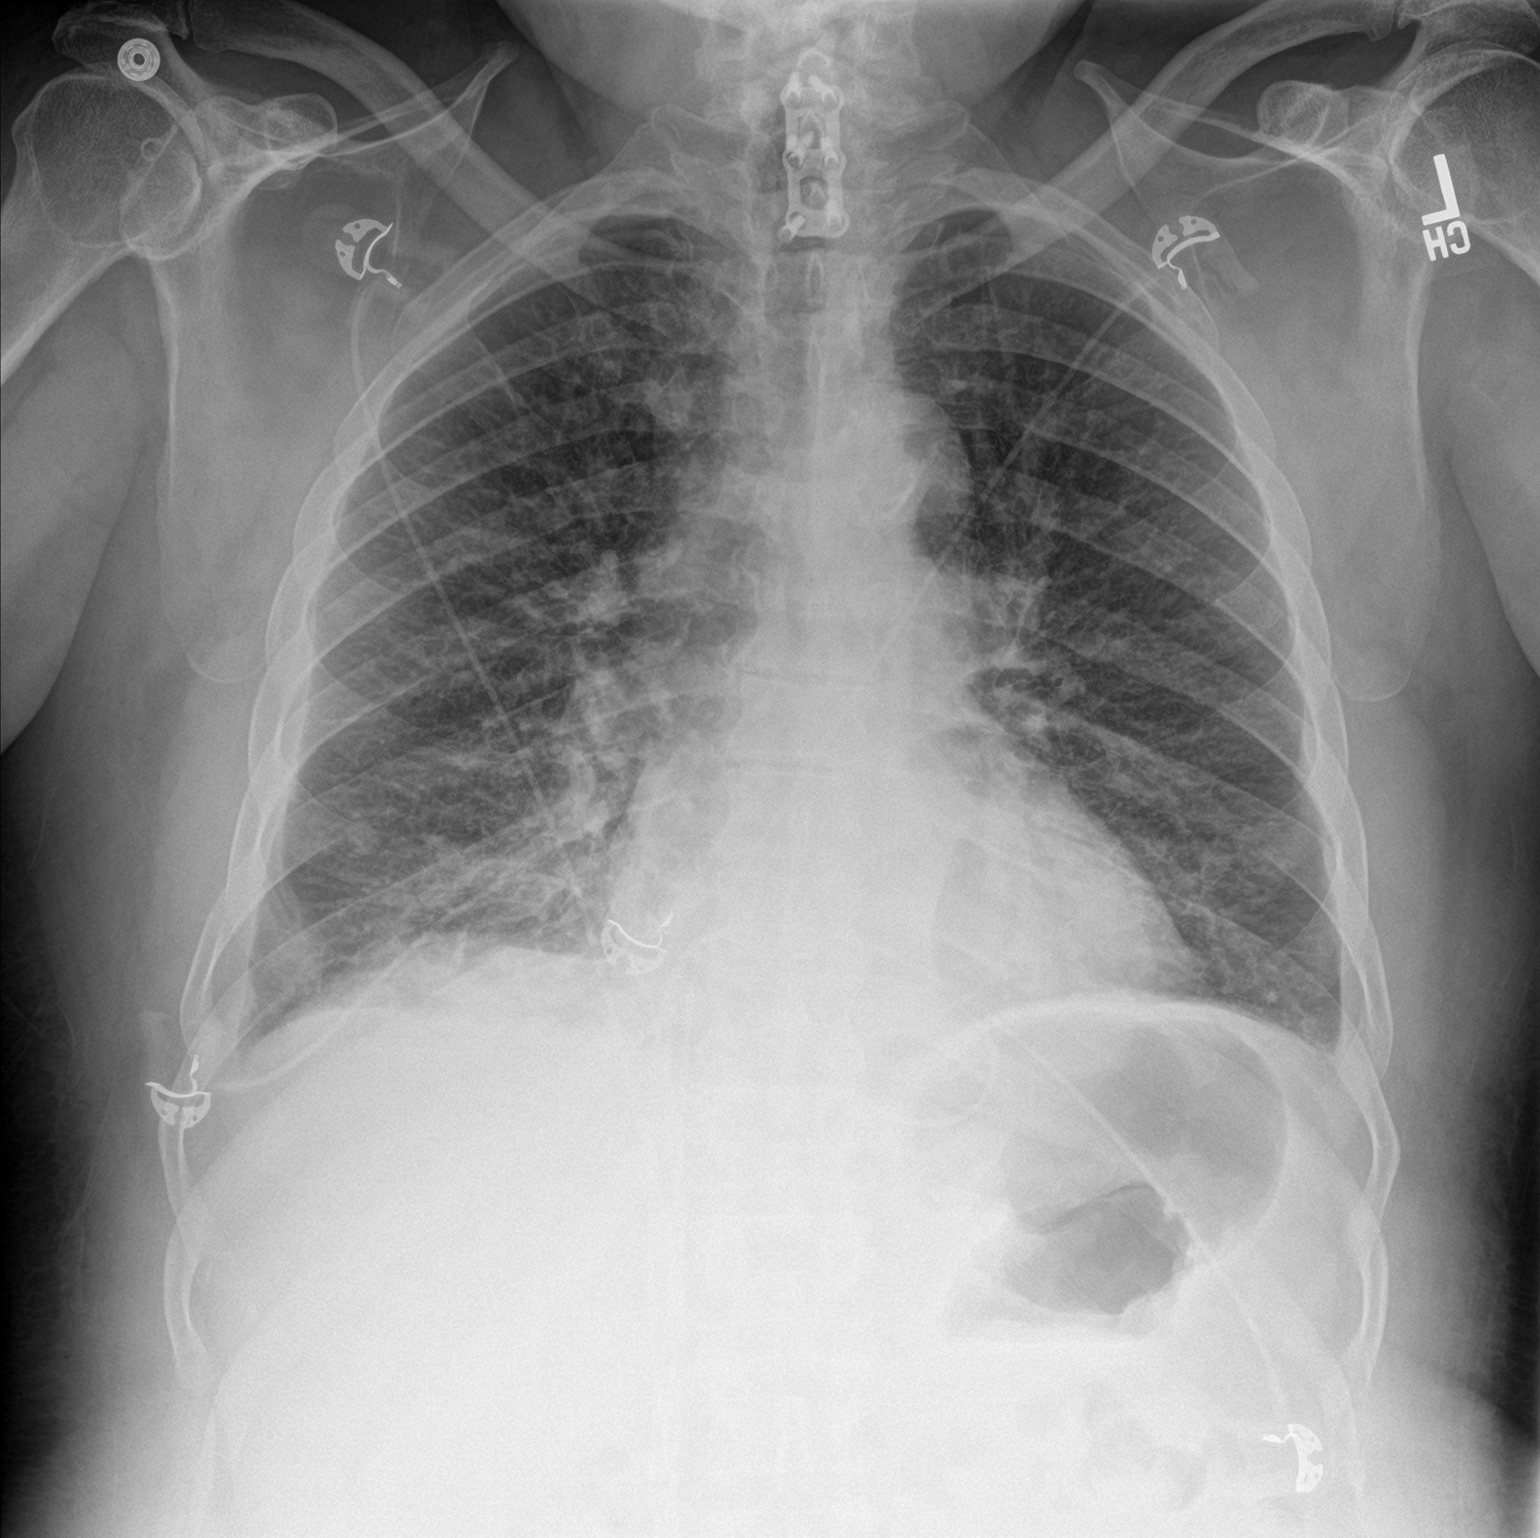

[chest lat]
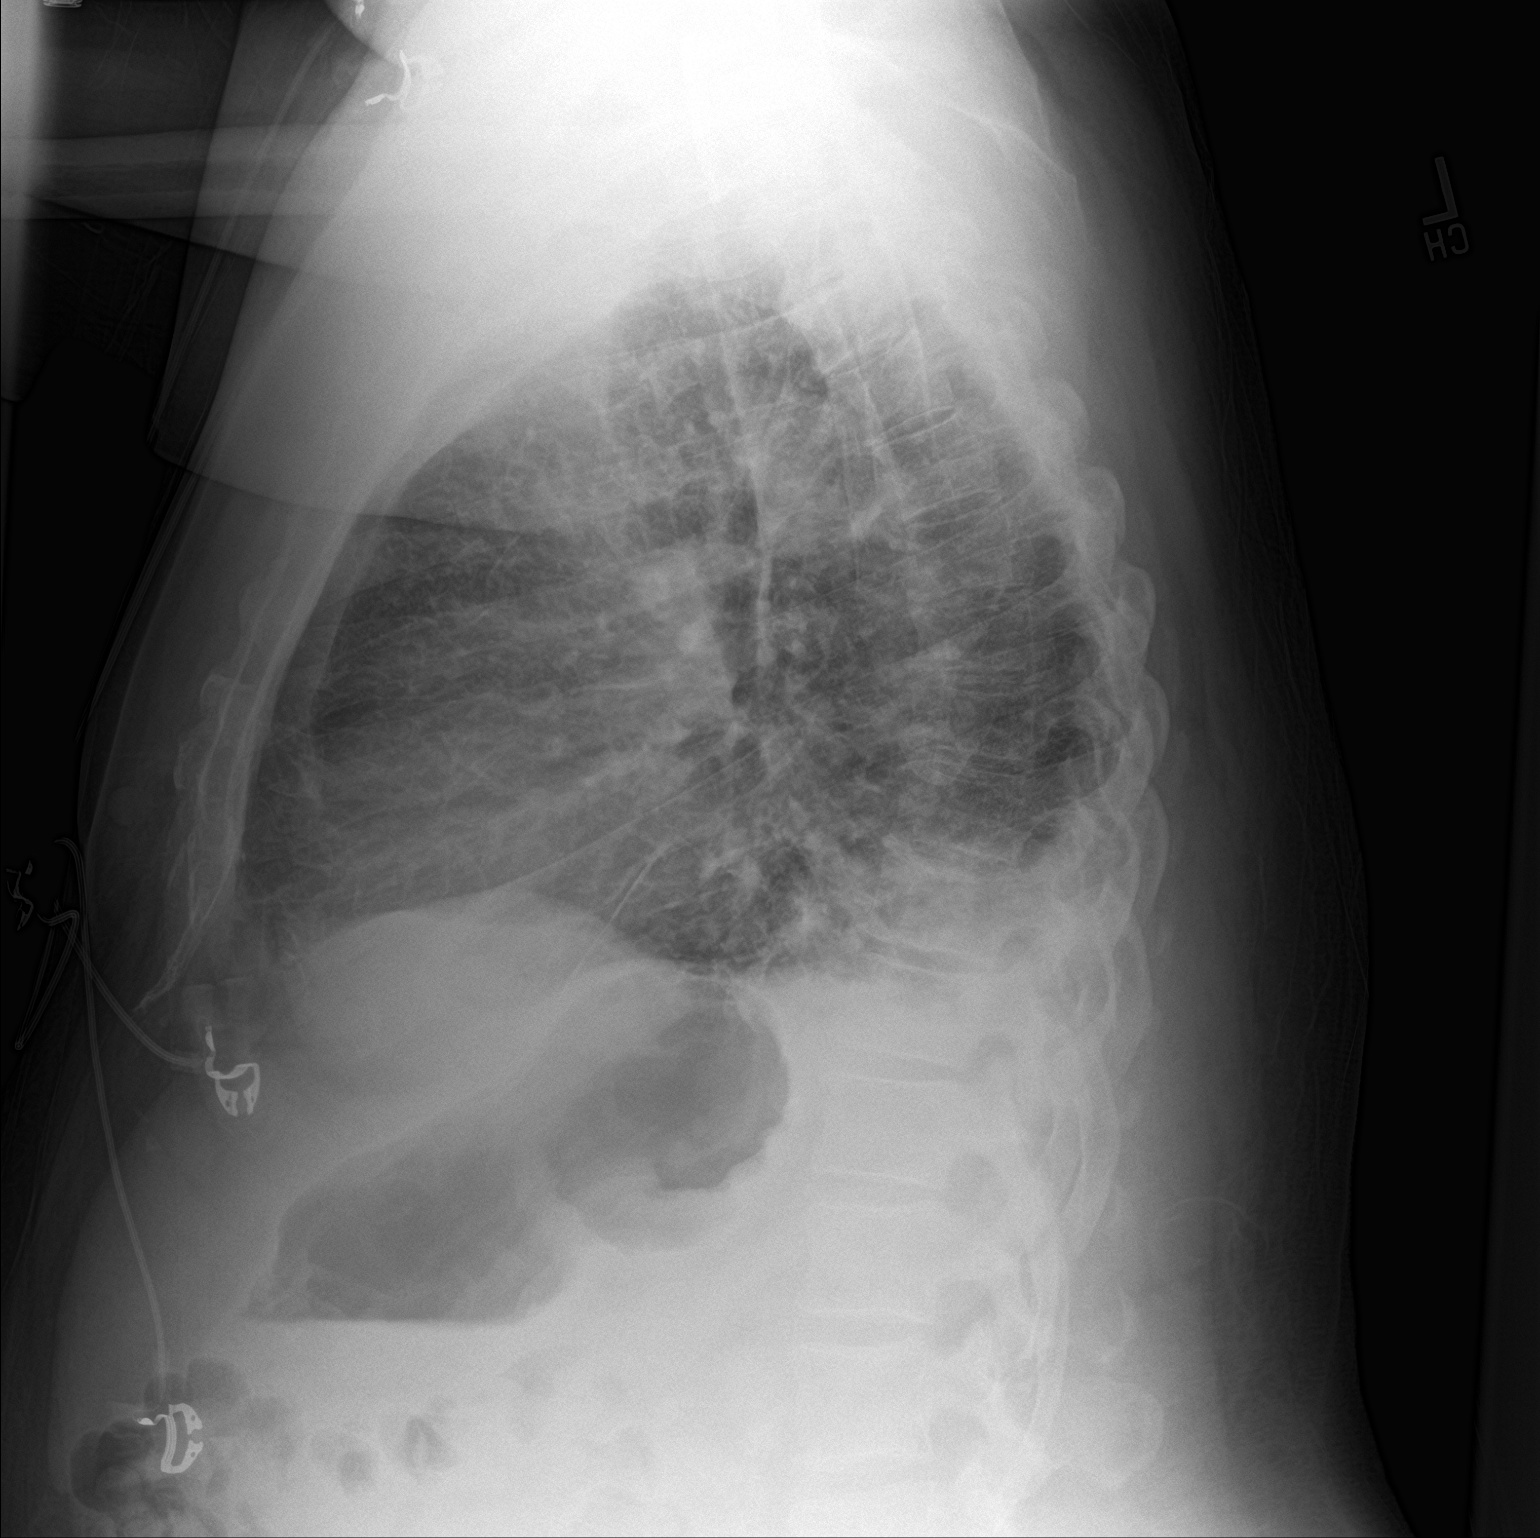

[2 of 2 positions shown; findings below may reference images not displayed]

FINDINGS: There is a nodular opacity on the right, likely a nipple shadow.
There is interstitial pulmonary edema with small right pleural
effusion. There is atelectatic change in the lung bases as well.
There is borderline cardiomegaly with mild pulmonary venous
hypertension. There is atherosclerotic calcification in the aorta.
There is postoperative change in the lower cervical spine region.
IMPRESSION: Evidence a degree of congestive heart failure. Atelectasis in the
lung bases. Early pneumonia in the bases cannot be excluded
radiographically.

Suspect nipple shadow right base. Repeat study with nipple markers
to confirm advised.

## 2018-04-26 ENCOUNTER — Other Ambulatory Visit: Payer: Self-pay | Admitting: Internal Medicine

## 2018-05-18 ENCOUNTER — Other Ambulatory Visit: Payer: Self-pay | Admitting: Internal Medicine

## 2018-05-28 ENCOUNTER — Encounter (HOSPITAL_COMMUNITY): Payer: Self-pay | Admitting: Emergency Medicine

## 2018-05-28 ENCOUNTER — Emergency Department (HOSPITAL_COMMUNITY)
Admission: EM | Admit: 2018-05-28 | Discharge: 2018-05-29 | Disposition: A | Discharge status: Home / Self Care | Payer: Medicare Other | Attending: Emergency Medicine | Admitting: Emergency Medicine

## 2018-05-28 ENCOUNTER — Other Ambulatory Visit: Payer: Self-pay

## 2018-05-28 ENCOUNTER — Emergency Department (HOSPITAL_COMMUNITY): Payer: Medicare Other

## 2018-05-28 ENCOUNTER — Ambulatory Visit (INDEPENDENT_AMBULATORY_CARE_PROVIDER_SITE_OTHER): Payer: Medicare Other | Admitting: Family Medicine

## 2018-05-28 ENCOUNTER — Encounter: Payer: Self-pay | Admitting: Family Medicine

## 2018-05-28 VITALS — BP 118/72 | HR 77 | Temp 98.0°F | Ht 67.0 in | Wt 194.8 lb

## 2018-05-28 DIAGNOSIS — K859 Acute pancreatitis without necrosis or infection, unspecified: Secondary | ICD-10-CM

## 2018-05-28 DIAGNOSIS — Z79899 Other long term (current) drug therapy: Secondary | ICD-10-CM | POA: Insufficient documentation

## 2018-05-28 DIAGNOSIS — N3000 Acute cystitis without hematuria: Secondary | ICD-10-CM | POA: Diagnosis not present

## 2018-05-28 DIAGNOSIS — Z7902 Long term (current) use of antithrombotics/antiplatelets: Secondary | ICD-10-CM | POA: Diagnosis not present

## 2018-05-28 DIAGNOSIS — K760 Fatty (change of) liver, not elsewhere classified: Secondary | ICD-10-CM | POA: Diagnosis not present

## 2018-05-28 DIAGNOSIS — I10 Essential (primary) hypertension: Secondary | ICD-10-CM | POA: Diagnosis not present

## 2018-05-28 DIAGNOSIS — R1012 Left upper quadrant pain: Secondary | ICD-10-CM | POA: Diagnosis not present

## 2018-05-28 DIAGNOSIS — F17290 Nicotine dependence, other tobacco product, uncomplicated: Secondary | ICD-10-CM | POA: Diagnosis not present

## 2018-05-28 DIAGNOSIS — R109 Unspecified abdominal pain: Secondary | ICD-10-CM

## 2018-05-28 DIAGNOSIS — Z7984 Long term (current) use of oral hypoglycemic drugs: Secondary | ICD-10-CM | POA: Diagnosis not present

## 2018-05-28 DIAGNOSIS — N281 Cyst of kidney, acquired: Secondary | ICD-10-CM

## 2018-05-28 DIAGNOSIS — E119 Type 2 diabetes mellitus without complications: Secondary | ICD-10-CM | POA: Diagnosis not present

## 2018-05-28 LAB — COMPREHENSIVE METABOLIC PANEL
ALT: 9 U/L — ABNORMAL LOW (ref 17–63)
AST: 14 U/L — ABNORMAL LOW (ref 15–41)
Albumin: 4.1 g/dL (ref 3.5–5.0)
Alkaline Phosphatase: 80 U/L (ref 38–126)
Anion gap: 13 (ref 5–15)
BUN: 11 mg/dL (ref 6–20)
CO2: 22 mmol/L (ref 22–32)
Calcium: 9.3 mg/dL (ref 8.9–10.3)
Chloride: 100 mmol/L — ABNORMAL LOW (ref 101–111)
Creatinine, Ser: 0.97 mg/dL (ref 0.61–1.24)
GFR calc Af Amer: >60 mL/min (ref >60)
GFR calc non Af Amer: >60 mL/min (ref >60)
Glucose, Bld: 131 mg/dL — ABNORMAL HIGH (ref 65–99)
Potassium: 4.2 mmol/L (ref 3.5–5.1)
Sodium: 135 mmol/L (ref 135–145)
Total Bilirubin: 0.5 mg/dL (ref 0.3–1.2)
Total Protein: 8.3 g/dL — ABNORMAL HIGH (ref 6.5–8.1)

## 2018-05-28 LAB — URINALYSIS, ROUTINE W REFLEX MICROSCOPIC
Bilirubin Urine: NEGATIVE
Glucose, UA: NEGATIVE mg/dL
Ketones, ur: NEGATIVE mg/dL
Nitrite: POSITIVE — AB
Protein, ur: NEGATIVE mg/dL
Specific Gravity, Urine: 1.024 (ref 1.005–1.030)
pH: 5 (ref 5.0–8.0)

## 2018-05-28 LAB — CBC
HCT: 46.6 % (ref 39.0–52.0)
Hemoglobin: 14 g/dL (ref 13.0–17.0)
MCH: 21.6 pg — ABNORMAL LOW (ref 26.0–34.0)
MCHC: 30 g/dL (ref 30.0–36.0)
MCV: 71.8 fL — ABNORMAL LOW (ref 78.0–100.0)
Platelets: 272 10*3/uL (ref 150–400)
RBC: 6.49 MIL/uL — ABNORMAL HIGH (ref 4.22–5.81)
RDW: 21.3 % — ABNORMAL HIGH (ref 11.5–15.5)
WBC: 8.4 10*3/uL (ref 4.0–10.5)

## 2018-05-28 LAB — LIPASE, BLOOD: Lipase: 79 U/L — ABNORMAL HIGH (ref 11–51)

## 2018-05-28 MED ORDER — MORPHINE SULFATE (PF) 4 MG/ML IV SOLN
4.0000 mg | Freq: Once | INTRAVENOUS | Status: AC
  Administered 2018-05-28: 4 mg via INTRAVENOUS
  Filled 2018-05-28: qty 1

## 2018-05-28 MED ORDER — IOHEXOL 300 MG/ML  SOLN
100.0000 mL | Freq: Once | INTRAMUSCULAR | Status: AC | PRN
  Administered 2018-05-28: 100 mL via INTRAVENOUS

## 2018-05-28 NOTE — Patient Instructions (Signed)
Thank you for coming in today, it was so nice to see you! Today we talked about:    Abdominal pain: We do suggest that you go to the emergency department to be evaluated further. You will need urgent imaging of your abdomen to see what is going on.   If you have any questions or concerns, please do not hesitate to call the office at [phone]. You can also message me directly via MyChart.   Sincerely,  Smitty Cords, MD

## 2018-05-28 NOTE — ED Provider Notes (Signed)
Patient placed in Quick Look pathway, seen and evaluated  Chief Complaint: abdominal pain  HPI:   Patient presents to the ED urgent care with complaint of left-sided abdominal pain x3 days.  He states pain is sharp and constant, worse after eating.  He states he has not had any nausea vomiting or diarrhea, though states last BM was on Wednesday of last week.  Was evaluated at urgent care today and recommended to come to the ED for scan.  ROS: + Abdominal pain pain, + constipation  Physical Exam:   Gen: No distress  Neuro: Awake and Alert  Skin: Warm    Focused Exam: Abdomen is soft.  Tenderness to palpation of left upper and lower quadrants.  No guarding   Initiation of care has begun. The patient has been counseled on the process, plan, and necessity for staying for the completion/evaluation, and the remainder of the medical screening examination    Tayah Idrovo, [place], PA-C 05/28/18 [place], Wenda Overland, MD 05/30/18 1440

## 2018-05-28 NOTE — ED Triage Notes (Signed)
Patient to ED c/o L sided abdominal pain x 3 days, worse after eating and taking his medications. Patient denies N/V/D, no fevers/chills. Last BM 5 days ago. Patient adds that the pain intermittent radiates to his testicles.

## 2018-05-28 NOTE — Progress Notes (Signed)
   Subjective:    Patient ID: [name] , male   DOB: 09/17/1951 , 67 y.o..   MRN: [id number]  HPI  [name] is here for  Chief Complaint  Patient presents with  . Abdominal Pain    1. ABDOMINAL PAIN  Location: Left lower quadrant and radiates to the middle abdomen  Onset: 3 days ago   Severity: Very painful Quality: Crampy  Duration: Constant  Better with: Nothing identified Worse with: Eating   Symptoms Nausea/Vomiting: no  Diarrhea: no  Constipation: yes, no bowel movement in the last 5 days Melena/BRBPR: no  Hematemesis: no  Anorexia: yes  Fever/Chills: no  Dysuria: no  Rash: no  Wt loss: no  EtOH use: no  NSAIDs/ASA: no   Past Surgeries: No abdominal surgeries  Review of Systems: Per HPI.   Past Medical History: Patient Active Problem List   Diagnosis Date Noted  . Pain in both lower extremities 03/27/2018  . Symptomatic anemia 03/27/2018  . Pain of right lower extremity 12/10/2017  . Radiculopathy, cervical region 02/11/2017  . Depression 10/03/2016  . Back pain 06/24/2016  . Vision changes 02/02/2016  . Anemia 09/10/2015  . Claudication of both lower extremities ([place]) 07/15/2015  . Proteinuria 08/27/2014  . Diabetic neuropathy ([place]) 02/27/2014  . Iron deficiency anemia 08/01/2012  . Type 2 diabetes mellitus without complication ([place]) 35/46/5681  . GERD 01/14/2009  . HYPERCHOLESTEROLEMIA 02/22/2007  . OBESITY, NOS 02/22/2007  . Tobacco abuse 02/22/2007  . HYPERTENSION, BENIGN SYSTEMIC 02/22/2007  . PAD (peripheral artery disease) ([place]) 02/22/2007  . OSTEOARTHRITIS, MULTI SITES 02/22/2007    Medications: reviewed   Social Hx:  reports that he has been smoking cigars.  He has smoked for the past 24.00 years. He has never used smokeless tobacco.   Objective:   BP 118/72   Pulse 77   Temp 98 F (36.7 C) (Oral)   Ht 5\' 7"  (1.702 m)   Wt 194 lb 12.8 oz (88.4 kg)   SpO2 98%   BMI 30.51 kg/m  Physical Exam  Gen: Pleasant  gentleman, appears to be in pain and grimacing while sitting on examination table HEENT: NCAT, PERRL, clear conjunctiva, oropharynx clear, supple neck Cardiac: Regular rate and rhythm, normal S1/S2, capillary refill brisk  Respiratory: Clear to auscultation bilaterally, no wheezes, non-labored breathing Gastrointestinal: Soft, nondistended, tender to palpation in all quadrants except for right upper quadrant, also tender in epigastric area, hyperactive bowel sounds, negative Murphy sign  Assessment & Plan:   1. Abdominal pain: Abdominal pain for the last 3 days, mostly in the left lower quadrant but tender to palpation in all quadrants except right upper quadrant. His vital signs are normal and has no nausea, vomiting, diarrhea, hematochezia, hematemesis.  He is constipated which could cause his symptoms but I am concerned due to the severity of his pain.  He appears to be in pain just sitting on the examination table and unable to walk upright secondary to the pain.  He grimaces when abdomen is palpated.  Concern for possible bowel obstruction versus ischemic bowel versus diverticulitis versus appendicitis.    Advised that patient go to the emergency room immediately for further evaluation.  He declined at this time stating that he needed to pick up his granddaughters from school.  He agreed to go later on this evening.  Follow-up with ED evaluation.  Smitty Cords, MD [place], PGY-3

## 2018-05-29 ENCOUNTER — Other Ambulatory Visit: Payer: Self-pay | Admitting: Internal Medicine

## 2018-05-29 DIAGNOSIS — K859 Acute pancreatitis without necrosis or infection, unspecified: Secondary | ICD-10-CM | POA: Diagnosis not present

## 2018-05-29 MED ORDER — ONDANSETRON 4 MG PO TBDP: ORAL_TABLET | ORAL | 0 refills | Status: DC

## 2018-05-29 MED ORDER — MORPHINE SULFATE (PF) 4 MG/ML IV SOLN
4.0000 mg | Freq: Once | INTRAVENOUS | Status: AC
  Administered 2018-05-29: 4 mg via INTRAVENOUS
  Filled 2018-05-29: qty 1

## 2018-05-29 MED ORDER — CEPHALEXIN 500 MG PO CAPS: 500.0000 mg | ORAL_CAPSULE | Freq: Four times a day (QID) | ORAL | 0 refills | Status: DC

## 2018-05-29 MED ORDER — HYDROCODONE-ACETAMINOPHEN 5-325 MG PO TABS: 1.0000 | ORAL_TABLET | Freq: Four times a day (QID) | ORAL | 0 refills | Status: DC | PRN

## 2018-05-29 NOTE — Discharge Instructions (Addendum)
1. Medications: zofran, vicodin, Keflex; usual home medications 2. Treatment: rest, drink plenty of fluids, advance diet slowly 3. Follow Up: Please followup with your primary doctor in 2 days for discussion of your diagnoses and further evaluation after today's visit; if you do not have a primary care doctor use the resource guide provided to find one; Please return to the ER for persistent vomiting, high fevers or worsening symptoms

## 2018-05-29 NOTE — ED Notes (Signed)
Pt given water, no issues with drinking

## 2018-05-29 NOTE — ED Provider Notes (Signed)
[facility] EMERGENCY DEPARTMENT Provider Note   CSN: [id number] Arrival date & time: 05/28/18  1609     History   Chief Complaint Chief Complaint  Patient presents with  . Abdominal Pain    HPI [name] is a 67 y.o. male with a hx of anemia, arthritis, cervical radiculopathy, chronic low back pain, non-insulin-dependent diabetes, GERD, hypertension, deficiency anemia, diabetic neuropathy presents to the Emergency Department complaining of gradual, persistent, progressively worsening left-sided abdominal pain onset 3 days ago.  Patient reports pain is currently a 10/10.  He reports pain is significantly worsened after eating, drinking or taking his medications.  He denies nausea, vomiting or diarrhea.  Patient additionally denies fevers or chills.  He denies previous abdominal surgeries.  No international travel or known sick contacts.  Patient reports that the pain started in his left upper quadrant but now radiates down into his left lower quadrant and down into his testicles.  He denies dysuria, hematuria, penile discharge.  Treatments prior to arrival.  Patient denies drug usage, NSAID usage and alcohol usage.  The history is provided by the patient and medical records. No language interpreter was used.    Past Medical History:  Diagnosis Date  . Anemia   . Arthritis    OA  . Cervical radiculopathy    Dr. Vertell Limber neurosurgery  . Chronic lower back pain   . Diabetes mellitus    takes Metformin daily  . GERD (gastroesophageal reflux disease)    takes Protonix daily  . History of blood transfusion    "related to low HgB" ((09/10/2015  . Hyperlipidemia    takes Vytorin daily  . Hypertension    takes Benazepril and Bystolic daily  . PAD (peripheral artery disease) ([place])   . Pneumonia   . Shortness of breath dyspnea    with exertion  . Tobacco user   . Toe fracture, right 05/09/2011    Patient Active Problem List   Diagnosis Date Noted  . Pain in  both lower extremities 03/27/2018  . Symptomatic anemia 03/27/2018  . Pain of right lower extremity 12/10/2017  . Radiculopathy, cervical region 02/11/2017  . Depression 10/03/2016  . Back pain 06/24/2016  . Vision changes 02/02/2016  . Anemia 09/10/2015  . Claudication of both lower extremities ([place]) 07/15/2015  . Proteinuria 08/27/2014  . Diabetic neuropathy ([place]) 02/27/2014  . Iron deficiency anemia 08/01/2012  . Type 2 diabetes mellitus without complication ([place]) 80/99/8338  . GERD 01/14/2009  . HYPERCHOLESTEROLEMIA 02/22/2007  . OBESITY, NOS 02/22/2007  . Tobacco abuse 02/22/2007  . HYPERTENSION, BENIGN SYSTEMIC 02/22/2007  . PAD (peripheral artery disease) ([place]) 02/22/2007  . OSTEOARTHRITIS, MULTI SITES 02/22/2007    Past Surgical History:  Procedure Laterality Date  . ABDOMINAL AORTOGRAM W/LOWER EXTREMITY N/A 12/11/2017   Procedure: ABDOMINAL AORTOGRAM W/LOWER EXTREMITY;  Surgeon: Angelia Mould, MD;  Location: [facility];  Service: Cardiovascular;  Laterality: N/A;  . ANTERIOR CERVICAL DECOMP/DISCECTOMY FUSION  03/08/12   C6-7  . ANTERIOR CERVICAL DECOMP/DISCECTOMY FUSION  03/08/2012   Procedure: ANTERIOR CERVICAL DECOMPRESSION/DISCECTOMY FUSION 1 LEVEL/HARDWARE REMOVAL;  Surgeon: Erline Levine, MD;  Location: [facility];  Service: Neurosurgery;  Laterality: N/A;  revison of C5-7 anterior cervical decompression with fusion with Cervical Five-Thoracic One anterior cervical decompression with fusion with interbody prothesis plating and bonegraft  . BACK SURGERY  1996  . BYPASS GRAFT FEMORAL-PERONEAL Left 11/11/2016   Procedure: REDO LEFT FEMORAL-PERONEAL BYPASS WITH PROPATEN 6MM X 80CM GRAFT;  Surgeon:  Angelia Mould, MD;  Location: [facility] OR;  Service: Vascular;  Laterality: Left;  . COLONOSCOPY W/ BIOPSIES AND POLYPECTOMY  08/17/2012   f/u 5 years, 4 polyps, no high grade dysplasia or malignancy, tubular adenoma, hyperplastic polyops  . ENTEROSCOPY N/A  12/11/2015   Procedure: ENTEROSCOPY;  Surgeon: Carol Ada, MD;  Location: [facility] ENDOSCOPY;  Service: Endoscopy;  Laterality: N/A;  . ENTEROSCOPY N/A 03/29/2018   Procedure: ENTEROSCOPY;  Surgeon: Carol Ada, MD;  Location: [facility] ENDOSCOPY;  Service: Endoscopy;  Laterality: N/A;  . ESOPHAGOGASTRODUODENOSCOPY  08/17/2012   normal esophagus and GEJ, diffuse gastritis with erythema- no malignancy, reactive gastropathy  with focal intestinal metaplasia  . FEMORAL-POPLITEAL BYPASS GRAFT Left 01/06/2016   Procedure: Left  COMMON FEMORAL-BELOW KNEE POPLITEAL ARTERY Bypass using non-reversed translocated saphenous vein graft from left leg;  Surgeon: Mal Misty, MD;  Location: [facility];  Service: Vascular;  Laterality: Left;  . GIVENS CAPSULE STUDY N/A 11/24/2015   Procedure: GIVENS CAPSULE STUDY;  Surgeon: Juanita Craver, MD;  Location: [facility];  Service: Endoscopy;  Laterality: N/A;  . HOT HEMOSTASIS N/A 03/29/2018   Procedure: HOT HEMOSTASIS (ARGON PLASMA COAGULATION/BICAP);  Surgeon: Carol Ada, MD;  Location: [facility];  Service: Endoscopy;  Laterality: N/A;  . INGUINAL HERNIA REPAIR  1990's   right  . INTRAOPERATIVE ARTERIOGRAM Left 01/06/2016   Procedure: INTRA OPERATIVE ARTERIOGRAM LEFT LOWER LEG;  Surgeon: Mal Misty, MD;  Location: [facility];  Service: Vascular;  Laterality: Left;  . INTRAOPERATIVE ARTERIOGRAM Left 11/11/2016   Procedure: INTRA OPERATIVE ARTERIOGRAM LEFT LOWER EXTRIMITY;  Surgeon: Angelia Mould, MD;  Location: [facility];  Service: Vascular;  Laterality: Left;  . IR ANGIOGRAM FOLLOW UP STUDY  12/11/2017  . IR GENERIC HISTORICAL  10/24/2016   IR ANGIOGRAM FOLLOW UP STUDY  . LOWER EXTREMITY ANGIOGRAM Bilateral 07/30/2015   Procedure: Lower Extremity Angiogram;  Surgeon: Conrad Saginaw, MD;  Location: [facility] CV LAB;  Service: Cardiovascular;  Laterality: Bilateral;  . [place]   "lower"  . PERIPHERAL VASCULAR CATHETERIZATION N/A 07/30/2015   Procedure:  Abdominal Aortogram;  Surgeon: Conrad Heidelberg, MD;  Location: [facility];  Service: Cardiovascular;  Laterality: N/A;  . PERIPHERAL VASCULAR CATHETERIZATION N/A 10/24/2016   Procedure: Abdominal Aortogram;  Surgeon: Angelia Mould, MD;  Location: [facility] LAB;  Service: Cardiovascular;  Laterality: N/A;  . PERIPHERAL VASCULAR CATHETERIZATION N/A 10/24/2016   Procedure: Lower Extremity Angiography;  Surgeon: Angelia Mould, MD;  Location: [facility];  Service: Cardiovascular;  Laterality: N/A;  . PERIPHERAL VASCULAR INTERVENTION Right 12/11/2017   Procedure: PERIPHERAL VASCULAR INTERVENTION;  Surgeon: Angelia Mould, MD;  Location: [facility] CV LAB;  Service: Cardiovascular;  Laterality: Right;  Marland Kitchen VASCULAR SURGERY  ~ 2007   Stent SFA   . VEIN HARVEST Left 01/06/2016   Procedure: LEFT GREATER SAPPHENOUS VEIN HARVEST;  Surgeon: Mal Misty, MD;  Location: [facility];  Service: Vascular;  Laterality: Left;        Home Medications    Prior to Admission medications   Medication Sig Start Date End Date Taking? Authorizing Provider  acetaminophen (TYLENOL) 500 MG tablet Take 1,000 mg by mouth 3 (three) times daily as needed for moderate pain.   Yes [provider]  atorvastatin (LIPITOR) 40 MG tablet Take 1 tablet (40 mg total) by mouth daily. 02/06/18  Yes Rogue Bussing, MD  benazepril (LOTENSIN) 5 MG tablet Take 1 tablet (5 mg  total) by mouth daily. 12/10/17  Yes Rogue Bussing, MD  BYSTOLIC 20 MG TABS TAKE 1 TABLET BY MOUTH EVERY DAY 01/02/18  Yes Rogue Bussing, MD  clopidogrel (PLAVIX) 75 MG tablet Take 1 tablet (75 mg total) by mouth daily. 04/06/18  Yes Rogue Bussing, MD  ezetimibe (ZETIA) 10 MG tablet Take 1 tablet (10 mg total) by mouth daily. 12/10/17  Yes Rogue Bussing, MD  ferrous sulfate 325 (65 FE) MG tablet Take 1 tablet (325 mg total) by mouth daily. 03/30/18 03/25/19 Yes Winfrey, Alcario Drought, MD  gabapentin (NEURONTIN) 800 MG tablet TAKE 1.5 TABLETS (1,200 MG TOTAL) BY MOUTH 3 (THREE) TIMES DAILY. 11/16/16  Yes McKeag, Marylynn Pearson, MD  metFORMIN (GLUCOPHAGE) 1000 MG tablet Take 1 tablet (1,000 mg total) by mouth 2 (two) times daily with a meal. 12/10/17  Yes Rogue Bussing, MD  pantoprazole (PROTONIX) 40 MG tablet Take 1 tablet (40 mg total) by mouth daily. 12/10/17  Yes Fitzgerald, Sharman Cheek, MD  Phenylephrine-DM-GG (MUCINEX FAST-MAX CONGEST COUGH) 2.5-5-100 MG/5ML LIQD Take 30 mLs by mouth every 12 (twelve) hours as needed (cough).   Yes [provider]  polyethylene glycol (MIRALAX / GLYCOLAX) packet Take 17 g by mouth daily. 03/31/18  Yes Everrett Coombe, MD  senna (SENOKOT) 8.6 MG TABS tablet Take 1 tablet (8.6 mg total) by mouth at bedtime. 03/30/18  Yes Everrett Coombe, MD  Vitamin D, Ergocalciferol, (DRISDOL) 50000 units CAPS capsule Take 1 capsule once weekly for the next 8 weeks for low vitamin D. After that, take 2,000 units daily of vitamin D (over the counter). 04/06/18  Yes Rogue Bussing, MD  cephALEXin (KEFLEX) 500 MG capsule Take 1 capsule (500 mg total) by mouth 4 (four) times daily. 05/29/18   Marne Meline, Jarrett Soho, PA-C  dextromethorphan-guaiFENesin (MUCINEX DM) 30-600 MG 12hr tablet Take 1 tablet by mouth 2 (two) times daily as needed for cough. Patient not taking: Reported on 05/28/2018 04/01/17   Leo Grosser, MD  docusate sodium (COLACE) 100 MG capsule Take 1 capsule (100 mg total) by mouth 2 (two) times daily. Patient not taking: Reported on 03/27/2018 12/08/16   McKeag, Marylynn Pearson, MD  FLUoxetine (PROZAC) 10 MG capsule Take 1 capsule (10 mg total) by mouth daily. Patient not taking: Reported on 05/28/2018 12/10/17   Rogue Bussing, MD  HYDROcodone-acetaminophen (NORCO/VICODIN) 5-325 MG tablet Take 1-2 tablets by mouth every 6 (six) hours as needed. 05/29/18   Maziah Smola, Jarrett Soho, PA-C  ondansetron (ZOFRAN ODT) 4 MG disintegrating tablet 4mg  ODT q4  hours prn nausea/vomit 05/29/18   Alanna Storti, Jarrett Soho, PA-C    Family History Family History  Problem Relation Age of Onset  . Cancer Mother        colon Cancer  . Hyperlipidemia Mother   . Diabetes Mother   . Heart disease Father   . Hypertension Father   . Cancer Sister        Uterine  . Diabetes Sister   . Diabetes Brother     Social History Social History   Tobacco Use  . Smoking status: Current Some Day Smoker    Years: 24.00    Types: Cigars  . Smokeless tobacco: Never Used  . Tobacco comment: smokes 1-2 cigars per day   Substance Use Topics  . Alcohol use: Yes    Alcohol/week: 1.2 oz    Types: 2 Cans of beer per week  . Drug use: No     Allergies   Glipizide  Review of Systems Review of Systems  Constitutional: Negative for appetite change, diaphoresis, fatigue, fever and unexpected weight change.  HENT: Negative for mouth sores.   Eyes: Negative for visual disturbance.  Respiratory: Negative for cough, chest tightness, shortness of breath and wheezing.   Cardiovascular: Negative for chest pain.  Gastrointestinal: Positive for abdominal pain. Negative for constipation, diarrhea, nausea and vomiting.  Endocrine: Negative for polydipsia, polyphagia and polyuria.  Genitourinary: Negative for dysuria, frequency, hematuria and urgency.  Musculoskeletal: Negative for back pain and neck stiffness.  Skin: Negative for rash.  Allergic/Immunologic: Negative for immunocompromised state.  Neurological: Negative for syncope, light-headedness and headaches.  Hematological: Does not bruise/bleed easily.  Psychiatric/Behavioral: Negative for sleep disturbance. The patient is not nervous/anxious.      Physical Exam Updated Vital Signs BP 125/82   Pulse 69   Temp 97.7 F (36.5 C) (Oral)   Resp 16   SpO2 96%   Physical Exam  Constitutional: He appears well-developed and well-nourished. No distress.  Awake, alert, nontoxic appearance; uncomfortable appearing    HENT:  Head: Normocephalic and atraumatic.  Mouth/Throat: Oropharynx is clear and moist. No oropharyngeal exudate.  Eyes: Conjunctivae are normal. No scleral icterus.  Neck: Normal range of motion. Neck supple.  Cardiovascular: Normal rate, regular rhythm and intact distal pulses.  Pulmonary/Chest: Effort normal and breath sounds normal. No respiratory distress. He has no wheezes.  Equal chest expansion  Abdominal: Soft. Bowel sounds are normal. He exhibits no mass. There is tenderness in the epigastric area, left upper quadrant and left lower quadrant. There is guarding. There is no rigidity, no rebound and no CVA tenderness.  Musculoskeletal: Normal range of motion. He exhibits no edema.  Neurological: He is alert.  Speech is clear and goal oriented Moves extremities without ataxia  Skin: Skin is warm and dry. He is not diaphoretic.  Psychiatric: He has a normal mood and affect.  Nursing note and vitals reviewed.    ED Treatments / Results  Labs (all labs ordered are listed, but only abnormal results are displayed) Labs Reviewed  LIPASE, BLOOD - Abnormal; Notable for the following components:      Result Value   Lipase 79 (*)    All other components within normal limits  COMPREHENSIVE METABOLIC PANEL - Abnormal; Notable for the following components:   Chloride 100 (*)    Glucose, Bld 131 (*)    Total Protein 8.3 (*)    AST 14 (*)    ALT 9 (*)    All other components within normal limits  CBC - Abnormal; Notable for the following components:   RBC 6.49 (*)    MCV 71.8 (*)    MCH 21.6 (*)    RDW 21.3 (*)    All other components within normal limits  URINALYSIS, ROUTINE W REFLEX MICROSCOPIC - Abnormal; Notable for the following components:   APPearance HAZY (*)    Hgb urine dipstick MODERATE (*)    Nitrite POSITIVE (*)    Leukocytes, UA MODERATE (*)    Bacteria, UA MANY (*)    All other components within normal limits  URINE CULTURE     Radiology Ct Abdomen Pelvis  W Contrast  Result Date: 05/28/2018 CLINICAL DATA:  Initial evaluation for acute left-sided abdominal pain. EXAM: CT ABDOMEN AND PELVIS WITH CONTRAST TECHNIQUE: Multidetector CT imaging of the abdomen and pelvis was performed using the standard protocol following bolus administration of intravenous contrast. CONTRAST:  149mL OMNIPAQUE IOHEXOL 300 MG/ML  SOLN COMPARISON:  Prior  CT from 06/30/2008. FINDINGS: Lower chest: Visualized lung bases are clear. Scattered aortic valvular calcifications partially visualized. Hepatobiliary: Diffuse hypoattenuation liver suggestive of steatosis. Liver otherwise unremarkable. Gallbladder within normal limits. No biliary dilatation. Pancreas: Subtle mild hazy stranding about the mid pancreatic body, which could reflect sequelae of early/mild acute pancreatitis. No loculated collections or other abnormality. No evidence for pancreatic necrosis. Spleen: Unremarkable. Adrenals/Urinary Tract: Adrenal glands are normal. Kidneys equal in size with symmetric enhancement. 4 cm cyst present at the lower pole the right kidney. Few scattered calcific densities at the bilateral renal hila felt to be most consistent with vascular calcifications. No nephrolithiasis or hydronephrosis. No focal enhancing renal mass. No hydroureter. Bladder largely decompressed without acute abnormality. Stomach/Bowel: Stomach within normal limits. No evidence for bowel obstruction. No abnormal wall thickening, mucosal enhancement, or inflammatory fat stranding seen about the bowels. Normal appendix. Vascular/Lymphatic: Advanced aorto bi-iliac atherosclerotic disease. No aneurysm. Mesenteric vessels patent proximally. Postsurgical changes present at the left groin. No adenopathy. Reproductive: Prostate normal. Other: No free air or fluid. Musculoskeletal: No acute osseous abnormality. No worrisome lytic or blastic osseous lesions. Moderate to advanced degenerate spondylolysis and facet arthrosis noted at L4-5 and  L5-S1. IMPRESSION: 1. Subtle inflammatory stranding about the pancreas, suggestive of possible acute pancreatitis. Correlation with serum lipase recommended. No complication identified. 2. No other acute intra-abdominal or pelvic process. 3. Advanced aorto bi-iliac atherosclerotic disease with postsurgical changes within the left groin. No aneurysm. 4. Mild hepatic steatosis. Electronically Signed   By: Jeannine Boga M.D.   On: 05/28/2018 20:46    Procedures Procedures (including critical care time)  Medications Ordered in ED Medications  iohexol (OMNIPAQUE) 300 MG/ML solution 100 mL (100 mLs Intravenous Contrast Given 05/28/18 2011)  morphine 4 MG/ML injection 4 mg (4 mg Intravenous Given 05/28/18 2340)  morphine 4 MG/ML injection 4 mg (4 mg Intravenous Given 05/29/18 0245)     Initial Impression / Assessment and Plan / ED Course  I have reviewed the triage vital signs and the nursing notes.  Pertinent labs & imaging results that were available during my care of the patient were reviewed by me and considered in my medical decision making (see chart for details).  Clinical Course as of May 30 415  Tue May 29, 2018  0007 No anemia today  Hemoglobin: 14.0 [HM]  0007 No Leukocytosis.  WBC: 8.4 [HM]  0007 Afebrile without tachycardia or hypotension.  Temp: 97.9 F (36.6 C) [HM]  0008 Urinalysis with evidence of urinary tract infection.  Nitrite(!): POSITIVE [HM]  0008 Evaded lipase.  Last lipase was normal however lipase before that was 99.  Patient denies a history of pancreatitis.  Lipase(!): 79 [HM]  0008 No elevation of AST or ALT.  AST(!): 14 [HM]  0008 Patient initially hypertensive the blood pressure has improved after pain control.  BP: 125/82 [HM]  0008 CT scan with no gallbladder distention or Colelithiasis.  No biliary dilatation.  Pancreas does have some stranding about the pancreatic body.  Patient noticed to have a 4 cm cyst in the lower pole of the right kidney but no  nephrolithiasis or hydronephrosis is noted.  I personally evaluated these images.  CT Abdomen Pelvis W Contrast [HM]  0400 Pain has improved significantly.  He has tolerated PO without difficulty, vomiting or return of pain.   [HM]    Clinical Course User Index [HM] Laquanna Veazey, Gwenlyn Perking   Patient presents with left upper quadrant and left lower quadrant abdominal pain.  Labs  are without leukocytosis.  Patient is afebrile without tachycardia.  No evidence of sepsis.  Patient does have a urinary tract infection but no elevation in serum creatinine.  Also of note, patient has a renal cyst which she will need followed up with his primary care.  CT scan shows evidence of pancreatitis.  Patient given pain control here and has been able to tolerate p.o. without worsening pain or vomiting.  Abdomen on repeat exam is soft and nontender.  Pt reports testicular pain resolved with abd pain.  Discussed home care, clear liquids, advancing diet slowly and reasons to return immediately to the emergency department.  Patient states understanding and is in agreement with the plan.  Patient noted to be hypertensive in the emergency department.  No signs of hypertensive urgency.  Discussed with patient the need for close follow-up and management by their primary care physician.    Final Clinical Impressions(s) / ED Diagnoses   Final diagnoses:  Left upper quadrant pain  Acute pancreatitis, unspecified complication status, unspecified pancreatitis type  Acute cystitis without hematuria    ED Discharge Orders        Ordered    HYDROcodone-acetaminophen (NORCO/VICODIN) 5-325 MG tablet  Every 6 hours PRN     05/29/18 0402    ondansetron (ZOFRAN ODT) 4 MG disintegrating tablet     05/29/18 0402    cephALEXin (KEFLEX) 500 MG capsule  4 times daily     05/29/18 0402       Spyridon Hornstein, Jarrett Soho, PA-C 05/29/18 Tilman Neat, MD 06/05/18 [phone]

## 2018-05-30 ENCOUNTER — Encounter: Payer: Self-pay | Admitting: Family Medicine

## 2018-05-30 ENCOUNTER — Ambulatory Visit (INDEPENDENT_AMBULATORY_CARE_PROVIDER_SITE_OTHER): Payer: Medicare Other | Admitting: Family Medicine

## 2018-05-30 ENCOUNTER — Other Ambulatory Visit: Payer: Self-pay

## 2018-05-30 DIAGNOSIS — K859 Acute pancreatitis without necrosis or infection, unspecified: Secondary | ICD-10-CM | POA: Insufficient documentation

## 2018-05-30 DIAGNOSIS — K858 Other acute pancreatitis without necrosis or infection: Secondary | ICD-10-CM

## 2018-05-30 DIAGNOSIS — N390 Urinary tract infection, site not specified: Secondary | ICD-10-CM | POA: Diagnosis not present

## 2018-05-30 MED ORDER — HYDROCODONE-ACETAMINOPHEN 5-325 MG PO TABS: 1.0000 | ORAL_TABLET | Freq: Four times a day (QID) | ORAL | 0 refills | Status: DC | PRN

## 2018-05-30 MED ORDER — CLOPIDOGREL BISULFATE 75 MG PO TABS: 75.0000 mg | ORAL_TABLET | Freq: Every day | ORAL | 0 refills | Status: DC

## 2018-05-30 NOTE — Addendum Note (Signed)
Addended by: Talbert Cage L on: 05/30/2018 01:36 PM   Modules accepted: Orders

## 2018-05-30 NOTE — Patient Instructions (Signed)
Good to see you today!  Thanks for coming in.  For the UTI - finish all the antibiotics.  May pull the capsules apart and take the powder with food If you have fever or worsening back pain or if not back normal in one week then come back  For the Pancreatitis - keep taking liquid until can stop the pain medications then start slowly back to solid food If pain is a lot worse or fever or lots of vomiting then go to the ER  For the Swallowing -  Crush or take apart large pills or capsules Take medications one at a time over an hour or so If this is not going away may need to send you to an Gastroentrlogist for a study   Make an appointment to see Dr Ola Spurr in 2 weeks

## 2018-05-30 NOTE — Assessment & Plan Note (Signed)
Diagnosed in ER.  Await cultures.  Modified the way he takes keflex see after visit summary

## 2018-05-30 NOTE — Progress Notes (Signed)
Subjective  [name] is a 67 y.o. male is presenting with the following  PANCREATITIS - TROUBLE SWALLOWING Diagnosed as pancreatitis, mild 2 days ago in ER.  Symptoms began 5 days ago with epigastric abdomen pain and troulbe swallowing pills.  Did not have this before.  Taking only liquids so unsure if he can swallow food.  Pain is improved with liquids and pain medications.  Taking hydrocodone four times a day.  No vomiting or fever or rash.  Does not drink but rarely and none lately.  No new medications  UTI Diagnosed in ER 2 days ago.  Culture pending.  On keflex having trouble swallowing large caps.  No fever or dysuria or bleeding.  Had a UTI he thinks years ago  Chief Complaint noted Review of Symptoms - see HPI PMH - Smoking status noted.    Objective Vital Signs reviewed BP 128/68   Pulse 65   Temp 97.7 F (36.5 C) (Oral)   Ht 5\' 7"  (1.702 m)   Wt 193 lb 9.6 oz (87.8 kg)   SpO2 98%   BMI 30.32 kg/m  Alert mild distres Able to get up and down from exam table without assistance Abdomen: soft and tenderover epigastric area without masses, organomegaly or hernias noted.  No guarding or rebound No CVAT MM - moist    Assessments/Plans  See after visit summary for details of patient instuctions  Pancreatitis, acute Diagnosed in ER with CT and lipase.  Slowly improving pain but now unable to swallow pills since pain started.  Seems related chronologically but unclear mechanism.  He is keeping hydrated.  Will modify the way he takes his medications and monitor.  Is already on PPI.  If swallowing trouble persists after abdomen pain improved may need endoscopy Refilled his hydrocodone for pain No clear precipiating factor for pancreatitis   UTI (urinary tract infection) Diagnosed in ER.  Await cultures.  Modified the way he takes keflex see after visit summary

## 2018-05-30 NOTE — Assessment & Plan Note (Signed)
Diagnosed in ER with CT and lipase.  Slowly improving pain but now unable to swallow pills since pain started.  Seems related chronologically but unclear mechanism.  He is keeping hydrated.  Will modify the way he takes his medications and monitor.  Is already on PPI.  If swallowing trouble persists after abdomen pain improved may need endoscopy Refilled his hydrocodone for pain No clear precipiating factor for pancreatitis

## 2018-05-31 LAB — URINE CULTURE: Culture: 80000 — AB

## 2018-06-01 ENCOUNTER — Telehealth: Payer: Self-pay

## 2018-06-01 NOTE — Telephone Encounter (Signed)
Post ED Visit - Positive Culture Follow-up  Culture report reviewed by antimicrobial stewardship pharmacist:  []  Elenor Quinones, Pharm.D. []  Heide Guile, Pharm.D., BCPS AQ-ID []  Parks Neptune, Pharm.D., BCPS [x]  Alycia Rossetti, Pharm.D., BCPS []  [place], Pharm.D., BCPS, AAHIVP []  Legrand Como, Pharm.D., BCPS, AAHIVP []  Salome Arnt, PharmD, BCPS []  Wynell Balloon, PharmD []  Vincenza Hews, PharmD, BCPS  Positive urine culture Treated with cephalexin, organism sensitive to the same and no further patient follow-up is required at this time.  Genia Del 06/01/2018, 9:32 AM

## 2018-06-10 ENCOUNTER — Emergency Department (HOSPITAL_COMMUNITY)
Admission: EM | Admit: 2018-06-10 | Discharge: 2018-06-10 | Disposition: A | Discharge status: Home / Self Care | Payer: Medicare Other | Attending: Emergency Medicine | Admitting: Emergency Medicine

## 2018-06-10 ENCOUNTER — Encounter (HOSPITAL_COMMUNITY): Payer: Self-pay | Admitting: Emergency Medicine

## 2018-06-10 ENCOUNTER — Emergency Department (HOSPITAL_COMMUNITY): Payer: Medicare Other

## 2018-06-10 DIAGNOSIS — Z79899 Other long term (current) drug therapy: Secondary | ICD-10-CM | POA: Diagnosis not present

## 2018-06-10 DIAGNOSIS — R1011 Right upper quadrant pain: Secondary | ICD-10-CM | POA: Diagnosis not present

## 2018-06-10 DIAGNOSIS — F17290 Nicotine dependence, other tobacco product, uncomplicated: Secondary | ICD-10-CM | POA: Insufficient documentation

## 2018-06-10 DIAGNOSIS — Z7902 Long term (current) use of antithrombotics/antiplatelets: Secondary | ICD-10-CM | POA: Diagnosis not present

## 2018-06-10 DIAGNOSIS — K5900 Constipation, unspecified: Secondary | ICD-10-CM | POA: Diagnosis not present

## 2018-06-10 DIAGNOSIS — I10 Essential (primary) hypertension: Secondary | ICD-10-CM | POA: Insufficient documentation

## 2018-06-10 DIAGNOSIS — E1140 Type 2 diabetes mellitus with diabetic neuropathy, unspecified: Secondary | ICD-10-CM | POA: Diagnosis not present

## 2018-06-10 DIAGNOSIS — Z7984 Long term (current) use of oral hypoglycemic drugs: Secondary | ICD-10-CM | POA: Insufficient documentation

## 2018-06-10 DIAGNOSIS — R110 Nausea: Secondary | ICD-10-CM | POA: Diagnosis not present

## 2018-06-10 DIAGNOSIS — K859 Acute pancreatitis without necrosis or infection, unspecified: Secondary | ICD-10-CM

## 2018-06-10 DIAGNOSIS — K760 Fatty (change of) liver, not elsewhere classified: Secondary | ICD-10-CM | POA: Diagnosis not present

## 2018-06-10 LAB — URINALYSIS, ROUTINE W REFLEX MICROSCOPIC
Bacteria, UA: NONE SEEN
Bilirubin Urine: NEGATIVE
Glucose, UA: NEGATIVE mg/dL
Hgb urine dipstick: NEGATIVE
Ketones, ur: 20 mg/dL — AB
Nitrite: NEGATIVE
Protein, ur: NEGATIVE mg/dL
Specific Gravity, Urine: 1.031 — ABNORMAL HIGH (ref 1.005–1.030)
pH: 5 (ref 5.0–8.0)

## 2018-06-10 LAB — CBC
HCT: 48.3 % (ref 39.0–52.0)
Hemoglobin: 14.5 g/dL (ref 13.0–17.0)
MCH: 21.8 pg — ABNORMAL LOW (ref 26.0–34.0)
MCHC: 30 g/dL (ref 30.0–36.0)
MCV: 72.5 fL — ABNORMAL LOW (ref 78.0–100.0)
Platelets: 354 10*3/uL (ref 150–400)
RBC: 6.66 MIL/uL — ABNORMAL HIGH (ref 4.22–5.81)
RDW: 19.5 % — ABNORMAL HIGH (ref 11.5–15.5)
WBC: 7.7 10*3/uL (ref 4.0–10.5)

## 2018-06-10 LAB — COMPREHENSIVE METABOLIC PANEL
ALT: 12 U/L — ABNORMAL LOW (ref 17–63)
AST: 19 U/L (ref 15–41)
Albumin: 3.9 g/dL (ref 3.5–5.0)
Alkaline Phosphatase: 90 U/L (ref 38–126)
Anion gap: 11 (ref 5–15)
BUN: 9 mg/dL (ref 6–20)
CO2: 22 mmol/L (ref 22–32)
Calcium: 9.5 mg/dL (ref 8.9–10.3)
Chloride: 99 mmol/L — ABNORMAL LOW (ref 101–111)
Creatinine, Ser: 0.84 mg/dL (ref 0.61–1.24)
GFR calc Af Amer: >60 mL/min (ref >60)
GFR calc non Af Amer: >60 mL/min (ref >60)
Glucose, Bld: 164 mg/dL — ABNORMAL HIGH (ref 65–99)
Potassium: 4.2 mmol/L (ref 3.5–5.1)
Sodium: 132 mmol/L — ABNORMAL LOW (ref 135–145)
Total Bilirubin: 0.4 mg/dL (ref 0.3–1.2)
Total Protein: 8.2 g/dL — ABNORMAL HIGH (ref 6.5–8.1)

## 2018-06-10 LAB — LIPASE, BLOOD: Lipase: 88 U/L — ABNORMAL HIGH (ref 11–51)

## 2018-06-10 MED ORDER — FENTANYL CITRATE (PF) 100 MCG/2ML IJ SOLN
50.0000 ug | Freq: Once | INTRAMUSCULAR | Status: AC
  Administered 2018-06-10: 50 ug via INTRAVENOUS
  Filled 2018-06-10: qty 2

## 2018-06-10 MED ORDER — HYDROMORPHONE HCL 2 MG/ML IJ SOLN
0.5000 mg | Freq: Once | INTRAMUSCULAR | Status: AC
  Administered 2018-06-10: 0.5 mg via INTRAVENOUS
  Filled 2018-06-10: qty 1

## 2018-06-10 MED ORDER — SODIUM CHLORIDE 0.9 % IV BOLUS
500.0000 mL | Freq: Once | INTRAVENOUS | Status: AC
  Administered 2018-06-10: 500 mL via INTRAVENOUS

## 2018-06-10 MED ORDER — MORPHINE SULFATE (PF) 4 MG/ML IV SOLN
4.0000 mg | Freq: Once | INTRAVENOUS | Status: AC
  Administered 2018-06-10: 4 mg via INTRAVENOUS
  Filled 2018-06-10: qty 1

## 2018-06-10 MED ORDER — HYDROCODONE-ACETAMINOPHEN 5-325 MG PO TABS: 1.0000 | ORAL_TABLET | Freq: Four times a day (QID) | ORAL | 0 refills | Status: DC | PRN

## 2018-06-10 NOTE — ED Notes (Signed)
Patient verbalizes understanding of discharge instructions. Opportunity for questioning and answers were provided. Armband removed by staff, pt discharged from ED ambulatory.   

## 2018-06-10 NOTE — Discharge Instructions (Signed)
Take pain medication for pain.   Continue clear liquids diet only to help with symptoms.   Follow-up with referred GI doctor for further evaluation. Call their office and arrange for an appointment.   Return to the emergency department for any fever, vomiting, worsening pain, difficulty breathing or any other worsening or concerning symptoms.

## 2018-06-10 NOTE — ED Provider Notes (Signed)
[name] EMERGENCY DEPARTMENT Provider Note   CSN: [id number] Arrival date & time: 06/10/18  5093     History   Chief Complaint Chief Complaint  Patient presents with  . Abdominal Pain    HPI [name] is a 67 y.o. male with PMH/o past medical history of cervical radiculopathy, GERD, diabetes, hyperlipidemia who presents for evaluation of 3 days of progressively worsening right upper quadrant abdominal pain.  Patient reports that the pain is worse in the right upper quadrant but he is having some pain throughout his entire abdomen.  He feels like the pain radiates down the right side into his right testicle.  Patient states that he was seen in the ED last week on 05/28/2018 for evaluation of epigastric and left upper quadrant abdominal pain.  Patient reports that at that time, he was told that he had pancreatitis.  He was discharged home with pain medications.  He was also told he had a UTI was discharged home with antibiotics.  Patient reports that he ran out of the pain medications 4 days ago which is when his pain started becoming worse.  He states that his pain is worsened by attempts to eat or by taking his medication for it.  Has had some nausea but denies any vomiting.  He reports his last bowel movement approximately 1 week ago.  Patient states he still passing flatus.  No history of abdominal surgeries or bowel obstructions.  Patient denies any fevers, chest pain, difficulty breathing, dysuria, hematuria.  The history is provided by the patient.    Past Medical History:  Diagnosis Date  . Anemia   . Arthritis    OA  . Cervical radiculopathy    Dr. Vertell Limber neurosurgery  . Chronic lower back pain   . Diabetes mellitus    takes Metformin daily  . GERD (gastroesophageal reflux disease)    takes Protonix daily  . History of blood transfusion    "related to low HgB" ((09/10/2015  . Hyperlipidemia    takes Vytorin daily  . Hypertension    takes Benazepril  and Bystolic daily  . PAD (peripheral artery disease) ([place])   . Pneumonia   . Shortness of breath dyspnea    with exertion  . Tobacco user   . Toe fracture, right 05/09/2011    Patient Active Problem List   Diagnosis Date Noted  . Pancreatitis, acute 05/30/2018  . UTI (urinary tract infection) 05/30/2018  . Pain in both lower extremities 03/27/2018  . Symptomatic anemia 03/27/2018  . Pain of right lower extremity 12/10/2017  . Radiculopathy, cervical region 02/11/2017  . Depression 10/03/2016  . Back pain 06/24/2016  . Vision changes 02/02/2016  . Anemia 09/10/2015  . Claudication of both lower extremities ([place]) 07/15/2015  . Proteinuria 08/27/2014  . Diabetic neuropathy ([place]) 02/27/2014  . Iron deficiency anemia 08/01/2012  . Type 2 diabetes mellitus without complication ([place]) 26/71/2458  . GERD 01/14/2009  . HYPERCHOLESTEROLEMIA 02/22/2007  . OBESITY, NOS 02/22/2007  . Tobacco abuse 02/22/2007  . HYPERTENSION, BENIGN SYSTEMIC 02/22/2007  . PAD (peripheral artery disease) ([place]) 02/22/2007  . OSTEOARTHRITIS, MULTI SITES 02/22/2007    Past Surgical History:  Procedure Laterality Date  . ABDOMINAL AORTOGRAM W/LOWER EXTREMITY N/A 12/11/2017   Procedure: ABDOMINAL AORTOGRAM W/LOWER EXTREMITY;  Surgeon: Angelia Mould, MD;  Location: [facility] [facility];  Service: Cardiovascular;  Laterality: N/A;  . ANTERIOR CERVICAL DECOMP/DISCECTOMY FUSION  03/08/12   C6-7  . ANTERIOR CERVICAL DECOMP/DISCECTOMY FUSION  03/08/2012   Procedure: ANTERIOR CERVICAL DECOMPRESSION/DISCECTOMY FUSION 1 LEVEL/HARDWARE REMOVAL;  Surgeon: Erline Levine, MD;  Location: [facility]URO ORS;  Service: Neurosurgery;  Laterality: N/A;  revison of C5-7 anterior cervical decompression with fusion with Cervical Five-Thoracic One anterior cervical decompression with fusion with interbody prothesis plating and bonegraft  . BACK SURGERY  1996  . BYPASS GRAFT FEMORAL-PERONEAL Left 11/11/2016   Procedure: REDO LEFT  FEMORAL-PERONEAL BYPASS WITH PROPATEN 6MM X 80CM GRAFT;  Surgeon: Angelia Mould, MD;  Location: [facility] OR;  Service: Vascular;  Laterality: Left;  . COLONOSCOPY W/ BIOPSIES AND POLYPECTOMY  08/17/2012   f/u 5 years, 4 polyps, no high grade dysplasia or malignancy, tubular adenoma, hyperplastic polyops  . ENTEROSCOPY N/A 12/11/2015   Procedure: ENTEROSCOPY;  Surgeon: Carol Ada, MD;  Location: [facility] ENDOSCOPY;  Service: Endoscopy;  Laterality: N/A;  . ENTEROSCOPY N/A 03/29/2018   Procedure: ENTEROSCOPY;  Surgeon: Carol Ada, MD;  Location: [facility] ENDOSCOPY;  Service: Endoscopy;  Laterality: N/A;  . ESOPHAGOGASTRODUODENOSCOPY  08/17/2012   normal esophagus and GEJ, diffuse gastritis with erythema- no malignancy, reactive gastropathy  with focal intestinal metaplasia  . FEMORAL-POPLITEAL BYPASS GRAFT Left 01/06/2016   Procedure: Left  COMMON FEMORAL-BELOW KNEE POPLITEAL ARTERY Bypass using non-reversed translocated saphenous vein graft from left leg;  Surgeon: Mal Misty, MD;  Location: [facility];  Service: Vascular;  Laterality: Left;  . GIVENS CAPSULE STUDY N/A 11/24/2015   Procedure: GIVENS CAPSULE STUDY;  Surgeon: Juanita Craver, MD;  Location: [place];  Service: Endoscopy;  Laterality: N/A;  . HOT HEMOSTASIS N/A 03/29/2018   Procedure: HOT HEMOSTASIS (ARGON PLASMA COAGULATION/BICAP);  Surgeon: Carol Ada, MD;  Location: [place] [facility];  Service: Endoscopy;  Laterality: N/A;  . INGUINAL HERNIA REPAIR  1990's   right  . INTRAOPERATIVE ARTERIOGRAM Left 01/06/2016   Procedure: INTRA OPERATIVE ARTERIOGRAM LEFT LOWER LEG;  Surgeon: Mal Misty, MD;  Location: [facility];  Service: Vascular;  Laterality: Left;  . INTRAOPERATIVE ARTERIOGRAM Left 11/11/2016   Procedure: INTRA OPERATIVE ARTERIOGRAM LEFT LOWER EXTRIMITY;  Surgeon: Angelia Mould, MD;  Location: [facility];  Service: Vascular;  Laterality: Left;  . IR ANGIOGRAM FOLLOW UP STUDY  12/11/2017  . IR GENERIC HISTORICAL  10/24/2016   IR ANGIOGRAM  FOLLOW UP STUDY  . LOWER EXTREMITY ANGIOGRAM Bilateral 07/30/2015   Procedure: Lower Extremity Angiogram;  Surgeon: Conrad Stateline, MD;  Location: [facility] CV LAB;  Service: Cardiovascular;  Laterality: Bilateral;  . [place]   "lower"  . PERIPHERAL VASCULAR CATHETERIZATION N/A 07/30/2015   Procedure: Abdominal Aortogram;  Surgeon: Conrad Connerville, MD;  Location: [facility] CV LAB;  Service: Cardiovascular;  Laterality: N/A;  . PERIPHERAL VASCULAR CATHETERIZATION N/A 10/24/2016   Procedure: Abdominal Aortogram;  Surgeon: Angelia Mould, MD;  Location: [place] [facility] LAB;  Service: Cardiovascular;  Laterality: N/A;  . PERIPHERAL VASCULAR CATHETERIZATION N/A 10/24/2016   Procedure: Lower Extremity Angiography;  Surgeon: Angelia Mould, MD;  Location: [facility] CV LAB;  Service: Cardiovascular;  Laterality: N/A;  . PERIPHERAL VASCULAR INTERVENTION Right 12/11/2017   Procedure: PERIPHERAL VASCULAR INTERVENTION;  Surgeon: Angelia Mould, MD;  Location: [facility] CV LAB;  Service: Cardiovascular;  Laterality: Right;  Marland Kitchen VASCULAR SURGERY  ~ 2007   Stent SFA   . VEIN HARVEST Left 01/06/2016   Procedure: LEFT GREATER SAPPHENOUS VEIN HARVEST;  Surgeon: Mal Misty, MD;  Location: [facility];  Service: Vascular;  Laterality: Left;  Home Medications    Prior to Admission medications   Medication Sig Start Date End Date Taking? Authorizing Provider  acetaminophen (TYLENOL) 500 MG tablet Take 1,000 mg by mouth 3 (three) times daily as needed for moderate pain.   Yes [provider]  atorvastatin (LIPITOR) 40 MG tablet Take 1 tablet (40 mg total) by mouth daily. 02/06/18  Yes Rogue Bussing, MD  benazepril (LOTENSIN) 5 MG tablet Take 1 tablet (5 mg total) by mouth daily. 12/10/17  Yes Rogue Bussing, MD  BYSTOLIC 20 MG TABS TAKE 1 TABLET BY MOUTH EVERY DAY 01/02/18  Yes Rogue Bussing, MD  calcium carbonate (TUMS EX) 750 MG  chewable tablet Chew 2 tablets by mouth as needed for heartburn.   Yes [provider]  cephALEXin (KEFLEX) 500 MG capsule Take 1 capsule (500 mg total) by mouth 4 (four) times daily. 05/29/18  Yes Muthersbaugh, Jarrett Soho, PA-C  clopidogrel (PLAVIX) 75 MG tablet Take 1 tablet (75 mg total) by mouth daily. 05/30/18  Yes Chambliss, Jeb Levering, MD  HYDROcodone-acetaminophen (NORCO/VICODIN) 5-325 MG tablet Take 1-2 tablets by mouth every 6 (six) hours as needed. 05/30/18  Yes Lind Covert, MD  metFORMIN (GLUCOPHAGE) 1000 MG tablet Take 1 tablet (1,000 mg total) by mouth 2 (two) times daily with a meal. 12/10/17  Yes Rogue Bussing, MD  ondansetron [facility] ODT) 4 MG disintegrating tablet 4mg  ODT q4 hours prn nausea/vomit 05/29/18  Yes Muthersbaugh, Jarrett Soho, PA-C  pantoprazole (PROTONIX) 40 MG tablet Take 1 tablet (40 mg total) by mouth daily. 12/10/17  Yes Rogue Bussing, MD  polyethylene glycol [facility] / Floria Raveling) packet Take 17 g by mouth daily. 03/31/18  Yes Everrett Coombe, MD  senna (SENOKOT) 8.6 MG TABS tablet Take 1 tablet (8.6 mg total) by mouth at bedtime. 03/30/18  Yes Everrett Coombe, MD  dextromethorphan-guaiFENesin [facility] DM) 30-600 MG 12hr tablet Take 1 tablet by mouth 2 (two) times daily as needed for cough. Patient not taking: Reported on 05/28/2018 04/01/17   Leo Grosser, MD  docusate sodium (COLACE) 100 MG capsule Take 1 capsule (100 mg total) by mouth 2 (two) times daily. Patient not taking: Reported on 03/27/2018 12/08/16   McKeag, Marylynn Pearson, MD  ezetimibe (ZETIA) 10 MG tablet Take 1 tablet (10 mg total) by mouth daily. Patient not taking: Reported on 06/10/2018 12/10/17   Rogue Bussing, MD  ferrous sulfate 325 (65 FE) MG tablet Take 1 tablet (325 mg total) by mouth daily. 03/30/18 03/25/19  Kathrene Alu, MD  FLUoxetine (PROZAC) 10 MG capsule Take 1 capsule (10 mg total) by mouth daily. Patient not taking: Reported on 05/28/2018 12/10/17   Rogue Bussing, MD  gabapentin (NEURONTIN) 800 MG tablet TAKE 1.5 TABLETS (1,200 MG TOTAL) BY MOUTH 3 (THREE) TIMES DAILY. Patient not taking: Reported on 06/10/2018 11/16/16   McKeag, Marylynn Pearson, MD  HYDROcodone-acetaminophen (NORCO/VICODIN) 5-325 MG tablet Take 1-2 tablets by mouth every 6 (six) hours as needed. 06/10/18   Volanda Napoleon, PA-C  Vitamin D, Ergocalciferol, (DRISDOL) 50000 units CAPS capsule Take 1 capsule once weekly for the next 8 weeks for low vitamin D. After that, take 2,000 units daily of vitamin D (over the counter). Patient not taking: Reported on 06/10/2018 04/06/18   Rogue Bussing, MD    Family History Family History  Problem Relation Age of Onset  . Cancer Mother        colon Cancer  . Hyperlipidemia Mother   . Diabetes Mother   .  Heart disease Father   . Hypertension Father   . Cancer Sister        Uterine  . Diabetes Sister   . Diabetes Brother     Social History Social History   Tobacco Use  . Smoking status: Current Some Day Smoker    Years: 24.00    Types: Cigars  . Smokeless tobacco: Never Used  . Tobacco comment: smokes 1-2 cigars per day   Substance Use Topics  . Alcohol use: Yes    Alcohol/week: 1.2 oz    Types: 2 Cans of beer per week  . Drug use: No     Allergies   Glipizide   Review of Systems Review of Systems  Constitutional: Negative for fever.  Respiratory: Negative for cough and shortness of breath.   Cardiovascular: Negative for chest pain.  Gastrointestinal: Positive for abdominal pain, constipation and nausea. Negative for vomiting.  Genitourinary: Negative for dysuria and hematuria.  Neurological: Negative for headaches.  All other systems reviewed and are negative.    Physical Exam Updated Vital Signs BP (!) 155/73 (BP Location: Left Arm)   Pulse 67   Temp 98.8 F (37.1 C) (Rectal)   Resp 18   SpO2 99%   Physical Exam  Constitutional: He is oriented to person, place, and time. He appears well-developed and  well-nourished.  HENT:  Head: Normocephalic and atraumatic.  Mouth/Throat: Oropharynx is clear and moist and mucous membranes are normal.  Eyes: Pupils are equal, round, and reactive to light. Conjunctivae, EOM and lids are normal.  Neck: Full passive range of motion without pain.  Cardiovascular: Normal rate, regular rhythm, normal heart sounds and normal pulses. Exam reveals no gallop and no friction rub.  No murmur heard. Pulmonary/Chest: Effort normal and breath sounds normal.  Lungs clear to auscultation bilaterally.  Symmetric chest rise.  No wheezing, rales, rhonchi.  Abdominal: Soft. Normal appearance. There is tenderness in the right upper quadrant. There is no rigidity, no guarding and no CVA tenderness. No hernia. Hernia confirmed negative in the right inguinal area and confirmed negative in the left inguinal area.  Abdomen soft, nondistended.  Tenderness noted to the right upper quadrant.  No rigidity.  Genitourinary: Rectum normal, prostate normal, testes normal and penis normal. Prostate is not enlarged and not tender. Right testis shows no swelling and no tenderness. Right testis is descended. Left testis shows no swelling and no tenderness. Left testis is descended. Uncircumcised.  Genitourinary Comments: The exam was performed with a chaperone present. Normal male genitalia. No evidence of rash, ulcers or lesions.  Bilateral testicles are without erythema, edema, tenderness.   Musculoskeletal: Normal range of motion.  Neurological: He is alert and oriented to person, place, and time.  Skin: Skin is warm and dry. Capillary refill takes less than 2 seconds.  Psychiatric: He has a normal mood and affect. His speech is normal.  Nursing note and vitals reviewed.    ED Treatments / Results  Labs (all labs ordered are listed, but only abnormal results are displayed) Labs Reviewed  LIPASE, BLOOD - Abnormal; Notable for the following components:      Result Value   Lipase 88 (*)      All other components within normal limits  COMPREHENSIVE METABOLIC PANEL - Abnormal; Notable for the following components:   Sodium 132 (*)    Chloride 99 (*)    Glucose, Bld 164 (*)    Total Protein 8.2 (*)    ALT 12 (*)  All other components within normal limits  CBC - Abnormal; Notable for the following components:   RBC 6.66 (*)    MCV 72.5 (*)    MCH 21.8 (*)    RDW 19.5 (*)    All other components within normal limits  URINALYSIS, ROUTINE W REFLEX MICROSCOPIC - Abnormal; Notable for the following components:   APPearance HAZY (*)    Specific Gravity, Urine 1.031 (*)    Ketones, ur 20 (*)    Leukocytes, UA TRACE (*)    All other components within normal limits    EKG None  Radiology US Abdomen Limited Ruq  Result Date: 06/10/2018 CLINICAL DATA:  Right upper quadrant pain for 3 weeks. EXAM: ULTRASOUND ABDOMEN LIMITED RIGHT UPPER QUADRANT COMPARISON:  CT abdomen and pelvis 05/28/2018. FINDINGS: Gallbladder: No gallstones or wall thickening visualized. No sonographic Murphy sign noted by sonographer. Common bile duct: Diameter: 0.3 cm Liver: No focal lesion. Echogenicity is somewhat increased consistent with fatty infiltration. Portal vein is patent on color Doppler imaging with normal direction of blood flow towards the liver. Simple right renal cyst measuring 4.7 cm is noted as seen on prior CT. IMPRESSION: No acute abnormality.  Negative for gallstones. Mild appearing fatty infiltration of the liver. Electronically Signed   By: Inge Rise M.D.   On: 06/10/2018 13:02    Procedures Procedures (including critical care time)  Medications Ordered in ED Medications  morphine 4 MG/ML injection 4 mg (4 mg Intravenous Given 06/10/18 0919)  sodium chloride 0.9 % bolus 500 mL (0 mLs Intravenous Stopped 06/10/18 1156)  fentaNYL (SUBLIMAZE) injection 50 mcg (50 mcg Intravenous Given 06/10/18 1315)  HYDROmorphone (DILAUDID) injection 0.5 mg (0.5 mg Intravenous Given 06/10/18  1521)     Initial Impression / Assessment and Plan / ED Course  I have reviewed the triage vital signs and the nursing notes.  Pertinent labs & imaging results that were available during my care of the patient were reviewed by me and considered in my medical decision making (see chart for details).     67 y.o. male who presents for evaluation of right upper quadrant abdominal pain that is worsened over the last 3 days.  Associate with nausea constipation.  No fevers, chest pain, difficulty urinating.  Seen in the ED last week for evaluation of upper abdominal pain and was diagnosed with pancreatitis and UTI.  Reports pain medication ran out a few days ago prior to onset of worsening pain. Patient is afebrile, non-toxic appearing, sitting comfortably on examination table. Vital signs reviewed and stable.  Abdomen shows tenderness noted to the right upper quadrant.  No rigidity.  GU exam shows no normalities.  Exam is not concerning for testicular torsion, epididymitis..  Consider pancreatitis versus hepatobiliary etiology versus infectious etiology.  Also consider small bowel obstruction, the low suspicion given history/physical exam.  Plan to check basic labs.   CMP shows sodium 132, chloride 99, otherwise unremarkable.  CBC with no significant leukocytosis, anemia.  UA shows trace leukocytes. Improvement from previous UA.  Lipase is elevated at 88.  This is up from his previous 21.  Ultrasound reveals no evidence of cholecystitis.  No gallstones that would be causing pancreatitis.  Discussed results with patient.  He reports improvement in his pain after second round of analgesics.  Plan to p.o. challenge patient.  At this time unclear etiology of decreased tightness.  No medication that would be indicative of causing it.  Patient does not report a history of alcohol use.  Re-evaluation: reports some return of his pain.  Will give additional analgesics.  Patient reports improved pain.  We will  plan to p.o. challenge patient.  Patient will tolerate p.o. without any nausea/vomiting.  He does report some return of pain.  Will give additional analgesics.  At this time, patients RANSOM score is 1.  Given presentation, reassuring work-up, feel that patient does not meet inpatient requirement for admission at this time.  Reevaluation after analgesics.  Patient reports improvement in pain.  Vital signs are stable.  Repeat abdominal exam shows improvement in tenderness.  I discussed with patient at length regarding discharge versus admission.  At this time, patient is tolerating p.o., he does not appear clinically dehydrated on work-up and lipase is only slightly elevated from previous visit.  Given improvement in pain, feel the patient can be discharged home.  He is agreeable to plan.  Given that we do not know what is causing parotitis, will recommend follow-up with outpatient GI for further evaluation. Patient had ample opportunity for questions and discussion. All patient's questions were answered with full understanding. Strict return precautions discussed. Patient expresses understanding and agreement to plan.    Final Clinical Impressions(s) / ED Diagnoses   Final diagnoses:  RUQ abdominal pain  Acute pancreatitis, unspecified complication status, unspecified pancreatitis type    ED Discharge Orders        Ordered    HYDROcodone-acetaminophen (NORCO/VICODIN) 5-325 MG tablet  Every 6 hours PRN     06/10/18 1557       Volanda Napoleon, PA-C 06/10/18 [place], Ankit, MD 06/11/18 [phone]

## 2018-06-10 NOTE — ED Notes (Signed)
Pt given water for Fluid/ PO challenge.

## 2018-06-10 NOTE — ED Triage Notes (Signed)
Pt reports RUQ pain that radiates down to his groin x3 days. Sates he had the same pain last week and was told it was his pancreas and sent him home with pain meds but states he finished them Wednesday. Denies cp or sob, nad.

## 2018-06-12 DIAGNOSIS — R1033 Periumbilical pain: Secondary | ICD-10-CM | POA: Diagnosis not present

## 2018-06-12 DIAGNOSIS — K5903 Drug induced constipation: Secondary | ICD-10-CM | POA: Diagnosis not present

## 2018-06-12 DIAGNOSIS — R933 Abnormal findings on diagnostic imaging of other parts of digestive tract: Secondary | ICD-10-CM | POA: Diagnosis not present

## 2018-06-12 DIAGNOSIS — Z86010 Personal history of colonic polyps: Secondary | ICD-10-CM | POA: Diagnosis not present

## 2018-06-12 DIAGNOSIS — K760 Fatty (change of) liver, not elsewhere classified: Secondary | ICD-10-CM | POA: Diagnosis not present

## 2018-07-11 ENCOUNTER — Ambulatory Visit (HOSPITAL_COMMUNITY)
Admission: RE | Admit: 2018-07-11 | Discharge: 2018-07-11 | Disposition: A | Discharge status: Home / Self Care | Payer: Medicare Other | Source: Ambulatory Visit | Attending: Vascular Surgery | Admitting: Vascular Surgery

## 2018-07-11 ENCOUNTER — Encounter: Payer: Self-pay | Admitting: Vascular Surgery

## 2018-07-11 ENCOUNTER — Ambulatory Visit (INDEPENDENT_AMBULATORY_CARE_PROVIDER_SITE_OTHER)
Admission: RE | Admit: 2018-07-11 | Discharge: 2018-07-11 | Disposition: A | Discharge status: Home / Self Care | Payer: Medicare Other | Source: Ambulatory Visit | Attending: Vascular Surgery | Admitting: Vascular Surgery

## 2018-07-11 ENCOUNTER — Other Ambulatory Visit: Payer: Self-pay | Admitting: Vascular Surgery

## 2018-07-11 ENCOUNTER — Ambulatory Visit (INDEPENDENT_AMBULATORY_CARE_PROVIDER_SITE_OTHER): Payer: Medicare Other | Admitting: Vascular Surgery

## 2018-07-11 VITALS — BP 143/78 | HR 56 | Temp 97.3°F | Resp 16 | Ht 67.0 in | Wt 184.0 lb

## 2018-07-11 DIAGNOSIS — M79604 Pain in right leg: Secondary | ICD-10-CM | POA: Diagnosis not present

## 2018-07-11 DIAGNOSIS — Z95820 Peripheral vascular angioplasty status with implants and grafts: Secondary | ICD-10-CM | POA: Insufficient documentation

## 2018-07-11 DIAGNOSIS — E1151 Type 2 diabetes mellitus with diabetic peripheral angiopathy without gangrene: Secondary | ICD-10-CM | POA: Insufficient documentation

## 2018-07-11 DIAGNOSIS — I739 Peripheral vascular disease, unspecified: Secondary | ICD-10-CM

## 2018-07-11 DIAGNOSIS — E785 Hyperlipidemia, unspecified: Secondary | ICD-10-CM | POA: Diagnosis not present

## 2018-07-11 DIAGNOSIS — I2510 Atherosclerotic heart disease of native coronary artery without angina pectoris: Secondary | ICD-10-CM | POA: Diagnosis not present

## 2018-07-11 DIAGNOSIS — I10 Essential (primary) hypertension: Secondary | ICD-10-CM | POA: Insufficient documentation

## 2018-07-11 NOTE — Progress Notes (Signed)
Patient name: [name] MRN: [id number] DOB: 1951-02-04 Sex: male  REASON FOR VISIT:   Follow-up of peripheral vascular disease.  HPI:   [name] is a pleasant 67 y.o. male who most recently underwent angioplasty and stenting of the right external iliac artery on 12/11/2017.  When I saw him last on 01/10/2018, his symptoms in the right leg had improved significantly.  He had a good result from his angioplasty and stenting of a 90% right external iliac artery stenosis with a covered stent.  On the right below that he had superficial femoral artery occlusive disease with reconstitution of the peroneal artery and the right leg only.  On the left side he has a patent prosthetic femoral to peroneal artery bypass graft which was widely patent by arteriography.  He comes in for six-month follow-up visit.  Since I saw him last, he does continue to have some calf claudication bilaterally.  He symptoms are brought on by ambulation and relieved with rest.  This occurs at about 20 yards.  His symptoms are equal on both sides.  He also gets some pain in his toes at night but this does not sound like true rest pain.  He denies any history of nonhealing ulcers.  He smokes cigars.  Past Medical History:  Diagnosis Date  . Anemia   . Arthritis    OA  . Cervical radiculopathy    Dr. Vertell Limber neurosurgery  . Chronic lower back pain   . Diabetes mellitus    takes Metformin daily  . GERD (gastroesophageal reflux disease)    takes Protonix daily  . History of blood transfusion    "related to low HgB" ((09/10/2015  . Hyperlipidemia    takes Vytorin daily  . Hypertension    takes Benazepril and Bystolic daily  . PAD (peripheral artery disease) ([place])   . Pneumonia   . Shortness of breath dyspnea    with exertion  . Tobacco user   . Toe fracture, right 05/09/2011    Family History  Problem Relation Age of Onset  . Cancer Mother        colon Cancer  . Hyperlipidemia Mother   . Diabetes  Mother   . Heart disease Father   . Hypertension Father   . Cancer Sister        Uterine  . Diabetes Sister   . Diabetes Brother     SOCIAL HISTORY: Social History   Tobacco Use  . Smoking status: Current Some Day Smoker    Years: 24.00    Types: Cigars  . Smokeless tobacco: Never Used  . Tobacco comment: smokes 1-2 cigars per day   Substance Use Topics  . Alcohol use: Yes    Alcohol/week: 1.2 oz    Types: 2 Cans of beer per week    Allergies  Allergen Reactions  . Glipizide Other (See Comments)    REACTION IS SIDE EFFECT Severe hypoglycemia to 40s.     Current Outpatient Medications  Medication Sig Dispense Refill  . atorvastatin (LIPITOR) 40 MG tablet Take 1 tablet (40 mg total) by mouth daily. 90 tablet 1  . benazepril (LOTENSIN) 5 MG tablet Take 1 tablet (5 mg total) by mouth daily. 90 tablet 2  . BYSTOLIC 20 MG TABS TAKE 1 TABLET BY MOUTH EVERY DAY 30 tablet 0  . cephALEXin (KEFLEX) 500 MG capsule Take 1 capsule (500 mg total) by mouth 4 (four) times daily. 40 capsule 0  . clopidogrel (PLAVIX) 75 MG  tablet Take 1 tablet (75 mg total) by mouth daily. 90 tablet 0  . docusate sodium (COLACE) 100 MG capsule Take 1 capsule (100 mg total) by mouth 2 (two) times daily. 60 capsule 2  . ezetimibe (ZETIA) 10 MG tablet Take 1 tablet (10 mg total) by mouth daily. 90 tablet 2  . FLUoxetine (PROZAC) 10 MG capsule Take 1 capsule (10 mg total) by mouth daily. 90 capsule 2  . gabapentin (NEURONTIN) 800 MG tablet TAKE 1.5 TABLETS (1,200 MG TOTAL) BY MOUTH 3 (THREE) TIMES DAILY. 135 tablet 11  . metFORMIN (GLUCOPHAGE) 1000 MG tablet Take 1 tablet (1,000 mg total) by mouth 2 (two) times daily with a meal. 180 tablet 2  . pantoprazole (PROTONIX) 40 MG tablet Take 1 tablet (40 mg total) by mouth daily. 90 tablet 2  . Vitamin D, Ergocalciferol, (DRISDOL) 50000 units CAPS capsule Take 1 capsule once weekly for the next 8 weeks for low vitamin D. After that, take 2,000 units daily of vitamin  D (over the counter). 8 capsule 0  . acetaminophen (TYLENOL) 500 MG tablet Take 1,000 mg by mouth 3 (three) times daily as needed for moderate pain.    . calcium carbonate (TUMS EX) 750 MG chewable tablet Chew 2 tablets by mouth as needed for heartburn.    . dextromethorphan-guaiFENesin (MUCINEX DM) 30-600 MG 12hr tablet Take 1 tablet by mouth 2 (two) times daily as needed for cough. (Patient not taking: Reported on 07/11/2018) 30 tablet 0  . ferrous sulfate 325 (65 FE) MG tablet Take 1 tablet (325 mg total) by mouth daily. (Patient not taking: Reported on 07/11/2018) 30 tablet 11  . HYDROcodone-acetaminophen (NORCO/VICODIN) 5-325 MG tablet Take 1-2 tablets by mouth every 6 (six) hours as needed. (Patient not taking: Reported on 07/11/2018) 15 tablet 0  . HYDROcodone-acetaminophen (NORCO/VICODIN) 5-325 MG tablet Take 1-2 tablets by mouth every 6 (six) hours as needed. (Patient not taking: Reported on 07/11/2018) 10 tablet 0  . ondansetron (ZOFRAN ODT) 4 MG disintegrating tablet 4mg  ODT q4 hours prn nausea/vomit (Patient not taking: Reported on 07/11/2018) 6 tablet 0  . polyethylene glycol (MIRALAX / GLYCOLAX) packet Take 17 g by mouth daily. (Patient not taking: Reported on 07/11/2018) 14 each 0  . senna (SENOKOT) 8.6 MG TABS tablet Take 1 tablet (8.6 mg total) by mouth at bedtime. (Patient not taking: Reported on 07/11/2018) 120 each 0   No current facility-administered medications for this visit.     REVIEW OF SYSTEMS:  [X]  denotes positive finding, [ ]  denotes negative finding Cardiac  Comments:  Chest pain or chest pressure:    Shortness of breath upon exertion:    Short of breath when lying flat:    Irregular heart rhythm:        Vascular    Pain in calf, thigh, or hip brought on by ambulation:    Pain in feet at night that wakes you up from your sleep:     Blood clot in your veins:    Leg swelling:         Pulmonary    Oxygen at home:    Productive cough:     Wheezing:           Neurologic    Sudden weakness in arms or legs:     Sudden numbness in arms or legs:     Sudden onset of difficulty speaking or slurred speech:    Temporary loss of vision in one eye:     Problems  with dizziness:         Gastrointestinal    Blood in stool:     Vomited blood:         Genitourinary    Burning when urinating:     Blood in urine:        Psychiatric    Major depression:         Hematologic    Bleeding problems:    Problems with blood clotting too easily:        Skin    Rashes or ulcers:        Constitutional    Fever or chills:     PHYSICAL EXAM:   Vitals:   07/11/18 1233  BP: (!) 143/78  Pulse: (!) 56  Resp: 16  Temp: (!) 97.3 F (36.3 C)  TempSrc: Oral  SpO2: 100%  Weight: 184 lb (83.5 kg)  Height: 5\' 7"  (1.702 m)    GENERAL: The patient is a well-nourished male, in no acute distress. The vital signs are documented above. CARDIAC: There is a regular rate and rhythm.  VASCULAR: I do not detect carotid bruits. He has a diminished right femoral pulse.  He has a palpable left femoral pulse.  I cannot palpate pedal pulses however both feet are warm and well-perfused. On the right side he has some varicose veins and reticular veins in the anterior medial right leg. On the left side he has hyperpigmentation, mild leg swelling, and lipodermatosclerosis. PULMONARY: There is good air exchange bilaterally without wheezing or rales. ABDOMEN: Soft and non-tender with normal pitched bowel sounds.  MUSCULOSKELETAL: There are no major deformities or cyanosis. NEUROLOGIC: No focal weakness or paresthesias are detected. SKIN: There are no ulcers or rashes noted. PSYCHIATRIC: The patient has a normal affect.  DATA:    LEFT LOWER EXTREMITY DUPLEX: I have independently interpreted his left lower extremity arterial duplex.  His bypass graft is patent with biphasic flow throughout and no areas of stenosis noted.  DUPLEX OF THE RIGHT EXTERNAL ILIAC ARTERY STENT: I  have independently interpreted his duplex of his stent on the right side.  The stent is widely patent with triphasic flow throughout.  ARTERIAL DOPPLER STUDY: I have independently interpreted his arterial Doppler study today.  On the right side he has a monophasic dorsalis pedis and posterior tibial signal.  ABI is 62%.  On the left side he has a biphasic posterior tibial and dorsalis pedis signal.  ABI on the left is 100% with a toe pressure of 98.  MEDICAL ISSUES:   PERIPHERAL VASCULAR DISEASE: This patient has a patent right external iliac artery stent.  He has infrainguinal arterial occlusive disease on the right with stable symptoms.  On the left side he has a patent femoral to peroneal artery bypass with a prosthetic graft.  ABI is 100% on the left.  He has peroneal runoff only on the left.  I have encouraged him to stay as active as possible.  We have again discussed the importance of tobacco cessation.  I will see him back in 6 months with follow-up studies.  CHRONIC VENOUS INSUFFICIENCY: This patient has CEAP  Clinical class IVb venous disease.  I have discussed with him the importance of intermittent leg elevation the proper positioning for this.  I also encouraged him to avoid prolonged sitting and standing and to exercise and walk as much as possible.  Deitra Mayo [facility] [phone]

## 2018-08-02 ENCOUNTER — Other Ambulatory Visit: Payer: Self-pay | Admitting: Internal Medicine

## 2018-08-03 ENCOUNTER — Other Ambulatory Visit: Payer: Self-pay | Admitting: Student in an Organized Health Care Education/Training Program

## 2018-08-03 ENCOUNTER — Other Ambulatory Visit: Payer: Self-pay | Admitting: Internal Medicine

## 2018-08-03 DIAGNOSIS — E7800 Pure hypercholesterolemia, unspecified: Secondary | ICD-10-CM

## 2018-08-03 MED ORDER — ATORVASTATIN CALCIUM 40 MG PO TABS: 40.0000 mg | ORAL_TABLET | Freq: Every day | ORAL | 1 refills | Status: DC

## 2018-08-06 ENCOUNTER — Other Ambulatory Visit: Payer: Self-pay

## 2018-08-06 DIAGNOSIS — I70221 Atherosclerosis of native arteries of extremities with rest pain, right leg: Secondary | ICD-10-CM

## 2018-08-06 DIAGNOSIS — I739 Peripheral vascular disease, unspecified: Secondary | ICD-10-CM

## 2018-08-11 ENCOUNTER — Other Ambulatory Visit: Payer: Self-pay | Admitting: Internal Medicine

## 2018-08-14 NOTE — Telephone Encounter (Signed)
Hey, I already refilled this prescription on 8/9 at the pharmacy we have on file for him. But this prescription request looks like it's for [place]? Maybe the patient moved in which case he needs a new PCP

## 2018-08-20 ENCOUNTER — Other Ambulatory Visit: Payer: Self-pay | Admitting: Internal Medicine

## 2018-08-21 NOTE — Telephone Encounter (Signed)
This prescription request is for TEN refills and for a pharmacy in [place]. I already denied this request several times. I refilled the medication to the pharmacy we have on file which is local, but if he is moved to [place], he is going to have to get another PCP. I'm not going to refill this prescription. Can you please clarify what is going on with this request?

## 2018-08-23 NOTE — Telephone Encounter (Signed)
Spoke with pt and he said the pharmacy in [place] is a mail order. So he is ok with getting his rx from there. Deseree Kennon Holter, CMA

## 2018-08-24 ENCOUNTER — Other Ambulatory Visit: Payer: Self-pay | Admitting: Student in an Organized Health Care Education/Training Program

## 2018-08-24 NOTE — Telephone Encounter (Signed)
I already refilled this medication on 08/03/2018

## 2018-08-24 NOTE — Telephone Encounter (Signed)
To [facility] on [address]

## 2018-08-28 ENCOUNTER — Ambulatory Visit: Payer: Medicare Other | Admitting: Family Medicine

## 2018-08-30 NOTE — Telephone Encounter (Signed)
Pt informed. Raegan Winders, CMA  

## 2018-08-31 ENCOUNTER — Other Ambulatory Visit: Payer: Self-pay | Admitting: Internal Medicine

## 2018-08-31 DIAGNOSIS — I10 Essential (primary) hypertension: Secondary | ICD-10-CM

## 2018-09-07 ENCOUNTER — Other Ambulatory Visit: Payer: Self-pay | Admitting: *Deleted

## 2018-09-07 DIAGNOSIS — I10 Essential (primary) hypertension: Secondary | ICD-10-CM

## 2018-09-07 NOTE — Telephone Encounter (Signed)
Sorry didn't notice that.  I contacted [facility], they only received the lipitor on 09/03/18.   I called in the other verbally as directed on other scripts from 09/03/18. Veanna Dower, Salome Spotted, CMA

## 2018-09-07 NOTE — Telephone Encounter (Signed)
Hey, I'm confused by this pharmacy. I just refilled 5/6 of these prescriptions 4 days ago. I'm now getting a request to refill them again now. I called the patient and he said he still has a month supply so he's not in dire need to get these medications right now but could you reach out to the pharmacy and see what their reason is for this? The patient doesn't know why they are requesting them again either.

## 2018-09-26 ENCOUNTER — Encounter: Payer: Self-pay | Admitting: Student in an Organized Health Care Education/Training Program

## 2018-09-26 DIAGNOSIS — E119 Type 2 diabetes mellitus without complications: Secondary | ICD-10-CM | POA: Diagnosis not present

## 2018-09-26 DIAGNOSIS — H2513 Age-related nuclear cataract, bilateral: Secondary | ICD-10-CM | POA: Diagnosis not present

## 2018-09-26 DIAGNOSIS — H3509 Other intraretinal microvascular abnormalities: Secondary | ICD-10-CM | POA: Diagnosis not present

## 2018-09-26 DIAGNOSIS — H35033 Hypertensive retinopathy, bilateral: Secondary | ICD-10-CM | POA: Diagnosis not present

## 2018-10-01 ENCOUNTER — Telehealth: Payer: Self-pay

## 2018-10-01 NOTE — Telephone Encounter (Signed)
Pt LVM on nurse line stating he was returning "someones phone call." I didn't see any notes in chart. Will forward to PCP to see if she has tried to reach out to him.

## 2018-10-02 NOTE — Telephone Encounter (Signed)
I do not recall calling this patient. I did refill a medication this week.

## 2018-11-08 ENCOUNTER — Ambulatory Visit (INDEPENDENT_AMBULATORY_CARE_PROVIDER_SITE_OTHER): Payer: Medicare Other

## 2018-11-08 ENCOUNTER — Telehealth: Payer: Self-pay

## 2018-11-08 VITALS — BP 132/64 | HR 63 | Temp 98.4°F | Ht 67.0 in | Wt 194.0 lb

## 2018-11-08 DIAGNOSIS — Z23 Encounter for immunization: Secondary | ICD-10-CM

## 2018-11-08 DIAGNOSIS — Z0000 Encounter for general adult medical examination without abnormal findings: Secondary | ICD-10-CM

## 2018-11-08 NOTE — Patient Instructions (Addendum)
[name] , Thank you for taking time to come for your Medicare Wellness Visit. I appreciate your ongoing commitment to your health goals. Please review the following plan we discussed and let me know if I can assist you in the future.   Today you received your flu vaccine. Please keep your appointment with your new doctor, Dr. Ouida Sills, on 12/04/2018 at 8:45 am.  These are the goals we discussed: Goals    . HEMOGLOBIN A1C < 7.0    . Increase water intake    . Reduce sugar intake to X grams per day     Dilute sweet tea with half unsweet       This is a list of the screening recommended for you and due dates:  Health Maintenance  Topic Date Due  . Flu Shot  07/26/2018  . Hemoglobin A1C  09/25/2018  . Complete foot exam   12/06/2018  . Eye exam for diabetics  09/26/2019  . Colon Cancer Screening  12/27/2019  . Tetanus Vaccine  01/17/2022  .  Hepatitis C: One time screening is recommended by Center for Disease Control  (CDC) for  adults born from [birth year] through 1966.   Completed  . Pneumonia vaccines  Completed    Diabetes and Foot Care Diabetes may cause you to have problems because of poor blood supply (circulation) to your feet and legs. This may cause the skin on your feet to become thinner, break easier, and heal more slowly. Your skin may become dry, and the skin may peel and crack. You may also have nerve damage in your legs and feet causing decreased feeling in them. You may not notice minor injuries to your feet that could lead to infections or more serious problems. Taking care of your feet is one of the most important things you can do for yourself. Follow these instructions at home:  Wear shoes at all times, even in the house. Do not go barefoot. Bare feet are easily injured.  Check your feet daily for blisters, cuts, and redness. If you cannot see the bottom of your feet, use a mirror or ask someone for help.  Wash your feet with warm water (do not use hot water) and mild  soap. Then pat your feet and the areas between your toes until they are completely dry. Do not soak your feet as this can dry your skin.  Apply a moisturizing lotion or petroleum jelly (that does not contain alcohol and is unscented) to the skin on your feet and to dry, brittle toenails. Do not apply lotion between your toes.  Trim your toenails straight across. Do not dig under them or around the cuticle. File the edges of your nails with an emery board or nail file.  Do not cut corns or calluses or try to remove them with medicine.  Wear clean socks or stockings every day. Make sure they are not too tight. Do not wear knee-high stockings since they may decrease blood flow to your legs.  Wear shoes that fit properly and have enough cushioning. To break in new shoes, wear them for just a few hours a day. This prevents you from injuring your feet. Always look in your shoes before you put them on to be sure there are no objects inside.  Do not cross your legs. This may decrease the blood flow to your feet.  If you find a minor scrape, cut, or break in the skin on your feet, keep it and the skin  around it clean and dry. These areas may be cleansed with mild soap and water. Do not cleanse the area with peroxide, alcohol, or iodine.  When you remove an adhesive bandage, be sure not to damage the skin around it.  If you have a wound, look at it several times a day to make sure it is healing.  Do not use heating pads or hot water bottles. They may burn your skin. If you have lost feeling in your feet or legs, you may not know it is happening until it is too late.  Make sure your health care provider performs a complete foot exam at least annually or more often if you have foot problems. Report any cuts, sores, or bruises to your health care provider immediately. Contact a health care provider if:  You have an injury that is not healing.  You have cuts or breaks in the skin.  You have an ingrown  nail.  You notice redness on your legs or feet.  You feel burning or tingling in your legs or feet.  You have pain or cramps in your legs and feet.  Your legs or feet are numb.  Your feet always feel cold. Get help right away if:  There is increasing redness, swelling, or pain in or around a wound.  There is a red line that goes up your leg.  Pus is coming from a wound.  You develop a fever or as directed by your health care provider.  You notice a bad smell coming from an ulcer or wound. This information is not intended to replace advice given to you by your health care provider. Make sure you discuss any questions you have with your health care provider. Document Released: 12/09/2000 Document Revised: 05/19/2016 Document Reviewed: 05/21/2013 Elsevier Interactive Patient Education  2017 [place] Prevention in the Home Falls can cause injuries. They can happen to people of all ages. There are many things you can do to make your home safe and to help prevent falls. What can I do on the outside of my home?  Regularly fix the edges of walkways and driveways and fix any cracks.  Remove anything that might make you trip as you walk through a door, such as a raised step or threshold.  Trim any bushes or trees on the path to your home.  Use bright outdoor lighting.  Clear any walking paths of anything that might make someone trip, such as rocks or tools.  Regularly check to see if handrails are loose or broken. Make sure that both sides of any steps have handrails.  Any raised decks and porches should have guardrails on the edges.  Have any leaves, snow, or ice cleared regularly.  Use sand or salt on walking paths during winter.  Clean up any spills in your garage right away. This includes oil or grease spills. What can I do in the bathroom?  Use night lights.  Install grab bars by the toilet and in the tub and shower. Do not use towel bars as grab  bars.  Use non-skid mats or decals in the tub or shower.  If you need to sit down in the shower, use a plastic, non-slip stool.  Keep the floor dry. Clean up any water that spills on the floor as soon as it happens.  Remove soap buildup in the tub or shower regularly.  Attach bath mats securely with double-sided non-slip rug tape.  Do not have throw rugs  and other things on the floor that can make you trip. What can I do in the bedroom?  Use night lights.  Make sure that you have a light by your bed that is easy to reach.  Do not use any sheets or blankets that are too big for your bed. They should not hang down onto the floor.  Have a firm chair that has side arms. You can use this for support while you get dressed.  Do not have throw rugs and other things on the floor that can make you trip. What can I do in the kitchen?  Clean up any spills right away.  Avoid walking on wet floors.  Keep items that you use a lot in easy-to-reach places.  If you need to reach something above you, use a strong step stool that has a grab bar.  Keep electrical cords out of the way.  Do not use floor polish or wax that makes floors slippery. If you must use wax, use non-skid floor wax.  Do not have throw rugs and other things on the floor that can make you trip. What can I do with my stairs?  Do not leave any items on the stairs.  Make sure that there are handrails on both sides of the stairs and use them. Fix handrails that are broken or loose. Make sure that handrails are as long as the stairways.  Check any carpeting to make sure that it is firmly attached to the stairs. Fix any carpet that is loose or worn.  Avoid having throw rugs at the top or bottom of the stairs. If you do have throw rugs, attach them to the floor with carpet tape.  Make sure that you have a light switch at the top of the stairs and the bottom of the stairs. If you do not have them, ask someone to add them for  you. What else can I do to help prevent falls?  Wear shoes that: ? Do not have high heels. ? Have rubber bottoms. ? Are comfortable and fit you well. ? Are closed at the toe. Do not wear sandals.  If you use a stepladder: ? Make sure that it is fully opened. Do not climb a closed stepladder. ? Make sure that both sides of the stepladder are locked into place. ? Ask someone to hold it for you, if possible.  Clearly mark and make sure that you can see: ? Any grab bars or handrails. ? First and last steps. ? Where the edge of each step is.  Use tools that help you move around (mobility aids) if they are needed. These include: ? Canes. ? Walkers. ? Scooters. ? Crutches.  Turn on the lights when you go into a dark area. Replace any light bulbs as soon as they burn out.  Set up your furniture so you have a clear path. Avoid moving your furniture around.  If any of your floors are uneven, fix them.  If there are any pets around you, be aware of where they are.  Review your medicines with your doctor. Some medicines can make you feel dizzy. This can increase your chance of falling. Ask your doctor what other things that you can do to help prevent falls. This information is not intended to replace advice given to you by your health care provider. Make sure you discuss any questions you have with your health care provider. Document Released: 10/08/2009 Document Revised: 05/19/2016 Document Reviewed: 01/16/2015 Elsevier Interactive  Patient Education  [employer].   Hearing Loss Hearing loss is a partial or total loss of the ability to hear. This can be temporary or permanent, and it can happen in one or both ears. Hearing loss may be referred to as deafness. Medical care is necessary to treat hearing loss properly and to prevent the condition from getting worse. Your hearing may partially or completely come back, depending on what caused your hearing loss and how severe it is.  In some cases, hearing loss is permanent. What are the causes? Common causes of hearing loss include:  Too much wax in the ear canal.  Infection of the ear canal or middle ear.  Fluid in the middle ear.  Injury to the ear or surrounding area.  An object stuck in the ear.  Prolonged exposure to loud sounds, such as music.  Less common causes of hearing loss include:  Tumors in the ear.  Viral or bacterial infections, such as meningitis.  A hole in the eardrum (perforated eardrum).  Problems with the hearing nerve that sends signals between the brain and the ear.  Certain medicines.  What are the signs or symptoms? Symptoms of this condition may include:  Difficulty telling the difference between sounds.  Difficulty following a conversation when there is background noise.  Lack of response to sounds in your environment. This may be most noticeable when you do not respond to startling sounds.  Needing to turn up the volume on the television, radio, etc.  Ringing in the ears.  Dizziness.  Pain in the ears.  How is this diagnosed? This condition is diagnosed based on a physical exam and a hearing test (audiometry). The audiometry test will be performed by a hearing specialist (audiologist). You may also be referred to an ear, nose, and throat (ENT) specialist (otolaryngologist). How is this treated? Treatment for recent onset of hearing loss may include:  Ear wax removal.  Being prescribed medicines to prevent infection (antibiotics).  Being prescribed medicines to reduce inflammation (corticosteroids).  Follow these instructions at home:  If you were prescribed an antibiotic medicine, take it as told by your health care provider. Do not stop taking the antibiotic even if you start to feel better.  Take over-the-counter and prescription medicines only as told by your health care provider.  Avoid loud noises.  Return to your normal activities as told by your  health care provider. Ask your health care provider what activities are safe for you.  Keep all follow-up visits as told by your health care provider. This is important. Contact a health care provider if:  You feel dizzy.  You develop new symptoms.  You vomit or feel nauseous.  You have a fever. Get help right away if:  You develop sudden changes in your vision.  You have severe ear pain.  You have new or increased weakness.  You have a severe headache. This information is not intended to replace advice given to you by your health care provider. Make sure you discuss any questions you have with your health care provider. Document Released: 12/12/2005 Document Revised: 05/19/2016 Document Reviewed: 04/29/2015 Elsevier Interactive Patient Education  2018 [employer].

## 2018-11-08 NOTE — Progress Notes (Signed)
Subjective:   [name] is a 67 y.o. male who presents for Medicare Annual/Subsequent preventive examination.  Review of Systems:  Physical assessment deferred to PCP.  Cardiac Risk Factors include: advanced age (>17men, >63 women);diabetes mellitus;smoking/ tobacco exposure;male gender;hypertension     Objective:    Vitals: BP 132/64   Pulse 63   Temp 98.4 F (36.9 C) (Oral)   Ht 5\' 7"  (1.702 m)   Wt 194 lb (88 kg)   SpO2 99%   BMI 30.38 kg/m   Body mass index is 30.38 kg/m.  Advanced Directives 11/08/2018 05/30/2018 04/05/2018 03/29/2018 03/27/2018 03/26/2018 12/06/2017  Does Patient Have a Medical Advance Directive? No No No No No No No  Would patient like information on creating a medical advance directive? No - Patient declined No - Patient declined No - Patient declined - No - Patient declined No - Patient declined No - Patient declined  Pre-existing out of facility DNR order (yellow form or pink MOST form) - - - - - - -    Tobacco Social History   Tobacco Use  Smoking Status Current Some Day Smoker  . Years: 24.00  . Types: Cigars  Smokeless Tobacco Never Used  Tobacco Comment   smokes 1 cigar a day     Ready to quit: No Counseling given: Yes Comment: smokes 1 cigar a day   Clinical Intake:  Pre-visit preparation completed: Yes  Pain : 0-10 Pain Score: 7  Pain Type: Chronic pain Pain Location: Leg Pain Orientation: Right, Left Pain Onset: More than a month ago Pain Frequency: Constant Effect of Pain on Daily Activities: not stopping him, he works through it     Nutritional Status: BMI 25 -29 Overweight Nutritional Risks: None Diabetes: Yes CBG done?: No Did pt. bring in CBG monitor from home?: No  How often do you need to have someone help you when you read instructions, pamphlets, or other written materials from your doctor or pharmacy?: 1 - Never What is the last grade level you completed in school?: some college  Interpreter Needed?:  No     Past Medical History:  Diagnosis Date  . Anemia   . Arthritis    OA  . Cervical radiculopathy    Dr. Vertell Limber neurosurgery  . Chronic lower back pain   . Diabetes mellitus    takes Metformin daily  . GERD (gastroesophageal reflux disease)    takes Protonix daily  . History of blood transfusion    "related to low HgB" ((09/10/2015  . Hyperlipidemia    takes Vytorin daily  . Hypertension    takes Benazepril and Bystolic daily  . PAD (peripheral artery disease) ([place])   . Pneumonia   . Shortness of breath dyspnea    with exertion  . Tobacco user   . Toe fracture, right 05/09/2011   Past Surgical History:  Procedure Laterality Date  . ABDOMINAL AORTOGRAM W/LOWER EXTREMITY N/A 12/11/2017   Procedure: ABDOMINAL AORTOGRAM W/LOWER EXTREMITY;  Surgeon: Angelia Mould, MD;  Location: [place] [facility];  Service: Cardiovascular;  Laterality: N/A;  . ANTERIOR CERVICAL DECOMP/DISCECTOMY FUSION  03/08/12   C6-7  . ANTERIOR CERVICAL DECOMP/DISCECTOMY FUSION  03/08/2012   Procedure: ANTERIOR CERVICAL DECOMPRESSION/DISCECTOMY FUSION 1 LEVEL/HARDWARE REMOVAL;  Surgeon: Erline Levine, MD;  Location: [place] [facility];  Service: Neurosurgery;  Laterality: N/A;  revison of C5-7 anterior cervical decompression with fusion with Cervical Five-Thoracic One anterior cervical decompression with fusion with interbody prothesis plating and bonegraft  .  BACK SURGERY  1996  . BYPASS GRAFT FEMORAL-PERONEAL Left 11/11/2016   Procedure: REDO LEFT FEMORAL-PERONEAL BYPASS WITH PROPATEN 6MM X 80CM GRAFT;  Surgeon: Angelia Mould, MD;  Location: [facility] OR;  Service: Vascular;  Laterality: Left;  . COLONOSCOPY W/ BIOPSIES AND POLYPECTOMY  08/17/2012   f/u 5 years, 4 polyps, no high grade dysplasia or malignancy, tubular adenoma, hyperplastic polyops  . ENTEROSCOPY N/A 12/11/2015   Procedure: ENTEROSCOPY;  Surgeon: Carol Ada, MD;  Location[facility];  Service: Endoscopy;  Laterality: N/A;  .  ENTEROSCOPY N/A 03/29/2018   Procedure: ENTEROSCOPY;  Surgeon: Carol Ada, MD;  Location: [facility] ENDOSCOPY;  Service: Endoscopy;  Laterality: N/A;  . ESOPHAGOGASTRODUODENOSCOPY  08/17/2012   normal esophagus and GEJ, diffuse gastritis with erythema- no malignancy, reactive gastropathy  with focal intestinal metaplasia  . FEMORAL-POPLITEAL BYPASS GRAFT Left 01/06/2016   Procedure: Left  COMMON FEMORAL-BELOW KNEE POPLITEAL ARTERY Bypass using non-reversed translocated saphenous vein graft from left leg;  Surgeon: Mal Misty, MD;  Location: [place];  Service: Vascular;  Laterality: Left;  . GIVENS CAPSULE STUDY N/A 11/24/2015   Procedure: GIVENS CAPSULE STUDY;  Surgeon: Juanita Craver, MD;  Location: [place];  Service: Endoscopy;  Laterality: N/A;  . HOT HEMOSTASIS N/A 03/29/2018   Procedure: HOT HEMOSTASIS (ARGON PLASMA COAGULATION/BICAP);  Surgeon: Carol Ada, MD;  Location: [place];  Service: Endoscopy;  Laterality: N/A;  . INGUINAL HERNIA REPAIR  1990's   right  . INTRAOPERATIVE ARTERIOGRAM Left 01/06/2016   Procedure: INTRA OPERATIVE ARTERIOGRAM LEFT LOWER LEG;  Surgeon: Mal Misty, MD;  Location: [place];  Service: Vascular;  Laterality: Left;  . INTRAOPERATIVE ARTERIOGRAM Left 11/11/2016   Procedure: INTRA OPERATIVE ARTERIOGRAM LEFT LOWER EXTRIMITY;  Surgeon: Angelia Mould, MD;  Location: [place];  Service: Vascular;  Laterality: Left;  . IR ANGIOGRAM FOLLOW UP STUDY  12/11/2017  . IR GENERIC HISTORICAL  10/24/2016   IR ANGIOGRAM FOLLOW UP STUDY  . LOWER EXTREMITY ANGIOGRAM Bilateral 07/30/2015   Procedure: Lower Extremity Angiogram;  Surgeon: Conrad Claysville, MD;  Location: [place] [facility];  Service: Cardiovascular;  Laterality: Bilateral;  . [place]   "lower"  . PERIPHERAL VASCULAR CATHETERIZATION N/A 07/30/2015   Procedure: Abdominal Aortogram;  Surgeon: Conrad [place], MD;  Location: [place] [facility];  Service: Cardiovascular;  Laterality: N/A;  . PERIPHERAL  VASCULAR CATHETERIZATION N/A 10/24/2016   Procedure: Abdominal Aortogram;  Surgeon: Angelia Mould, MD;  Location: [place] [facility];  Service: Cardiovascular;  Laterality: N/A;  . PERIPHERAL VASCULAR CATHETERIZATION N/A 10/24/2016   Procedure: Lower Extremity Angiography;  Surgeon: Angelia Mould, MD;  Location: [place] [facility];  Service: Cardiovascular;  Laterality: N/A;  . PERIPHERAL VASCULAR INTERVENTION Right 12/11/2017   Procedure: PERIPHERAL VASCULAR INTERVENTION;  Surgeon: Angelia Mould, MD;  Location: [place] [facility];  Service: Cardiovascular;  Laterality: Right;  Marland Kitchen VASCULAR SURGERY  ~ 2007   Stent SFA   . VEIN HARVEST Left 01/06/2016   Procedure: LEFT GREATER [place];  Surgeon: Mal Misty, MD;  Location: [facility] OR;  Service: Vascular;  Laterality: Left;   Family History  Problem Relation Age of Onset  . Cancer Mother        colon Cancer  . Hyperlipidemia Mother   . Diabetes Mother   . Heart disease Father   . Hypertension Father   . Cancer Sister        Uterine  . Diabetes Sister   .  Diabetes Brother    Social History   Socioeconomic History  . Marital status: Significant Other    Spouse name: Not on file  . Number of children: 3  . Years of education: 30  . Highest education level: Some college, no degree  Occupational History  . Not on file  Social Needs  . Financial resource strain: Hard  . Food insecurity:    Worry: Sometimes true    Inability: Sometimes true  . Transportation needs:    Medical: No    Non-medical: No  Tobacco Use  . Smoking status: Current Some Day Smoker    Years: 24.00    Types: Cigars  . Smokeless tobacco: Never Used  . Tobacco comment: smokes 1 cigar a day  Substance and Sexual Activity  . Alcohol use: Yes    Alcohol/week: 2.0 standard drinks    Types: 2 Cans of beer per week  . Drug use: No  . Sexual activity: Yes  Lifestyle  . Physical activity:    Days per week: 0 days     Minutes per session: 0 min  . Stress: Not at all  Relationships  . Social connections:    Talks on phone: Three times a week    Gets together: Three times a week    Attends religious service: 1 to 4 times per year    Active member of club or organization: Yes    Attends meetings of clubs or organizations: 1 to 4 times per year    Relationship status: Living with partner  Other Topics Concern  . Not on file  Social History Narrative   Lives alone. Lives in an apartment. Ground floor apartment. Smoke alarms present, no grab bars.    Has a shih-tzu, named Missy.    Patient eats a variety of foods. Drinks tea, Kool-aid, coffee      Use public transportation or brother will take places.   Some food insecurity.       Outpatient Encounter Medications as of 11/08/2018  Medication Sig  . atorvastatin (LIPITOR) 40 MG tablet Take 1 tablet (40 mg total) by mouth daily.  . benazepril (LOTENSIN) 5 MG tablet TAKE 1 TABLET BY MOUTH ONCE DAILY  . BYSTOLIC 20 MG TABS TAKE 1 TABLET BY MOUTH EVERY DAY  . calcium carbonate (TUMS EX) 750 MG chewable tablet Chew 2 tablets by mouth as needed for heartburn.  . clopidogrel (PLAVIX) 75 MG tablet Take 1 tablet (75 mg total) by mouth daily.  Marland Kitchen docusate sodium (COLACE) 100 MG capsule Take 1 capsule (100 mg total) by mouth 2 (two) times daily.  Marland Kitchen ezetimibe (ZETIA) 10 MG tablet TAKE 1 TABLET BY MOUTH ONCE DAILY  . FLUoxetine (PROZAC) 10 MG capsule TAKE 1 CAPSULE BY MOUTH ONCE DAILY  . gabapentin (NEURONTIN) 800 MG tablet TAKE 1.5 TABLETS (1,200 MG TOTAL) BY MOUTH 3 (THREE) TIMES DAILY.  . metFORMIN (GLUCOPHAGE) 1000 MG tablet TAKE ONE (1) TABLET BY MOUTH TWICE DAILY WITH A MEAL  . naproxen sodium (ALEVE) 220 MG tablet Take 220 mg by mouth.  . pantoprazole (PROTONIX) 40 MG tablet TAKE 1 TABLET (40 MG TOTAL) BY MOUTH DAILY.  Marland Kitchen acetaminophen (TYLENOL) 500 MG tablet Take 1,000 mg by mouth 3 (three) times daily as needed for moderate pain.  Marland Kitchen atorvastatin (LIPITOR)  40 MG tablet TAKE 1 TABLET BY MOUTH DAILY (Patient not taking: Reported on 11/08/2018)  . BYSTOLIC 20 MG TABS TAKE 1 TABLET BY MOUTH DAILY. (Patient not taking: Reported on 11/08/2018)  .  cephALEXin (KEFLEX) 500 MG capsule Take 1 capsule (500 mg total) by mouth 4 (four) times daily. (Patient not taking: Reported on 11/08/2018)  . dextromethorphan-guaiFENesin (MUCINEX DM) 30-600 MG 12hr tablet Take 1 tablet by mouth 2 (two) times daily as needed for cough. (Patient not taking: Reported on 07/11/2018)  . ferrous sulfate 325 (65 FE) MG tablet Take 1 tablet (325 mg total) by mouth daily. (Patient not taking: Reported on 07/11/2018)  . HYDROcodone-acetaminophen (NORCO/VICODIN) 5-325 MG tablet Take 1-2 tablets by mouth every 6 (six) hours as needed. (Patient not taking: Reported on 07/11/2018)  . HYDROcodone-acetaminophen (NORCO/VICODIN) 5-325 MG tablet Take 1-2 tablets by mouth every 6 (six) hours as needed. (Patient not taking: Reported on 11/08/2018)  . ondansetron (ZOFRAN ODT) 4 MG disintegrating tablet 4mg  ODT q4 hours prn nausea/vomit (Patient not taking: Reported on 07/11/2018)  . polyethylene glycol (MIRALAX / GLYCOLAX) packet Take 17 g by mouth daily. (Patient not taking: Reported on 07/11/2018)  . senna (SENOKOT) 8.6 MG TABS tablet Take 1 tablet (8.6 mg total) by mouth at bedtime. (Patient not taking: Reported on 07/11/2018)  . Vitamin D, Ergocalciferol, (DRISDOL) 50000 units CAPS capsule Take 1 capsule once weekly for the next 8 weeks for low vitamin D. After that, take 2,000 units daily of vitamin D (over the counter). (Patient not taking: Reported on 11/08/2018)   No facility-administered encounter medications on file as of 11/08/2018.     Activities of Daily Living In your present state of health, do you have any difficulty performing the following activities: 11/08/2018 03/27/2018  Hearing? Y N  Vision? N N  Difficulty concentrating or making decisions? N N  Walking or climbing stairs? N Y   Dressing or bathing? N Y  Doing errands, shopping? N N  Preparing Food and eating ? N -  Using the Toilet? N -  In the past six months, have you accidently leaked urine? N -  Do you have problems with loss of bowel control? N -  Managing your Medications? N -  Managing your Finances? N -  Housekeeping or managing your Housekeeping? N -  Some recent data might be hidden    Patient Care Team: Richarda Osmond, DO as PCP - General Carol Ada, MD as Consulting Physician (Gastroenterology) Juanita Craver, MD as Consulting Physician (Gastroenterology) Monna Fam, MD as Consulting Physician (Ophthalmology) Angelia Mould, MD as Consulting Physician (Vascular Surgery)   Assessment:   This is a routine wellness examination for [name].  Exercise Activities and Dietary recommendations Current Exercise Habits: The patient does not participate in regular exercise at present, Exercise limited by: orthopedic condition(s)  Goals    . HEMOGLOBIN A1C < 7.0    . Increase water intake    . Reduce sugar intake to X grams per day     Dilute sweet tea with half unsweet       Fall Risk Fall Risk  11/08/2018 05/30/2018 05/28/2018 04/05/2018 03/26/2018  Falls in the past year? 0 No No No No  Number falls in past yr: - - - - -  Comment - - - - -  Injury with Fall? - - - - -  Comment - - - - -  Risk Factor Category  - - - - -  Comment - - - - -  Risk for fall due to : - - - - -  Follow up - - - - -   Is the patient's home free of loose throw rugs in walkways, pet  beds, electrical cords, etc?   yes      Grab bars in the bathroom? no      Handrails on the stairs?   yes      Adequate lighting?   yes   Depression Screen PHQ 2/9 Scores 11/08/2018 05/30/2018 05/28/2018 04/05/2018  PHQ - 2 Score 0 0 0 0  PHQ- 9 Score - - - -    Cognitive Function MMSE - Mini Mental State Exam 11/08/2018  Orientation to time 5  Orientation to Place 5  Registration 3  Attention/ Calculation 5  Recall  3  Language- name 2 objects 2  Language- repeat 1  Language- follow 3 step command 3  Language- read & follow direction 1  Write a sentence 1  Copy design 1  Total score 30     6CIT Screen 11/08/2018  What Year? 0 points  What month? 0 points  What time? 0 points  Count back from 20 0 points  Months in reverse 0 points  Repeat phrase 0 points  Total Score 0    Immunization History  Administered Date(s) Administered  . Influenza Split 11/30/2011, 11/07/2012  . Influenza Whole 10/31/2007, 10/23/2008, 11/11/2009, 10/01/2010  . Influenza,inj,Quad PF,6+ Mos 08/27/2014, 09/11/2015, 09/13/2016, 10/09/2017, 11/08/2018  . Influenza-Unspecified 09/25/2013  . Pneumococcal Conjugate-13 09/13/2016  . Pneumococcal Polysaccharide-23 10/01/2010, 10/09/2017  . Td 12/26/2001  . Tdap 01/18/2012    Screening Tests Health Maintenance  Topic Date Due  . INFLUENZA VACCINE  07/26/2018  . HEMOGLOBIN A1C  09/25/2018  . FOOT EXAM  12/06/2018  . OPHTHALMOLOGY EXAM  09/26/2019  . COLONOSCOPY  12/27/2019  . TETANUS/TDAP  01/17/2022  . Hepatitis C Screening  Completed  . PNA vac Low Risk Adult  Completed   Cancer Screenings: Lung: Low Dose CT Chest recommended if Age 32-80 years, 30 pack-year currently smoking OR have quit w/in 15years. Patient does qualify. Colorectal: up to date   Additional Screenings:  Hepatitis C Screening: complete      Plan:  Flu vaccine given today. Patient overdue for DM f/u and to meet new physician. Appointment made for 12/04/18 with PCP. RN will call Dr. Payton Emerald office to get last eye exam that was done last month. Patient endorses some food insecurity and is now receiving some food stamps to supplement. Would like call from LCSW to discuss community resources. LCSW was seeing patients in [facility] while patient was here.   I have personally reviewed and noted the following in the patient's chart:   . Medical and social history . Use of alcohol, tobacco or  illicit drugs  . Current medications and supplements . Functional ability and status . Nutritional status . Physical activity . Advanced directives . List of other physicians . Hospitalizations, surgeries, and ER visits in previous 12 months . Vitals . Screenings to include cognitive, depression, and falls . Referrals and appointments  In addition, I have reviewed and discussed with patient certain preventive protocols, quality metrics, and best practice recommendations. A written personalized care plan for preventive services as well as general preventive health recommendations were provided to patient.     Esau Grew, RN  11/08/2018

## 2018-11-08 NOTE — Telephone Encounter (Signed)
Patient was in for AWV and would like referral to audiology. He is having trouble hearing. Did not repeat hearing test but did partially fail the one done last year.  Danley Danker, RN [facility] RN)

## 2018-11-09 ENCOUNTER — Telehealth: Payer: Self-pay | Admitting: Licensed Clinical Social Worker

## 2018-11-09 NOTE — Progress Notes (Signed)
Type of Service: Clinical Social Work  [facility] intern phone call to patient after receiving referral from Dr. Dayna Ramus for food resources. Patient said its been a long time since he utilized food resources. Patient agreed to participate in the food box program and stated he would come in on Monday to pick up his food box and to sign his application.   Plan: 1. Patient will pick up food box on Monday from front office 2. [facility] intern will follow up with patient before next food box meeting to remind patient to attend.   Audry Riles, [place] intern Behavioral Health Clinician,  [place] [phone]

## 2018-11-12 NOTE — Telephone Encounter (Signed)
Thanks for letting me know. I put in a referral.

## 2018-11-19 ENCOUNTER — Other Ambulatory Visit: Payer: Self-pay | Admitting: Student in an Organized Health Care Education/Training Program

## 2018-11-27 NOTE — Progress Notes (Signed)
error 

## 2018-12-04 ENCOUNTER — Encounter: Payer: Self-pay | Admitting: Student in an Organized Health Care Education/Training Program

## 2018-12-04 ENCOUNTER — Ambulatory Visit (INDEPENDENT_AMBULATORY_CARE_PROVIDER_SITE_OTHER): Payer: Medicare Other | Admitting: Student in an Organized Health Care Education/Training Program

## 2018-12-04 VITALS — BP 154/80 | HR 63 | Temp 98.3°F | Wt 195.8 lb

## 2018-12-04 DIAGNOSIS — I70221 Atherosclerosis of native arteries of extremities with rest pain, right leg: Secondary | ICD-10-CM

## 2018-12-04 DIAGNOSIS — Z716 Tobacco abuse counseling: Secondary | ICD-10-CM

## 2018-12-04 DIAGNOSIS — I10 Essential (primary) hypertension: Secondary | ICD-10-CM | POA: Diagnosis not present

## 2018-12-04 DIAGNOSIS — E7800 Pure hypercholesterolemia, unspecified: Secondary | ICD-10-CM | POA: Diagnosis not present

## 2018-12-04 DIAGNOSIS — M150 Primary generalized (osteo)arthritis: Secondary | ICD-10-CM

## 2018-12-04 DIAGNOSIS — E119 Type 2 diabetes mellitus without complications: Secondary | ICD-10-CM | POA: Diagnosis not present

## 2018-12-04 DIAGNOSIS — Z720 Tobacco use: Secondary | ICD-10-CM

## 2018-12-04 DIAGNOSIS — I739 Peripheral vascular disease, unspecified: Secondary | ICD-10-CM | POA: Diagnosis not present

## 2018-12-04 DIAGNOSIS — K219 Gastro-esophageal reflux disease without esophagitis: Secondary | ICD-10-CM

## 2018-12-04 DIAGNOSIS — M8949 Other hypertrophic osteoarthropathy, multiple sites: Secondary | ICD-10-CM

## 2018-12-04 DIAGNOSIS — M159 Polyosteoarthritis, unspecified: Secondary | ICD-10-CM

## 2018-12-04 LAB — POCT GLYCOSYLATED HEMOGLOBIN (HGB A1C): HbA1c, POC (controlled diabetic range): 6.7 % (ref 0.0–7.0)

## 2018-12-04 MED ORDER — BENAZEPRIL HCL 10 MG PO TABS: 10.0000 mg | ORAL_TABLET | Freq: Every day | ORAL | 0 refills | Status: DC

## 2018-12-04 MED ORDER — CETIRIZINE HCL 10 MG PO TABS: 10.0000 mg | ORAL_TABLET | Freq: Every day | ORAL | 1 refills | Status: DC

## 2018-12-04 MED ORDER — GABAPENTIN 100 MG PO CAPS: 100.0000 mg | ORAL_CAPSULE | Freq: Every day | ORAL | 0 refills | Status: DC

## 2018-12-04 NOTE — Assessment & Plan Note (Signed)
Patient describes continued symptoms and is followed by vascular surgery. He is taking clopidogrel 75mg  daily

## 2018-12-04 NOTE — Assessment & Plan Note (Signed)
Patient complains of reticular symptoms in upper and lower extremities- worse at night and with certain movements. Positive straight leg test. H/o cervical fusion C7-C9 Discussed with patient the cause of the symptoms and options for conservative vs. Invasive therapies.  Patient agreeable to trial of gabapentin at bed time at this time Will follow up at next appointment and consider referral to neurosurgery

## 2018-12-04 NOTE — Assessment & Plan Note (Signed)
Continue to counsel patient on smoking cessation

## 2018-12-04 NOTE — Assessment & Plan Note (Signed)
No current lipid panel in chart for several years. Patient is taking atorvastatin 40mg  daily and ezetimibe 10mg  daily. Patient is fasting at this appointment Checking lipids at this appointment. Will consider discontinuing medications as appropriate  Follow up with patient in ~6 weeks

## 2018-12-04 NOTE — Patient Instructions (Signed)
It was a pleasure to see you today!  To summarize our discussion for this visit:  Blood pressure is elevated. We will increase your benazepril dose and recheck your blood pressure in 2 weeks. Please check your blood pressure daily at home if you are able to and bring in that information at your next appointment  A1c is 6.7 which is great. Continue to take your metformin every day  You have been on pantoprazole for a long time for acid reflux. I would like to stop that medication and change to a histamine blocker (zyrtec)  Prescribing you a gabapentin medication to help with your nerve pain at night. Take it before bedtime  Some additional health maintenance measures we should update are: . Colonoscopy . Diabetic foot exam   Please return to our clinic to see me in about 6 weeks to follow up on your blood pressure and leg/arm pain.   Call the clinic at [phone] if your symptoms worsen or you have any concerns.  Thank you for allowing me to take part in your care,  Dr. Doristine Mango   Thanks for choosing [facility] for your primary care.

## 2018-12-04 NOTE — Assessment & Plan Note (Signed)
BP elevated to 154/80 today with evidence of mild retinopathy on recent eye exam. Patient does not monitor BP at home. Has been elevated for several past visits. Patient states he takes bystolic 20mg , benazepril 5mg  daily. Increased benazepril to 10mg  daily. Requested pt monitor BP at home daily and record for discussion at next visit. Requested patient return in 2 weeks for BP check.

## 2018-12-04 NOTE — Progress Notes (Signed)
Subjective:    Patient ID: [name], male    DOB: [birth year], 67 y.o.   MRN: [id number]   CC: meet new PCP, leg and arm pain at night  HPI:  Meet new PCP- topics I want to discuss with patient today are his blood pressure and mild retinopathy seen at last eye exam, his cholesterol, and medications.  Patient main concern today-  He states that for the past 3 months he has had increased radiating pain down both arms and legs which is worse at night or when he bends in a certain position. He describes the pain as sometimes cramping. He had episode of the cramping during visit when elevating left arm. Patient has history of radiculopathy with arthritic changes in spine and vertebral fusions of C7-C9.  Smoking status reviewed   ROS: pertinent noted in the HPI   Past Medical History:  Diagnosis Date  . Anemia   . Arthritis    OA  . Cervical radiculopathy    Dr. Vertell Limber neurosurgery  . Chronic lower back pain   . Diabetes mellitus    takes Metformin daily  . GERD (gastroesophageal reflux disease)    takes Protonix daily  . History of blood transfusion    "related to low HgB" ((09/10/2015  . Hyperlipidemia    takes Vytorin daily  . Hypertension    takes Benazepril and Bystolic daily  . PAD (peripheral artery disease) ([place])   . Pneumonia   . Shortness of breath dyspnea    with exertion  . Tobacco user   . Toe fracture, right 05/09/2011    Past Surgical History:  Procedure Laterality Date  . ABDOMINAL AORTOGRAM W/LOWER EXTREMITY N/A 12/11/2017   Procedure: ABDOMINAL AORTOGRAM W/LOWER EXTREMITY;  Surgeon: Angelia Mould, MD;  Location: [place] [facility];  Service: Cardiovascular;  Laterality: N/A;  . ANTERIOR CERVICAL DECOMP/DISCECTOMY FUSION  03/08/12   C6-7  . ANTERIOR CERVICAL DECOMP/DISCECTOMY FUSION  03/08/2012   Procedure: ANTERIOR CERVICAL DECOMPRESSION/DISCECTOMY FUSION 1 LEVEL/HARDWARE REMOVAL;  Surgeon: Erline Levine, MD;  Location: [place] [facility];  Service:  Neurosurgery;  Laterality: N/A;  revison of C5-7 anterior cervical decompression with fusion with Cervical Five-Thoracic One anterior cervical decompression with fusion with interbody prothesis plating and bonegraft  . BACK SURGERY  1996  . BYPASS GRAFT FEMORAL-PERONEAL Left 11/11/2016   Procedure: REDO LEFT FEMORAL-PERONEAL BYPASS WITH PROPATEN 6MM X 80CM GRAFT;  Surgeon: Angelia Mould, MD;  Location: [facility] OR;  Service: Vascular;  Laterality: Left;  . COLONOSCOPY W/ BIOPSIES AND POLYPECTOMY  08/17/2012   f/u 5 years, 4 polyps, no high grade dysplasia or malignancy, tubular adenoma, hyperplastic polyops  . ENTEROSCOPY N/A 12/11/2015   Procedure: ENTEROSCOPY;  Surgeon: Carol Ada, MD;  Location[facility];  Service: Endoscopy;  Laterality: N/A;  . ENTEROSCOPY N/A 03/29/2018   Procedure: ENTEROSCOPY;  Surgeon: Carol Ada, MD;  Location: [facility] ENDOSCOPY;  Service: Endoscopy;  Laterality: N/A;  . ESOPHAGOGASTRODUODENOSCOPY  08/17/2012   normal esophagus and GEJ, diffuse gastritis with erythema- no malignancy, reactive gastropathy  with focal intestinal metaplasia  . FEMORAL-POPLITEAL BYPASS GRAFT Left 01/06/2016   Procedure: Left  COMMON FEMORAL-BELOW KNEE POPLITEAL ARTERY Bypass using non-reversed translocated saphenous vein graft from left leg;  Surgeon: Mal Misty, MD;  Location: [place];  Service: Vascular;  Laterality: Left;  . GIVENS CAPSULE STUDY N/A 11/24/2015   Procedure: GIVENS CAPSULE STUDY;  Surgeon: Juanita Craver, MD;  Location: [place];  Service: Endoscopy;  Laterality: N/A;  .  HOT HEMOSTASIS N/A 03/29/2018   Procedure: HOT HEMOSTASIS (ARGON PLASMA COAGULATION/BICAP);  Surgeon: Carol Ada, MD;  Location: [place];  Service: Endoscopy;  Laterality: N/A;  . INGUINAL HERNIA REPAIR  1990's   right  . INTRAOPERATIVE ARTERIOGRAM Left 01/06/2016   Procedure: INTRA OPERATIVE ARTERIOGRAM LEFT LOWER LEG;  Surgeon: Mal Misty, MD;  Location: [place];  Service: Vascular;   Laterality: Left;  . INTRAOPERATIVE ARTERIOGRAM Left 11/11/2016   Procedure: INTRA OPERATIVE ARTERIOGRAM LEFT LOWER EXTRIMITY;  Surgeon: Angelia Mould, MD;  Location: [place];  Service: Vascular;  Laterality: Left;  . IR ANGIOGRAM FOLLOW UP STUDY  12/11/2017  . IR GENERIC HISTORICAL  10/24/2016   IR ANGIOGRAM FOLLOW UP STUDY  . LOWER EXTREMITY ANGIOGRAM Bilateral 07/30/2015   Procedure: Lower Extremity Angiogram;  Surgeon: Conrad [place], MD;  Location: [place] [facility];  Service: Cardiovascular;  Laterality: Bilateral;  . [place]   "lower"  . PERIPHERAL VASCULAR CATHETERIZATION N/A 07/30/2015   Procedure: Abdominal Aortogram;  Surgeon: Conrad [place], MD;  Location: [place] [facility];  Service: Cardiovascular;  Laterality: N/A;  . PERIPHERAL VASCULAR CATHETERIZATION N/A 10/24/2016   Procedure: Abdominal Aortogram;  Surgeon: Angelia Mould, MD;  Location: [place] [facility];  Service: Cardiovascular;  Laterality: N/A;  . PERIPHERAL VASCULAR CATHETERIZATION N/A 10/24/2016   Procedure: Lower Extremity Angiography;  Surgeon: Angelia Mould, MD;  Location: [place] [facility];  Service: Cardiovascular;  Laterality: N/A;  . PERIPHERAL VASCULAR INTERVENTION Right 12/11/2017   Procedure: PERIPHERAL VASCULAR INTERVENTION;  Surgeon: Angelia Mould, MD;  Location: [place] [facility];  Service: Cardiovascular;  Laterality: Right;  Marland Kitchen VASCULAR SURGERY  ~ 2007   Stent SFA   . VEIN HARVEST Left 01/06/2016   Procedure: LEFT GREATER SAPPHENOUS VEIN HARVEST;  Surgeon: Mal Misty, MD;  Location: [place];  Service: Vascular;  Laterality: Left;    Past medical history, surgical, family, and social history reviewed and updated in the EMR as appropriate.  Objective:  BP (!) 154/80   Pulse 63   Temp 98.3 F (36.8 C) (Oral)   Wt 195 lb 12.8 oz (88.8 kg)   SpO2 98%   BMI 30.67 kg/m   Vitals and nursing note reviewed  General: NAD, pleasant, able to participate in  exam Cardiac: RRR, S1 S2 present. normal heart sounds, no murmurs. Respiratory: CTAB, normal effort, No wheezes, rales or rhonchi Extremities: no edema or cyanosis. Skin: warm and dry, no rashes noted. Acanthosis nigricans on neck/face area with larger growth on left cheek Neuro: alert, no obvious focal deficits. Positive straight leg test symmetrical bilaterally Psych: Normal affect and mood   Assessment & Plan:    HYPERTENSION, BENIGN SYSTEMIC BP elevated to 154/80 today with evidence of mild retinopathy on recent eye exam. Patient does not monitor BP at home. Has been elevated for several past visits. Patient states he takes bystolic 20mg , benazepril 5mg  daily. Increased benazepril to 10mg  daily. Requested pt monitor BP at home daily and record for discussion at next visit. Requested patient return in 2 weeks for BP check.  HYPERCHOLESTEROLEMIA No current lipid panel in chart for several years. Patient is taking atorvastatin 40mg  daily and ezetimibe 10mg  daily. Patient is fasting at this appointment Checking lipids at this appointment. Will consider discontinuing medications as appropriate  Follow up with patient in ~6 weeks  GERD Patient is taking pantoprazole 40mg  daily. He states he has well controlled symptoms and has been on  this regimen for 7-8 years. I discussed the risks of long term PPI therapy and patient is understanding and agreeable to a trial of discontinuing the PPI and starting a histamine blocker Prescribed zyrtec and asked patient to use prescription or OTC, whichever is more affordable for him  Claudication of both lower extremities Patient describes continued symptoms and is followed by vascular surgery. He is taking clopidogrel 75mg  daily  Osteoarthritis Patient complains of reticular symptoms in upper and lower extremities- worse at night and with certain movements. Positive straight leg test. H/o cervical fusion C7-C9 Discussed with patient the cause of  the symptoms and options for conservative vs. Invasive therapies.  Patient agreeable to trial of gabapentin at bed time at this time Will follow up at next appointment and consider referral to neurosurgery  Tobacco abuse Continue to counsel patient on smoking cessation    Doristine Mango, [facility] PGY-1

## 2018-12-04 NOTE — Assessment & Plan Note (Signed)
Patient is taking pantoprazole 40mg  daily. He states he has well controlled symptoms and has been on this regimen for 7-8 years. I discussed the risks of long term PPI therapy and patient is understanding and agreeable to a trial of discontinuing the PPI and starting a histamine blocker Prescribed zyrtec and asked patient to use prescription or OTC, whichever is more affordable for him

## 2018-12-05 LAB — LIPID PANEL
Chol/HDL Ratio: 2 ratio (ref 0.0–5.0)
Cholesterol, Total: 79 mg/dL — ABNORMAL LOW (ref 100–199)
HDL: 40 mg/dL (ref >39)
LDL Calculated: 27 mg/dL (ref 0–99)
Triglycerides: 58 mg/dL (ref 0–149)
VLDL Cholesterol Cal: 12 mg/dL (ref 5–40)

## 2019-01-09 ENCOUNTER — Ambulatory Visit (HOSPITAL_COMMUNITY)
Admission: RE | Admit: 2019-01-09 | Discharge: 2019-01-09 | Disposition: A | Discharge status: Home / Self Care | Payer: Medicare Other | Source: Ambulatory Visit | Attending: Vascular Surgery | Admitting: Vascular Surgery

## 2019-01-09 ENCOUNTER — Ambulatory Visit: Payer: Medicare Other | Admitting: Vascular Surgery

## 2019-01-09 ENCOUNTER — Ambulatory Visit (INDEPENDENT_AMBULATORY_CARE_PROVIDER_SITE_OTHER)
Admission: RE | Admit: 2019-01-09 | Discharge: 2019-01-09 | Disposition: A | Discharge status: Home / Self Care | Payer: Medicare Other | Source: Ambulatory Visit | Attending: Vascular Surgery | Admitting: Vascular Surgery

## 2019-01-09 DIAGNOSIS — I70221 Atherosclerosis of native arteries of extremities with rest pain, right leg: Secondary | ICD-10-CM

## 2019-01-09 DIAGNOSIS — I739 Peripheral vascular disease, unspecified: Secondary | ICD-10-CM

## 2019-01-16 ENCOUNTER — Other Ambulatory Visit: Payer: Self-pay

## 2019-01-16 ENCOUNTER — Encounter: Payer: Self-pay | Admitting: Vascular Surgery

## 2019-01-16 ENCOUNTER — Ambulatory Visit (INDEPENDENT_AMBULATORY_CARE_PROVIDER_SITE_OTHER): Payer: Medicare Other | Admitting: Vascular Surgery

## 2019-01-16 VITALS — BP 107/74 | HR 67 | Temp 97.4°F | Resp 16 | Ht 67.0 in | Wt 195.0 lb

## 2019-01-16 DIAGNOSIS — I70221 Atherosclerosis of native arteries of extremities with rest pain, right leg: Secondary | ICD-10-CM

## 2019-01-16 DIAGNOSIS — I739 Peripheral vascular disease, unspecified: Secondary | ICD-10-CM | POA: Diagnosis not present

## 2019-01-16 DIAGNOSIS — R0989 Other specified symptoms and signs involving the circulatory and respiratory systems: Secondary | ICD-10-CM

## 2019-01-16 NOTE — Progress Notes (Signed)
Patient name: [name] MRN: [id number] DOB: May 04, 1951 Sex: male  REASON FOR VISIT:   Follow-up of peripheral vascular disease.  HPI:   KEY CEN is a pleasant 68 y.o. male have been following with peripheral vascular disease.  Most recently he underwent angioplasty and stenting of the right external iliac artery with a VBX stent on 12/11/2017.  He comes in for a routine follow-up visit.  Since I saw him last he continues to have bilateral calf claudication which occurs at about 40 yards.  His symptoms are equal on both sides.  The symptoms are brought on by ambulation and relieved with rest.  He denies any history of rest pain or history of nonhealing ulcers.  He does not smoke cigarettes but he does smoke cigars.  He does also describe some shooting pain in his right foot and also paresthesias in his left foot.  The symptoms are chronic.  His diabetes and hypertension have been under good control.  He denies any history of stroke, TIAs, expressive or receptive aphasia, or amaurosis fugax.  He is on Plavix and is on a statin.  Past Medical History:  Diagnosis Date  . Anemia   . Arthritis    OA  . Cervical radiculopathy    Dr. Vertell Limber neurosurgery  . Chronic lower back pain   . Diabetes mellitus    takes Metformin daily  . GERD (gastroesophageal reflux disease)    takes Protonix daily  . History of blood transfusion    "related to low HgB" ((09/10/2015  . Hyperlipidemia    takes Vytorin daily  . Hypertension    takes Benazepril and Bystolic daily  . PAD (peripheral artery disease) ([place])   . Pneumonia   . Shortness of breath dyspnea    with exertion  . Tobacco user   . Toe fracture, right 05/09/2011    Family History  Problem Relation Age of Onset  . Cancer Mother        colon Cancer  . Hyperlipidemia Mother   . Diabetes Mother   . Heart disease Father   . Hypertension Father   . Cancer Sister        Uterine  . Diabetes Sister   . Diabetes Brother      SOCIAL HISTORY: Social History   Tobacco Use  . Smoking status: Current Some Day Smoker    Years: 24.00    Types: Cigars  . Smokeless tobacco: Never Used  . Tobacco comment: smokes 1 cigar a day  Substance Use Topics  . Alcohol use: Yes    Alcohol/week: 2.0 standard drinks    Types: 2 Cans of beer per week    Allergies  Allergen Reactions  . Glipizide Other (See Comments)    REACTION IS SIDE EFFECT Severe hypoglycemia to 40s.     Current Outpatient Medications  Medication Sig Dispense Refill  . atorvastatin (LIPITOR) 40 MG tablet Take 1 tablet (40 mg total) by mouth daily. 90 tablet 1  . benazepril (LOTENSIN) 10 MG tablet Take 1 tablet (10 mg total) by mouth daily. 90 tablet 0  . benazepril (LOTENSIN) 5 MG tablet     . BYSTOLIC 20 MG TABS TAKE 1 TABLET BY MOUTH EVERY DAY 30 tablet 0  . cetirizine (ZYRTEC ALLERGY) 10 MG tablet Take 1 tablet (10 mg total) by mouth daily. 90 tablet 1  . clopidogrel (PLAVIX) 75 MG tablet Take 1 tablet (75 mg total) by mouth daily. 90 tablet 0  . [facility] SENNA  8.6 MG tablet TAKE 1 TABLET (8.6 MG TOTAL) BY MOUTH AT BEDTIME.    Marland Kitchen ezetimibe (ZETIA) 10 MG tablet TAKE 1 TABLET BY MOUTH ONCE DAILY 90 tablet 1  . FLUoxetine (PROZAC) 10 MG capsule TAKE 1 CAPSULE BY MOUTH ONCE DAILY 90 capsule 1  . gabapentin (NEURONTIN) 100 MG capsule Take 1 capsule (100 mg total) by mouth at bedtime. 90 capsule 0  . metFORMIN (GLUCOPHAGE) 1000 MG tablet TAKE ONE (1) TABLET BY MOUTH TWICE DAILY WITH A MEAL 180 tablet 1  . naproxen sodium (ALEVE) 220 MG tablet Take 220 mg by mouth.    . pantoprazole (PROTONIX) 40 MG tablet     . Vitamin D, Ergocalciferol, (DRISDOL) 50000 units CAPS capsule Take 1 capsule once weekly for the next 8 weeks for low vitamin D. After that, take 2,000 units daily of vitamin D (over the counter). 8 capsule 0  . acetaminophen (TYLENOL) 500 MG tablet Take 1,000 mg by mouth 3 (three) times daily as needed for moderate pain.    . calcium carbonate  (TUMS EX) 750 MG chewable tablet Chew 2 tablets by mouth as needed for heartburn.    . ferrous sulfate 325 (65 FE) MG tablet Take 1 tablet (325 mg total) by mouth daily. (Patient not taking: Reported on 07/11/2018) 30 tablet 11  . polyethylene glycol (MIRALAX / GLYCOLAX) packet Take 17 g by mouth daily. (Patient not taking: Reported on 07/11/2018) 14 each 0   No current facility-administered medications for this visit.     REVIEW OF SYSTEMS:  [X]  denotes positive finding, [ ]  denotes negative finding Cardiac  Comments:  Chest pain or chest pressure:    Shortness of breath upon exertion:    Short of breath when lying flat:    Irregular heart rhythm:        Vascular    Pain in calf, thigh, or hip brought on by ambulation:    Pain in feet at night that wakes you up from your sleep:     Blood clot in your veins:    Leg swelling:         Pulmonary    Oxygen at home:    Productive cough:     Wheezing:         Neurologic    Sudden weakness in arms or legs:     Sudden numbness in arms or legs:     Sudden onset of difficulty speaking or slurred speech:    Temporary loss of vision in one eye:     Problems with dizziness:         Gastrointestinal    Blood in stool:     Vomited blood:         Genitourinary    Burning when urinating:     Blood in urine:        Psychiatric    Major depression:         Hematologic    Bleeding problems:    Problems with blood clotting too easily:        Skin    Rashes or ulcers:        Constitutional    Fever or chills:     PHYSICAL EXAM:   Vitals:   01/16/19 1325  BP: 107/74  Pulse: 67  Resp: 16  Temp: (!) 97.4 F (36.3 C)  TempSrc: Oral  SpO2: 99%  Weight: 195 lb (88.5 kg)  Height: 5\' 7"  (1.702 m)    GENERAL: The patient is  a well-nourished male, in no acute distress. The vital signs are documented above. CARDIAC: There is a regular rate and rhythm.  VASCULAR: He has a soft right carotid bruit. On the right side he has a  diminished but palpable femoral pulse.  I cannot palpate pedal pulses. On the left side he has a palpable femoral pulse.  I cannot palpate pedal pulses. He has no significant lower extremity swelling. PULMONARY: There is good air exchange bilaterally without wheezing or rales. ABDOMEN: Soft and non-tender with normal pitched bowel sounds.  MUSCULOSKELETAL: There are no major deformities or cyanosis. NEUROLOGIC: No focal weakness or paresthesias are detected. SKIN: There are no ulcers or rashes noted. PSYCHIATRIC: The patient has a normal affect.  DATA:    DUPLEX RIGHT ILIAC ARTERY: I have reviewed the duplex scan that was done on 01/09/2019.  This shows a greater than 50% stenosis proximal to the stent on the right but there is biphasic flow throughout.  There are also elevated velocities to 317 cm/s in the proximal stent and mildly elevated velocities in the mid and distal stent.  However there is biphasic flow throughout.  ARTERIAL DOPPLER STUDY: I also reviewed the arterial Doppler study that was done on 01/09/2019.  On the right side there was a monophasic dorsalis pedis and posterior tibial signal with an ABI of 64%.  Toe pressure on the right was 15 mmHg.  On the left side there was a biphasic posterior tibial signal with a monophasic dorsalis pedis signal.  ABI was 70% with a toe pressure of 123 mmHg.  GRAFT DUPLEX: I have reviewed the graft duplex scan that was done on 01/09/2019.  This shows that his left femoral to peroneal artery bypass graft is patent with no areas of stenosis noted within the graft.  MEDICAL ISSUES:   PERIPHERAL VASCULAR DISEASE: The patient's claudication symptoms are stable.  He is right external iliac artery stent is patent with biphasic flow throughout and only some mild disease noted in the iliac system.  His left femoral to peroneal artery bypass graft is patent.  I have ordered follow-up studies in 9 months and I will see him back at that time.  We have  discussed the importance of tobacco cessation.  I encouraged him to stay as active as possible.  He is on Plavix and is on a statin.  RIGHT CAROTID BRUIT: He is asymptomatic.  I have recommended a carotid duplex scan.  He could not stay for that exam today so I have ordered the test and he will return in the near future.  I explained that there is about a 30% chance that we find a significant carotid stenosis given that he has a bruit.  He is on Plavix and is on a statin.  Deitra Mayo [facility] [phone]

## 2019-01-18 ENCOUNTER — Ambulatory Visit (INDEPENDENT_AMBULATORY_CARE_PROVIDER_SITE_OTHER): Payer: Medicare Other | Admitting: Student in an Organized Health Care Education/Training Program

## 2019-01-18 ENCOUNTER — Ambulatory Visit (HOSPITAL_COMMUNITY)
Admission: RE | Admit: 2019-01-18 | Discharge: 2019-01-18 | Disposition: A | Discharge status: Home / Self Care | Payer: Medicare Other | Source: Ambulatory Visit | Attending: Family | Admitting: Family

## 2019-01-18 ENCOUNTER — Encounter: Payer: Self-pay | Admitting: Student in an Organized Health Care Education/Training Program

## 2019-01-18 ENCOUNTER — Other Ambulatory Visit: Payer: Self-pay

## 2019-01-18 VITALS — BP 122/61 | HR 77 | Temp 98.1°F | Wt 187.0 lb

## 2019-01-18 DIAGNOSIS — K219 Gastro-esophageal reflux disease without esophagitis: Secondary | ICD-10-CM | POA: Diagnosis not present

## 2019-01-18 DIAGNOSIS — I70221 Atherosclerosis of native arteries of extremities with rest pain, right leg: Secondary | ICD-10-CM | POA: Diagnosis not present

## 2019-01-18 DIAGNOSIS — R911 Solitary pulmonary nodule: Secondary | ICD-10-CM

## 2019-01-18 DIAGNOSIS — E1151 Type 2 diabetes mellitus with diabetic peripheral angiopathy without gangrene: Secondary | ICD-10-CM | POA: Insufficient documentation

## 2019-01-18 DIAGNOSIS — R0989 Other specified symptoms and signs involving the circulatory and respiratory systems: Secondary | ICD-10-CM | POA: Diagnosis not present

## 2019-01-18 DIAGNOSIS — R918 Other nonspecific abnormal finding of lung field: Secondary | ICD-10-CM

## 2019-01-18 DIAGNOSIS — I10 Essential (primary) hypertension: Secondary | ICD-10-CM

## 2019-01-18 DIAGNOSIS — Z720 Tobacco use: Secondary | ICD-10-CM

## 2019-01-18 DIAGNOSIS — M5412 Radiculopathy, cervical region: Secondary | ICD-10-CM | POA: Diagnosis not present

## 2019-01-18 DIAGNOSIS — I739 Peripheral vascular disease, unspecified: Secondary | ICD-10-CM

## 2019-01-18 DIAGNOSIS — E118 Type 2 diabetes mellitus with unspecified complications: Secondary | ICD-10-CM

## 2019-01-18 DIAGNOSIS — I70209 Unspecified atherosclerosis of native arteries of extremities, unspecified extremity: Secondary | ICD-10-CM | POA: Insufficient documentation

## 2019-01-18 MED ORDER — RANITIDINE HCL 75 MG PO TABS: 75.0000 mg | ORAL_TABLET | Freq: Two times a day (BID) | ORAL | 0 refills | Status: DC | PRN

## 2019-01-18 MED ORDER — NICOTINE 7 MG/24HR TD PT24: 7.0000 mg | MEDICATED_PATCH | Freq: Every day | TRANSDERMAL | 0 refills | Status: DC

## 2019-01-18 MED ORDER — GABAPENTIN 100 MG PO CAPS: 200.0000 mg | ORAL_CAPSULE | Freq: Every day | ORAL | 3 refills | Status: DC

## 2019-01-18 NOTE — Addendum Note (Signed)
Addended by: Richarda Osmond on: 01/18/2019 03:23 PM   Modules accepted: Orders

## 2019-01-18 NOTE — Assessment & Plan Note (Signed)
Reduced to 3 cigars per week. States it is more of a habit and never tried to quit so doesn't know if he has withdrawals or cravings. Encouraged patient to shoot for goal of ~2 cigars per week Prescribed nicotine patch 7mg  Provided with 1-800-QUITNOW resource Follow up at next appointment

## 2019-01-18 NOTE — Assessment & Plan Note (Signed)
Asymptomatic. Discontinued PPI at last visit and started patient on zyrtec by mistake. Patient stated that he has been asymptomatic so we agreed to stop the medication and I will prescribe Zantac for PRN medication. Not to take on a daily basis unless needed.  Follow up on next appointment.

## 2019-01-18 NOTE — Progress Notes (Addendum)
Subjective:    Patient ID: [name], male    DOB: Nov 16, 1951, 68 y.o.   MRN: [id number]   CC: BP, diabetes, cholesterol, neuropathy  HPI: BP reviewed with medication change at last exam. Patient states that he had nausea for first couple days of new medication dose but has since then been asymptomatic and tolerating well. He denies any episodes of syncope/lightheadedness, no headache. He has been checking BP at home and well controlled- waiting for new cuff.  HLD- reviewed lipid panel with patient from last visit which is wnl. He is tolerating medication well.   GERD- patient is not experiencing reflux symptoms. He did not notice difference in medication check last apt. He has been taking daily. Agreeable to discontinue medication we started at last appointment and starting a PRN if needed.  PVD- patient following up with vascular surgery. He states that he still get's leg cramps when walking increased distance but it is unchanged from previous. Rates as 6/10. Bilateral. VS recommended continue follow up as disease is stable. Patient requested refill of handicap placard documentation.  Peripheral neuropathy- patient states that the gabapentin is improving his pain but is not resolved. He has been taking it in the mornings. Denies drowsiness. His symptoms are worse at night and when he bends over.  Tobacco abuse- patient has been decreasing his smoking amount. About 3 cigars per week. He has never tried smoking cessation supplements such as patches/gum.   Diabetic foot exam due. A1c from last appointment (11/2018) was 6.7. denies changes in urination, thirst, or hunger.  Patient states that he has had difficulty with falling asleep at night for many years. He has recently starting taking melatonin OTC which he states helps. He takes his gabapentin in am with no sedation. Neuropathy worse at night.  Smoking status reviewed   ROS: pertinent noted in the HPI   Past medical history,  surgical, family, and social history reviewed and updated in the EMR as appropriate.  Objective:  BP 122/61   Pulse 77   Temp 98.1 F (36.7 C) (Oral)   Wt 187 lb (84.8 kg)   SpO2 99%   BMI 29.29 kg/m   Vitals and nursing note reviewed  General: NAD, pleasant, able to participate in exam Cardiac: RRR, S1 S2 present. normal heart sounds, no murmurs. R carotid bruit present Respiratory: CTAB, normal effort, No wheezes, rales or rhonchi Extremities: no edema or cyanosis. Skin: warm and dry, no rashes noted Neuro: alert, no obvious focal deficits Psych: Normal affect and mood Diabetic Foot Exam - Simple   Simple Foot Form Diabetic Foot exam was performed with the following findings:  Yes 01/18/2019  2:40 PM  Visual Inspection No deformities, no ulcerations, no other skin breakdown bilaterally:  Yes Sensation Testing Intact to touch and monofilament testing bilaterally:  Yes Pulse Check Posterior Tibialis and Dorsalis pulse intact bilaterally:  Yes Comments     Assessment & Plan:    HYPERTENSION, BENIGN SYSTEMIC Well controlled. Continue bystolic 20mg , benazepril 122/61 today  PAD (peripheral artery disease) (HCC) Stable. Following with vascular surgery who recommended continue with statin therapy and follow up 9 month. Patient received carotid duplex today which showed 1-39% stenosis L and <50% R.  Significant carotid bruit present on R. No bruit appreciated on L Lipid panel from 11/2018 normal Continue statin  GERD Asymptomatic. Discontinued PPI at last visit and started patient on zyrtec by mistake. Patient stated that he has been asymptomatic so we agreed to stop the  medication and I will prescribe Zantac for PRN medication. Not to take on a daily basis unless needed.  Follow up on next appointment.  Type 2 diabetes with complication (HCC) Hgb U2V at last appointment resulted as 6.7 Peripheral neuropathy present and improved with gabapentin from last appointment but  not resolved Increase dose of gabapentin to 200mg  QHS and reiterated to take at bedtime Patient denies sedation with medication Symptoms worse at night  Tobacco abuse Reduced to 3 cigars per week. States it is more of a habit and never tried to quit so doesn't know if he has withdrawals or cravings. Encouraged patient to shoot for goal of ~2 cigars per week Prescribed nicotine patch 7mg  Provided with 1-800-QUITNOW resource Follow up at next appointment  At next visit- I would like to address increased activity.  Diabetic foot exam performed- normal sensation, pulses easily palpated, no ulcers or lesions. Repeat in 1 year or sooner if concern.  Doristine Mango, [facility] PGY-1

## 2019-01-18 NOTE — Assessment & Plan Note (Signed)
Stable. Following with vascular surgery who recommended continue with statin therapy and follow up 9 month. Patient received carotid duplex today which showed 1-39% stenosis L and <50% R.  Significant carotid bruit present on R. No bruit appreciated on L Lipid panel from 11/2018 normal Continue statin

## 2019-01-18 NOTE — Assessment & Plan Note (Signed)
Hgb A1c at last appointment resulted as 6.7 Peripheral neuropathy present and improved with gabapentin from last appointment but not resolved Increase dose of gabapentin to 200mg  QHS and reiterated to take at bedtime Patient denies sedation with medication Symptoms worse at night

## 2019-01-18 NOTE — Patient Instructions (Addendum)
It was a pleasure to see you today!  To summarize our discussion for this visit:  Cholesterol looks great from last visit. Let's keep with the same medication  We are going to discontinue your GERD medicine and I will prescribe another if you need it- Zantac  Your blood pressure is well controlled so we will keep you on your same medication  Your gabapentin is helping your nerve pain so we will continue that but we are going to change the time you take it to bedtime and increase the dose  We discussed decreasing your smoking to ~2 cigars per week. I will prescribe you nicotine patches to use to reduce cravings and you can also call 1-800-QUIT NOW to receive free support to stop smoking  We also checked your feet for any diabetic changes  Your A1c is 6.7  Colonoscopy in about 5 years  Please return to our clinic to see me in 3-4 months for check of your chronic diseases or sooner if you need anything. Call the clinic at [phone] if your symptoms worsen or you have any concerns.  Thank you for allowing me to take part in your care,  Dr. Doristine Mango   Thanks for choosing [facility] for your primary care.

## 2019-01-18 NOTE — Assessment & Plan Note (Signed)
Well controlled. Continue bystolic 20mg , benazepril 122/61 today

## 2019-01-23 ENCOUNTER — Telehealth: Payer: Self-pay | Admitting: *Deleted

## 2019-01-23 NOTE — Telephone Encounter (Signed)
-----   Message from Richarda Osmond, DO sent at 01/18/2019  3:23 PM EST ----- Hey team, please help patient schedule this CT at a location convenient for him. It is a follow up from his CT last year to check for any changes.

## 2019-01-23 NOTE — Telephone Encounter (Signed)
Pt informed of CT appt. 01/29/19, [facility] 1st floor radiology. Must arrive @ 3:45. Dallas Scorsone Kennon Holter, CMA

## 2019-01-28 ENCOUNTER — Ambulatory Visit (INDEPENDENT_AMBULATORY_CARE_PROVIDER_SITE_OTHER): Payer: Medicare Other | Admitting: Student in an Organized Health Care Education/Training Program

## 2019-01-28 ENCOUNTER — Other Ambulatory Visit: Payer: Self-pay

## 2019-01-28 ENCOUNTER — Encounter: Payer: Self-pay | Admitting: Student in an Organized Health Care Education/Training Program

## 2019-01-28 DIAGNOSIS — I70221 Atherosclerosis of native arteries of extremities with rest pain, right leg: Secondary | ICD-10-CM

## 2019-01-28 DIAGNOSIS — I10 Essential (primary) hypertension: Secondary | ICD-10-CM

## 2019-01-28 DIAGNOSIS — R911 Solitary pulmonary nodule: Secondary | ICD-10-CM | POA: Insufficient documentation

## 2019-01-28 DIAGNOSIS — Z720 Tobacco use: Secondary | ICD-10-CM | POA: Diagnosis not present

## 2019-01-28 DIAGNOSIS — E1142 Type 2 diabetes mellitus with diabetic polyneuropathy: Secondary | ICD-10-CM | POA: Diagnosis not present

## 2019-01-28 DIAGNOSIS — IMO0001 Reserved for inherently not codable concepts without codable children: Secondary | ICD-10-CM | POA: Insufficient documentation

## 2019-01-28 NOTE — Progress Notes (Signed)
Subjective:    Patient ID: [name], male    DOB: [birth year], 68 y.o.   MRN: [id number]   CC: follow up for HTN, chest CT, activity discussion, and smoking cessation discussion  HPI: Patient states that he has been adherent to his medication without side effects. He denies headache, chest pain, palpitation.   He is scheduled for a 12 month f/u chest CT tomorrow. We discussed the purpose of the CT and briefly the events that will occur after depending on the results.  We also discussed the increased risk of smoking on lung malignancies. He has not used the [employer] that we discussed at last appointment. He states "I have to have my cigars". He is still smoking ~3 or more per week.  Smoking status reviewed   ROS: pertinent noted in the HPI   Past Medical History:  Diagnosis Date  . Anemia   . Arthritis    OA  . Cervical radiculopathy    Dr. Vertell Limber neurosurgery  . Chronic lower back pain   . Diabetes mellitus    takes Metformin daily  . GERD (gastroesophageal reflux disease)    takes Protonix daily  . History of blood transfusion    "related to low HgB" ((09/10/2015  . Hyperlipidemia    takes Vytorin daily  . Hypertension    takes Benazepril and Bystolic daily  . PAD (peripheral artery disease) ([place])   . Pneumonia   . Shortness of breath dyspnea    with exertion  . Tobacco user   . Toe fracture, right 05/09/2011    Past Surgical History:  Procedure Laterality Date  . ABDOMINAL AORTOGRAM W/LOWER EXTREMITY N/A 12/11/2017   Procedure: ABDOMINAL AORTOGRAM W/LOWER EXTREMITY;  Surgeon: Angelia Mould, MD;  Location: [facility];  Service: Cardiovascular;  Laterality: N/A;  . ANTERIOR CERVICAL DECOMP/DISCECTOMY FUSION  03/08/12   C6-7  . ANTERIOR CERVICAL DECOMP/DISCECTOMY FUSION  03/08/2012   Procedure: ANTERIOR CERVICAL DECOMPRESSION/DISCECTOMY FUSION 1 LEVEL/HARDWARE REMOVAL;  Surgeon: Erline Levine, MD;  Location: [facility];  Service:  Neurosurgery;  Laterality: N/A;  revison of C5-7 anterior cervical decompression with fusion with Cervical Five-Thoracic One anterior cervical decompression with fusion with interbody prothesis plating and bonegraft  . BACK SURGERY  1996  . BYPASS GRAFT FEMORAL-PERONEAL Left 11/11/2016   Procedure: REDO LEFT FEMORAL-PERONEAL BYPASS WITH PROPATEN 6MM X 80CM GRAFT;  Surgeon: Angelia Mould, MD;  Location: [facility];  Service: Vascular;  Laterality: Left;  . COLONOSCOPY W/ BIOPSIES AND POLYPECTOMY  08/17/2012   f/u 5 years, 4 polyps, no high grade dysplasia or malignancy, tubular adenoma, hyperplastic polyops  . ENTEROSCOPY N/A 12/11/2015   Procedure: ENTEROSCOPY;  Surgeon: Carol Ada, MD;  Location: [facility];  Service: Endoscopy;  Laterality: N/A;  . ENTEROSCOPY N/A 03/29/2018   Procedure: ENTEROSCOPY;  Surgeon: Carol Ada, MD;  Location: [facility];  Service: Endoscopy;  Laterality: N/A;  . ESOPHAGOGASTRODUODENOSCOPY  08/17/2012   normal esophagus and GEJ, diffuse gastritis with erythema- no malignancy, reactive gastropathy  with focal intestinal metaplasia  . FEMORAL-POPLITEAL BYPASS GRAFT Left 01/06/2016   Procedure: Left  COMMON FEMORAL-BELOW KNEE POPLITEAL ARTERY Bypass using non-reversed translocated saphenous vein graft from left leg;  Surgeon: Mal Misty, MD;  Location: [place] [facility];  Service: Vascular;  Laterality: Left;  . GIVENS CAPSULE STUDY N/A 11/24/2015   Procedure: GIVENS CAPSULE STUDY;  Surgeon: Juanita Craver, MD;  Location: [facility];  Service: Endoscopy;  Laterality: N/A;  . HOT HEMOSTASIS N/A 03/29/2018  Procedure: HOT HEMOSTASIS (ARGON PLASMA COAGULATION/BICAP);  Surgeon: Carol Ada, MD;  Location: [facility];  Service: Endoscopy;  Laterality: N/A;  . INGUINAL HERNIA REPAIR  1990's   right  . INTRAOPERATIVE ARTERIOGRAM Left 01/06/2016   Procedure: INTRA OPERATIVE ARTERIOGRAM LEFT LOWER LEG;  Surgeon: Mal Misty, MD;  Location: [place];  Service: Vascular;   Laterality: Left;  . INTRAOPERATIVE ARTERIOGRAM Left 11/11/2016   Procedure: INTRA OPERATIVE ARTERIOGRAM LEFT LOWER EXTRIMITY;  Surgeon: Angelia Mould, MD;  Location: [facility];  Service: Vascular;  Laterality: Left;  . IR ANGIOGRAM FOLLOW UP STUDY  12/11/2017  . IR GENERIC HISTORICAL  10/24/2016   IR ANGIOGRAM FOLLOW UP STUDY  . LOWER EXTREMITY ANGIOGRAM Bilateral 07/30/2015   Procedure: Lower Extremity Angiogram;  Surgeon: Conrad Burley, MD;  Location: [facility] [facility];  Service: Cardiovascular;  Laterality: Bilateral;  . [place]   "lower"  . PERIPHERAL VASCULAR CATHETERIZATION N/A 07/30/2015   Procedure: Abdominal Aortogram;  Surgeon: Conrad Villanueva, MD;  Location: [facility];  Service: Cardiovascular;  Laterality: N/A;  . PERIPHERAL VASCULAR CATHETERIZATION N/A 10/24/2016   Procedure: Abdominal Aortogram;  Surgeon: Angelia Mould, MD;  Location: [place];  Service: Cardiovascular;  Laterality: N/A;  . PERIPHERAL VASCULAR CATHETERIZATION N/A 10/24/2016   Procedure: Lower Extremity Angiography;  Surgeon: Angelia Mould, MD;  Location: [place] [facility];  Service: Cardiovascular;  Laterality: N/A;  . PERIPHERAL VASCULAR INTERVENTION Right 12/11/2017   Procedure: PERIPHERAL VASCULAR INTERVENTION;  Surgeon: Angelia Mould, MD;  Location: [place] [facility];  Service: Cardiovascular;  Laterality: Right;  Marland Kitchen VASCULAR SURGERY  ~ 2007   Stent SFA   . VEIN HARVEST Left 01/06/2016   Procedure: LEFT GREATER SAPPHENOUS VEIN HARVEST;  Surgeon: Mal Misty, MD;  Location: [place];  Service: Vascular;  Laterality: Left;    Past medical history, surgical, family, and social history reviewed and updated in the EMR as appropriate.  Objective:  BP (!) 148/72   Pulse 63   Temp 98.2 F (36.8 C) (Oral)   Ht 5\' 7"  (1.702 m)   Wt 193 lb 3.2 oz (87.6 kg)   SpO2 98%   BMI 30.26 kg/m   Vitals and nursing note reviewed  General: NAD, pleasant,  able to participate in exam, patient has odor of tobacco Neck: negative for cervical or supraclavicular lymphadenitis, non-tender to palpation Cardiac: RRR, S1 S2 present. normal heart sounds, no murmurs. Respiratory: CTAB, normal effort, No wheezes, rales or rhonchi Extremities: no edema or cyanosis. Skin: warm and dry, no rashes noted Neuro: alert, no obvious focal deficits Psych: Normal affect and mood   Assessment & Plan:    HYPERTENSION, BENIGN SYSTEMIC Well controlled on current medication. 148/72 today Continue current  F/u 6 months  Diabetic neuropathy Patient taking gabapentin at night and has had improvement in night time symptoms and sleep quality He states his pharmacy has not filled the new prescription from last appointment.  I see the prescription was sent and confirmed receipt in chart.  I will personally call the pharmacy today since patient is completely out of prescription today  Lung nodule < 6cm on CT Patient plans to go to follow up chest CT tomorrow I set a reminder to review the results and inform patient asap  Tobacco abuse Patient has no intention at this time to decrease tobacco use He is open to receiving the resource for quitting and discussing again  Doristine Mango,  [place] PGY-1

## 2019-01-28 NOTE — Patient Instructions (Signed)
It was a pleasure to see you today!  To summarize our discussion for this visit:  You have a chest CT tomorrow. I will call or mail you will results.  1-800-QUITNOW can give you information and support for quitting smoking.  I will call your pharmacy to figure out why your gabapentin hasn't been filled.   We discussed your activity level and getting at 30 minutes of activity per day  Please return to our clinic to see me in 6 months for follow up. Call the clinic at [phone] if your symptoms worsen or you have any concerns.  Thank you for allowing me to take part in your care,  Dr. Doristine Mango   Thanks for choosing [facility] for your primary care.

## 2019-01-28 NOTE — Assessment & Plan Note (Signed)
Well controlled on current medication. 148/72 today Continue current  F/u 6 months

## 2019-01-28 NOTE — Assessment & Plan Note (Signed)
Patient taking gabapentin at night and has had improvement in night time symptoms and sleep quality He states his pharmacy has not filled the new prescription from last appointment.  I see the prescription was sent and confirmed receipt in chart.  I will personally call the pharmacy today since patient is completely out of prescription today

## 2019-01-28 NOTE — Assessment & Plan Note (Signed)
Patient has no intention at this time to decrease tobacco use He is open to receiving the resource for quitting and discussing again

## 2019-01-28 NOTE — Assessment & Plan Note (Signed)
Patient plans to go to follow up chest CT tomorrow I set a reminder to review the results and inform patient asap

## 2019-01-29 ENCOUNTER — Ambulatory Visit (HOSPITAL_COMMUNITY)
Admission: RE | Admit: 2019-01-29 | Discharge: 2019-01-29 | Disposition: A | Discharge status: Home / Self Care | Payer: Medicare Other | Source: Ambulatory Visit | Attending: Family Medicine | Admitting: Family Medicine

## 2019-01-29 DIAGNOSIS — R918 Other nonspecific abnormal finding of lung field: Secondary | ICD-10-CM | POA: Insufficient documentation

## 2019-01-29 DIAGNOSIS — J9811 Atelectasis: Secondary | ICD-10-CM | POA: Diagnosis not present

## 2019-01-29 DIAGNOSIS — R911 Solitary pulmonary nodule: Secondary | ICD-10-CM

## 2019-01-29 DIAGNOSIS — J342 Deviated nasal septum: Secondary | ICD-10-CM | POA: Diagnosis not present

## 2019-02-20 ENCOUNTER — Ambulatory Visit: Payer: Medicare Other | Attending: Student in an Organized Health Care Education/Training Program | Admitting: Audiology

## 2019-02-20 DIAGNOSIS — I10 Essential (primary) hypertension: Secondary | ICD-10-CM | POA: Insufficient documentation

## 2019-02-20 DIAGNOSIS — H93299 Other abnormal auditory perceptions, unspecified ear: Secondary | ICD-10-CM | POA: Diagnosis not present

## 2019-02-20 DIAGNOSIS — E119 Type 2 diabetes mellitus without complications: Secondary | ICD-10-CM | POA: Insufficient documentation

## 2019-02-20 DIAGNOSIS — H9193 Unspecified hearing loss, bilateral: Secondary | ICD-10-CM

## 2019-02-20 DIAGNOSIS — Z570 Occupational exposure to noise: Secondary | ICD-10-CM | POA: Diagnosis not present

## 2019-02-20 DIAGNOSIS — H906 Mixed conductive and sensorineural hearing loss, bilateral: Secondary | ICD-10-CM

## 2019-02-20 DIAGNOSIS — H9319 Tinnitus, unspecified ear: Secondary | ICD-10-CM | POA: Insufficient documentation

## 2019-02-20 NOTE — Procedures (Signed)
Outpatient Audiology and [place]  [place], [place] [address]  [phone]   Audiological Evaluation Patient Name: [name]   Status: Outpatient   DOB: [birth year]    Diagnosis: Hearing Loss                 MRN: [id number] Date:  02/20/2019     Referent: Richarda Osmond, DO  History: JAYTHAN HINELY was seen for an audiological evaluation. Primary Concern: Increased difficulty hearing over the past year. Pain: None History of ear infections:   N History of dizziness/vertigo:  N History of balance issues:  Y - "had surgery on legs one year ago, but has been unbalanced for the past 6 months".  If get up fast, feel like blacking out, feel like falling to the right side. Tinnitus: Occational -high pitched. 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 History of hypertension: Y - treated medicattion. History of diabetes:  Y- treated with medication. Family history of hearing loss:  Y  Evaluation: Conventional pure tone audiometry from 250Hz  - 8000Hz  with using insert earphones.  Hearing Thresholds are symmetrical ranging from 15 dBHL from 250Hz  - 1000Hz ; 25 dBHL from 2000Hz  - 4000Hz  and 25-35 dBHL from 6000Hz  - 8000Hz  bilaterally. Masked bone conduction shows a mixed hearing los in the high frequencies bilaterally. Note that bone conduction is poorer than air conduction at 2000Hz  - a soft sign associated with otosclerosis.  Monitoring of hearing is needed. Reliability is good Speech reception levels (repeating words near threshold) using recorded spondee word lists:  Right ear: 20 dBHL.  Left ear:  20 dBHL Word recognition (at comfortably loud volumes) using recorded NU-6 word lists at 60 dBHL, in quiet.  Right ear: 100%.  Left ear:   96% Word recognition in minimal background noise using PBK word lists in recorded multitalker noise:  +5 dBHL  Right ear: 72%                           Left ear:  68%  Tympanometry (middle ear function) with ipsilateral acoustic reflexes.  Right ear: Normal (Type A).  Left ear: Normal (Type A).  CONCLUSION:      Karrington has normal hearing in the low and mid range frequencies with a slight to borderline mild hearing loss in the high frequencies bilaterally. 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 However, as discussed with Jearld Adjutant, he is currently not a hearing aid candidate.    Today Garlin has excellent word recognition in quiet that drops in minimal background noise to fair on the right and fair to borderline poor on the left.  Missing a some of what is said in most social settings is expected.   Of concern is that Francis has noticed an increase in poor balance and feels that he is falling to the "right side" when he gets up rapidly. Referral to physical therapy for a balance assessment was discussed but he wants to wait and discuss this with his physicians.  The test results were discussed and Jearld Adjutant counseled.  RECOMMENDATIONS: 1.   Monitor hearing high frequency mixed loss, especially masked bone conduction at  2000Hz  and word recognition in background noise with a repeat audiological evaluation in 6 months (earlier if there is any change in hearing or ear pressure).  This appointment has been scheduled in July 2020. 2.   Consider referral to physical therapy for a balance assessment (located at [facility]). 3.  Strategies that help improve hearing in background noise include: A) Face the speaker directly. Optimal is having the speakers face well - lit.  Unless amplified, being within 3-6 feet of the speaker will enhance word recognition. B) Avoid having the speaker back-lit as  this will minimize the ability to use cues from lip-reading, facial expression and gestures. C)  Word recognition is poorer in background noise. For optimal word recognition, turn off the TV, radio or noisy fan when engaging in conversation. In a restaurant, try to sit away from noise sources and close to the primary speaker.  D)  Ask for topic clarification from time to time in order to remain in the conversation.  Most people don't mind repeating or clarifying a point when asked.  If needed, explain the difficulty hearing in background noise or hearing loss.  Jailyne Chieffo L. Heide Spark, Au.D., CCC-A Doctor of Audiology  02/20/2019   cc: Anderson, Chelsey L, DO \

## 2019-02-20 NOTE — Patient Instructions (Signed)
[name] has normal hearing thresholds in the low frequencies sloping to a slight to mild high frequency hearing loss with a mixed component.  Middle ear pressure is within normal limits in each ear.  [name] has excellent word recognition in quiet at conversational speech levels. In minimal background noise his word recognition drops to fair in each ear. It is expected that [name] will miss 30% of what is said in most social settings, possibly more with fluctuating background noise.  Since he has noticed a change in blance and hearing in the past 6 months a repeat hearing evaluation is recommended.  Deborah L. Heide Spark, Au.D., CCC-A Doctor of Audiology 02/20/2019

## 2019-02-23 ENCOUNTER — Other Ambulatory Visit: Payer: Self-pay | Admitting: Student in an Organized Health Care Education/Training Program

## 2019-02-23 DIAGNOSIS — I10 Essential (primary) hypertension: Secondary | ICD-10-CM

## 2019-02-23 DIAGNOSIS — M5412 Radiculopathy, cervical region: Secondary | ICD-10-CM

## 2019-02-27 ENCOUNTER — Other Ambulatory Visit: Payer: Self-pay | Admitting: Student in an Organized Health Care Education/Training Program

## 2019-03-08 ENCOUNTER — Other Ambulatory Visit: Payer: Self-pay | Admitting: Student in an Organized Health Care Education/Training Program

## 2019-03-08 DIAGNOSIS — E7800 Pure hypercholesterolemia, unspecified: Secondary | ICD-10-CM

## 2019-03-08 NOTE — Progress Notes (Signed)
I have reviewed this visit and agree with the documentation.   

## 2019-03-11 ENCOUNTER — Other Ambulatory Visit: Payer: Self-pay | Admitting: *Deleted

## 2019-03-19 ENCOUNTER — Encounter: Payer: Self-pay | Admitting: Student in an Organized Health Care Education/Training Program

## 2019-03-19 ENCOUNTER — Ambulatory Visit (INDEPENDENT_AMBULATORY_CARE_PROVIDER_SITE_OTHER): Payer: Medicare Other | Admitting: Student in an Organized Health Care Education/Training Program

## 2019-03-19 ENCOUNTER — Other Ambulatory Visit: Payer: Self-pay

## 2019-03-19 DIAGNOSIS — I70221 Atherosclerosis of native arteries of extremities with rest pain, right leg: Secondary | ICD-10-CM | POA: Diagnosis not present

## 2019-03-19 DIAGNOSIS — W19XXXA Unspecified fall, initial encounter: Secondary | ICD-10-CM | POA: Insufficient documentation

## 2019-03-19 MED ORDER — ACETAMINOPHEN 500 MG PO TABS: 1000.0000 mg | ORAL_TABLET | Freq: Three times a day (TID) | ORAL | 0 refills | Status: DC | PRN

## 2019-03-19 MED ORDER — GABAPENTIN 100 MG PO CAPS: 300.0000 mg | ORAL_CAPSULE | Freq: Every day | ORAL | 1 refills | Status: DC

## 2019-03-19 NOTE — Patient Instructions (Addendum)
It was a pleasure to see you today!  To summarize our discussion for this visit:  We will increase your gabapentin dose 300mg  every day  Increase tylenol dose 4,000mg  per day max.   Call the clinic at [phone] if your symptoms worsen or you have any concerns.  Thank you for allowing me to take part in your care,  Dr. Doristine Mango   Thanks for choosing [facility] for your primary care.

## 2019-03-19 NOTE — Assessment & Plan Note (Addendum)
Prescribed tylenol 1,000mg  to take 3-4 times per day as needed Increased gabapentin dose 300mg  nightly Call by end of week if not improving to reevaluate  Encouraged and urged him to go to hospital and get head imaging if he ever has a head injury and blacks out

## 2019-03-19 NOTE — Progress Notes (Signed)
   Subjective:    Patient ID: [name], male    DOB: 1951/11/02, 68 y.o.   MRN: [id number]   CC: mechanical fall and injury  HPI: Patient had mechanical fall about 2 weeks ago. His cane slipped and he fell from standing height to ground. He landed on right side of body and hit head. He states that he thinks he blacked out but does not know how long. Fall was not witnessed and he did not have exam or imaging after fall. His glasses broke in the fall. He had a bump on his forehead but no skin tear or bleeding, or bruising from the fall. He has been able to ambulate like normal since the injury. His pain is mostly along his right base of neck/scapula area and radiates up near his ear when he lifts something heavy with his right arm. He has tried advil at home with some relieve. The pain has interfered with his sleep and has had difficulty with falling and staying asleep 2/2 pain. Since the fall, his pain has been gradually improving.  S/p Head injury- He has not had headache, visual changes, memory difficulty, or difficulty with balance.   Smoking status reviewed   ROS: pertinent noted in the HPI   Past medical history, surgical, family, and social history reviewed and updated in the EMR as appropriate.  Objective:  BP 122/72   Pulse 68   Temp 97.6 F (36.4 C) (Axillary)   Ht 5\' 7"  (1.702 m)   Wt 194 lb 3.2 oz (88.1 kg)   SpO2 98%   BMI 30.42 kg/m   Vitals and nursing note reviewed  General: NAD, pleasant, able to participate in exam Cardiac: RRR, S1 S2 present. normal heart sounds, no murmurs. Respiratory: CTAB, normal effort, No wheezes, rales or rhonchi MSK: Right shoulder has full aROM with pain illicited in raising in coronal and sagittal planes above about 90 degrees. Patient had small area of sharp tenderness to palpation in soft tissue in Right latissimus area. There are no malformations, skin changes, edema, crepitus. No tenderness to lower extremities.  Head: No  bruising or swelling or mobility to skull bones. Extremities: no edema or cyanosis. Skin: warm and dry, no rashes noted Neuro: alert, no obvious focal deficits, analgesic ambulation without apparent balance issues Psych: Normal affect and mood   Assessment & Plan:    Fall with injury Prescribed tylenol 1,000mg  to take 3-4 times per day as needed Increased gabapentin dose 300mg  nightly Call by end of week if not improving to reevaluate  Asked patient to call 911 and be evaluated in ED if he ever again has a head injury like this, especially if LOC.- patient was agreeable.   Doristine Mango, [place] [facility] PGY-1

## 2019-04-02 ENCOUNTER — Other Ambulatory Visit: Payer: Self-pay | Admitting: Student in an Organized Health Care Education/Training Program

## 2019-04-15 ENCOUNTER — Other Ambulatory Visit: Payer: Self-pay | Admitting: Student in an Organized Health Care Education/Training Program

## 2019-04-18 ENCOUNTER — Other Ambulatory Visit: Payer: Self-pay | Admitting: Internal Medicine

## 2019-04-18 ENCOUNTER — Telehealth: Payer: Self-pay | Admitting: Licensed Clinical Social Worker

## 2019-04-18 NOTE — Telephone Encounter (Signed)
  Care Coordination  Telephone Outreach Note  04/18/2019 Name: [name]      MRN: [id number]          DOB: 1951-12-17  Reason for referral : Care Coordination (check-in ) I reached out to [name] today by phone in response to a referral sent by [name]  Review of patient status, including review of consultants notes from with appropriate care team members was performed as part of patient evaluation and provision for care coordination referral.   Report of symptoms/concerns: getting low on some of his medications.  ASSESSMENT: [name] is a 68 y.o. year old male who sees Richarda Osmond, DO for primary care. Patient is pleasant and engaged in conversation. No concerns with food. Brother takes him to the store. Patient reports feeling well and taking all medication as prescribed.  Still a little sore from the fall he took in March.  Reports no additional falls. Patient states able to preform ADL's with no problem. LIFE CONTEXT: Family and Social: lives alone, support from brother Strengths: taking precaution to prevent Covid-19  INTERVENTION: Client interviewed and appropriate assessments performed. Discussed hand washing and face coving when going to the store ; also utilized engagement as part of my intervention.  Other interventions include: task center with obtaining refills on medications that are getting low. SDOH (Social Determinants of Health) screening also performed today.  challenges identified: None  Follow up [name]: The patient will call is mail order pharmacy* as advised to check on his medication.  No further follow up required: at this time  1. The patient will call is mail order pharmacy* as advised to check on his medication.  2.  Patient will call FM office if assistance is needed 3.  No further follow up required from LCSW at this time  Richarda Osmond, DO has been notified of this outreach and [name]  [name].   Casimer Lanius, LCSW [facility]   [phone] 10:45 AM

## 2019-05-14 ENCOUNTER — Telehealth: Payer: Self-pay

## 2019-05-14 ENCOUNTER — Other Ambulatory Visit: Payer: Self-pay

## 2019-05-14 DIAGNOSIS — I739 Peripheral vascular disease, unspecified: Secondary | ICD-10-CM

## 2019-05-14 NOTE — Telephone Encounter (Signed)
Pt called and said that he is having increased leg pain and muscle spasms. He said that it is in both legs but worse in the right.   Appt made for pt to come in tomorrow and have some studies done and see the dr.   York Cerise, CMA

## 2019-05-15 ENCOUNTER — Encounter: Payer: Self-pay | Admitting: Vascular Surgery

## 2019-05-15 ENCOUNTER — Ambulatory Visit (INDEPENDENT_AMBULATORY_CARE_PROVIDER_SITE_OTHER): Payer: Medicare Other | Admitting: Vascular Surgery

## 2019-05-15 ENCOUNTER — Ambulatory Visit (HOSPITAL_COMMUNITY)
Admission: RE | Admit: 2019-05-15 | Discharge: 2019-05-15 | Disposition: A | Discharge status: Home / Self Care | Payer: Medicare Other | Source: Ambulatory Visit | Attending: Family | Admitting: Family

## 2019-05-15 ENCOUNTER — Other Ambulatory Visit: Payer: Self-pay

## 2019-05-15 ENCOUNTER — Ambulatory Visit (INDEPENDENT_AMBULATORY_CARE_PROVIDER_SITE_OTHER)
Admission: RE | Admit: 2019-05-15 | Discharge: 2019-05-15 | Disposition: A | Discharge status: Home / Self Care | Payer: Medicare Other | Source: Ambulatory Visit | Attending: Vascular Surgery | Admitting: Vascular Surgery

## 2019-05-15 ENCOUNTER — Ambulatory Visit (INDEPENDENT_AMBULATORY_CARE_PROVIDER_SITE_OTHER)
Admission: RE | Admit: 2019-05-15 | Discharge: 2019-05-15 | Disposition: A | Discharge status: Home / Self Care | Payer: Medicare Other | Source: Ambulatory Visit | Attending: Family | Admitting: Family

## 2019-05-15 VITALS — BP 154/73 | HR 56 | Temp 97.6°F | Resp 20 | Ht 67.0 in | Wt 190.0 lb

## 2019-05-15 DIAGNOSIS — I739 Peripheral vascular disease, unspecified: Secondary | ICD-10-CM

## 2019-05-15 DIAGNOSIS — I70221 Atherosclerosis of native arteries of extremities with rest pain, right leg: Secondary | ICD-10-CM

## 2019-05-15 NOTE — Progress Notes (Addendum)
Patient name: [name] MRN: [id number] DOB: 1951/10/10 Sex: male  REASON FOR VISIT:   Follow-up of peripheral vascular disease  HPI:   [name] is a pleasant 68 y.o. male who I last saw him on 01/16/2019.  This patient underwent angioplasty and stenting of a right external iliac artery with a VBX stent on 12/11/2017.  At the time of his last visit patient had some stenosis in the proximal aspect of the stent but biphasic flow throughout.  ABI on the right was 64%.  ABI on the left was 70%.   The patient also has a left femoral to peroneal artery bypass which was patent with no areas of stenosis within the graft.  I also heard a right carotid bruit at the time of his last visit.  He denies any history of stroke, TIAs, expressive or receptive aphasia, or amaurosis fugax.  Since I saw him last his chief complaint is cramps at night in his calves.  He does not describe rest pain in his feet.  I do not get any clear-cut history of claudication.  He has had no issues with nonhealing wounds.  He continues to smoke cigars.  Past Medical History:  Diagnosis Date  . Anemia   . Arthritis    OA  . Cervical radiculopathy    Dr. Vertell Limber neurosurgery  . Chronic lower back pain   . Diabetes mellitus    takes Metformin daily  . GERD (gastroesophageal reflux disease)    takes Protonix daily  . History of blood transfusion    "related to low HgB" ((09/10/2015  . Hyperlipidemia    takes Vytorin daily  . Hypertension    takes Benazepril and Bystolic daily  . PAD (peripheral artery disease) ([place])   . Pneumonia   . Shortness of breath dyspnea    with exertion  . Tobacco user   . Toe fracture, right 05/09/2011    Family History  Problem Relation Age of Onset  . Cancer Mother        colon Cancer  . Hyperlipidemia Mother   . Diabetes Mother   . Heart disease Father   . Hypertension Father   . Cancer Sister        Uterine  . Diabetes Sister   . Diabetes Brother     SOCIAL  HISTORY: Social History   Tobacco Use  . Smoking status: Current Some Day Smoker    Years: 24.00    Types: Cigars  . Smokeless tobacco: Never Used  . Tobacco comment: smokes 1 cigar a day  Substance Use Topics  . Alcohol use: Yes    Alcohol/week: 2.0 standard drinks    Types: 2 Cans of beer per week    Allergies  Allergen Reactions  . Glipizide Other (See Comments)    REACTION IS SIDE EFFECT Severe hypoglycemia to 40s.     Current Outpatient Medications  Medication Sig Dispense Refill  . atorvastatin (LIPITOR) 40 MG tablet TAKE 1 TABLET BY MOUTH DAILY 90 tablet 2  . benazepril (LOTENSIN) 10 MG tablet TAKE 1 TABLET BY MOUTH EVERY DAY 90 tablet 1  . clopidogrel (PLAVIX) 75 MG tablet TAKE 1 TABLET BY MOUTH EVERY DAY 90 tablet 1  . [facility] ACETAMINOPHEN EX ST 500 MG tablet TAKE 2 TABLETS (1,000 MG TOTAL) BY MOUTH EVERY 8 (EIGHT) HOURS AS NEEDED FOR MODERATE PAIN. 90 tablet 0  . [facility] SENNA 8.6 MG tablet TAKE 1 TABLET (8.6 MG TOTAL) BY MOUTH AT BEDTIME.    Marland Kitchen  ezetimibe (ZETIA) 10 MG tablet TAKE 1 TABLET BY MOUTH ONCE DAILY 90 tablet 2  . FLUoxetine (PROZAC) 10 MG capsule TAKE 1 CAPSULE BY MOUTH ONCE DAILY 90 capsule 2  . gabapentin (NEURONTIN) 100 MG capsule TAKE 1 CAPSULE (100 MG TOTAL) BY MOUTH AT BEDTIME. 90 capsule 1  . gabapentin (NEURONTIN) 100 MG capsule Take 3 capsules (300 mg total) by mouth at bedtime. 90 capsule 1  . metFORMIN (GLUCOPHAGE) 1000 MG tablet TAKE ONE (1) TABLET BY MOUTH TWICE DAILY WITH A MEAL 180 tablet 1  . naproxen sodium (ALEVE) 220 MG tablet Take 220 mg by mouth.    . pantoprazole (PROTONIX) 40 MG tablet TAKE 1 TABLET BY MOUTH ONCE DAILY 90 tablet 1  . ranitidine (ZANTAC 75) 75 MG tablet Take 1 tablet (75 mg total) by mouth 2 (two) times daily as needed for heartburn (heartburn, acid reflux). 30 tablet 0  . benazepril (LOTENSIN) 5 MG tablet      No current facility-administered medications for this visit.     REVIEW OF SYSTEMS:  [X]  denotes positive  finding, [ ]  denotes negative finding Cardiac  Comments:  Chest pain or chest pressure:    Shortness of breath upon exertion:    Short of breath when lying flat:    Irregular heart rhythm:        Vascular    Pain in calf, thigh, or hip brought on by ambulation:    Pain in feet at night that wakes you up from your sleep:     Blood clot in your veins:    Leg swelling:         Pulmonary    Oxygen at home:    Productive cough:     Wheezing:         Neurologic    Sudden weakness in arms or legs:     Sudden numbness in arms or legs:     Sudden onset of difficulty speaking or slurred speech:    Temporary loss of vision in one eye:     Problems with dizziness:         Gastrointestinal    Blood in stool:     Vomited blood:         Genitourinary    Burning when urinating:     Blood in urine:        Psychiatric    Major depression:         Hematologic    Bleeding problems:    Problems with blood clotting too easily:        Skin    Rashes or ulcers:        Constitutional    Fever or chills:     PHYSICAL EXAM:   Vitals:   05/15/19 1011  BP: (!) 154/73  Pulse: (!) 56  Resp: 20  Temp: 97.6 F (36.4 C)  SpO2: 100%  Weight: 190 lb (86.2 kg)  Height: 5\' 7"  (1.702 m)    GENERAL: The patient is a well-nourished male, in no acute distress. The vital signs are documented above. CARDIAC: There is a regular rate and rhythm.  VASCULAR: He has a right carotid bruit. He has palpable femoral pulses. I cannot palpate pedal pulses however both feet are warm and well-perfused. PULMONARY: There is good air exchange bilaterally without wheezing or rales. ABDOMEN: Soft and non-tender with normal pitched bowel sounds.  MUSCULOSKELETAL: There are no major deformities or cyanosis. NEUROLOGIC: No focal weakness or paresthesias are detected. SKIN: There  are no ulcers or rashes noted. PSYCHIATRIC: The patient has a normal affect.  DATA:    ARTERIAL DOPPLER STUDY: I have  independently interpreted his arterial Doppler study today.  On the right side there is a monophasic dorsalis pedis and posterior tibial signal.  ABI is 55%.  Toe pressure is 54 mmHg.  On the left side there is a biphasic posterior tibial signal with a monophasic dorsalis pedis signal.  ABI is 100%.  Toe pressure is 170 mmHg.  GRAFT DUPLEX: I have independently interpreted his graft duplex scan today.  His left femoral to peroneal artery bypass graft is patent with biphasic flow throughout and no areas of stenosis noted within the graft.  DUPLEX RIGHT EXTERNAL ILIAC ARTERY STENT: I have independently interpreted his duplex of his stent in the right external iliac artery.  The peak systolic velocity in the proximal stent is 270 cm/s.  There is biphasic flow throughout the stent.  MEDICAL ISSUES:   STATUS POST RIGHT EXTERNAL ILIAC ARTERY STENT: His stent is patent with stable velocities.  There is some mild narrowing in the proximal stent which is been stable.  The velocities of actually improved.  He is on Plavix and a statin.  I have ordered a follow-up duplex and ABIs in 1 year.  I encouraged him to stay as active as possible.  STATUS POST LEFT FEMORAL PERONEAL BYPASS: His bypass graft is patent with no areas of concern.  I have ordered a graft duplex and ABIs in 1 year.  Have encouraged him to stay as active as possible.    CRAMPS: The patient has been having cramps at night in his calves but I explained that I did not think this was related to his peripheral vascular disease.  We discussed possibly taking quinine which I think is helpful for patients with cramps.  Otherwise he would need a work-up for potential electrolyte abnormalities.  I reassured him however that I do not think this is related to poor circulation.  RIGHT CAROTID BRUIT: His previous duplex scan showed no significant internal carotid disease.  He had some disease in the external carotid artery which likely explains his bruit.   VASCULAR QUALITY INITIATIVE: The patient is on a statin.  He is not on aspirin because he is taking Plavix and was told not to take both.  Deitra Mayo [facility] [phone]

## 2019-07-16 ENCOUNTER — Ambulatory Visit: Payer: Medicare Other | Attending: Student in an Organized Health Care Education/Training Program | Admitting: Audiology

## 2019-07-23 ENCOUNTER — Other Ambulatory Visit: Payer: Self-pay | Admitting: Student in an Organized Health Care Education/Training Program

## 2019-08-19 ENCOUNTER — Other Ambulatory Visit: Payer: Self-pay | Admitting: Student in an Organized Health Care Education/Training Program

## 2019-08-22 ENCOUNTER — Other Ambulatory Visit: Payer: Self-pay

## 2019-08-22 ENCOUNTER — Ambulatory Visit (INDEPENDENT_AMBULATORY_CARE_PROVIDER_SITE_OTHER): Payer: Medicare Other | Admitting: *Deleted

## 2019-08-22 DIAGNOSIS — Z23 Encounter for immunization: Secondary | ICD-10-CM

## 2019-08-22 NOTE — Progress Notes (Signed)
Pt tolerated vaccine well. Alik Mawson, CMA  

## 2019-08-29 ENCOUNTER — Other Ambulatory Visit: Payer: Self-pay | Admitting: Student in an Organized Health Care Education/Training Program

## 2019-08-29 DIAGNOSIS — I10 Essential (primary) hypertension: Secondary | ICD-10-CM

## 2019-08-29 NOTE — Telephone Encounter (Signed)
Patient has tried to get Benzapril 10 mg filled to [facility] but is having trouble.  He needs this medication filled asap as he is out of it.  If you need to contact him, the best # is [phone].

## 2019-09-03 MED ORDER — BENAZEPRIL HCL 10 MG PO TABS: 10.0000 mg | ORAL_TABLET | Freq: Every day | ORAL | 1 refills | Status: DC

## 2019-09-11 ENCOUNTER — Other Ambulatory Visit: Payer: Self-pay | Admitting: Student in an Organized Health Care Education/Training Program

## 2019-10-18 ENCOUNTER — Other Ambulatory Visit: Payer: Self-pay

## 2019-10-21 MED ORDER — CLOPIDOGREL BISULFATE 75 MG PO TABS: 75.0000 mg | ORAL_TABLET | Freq: Every day | ORAL | 1 refills | Status: DC

## 2019-11-04 ENCOUNTER — Telehealth: Payer: Self-pay

## 2019-11-04 NOTE — Telephone Encounter (Signed)
Pt called and said that he has been having leg pain for 2 weeks and he needs to be seen.   Called pt and asked if it was the same cramps that he had complained about at his last visit. He said no, this is a different kind of pain and he is also having issues with his foot being cold.   Appt made for pt to be seen.   York Cerise, CMA

## 2019-11-06 ENCOUNTER — Other Ambulatory Visit: Payer: Self-pay

## 2019-11-06 ENCOUNTER — Ambulatory Visit (INDEPENDENT_AMBULATORY_CARE_PROVIDER_SITE_OTHER): Payer: Medicare Other | Admitting: Family

## 2019-11-06 ENCOUNTER — Encounter: Payer: Self-pay | Admitting: Family

## 2019-11-06 ENCOUNTER — Telehealth: Payer: Self-pay | Admitting: *Deleted

## 2019-11-06 VITALS — BP 124/72 | HR 68 | Temp 97.7°F | Resp 20 | Ht 67.0 in | Wt 187.8 lb

## 2019-11-06 DIAGNOSIS — M79606 Pain in leg, unspecified: Secondary | ICD-10-CM

## 2019-11-06 DIAGNOSIS — I779 Disorder of arteries and arterioles, unspecified: Secondary | ICD-10-CM

## 2019-11-06 NOTE — Patient Instructions (Signed)

## 2019-11-06 NOTE — Progress Notes (Signed)
Pt left before I could see him. I messaged [facility] admin schedulers to schedule pt for ABI's and arterial duplex of the symptomatic leg, and see Dr. Scot Dock.

## 2019-11-06 NOTE — Telephone Encounter (Signed)
Attempted to call pt. To make new appt. Due to pt. Left without being seen With labs and MD no answer LVM to let pt. Know that another appt. Is needing with ABI+B LE ART f/u [facility] MD

## 2019-11-12 ENCOUNTER — Other Ambulatory Visit: Payer: Self-pay

## 2019-11-12 DIAGNOSIS — I779 Disorder of arteries and arterioles, unspecified: Secondary | ICD-10-CM

## 2019-11-18 ENCOUNTER — Other Ambulatory Visit: Payer: Self-pay

## 2019-11-18 ENCOUNTER — Encounter: Payer: Self-pay | Admitting: Family Medicine

## 2019-11-18 ENCOUNTER — Ambulatory Visit
Admission: RE | Admit: 2019-11-18 | Discharge: 2019-11-18 | Disposition: A | Discharge status: Home / Self Care | Payer: Medicare Other | Source: Ambulatory Visit | Attending: Family Medicine | Admitting: Family Medicine

## 2019-11-18 ENCOUNTER — Other Ambulatory Visit: Payer: Self-pay | Admitting: Student in an Organized Health Care Education/Training Program

## 2019-11-18 ENCOUNTER — Ambulatory Visit (INDEPENDENT_AMBULATORY_CARE_PROVIDER_SITE_OTHER): Payer: Medicare Other | Admitting: Family Medicine

## 2019-11-18 VITALS — BP 140/72 | HR 76 | Wt 188.0 lb

## 2019-11-18 DIAGNOSIS — E118 Type 2 diabetes mellitus with unspecified complications: Secondary | ICD-10-CM

## 2019-11-18 DIAGNOSIS — I779 Disorder of arteries and arterioles, unspecified: Secondary | ICD-10-CM

## 2019-11-18 DIAGNOSIS — M25512 Pain in left shoulder: Secondary | ICD-10-CM | POA: Insufficient documentation

## 2019-11-18 DIAGNOSIS — S4992XA Unspecified injury of left shoulder and upper arm, initial encounter: Secondary | ICD-10-CM | POA: Diagnosis not present

## 2019-11-18 LAB — POCT GLYCOSYLATED HEMOGLOBIN (HGB A1C): HbA1c, POC (controlled diabetic range): 6.8 % (ref 0.0–7.0)

## 2019-11-18 NOTE — Progress Notes (Signed)
    Subjective:  [name] is a 68 y.o. male who presents to the [facility] today with a chief complaint of left shoulder pain.   HPI: Acute pain of left shoulder Patient claims he had a mechanical fall back into his left when he reached his arm behind him to try to catch himself he said that he hit his left shoulder.  This was due to going down the steps and he felt like his left leg gave out which is a known problem as he walks with a cane already.  He denies hitting his head and says that since then he has had shoulder pain and difficulty with his normal range of motion.  He claims he has no distal neurological deficits.  Type 2 diabetes with complication [facility]) Patient is overdue for an A1c and contents to get that redrawn today, said he did get a postcard recently from his eye doctor saying that it was time for an annual eye exam and he agrees to go get that done and says he will try to bring the records to [place].   Objective:  Physical Exam: BP 140/72   Pulse 76   Wt 188 lb (85.3 kg)   SpO2 98%   BMI 29.44 kg/m   Gen: NAD, walks with cane CV: regular rate Pulm: NWOB, no cough MSK: no edema, cyanosis, or clubbing noted.  Left shoulder with less then 90 deg flexion and abduction, visibly painful with movement but no bony abnormality felt. Skin: warm, dry Neuro: grossly normal, moves all extremities Psych: Normal affect and thought content  No results found for this or any previous visit (from the past 72 hour(s)).   Assessment/Plan:  Acute pain of left shoulder Patient claims he had a mechanical fall back into his left when he reached his arm behind him to try to catch himself he said that he hit his left shoulder.  This was due to going down the steps and he felt like his left leg gave out which is a known problem as he walks with a cane already.  He denies hitting his head and says that since then he has had shoulder pain and difficulty with his normal range of motion.  He has no  distal neurological deficits.  Patient does not want to go to physical therapy but consents to a x-ray of his left shoulder to ensure there are no fractures.  Type 2 diabetes with complication [facility]) Patient is overdue for an A1c and contents to get that redrawn today, said he did get a postcard recently from his eye doctor saying that it was time for an annual eye exam and he agrees to go get that done and says he will try to bring the records to [place].   Sherene Sires, DO FAMILY MEDICINE RESIDENT - PGY3 11/18/2019 1:49 PM

## 2019-11-18 NOTE — Assessment & Plan Note (Signed)
Patient is overdue for an A1c and contents to get that redrawn today, said he did get a postcard recently from his eye doctor saying that it was time for an annual eye exam and he agrees to go get that done and says he will try to bring the records to [place].

## 2019-11-18 NOTE — Assessment & Plan Note (Signed)
Patient claims he had a mechanical fall back into his left when he reached his arm behind him to try to catch himself he said that he hit his left shoulder.  This was due to going down the steps and he felt like his left leg gave out which is a known problem as he walks with a cane already.  He denies hitting his head and says that since then he has had shoulder pain and difficulty with his normal range of motion.  He has no distal neurological deficits.  Patient does not want to go to physical therapy but consents to a x-ray of his left shoulder to ensure there are no fractures.

## 2019-11-18 NOTE — Patient Instructions (Signed)
It was a pleasure to see you today! Thank you for choosing [facility] for your primary care. [name] was seen for left shoulder pain. Come back to the clinic if there is anything we can do for you or your shoulder does not get better over the next week.  Today we ordered an x-ray of your left shoulder, after we show that there is no fracture it is important you try and maintain mobility.  We also took an A1c to check your diabetes status as well as went over the fact that you need to go to your eye doctor and get your annual eye exam.    Please bring all your medications to every doctors visit   Sign up for My Chart to have easy access to your labs results, and communication with your Primary care physician.     Please check-out at the front desk before leaving the clinic.     Best,  Dr. Sherene Sires FAMILY MEDICINE RESIDENT - PGY3 11/18/2019 1:42 PM

## 2019-11-19 ENCOUNTER — Encounter: Payer: Self-pay | Admitting: Family Medicine

## 2019-11-19 NOTE — Telephone Encounter (Signed)
Patient called to triage requesting to make this appt. Please call him at [phone].

## 2019-11-26 DIAGNOSIS — H25013 Cortical age-related cataract, bilateral: Secondary | ICD-10-CM | POA: Diagnosis not present

## 2019-11-26 DIAGNOSIS — E119 Type 2 diabetes mellitus without complications: Secondary | ICD-10-CM | POA: Diagnosis not present

## 2019-11-26 DIAGNOSIS — H2513 Age-related nuclear cataract, bilateral: Secondary | ICD-10-CM | POA: Diagnosis not present

## 2019-11-26 DIAGNOSIS — H35033 Hypertensive retinopathy, bilateral: Secondary | ICD-10-CM | POA: Diagnosis not present

## 2019-11-26 LAB — HM DIABETES EYE EXAM

## 2019-12-11 ENCOUNTER — Ambulatory Visit (INDEPENDENT_AMBULATORY_CARE_PROVIDER_SITE_OTHER): Payer: Medicare Other | Admitting: Student in an Organized Health Care Education/Training Program

## 2019-12-11 ENCOUNTER — Encounter: Payer: Self-pay | Admitting: Student in an Organized Health Care Education/Training Program

## 2019-12-11 ENCOUNTER — Other Ambulatory Visit: Payer: Self-pay

## 2019-12-11 DIAGNOSIS — I779 Disorder of arteries and arterioles, unspecified: Secondary | ICD-10-CM

## 2019-12-11 DIAGNOSIS — I998 Other disorder of circulatory system: Secondary | ICD-10-CM | POA: Insufficient documentation

## 2019-12-11 DIAGNOSIS — L03119 Cellulitis of unspecified part of limb: Secondary | ICD-10-CM | POA: Diagnosis not present

## 2019-12-11 MED ORDER — SULFAMETHOXAZOLE-TRIMETHOPRIM 400-80 MG PO TABS: 2.0000 | ORAL_TABLET | Freq: Two times a day (BID) | ORAL | 0 refills | Status: AC

## 2019-12-11 MED ORDER — OXYCODONE HCL 5 MG PO TABS: 5.0000 mg | ORAL_TABLET | ORAL | 0 refills | Status: DC | PRN

## 2019-12-11 NOTE — Assessment & Plan Note (Signed)
Exam consistent with cellulitis without abscess.  Concern for MRSA, treating with Bactrim. Recommended patient take antibiotics for 1 week and return for follow-up.  Urged patient to call in sooner if the pain, redness, swelling do not improve as we may need wider coverage and to consider alternate diagnoses such as gout or venous. Also prescribing short-term oxycodone for pain without Tylenol so can monitor if he fevers.

## 2019-12-11 NOTE — Progress Notes (Signed)
   Subjective:    Patient ID: [name], male    DOB: Apr 12, 1951, 68 y.o.   MRN: [id number]  CC: foot pain  HPI:  Patient has 1 week of left foot pain which he contributes to his diabetes.  Denies any injury to the foot.  Redness, swelling, pain has gradually increased over the past week.  He has tried to treat it with Tylenol and topical rubbing alcohol which was green and discolored the foot.  Denies any spreading redness of his leg or any fevers.  He is still able to walk.  Denies any drainage.  Elevated blood pressure-patient's blood pressure is 166/80 on presentation and recheck 20 minutes later is 162/80.  Patient is adherent with blood pressure medicines.  Denies systemic symptoms.  Smoking status reviewed   ROS: pertinent noted in the HPI   I have personally reviewed pertinent past medical history, surgical, family, and social history as appropriate. Objective:  BP (!) 162/80   Pulse 78   Wt 187 lb 9.6 oz (85.1 kg)   SpO2 97%   BMI 29.38 kg/m   Vitals and nursing note reviewed  General: NAD, pleasant, able to participate in exam Extremities: Left foot has signs of chronic venous stasis and poor vascularization.  Sensation intact grossly bilaterally.  Erythema and tension to the skin on all toes, worse on first metatarsal.  Green discoloration is from a green rubbing alcohol per patient.  (Please see clinical image for further details). Skin: warm and dry, no rashes noted Neuro: alert, no obvious focal deficits Psych: Normal affect and mood        Assessment & Plan:   Cellulitis of foot Exam consistent with cellulitis without abscess.  Concern for MRSA, treating with Bactrim. Recommended patient take antibiotics for 1 week and return for follow-up.  Urged patient to call in sooner if the pain, redness, swelling do not improve as we may need wider coverage and to consider alternate diagnoses such as gout or venous. Also prescribing short-term oxycodone for pain  without Tylenol so can monitor if he fevers.   Meds ordered this encounter  Medications  . sulfamethoxazole-trimethoprim (BACTRIM) 400-80 MG tablet    Sig: Take 2 tablets by mouth 2 (two) times daily for 7 days.    Dispense:  28 tablet    Refill:  0  . oxyCODONE (ROXICODONE) 5 MG immediate release tablet    Sig: Take 1 tablet (5 mg total) by mouth every 4 (four) hours as needed for severe pain.    Dispense:  30 tablet    Refill:  0     Doristine Mango, [place] PGY-2

## 2019-12-11 NOTE — Patient Instructions (Signed)
It was a pleasure to see you today!  To summarize our discussion for this visit:  Please take the antibiotic twice a day and return to see me in 1 week. Call back sooner if the swelling/redness/pain does not get any better.   Some additional health maintenance measures we should update are: Health Maintenance Due  Topic Date Due  . OPHTHALMOLOGY EXAM  09/26/2019  .    Please return to our clinic to see me 1 week.  Call the clinic at [phone] if your symptoms worsen or you have any concerns.   Thank you for allowing me to take part in your care,  Dr. Doristine Mango   Cellulitis, Adult  Cellulitis is a skin infection. The infected area is often warm, red, swollen, and sore. It occurs most often in the arms and lower legs. It is very important to get treated for this condition. What are the causes? This condition is caused by bacteria. The bacteria enter through a break in the skin, such as a cut, burn, insect bite, open sore, or crack. What increases the risk? This condition is more likely to occur in people who:  Have a weak body defense system (immune system).  Have open cuts, burns, bites, or scrapes on the skin.  Are older than 69 years of age.  Have a blood sugar problem (diabetes).  Have a long-lasting (chronic) liver disease (cirrhosis) or kidney disease.  Are very overweight (obese).  Have a skin problem, such as: ? Itchy rash (eczema). ? Slow movement of blood in the veins (venous stasis). ? Fluid buildup below the skin (edema).  Have been treated with high-energy rays (radiation).  Use IV drugs. What are the signs or symptoms? Symptoms of this condition include:  Skin that is: ? Red. ? Streaking. ? Spotting. ? Swollen. ? Sore or painful when you touch it. ? Warm.  A fever.  Chills.  Blisters. How is this diagnosed? This condition is diagnosed based on:  Medical history.  Physical exam.  Blood tests.  Imaging tests. How is this  treated? Treatment for this condition may include:  Medicines to treat infections or allergies.  Home care, such as: ? Rest. ? Placing cold or warm cloths (compresses) on the skin.  Hospital care, if the condition is very bad. Follow these instructions at home: Medicines  Take over-the-counter and prescription medicines only as told by your doctor.  If you were prescribed an antibiotic medicine, take it as told by your doctor. Do not stop taking it even if you start to feel better. General instructions   Drink enough fluid to keep your pee (urine) pale yellow.  Do not touch or rub the infected area.  Raise (elevate) the infected area above the level of your heart while you are sitting or lying down.  Place cold or warm cloths on the area as told by your doctor.  Keep all follow-up visits as told by your doctor. This is important. Contact a doctor if:  You have a fever.  You do not start to get better after 1-2 days of treatment.  Your bone or joint under the infected area starts to hurt after the skin has healed.  Your infection comes back. This can happen in the same area or another area.  You have a swollen bump in the area.  You have new symptoms.  You feel ill and have muscle aches and pains. Get help right away if:  Your symptoms get worse.  You feel  very sleepy.  You throw up (vomit) or have watery poop (diarrhea) for a long time.  You see red streaks coming from the area.  Your red area gets larger.  Your red area turns dark in color. These symptoms may represent a serious problem that is an emergency. Do not wait to see if the symptoms will go away. Get medical help right away. Call your local emergency services (911 in the [place] Do not drive yourself to the hospital. Summary  Cellulitis is a skin infection. The area is often warm, red, swollen, and sore.  This condition is treated with medicines, rest, and cold and warm cloths.  Take all  medicines only as told by your doctor.  Tell your doctor if symptoms do not start to get better after 1-2 days of treatment. This information is not intended to replace advice given to you by your health care provider. Make sure you discuss any questions you have with your health care provider. Document Released: 05/30/2008 Document Revised: 05/03/2018 Document Reviewed: 05/03/2018 Elsevier Patient Education  2020 [employer].

## 2019-12-18 ENCOUNTER — Ambulatory Visit (INDEPENDENT_AMBULATORY_CARE_PROVIDER_SITE_OTHER): Payer: Medicare Other | Admitting: Family Medicine

## 2019-12-18 ENCOUNTER — Encounter: Payer: Self-pay | Admitting: Family Medicine

## 2019-12-18 ENCOUNTER — Other Ambulatory Visit: Payer: Self-pay

## 2019-12-18 VITALS — BP 98/92 | HR 70 | Wt 181.8 lb

## 2019-12-18 DIAGNOSIS — I739 Peripheral vascular disease, unspecified: Secondary | ICD-10-CM

## 2019-12-18 DIAGNOSIS — I998 Other disorder of circulatory system: Secondary | ICD-10-CM | POA: Diagnosis not present

## 2019-12-18 DIAGNOSIS — I779 Disorder of arteries and arterioles, unspecified: Secondary | ICD-10-CM | POA: Diagnosis not present

## 2019-12-18 MED ORDER — OXYCODONE HCL 5 MG PO TABS: 5.0000 mg | ORAL_TABLET | ORAL | 0 refills | Status: AC | PRN

## 2019-12-18 NOTE — Patient Instructions (Signed)
It was great meeting you today!  Given your symptoms and history I'm very concerned about an arterial blood flow issue your left foot.  For this we are going to get an ultrasound and ABI tomorrow at 8 AM.  Is imperative that you make this appointment.  I gave you additional 3-day supply of pain medication to get you through this..  Based on the results I'll give you call and we will discuss the next steps.

## 2019-12-19 ENCOUNTER — Inpatient Hospital Stay (HOSPITAL_COMMUNITY)
Admission: EM | Admit: 2019-12-19 | Discharge: 2019-12-21 | DRG: 253 | Disposition: A | Discharge status: Home Health | Payer: Medicare Other | Attending: Vascular Surgery | Admitting: Vascular Surgery

## 2019-12-19 ENCOUNTER — Other Ambulatory Visit: Payer: Self-pay | Admitting: Student in an Organized Health Care Education/Training Program

## 2019-12-19 ENCOUNTER — Encounter (HOSPITAL_COMMUNITY): Payer: Self-pay | Admitting: Vascular Surgery

## 2019-12-19 ENCOUNTER — Other Ambulatory Visit: Payer: Self-pay

## 2019-12-19 ENCOUNTER — Encounter (HOSPITAL_COMMUNITY): Payer: Self-pay | Admitting: Emergency Medicine

## 2019-12-19 ENCOUNTER — Emergency Department (HOSPITAL_COMMUNITY): Payer: Medicare Other | Admitting: Anesthesiology

## 2019-12-19 ENCOUNTER — Encounter (HOSPITAL_COMMUNITY)
Admission: EM | Disposition: A | Discharge status: Home / Self Care | Payer: Self-pay | Source: Home / Self Care | Attending: Vascular Surgery

## 2019-12-19 ENCOUNTER — Other Ambulatory Visit (HOSPITAL_COMMUNITY): Payer: Self-pay | Admitting: Vascular Surgery

## 2019-12-19 ENCOUNTER — Ambulatory Visit (HOSPITAL_BASED_OUTPATIENT_CLINIC_OR_DEPARTMENT_OTHER)
Admission: RE | Admit: 2019-12-19 | Discharge: 2019-12-19 | Disposition: A | Discharge status: Home / Self Care | Payer: Medicare Other | Source: Ambulatory Visit | Attending: Family Medicine | Admitting: Family Medicine

## 2019-12-19 ENCOUNTER — Encounter: Payer: Self-pay | Admitting: Family Medicine

## 2019-12-19 ENCOUNTER — Ambulatory Visit (HOSPITAL_BASED_OUTPATIENT_CLINIC_OR_DEPARTMENT_OTHER)
Admission: RE | Admit: 2019-12-19 | Discharge: 2019-12-19 | Disposition: A | Discharge status: Home / Self Care | Payer: Medicare Other | Source: Ambulatory Visit | Attending: Vascular Surgery | Admitting: Vascular Surgery

## 2019-12-19 ENCOUNTER — Other Ambulatory Visit (HOSPITAL_COMMUNITY): Payer: Self-pay

## 2019-12-19 DIAGNOSIS — F17290 Nicotine dependence, other tobacco product, uncomplicated: Secondary | ICD-10-CM | POA: Diagnosis present

## 2019-12-19 DIAGNOSIS — E785 Hyperlipidemia, unspecified: Secondary | ICD-10-CM | POA: Diagnosis present

## 2019-12-19 DIAGNOSIS — Z8349 Family history of other endocrine, nutritional and metabolic diseases: Secondary | ICD-10-CM | POA: Diagnosis not present

## 2019-12-19 DIAGNOSIS — T82868A Thrombosis of vascular prosthetic devices, implants and grafts, initial encounter: Secondary | ICD-10-CM | POA: Diagnosis not present

## 2019-12-19 DIAGNOSIS — I743 Embolism and thrombosis of arteries of the lower extremities: Secondary | ICD-10-CM

## 2019-12-19 DIAGNOSIS — Z95820 Peripheral vascular angioplasty status with implants and grafts: Secondary | ICD-10-CM | POA: Diagnosis not present

## 2019-12-19 DIAGNOSIS — M545 Low back pain: Secondary | ICD-10-CM | POA: Diagnosis present

## 2019-12-19 DIAGNOSIS — I739 Peripheral vascular disease, unspecified: Secondary | ICD-10-CM

## 2019-12-19 DIAGNOSIS — E1151 Type 2 diabetes mellitus with diabetic peripheral angiopathy without gangrene: Secondary | ICD-10-CM | POA: Diagnosis present

## 2019-12-19 DIAGNOSIS — K219 Gastro-esophageal reflux disease without esophagitis: Secondary | ICD-10-CM | POA: Diagnosis present

## 2019-12-19 DIAGNOSIS — I10 Essential (primary) hypertension: Secondary | ICD-10-CM | POA: Diagnosis present

## 2019-12-19 DIAGNOSIS — Z888 Allergy status to other drugs, medicaments and biological substances status: Secondary | ICD-10-CM

## 2019-12-19 DIAGNOSIS — I998 Other disorder of circulatory system: Secondary | ICD-10-CM | POA: Diagnosis not present

## 2019-12-19 DIAGNOSIS — Z7984 Long term (current) use of oral hypoglycemic drugs: Secondary | ICD-10-CM

## 2019-12-19 DIAGNOSIS — Z800 Family history of malignant neoplasm of digestive organs: Secondary | ICD-10-CM

## 2019-12-19 DIAGNOSIS — Y832 Surgical operation with anastomosis, bypass or graft as the cause of abnormal reaction of the patient, or of later complication, without mention of misadventure at the time of the procedure: Secondary | ICD-10-CM | POA: Diagnosis present

## 2019-12-19 DIAGNOSIS — Z20828 Contact with and (suspected) exposure to other viral communicable diseases: Secondary | ICD-10-CM | POA: Diagnosis present

## 2019-12-19 DIAGNOSIS — D62 Acute posthemorrhagic anemia: Secondary | ICD-10-CM | POA: Diagnosis not present

## 2019-12-19 DIAGNOSIS — Z833 Family history of diabetes mellitus: Secondary | ICD-10-CM

## 2019-12-19 DIAGNOSIS — F329 Major depressive disorder, single episode, unspecified: Secondary | ICD-10-CM | POA: Diagnosis present

## 2019-12-19 DIAGNOSIS — I70229 Atherosclerosis of native arteries of extremities with rest pain, unspecified extremity: Secondary | ICD-10-CM

## 2019-12-19 DIAGNOSIS — G8929 Other chronic pain: Secondary | ICD-10-CM | POA: Diagnosis present

## 2019-12-19 DIAGNOSIS — Z8249 Family history of ischemic heart disease and other diseases of the circulatory system: Secondary | ICD-10-CM

## 2019-12-19 DIAGNOSIS — E7800 Pure hypercholesterolemia, unspecified: Secondary | ICD-10-CM

## 2019-12-19 HISTORY — PX: PATCH ANGIOPLASTY: SHX6230

## 2019-12-19 HISTORY — DX: Atherosclerosis of native arteries of extremities with rest pain, unspecified extremity: I70.229

## 2019-12-19 HISTORY — PX: EMBOLECTOMY: SHX44

## 2019-12-19 LAB — GLUCOSE, CAPILLARY
Glucose-Capillary: 113 mg/dL — ABNORMAL HIGH (ref 70–99)
Glucose-Capillary: 121 mg/dL — ABNORMAL HIGH (ref 70–99)
Glucose-Capillary: 143 mg/dL — ABNORMAL HIGH (ref 70–99)
Glucose-Capillary: 136 mg/dL — ABNORMAL HIGH (ref 70–99)

## 2019-12-19 LAB — CBC WITH DIFFERENTIAL/PLATELET
Abs Immature Granulocytes: 0.01 10*3/uL (ref 0.00–0.07)
Basophils Absolute: 0 10*3/uL (ref 0.0–0.1)
Basophils Relative: 0 %
Eosinophils Absolute: 0.1 10*3/uL (ref 0.0–0.5)
Eosinophils Relative: 1 %
HCT: 35.9 % — ABNORMAL LOW (ref 39.0–52.0)
Hemoglobin: 10.1 g/dL — ABNORMAL LOW (ref 13.0–17.0)
Immature Granulocytes: 0 %
Lymphocytes Relative: 37 %
Lymphs Abs: 1.8 10*3/uL (ref 0.7–4.0)
MCH: 17.3 pg — ABNORMAL LOW (ref 26.0–34.0)
MCHC: 28.1 g/dL — ABNORMAL LOW (ref 30.0–36.0)
MCV: 61.4 fL — ABNORMAL LOW (ref 80.0–100.0)
Monocytes Absolute: 0.7 10*3/uL (ref 0.1–1.0)
Monocytes Relative: 15 %
Neutro Abs: 2.3 10*3/uL (ref 1.7–7.7)
Neutrophils Relative %: 47 %
Platelets: 273 10*3/uL (ref 150–400)
RBC: 5.85 MIL/uL — ABNORMAL HIGH (ref 4.22–5.81)
RDW: 24.1 % — ABNORMAL HIGH (ref 11.5–15.5)
WBC: 4.9 10*3/uL (ref 4.0–10.5)
nRBC: 0 % (ref 0.0–0.2)

## 2019-12-19 LAB — BASIC METABOLIC PANEL
Anion gap: 12 (ref 5–15)
BUN: 12 mg/dL (ref 8–23)
CO2: 25 mmol/L (ref 22–32)
Calcium: 9.5 mg/dL (ref 8.9–10.3)
Chloride: 95 mmol/L — ABNORMAL LOW (ref 98–111)
Creatinine, Ser: 1.05 mg/dL (ref 0.61–1.24)
GFR calc Af Amer: >60 mL/min (ref >60)
GFR calc non Af Amer: >60 mL/min (ref >60)
Glucose, Bld: 113 mg/dL — ABNORMAL HIGH (ref 70–99)
Potassium: 5.1 mmol/L (ref 3.5–5.1)
Sodium: 132 mmol/L — ABNORMAL LOW (ref 135–145)

## 2019-12-19 LAB — CBC
HCT: 25.3 % — ABNORMAL LOW (ref 39.0–52.0)
Hemoglobin: 7.3 g/dL — ABNORMAL LOW (ref 13.0–17.0)
MCH: 17.4 pg — ABNORMAL LOW (ref 26.0–34.0)
MCHC: 28.9 g/dL — ABNORMAL LOW (ref 30.0–36.0)
MCV: 60.2 fL — ABNORMAL LOW (ref 80.0–100.0)
Platelets: 165 10*3/uL (ref 150–400)
RBC: 4.2 MIL/uL — ABNORMAL LOW (ref 4.22–5.81)
RDW: 22.8 % — ABNORMAL HIGH (ref 11.5–15.5)
WBC: 4.4 10*3/uL (ref 4.0–10.5)
nRBC: 0 % (ref 0.0–0.2)

## 2019-12-19 LAB — CREATININE, SERUM
Creatinine, Ser: 0.97 mg/dL (ref 0.61–1.24)
GFR calc Af Amer: >60 mL/min (ref >60)
GFR calc non Af Amer: >60 mL/min (ref >60)

## 2019-12-19 LAB — PROTIME-INR
INR: 1.1 (ref 0.8–1.2)
Prothrombin Time: 14 seconds (ref 11.4–15.2)

## 2019-12-19 LAB — POC SARS CORONAVIRUS 2 AG -  ED: SARS Coronavirus 2 Ag: NEGATIVE

## 2019-12-19 LAB — APTT: aPTT: 29 seconds (ref 24–36)

## 2019-12-19 SURGERY — EMBOLECTOMY
Anesthesia: General | Site: Leg Lower | Laterality: Left

## 2019-12-19 MED ORDER — SODIUM CHLORIDE 0.9 % IV SOLN: INTRAVENOUS | Status: DC | PRN

## 2019-12-19 MED ORDER — FENTANYL CITRATE (PF) 100 MCG/2ML IJ SOLN
INTRAMUSCULAR | Status: DC | PRN
  Administered 2019-12-19: 25 ug via INTRAVENOUS
  Administered 2019-12-19 (×2): 50 ug via INTRAVENOUS
  Administered 2019-12-19: 25 ug via INTRAVENOUS

## 2019-12-19 MED ORDER — SODIUM CHLORIDE 0.9 % IV SOLN: INTRAVENOUS | Status: DC

## 2019-12-19 MED ORDER — PROPOFOL 10 MG/ML IV BOLUS
INTRAVENOUS | Status: AC
  Filled 2019-12-19: qty 20

## 2019-12-19 MED ORDER — GABAPENTIN 100 MG PO CAPS
100.0000 mg | ORAL_CAPSULE | Freq: Every day | ORAL | Status: DC
  Administered 2019-12-19 – 2019-12-20 (×2): 100 mg via ORAL
  Filled 2019-12-19 (×2): qty 1

## 2019-12-19 MED ORDER — ONDANSETRON HCL 4 MG/2ML IJ SOLN: 4.0000 mg | Freq: Once | INTRAMUSCULAR | Status: DC | PRN

## 2019-12-19 MED ORDER — METFORMIN HCL 500 MG PO TABS
1000.0000 mg | ORAL_TABLET | Freq: Two times a day (BID) | ORAL | Status: DC
  Administered 2019-12-19 – 2019-12-21 (×4): 1000 mg via ORAL
  Filled 2019-12-19 (×4): qty 2

## 2019-12-19 MED ORDER — INSULIN ASPART 100 UNIT/ML ~~LOC~~ SOLN
0.0000 [IU] | Freq: Three times a day (TID) | SUBCUTANEOUS | Status: DC
  Administered 2019-12-19 – 2019-12-20 (×2): 2 [IU] via SUBCUTANEOUS

## 2019-12-19 MED ORDER — LIDOCAINE 2% (20 MG/ML) 5 ML SYRINGE
INTRAMUSCULAR | Status: AC
  Filled 2019-12-19: qty 5

## 2019-12-19 MED ORDER — METOPROLOL TARTRATE 5 MG/5ML IV SOLN: 2.0000 mg | INTRAVENOUS | Status: DC | PRN

## 2019-12-19 MED ORDER — ONDANSETRON HCL 4 MG/2ML IJ SOLN
INTRAMUSCULAR | Status: DC | PRN
  Administered 2019-12-19: 4 mg via INTRAVENOUS

## 2019-12-19 MED ORDER — GUAIFENESIN-DM 100-10 MG/5ML PO SYRP: 15.0000 mL | ORAL_SOLUTION | ORAL | Status: DC | PRN

## 2019-12-19 MED ORDER — HEPARIN BOLUS VIA INFUSION
4200.0000 [IU] | Freq: Once | INTRAVENOUS | Status: AC
  Administered 2019-12-19: 4200 [IU] via INTRAVENOUS
  Filled 2019-12-19: qty 4200

## 2019-12-19 MED ORDER — ALBUMIN HUMAN 5 % IV SOLN: INTRAVENOUS | Status: DC | PRN

## 2019-12-19 MED ORDER — SODIUM CHLORIDE 0.9 % IV SOLN
INTRAVENOUS | Status: DC | PRN
  Administered 2019-12-19: 500 mL

## 2019-12-19 MED ORDER — PHENOL 1.4 % MT LIQD: 1.0000 | OROMUCOSAL | Status: DC | PRN

## 2019-12-19 MED ORDER — CEFAZOLIN SODIUM-DEXTROSE 2-4 GM/100ML-% IV SOLN
2.0000 g | Freq: Once | INTRAVENOUS | Status: AC
  Administered 2019-12-19: 2 g via INTRAVENOUS

## 2019-12-19 MED ORDER — DOCUSATE SODIUM 100 MG PO CAPS
100.0000 mg | ORAL_CAPSULE | Freq: Every day | ORAL | Status: DC
  Administered 2019-12-20 – 2019-12-21 (×2): 100 mg via ORAL
  Filled 2019-12-19 (×2): qty 1

## 2019-12-19 MED ORDER — FENTANYL CITRATE (PF) 100 MCG/2ML IJ SOLN
INTRAMUSCULAR | Status: AC
  Filled 2019-12-19: qty 2

## 2019-12-19 MED ORDER — MEPERIDINE HCL 25 MG/ML IJ SOLN: 6.2500 mg | INTRAMUSCULAR | Status: DC | PRN

## 2019-12-19 MED ORDER — CLOPIDOGREL BISULFATE 75 MG PO TABS
75.0000 mg | ORAL_TABLET | Freq: Every day | ORAL | Status: DC
  Administered 2019-12-20 – 2019-12-21 (×2): 75 mg via ORAL
  Filled 2019-12-19 (×2): qty 1

## 2019-12-19 MED ORDER — HYDROMORPHONE HCL 1 MG/ML IJ SOLN
0.2500 mg | INTRAMUSCULAR | Status: DC | PRN
  Administered 2019-12-19 (×2): 0.5 mg via INTRAVENOUS

## 2019-12-19 MED ORDER — HEMOSTATIC AGENTS (NO CHARGE) OPTIME
TOPICAL | Status: DC | PRN
  Administered 2019-12-19 (×2): 1 via TOPICAL

## 2019-12-19 MED ORDER — LACTATED RINGERS IV SOLN: INTRAVENOUS | Status: DC | PRN

## 2019-12-19 MED ORDER — MORPHINE SULFATE (PF) 2 MG/ML IV SOLN
2.0000 mg | INTRAVENOUS | Status: DC | PRN
  Administered 2019-12-19 – 2019-12-20 (×3): 2 mg via INTRAVENOUS
  Filled 2019-12-19 (×3): qty 1

## 2019-12-19 MED ORDER — BENAZEPRIL HCL 5 MG PO TABS
5.0000 mg | ORAL_TABLET | Freq: Every day | ORAL | Status: DC
  Administered 2019-12-19 – 2019-12-21 (×3): 5 mg via ORAL
  Filled 2019-12-19 (×3): qty 1

## 2019-12-19 MED ORDER — ALUM & MAG HYDROXIDE-SIMETH 200-200-20 MG/5ML PO SUSP: 15.0000 mL | ORAL | Status: DC | PRN

## 2019-12-19 MED ORDER — DEXAMETHASONE SODIUM PHOSPHATE 10 MG/ML IJ SOLN
INTRAMUSCULAR | Status: AC
  Filled 2019-12-19: qty 1

## 2019-12-19 MED ORDER — PANTOPRAZOLE SODIUM 40 MG PO TBEC
40.0000 mg | DELAYED_RELEASE_TABLET | Freq: Every day | ORAL | Status: DC
  Administered 2019-12-19 – 2019-12-21 (×3): 40 mg via ORAL
  Filled 2019-12-19 (×3): qty 1

## 2019-12-19 MED ORDER — LACTATED RINGERS IV SOLN: INTRAVENOUS | Status: DC

## 2019-12-19 MED ORDER — OXYCODONE HCL 5 MG PO TABS
5.0000 mg | ORAL_TABLET | ORAL | Status: DC | PRN
  Administered 2019-12-19 – 2019-12-21 (×8): 10 mg via ORAL
  Filled 2019-12-19 (×8): qty 2

## 2019-12-19 MED ORDER — BISACODYL 10 MG RE SUPP: 10.0000 mg | Freq: Every day | RECTAL | Status: DC | PRN

## 2019-12-19 MED ORDER — SODIUM CHLORIDE 0.9 % IV SOLN
INTRAVENOUS | Status: AC
  Filled 2019-12-19: qty 1.2

## 2019-12-19 MED ORDER — CEFAZOLIN SODIUM-DEXTROSE 2-4 GM/100ML-% IV SOLN
INTRAVENOUS | Status: AC
  Filled 2019-12-19: qty 100

## 2019-12-19 MED ORDER — MAGNESIUM SULFATE 2 GM/50ML IV SOLN: 2.0000 g | Freq: Every day | INTRAVENOUS | Status: DC | PRN

## 2019-12-19 MED ORDER — FENTANYL CITRATE (PF) 100 MCG/2ML IJ SOLN
50.0000 ug | Freq: Once | INTRAMUSCULAR | Status: AC
  Administered 2019-12-19: 50 ug via INTRAVENOUS

## 2019-12-19 MED ORDER — HYDRALAZINE HCL 20 MG/ML IJ SOLN: 5.0000 mg | INTRAMUSCULAR | Status: DC | PRN

## 2019-12-19 MED ORDER — GLYCOPYRROLATE PF 0.2 MG/ML IJ SOSY
PREFILLED_SYRINGE | INTRAMUSCULAR | Status: DC | PRN
  Administered 2019-12-19 (×2): .1 mg via INTRAVENOUS

## 2019-12-19 MED ORDER — ATORVASTATIN CALCIUM 40 MG PO TABS
40.0000 mg | ORAL_TABLET | Freq: Every day | ORAL | Status: DC
  Administered 2019-12-20 – 2019-12-21 (×2): 40 mg via ORAL
  Filled 2019-12-19 (×2): qty 1

## 2019-12-19 MED ORDER — HYDROMORPHONE HCL 1 MG/ML IJ SOLN
INTRAMUSCULAR | Status: AC
  Filled 2019-12-19: qty 1

## 2019-12-19 MED ORDER — POTASSIUM CHLORIDE CRYS ER 20 MEQ PO TBCR: 20.0000 meq | EXTENDED_RELEASE_TABLET | Freq: Every day | ORAL | Status: DC | PRN

## 2019-12-19 MED ORDER — 0.9 % SODIUM CHLORIDE (POUR BTL) OPTIME
TOPICAL | Status: DC | PRN
  Administered 2019-12-19: 500 mL

## 2019-12-19 MED ORDER — PROTAMINE SULFATE 10 MG/ML IV SOLN
INTRAVENOUS | Status: DC | PRN
  Administered 2019-12-19: 50 mg via INTRAVENOUS

## 2019-12-19 MED ORDER — MIDAZOLAM HCL 2 MG/2ML IJ SOLN
INTRAMUSCULAR | Status: AC
  Filled 2019-12-19: qty 2

## 2019-12-19 MED ORDER — LIDOCAINE 2% (20 MG/ML) 5 ML SYRINGE
INTRAMUSCULAR | Status: DC | PRN
  Administered 2019-12-19: 100 mg via INTRAVENOUS

## 2019-12-19 MED ORDER — 0.9 % SODIUM CHLORIDE (POUR BTL) OPTIME
TOPICAL | Status: DC | PRN
  Administered 2019-12-19: 2000 mL

## 2019-12-19 MED ORDER — SODIUM CHLORIDE 0.9 % IV SOLN: 500.0000 mL | Freq: Once | INTRAVENOUS | Status: DC | PRN

## 2019-12-19 MED ORDER — MIDAZOLAM HCL 5 MG/5ML IJ SOLN
INTRAMUSCULAR | Status: DC | PRN
  Administered 2019-12-19: 1 mg via INTRAVENOUS

## 2019-12-19 MED ORDER — ONDANSETRON HCL 4 MG/2ML IJ SOLN
INTRAMUSCULAR | Status: AC
  Filled 2019-12-19: qty 2

## 2019-12-19 MED ORDER — PHENYLEPHRINE 40 MCG/ML (10ML) SYRINGE FOR IV PUSH (FOR BLOOD PRESSURE SUPPORT)
PREFILLED_SYRINGE | INTRAVENOUS | Status: DC | PRN
  Administered 2019-12-19 (×2): 80 ug via INTRAVENOUS
  Administered 2019-12-19: 120 ug via INTRAVENOUS
  Administered 2019-12-19: 40 ug via INTRAVENOUS
  Administered 2019-12-19: 80 ug via INTRAVENOUS

## 2019-12-19 MED ORDER — PROPOFOL 10 MG/ML IV BOLUS
INTRAVENOUS | Status: DC | PRN
  Administered 2019-12-19: 150 mg via INTRAVENOUS
  Administered 2019-12-19: 50 mg via INTRAVENOUS

## 2019-12-19 MED ORDER — ACETAMINOPHEN 325 MG RE SUPP: 325.0000 mg | RECTAL | Status: DC | PRN

## 2019-12-19 MED ORDER — NEBIVOLOL HCL 10 MG PO TABS
20.0000 mg | ORAL_TABLET | Freq: Every day | ORAL | Status: DC
  Administered 2019-12-20 – 2019-12-21 (×2): 20 mg via ORAL
  Filled 2019-12-19 (×2): qty 2

## 2019-12-19 MED ORDER — LABETALOL HCL 5 MG/ML IV SOLN: 10.0000 mg | INTRAVENOUS | Status: DC | PRN

## 2019-12-19 MED ORDER — GLYCOPYRROLATE PF 0.2 MG/ML IJ SOSY
PREFILLED_SYRINGE | INTRAMUSCULAR | Status: AC
  Filled 2019-12-19: qty 1

## 2019-12-19 MED ORDER — HEPARIN SODIUM (PORCINE) 5000 UNIT/ML IJ SOLN
5000.0000 [IU] | Freq: Three times a day (TID) | INTRAMUSCULAR | Status: DC
  Administered 2019-12-20 – 2019-12-21 (×3): 5000 [IU] via SUBCUTANEOUS
  Filled 2019-12-19 (×3): qty 1

## 2019-12-19 MED ORDER — POLYETHYLENE GLYCOL 3350 17 G PO PACK: 17.0000 g | PACK | Freq: Every day | ORAL | Status: DC | PRN

## 2019-12-19 MED ORDER — CEFAZOLIN SODIUM-DEXTROSE 2-4 GM/100ML-% IV SOLN
2.0000 g | Freq: Three times a day (TID) | INTRAVENOUS | Status: AC
  Administered 2019-12-19 – 2019-12-20 (×2): 2 g via INTRAVENOUS
  Filled 2019-12-19 (×2): qty 100

## 2019-12-19 MED ORDER — PHENYLEPHRINE 40 MCG/ML (10ML) SYRINGE FOR IV PUSH (FOR BLOOD PRESSURE SUPPORT)
PREFILLED_SYRINGE | INTRAVENOUS | Status: AC
  Filled 2019-12-19: qty 10

## 2019-12-19 MED ORDER — DEXAMETHASONE SODIUM PHOSPHATE 4 MG/ML IJ SOLN
INTRAMUSCULAR | Status: DC | PRN
  Administered 2019-12-19: 4 mg via INTRAVENOUS

## 2019-12-19 MED ORDER — FLUOXETINE HCL 10 MG PO CAPS
10.0000 mg | ORAL_CAPSULE | Freq: Every day | ORAL | Status: DC
  Administered 2019-12-19 – 2019-12-21 (×3): 10 mg via ORAL
  Filled 2019-12-19 (×3): qty 1

## 2019-12-19 MED ORDER — PHENYLEPHRINE HCL-NACL 10-0.9 MG/250ML-% IV SOLN
INTRAVENOUS | Status: DC | PRN
  Administered 2019-12-19: 50 ug/min via INTRAVENOUS

## 2019-12-19 MED ORDER — FENTANYL CITRATE (PF) 250 MCG/5ML IJ SOLN
INTRAMUSCULAR | Status: AC
  Filled 2019-12-19: qty 5

## 2019-12-19 MED ORDER — HEPARIN SODIUM (PORCINE) 1000 UNIT/ML IJ SOLN
INTRAMUSCULAR | Status: DC | PRN
  Administered 2019-12-19: 2000 [IU] via INTRAVENOUS
  Administered 2019-12-19: 6000 [IU] via INTRAVENOUS

## 2019-12-19 MED ORDER — ACETAMINOPHEN 325 MG PO TABS
325.0000 mg | ORAL_TABLET | ORAL | Status: DC | PRN
  Administered 2019-12-20 – 2019-12-21 (×2): 650 mg via ORAL
  Filled 2019-12-19 (×2): qty 2

## 2019-12-19 MED ORDER — ONDANSETRON HCL 4 MG/2ML IJ SOLN: 4.0000 mg | Freq: Four times a day (QID) | INTRAMUSCULAR | Status: DC | PRN

## 2019-12-19 MED ORDER — HEPARIN (PORCINE) 25000 UT/250ML-% IV SOLN
1350.0000 [IU]/h | INTRAVENOUS | Status: DC
  Administered 2019-12-19: 1350 [IU]/h via INTRAVENOUS
  Filled 2019-12-19: qty 250

## 2019-12-19 SURGICAL SUPPLY — 60 items
ADH SKN CLS APL DERMABOND .7 (GAUZE/BANDAGES/DRESSINGS) ×1
AGENT HMST SPONGE THK3/8 (HEMOSTASIS)
BANDAGE ESMARK 6X9 LF (GAUZE/BANDAGES/DRESSINGS) IMPLANT
BNDG CMPR 9X6 STRL LF SNTH (GAUZE/BANDAGES/DRESSINGS) ×1
BNDG ESMARK 6X9 LF (GAUZE/BANDAGES/DRESSINGS) ×2
CANISTER SUCT 3000ML PPV (MISCELLANEOUS) ×2 IMPLANT
CATH EMB 2FR 60CM (CATHETERS) ×1 IMPLANT
CATH EMB 3FR 80CM (CATHETERS) ×1 IMPLANT
CATH EMB 4FR 80CM (CATHETERS) ×2 IMPLANT
CATH EMB 5FR 80CM (CATHETERS) IMPLANT
CLIP VESOCCLUDE MED 24/CT (CLIP) ×2 IMPLANT
CLIP VESOCCLUDE SM WIDE 24/CT (CLIP) ×2 IMPLANT
COVER WAND RF STERILE (DRAPES) ×2 IMPLANT
CUFF TOURN SGL QUICK 18X4 (TOURNIQUET CUFF) IMPLANT
CUFF TOURN SGL QUICK 24 (TOURNIQUET CUFF) ×2
CUFF TOURN SGL QUICK 34 (TOURNIQUET CUFF)
CUFF TOURN SGL QUICK 42 (TOURNIQUET CUFF) IMPLANT
CUFF TRNQT CYL 24X4X16.5-23 (TOURNIQUET CUFF) IMPLANT
CUFF TRNQT CYL 34X4.125X (TOURNIQUET CUFF) IMPLANT
DERMABOND ADVANCED (GAUZE/BANDAGES/DRESSINGS) ×1
DERMABOND ADVANCED .7 DNX12 (GAUZE/BANDAGES/DRESSINGS) IMPLANT
DRAIN CHANNEL 15F RND FF W/TCR (WOUND CARE) IMPLANT
DRAPE X-RAY CASS 24X20 (DRAPES) IMPLANT
ELECT REM PT RETURN 9FT ADLT (ELECTROSURGICAL) ×2
ELECTRODE REM PT RTRN 9FT ADLT (ELECTROSURGICAL) ×1 IMPLANT
EVACUATOR SILICONE 100CC (DRAIN) IMPLANT
GAUZE SPONGE 4X4 16PLY XRAY LF (GAUZE/BANDAGES/DRESSINGS) ×1 IMPLANT
GLOVE BIO SURGEON STRL SZ7.5 (GLOVE) ×2 IMPLANT
GLOVE BIOGEL PI IND STRL 8 (GLOVE) ×1 IMPLANT
GLOVE BIOGEL PI INDICATOR 8 (GLOVE) ×1
GOWN STRL REUS W/ TWL LRG LVL3 (GOWN DISPOSABLE) ×2 IMPLANT
GOWN STRL REUS W/ TWL XL LVL3 (GOWN DISPOSABLE) ×2 IMPLANT
GOWN STRL REUS W/TWL LRG LVL3 (GOWN DISPOSABLE) ×8
GOWN STRL REUS W/TWL XL LVL3 (GOWN DISPOSABLE) ×2
HEMOSTAT SNOW SURGICEL 2X4 (HEMOSTASIS) ×2 IMPLANT
HEMOSTAT SPONGE AVITENE ULTRA (HEMOSTASIS) IMPLANT
KIT BASIN OR (CUSTOM PROCEDURE TRAY) ×2 IMPLANT
KIT TURNOVER KIT B (KITS) ×2 IMPLANT
NS IRRIG 1000ML POUR BTL (IV SOLUTION) ×5 IMPLANT
PACK PERIPHERAL VASCULAR (CUSTOM PROCEDURE TRAY) ×2 IMPLANT
PAD ARMBOARD 7.5X6 YLW CONV (MISCELLANEOUS) ×4 IMPLANT
PATCH VASC XENOSURE 1CMX6CM (Vascular Products) ×2 IMPLANT
PATCH VASC XENOSURE 1X6 (Vascular Products) IMPLANT
SET COLLECT BLD 21X3/4 12 (NEEDLE) IMPLANT
STOPCOCK 4 WAY LG BORE MALE ST (IV SETS) IMPLANT
SUT MNCRL AB 4-0 PS2 18 (SUTURE) ×2 IMPLANT
SUT PROLENE 5 0 C 1 24 (SUTURE) ×6 IMPLANT
SUT PROLENE 6 0 BV (SUTURE) ×14 IMPLANT
SUT SILK 2 0 PERMA HAND 18 BK (SUTURE) IMPLANT
SUT VIC AB 2-0 CT1 27 (SUTURE) ×2
SUT VIC AB 2-0 CT1 TAPERPNT 27 (SUTURE) ×1 IMPLANT
SUT VIC AB 3-0 SH 27 (SUTURE) ×2
SUT VIC AB 3-0 SH 27X BRD (SUTURE) ×1 IMPLANT
SYR 3ML LL SCALE MARK (SYRINGE) ×2 IMPLANT
SYR TB 1ML LUER SLIP (SYRINGE) ×2 IMPLANT
TOWEL GREEN STERILE (TOWEL DISPOSABLE) ×2 IMPLANT
TRAY FOLEY MTR SLVR 16FR STAT (SET/KITS/TRAYS/PACK) ×2 IMPLANT
TUBING EXTENTION W/L.L. (IV SETS) IMPLANT
UNDERPAD 30X30 (UNDERPADS AND DIAPERS) ×2 IMPLANT
WATER STERILE IRR 1000ML POUR (IV SOLUTION) ×2 IMPLANT

## 2019-12-19 NOTE — Progress Notes (Addendum)
HPI 68 year old male who presents for left foot pain.  States that the pain is a very sharp pain present at all of the phalangeal tips of his left foot, worse in the great toe and fifth digit.  He states that initially started out as a numbness and tingling around a month ago.  Pain acutely worsened approximately 2 weeks ago and has been waking him up a lot at night and has become somewhat unbearable during the daytime.  He was evaluated by his PCP on 12/11/2019.  At that time he had erythema and swelling mainly located in his great toe and was diagnosed with cellulitis.  He was placed on a 1 week course of Bactrim which did not improve his symptoms.  He was given a 1 week supply of oxycodone which has been helping some.  He presents in clinic today for continued evaluation given the lack of improvement of his symptoms.  Since that clinic evaluation the patient's distal phalanges have become more dusky in appearance and the pain has become more severe.  Patient states that they have become painful to touch and have limited his ability to sleep at night.  Of note the patient is a known vasculopath with a 30+ year smoking history and type 2 diabetes.  He has a left common femoral to popliteal bypass left side which was placed in 2018. It appears that the patient Vascular surgeon ordered ABIs w/ reflex BLE arterial ultrasound back in mid November but the patient did not get this done for unknown reasons.  CC: Left distal phalanges pain   ROS:   Review of Systems See HPI for ROS.   CC, SH/smoking status, and VS noted  Objective: BP (!) 98/92   Pulse 70   Wt 181 lb 12.8 oz (82.5 kg)   SpO2 97%   BMI 28.47 kg/m  Gen: [name], no acute distressL CV: Regular rate rhythm, no M/R/G Resp: Lungs clear to auscultation bilaterally, no accessory muscle use Neuro: Alert and oriented, Speech clear, No gross deficits Left foot: Exquisite tenderness to palpation tip of  great toe and fifth digit.  Previous erythema has been replaced by faint dusky appearance at all phalangeal tips of left foot.  Very prominent digital clubbing noted.  Distal foot not cold to touch, without palpable pulses in AT or DP distribution.  Very notable onychomycosis with greenish nail discoloration noted.      Assessment and plan:  PAD (peripheral artery disease) ([name]) Patient with obvious and apparent PAD on exam.  All the classic symptoms of progressive ischemia of his distal foot.  Numbness and tingling progressing to out right pain, distribution of the anterior tibial artery, erythematous to dusky appearance transition all underlying worsening of ischemia in the left foot.  Discussed going to the emergency department versus any patient in for a stat ABI with reflex left lower extremity arterial Doppler.  Able to get patient in for the study at 8 AM on 12/24.  Gave my personal number to discuss results versus sending patient to emergency department if results concerning enough.  Ischemia of foot Patient initially treated for cellulitis with Bactrim for a week with no results.  Patient has developed clear ischemia of his left foot.  Would be concerned about the patency of his left common femoral to popliteal bypass.  Discussed with patient regarding send him straight to the emergency department versus getting a stat ABI with arterial Doppler, patient opted to have study done  as soon as possible which was scheduled for 8 AM on 12/24.  I will give him a 3-day supply of oxycodone to help with the acute pain.  I would anticipate vascular intervention in the very near future given the progression of his symptoms over the last week.   No orders of the defined types were placed in this encounter.   Meds ordered this encounter  Medications  . oxyCODONE (ROXICODONE) 5 MG immediate release tablet    Sig: Take 1 tablet (5 mg total) by mouth every 4 (four) hours as needed for up to 3 days for  severe pain.    Dispense:  18 tablet    Refill:  0    Note addendum Noted that patient's left ABI with no Doppler signals and absent flow.  Also note moderate right lower extremity arterial disease.  On reflex arterial Doppler, patient with near complete occlusion of his known graft.  I do note the patient has an ED encounter started.  Given findings would anticipate vascular surgery involvement and possible intervention in the very near future.   Guadalupe Dawn MD PGY-3 Family Medicine Resident  12/19/2019 9:27 AM

## 2019-12-19 NOTE — Consult Note (Addendum)
Hospital Consult    Reason for Consult:  Occluded left fem peroneal bypass Referring Physician:  ED MRN #:  [id number]  History of Present Illness: This is a 68 y.o. male with history of hypertension, hyperlipidemia, diabetes, peripheral vascular disease that presents for evaluation of left foot pain for the last 3 to 4 weeks.  Patient is well-known to the vascular surgery service.  Previous underwent a left common femoral to below-knee popliteal bypass with saphenous vein by Dr. Kellie Simmering in 2017.  This subsequently occluded later in 2017.  He then underwent a left common femoral to peroneal artery bypass with 6 mm PTFE with Dr. Scot Dock on 11/11/2016.  He was last seen by Dr. Scot Dock on 05/15/2019 and the left fem peroneal bypass was patent with no flow-limiting stenosis. Patient states he has had severe 8 out of 10 pain in the left foot for at least 3 to 4 weeks.  He was sent for vascular studies as an outpatient today.  It was noted that he had no flow at the ankle.  I was contacted by the vascular lab and instructed to send the patient to the ED for vascular evaluation.  Further imaging showed an occluded left fem peroneal bypass with an occluded peroneal distally.  His anterior tibial and posterior tibial have been chronically occluded.  Numbness in the foot, some weakness in the toes.  Past Medical History:  Diagnosis Date  . Anemia   . Arthritis    OA  . Cervical radiculopathy    Dr. Vertell Limber neurosurgery  . Chronic lower back pain   . Diabetes mellitus    takes Metformin daily  . GERD (gastroesophageal reflux disease)    takes Protonix daily  . History of blood transfusion    "related to low HgB" ((09/10/2015  . Hyperlipidemia    takes Vytorin daily  . Hypertension    takes Benazepril and Bystolic daily  . PAD (peripheral artery disease) ([place])   . Pneumonia   . Shortness of breath dyspnea    with exertion  . Tobacco user   . Toe fracture, right 05/09/2011    Past Surgical  History:  Procedure Laterality Date  . ABDOMINAL AORTOGRAM W/LOWER EXTREMITY N/A 12/11/2017   Procedure: ABDOMINAL AORTOGRAM W/LOWER EXTREMITY;  Surgeon: Angelia Mould, MD;  Location: [facility];  Service: Cardiovascular;  Laterality: N/A;  . ANTERIOR CERVICAL DECOMP/DISCECTOMY FUSION  03/08/12   C6-7  . ANTERIOR CERVICAL DECOMP/DISCECTOMY FUSION  03/08/2012   Procedure: ANTERIOR CERVICAL DECOMPRESSION/DISCECTOMY FUSION 1 LEVEL/HARDWARE REMOVAL;  Surgeon: Erline Levine, MD;  Location: [facility];  Service: Neurosurgery;  Laterality: N/A;  revison of C5-7 anterior cervical decompression with fusion with Cervical Five-Thoracic One anterior cervical decompression with fusion with interbody prothesis plating and bonegraft  . BACK SURGERY  1996  . BYPASS GRAFT FEMORAL-PERONEAL Left 11/11/2016   Procedure: REDO LEFT FEMORAL-PERONEAL BYPASS WITH PROPATEN 6MM X 80CM GRAFT;  Surgeon: Angelia Mould, MD;  Location: [facility];  Service: Vascular;  Laterality: Left;  . COLONOSCOPY W/ BIOPSIES AND POLYPECTOMY  08/17/2012   f/u 5 years, 4 polyps, no high grade dysplasia or malignancy, tubular adenoma, hyperplastic polyops  . ENTEROSCOPY N/A 12/11/2015   Procedure: ENTEROSCOPY;  Surgeon: Carol Ada, MD;  Location: [facility] ENDOS[facility];  Service: Endoscopy;  Laterality: N/A;  . ENTEROSCOPY N/A 03/29/2018   Procedure: ENTEROSCOPY;  Surgeon: Carol Ada, MD;  Location: [facility] ENDOSCOPY;  Service: Endoscopy;  Laterality: N/A;  . ESOPHAGOGASTRODUODENOSCOPY  08/17/2012   normal esophagus and  GEJ, diffuse gastritis with erythema- no malignancy, reactive gastropathy  with focal intestinal metaplasia  . FEMORAL-POPLITEAL BYPASS GRAFT Left 01/06/2016   Procedure: Left  COMMON FEMORAL-BELOW KNEE POPLITEAL ARTERY Bypass using non-reversed translocated saphenous vein graft from left leg;  Surgeon: Mal Misty, MD;  Location: [place];  Service: Vascular;  Laterality: Left;  . GIVENS CAPSULE STUDY N/A 11/24/2015    Procedure: GIVENS CAPSULE STUDY;  Surgeon: Juanita Craver, MD;  Location: [place];  Service: Endoscopy;  Laterality: N/A;  . HOT HEMOSTASIS N/A 03/29/2018   Procedure: HOT HEMOSTASIS (ARGON PLASMA COAGULATION/BICAP);  Surgeon: Carol Ada, MD;  Location: [place];  Service: Endoscopy;  Laterality: N/A;  . INGUINAL HERNIA REPAIR  1990's   right  . INTRAOPERATIVE ARTERIOGRAM Left 01/06/2016   Procedure: INTRA OPERATIVE ARTERIOGRAM LEFT LOWER LEG;  Surgeon: Mal Misty, MD;  Location: [place];  Service: Vascular;  Laterality: Left;  . INTRAOPERATIVE ARTERIOGRAM Left 11/11/2016   Procedure: INTRA OPERATIVE ARTERIOGRAM LEFT LOWER EXTRIMITY;  Surgeon: Angelia Mould, MD;  Location: [place];  Service: Vascular;  Laterality: Left;  . IR ANGIOGRAM FOLLOW UP STUDY  12/11/2017  . IR GENERIC HISTORICAL  10/24/2016   IR ANGIOGRAM FOLLOW UP STUDY  . LOWER EXTREMITY ANGIOGRAM Bilateral 07/30/2015   Procedure: Lower Extremity Angiogram;  Surgeon: Conrad [place], MD;  Location: [facility];  Service: Cardiovascular;  Laterality: Bilateral;  . [place]   "lower"  . PERIPHERAL VASCULAR CATHETERIZATION N/A 07/30/2015   Procedure: Abdominal Aortogram;  Surgeon: Conrad Canute, MD;  Location: [facility];  Service: Cardiovascular;  Laterality: N/A;  . PERIPHERAL VASCULAR CATHETERIZATION N/A 10/24/2016   Procedure: Abdominal Aortogram;  Surgeon: Angelia Mould, MD;  Location: [place] [facility];  Service: Cardiovascular;  Laterality: N/A;  . PERIPHERAL VASCULAR CATHETERIZATION N/A 10/24/2016   Procedure: Lower Extremity Angiography;  Surgeon: Angelia Mould, MD;  Location: [facility];  Service: Cardiovascular;  Laterality: N/A;  . PERIPHERAL VASCULAR INTERVENTION Right 12/11/2017   Procedure: PERIPHERAL VASCULAR INTERVENTION;  Surgeon: Angelia Mould, MD;  Location: [place];  Service: Cardiovascular;  Laterality: Right;  Marland Kitchen VASCULAR SURGERY   ~ 2007   Stent SFA   . VEIN HARVEST Left 01/06/2016   Procedure: LEFT GREATER SAPPHENOUS VEIN HARVEST;  Surgeon: Mal Misty, MD;  Location: [facility];  Service: Vascular;  Laterality: Left;    Allergies  Allergen Reactions  . Glipizide Other (See Comments)    REACTION IS SIDE EFFECT Severe hypoglycemia to 40s.     Prior to Admission medications   Medication Sig Start Date End Date Taking? Authorizing Provider  atorvastatin (LIPITOR) 40 MG tablet TAKE 1 TABLET BY MOUTH DAILY 03/08/19   Anderson, Chelsey L, DO  benazepril (LOTENSIN) 5 MG tablet TAKE 1 TABLET BY MOUTH EVERY DAY 09/12/19   Anderson, Chelsey L, DO  BYSTOLIC 20 MG TABS TAKE 1 TABLET BY MOUTH EVERY DAY 11/21/19   Doristine Mango L, DO  clopidogrel (PLAVIX) 75 MG tablet Take 1 tablet (75 mg total) by mouth daily. 10/21/19   Anderson, Chelsey L, DO  [facility] ACETAMINOPHEN EX ST 500 MG tablet TAKE 2 TABLETS (1,000 MG TOTAL) BY MOUTH EVERY 8 (EIGHT) HOURS AS NEEDED FOR MODERATE PAIN. 04/17/19   Anderson, Chelsey L, DO  [facility] SENNA 8.6 MG tablet TAKE 1 TABLET (8.6 MG TOTAL) BY MOUTH AT BEDTIME. 12/25/18   [provider]  ezetimibe (ZETIA) 10 MG tablet TAKE  1 TABLET BY MOUTH ONCE DAILY 11/21/19   Ouida Sills, Chelsey L, DO  FLUoxetine (PROZAC) 10 MG capsule TAKE 1 CAPSULE BY MOUTH ONCE DAILY 11/21/19   Ouida Sills, Chelsey L, DO  gabapentin (NEURONTIN) 100 MG capsule TAKE 1 CAPSULE (100 MG TOTAL) BY MOUTH AT BEDTIME. 02/25/19   Anderson, Chelsey L, DO  metFORMIN (GLUCOPHAGE) 1000 MG tablet TAKE ONE (1) TABLET BY MOUTH TWICE DAILY 09/12/19   Anderson, Chelsey L, DO  naproxen sodium (ALEVE) 220 MG tablet Take 220 mg by mouth.    [provider]  oxyCODONE (ROXICODONE) 5 MG immediate release tablet Take 1 tablet (5 mg total) by mouth every 4 (four) hours as needed for up to 3 days for severe pain. 12/18/19 12/21/19  Guadalupe Dawn, MD  pantoprazole (PROTONIX) 40 MG tablet TAKE ONE (1) TABLET BY MOUTH ONCE DAILY 11/21/19   Doristine Mango L, DO  ranitidine (ZANTAC 75) 75 MG tablet Take 1 tablet (75 mg total) by mouth 2 (two) times daily as needed for heartburn (heartburn, acid reflux). 01/18/19   Richarda Osmond, DO    Social History   Socioeconomic History  . Marital status: Significant Other    Spouse name: Not on file  . Number of children: 3  . Years of education: 58  . Highest education level: Some college, no degree  Occupational History  . Not on file  Tobacco Use  . Smoking status: Current Some Day Smoker    Years: 24.00    Types: Cigars  . Smokeless tobacco: Never Used  . Tobacco comment: smokes 1 cigar a day  Substance and Sexual Activity  . Alcohol use: Yes    Alcohol/week: 2.0 standard drinks    Types: 2 Cans of beer per week  . Drug use: No  . Sexual activity: Yes  Other Topics Concern  . Not on file  Social History Narrative   Lives alone. Lives in an apartment. Ground floor apartment. Smoke alarms present, no grab bars.    Has a shih-tzu, named Missy.    Patient eats a variety of foods. Drinks tea, Kool-aid, coffee      Use public transportation or brother will take places.   Some food insecurity.      Social Determinants of Health   Financial Resource Strain:   . Difficulty of Paying Living Expenses: Not on file  Food Insecurity:   . Worried About Charity fundraiser in the Last Year: Not on file  . Ran Out of Food in the Last Year: Not on file  Transportation Needs:   . Lack of Transportation (Medical): Not on file  . Lack of Transportation (Non-Medical): Not on file  Physical Activity:   . Days of Exercise per Week: Not on file  . Minutes of Exercise per Session: Not on file  Stress:   . Feeling of Stress : Not on file  Social Connections:   . Frequency of Communication with Friends and Family: Not on file  . Frequency of Social Gatherings with Friends and Family: Not on file  . Attends Religious Services: Not on file  . Active Member of Clubs or Organizations: Not  on file  . Attends Archivist Meetings: Not on file  . Marital Status: Not on file  Intimate Partner Violence:   . Fear of Current or Ex-Partner: Not on file  . Emotionally Abused: Not on file  . Physically Abused: Not on file  . Sexually Abused: Not on file  Family History  Problem Relation Age of Onset  . Cancer Mother        colon Cancer  . Hyperlipidemia Mother   . Diabetes Mother   . Heart disease Father   . Hypertension Father   . Cancer Sister        Uterine  . Diabetes Sister   . Diabetes Brother     ROS: [x]  Positive   [ ]  Negative   [ ]  All sytems reviewed and are negative  Cardiovascular: []  chest pain/pressure []  palpitations []  SOB lying flat []  DOE []  pain in legs while walking []  pain in legs at rest []  pain in legs at night []  non-healing ulcers []  hx of DVT []  swelling in legs  Pulmonary: []  productive cough []  asthma/wheezing []  home O2  Neurologic: []  weakness in []  arms [x]  legs (left foot) []  numbness in []  arms []  legs []  hx of CVA []  mini stroke [] difficulty speaking or slurred speech []  temporary loss of vision in one eye []  dizziness  Hematologic: []  hx of cancer []  bleeding problems []  problems with blood clotting easily  Endocrine:   []  diabetes []  thyroid disease  GI []  vomiting blood []  blood in stool  GU: []  CKD/renal failure []  HD--[]  M/W/F or []  T/T/S []  burning with urination []  blood in urine  Psychiatric: []  anxiety []  depression  Musculoskeletal: []  arthritis []  joint pain  Integumentary: []  rashes []  ulcers  Constitutional: []  fever []  chills   Physical Examination  Vitals:   12/19/19 0905  BP: 94/64  Pulse: 63  Resp: 14  Temp: (!) 97.3 F (36.3 C)  SpO2: 100%   There is no height or weight on file to calculate BMI.  General:  NAD Gait: Not observed HENT: WNL, normocephalic Pulmonary: normal non-labored breathing, without Rales, rhonchi,  wheezing Cardiac: regular,  without  Murmurs, rubs or gallops Abdomen: soft, NT/ND, no masses Vascular Exam/Pulses:  Right Left  Radial    Ulnar    Femoral 1+ (weak) 2+ (normal)  Popliteal    DP Non-palpable No signal  PT Non-palpable No signal   Extremities: No open wounds, left foot cold Musculoskeletal: no muscle wasting or atrophy  Neurologic: A&O X 3; Appropriate Affect ; SENSATION: left foot numb, toes weak =  CBC    Component Value Date/Time   WBC 7.7 06/10/2018 0841   RBC 6.66 (H) 06/10/2018 0841   HGB 14.5 06/10/2018 0841   HGB 9.2 (L) 04/05/2018 1030   HCT 48.3 06/10/2018 0841   HCT 32.0 (L) 04/05/2018 1030   PLT 354 06/10/2018 0841   PLT 316 04/05/2018 1030   MCV 72.5 (L) 06/10/2018 0841   MCV 65 (L) 04/05/2018 1030   MCH 21.8 (L) 06/10/2018 0841   MCHC 30.0 06/10/2018 0841   RDW 19.5 (H) 06/10/2018 0841   RDW 30.3 (H) 04/05/2018 1030   LYMPHSABS 1.6 03/27/2018 1637   LYMPHSABS 2.4 03/26/2018 1548   MONOABS 0.4 03/27/2018 1637   EOSABS 0.2 03/27/2018 1637   EOSABS 0.2 03/26/2018 1548   BASOSABS 0.0 03/27/2018 1637   BASOSABS 0.0 03/26/2018 1548    BMET    Component Value Date/Time   NA 132 (L) 06/10/2018 0841   K 4.2 06/10/2018 0841   CL 99 (L) 06/10/2018 0841   CO2 22 06/10/2018 0841   GLUCOSE 164 (H) 06/10/2018 0841   BUN 9 06/10/2018 0841   CREATININE 0.84 06/10/2018 0841   CREATININE 0.94 03/31/2015 1454   CALCIUM 9.5 06/10/2018  0841   GFRNONAA >60 06/10/2018 0841   GFRAA >60 06/10/2018 0841    COAGS: Lab Results  Component Value Date   INR 1.13 04/01/2017   INR 1.13 11/09/2016   INR 1.11 12/31/2015     Non-Invasive Vascular Imaging:    None   ASSESSMENT/PLAN: This is a 68 y.o. male that presents with acute on chronic limb ischemia of the left lower extremity with an occluded left fem peroneal prosthetic bypass that was performed in 2017 by Dr. Scot Dock.  Patient has no signals in left foot and is having severe pain 8 out of 10.  Discussed option of  thrombolysis but given the chronicity of symptoms for 3 to 4 weeksnot sure this is going to be beneficial.  Only has peroneal outflow and this is occluded as well.  Subsequently recommended left lower extremity thrombectomy in the OR.  Have discussed with the ER to start heparin drip.  Please see the patient n.p.o.  Rapid Covid test has been ordered.  Discussed with patient high risk for limb loss.  Marty Heck, MD [facility] Office: [phone] Pager: [phone]

## 2019-12-19 NOTE — Assessment & Plan Note (Addendum)
Patient with obvious and apparent PAD on exam.  All the classic symptoms of progressive ischemia of his distal foot.  Numbness and tingling progressing to out right pain, distribution of the anterior tibial artery, erythematous to dusky appearance transition all underlying worsening of ischemia in the left foot.  Discussed going to the emergency department versus any patient in for a stat ABI with reflex left lower extremity arterial Doppler.  Able to get patient in for the study at 8 AM on 12/24.  Gave my personal number to discuss results versus sending patient to emergency department if results concerning enough.

## 2019-12-19 NOTE — ED Provider Notes (Signed)
[facility] EMERGENCY DEPARTMENT Provider Note   CSN: [id number] Arrival date & time: 12/19/19  0901     History Chief Complaint  Patient presents with  . Abnormal ECG    [name] is a 68 y.o. male with a history of right iliac stent placed by Dr. Joylene Igo in 2018, on Plavix, history of hypertension and cigar smoking, presented to emergency department with concern for claudication and pain in his left foot, and absent blood flow on arterial duplex performed this morning.  Patient was referred into the ED with concern for no dopplerable pulse in his left foot.  Arterial duplex scan prior to arrival showed no blood flow in the distal extremity.  Patient reports to me that he has been having increasing pain in his left foot for the past several weeks.  The pain is worse with ambulation.  And improves mildly at rest.  Pain only involves the foot and not the rest of the leg.  He denies any numbness in his feet.  He does have a history of diabetes but reports no history of neuropathy.   HPI     Past Medical History:  Diagnosis Date  . Anemia   . Arthritis    OA  . Cervical radiculopathy    Dr. Vertell Limber neurosurgery  . Chronic lower back pain   . Diabetes mellitus    takes Metformin daily  . GERD (gastroesophageal reflux disease)    takes Protonix daily  . History of blood transfusion    "related to low HgB" ((09/10/2015  . Hyperlipidemia    takes Vytorin daily  . Hypertension    takes Benazepril and Bystolic daily  . PAD (peripheral artery disease) ([place])   . Pneumonia   . Shortness of breath dyspnea    with exertion  . Tobacco user   . Toe fracture, right 05/09/2011    Patient Active Problem List   Diagnosis Date Noted  . Ischemia of foot 12/11/2019  . Acute pain of left shoulder 11/18/2019  . Lung nodule < 6cm on CT 01/28/2019  . Type 2 diabetes with complication ([place]) [id number]  . Radiculopathy, cervical region 02/11/2017  . Depression  10/03/2016  . Vision changes 02/02/2016  . Claudication of both lower extremities ([place]) 07/15/2015  . Proteinuria 08/27/2014  . Diabetic neuropathy ([place]) 02/27/2014  . Iron deficiency anemia 08/01/2012  . OBESITY, NOS 02/22/2007  . Tobacco abuse 02/22/2007  . HYPERTENSION, BENIGN SYSTEMIC 02/22/2007  . PAD (peripheral artery disease) ([place]) 02/22/2007  . Osteoarthritis 02/22/2007    Past Surgical History:  Procedure Laterality Date  . ABDOMINAL AORTOGRAM W/LOWER EXTREMITY N/A 12/11/2017   Procedure: ABDOMINAL AORTOGRAM W/LOWER EXTREMITY;  Surgeon: Angelia Mould, MD;  Location: [facility];  Service: Cardiovascular;  Laterality: N/A;  . ANTERIOR CERVICAL DECOMP/DISCECTOMY FUSION  03/08/12   C6-7  . ANTERIOR CERVICAL DECOMP/DISCECTOMY FUSION  03/08/2012   Procedure: ANTERIOR CERVICAL DECOMPRESSION/DISCECTOMY FUSION 1 LEVEL/HARDWARE REMOVAL;  Surgeon: Erline Levine, MD;  Location: [facility];  Service: Neurosurgery;  Laterality: N/A;  revison of C5-7 anterior cervical decompression with fusion with Cervical Five-Thoracic One anterior cervical decompression with fusion with interbody prothesis plating and bonegraft  . BACK SURGERY  1996  . BYPASS GRAFT FEMORAL-PERONEAL Left 11/11/2016   Procedure: REDO LEFT FEMORAL-PERONEAL BYPASS WITH PROPATEN 6MM X 80CM GRAFT;  Surgeon: Angelia Mould, MD;  Location: [facility];  Service: Vascular;  Laterality: Left;  . COLONOSCOPY W/ BIOPSIES AND POLYPECTOMY  08/17/2012  f/u 5 years, 4 polyps, no high grade dysplasia or malignancy, tubular adenoma, hyperplastic polyops  . ENTEROSCOPY N/A 12/11/2015   Procedure: ENTEROSCOPY;  Surgeon: Carol Ada, MD;  Location: [facility] ENDOSCOPY;  Service: Endoscopy;  Laterality: N/A;  . ENTEROSCOPY N/A 03/29/2018   Procedure: ENTEROSCOPY;  Surgeon: Carol Ada, MD;  Location: [facility] ENDOSCOPY;  Service: Endoscopy;  Laterality: N/A;  . ESOPHAGOGASTRODUODENOSCOPY  08/17/2012   normal esophagus and GEJ, diffuse  gastritis with erythema- no malignancy, reactive gastropathy  with focal intestinal metaplasia  . FEMORAL-POPLITEAL BYPASS GRAFT Left 01/06/2016   Procedure: Left  COMMON FEMORAL-BELOW KNEE POPLITEAL ARTERY Bypass using non-reversed translocated saphenous vein graft from left leg;  Surgeon: Mal Misty, MD;  Location: [facility];  Service: Vascular;  Laterality: Left;  . GIVENS CAPSULE STUDY N/A 11/24/2015   Procedure: GIVENS CAPSULE STUDY;  Surgeon: Juanita Craver, MD;  Location: [facility];  Service: Endoscopy;  Laterality: N/A;  . HOT HEMOSTASIS N/A 03/29/2018   Procedure: HOT HEMOSTASIS (ARGON PLASMA COAGULATION/BICAP);  Surgeon: Carol Ada, MD;  Location: [facility];  Service: Endoscopy;  Laterality: N/A;  . INGUINAL HERNIA REPAIR  1990's   right  . INTRAOPERATIVE ARTERIOGRAM Left 01/06/2016   Procedure: INTRA OPERATIVE ARTERIOGRAM LEFT LOWER LEG;  Surgeon: Mal Misty, MD;  Location: [facility];  Service: Vascular;  Laterality: Left;  . INTRAOPERATIVE ARTERIOGRAM Left 11/11/2016   Procedure: INTRA OPERATIVE ARTERIOGRAM LEFT LOWER EXTRIMITY;  Surgeon: Angelia Mould, MD;  Location: [facility];  Service: Vascular;  Laterality: Left;  . IR ANGIOGRAM FOLLOW UP STUDY  12/11/2017  . IR GENERIC HISTORICAL  10/24/2016   IR ANGIOGRAM FOLLOW UP STUDY  . LOWER EXTREMITY ANGIOGRAM Bilateral 07/30/2015   Procedure: Lower Extremity Angiogram;  Surgeon: Conrad Whitefish, MD;  Location: [facility];  Service: Cardiovascular;  Laterality: Bilateral;  . [place]   "lower"  . PERIPHERAL VASCULAR CATHETERIZATION N/A 07/30/2015   Procedure: Abdominal Aortogram;  Surgeon: Conrad [place], MD;  Location: [facility];  Service: Cardiovascular;  Laterality: N/A;  . PERIPHERAL VASCULAR CATHETERIZATION N/A 10/24/2016   Procedure: Abdominal Aortogram;  Surgeon: Angelia Mould, MD;  Location: [facility];  Service: Cardiovascular;  Laterality: N/A;  . PERIPHERAL VASCULAR  CATHETERIZATION N/A 10/24/2016   Procedure: Lower Extremity Angiography;  Surgeon: Angelia Mould, MD;  Location: [facility];  Service: Cardiovascular;  Laterality: N/A;  . PERIPHERAL VASCULAR INTERVENTION Right 12/11/2017   Procedure: PERIPHERAL VASCULAR INTERVENTION;  Surgeon: Angelia Mould, MD;  Location: [facility];  Service: Cardiovascular;  Laterality: Right;  Mar[facility] Kitchen VASCULAR SURGERY  ~ 2007   Stent SFA   . VEIN HARVEST Left 01/06/2016   Procedure: LEFT GREATER [place];  Surgeon: Mal Misty, MD;  Location: [facility] OR;  Service: Vascular;  Laterality: Left;       Family History  Problem Relation Age of Onset  . Cancer Mother        colon Cancer  . Hyperlipidemia Mother   . Diabetes Mother   . Heart disease Father   . Hypertension Father   . Cancer Sister        Uterine  . Diabetes Sister   . Diabetes Brother     Social History   Tobacco Use  . Smoking status: Current Some Day Smoker    Years: 24.00    Types: Cigars  . Smokeless tobacco: Never Used  . Tobacco comment: smokes 1 cigar a  day  Substance Use Topics  . Alcohol use: Yes    Alcohol/week: 2.0 standard drinks    Types: 2 Cans of beer per week  . Drug use: No    Home Medications Prior to Admission medications   Medication Sig Start Date End Date Taking? Authorizing Provider  atorvastatin (LIPITOR) 40 MG tablet TAKE 1 TABLET BY MOUTH DAILY Patient taking differently: Take 40 mg by mouth daily.  03/08/19  Yes Anderson, Chelsey L, DO  benazepril (LOTENSIN) 5 MG tablet TAKE 1 TABLET BY MOUTH EVERY DAY Patient taking differently: Take 5 mg by mouth daily.  09/12/19  Yes Anderson, Chelsey L, DO  BYSTOLIC 20 MG TABS TAKE 1 TABLET BY MOUTH EVERY DAY Patient taking differently: Take 20 mg by mouth daily.  11/21/19  Yes Anderson, Chelsey L, DO  clopidogrel (PLAVIX) 75 MG tablet Take 1 tablet (75 mg total) by mouth daily. 10/21/19  Yes Anderson, Chelsey L, DO  [employer] ACETAMINOPHEN  EX ST 500 MG tablet TAKE 2 TABLETS (1,000 MG TOTAL) BY MOUTH EVERY 8 (EIGHT) HOURS AS NEEDED FOR MODERATE PAIN. Patient taking differently: Take 1,000 mg by mouth every 8 (eight) hours as needed for moderate pain.  04/17/19  Yes Anderson, Chelsey L, DO  FLUoxetine (PROZAC) 10 MG capsule TAKE 1 CAPSULE BY MOUTH ONCE DAILY Patient taking differently: Take 10 mg by mouth daily.  11/21/19  Yes Anderson, Chelsey L, DO  gabapentin (NEURONTIN) 100 MG capsule TAKE 1 CAPSULE (100 MG TOTAL) BY MOUTH AT BEDTIME. Patient taking differently: Take 100 mg by mouth at bedtime.  02/25/19  Yes Anderson, Chelsey L, DO  metFORMIN (GLUCOPHAGE) 1000 MG tablet TAKE ONE (1) TABLET BY MOUTH TWICE DAILY Patient taking differently: Take 1,000 mg by mouth 2 (two) times daily with a meal.  09/12/19  Yes Anderson, Chelsey L, DO  oxyCODONE (ROXICODONE) 5 MG immediate release tablet Take 1 tablet (5 mg total) by mouth every 4 (four) hours as needed for up to 3 days for severe pain. 12/18/19 12/21/19 Yes Guadalupe Dawn, MD  pantoprazole (PROTONIX) 40 MG tablet TAKE ONE (1) TABLET BY MOUTH ONCE DAILY Patient taking differently: Take 40 mg by mouth daily.  11/21/19  Yes Anderson, Chelsey L, DO  ezetimibe (ZETIA) 10 MG tablet TAKE 1 TABLET BY MOUTH ONCE DAILY Patient not taking: Reported on 12/19/2019 11/21/19   Doristine Mango L, DO  ranitidine (ZANTAC 75) 75 MG tablet Take 1 tablet (75 mg total) by mouth 2 (two) times daily as needed for heartburn (heartburn, acid reflux). Patient not taking: Reported on 12/19/2019 01/18/19   Doristine Mango L, DO    Allergies    Glipizide  Review of Systems   Review of Systems  Constitutional: Negative for chills and fever.  Respiratory: Negative for cough and shortness of breath.   Cardiovascular: Negative for chest pain and palpitations.  Gastrointestinal: Negative for nausea and vomiting.  Musculoskeletal: Positive for arthralgias and myalgias.  Skin: Negative for rash and wound.    Neurological: Positive for weakness. Negative for syncope and numbness.  Psychiatric/Behavioral: Negative for agitation and confusion.  All other systems reviewed and are negative.   Physical Exam Updated Vital Signs BP (!) 187/84   Pulse (!) 59   Temp (!) 97.3 F (36.3 C) (Oral)   Resp (!) 22   Ht 5\' 7"  (1.702 m)   Wt 82.5 kg   SpO2 100%   BMI 28.49 kg/m   Physical Exam Vitals and nursing note reviewed.  Constitutional:  Appearance: He is well-developed.  HENT:     Head: Normocephalic and atraumatic.  Eyes:     Conjunctiva/sclera: Conjunctivae normal.  Cardiovascular:     Rate and Rhythm: Normal rate and regular rhythm.     Comments: Bilateral feet feel warm, no discoloration or coolness of the left foot No palpable pedal or tibial pulse in left foot No audible pulse with doppler at bedside in left foot Pulmonary:     Effort: Pulmonary effort is normal. No respiratory distress.     Breath sounds: Normal breath sounds.  Abdominal:     Palpations: Abdomen is soft.  Musculoskeletal:     Cervical back: Neck supple.  Skin:    General: Skin is warm and dry.  Neurological:     General: No focal deficit present.     Mental Status: He is alert and oriented to person, place, and time.     Sensory: No sensory deficit.     Motor: No weakness.  Psychiatric:        Mood and Affect: Mood normal.        Behavior: Behavior normal.     ED Results / Procedures / Treatments   Labs (all labs ordered are listed, but only abnormal results are displayed) Labs Reviewed  BASIC METABOLIC PANEL - Abnormal; Notable for the following components:      Result Value   Sodium 132 (*)    Chloride 95 (*)    Glucose, Bld 113 (*)    All other components within normal limits  CBC WITH DIFFERENTIAL/PLATELET - Abnormal; Notable for the following components:   RBC 5.85 (*)    Hemoglobin 10.1 (*)    HCT 35.9 (*)    MCV 61.4 (*)    MCH 17.3 (*)    MCHC 28.1 (*)    RDW 24.1 (*)    All  other components within normal limits  GLUCOSE, CAPILLARY - Abnormal; Notable for the following components:   Glucose-Capillary 113 (*)    All other components within normal limits  PROTIME-INR  APTT  HEPARIN LEVEL (UNFRACTIONATED)  POC SARS CORONAVIRUS 2 AG -  ED    EKG None  Radiology VAS [place] ABI WITH/WO TBI  Result Date: 12/19/2019 LOWER EXTREMITY DOPPLER STUDY Indications: Rest pain, and cold foot and discoloration.  Vascular Interventions: 10/2016 left femoral peroneal bypass                         12/2015 left common fem popliteal bypass. Comparison Study: 05/15/19 right 0.55 left 1.08 Performing Technologist: June Leap Rvt, Rdms  Examination Guidelines: A complete evaluation includes at minimum, Doppler waveform signals and systolic blood pressure reading at the level of bilateral brachial, anterior tibial, and posterior tibial arteries, when vessel segments are accessible. Bilateral testing is considered an integral part of a complete examination. Photoelectric Plethysmograph (PPG) waveforms and toe systolic pressure readings are included as required and additional duplex testing as needed. Limited examinations for reoccurring indications may be performed as noted.  ABI Findings: +--------+------------------+-----+----------+-------------------------------+ Right   Rt Pressure (mmHg)IndexWaveform  Comment                         +--------+------------------+-----+----------+-------------------------------+ [id number]                    triphasic                                 +--------+------------------+-----+----------+-------------------------------+  ATA     93                0.48 monophasic                                +--------+------------------+-----+----------+-------------------------------+ PTA                            monophasictoo dampened to obtain pressure +--------+------------------+-----+----------+-------------------------------+  +--------+------------------+-----+----------+-------+ Left    Lt Pressure (mmHg)IndexWaveform  Comment +--------+------------------+-----+----------+-------+ [id number]                    monophasic        +--------+------------------+-----+----------+-------+ ATA                            absent            +--------+------------------+-----+----------+-------+ PTA                            absent            +--------+------------------+-----+----------+-------+  Summary: Right: Resting right ankle-brachial index indicates moderate right lower extremity arterial disease. Left: No Doppler signals obtained. Absent flow.  *See table(s) above for measurements and observations.    Preliminary    VAS [place] LOWER EXTREMITY ARTERIAL DUPLEX  Result Date: 12/19/2019 LOWER EXTREMITY ARTERIAL DUPLEX STUDY Indications: Rest pain.  Current ABI: absent Comparison Study: 05/15/19 Performing Technologist: June Leap RDMS, RVT  Examination Guidelines: A complete evaluation includes B-mode imaging, spectral Doppler, color Doppler, and power Doppler as needed of all accessible portions of each vessel. Bilateral testing is considered an integral part of a complete examination. Limited examinations for reoccurring indications may be performed as noted.  +-----------+--------+-----+--------+----------------+-------------------------+ LEFT       PSV cm/sRatioStenosisWaveform        Comments                  +-----------+--------+-----+--------+----------------+-------------------------+ ATA Distal                      absent                                    +-----------+--------+-----+--------+----------------+-------------------------+ PTA Distal                      severely        venous appearance of flow                                 dampened                                  +-----------+--------+-----+--------+----------------+-------------------------+ PERO Mid                 occluded                                          +-----------+--------+-----+--------+----------------+-------------------------+ PERO Distal             occluded                                          +-----------+--------+-----+--------+----------------+-------------------------+  Left Graft #1: Common femoral to BK popliteal +--------------------+--------+--------+--------+--------+                     PSV cm/sStenosisWaveformComments +--------------------+--------+--------+--------+--------+ Inflow                                               +--------------------+--------+--------+--------+--------+ Proximal Anastomosis                                 +--------------------+--------+--------+--------+--------+ Proximal Graft              occluded                 +--------------------+--------+--------+--------+--------+ Mid Graft                   occluded                 +--------------------+--------+--------+--------+--------+ Distal Graft                occluded                 +--------------------+--------+--------+--------+--------+ Distal Anastomosis                                   +--------------------+--------+--------+--------+--------+ Outflow                                              +--------------------+--------+--------+--------+--------+  Left Graft #2: Femoral to peroneal +------------------+--------+--------+--------+--------+                   PSV cm/sStenosisWaveformComments +------------------+--------+--------+--------+--------+ Proximal Graft            occluded                 +------------------+--------+--------+--------+--------+ Mid Graft                 occluded                 +------------------+--------+--------+--------+--------+ Distal Graft              occluded                 +------------------+--------+--------+--------+--------+ Distal Anastomosis        occluded                  +------------------+--------+--------+--------+--------+   Summary: Left: Occluded grafts in lower extremity.  See table(s) above for measurements and observations.    Preliminary     Procedures .Critical Care Performed by: Wyvonnia Dusky, MD Authorized by: Wyvonnia Dusky, MD   Critical care provider statement:    Critical care time (minutes):  30   Critical care was necessary to treat or prevent imminent or life-threatening deterioration of the following conditions:  Circulatory failure   Critical care was time spent personally by me on the following activities:  Discussions with consultants, evaluation of patient's response to treatment, examination of patient, ordering and performing treatments and interventions, ordering and review of laboratory studies, ordering and review of radiographic studies, pulse oximetry, re-evaluation of patient's condition, obtaining history from patient or surrogate and review of old charts  Comments:     IV heparin for vascular occlusion of the lower extremity   (including critical care time)  Medications Ordered in ED Medications  heparin ADULT infusion 100 units/mL (25000 units/222mL sodium chloride 0.45%) (0 Units/hr Intravenous Stopped 12/19/19 1244)  lactated ringers infusion ( Intravenous New Bag/Given 12/19/19 1136)  fentaNYL (SUBLIMAZE) 100 MCG/2ML injection (has no administration in time range)  ceFAZolin (ANCEF) 2-4 GM/100ML-% IVPB (has no administration in time range)  0.9 % irrigation (POUR BTL) (2,000 mLs Irrigation Given 12/19/19 1236)  heparin 6,000 Units in sodium chloride 0.9 % 500 mL irrigation (500 mLs Irrigation Given 12/19/19 1236)  heparin bolus via infusion 4,200 Units (4,200 Units Intravenous Bolus from Bag 12/19/19 1041)  fentaNYL (SUBLIMAZE) injection 50 mcg (50 mcg Intravenous Given 12/19/19 1200)  ceFAZolin (ANCEF) IVPB 2g/100 mL premix (2 g Intravenous Given 12/19/19 1229)    ED Course  I have reviewed the  triage vital signs and the nursing notes.  Pertinent labs & imaging results that were available during my care of the patient were reviewed by me and considered in my medical decision making (see chart for details).  68 year old male presenting to the ED with concerns for absent pulses in the left foot.  He reports he had claudication increasing with past several weeks.  Suspect is not acute emboli, likely he may have collateral flow, his foot does not feel cold but there are no pulses palpable.  We will place a stat consult to vascular surgery.  We will place an IV and begin labs.  Likely he will need heparin.    Clinical Course as of Dec 18 1320  Thu Dec 19, 2019  [id number] Discussed with Dr Carlis Abbott who recommended IV heparin, NPO, rapid covid for the OR, no CTA needed, plan to take patient to OR   [MT]    Clinical Course User Index [MT] Langston Masker Carola Rhine, MD    Final Clinical Impression(s) / ED Diagnoses Final diagnoses:  Vascular occlusion    Rx / DC Orders ED Discharge Orders    None       Wyvonnia Dusky, MD 12/19/19 1322

## 2019-12-19 NOTE — Op Note (Signed)
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  Thrombectomy of left femoral to peroneal artery prosthetic bypass 2.  Thrombectomy of left peroneal artery 3.  Bovine pericardial patch angioplasty of distal femoral to peroneal bypass anastomosis  Surgeon: Dr. Marty Heck, MD  Assistant: Leontine Locket, PA  Indications: [name] well-known to the vascular surgery service.  He previously had a left femoral to below-knee popliteal bypass that occluded in 2017.  He was subsequently treated with a fem peroneal bypass with prosthetic in 2017 by Dr. Scot Dock.  He has been having pain in his left foot since November for at least the last 3 to 4 weeks.  Ultimately presented for outpatient imaging today and was found to have no flow at the foot.  His bypass was also noted to be occluded.  He presents today for thrombectomy after risks and benefits discussed.  Findings: There was no flow in the bypass.  Ultimately the distal bypass was opened onto the peroneal artery where there was subacute to chronic appearing thrombus.  Once this was all retrieved there was backbleeding from the peroneal artery.  We were able to get inflow again using a #4 Fogarty in the fem peroneal bypass where there was subacute thrombus retrieved.  A bovine pericardial patch was sewn on the distal femoral peroneal bypass anastmosis.  Patient had a pulse in the bypass and a excellent peroneal signal at completion of the case.  Anesthesia: General  Details: Patient was taken operating room after informed consent was obtained.  He was placed on the operative table supine position.  General endotracheal anesthesia was induced.  The left leg was then prepped and draped in usual sterile fashion.  Preop timeout was performed identify patient, procedure and  site.  Initially made a longitudinal incision on the medial calf just behind the tibia at his old incision.  Dissected down with Bovie cautery opened the subcutaneous tissue and then reflected the gastrocnemius and soleus muscle in order to enter the deep posterior compartment.  Initially encountered the posterior tibial veins and arteries.  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  The veins were extensively scarred and in our dissection to get into the deep posterior compartment we had multiple venous injuries had to be repaired with 6-0 Prolene's.  Ultimately finally identified the prosthetic bypass and ultimately this was dissected free circumferentially and traced down to the peroneal artery.  We used the cerebellum retractors for added visualization.  There was no flow in the bypass.  Ultimately I used a 11 blade scalpel opened the bypass and then extended this toward the peroneal artery.  After initially opening the bypass I realized I was not completely onto the distal anastomosis.  Extensive dissection had to be carried out with Metzenbaum scissors carefully in order to get more distal exposure to visualize the anastomosis.  Again there was peroneal vein had to be repaired during this procedure.  Finally once we got down to the distal anastomosis there was subacute and chronic appearing thrombus in the anastomosis and no backbleeding.  All of this was then retrieved with DeBakey forceps.  I then passed a #2 Fogarty down the peroneal with no resistance and did not retrieve any additional thrombus and had backbleeding once the subacute and chronic thrombus was retrieved.  Thrombus was retrieved from the distal  anastomosis.  That point in time I then passed a #3 and then a #4 Fogarty proximally in the bypass and ultimately retrieved one small arterial plug and then had excellent inflow.  At that point time we got control the bypass with a fistula clamp.  We then exsanguinated the  leg with an Esmarch and went up on our tourniquet on the thigh once we had given 100 units/kg heparin.  At that point time we then had control the backbleeding from the peroneal artery.  Ultimately the tourniquet was inflated and deflated multiple times about the procedure in order to prevent prolonged ischemia.  A bovine pericardial patch was brought in the field and I extended the arteriotomy distally onto the peroneal artery.  I then retrieved some additional subacute to chronic appearing thrombus.  That point in time I then sewed a bovine pericardial patch using a 6-0 Prolene parachute format.  We de-aired everything upon completion.  Once we came off clamps there were multiple leaks in the patch and had to be repaired with interrupted 6-0 Prolene's.  Finally once we got hemostasis I checked with a Doppler distally and there was excellent peroneal signal.  I then went down to the foot and there was also peroneal signal.  Protamine was given for reversal.  The wound was copiously irrigated.  Wwe were very meticulously getting hemostasis.  I then used Surgicel snow for added hemostasis.  The wound was then closed with running 3-0 Vicryl and 4-0 Monocryl in the skin and Dermabond.  Complication: None  Condition: Stable  Marty Heck, MD [facility] Office: [phone] Pager: [place]

## 2019-12-19 NOTE — Progress Notes (Signed)
ANTICOAGULATION CONSULT NOTE - Initial Consult  Pharmacy Consult for heparin Indication: VTE treatment  Allergies  Allergen Reactions  . Glipizide Other (See Comments)    REACTION IS SIDE EFFECT Severe hypoglycemia to 40s.     Patient Measurements: Height: 5\' 7"  (170.2 cm) Weight: 181 lb 14.1 oz (82.5 kg) IBW/kg (Calculated) : 66.1 Heparin Dosing Weight: 82.5 kg  Vital Signs: Temp: 97.3 F (36.3 C) (12/24 0905) Temp Source: Oral (12/24 0905) BP: 94/64 (12/24 0905) Pulse Rate: 63 (12/24 0905)  Labs: No results for input(s): HGB, HCT, PLT, APTT, LABPROT, INR, HEPARINUNFRC, HEPRLOWMOCWT, CREATININE, CKTOTAL, CKMB, TROPONINIHS in the last 72 hours.  CrCl cannot be calculated (Patient's most recent lab result is older than the maximum 21 days allowed.).   Medical History: Past Medical History:  Diagnosis Date  . Anemia   . Arthritis    OA  . Cervical radiculopathy    Dr. Vertell Limber neurosurgery  . Chronic lower back pain   . Diabetes mellitus    takes Metformin daily  . GERD (gastroesophageal reflux disease)    takes Protonix daily  . History of blood transfusion    "related to low HgB" ((09/10/2015  . Hyperlipidemia    takes Vytorin daily  . Hypertension    takes Benazepril and Bystolic daily  . PAD (peripheral artery disease) ([place])   . Pneumonia   . Shortness of breath dyspnea    with exertion  . Tobacco user   . Toe fracture, right 05/09/2011   Assessment: 68 yom finding occluded grafts in lower extremity on duplex with absent flow. No AC PTA.  Hgb 10.1, plt 273. No s/sx of bleeding.   Goal of Therapy:  Heparin level 0.3-0.7 units/ml Monitor platelets by anticoagulation protocol: Yes   Plan:  Give 4200 units bolus x 1 Start heparin infusion at 1350 units/hr Check anti-Xa level in 6 hours and daily while on heparin Continue to monitor H&H and platelets  Antonietta Jewel, PharmD, BCCCP Clinical Pharmacist  Phone: [phone]  Please check AMION for all [place] phone numbers After 10:00 PM, call [place] [phone] 12/19/2019,9:54 AM

## 2019-12-19 NOTE — Anesthesia Postprocedure Evaluation (Signed)
Anesthesia Post Note  Patient: [name]  Procedure(s) Performed: LEFT FEMERAL- PERONEAL THROMBECTOMY (Left Leg Lower) LEFT FEMORAL -PERONEAL PATCH ANGIOPLASTY WITH XENOSURE BIOLOGIC PATCH   (Left Leg Lower)     Patient location during evaluation: PACU Anesthesia Type: General Level of consciousness: awake and alert Pain management: pain level controlled Vital Signs Assessment: post-procedure vital signs reviewed and stable Respiratory status: spontaneous breathing, nonlabored ventilation, respiratory function stable and patient connected to nasal cannula oxygen Cardiovascular status: blood pressure returned to baseline and stable Postop Assessment: no apparent nausea or vomiting Anesthetic complications: no    Last Vitals:  Vitals:   12/19/19 1623 12/19/19 1644  BP: 120/68 115/63  Pulse: 74 72  Resp: (!) 21 20  Temp: 37.2 C 36.8 C  SpO2: 99% 100%    Last Pain:  Vitals:   12/19/19 1644  TempSrc: Oral  PainSc: 6                  Liana Camerer DAVID

## 2019-12-19 NOTE — Anesthesia Procedure Notes (Signed)
Procedure Name: LMA Insertion Date/Time: 12/19/2019 12:28 PM Performed by: Orlie Dakin, CRNA Pre-anesthesia Checklist: Patient identified, Emergency Drugs available, Suction available and Patient being monitored Patient Re-evaluated:Patient Re-evaluated prior to induction Oxygen Delivery Method: Circle system utilized Preoxygenation: Pre-oxygenation with 100% oxygen Induction Type: IV induction LMA: LMA inserted LMA Size: 4.0 Tube type: Oral Number of attempts: 1 Placement Confirmation: positive ETCO2 Tube secured with: Tape Dental Injury: Teeth and Oropharynx as per pre-operative assessment

## 2019-12-19 NOTE — ED Triage Notes (Signed)
Pt. sentr by vascular , abnormal EKG.

## 2019-12-19 NOTE — Assessment & Plan Note (Signed)
Patient initially treated for cellulitis with Bactrim for a week with no results.  Patient has developed clear ischemia of his left foot.  Would be concerned about the patency of his left common femoral to popliteal bypass.  Discussed with patient regarding send him straight to the emergency department versus getting a stat ABI with arterial Doppler, patient opted to have study done as soon as possible which was scheduled for 8 AM on 12/24.  I will give him a 3-day supply of oxycodone to help with the acute pain.  I would anticipate vascular intervention in the very near future given the progression of his symptoms over the last week.

## 2019-12-19 NOTE — Transfer of Care (Signed)
Immediate Anesthesia Transfer of Care Note  Patient: [name]  Procedure(s) Performed: LEFT FEMERAL- PERONEAL THROMBECTOMY (Left Leg Lower) LEFT FEMORAL -PERONEAL PATCH ANGIOPLASTY WITH XENOSURE BIOLOGIC PATCH   (Left Leg Lower)  Patient Location: PACU  Anesthesia Type:General  Level of Consciousness: awake, alert  and oriented  Airway & Oxygen Therapy: Patient Spontanous Breathing and Patient connected to nasal cannula oxygen  Post-op Assessment: Report given to RN and Post -op Vital signs reviewed and stable  Post vital signs: Reviewed and stable  Last Vitals:  Vitals Value Taken Time  BP 123/53 12/19/19 1523  Temp    Pulse 74 12/19/19 1525  Resp 27 12/19/19 1525  SpO2 100 % 12/19/19 1525  Vitals shown include unvalidated device data.  Last Pain:  Vitals:   12/19/19 1159  TempSrc:   PainSc: 10-Worst pain ever      Patients Stated Pain Goal: 4 ([id number])  Complications: No apparent anesthesia complications

## 2019-12-19 NOTE — Progress Notes (Signed)
Pt arrived from PACU. C/a/ox4. Vitals stable. Pt given chg bath. Telebox 16 applied ccmd notified. Pt oriented to room. Call bell within reach. Will continue to monitor.  Jerald Kief, RN

## 2019-12-19 NOTE — Anesthesia Preprocedure Evaluation (Addendum)
Anesthesia Evaluation  Patient identified by MRN, date of birth, ID band Patient awake    Reviewed: Allergy & Precautions, NPO status , Patient's Chart, lab work & pertinent test results  Airway Mallampati: I  TM Distance: >3 FB Neck ROM: Full    Dental   Pulmonary Current Smoker,    Pulmonary exam normal        Cardiovascular hypertension, Pt. on medications + Peripheral Vascular Disease  Normal cardiovascular exam     Neuro/Psych Depression    GI/Hepatic GERD  Medicated and Controlled,  Endo/Other  diabetes, Type 2, Oral Hypoglycemic Agents  Renal/GU      Musculoskeletal   Abdominal   Peds  Hematology   Anesthesia Other Findings   Reproductive/Obstetrics                            Anesthesia Physical Anesthesia Plan  ASA: III  Anesthesia Plan: General   Post-op Pain Management:    Induction: Intravenous  PONV Risk Score and Plan: 1 and Ondansetron  Airway Management Planned: LMA  Additional Equipment:   Intra-op Plan:   Post-operative Plan: Extubation in OR  Informed Consent: I have reviewed the patients History and Physical, chart, labs and discussed the procedure including the risks, benefits and alternatives for the proposed anesthesia with the patient or authorized representative who has indicated his/her understanding and acceptance.       Plan Discussed with: CRNA and Surgeon  Anesthesia Plan Comments:         Anesthesia Quick Evaluation

## 2019-12-19 NOTE — Progress Notes (Addendum)
ABI and LLE arterial duplex       have been completed. Preliminary results can be found under CV proc through chart review. June Leap, BS, RDMS, RVT  Patient sent to ED for evaluation due to abnormal arterial studies.

## 2019-12-19 NOTE — Discharge Instructions (Signed)
 [facility]  Discharge instructions  Lower Extremity Bypass Surgery  Please refer to the following instruction for your post-procedure care. Your surgeon or physician assistant will discuss any changes with you.  Activity  You are encouraged to walk as much as you can. You can slowly return to normal activities during the month after your surgery. Avoid strenuous activity and heavy lifting until your doctor tells you it's OK. Avoid activities such as vacuuming or swinging a golf club. Do not drive until your doctor give the OK and you are no longer taking prescription pain medications. It is also normal to have difficulty with sleep habits, eating and bowel movement after surgery. These will go away with time.  Bathing/Showering  Shower daily after you go home. Do not soak in a bathtub, hot tub, or swim until the incision heals completely.  Incision Care  Clean your incision with mild soap and water. Shower every day. Pat the area dry with a clean towel. You do not need a bandage unless otherwise instructed. Do not apply any ointments or creams to your incision. If you have open wounds you will be instructed how to care for them or a visiting nurse may be arranged for you. If you have staples or sutures along your incision they will be removed at your post-op appointment. You may have skin glue on your incision. Do not peel it off. It will come off on its own in about one week.  Wash the groin wound with soap and water daily and pat dry. (No tub bath-only shower)  Then put a dry gauze or washcloth in the groin to keep this area dry to help prevent wound infection.  Do this daily and as needed.  Do not use Vaseline or neosporin on your incisions.  Only use soap and water on your incisions and then protect and keep dry.  Diet  Resume your normal diet. There are no special food restrictions following this procedure. A low fat/ low cholesterol diet is  recommended for all patients with vascular disease. In order to heal from your surgery, it is CRITICAL to get adequate nutrition. Your body requires vitamins, minerals, and protein. Vegetables are the best source of vitamins and minerals. Vegetables also provide the perfect balance of protein. Processed food has little nutritional value, so try to avoid this.  Medications  Resume taking all your medications unless your doctor or physician assistant tells you not to. If your incision is causing pain, you may take over-the-counter pain relievers such as acetaminophen (Tylenol). If you were prescribed a stronger pain medication, please aware these medication can cause nausea and constipation. Prevent nausea by taking the medication with a snack or meal. Avoid constipation by drinking plenty of fluids and eating foods with high amount of fiber, such as fruits, vegetables, and grains. Take Colace 100 mg (an over-the-counter stool softener) twice a day as needed for constipation.  Do not take Tylenol if you are taking prescription pain medications.  Follow Up  Our office will schedule a follow up appointment 2-3 weeks following discharge.  Please call us immediately for any of the following conditions  Severe or worsening pain in your legs or feet while at rest or while walking Increase pain, redness, warmth, or drainage (pus) from your incision site(s) Fever of 101 degree or higher The swelling in your leg with the bypass suddenly worsens and becomes more painful than when you were in the hospital If you have   been instructed to feel your graft pulse then you should do so every day. If you can no longer feel this pulse, call the office immediately. Not all patients are given this instruction.  Leg swelling is common after leg bypass surgery.  The swelling should improve over a few months following surgery. To improve the swelling, you may elevate your legs above the level of your heart while you are  sitting or resting. Your surgeon or physician assistant may ask you to apply an ACE wrap or wear compression (TED) stockings to help to reduce swelling.  Reduce your risk of vascular disease  Stop smoking. If you would like help call [facility] at 1-800-QUIT-NOW (1-800-784-8669) or [facility] at 336-586-4000.  Manage your cholesterol Maintain a desired weight Control your diabetes weight Control your diabetes Keep your blood pressure down  If you have any questions, please call the office at [phone]  

## 2019-12-20 LAB — CBC
HCT: 22.2 % — ABNORMAL LOW (ref 39.0–52.0)
Hemoglobin: 6.3 g/dL — CL (ref 13.0–17.0)
MCH: 17.4 pg — ABNORMAL LOW (ref 26.0–34.0)
MCHC: 28.4 g/dL — ABNORMAL LOW (ref 30.0–36.0)
MCV: 61.2 fL — ABNORMAL LOW (ref 80.0–100.0)
Platelets: 160 10*3/uL (ref 150–400)
RBC: 3.63 MIL/uL — ABNORMAL LOW (ref 4.22–5.81)
RDW: 22.6 % — ABNORMAL HIGH (ref 11.5–15.5)
WBC: 6.8 10*3/uL (ref 4.0–10.5)
nRBC: 0 % (ref 0.0–0.2)

## 2019-12-20 LAB — POCT I-STAT, CHEM 8
BUN: 11 mg/dL (ref 8–23)
Calcium, Ion: 1.15 mmol/L (ref 1.15–1.40)
Chloride: 99 mmol/L (ref 98–111)
Creatinine, Ser: 0.8 mg/dL (ref 0.61–1.24)
Glucose, Bld: 117 mg/dL — ABNORMAL HIGH (ref 70–99)
HCT: 28 % — ABNORMAL LOW (ref 39.0–52.0)
Hemoglobin: 9.5 g/dL — ABNORMAL LOW (ref 13.0–17.0)
Potassium: 5 mmol/L (ref 3.5–5.1)
Sodium: 132 mmol/L — ABNORMAL LOW (ref 135–145)
TCO2: 24 mmol/L (ref 22–32)

## 2019-12-20 LAB — GLUCOSE, CAPILLARY
Glucose-Capillary: 113 mg/dL — ABNORMAL HIGH (ref 70–99)
Glucose-Capillary: 115 mg/dL — ABNORMAL HIGH (ref 70–99)
Glucose-Capillary: 141 mg/dL — ABNORMAL HIGH (ref 70–99)
Glucose-Capillary: 121 mg/dL — ABNORMAL HIGH (ref 70–99)

## 2019-12-20 LAB — BASIC METABOLIC PANEL
Anion gap: 9 (ref 5–15)
BUN: 14 mg/dL (ref 8–23)
CO2: 23 mmol/L (ref 22–32)
Calcium: 8.3 mg/dL — ABNORMAL LOW (ref 8.9–10.3)
Chloride: 100 mmol/L (ref 98–111)
Creatinine, Ser: 0.95 mg/dL (ref 0.61–1.24)
GFR calc Af Amer: >60 mL/min (ref >60)
GFR calc non Af Amer: >60 mL/min (ref >60)
Glucose, Bld: 116 mg/dL — ABNORMAL HIGH (ref 70–99)
Potassium: 4.5 mmol/L (ref 3.5–5.1)
Sodium: 132 mmol/L — ABNORMAL LOW (ref 135–145)

## 2019-12-20 LAB — PREPARE RBC (CROSSMATCH)

## 2019-12-20 MED ORDER — SODIUM CHLORIDE 0.9% IV SOLUTION: Freq: Once | INTRAVENOUS | Status: DC

## 2019-12-20 NOTE — Progress Notes (Addendum)
  Progress Note    12/20/2019 8:55 AM 1 Day Post-Op  Subjective:  Says his foot feels better-it's a little tingly  Afebrile HR 60's-80's NSR 0000000 systolic [id number] RA  Vitals:   12/20/19 0405 12/20/19 0738  BP: 112/76 (!) 112/57  Pulse: 67   Resp: 20 20  Temp: 98.7 F (37.1 C) 98.1 F (36.7 C)  SpO2: 100% 100%    Physical Exam: Cardiac:  regular Lungs:  Non labored Incisions:   Clean and dry; calf is soft Extremities:  Brisk PT/peroneal doppler signals left foot   CBC    Component Value Date/Time   WBC 6.8 12/20/2019 0302   RBC 3.63 (L) 12/20/2019 0302   HGB 6.3 (LL) 12/20/2019 0302   HGB 9.2 (L) 04/05/2018 1030   HCT 22.2 (L) 12/20/2019 0302   HCT 32.0 (L) 04/05/2018 1030   PLT 160 12/20/2019 0302   PLT 316 04/05/2018 1030   MCV 61.2 (L) 12/20/2019 0302   MCV 65 (L) 04/05/2018 1030   MCH 17.4 (L) 12/20/2019 0302   MCHC 28.4 (L) 12/20/2019 0302   RDW 22.6 (H) 12/20/2019 0302   RDW 30.3 (H) 04/05/2018 1030   LYMPHSABS 1.8 12/19/2019 0935   LYMPHSABS 2.4 03/26/2018 1548   MONOABS 0.7 12/19/2019 0935   EOSABS 0.1 12/19/2019 0935   EOSABS 0.2 03/26/2018 1548   BASOSABS 0.0 12/19/2019 0935   BASOSABS 0.0 03/26/2018 1548    BMET    Component Value Date/Time   NA 132 (L) 12/20/2019 0302   K 4.5 12/20/2019 0302   CL 100 12/20/2019 0302   CO2 23 12/20/2019 0302   GLUCOSE 116 (H) 12/20/2019 0302   BUN 14 12/20/2019 0302   CREATININE 0.95 12/20/2019 0302   CREATININE 0.94 03/31/2015 1454   CALCIUM 8.3 (L) 12/20/2019 0302   GFRNONAA >60 12/20/2019 0302   GFRAA >60 12/20/2019 0302    INR    Component Value Date/Time   INR 1.1 12/19/2019 0935     Intake/Output Summary (Last 24 hours) at 12/20/2019 0855 Last data filed at 12/20/2019 0843 Gross per 24 hour  Intake 3726.88 ml  Output 3175 ml  Net 551.88 ml     Assessment:  68 y.o. male is s/p:  1.  Thrombectomy of left femoral to peroneal artery prosthetic bypass 2.  Thrombectomy of left  peroneal artery 3.  Bovine pericardial patch angioplasty of distal femoral to peroneal bypass anastomosis  1 Day Post-Op  Plan: -pt with brisk peroneal and PT doppler signals -has not walked or been out of bed-mobilize today and probably home tomorrow -acute surgical blood loss anemia-2 units PRBC's ordered.  Will check labs in the morning. -DVT prophylaxis:  Sq heparin to start this afternoon.   Leontine Locket, PA-C [facility] [phone] 12/20/2019 8:55 AM  I have seen and evaluated the patient. I agree with the PA note as documented above.  Postop day 1 status post thrombectomy of left fem peroneal prosthetic bypass.  Also patch angioplasty of the distal anastomosis.  Very brisk peroneal and PT Doppler signals today.  Had no signals prior to surgery.  States his foot feels much better.  Will transfuse 2 units for acute blood loss anemia.  Hopefully ambulate today and home tomorrow.  Marty Heck, MD [facility] Office: [phone] Pager: [phone]

## 2019-12-20 NOTE — Evaluation (Signed)
Physical Therapy Evaluation Patient Details Name: [name] MRN: [id number] DOB: 21-Jun-1951 Today's Date: 12/20/2019   History of Present Illness  Pt is a 68 y.o. male admitted 12/19/19 with L foot pain; s/p thrombectomy of left femoral to peroneal artery prosthetic bypass and L peroneal artery 12/24. PMH includes multiple LE vascular interventions, PAD, HTN, LBP, DM, arthritis.    Clinical Impression  Pt presents with an overall decrease in functional mobility secondary to above. PTA, pt mod indep with walking stick, lives with wife available to assist if needed. Educ on precautions, positioning, and importance of mobility. Today, pt able to initiate transfer and gait training, requiring use of RW due to difficulty accepting weight through painful LLE. Further mobility limited by RN starting blood transfusion. Pt would benefit from continued acute PT services to maximize functional mobility and independence prior to d/c with HHPT services.     Follow Up Recommendations Home health PT;Supervision for mobility/OOB    Equipment Recommendations  Rolling walker with 5" wheels;3in1 (PT)    Recommendations for Other Services       Precautions / Restrictions Precautions Precautions: Fall Restrictions Weight Bearing Restrictions: No      Mobility  Bed Mobility Overal bed mobility: Independent             General bed mobility comments: Received sitting EOB  Transfers Overall transfer level: Needs assistance Equipment used: Straight cane;Rolling walker (2 wheeled) Transfers: Sit to/from Stand Sit to Stand: Min guard         General transfer comment: Initial stand with SPC/walking stick but pt unable to take step due to LLE pain, additional stand to RW; min guard, pt reliant on momentum  Ambulation/Gait Ambulation/Gait assistance: Min guard Gait Distance (Feet): 12 Feet Assistive device: Rolling walker (2 wheeled) Gait Pattern/deviations: Step-to pattern;Decreased  weight shift to left;Trunk flexed;Antalgic   Gait velocity interpretation: <1.31 ft/sec, indicative of household ambulator General Gait Details: Pt unable to accept enough weight on LLE to walk with SPC/walking stick, therefore used RW; slow, antalgic steps with min guard, decrease WB through LLE due to pain. Further distance limited by pt getting blood transfusion hung  Stairs            Wheelchair Mobility    Modified Rankin (Stroke Patients Only)       Balance Overall balance assessment: Needs assistance   Sitting balance-Leahy Scale: Good       Standing balance-Leahy Scale: Poor                               Pertinent Vitals/Pain Pain Assessment: Faces Faces Pain Scale: Hurts little more Pain Location: LLE Pain Descriptors / Indicators: Tightness Pain Intervention(s): Monitored during session    Home Living Family/patient expects to be discharged to:: Private residence Living Arrangements: Spouse/significant other Available Help at Discharge: Family;Available 24 hours/day Type of Home: House Home Access: Ramped entrance     Home Layout: One level Home Equipment: Cane - single point      Prior Function Level of Independence: Independent with assistive device(s)         Comments: Mod indep with SPC/walking stick     Hand Dominance        Extremity/Trunk Assessment   Upper Extremity Assessment Upper Extremity Assessment: Overall WFL for tasks assessed    Lower Extremity Assessment Lower Extremity Assessment: LLE deficits/detail LLE Deficits / Details: s/p vascular sx; knee flex/ext motion limited by pain;  funtionally at least 3/5 throughout LLE: Unable to fully assess due to pain LLE Coordination: decreased gross motor       Communication   Communication: No difficulties  Cognition Arousal/Alertness: Awake/alert Behavior During Therapy: WFL for tasks assessed/performed Overall Cognitive Status: Within Functional Limits for  tasks assessed                                        General Comments General comments (skin integrity, edema, etc.): VSS    Exercises     Assessment/Plan    PT Assessment Patient needs continued PT services  PT Problem List Decreased strength;Decreased range of motion;Decreased activity tolerance;Decreased balance;Decreased mobility;Pain       PT Treatment Interventions DME instruction;Gait training;Functional mobility training;Therapeutic activities;Therapeutic exercise;Balance training;Patient/family education    PT Goals (Current goals can be found in the Care Plan section)  Acute Rehab PT Goals Patient Stated Goal: Return home tomorrow PT Goal Formulation: With patient Time For Goal Achievement: 01/04/20 Potential to Achieve Goals: Good    Frequency Min 3X/week   Barriers to discharge        Co-evaluation               AM-PAC PT "6 Clicks" Mobility  Outcome Measure Help needed turning from your back to your side while in a flat bed without using bedrails?: None Help needed moving from lying on your back to sitting on the side of a flat bed without using bedrails?: None Help needed moving to and from a bed to a chair (including a wheelchair)?: A Little Help needed standing up from a chair using your arms (e.g., wheelchair or bedside chair)?: A Little Help needed to walk in hospital room?: A Little Help needed climbing 3-5 steps with a railing? : A Little 6 Click Score: 20    End of Session   Activity Tolerance: Patient tolerated treatment well;Patient limited by pain Patient left: in chair;with call bell/phone within reach;with nursing/sitter in room Nurse Communication: Mobility status;Other (comment)(NT to find chair alarm box and walk with pt after he is done getting blood) PT Visit Diagnosis: Other abnormalities of gait and mobility (R26.89);Pain Pain - Right/Left: Left Pain - part of body: Leg    Time: [id number] PT Time  Calculation (min) (ACUTE ONLY): 17 min   Charges:   PT Evaluation $PT Eval Moderate Complexity: 1 Mod     Mabeline Caras, PT, DPT Acute Rehabilitation Services  Pager [phone] Office [place] 12/20/2019, 9:59 AM

## 2019-12-20 NOTE — Progress Notes (Signed)
CRITICAL VALUE ALERT  Critical Value: Hgb 6.3  Date & Time Notied:  *12/07/2019 4:25am  Provider Notified: Dr. Carlis Abbott (Vascular)   Orders Received/Actions taken:pt EBL 1500 during surgery, transfuse 2 units of RBCs

## 2019-12-21 ENCOUNTER — Encounter: Payer: Self-pay | Admitting: *Deleted

## 2019-12-21 LAB — TYPE AND SCREEN
ABO/RH(D): A POS
Antibody Screen: NEGATIVE
Unit division: 0
Unit division: 0

## 2019-12-21 LAB — CBC
HCT: 27.7 % — ABNORMAL LOW (ref 39.0–52.0)
Hemoglobin: 8.5 g/dL — ABNORMAL LOW (ref 13.0–17.0)
MCH: 20.5 pg — ABNORMAL LOW (ref 26.0–34.0)
MCHC: 30.7 g/dL (ref 30.0–36.0)
MCV: 66.7 fL — ABNORMAL LOW (ref 80.0–100.0)
Platelets: 148 10*3/uL — ABNORMAL LOW (ref 150–400)
RBC: 4.15 MIL/uL — ABNORMAL LOW (ref 4.22–5.81)
RDW: 28.1 % — ABNORMAL HIGH (ref 11.5–15.5)
WBC: 6.8 10*3/uL (ref 4.0–10.5)
nRBC: 0 % (ref 0.0–0.2)

## 2019-12-21 LAB — BPAM RBC
Blood Product Expiration Date: 202101242359
Blood Product Expiration Date: 202101262359
ISSUE DATE / TIME: 202012250930
ISSUE DATE / TIME: 202012251206
Unit Type and Rh: 6200
Unit Type and Rh: 6200

## 2019-12-21 LAB — GLUCOSE, CAPILLARY
Glucose-Capillary: 99 mg/dL (ref 70–99)
Glucose-Capillary: 98 mg/dL (ref 70–99)

## 2019-12-21 MED ORDER — ASPIRIN EC 81 MG PO TBEC: 81.0000 mg | DELAYED_RELEASE_TABLET | Freq: Every day | ORAL | Status: DC

## 2019-12-21 MED ORDER — ASPIRIN 81 MG PO TBEC: 81.0000 mg | DELAYED_RELEASE_TABLET | Freq: Every day | ORAL | Status: DC

## 2019-12-21 MED ORDER — OXYCODONE HCL 5 MG PO TABS: 5.0000 mg | ORAL_TABLET | ORAL | 0 refills | Status: DC | PRN

## 2019-12-21 NOTE — Progress Notes (Signed)
Physical Therapy Treatment Patient Details Name: [name] MRN: [id number] DOB: 05-19-1951 Today's Date: 12/21/2019    History of Present Illness Pt is a 68 y.o. male admitted 12/19/19 with L foot pain; s/p thrombectomy of left femoral to peroneal artery prosthetic bypass and L peroneal artery 12/24. PMH includes multiple LE vascular interventions, PAD, HTN, LBP, DM, arthritis.    PT Comments    Patient is making progress toward PT goals and requires supervision/min guard for OOB mobility using RW. pt continues to have difficulty with L LE weight bearing however with increased gait distance pt reports decreased pain and demonstrates improving gait mechanics. Current plan remains appropriate.     Follow Up Recommendations  Home health PT;Supervision for mobility/OOB     Equipment Recommendations  Rolling walker with 5" wheels;3in1 (PT)    Recommendations for Other Services       Precautions / Restrictions Precautions Precautions: Fall Restrictions Weight Bearing Restrictions: No    Mobility  Bed Mobility Overal bed mobility: Independent                Transfers Overall transfer level: Needs assistance Equipment used: Rolling walker (2 wheeled) Transfers: Sit to/from Stand Sit to Stand: Min guard         General transfer comment: min guard for safety  Ambulation/Gait Ambulation/Gait assistance: Min guard;Supervision Gait Distance (Feet): 70 Feet Assistive device: Rolling walker (2 wheeled) Gait Pattern/deviations: Step-to pattern;Decreased weight shift to left;Trunk flexed;Antalgic Gait velocity: decreased   General Gait Details: min guard initially due to difficulty with L LE weight bearing; cues for upright posture and L heel strike; with distance pt reports decrased pain and able to increase weight bearing and improved posture    Stairs             Wheelchair Mobility    Modified Rankin (Stroke Patients Only)       Balance Overall  balance assessment: Needs assistance   Sitting balance-Leahy Scale: Good     Standing balance support: Bilateral upper extremity supported;During functional activity Standing balance-Leahy Scale: Poor                              Cognition Arousal/Alertness: Awake/alert Behavior During Therapy: WFL for tasks assessed/performed Overall Cognitive Status: Within Functional Limits for tasks assessed                                        Exercises      General Comments        Pertinent Vitals/Pain Pain Assessment: Faces Faces Pain Scale: Hurts little more Pain Location: LLE (initially with weight bearing) Pain Descriptors / Indicators: Tightness;Sore Pain Intervention(s): Monitored during session;Repositioned    Home Living                      Prior Function            PT Goals (current goals can now be found in the care plan section) Progress towards PT goals: Progressing toward goals    Frequency    Min 3X/week      PT Plan Current plan remains appropriate    Co-evaluation              AM-PAC PT "6 Clicks" Mobility   Outcome Measure  Help needed turning from your back to your side  while in a flat bed without using bedrails?: None Help needed moving from lying on your back to sitting on the side of a flat bed without using bedrails?: None Help needed moving to and from a bed to a chair (including a wheelchair)?: A Little Help needed standing up from a chair using your arms (e.g., wheelchair or bedside chair)?: A Little Help needed to walk in hospital room?: A Little Help needed climbing 3-5 steps with a railing? : A Little 6 Click Score: 20    End of Session Equipment Utilized During Treatment: Gait belt Activity Tolerance: Patient tolerated treatment well Patient left: in chair;with call bell/phone within reach Nurse Communication: Mobility status PT Visit Diagnosis: Other abnormalities of gait and mobility  (R26.89);Pain Pain - Right/Left: Left Pain - part of body: Leg     Time: [id number] PT Time Calculation (min) (ACUTE ONLY): 19 min  Charges:  $Gait Training: 8-22 mins                     Earney Navy, PTA Acute Rehabilitation Services Pager: [phone] Office: [phone]     Darliss Cheney 12/21/2019, 12:37 PM

## 2019-12-21 NOTE — Progress Notes (Addendum)
Progress Note    12/21/2019 8:17 AM 2 Days Post-Op  Subjective:  Says his foot is feeling better.  Walked yesterday.  Says he had a little bit of spitting from the incision.   Afebrile HR 60's-70's NSR [id number] systolic [id number] RA  Vitals:   12/21/19 0406 12/21/19 0810  BP: (!) 141/62 112/69  Pulse: 72 76  Resp: 20 15  Temp: 98 F (36.7 C) 98.1 F (36.7 C)  SpO2: 100% 100%    Physical Exam: Cardiac:  regular Lungs:  Non labored Incisions:  Clean and dry Extremities:  Brisk left PT and peroneal doppler signal; motor in tact; hypersensitive to touch on toes on left foot.   CBC    Component Value Date/Time   WBC 6.8 12/21/2019 0349   RBC 4.15 (L) 12/21/2019 0349   HGB 8.5 (L) 12/21/2019 0349   HGB 9.2 (L) 04/05/2018 1030   HCT 27.7 (L) 12/21/2019 0349   HCT 32.0 (L) 04/05/2018 1030   PLT 148 (L) 12/21/2019 0349   PLT 316 04/05/2018 1030   MCV 66.7 (L) 12/21/2019 0349   MCV 65 (L) 04/05/2018 1030   MCH 20.5 (L) 12/21/2019 0349   MCHC 30.7 12/21/2019 0349   RDW 28.1 (H) 12/21/2019 0349   RDW 30.3 (H) 04/05/2018 1030   LYMPHSABS 1.8 12/19/2019 0935   LYMPHSABS 2.4 03/26/2018 1548   MONOABS 0.7 12/19/2019 0935   EOSABS 0.1 12/19/2019 0935   EOSABS 0.2 03/26/2018 1548   BASOSABS 0.0 12/19/2019 0935   BASOSABS 0.0 03/26/2018 1548    BMET    Component Value Date/Time   NA 132 (L) 12/20/2019 0302   K 4.5 12/20/2019 0302   CL 100 12/20/2019 0302   CO2 23 12/20/2019 0302   GLUCOSE 116 (H) 12/20/2019 0302   BUN 14 12/20/2019 0302   CREATININE 0.95 12/20/2019 0302   CREATININE 0.94 03/31/2015 1454   CALCIUM 8.3 (L) 12/20/2019 0302   GFRNONAA >60 12/20/2019 0302   GFRAA >60 12/20/2019 0302    INR    Component Value Date/Time   INR 1.1 12/19/2019 0935     Intake/Output Summary (Last 24 hours) at 12/21/2019 0817 Last data filed at 12/21/2019 0406 Gross per 24 hour  Intake 1830 ml  Output 910 ml  Net 920 ml     Assessment:  68 y.o. male is s/p:   1. Thrombectomy of left femoral toperoneal artery prostheticbypass 2. Thrombectomy of left peroneal artery 3. Bovine pericardial patch angioplasty of distal femoral toperonealbypassanastomosis   2 Days Post-Op  Plan: -pt continues to have brisk doppler signals left peroneal and PT -pt states he had drainage from his incision-I could not express any but discussed when he is not walking, to elevate his leg to help with swelling.  -walked with PT yesterday and they recommended HHPT, RW and 3n1 -acute surgical blood loss anemia improved with 2 units PRBC's. -DVT prophylaxis:  Sq heparin   Leontine Locket, PA-C [facility] [phone] 12/21/2019 8:17 AM  I have seen and evaluated the patient. I agree with the PA note as documented above.  Postop day 2 status post thrombectomy of left fem peroneal bypass that was occluded.  He has very brisk peroneal and posterior tibial signal today.  Calf soft.  Responded to 2 u pRBC transfusion yesterday from acute blood loss anemia.  States his foot feels much better.  He walked yesterday and has been cleared by physical therapy.  We will plan for discharge home today.  Short  interval follow-up 2 to 3 weeks in clinic.  Marty Heck, MD [facility] Office: [phone] Pager: [phone]

## 2019-12-21 NOTE — Progress Notes (Signed)
Discharge instructions given to patient. IV removed, clean and intact. Medications and wound care reviewed. All questions answered. Pt escorted home by brother.   Arletta Bale, RN

## 2019-12-21 NOTE — Discharge Summary (Signed)
Discharge Summary    [name] November 27, 1951 68 y.o. male  [id number]  Admission Date: 12/19/2019  Discharge Date: 12/21/2019  Physician: Marty Heck, MD  Admission Diagnosis: Ischemia of extremity [I99.8]   HPI:   This is a 68 y.o. male well-known to the vascular surgery service.  He previously had a left femoral to below-knee popliteal bypass that occluded in 2017.  He was subsequently treated with a fem peroneal bypass with prosthetic in 2017 by Dr. Scot Dock.  He has been having pain in his left foot since November for at least the last 3 to 4 weeks.  Ultimately presented for outpatient imaging today and was found to have no flow at the foot.  His bypass was also noted to be occluded.  He presents today for thrombectomy after risks and benefits discussed.  Hospital Course:  The patient was admitted to the hospital and taken to the operating room on 12/19/2019 and underwent: 1.  Thrombectomy of left femoral to peroneal artery prosthetic bypass 2.  Thrombectomy of left peroneal artery 3.  Bovine pericardial patch angioplasty of distal femoral to peroneal bypass anastomosis  Findings: There was no flow in the bypass.  Ultimately the distal bypass was opened onto the peroneal artery where there was subacute to chronic appearing thrombus.  Once this was all retrieved there was backbleeding from the peroneal artery.  We were able to get inflow again using a #4 [place] in the fem peroneal bypass where there was subacute thrombus retrieved.  A bovine pericardial patch was sewn on the distal femoral peroneal bypass anastmosis.  Patient had a pulse in the bypass and a excellent peroneal signal at completion of the case.  The pt tolerated the procedure well and was transported to the PACU in good condition.   POD 1, pt was doing well with brisk left PT and peroneal doppler signals.  His pre operative pain was much improved.  He did have acute surgical blood loss anemia and was  transfused 2 units of PRBC's.    By POD 2, his hgb had improved with transfusion.  He had ambulated. PT worked with pt and recommended HHPT and RW and 3n1, which were ordered.  Case management consulted for these needs.    The remainder of the hospital course consisted of increasing mobilization and increasing intake of solids without difficulty.  CBC    Component Value Date/Time   WBC 6.8 12/21/2019 0349   RBC 4.15 (L) 12/21/2019 0349   HGB 8.5 (L) 12/21/2019 0349   HGB 9.2 (L) 04/05/2018 1030   HCT 27.7 (L) 12/21/2019 0349   HCT 32.0 (L) 04/05/2018 1030   PLT 148 (L) 12/21/2019 0349   PLT 316 04/05/2018 1030   MCV 66.7 (L) 12/21/2019 0349   MCV 65 (L) 04/05/2018 1030   MCH 20.5 (L) 12/21/2019 0349   MCHC 30.7 12/21/2019 0349   RDW 28.1 (H) 12/21/2019 0349   RDW 30.3 (H) 04/05/2018 1030   LYMPHSABS 1.8 12/19/2019 0935   LYMPHSABS 2.4 03/26/2018 1548   MONOABS 0.7 12/19/2019 0935   EOSABS 0.1 12/19/2019 0935   EOSABS 0.2 03/26/2018 1548   BASOSABS 0.0 12/19/2019 0935   BASOSABS 0.0 03/26/2018 1548    BMET    Component Value Date/Time   NA 132 (L) 12/20/2019 0302   K 4.5 12/20/2019 0302   CL 100 12/20/2019 0302   CO2 23 12/20/2019 0302   GLUCOSE 116 (H) 12/20/2019 0302   BUN 14 12/20/2019 0302   CREATININE  0.95 12/20/2019 0302   CREATININE 0.94 03/31/2015 1454   CALCIUM 8.3 (L) 12/20/2019 0302   GFRNONAA >60 12/20/2019 0302   GFRAA >60 12/20/2019 0302      Discharge Instructions    Discharge patient   Complete by: As directed    Discharge home after [facility] needs have been arranged.   Discharge disposition: 01-Home or Self Care   Discharge patient date: 12/21/2019      Discharge Diagnosis:  Ischemia of extremity [I99.8]  Secondary Diagnosis: Patient Active Problem List   Diagnosis Date Noted  . Ischemia of extremity 12/19/2019  . Ischemia of foot 12/11/2019  . Acute pain of left shoulder 11/18/2019  . Lung nodule < 6cm on CT 01/28/2019  . Type 2 diabetes  with complication ([place]) [id number]  . Radiculopathy, cervical region 02/11/2017  . Depression 10/03/2016  . Vision changes 02/02/2016  . Claudication of both lower extremities ([place]) 07/15/2015  . Proteinuria 08/27/2014  . Diabetic neuropathy ([place]) 02/27/2014  . Iron deficiency anemia 08/01/2012  . OBESITY, NOS 02/22/2007  . Tobacco abuse 02/22/2007  . HYPERTENSION, BENIGN SYSTEMIC 02/22/2007  . PAD (peripheral artery disease) ([place]) 02/22/2007  . Osteoarthritis 02/22/2007   Past Medical History:  Diagnosis Date  . Anemia   . Arthritis    OA  . Cervical radiculopathy    Dr. Vertell Limber neurosurgery  . Chronic lower back pain   . Diabetes mellitus    takes Metformin daily  . GERD (gastroesophageal reflux disease)    takes Protonix daily  . History of blood transfusion    "related to low HgB" ((09/10/2015  . Hyperlipidemia    takes Vytorin daily  . Hypertension    takes Benazepril and Bystolic daily  . PAD (peripheral artery disease) ([place])   . Pneumonia   . Shortness of breath dyspnea    with exertion  . Tobacco user   . Toe fracture, right 05/09/2011     Allergies as of 12/21/2019      Reactions   Glipizide Other (See Comments)   REACTION IS SIDE EFFECT Severe hypoglycemia to 40s.       Medication List    TAKE these medications   atorvastatin 40 MG tablet Commonly known as: LIPITOR TAKE 1 TABLET BY MOUTH DAILY   benazepril 5 MG tablet Commonly known as: LOTENSIN TAKE 1 TABLET BY MOUTH EVERY DAY   Bystolic 20 MG Tabs Generic drug: Nebivolol HCl TAKE 1 TABLET BY MOUTH EVERY DAY What changed: how much to take   clopidogrel 75 MG tablet Commonly known as: PLAVIX Take 1 tablet (75 mg total) by mouth daily.   [facility] Acetaminophen Ex St 500 MG tablet Generic drug: acetaminophen TAKE 2 TABLETS (1,000 MG TOTAL) BY MOUTH EVERY 8 (EIGHT) HOURS AS NEEDED FOR MODERATE PAIN. What changed: See the new instructions.   ezetimibe 10 MG tablet Commonly known as: ZETIA TAKE 1  TABLET BY MOUTH ONCE DAILY   FLUoxetine 10 MG capsule Commonly known as: PROZAC TAKE 1 CAPSULE BY MOUTH ONCE DAILY   gabapentin 100 MG capsule Commonly known as: NEURONTIN TAKE 1 CAPSULE (100 MG TOTAL) BY MOUTH AT BEDTIME. What changed: See the new instructions.   metFORMIN 1000 MG tablet Commonly known as: GLUCOPHAGE TAKE ONE (1) TABLET BY MOUTH TWICE DAILY What changed: See the new instructions.   oxyCODONE 5 MG immediate release tablet Commonly known as: Roxicodone Take 1 tablet (5 mg total) by mouth every 4 (four) hours as needed for up to 3 days for severe  pain.   pantoprazole 40 MG tablet Commonly known as: PROTONIX TAKE ONE (1) TABLET BY MOUTH ONCE DAILY What changed: See the new instructions.   ranitidine 75 MG tablet Commonly known as: Zantac 75 Take 1 tablet (75 mg total) by mouth 2 (two) times daily as needed for heartburn (heartburn, acid reflux).            Durable Medical Equipment  (From admission, onward)         Start     Ordered   12/21/19 0841  For home use only DME 3 n 1  Once     12/21/19 0840   12/21/19 0840  DME Walker rolling  Once    Comments: S/p: 1. Thrombectomy of left femoral toperoneal artery prostheticbypass 2. Thrombectomy of left peroneal artery 3. Bovine pericardial patch angioplasty of distal femoral toperonealbypassanastomosis  Question:  Patient needs a walker to treat with the following condition  Answer:  Ischemia of extremity   12/21/19 0840          Prescriptions given: No pain medication prescribed as he has had 2 oxycodone rx this month.   Instructions:   [facility]  Discharge instructions  Lower Extremity Bypass Surgery  Please refer to the following instruction for your post-procedure care. Your surgeon or physician assistant will discuss any changes with you.  Activity  You are encouraged to walk as much as you can. You can slowly return to normal activities  during the month after your surgery. Avoid strenuous activity and heavy lifting until your doctor tells you it's OK. Avoid activities such as vacuuming or swinging a golf club. Do not drive until your doctor give the OK and you are no longer taking prescription pain medications. It is also normal to have difficulty with sleep habits, eating and bowel movement after surgery. These will go away with time.  Bathing/Showering  Shower daily after you go home. Do not soak in a bathtub, hot tub, or swim until the incision heals completely.  Incision Care  Clean your incision with mild soap and water. Shower every day. Pat the area dry with a clean towel. You do not need a bandage unless otherwise instructed. Do not apply any ointments or creams to your incision. If you have open wounds you will be instructed how to care for them or a visiting nurse may be arranged for you. If you have staples or sutures along your incision they will be removed at your post-op appointment. You may have skin glue on your incision. Do not peel it off. It will come off on its own in about one week.  Wash the groin wound with soap and water daily and pat dry. (No tub bath-only shower)  Then put a dry gauze or washcloth in the groin to keep this area dry to help prevent wound infection.  Do this daily and as needed.  Do not use Vaseline or neosporin on your incisions.  Only use soap and water on your incisions and then protect and keep dry.  Diet  Resume your normal diet. There are no special food restrictions following this procedure. A low fat/ low cholesterol diet is recommended for all patients with vascular disease. In order to heal from your surgery, it is CRITICAL to get adequate nutrition. Your body requires vitamins, minerals, and protein. Vegetables are the best source of vitamins and minerals. Vegetables also provide the perfect balance of protein. Processed food has little nutritional value, so try  to avoid  this.  Medications  Resume taking all your medications unless your doctor or physician assistant tells you not to. If your incision is causing pain, you may take over-the-counter pain relievers such as acetaminophen (Tylenol). If you were prescribed a stronger pain medication, please aware these medication can cause nausea and constipation. Prevent nausea by taking the medication with a snack or meal. Avoid constipation by drinking plenty of fluids and eating foods with high amount of fiber, such as fruits, vegetables, and grains. Take Colace 100 mg (an over-the-counter stool softener) twice a day as needed for constipation.  Do not take Tylenol if you are taking prescription pain medications.  Follow Up  Our office will schedule a follow up appointment 2-3 weeks following discharge.  Please call us immediately for any of the following conditions  .Severe or worsening pain in your legs or feet while at rest or while walking .Increase pain, redness, warmth, or drainage (pus) from your incision site(s) Fever of 101 degree or higher The swelling in your leg with the bypass suddenly worsens and becomes more painful than when you were in the hospital If you have been instructed to feel your graft pulse then you should do so every day. If you can no longer feel this pulse, call the office immediately. Not all patients are given this instruction.  Leg swelling is common after leg bypass surgery.  The swelling should improve over a few months following surgery. To improve the swelling, you may elevate your legs above the level of your heart while you are sitting or resting. Your surgeon or physician assistant may ask you to apply an ACE wrap or wear compression (TED) stockings to help to reduce swelling.  Reduce your risk of vascular disease  Stop smoking. If you would like help call [facility] at 1-800-QUIT-NOW 269-070-5572) or [place] at (757)677-6512.  Manage your cholesterol Maintain a  desired weight Control your diabetes weight Control your diabetes Keep your blood pressure down  If you have any questions, please call the office at [phone]   Disposition: home  Patient's condition: is Good  Follow up: 1. Dr. Carlis Abbott in 2-3 weeks   Leontine Locket, PA-C [facility] [phone] 12/21/2019  10:01 AM

## 2019-12-21 NOTE — Evaluation (Signed)
Occupational Therapy Evaluation Patient Details Name: [name] MRN: [id number] DOB: 1951-03-13 Today's Date: 12/21/2019    History of Present Illness Pt is a 68 y.o. male admitted 12/19/19 with L foot pain; s/p thrombectomy of left femoral to peroneal artery prosthetic bypass and L peroneal artery 12/24. PMH includes multiple LE vascular interventions, PAD, HTN, LBP, DM, arthritis.   Clinical Impression   Patient evaluated by Occupational Therapy with no further acute OT needs identified. All education has been completed and the patient has no further questions. Pt requires min guard assist for ADLs.  All education completed.  See below for any follow-up Occupational Therapy or equipment needs. OT is signing off. Thank you for this referral.      Follow Up Recommendations  No OT follow up;Supervision - Intermittent    Equipment Recommendations  None recommended by OT    Recommendations for Other Services       Precautions / Restrictions Precautions Precautions: Fall Restrictions Weight Bearing Restrictions: No      Mobility Bed Mobility Overal bed mobility: Independent                Transfers Overall transfer level: Needs assistance Equipment used: Rolling walker (2 wheeled) Transfers: Sit to/from Stand Sit to Stand: Min guard         General transfer comment: min guard for safety    Balance Overall balance assessment: Needs assistance   Sitting balance-Leahy Scale: Normal     Standing balance support: Bilateral upper extremity supported;During functional activity Standing balance-Leahy Scale: Poor                             ADL either performed or assessed with clinical judgement   ADL Overall ADL's : Needs assistance/impaired Eating/Feeding: Independent   Grooming: Wash/dry hands;Wash/dry face;Oral care;Brushing hair;Min guard;Standing   Upper Body Bathing: Set up;Sitting   Lower Body Bathing: Min guard;Sit to/from stand    Upper Body Dressing : Set up;Sitting   Lower Body Dressing: Min guard;Sit to/from stand Lower Body Dressing Details (indicate cue type and reason): able to access Lt foot using figure 4 technique  Toilet Transfer: Min guard;Ambulation;Comfort height toilet;BSC Toilet Transfer Details (indicate cue type and reason): discussed short comings of 3in1 commode for male patients and to take urinal to use of needing to urinate  Toileting- Water quality scientist and Hygiene: Min guard;Sit to/from Nurse, children's Details (indicate cue type and reason): Pt with difficulty fully weightbearing through Lt LE.  Discussed use of 3in1 commode in shower and options associated with this.  Functional mobility during ADLs: Min guard;Supervision/safety General ADL Comments: instructed him to have wife assist him with shower transfers to maximize his safety      Vision         Perception     Praxis      Pertinent Vitals/Pain Pain Assessment: Faces Faces Pain Scale: Hurts little more Pain Location: Lt LE  Pain Descriptors / Indicators: Grimacing;Guarding Pain Intervention(s): Monitored during session;Premedicated before session     Hand Dominance Right   Extremity/Trunk Assessment Upper Extremity Assessment Upper Extremity Assessment: Overall WFL for tasks assessed   Lower Extremity Assessment Lower Extremity Assessment: Defer to PT evaluation   Cervical / Trunk Assessment Cervical / Trunk Assessment: Normal   Communication Communication Communication: No difficulties   Cognition Arousal/Alertness: Awake/alert Behavior During Therapy: WFL for tasks assessed/performed Overall Cognitive Status: Within Functional Limits for tasks assessed  General Comments       Exercises     Shoulder Instructions      Home Living Family/patient expects to be discharged to:: Private residence Living Arrangements: Spouse/significant  other Available Help at Discharge: Family;Available 24 hours/day Type of Home: House Home Access: Ramped entrance     Home Layout: One level     Bathroom Shower/Tub: Tub/shower unit;Curtain   Bathroom Toilet: Standard Bathroom Accessibility: Yes   Home Equipment: Cane - single point;Bedside commode;Hand held shower head          Prior Functioning/Environment Level of Independence: Independent with assistive device(s)        Comments: Mod indep with SPC/walking stick        OT Problem List: Decreased activity tolerance;Impaired balance (sitting and/or standing);Pain;Decreased knowledge of use of DME or AE      OT Treatment/Interventions:      OT Goals(Current goals can be found in the care plan section) Acute Rehab OT Goals Patient Stated Goal: to go home today  OT Goal Formulation: All assessment and education complete, DC therapy  OT Frequency:     Barriers to D/C:            Co-evaluation              AM-PAC OT "6 Clicks" Daily Activity     Outcome Measure Help from another person eating meals?: None Help from another person taking care of personal grooming?: A Little Help from another person toileting, which includes using toliet, bedpan, or urinal?: A Little Help from another person bathing (including washing, rinsing, drying)?: A Little Help from another person to put on and taking off regular upper body clothing?: A Little Help from another person to put on and taking off regular lower body clothing?: A Little 6 Click Score: 19   End of Session    Activity Tolerance: Patient tolerated treatment well Patient left: in chair;with call bell/phone within reach  OT Visit Diagnosis: Pain Pain - Right/Left: Left Pain - part of body: Leg                Time: [id number] OT Time Calculation (min): 8 min Charges:  OT General Charges $OT Visit: 1 Visit OT Evaluation $OT Eval Moderate Complexity: 1 Mod  Romy Mcgue C., OTR/L Acute Rehabilitation  Services Pager [phone] Office [phone]   Lucille Passy M 12/21/2019, 2:52 PM

## 2019-12-21 NOTE — TOC Transition Note (Signed)
Transition of Care [facility]) - CM/SW Discharge Note   Patient Details  Name: [name] MRN: [id number] Date of Birth: 10-May-1951  [facility] CM/SW Contact:  Claudie Leach, RN 12/21/2019, 11:00 AM   Clinical Narrative:    Discussed d/c plan with patient.  Patient requested crutches, but is agreeable to RW and 3n1.  Patient has used a [place] agency in the past.  He chooses [name] as the name sounds familiar.    DME RW and 3n1 will be delivered to room prior to dc.    Final next level of care: [place] Barriers to Discharge: No Barriers Identified   Patient Goals and CMS Choice Patient states their goals for this hospitalization and ongoing recovery are:: to get home CMS Medicare.gov Compare Post Acute Care list provided to:: Patient Choice offered to / list presented to : Patient   Discharge Plan and Services                DME Arranged: 3-N-1, Walker rolling DME Agency: [employer] Date DME Agency Contacted: 12/21/19 Time DME Agency Contacted: [phone] Representative spoke with at DME Agency: [place]: PT [place]: [place] ([place]) Date [place]: 12/21/19 Time [place]: 1059 Representative spoke with at [place]: Corene Cornea

## 2019-12-24 DIAGNOSIS — Z7984 Long term (current) use of oral hypoglycemic drugs: Secondary | ICD-10-CM | POA: Diagnosis not present

## 2019-12-24 DIAGNOSIS — E785 Hyperlipidemia, unspecified: Secondary | ICD-10-CM | POA: Diagnosis not present

## 2019-12-24 DIAGNOSIS — Z7982 Long term (current) use of aspirin: Secondary | ICD-10-CM | POA: Diagnosis not present

## 2019-12-24 DIAGNOSIS — M5412 Radiculopathy, cervical region: Secondary | ICD-10-CM | POA: Diagnosis not present

## 2019-12-24 DIAGNOSIS — K219 Gastro-esophageal reflux disease without esophagitis: Secondary | ICD-10-CM | POA: Diagnosis not present

## 2019-12-24 DIAGNOSIS — Z95820 Peripheral vascular angioplasty status with implants and grafts: Secondary | ICD-10-CM | POA: Diagnosis not present

## 2019-12-24 DIAGNOSIS — M1990 Unspecified osteoarthritis, unspecified site: Secondary | ICD-10-CM | POA: Diagnosis not present

## 2019-12-24 DIAGNOSIS — I10 Essential (primary) hypertension: Secondary | ICD-10-CM | POA: Diagnosis not present

## 2019-12-24 DIAGNOSIS — Z79891 Long term (current) use of opiate analgesic: Secondary | ICD-10-CM | POA: Diagnosis not present

## 2019-12-24 DIAGNOSIS — D509 Iron deficiency anemia, unspecified: Secondary | ICD-10-CM | POA: Diagnosis not present

## 2019-12-24 DIAGNOSIS — E1140 Type 2 diabetes mellitus with diabetic neuropathy, unspecified: Secondary | ICD-10-CM | POA: Diagnosis not present

## 2019-12-24 DIAGNOSIS — Z8701 Personal history of pneumonia (recurrent): Secondary | ICD-10-CM | POA: Diagnosis not present

## 2019-12-24 DIAGNOSIS — F329 Major depressive disorder, single episode, unspecified: Secondary | ICD-10-CM | POA: Diagnosis not present

## 2019-12-24 DIAGNOSIS — E1151 Type 2 diabetes mellitus with diabetic peripheral angiopathy without gangrene: Secondary | ICD-10-CM | POA: Diagnosis not present

## 2019-12-24 DIAGNOSIS — Z48812 Encounter for surgical aftercare following surgery on the circulatory system: Secondary | ICD-10-CM | POA: Diagnosis not present

## 2019-12-24 DIAGNOSIS — Z9181 History of falling: Secondary | ICD-10-CM | POA: Diagnosis not present

## 2019-12-24 DIAGNOSIS — Z7902 Long term (current) use of antithrombotics/antiplatelets: Secondary | ICD-10-CM | POA: Diagnosis not present

## 2019-12-24 DIAGNOSIS — F17210 Nicotine dependence, cigarettes, uncomplicated: Secondary | ICD-10-CM | POA: Diagnosis not present

## 2019-12-24 DIAGNOSIS — H547 Unspecified visual loss: Secondary | ICD-10-CM | POA: Diagnosis not present

## 2019-12-31 ENCOUNTER — Encounter: Payer: Medicare Other | Admitting: Vascular Surgery

## 2019-12-31 DIAGNOSIS — Z48812 Encounter for surgical aftercare following surgery on the circulatory system: Secondary | ICD-10-CM | POA: Diagnosis not present

## 2019-12-31 DIAGNOSIS — E1151 Type 2 diabetes mellitus with diabetic peripheral angiopathy without gangrene: Secondary | ICD-10-CM | POA: Diagnosis not present

## 2019-12-31 DIAGNOSIS — I10 Essential (primary) hypertension: Secondary | ICD-10-CM | POA: Diagnosis not present

## 2019-12-31 DIAGNOSIS — D509 Iron deficiency anemia, unspecified: Secondary | ICD-10-CM | POA: Diagnosis not present

## 2019-12-31 DIAGNOSIS — M1990 Unspecified osteoarthritis, unspecified site: Secondary | ICD-10-CM | POA: Diagnosis not present

## 2019-12-31 DIAGNOSIS — M5412 Radiculopathy, cervical region: Secondary | ICD-10-CM | POA: Diagnosis not present

## 2020-01-02 ENCOUNTER — Other Ambulatory Visit: Payer: Self-pay | Admitting: Student in an Organized Health Care Education/Training Program

## 2020-01-02 ENCOUNTER — Telehealth: Payer: Self-pay

## 2020-01-02 DIAGNOSIS — I10 Essential (primary) hypertension: Secondary | ICD-10-CM

## 2020-01-02 NOTE — Telephone Encounter (Signed)
Wife called and said that he is having a lot of pain. She said that they are out of pain meds and she needs help.   Tried to call wife but there was no answer and unable to leave message   [place] Cerise, [place]

## 2020-01-03 ENCOUNTER — Telehealth: Payer: Self-pay

## 2020-01-03 NOTE — Telephone Encounter (Signed)
No additional nursing notes.

## 2020-01-08 DIAGNOSIS — M1990 Unspecified osteoarthritis, unspecified site: Secondary | ICD-10-CM | POA: Diagnosis not present

## 2020-01-08 DIAGNOSIS — D509 Iron deficiency anemia, unspecified: Secondary | ICD-10-CM | POA: Diagnosis not present

## 2020-01-08 DIAGNOSIS — I10 Essential (primary) hypertension: Secondary | ICD-10-CM | POA: Diagnosis not present

## 2020-01-08 DIAGNOSIS — M5412 Radiculopathy, cervical region: Secondary | ICD-10-CM | POA: Diagnosis not present

## 2020-01-08 DIAGNOSIS — Z48812 Encounter for surgical aftercare following surgery on the circulatory system: Secondary | ICD-10-CM | POA: Diagnosis not present

## 2020-01-08 DIAGNOSIS — E1151 Type 2 diabetes mellitus with diabetic peripheral angiopathy without gangrene: Secondary | ICD-10-CM | POA: Diagnosis not present

## 2020-01-14 ENCOUNTER — Other Ambulatory Visit: Payer: Self-pay

## 2020-01-14 ENCOUNTER — Ambulatory Visit (INDEPENDENT_AMBULATORY_CARE_PROVIDER_SITE_OTHER): Payer: Self-pay | Admitting: Vascular Surgery

## 2020-01-14 ENCOUNTER — Encounter: Payer: Self-pay | Admitting: Vascular Surgery

## 2020-01-14 VITALS — BP 169/79 | HR 65 | Temp 97.7°F | Resp 16 | Ht 67.0 in | Wt 182.0 lb

## 2020-01-14 DIAGNOSIS — I739 Peripheral vascular disease, unspecified: Secondary | ICD-10-CM

## 2020-01-14 MED ORDER — TRAMADOL HCL 50 MG PO TABS: 50.0000 mg | ORAL_TABLET | Freq: Four times a day (QID) | ORAL | 0 refills | Status: DC | PRN

## 2020-01-14 NOTE — Progress Notes (Signed)
Patient name: [name] MRN: [id number] DOB: 21-Aug-1951 Sex: male  REASON FOR VISIT: Postop check after left fem peroneal graft thrombectomy  HPI: [name] is a 69 y.o. male with multiple medical problems that presents for postop check after left femoral peroneal artery prosthetic bypass thrombectomy 12/19/2019.  Patient initially presented with severe rest pain in the left foot and had no documented blood flow at the foot.  Ultimately was seen in the emergency room and taken emergently for bypass thrombectomy.  This was previously performed by Dr. Scot Dock.  He also had stenting of his right external iliac artery with a VBX in 2018.  Overall foot is doing much better.  Still having a fair amount of incisional pain.  No tissue loss at this time.  Remains on aspirin and plavix.   Past Medical History:  Diagnosis Date  . Anemia   . Arthritis    OA  . Cervical radiculopathy    Dr. Vertell Limber neurosurgery  . Chronic lower back pain   . Diabetes mellitus    takes Metformin daily  . GERD (gastroesophageal reflux disease)    takes Protonix daily  . History of blood transfusion    "related to low HgB" ((09/10/2015  . Hyperlipidemia    takes Vytorin daily  . Hypertension    takes Benazepril and Bystolic daily  . PAD (peripheral artery disease) ([place])   . Pneumonia   . Shortness of breath dyspnea    with exertion  . Tobacco user   . Toe fracture, right 05/09/2011    Past Surgical History:  Procedure Laterality Date  . ABDOMINAL AORTOGRAM W/LOWER EXTREMITY N/A 12/11/2017   Procedure: ABDOMINAL AORTOGRAM W/LOWER EXTREMITY;  Surgeon: Angelia Mould, MD;  Location: [facility];  Service: Cardiovascular;  Laterality: N/A;  . ANTERIOR CERVICAL DECOMP/DISCECTOMY FUSION  03/08/12   C6-7  . ANTERIOR CERVICAL DECOMP/DISCECTOMY FUSION  03/08/2012   Procedure: ANTERIOR CERVICAL DECOMPRESSION/DISCECTOMY FUSION 1 LEVEL/HARDWARE REMOVAL;  Surgeon: Erline Levine, MD;  Location: [facility];  Service: Neurosurgery;  Laterality: N/A;  revison of C5-7 anterior cervical decompression with fusion with Cervical Five-Thoracic One anterior cervical decompression with fusion with interbody prothesis plating and bonegraft  . BACK SURGERY  1996  . BYPASS GRAFT FEMORAL-PERONEAL Left 11/11/2016   Procedure: REDO LEFT FEMORAL-PERONEAL BYPASS WITH PROPATEN 6MM X 80CM GRAFT;  Surgeon: Angelia Mould, MD;  Location: [facility] OR;  Service: Vascular;  Laterality: Left;  . COLONOSCOPY W/ BIOPSIES AND POLYPECTOMY  08/17/2012   f/u 5 years, 4 polyps, no high grade dysplasia or malignancy, tubular adenoma, hyperplastic polyops  . EMBOLECTOMY Left 12/19/2019   Procedure: LEFT FEMERAL- PERONEAL THROMBECTOMY;  Surgeon: Marty Heck, MD;  Location: [place];  Service: Vascular;  Laterality: Left;  . ENTEROSCOPY N/A 12/11/2015   Procedure: ENTEROSCOPY;  Surgeon: Carol Ada, MD;  Location: [facility] ENDOSCOPY;  Service: Endoscopy;  Laterality: N/A;  . ENTEROSCOPY N/A 03/29/2018   Procedure: ENTEROSCOPY;  Surgeon: Carol Ada, MD;  Location: [facility] ENDOSCOPY;  Service: Endoscopy;  Laterality: N/A;  . ESOPHAGOGASTRODUODENOSCOPY  08/17/2012   normal esophagus and GEJ, diffuse gastritis with erythema- no malignancy, reactive gastropathy  with focal intestinal metaplasia  . FEMORAL-POPLITEAL BYPASS GRAFT Left 01/06/2016   Procedure: Left  COMMON FEMORAL-BELOW KNEE POPLITEAL ARTERY Bypass using non-reversed translocated saphenous vein graft from left leg;  Surgeon: Mal Misty, MD;  Location: [place];  Service: Vascular;  Laterality: Left;  . GIVENS CAPSULE STUDY N/A 11/24/2015   Procedure:  GIVENS CAPSULE STUDY;  Surgeon: Juanita Craver, MD;  Location: [place];  Service: Endoscopy;  Laterality: N/A;  . HOT HEMOSTASIS N/A 03/29/2018   Procedure: HOT HEMOSTASIS (ARGON PLASMA COAGULATION/BICAP);  Surgeon: Carol Ada, MD;  Location: [place];  Service: Endoscopy;  Laterality: N/A;  . INGUINAL HERNIA REPAIR  1990's    right  . INTRAOPERATIVE ARTERIOGRAM Left 01/06/2016   Procedure: INTRA OPERATIVE ARTERIOGRAM LEFT LOWER LEG;  Surgeon: Mal Misty, MD;  Location: [place];  Service: Vascular;  Laterality: Left;  . INTRAOPERATIVE ARTERIOGRAM Left 11/11/2016   Procedure: INTRA OPERATIVE ARTERIOGRAM LEFT LOWER EXTRIMITY;  Surgeon: Angelia Mould, MD;  Location: [place];  Service: Vascular;  Laterality: Left;  . IR ANGIOGRAM FOLLOW UP STUDY  12/11/2017  . IR GENERIC HISTORICAL  10/24/2016   IR ANGIOGRAM FOLLOW UP STUDY  . LOWER EXTREMITY ANGIOGRAM Bilateral 07/30/2015   Procedure: Lower Extremity Angiogram;  Surgeon: Conrad Hudson, MD;  Location: [place] [facility];  Service: Cardiovascular;  Laterality: Bilateral;  . [place]   "lower"  . PATCH ANGIOPLASTY Left 12/19/2019   Procedure: LEFT FEMORAL -PERONEAL PATCH ANGIOPLASTY WITH XENOSURE BIOLOGIC PATCH  ;  Surgeon: Marty Heck, MD;  Location: [place];  Service: Vascular;  Laterality: Left;  . PERIPHERAL VASCULAR CATHETERIZATION N/A 07/30/2015   Procedure: Abdominal Aortogram;  Surgeon: Conrad , MD;  Location: [place] [facility];  Service: Cardiovascular;  Laterality: N/A;  . PERIPHERAL VASCULAR CATHETERIZATION N/A 10/24/2016   Procedure: Abdominal Aortogram;  Surgeon: Angelia Mould, MD;  Location: [place] [facility];  Service: Cardiovascular;  Laterality: N/A;  . PERIPHERAL VASCULAR CATHETERIZATION N/A 10/24/2016   Procedure: Lower Extremity Angiography;  Surgeon: Angelia Mould, MD;  Location: [place] [facility];  Service: Cardiovascular;  Laterality: N/A;  . PERIPHERAL VASCULAR INTERVENTION Right 12/11/2017   Procedure: PERIPHERAL VASCULAR INTERVENTION;  Surgeon: Angelia Mould, MD;  Location: [place] [facility];  Service: Cardiovascular;  Laterality: Right;  Marland Kitchen VASCULAR SURGERY  ~ 2007   Stent SFA   . VEIN HARVEST Left 01/06/2016   Procedure: LEFT GREATER [place];  Surgeon: Mal Misty, MD;  Location: [facility] OR;  Service: Vascular;  Laterality: Left;    Family History  Problem Relation Age of Onset  . Cancer Mother        colon Cancer  . Hyperlipidemia Mother   . Diabetes Mother   . Heart disease Father   . Hypertension Father   . Cancer Sister        Uterine  . Diabetes Sister   . Diabetes Brother     SOCIAL HISTORY: Social History   Tobacco Use  . Smoking status: Current Some Day Smoker    Years: 24.00    Types: Cigars  . Smokeless tobacco: Never Used  . Tobacco comment: smokes 1 cigar a day  Substance Use Topics  . Alcohol use: Yes    Alcohol/week: 2.0 standard drinks    Types: 2 Cans of beer per week    Allergies  Allergen Reactions  . Glipizide Other (See Comments)    REACTION IS SIDE EFFECT Severe hypoglycemia to 40s.     Current Outpatient Medications  Medication Sig Dispense Refill  . aspirin EC 81 MG EC tablet Take 1 tablet (81 mg total) by mouth daily.    Marland Kitchen atorvastatin (LIPITOR) 40 MG tablet TAKE 1 TABLET BY MOUTH DAILY 90 tablet 2  . benazepril (LOTENSIN) 10 MG tablet  TAKE 1 TABLET BY MOUTH EVERY DAY 90 tablet 1  . benazepril (LOTENSIN) 5 MG tablet TAKE 1 TABLET BY MOUTH EVERY DAY (Patient taking differently: Take 5 mg by mouth daily. ) 90 tablet 1  . BYSTOLIC 20 MG TABS TAKE 1 TABLET BY MOUTH EVERY DAY (Patient taking differently: Take 20 mg by mouth daily. ) 90 tablet 1  . clopidogrel (PLAVIX) 75 MG tablet Take 1 tablet (75 mg total) by mouth daily. 90 tablet 1  . [employer] ACETAMINOPHEN EX ST 500 MG tablet TAKE 2 TABLETS (1,000 MG TOTAL) BY MOUTH EVERY 8 (EIGHT) HOURS AS NEEDED FOR MODERATE PAIN. (Patient taking differently: Take 1,000 mg by mouth every 8 (eight) hours as needed for moderate pain. ) 90 tablet 0  . ezetimibe (ZETIA) 10 MG tablet TAKE 1 TABLET BY MOUTH ONCE DAILY 90 tablet 1  . FLUoxetine (PROZAC) 10 MG capsule TAKE 1 CAPSULE BY MOUTH ONCE DAILY (Patient taking differently: Take 10 mg by mouth daily. ) 90 capsule 2  .  gabapentin (NEURONTIN) 100 MG capsule TAKE 1 CAPSULE (100 MG TOTAL) BY MOUTH AT BEDTIME. (Patient taking differently: Take 100 mg by mouth at bedtime. ) 90 capsule 1  . metFORMIN (GLUCOPHAGE) 1000 MG tablet TAKE ONE (1) TABLET BY MOUTH TWICE DAILY (Patient taking differently: Take 1,000 mg by mouth 2 (two) times daily with a meal. ) 180 tablet 1  . pantoprazole (PROTONIX) 40 MG tablet TAKE ONE (1) TABLET BY MOUTH ONCE DAILY (Patient taking differently: Take 40 mg by mouth daily. ) 90 tablet 0  . ranitidine (ZANTAC 75) 75 MG tablet Take 1 tablet (75 mg total) by mouth 2 (two) times daily as needed for heartburn (heartburn, acid reflux). 30 tablet 0  . oxyCODONE (OXY IR/ROXICODONE) 5 MG immediate release tablet Take 1 tablet (5 mg total) by mouth every 4 (four) hours as needed for moderate pain. (Patient not taking: Reported on 01/14/2020) 28 tablet 0   No current facility-administered medications for this visit.    REVIEW OF SYSTEMS:  [X]  denotes positive finding, [ ]  denotes negative finding Cardiac  Comments:  Chest pain or chest pressure:    Shortness of breath upon exertion:    Short of breath when lying flat:    Irregular heart rhythm:        Vascular    Pain in calf, thigh, or hip brought on by ambulation:    Pain in feet at night that wakes you up from your sleep:     Blood clot in your veins:    Leg swelling:         Pulmonary    Oxygen at home:    Productive cough:     Wheezing:         Neurologic    Sudden weakness in arms or legs:     Sudden numbness in arms or legs:     Sudden onset of difficulty speaking or slurred speech:    Temporary loss of vision in one eye:     Problems with dizziness:         Gastrointestinal    Blood in stool:     Vomited blood:         Genitourinary    Burning when urinating:     Blood in urine:        Psychiatric    Major depression:         Hematologic    Bleeding problems:    Problems with blood clotting too  easily:        Skin     Rashes or ulcers:        Constitutional    Fever or chills:      PHYSICAL EXAM: Vitals:   01/14/20 0905  BP: (!) 169/79  Pulse: 65  Resp: 16  Temp: 97.7 F (36.5 C)  TempSrc: Temporal  SpO2: 100%  Weight: 182 lb (82.6 kg)  Height: 5\' 7"  (1.702 m)    GENERAL: The patient is a well-nourished male, in no acute distress. The vital signs are documented above. CARDIAC: There is a regular rate and rhythm.  VASCULAR:  Left calf incision well-healed. Left peroneal and posterior tibial signal brisk by Doppler. No tissue loss in the left foot.  DATA:   None  Assessment/Plan:  69 year old male status post left common femoral to peroneal prosthetic bypass thrombectomy after presenting with acute on chronic limb ischemia on 12/19/2019.  His left calf incision has healed nicely.  He has very brisk peroneal and posterior tibial signal at the left ankle.  I did send some tramadol to his pharmacy for ongoing post-op incisional pain.  Discussed that if there is ongoing issues would likely have to get him to a pain clinic.  Ultimately I will bring him back in 3 months with aortoiliac duplex to look at his right iliac stent as well as left lower extremity arterial duplex to look at his bypass graft.  Discussed that he continue aspirin Plavix.  Call with questions or concerns.   Marty Heck, MD [facility] Office: [phone]

## 2020-01-15 ENCOUNTER — Other Ambulatory Visit: Payer: Self-pay | Admitting: *Deleted

## 2020-01-15 DIAGNOSIS — I739 Peripheral vascular disease, unspecified: Secondary | ICD-10-CM

## 2020-01-15 DIAGNOSIS — Z48812 Encounter for surgical aftercare following surgery on the circulatory system: Secondary | ICD-10-CM | POA: Diagnosis not present

## 2020-01-15 DIAGNOSIS — M5412 Radiculopathy, cervical region: Secondary | ICD-10-CM | POA: Diagnosis not present

## 2020-01-15 DIAGNOSIS — E1151 Type 2 diabetes mellitus with diabetic peripheral angiopathy without gangrene: Secondary | ICD-10-CM | POA: Diagnosis not present

## 2020-01-15 DIAGNOSIS — I10 Essential (primary) hypertension: Secondary | ICD-10-CM | POA: Diagnosis not present

## 2020-01-15 DIAGNOSIS — M1990 Unspecified osteoarthritis, unspecified site: Secondary | ICD-10-CM | POA: Diagnosis not present

## 2020-01-15 DIAGNOSIS — D509 Iron deficiency anemia, unspecified: Secondary | ICD-10-CM | POA: Diagnosis not present

## 2020-01-16 ENCOUNTER — Other Ambulatory Visit: Payer: Self-pay | Admitting: Student in an Organized Health Care Education/Training Program

## 2020-01-16 DIAGNOSIS — M5412 Radiculopathy, cervical region: Secondary | ICD-10-CM

## 2020-01-23 DIAGNOSIS — E785 Hyperlipidemia, unspecified: Secondary | ICD-10-CM | POA: Diagnosis not present

## 2020-01-23 DIAGNOSIS — Z8701 Personal history of pneumonia (recurrent): Secondary | ICD-10-CM | POA: Diagnosis not present

## 2020-01-23 DIAGNOSIS — K219 Gastro-esophageal reflux disease without esophagitis: Secondary | ICD-10-CM | POA: Diagnosis not present

## 2020-01-23 DIAGNOSIS — Z7982 Long term (current) use of aspirin: Secondary | ICD-10-CM | POA: Diagnosis not present

## 2020-01-23 DIAGNOSIS — Z7902 Long term (current) use of antithrombotics/antiplatelets: Secondary | ICD-10-CM | POA: Diagnosis not present

## 2020-01-23 DIAGNOSIS — D509 Iron deficiency anemia, unspecified: Secondary | ICD-10-CM | POA: Diagnosis not present

## 2020-01-23 DIAGNOSIS — M1990 Unspecified osteoarthritis, unspecified site: Secondary | ICD-10-CM | POA: Diagnosis not present

## 2020-01-23 DIAGNOSIS — Z95820 Peripheral vascular angioplasty status with implants and grafts: Secondary | ICD-10-CM | POA: Diagnosis not present

## 2020-01-23 DIAGNOSIS — E1151 Type 2 diabetes mellitus with diabetic peripheral angiopathy without gangrene: Secondary | ICD-10-CM | POA: Diagnosis not present

## 2020-01-23 DIAGNOSIS — F329 Major depressive disorder, single episode, unspecified: Secondary | ICD-10-CM | POA: Diagnosis not present

## 2020-01-23 DIAGNOSIS — E1140 Type 2 diabetes mellitus with diabetic neuropathy, unspecified: Secondary | ICD-10-CM | POA: Diagnosis not present

## 2020-01-23 DIAGNOSIS — M5412 Radiculopathy, cervical region: Secondary | ICD-10-CM | POA: Diagnosis not present

## 2020-01-23 DIAGNOSIS — Z9181 History of falling: Secondary | ICD-10-CM | POA: Diagnosis not present

## 2020-01-23 DIAGNOSIS — F17210 Nicotine dependence, cigarettes, uncomplicated: Secondary | ICD-10-CM | POA: Diagnosis not present

## 2020-01-23 DIAGNOSIS — H547 Unspecified visual loss: Secondary | ICD-10-CM | POA: Diagnosis not present

## 2020-01-23 DIAGNOSIS — Z7984 Long term (current) use of oral hypoglycemic drugs: Secondary | ICD-10-CM | POA: Diagnosis not present

## 2020-01-23 DIAGNOSIS — Z79891 Long term (current) use of opiate analgesic: Secondary | ICD-10-CM | POA: Diagnosis not present

## 2020-01-23 DIAGNOSIS — I10 Essential (primary) hypertension: Secondary | ICD-10-CM | POA: Diagnosis not present

## 2020-01-23 DIAGNOSIS — Z48812 Encounter for surgical aftercare following surgery on the circulatory system: Secondary | ICD-10-CM | POA: Diagnosis not present

## 2020-01-29 DIAGNOSIS — D509 Iron deficiency anemia, unspecified: Secondary | ICD-10-CM | POA: Diagnosis not present

## 2020-01-29 DIAGNOSIS — I10 Essential (primary) hypertension: Secondary | ICD-10-CM | POA: Diagnosis not present

## 2020-01-29 DIAGNOSIS — M5412 Radiculopathy, cervical region: Secondary | ICD-10-CM | POA: Diagnosis not present

## 2020-01-29 DIAGNOSIS — M1990 Unspecified osteoarthritis, unspecified site: Secondary | ICD-10-CM | POA: Diagnosis not present

## 2020-01-29 DIAGNOSIS — Z48812 Encounter for surgical aftercare following surgery on the circulatory system: Secondary | ICD-10-CM | POA: Diagnosis not present

## 2020-01-29 DIAGNOSIS — E1151 Type 2 diabetes mellitus with diabetic peripheral angiopathy without gangrene: Secondary | ICD-10-CM | POA: Diagnosis not present

## 2020-02-11 DIAGNOSIS — Z48812 Encounter for surgical aftercare following surgery on the circulatory system: Secondary | ICD-10-CM | POA: Diagnosis not present

## 2020-02-11 DIAGNOSIS — D509 Iron deficiency anemia, unspecified: Secondary | ICD-10-CM | POA: Diagnosis not present

## 2020-02-11 DIAGNOSIS — M1990 Unspecified osteoarthritis, unspecified site: Secondary | ICD-10-CM | POA: Diagnosis not present

## 2020-02-11 DIAGNOSIS — E1151 Type 2 diabetes mellitus with diabetic peripheral angiopathy without gangrene: Secondary | ICD-10-CM | POA: Diagnosis not present

## 2020-02-11 DIAGNOSIS — I10 Essential (primary) hypertension: Secondary | ICD-10-CM | POA: Diagnosis not present

## 2020-02-11 DIAGNOSIS — M5412 Radiculopathy, cervical region: Secondary | ICD-10-CM | POA: Diagnosis not present

## 2020-02-14 ENCOUNTER — Other Ambulatory Visit: Payer: Self-pay | Admitting: Student in an Organized Health Care Education/Training Program

## 2020-03-09 ENCOUNTER — Other Ambulatory Visit: Payer: Self-pay

## 2020-03-09 ENCOUNTER — Ambulatory Visit (INDEPENDENT_AMBULATORY_CARE_PROVIDER_SITE_OTHER): Payer: Medicare Other | Admitting: Student in an Organized Health Care Education/Training Program

## 2020-03-09 ENCOUNTER — Encounter: Payer: Self-pay | Admitting: Student in an Organized Health Care Education/Training Program

## 2020-03-09 VITALS — BP 138/80 | HR 79 | Ht 67.0 in | Wt 184.0 lb

## 2020-03-09 DIAGNOSIS — M7502 Adhesive capsulitis of left shoulder: Secondary | ICD-10-CM

## 2020-03-09 DIAGNOSIS — E118 Type 2 diabetes mellitus with unspecified complications: Secondary | ICD-10-CM | POA: Diagnosis not present

## 2020-03-09 LAB — POCT GLYCOSYLATED HEMOGLOBIN (HGB A1C): HbA1c, POC (controlled diabetic range): 6.3 % (ref 0.0–7.0)

## 2020-03-09 MED ORDER — GABAPENTIN 300 MG PO CAPS: 300.0000 mg | ORAL_CAPSULE | Freq: Every day | ORAL | 0 refills | Status: DC

## 2020-03-09 NOTE — Patient Instructions (Signed)
It was a pleasure to see you today!  To summarize our discussion for this visit:  You have a frozen shoulder which can be very painful and take a long time to heal. To help it get better sooner, we can try some exercises and inject it with steroids to decrease inflammation and loosen the joint.   Some additional health maintenance measures we should update are: Health Maintenance Due  Topic Date Due  . COLONOSCOPY  12/27/2019  . FOOT EXAM  01/19/2020  .    Please return to our clinic to see me for your next appointment.  Call the clinic at [phone] if your symptoms worsen or you have any concerns.   Thank you for allowing me to take part in your care,  Dr. Doristine Mango   Adhesive Capsulitis  Adhesive capsulitis, also called frozen shoulder, causes the shoulder to become stiff and painful to move. This condition happens when there is inflammation of the tendons and ligaments that surround the shoulder joint (shoulder capsule). What are the causes? This condition may be caused by:  An injury to your shoulder joint.  Straining your shoulder.  Not moving your shoulder for a period of time. This can happen if your arm was injured or in a sling.  Long-standing conditions, such as: ? Diabetes. ? Thyroid problems. ? Heart disease. ? Stroke. ? Rheumatoid arthritis. ? Lung disease. In some cases, the cause is not known. What increases the risk? You are more likely to develop this condition if you are:  A woman.  Older than 70 years of age. What are the signs or symptoms? Symptoms of this condition include:  Pain in your shoulder when you move your arm. There may also be pain when parts of your shoulder are touched. The pain may be worse at night or when you are resting.  A sore or aching shoulder.  The inability to move your shoulder normally.  Muscle spasms. How is this diagnosed? This condition is diagnosed with a physical exam and imaging tests, such as an  X-ray or MRI. How is this treated? This condition may be treated with:  Treatment of the underlying cause or condition.  Medicine. Medicine may be given to relieve pain, inflammation, or muscle spasms.  Steroid injections into the shoulder joint.  Physical therapy. This involves performing exercises to get the shoulder moving again.  Acupuncture. This is a type of treatment that involves stimulating specific points on your body by inserting thin needles through your skin.  Shoulder manipulation. This is a procedure to move the shoulder into another position. It is done after you are given a medicine to make you fall asleep (general anesthetic). The joint may also be injected with salt water at high pressure to break down scarring.  Surgery. This may be done in severe cases when other treatments have failed. Although most people recover completely from adhesive capsulitis, some may not regain full shoulder movement. Follow these instructions at home: Managing pain, stiffness, and swelling      If directed, put ice on the injured area: ? Put ice in a plastic bag. ? Place a towel between your skin and the bag. ? Leave the ice on for 20 minutes, 2-3 times per day.  If directed, apply heat to the affected area before you exercise. Use the heat source that your health care provider recommends, such as a moist heat pack or a heating pad. ? Place a towel between your skin and the heat source. ?  Leave the heat on for 20-30 minutes. ? Remove the heat if your skin turns bright red. This is especially important if you are unable to feel pain, heat, or cold. You may have a greater risk of getting burned. General instructions  Take over-the-counter and prescription medicines only as told by your health care provider.  If you are being treated with physical therapy, follow instructions from your physical therapist.  Avoid exercises that put a lot of demand on your shoulder, such as throwing.  These exercises can make pain worse.  Keep all follow-up visits as told by your health care provider. This is important. Contact a health care provider if:  You develop new symptoms.  Your symptoms get worse. Summary  Adhesive capsulitis, also called frozen shoulder, causes the shoulder to become stiff and painful to move.  You are more likely to have this condition if you are a woman and over age 54.  It is treated with physical therapy, medicines, and sometimes surgery. This information is not intended to replace advice given to you by your health care provider. Make sure you discuss any questions you have with your health care provider. Document Revised: 05/18/2018 Document Reviewed: 05/18/2018 Elsevier Patient Education  [place].   Shoulder Exercises Ask your health care provider which exercises are safe for you. Do exercises exactly as told by your health care provider and adjust them as directed. It is normal to feel mild stretching, pulling, tightness, or discomfort as you do these exercises. Stop right away if you feel sudden pain or your pain gets worse. Do not begin these exercises until told by your health care provider. Stretching exercises External rotation and abduction This exercise is sometimes called corner stretch. This exercise rotates your arm outward (external rotation) and moves your arm out from your body (abduction). 1. Stand in a doorway with one of your feet slightly in front of the other. This is called a staggered stance. If you cannot reach your forearms to the door frame, stand facing a corner of a room. 2. Choose one of the following positions as told by your health care provider: ? Place your hands and forearms on the door frame above your head. ? Place your hands and forearms on the door frame at the height of your head. ? Place your hands on the door frame at the height of your elbows. 3. Slowly move your weight onto your front foot until you  feel a stretch across your chest and in the front of your shoulders. Keep your head and chest upright and keep your abdominal muscles tight. 4. Hold for __________ seconds. 5. To release the stretch, shift your weight to your back foot. Repeat __________ times. Complete this exercise __________ times a day. Extension, standing 1. Stand and hold a broomstick, a cane, or a similar object behind your back. ? Your hands should be a little wider than shoulder width apart. ? Your palms should face away from your back. 2. Keeping your elbows straight and your shoulder muscles relaxed, move the stick away from your body until you feel a stretch in your shoulders (extension). ? Avoid shrugging your shoulders while you move the stick. Keep your shoulder blades tucked down toward the middle of your back. 3. Hold for __________ seconds. 4. Slowly return to the starting position. Repeat __________ times. Complete this exercise __________ times a day. Range-of-motion exercises Pendulum  1. Stand near a wall or a surface that you can hold onto for  balance. 2. Bend at the waist and let your left / right arm hang straight down. Use your other arm to support you. Keep your back straight and do not lock your knees. 3. Relax your left / right arm and shoulder muscles, and move your hips and your trunk so your left / right arm swings freely. Your arm should swing because of the motion of your body, not because you are using your arm or shoulder muscles. 4. Keep moving your hips and trunk so your arm swings in the following directions, as told by your health care provider: ? Side to side. ? Forward and backward. ? In clockwise and counterclockwise circles. 5. Continue each motion for __________ seconds, or for as long as told by your health care provider. 6. Slowly return to the starting position. Repeat __________ times. Complete this exercise __________ times a day. Shoulder flexion, standing  1. Stand and  hold a broomstick, a cane, or a similar object. Place your hands a little more than shoulder width apart on the object. Your left / right hand should be palm up, and your other hand should be palm down. 2. Keep your elbow straight and your shoulder muscles relaxed. Push the stick up with your healthy arm to raise your left / right arm in front of your body, and then over your head until you feel a stretch in your shoulder (flexion). ? Avoid shrugging your shoulder while you raise your arm. Keep your shoulder blade tucked down toward the middle of your back. 3. Hold for __________ seconds. 4. Slowly return to the starting position. Repeat __________ times. Complete this exercise __________ times a day. Shoulder abduction, standing 1. Stand and hold a broomstick, a cane, or a similar object. Place your hands a little more than shoulder width apart on the object. Your left / right hand should be palm up, and your other hand should be palm down. 2. Keep your elbow straight and your shoulder muscles relaxed. Push the object across your body toward your left / right side. Raise your left / right arm to the side of your body (abduction) until you feel a stretch in your shoulder. ? Do not raise your arm above shoulder height unless your health care provider tells you to do that. ? If directed, raise your arm over your head. ? Avoid shrugging your shoulder while you raise your arm. Keep your shoulder blade tucked down toward the middle of your back. 3. Hold for __________ seconds. 4. Slowly return to the starting position. Repeat __________ times. Complete this exercise __________ times a day. Internal rotation  1. Place your left / right hand behind your back, palm up. 2. Use your other hand to dangle an exercise band, a towel, or a similar object over your shoulder. Grasp the band with your left / right hand so you are holding on to both ends. 3. Gently pull up on the band until you feel a stretch in the  front of your left / right shoulder. The movement of your arm toward the center of your body is called internal rotation. ? Avoid shrugging your shoulder while you raise your arm. Keep your shoulder blade tucked down toward the middle of your back. 4. Hold for __________ seconds. 5. Release the stretch by letting go of the band and lowering your hands. Repeat __________ times. Complete this exercise __________ times a day. Strengthening exercises External rotation  1. Sit in a stable chair without armrests. 2. Secure an  exercise band to a stable object at elbow height on your left / right side. 3. Place a soft object, such as a folded towel or a small pillow, between your left / right upper arm and your body to move your elbow about 4 inches (10 cm) away from your side. 4. Hold the end of the exercise band so it is tight and there is no slack. 5. Keeping your elbow pressed against the soft object, slowly move your forearm out, away from your abdomen (external rotation). Keep your body steady so only your forearm moves. 6. Hold for __________ seconds. 7. Slowly return to the starting position. Repeat __________ times. Complete this exercise __________ times a day. Shoulder abduction  1. Sit in a stable chair without armrests, or stand up. 2. Hold a __________ weight in your left / right hand, or hold an exercise band with both hands. 3. Start with your arms straight down and your left / right palm facing in, toward your body. 4. Slowly lift your left / right hand out to your side (abduction). Do not lift your hand above shoulder height unless your health care provider tells you that this is safe. ? Keep your arms straight. ? Avoid shrugging your shoulder while you do this movement. Keep your shoulder blade tucked down toward the middle of your back. 5. Hold for __________ seconds. 6. Slowly lower your arm, and return to the starting position. Repeat __________ times. Complete this exercise  __________ times a day. Shoulder extension 1. Sit in a stable chair without armrests, or stand up. 2. Secure an exercise band to a stable object in front of you so it is at shoulder height. 3. Hold one end of the exercise band in each hand. Your palms should face each other. 4. Straighten your elbows and lift your hands up to shoulder height. 5. Step back, away from the secured end of the exercise band, until the band is tight and there is no slack. 6. Squeeze your shoulder blades together as you pull your hands down to the sides of your thighs (extension). Stop when your hands are straight down by your sides. Do not let your hands go behind your body. 7. Hold for __________ seconds. 8. Slowly return to the starting position. Repeat __________ times. Complete this exercise __________ times a day. Shoulder row 1. Sit in a stable chair without armrests, or stand up. 2. Secure an exercise band to a stable object in front of you so it is at waist height. 3. Hold one end of the exercise band in each hand. Position your palms so that your thumbs are facing the ceiling (neutral position). 4. Bend each of your elbows to a 90-degree angle (right angle) and keep your upper arms at your sides. 5. Step back until the band is tight and there is no slack. 6. Slowly pull your elbows back behind you. 7. Hold for __________ seconds. 8. Slowly return to the starting position. Repeat __________ times. Complete this exercise __________ times a day. Shoulder press-ups  1. Sit in a stable chair that has armrests. Sit upright, with your feet flat on the floor. 2. Put your hands on the armrests so your elbows are bent and your fingers are pointing forward. Your hands should be about even with the sides of your body. 3. Push down on the armrests and use your arms to lift yourself off the chair. Straighten your elbows and lift yourself up as much as you comfortably can. ?  Move your shoulder blades down, and avoid  letting your shoulders move up toward your ears. ? Keep your feet on the ground. As you get stronger, your feet should support less of your body weight as you lift yourself up. 4. Hold for __________ seconds. 5. Slowly lower yourself back into the chair. Repeat __________ times. Complete this exercise __________ times a day. Wall push-ups  1. Stand so you are facing a stable wall. Your feet should be about one arm-length away from the wall. 2. Lean forward and place your palms on the wall at shoulder height. 3. Keep your feet flat on the floor as you bend your elbows and lean forward toward the wall. 4. Hold for __________ seconds. 5. Straighten your elbows to push yourself back to the starting position. Repeat __________ times. Complete this exercise __________ times a day. This information is not intended to replace advice given to you by your health care provider. Make sure you discuss any questions you have with your health care provider. Document Revised: 04/05/2019 Document Reviewed: 01/11/2019 Elsevier Patient Education  [place].

## 2020-03-09 NOTE — Progress Notes (Signed)
    SUBJECTIVE:   CHIEF COMPLAINT / HPI: shoulder pain  Complains of L shoulder pain which began late last year when he fell off his back deck and tried to catch himself on outstretched hand. Did not feel pop/crack. Worse in the mornings. Decreased mobility. Tried heat, ice, topical ointments. Has seen the most improvement with Voltaren gel. Would like a referral to orthopedics.   Diabetes- does not endorse polyuria, polydipsia  PERTINENT  PMH / PSH: arthritis  OBJECTIVE:   BP 138/80   Pulse 79   Ht 5\' 7"  (1.702 m)   Wt 184 lb (83.5 kg)   SpO2 98%   BMI 28.82 kg/m   General: NAD, pleasant, able to participate in exam Extremities: no edema or cyanosis. WWP. L arm ROM decreased in all fields, especially external rotation Skin: warm and dry, no rashes noted Neuro: alert and oriented x4, no focal deficits Psych: Normal affect and mood   ASSESSMENT/PLAN:   Adhesive capsulitis Patient resistant to steroid injection due to fear of needles.  Is open to getting injection if not improved with exercises. Patient given handout and instructions for stretches. appointment made to return for injection  Diabetes well controlled.   Type 2 diabetes with complication ([place]) Continue current medication Due for lipid panel- check at next appointment     Richarda Osmond, [place]

## 2020-03-10 DIAGNOSIS — M7500 Adhesive capsulitis of unspecified shoulder: Secondary | ICD-10-CM | POA: Insufficient documentation

## 2020-03-10 HISTORY — DX: Adhesive capsulitis of unspecified shoulder: M75.00

## 2020-03-10 NOTE — Assessment & Plan Note (Addendum)
Continue current medication Due for lipid panel- check at next appointment

## 2020-03-10 NOTE — Assessment & Plan Note (Signed)
Patient resistant to steroid injection due to fear of needles.  Is open to getting injection if not improved with exercises. Patient given handout and instructions for stretches. appointment made to return for injection  Diabetes well controlled.

## 2020-03-19 ENCOUNTER — Telehealth: Payer: Self-pay | Admitting: *Deleted

## 2020-03-19 NOTE — Telephone Encounter (Signed)
Pt calling in about shoulder pain. Wants dr to call him. Please advise. Dallyn Bergland Kennon Holter, CMA

## 2020-03-31 ENCOUNTER — Ambulatory Visit (INDEPENDENT_AMBULATORY_CARE_PROVIDER_SITE_OTHER): Payer: Medicare Other | Admitting: Student in an Organized Health Care Education/Training Program

## 2020-03-31 ENCOUNTER — Other Ambulatory Visit: Payer: Self-pay

## 2020-03-31 ENCOUNTER — Encounter: Payer: Self-pay | Admitting: Student in an Organized Health Care Education/Training Program

## 2020-03-31 VITALS — BP 140/80 | HR 77 | Ht 67.0 in | Wt 184.6 lb

## 2020-03-31 DIAGNOSIS — M7502 Adhesive capsulitis of left shoulder: Secondary | ICD-10-CM

## 2020-03-31 MED ORDER — METHYLPREDNISOLONE ACETATE 40 MG/ML IJ SUSP
40.0000 mg | Freq: Once | INTRAMUSCULAR | Status: AC
  Administered 2020-03-31: 40 mg via INTRAMUSCULAR

## 2020-03-31 NOTE — Progress Notes (Signed)
    SUBJECTIVE:   CHIEF COMPLAINT / HPI: continued shoulder pain  Patient has been adherent with at home exercises and has not seen improvement in shoulder pain or function. Returning today to get shoulder steroid injection as discussed as our second line option.   PERTINENT  PMH / PSH: DM. Last A1c was 03/09/2020 and was 6.3  OBJECTIVE:   BP 140/80   Pulse 77   Ht 5\' 7"  (1.702 m)   Wt 184 lb 9.6 oz (83.7 kg)   SpO2 100%   BMI 28.91 kg/m   General: NAD, pleasant, able to participate in exam Extremities: no edema or cyanosis. WWP. Left shoulder ROM slightly improved since last visit. Still having decreased in especially external and abduction associated with pain. No erythema or swelling. Skin: warm and dry, no rashes noted Neuro: alert and oriented x4, no focal deficits Psych: Normal affect and mood  ASSESSMENT/PLAN:   Adhesive capsulitis Patient describes little to no improvement with conservative managements.  Posterior glenohumeral steroid injection today Provided patient with return precautions. Otherwise follow up in 2-3 months    Shoulder Injection Procedure Note  Pre-operative Diagnosis: left adhesive capsulitis   Post-operative Diagnosis: same  Indications: Adhesive capsulitis  Anesthesia: pain freeze spray, 4cc 1% lidocaine 1cc depo medrol  Procedure Details   Verbal consent was obtained for the procedure. The shoulder was prepped with iodine and the skin was anesthetized. Using a 22 gauge needle the glenohumeral joint is injected with 4 mL 1% lidocaine and 1 mL of betamethasone (CELESTONE) under the posterior aspect of the acromion. The injection site was cleansed with topical isopropyl alcohol and a dressing was applied.  Complications:  None; patient tolerated the procedure well.  Patient given return precautions and will follow up in 3 months.   [place]

## 2020-04-02 NOTE — Assessment & Plan Note (Signed)
Patient describes little to no improvement with conservative managements.  Posterior glenohumeral steroid injection today Provided patient with return precautions. Otherwise follow up in 2-3 months

## 2020-04-13 ENCOUNTER — Other Ambulatory Visit: Payer: Self-pay | Admitting: Student in an Organized Health Care Education/Training Program

## 2020-04-14 ENCOUNTER — Telehealth (HOSPITAL_COMMUNITY): Payer: Self-pay

## 2020-04-14 NOTE — Telephone Encounter (Signed)

## 2020-04-15 ENCOUNTER — Ambulatory Visit (INDEPENDENT_AMBULATORY_CARE_PROVIDER_SITE_OTHER)
Admission: RE | Admit: 2020-04-15 | Discharge: 2020-04-15 | Disposition: A | Discharge status: Home / Self Care | Payer: Medicare Other | Source: Ambulatory Visit | Attending: Family | Admitting: Family

## 2020-04-15 ENCOUNTER — Ambulatory Visit (HOSPITAL_COMMUNITY)
Admission: RE | Admit: 2020-04-15 | Discharge: 2020-04-15 | Disposition: A | Discharge status: Home / Self Care | Payer: Medicare Other | Source: Ambulatory Visit | Attending: Vascular Surgery | Admitting: Vascular Surgery

## 2020-04-15 ENCOUNTER — Ambulatory Visit (INDEPENDENT_AMBULATORY_CARE_PROVIDER_SITE_OTHER): Payer: Medicare Other | Admitting: Physician Assistant

## 2020-04-15 ENCOUNTER — Other Ambulatory Visit: Payer: Self-pay

## 2020-04-15 VITALS — BP 97/62 | HR 70 | Temp 97.2°F | Resp 16 | Ht 67.0 in | Wt 181.1 lb

## 2020-04-15 DIAGNOSIS — I739 Peripheral vascular disease, unspecified: Secondary | ICD-10-CM

## 2020-04-15 DIAGNOSIS — I779 Disorder of arteries and arterioles, unspecified: Secondary | ICD-10-CM | POA: Insufficient documentation

## 2020-04-15 DIAGNOSIS — I998 Other disorder of circulatory system: Secondary | ICD-10-CM | POA: Diagnosis not present

## 2020-04-15 DIAGNOSIS — Z720 Tobacco use: Secondary | ICD-10-CM

## 2020-04-15 DIAGNOSIS — I70229 Atherosclerosis of native arteries of extremities with rest pain, unspecified extremity: Secondary | ICD-10-CM

## 2020-04-15 NOTE — Progress Notes (Signed)
Established Critical Limb Ischemia Patient   History of Present Illness   [name] is a 69 y.o. (September 24, 1951) male who presents to go over vascular studies related to peripheral arterial disease.  Surgical history significant for right common iliac artery VBX stent graft by Dr. Scot Dock in 2018 patient also had left femoral to popliteal vein bypass by Dr. Kellie Simmering, which was then redone with PTFE in 2017 by Dr. Scot Dock.  Most recently patient underwent emergent thrombectomy of left lower extremity bypass by Dr. Carlis Abbott in January of this year.  Patient states he does not have any claudication, rest pain, or tissue changes of left lower extremity.  All incisions of left leg are well-healed.  Patient however states for the past month and a half he has had worsening rest pain in right foot.  Lately he has been having to get up and walk around at night due to rest pain.  He denies any wounds of right foot.  He is taking aspirin statin daily.  Patient states he smokes a cigar every other day.  Current Outpatient Medications  Medication Sig Dispense Refill  . aspirin EC 81 MG EC tablet Take 1 tablet (81 mg total) by mouth daily.    Marland Kitchen atorvastatin (LIPITOR) 40 MG tablet TAKE 1 TABLET BY MOUTH DAILY 90 tablet 2  . benazepril (LOTENSIN) 10 MG tablet TAKE 1 TABLET BY MOUTH EVERY DAY 90 tablet 1  . benazepril (LOTENSIN) 5 MG tablet Take 1 tablet (5 mg total) by mouth daily. 90 tablet 0  . BYSTOLIC 20 MG TABS TAKE 1 TABLET BY MOUTH EVERY DAY (Patient taking differently: Take 20 mg by mouth daily. ) 90 tablet 1  . clopidogrel (PLAVIX) 75 MG tablet Take 1 tablet (75 mg total) by mouth daily. 90 tablet 1  . [facility] ACETAMINOPHEN EX ST 500 MG tablet TAKE 2 TABLETS (1,000 MG TOTAL) BY MOUTH EVERY 8 (EIGHT) HOURS AS NEEDED FOR MODERATE PAIN. (Patient taking differently: Take 1,000 mg by mouth every 8 (eight) hours as needed for moderate pain. ) 90 tablet 0  . ezetimibe (ZETIA) 10 MG tablet TAKE 1 TABLET BY MOUTH ONCE  DAILY 90 tablet 1  . FLUoxetine (PROZAC) 10 MG capsule TAKE 1 CAPSULE BY MOUTH ONCE DAILY (Patient taking differently: Take 10 mg by mouth daily. ) 90 capsule 2  . gabapentin (NEURONTIN) 100 MG capsule Take 1 capsule (100 mg total) by mouth at bedtime. 90 capsule 1  . gabapentin (NEURONTIN) 300 MG capsule Take 1 capsule (300 mg total) by mouth at bedtime. 90 capsule 0  . metFORMIN (GLUCOPHAGE) 1000 MG tablet Take 1 tablet (1,000 mg total) by mouth 2 (two) times daily with a meal. 180 tablet 1  . pantoprazole (PROTONIX) 40 MG tablet TAKE 1 TABLET BY MOUTH DAILY 60 tablet 0  . ranitidine (ZANTAC 75) 75 MG tablet Take 1 tablet (75 mg total) by mouth 2 (two) times daily as needed for heartburn (heartburn, acid reflux). 30 tablet 0  . traMADol (ULTRAM) 50 MG tablet Take 1 tablet (50 mg total) by mouth every 6 (six) hours as needed. 25 tablet 0   No current facility-administered medications for this visit.    REVIEW OF SYSTEMS (negative unless checked):   Cardiac:  []  Chest pain or chest pressure? []  Shortness of breath upon activity? []  Shortness of breath when lying flat? []  Irregular heart rhythm?  Vascular:  []  Pain in calf, thigh, or hip brought on by walking? []  Pain in feet  at night that wakes you up from your sleep? []  Blood clot in your veins? []  Leg swelling?  Pulmonary:  []  Oxygen at home? []  Productive cough? []  Wheezing?  Neurologic:  []  Sudden weakness in arms or legs? []  Sudden numbness in arms or legs? []  Sudden onset of difficult speaking or slurred speech? []  Temporary loss of vision in one eye? []  Problems with dizziness?  Gastrointestinal:  []  Blood in stool? []  Vomited blood?  Genitourinary:  []  Burning when urinating? []  Blood in urine?  Psychiatric:  []  Major depression  Hematologic:  []  Bleeding problems? []  Problems with blood clotting?  Dermatologic:  []  Rashes or ulcers?  Constitutional:  []  Fever or chills?  Ear/Nose/Throat:  []  Change  in hearing? []  Nose bleeds? []  Sore throat?  Musculoskeletal:  []  Back pain? []  Joint pain? []  Muscle pain?   Physical Examination   Vitals:   04/15/20 1016  BP: 97/62  Pulse: 70  Resp: 16  Temp: (!) 97.2 F (36.2 C)  SpO2: 98%  Weight: 181 lb 1.6 oz (82.1 kg)  Height: 5\' 7"  (1.702 m)   Body mass index is 28.36 kg/m.  General:  WDWN in NAD; vital signs documented above Gait: Not observed HENT: WNL, normocephalic Pulmonary: normal non-labored breathing , without Rales, rhonchi,  wheezing Cardiac: regular HR Abdomen: soft, NT, no masses Skin: without rashes Vascular Exam/Pulses:  Right Left  Radial 2+ (normal) 2+ (normal)  Femoral 1+ (weak) 2+ (normal)  Popliteal absent absent  DP absent absent  PT absent absent   Extremities: without ischemic changes, without Gangrene , without cellulitis; without open wounds;  Musculoskeletal: no muscle wasting or atrophy  Neurologic: A&O X 3;  No focal weakness or paresthesias are detected Psychiatric:  The pt has Normal affect.  Non-Invasive Vascular Imaging  Patent right iliac system with peak systolic velocity of [id number] and mid right common iliac artery 50 to 75% stenosis of right common femoral artery 75 to 99% stenosis of right profunda Right SFA and popliteal occluded  Left femoral to peroneal bypass patent   Medical Decision Making   [name] is a 69 y.o. male who presents for left lower extremity graft surveillance; also with 6-week history of right foot rest pain   Bypass duplex of left lower extremity demonstrates a widely patent bypass without any areas of hemodynamic significant stenosis  Aortoiliac duplex demonstrates a patent right common iliac artery stent  Patient has worsening right foot rest pain keeping him up at night in the presence of an occluded SFA and popliteal as well as stenosis noted in common femoral artery and profunda  Plan will be for aortogram with right lower extremity runoff and  possible intervention with Dr. Carlis Abbott on Wednesday, 04/22/2020  Risks and benefits of angiography were discussed with the patient and he agrees to proceed   Dagoberto Ligas PA-C [facility] Office: [phone]  Clinic MD: One[facility] Alar

## 2020-04-18 ENCOUNTER — Other Ambulatory Visit: Payer: Self-pay | Admitting: Student in an Organized Health Care Education/Training Program

## 2020-04-20 ENCOUNTER — Other Ambulatory Visit (HOSPITAL_COMMUNITY)
Admission: RE | Admit: 2020-04-20 | Discharge: 2020-04-20 | Disposition: A | Discharge status: Home / Self Care | Payer: Medicare Other | Source: Ambulatory Visit | Attending: Vascular Surgery | Admitting: Vascular Surgery

## 2020-04-20 DIAGNOSIS — Z01812 Encounter for preprocedural laboratory examination: Secondary | ICD-10-CM | POA: Insufficient documentation

## 2020-04-20 DIAGNOSIS — Z20822 Contact with and (suspected) exposure to covid-19: Secondary | ICD-10-CM | POA: Insufficient documentation

## 2020-04-20 LAB — SARS CORONAVIRUS 2 (TAT 6-24 HRS): SARS Coronavirus 2: NEGATIVE

## 2020-04-22 ENCOUNTER — Ambulatory Visit (HOSPITAL_COMMUNITY)
Admission: RE | Admit: 2020-04-22 | Discharge: 2020-04-22 | Disposition: A | Discharge status: Home / Self Care | Payer: Medicare Other | Source: Home / Self Care | Attending: Vascular Surgery | Admitting: Vascular Surgery

## 2020-04-22 ENCOUNTER — Ambulatory Visit (HOSPITAL_BASED_OUTPATIENT_CLINIC_OR_DEPARTMENT_OTHER): Payer: Medicare Other

## 2020-04-22 ENCOUNTER — Other Ambulatory Visit: Payer: Self-pay

## 2020-04-22 ENCOUNTER — Encounter (HOSPITAL_COMMUNITY): Payer: Self-pay | Admitting: Vascular Surgery

## 2020-04-22 ENCOUNTER — Encounter (HOSPITAL_COMMUNITY)
Admission: RE | Disposition: A | Discharge status: Home / Self Care | Payer: Self-pay | Source: Home / Self Care | Attending: Vascular Surgery

## 2020-04-22 DIAGNOSIS — Z7982 Long term (current) use of aspirin: Secondary | ICD-10-CM | POA: Insufficient documentation

## 2020-04-22 DIAGNOSIS — Z7902 Long term (current) use of antithrombotics/antiplatelets: Secondary | ICD-10-CM | POA: Insufficient documentation

## 2020-04-22 DIAGNOSIS — I70221 Atherosclerosis of native arteries of extremities with rest pain, right leg: Secondary | ICD-10-CM

## 2020-04-22 DIAGNOSIS — Z01810 Encounter for preprocedural cardiovascular examination: Secondary | ICD-10-CM

## 2020-04-22 DIAGNOSIS — Z79899 Other long term (current) drug therapy: Secondary | ICD-10-CM | POA: Insufficient documentation

## 2020-04-22 DIAGNOSIS — I739 Peripheral vascular disease, unspecified: Secondary | ICD-10-CM

## 2020-04-22 DIAGNOSIS — F17290 Nicotine dependence, other tobacco product, uncomplicated: Secondary | ICD-10-CM | POA: Insufficient documentation

## 2020-04-22 DIAGNOSIS — Z7984 Long term (current) use of oral hypoglycemic drugs: Secondary | ICD-10-CM | POA: Insufficient documentation

## 2020-04-22 HISTORY — PX: ABDOMINAL AORTOGRAM W/LOWER EXTREMITY: CATH118223

## 2020-04-22 LAB — POCT I-STAT, CHEM 8
BUN: 7 mg/dL — ABNORMAL LOW (ref 8–23)
Calcium, Ion: 1.19 mmol/L (ref 1.15–1.40)
Chloride: 102 mmol/L (ref 98–111)
Creatinine, Ser: 0.7 mg/dL (ref 0.61–1.24)
Glucose, Bld: 99 mg/dL (ref 70–99)
HCT: 31 % — ABNORMAL LOW (ref 39.0–52.0)
Hemoglobin: 10.5 g/dL — ABNORMAL LOW (ref 13.0–17.0)
Potassium: 4.2 mmol/L (ref 3.5–5.1)
Sodium: 139 mmol/L (ref 135–145)
TCO2: 28 mmol/L (ref 22–32)

## 2020-04-22 LAB — GLUCOSE, CAPILLARY: Glucose-Capillary: 96 mg/dL (ref 70–99)

## 2020-04-22 SURGERY — ABDOMINAL AORTOGRAM W/LOWER EXTREMITY
Anesthesia: LOCAL | Laterality: Bilateral

## 2020-04-22 MED ORDER — FENTANYL CITRATE (PF) 100 MCG/2ML IJ SOLN
INTRAMUSCULAR | Status: DC | PRN
  Administered 2020-04-22 (×2): 50 ug via INTRAVENOUS

## 2020-04-22 MED ORDER — HYDRALAZINE HCL 20 MG/ML IJ SOLN: 5.0000 mg | INTRAMUSCULAR | Status: DC | PRN

## 2020-04-22 MED ORDER — SODIUM CHLORIDE 0.9 % IV SOLN: INTRAVENOUS | Status: AC

## 2020-04-22 MED ORDER — LABETALOL HCL 5 MG/ML IV SOLN: 10.0000 mg | INTRAVENOUS | Status: DC | PRN

## 2020-04-22 MED ORDER — MIDAZOLAM HCL 2 MG/2ML IJ SOLN
INTRAMUSCULAR | Status: DC | PRN
  Administered 2020-04-22 (×2): 1 mg via INTRAVENOUS

## 2020-04-22 MED ORDER — SODIUM CHLORIDE 0.9 % IV SOLN: INTRAVENOUS | Status: DC

## 2020-04-22 MED ORDER — ACETAMINOPHEN 325 MG PO TABS: 650.0000 mg | ORAL_TABLET | ORAL | Status: DC | PRN

## 2020-04-22 MED ORDER — IODIXANOL 320 MG/ML IV SOLN
INTRAVENOUS | Status: DC | PRN
  Administered 2020-04-22: 112 mL via INTRA_ARTERIAL

## 2020-04-22 MED ORDER — SODIUM CHLORIDE 0.9% FLUSH: 3.0000 mL | INTRAVENOUS | Status: DC | PRN

## 2020-04-22 MED ORDER — LIDOCAINE HCL (PF) 1 % IJ SOLN
INTRAMUSCULAR | Status: DC | PRN
  Administered 2020-04-22: 40 mL via INTRADERMAL

## 2020-04-22 MED ORDER — LIDOCAINE HCL (PF) 1 % IJ SOLN
INTRAMUSCULAR | Status: AC
  Filled 2020-04-22: qty 30

## 2020-04-22 MED ORDER — FENTANYL CITRATE (PF) 100 MCG/2ML IJ SOLN
INTRAMUSCULAR | Status: AC
  Filled 2020-04-22: qty 2

## 2020-04-22 MED ORDER — SODIUM CHLORIDE 0.9 % IV SOLN: 250.0000 mL | INTRAVENOUS | Status: DC | PRN

## 2020-04-22 MED ORDER — HEPARIN (PORCINE) IN NACL 1000-0.9 UT/500ML-% IV SOLN
INTRAVENOUS | Status: DC | PRN
  Administered 2020-04-22 (×2): 500 mL

## 2020-04-22 MED ORDER — ONDANSETRON HCL 4 MG/2ML IJ SOLN: 4.0000 mg | Freq: Four times a day (QID) | INTRAMUSCULAR | Status: DC | PRN

## 2020-04-22 MED ORDER — MIDAZOLAM HCL 2 MG/2ML IJ SOLN
INTRAMUSCULAR | Status: AC
  Filled 2020-04-22: qty 2

## 2020-04-22 MED ORDER — SODIUM CHLORIDE 0.9% FLUSH: 3.0000 mL | Freq: Two times a day (BID) | INTRAVENOUS | Status: DC

## 2020-04-22 MED ORDER — HEPARIN (PORCINE) IN NACL 1000-0.9 UT/500ML-% IV SOLN
INTRAVENOUS | Status: AC
  Filled 2020-04-22: qty 1000

## 2020-04-22 SURGICAL SUPPLY — 11 items
CATH BEACON 5 .035 65 KMP TIP (CATHETERS) ×1 IMPLANT
CATH OMNI FLUSH 5F 65CM (CATHETERS) ×1 IMPLANT
KIT MICROPUNCTURE NIT STIFF (SHEATH) ×1 IMPLANT
KIT PV (KITS) ×2 IMPLANT
SHEATH PINNACLE 5F 10CM (SHEATH) ×1 IMPLANT
SHEATH PROBE COVER 6X72 (BAG) ×1 IMPLANT
SYR MEDRAD MARK V 150ML (SYRINGE) ×1 IMPLANT
TRANSDUCER W/STOPCOCK (MISCELLANEOUS) ×2 IMPLANT
TRAY PV CATH (CUSTOM PROCEDURE TRAY) ×2 IMPLANT
WIRE BENTSON .035X145CM (WIRE) ×1 IMPLANT
WIRE TORQFLEX AUST .018X40CM (WIRE) ×1 IMPLANT

## 2020-04-22 NOTE — Progress Notes (Signed)
Site Area: right groin Site Prior to Removal: Level 0 Pressure Applied For:  15   minutes Manual: yes Patient Status During Pull: stable Post Pull Site: Level 0 Post Pull Instructions Given: YES Post Pull Pulses Present: dopplered  Dressing Applied:gauze and tegaderm Bedrest begins @ [id number] till 1620 Comments:  Removed by  Janith Lima, RN

## 2020-04-22 NOTE — Discharge Instructions (Signed)

## 2020-04-22 NOTE — Progress Notes (Signed)
Pt denies SOB, chest pain, and being under the care of a cardiologist. Pt denies having a cardiac cath but stated that a stress test and echo were both performed " more than 5-6 years ago, I don't remember where, it was with [facility]. " Pt denies having a chest x ray in the last year. Pt made aware to stop taking  vitamins, fish oil and herbal medications. Do not take any NSAIDs ie: Ibuprofen, Advil, Naproxen (Aleve), Motrin, BC and Goody Powder. Pt stated that he will call the surgeon's office in the morning for pre-op instructions regarding Plavix. Pt made aware to hold Metformin on DOS.  Pt made aware to check CBG every 2 hours prior to arrival to hospital on DOS. Pt made aware to treat a CBG < 70 with 4 ounces of apple juice, wait 15 minutes after intervention to recheck CBG, if CBG remains < 70, call Short Stay unit to speak with a nurse.  Pt reminded to quarantine.  Pt verbalized understanding of all pre-op instructions.

## 2020-04-22 NOTE — Progress Notes (Signed)
Lower extremity saphenous mapping has been completed.   Preliminary results in CV Proc.   Abram Sander 04/22/2020 2:49 PM

## 2020-04-22 NOTE — Op Note (Signed)
Patient name: [name] MRN: [id number] DOB: 1951/02/20 Sex: male  04/22/2020 Pre-operative Diagnosis: Critical limb ischemia of the right lower extremity with rest pain Post-operative diagnosis:  Same Surgeon:  Marty Heck, MD Procedure Performed: 1.  Ultrasound-guided access of the right common femoral artery 2.  Aortogram 3.  Bilateral lower extremity arteriogram with runoff 4.  31 minutes of monitored moderate conscious sedation time  Indications: Patient is a 69 year old male well-known to the vascular surgery service.  He has previously had multiple bypasses in the left lower extremity and currently has a left common femoral to peroneal prosthetic bypass that recently occluded and he underwent thrombectomy on Christmas Eve of 2020.  He has also had a previous right external iliac stent.  All of his surgeries been previously performed by Dr. Randa Ngo.  He presents today for planned arteriogram of the right leg where he developed rest pain and has a known flush SFA occlusion.  Findings:   Initially attempted to access the left common femoral artery but patient has significant scar tissue and asked that we stop any further attempts due to pain and discomfort.  Ultimately the right common femoral artery was accessed above a large calcified near occlusive plaque.  Aortogram showed calcified aortoiliac segment bilaterally but on the side of interest which is the right side he did not have any flow-limiting stenosis in the aortoiliac segment with a patent right external iliac stent.  He has a near occlusive lesion in the right common femoral artery with heavy calcified plaque greater than 95% stenosis.  There is only profunda runoff with flush SFA occlusion.  The entire SFA as well as the above and below-knee popliteal artery as well as tibial trifurcation is all occluded.  He only constitutes peroneal artery in the mid calf and that is his only runoff distally.  The left common  femoral to peroneal bypass is widely patent.  The peroneal is fairly small and diseased distal to the bypass but there is retrograde flow with reconstitution of a diseased posterior tibial as well.    Procedure:  The patient was identified in the holding area and taken to room 8.  The patient was then placed supine on the table and prepped and draped in the usual sterile fashion.  A time out was called.  Ultrasound was used to evaluate the left common femoral artery.  It was patent .  A digital ultrasound image was acquired.  Ultimately I placed a least 20 cc of lidocaine around the left groin and attempted to access the left groin under ultrasound guidance with a micro access needle.  He has significant scar tissue in the left groin given multiple previous open surgical revisions and ultimately even after significant infiltration of  lidocaine he asked [place] to stop.  Given he has a known flush SFA occlusion on the right I then accessed the right groin retrograde above a large plaque with ultrasound.  We evaluated the right common femoral artery it was patent and image was saved.  The micro access needle was placed above the plaque in the right common femoral artery and placed a micro sheath.  I then advanced a Bentson wire and exchanged for a short 5 [place] sheath.  I then used a KMP with a soft angled glide to get my wire up into the infrarenal aorta and exchanged for an Omni Flush catheter.  Aortogram was obtained with pertinent findings noted above.  We then pulled the catheter down shot  bilateral lower extremity runoff with pertinent findings noted above.  Given a heavily diseased right common femoral artery I elected to take him to holding to have his sheath removed.  Plan: Patient will be considered for right femoral endarterectomy and profundoplasty.  I will also get vein mapping given if this is not adequate would potentially need a femoral peroneal bypass.    Marty Heck, MD [facility] [place] Office: [place]

## 2020-04-22 NOTE — H&P (Signed)
History and Physical Interval Note:  04/22/2020 9:53 AM  [name]  has presented today for surgery, with the diagnosis of peripheral vascular disease.  The various methods of treatment have been discussed with the patient and family. After consideration of risks, benefits and other options for treatment, the patient has consented to  Procedure(s): ABDOMINAL AORTOGRAM W/LOWER EXTREMITY (N/A) as a surgical intervention.  The patient's history has been reviewed, patient examined, no change in status, stable for surgery.  I have reviewed the patient's chart and labs.  Questions were answered to the patient's satisfaction.    Aortogram, right leg arteriogram, rest pain.    [name]  Established Critical Limb Ischemia Patient  History of Present Illness  [name] is a 69 y.o. ([birth year]) male who presents to go over vascular studies related to peripheral arterial disease. Surgical history significant for right common iliac artery VBX stent graft by Dr. Scot Dock in 2018 patient also had left femoral to popliteal vein bypass by Dr. Kellie Simmering, which was then redone with PTFE in 2017 by Dr. Scot Dock. Most recently patient underwent emergent thrombectomy of left lower extremity bypass by Dr. Carlis Abbott in January of this year. Patient states he does not have any claudication, rest pain, or tissue changes of left lower extremity. All incisions of left leg are well-healed. Patient however states for the past month and a half he has had worsening rest pain in right foot. Lately he has been having to get up and walk around at night due to rest pain. He denies any wounds of right foot. He is taking aspirin statin daily. Patient states he smokes a cigar every other day.        Current Outpatient Medications  Medication Sig Dispense Refill  . aspirin EC 81 MG EC tablet Take 1 tablet (81 mg total) by mouth daily.    Marland Kitchen atorvastatin (LIPITOR) 40 MG tablet TAKE 1 TABLET BY MOUTH DAILY 90 tablet 2  .  benazepril (LOTENSIN) 10 MG tablet TAKE 1 TABLET BY MOUTH EVERY DAY 90 tablet 1  . benazepril (LOTENSIN) 5 MG tablet Take 1 tablet (5 mg total) by mouth daily. 90 tablet 0  . BYSTOLIC 20 MG TABS TAKE 1 TABLET BY MOUTH EVERY DAY (Patient taking differently: Take 20 mg by mouth daily. ) 90 tablet 1  . clopidogrel (PLAVIX) 75 MG tablet Take 1 tablet (75 mg total) by mouth daily. 90 tablet 1  . [facility] ACETAMINOPHEN EX ST 500 MG tablet TAKE 2 TABLETS (1,000 MG TOTAL) BY MOUTH EVERY 8 (EIGHT) HOURS AS NEEDED FOR MODERATE PAIN. (Patient taking differently: Take 1,000 mg by mouth every 8 (eight) hours as needed for moderate pain. ) 90 tablet 0  . ezetimibe (ZETIA) 10 MG tablet TAKE 1 TABLET BY MOUTH ONCE DAILY 90 tablet 1  . FLUoxetine (PROZAC) 10 MG capsule TAKE 1 CAPSULE BY MOUTH ONCE DAILY (Patient taking differently: Take 10 mg by mouth daily. ) 90 capsule 2  . gabapentin (NEURONTIN) 100 MG capsule Take 1 capsule (100 mg total) by mouth at bedtime. 90 capsule 1  . gabapentin (NEURONTIN) 300 MG capsule Take 1 capsule (300 mg total) by mouth at bedtime. 90 capsule 0  . metFORMIN (GLUCOPHAGE) 1000 MG tablet Take 1 tablet (1,000 mg total) by mouth 2 (two) times daily with a meal. 180 tablet 1  . pantoprazole (PROTONIX) 40 MG tablet TAKE 1 TABLET BY MOUTH DAILY 60 tablet 0  . ranitidine (ZANTAC 75) 75 MG tablet Take  1 tablet (75 mg total) by mouth 2 (two) times daily as needed for heartburn (heartburn, acid reflux). 30 tablet 0  . traMADol (ULTRAM) 50 MG tablet Take 1 tablet (50 mg total) by mouth every 6 (six) hours as needed. 25 tablet 0   No current facility-administered medications for this visit.   REVIEW OF SYSTEMS (negative unless checked):  Cardiac:  ? Chest pain or chest pressure?  ? Shortness of breath upon activity?  ? Shortness of breath when lying flat?  ? Irregular heart rhythm?  Vascular:  ? Pain in calf, thigh, or hip brought on by walking?  ? Pain in feet at night that wakes you up from  your sleep?  ? Blood clot in your veins?  ? Leg swelling?  Pulmonary:  ? Oxygen at home?  ? Productive cough?  ? Wheezing?  Neurologic:  ? Sudden weakness in arms or legs?  ? Sudden numbness in arms or legs?  ? Sudden onset of difficult speaking or slurred speech?  ? Temporary loss of vision in one eye?  ? Problems with dizziness?  Gastrointestinal:  ? Blood in stool?  ? Vomited blood?  Genitourinary:  ? Burning when urinating?  ? Blood in urine?  Psychiatric:  ? Major depression  Hematologic:  ? Bleeding problems?  ? Problems with blood clotting?  Dermatologic:  ? Rashes or ulcers?  Constitutional:  ? Fever or chills?  Ear/Nose/Throat:  ? Change in hearing?  ? Nose bleeds?  ? Sore throat?  Musculoskeletal:  ? Back pain?  ? Joint pain?  ? Muscle pain?  Physical Examination      Vitals:   04/15/20 1016  BP: 97/62  Pulse: 70  Resp: 16  Temp: (!) 97.2 F (36.2 C)  SpO2: 98%  Weight: 181 lb 1.6 oz (82.1 kg)  Height: 5\' 7"  (1.702 m)   Body mass index is 28.36 kg/m.  General: WDWN in NAD; vital signs documented above  Gait: Not observed  HENT: WNL, normocephalic  Pulmonary: normal non-labored breathing , without Rales, rhonchi, wheezing  Cardiac: regular HR  Abdomen: soft, NT, no masses  Skin: without rashes  Vascular Exam/Pulses:   Right Left  Radial 2+ (normal) 2+ (normal)  Femoral 1+ (weak) 2+ (normal)  Popliteal absent absent  DP absent absent  PT absent absent  Extremities: without ischemic changes, without Gangrene , without cellulitis; without open wounds;  Musculoskeletal: no muscle wasting or atrophy  Neurologic: A&O X 3; No focal weakness or paresthesias are detected  Psychiatric: The pt has Normal affect.  Non-Invasive Vascular Imaging  Patent right iliac system with peak systolic velocity of [id number] and mid right common iliac artery  50 to 75% stenosis of right common femoral artery  75 to 99% stenosis of right profunda  Right SFA and  popliteal occluded  Left femoral to peroneal bypass patent  Medical Decision Making  [name] is a 69 y.o. male who presents for left lower extremity graft surveillance; also with 6-week history of right foot rest pain  Bypass duplex of left lower extremity demonstrates a widely patent bypass without any areas of hemodynamic significant stenosis  Aortoiliac duplex demonstrates a patent right common iliac artery stent  Patient has worsening right foot rest pain keeping him up at night in the presence of an occluded SFA and popliteal as well as stenosis noted in common femoral artery and profunda  Plan will be for aortogram with right lower extremity runoff and possible intervention with Dr. Carlis Abbott on  Wednesday, 04/22/2020  Risks and benefits of angiography were discussed with the patient and he agrees to proceed Dagoberto Ligas PA-C  [facility] [place]  Office: [phone]  Clinic MD: One[facility] Alar

## 2020-04-23 ENCOUNTER — Other Ambulatory Visit (HOSPITAL_COMMUNITY)
Admission: RE | Admit: 2020-04-23 | Discharge: 2020-04-23 | Disposition: A | Discharge status: Home / Self Care | Payer: Medicare Other | Source: Ambulatory Visit | Attending: Vascular Surgery | Admitting: Vascular Surgery

## 2020-04-23 DIAGNOSIS — Z01812 Encounter for preprocedural laboratory examination: Secondary | ICD-10-CM | POA: Insufficient documentation

## 2020-04-23 DIAGNOSIS — Z20822 Contact with and (suspected) exposure to covid-19: Secondary | ICD-10-CM | POA: Insufficient documentation

## 2020-04-23 LAB — SARS CORONAVIRUS 2 (TAT 6-24 HRS): SARS Coronavirus 2: NEGATIVE

## 2020-04-23 NOTE — Anesthesia Preprocedure Evaluation (Addendum)
Anesthesia Evaluation  Patient identified by MRN, date of birth, ID band Patient awake    Reviewed: Allergy & Precautions, NPO status , Patient's Chart, lab work & pertinent test results  History of Anesthesia Complications Negative for: history of anesthetic complications  Airway Mallampati: II  TM Distance: >3 FB Neck ROM: Limited    Dental  (+) Edentulous Upper, Edentulous Lower   Pulmonary Current Smoker and Patient abstained from smoking.,    Pulmonary exam normal        Cardiovascular hypertension, Pt. on medications and Pt. on home beta blockers (-) angina+ Peripheral Vascular Disease  Normal cardiovascular exam   '20 Carotid US - 1-39% b/l ICAS    Neuro/Psych PSYCHIATRIC DISORDERS Depression  S/p cervical fusion     GI/Hepatic Neg liver ROS, GERD  Medicated and Controlled,  Endo/Other  diabetes, Type 2, Oral Hypoglycemic Agents  Renal/GU negative Renal ROS     Musculoskeletal  (+) Arthritis ,   Abdominal   Peds  Hematology  (+) anemia ,  On plavix    Anesthesia Other Findings Covid neg 4/29  Reproductive/Obstetrics                            Anesthesia Physical Anesthesia Plan  ASA: III  Anesthesia Plan: General   Post-op Pain Management:    Induction: Intravenous  PONV Risk Score and Plan: 3 and Treatment may vary due to age or medical condition, Ondansetron and Dexamethasone  Airway Management Planned: Oral ETT  Additional Equipment: Arterial line  Intra-op Plan:   Post-operative Plan: Extubation in OR  Informed Consent: I have reviewed the patients History and Physical, chart, labs and discussed the procedure including the risks, benefits and alternatives for the proposed anesthesia with the patient or authorized representative who has indicated his/her understanding and acceptance.     Dental advisory given  Plan Discussed with: CRNA and  Anesthesiologist  Anesthesia Plan Comments:        Anesthesia Quick Evaluation

## 2020-04-24 ENCOUNTER — Inpatient Hospital Stay (HOSPITAL_COMMUNITY)
Admission: RE | Admit: 2020-04-24 | Discharge: 2020-04-27 | DRG: 254 | Disposition: A | Discharge status: Home Health | Payer: Medicare Other | Attending: Vascular Surgery | Admitting: Vascular Surgery

## 2020-04-24 ENCOUNTER — Encounter (HOSPITAL_COMMUNITY): Payer: Self-pay | Admitting: Vascular Surgery

## 2020-04-24 ENCOUNTER — Other Ambulatory Visit: Payer: Self-pay

## 2020-04-24 ENCOUNTER — Encounter (HOSPITAL_COMMUNITY)
Admission: RE | Disposition: A | Discharge status: Home / Self Care | Payer: Self-pay | Source: Home / Self Care | Attending: Vascular Surgery

## 2020-04-24 ENCOUNTER — Inpatient Hospital Stay (HOSPITAL_COMMUNITY): Payer: Medicare Other | Admitting: Anesthesiology

## 2020-04-24 DIAGNOSIS — D649 Anemia, unspecified: Secondary | ICD-10-CM | POA: Diagnosis not present

## 2020-04-24 DIAGNOSIS — Z79899 Other long term (current) drug therapy: Secondary | ICD-10-CM | POA: Diagnosis not present

## 2020-04-24 DIAGNOSIS — M1990 Unspecified osteoarthritis, unspecified site: Secondary | ICD-10-CM | POA: Diagnosis present

## 2020-04-24 DIAGNOSIS — I10 Essential (primary) hypertension: Secondary | ICD-10-CM | POA: Diagnosis not present

## 2020-04-24 DIAGNOSIS — D509 Iron deficiency anemia, unspecified: Secondary | ICD-10-CM | POA: Diagnosis not present

## 2020-04-24 DIAGNOSIS — Z7982 Long term (current) use of aspirin: Secondary | ICD-10-CM

## 2020-04-24 DIAGNOSIS — E1140 Type 2 diabetes mellitus with diabetic neuropathy, unspecified: Secondary | ICD-10-CM | POA: Diagnosis present

## 2020-04-24 DIAGNOSIS — Z20822 Contact with and (suspected) exposure to covid-19: Secondary | ICD-10-CM | POA: Diagnosis present

## 2020-04-24 DIAGNOSIS — R1032 Left lower quadrant pain: Secondary | ICD-10-CM | POA: Diagnosis not present

## 2020-04-24 DIAGNOSIS — E785 Hyperlipidemia, unspecified: Secondary | ICD-10-CM | POA: Diagnosis present

## 2020-04-24 DIAGNOSIS — K219 Gastro-esophageal reflux disease without esophagitis: Secondary | ICD-10-CM | POA: Diagnosis present

## 2020-04-24 DIAGNOSIS — I739 Peripheral vascular disease, unspecified: Secondary | ICD-10-CM | POA: Diagnosis present

## 2020-04-24 DIAGNOSIS — Z7984 Long term (current) use of oral hypoglycemic drugs: Secondary | ICD-10-CM | POA: Diagnosis not present

## 2020-04-24 DIAGNOSIS — E1151 Type 2 diabetes mellitus with diabetic peripheral angiopathy without gangrene: Secondary | ICD-10-CM | POA: Diagnosis not present

## 2020-04-24 DIAGNOSIS — I70221 Atherosclerosis of native arteries of extremities with rest pain, right leg: Secondary | ICD-10-CM | POA: Diagnosis present

## 2020-04-24 DIAGNOSIS — Z79891 Long term (current) use of opiate analgesic: Secondary | ICD-10-CM

## 2020-04-24 DIAGNOSIS — Z7902 Long term (current) use of antithrombotics/antiplatelets: Secondary | ICD-10-CM | POA: Diagnosis not present

## 2020-04-24 DIAGNOSIS — I779 Disorder of arteries and arterioles, unspecified: Secondary | ICD-10-CM | POA: Diagnosis present

## 2020-04-24 HISTORY — PX: PATCH ANGIOPLASTY: SHX6230

## 2020-04-24 HISTORY — PX: ENDARTERECTOMY FEMORAL: SHX5804

## 2020-04-24 HISTORY — DX: Presence of spectacles and contact lenses: Z97.3

## 2020-04-24 LAB — URINALYSIS, ROUTINE W REFLEX MICROSCOPIC
Bilirubin Urine: NEGATIVE
Glucose, UA: NEGATIVE mg/dL
Hgb urine dipstick: NEGATIVE
Ketones, ur: NEGATIVE mg/dL
Nitrite: POSITIVE — AB
Protein, ur: NEGATIVE mg/dL
Specific Gravity, Urine: 1.019 (ref 1.005–1.030)
pH: 5 (ref 5.0–8.0)

## 2020-04-24 LAB — POCT I-STAT 7, (LYTES, BLD GAS, ICA,H+H)
Acid-Base Excess: 3 mmol/L — ABNORMAL HIGH (ref 0.0–2.0)
Bicarbonate: 28.1 mmol/L — ABNORMAL HIGH (ref 20.0–28.0)
Calcium, Ion: 1.19 mmol/L (ref 1.15–1.40)
HCT: 25 % — ABNORMAL LOW (ref 39.0–52.0)
Hemoglobin: 8.5 g/dL — ABNORMAL LOW (ref 13.0–17.0)
O2 Saturation: 100 %
Patient temperature: 35.9
Potassium: 3.8 mmol/L (ref 3.5–5.1)
Sodium: 137 mmol/L (ref 135–145)
TCO2: 29 mmol/L (ref 22–32)
pCO2 arterial: 41.9 mmHg (ref 32.0–48.0)
pH, Arterial: 7.431 (ref 7.350–7.450)
pO2, Arterial: 291 mmHg — ABNORMAL HIGH (ref 83.0–108.0)

## 2020-04-24 LAB — CBC
HCT: 29.5 % — ABNORMAL LOW (ref 39.0–52.0)
Hemoglobin: 7.7 g/dL — ABNORMAL LOW (ref 13.0–17.0)
MCH: 15.9 pg — ABNORMAL LOW (ref 26.0–34.0)
MCHC: 26.1 g/dL — ABNORMAL LOW (ref 30.0–36.0)
MCV: 60.8 fL — ABNORMAL LOW (ref 80.0–100.0)
Platelets: 319 10*3/uL (ref 150–400)
RBC: 4.85 MIL/uL (ref 4.22–5.81)
RDW: 25 % — ABNORMAL HIGH (ref 11.5–15.5)
WBC: 5.4 10*3/uL (ref 4.0–10.5)
nRBC: 0 % (ref 0.0–0.2)

## 2020-04-24 LAB — POCT I-STAT, CHEM 8
BUN: 8 mg/dL (ref 8–23)
Calcium, Ion: 1.2 mmol/L (ref 1.15–1.40)
Chloride: 101 mmol/L (ref 98–111)
Creatinine, Ser: 0.6 mg/dL — ABNORMAL LOW (ref 0.61–1.24)
Glucose, Bld: 118 mg/dL — ABNORMAL HIGH (ref 70–99)
HCT: 24 % — ABNORMAL LOW (ref 39.0–52.0)
Hemoglobin: 8.2 g/dL — ABNORMAL LOW (ref 13.0–17.0)
Potassium: 3.9 mmol/L (ref 3.5–5.1)
Sodium: 136 mmol/L (ref 135–145)
TCO2: 28 mmol/L (ref 22–32)

## 2020-04-24 LAB — COMPREHENSIVE METABOLIC PANEL
ALT: 12 U/L (ref 0–44)
AST: 19 U/L (ref 15–41)
Albumin: 3.9 g/dL (ref 3.5–5.0)
Alkaline Phosphatase: 49 U/L (ref 38–126)
Anion gap: 9 (ref 5–15)
BUN: 10 mg/dL (ref 8–23)
CO2: 26 mmol/L (ref 22–32)
Calcium: 9.1 mg/dL (ref 8.9–10.3)
Chloride: 103 mmol/L (ref 98–111)
Creatinine, Ser: 0.93 mg/dL (ref 0.61–1.24)
GFR calc Af Amer: >60 mL/min (ref >60)
GFR calc non Af Amer: >60 mL/min (ref >60)
Glucose, Bld: 109 mg/dL — ABNORMAL HIGH (ref 70–99)
Potassium: 3.8 mmol/L (ref 3.5–5.1)
Sodium: 138 mmol/L (ref 135–145)
Total Bilirubin: 0.6 mg/dL (ref 0.3–1.2)
Total Protein: 7.2 g/dL (ref 6.5–8.1)

## 2020-04-24 LAB — APTT: aPTT: 34 seconds (ref 24–36)

## 2020-04-24 LAB — GLUCOSE, CAPILLARY
Glucose-Capillary: 108 mg/dL — ABNORMAL HIGH (ref 70–99)
Glucose-Capillary: 127 mg/dL — ABNORMAL HIGH (ref 70–99)

## 2020-04-24 LAB — POCT ACTIVATED CLOTTING TIME: Activated Clotting Time: 224 seconds

## 2020-04-24 LAB — PROTIME-INR
INR: 1.1 (ref 0.8–1.2)
Prothrombin Time: 13.8 seconds (ref 11.4–15.2)

## 2020-04-24 LAB — PREPARE RBC (CROSSMATCH)

## 2020-04-24 SURGERY — ENDARTERECTOMY, FEMORAL
Anesthesia: General | Site: Groin | Laterality: Right

## 2020-04-24 MED ORDER — OXYCODONE HCL 5 MG PO TABS: 5.0000 mg | ORAL_TABLET | Freq: Once | ORAL | Status: DC | PRN

## 2020-04-24 MED ORDER — LACTATED RINGERS IV SOLN: INTRAVENOUS | Status: DC | PRN

## 2020-04-24 MED ORDER — NEBIVOLOL HCL 10 MG PO TABS
20.0000 mg | ORAL_TABLET | Freq: Every day | ORAL | Status: DC
  Administered 2020-04-25 – 2020-04-27 (×3): 20 mg via ORAL
  Filled 2020-04-24 (×3): qty 2

## 2020-04-24 MED ORDER — CHLORHEXIDINE GLUCONATE CLOTH 2 % EX PADS: 6.0000 | MEDICATED_PAD | Freq: Once | CUTANEOUS | Status: DC

## 2020-04-24 MED ORDER — POLYETHYLENE GLYCOL 3350 17 G PO PACK: 17.0000 g | PACK | Freq: Every day | ORAL | Status: DC | PRN

## 2020-04-24 MED ORDER — OXYCODONE HCL 5 MG/5ML PO SOLN: 5.0000 mg | Freq: Once | ORAL | Status: DC | PRN

## 2020-04-24 MED ORDER — ONDANSETRON HCL 4 MG/2ML IJ SOLN: 4.0000 mg | Freq: Four times a day (QID) | INTRAMUSCULAR | Status: DC | PRN

## 2020-04-24 MED ORDER — CEFAZOLIN SODIUM 1 G IJ SOLR
INTRAMUSCULAR | Status: AC
  Filled 2020-04-24: qty 20

## 2020-04-24 MED ORDER — LIDOCAINE 2% (20 MG/ML) 5 ML SYRINGE
INTRAMUSCULAR | Status: DC | PRN
  Administered 2020-04-24: 60 mg via INTRAVENOUS

## 2020-04-24 MED ORDER — SODIUM CHLORIDE 0.9 % IV SOLN
INTRAVENOUS | Status: DC | PRN
  Administered 2020-04-24: 500 mL

## 2020-04-24 MED ORDER — OXYCODONE-ACETAMINOPHEN 5-325 MG PO TABS
1.0000 | ORAL_TABLET | ORAL | Status: DC | PRN
  Administered 2020-04-24 – 2020-04-26 (×9): 2 via ORAL
  Filled 2020-04-24 (×10): qty 2

## 2020-04-24 MED ORDER — HEPARIN SODIUM (PORCINE) 1000 UNIT/ML IJ SOLN
INTRAMUSCULAR | Status: AC
  Filled 2020-04-24: qty 1

## 2020-04-24 MED ORDER — PHENYLEPHRINE HCL-NACL 10-0.9 MG/250ML-% IV SOLN
INTRAVENOUS | Status: DC | PRN
  Administered 2020-04-24: 30 ug/min via INTRAVENOUS

## 2020-04-24 MED ORDER — HEPARIN SODIUM (PORCINE) 1000 UNIT/ML IJ SOLN
INTRAMUSCULAR | Status: DC | PRN
Start: 2020-04-24 — End: 2020-04-24
  Administered 2020-04-24: 3000 [IU] via INTRAVENOUS
  Administered 2020-04-24: 9000 [IU] via INTRAVENOUS

## 2020-04-24 MED ORDER — MAGNESIUM SULFATE 2 GM/50ML IV SOLN: 2.0000 g | Freq: Every day | INTRAVENOUS | Status: DC | PRN

## 2020-04-24 MED ORDER — BENAZEPRIL HCL 5 MG PO TABS: 10.0000 mg | ORAL_TABLET | Freq: Every day | ORAL | Status: DC

## 2020-04-24 MED ORDER — MORPHINE SULFATE (PF) 2 MG/ML IV SOLN
2.0000 mg | INTRAVENOUS | Status: DC | PRN
  Administered 2020-04-24 (×2): 2 mg via INTRAVENOUS
  Filled 2020-04-24 (×2): qty 1

## 2020-04-24 MED ORDER — DEXAMETHASONE SODIUM PHOSPHATE 10 MG/ML IJ SOLN
INTRAMUSCULAR | Status: AC
  Filled 2020-04-24: qty 1

## 2020-04-24 MED ORDER — SODIUM CHLORIDE 0.9 % IV SOLN
500.0000 mL | Freq: Once | INTRAVENOUS | Status: AC | PRN
  Administered 2020-04-24: 500 mL via INTRAVENOUS

## 2020-04-24 MED ORDER — HEMOSTATIC AGENTS (NO CHARGE) OPTIME
TOPICAL | Status: DC | PRN
  Administered 2020-04-24 (×2): 1 via TOPICAL

## 2020-04-24 MED ORDER — ACETAMINOPHEN 325 MG RE SUPP: 325.0000 mg | RECTAL | Status: DC | PRN

## 2020-04-24 MED ORDER — ROCURONIUM BROMIDE 10 MG/ML (PF) SYRINGE
PREFILLED_SYRINGE | INTRAVENOUS | Status: DC | PRN
  Administered 2020-04-24: 60 mg via INTRAVENOUS
  Administered 2020-04-24: 10 mg via INTRAVENOUS
  Administered 2020-04-24: 20 mg via INTRAVENOUS
  Administered 2020-04-24: 10 mg via INTRAVENOUS

## 2020-04-24 MED ORDER — ONDANSETRON HCL 4 MG/2ML IJ SOLN
INTRAMUSCULAR | Status: AC
  Filled 2020-04-24: qty 2

## 2020-04-24 MED ORDER — PROTAMINE SULFATE 10 MG/ML IV SOLN
INTRAVENOUS | Status: DC | PRN
Start: 2020-04-24 — End: 2020-04-24
  Administered 2020-04-24: 50 mg via INTRAVENOUS

## 2020-04-24 MED ORDER — ASPIRIN EC 81 MG PO TBEC
81.0000 mg | DELAYED_RELEASE_TABLET | Freq: Every day | ORAL | Status: DC
  Administered 2020-04-24 – 2020-04-27 (×4): 81 mg via ORAL
  Filled 2020-04-24 (×5): qty 1

## 2020-04-24 MED ORDER — CEFAZOLIN SODIUM-DEXTROSE 2-4 GM/100ML-% IV SOLN
2.0000 g | INTRAVENOUS | Status: AC
  Administered 2020-04-24: 2 g via INTRAVENOUS
  Filled 2020-04-24: qty 100

## 2020-04-24 MED ORDER — ACETAMINOPHEN 325 MG PO TABS: 325.0000 mg | ORAL_TABLET | ORAL | Status: DC | PRN

## 2020-04-24 MED ORDER — HYDRALAZINE HCL 20 MG/ML IJ SOLN: 5.0000 mg | INTRAMUSCULAR | Status: DC | PRN

## 2020-04-24 MED ORDER — ATORVASTATIN CALCIUM 40 MG PO TABS
40.0000 mg | ORAL_TABLET | Freq: Every day | ORAL | Status: DC
  Administered 2020-04-25 – 2020-04-27 (×3): 40 mg via ORAL
  Filled 2020-04-24 (×3): qty 1

## 2020-04-24 MED ORDER — ALUM & MAG HYDROXIDE-SIMETH 200-200-20 MG/5ML PO SUSP: 15.0000 mL | ORAL | Status: DC | PRN

## 2020-04-24 MED ORDER — POTASSIUM CHLORIDE CRYS ER 20 MEQ PO TBCR: 20.0000 meq | EXTENDED_RELEASE_TABLET | Freq: Every day | ORAL | Status: DC | PRN

## 2020-04-24 MED ORDER — FENTANYL CITRATE (PF) 100 MCG/2ML IJ SOLN
25.0000 ug | INTRAMUSCULAR | Status: DC | PRN
  Administered 2020-04-24 (×2): 25 ug via INTRAVENOUS
  Administered 2020-04-24: 50 ug via INTRAVENOUS

## 2020-04-24 MED ORDER — GABAPENTIN 300 MG PO CAPS
300.0000 mg | ORAL_CAPSULE | Freq: Every day | ORAL | Status: DC
  Administered 2020-04-24 – 2020-04-26 (×3): 300 mg via ORAL
  Filled 2020-04-24 (×3): qty 1

## 2020-04-24 MED ORDER — ROCURONIUM BROMIDE 10 MG/ML (PF) SYRINGE
PREFILLED_SYRINGE | INTRAVENOUS | Status: AC
  Filled 2020-04-24: qty 10

## 2020-04-24 MED ORDER — FLUOXETINE HCL 10 MG PO CAPS
10.0000 mg | ORAL_CAPSULE | Freq: Every day | ORAL | Status: DC
  Administered 2020-04-25 – 2020-04-26 (×2): 10 mg via ORAL
  Filled 2020-04-24 (×4): qty 1

## 2020-04-24 MED ORDER — MIDAZOLAM HCL 2 MG/2ML IJ SOLN
INTRAMUSCULAR | Status: AC
  Filled 2020-04-24: qty 2

## 2020-04-24 MED ORDER — GUAIFENESIN-DM 100-10 MG/5ML PO SYRP: 15.0000 mL | ORAL_SOLUTION | ORAL | Status: DC | PRN

## 2020-04-24 MED ORDER — SODIUM CHLORIDE 0.9 % IV SOLN
INTRAVENOUS | Status: AC
  Filled 2020-04-24: qty 1.2

## 2020-04-24 MED ORDER — SODIUM CHLORIDE 0.9 % IV SOLN: INTRAVENOUS | Status: DC

## 2020-04-24 MED ORDER — METOPROLOL TARTRATE 5 MG/5ML IV SOLN: 2.0000 mg | INTRAVENOUS | Status: DC | PRN

## 2020-04-24 MED ORDER — LABETALOL HCL 5 MG/ML IV SOLN: 10.0000 mg | INTRAVENOUS | Status: DC | PRN

## 2020-04-24 MED ORDER — HEPARIN SODIUM (PORCINE) 1000 UNIT/ML IJ SOLN
INTRAMUSCULAR | Status: AC
  Filled 2020-04-24: qty 4

## 2020-04-24 MED ORDER — ONDANSETRON HCL 4 MG/2ML IJ SOLN
INTRAMUSCULAR | Status: DC | PRN
  Administered 2020-04-24: 4 mg via INTRAVENOUS

## 2020-04-24 MED ORDER — PROTAMINE SULFATE 10 MG/ML IV SOLN
INTRAVENOUS | Status: AC
  Filled 2020-04-24: qty 15

## 2020-04-24 MED ORDER — DOCUSATE SODIUM 100 MG PO CAPS
100.0000 mg | ORAL_CAPSULE | Freq: Every day | ORAL | Status: DC
  Administered 2020-04-25 – 2020-04-27 (×3): 100 mg via ORAL
  Filled 2020-04-24 (×3): qty 1

## 2020-04-24 MED ORDER — GABAPENTIN 100 MG PO CAPS: 100.0000 mg | ORAL_CAPSULE | Freq: Every day | ORAL | Status: DC

## 2020-04-24 MED ORDER — CLOPIDOGREL BISULFATE 75 MG PO TABS
75.0000 mg | ORAL_TABLET | Freq: Every day | ORAL | Status: DC
  Administered 2020-04-25 – 2020-04-27 (×3): 75 mg via ORAL
  Filled 2020-04-24 (×3): qty 1

## 2020-04-24 MED ORDER — MIDAZOLAM HCL 2 MG/2ML IJ SOLN
INTRAMUSCULAR | Status: DC | PRN
  Administered 2020-04-24: 2 mg via INTRAVENOUS

## 2020-04-24 MED ORDER — BISACODYL 5 MG PO TBEC: 5.0000 mg | DELAYED_RELEASE_TABLET | Freq: Every day | ORAL | Status: DC | PRN

## 2020-04-24 MED ORDER — FENTANYL CITRATE (PF) 250 MCG/5ML IJ SOLN
INTRAMUSCULAR | Status: AC
  Filled 2020-04-24: qty 5

## 2020-04-24 MED ORDER — PROPOFOL 10 MG/ML IV BOLUS
INTRAVENOUS | Status: DC | PRN
  Administered 2020-04-24: 100 mg via INTRAVENOUS

## 2020-04-24 MED ORDER — CHLORHEXIDINE GLUCONATE CLOTH 2 % EX PADS
6.0000 | MEDICATED_PAD | Freq: Every day | CUTANEOUS | Status: DC
  Administered 2020-04-25: 6 via TOPICAL

## 2020-04-24 MED ORDER — CEFAZOLIN SODIUM-DEXTROSE 2-4 GM/100ML-% IV SOLN
2.0000 g | Freq: Three times a day (TID) | INTRAVENOUS | Status: AC
  Administered 2020-04-24 (×2): 2 g via INTRAVENOUS
  Filled 2020-04-24 (×2): qty 100

## 2020-04-24 MED ORDER — ESMOLOL HCL 100 MG/10ML IV SOLN
INTRAVENOUS | Status: AC
  Filled 2020-04-24: qty 10

## 2020-04-24 MED ORDER — ESMOLOL HCL 100 MG/10ML IV SOLN
INTRAVENOUS | Status: DC | PRN
Start: 2020-04-24 — End: 2020-04-24
  Administered 2020-04-24 (×2): 20 mg via INTRAVENOUS

## 2020-04-24 MED ORDER — PANTOPRAZOLE SODIUM 40 MG PO TBEC
40.0000 mg | DELAYED_RELEASE_TABLET | Freq: Every day | ORAL | Status: DC
  Administered 2020-04-25 – 2020-04-27 (×3): 40 mg via ORAL
  Filled 2020-04-24 (×3): qty 1

## 2020-04-24 MED ORDER — PROPOFOL 10 MG/ML IV BOLUS
INTRAVENOUS | Status: AC
  Filled 2020-04-24: qty 40

## 2020-04-24 MED ORDER — DEXAMETHASONE SODIUM PHOSPHATE 10 MG/ML IJ SOLN
INTRAMUSCULAR | Status: DC | PRN
Start: 2020-04-24 — End: 2020-04-24
  Administered 2020-04-24: 4 mg via INTRAVENOUS

## 2020-04-24 MED ORDER — 0.9 % SODIUM CHLORIDE (POUR BTL) OPTIME
TOPICAL | Status: DC | PRN
  Administered 2020-04-24: 1000 mL

## 2020-04-24 MED ORDER — BENAZEPRIL HCL 5 MG PO TABS
5.0000 mg | ORAL_TABLET | Freq: Every day | ORAL | Status: DC
  Administered 2020-04-25 – 2020-04-27 (×3): 5 mg via ORAL
  Filled 2020-04-24 (×3): qty 1

## 2020-04-24 MED ORDER — FENTANYL CITRATE (PF) 100 MCG/2ML IJ SOLN
INTRAMUSCULAR | Status: AC
  Filled 2020-04-24: qty 2

## 2020-04-24 MED ORDER — PHENOL 1.4 % MT LIQD: 1.0000 | OROMUCOSAL | Status: DC | PRN

## 2020-04-24 MED ORDER — ONDANSETRON HCL 4 MG/2ML IJ SOLN: 4.0000 mg | Freq: Once | INTRAMUSCULAR | Status: DC | PRN

## 2020-04-24 MED ORDER — SODIUM CHLORIDE 0.9% IV SOLUTION: Freq: Once | INTRAVENOUS | Status: DC

## 2020-04-24 MED ORDER — EPHEDRINE SULFATE-NACL 50-0.9 MG/10ML-% IV SOSY
PREFILLED_SYRINGE | INTRAVENOUS | Status: DC | PRN
  Administered 2020-04-24 (×3): 10 mg via INTRAVENOUS

## 2020-04-24 MED ORDER — FENTANYL CITRATE (PF) 100 MCG/2ML IJ SOLN
INTRAMUSCULAR | Status: DC | PRN
  Administered 2020-04-24 (×2): 50 ug via INTRAVENOUS
  Administered 2020-04-24: 100 ug via INTRAVENOUS
  Administered 2020-04-24: 50 ug via INTRAVENOUS

## 2020-04-24 MED ORDER — EZETIMIBE 10 MG PO TABS
10.0000 mg | ORAL_TABLET | Freq: Every day | ORAL | Status: DC
  Administered 2020-04-25 – 2020-04-27 (×3): 10 mg via ORAL
  Filled 2020-04-24 (×3): qty 1

## 2020-04-24 MED ORDER — METFORMIN HCL 500 MG PO TABS
1000.0000 mg | ORAL_TABLET | Freq: Two times a day (BID) | ORAL | Status: DC
  Administered 2020-04-24 – 2020-04-27 (×6): 1000 mg via ORAL
  Filled 2020-04-24 (×6): qty 2

## 2020-04-24 SURGICAL SUPPLY — 62 items
ADH SKN CLS APL DERMABOND .7 (GAUZE/BANDAGES/DRESSINGS) ×2
AGENT HMST SPONGE THK3/8 (HEMOSTASIS)
BANDAGE ESMARK 6X9 LF (GAUZE/BANDAGES/DRESSINGS) IMPLANT
BLADE CLIPPER SURG (BLADE) ×3 IMPLANT
BNDG CMPR 9X6 STRL LF SNTH (GAUZE/BANDAGES/DRESSINGS)
BNDG ESMARK 6X9 LF (GAUZE/BANDAGES/DRESSINGS)
CANISTER SUCT 3000ML PPV (MISCELLANEOUS) ×3 IMPLANT
CLIP VESOCCLUDE MED 24/CT (CLIP) ×3 IMPLANT
CLIP VESOCCLUDE SM WIDE 24/CT (CLIP) ×3 IMPLANT
COVER PROBE W GEL 5X96 (DRAPES) ×3 IMPLANT
COVER WAND RF STERILE (DRAPES) ×1 IMPLANT
CUFF TOURN SGL QUICK 18X4 (TOURNIQUET CUFF) IMPLANT
CUFF TOURN SGL QUICK 24 (TOURNIQUET CUFF)
CUFF TOURN SGL QUICK 34 (TOURNIQUET CUFF)
CUFF TOURN SGL QUICK 42 (TOURNIQUET CUFF) IMPLANT
CUFF TRNQT CYL 24X4X16.5-23 (TOURNIQUET CUFF) IMPLANT
CUFF TRNQT CYL 34X4.125X (TOURNIQUET CUFF) IMPLANT
DERMABOND ADVANCED (GAUZE/BANDAGES/DRESSINGS) ×1
DERMABOND ADVANCED .7 DNX12 (GAUZE/BANDAGES/DRESSINGS) ×2 IMPLANT
DRAIN CHANNEL 15F RND FF W/TCR (WOUND CARE) IMPLANT
DRAPE C-ARM 42X72 X-RAY (DRAPES) IMPLANT
ELECT REM PT RETURN 9FT ADLT (ELECTROSURGICAL) ×3
ELECTRODE REM PT RTRN 9FT ADLT (ELECTROSURGICAL) ×2 IMPLANT
EVACUATOR SILICONE 100CC (DRAIN) IMPLANT
GAUZE 4X4 16PLY RFD (DISPOSABLE) IMPLANT
GLOVE BIO SURGEON STRL SZ7.5 (GLOVE) ×3 IMPLANT
GLOVE BIOGEL PI IND STRL 8 (GLOVE) ×2 IMPLANT
GLOVE BIOGEL PI INDICATOR 8 (GLOVE) ×1
GOWN STRL REUS W/ TWL LRG LVL3 (GOWN DISPOSABLE) ×6 IMPLANT
GOWN STRL REUS W/ TWL XL LVL3 (GOWN DISPOSABLE) ×4 IMPLANT
GOWN STRL REUS W/TWL LRG LVL3 (GOWN DISPOSABLE) ×9
GOWN STRL REUS W/TWL XL LVL3 (GOWN DISPOSABLE) ×6
GRAFT VASC PATCH XENOSURE 1X14 (Vascular Products) ×2 IMPLANT
HEMOSTAT SNOW SURGICEL 2X4 (HEMOSTASIS) ×4 IMPLANT
HEMOSTAT SPONGE AVITENE ULTRA (HEMOSTASIS) IMPLANT
INSERT FOGARTY SM (MISCELLANEOUS) IMPLANT
KIT BASIN OR (CUSTOM PROCEDURE TRAY) ×3 IMPLANT
KIT TURNOVER KIT B (KITS) ×3 IMPLANT
LOOP VESSEL MINI RED (MISCELLANEOUS) ×2 IMPLANT
NS IRRIG 1000ML POUR BTL (IV SOLUTION) ×6 IMPLANT
PACK PERIPHERAL VASCULAR (CUSTOM PROCEDURE TRAY) ×3 IMPLANT
PAD ARMBOARD 7.5X6 YLW CONV (MISCELLANEOUS) ×6 IMPLANT
STAPLER VISISTAT 35W (STAPLE) IMPLANT
SUT GORETEX 5 0 TT13 24 (SUTURE) IMPLANT
SUT GORETEX 6.0 TT13 (SUTURE) IMPLANT
SUT MNCRL AB 4-0 PS2 18 (SUTURE) ×4 IMPLANT
SUT PROLENE 5 0 C 1 24 (SUTURE) ×21 IMPLANT
SUT PROLENE 6 0 BV (SUTURE) ×3 IMPLANT
SUT PROLENE 7 0 BV 1 (SUTURE) IMPLANT
SUT SILK 2 0 PERMA HAND 18 BK (SUTURE) IMPLANT
SUT SILK 2 0 SH (SUTURE) ×1 IMPLANT
SUT SILK 3 0 (SUTURE)
SUT SILK 3-0 18XBRD TIE 12 (SUTURE) IMPLANT
SUT VIC AB 2-0 CT1 27 (SUTURE) ×6
SUT VIC AB 2-0 CT1 TAPERPNT 27 (SUTURE) ×4 IMPLANT
SUT VIC AB 3-0 SH 27 (SUTURE) ×3
SUT VIC AB 3-0 SH 27X BRD (SUTURE) ×3 IMPLANT
TOWEL GREEN STERILE (TOWEL DISPOSABLE) ×3 IMPLANT
TRAY FOLEY MTR SLVR 16FR STAT (SET/KITS/TRAYS/PACK) ×3 IMPLANT
TUBING EXTENTION W/L.L. (IV SETS) ×3 IMPLANT
UNDERPAD 30X30 (UNDERPADS AND DIAPERS) ×3 IMPLANT
WATER STERILE IRR 1000ML POUR (IV SOLUTION) ×3 IMPLANT

## 2020-04-24 NOTE — Op Note (Signed)
Date: April 24, 2020  Preoperative diagnosis: Critical limb ischemia of the right lower extremity with rest pain  Postoperative diagnosis: Same  Procedure: Right common femoral artery endarterectomy with profundoplasty and bovine pericardial patch angioplasty  Surgeon: Dr. Marty Heck, MD  Assistant: Marlinda Mike, PA  Indications: Patient is a 69 year old male well-known to the vascular surgery service who has had multiple interventions in the left lower extremity.  He now presents with new rest pain in the right leg and has known chronic SFA occlusion and previous right external iliac stent.  Ultimately arteriogram was performed earlier in the week and he has a high-grade right common femoral stenosis with disease into the profunda.  He presents today for planned femoral endarterectomy after risk benefits discussed.  Findings: Patient had near occlusive large calcified plaque in the distal right common femoral artery with heavy disease into the profunda.  Ultimately the common femoral was endarterectomized into the profunda and a large bovine pericardial patch was sewn.  There was excellent flow in the profunda upon completion with a palpable femoral pulse.  Anesthesia: General  EBL: 100 mL  Details: Patient was taken to the operating room after informed consent was obtained.  Placed on operative table in supine position.  General endotracheal anesthesia induced.  The right leg and groin were then prepped draped usual sterile fashion.  Preoperative antibiotics were given.  Initially made a vertical groin incision over his very weak right femoral pulse.  Dissected down with Bovie cautery and the subcutaneous tissue femoral sheath were opened.  Ultimately when I encountered the common femoral dissected under the inguinal ligament and got control of the circumflex branches as well as the distal external iliac artery and found a softer spot for clamp placement.  Dissected down and  ultimately the SFA and profunda were dissected out controlled with Vesseloops.  The profunda felt very hard and calcified circumferentially so ultimately I ligated the crossing profunda vein with 3-0 silk ties and divided it and ultimately dissected out the profunda for about 5 to 6 cm until it bifurcated and was fairly soft.  Vesseloops were placed here for control.  Patient was given 100 units/kg heparin.  After that circulated for 2 minutes, I used Vesseloops to control the distal profunda branches and Henley clamp on the distal external iliac.  Common femoral was opened with 11 blade scalpel and extend the arteriotomy with Potts scissors onto the profunda.  Endarterectomy was then performed with [facility] and I get a nice plane proximally in the distal external iliac and I carried this down onto the profunda distally where it bifurcated 5 to 6 cm distally on the artery.  Once I was happy with my endarterectomy plane it was flushed with heparinized saline.  A large bovine pericardial patch was brought on the field initially sewed the distal patch onto the distal profunda with a 5-0 Prolene parachute technique and the patch was trimmed accordingly and also used a 5-0 Prolene parachute technique proximally.  Everything was de-aired prior to completion and we had good backbleeding from the profunda.  Once we completed the patch and came off clamps there was good hemostasis.  We used Surgicel snow and protamine for reversal.  There was excellent Doppler flow in the profunda branches.  Patient had doppler signals in the right foot.  Ultimately the wound was then irrigated and closed with multiple layers of 2-0 Vicryl, 3-0 Vicryl, 4-0 Monocryl and Dermabond.  Complication: None  Condition: Stable  Gwenyth Allegra.  Carlis Abbott, MD [facility] [place] Office: [place]

## 2020-04-24 NOTE — Anesthesia Postprocedure Evaluation (Signed)
Anesthesia Post Note  Patient: NYLAN HULL  Procedure(s) Performed: RIGHT FEMORAL ENDARTERECTOMY (Right Groin) Patch Angioplasty of Right Femoral Artery using Long Xenosure Biologic Patch 1cm x 14 cm (Right Groin)     Patient location during evaluation: PACU Anesthesia Type: General Level of consciousness: awake and alert Pain management: pain level controlled Vital Signs Assessment: post-procedure vital signs reviewed and stable Respiratory status: spontaneous breathing, nonlabored ventilation and respiratory function stable Cardiovascular status: blood pressure returned to baseline and stable Postop Assessment: no apparent nausea or vomiting Anesthetic complications: no    Last Vitals:  Vitals:   04/24/20 1121 04/24/20 1136  BP: (!) 121/57 (!) 118/54  Pulse: 60 61  Resp: 16 15  Temp:    SpO2: 100% 100%    Last Pain:  Vitals:   04/24/20 1136  TempSrc:   PainSc: [place]

## 2020-04-24 NOTE — Anesthesia Procedure Notes (Signed)
Procedure Name: Intubation Date/Time: 04/24/2020 7:42 AM Performed by: Leonor Liv, CRNA Pre-anesthesia Checklist: Patient identified, Emergency Drugs available, Suction available and Patient being monitored Patient Re-evaluated:Patient Re-evaluated prior to induction Oxygen Delivery Method: Circle System Utilized Preoxygenation: Pre-oxygenation with 100% oxygen Induction Type: IV induction Ventilation: Mask ventilation without difficulty Laryngoscope Size: Mac and 4 Grade View: Grade III Tube type: Oral Tube size: 7.5 mm Number of attempts: 1 Airway Equipment and Method: Stylet and Oral airway Placement Confirmation: ETT inserted through vocal cords under direct vision,  positive ETCO2 and breath sounds checked- equal and bilateral Secured at: 22 cm Tube secured with: Tape Dental Injury: Teeth and Oropharynx as per pre-operative assessment

## 2020-04-24 NOTE — Transfer of Care (Signed)
Immediate Anesthesia Transfer of Care Note  Patient: GRAEME HIATT  Procedure(s) Performed: RIGHT FEMORAL ENDARTERECTOMY (Right Groin) Patch Angioplasty of Right Femoral Artery using Long Xenosure Biologic Patch 1cm x 14 cm (Right Groin)  Patient Location: PACU  Anesthesia Type:General  Level of Consciousness: awake, alert  and oriented  Airway & Oxygen Therapy: Patient Spontanous Breathing and Patient connected to nasal cannula oxygen  Post-op Assessment: Report given to RN, Post -op Vital signs reviewed and stable and Patient moving all extremities  Post vital signs: Reviewed and stable  Last Vitals:  Vitals Value Taken Time  BP 104/51 04/24/20 1036  Temp 36.8 C 04/24/20 1036  Pulse 66 04/24/20 1045  Resp 22 04/24/20 1045  SpO2 100 % 04/24/20 1045  Vitals shown include unvalidated device data.  Last Pain:  Vitals:   04/24/20 1036  TempSrc:   PainSc: Asleep         Complications: No apparent anesthesia complications

## 2020-04-24 NOTE — H&P (Signed)
History and Physical Interval Note:  04/24/2020 7:15 AM  [name]  has presented today for surgery, with the diagnosis of PERIPHERAL ARTERY DISEASE.  The various methods of treatment have been discussed with the patient and family. After consideration of risks, benefits and other options for treatment, the patient has consented to  Procedure(s): ENDARTERECTOMY FEMORAL (Right) BYPASS GRAFT FEMORAL-PERONEAL (Right) as a surgical intervention.  The patient's history has been reviewed, patient examined, no change in status, stable for surgery.  I have reviewed the patient's chart and labs.  Questions were answered to the patient's satisfaction.    Right femoral endarterectomy, possible bypass.  [name]  Established Critical Limb Ischemia Patient  History of Present Illness  [name] is a 69 y.o. (September 13, 1951) male who presents to go over vascular studies related to peripheral arterial disease. Surgical history significant for right common iliac artery VBX stent graft by Dr. Scot Dock in 2018 patient also had left femoral to popliteal vein bypass by Dr. Kellie Simmering, which was then redone with PTFE in 2017 by Dr. Scot Dock. Most recently patient underwent emergent thrombectomy of left lower extremity bypass by Dr. Carlis Abbott in January of this year. Patient states he does not have any claudication, rest pain, or tissue changes of left lower extremity. All incisions of left leg are well-healed. Patient however states for the past month and a half he has had worsening rest pain in right foot. Lately he has been having to get up and walk around at night due to rest pain. He denies any wounds of right foot. He is taking aspirin statin daily. Patient states he smokes a cigar every other day.        Current Outpatient Medications  Medication Sig Dispense Refill  . aspirin EC 81 MG EC tablet Take 1 tablet (81 mg total) by mouth daily.    Marland Kitchen atorvastatin (LIPITOR) 40 MG tablet TAKE 1 TABLET BY MOUTH  DAILY 90 tablet 2  . benazepril (LOTENSIN) 10 MG tablet TAKE 1 TABLET BY MOUTH EVERY DAY 90 tablet 1  . benazepril (LOTENSIN) 5 MG tablet Take 1 tablet (5 mg total) by mouth daily. 90 tablet 0  . BYSTOLIC 20 MG TABS TAKE 1 TABLET BY MOUTH EVERY DAY (Patient taking differently: Take 20 mg by mouth daily. ) 90 tablet 1  . clopidogrel (PLAVIX) 75 MG tablet Take 1 tablet (75 mg total) by mouth daily. 90 tablet 1  . [employer] ACETAMINOPHEN EX ST 500 MG tablet TAKE 2 TABLETS (1,000 MG TOTAL) BY MOUTH EVERY 8 (EIGHT) HOURS AS NEEDED FOR MODERATE PAIN. (Patient taking differently: Take 1,000 mg by mouth every 8 (eight) hours as needed for moderate pain. ) 90 tablet 0  . ezetimibe (ZETIA) 10 MG tablet TAKE 1 TABLET BY MOUTH ONCE DAILY 90 tablet 1  . FLUoxetine (PROZAC) 10 MG capsule TAKE 1 CAPSULE BY MOUTH ONCE DAILY (Patient taking differently: Take 10 mg by mouth daily. ) 90 capsule 2  . gabapentin (NEURONTIN) 100 MG capsule Take 1 capsule (100 mg total) by mouth at bedtime. 90 capsule 1  . gabapentin (NEURONTIN) 300 MG capsule Take 1 capsule (300 mg total) by mouth at bedtime. 90 capsule 0  . metFORMIN (GLUCOPHAGE) 1000 MG tablet Take 1 tablet (1,000 mg total) by mouth 2 (two) times daily with a meal. 180 tablet 1  . pantoprazole (PROTONIX) 40 MG tablet TAKE 1 TABLET BY MOUTH DAILY 60 tablet 0  . ranitidine (ZANTAC 75) 75 MG tablet Take 1  tablet (75 mg total) by mouth 2 (two) times daily as needed for heartburn (heartburn, acid reflux). 30 tablet 0  . traMADol (ULTRAM) 50 MG tablet Take 1 tablet (50 mg total) by mouth every 6 (six) hours as needed. 25 tablet 0   No current facility-administered medications for this visit.   REVIEW OF SYSTEMS (negative unless checked):  Cardiac:  ? Chest pain or chest pressure?  ? Shortness of breath upon activity?  ? Shortness of breath when lying flat?  ? Irregular heart rhythm?  Vascular:  ? Pain in calf, thigh, or hip brought on by walking?  ? Pain in feet at night  that wakes you up from your sleep?  ? Blood clot in your veins?  ? Leg swelling?  Pulmonary:  ? Oxygen at home?  ? Productive cough?  ? Wheezing?  Neurologic:  ? Sudden weakness in arms or legs?  ? Sudden numbness in arms or legs?  ? Sudden onset of difficult speaking or slurred speech?  ? Temporary loss of vision in one eye?  ? Problems with dizziness?  Gastrointestinal:  ? Blood in stool?  ? Vomited blood?  Genitourinary:  ? Burning when urinating?  ? Blood in urine?  Psychiatric:  ? Major depression  Hematologic:  ? Bleeding problems?  ? Problems with blood clotting?  Dermatologic:  ? Rashes or ulcers?  Constitutional:  ? Fever or chills?  Ear/Nose/Throat:  ? Change in hearing?  ? Nose bleeds?  ? Sore throat?  Musculoskeletal:  ? Back pain?  ? Joint pain?  ? Muscle pain?  Physical Examination      Vitals:   04/15/20 1016  BP: 97/62  Pulse: 70  Resp: 16  Temp: (!) 97.2 F (36.2 C)  SpO2: 98%  Weight: 181 lb 1.6 oz (82.1 kg)  Height: 5\' 7"  (1.702 m)   Body mass index is 28.36 kg/m.  General: WDWN in NAD; vital signs documented above  Gait: Not observed  HENT: WNL, normocephalic  Pulmonary: normal non-labored breathing , without Rales, rhonchi, wheezing  Cardiac: regular HR  Abdomen: soft, NT, no masses  Skin: without rashes  Vascular Exam/Pulses:   Right Left  Radial 2+ (normal) 2+ (normal)  Femoral 1+ (weak) 2+ (normal)  Popliteal absent absent  DP absent absent  PT absent absent  Extremities: without ischemic changes, without Gangrene , without cellulitis; without open wounds;  Musculoskeletal: no muscle wasting or atrophy  Neurologic: A&O X 3; No focal weakness or paresthesias are detected  Psychiatric: The pt has Normal affect.  Non-Invasive Vascular Imaging  Patent right iliac system with peak systolic velocity of [id number] and mid right common iliac artery  50 to 75% stenosis of right common femoral artery  75 to 99% stenosis of right profunda   Right SFA and popliteal occluded  Left femoral to peroneal bypass patent  Medical Decision Making  [name] is a 69 y.o. male who presents for left lower extremity graft surveillance; also with 6-week history of right foot rest pain  Bypass duplex of left lower extremity demonstrates a widely patent bypass without any areas of hemodynamic significant stenosis  Aortoiliac duplex demonstrates a patent right common iliac artery stent  Patient has worsening right foot rest pain keeping him up at night in the presence of an occluded SFA and popliteal as well as stenosis noted in common femoral artery and profunda  Plan will be for aortogram with right lower extremity runoff and possible intervention with Dr. Carlis Abbott on Wednesday,  04/22/2020  Risks and benefits of angiography were discussed with the patient and he agrees to proceed Dagoberto Ligas PA-C  [facility]  Office: [phone]  Clinic MD: Oneida Alar

## 2020-04-24 NOTE — Progress Notes (Addendum)
   04/24/20 2021  Vitals  BP (!) 78/53 (PRN bolus given)  MAP (mmHg) (!) 62   2000 BP check noted to be low- pt assessed and is asymptomatic. PRN 513mL bolus given- will reassess when bolus complete.  BP 104/59 after bolus. Will continue to monitor.

## 2020-04-24 NOTE — Progress Notes (Signed)
04/24/2020 1210 Received pt to room 4E07 from PACU.  Pt is A&O, with C/O pain in R groin.  R groin site is WNL.  Tele monitor applied and CCMD notified.  CHG bath given.  Oriented to room, call bell and bed.  Call bell in reach.   Carney Corners

## 2020-04-24 NOTE — Anesthesia Procedure Notes (Signed)
Arterial Line Insertion Start/End4/30/2021 7:00 AM, 04/24/2020 7:10 AM Performed by: Wilburn Cornelia, CRNA, CRNA  Patient location: Pre-op. Preanesthetic checklist: patient identified, IV checked, site marked, risks and benefits discussed, surgical consent, monitors and equipment checked, pre-op evaluation, timeout performed and anesthesia consent Lidocaine 1% used for infiltration Right, radial was placed Catheter size: 20 G Hand hygiene performed  and maximum sterile barriers used  Allen's test indicative of satisfactory collateral circulation Attempts: 1 Procedure performed without using ultrasound guided technique. Following insertion, dressing applied and Biopatch. Post procedure assessment: normal  Patient tolerated the procedure well with no immediate complications.

## 2020-04-24 NOTE — Progress Notes (Signed)
  Day of Surgery Note    Subjective:  Complaining of left groin pain. Hungry.   Vitals:   04/24/20 1150 04/24/20 1215  BP:  102/66  Pulse: 64 63  Resp: 16 14  Temp: 98 F (36.7 C) 98.5 F (36.9 C)  SpO2: 96% 99%    Incisions:   Right groin well approximated. No bleeding or signs of hematoma Extremities:  Right foot warm with AROM. Brisk DP and Pt Doppler signals Cardiac:  RRR Lungs:  CTA bil Abdomen:  soft Foley in place with clear yellow urine  Assessment/Plan:  This is a 69 y.o. male who is s/p left femoral endarterectomy with bovine pericardium patch.   - Risa Grill, PA-C 04/24/2020 2:23 PM [phone]

## 2020-04-25 LAB — BASIC METABOLIC PANEL
Anion gap: 9 (ref 5–15)
BUN: 7 mg/dL — ABNORMAL LOW (ref 8–23)
CO2: 22 mmol/L (ref 22–32)
Calcium: 7.7 mg/dL — ABNORMAL LOW (ref 8.9–10.3)
Chloride: 107 mmol/L (ref 98–111)
Creatinine, Ser: 0.88 mg/dL (ref 0.61–1.24)
GFR calc Af Amer: >60 mL/min (ref >60)
GFR calc non Af Amer: >60 mL/min (ref >60)
Glucose, Bld: 176 mg/dL — ABNORMAL HIGH (ref 70–99)
Potassium: 3.7 mmol/L (ref 3.5–5.1)
Sodium: 138 mmol/L (ref 135–145)

## 2020-04-25 LAB — CBC
HCT: 20.7 % — ABNORMAL LOW (ref 39.0–52.0)
Hemoglobin: 5.6 g/dL — CL (ref 13.0–17.0)
MCH: 16.2 pg — ABNORMAL LOW (ref 26.0–34.0)
MCHC: 27.1 g/dL — ABNORMAL LOW (ref 30.0–36.0)
MCV: 60 fL — ABNORMAL LOW (ref 80.0–100.0)
Platelets: 250 10*3/uL (ref 150–400)
RBC: 3.45 MIL/uL — ABNORMAL LOW (ref 4.22–5.81)
RDW: 24.6 % — ABNORMAL HIGH (ref 11.5–15.5)
WBC: 8.3 10*3/uL (ref 4.0–10.5)
nRBC: 0 % (ref 0.0–0.2)

## 2020-04-25 LAB — PREPARE RBC (CROSSMATCH)

## 2020-04-25 LAB — HEMOGLOBIN AND HEMATOCRIT, BLOOD
HCT: 30.9 % — ABNORMAL LOW (ref 39.0–52.0)
Hemoglobin: 8.9 g/dL — ABNORMAL LOW (ref 13.0–17.0)

## 2020-04-25 NOTE — Progress Notes (Signed)
OT Cancellation Note  Patient Details Name: [name] MRN: [id number] DOB: 12-09-1951   Cancelled Treatment:    Reason Eval/Treat Not Completed: Medical issues which prohibited therapy(HGB level). Will follow.   [name] 04/25/2020, 12:27 PM

## 2020-04-25 NOTE — Progress Notes (Signed)
Progress Note    04/25/2020 8:27 AM 1 Day Post-Op  Subjective: Alert this morning of critical value hemoglobin.  Nursing staff has noted no change in patient's vital signs or evidence of any bleeding.  He had evidence of chronic anemia preoperatively.  Foley has been discontinued he has not yet voided spontaneously.  He is tolerating his diet without nausea or vomiting.  No chest pain or shortness of breath  Status post Right common femoral endarterectomy with bovine pericardial patch angioplasty  Vitals:   04/24/20 2308 04/25/20 0424  BP: (!) 108/52 100/65  Pulse: (!) 59 70  Resp: 15 14  Temp: 98 F (36.7 C) 98.7 F (37.1 C)  SpO2: 100% 100%    Physical Exam: Cardiac: Rate and rhythm are regular Lungs: Nonlabored Extremities: Both feet are warm and well perfused.  Brisk peroneal and PT Doppler signals.  Positive dorsalis pedis Doppler signal.   CBC    Component Value Date/Time   WBC 8.3 04/25/2020 0504   RBC 3.45 (L) 04/25/2020 0504   HGB 5.6 (LL) 04/25/2020 0504   HGB 9.2 (L) 04/05/2018 1030   HCT 20.7 (L) 04/25/2020 0504   HCT 32.0 (L) 04/05/2018 1030   PLT 250 04/25/2020 0504   PLT 316 04/05/2018 1030   MCV 60.0 (L) 04/25/2020 0504   MCV 65 (L) 04/05/2018 1030   MCH 16.2 (L) 04/25/2020 0504   MCHC 27.1 (L) 04/25/2020 0504   RDW 24.6 (H) 04/25/2020 0504   RDW 30.3 (H) 04/05/2018 1030   LYMPHSABS 1.8 12/19/2019 0935   LYMPHSABS 2.4 03/26/2018 1548   MONOABS 0.7 12/19/2019 0935   EOSABS 0.1 12/19/2019 0935   EOSABS 0.2 03/26/2018 1548   BASOSABS 0.0 12/19/2019 0935   BASOSABS 0.0 03/26/2018 1548    BMET    Component Value Date/Time   NA 138 04/25/2020 0504   K 3.7 04/25/2020 0504   CL 107 04/25/2020 0504   CO2 22 04/25/2020 0504   GLUCOSE 176 (H) 04/25/2020 0504   BUN 7 (L) 04/25/2020 0504   CREATININE 0.88 04/25/2020 0504   CREATININE 0.94 03/31/2015 1454   CALCIUM 7.7 (L) 04/25/2020 0504   GFRNONAA >60 04/25/2020 0504   GFRAA >60 04/25/2020 0504       Intake/Output Summary (Last 24 hours) at 04/25/2020 0827 Last data filed at 04/25/2020 0425 Gross per 24 hour  Intake 3831.82 ml  Output 2085 ml  Net 1746.82 ml    HOSPITAL MEDICATIONS Scheduled Meds: . sodium chloride   Intravenous Once  . aspirin EC  81 mg Oral Daily  . atorvastatin  40 mg Oral Daily  . benazepril  5 mg Oral Daily  . Chlorhexidine Gluconate Cloth  6 each Topical Daily  . clopidogrel  75 mg Oral Daily  . docusate sodium  100 mg Oral Daily  . ezetimibe  10 mg Oral Daily  . FLUoxetine  10 mg Oral Daily  . gabapentin  300 mg Oral QHS  . metFORMIN  1,000 mg Oral BID WC  . nebivolol  20 mg Oral Daily  . pantoprazole  40 mg Oral QAC breakfast   Continuous Infusions: . sodium chloride Stopped (04/25/20 0530)  . magnesium sulfate bolus IVPB     PRN Meds:.acetaminophen **OR** acetaminophen, alum & mag hydroxide-simeth, bisacodyl, guaiFENesin-dextromethorphan, hydrALAZINE, labetalol, magnesium sulfate bolus IVPB, metoprolol tartrate, morphine injection, ondansetron, oxyCODONE-acetaminophen, phenol, polyethylene glycol, potassium chloride  Assessment:  69 y.o. male is s/p: Right common femoral endarterectomy.  Patient with chronic anemia with critical hemoglobin this  morning.    1 Day Post-Op  Plan: -We will plan transfusion of 2 units PRBCs.  -DVT prophylaxis: SCDs   Risa Grill, PA-C [facility] [phone] 04/25/2020  8:27 AM

## 2020-04-25 NOTE — Progress Notes (Signed)
PT Cancellation Note  Patient Details Name: [name] MRN: [id number] DOB: March 19, 1951   Cancelled Treatment:    Reason Eval/Treat Not Completed: Medical issues which prohibited therapy. Hgb 5.1 and will re-attempt at another time.     Ramond Dial 04/25/2020, 9:31 AM   Mee Hives, PT MS Acute Rehab Dept. Number: [place] and [place]

## 2020-04-25 NOTE — Plan of Care (Signed)
Continue to monitor

## 2020-04-25 NOTE — Progress Notes (Signed)
CRITICAL VALUE ALERT  Critical Value: Hgb 5.6  Date & Time Notied:  04/25/20 0630 (by charge nurse)  Provider Notified: n/a  Orders Received/Actions taken: will report to rounding PA/MD. Pt is asymptomatic w/ no sign of bleed.

## 2020-04-25 NOTE — Progress Notes (Signed)
[facility] [place]  Subjective  -states his right foot rest pain feels much better.   Objective 118/61 (!) 57 98.4 F (36.9 C) (Oral) 18 91%  Intake/Output Summary (Last 24 hours) at 04/25/2020 1016 Last data filed at 04/25/2020 0425 Gross per 24 hour  Intake 2631.82 ml  Output 1850 ml  Net 781.82 ml    Right groin is clean dry and intact Right peroneal brisk by Doppler and also good dorsalis pedis and posterior tibial signals Foot is warm  Laboratory Lab Results: Recent Labs    04/24/20 0554 04/24/20 0901 04/24/20 0950 04/25/20 0504  WBC 5.4  --   --  8.3  HGB 7.7*   < > 8.2* 5.6*  HCT 29.5*   < > 24.0* 20.7*  PLT 319  --   --  250   < > = values in this interval not displayed.   BMET Recent Labs    04/24/20 0554 04/24/20 0901 04/24/20 0950 04/25/20 0504  NA 138   < > 136 138  K 3.8   < > 3.9 3.7  CL 103   < > 101 107  CO2 26  --   --  22  GLUCOSE 109*   < > 118* 176*  BUN 10   < > 8 7*  CREATININE 0.93   < > 0.60* 0.88  CALCIUM 9.1  --   --  7.7*   < > = values in this interval not displayed.    COAG Lab Results  Component Value Date   INR 1.1 04/24/2020   INR 1.1 12/19/2019   INR 1.13 04/01/2017   No results found for: PTT  Assessment/Planning:  Postop day 1 status post right femoral endarterectomy with profundoplasty and bovine pericardial patch angioplasty.  Rest pain in the right foot is significantly improved.  Hemoglobin today is 5.6 and he was anemic prior to surgery with a hemoglobin of 8.2.  There was fairly minimal blood loss in the OR only about 100 mL.  However given his anemia prior to surgery, I think it is reasonable that we will plan to transfuse him and some of this may be dilutional from volume in the OR.  OOB and mobilize.  PT etc.  Marty Heck 04/25/2020 10:16 AM --

## 2020-04-25 NOTE — Progress Notes (Signed)
PT Cancellation Note  Patient Details Name: [name] MRN: [id number] DOB: 1951-06-30   Cancelled Treatment:    Reason Eval/Treat Not Completed: Medical issues which prohibited therapy. Still waiting for completion of transfusion, will retry at another time.   Ramond Dial 04/25/2020, 12:08 PM  Mee Hives, PT [facility] Acute Re[facility] Dept. Number: [place] and [place]

## 2020-04-25 NOTE — Progress Notes (Signed)
Foley/Aline removed this AM. Pt independently able to dangle on side of bed. Will continue to monitor.

## 2020-04-26 LAB — CBC
HCT: 27.7 % — ABNORMAL LOW (ref 39.0–52.0)
Hemoglobin: 8.1 g/dL — ABNORMAL LOW (ref 13.0–17.0)
MCH: 19.3 pg — ABNORMAL LOW (ref 26.0–34.0)
MCHC: 29.2 g/dL — ABNORMAL LOW (ref 30.0–36.0)
MCV: 66.1 fL — ABNORMAL LOW (ref 80.0–100.0)
Platelets: 241 10*3/uL (ref 150–400)
RBC: 4.19 MIL/uL — ABNORMAL LOW (ref 4.22–5.81)
RDW: 29.3 % — ABNORMAL HIGH (ref 11.5–15.5)
WBC: 7.6 10*3/uL (ref 4.0–10.5)
nRBC: 0 % (ref 0.0–0.2)

## 2020-04-26 MED ORDER — OXYCODONE-ACETAMINOPHEN 5-325 MG PO TABS
2.0000 | ORAL_TABLET | ORAL | Status: DC | PRN
  Administered 2020-04-26 – 2020-04-27 (×5): 2 via ORAL
  Filled 2020-04-26 (×5): qty 2

## 2020-04-26 NOTE — Progress Notes (Addendum)
Progress Note    04/26/2020 8:37 AM 2 Days Post-Op  Subjective: Out of bed to chair eating breakfast.  He ambulated in the hallways this morning and is now complaining of right groin pain not controlled with current medication  Received 2 units of PRBCs yesterday without complications  Vitals:   04/25/20 2351 04/26/20 0438  BP: (!) 119/58 136/60  Pulse: 67 64  Resp: 18 16  Temp: 98.2 F (36.8 C) 98.3 F (36.8 C)  SpO2: 99% 98%    Physical Exam: Cardiac: Rate and rhythm are regular Lungs: Nonlabored Incisions: Right groin incision well approximated.  No drainage or edema Extremities: Right foot with active range of motion and intact sensation.  Peroneal Doppler signal is dominant. DP and PT signals are also present   CBC    Component Value Date/Time   WBC 7.6 04/26/2020 0409   RBC 4.19 (L) 04/26/2020 0409   HGB 8.1 (L) 04/26/2020 0409   HGB 9.2 (L) 04/05/2018 1030   HCT 27.7 (L) 04/26/2020 0409   HCT 32.0 (L) 04/05/2018 1030   PLT 241 04/26/2020 0409   PLT 316 04/05/2018 1030   MCV 66.1 (L) 04/26/2020 0409   MCV 65 (L) 04/05/2018 1030   MCH 19.3 (L) 04/26/2020 0409   MCHC 29.2 (L) 04/26/2020 0409   RDW 29.3 (H) 04/26/2020 0409   RDW 30.3 (H) 04/05/2018 1030   LYMPHSABS 1.8 12/19/2019 0935   LYMPHSABS 2.4 03/26/2018 1548   MONOABS 0.7 12/19/2019 0935   EOSABS 0.1 12/19/2019 0935   EOSABS 0.2 03/26/2018 1548   BASOSABS 0.0 12/19/2019 0935   BASOSABS 0.0 03/26/2018 1548    BMET    Component Value Date/Time   NA 138 04/25/2020 0504   K 3.7 04/25/2020 0504   CL 107 04/25/2020 0504   CO2 22 04/25/2020 0504   GLUCOSE 176 (H) 04/25/2020 0504   BUN 7 (L) 04/25/2020 0504   CREATININE 0.88 04/25/2020 0504   CREATININE 0.94 03/31/2015 1454   CALCIUM 7.7 (L) 04/25/2020 0504   GFRNONAA >60 04/25/2020 0504   GFRAA >60 04/25/2020 0504     Intake/Output Summary (Last 24 hours) at 04/26/2020 0837 Last data filed at 04/25/2020 2352 Gross per 24 hour  Intake 1014.67  ml  Output 650 ml  Net 364.67 ml    HOSPITAL MEDICATIONS Scheduled Meds: . sodium chloride   Intravenous Once  . aspirin EC  81 mg Oral Daily  . atorvastatin  40 mg Oral Daily  . benazepril  5 mg Oral Daily  . Chlorhexidine Gluconate Cloth  6 each Topical Daily  . clopidogrel  75 mg Oral Daily  . docusate sodium  100 mg Oral Daily  . ezetimibe  10 mg Oral Daily  . FLUoxetine  10 mg Oral Daily  . gabapentin  300 mg Oral QHS  . metFORMIN  1,000 mg Oral BID WC  . nebivolol  20 mg Oral Daily  . pantoprazole  40 mg Oral QAC breakfast   Continuous Infusions: . sodium chloride Stopped (04/25/20 0530)  . magnesium sulfate bolus IVPB     PRN Meds:.acetaminophen **OR** acetaminophen, alum & mag hydroxide-simeth, bisacodyl, guaiFENesin-dextromethorphan, hydrALAZINE, labetalol, magnesium sulfate bolus IVPB, metoprolol tartrate, morphine injection, ondansetron, oxyCODONE-acetaminophen, phenol, polyethylene glycol, potassium chloride  Assessment:  69 y.o. male is s/p: Right femoral endarterectomy with bovine pericardial patch angioplasty.  Right groin pain after ambulating.  Incision is healing without signs of infection.  Foot well perfused  Anemia, multifactorial.  Responded well to 2 units of  packed cells yesterday 2 Days Post-Op  Plan: Will increase Percocet to 10mg   -    Risa Grill, PA-C [facility] [phone] 04/26/2020  8:37 AM   I have seen and evaluated the patient. I agree with the PA note as documented above.  69 year old male now postop day 2 status post right femoral endarterectomy with profundoplasty.  Brisk Doppler signals in the right foot.  Rest pain is resolved.  He did walk with therapy this morning in the hallway.  Still having some pain control issues from the groin incision.  We will plan to hopefully discharge tomorrow after pain is adequately controlled.  Did get 2 units of blood and appropriately responded.  Groin is very stable in  appearance.  Marty Heck, MD [facility] [place] Office: [phone]

## 2020-04-26 NOTE — Evaluation (Signed)
Physical Therapy Evaluation Patient Details Name: [name] MRN: [id number] DOB: [birth year] Today's Date: 04/26/2020   History of Present Illness  69 yo admitted due to PAD with resting RLE pain s/p Rt femoral endarterectomy. PMHx: PAD, left fempop BPG, thrombectomy of LLE, HTN, DM, arthritis  Clinical Impression  Pt very pleasant and wanting to mobilize to prepare for D/C home. Pt reports intense burning pain in Rt thigh limiting all mobility. Pt educated for sequence of gait and safety and reports current distance would allow entrance into and mobility in home. Pt with decreased strength, ROM, transfers and gait who will benefit from acute therapy to maximize mobility, safety and function. Pt educated for RLE HEP and encouraged to continue as well as walking program.      Follow Up Recommendations Home health PT    Equipment Recommendations  None recommended by PT    Recommendations for Other Services       Precautions / Restrictions Precautions Precautions: Fall      Mobility  Bed Mobility Overal bed mobility: Modified Independent             General bed mobility comments: increased time to transition from supine to sit  Transfers Overall transfer level: Needs assistance   Transfers: Sit to/from Stand Sit to Stand: Min guard         General transfer comment: cues for hand placement and safety  Ambulation/Gait Ambulation/Gait assistance: Min guard Gait Distance (Feet): 88 Feet Assistive device: Rolling walker (2 wheeled) Gait Pattern/deviations: Step-to pattern;Decreased stride length;Trunk flexed   Gait velocity interpretation: 1.31 - 2.62 ft/sec, indicative of limited community ambulator General Gait Details: cues for sequence and posture to decrease pain/strain on RLE  Stairs            Wheelchair Mobility    Modified Rankin (Stroke Patients Only)       Balance Overall balance assessment: Needs assistance   Sitting balance-Leahy Scale:  Good     Standing balance support: Bilateral upper extremity supported Standing balance-Leahy Scale: Poor                               Pertinent Vitals/Pain      Home Living Family/patient expects to be discharged to:: Private residence Living Arrangements: Spouse/significant other Available Help at Discharge: Family;Available 24 hours/day Type of Home: House Home Access: Ramped entrance     Home Layout: One level Home Equipment: Cane - single point;Bedside commode;Hand held shower head;Walker - 2 wheels      Prior Function Level of Independence: Independent with assistive device(s)         Comments: has been using RW for gait since LLE thrombectomy     Hand Dominance        Extremity/Trunk Assessment   Upper Extremity Assessment Upper Extremity Assessment: Overall WFL for tasks assessed    Lower Extremity Assessment Lower Extremity Assessment: RLE deficits/detail RLE Deficits / Details: decreased hip flexion due to pain    Cervical / Trunk Assessment Cervical / Trunk Assessment: Normal  Communication   Communication: No difficulties  Cognition Arousal/Alertness: Awake/alert Behavior During Therapy: WFL for tasks assessed/performed Overall Cognitive Status: Within Functional Limits for tasks assessed                                        General Comments  Exercises General Exercises - Lower Extremity Long Arc Quad: AROM;Right;Seated;10 reps Hip Flexion/Marching: AAROM;Right;Seated;10 reps   Assessment/Plan    PT Assessment Patient needs continued PT services  PT Problem List Decreased strength;Decreased mobility;Decreased range of motion;Decreased activity tolerance;Decreased balance;Decreased knowledge of use of DME;Pain       PT Treatment Interventions DME instruction;Therapeutic exercise;Gait training;Functional mobility training;Therapeutic activities;Patient/family education    PT Goals (Current goals can  be found in the Care Plan section)  Acute Rehab PT Goals Patient Stated Goal: return to walking without pain PT Goal Formulation: With patient Time For Goal Achievement: 05/10/20 Potential to Achieve Goals: Good    Frequency Min 3X/week   Barriers to discharge        Co-evaluation               AM-PAC PT "6 Clicks" Mobility  Outcome Measure Help needed turning from your back to your side while in a flat bed without using bedrails?: A Little Help needed moving from lying on your back to sitting on the side of a flat bed without using bedrails?: A Little Help needed moving to and from a bed to a chair (including a wheelchair)?: A Little Help needed standing up from a chair using your arms (e.g., wheelchair or bedside chair)?: A Little Help needed to walk in hospital room?: A Little Help needed climbing 3-5 steps with a railing? : A Little 6 Click Score: 18    End of Session   Activity Tolerance: Patient tolerated treatment well Patient left: in chair;with call bell/phone within reach Nurse Communication: Mobility status PT Visit Diagnosis: Other abnormalities of gait and mobility (R26.89);Muscle weakness (generalized) (M62.81);Pain Pain - Right/Left: Right Pain - part of body: Leg    Time: [id number] PT Time Calculation (min) (ACUTE ONLY): 24 min   Charges:   PT Evaluation $PT Eval Moderate Complexity: 1 Mod PT Treatments $Therapeutic Activity: 8-22 mins        Tamikia Chowning P, PT Acute Rehabilitation Services Pager: [phone] Office: [place] Lillianna Sabel 04/26/2020, 12:44 PM

## 2020-04-26 NOTE — Plan of Care (Signed)
Continue to monitor

## 2020-04-26 NOTE — Evaluation (Addendum)
Occupational Therapy Evaluation Patient Details Name: [name] MRN: [id number] DOB: 13-Aug-1951 Today's Date: 04/26/2020    History of Present Illness 69 yo admitted due to PAD with resting RLE pain s/p Rt femoral endarterectomy. PMHx: PAD, left fempop BPG, thrombectomy of LLE, HTN, DM, arthritis   Clinical Impression   PTA pt living with fiance, at mod I level for BADL and mobility. Pt has been using RW since previous sx. At time of eval, pt completing bed mobility at mod I level and sit <> stands at min guard with RW. He was able to complete a household distance of functional mobility with RW without external assist from OT. He reports an increase in groin pain, but states it lessens with small amount of mobility. Pt also c/o penis and scrotum swelling. Educated pt on how to elevate as needed to manage this, will continue to assess for needs for scrotal sling. Pt able to complete figure 4 method for LB dressing at slow, steady pace to manage pain. No post acute OT needs identified. Will continue to follow pt acutely to progress to baseline and offer BADL education.     Follow Up Recommendations  No OT follow up;Supervision - Intermittent    Equipment Recommendations  None recommended by OT    Recommendations for Other Services       Precautions / Restrictions Precautions Precautions: Fall Restrictions Weight Bearing Restrictions: No      Mobility Bed Mobility Overal bed mobility: Modified Independent             General bed mobility comments: increased time to transition from supine to sit  Transfers Overall transfer level: Needs assistance   Transfers: Sit to/from Stand Sit to Stand: Min guard         General transfer comment: cues for hand placement and safety    Balance Overall balance assessment: Needs assistance   Sitting balance-Leahy Scale: Good     Standing balance support: Bilateral upper extremity supported;During functional activity Standing  balance-Leahy Scale: Poor Standing balance comment: heavy reliance on external support with functional mobility and tasks                           ADL either performed or assessed with clinical judgement   ADL Overall ADL's : Needs assistance/impaired Eating/Feeding: Independent   Grooming: Supervision/safety;Standing   Upper Body Bathing: Set up;Sitting   Lower Body Bathing: Sit to/from stand;Sitting/lateral leans;Min guard   Upper Body Dressing : Set up;Sitting   Lower Body Dressing: Min guard;Sitting/lateral leans;Sit to/from stand Lower Body Dressing Details (indicate cue type and reason): able to complete figure 4 with slow, steady movement to control pain Toilet Transfer: Min guard;Comfort height toilet;Grab bars;RW Toilet Transfer Details (indicate cue type and reason): needs handles to push up from, has BSC at home [place] and Hygiene: Set up;Sitting/lateral lean;Sit to/from stand   Tub/ Shower Transfer: Min guard;Ambulation;Shower seat;Rolling walker   Functional mobility during ADLs: Min guard;Rolling walker       Vision Baseline Vision/History: No visual deficits       Perception     Praxis      Pertinent Vitals/Pain Pain Assessment: 0-10 Pain Score: 8  Pain Location: right thigh Pain Descriptors / Indicators: Burning Pain Intervention(s): Limited activity within patient's tolerance;Monitored during session;Premedicated before session;Repositioned     Hand Dominance Right   Extremity/Trunk Assessment Upper Extremity Assessment Upper Extremity Assessment: Overall WFL for tasks assessed   Lower  Extremity Assessment Lower Extremity Assessment: Defer to PT evaluation       Communication Communication Communication: No difficulties   Cognition Arousal/Alertness: Awake/alert Behavior During Therapy: WFL for tasks assessed/performed Overall Cognitive Status: Within Functional Limits for tasks assessed                                      General Comments       Exercises     Shoulder Instructions      Home Living Family/patient expects to be discharged to:: Private residence Living Arrangements: Spouse/significant other Available Help at Discharge: Family;Available 24 hours/day Type of Home: House Home Access: Ramped entrance     Home Layout: One level     Bathroom Shower/Tub: Tub/shower unit;Curtain;Walk-in shower   Bathroom Toilet: Standard     Home Equipment: Cane - single point;Bedside commode;Hand held shower head;Walker - 2 wheels          Prior Functioning/Environment Level of Independence: Independent with assistive device(s)        Comments: has been using RW for gait since LLE thrombectomy        OT Problem List: Decreased knowledge of use of DME or AE;Decreased knowledge of precautions;Decreased activity tolerance;Pain;Increased edema      OT Treatment/Interventions: Self-care/ADL training;Therapeutic exercise;Patient/family education;Balance training;Energy conservation;Therapeutic activities;DME and/or AE instruction    OT Goals(Current goals can be found in the care plan section) Acute Rehab OT Goals Patient Stated Goal: return to walking without pain OT Goal Formulation: With patient Time For Goal Achievement: 05/10/20 Potential to Achieve Goals: Good  OT Frequency: Min 2X/week   Barriers to D/C:            Co-evaluation              AM-PAC OT "6 Clicks" Daily Activity     Outcome Measure Help from another person eating meals?: None Help from another person taking care of personal grooming?: None Help from another person toileting, which includes using toliet, bedpan, or urinal?: A Little Help from another person bathing (including washing, rinsing, drying)?: A Little Help from another person to put on and taking off regular upper body clothing?: None Help from another person to put on and taking off regular lower body  clothing?: A Little 6 Click Score: 21   End of Session Equipment Utilized During Treatment: Gait belt;Rolling walker Nurse Communication: Mobility status  Activity Tolerance: Patient tolerated treatment well Patient left: in chair;with call bell/phone within reach  OT Visit Diagnosis: Unsteadiness on feet (R26.81);Other abnormalities of gait and mobility (R26.89);Pain Pain - Right/Left: Right Pain - part of body: Hip;Leg                Time: 1550-1609 OT Time Calculation (min): 19 min Charges:  OT General Charges $OT Visit: 1 Visit OT Evaluation $OT Eval Moderate Complexity: [place], MSOT, OTR/L [facility] Office Number: [phone] Pager: [phone]  Zenovia Jarred 04/26/2020, 5:09 PM

## 2020-04-27 MED ORDER — OXYCODONE-ACETAMINOPHEN 5-325 MG PO TABS: 1.0000 | ORAL_TABLET | ORAL | 0 refills | Status: DC | PRN

## 2020-04-27 NOTE — Discharge Instructions (Signed)
Keep right groin incision clean and dry. Shower only

## 2020-04-27 NOTE — Progress Notes (Signed)
[facility] [place]  Subjective  -no complaints.  Rest pain is resolved.   Objective (!) 107/54 63 98.3 F (36.8 C) (Oral) 15 100%  Intake/Output Summary (Last 24 hours) at 04/27/2020 0753 Last data filed at 04/27/2020 0032 Gross per 24 hour  Intake 960 ml  Output 750 ml  Net 210 ml    Right groin is clean dry and intact Right dorsalis pedis posterior tibial and peroneal signals.  Peroneal is most brisk.  Laboratory Lab Results: Recent Labs    04/25/20 0504 04/25/20 0504 04/25/20 1923 04/26/20 0409  WBC 8.3  --   --  7.6  HGB 5.6*   < > 8.9* 8.1*  HCT 20.7*   < > 30.9* 27.7*  PLT 250  --   --  241   < > = values in this interval not displayed.   BMET Recent Labs    04/24/20 0950 04/25/20 0504  NA 136 138  K 3.9 3.7  CL 101 107  CO2  --  22  GLUCOSE 118* 176*  BUN 8 7*  CREATININE 0.60* 0.88  CALCIUM  --  7.7*    COAG Lab Results  Component Value Date   INR 1.1 04/24/2020   INR 1.1 12/19/2019   INR 1.13 04/01/2017   No results found for: PTT  Assessment/Planning:  Postop day 3 status post right femoral endarterectomy with profundoplasty for rest pain.  Rest pain is completely resolved.  He has brisk signals peroneal is the best signal.  Groin looks good.  Plan for discharge today.  Continue aspirin Plavix.  Follow-up in 3 weeks for wound check.  Marty Heck 04/27/2020 7:53 AM --

## 2020-04-27 NOTE — Discharge Summary (Signed)
Discharge Summary     [name] [birth year] 69 y.o. 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 male who presents with critical limb ischemia of the right lower extremity with rest pain.  Arteriography revealed a near occlusive lesion in the right common femoral artery with heavy calcified plaque greater than 95% stenosis.  Hospital Course:  The patient was admitted to the hospital and taken to the operating room on 04/24/2020 and underwent: Right common femoral artery endarterectomy with profundoplasty and bovine pericardial patch angioplasty.  He tolerated procedure well and was taken to the recovery room in satisfactory condition.  Findings: Near occlusive large calcified plaque in the distal right common femoral artery with heavy disease into the profunda.  Ultimately the common femoral was endarterectomized into the profunda and a large bovine pericardial patch was sewn.  There was excellent flow in the profunda upon completion with a palpable femoral pulse.   The pt tolerated the procedure well and was transported to the PACU in excellent condition.   By POD 1, he complained of significant right groin pain but his right lower extremity rest pain was improved.  His vital signs were stable and he was afebrile.  He had dopplerable signals in the dorsalis pedis, posterior tibial and peroneal arteries.  The patient's hemoglobin was noted to be low prior to surgery and this was monitored.  On postoperative day 2, his hemoglobin had drifted to 5.6.  He was asymptomatic.  He was transfused 2 units of packed red blood cells without complications.  His hemoglobin responded well.  His hemoglobin was 8.9 after transfusion and 8.1 the following day.  Anemia multifactorial.   On postoperative day 3, he was ambulating better with somewhat better pain control.  His groin was kneeling without drainage or hematoma.  Physical therapy evaluation recommended home PT.  The remainder of the hospital course consisted of increasing mobilization and increasing intake of solids without difficulty.  CBC    Component Value Date/Time   WBC 7.6 04/26/2020 0409   RBC 4.19 (L) 04/26/2020 0409   HGB 8.1 (L) 04/26/2020 0409   HGB 9.2 (L) 04/05/2018 1030   HCT 27.7 (L) 04/26/2020 0409   HCT 32.0 (L) 04/05/2018 1030   PLT 241 04/26/2020 0409   PLT 316 04/05/2018 1030   MCV 66.1 (L) 04/26/2020 0409   MCV 65 (L) 04/05/2018 1030   MCH 19.3 (L) 04/26/2020 0409   MCHC 29.2 (L) 04/26/2020 0409   RDW 29.3 (H) 04/26/2020 0409   RDW 30.3 (H) 04/05/2018 1030   LYMPHSABS 1.8 12/19/2019 0935   LYMPHSABS 2.4 03/26/2018 1548   MONOABS 0.7 12/19/2019 0935   EOSABS 0.1 12/19/2019 0935   EOSABS 0.2 03/26/2018 1548   BASOSABS 0.0 12/19/2019 0935   BASOSABS 0.0 03/26/2018 1548    BMET    Component Value Date/Time   NA 138 04/25/2020 0504   K 3.7 04/25/2020 0504   CL 107 04/25/2020 0504   CO2 22 04/25/2020 0504   GLUCOSE 176 (H) 04/25/2020 0504   BUN 7 (L) 04/25/2020 0504   CREATININE 0.88 04/25/2020 0504   CREATININE 0.94 03/31/2015 1454   CALCIUM 7.7 (L) 04/25/2020 0504   GFRNONAA >60 04/25/2020 0504   GFRAA >60 04/25/2020 0504     Discharge Instructions    Discharge patient  Complete by: As directed    Discharge disposition: 01-Home or Self Care   Discharge patient date: 04/27/2020   Face-to-face encounter (required for Medicare/Medicaid patients)   Complete by: As directed    I Barbie Banner certify that this patient is under my care and that I, or a nurse practitioner or physician's assistant working with me, had a face-to-face encounter that meets the physician face-to-face encounter requirements with this patient on 04/27/2020. The encounter with the patient was in  whole, or in part for the following medical condition(s) which is the primary reason for home health care (List medical condition): occlusive peripheral arterial disease   The encounter with the patient was in whole, or in part, for the following medical condition, which is the primary reason for home health care: PAD   I certify that, based on my findings, the following services are medically necessary home health services: Physical therapy   Reason for Medically Necessary Home Health Services: Therapy- Personnel officer, Public librarian   My clinical findings support the need for the above services: Pain interferes with ambulation/mobility   Further, I certify that my clinical findings support that this patient is homebound due to: Pain interferes with ambulation/mobility   Home Health   Complete by: As directed    To provide the following care/treatments: PT      Discharge Diagnosis:  PAD (peripheral artery disease) ([facility]) [I73.9] PAOD (peripheral arterial occlusive disease) ([place]) [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 PAOD (peripheral arterial occlusive disease) ([place]) 04/24/2020  . Adhesive capsulitis 03/10/2020  . Critical lower limb ischemia 12/19/2019  . Ischemia of foot 12/11/2019  . Acute pain of left shoulder 11/18/2019  . Lung nodule < 6cm on CT 01/28/2019  . Type 2 diabetes with complication ([place]) [id number]  . Radiculopathy, cervical region 02/11/2017  . Depression 10/03/2016  . Vision changes 02/02/2016  . Claudication of both lower extremities ([place]) 07/15/2015  . Proteinuria 08/27/2014  . Diabetic neuropathy ([place]) 02/27/2014  . Iron deficiency anemia 08/01/2012  . OBESITY, NOS 02/22/2007  . Tobacco abuse 02/22/2007  . HYPERTENSION, BENIGN SYSTEMIC 02/22/2007  . PAD (peripheral artery disease) ([place]) 02/22/2007  . Osteoarthritis 02/22/2007   Past Medical History:  Diagnosis Date  . Anemia   . Arthritis     OA  . Cervical radiculopathy    Dr. Vertell Limber neurosurgery  . Chronic lower back pain   . Diabetes mellitus    takes Metformin daily  . GERD (gastroesophageal reflux disease)    takes Protonix daily  . History of blood transfusion    "related to low HgB" ((09/10/2015  . Hyperlipidemia    takes Vytorin daily  . Hypertension    takes Benazepril and Bystolic daily  . PAD (peripheral artery disease) ([place])   . Pneumonia   . Shortness of breath dyspnea    with exertion  . Tobacco user   . Toe fracture, right 05/09/2011  . Wears glasses      Allergies as of 04/27/2020      Reactions   Glipizide Other (See Comments)   REACTION IS SIDE EFFECT Severe hypoglycemia to 40s.       Medication List    TAKE these medications   aspirin 81 MG EC tablet Take 1 tablet (81 mg total) by mouth daily.   atorvastatin 40 MG tablet Commonly known as: LIPITOR TAKE 1 TABLET BY MOUTH DAILY   benazepril 10 MG tablet Commonly  known as: LOTENSIN TAKE 1 TABLET BY MOUTH EVERY DAY   benazepril 5 MG tablet Commonly known as: LOTENSIN Take 1 tablet (5 mg total) by mouth daily.   Bystolic 20 MG Tabs Generic drug: Nebivolol HCl TAKE 1 TABLET BY MOUTH EVERY DAY What changed: how much to take   clopidogrel 75 MG tablet Commonly known as: PLAVIX TAKE 1 TABLET BY MOUTH EVERY DAY   [employer] Acetaminophen Ex St 500 MG tablet Generic drug: acetaminophen TAKE 2 TABLETS (1,000 MG TOTAL) BY MOUTH EVERY 8 (EIGHT) HOURS AS NEEDED FOR MODERATE PAIN. What changed: See the new instructions.   ezetimibe 10 MG tablet Commonly known as: ZETIA TAKE 1 TABLET BY MOUTH ONCE DAILY   FLUoxetine 10 MG capsule Commonly known as: PROZAC TAKE 1 CAPSULE BY MOUTH ONCE DAILY   gabapentin 100 MG capsule Commonly known as: NEURONTIN Take 1 capsule (100 mg total) by mouth at bedtime.   gabapentin 300 MG capsule Commonly known as: NEURONTIN Take 1 capsule (300 mg total) by mouth at bedtime.   metFORMIN 1000 MG tablet  Commonly known as: GLUCOPHAGE Take 1 tablet (1,000 mg total) by mouth 2 (two) times daily with a meal.   oxyCODONE-acetaminophen 5-325 MG tablet Commonly known as: PERCOCET/ROXICET Take 1 tablet by mouth every 4 (four) hours as needed for moderate pain.   pantoprazole 40 MG tablet Commonly known as: PROTONIX TAKE 1 TABLET BY MOUTH DAILY What changed: when to take this   ranitidine 75 MG tablet Commonly known as: Zantac 75 Take 1 tablet (75 mg total) by mouth 2 (two) times daily as needed for heartburn (heartburn, acid reflux).   traMADol 50 MG tablet Commonly known as: ULTRAM Take 1 tablet (50 mg total) by mouth every 6 (six) hours as needed.       Discharge Instructions: [facility] Discharge instructions Lower Extremity Bypass Surgery  Please refer to the following instruction for your post-procedure care. Your surgeon or physician assistant will discuss any changes with you.  Activity  You are encouraged to walk as much as you can. You can slowly return to normal activities during the month after your surgery. Avoid strenuous activity and heavy lifting until your doctor tells you it's OK. Avoid activities such as vacuuming or swinging a golf club. Do not drive until your doctor give the OK and you are no longer taking prescription pain medications. It is also normal to have difficulty with sleep habits, eating and bowel movement after surgery. These will go away with time.  Bathing/Showering  You may shower after you go home. Do not soak in a bathtub, hot tub, or swim until the incision heals completely.  Incision Care  Clean your incision with mild soap and water. Shower every day. Pat the area dry with a clean towel. You do not need a bandage unless otherwise instructed. Do not apply any ointments or creams to your incision. If you have open wounds you will be instructed how to care for them or a visiting nurse may be arranged for you. If  you have staples or sutures along your incision they will be removed at your post-op appointment. You may have skin glue on your incision. Do not peel it off. It will come off on its own in about one week.  Wash the groin wound with soap and water daily and pat dry. (No tub bath-only shower)  Then put a dry gauze or washcloth in the groin to keep this area dry  to help prevent wound infection.  Do this daily and as needed.  Do not use Vaseline or neosporin on your incisions.  Only use soap and water on your incisions and then protect and keep dry.  Diet  Resume your normal diet. There are no special food restrictions following this procedure. A low fat/ low cholesterol diet is recommended for all patients with vascular disease. In order to heal from your surgery, it is CRITICAL to get adequate nutrition. Your body requires vitamins, minerals, and protein. Vegetables are the best source of vitamins and minerals. Vegetables also provide the perfect balance of protein. Processed food has little nutritional value, so try to avoid this.  Medications  Resume taking all your medications unless your doctor or Physician Assistant tells you not to. If your incision is causing pain, you may take over-the-counter pain relievers such as acetaminophen (Tylenol). If you were prescribed a stronger pain medication, please aware these medication can cause nausea and constipation. Prevent nausea by taking the medication with a snack or meal. Avoid constipation by drinking plenty of fluids and eating foods with high amount of fiber, such as fruits, vegetables, and grains. Take Colace 100 mg (an over-the-counter stool softener) twice a day as needed for constipation.  Do not take Tylenol if you are taking prescription pain medications.  Follow Up  Our office will schedule a follow up appointment 2-3 weeks following discharge.  Please call us immediately for any of the following conditions  .Severe or worsening pain in  your legs or feet while at rest or while walking .Increase pain, redness, warmth, or drainage (pus) from your incision site(s) . Fever of 101 degree or higher . The swelling in your leg with the bypass suddenly worsens and becomes more painful than when you were in the hospital . If you have been instructed to feel your graft pulse then you should do so every day. If you can no longer feel this pulse, call the office immediately. Not all patients are given this instruction. .  Leg swelling is common after leg bypass surgery.  The swelling should improve over a few months following surgery. To improve the swelling, you may elevate your legs above the level of your heart while you are sitting or resting. Your surgeon or physician assistant may ask you to apply an ACE wrap or wear compression (TED) stockings to help to reduce swelling.  Reduce your risk of vascular disease  Stop smoking. If you would like help call [facility] at 1-800-QUIT-NOW 204-397-3130) or [facility] at 301-775-3774.  . Manage your cholesterol . Maintain a desired weight . Control your diabetes weight . Control your diabetes . Keep your blood pressure down .  If you have any questions, please call the office at [phone]   Prescriptions given: Roxicet #20 No Refill   Disposition: Home  Patient's condition: is Excellent  Follow up: 1. Dr. Dr. Carlis Abbott in 3 weeks   Risa Grill, PA-C [facility] [phone] 04/27/2020  8:09 AM  - For VQI Registry use ---   Post-op:  Wound infection: No  Graft infection: No  Transfusion: Yes    If yes, 2 units given New Arrhythmia: No Ipsilateral amputation: No, [ ]  Minor, [ ]  BKA, [ ]  AKA Discharge patency: [ ]  Primary, [ ]  Primary assisted, [ ]  Secondary, [ ]  Occluded Patency judged by: [x ] Dopper only, [ ]  Palpable graft pulse, []  Palpable distal pulse, [ ]  ABI inc. > 0.15, [ ]  Duplex  D/C Ambulatory Status: Ambulatory with Assistance   Complications: MI: No, [ ]  Troponin only, [ ]  EKG or Clinical CHF: No Resp failure:No, [ ]  Pneumonia, [ ]  Ventilator Chg in renal function: No, [ ]  Inc. Cr > 0.5, [ ]  Temp. Dialysis,  [ ]  Permanent dialysis Stroke: No, [ ]  Minor, [ ]  Major Return to OR: No  Reason for return to OR: [ ]  Bleeding, [ ]  Infection, [ ]  Thrombosis, [ ]  Revision  Discharge medications: Statin use:  no ASA use:  yes Plavix use:  yes Beta blocker use: yes CCB use:  No ACEI use:   yes ARB use:  no Coumadin use: no

## 2020-04-27 NOTE — Progress Notes (Signed)
  Progress Note    04/27/2020 7:05 AM 3 Days Post-Op  Subjective:  Pain about the same but able to ambulate with less discomfort   Vitals:   04/27/20 0031 04/27/20 0401  BP: 133/65 (!) 107/54  Pulse: 69 63  Resp: 17 15  Temp: 98 F (36.7 C) 98.3 F (36.8 C)  SpO2: 100% 100%    Physical Exam: Cardiac:  RRR Lungs:  nonlabored Incisions:  Right groin incision without signs of infection Extremities:  Right DP, PT and peroneal Doppler signals Abdomen:  benign  CBC    Component Value Date/Time   WBC 7.6 04/26/2020 0409   RBC 4.19 (L) 04/26/2020 0409   HGB 8.1 (L) 04/26/2020 0409   HGB 9.2 (L) 04/05/2018 1030   HCT 27.7 (L) 04/26/2020 0409   HCT 32.0 (L) 04/05/2018 1030   PLT 241 04/26/2020 0409   PLT 316 04/05/2018 1030   MCV 66.1 (L) 04/26/2020 0409   MCV 65 (L) 04/05/2018 1030   MCH 19.3 (L) 04/26/2020 0409   MCHC 29.2 (L) 04/26/2020 0409   RDW 29.3 (H) 04/26/2020 0409   RDW 30.3 (H) 04/05/2018 1030   LYMPHSABS 1.8 12/19/2019 0935   LYMPHSABS 2.4 03/26/2018 1548   MONOABS 0.7 12/19/2019 0935   EOSABS 0.1 12/19/2019 0935   EOSABS 0.2 03/26/2018 1548   BASOSABS 0.0 12/19/2019 0935   BASOSABS 0.0 03/26/2018 1548    BMET    Component Value Date/Time   NA 138 04/25/2020 0504   K 3.7 04/25/2020 0504   CL 107 04/25/2020 0504   CO2 22 04/25/2020 0504   GLUCOSE 176 (H) 04/25/2020 0504   BUN 7 (L) 04/25/2020 0504   CREATININE 0.88 04/25/2020 0504   CREATININE 0.94 03/31/2015 1454   CALCIUM 7.7 (L) 04/25/2020 0504   GFRNONAA >60 04/25/2020 0504   GFRAA >60 04/25/2020 0504     Intake/Output Summary (Last 24 hours) at 04/27/2020 0705 Last data filed at 04/27/2020 0032 Gross per 24 hour  Intake 960 ml  Output 750 ml  Net 210 ml    HOSPITAL MEDICATIONS Scheduled Meds: . sodium chloride   Intravenous Once  . aspirin EC  81 mg Oral Daily  . atorvastatin  40 mg Oral Daily  . benazepril  5 mg Oral Daily  . Chlorhexidine Gluconate Cloth  6 each Topical Daily    . clopidogrel  75 mg Oral Daily  . docusate sodium  100 mg Oral Daily  . ezetimibe  10 mg Oral Daily  . FLUoxetine  10 mg Oral Daily  . gabapentin  300 mg Oral QHS  . metFORMIN  1,000 mg Oral BID WC  . nebivolol  20 mg Oral Daily  . pantoprazole  40 mg Oral QAC breakfast   Continuous Infusions: . sodium chloride Stopped (04/25/20 0530)  . magnesium sulfate bolus IVPB     PRN Meds:.acetaminophen **OR** acetaminophen, alum & mag hydroxide-simeth, bisacodyl, guaiFENesin-dextromethorphan, hydrALAZINE, labetalol, magnesium sulfate bolus IVPB, metoprolol tartrate, morphine injection, ondansetron, oxyCODONE-acetaminophen, phenol, polyethylene glycol, potassium chloride  Assessment:  69 y.o. male is s/p:  Right FEA  3 Days Post-Op  Plan: -DC home    Risa Grill, [place] [facility] [phone] 04/27/2020  7:05 AM

## 2020-04-28 DIAGNOSIS — Z4801 Encounter for change or removal of surgical wound dressing: Secondary | ICD-10-CM | POA: Diagnosis not present

## 2020-04-28 LAB — BPAM RBC
Blood Product Expiration Date: 202105242359
Blood Product Expiration Date: 202105242359
Blood Product Expiration Date: 202105252359
Blood Product Expiration Date: 202105262359
ISSUE DATE / TIME: 202105010107
ISSUE DATE / TIME: 202105010107
ISSUE DATE / TIME: 202105011015
ISSUE DATE / TIME: 202105011348
Unit Type and Rh: 6200
Unit Type and Rh: 6200
Unit Type and Rh: 6200
Unit Type and Rh: 6200

## 2020-04-28 LAB — TYPE AND SCREEN
ABO/RH(D): A POS
Antibody Screen: NEGATIVE
Unit division: 0
Unit division: 0
Unit division: 0
Unit division: 0

## 2020-05-08 ENCOUNTER — Other Ambulatory Visit: Payer: Self-pay | Admitting: Physician Assistant

## 2020-05-08 ENCOUNTER — Telehealth: Payer: Self-pay

## 2020-05-08 MED ORDER — OXYCODONE-ACETAMINOPHEN 5-325 MG PO TABS: 1.0000 | ORAL_TABLET | Freq: Four times a day (QID) | ORAL | 0 refills | Status: DC | PRN

## 2020-05-08 NOTE — Telephone Encounter (Signed)
Telephone call received from pt requesting a refill in pain medication. Pt c/o constant pain in right groin extending down to thigh and knee and rates pain 7/10. Pt has tried OTC Tylenol ES but states has not helped. Pt leg elevation with decreased swelling noted.   Advised pt per Glynda Jaeger., PA that a Rx will be sent to his pharmacy today. Pt to follow up as scheduled on 05/19/20 with studies and Dr. Carlis Abbott. Minette Brine, RN

## 2020-05-08 NOTE — Progress Notes (Signed)
Pt called requesting pain medication for pain at groin into thigh.  No budging at groin per RN.  Will give Percocet 5/235mg  one q6h prn pain #10 No refill.   If pt needs pain medication after this refill, he will need to be seen in the office.   Leontine Locket, [facility] 05/08/2020 12:17 PM

## 2020-05-11 ENCOUNTER — Other Ambulatory Visit: Payer: Self-pay | Admitting: Student in an Organized Health Care Education/Training Program

## 2020-05-12 ENCOUNTER — Other Ambulatory Visit: Payer: Self-pay | Admitting: *Deleted

## 2020-05-12 ENCOUNTER — Other Ambulatory Visit: Payer: Self-pay | Admitting: Student in an Organized Health Care Education/Training Program

## 2020-05-12 DIAGNOSIS — I739 Peripheral vascular disease, unspecified: Secondary | ICD-10-CM

## 2020-05-18 ENCOUNTER — Telehealth (HOSPITAL_COMMUNITY): Payer: Self-pay

## 2020-05-18 NOTE — Telephone Encounter (Signed)

## 2020-05-19 ENCOUNTER — Ambulatory Visit (HOSPITAL_COMMUNITY)
Admission: RE | Admit: 2020-05-19 | Discharge: 2020-05-19 | Disposition: A | Discharge status: Home / Self Care | Payer: Medicare Other | Source: Ambulatory Visit | Attending: Vascular Surgery | Admitting: Vascular Surgery

## 2020-05-19 ENCOUNTER — Telehealth: Payer: Self-pay

## 2020-05-19 ENCOUNTER — Ambulatory Visit (INDEPENDENT_AMBULATORY_CARE_PROVIDER_SITE_OTHER): Payer: Self-pay | Admitting: Vascular Surgery

## 2020-05-19 ENCOUNTER — Encounter: Payer: Self-pay | Admitting: Vascular Surgery

## 2020-05-19 ENCOUNTER — Other Ambulatory Visit: Payer: Self-pay

## 2020-05-19 VITALS — BP 150/76 | HR 61 | Temp 97.3°F | Resp 18 | Ht 67.0 in | Wt 179.0 lb

## 2020-05-19 DIAGNOSIS — I739 Peripheral vascular disease, unspecified: Secondary | ICD-10-CM

## 2020-05-19 DIAGNOSIS — I998 Other disorder of circulatory system: Secondary | ICD-10-CM

## 2020-05-19 DIAGNOSIS — I70229 Atherosclerosis of native arteries of extremities with rest pain, unspecified extremity: Secondary | ICD-10-CM

## 2020-05-19 MED ORDER — TRAMADOL HCL 50 MG PO TABS: 50.0000 mg | ORAL_TABLET | Freq: Four times a day (QID) | ORAL | 0 refills | Status: DC | PRN

## 2020-05-19 NOTE — Progress Notes (Signed)
Patient name: [name] MRN: [id number] DOB: [birth year] Sex: male  REASON FOR VISIT: Postop check after right common femoral endarterectomy  HPI: [name] is a 69 y.o. male with multiple medical problems that presents for postop check after right common femoral endarterectomy.  His right femoral endarterectomy and profundoplasty were done on 04/24/2020 for critical limb ischemia with rest pain.  Bovine pericardial patch was used.  He states that his foot feels much better and his rest pain is resolved.  He has been keeping a dry dressing in his groin and has not noticed any drainage.  He is also well-known to me and I previously performed a thrombectomy of his left femoral to peroneal bypass on 12/19/19 that was placed by Dr. Scot Dock.  States no issues with his left leg at this time.  Past Medical History:  Diagnosis Date  . Anemia   . Arthritis    OA  . Cervical radiculopathy    Dr. Vertell Limber neurosurgery  . Chronic lower back pain   . Diabetes mellitus    takes Metformin daily  . GERD (gastroesophageal reflux disease)    takes Protonix daily  . History of blood transfusion    "related to low HgB" ((09/10/2015  . Hyperlipidemia    takes Vytorin daily  . Hypertension    takes Benazepril and Bystolic daily  . PAD (peripheral artery disease) ([place])   . Pneumonia   . Shortness of breath dyspnea    with exertion  . Tobacco user   . Toe fracture, right 05/09/2011  . Wears glasses     Past Surgical History:  Procedure Laterality Date  . ABDOMINAL AORTOGRAM W/LOWER EXTREMITY N/A 12/11/2017   Procedure: ABDOMINAL AORTOGRAM W/LOWER EXTREMITY;  Surgeon: Angelia Mould, MD;  Location: [place] [facility];  Service: Cardiovascular;  Laterality: N/A;  . ABDOMINAL AORTOGRAM W/LOWER EXTREMITY Bilateral 04/22/2020   Procedure: ABDOMINAL AORTOGRAM W/LOWER EXTREMITY;  Surgeon: Marty Heck, MD;  Location: [place] [facility];  Service: Cardiovascular;  Laterality: Bilateral;    . ANTERIOR CERVICAL DECOMP/DISCECTOMY FUSION  03/08/12   C6-7  . ANTERIOR CERVICAL DECOMP/DISCECTOMY FUSION  03/08/2012   Procedure: ANTERIOR CERVICAL DECOMPRESSION/DISCECTOMY FUSION 1 LEVEL/HARDWARE REMOVAL;  Surgeon: Erline Levine, MD;  Location: [facility];  Service: Neurosurgery;  Laterality: N/A;  revison of C5-7 anterior cervical decompression with fusion with Cervical Five-Thoracic One anterior cervical decompression with fusion with interbody prothesis plating and bonegraft  . BACK SURGERY  1996  . BYPASS GRAFT FEMORAL-PERONEAL Left 11/11/2016   Procedure: REDO LEFT FEMORAL-PERONEAL BYPASS WITH PROPATEN 6MM X 80CM GRAFT;  Surgeon: Angelia Mould, MD;  [facility];  Service: Vascular;  Laterality: Left;  . COLONOSCOPY W/ BIOPSIES AND POLYPECTOMY  08/17/2012   f/u 5 years, 4 polyps, no high grade dysplasia or malignancy, tubular adenoma, hyperplastic polyops  . EMBOLECTOMY Left 12/19/2019   Procedure: LEFT FEMERAL- PERONEAL THROMBECTOMY;  Surgeon: Marty Heck, MD;  Location: [place];  Service: Vascular;  Laterality: Left;  . ENDARTERECTOMY FEMORAL Right 04/24/2020   Procedure: RIGHT FEMORAL ENDARTERECTOMY;  Surgeon: Marty Heck, MD;  Location: [place];  Service: Vascular;  Laterality: Right;  . ENTEROSCOPY N/A 12/11/2015   Procedure: ENTEROSCOPY;  Surgeon: Carol Ada, MD;  Location: [facility];  Service: Endoscopy;  Laterality: N/A;  . ENTEROSCOPY N/A 03/29/2018   Procedure: ENTEROSCOPY;  Surgeon: Carol Ada, MD;  Location: [facility];  Service: Endoscopy;  Laterality: N/A;  . ESOPHAGOGASTRODUODENOSCOPY  08/17/2012   normal esophagus  and GEJ, diffuse gastritis with erythema- no malignancy, reactive gastropathy  with focal intestinal metaplasia  . FEMORAL-POPLITEAL BYPASS GRAFT Left 01/06/2016   Procedure: Left  COMMON FEMORAL-BELOW KNEE POPLITEAL ARTERY Bypass using non-reversed translocated saphenous vein graft from left leg;  Surgeon: Mal Misty, MD;   Location: [place];  Service: Vascular;  Laterality: Left;  . GIVENS CAPSULE STUDY N/A 11/24/2015   Procedure: GIVENS CAPSULE STUDY;  Surgeon: Juanita Craver, MD;  Location: [place];  Service: Endoscopy;  Laterality: N/A;  . HOT HEMOSTASIS N/A 03/29/2018   Procedure: HOT HEMOSTASIS (ARGON PLASMA COAGULATION/BICAP);  Surgeon: Carol Ada, MD;  Location: [place];  Service: Endoscopy;  Laterality: N/A;  . INGUINAL HERNIA REPAIR  1990's   right  . INTRAOPERATIVE ARTERIOGRAM Left 01/06/2016   Procedure: INTRA OPERATIVE ARTERIOGRAM LEFT LOWER LEG;  Surgeon: Mal Misty, MD;  Location: [place];  Service: Vascular;  Laterality: Left;  . INTRAOPERATIVE ARTERIOGRAM Left 11/11/2016   Procedure: INTRA OPERATIVE ARTERIOGRAM LEFT LOWER EXTRIMITY;  Surgeon: Angelia Mould, MD;  Location: [place];  Service: Vascular;  Laterality: Left;  . IR ANGIOGRAM FOLLOW UP STUDY  12/11/2017  . IR GENERIC HISTORICAL  10/24/2016   IR ANGIOGRAM FOLLOW UP STUDY  . LOWER EXTREMITY ANGIOGRAM Bilateral 07/30/2015   Procedure: Lower Extremity Angiogram;  Surgeon: Conrad Vinings, MD;  Location: [place] [facility];  Service: Cardiovascular;  Laterality: Bilateral;  . [place]   "lower"  . PATCH ANGIOPLASTY Left 12/19/2019   Procedure: LEFT FEMORAL -PERONEAL PATCH ANGIOPLASTY WITH XENOSURE BIOLOGIC PATCH  ;  Surgeon: Marty Heck, MD;  Location: [place];  Service: Vascular;  Laterality: Left;  . PATCH ANGIOPLASTY Right 04/24/2020   Procedure: Patch Angioplasty of Right Femoral Artery using Long Xenosure Biologic Patch 1cm x 14 cm;  Surgeon: Marty Heck, MD;  Location: [facility] OR;  Service: Vascular;  Laterality: Right;  . PERIPHERAL VASCULAR CATHETERIZATION N/A 07/30/2015   Procedure: Abdominal Aortogram;  Surgeon: Conrad Hermleigh, MD;  Location: [place] [facility];  Service: Cardiovascular;  Laterality: N/A;  . PERIPHERAL VASCULAR CATHETERIZATION N/A 10/24/2016   Procedure: Abdominal Aortogram;   Surgeon: Angelia Mould, MD;  Location: [place] [facility];  Service: Cardiovascular;  Laterality: N/A;  . PERIPHERAL VASCULAR CATHETERIZATION N/A 10/24/2016   Procedure: Lower Extremity Angiography;  Surgeon: Angelia Mould, MD;  Location: [place] [facility];  Service: Cardiovascular;  Laterality: N/A;  . PERIPHERAL VASCULAR INTERVENTION Right 12/11/2017   Procedure: PERIPHERAL VASCULAR INTERVENTION;  Surgeon: Angelia Mould, MD;  Location: [place] [facility];  Service: Cardiovascular;  Laterality: Right;  Marland Kitchen VASCULAR SURGERY  ~ 2007   Stent SFA   . VEIN HARVEST Left 01/06/2016   Procedure: LEFT GREATER [place];  Surgeon: Mal Misty, MD;  Location: [facility] OR;  Service: Vascular;  Laterality: Left;    Family History  Problem Relation Age of Onset  . Cancer Mother        colon Cancer  . Hyperlipidemia Mother   . Diabetes Mother   . Heart disease Father   . Hypertension Father   . Cancer Sister        Uterine  . Diabetes Sister   . Diabetes Brother     SOCIAL HISTORY: Social History   Tobacco Use  . Smoking status: Current Some Day Smoker    Years: 24.00    Types: Cigars  . Smokeless tobacco: Never Used  Substance Use Topics  .  Alcohol use: Yes    Alcohol/week: 2.0 standard drinks    Types: 2 Cans of beer per week    Allergies  Allergen Reactions  . Glipizide Other (See Comments)    REACTION IS SIDE EFFECT Severe hypoglycemia to 40s.     Current Outpatient Medications  Medication Sig Dispense Refill  . aspirin EC 81 MG EC tablet Take 1 tablet (81 mg total) by mouth daily.    Marland Kitchen atorvastatin (LIPITOR) 40 MG tablet TAKE 1 TABLET BY MOUTH DAILY 90 tablet 2  . benazepril (LOTENSIN) 5 MG tablet TAKE 1 TABLET BY MOUTH DAILY 90 tablet 1  . clopidogrel (PLAVIX) 75 MG tablet TAKE 1 TABLET BY MOUTH EVERY DAY 90 tablet 1  . [employer] ACETAMINOPHEN EX ST 500 MG tablet TAKE 2 TABLETS (1,000 MG TOTAL) BY MOUTH EVERY 8 (EIGHT) HOURS AS NEEDED FOR  MODERATE PAIN. (Patient taking differently: Take 1,000 mg by mouth every 8 (eight) hours as needed for moderate pain. ) 90 tablet 0  . ezetimibe (ZETIA) 10 MG tablet Take 1 tablet (10 mg total) by mouth daily. 90 tablet 1  . FLUoxetine (PROZAC) 10 MG capsule TAKE 1 CAPSULE BY MOUTH ONCE DAILY (Patient taking differently: Take 10 mg by mouth daily. ) 90 capsule 2  . gabapentin (NEURONTIN) 100 MG capsule Take 1 capsule (100 mg total) by mouth at bedtime. 90 capsule 1  . gabapentin (NEURONTIN) 300 MG capsule Take 1 capsule (300 mg total) by mouth at bedtime as needed. 90 capsule 1  . metFORMIN (GLUCOPHAGE) 1000 MG tablet Take 1 tablet (1,000 mg total) by mouth 2 (two) times daily with a meal. 180 tablet 1  . Nebivolol HCl (BYSTOLIC) 20 MG TABS Take 1 tablet (20 mg total) by mouth daily. 90 tablet 1  . pantoprazole (PROTONIX) 40 MG tablet TAKE 1 TABLET BY MOUTH DAILY (Patient taking differently: Take 40 mg by mouth daily before breakfast. ) 60 tablet 0  . ranitidine (ZANTAC 75) 75 MG tablet Take 1 tablet (75 mg total) by mouth 2 (two) times daily as needed for heartburn (heartburn, acid reflux). 30 tablet 0  . oxyCODONE-acetaminophen (PERCOCET/ROXICET) 5-325 MG tablet Take 1 tablet by mouth every 6 (six) hours as needed for moderate pain. (Patient not taking: Reported on 05/19/2020) 10 tablet 0  . traMADol (ULTRAM) 50 MG tablet Take 1 tablet (50 mg total) by mouth every 6 (six) hours as needed. (Patient not taking: Reported on 05/19/2020) 25 tablet 0   No current facility-administered medications for this visit.    REVIEW OF SYSTEMS:  [X]  denotes positive finding, [ ]  denotes negative finding Cardiac  Comments:  Chest pain or chest pressure:    Shortness of breath upon exertion:    Short of breath when lying flat:    Irregular heart rhythm:        Vascular    Pain in calf, thigh, or hip brought on by ambulation:    Pain in feet at night that wakes you up from your sleep:     Blood clot in your  veins:    Leg swelling:         Pulmonary    Oxygen at home:    Productive cough:     Wheezing:         Neurologic    Sudden weakness in arms or legs:     Sudden numbness in arms or legs:     Sudden onset of difficulty speaking or slurred speech:    Temporary  loss of vision in one eye:     Problems with dizziness:         Gastrointestinal    Blood in stool:     Vomited blood:         Genitourinary    Burning when urinating:     Blood in urine:        Psychiatric    Major depression:         Hematologic    Bleeding problems:    Problems with blood clotting too easily:        Skin    Rashes or ulcers:        Constitutional    Fever or chills:      PHYSICAL EXAM: Vitals:   05/19/20 0843  BP: (!) 150/76  Pulse: 61  Resp: 18  Temp: (!) 97.3 F (36.3 C)  TempSrc: Temporal  SpO2: 100%  Weight: 179 lb (81.2 kg)  Height: 5\' 7"  (1.702 m)    GENERAL: The patient is a well-nourished male, in no acute distress. The vital signs are documented above. CARDIAC: There is a regular rate and rhythm.  VASCULAR:  Right groin incision is nearly completely healed but he has a small sinus tract in the inguinal crease.  I did not express any drainage.  There is no cellulitis or other overt signs of infection. Right foot is warm.  No tissue loss in the right foot.  DATA:   ABI 0.45 on the right monophasic 0.86 on the left monophasic  Assessment/Plan:  69 year old male status post right femoral endarterectomy with profundoplasty on 04/24/2020 for critical limb ischemia with rest pain.  This is in the setting of a known right SFA flush occlusion and only single-vessel peroneal runoff distally.  Ultimately the rest pain in his right foot is completely resolved.  My only concern is he has a small sinus tract in the right inguinal crease at his previous incision for endarterectomy.  I probed this with a Q-tip and it appears very superficial.  I did not see any overt drainage.  I packed  this with 1/4 inch iodoform gauze and have ordered home health for dressing changes.  Also sent a prescription for tramadol to his pharmacy.  I will have him follow-up next week for wound check to keep a close eye on this.  Certainly discussed that if he has any signs of infection or this gets worse would require going to the OR for washout and possible VAC placement.   Marty Heck, MD [facility] Office: [phone]

## 2020-05-19 NOTE — Telephone Encounter (Signed)
Per Dr. Carlis Abbott, order was given to [facility] at [facility] for 1/4" Iodoform gauze packing to the right groin sinus daily.  [facility] says they can only provide a week of daily services but can provide patient teaching afterwards.  Patient has a follow up appt on June 1st.  Will continue daily dressings until follow up visit.  Tiffiny Worthy E., LPN.

## 2020-05-22 MED ORDER — OXYCODONE-ACETAMINOPHEN 5-325 MG PO TABS: 1.0000 | ORAL_TABLET | Freq: Every day | ORAL | 0 refills | Status: AC

## 2020-05-22 NOTE — Addendum Note (Signed)
Addended by: Ulyses Amor on: 05/22/2020 04:13 PM   Modules accepted: Orders

## 2020-05-26 ENCOUNTER — Ambulatory Visit (INDEPENDENT_AMBULATORY_CARE_PROVIDER_SITE_OTHER): Payer: Self-pay | Admitting: Vascular Surgery

## 2020-05-26 ENCOUNTER — Other Ambulatory Visit: Payer: Self-pay

## 2020-05-26 ENCOUNTER — Encounter: Payer: Self-pay | Admitting: Vascular Surgery

## 2020-05-26 VITALS — BP 100/57 | HR 76 | Temp 97.2°F | Resp 16 | Ht 67.0 in | Wt 178.0 lb

## 2020-05-26 DIAGNOSIS — I70229 Atherosclerosis of native arteries of extremities with rest pain, unspecified extremity: Secondary | ICD-10-CM

## 2020-05-26 DIAGNOSIS — I998 Other disorder of circulatory system: Secondary | ICD-10-CM

## 2020-05-26 NOTE — Progress Notes (Signed)
Patient name: [name] MRN: [id number] DOB: 05-27-1951 Sex: male  REASON FOR VISIT: Right groin check after right common femoral endarterectomy  HPI: [name] is a 69 y.o. male with multiple medical problems that presents for right groin check after right common femoral endarterectomy.  His right femoral endarterectomy and profundoplasty were done on 04/24/2020 for critical limb ischemia with rest pain.  Bovine pericardial patch was used.  Rest pain resolved.   On initial postop was noted to have a small open sinus in his right groin with no drainage.  We started packing this with iodoform gauze and ordered home health.  He still reports no drainage from the groin.  No fevers.  Groin feels good.  I also previously performed a thrombectomy of his left femoral to peroneal bypass on 12/19/19 that was placed by Dr. Scot Dock.    Past Medical History:  Diagnosis Date  . Anemia   . Arthritis    OA  . Cervical radiculopathy    Dr. Vertell Limber neurosurgery  . Chronic lower back pain   . Diabetes mellitus    takes Metformin daily  . GERD (gastroesophageal reflux disease)    takes Protonix daily  . History of blood transfusion    "related to low HgB" ((09/10/2015  . Hyperlipidemia    takes Vytorin daily  . Hypertension    takes Benazepril and Bystolic daily  . PAD (peripheral artery disease) ([place])   . Pneumonia   . Shortness of breath dyspnea    with exertion  . Tobacco user   . Toe fracture, right 05/09/2011  . Wears glasses     Past Surgical History:  Procedure Laterality Date  . ABDOMINAL AORTOGRAM W/LOWER EXTREMITY N/A 12/11/2017   Procedure: ABDOMINAL AORTOGRAM W/LOWER EXTREMITY;  Surgeon: Angelia Mould, MD;  Location: [place] [facility];  Service: Cardiovascular;  Laterality: N/A;  . ABDOMINAL AORTOGRAM W/LOWER EXTREMITY Bilateral 04/22/2020   Procedure: ABDOMINAL AORTOGRAM W/LOWER EXTREMITY;  Surgeon: Marty Heck, MD;  Location: [place] [facility];  Service:  Cardiovascular;  Laterality: Bilateral;  . ANTERIOR CERVICAL DECOMP/DISCECTOMY FUSION  03/08/12   C6-7  . ANTERIOR CERVICAL DECOMP/DISCECTOMY FUSION  03/08/2012   Procedure: ANTERIOR CERVICAL DECOMPRESSION/DISCECTOMY FUSION 1 LEVEL/HARDWARE REMOVAL;  Surgeon: Erline Levine, MD;  Location: [place] [facility];  Service: Neurosurgery;  Laterality: N/A;  revison of C5-7 anterior cervical decompression with fusion with Cervical Five-Thoracic One anterior cervical decompression with fusion with interbody prothesis plating and bonegraft  . BACK SURGERY  1996  . BYPASS GRAFT FEMORAL-PERONEAL Left 11/11/2016   Procedure: REDO LEFT FEMORAL-PERONEAL BYPASS WITH PROPATEN 6MM X 80CM GRAFT;  Surgeon: Angelia Mould, MD;  Location: [facility] OR;  Service: Vascular;  Laterality: Left;  . COLONOSCOPY W/ BIOPSIES AND POLYPECTOMY  08/17/2012   f/u 5 years, 4 polyps, no high grade dysplasia or malignancy, tubular adenoma, hyperplastic polyops  . EMBOLECTOMY Left 12/19/2019   Procedure: LEFT FEMERAL- PERONEAL THROMBECTOMY;  Surgeon: Marty Heck, MD;  Location: [place];  Service: Vascular;  Laterality: Left;  . ENDARTERECTOMY FEMORAL Right 04/24/2020   Procedure: RIGHT FEMORAL ENDARTERECTOMY;  Surgeon: Marty Heck, MD;  Location: [place];  Service: Vascular;  Laterality: Right;  . ENTEROSCOPY N/A 12/11/2015   Procedure: ENTEROSCOPY;  Surgeon: Carol Ada, MD;  Location: [facility] ENDOS[facility];  Service: Endoscopy;  Laterality: N/A;  . ENTEROSCOPY N/A 03/29/2018   Procedure: ENTEROSCOPY;  Surgeon: Carol Ada, MD;  Location: [facility];  Service: Endoscopy;  Laterality: N/A;  . ESOPHAGOGASTRODUODENOSCOPY  08/17/2012   normal esophagus and GEJ, diffuse gastritis with erythema- no malignancy, reactive gastropathy  with focal intestinal metaplasia  . FEMORAL-POPLITEAL BYPASS GRAFT Left 01/06/2016   Procedure: Left  COMMON FEMORAL-BELOW KNEE POPLITEAL ARTERY Bypass using non-reversed translocated saphenous vein graft from left  leg;  Surgeon: Mal Misty, MD;  Location: [place];  Service: Vascular;  Laterality: Left;  . GIVENS CAPSULE STUDY N/A 11/24/2015   Procedure: GIVENS CAPSULE STUDY;  Surgeon: Juanita Craver, MD;  Location: [place];  Service: Endoscopy;  Laterality: N/A;  . HOT HEMOSTASIS N/A 03/29/2018   Procedure: HOT HEMOSTASIS (ARGON PLASMA COAGULATION/BICAP);  Surgeon: Carol Ada, MD;  Location: [place];  Service: Endoscopy;  Laterality: N/A;  . INGUINAL HERNIA REPAIR  1990's   right  . INTRAOPERATIVE ARTERIOGRAM Left 01/06/2016   Procedure: INTRA OPERATIVE ARTERIOGRAM LEFT LOWER LEG;  Surgeon: Mal Misty, MD;  Location: [place];  Service: Vascular;  Laterality: Left;  . INTRAOPERATIVE ARTERIOGRAM Left 11/11/2016   Procedure: INTRA OPERATIVE ARTERIOGRAM LEFT LOWER EXTRIMITY;  Surgeon: Angelia Mould, MD;  Location: [place];  Service: Vascular;  Laterality: Left;  . IR ANGIOGRAM FOLLOW UP STUDY  12/11/2017  . IR GENERIC HISTORICAL  10/24/2016   IR ANGIOGRAM FOLLOW UP STUDY  . LOWER EXTREMITY ANGIOGRAM Bilateral 07/30/2015   Procedure: Lower Extremity Angiogram;  Surgeon: Conrad Bloomfield, MD;  Location: [place] [facility];  Service: Cardiovascular;  Laterality: Bilateral;  . [place]   "lower"  . PATCH ANGIOPLASTY Left 12/19/2019   Procedure: LEFT FEMORAL -PERONEAL PATCH ANGIOPLASTY WITH XENOSURE BIOLOGIC PATCH  ;  Surgeon: Marty Heck, MD;  Location: [place];  Service: Vascular;  Laterality: Left;  . PATCH ANGIOPLASTY Right 04/24/2020   Procedure: Patch Angioplasty of Right Femoral Artery using Long Xenosure Biologic Patch 1cm x 14 cm;  Surgeon: Marty Heck, MD;  Location: [facility] OR;  Service: Vascular;  Laterality: Right;  . PERIPHERAL VASCULAR CATHETERIZATION N/A 07/30/2015   Procedure: Abdominal Aortogram;  Surgeon: Conrad [place] Head, MD;  Location: [place] [facility];  Service: Cardiovascular;  Laterality: N/A;  . PERIPHERAL VASCULAR CATHETERIZATION N/A 10/24/2016    Procedure: Abdominal Aortogram;  Surgeon: Angelia Mould, MD;  Location: [place] [facility];  Service: Cardiovascular;  Laterality: N/A;  . PERIPHERAL VASCULAR CATHETERIZATION N/A 10/24/2016   Procedure: Lower Extremity Angiography;  Surgeon: Angelia Mould, MD;  Location: [place] [facility];  Service: Cardiovascular;  Laterality: N/A;  . PERIPHERAL VASCULAR INTERVENTION Right 12/11/2017   Procedure: PERIPHERAL VASCULAR INTERVENTION;  Surgeon: Angelia Mould, MD;  Location: [place] [facility];  Service: Cardiovascular;  Laterality: Right;  Marland Kitchen VASCULAR SURGERY  ~ 2007   Stent SFA   . VEIN HARVEST Left 01/06/2016   Procedure: LEFT GREATER Eugenio Saenz;  Surgeon: Mal Misty, MD;  Location: [facility] OR;  Service: Vascular;  Laterality: Left;    Family History  Problem Relation Age of Onset  . Cancer Mother        colon Cancer  . Hyperlipidemia Mother   . Diabetes Mother   . Heart disease Father   . Hypertension Father   . Cancer Sister        Uterine  . Diabetes Sister   . Diabetes Brother     SOCIAL HISTORY: Social History   Tobacco Use  . Smoking status: Current Some Day Smoker    Years: 24.00    Types: Cigars  . Smokeless tobacco: Never Used  Substance Use Topics  . Alcohol use: Yes    Alcohol/week: 2.0 standard drinks    Types: 2 Cans of beer per week    Allergies  Allergen Reactions  . Glipizide Other (See Comments)    REACTION IS SIDE EFFECT Severe hypoglycemia to 40s.     Current Outpatient Medications  Medication Sig Dispense Refill  . aspirin EC 81 MG EC tablet Take 1 tablet (81 mg total) by mouth daily.    Marland Kitchen atorvastatin (LIPITOR) 40 MG tablet TAKE 1 TABLET BY MOUTH DAILY 90 tablet 2  . benazepril (LOTENSIN) 5 MG tablet TAKE 1 TABLET BY MOUTH DAILY 90 tablet 1  . clopidogrel (PLAVIX) 75 MG tablet TAKE 1 TABLET BY MOUTH EVERY DAY 90 tablet 1  . [employer] ACETAMINOPHEN EX ST 500 MG tablet TAKE 2 TABLETS (1,000 MG TOTAL) BY MOUTH EVERY 8  (EIGHT) HOURS AS NEEDED FOR MODERATE PAIN. (Patient taking differently: Take 1,000 mg by mouth every 8 (eight) hours as needed for moderate pain. ) 90 tablet 0  . ezetimibe (ZETIA) 10 MG tablet Take 1 tablet (10 mg total) by mouth daily. 90 tablet 1  . FLUoxetine (PROZAC) 10 MG capsule TAKE 1 CAPSULE BY MOUTH ONCE DAILY (Patient taking differently: Take 10 mg by mouth daily. ) 90 capsule 2  . gabapentin (NEURONTIN) 100 MG capsule Take 1 capsule (100 mg total) by mouth at bedtime. 90 capsule 1  . gabapentin (NEURONTIN) 300 MG capsule Take 1 capsule (300 mg total) by mouth at bedtime as needed. 90 capsule 1  . metFORMIN (GLUCOPHAGE) 1000 MG tablet Take 1 tablet (1,000 mg total) by mouth 2 (two) times daily with a meal. 180 tablet 1  . Nebivolol HCl (BYSTOLIC) 20 MG TABS Take 1 tablet (20 mg total) by mouth daily. 90 tablet 1  . oxyCODONE-acetaminophen (PERCOCET/ROXICET) 5-325 MG tablet Take 1 tablet by mouth every 6 (six) hours as needed for moderate pain. 10 tablet 0  . oxyCODONE-acetaminophen (PERCOCET/ROXICET) 5-325 MG tablet Take 1 tablet by mouth daily for 6 doses. 6 tablet 0  . pantoprazole (PROTONIX) 40 MG tablet TAKE 1 TABLET BY MOUTH DAILY (Patient taking differently: Take 40 mg by mouth daily before breakfast. ) 60 tablet 0  . ranitidine (ZANTAC 75) 75 MG tablet Take 1 tablet (75 mg total) by mouth 2 (two) times daily as needed for heartburn (heartburn, acid reflux). 30 tablet 0  . traMADol (ULTRAM) 50 MG tablet Take 1 tablet (50 mg total) by mouth every 6 (six) hours as needed (dressing changes). 25 tablet 0   No current facility-administered medications for this visit.    REVIEW OF SYSTEMS:  [X]  denotes positive finding, [ ]  denotes negative finding Cardiac  Comments:  Chest pain or chest pressure:    Shortness of breath upon exertion:    Short of breath when lying flat:    Irregular heart rhythm:        Vascular    Pain in calf, thigh, or hip brought on by ambulation:    Pain in  feet at night that wakes you up from your sleep:     Blood clot in your veins:    Leg swelling:         Pulmonary    Oxygen at home:    Productive cough:     Wheezing:         Neurologic    Sudden weakness in arms or legs:     Sudden numbness in arms or legs:  Sudden onset of difficulty speaking or slurred speech:    Temporary loss of vision in one eye:     Problems with dizziness:         Gastrointestinal    Blood in stool:     Vomited blood:         Genitourinary    Burning when urinating:     Blood in urine:        Psychiatric    Major depression:         Hematologic    Bleeding problems:    Problems with blood clotting too easily:        Skin    Rashes or ulcers:        Constitutional    Fever or chills:      PHYSICAL EXAM: Vitals:   05/26/20 1403  BP: (!) 100/57  Pulse: 76  Resp: 16  Temp: (!) 97.2 F (36.2 C)  TempSrc: Temporal  SpO2: 100%  Weight: 178 lb (80.7 kg)  Height: 5\' 7"  (1.702 m)    GENERAL: The patient is a well-nourished male, in no acute distress. The vital signs are documented above. CARDIAC: There is a regular rate and rhythm.  VASCULAR:  Right groin nearly completely healed except for small sinus as noted below.  There is no overt drainage today.  I probed it and about a centimeter deep.  Repacked with iodoform gauze.  Palpable femoral pulse.      DATA:   ABI 0.45 on the right monophasic 0.86 on the left monophasic  Assessment/Plan:  69 year old male status post right femoral endarterectomy with profundoplasty on 04/24/2020 for critical limb ischemia with rest pain.  This is in the setting of a known right SFA flush occlusion and only single-vessel peroneal runoff distally.  Continue packing right groin wound with iodoform dressing daily.  No drainage at this time.  No overt signs of infection.  This appears fairly superficial but did discuss risk of getting a deeper infection requiring washout in the OR.  At this time I am  happy with how this is improving.  I will have him follow-up again in 2 weeks for wound check.  Call with questions or concerns sooner.    Marty Heck, MD [facility] Office: [phone]

## 2020-05-27 ENCOUNTER — Ambulatory Visit (INDEPENDENT_AMBULATORY_CARE_PROVIDER_SITE_OTHER): Payer: Medicare Other | Admitting: Family Medicine

## 2020-05-27 ENCOUNTER — Telehealth: Payer: Self-pay

## 2020-05-27 VITALS — BP 128/84 | HR 75 | Ht 67.0 in | Wt 180.0 lb

## 2020-05-27 DIAGNOSIS — I779 Disorder of arteries and arterioles, unspecified: Secondary | ICD-10-CM | POA: Diagnosis not present

## 2020-05-27 DIAGNOSIS — M7502 Adhesive capsulitis of left shoulder: Secondary | ICD-10-CM | POA: Diagnosis not present

## 2020-05-27 MED ORDER — DICLOFENAC SODIUM 1 % EX GEL: 2.0000 g | Freq: Four times a day (QID) | CUTANEOUS | 1 refills | Status: DC

## 2020-05-27 MED ORDER — NAPROXEN 500 MG PO TABS: 500.0000 mg | ORAL_TABLET | Freq: Two times a day (BID) | ORAL | 0 refills | Status: DC

## 2020-05-27 NOTE — Progress Notes (Signed)
    SUBJECTIVE:   CHIEF COMPLAINT / HPI:   Left adhesive capsulitis Patient presents for follow-up for left frozen shoulder.  Is been bothering for several months now.  He received a steroid injection in April.  This helped for a few weeks but then stopped.  Patient is having significant pain and limited mobility with his shoulder.  He is having difficulty doing his home exercises.  He is taking Tylenol only for pain medicine.  This does not help very much. We discussed options for him including physical therapy and NSAIDs.  Unfortunately it is too soon for him to get another trial of an injection.  Patient recently underwent femoral endarterectomy for PAD.  He says that he is from sleeping.  He feels that physical therapy would be too much while he is recovering.  He states there is limited range of motion significantly impacting ability to function.  PERTINENT  PMH / PSH:  PAD, diabetes mellitus, tobacco use  OBJECTIVE:   BP 128/84   Pulse 75   Ht 5\' 7"  (1.702 m)   Wt 180 lb (81.6 kg)   SpO2 99%   BMI 28.19 kg/m    Left shoulder No contusion Anterior tender to palpation Limited ROM including external rotation, flexion, extension   ASSESSMENT/PLAN:   Adhesive capsulitis Frozen shoulder.  With limited range of motion and severely affecting his daily living.  Strongly recommend formal physical therapy, however he feels that he is unable to complete this at this time.  Determined that short course of NSAIDs over the next 2 weeks while patient recovers and follow-up. -Naproxen scheduled for 5 days, then as needed -Topical Voltaren -Follow-up in 2 weeks     Bonnita Hollow, MD [place]

## 2020-05-27 NOTE — Patient Instructions (Addendum)
Try to naproxen and voltaren.  Follow-up in 2 weeks to reassess your shoulder.  We will likely need to start therapy then.

## 2020-05-27 NOTE — Telephone Encounter (Signed)
Meredith from [facility] is unable to continue daily dressing changes due to patient's current insurance requirements as well as low staffing.  Pt is aware and will attempt his own dressing changes as taught by [facility] nurse.    Thurston Hole., LPN

## 2020-05-27 NOTE — Assessment & Plan Note (Signed)
Frozen shoulder.  With limited range of motion and severely affecting his daily living.  Strongly recommend formal physical therapy, however he feels that he is unable to complete this at this time.  Determined that short course of NSAIDs over the next 2 weeks while patient recovers and follow-up. -Naproxen scheduled for 5 days, then as needed -Topical Voltaren -Follow-up in 2 weeks

## 2020-05-28 DIAGNOSIS — Z48812 Encounter for surgical aftercare following surgery on the circulatory system: Secondary | ICD-10-CM | POA: Diagnosis not present

## 2020-06-08 ENCOUNTER — Ambulatory Visit (INDEPENDENT_AMBULATORY_CARE_PROVIDER_SITE_OTHER): Payer: Self-pay | Admitting: Physician Assistant

## 2020-06-08 ENCOUNTER — Other Ambulatory Visit: Payer: Self-pay

## 2020-06-08 VITALS — BP 160/70 | HR 64 | Temp 98.1°F | Resp 20 | Ht 67.0 in | Wt 180.9 lb

## 2020-06-08 DIAGNOSIS — I998 Other disorder of circulatory system: Secondary | ICD-10-CM

## 2020-06-08 DIAGNOSIS — I70229 Atherosclerosis of native arteries of extremities with rest pain, unspecified extremity: Secondary | ICD-10-CM

## 2020-06-08 NOTE — Progress Notes (Signed)
POST OPERATIVE OFFICE NOTE    CC:  F/u for surgery  HPI:  This is a 69 y.o. male who is s/p Right common femoral artery endarterectomy with profundoplasty and bovine pericardial patch angioplasty for critical limb ischemia with rest pain.  This is in the setting of a known right SFA flush occlusion and only single-vessel peroneal runoff distally on 04/24/2020 by Dr. Carlis Abbott.  He had ABI on 5/25, which revealed right 0.45 and left 0.86.  He was last seen on 05/26/2020 by Dr. Carlis Abbott and at that time, his wound was healing.  His rest pain had resolved.  He was packing his wound.    Pt returns today for follow up.  He states he is doing well.  He states that his wound has almost healed.  There is no drainage from it.  He denies fevers but has had some chills but feels it is from the air conditioning.  He states he has a place at the base of his toes that stings to touch.    Allergies  Allergen Reactions  . Glipizide Other (See Comments)    REACTION IS SIDE EFFECT Severe hypoglycemia to 40s.     Current Outpatient Medications  Medication Sig Dispense Refill  . aspirin EC 81 MG EC tablet Take 1 tablet (81 mg total) by mouth daily.    Marland Kitchen atorvastatin (LIPITOR) 40 MG tablet TAKE 1 TABLET BY MOUTH DAILY 90 tablet 2  . benazepril (LOTENSIN) 5 MG tablet TAKE 1 TABLET BY MOUTH DAILY 90 tablet 1  . clopidogrel (PLAVIX) 75 MG tablet TAKE 1 TABLET BY MOUTH EVERY DAY 90 tablet 1  . [employer] ACETAMINOPHEN EX ST 500 MG tablet TAKE 2 TABLETS (1,000 MG TOTAL) BY MOUTH EVERY 8 (EIGHT) HOURS AS NEEDED FOR MODERATE PAIN. (Patient taking differently: Take 1,000 mg by mouth every 8 (eight) hours as needed for moderate pain. ) 90 tablet 0  . diclofenac Sodium (VOLTAREN) 1 % GEL Apply 2 g topically 4 (four) times daily. 50 g 1  . ezetimibe (ZETIA) 10 MG tablet Take 1 tablet (10 mg total) by mouth daily. 90 tablet 1  . FLUoxetine (PROZAC) 10 MG capsule TAKE 1 CAPSULE BY MOUTH ONCE DAILY (Patient taking differently: Take 10 mg  by mouth daily. ) 90 capsule 2  . gabapentin (NEURONTIN) 100 MG capsule Take 1 capsule (100 mg total) by mouth at bedtime. 90 capsule 1  . gabapentin (NEURONTIN) 300 MG capsule Take 1 capsule (300 mg total) by mouth at bedtime as needed. 90 capsule 1  . metFORMIN (GLUCOPHAGE) 1000 MG tablet Take 1 tablet (1,000 mg total) by mouth 2 (two) times daily with a meal. 180 tablet 1  . naproxen (NAPROSYN) 500 MG tablet Take 1 tablet (500 mg total) by mouth 2 (two) times daily with a meal. Take 1 pill twice a day for 5 day. Then take as needed. 30 tablet 0  . Nebivolol HCl (BYSTOLIC) 20 MG TABS Take 1 tablet (20 mg total) by mouth daily. 90 tablet 1  . oxyCODONE-acetaminophen (PERCOCET/ROXICET) 5-325 MG tablet Take 1 tablet by mouth every 6 (six) hours as needed for moderate pain. 10 tablet 0  . pantoprazole (PROTONIX) 40 MG tablet TAKE 1 TABLET BY MOUTH DAILY (Patient taking differently: Take 40 mg by mouth daily before breakfast. ) 60 tablet 0  . ranitidine (ZANTAC 75) 75 MG tablet Take 1 tablet (75 mg total) by mouth 2 (two) times daily as needed for heartburn (heartburn, acid reflux). 30 tablet 0  .  traMADol (ULTRAM) 50 MG tablet Take 1 tablet (50 mg total) by mouth every 6 (six) hours as needed (dressing changes). 25 tablet 0   No current facility-administered medications for this visit.     ROS:  See HPI  Physical Exam:  Today's Vitals   06/08/20 0946 06/08/20 0948  BP:  (!) 160/70  Pulse:  64  Resp:  20  Temp:  98.1 F (36.7 C)  TempSrc:  Temporal  SpO2:  100%  Weight:  180 lb 14.4 oz (82.1 kg)  Height:  5\' 7"  (1.702 m)  PainSc: 2     Body mass index is 28.33 kg/m.   Incision:      Extremities:  Monophasic PT doppler right; brisk left PT/peroneal  Area at base of 2nd toe discolored and stings with touch.   Assessment/Plan:  This is a 69 y.o. male who is s/p:  Right common femoral artery endarterectomy with profundoplasty and bovine pericardial patch angioplasty for critical  limb ischemia with rest pain.  This is in the setting of a known right SFA flush occlusion and only single-vessel peroneal runoff distally on 04/24/2020 by Dr. Carlis Abbott.    -pt's right groin improving nicely and is close to being healed.  I probed with cotton tip applicator and it does not track.  There is no drainage. Continue current care.   -there is an area of discoloration at the base of the right 2nd toe that stings to touch.  Discussed with pt to protect this area and if it worsens before he is seen in 2 weeks, to call us sooner.     Leontine Locket, [facility] [phone]  Clinic MD:  Trula Slade

## 2020-06-11 ENCOUNTER — Other Ambulatory Visit: Payer: Self-pay | Admitting: Student in an Organized Health Care Education/Training Program

## 2020-06-23 ENCOUNTER — Ambulatory Visit (INDEPENDENT_AMBULATORY_CARE_PROVIDER_SITE_OTHER): Payer: Self-pay | Admitting: Physician Assistant

## 2020-06-23 ENCOUNTER — Other Ambulatory Visit: Payer: Self-pay

## 2020-06-23 VITALS — BP 147/74 | HR 64 | Temp 97.7°F | Resp 20 | Ht 67.0 in | Wt 181.9 lb

## 2020-06-23 DIAGNOSIS — I739 Peripheral vascular disease, unspecified: Secondary | ICD-10-CM

## 2020-06-23 NOTE — Progress Notes (Signed)
POST OPERATIVE OFFICE NOTE    CC:  F/u for surgery  HPI:  This is a 70 y.o. male who is s/p Right common femoralarteryendarterectomy with profundoplasty and bovine pericardial patch angioplasty for critical limb ischemia with rest pain. This is in the setting of a known right SFA flush occlusion and only single-vessel peroneal runoff distally on 04/24/2020 by Dr. Carlis Abbott.  He had ABI on 5/25, which revealed right 0.45 and left 0.86.  He was seen on 05/26/2020 by Dr. Carlis Abbott and at that time, his wound was healing.  His rest pain had resolved.  He was packing his wound.  He was seen on 06/08/2020 and at that time, his right groin wound was almost healed.  There was no drainage and it did not track.  He had some discoloration at the base of his 2nd toe that stung to touch.  He was instructed to protect that area and if it worsened before being seen back, to call us sooner.    Pt returns today for follow up.  He states that his right groin incision is now completely healed.  He states that his right foot is about the same.  Still has complaints of burning in his right thigh.   Allergies  Allergen Reactions  . Glipizide Other (See Comments)    REACTION IS SIDE EFFECT Severe hypoglycemia to 40s.     Current Outpatient Medications  Medication Sig Dispense Refill  . aspirin EC 81 MG EC tablet Take 1 tablet (81 mg total) by mouth daily.    Marland Kitchen atorvastatin (LIPITOR) 40 MG tablet TAKE 1 TABLET BY MOUTH DAILY 90 tablet 2  . benazepril (LOTENSIN) 5 MG tablet TAKE 1 TABLET BY MOUTH DAILY 90 tablet 1  . clopidogrel (PLAVIX) 75 MG tablet TAKE 1 TABLET BY MOUTH EVERY DAY 90 tablet 1  . [facility] ACETAMINOPHEN EX ST 500 MG tablet TAKE 2 TABLETS (1,000 MG TOTAL) BY MOUTH EVERY 8 (EIGHT) HOURS AS NEEDED FOR MODERATE PAIN. (Patient taking differently: Take 1,000 mg by mouth every 8 (eight) hours as needed for moderate pain. ) 90 tablet 0  . diclofenac Sodium (VOLTAREN) 1 % GEL Apply 2 g topically 4 (four) times  daily. 50 g 1  . ezetimibe (ZETIA) 10 MG tablet Take 1 tablet (10 mg total) by mouth daily. 90 tablet 1  . FLUoxetine (PROZAC) 10 MG capsule TAKE 1 CAPSULE BY MOUTH ONCE DAILY (Patient taking differently: Take 10 mg by mouth daily. ) 90 capsule 2  . gabapentin (NEURONTIN) 100 MG capsule Take 1 capsule (100 mg total) by mouth at bedtime. 90 capsule 1  . gabapentin (NEURONTIN) 300 MG capsule Take 1 capsule (300 mg total) by mouth at bedtime as needed. 90 capsule 1  . metFORMIN (GLUCOPHAGE) 1000 MG tablet Take 1 tablet (1,000 mg total) by mouth 2 (two) times daily with a meal. 180 tablet 1  . naproxen (NAPROSYN) 500 MG tablet Take 1 tablet (500 mg total) by mouth 2 (two) times daily with a meal. Take 1 pill twice a day for 5 day. Then take as needed. 30 tablet 0  . Nebivolol HCl (BYSTOLIC) 20 MG TABS Take 1 tablet (20 mg total) by mouth daily. 90 tablet 1  . oxyCODONE-acetaminophen (PERCOCET/ROXICET) 5-325 MG tablet Take 1 tablet by mouth every 6 (six) hours as needed for moderate pain. (Patient not taking: Reported on 06/08/2020) 10 tablet 0  . pantoprazole (PROTONIX) 40 MG tablet Take 1 tablet (40 mg total) by mouth daily before breakfast.  90 tablet 0  . ranitidine (ZANTAC 75) 75 MG tablet Take 1 tablet (75 mg total) by mouth 2 (two) times daily as needed for heartburn (heartburn, acid reflux). 30 tablet 0  . traMADol (ULTRAM) 50 MG tablet Take 1 tablet (50 mg total) by mouth every 6 (six) hours as needed (dressing changes). (Patient not taking: Reported on 06/08/2020) 25 tablet 0   No current facility-administered medications for this visit.     ROS:  See HPI  Physical Exam:  Today's Vitals   06/23/20 0929  BP: (!) 147/74  Pulse: 64  Resp: 20  Temp: 97.7 F (36.5 C)  TempSrc: Temporal  SpO2: 100%  Weight: 181 lb 14.4 oz (82.5 kg)  Height: 5\' 7"  (1.702 m)  PainSc: 3    Body mass index is 28.49 kg/m.   Incision:  Right groin has completely healed.   Extremities:  + doppler signals  bilateral PT/peroneal; area at base of right 2nd toe and around the nail bed of the great toe are unchanged.     Assessment/Plan:  This is a 69 y.o. male who is s/p: Right common femoralarteryendarterectomy with profundoplasty and bovine pericardial patch angioplasty for critical limb ischemia with rest pain. This is in the setting of a known right SFA flush occlusion and only single-vessel peroneal runoff distally on 04/24/2020 by Dr. Carlis Abbott.  -pt's right groin incision has completely healed.   -he has + doppler signals bilateral PT/peroneal -the base of his right 2nd toe and great toe are unchanged.   -the stinging sensation in his right thigh is most likely from nerve irritation and hopeful this will improve over time -will have him return in 3 months with ABI, aortoiliac duplex and LLE duplex and see Dr. Carlis Abbott.  After that visit, most likely return to PA schedule for surveillance.   -he will continue to keep an eye on his right foot.  If he has any changes, he will call us and be seen sooner than 3 months.     Leontine Locket, [facility] [phone]  Clinic MD:  Carlis Abbott

## 2020-06-24 ENCOUNTER — Other Ambulatory Visit: Payer: Self-pay | Admitting: *Deleted

## 2020-06-24 DIAGNOSIS — I739 Peripheral vascular disease, unspecified: Secondary | ICD-10-CM

## 2020-07-17 ENCOUNTER — Other Ambulatory Visit: Payer: Self-pay | Admitting: Family Medicine

## 2020-07-17 DIAGNOSIS — M7502 Adhesive capsulitis of left shoulder: Secondary | ICD-10-CM

## 2020-08-20 ENCOUNTER — Other Ambulatory Visit: Payer: Self-pay | Admitting: Student in an Organized Health Care Education/Training Program

## 2020-09-17 ENCOUNTER — Other Ambulatory Visit: Payer: Self-pay | Admitting: Student in an Organized Health Care Education/Training Program

## 2020-09-17 DIAGNOSIS — E7800 Pure hypercholesterolemia, unspecified: Secondary | ICD-10-CM

## 2020-09-18 ENCOUNTER — Other Ambulatory Visit: Payer: Self-pay | Admitting: Student in an Organized Health Care Education/Training Program

## 2020-09-22 ENCOUNTER — Ambulatory Visit (INDEPENDENT_AMBULATORY_CARE_PROVIDER_SITE_OTHER)
Admission: RE | Admit: 2020-09-22 | Discharge: 2020-09-22 | Disposition: A | Discharge status: Home / Self Care | Payer: Medicare Other | Source: Ambulatory Visit | Attending: Vascular Surgery | Admitting: Vascular Surgery

## 2020-09-22 ENCOUNTER — Other Ambulatory Visit: Payer: Self-pay

## 2020-09-22 ENCOUNTER — Ambulatory Visit (INDEPENDENT_AMBULATORY_CARE_PROVIDER_SITE_OTHER): Payer: Medicare Other | Admitting: Vascular Surgery

## 2020-09-22 ENCOUNTER — Ambulatory Visit (HOSPITAL_COMMUNITY)
Admission: RE | Admit: 2020-09-22 | Discharge: 2020-09-22 | Disposition: A | Discharge status: Home / Self Care | Payer: Medicare Other | Source: Ambulatory Visit | Attending: Vascular Surgery | Admitting: Vascular Surgery

## 2020-09-22 ENCOUNTER — Encounter: Payer: Self-pay | Admitting: Vascular Surgery

## 2020-09-22 VITALS — BP 168/82 | HR 60 | Temp 97.2°F | Resp 18 | Ht 67.0 in | Wt 180.0 lb

## 2020-09-22 DIAGNOSIS — I739 Peripheral vascular disease, unspecified: Secondary | ICD-10-CM | POA: Diagnosis not present

## 2020-09-22 NOTE — Progress Notes (Signed)
Patient name: [name] MRN: [id number] DOB: 03/30/1951 Sex: male  REASON FOR VISIT: 80-month follow-up with vascular studies for surveillance of PVD  HPI: [name] is a 69 y.o. male with multiple medical problems including hypertension, hyperlipidemia, diabetes, PAD, tobacco abuse that presents for 15-month follow-up with vascular studies for ongoing surveillance of his PAD.  He describes having several weeks of burning in the left thigh when he walks and also having some burning pain in the right thigh that radiates down to the foot.  He feels this is different than the rest pain he had in the foot previously that prompted right femoral endarterectomy.  Feels the right leg is more bothersome than the left leg at this time. He had a right femoral endarterectomy and profundoplasty on 04/24/2020 for critical limb ischemia with rest pain.  Bovine pericardial patch was used.   On initial postop was noted to have a small open sinus in his right groin with no drainage and this ultimately healed.  I also previously performed a thrombectomy of his left femoral to peroneal bypass on 12/19/19 that was placed by Dr. Scot Dock.  He has also had a previous right EIA stent with 7 mm x 29 mm VBX in 2018 by Dr. Scot Dock.  Past Medical History:  Diagnosis Date  . Anemia   . Arthritis    OA  . Cervical radiculopathy    Dr. Vertell Limber neurosurgery  . Chronic lower back pain   . Diabetes mellitus    takes Metformin daily  . GERD (gastroesophageal reflux disease)    takes Protonix daily  . History of blood transfusion    "related to low HgB" ((09/10/2015  . Hyperlipidemia    takes Vytorin daily  . Hypertension    takes Benazepril and Bystolic daily  . PAD (peripheral artery disease) ([place])   . Pneumonia   . Shortness of breath dyspnea    with exertion  . Tobacco user   . Toe fracture, right 05/09/2011  . Wears glasses     Past Surgical History:  Procedure Laterality Date  . ABDOMINAL AORTOGRAM W/LOWER  EXTREMITY N/A 12/11/2017   Procedure: ABDOMINAL AORTOGRAM W/LOWER EXTREMITY;  Surgeon: Angelia Mould, MD;  Location: [place] CV LAB;  Service: Cardiovascular;  Laterality: N/A;  . ABDOMINAL AORTOGRAM W/LOWER EXTREMITY Bilateral 04/22/2020   Procedure: ABDOMINAL AORTOGRAM W/LOWER EXTREMITY;  Surgeon: Marty Heck, MD;  Location: [place] CV LAB;  Service: Cardiovascular;  Laterality: Bilateral;  . ANTERIOR CERVICAL DECOMP/DISCECTOMY FUSION  03/08/12   C6-7  . ANTERIOR CERVICAL DECOMP/DISCECTOMY FUSION  03/08/2012   Procedure: ANTERIOR CERVICAL DECOMPRESSION/DISCECTOMY FUSION 1 LEVEL/HARDWARE REMOVAL;  Surgeon: Erline Levine, MD;  Location: [place] NEURO ORS;  Service: Neurosurgery;  Laterality: N/A;  revison of C5-7 anterior cervical decompression with fusion with Cervical Five-Thoracic One anterior cervical decompression with fusion with interbody prothesis plating and bonegraft  . BACK SURGERY  1996  . BYPASS GRAFT FEMORAL-PERONEAL Left 11/11/2016   Procedure: REDO LEFT FEMORAL-PERONEAL BYPASS WITH PROPATEN 6MM X 80CM GRAFT;  Surgeon: Angelia Mould, MD;  Location[facility] OR;  Service: Vascular;  Laterality: Left;  . COLONOSCOPY W/ BIOPSIES AND POLYPECTOMY  08/17/2012   f/u 5 years, 4 polyps, no high grade dysplasia or malignancy, tubular adenoma, hyperplastic polyops  . EMBOLECTOMY Left 12/19/2019   Procedure: LEFT FEMERAL- PERONEAL THROMBECTOMY;  Surgeon: Marty Heck, MD;  Location: [place];  Service: Vascular;  Laterality: Left;  . ENDARTERECTOMY FEMORAL Right 04/24/2020   Procedure: RIGHT  FEMORAL ENDARTERECTOMY;  Surgeon: Marty Heck, MD;  Location: [place];  Service: Vascular;  Laterality: Right;  . ENTEROSCOPY N/A 12/11/2015   Procedure: ENTEROSCOPY;  Surgeon: Carol Ada, MD;  Location: [facility] ENDOSCOPY;  Service: Endoscopy;  Laterality: N/A;  . ENTEROSCOPY N/A 03/29/2018   Procedure: ENTEROSCOPY;  Surgeon: Carol Ada, MD;  Location: [facility] ENDOSCOPY;  Service:  Endoscopy;  Laterality: N/A;  . ESOPHAGOGASTRODUODENOSCOPY  08/17/2012   normal esophagus and GEJ, diffuse gastritis with erythema- no malignancy, reactive gastropathy  with focal intestinal metaplasia  . FEMORAL-POPLITEAL BYPASS GRAFT Left 01/06/2016   Procedure: Left  COMMON FEMORAL-BELOW KNEE POPLITEAL ARTERY Bypass using non-reversed translocated saphenous vein graft from left leg;  Surgeon: Mal Misty, MD;  Location: [place];  Service: Vascular;  Laterality: Left;  . GIVENS CAPSULE STUDY N/A 11/24/2015   Procedure: GIVENS CAPSULE STUDY;  Surgeon: Juanita Craver, MD;  Location: [place];  Service: Endoscopy;  Laterality: N/A;  . HOT HEMOSTASIS N/A 03/29/2018   Procedure: HOT HEMOSTASIS (ARGON PLASMA COAGULATION/BICAP);  Surgeon: Carol Ada, MD;  Location: [place];  Service: Endoscopy;  Laterality: N/A;  . INGUINAL HERNIA REPAIR  1990's   right  . INTRAOPERATIVE ARTERIOGRAM Left 01/06/2016   Procedure: INTRA OPERATIVE ARTERIOGRAM LEFT LOWER LEG;  Surgeon: Mal Misty, MD;  Location: [place];  Service: Vascular;  Laterality: Left;  . INTRAOPERATIVE ARTERIOGRAM Left 11/11/2016   Procedure: INTRA OPERATIVE ARTERIOGRAM LEFT LOWER EXTRIMITY;  Surgeon: Angelia Mould, MD;  Location: [place];  Service: Vascular;  Laterality: Left;  . IR ANGIOGRAM FOLLOW UP STUDY  12/11/2017  . IR GENERIC HISTORICAL  10/24/2016   IR ANGIOGRAM FOLLOW UP STUDY  . LOWER EXTREMITY ANGIOGRAM Bilateral 07/30/2015   Procedure: Lower Extremity Angiogram;  Surgeon: Conrad Bloomfield, MD;  Location: [place] CV LAB;  Service: Cardiovascular;  Laterality: Bilateral;  . [place]   "lower"  . PATCH ANGIOPLASTY Left 12/19/2019   Procedure: LEFT FEMORAL -PERONEAL PATCH ANGIOPLASTY WITH XENOSURE BIOLOGIC PATCH  ;  Surgeon: Marty Heck, MD;  Location: [place];  Service: Vascular;  Laterality: Left;  . PATCH ANGIOPLASTY Right 04/24/2020   Procedure: Patch Angioplasty of Right Femoral Artery using  Long Xenosure Biologic Patch 1cm x 14 cm;  Surgeon: Marty Heck, MD;  Location: [facility] OR;  Service: Vascular;  Laterality: Right;  . PERIPHERAL VASCULAR CATHETERIZATION N/A 07/30/2015   Procedure: Abdominal Aortogram;  Surgeon: Conrad [place], MD;  Location: [place] CV LAB;  Service: Cardiovascular;  Laterality: N/A;  . PERIPHERAL VASCULAR CATHETERIZATION N/A 10/24/2016   Procedure: Abdominal Aortogram;  Surgeon: Angelia Mould, MD;  Location: [place] CV LAB;  Service: Cardiovascular;  Laterality: N/A;  . PERIPHERAL VASCULAR CATHETERIZATION N/A 10/24/2016   Procedure: Lower Extremity Angiography;  Surgeon: Angelia Mould, MD;  Location: [place] CV LAB;  Service: Cardiovascular;  Laterality: N/A;  . PERIPHERAL VASCULAR INTERVENTION Right 12/11/2017   Procedure: PERIPHERAL VASCULAR INTERVENTION;  Surgeon: Angelia Mould, MD;  Location: [place] CV LAB;  Service: Cardiovascular;  Laterality: Right;  Marland Kitchen VASCULAR SURGERY  ~ 2007   Stent SFA   . VEIN HARVEST Left 01/06/2016   Procedure: LEFT GREATER [place];  Surgeon: Mal Misty, MD;  Location[facility] OR;  Service: Vascular;  Laterality: Left;    Family History  Problem Relation Age of Onset  . Cancer Mother        colon Cancer  . Hyperlipidemia Mother   . Diabetes  Mother   . Heart disease Father   . Hypertension Father   . Cancer Sister        Uterine  . Diabetes Sister   . Diabetes Brother     SOCIAL HISTORY: Social History   Tobacco Use  . Smoking status: Current Some Day Smoker    Years: 24.00    Types: Cigars  . Smokeless tobacco: Never Used  Substance Use Topics  . Alcohol use: Yes    Alcohol/week: 2.0 standard drinks    Types: 2 Cans of beer per week    Allergies  Allergen Reactions  . Glipizide Other (See Comments)    REACTION IS SIDE EFFECT Severe hypoglycemia to 40s.     Current Outpatient Medications  Medication Sig Dispense Refill  . aspirin EC 81 MG EC  tablet Take 1 tablet (81 mg total) by mouth daily.    Marland Kitchen atorvastatin (LIPITOR) 40 MG tablet TAKE 1 TABLET BY MOUTH ONCE DAILY 90 tablet 2  . benazepril (LOTENSIN) 5 MG tablet TAKE 1 TABLET BY MOUTH DAILY 90 tablet 1  . clopidogrel (PLAVIX) 75 MG tablet TAKE 1 TABLET BY MOUTH EVERY DAY 90 tablet 1  . [facility] ACETAMINOPHEN EX ST 500 MG tablet TAKE 2 TABLETS (1,000 MG TOTAL) BY MOUTH EVERY 8 (EIGHT) HOURS AS NEEDED FOR MODERATE PAIN. (Patient taking differently: Take 1,000 mg by mouth every 8 (eight) hours as needed for moderate pain. ) 90 tablet 0  . diclofenac Sodium (VOLTAREN) 1 % GEL Apply 2 g topically 4 (four) times daily. 50 g 1  . ezetimibe (ZETIA) 10 MG tablet Take 1 tablet (10 mg total) by mouth daily. 90 tablet 1  . FLUoxetine (PROZAC) 10 MG capsule Take 1 capsule (10 mg total) by mouth daily. 90 capsule 1  . gabapentin (NEURONTIN) 100 MG capsule Take 1 capsule (100 mg total) by mouth at bedtime. 90 capsule 1  . gabapentin (NEURONTIN) 300 MG capsule Take 1 capsule (300 mg total) by mouth at bedtime as needed. 90 capsule 1  . metFORMIN (GLUCOPHAGE) 1000 MG tablet TAKE ONE (1) TABLET BY MOUTH TWICE DAILY WITH MEALS 180 tablet 1  . naproxen (NAPROSYN) 500 MG tablet TAKE 1 PILL TWICE A DAY FOR 5 DAY. THEN TAKE AS NEEDED. 30 tablet 0  . Nebivolol HCl (BYSTOLIC) 20 MG TABS Take 1 tablet (20 mg total) by mouth daily. 90 tablet 1  . pantoprazole (PROTONIX) 40 MG tablet TAKE 1 TABLET BY MOUTH DAILY BEFORE BREAKFAST 30 tablet 0  . ranitidine (ZANTAC 75) 75 MG tablet Take 1 tablet (75 mg total) by mouth 2 (two) times daily as needed for heartburn (heartburn, acid reflux). 30 tablet 0  . oxyCODONE-acetaminophen (PERCOCET/ROXICET) 5-325 MG tablet Take 1 tablet by mouth every 6 (six) hours as needed for moderate pain. (Patient not taking: Reported on 09/22/2020) 10 tablet 0  . traMADol (ULTRAM) 50 MG tablet Take 1 tablet (50 mg total) by mouth every 6 (six) hours as needed (dressing changes). (Patient not  taking: Reported on 09/22/2020) 25 tablet 0   No current facility-administered medications for this visit.    REVIEW OF SYSTEMS:  [X]  denotes positive finding, [ ]  denotes negative finding Cardiac  Comments:  Chest pain or chest pressure:    Shortness of breath upon exertion:    Short of breath when lying flat:    Irregular heart rhythm:        Vascular    Pain in calf, thigh, or hip brought on by ambulation:  Pain in feet at night that wakes you up from your sleep:     Blood clot in your veins:    Leg swelling:         Pulmonary    Oxygen at home:    Productive cough:     Wheezing:         Neurologic    Sudden weakness in arms or legs:     Sudden numbness in arms or legs:     Sudden onset of difficulty speaking or slurred speech:    Temporary loss of vision in one eye:     Problems with dizziness:         Gastrointestinal    Blood in stool:     Vomited blood:         Genitourinary    Burning when urinating:     Blood in urine:        Psychiatric    Major depression:         Hematologic    Bleeding problems:    Problems with blood clotting too easily:        Skin    Rashes or ulcers:        Constitutional    Fever or chills:      PHYSICAL EXAM: Vitals:   09/22/20 1018  BP: (!) 168/82  Pulse: 60  Resp: 18  Temp: (!) 97.2 F (36.2 C)  TempSrc: Temporal  SpO2: 98%  Weight: 180 lb (81.6 kg)  Height: 5\' 7"  (1.702 m)    GENERAL: The patient is a well-nourished male, in no acute distress. The vital signs are documented above. CARDIAC: There is a regular rate and rhythm.  VASCULAR:  Right groin incision completely healed.  Right femoral pulse palpable. Left femoral pulse weaker but palpable No palpable pedal pulses No active tissue loss  DATA:   Duplex shows right CFA triphasic, left leg femoral peroneal bypass patent.  High grade stenosis in right EIA.    ABI 0.6 on the right monophasic (improved after endarterectomy) and 0.87 on the left  monophasic    Assessment/Plan:  69 year old male status post right femoral endarterectomy with profundoplasty on 04/24/2020 for critical limb ischemia with rest pain in the setting of a known right SFA flush occlusion and only single-vessel peroneal runoff distally.  I have also performed thrombectomy of his left femoral peroneal prosthetic bypass last year on Christmas Eve that was previously performed by Dr. Scot Dock.  In addition has had a right external iliac stent also previously placed by Dr. Scot Dock.  Discussed that given his recurrent symptoms in the right leg it would be reasonable to proceed with aortogram with lower extremity runoff to evaluate his right external iliac artery given the high velocity noted on duplex today >500.  Ultimately he is not interested in having any procedures right now given the Covid pandemic which is understandable.  He wants to get his booster shot first and re-evaluate in a few months.  Feels his symptoms are tolerable.  Discussed that we could reevaluate him in 3 months and I will repeat his aortoiliac duplex given difficult visualization today.  Marty Heck, MD [facility] [place] Office: [place]

## 2020-09-23 ENCOUNTER — Other Ambulatory Visit: Payer: Self-pay | Admitting: *Deleted

## 2020-09-23 DIAGNOSIS — I739 Peripheral vascular disease, unspecified: Secondary | ICD-10-CM

## 2020-10-05 ENCOUNTER — Encounter: Payer: Self-pay | Admitting: Student in an Organized Health Care Education/Training Program

## 2020-10-05 ENCOUNTER — Other Ambulatory Visit: Payer: Self-pay

## 2020-10-05 ENCOUNTER — Ambulatory Visit (INDEPENDENT_AMBULATORY_CARE_PROVIDER_SITE_OTHER): Payer: Medicare Other | Admitting: Student in an Organized Health Care Education/Training Program

## 2020-10-05 VITALS — BP 143/72 | HR 73 | Ht 67.0 in | Wt 180.0 lb

## 2020-10-05 DIAGNOSIS — I10 Essential (primary) hypertension: Secondary | ICD-10-CM | POA: Diagnosis not present

## 2020-10-05 DIAGNOSIS — M7502 Adhesive capsulitis of left shoulder: Secondary | ICD-10-CM | POA: Diagnosis not present

## 2020-10-05 DIAGNOSIS — Z23 Encounter for immunization: Secondary | ICD-10-CM

## 2020-10-05 DIAGNOSIS — I779 Disorder of arteries and arterioles, unspecified: Secondary | ICD-10-CM | POA: Diagnosis not present

## 2020-10-05 NOTE — Assessment & Plan Note (Signed)
Adhesive capsulitis without injury Continue home exercises and pain treatment PRN - shoulder xray was significant for osteoarthritis only. Consider MRI - refer to [facility] for evaluation for intraarticular dilation

## 2020-10-05 NOTE — Assessment & Plan Note (Signed)
Unable to assess BP accurately today as patient has not taken his medications yet this morning. He is asymptomatic and endorses good adherence. Repeat BP 150/90 - requested he take his medications prior to our next appointment

## 2020-10-05 NOTE — Patient Instructions (Signed)
It was a pleasure to see you today!  To summarize our discussion for this visit:  I have referred you to [facility] for evaluation of your adhesive capsulitis and to discuss the procedure of injection into the shoulder joint to break up the adhesions.  For our next visit, please take your medications before coming so that we can have an accurate blood pressure reading.  You are getting your covid booster and flu shot today. Let me know if you have any issues   Some additional health maintenance measures we should update are: Health Maintenance Due  Topic Date Due  . COVID-19 Vaccine (1) Never done  . COLONOSCOPY  12/27/2019  . FOOT EXAM  01/19/2020  . INFLUENZA VACCINE  07/26/2020  . HEMOGLOBIN A1C  09/09/2020  .    Call the clinic at [phone] if your symptoms worsen or you have any concerns.   Thank you for allowing me to take part in your care,  Dr. Doristine Mango

## 2020-10-05 NOTE — Assessment & Plan Note (Signed)
Patient received regular dose flu today since we are out of high-dose Received covid booster today. Patient observed >15 minutes without reaction.

## 2020-10-05 NOTE — Progress Notes (Signed)
    SUBJECTIVE:   CHIEF COMPLAINT / HPI: flu shot, shoulder pain  Adhesive capsulitis- pain/mobility have not improved with home exercises. Steroid injection helped for a few weeks. Returning today for other treatment options.   HTN- did not take BP medications yet this am  Vaccinations- due for flu and COVID booster. (completed pfizer series 01/2020)  OBJECTIVE:   BP (!) 143/72   Pulse 73   Ht 5\' 7"  (1.702 m)   Wt 180 lb (81.6 kg)   SpO2 96%   BMI 28.19 kg/m   Blood pressure rechecked by provider: 150/90  General: NAD, pleasant, able to participate in exam Shoulder: decreased ROM of left shoulder by about 50% compared to right in abduction (<90 degrees) and internal/external rotation with active and passive motions. Tenderness with palpation of joint. No erythema or edema. strength intact bilaterally Extremities: no edema or cyanosis. WWP. Skin: warm and dry, no rashes noted Neuro: alert and oriented, no focal deficits Psych: Normal affect and mood  ASSESSMENT/PLAN:   Adhesive capsulitis Adhesive capsulitis without injury Continue home exercises and pain treatment PRN - shoulder xray was significant for osteoarthritis only. Consider MRI - refer to [facility] for evaluation for intraarticular dilation   HYPERTENSION, BENIGN SYSTEMIC Unable to assess BP accurately today as patient has not taken his medications yet this morning. He is asymptomatic and endorses good adherence. Repeat BP 150/90 - requested he take his medications prior to our next appointment  Encounter to vaccinate patient Patient received regular dose flu today since we are out of high-dose Received covid booster today. Patient observed >15 minutes without reaction.      [place]

## 2020-10-13 ENCOUNTER — Ambulatory Visit: Payer: Self-pay

## 2020-10-13 ENCOUNTER — Other Ambulatory Visit: Payer: Self-pay

## 2020-10-13 ENCOUNTER — Ambulatory Visit (INDEPENDENT_AMBULATORY_CARE_PROVIDER_SITE_OTHER): Payer: Medicare Other | Admitting: Orthopaedic Surgery

## 2020-10-13 ENCOUNTER — Other Ambulatory Visit: Payer: Self-pay | Admitting: Physician Assistant

## 2020-10-13 ENCOUNTER — Encounter: Payer: Self-pay | Admitting: Orthopaedic Surgery

## 2020-10-13 DIAGNOSIS — M542 Cervicalgia: Secondary | ICD-10-CM

## 2020-10-13 DIAGNOSIS — G8929 Other chronic pain: Secondary | ICD-10-CM | POA: Diagnosis not present

## 2020-10-13 DIAGNOSIS — I779 Disorder of arteries and arterioles, unspecified: Secondary | ICD-10-CM | POA: Diagnosis not present

## 2020-10-13 DIAGNOSIS — M25512 Pain in left shoulder: Secondary | ICD-10-CM | POA: Diagnosis not present

## 2020-10-13 MED ORDER — METHYLPREDNISOLONE ACETATE 40 MG/ML IJ SUSP
40.0000 mg | INTRAMUSCULAR | Status: AC | PRN
  Administered 2020-10-13: 40 mg via INTRA_ARTICULAR

## 2020-10-13 MED ORDER — BUPIVACAINE HCL 0.5 % IJ SOLN
3.0000 mL | INTRAMUSCULAR | Status: AC | PRN
  Administered 2020-10-13: 3 mL via INTRA_ARTICULAR

## 2020-10-13 MED ORDER — MELOXICAM 7.5 MG PO TABS: 7.5000 mg | ORAL_TABLET | Freq: Every day | ORAL | 0 refills | Status: DC | PRN

## 2020-10-13 MED ORDER — LIDOCAINE HCL 1 % IJ SOLN
3.0000 mL | INTRAMUSCULAR | Status: AC | PRN
  Administered 2020-10-13: 3 mL

## 2020-10-13 NOTE — Progress Notes (Signed)
Office Visit Note   Patient: [name]           Date of Birth: 09-Jan-1951           MRN: [id number] Visit Date: 10/13/2020              Requested by: McDiarmid, Blane Ohara, MD [address] [place],  [place] [address] PCP: Richarda Osmond, DO   Assessment & Plan: Visit Diagnoses:  1. Chronic left shoulder pain   2. Neck pain     Plan: Impression is left shoulder rotator cuff tendinitis and glenohumeral osteoarthritis.  Due to the patient only having mild and temporary relief of symptoms following the intra-articular cortisone injection, he has elected to try a subacromial cortisone injection today which was performed.  He will call us in the next several weeks if his symptoms have not improved.  Follow-up with [place] as needed.  Follow-Up Instructions: Return if symptoms worsen or fail to improve.   Orders:  No orders of the defined types were placed in this encounter.  No orders of the defined types were placed in this encounter.     Procedures: Large Joint Inj: L subacromial bursa on 10/13/2020 9:39 PM Indications: pain Details: 22 G needle  Arthrogram: No  Medications: 3 mL lidocaine 1 %; 3 mL bupivacaine 0.5 %; 40 mg methylPREDNISolone acetate 40 MG/ML Outcome: tolerated well, no immediate complications Patient was prepped and draped in the usual sterile fashion.       Clinical Data: No additional findings.   Subjective: Chief Complaint  Patient presents with  . Left Shoulder - Pain    HPI patient is a pleasant 69 year old gentleman who comes in today with left shoulder pain.  This began approximately 4 months ago without any known injury or change in activity.  The pain he has starts on the top of the shoulder and radiates down his entire arm and into his hand.  He describes this as a constant ache with occasional burning.  Lying on the left side as well as any movement of the shoulder seem to aggravate his symptoms.  He has been taking Tylenol  without significant relief.  He was seen by a physiatrist about 3 months ago where x-rays of the left shoulder were obtained.  X-rays demonstrated glenohumeral degenerative changes and intra-articular cortisone injection was performed.  He had mild and temporary relief of symptoms from this.  He has not had a subacromial cortisone injection.  No previous cervical spine pathology.  Review of Systems as detailed in HPI.  All others reviewed and are negative.   Objective: Vital Signs: There were no vitals taken for this visit.  Physical Exam well-developed well-nourished gentleman in no acute distress.  Alert and oriented x3.  Ortho Exam examination of his left shoulder reveals approximately 50% range of motion all planes secondary to pain.  Markedly positive empty can with 3 out of 5 strength.  He has mild to moderate tenderness the AC joint.  He is neurovascular intact distally.  Specialty Comments:  No specialty comments available.  Imaging: No new imaging   PMFS History: Patient Active Problem List   Diagnosis Date Noted  . Encounter to vaccinate patient 10/05/2020  . PAOD (peripheral arterial occlusive disease) ([place]) 04/24/2020  . Adhesive capsulitis 03/10/2020  . Critical lower limb ischemia ([place]) 12/19/2019  . Ischemia of foot 12/11/2019  . Acute pain of left shoulder 11/18/2019  . Lung nodule < 6cm on CT 01/28/2019  .  Type 2 diabetes with complication ([place]) 09/98/3382  . Radiculopathy, cervical region 02/11/2017  . Depression 10/03/2016  . Vision changes 02/02/2016  . Claudication of both lower extremities ([place]) 07/15/2015  . Proteinuria 08/27/2014  . Diabetic neuropathy ([place]) 02/27/2014  . Iron deficiency anemia 08/01/2012  . OBESITY, NOS 02/22/2007  . Tobacco abuse 02/22/2007  . HYPERTENSION, BENIGN SYSTEMIC 02/22/2007  . PAD (peripheral artery disease) ([place]) 02/22/2007  . Osteoarthritis 02/22/2007   Past Medical History:  Diagnosis Date  . Anemia   . Arthritis     OA  . Cervical radiculopathy    Dr. Vertell Limber neurosurgery  . Chronic lower back pain   . Diabetes mellitus    takes Metformin daily  . GERD (gastroesophageal reflux disease)    takes Protonix daily  . History of blood transfusion    "related to low HgB" ((09/10/2015  . Hyperlipidemia    takes Vytorin daily  . Hypertension    takes Benazepril and Bystolic daily  . PAD (peripheral artery disease) ([place])   . Pneumonia   . Shortness of breath dyspnea    with exertion  . Tobacco user   . Toe fracture, right 05/09/2011  . Wears glasses     Family History  Problem Relation Age of Onset  . Cancer Mother        colon Cancer  . Hyperlipidemia Mother   . Diabetes Mother   . Heart disease Father   . Hypertension Father   . Cancer Sister        Uterine  . Diabetes Sister   . Diabetes Brother     Past Surgical History:  Procedure Laterality Date  . ABDOMINAL AORTOGRAM W/LOWER EXTREMITY N/A 12/11/2017   Procedure: ABDOMINAL AORTOGRAM W/LOWER EXTREMITY;  Surgeon: Angelia Mould, MD;  Location: [facility];  Service: Cardiovascular;  Laterality: N/A;  . ABDOMINAL AORTOGRAM W/LOWER EXTREMITY Bilateral 04/22/2020   Procedure: ABDOMINAL AORTOGRAM W/LOWER EXTREMITY;  Surgeon: Marty Heck, MD;  Location: [facility];  Service: Cardiovascular;  Laterality: Bilateral;  . ANTERIOR CERVICAL DECOMP/DISCECTOMY FUSION  03/08/12   C6-7  . ANTERIOR CERVICAL DECOMP/DISCECTOMY FUSION  03/08/2012   Procedure: ANTERIOR CERVICAL DECOMPRESSION/DISCECTOMY FUSION 1 LEVEL/HARDWARE REMOVAL;  Surgeon: Erline Levine, MD;  Location: [facility];  Service: Neurosurgery;  Laterality: N/A;  revison of C5-7 anterior cervical decompression with fusion with Cervical Five-Thoracic One anterior cervical decompression with fusion with interbody prothesis plating and bonegraft  . BACK SURGERY  1996  . BYPASS GRAFT FEMORAL-PERONEAL Left 11/11/2016   Procedure: REDO LEFT FEMORAL-PERONEAL BYPASS WITH  PROPATEN 6MM X 80CM GRAFT;  Surgeon: Angelia Mould, MD;  [facility];  Service: Vascular;  Laterality: Left;  . COLONOSCOPY W/ BIOPSIES AND POLYPECTOMY  08/17/2012   f/u 5 years, 4 polyps, no high grade dysplasia or malignancy, tubular adenoma, hyperplastic polyops  . EMBOLECTOMY Left 12/19/2019   Procedure: LEFT FEMERAL- PERONEAL THROMBECTOMY;  Surgeon: Marty Heck, MD;  Location: [facility];  Service: Vascular;  Laterality: Left;  . ENDARTERECTOMY FEMORAL Right 04/24/2020   Procedure: RIGHT FEMORAL ENDARTERECTOMY;  Surgeon: Marty Heck, MD;  Location: [facility];  Service: Vascular;  Laterality: Right;  . ENTEROSCOPY N/A 12/11/2015   Procedure: ENTEROSCOPY;  Surgeon: Carol Ada, MD;  Location: [facility] ENDOSCOPY;  Service: Endoscopy;  Laterality: N/A;  . ENTEROSCOPY N/A 03/29/2018   Procedure: ENTEROSCOPY;  Surgeon: Carol Ada, MD;  Location: [facility] ENDOSCOPY;  Service: Endoscopy;  Laterality: N/A;  . ESOPHAGOGASTRODUODENOSCOPY  08/17/2012   normal  esophagus and GEJ, diffuse gastritis with erythema- no malignancy, reactive gastropathy  with focal intestinal metaplasia  . FEMORAL-POPLITEAL BYPASS GRAFT Left 01/06/2016   Procedure: Left  COMMON FEMORAL-BELOW KNEE POPLITEAL ARTERY Bypass using non-reversed translocated saphenous vein graft from left leg;  Surgeon: Mal Misty, MD;  Location: [facility];  Service: Vascular;  Laterality: Left;  . GIVENS CAPSULE STUDY N/A 11/24/2015   Procedure: GIVENS CAPSULE STUDY;  Surgeon: Juanita Craver, MD;  Location: [facility];  Service: Endoscopy;  Laterality: N/A;  . HOT HEMOSTASIS N/A 03/29/2018   Procedure: HOT HEMOSTASIS (ARGON PLASMA COAGULATION/BICAP);  Surgeon: Carol Ada, MD;  Location: [facility];  Service: Endoscopy;  Laterality: N/A;  . INGUINAL HERNIA REPAIR  1990's   right  . INTRAOPERATIVE ARTERIOGRAM Left 01/06/2016   Procedure: INTRA OPERATIVE ARTERIOGRAM LEFT LOWER LEG;  Surgeon: Mal Misty, MD;  Location: [facility];  Service:  Vascular;  Laterality: Left;  . INTRAOPERATIVE ARTERIOGRAM Left 11/11/2016   Procedure: INTRA OPERATIVE ARTERIOGRAM LEFT LOWER EXTRIMITY;  Surgeon: Angelia Mould, MD;  Location: [facility];  Service: Vascular;  Laterality: Left;  . IR ANGIOGRAM FOLLOW UP STUDY  12/11/2017  . IR GENERIC HISTORICAL  10/24/2016   IR ANGIOGRAM FOLLOW UP STUDY  . LOWER EXTREMITY ANGIOGRAM Bilateral 07/30/2015   Procedure: Lower Extremity Angiogram;  Surgeon: Conrad [place], MD;  Location: [facility];  Service: Cardiovascular;  Laterality: Bilateral;  . [place]   "lower"  . PATCH ANGIOPLASTY Left 12/19/2019   Procedure: LEFT FEMORAL -PERONEAL PATCH ANGIOPLASTY WITH XENOSURE BIOLOGIC PATCH  ;  Surgeon: Marty Heck, MD;  Location: [facility];  Service: Vascular;  Laterality: Left;  . PATCH ANGIOPLASTY Right 04/24/2020   Procedure: Patch Angioplasty of Right Femoral Artery using Long Xenosure Biologic Patch 1cm x 14 cm;  Surgeon: Marty Heck, MD;  Location: [facility] OR;  Service: Vascular;  Laterality: Right;  . PERIPHERAL VASCULAR CATHETERIZATION N/A 07/30/2015   Procedure: Abdominal Aortogram;  Surgeon: Conrad [place], MD;  Location: [facility];  Service: Cardiovascular;  Laterality: N/A;  . PERIPHERAL VASCULAR CATHETERIZATION N/A 10/24/2016   Procedure: Abdominal Aortogram;  Surgeon: Angelia Mould, MD;  Location: [facility];  Service: Cardiovascular;  Laterality: N/A;  . PERIPHERAL VASCULAR CATHETERIZATION N/A 10/24/2016   Procedure: Lower Extremity Angiography;  Surgeon: Angelia Mould, MD;  Location: [facility];  Service: Cardiovascular;  Laterality: N/A;  . PERIPHERAL VASCULAR INTERVENTION Right 12/11/2017   Procedure: PERIPHERAL VASCULAR INTERVENTION;  Surgeon: Angelia Mould, MD;  Location: [facility];  Service: Cardiovascular;  Laterality: Right;  Marland Kitchen VASCULAR SURGERY  ~ 2007   Stent SFA   . VEIN HARVEST Left 01/06/2016    Procedure: LEFT GREATER SAPPHENOUS VEIN HARVEST;  Surgeon: Mal Misty, MD;  Location: [facility];  Service: Vascular;  Laterality: Left;   Social History   Occupational History  . Not on file  Tobacco Use  . Smoking status: Current Some Day Smoker    Years: 24.00    Types: Cigars  . Smokeless tobacco: Never Used  Vaping Use  . Vaping Use: Never used  Substance and Sexual Activity  . Alcohol use: Yes    Alcohol/week: 2.0 standard drinks    Types: 2 Cans of beer per week  . Drug use: No  . Sexual activity: Yes

## 2020-10-16 ENCOUNTER — Other Ambulatory Visit: Payer: Self-pay | Admitting: Student in an Organized Health Care Education/Training Program

## 2020-11-12 ENCOUNTER — Other Ambulatory Visit: Payer: Self-pay | Admitting: Student in an Organized Health Care Education/Training Program

## 2020-11-13 ENCOUNTER — Ambulatory Visit: Payer: Medicare Other | Admitting: Orthopaedic Surgery

## 2020-11-16 ENCOUNTER — Other Ambulatory Visit: Payer: Self-pay

## 2020-11-17 MED ORDER — NEBIVOLOL HCL 20 MG PO TABS
20.0000 mg | ORAL_TABLET | Freq: Every day | ORAL | 0 refills | Status: DC
Start: 2020-11-17 — End: 2021-02-11

## 2020-11-17 MED ORDER — BENAZEPRIL HCL 5 MG PO TABS
5.0000 mg | ORAL_TABLET | Freq: Every day | ORAL | 0 refills | Status: DC
Start: 2020-11-17 — End: 2021-02-11

## 2020-11-30 ENCOUNTER — Other Ambulatory Visit: Payer: Self-pay

## 2020-11-30 ENCOUNTER — Ambulatory Visit (INDEPENDENT_AMBULATORY_CARE_PROVIDER_SITE_OTHER): Payer: Medicare Other | Admitting: Student in an Organized Health Care Education/Training Program

## 2020-11-30 VITALS — BP 142/80 | HR 75 | Ht 67.0 in | Wt 174.0 lb

## 2020-11-30 DIAGNOSIS — Z13220 Encounter for screening for lipoid disorders: Secondary | ICD-10-CM | POA: Diagnosis not present

## 2020-11-30 DIAGNOSIS — E119 Type 2 diabetes mellitus without complications: Secondary | ICD-10-CM

## 2020-11-30 DIAGNOSIS — R252 Cramp and spasm: Secondary | ICD-10-CM | POA: Diagnosis not present

## 2020-11-30 DIAGNOSIS — I779 Disorder of arteries and arterioles, unspecified: Secondary | ICD-10-CM

## 2020-11-30 DIAGNOSIS — I739 Peripheral vascular disease, unspecified: Secondary | ICD-10-CM | POA: Diagnosis not present

## 2020-11-30 DIAGNOSIS — I70229 Atherosclerosis of native arteries of extremities with rest pain, unspecified extremity: Secondary | ICD-10-CM | POA: Diagnosis not present

## 2020-11-30 MED ORDER — CILOSTAZOL 50 MG PO TABS: 50.0000 mg | ORAL_TABLET | Freq: Two times a day (BID) | ORAL | 1 refills | Status: DC

## 2020-11-30 NOTE — Progress Notes (Signed)
   SUBJECTIVE:   CHIEF COMPLAINT / HPI: leg spasms  3-4 months of worsening night time leg cramps on both sides but worse on right. Has an extensive PAOD, followed by vascular. Last seen in September and told to follow up in 3 months for possible f/u procedure. Patient has already had multiple procedures, including emergently for limb ischemia with bypasses and stents. Right is worse than left. Today, as in the past, patient is stating that most of his symptoms are worse at rest and especially when sleeping at night. Sitting up at edge of bed will calm it down. Interfering with sleep significantly. Notices that his muscles are tense. From the calf all the way up to the hamstring.  Massaging makes it a little better. Has not tried any other medications.   not worsened by activity. Worsened when resting.  Not planning on going back to see Dr. Carlis Abbott. Does not want any more procedures.   H/o leg cramps from dehydration and also has LE claudication/PAD  Tobacco use: endorses occasional cigar use.  OBJECTIVE:   BP (!) 142/80   Pulse 75   Ht 5\' 7"  (1.702 m)   Wt 174 lb (78.9 kg)   SpO2 98%   BMI 27.25 kg/m   General: NAD, pleasant, able to participate in exam. Walking with a cane. Smells of tobacco Abdomen: soft, nontender, nondistended, no hepatic or splenomegaly, +BS Extremities: no edema. vericose veins diffusely bilaterally. No hair on lower legs, skin is shiny. Neg straight leg raise bilaterally Strength symmetrical, ~4/5 Walks with a limp Skin: warm and dry, no rashes noted Neuro: alert and oriented, no focal deficits Psych: Normal affect and mood  ASSESSMENT/PLAN:   PAD (peripheral artery disease) (HCC) Severe disease. Patient no longer wants to follow with vascular as he does not want any more procedures. Today, getting labs to look for alternate causes of leg pain including CBC, BMP, lipid panel, Hgb A1c Recommendations to patient: - exercise program >1hr walking per  day - compression stockings - warming blankets on LEs - resting at incline so legs below heart level - stay well hydrated - strongly urged patient to stop all smoking Prescribed- pletal BID.  Follow up after these interventions have been implemented to assess for effectiveness  Encouraged him to follow up with vascular still   Richarda Osmond, [place]

## 2020-11-30 NOTE — Assessment & Plan Note (Addendum)
Severe disease. Patient no longer wants to follow with vascular as he does not want any more procedures. Today, getting labs to look for alternate causes of leg pain including CBC, BMP, lipid panel, Hgb A1c Recommendations to patient: - exercise program >1hr walking per day - compression stockings - warming blankets on LEs - resting at incline so legs below heart level - stay well hydrated - strongly urged patient to stop all smoking Prescribed- pletal BID.  Follow up after these interventions have been implemented to assess for effectiveness  Encouraged him to follow up with vascular still

## 2020-11-30 NOTE — Patient Instructions (Signed)
It was a pleasure to see you today!  To summarize our discussion for this visit:  The things you can do for your leg cramping as it is likely due to your poor circulation  Use heating pads or blankets on your legs  Get compression stockings  Drink plenty of water  Try to have your legs at a lower elevation than your heart when resting so that your body is on an incline   Get more exercise during the day, walking at least an hour per day which can be broken up into smaller pieces  You need to [place]. I can't stress this enough as being a huge benefit for you and your symptoms  We are going to get some labs today to help determine and treat other contributing causes to your cramping  I will prescribe a medication that can improve the circulation called pletal.  I would recommend you continue to follow up with Dr. Carlis Abbott  Some additional health maintenance measures we should update are: Health Maintenance Due  Topic Date Due  . COLONOSCOPY  12/27/2019  . FOOT EXAM  01/19/2020  . HEMOGLOBIN A1C  09/09/2020  . OPHTHALMOLOGY EXAM  11/25/2020  .   Call the clinic at [phone] if your symptoms worsen or you have any concerns.   Thank you for allowing me to take part in your care,  Dr. Doristine Mango  Cilostazol tablets What is this medicine? CILOSTAZOL (sil [place] sta zol) is used to treat the symptoms of intermittent claudication. This condition causes pain in the legs during walking, and goes away with rest. By improving blood flow, this medicine helps people with this condition walk longer distances without pain. This medicine may be used for other purposes; ask your health care provider or pharmacist if you have questions. COMMON BRAND NAME(S): Pletal What should I tell my health care provider before I take this medicine? They need to know if you have any of the following conditions:  bleeding disorder or hemophilia  history of heart failure, heart  attack, or other heart disease  an unusual or allergic reaction to cilostazol, other medicines, foods, dyes, or preservatives  pregnant or trying to get pregnant  breast-feeding How should I use this medicine? Take this medicine by mouth with a full glass of water. Follow the directions on the prescription label. Take this medicine on an empty stomach, at least 30 minutes before or 2 hours after food. Do not take with food. Take your doses at regular intervals. Do not take your medicine more often than directed. Talk to your pediatrician regarding the use of this medicine in children. Special care may be needed. Overdosage: If you think you have taken too much of this medicine contact a poison control center or emergency room at once. NOTE: This medicine is only for you. Do not share this medicine with others. What if I miss a dose? If you miss a dose, take it as soon as you can. If it is almost time for your next dose, take only that dose. Do not take double or extra doses. What may interact with this medicine? Do not take this medicine with any of the following medications:  grapefruit juice This medicine may also interact with the following medications:  agents that prevent or treat blood clots like enoxaparin or warfarin  aspirin  diltiazem  erythromycin or clarithromycin  omeprazole  some medications for treating depression like fluoxetine, fluvoxamine, nefazodone  some medications for treating fungal infections  like ketoconazole, fluconazole, itraconazole This list may not describe all possible interactions. Give your health care provider a list of all the medicines, herbs, non-prescription drugs, or dietary supplements you use. Also tell them if you smoke, drink alcohol, or use illegal drugs. Some items may interact with your medicine. What should I watch for while using this medicine? Visit your doctor or health care professional for regular checks on your progress. It may  take 2 to 4 weeks for your condition to start to get better once you begin taking this medicine. In some people, it can take as long as 3 months for the condition to get better. You may get drowsy or dizzy. Do not drive, use machinery, or do anything that needs mental alertness until you know how this drug affects you. Do not stand or sit up quickly, especially if you are an older patient. This reduces the risk of dizzy or fainting spells. Alcohol can make you more drowsy and dizzy. Avoid alcoholic drinks. Smoking may have effects on the circulation that may limit the benefits you receive from this medicine. You may wish to discuss how to stop smoking with your doctor or health care professional. If you are going to have surgery, tell your doctor or health care professional that you are taking this medicine. What side effects may I notice from receiving this medicine? Side effects that you should report to your doctor or health care professional as soon as possible:  allergic reactions like skin rash, itching or hives, swelling of the face, lips, or tongue  chest pain  fast, slow, or irregular heartbeat  signs and symptoms of bleeding such as bloody or black, tarry stools; red or dark-brown urine; spitting up blood or brown material that looks like coffee grounds; red spots on the skin; unusual bruising or bleeding from the eye, gums, or nose  swelling in the legs or ankles Side effects that usually do not require medical attention (report to your doctor or health care professional if they continue or are bothersome):  diarrhea  headache  nausea, or upset stomach This list may not describe all possible side effects. Call your doctor for medical advice about side effects. You may report side effects to FDA at 1-800-FDA-1088. Where should I keep my medicine? Keep out of the reach of children. Store at room temperature between 15 and 30 degrees C (59 and 86 degrees F). Throw away any unused  medicine after the expiration date. NOTE: This sheet is a summary. It may not cover all possible information. If you have questions about this medicine, talk to your doctor, pharmacist, or health care provider.  2020 Elsevier/Gold Standard (2013-04-04 15:00:52)

## 2020-12-01 LAB — LIPID PANEL
Chol/HDL Ratio: 2.3 ratio (ref 0.0–5.0)
Cholesterol, Total: 98 mg/dL — ABNORMAL LOW (ref 100–199)
HDL: 43 mg/dL (ref >39)
LDL Chol Calc (NIH): 37 mg/dL (ref 0–99)
Triglycerides: 90 mg/dL (ref 0–149)
VLDL Cholesterol Cal: 18 mg/dL (ref 5–40)

## 2020-12-01 LAB — BASIC METABOLIC PANEL
BUN/Creatinine Ratio: 13 (ref 10–24)
BUN: 10 mg/dL (ref 8–27)
CO2: 21 mmol/L (ref 20–29)
Calcium: 8.9 mg/dL (ref 8.6–10.2)
Chloride: 102 mmol/L (ref 96–106)
Creatinine, Ser: 0.78 mg/dL (ref 0.76–1.27)
GFR calc Af Amer: 106 mL/min/{1.73_m2} (ref >59)
GFR calc non Af Amer: 91 mL/min/{1.73_m2} (ref >59)
Glucose: 91 mg/dL (ref 65–99)
Potassium: 4.6 mmol/L (ref 3.5–5.2)
Sodium: 139 mmol/L (ref 134–144)

## 2020-12-01 LAB — CBC
Hematocrit: 26.4 % — ABNORMAL LOW (ref 37.5–51.0)
Hemoglobin: 6.1 g/dL — CL (ref 13.0–17.7)
MCH: 13.2 pg — ABNORMAL LOW (ref 26.6–33.0)
MCHC: 23.1 g/dL — CL (ref 31.5–35.7)
MCV: 57 fL — ABNORMAL LOW (ref 79–97)
Platelets: 288 10*3/uL (ref 150–450)
RBC: 4.63 x10E6/uL (ref 4.14–5.80)
RDW: 22.9 % — ABNORMAL HIGH (ref 11.6–15.4)
WBC: 4.9 10*3/uL (ref 3.4–10.8)

## 2020-12-01 LAB — HEMOGLOBIN A1C
Est. average glucose Bld gHb Est-mCnc: 114 mg/dL
Hgb A1c MFr Bld: 5.6 % (ref 4.8–5.6)

## 2020-12-04 ENCOUNTER — Encounter: Payer: Self-pay | Admitting: Family Medicine

## 2020-12-04 ENCOUNTER — Encounter: Payer: Self-pay | Admitting: Student in an Organized Health Care Education/Training Program

## 2020-12-04 ENCOUNTER — Inpatient Hospital Stay (HOSPITAL_COMMUNITY)
Admission: EM | Admit: 2020-12-04 | Discharge: 2020-12-05 | DRG: 812 | Disposition: A | Discharge status: Home / Self Care | Payer: Medicare Other | Attending: Family Medicine | Admitting: Family Medicine

## 2020-12-04 ENCOUNTER — Encounter (HOSPITAL_COMMUNITY): Payer: Self-pay | Admitting: Anesthesiology

## 2020-12-04 ENCOUNTER — Encounter (HOSPITAL_COMMUNITY): Payer: Self-pay | Admitting: Emergency Medicine

## 2020-12-04 ENCOUNTER — Other Ambulatory Visit: Payer: Self-pay

## 2020-12-04 DIAGNOSIS — I739 Peripheral vascular disease, unspecified: Secondary | ICD-10-CM | POA: Diagnosis not present

## 2020-12-04 DIAGNOSIS — D509 Iron deficiency anemia, unspecified: Principal | ICD-10-CM | POA: Diagnosis present

## 2020-12-04 DIAGNOSIS — M545 Low back pain, unspecified: Secondary | ICD-10-CM | POA: Diagnosis present

## 2020-12-04 DIAGNOSIS — Z83438 Family history of other disorder of lipoprotein metabolism and other lipidemia: Secondary | ICD-10-CM

## 2020-12-04 DIAGNOSIS — Z8701 Personal history of pneumonia (recurrent): Secondary | ICD-10-CM | POA: Diagnosis not present

## 2020-12-04 DIAGNOSIS — K5520 Angiodysplasia of colon without hemorrhage: Secondary | ICD-10-CM | POA: Diagnosis present

## 2020-12-04 DIAGNOSIS — M79651 Pain in right thigh: Secondary | ICD-10-CM | POA: Diagnosis present

## 2020-12-04 DIAGNOSIS — K219 Gastro-esophageal reflux disease without esophagitis: Secondary | ICD-10-CM | POA: Diagnosis present

## 2020-12-04 DIAGNOSIS — E65 Localized adiposity: Secondary | ICD-10-CM

## 2020-12-04 DIAGNOSIS — L987 Excessive and redundant skin and subcutaneous tissue: Secondary | ICD-10-CM | POA: Diagnosis not present

## 2020-12-04 DIAGNOSIS — Z7982 Long term (current) use of aspirin: Secondary | ICD-10-CM

## 2020-12-04 DIAGNOSIS — G8929 Other chronic pain: Secondary | ICD-10-CM | POA: Diagnosis present

## 2020-12-04 DIAGNOSIS — D649 Anemia, unspecified: Secondary | ICD-10-CM | POA: Diagnosis not present

## 2020-12-04 DIAGNOSIS — K921 Melena: Secondary | ICD-10-CM | POA: Diagnosis not present

## 2020-12-04 DIAGNOSIS — F32A Depression, unspecified: Secondary | ICD-10-CM | POA: Diagnosis present

## 2020-12-04 DIAGNOSIS — E785 Hyperlipidemia, unspecified: Secondary | ICD-10-CM | POA: Diagnosis present

## 2020-12-04 DIAGNOSIS — Z888 Allergy status to other drugs, medicaments and biological substances status: Secondary | ICD-10-CM

## 2020-12-04 DIAGNOSIS — K640 First degree hemorrhoids: Secondary | ICD-10-CM | POA: Diagnosis present

## 2020-12-04 DIAGNOSIS — F17290 Nicotine dependence, other tobacco product, uncomplicated: Secondary | ICD-10-CM | POA: Diagnosis present

## 2020-12-04 DIAGNOSIS — E118 Type 2 diabetes mellitus with unspecified complications: Secondary | ICD-10-CM | POA: Diagnosis not present

## 2020-12-04 DIAGNOSIS — Z20822 Contact with and (suspected) exposure to covid-19: Secondary | ICD-10-CM | POA: Diagnosis present

## 2020-12-04 DIAGNOSIS — M1990 Unspecified osteoarthritis, unspecified site: Secondary | ICD-10-CM | POA: Diagnosis present

## 2020-12-04 DIAGNOSIS — Z8719 Personal history of other diseases of the digestive system: Secondary | ICD-10-CM | POA: Diagnosis not present

## 2020-12-04 DIAGNOSIS — M79604 Pain in right leg: Secondary | ICD-10-CM | POA: Diagnosis not present

## 2020-12-04 DIAGNOSIS — Z8249 Family history of ischemic heart disease and other diseases of the circulatory system: Secondary | ICD-10-CM

## 2020-12-04 DIAGNOSIS — Z86010 Personal history of colonic polyps: Secondary | ICD-10-CM | POA: Diagnosis not present

## 2020-12-04 DIAGNOSIS — K3189 Other diseases of stomach and duodenum: Secondary | ICD-10-CM | POA: Diagnosis not present

## 2020-12-04 DIAGNOSIS — Z79899 Other long term (current) drug therapy: Secondary | ICD-10-CM

## 2020-12-04 DIAGNOSIS — M79605 Pain in left leg: Secondary | ICD-10-CM | POA: Diagnosis present

## 2020-12-04 DIAGNOSIS — D500 Iron deficiency anemia secondary to blood loss (chronic): Secondary | ICD-10-CM | POA: Diagnosis not present

## 2020-12-04 DIAGNOSIS — Z7984 Long term (current) use of oral hypoglycemic drugs: Secondary | ICD-10-CM

## 2020-12-04 DIAGNOSIS — M7500 Adhesive capsulitis of unspecified shoulder: Secondary | ICD-10-CM | POA: Diagnosis present

## 2020-12-04 DIAGNOSIS — E1140 Type 2 diabetes mellitus with diabetic neuropathy, unspecified: Secondary | ICD-10-CM | POA: Diagnosis present

## 2020-12-04 DIAGNOSIS — Z7902 Long term (current) use of antithrombotics/antiplatelets: Secondary | ICD-10-CM

## 2020-12-04 DIAGNOSIS — I10 Essential (primary) hypertension: Secondary | ICD-10-CM | POA: Diagnosis present

## 2020-12-04 DIAGNOSIS — Z833 Family history of diabetes mellitus: Secondary | ICD-10-CM

## 2020-12-04 DIAGNOSIS — K558 Other vascular disorders of intestine: Secondary | ICD-10-CM | POA: Diagnosis not present

## 2020-12-04 DIAGNOSIS — E1151 Type 2 diabetes mellitus with diabetic peripheral angiopathy without gangrene: Secondary | ICD-10-CM | POA: Diagnosis present

## 2020-12-04 DIAGNOSIS — K2950 Unspecified chronic gastritis without bleeding: Secondary | ICD-10-CM | POA: Diagnosis not present

## 2020-12-04 DIAGNOSIS — I70209 Unspecified atherosclerosis of native arteries of extremities, unspecified extremity: Secondary | ICD-10-CM | POA: Diagnosis present

## 2020-12-04 LAB — COMPREHENSIVE METABOLIC PANEL
ALT: 10 U/L (ref 0–44)
AST: 19 U/L (ref 15–41)
Albumin: 4 g/dL (ref 3.5–5.0)
Alkaline Phosphatase: 50 U/L (ref 38–126)
Anion gap: 13 (ref 5–15)
BUN: 8 mg/dL (ref 8–23)
CO2: 21 mmol/L — ABNORMAL LOW (ref 22–32)
Calcium: 9.2 mg/dL (ref 8.9–10.3)
Chloride: 102 mmol/L (ref 98–111)
Creatinine, Ser: 0.85 mg/dL (ref 0.61–1.24)
GFR, Estimated: >60 mL/min (ref >60)
Glucose, Bld: 104 mg/dL — ABNORMAL HIGH (ref 70–99)
Potassium: 3.7 mmol/L (ref 3.5–5.1)
Sodium: 136 mmol/L (ref 135–145)
Total Bilirubin: 0.3 mg/dL (ref 0.3–1.2)
Total Protein: 7.4 g/dL (ref 6.5–8.1)

## 2020-12-04 LAB — CBC
HCT: 24 % — ABNORMAL LOW (ref 39.0–52.0)
Hemoglobin: 5.6 g/dL — CL (ref 13.0–17.0)
MCH: 13.3 pg — ABNORMAL LOW (ref 26.0–34.0)
MCHC: 23.3 g/dL — ABNORMAL LOW (ref 30.0–36.0)
MCV: 57.1 fL — ABNORMAL LOW (ref 80.0–100.0)
Platelets: 286 10*3/uL (ref 150–400)
RBC: 4.2 MIL/uL — ABNORMAL LOW (ref 4.22–5.81)
RDW: 25.3 % — ABNORMAL HIGH (ref 11.5–15.5)
WBC: 4.8 10*3/uL (ref 4.0–10.5)
nRBC: 0 % (ref 0.0–0.2)

## 2020-12-04 LAB — CBG MONITORING, ED
Glucose-Capillary: 96 mg/dL (ref 70–99)
Glucose-Capillary: 88 mg/dL (ref 70–99)

## 2020-12-04 LAB — HEMOGLOBIN AND HEMATOCRIT, BLOOD
HCT: 27 % — ABNORMAL LOW (ref 39.0–52.0)
Hemoglobin: 7.5 g/dL — ABNORMAL LOW (ref 13.0–17.0)

## 2020-12-04 LAB — RESP PANEL BY RT-PCR (FLU A&B, COVID) ARPGX2
Influenza A by PCR: NEGATIVE
Influenza B by PCR: NEGATIVE
SARS Coronavirus 2 by RT PCR: NEGATIVE

## 2020-12-04 LAB — PREPARE RBC (CROSSMATCH)

## 2020-12-04 LAB — POC OCCULT BLOOD, ED: Fecal Occult Bld: NEGATIVE

## 2020-12-04 MED ORDER — NICOTINE 14 MG/24HR TD PT24
14.0000 mg | MEDICATED_PATCH | Freq: Every day | TRANSDERMAL | Status: DC
  Administered 2020-12-04 – 2020-12-05 (×2): 14 mg via TRANSDERMAL
  Filled 2020-12-04 (×2): qty 1

## 2020-12-04 MED ORDER — GABAPENTIN 300 MG PO CAPS
300.0000 mg | ORAL_CAPSULE | Freq: Every day | ORAL | Status: DC
  Administered 2020-12-04: 300 mg via ORAL
  Filled 2020-12-04: qty 1

## 2020-12-04 MED ORDER — NEBIVOLOL HCL 10 MG PO TABS
20.0000 mg | ORAL_TABLET | Freq: Every day | ORAL | Status: DC
  Filled 2020-12-04: qty 2

## 2020-12-04 MED ORDER — SODIUM CHLORIDE 0.9% IV SOLUTION: Freq: Once | INTRAVENOUS | Status: DC

## 2020-12-04 MED ORDER — PANTOPRAZOLE SODIUM 40 MG IV SOLR
40.0000 mg | Freq: Every day | INTRAVENOUS | Status: DC
  Administered 2020-12-04: 40 mg via INTRAVENOUS
  Filled 2020-12-04: qty 40

## 2020-12-04 MED ORDER — ACETAMINOPHEN 325 MG PO TABS
650.0000 mg | ORAL_TABLET | Freq: Four times a day (QID) | ORAL | Status: DC | PRN
  Administered 2020-12-04 – 2020-12-05 (×2): 650 mg via ORAL
  Filled 2020-12-04 (×2): qty 2

## 2020-12-04 MED ORDER — FLUOXETINE HCL 10 MG PO CAPS
10.0000 mg | ORAL_CAPSULE | Freq: Every day | ORAL | Status: DC
  Administered 2020-12-04 – 2020-12-05 (×2): 10 mg via ORAL
  Filled 2020-12-04 (×3): qty 1

## 2020-12-04 MED ORDER — ATORVASTATIN CALCIUM 40 MG PO TABS
40.0000 mg | ORAL_TABLET | Freq: Every day | ORAL | Status: DC
  Administered 2020-12-04: 40 mg via ORAL
  Filled 2020-12-04: qty 1

## 2020-12-04 MED ORDER — LACTATED RINGERS IV SOLN: INTRAVENOUS | Status: DC

## 2020-12-04 NOTE — H&P (View-Only) (Signed)
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  He was evaluated by his PCP earlier this week for complaints of leg pain.  Basic blood work showed that he was severely anemic and he was asked to present to the ER.  In the ER his HGB was noted to be at 5.6 g/dL with an MCV of 57.  He denied any chest pain or SOB.  He does not take any NSAIDs and he does not complain of any recent GERD, hematochezia, or melena.  In the past he was evaluated for IDA.  The most recent SBE was on 03/29/2018 and it was negative for any overt bleeding.  Nonbleeding AVMs were found and ablated.  The VCE on 01/07/2016 was negative for any bleeding source.  The only time that active bleeding was found was with the SBE on 12/11/2015.  At that time in the second portion of the duodenum fresh blood was found.  Careful examination of the area was not able to isolate the exact source.  A nearby AVM was ablated, but it was not definitive that this was the source of bleeding.  The area was tattooed with SPOT.  His last colonoscopy was with Dr. Collene Mares on 08/17/2012 with findings of some small adenomas and he was advised to have a 5 year colonoscopy at that time.  Past Medical History:  Diagnosis Date  . Anemia   . Arthritis    OA  . Cervical radiculopathy    Dr. Vertell Limber neurosurgery  . Chronic lower back pain   . Claudication of both lower extremities ([place]) 07/15/2015  . Critical lower limb ischemia ([place]) 12/19/2019  . Diabetes mellitus    takes Metformin daily  . GERD (gastroesophageal reflux disease)    takes Protonix daily  . History of blood transfusion    "related to low HgB" ((09/10/2015  . Hyperlipidemia    takes Vytorin daily  . Hypertension    takes Benazepril and Bystolic daily  . PAD (peripheral artery disease)  ([place])   . Pneumonia   . Radiculopathy, cervical region 02/11/2017  . Shortness of breath dyspnea    with exertion  . Tobacco user   . Toe fracture, right 05/09/2011  . Wears glasses     Past Surgical History:  Procedure Laterality Date  . ABDOMINAL AORTOGRAM W/LOWER EXTREMITY N/A 12/11/2017   Procedure: ABDOMINAL AORTOGRAM W/LOWER EXTREMITY;  Surgeon: Angelia Mould, MD;  Location: [place] [facility];  Service: Cardiovascular;  Laterality: N/A;  . ABDOMINAL AORTOGRAM W/LOWER EXTREMITY Bilateral 04/22/2020   Procedure: ABDOMINAL AORTOGRAM W/LOWER EXTREMITY;  Surgeon: Marty Heck, MD;  Location: [place] [facility];  Service: Cardiovascular;  Laterality: Bilateral;  . ANTERIOR CERVICAL DECOMP/DISCECTOMY FUSION  03/08/12   C6-7  . ANTERIOR CERVICAL DECOMP/DISCECTOMY FUSION  03/08/2012   Procedure: ANTERIOR CERVICAL DECOMPRESSION/DISCECTOMY FUSION 1 LEVEL/HARDWARE REMOVAL;  Surgeon: Erline Levine, MD;  Location: [place] [facility];  Service: Neurosurgery;  Laterality: N/A;  revison of C5-7 anterior cervical decompression with fusion with Cervical Five-Thoracic One anterior cervical decompression with fusion with interbody prothesis plating and bonegraft  . BACK SURGERY  1996  . BYPASS GRAFT FEMORAL-PERONEAL Left 11/11/2016   Procedure: REDO LEFT FEMORAL-PERONEAL BYPASS WITH PROPATEN 6MM X 80CM GRAFT;  Surgeon: Angelia Mould, MD;  [facility];  Service: Vascular;  Laterality: Left;  . COLONOSCOPY W/ BIOPSIES AND POLYPECTOMY  08/17/2012   f/u 5 years, 4 polyps, no high grade dysplasia or malignancy, tubular adenoma, hyperplastic polyops  . EMBOLECTOMY Left 12/19/2019   Procedure: LEFT FEMERAL- PERONEAL THROMBECTOMY;  Surgeon: Marty Heck, MD;  Location: [place];  Service: Vascular;  Laterality: Left;  . ENDARTERECTOMY FEMORAL Right 04/24/2020   Procedure: RIGHT FEMORAL ENDARTERECTOMY;  Surgeon: Marty Heck, MD;  Location: [place];  Service: Vascular;  Laterality:  Right;  . ENTEROSCOPY N/A 12/11/2015   Procedure: ENTEROSCOPY;  Surgeon: Carol Ada, MD;  Location: [facility];  Service: Endoscopy;  Laterality: N/A;  . ENTEROSCOPY N/A 03/29/2018   Procedure: ENTEROSCOPY;  Surgeon: Carol Ada, MD;  Location: [facility] ENDOSCOPY;  Service: Endoscopy;  Laterality: N/A;  . ESOPHAGOGASTRODUODENOSCOPY  08/17/2012   normal esophagus and GEJ, diffuse gastritis with erythema- no malignancy, reactive gastropathy  with focal intestinal metaplasia  . FEMORAL-POPLITEAL BYPASS GRAFT Left 01/06/2016   Procedure: Left  COMMON FEMORAL-BELOW KNEE POPLITEAL ARTERY Bypass using non-reversed translocated saphenous vein graft from left leg;  Surgeon: Mal Misty, MD;  Location: [place];  Service: Vascular;  Laterality: Left;  . GIVENS CAPSULE STUDY N/A 11/24/2015   Procedure: GIVENS CAPSULE STUDY;  Surgeon: Juanita Craver, MD;  Location: [place];  Service: Endoscopy;  Laterality: N/A;  . HOT HEMOSTASIS N/A 03/29/2018   Procedure: HOT HEMOSTASIS (ARGON PLASMA COAGULATION/BICAP);  Surgeon: Carol Ada, MD;  Location: [place];  Service: Endoscopy;  Laterality: N/A;  . INGUINAL HERNIA REPAIR  1990's   right  . INTRAOPERATIVE ARTERIOGRAM Left 01/06/2016   Procedure: INTRA OPERATIVE ARTERIOGRAM LEFT LOWER LEG;  Surgeon: Mal Misty, MD;  Location: [place];  Service: Vascular;  Laterality: Left;  . INTRAOPERATIVE ARTERIOGRAM Left 11/11/2016   Procedure: INTRA OPERATIVE ARTERIOGRAM LEFT LOWER EXTRIMITY;  Surgeon: Angelia Mould, MD;  Location: [place];  Service: Vascular;  Laterality: Left;  . IR ANGIOGRAM FOLLOW UP STUDY  12/11/2017  . IR GENERIC HISTORICAL  10/24/2016   IR ANGIOGRAM FOLLOW UP STUDY  . LOWER EXTREMITY ANGIOGRAM Bilateral 07/30/2015   Procedure: Lower Extremity Angiogram;  Surgeon: Conrad [place], MD;  Location: [place] [facility];  Service: Cardiovascular;  Laterality: Bilateral;  . [place]   "lower"  . PATCH ANGIOPLASTY Left 12/19/2019    Procedure: LEFT FEMORAL -PERONEAL PATCH ANGIOPLASTY WITH XENOSURE BIOLOGIC PATCH  ;  Surgeon: Marty Heck, MD;  Location: [place];  Service: Vascular;  Laterality: Left;  . PATCH ANGIOPLASTY Right 04/24/2020   Procedure: Patch Angioplasty of Right Femoral Artery using Long Xenosure Biologic Patch 1cm x 14 cm;  Surgeon: Marty Heck, MD;  Location: [facility] OR;  Service: Vascular;  Laterality: Right;  . PERIPHERAL VASCULAR CATHETERIZATION N/A 07/30/2015   Procedure: Abdominal Aortogram;  Surgeon: Conrad [place], MD;  Location: [place] [facility];  Service: Cardiovascular;  Laterality: N/A;  . PERIPHERAL VASCULAR CATHETERIZATION N/A 10/24/2016   Procedure: Abdominal Aortogram;  Surgeon: Angelia Mould, MD;  Location: [place] [facility];  Service: Cardiovascular;  Laterality: N/A;  . PERIPHERAL VASCULAR CATHETERIZATION N/A 10/24/2016   Procedure: Lower Extremity Angiography;  Surgeon: Angelia Mould, MD;  Location: [place] [facility];  Service: Cardiovascular;  Laterality: N/A;  . PERIPHERAL VASCULAR INTERVENTION Right 12/11/2017   Procedure: PERIPHERAL VASCULAR INTERVENTION;  Surgeon: Angelia Mould, MD;  Location: [place] [facility];  Service: Cardiovascular;  Laterality: Right;  [facility] VASCULAR  SURGERY  ~ 2007   Stent SFA   . VEIN HARVEST Left 01/06/2016   Procedure: LEFT GREATER [place];  Surgeon: Mal Misty, MD;  Location: [facility] OR;  Service: Vascular;  Laterality: Left;    Family History  Problem Relation Age of Onset  . Cancer Mother        colon Cancer  . Hyperlipidemia Mother   . Diabetes Mother   . Heart disease Father   . Hypertension Father   . Cancer Sister        Uterine  . Diabetes Sister   . Diabetes Brother     Social History:  reports that he has been smoking cigars. He has smoked for the past 24.00 years. He has never used smokeless tobacco. He reports current alcohol use of about 2.0 standard drinks of alcohol per week. He reports  that he does not use drugs.  Allergies:  Allergies  Allergen Reactions  . Glipizide Other (See Comments)    REACTION IS SIDE EFFECT Severe hypoglycemia to 40s.     Medications:  Scheduled: . sodium chloride   Intravenous Once  . nicotine  14 mg Transdermal Daily  . pantoprazole (PROTONIX) IV  40 mg Intravenous Daily   Continuous: . lactated ringers      Results for orders placed or performed during the hospital encounter of 12/04/20 (from the past 24 hour(s))  Comprehensive metabolic panel     Status: Abnormal   Collection Time: 12/04/20  9:30 AM  Result Value Ref Range   Sodium 136 135 - 145 mmol/L   Potassium 3.7 3.5 - 5.1 mmol/L   Chloride 102 98 - 111 mmol/L   CO2 21 (L) 22 - 32 mmol/L   Glucose, Bld 104 (H) 70 - 99 mg/dL   BUN 8 8 - 23 mg/dL   Creatinine, Ser 0.85 0.61 - 1.24 mg/dL   Calcium 9.2 8.9 - 10.3 mg/dL   Total Protein 7.4 6.5 - 8.1 g/dL   Albumin 4.0 3.5 - 5.0 g/dL   AST 19 15 - 41 U/L   ALT 10 0 - 44 U/L   Alkaline Phosphatase 50 38 - 126 U/L   Total Bilirubin 0.3 0.3 - 1.2 mg/dL   GFR, Estimated >60 >60 mL/min   Anion gap 13 5 - 15  CBC     Status: Abnormal   Collection Time: 12/04/20  9:30 AM  Result Value Ref Range   WBC 4.8 4.0 - 10.5 K/uL   RBC 4.20 (L) 4.22 - 5.81 MIL/uL   Hemoglobin 5.6 (LL) 13.0 - 17.0 g/dL   HCT 24.0 (L) 39.0 - 52.0 %   MCV 57.1 (L) 80.0 - 100.0 fL   MCH 13.3 (L) 26.0 - 34.0 pg   MCHC 23.3 (L) 30.0 - 36.0 g/dL   RDW 25.3 (H) 11.5 - 15.5 %   Platelets 286 150 - 400 K/uL   nRBC 0.0 0.0 - 0.2 %  Type and screen [place]     Status: None (Preliminary result)   Collection Time: 12/04/20  9:30 AM  Result Value Ref Range   ABO/RH(D) A POS    Antibody Screen NEG    Sample Expiration 12/07/2020,2359    Unit Number [id number]    Blood Component Type RED CELLS,LR    Unit division 00    Status of Unit ISSUED    Transfusion Status OK TO TRANSFUSE    Crossmatch Result      Compatible Performed at  [facility], [place] [address], [place], [place] [address]    Unit Number [id number]    Blood Component Type RED CELLS,LR    Unit division 00    Status of Unit ISSUED    Transfusion Status OK TO TRANSFUSE    Crossmatch Result Compatible   Prepare RBC (crossmatch)     Status: None   Collection Time: 12/04/20 11:05 AM  Result Value Ref Range   Order Confirmation      ORDER PROCESSED BY BLOOD BANK Performed at [facility], [place] [address] [place], [place] [address]   POC occult blood, ED     Status: None   Collection Time: 12/04/20 11:15 AM  Result Value Ref Range   Fecal Occult Bld NEGATIVE NEGATIVE  Resp Panel by RT-PCR (Flu A&B, Covid) Nasopharyngeal Swab     Status: None   Collection Time: 12/04/20 12:15 PM   Specimen: Nasopharyngeal Swab; Nasopharyngeal(NP) swabs in vial transport medium  Result Value Ref Range   SARS Coronavirus 2 by RT PCR NEGATIVE NEGATIVE   Influenza A by PCR NEGATIVE NEGATIVE   Influenza B by PCR NEGATIVE NEGATIVE  CBG monitoring, ED     Status: None   Collection Time: 12/04/20  4:06 PM  Result Value Ref Range   Glucose-Capillary 96 70 - 99 mg/dL   Comment 1 Notify RN    Comment 2 Document in Chart      No results found.  ROS:  As stated above in the HPI otherwise negative.  Blood pressure (!) 150/73, pulse 63, temperature 98.6 F (37 C), temperature source Oral, resp. rate (!) 26, SpO2 100 %.    PE: Gen: NAD, Alert and Oriented HEENT:  [place]/AT, EOMI Neck: Supple, no LAD Lungs: CTA Bilaterally CV: RRR without M/G/R ABD: Soft, NTND, +BS Ext: No C/C/E  Assessment/Plan: 1) Severe IDA. 2) History of bleeding AVMs. 3) Personal history of polyps.     The patient has a long history of IDA.  Only the 2016 SBE was positive for active bleeding.  His baseline HGB is typically in the 8 range.  It is reasonable to repeat an endoscopic work up for any bleeding source.  Plan: 1) EGD/colonoscopy with Dr. Fuller Plan tomorrow. Jameka Ivie  D 12/04/2020, 5:07 PM

## 2020-12-04 NOTE — Progress Notes (Addendum)
Paged Dr Standley Dakins regarding vascular recommendations and potential inpatient vascular procedure. I explained that pt he had absent peripheral pulses with absent flow on bedside dopplers with ED provider, however warm and well perfused LE with normal motor and sensory function. He said he has a low suspicion for acute ischemic limb and that it sounds more chronic and likely exacerbated by patient's acute anemia.  He said he will see the patient tomorrow morning and may do an aortogram next week.  Lattie Haw MD PGY-2, Family Medicine

## 2020-12-04 NOTE — Progress Notes (Unsigned)
Called patient and discussed his lab results.  Given his leg pain and history of POAD with critical limb ischemia 5/21, I'm concerned that this low hgb is tied to his LE pain currently. Recommended patient go directly to ED for further anemia workup and evaluation as well as a likely blood transfusion.  He endorsed understanding and agreement.

## 2020-12-04 NOTE — Consult Note (Signed)
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  He was evaluated by his PCP earlier this week for complaints of leg pain.  Basic blood work showed that he was severely anemic and he was asked to present to the ER.  In the ER his HGB was noted to be at 5.6 g/dL with an MCV of 57.  He denied any chest pain or SOB.  He does not take any NSAIDs and he does not complain of any recent GERD, hematochezia, or melena.  In the past he was evaluated for IDA.  The most recent SBE was on 03/29/2018 and it was negative for any overt bleeding.  Nonbleeding AVMs were found and ablated.  The VCE on 01/07/2016 was negative for any bleeding source.  The only time that active bleeding was found was with the SBE on 12/11/2015.  At that time in the second portion of the duodenum fresh blood was found.  Careful examination of the area was not able to isolate the exact source.  A nearby AVM was ablated, but it was not definitive that this was the source of bleeding.  The area was tattooed with SPOT.  His last colonoscopy was with Dr. Collene Mares on 08/17/2012 with findings of some small adenomas and he was advised to have a 5 year colonoscopy at that time.  Past Medical History:  Diagnosis Date  . Anemia   . Arthritis    OA  . Cervical radiculopathy    Dr. Vertell Limber neurosurgery  . Chronic lower back pain   . Claudication of both lower extremities ([place]) 07/15/2015  . Critical lower limb ischemia ([place]) 12/19/2019  . Diabetes mellitus    takes Metformin daily  . GERD (gastroesophageal reflux disease)    takes Protonix daily  . History of blood transfusion    "related to low HgB" ((09/10/2015  . Hyperlipidemia    takes Vytorin daily  . Hypertension    takes Benazepril and Bystolic daily  . PAD (peripheral artery disease)  ([place])   . Pneumonia   . Radiculopathy, cervical region 02/11/2017  . Shortness of breath dyspnea    with exertion  . Tobacco user   . Toe fracture, right 05/09/2011  . Wears glasses     Past Surgical History:  Procedure Laterality Date  . ABDOMINAL AORTOGRAM W/LOWER EXTREMITY N/A 12/11/2017   Procedure: ABDOMINAL AORTOGRAM W/LOWER EXTREMITY;  Surgeon: Angelia Mould, MD;  Location: [place] [facility];  Service: Cardiovascular;  Laterality: N/A;  . ABDOMINAL AORTOGRAM W/LOWER EXTREMITY Bilateral 04/22/2020   Procedure: ABDOMINAL AORTOGRAM W/LOWER EXTREMITY;  Surgeon: Marty Heck, MD;  Location: [place] [facility];  Service: Cardiovascular;  Laterality: Bilateral;  . ANTERIOR CERVICAL DECOMP/DISCECTOMY FUSION  03/08/12   C6-7  . ANTERIOR CERVICAL DECOMP/DISCECTOMY FUSION  03/08/2012   Procedure: ANTERIOR CERVICAL DECOMPRESSION/DISCECTOMY FUSION 1 LEVEL/HARDWARE REMOVAL;  Surgeon: Erline Levine, MD;  Location: [place] [facility];  Service: Neurosurgery;  Laterality: N/A;  revison of C5-7 anterior cervical decompression with fusion with Cervical Five-Thoracic One anterior cervical decompression with fusion with interbody prothesis plating and bonegraft  . BACK SURGERY  1996  . BYPASS GRAFT FEMORAL-PERONEAL Left 11/11/2016   Procedure: REDO LEFT FEMORAL-PERONEAL BYPASS WITH PROPATEN 6MM X 80CM GRAFT;  Surgeon: Angelia Mould, MD;  Location: [facility]  OR;  Service: Vascular;  Laterality: Left;  . COLONOSCOPY W/ BIOPSIES AND POLYPECTOMY  08/17/2012   f/u 5 years, 4 polyps, no high grade dysplasia or malignancy, tubular adenoma, hyperplastic polyops  . EMBOLECTOMY Left 12/19/2019   Procedure: LEFT FEMERAL- PERONEAL THROMBECTOMY;  Surgeon: Marty Heck, MD;  Location: [place];  Service: Vascular;  Laterality: Left;  . ENDARTERECTOMY FEMORAL Right 04/24/2020   Procedure: RIGHT FEMORAL ENDARTERECTOMY;  Surgeon: Marty Heck, MD;  Location: [place];  Service: Vascular;  Laterality:  Right;  . ENTEROSCOPY N/A 12/11/2015   Procedure: ENTEROSCOPY;  Surgeon: Carol Ada, MD;  Location: [facility];  Service: Endoscopy;  Laterality: N/A;  . ENTEROSCOPY N/A 03/29/2018   Procedure: ENTEROSCOPY;  Surgeon: Carol Ada, MD;  Location: [facility] ENDOSCOPY;  Service: Endoscopy;  Laterality: N/A;  . ESOPHAGOGASTRODUODENOSCOPY  08/17/2012   normal esophagus and GEJ, diffuse gastritis with erythema- no malignancy, reactive gastropathy  with focal intestinal metaplasia  . FEMORAL-POPLITEAL BYPASS GRAFT Left 01/06/2016   Procedure: Left  COMMON FEMORAL-BELOW KNEE POPLITEAL ARTERY Bypass using non-reversed translocated saphenous vein graft from left leg;  Surgeon: Mal Misty, MD;  Location: [place];  Service: Vascular;  Laterality: Left;  . GIVENS CAPSULE STUDY N/A 11/24/2015   Procedure: GIVENS CAPSULE STUDY;  Surgeon: Juanita Craver, MD;  Location: [place];  Service: Endoscopy;  Laterality: N/A;  . HOT HEMOSTASIS N/A 03/29/2018   Procedure: HOT HEMOSTASIS (ARGON PLASMA COAGULATION/BICAP);  Surgeon: Carol Ada, MD;  Location: [place];  Service: Endoscopy;  Laterality: N/A;  . INGUINAL HERNIA REPAIR  1990's   right  . INTRAOPERATIVE ARTERIOGRAM Left 01/06/2016   Procedure: INTRA OPERATIVE ARTERIOGRAM LEFT LOWER LEG;  Surgeon: Mal Misty, MD;  Location: [place];  Service: Vascular;  Laterality: Left;  . INTRAOPERATIVE ARTERIOGRAM Left 11/11/2016   Procedure: INTRA OPERATIVE ARTERIOGRAM LEFT LOWER EXTRIMITY;  Surgeon: Angelia Mould, MD;  Location: [place];  Service: Vascular;  Laterality: Left;  . IR ANGIOGRAM FOLLOW UP STUDY  12/11/2017  . IR GENERIC HISTORICAL  10/24/2016   IR ANGIOGRAM FOLLOW UP STUDY  . LOWER EXTREMITY ANGIOGRAM Bilateral 07/30/2015   Procedure: Lower Extremity Angiogram;  Surgeon: Conrad [place], MD;  Location: [place] [facility];  Service: Cardiovascular;  Laterality: Bilateral;  . [place]   "lower"  . PATCH ANGIOPLASTY Left 12/19/2019    Procedure: LEFT FEMORAL -PERONEAL PATCH ANGIOPLASTY WITH XENOSURE BIOLOGIC PATCH  ;  Surgeon: Marty Heck, MD;  Location: [place];  Service: Vascular;  Laterality: Left;  . PATCH ANGIOPLASTY Right 04/24/2020   Procedure: Patch Angioplasty of Right Femoral Artery using Long Xenosure Biologic Patch 1cm x 14 cm;  Surgeon: Marty Heck, MD;  Location: [facility] OR;  Service: Vascular;  Laterality: Right;  . PERIPHERAL VASCULAR CATHETERIZATION N/A 07/30/2015   Procedure: Abdominal Aortogram;  Surgeon: Conrad Teresita, MD;  Location: [place] [facility];  Service: Cardiovascular;  Laterality: N/A;  . PERIPHERAL VASCULAR CATHETERIZATION N/A 10/24/2016   Procedure: Abdominal Aortogram;  Surgeon: Angelia Mould, MD;  Location: [place] [facility];  Service: Cardiovascular;  Laterality: N/A;  . PERIPHERAL VASCULAR CATHETERIZATION N/A 10/24/2016   Procedure: Lower Extremity Angiography;  Surgeon: Angelia Mould, MD;  Location: [place] [facility];  Service: Cardiovascular;  Laterality: N/A;  . PERIPHERAL VASCULAR INTERVENTION Right 12/11/2017   Procedure: PERIPHERAL VASCULAR INTERVENTION;  Surgeon: Angelia Mould, MD;  Location: [place] [facility];  Service: Cardiovascular;  Laterality: Right;  [facility] VASCULAR  SURGERY  ~ 2007   Stent SFA   . VEIN HARVEST Left 01/06/2016   Procedure: LEFT GREATER [place];  Surgeon: Mal Misty, MD;  Location: [facility] OR;  Service: Vascular;  Laterality: Left;    Family History  Problem Relation Age of Onset  . Cancer Mother        colon Cancer  . Hyperlipidemia Mother   . Diabetes Mother   . Heart disease Father   . Hypertension Father   . Cancer Sister        Uterine  . Diabetes Sister   . Diabetes Brother     Social History:  reports that he has been smoking cigars. He has smoked for the past 24.00 years. He has never used smokeless tobacco. He reports current alcohol use of about 2.0 standard drinks of alcohol per week. He reports  that he does not use drugs.  Allergies:  Allergies  Allergen Reactions  . Glipizide Other (See Comments)    REACTION IS SIDE EFFECT Severe hypoglycemia to 40s.     Medications:  Scheduled: . sodium chloride   Intravenous Once  . nicotine  14 mg Transdermal Daily  . pantoprazole (PROTONIX) IV  40 mg Intravenous Daily   Continuous: . lactated ringers      Results for orders placed or performed during the hospital encounter of 12/04/20 (from the past 24 hour(s))  Comprehensive metabolic panel     Status: Abnormal   Collection Time: 12/04/20  9:30 AM  Result Value Ref Range   Sodium 136 135 - 145 mmol/L   Potassium 3.7 3.5 - 5.1 mmol/L   Chloride 102 98 - 111 mmol/L   CO2 21 (L) 22 - 32 mmol/L   Glucose, Bld 104 (H) 70 - 99 mg/dL   BUN 8 8 - 23 mg/dL   Creatinine, Ser 0.85 0.61 - 1.24 mg/dL   Calcium 9.2 8.9 - 10.3 mg/dL   Total Protein 7.4 6.5 - 8.1 g/dL   Albumin 4.0 3.5 - 5.0 g/dL   AST 19 15 - 41 U/L   ALT 10 0 - 44 U/L   Alkaline Phosphatase 50 38 - 126 U/L   Total Bilirubin 0.3 0.3 - 1.2 mg/dL   GFR, Estimated >60 >60 mL/min   Anion gap 13 5 - 15  CBC     Status: Abnormal   Collection Time: 12/04/20  9:30 AM  Result Value Ref Range   WBC 4.8 4.0 - 10.5 K/uL   RBC 4.20 (L) 4.22 - 5.81 MIL/uL   Hemoglobin 5.6 (LL) 13.0 - 17.0 g/dL   HCT 24.0 (L) 39.0 - 52.0 %   MCV 57.1 (L) 80.0 - 100.0 fL   MCH 13.3 (L) 26.0 - 34.0 pg   MCHC 23.3 (L) 30.0 - 36.0 g/dL   RDW 25.3 (H) 11.5 - 15.5 %   Platelets 286 150 - 400 K/uL   nRBC 0.0 0.0 - 0.2 %  Type and screen [place]     Status: None (Preliminary result)   Collection Time: 12/04/20  9:30 AM  Result Value Ref Range   ABO/RH(D) A POS    Antibody Screen NEG    Sample Expiration 12/07/2020,2359    Unit Number [id number]    Blood Component Type RED CELLS,LR    Unit division 00    Status of Unit ISSUED    Transfusion Status OK TO TRANSFUSE    Crossmatch Result      Compatible Performed at  [facility], [place] [address] [place], [place] [address]    Unit Number [id number]    Blood Component Type RED CELLS,LR    Unit division 00    Status of Unit ISSUED    Transfusion Status OK TO TRANSFUSE    Crossmatch Result Compatible   Prepare RBC (crossmatch)     Status: None   Collection Time: 12/04/20 11:05 AM  Result Value Ref Range   Order Confirmation      ORDER PROCESSED BY BLOOD BANK Performed at [facility], [place] [address] [place], [place] [address]   POC occult blood, ED     Status: None   Collection Time: 12/04/20 11:15 AM  Result Value Ref Range   Fecal Occult Bld NEGATIVE NEGATIVE  Resp Panel by RT-PCR (Flu A&B, Covid) Nasopharyngeal Swab     Status: None   Collection Time: 12/04/20 12:15 PM   Specimen: Nasopharyngeal Swab; Nasopharyngeal(NP) swabs in vial transport medium  Result Value Ref Range   SARS Coronavirus 2 by RT PCR NEGATIVE NEGATIVE   Influenza A by PCR NEGATIVE NEGATIVE   Influenza B by PCR NEGATIVE NEGATIVE  CBG monitoring, ED     Status: None   Collection Time: 12/04/20  4:06 PM  Result Value Ref Range   Glucose-Capillary 96 70 - 99 mg/dL   Comment 1 Notify RN    Comment 2 Document in Chart      No results found.  ROS:  As stated above in the HPI otherwise negative.  Blood pressure (!) 150/73, pulse 63, temperature 98.6 F (37 C), temperature source Oral, resp. rate (!) 26, SpO2 100 %.    PE: Gen: NAD, Alert and Oriented HEENT:  [place]/AT, EOMI Neck: Supple, no LAD Lungs: CTA Bilaterally CV: RRR without M/G/R ABD: Soft, NTND, +BS Ext: No C/C/E  Assessment/Plan: 1) Severe IDA. 2) History of bleeding AVMs. 3) Personal history of polyps.     The patient has a long history of IDA.  Only the 2016 SBE was positive for active bleeding.  His baseline HGB is typically in the 8 range.  It is reasonable to repeat an endoscopic work up for any bleeding source.  Plan: 1) EGD/colonoscopy with Dr. Fuller Plan tomorrow. Easter Schinke  D 12/04/2020, 5:07 PM

## 2020-12-04 NOTE — ED Triage Notes (Signed)
Patient arrives to ED with c/o right leg pain and low hemoglobin. States right leg pain has gotten worse over last 3-4 months, has hx of PAD. Stated that his routine blood work showed a hemoglobin of 6.1. Pt denies melena, abd pain, and fevers. States weaker than normal.

## 2020-12-04 NOTE — ED Provider Notes (Signed)
[name] EMERGENCY DEPARTMENT Provider Note   CSN: [id number] Arrival date & time: 12/04/20  2297     History Chief Complaint  Patient presents with  . Abnormal Lab    [name] is a 69 y.o. male with a past medical history of diabetes, tobacco abuse, cervical radiculopathy, chronic low back pain, significant peripheral arterial disease status post femoropopliteal bypass on the left as well as right iliac stent and right femoral endarterectomy with femoral peroneal bypass graft most recently in April 2021 due to critical limb ischemia by Dr. Carlis Abbott.  He presents today for twofold issues.  First he was told that his blood levels had dropped again.  He is unsure why he has issues with anemia.  He denies melena or hematochezia.  He is on chronic anticoagulation with Plavix.  He denies shortness of breath, cold intolerance or weakness. Secondly, he complains of bilateral leg pain worse on the right side.  He states that his pain is not improved after his intervention in April of this past year.  He complains of chronic, constant bilateral limb pain which she describes as burning, aching and cramping.  Pain is predominantly on the medial right thigh and goes down the leg.  He states that the pain wakes him out of his sleep and he has a lot of difficulty sleeping.  It is worse with ambulation and improved with rest.  He denies any acute changes in his pain.  He denies fevers, chills.  HPI     Past Medical History:  Diagnosis Date  . Anemia   . Arthritis    OA  . Cervical radiculopathy    Dr. Vertell Limber neurosurgery  . Chronic lower back pain   . Diabetes mellitus    takes Metformin daily  . GERD (gastroesophageal reflux disease)    takes Protonix daily  . History of blood transfusion    "related to low HgB" ((09/10/2015  . Hyperlipidemia    takes Vytorin daily  . Hypertension    takes Benazepril and Bystolic daily  . PAD (peripheral artery disease) ([place])   .  Pneumonia   . Shortness of breath dyspnea    with exertion  . Tobacco user   . Toe fracture, right 05/09/2011  . Wears glasses     Patient Active Problem List   Diagnosis Date Noted  . Encounter to vaccinate patient 10/05/2020  . PAOD (peripheral arterial occlusive disease) ([place]) 04/24/2020  . Adhesive capsulitis 03/10/2020  . Critical lower limb ischemia ([place]) 12/19/2019  . Ischemia of foot 12/11/2019  . Acute pain of left shoulder 11/18/2019  . Lung nodule < 6cm on CT 01/28/2019  . Type 2 diabetes with complication ([place]) 98/92/1194  . Radiculopathy, cervical region 02/11/2017  . Depression 10/03/2016  . Vision changes 02/02/2016  . Claudication of both lower extremities ([place]) 07/15/2015  . Proteinuria 08/27/2014  . Diabetic neuropathy ([place]) 02/27/2014  . Iron deficiency anemia 08/01/2012  . OBESITY, NOS 02/22/2007  . Tobacco abuse 02/22/2007  . HYPERTENSION, BENIGN SYSTEMIC 02/22/2007  . PAD (peripheral artery disease) ([place]) 02/22/2007  . Osteoarthritis 02/22/2007    Past Surgical History:  Procedure Laterality Date  . ABDOMINAL AORTOGRAM W/LOWER EXTREMITY N/A 12/11/2017   Procedure: ABDOMINAL AORTOGRAM W/LOWER EXTREMITY;  Surgeon: Angelia Mould, MD;  Location: [place] CV LAB;  Service: Cardiovascular;  Laterality: N/A;  . ABDOMINAL AORTOGRAM W/LOWER EXTREMITY Bilateral 04/22/2020   Procedure: ABDOMINAL AORTOGRAM W/LOWER EXTREMITY;  Surgeon: Marty Heck, MD;  Location:  [place] INVASIVE CV LAB;  Service: Cardiovascular;  Laterality: Bilateral;  . ANTERIOR CERVICAL DECOMP/DISCECTOMY FUSION  03/08/12   C6-7  . ANTERIOR CERVICAL DECOMP/DISCECTOMY FUSION  03/08/2012   Procedure: ANTERIOR CERVICAL DECOMPRESSION/DISCECTOMY FUSION 1 LEVEL/HARDWARE REMOVAL;  Surgeon: Erline Levine, MD;  Location: [place] [facility];  Service: Neurosurgery;  Laterality: N/A;  revison of C5-7 anterior cervical decompression with fusion with Cervical Five-Thoracic One anterior cervical  decompression with fusion with interbody prothesis plating and bonegraft  . BACK SURGERY  1996  . BYPASS GRAFT FEMORAL-PERONEAL Left 11/11/2016   Procedure: REDO LEFT FEMORAL-PERONEAL BYPASS WITH PROPATEN 6MM X 80CM GRAFT;  Surgeon: Angelia Mould, MD;  Location: [facility] OR;  Service: Vascular;  Laterality: Left;  . COLONOSCOPY W/ BIOPSIES AND POLYPECTOMY  08/17/2012   f/u 5 years, 4 polyps, no high grade dysplasia or malignancy, tubular adenoma, hyperplastic polyops  . EMBOLECTOMY Left 12/19/2019   Procedure: LEFT FEMERAL- PERONEAL THROMBECTOMY;  Surgeon: Marty Heck, MD;  Location: [place];  Service: Vascular;  Laterality: Left;  . ENDARTERECTOMY FEMORAL Right 04/24/2020   Procedure: RIGHT FEMORAL ENDARTERECTOMY;  Surgeon: Marty Heck, MD;  Location: [place];  Service: Vascular;  Laterality: Right;  . ENTEROSCOPY N/A 12/11/2015   Procedure: ENTEROSCOPY;  Surgeon: Carol Ada, MD;  Location: [facility] ENDOSCOPY;  Service: Endoscopy;  Laterality: N/A;  . ENTEROSCOPY N/A 03/29/2018   Procedure: ENTEROSCOPY;  Surgeon: Carol Ada, MD;  Location: [facility] ENDOS[facility];  Service: Endoscopy;  Laterality: N/A;  . ESOPHAGOGASTRODUODENOSCOPY  08/17/2012   normal esophagus and GEJ, diffuse gastritis with erythema- no malignancy, reactive gastropathy  with focal intestinal metaplasia  . FEMORAL-POPLITEAL BYPASS GRAFT Left 01/06/2016   Procedure: Left  COMMON FEMORAL-BELOW KNEE POPLITEAL ARTERY Bypass using non-reversed translocated saphenous vein graft from left leg;  Surgeon: Mal Misty, MD;  Location: [place];  Service: Vascular;  Laterality: Left;  . GIVENS CAPSULE STUDY N/A 11/24/2015   Procedure: GIVENS CAPSULE STUDY;  Surgeon: Juanita Craver, MD;  Location: [place];  Service: Endoscopy;  Laterality: N/A;  . HOT HEMOSTASIS N/A 03/29/2018   Procedure: HOT HEMOSTASIS (ARGON PLASMA COAGULATION/BICAP);  Surgeon: Carol Ada, MD;  Location: [place];  Service: Endoscopy;  Laterality: N/A;  .  INGUINAL HERNIA REPAIR  1990's   right  . INTRAOPERATIVE ARTERIOGRAM Left 01/06/2016   Procedure: INTRA OPERATIVE ARTERIOGRAM LEFT LOWER LEG;  Surgeon: Mal Misty, MD;  Location: [place];  Service: Vascular;  Laterality: Left;  . INTRAOPERATIVE ARTERIOGRAM Left 11/11/2016   Procedure: INTRA OPERATIVE ARTERIOGRAM LEFT LOWER EXTRIMITY;  Surgeon: Angelia Mould, MD;  Location: [place];  Service: Vascular;  Laterality: Left;  . IR ANGIOGRAM FOLLOW UP STUDY  12/11/2017  . IR GENERIC HISTORICAL  10/24/2016   IR ANGIOGRAM FOLLOW UP STUDY  . LOWER EXTREMITY ANGIOGRAM Bilateral 07/30/2015   Procedure: Lower Extremity Angiogram;  Surgeon: Conrad [place], MD;  Location: [place] CV LAB;  Service: Cardiovascular;  Laterality: Bilateral;  . [place]   "lower"  . PATCH ANGIOPLASTY Left 12/19/2019   Procedure: LEFT FEMORAL -PERONEAL PATCH ANGIOPLASTY WITH XENOSURE BIOLOGIC PATCH  ;  Surgeon: Marty Heck, MD;  Location: [place];  Service: Vascular;  Laterality: Left;  . PATCH ANGIOPLASTY Right 04/24/2020   Procedure: Patch Angioplasty of Right Femoral Artery using Long Xenosure Biologic Patch 1cm x 14 cm;  Surgeon: Marty Heck, MD;  Location: [facility] OR;  Service: Vascular;  Laterality: Right;  . PERIPHERAL VASCULAR CATHETERIZATION N/A 07/30/2015   Procedure: Abdominal  Aortogram;  Surgeon: Conrad [place], MD;  Location: [place] CV LAB;  Service: Cardiovascular;  Laterality: N/A;  . PERIPHERAL VASCULAR CATHETERIZATION N/A 10/24/2016   Procedure: Abdominal Aortogram;  Surgeon: Angelia Mould, MD;  Location: [place] CV LAB;  Service: Cardiovascular;  Laterality: N/A;  . PERIPHERAL VASCULAR CATHETERIZATION N/A 10/24/2016   Procedure: Lower Extremity Angiography;  Surgeon: Angelia Mould, MD;  Location: [place] CV LAB;  Service: Cardiovascular;  Laterality: N/A;  . PERIPHERAL VASCULAR INTERVENTION Right 12/11/2017   Procedure: PERIPHERAL VASCULAR INTERVENTION;   Surgeon: Angelia Mould, MD;  Location: [place] CV LAB;  Service: Cardiovascular;  Laterality: Right;  Marland Kitchen VASCULAR SURGERY  ~ 2007   Stent SFA   . VEIN HARVEST Left 01/06/2016   Procedure: LEFT GREATER [place];  Surgeon: Mal Misty, MD;  Location: [facility] OR;  Service: Vascular;  Laterality: Left;       Family History  Problem Relation Age of Onset  . Cancer Mother        colon Cancer  . Hyperlipidemia Mother   . Diabetes Mother   . Heart disease Father   . Hypertension Father   . Cancer Sister        Uterine  . Diabetes Sister   . Diabetes Brother     Social History   Tobacco Use  . Smoking status: Current Some Day Smoker    Years: 24.00    Types: Cigars  . Smokeless tobacco: Never Used  Vaping Use  . Vaping Use: Never used  Substance Use Topics  . Alcohol use: Yes    Alcohol/week: 2.0 standard drinks    Types: 2 Cans of beer per week  . Drug use: No    Home Medications Prior to Admission medications   Medication Sig Start Date End Date Taking? Authorizing Provider  aspirin EC 81 MG EC tablet Take 1 tablet (81 mg total) by mouth daily. 12/21/19   Marty Heck, MD  atorvastatin (LIPITOR) 40 MG tablet TAKE 1 TABLET BY MOUTH ONCE DAILY 09/18/20   Ouida Sills, Chelsey L, DO  benazepril (LOTENSIN) 5 MG tablet Take 1 tablet (5 mg total) by mouth daily. 11/17/20   Anderson, Chelsey L, DO  cilostazol (PLETAL) 50 MG tablet Take 1 tablet (50 mg total) by mouth 2 (two) times daily. 11/30/20   Anderson, Chelsey L, DO  clopidogrel (PLAVIX) 75 MG tablet TAKE 1 TABLET BY MOUTH EVERY DAY 10/19/20   Anderson, Chelsey L, DO  [employer] ACETAMINOPHEN EX ST 500 MG tablet TAKE 2 TABLETS (1,000 MG TOTAL) BY MOUTH EVERY 8 (EIGHT) HOURS AS NEEDED FOR MODERATE PAIN. Patient taking differently: Take 1,000 mg by mouth every 8 (eight) hours as needed for moderate pain.  04/17/19   Anderson, Chelsey L, DO  diclofenac Sodium (VOLTAREN) 1 % GEL Apply 2 g topically 4 (four)  times daily. 05/27/20   Bonnita Hollow, MD  ezetimibe (ZETIA) 10 MG tablet TAKE 1 TABLET BY MOUTH ONCE DAILY 11/13/20   Doristine Mango L, DO  FLUoxetine (PROZAC) 10 MG capsule Take 1 capsule (10 mg total) by mouth daily. 08/20/20   Anderson, Chelsey L, DO  gabapentin (NEURONTIN) 300 MG capsule TAKE ONE (1) CAPSULE BY MOUTH EVERY NIGHT AT BEDTIME AS NEEDED 11/13/20   Doristine Mango L, DO  meloxicam (MOBIC) 7.5 MG tablet Take 1 tablet (7.5 mg total) by mouth daily as needed for up to 30 doses for pain. 10/13/20   Aundra Dubin, PA-C  metFORMIN (  GLUCOPHAGE) 1000 MG tablet TAKE ONE (1) TABLET BY MOUTH TWICE DAILY WITH MEALS 08/20/20   Anderson, Chelsey L, DO  naproxen (NAPROSYN) 500 MG tablet TAKE 1 PILL TWICE A DAY FOR 5 DAY. THEN TAKE AS NEEDED. 07/20/20   Doristine Mango L, DO  Nebivolol HCl (BYSTOLIC) 20 MG TABS Take 1 tablet (20 mg total) by mouth daily. 11/17/20   Anderson, Chelsey L, DO  oxyCODONE-acetaminophen (PERCOCET/ROXICET) 5-325 MG tablet Take 1 tablet by mouth every 6 (six) hours as needed for moderate pain. Patient not taking: Reported on 09/22/2020 05/08/20   Gabriel Earing, PA-C  pantoprazole (PROTONIX) 40 MG tablet TAKE 1 TABLET BY MOUTH EVERY DAY BEFORE BREAKFAST 10/19/20   Anderson, Chelsey L, DO  ranitidine (ZANTAC 75) 75 MG tablet Take 1 tablet (75 mg total) by mouth 2 (two) times daily as needed for heartburn (heartburn, acid reflux). 01/18/19   Anderson, Chelsey L, DO  traMADol (ULTRAM) 50 MG tablet Take 1 tablet (50 mg total) by mouth every 6 (six) hours as needed (dressing changes). Patient not taking: Reported on 09/22/2020 05/19/20   Marty Heck, MD    Allergies    Glipizide  Review of Systems   Review of Systems Ten systems reviewed and are negative for acute change, except as noted in the HPI.   Physical Exam Updated Vital Signs BP (!) 136/55 (BP Location: Right Arm)   Pulse 86   Temp 98 F (36.7 C) (Oral)   Resp 16   SpO2 100%   Physical  Exam Vitals and nursing note reviewed.  Constitutional:      General: He is not in acute distress.    Appearance: He is well-developed and well-nourished. He is not diaphoretic.  HENT:     Head: Normocephalic and atraumatic.  Eyes:     General: No scleral icterus.    Conjunctiva/sclera: Conjunctivae normal.  Cardiovascular:     Rate and Rhythm: Normal rate and regular rhythm.     Pulses:          Dorsalis pedis pulses are 0 on the right side and 0 on the left side.       Posterior tibial pulses are 0 on the right side and 0 on the left side.     Heart sounds: Normal heart sounds.  Pulmonary:     Effort: Pulmonary effort is normal. No respiratory distress.     Breath sounds: Normal breath sounds.  Abdominal:     Palpations: Abdomen is soft.     Tenderness: There is no abdominal tenderness.  Musculoskeletal:        General: No edema.     Cervical back: Normal range of motion and neck supple.  Feet:     Left foot:     Toenail Condition: Left toenails are abnormally thick.     Comments: Feet are warm to the touch- no pulses by palpation or doppler Skin:    General: Skin is warm and dry.  Neurological:     Mental Status: He is alert.  Psychiatric:        Behavior: Behavior normal.     ED Results / Procedures / Treatments   Labs (all labs ordered are listed, but only abnormal results are displayed) Labs Reviewed  COMPREHENSIVE METABOLIC PANEL  CBC  POC OCCULT BLOOD, ED  TYPE AND SCREEN    EKG None  Radiology No results found.  Procedures .Critical Care Performed by: Margarita Mail, PA-C Authorized by: Margarita Mail, PA-C   Critical  care provider statement:    Critical care time (minutes):  35   Critical care time was exclusive of:  Separately billable procedures and treating other patients   Critical care was necessary to treat or prevent imminent or life-threatening deterioration of the following conditions:  Circulatory failure   Critical care was time  spent personally by me on the following activities:  Discussions with consultants, evaluation of patient's response to treatment, examination of patient, ordering and performing treatments and interventions, ordering and review of laboratory studies, ordering and review of radiographic studies, pulse oximetry, re-evaluation of patient's condition, obtaining history from patient or surrogate and review of old charts   (including critical care time)  Medications Ordered in ED Medications - No data to display  ED Course  I have reviewed the triage vital signs and the nursing notes.  Pertinent labs & imaging results that were available during my care of the patient were reviewed by me and considered in my medical decision making (see chart for details).  Clinical Course as of 12/04/20 1257  Fri Dec 04, 2020  1042 69 yo male presenting with anemia on outpatient testing.  He's had anemia in the past requiring transfusions.  No reported GI bleed, no melena at home.  He is complaining of leg pain, which appears chronic, and has a vascular history.  He has pain and cramping all the time, no changes recently in his pain.  Here he is well appearing, no acute distress.  He has poor pulses in his bilateral feet but they are feel warm and well perfused, with neuro function intact.  With no change in his pain or symptoms I doubt he has an acute vascular occlusion.  Labs notable for hgb 5.6, microcytic.  Possible GI bleed? He's on plavix.  I verbally consented him for blood transfusion - will give 2 units pRBC and admit for anemia. [MT]    Clinical Course User Index [MT] Trifan, Carola Rhine, MD   MDM Rules/Calculators/A&P                          Patient here with anemia and chronic leg pain. I have ordered and reviewed labs which included which shows and acute microcytic anemia with hgb of 5.6.  CMP without sig abnormality. Occult stool negative Feet are pulseless but appear warm and well perfused and I  doubt acute artrial occlusion. Patient seen in shared visit with Dr. Langston Masker who agrees. Patient transfused 2 units here in the ER. Patient will be admitted to the [facility] medicine service. Final Clinical Impression(s) / ED Diagnoses Final diagnoses:  None    Rx / DC Orders ED Discharge Orders    None       Margarita Mail, PA-C 12/04/20 1305    Wyvonnia Dusky, MD 12/04/20 1453

## 2020-12-04 NOTE — Anesthesia Preprocedure Evaluation (Deleted)
Anesthesia Evaluation    Airway        Dental   Pulmonary Current Smoker and Patient abstained from smoking.,           Cardiovascular hypertension, Pt. on medications and Pt. on home beta blockers + Peripheral Vascular Disease       Neuro/Psych Depression negative neurological ROS     GI/Hepatic GERD  Medicated and Controlled,  Endo/Other  diabetes, Well Controlled, Type 2, Oral Hypoglycemic Agents  Renal/GU   negative genitourinary   Musculoskeletal  (+) Arthritis , Cervical radiculopathy   Abdominal   Peds  Hematology  (+) anemia ,   Anesthesia Other Findings   Reproductive/Obstetrics                             Anesthesia Physical Anesthesia Plan  ASA: III  Anesthesia Plan: MAC   Post-op Pain Management:    Induction: Intravenous  PONV Risk Score and Plan: 0 and Propofol infusion  Airway Management Planned: Simple Face Mask, Natural Airway and Nasal Cannula  Additional Equipment: None  Intra-op Plan:   Post-operative Plan:   Informed Consent: I have reviewed the patients History and Physical, chart, labs and discussed the procedure including the risks, benefits and alternatives for the proposed anesthesia with the patient or authorized representative who has indicated his/her understanding and acceptance.     Dental advisory given  Plan Discussed with: CRNA  Anesthesia Plan Comments:         Anesthesia Quick Evaluation

## 2020-12-04 NOTE — H&P (Addendum)
[facility] Admission History and Physical Service Pager: [phone]  Patient name: [name] Medical record number: [id number] Date of birth: 09-Aug-1951 Age: 69 y.o. Gender: male  Primary Care Provider: Richarda Osmond, DO Consultants: Alfredo Martinez, GI  Code Status:  FULL  Preferred Emergency Contact:  Ernestine Mcmurray [phone]  Chief Complaint: Low hemoglobin  Assessment and Plan: [name] is a 69 y.o. male presenting with symptomatic anemia, Hb 6 in clinic and bilateral leg pain. PMH is significant for peripheral arterial disease, type 2 diabetes mellitus, depression, chronic low back pain, diabetic neuropathy, iron deficiency anemia, obesity, tobacco use, hypertension, GERD, HLD, osteoarthritis.  Iron deficiency anemia: concern for GI bleed/malignancy Patient recently saw his PCP on 12/06 for bilateral leg pain and CBC was obtained. Hgb resulted this morning which was 6.1. Pt was contacted and told to come to the ED for transfusion. Pt denies recently history of abdominal pain, melena or hematochezia. Is noted to take ASA and plavix for PAD which increases his bleeding risk. Pt does not take anticoagulants or NSAIDs. On admission, his vitals were stable. Examination without signs of overt bleeding or abdominal tenderness. Labs: Hgb 6, MCV 57.1, platelets wnl. CMP wnl. FOBT was negative. Anemia panel pending today. Last Iron studies done on 03/2018 showed Fe 13, ferritin 5 and TIBC of 554.  The most likely differential for his microcytic anemia is iron deficiency anemia 2/2 gastrointestinal disease. He follows Dr Collene Mares GI at [facility] and has had an extensive work up for the iron deficiency anemia. EGD in 2016 showed AVMs in the small intestines. EGD 2019: non bleeding angiodysplastic lesions. Colonoscopy in 2013 showed hemorrhoids and colonic polyps which adenomatous polyps. Pt is due for repeat for follow up colonoscopy now but has not arranged  appointment. GI malignancy cannot be excluded as a cause his anemia given history adenomatous polyps and FH of colon cancer (mother). He has required transfusions post-operatively post vascular surgery which is likely due to acute blood loss, see below. Other less likely differentials of anemia includes thalassemia, RBC lysis, microscopic hematuria, nutritional or other marrow deficiency. Will obtain further anemia studies to help elucidate the cause of his anemia. GI has been consulted. S/p 2 units of PRBC in the ED. Post H&H is pending.  -Admit to FPTS, unit-medical telemetry. Attending Dr. McDiarmid. -Monitor vitals per floor routine  -Up with assistance. -Large-bore IVs -NPO  -mIVF Ringer lactate at 120 mL/hr -Check H&H after transfusion. -F/u GI recs -Monitor for any signs of bleeding -IV Protonix 40 mg daily. -Maintain oxygen saturation more than 92%. -A.m. CBC, BMP, INR. -F/u Anemia panel -Hold ASA, plavix  -SCDs for DVT ppx  -PT/OT eval and treat.  Chronic Leg Pain  Peripheral artery occlusive disease:  On examination: no palpable peripheral pulses, feet are warm and well perfused. ED provider unable detect blood flow on bedside dopplers. Patient agreeable to vascular surgery procedure as in patient.  Pt follows Dr Carlis Abbott at [facility] for chronic bilateral leg pain due to peripheral artery disease. S/p post right femoral endarterectomy with profundoplasty, thrombectomy of his left femoral prosthetic bypass and right external iliac stent. Of note patient has required transfusions follow vascular surgery due to blood loss. Last seen in 09/22/20 where they discussed getting aortogram to evaluate his right external iliacartery. Pt was opposed to further procedures at this time. He then presented to PCP on 11/30/20 for worsening leg pain.  Home meds: tylenol, ASA 81mg  daily, plavix 75mg  daily, pletal  50mg  BID which patient has not yet started  -Vascular consult today, follow up  recommendations -Hold home medication (aspirin, Plavix, Pletal) as patient is n.p.o.  Type 2 diabetes On admission the blood glucose was 104. Patient's most recent HbA1c 5.6% on 11/30/2020 Home medication include Metformin 1000 mg twice daily with meals and patient reports compliance.  -Hold Metformin as patient is n.p.o. -N.p.o. -Monitor CBG daily -Consider sSI if CBG >180  Hypertension BP's 134/64 mmHg on admission.  Home meds: Includes Lotensin 5 mg tablet, Nibovolol 20mg  daily  -Hold home Lotensin and Nibovolol as patient is n.p.o..  Diabetic neuropathy Home meds: 300 mg gabapentin nightly at bedtime -Hold gabapentin as patient is n.p.o.  Depression Patient denies low mood. Home meds: Prozac 10 mg daily -Hold home Prozac as patient is n.p.o.  Tobacco use Smokes cigars 1 day for 30 years.  -Nicotine patch 14 mg daily. -Smoking cessation counseling with PCP   GERD Patient home medication includes Zantac 75 mg 2 times as needed, protonix 40mg  -Hold home med as NPO  -IV Protonix 40 mg daily due to concern for GI bleed  HLD His last lipid profile was recently done on 12/21 showed total cholesterol of 98, and triglyceride of 90.  Home meds include Lipitor 40 mg daily, zetia 10mg  daily -Hold Lipitor and zetia as patient is n.p.o.  FEN/GI: N.P.O., LR 148ml/hr Prophylaxis: SCD's  Disposition: Med Tele  History of Present Illness:  TAHJE [name] is a 69 y.o. male presenting with symptomatic anemia and bilateral leg pain.   Seen in clinic on 12/04/20 by PCP for worsening of chronic (8 year history) leg pain (worse on right). Has had extensive PAOD followed by vascular (multiple stents and bypasses) Dr Carlis Abbott. Last seen in September has a stent placed. He was told to follow up in 3 months. Was told needs surgery by Dr Carlis Abbott but he does not want more surgery. Initially pain was bilateral but now worse on right.  Patient takes tylenol for the pain as needed.  Patient has pain in  both legs but medial thigh pain radiating down legs. Pain worsened by walking and at night.  He uses cane and is only able to walk half a block without pain. No alleviating factors. Pain is 8/10 severity and burning and stabbing pain in nature.  Patient gets cramping after lying down for 1 hour. Also endorses right shoulder pain due to frozen shoulder.  He denies chest pain, SOB, dizziness, rectal bleeding or black stools.  Patient had intentional weight loss of 20 lb over 4 months, "stopped eating".  Back in September 2021, seen by vascular surgery and was found to be anemic after procedure and received 2 Units transfusion.  He has had anemia before.  He has been taking plavix and aspirin for last 15 years. For 6-7 years started by Dr Carlis Abbott. Does not take iron supplements.   Drinks EOTH every now and again. Drinks 2 beers a week. No illicit drug use. Lives alone. Lives near brother and sister. Separated from wife for a long time. Uses walking aid and walking rollator.  Covid vaccines up to date. Has had pfizer vaccines, booster 3 months ago. Denies covid exposures. No recent travel. Denies sick contacts.   Review Of Systems: Per HPI with the following additions:  Review of Systems  Constitutional: Positive for weight loss. Negative for chills and fever.  Eyes: Positive for blurred vision.  Respiratory: Negative for cough, hemoptysis, shortness of breath and wheezing.  Cardiovascular: Positive for claudication and leg swelling. Negative for chest pain, palpitations and orthopnea.  Gastrointestinal: Negative for abdominal pain, blood in stool, constipation, diarrhea, melena, nausea and vomiting.  Genitourinary: Negative for dysuria, frequency and hematuria.  Skin: Negative.   Neurological: Negative for dizziness, weakness and headaches.  Psychiatric/Behavioral: Negative for depression.    Patient Active Problem List   Diagnosis Date Noted  . Encounter to vaccinate patient 10/05/2020  . PAOD  (peripheral arterial occlusive disease) ([place]) 04/24/2020  . Adhesive capsulitis 03/10/2020  . Critical lower limb ischemia ([place]) 12/19/2019  . Ischemia of foot 12/11/2019  . Acute pain of left shoulder 11/18/2019  . Lung nodule < 6cm on CT 01/28/2019  . Type 2 diabetes with complication ([place]) 43/15/4008  . Radiculopathy, cervical region 02/11/2017  . Depression 10/03/2016  . Vision changes 02/02/2016  . Claudication of both lower extremities ([place]) 07/15/2015  . Proteinuria 08/27/2014  . Diabetic neuropathy ([place]) 02/27/2014  . Iron deficiency anemia 08/01/2012  . OBESITY, NOS 02/22/2007  . Tobacco abuse 02/22/2007  . HYPERTENSION, BENIGN SYSTEMIC 02/22/2007  . PAD (peripheral artery disease) ([place]) 02/22/2007  . Osteoarthritis 02/22/2007    Past Medical History: Past Medical History:  Diagnosis Date  . Anemia   . Arthritis    OA  . Cervical radiculopathy    Dr. Vertell Limber neurosurgery  . Chronic lower back pain   . Diabetes mellitus    takes Metformin daily  . GERD (gastroesophageal reflux disease)    takes Protonix daily  . History of blood transfusion    "related to low HgB" ((09/10/2015  . Hyperlipidemia    takes Vytorin daily  . Hypertension    takes Benazepril and Bystolic daily  . PAD (peripheral artery disease) ([place])   . Pneumonia   . Shortness of breath dyspnea    with exertion  . Tobacco user   . Toe fracture, right 05/09/2011  . Wears glasses     Past Surgical History: Past Surgical History:  Procedure Laterality Date  . ABDOMINAL AORTOGRAM W/LOWER EXTREMITY N/A 12/11/2017   Procedure: ABDOMINAL AORTOGRAM W/LOWER EXTREMITY;  Surgeon: Angelia Mould, MD;  Location: [place] [facility];  Service: Cardiovascular;  Laterality: N/A;  . ABDOMINAL AORTOGRAM W/LOWER EXTREMITY Bilateral 04/22/2020   Procedure: ABDOMINAL AORTOGRAM W/LOWER EXTREMITY;  Surgeon: Marty Heck, MD;  Location: [place] [facility];  Service: Cardiovascular;  Laterality: Bilateral;   . ANTERIOR CERVICAL DECOMP/DISCECTOMY FUSION  03/08/12   C6-7  . ANTERIOR CERVICAL DECOMP/DISCECTOMY FUSION  03/08/2012   Procedure: ANTERIOR CERVICAL DECOMPRESSION/DISCECTOMY FUSION 1 LEVEL/HARDWARE REMOVAL;  Surgeon: Erline Levine, MD;  Location: [place] [facility];  Service: Neurosurgery;  Laterality: N/A;  revison of C5-7 anterior cervical decompression with fusion with Cervical Five-Thoracic One anterior cervical decompression with fusion with interbody prothesis plating and bonegraft  . BACK SURGERY  1996  . BYPASS GRAFT FEMORAL-PERONEAL Left 11/11/2016   Procedure: REDO LEFT FEMORAL-PERONEAL BYPASS WITH PROPATEN 6MM X 80CM GRAFT;  Surgeon: Angelia Mould, MD;  Location: [facility] OR;  Service: Vascular;  Laterality: Left;  . COLONOSCOPY W/ BIOPSIES AND POLYPECTOMY  08/17/2012   f/u 5 years, 4 polyps, no high grade dysplasia or malignancy, tubular adenoma, hyperplastic polyops  . EMBOLECTOMY Left 12/19/2019   Procedure: LEFT FEMERAL- PERONEAL THROMBECTOMY;  Surgeon: Marty Heck, MD;  Location: [place];  Service: Vascular;  Laterality: Left;  . ENDARTERECTOMY FEMORAL Right 04/24/2020   Procedure: RIGHT FEMORAL ENDARTERECTOMY;  Surgeon: Marty Heck, MD;  Location: [place];  Service:  Vascular;  Laterality: Right;  . ENTEROSCOPY N/A 12/11/2015   Procedure: ENTEROSCOPY;  Surgeon: Carol Ada, MD;  Location: [facility] ENDOSCOPY;  Service: Endoscopy;  Laterality: N/A;  . ENTEROSCOPY N/A 03/29/2018   Procedure: ENTEROSCOPY;  Surgeon: Carol Ada, MD;  Location: [facility] ENDOSCOPY;  Service: Endoscopy;  Laterality: N/A;  . ESOPHAGOGASTRODUODENOSCOPY  08/17/2012   normal esophagus and GEJ, diffuse gastritis with erythema- no malignancy, reactive gastropathy  with focal intestinal metaplasia  . FEMORAL-POPLITEAL BYPASS GRAFT Left 01/06/2016   Procedure: Left  COMMON FEMORAL-BELOW KNEE POPLITEAL ARTERY Bypass using non-reversed translocated saphenous vein graft from left leg;  Surgeon: Mal Misty, MD;   Location: [place];  Service: Vascular;  Laterality: Left;  . GIVENS CAPSULE STUDY N/A 11/24/2015   Procedure: GIVENS CAPSULE STUDY;  Surgeon: Juanita Craver, MD;  Location: [place];  Service: Endoscopy;  Laterality: N/A;  . HOT HEMOSTASIS N/A 03/29/2018   Procedure: HOT HEMOSTASIS (ARGON PLASMA COAGULATION/BICAP);  Surgeon: Carol Ada, MD;  Location: [place];  Service: Endoscopy;  Laterality: N/A;  . INGUINAL HERNIA REPAIR  1990's   right  . INTRAOPERATIVE ARTERIOGRAM Left 01/06/2016   Procedure: INTRA OPERATIVE ARTERIOGRAM LEFT LOWER LEG;  Surgeon: Mal Misty, MD;  Location: [place];  Service: Vascular;  Laterality: Left;  . INTRAOPERATIVE ARTERIOGRAM Left 11/11/2016   Procedure: INTRA OPERATIVE ARTERIOGRAM LEFT LOWER EXTRIMITY;  Surgeon: Angelia Mould, MD;  Location: [place];  Service: Vascular;  Laterality: Left;  . IR ANGIOGRAM FOLLOW UP STUDY  12/11/2017  . IR GENERIC HISTORICAL  10/24/2016   IR ANGIOGRAM FOLLOW UP STUDY  . LOWER EXTREMITY ANGIOGRAM Bilateral 07/30/2015   Procedure: Lower Extremity Angiogram;  Surgeon: Conrad Hilltop, MD;  Location: [place] [facility];  Service: Cardiovascular;  Laterality: Bilateral;  . [place]   "lower"  . PATCH ANGIOPLASTY Left 12/19/2019   Procedure: LEFT FEMORAL -PERONEAL PATCH ANGIOPLASTY WITH XENOSURE BIOLOGIC PATCH  ;  Surgeon: Marty Heck, MD;  Location: [place];  Service: Vascular;  Laterality: Left;  . PATCH ANGIOPLASTY Right 04/24/2020   Procedure: Patch Angioplasty of Right Femoral Artery using Long Xenosure Biologic Patch 1cm x 14 cm;  Surgeon: Marty Heck, MD;  Location: [facility] OR;  Service: Vascular;  Laterality: Right;  . PERIPHERAL VASCULAR CATHETERIZATION N/A 07/30/2015   Procedure: Abdominal Aortogram;  Surgeon: Conrad [place], MD;  Location: [place] [facility];  Service: Cardiovascular;  Laterality: N/A;  . PERIPHERAL VASCULAR CATHETERIZATION N/A 10/24/2016   Procedure: Abdominal Aortogram;   Surgeon: Angelia Mould, MD;  Location: [place] [facility];  Service: Cardiovascular;  Laterality: N/A;  . PERIPHERAL VASCULAR CATHETERIZATION N/A 10/24/2016   Procedure: Lower Extremity Angiography;  Surgeon: Angelia Mould, MD;  Location: [place] [facility];  Service: Cardiovascular;  Laterality: N/A;  . PERIPHERAL VASCULAR INTERVENTION Right 12/11/2017   Procedure: PERIPHERAL VASCULAR INTERVENTION;  Surgeon: Angelia Mould, MD;  Location: [place] [facility];  Service: Cardiovascular;  Laterality: Right;  Marland Kitchen VASCULAR SURGERY  ~ 2007   Stent SFA   . VEIN HARVEST Left 01/06/2016   Procedure: LEFT GREATER SAPPHENOUS VEIN HARVEST;  Surgeon: Mal Misty, MD;  Location: [facility] OR;  Service: Vascular;  Laterality: Left;    Social History: Social History   Tobacco Use  . Smoking status: Current Some Day Smoker    Years: 24.00    Types: Cigars  . Smokeless tobacco: Never Used  Vaping Use  . Vaping Use: Never used  Substance  Use Topics  . Alcohol use: Yes    Alcohol/week: 2.0 standard drinks    Types: 2 Cans of beer per week  . Drug use: No   Additional social history: Daily Cigar Smoker, Drinks 2 cans/week. Please also refer to relevant sections of EMR.  Family History: Family History  Problem Relation Age of Onset  . Cancer Mother        colon Cancer  . Hyperlipidemia Mother   . Diabetes Mother   . Heart disease Father   . Hypertension Father   . Cancer Sister        Uterine  . Diabetes Sister   . Diabetes Brother     Allergies and Medications: Allergies  Allergen Reactions  . Glipizide Other (See Comments)    REACTION IS SIDE EFFECT Severe hypoglycemia to 40s.    No current facility-administered medications on file prior to encounter.   Current Outpatient Medications on File Prior to Encounter  Medication Sig Dispense Refill  . aspirin EC 81 MG EC tablet Take 1 tablet (81 mg total) by mouth daily.    Marland Kitchen atorvastatin (LIPITOR) 40 MG tablet TAKE 1  TABLET BY MOUTH ONCE DAILY 90 tablet 2  . benazepril (LOTENSIN) 5 MG tablet Take 1 tablet (5 mg total) by mouth daily. 90 tablet 0  . cilostazol (PLETAL) 50 MG tablet Take 1 tablet (50 mg total) by mouth 2 (two) times daily. 90 tablet 1  . clopidogrel (PLAVIX) 75 MG tablet TAKE 1 TABLET BY MOUTH EVERY DAY 90 tablet 1  . [employer] ACETAMINOPHEN EX ST 500 MG tablet TAKE 2 TABLETS (1,000 MG TOTAL) BY MOUTH EVERY 8 (EIGHT) HOURS AS NEEDED FOR MODERATE PAIN. (Patient taking differently: Take 1,000 mg by mouth every 8 (eight) hours as needed for moderate pain. ) 90 tablet 0  . diclofenac Sodium (VOLTAREN) 1 % GEL Apply 2 g topically 4 (four) times daily. 50 g 1  . ezetimibe (ZETIA) 10 MG tablet TAKE 1 TABLET BY MOUTH ONCE DAILY 90 tablet 1  . FLUoxetine (PROZAC) 10 MG capsule Take 1 capsule (10 mg total) by mouth daily. 90 capsule 1  . gabapentin (NEURONTIN) 300 MG capsule TAKE ONE (1) CAPSULE BY MOUTH EVERY NIGHT AT BEDTIME AS NEEDED 90 capsule 1  . meloxicam (MOBIC) 7.5 MG tablet Take 1 tablet (7.5 mg total) by mouth daily as needed for up to 30 doses for pain. 30 tablet 0  . metFORMIN (GLUCOPHAGE) 1000 MG tablet TAKE ONE (1) TABLET BY MOUTH TWICE DAILY WITH MEALS 180 tablet 1  . naproxen (NAPROSYN) 500 MG tablet TAKE 1 PILL TWICE A DAY FOR 5 DAY. THEN TAKE AS NEEDED. 30 tablet 0  . Nebivolol HCl (BYSTOLIC) 20 MG TABS Take 1 tablet (20 mg total) by mouth daily. 90 tablet 0  . oxyCODONE-acetaminophen (PERCOCET/ROXICET) 5-325 MG tablet Take 1 tablet by mouth every 6 (six) hours as needed for moderate pain. (Patient not taking: Reported on 09/22/2020) 10 tablet 0  . pantoprazole (PROTONIX) 40 MG tablet TAKE 1 TABLET BY MOUTH EVERY DAY BEFORE BREAKFAST 60 tablet 1  . ranitidine (ZANTAC 75) 75 MG tablet Take 1 tablet (75 mg total) by mouth 2 (two) times daily as needed for heartburn (heartburn, acid reflux). 30 tablet 0  . traMADol (ULTRAM) 50 MG tablet Take 1 tablet (50 mg total) by mouth every 6 (six) hours as  needed (dressing changes). (Patient not taking: Reported on 09/22/2020) 25 tablet 0    Objective: BP (!) 139/95  Pulse 71   Temp 98 F (36.7 C) (Oral)   Resp 16   SpO2 100%  Exam:  General: Not in acute distress.  Lying in bed comfortably.  Oriented x4 Eyes: EOMI, PERRLA Neck: trachea central, no lymphadenopathy or thyromegaly  Cardiovascular: RRR, S1, S2 normal, no murmurs, rubs, gallops Respiratory: Clear to auscultation bilaterally.  No wheezes, rales, rhonchi Gastrointestinal: Soft, nondistended, nontender MSK: No edema present. Hyperpigmented skin changes left LE > right LE. Varicose veins present bilaterally. No palpable peripheral pulses, however feet are warm and well perfused.  Derm: Warm, dry skin in LE Neuro: No focal deficits Psych: Mood and affect appropriate.  Labs and Imaging: CBC BMET  Recent Labs  Lab 12/04/20 0930  WBC 4.8  HGB 5.6*  HCT 24.0*  PLT 286   Recent Labs  Lab 12/04/20 0930  NA 136  K 3.7  CL 102  CO2 21*  BUN 8  CREATININE 0.85  GLUCOSE 104*  CALCIUM 9.2     EKG: Not Performed yet.    Armando Reichert, MD 12/04/2020, 11:58 AM PGY-1, [place] Intern pager: [phone], text pages welcome  FPTS Upper-Level Resident Addendum   I have independently interviewed and examined the patient. I have discussed the above with the original author and agree with their documentation. My edits for correction/addition/clarification are in blue. Please see also any attending notes.   Lattie Haw MD PGY-2, [place] [facility] 12/04/2020 3:27 PM  [place] Service pager: [phone] (text pages welcome through [facility])

## 2020-12-04 NOTE — ED Notes (Signed)
Dinner Tray Ordered @ 1744. 

## 2020-12-04 NOTE — Progress Notes (Signed)
OT Cancellation Note  Patient Details Name: [name] MRN: [id number] DOB: 14-Oct-1951   Cancelled Treatment:    Reason Eval/Treat Not Completed: Patient not medically ready (Hgb currently 5.6. Will hold until this lab value is within therapeutic standards.)  Zenovia Jarred, MSOT, OTR/L [place] Office Number: [phone] Pager: [phone]  Zenovia Jarred 12/04/2020, 3:32 PM

## 2020-12-05 ENCOUNTER — Encounter (HOSPITAL_COMMUNITY)
Admission: EM | Disposition: A | Discharge status: Home / Self Care | Payer: Self-pay | Source: Home / Self Care | Attending: Family Medicine

## 2020-12-05 ENCOUNTER — Encounter (HOSPITAL_COMMUNITY): Payer: Self-pay | Admitting: Family Medicine

## 2020-12-05 DIAGNOSIS — D509 Iron deficiency anemia, unspecified: Principal | ICD-10-CM

## 2020-12-05 HISTORY — PX: COLONOSCOPY WITH PROPOFOL: SHX5780

## 2020-12-05 HISTORY — PX: ESOPHAGOGASTRODUODENOSCOPY (EGD) WITH PROPOFOL: SHX5813

## 2020-12-05 HISTORY — PX: BIOPSY: SHX5522

## 2020-12-05 LAB — RETICULOCYTES: RBC.: 4.45 MIL/uL (ref 4.22–5.81)

## 2020-12-05 LAB — TYPE AND SCREEN
ABO/RH(D): A POS
Antibody Screen: NEGATIVE
Unit division: 0
Unit division: 0

## 2020-12-05 LAB — BPAM RBC
Blood Product Expiration Date: 202201092359
Blood Product Expiration Date: 202201092359
ISSUE DATE / TIME: 202112101157
ISSUE DATE / TIME: 202112101458
Unit Type and Rh: 6200
Unit Type and Rh: 6200

## 2020-12-05 LAB — CBC
HCT: 33 % — ABNORMAL LOW (ref 39.0–52.0)
Hemoglobin: 8.5 g/dL — ABNORMAL LOW (ref 13.0–17.0)
MCH: 16 pg — ABNORMAL LOW (ref 26.0–34.0)
MCHC: 25.8 g/dL — ABNORMAL LOW (ref 30.0–36.0)
MCV: 62.3 fL — ABNORMAL LOW (ref 80.0–100.0)
Platelets: 272 10*3/uL (ref 150–400)
RBC: 5.3 MIL/uL (ref 4.22–5.81)
RDW: 29.5 % — ABNORMAL HIGH (ref 11.5–15.5)
WBC: 4.4 10*3/uL (ref 4.0–10.5)
nRBC: 0 % (ref 0.0–0.2)

## 2020-12-05 LAB — BASIC METABOLIC PANEL
Anion gap: 10 (ref 5–15)
BUN: 5 mg/dL — ABNORMAL LOW (ref 8–23)
CO2: 22 mmol/L (ref 22–32)
Calcium: 9.2 mg/dL (ref 8.9–10.3)
Chloride: 103 mmol/L (ref 98–111)
Creatinine, Ser: 0.69 mg/dL (ref 0.61–1.24)
GFR, Estimated: >60 mL/min (ref >60)
Glucose, Bld: 105 mg/dL — ABNORMAL HIGH (ref 70–99)
Potassium: 3.8 mmol/L (ref 3.5–5.1)
Sodium: 135 mmol/L (ref 135–145)

## 2020-12-05 LAB — GLUCOSE, CAPILLARY: Glucose-Capillary: 103 mg/dL — ABNORMAL HIGH (ref 70–99)

## 2020-12-05 LAB — FOLATE: Folate: 13.5 ng/mL (ref >5.9)

## 2020-12-05 LAB — PROTIME-INR
INR: 1.1 (ref 0.8–1.2)
Prothrombin Time: 14 seconds (ref 11.4–15.2)

## 2020-12-05 SURGERY — COLONOSCOPY WITH PROPOFOL
Anesthesia: Monitor Anesthesia Care

## 2020-12-05 MED ORDER — PANTOPRAZOLE SODIUM 40 MG PO TBEC
40.0000 mg | DELAYED_RELEASE_TABLET | Freq: Every day | ORAL | Status: DC
  Administered 2020-12-05: 40 mg via ORAL
  Filled 2020-12-05: qty 1

## 2020-12-05 MED ORDER — MIDAZOLAM HCL (PF) 5 MG/ML IJ SOLN
INTRAMUSCULAR | Status: AC
  Filled 2020-12-05: qty 3

## 2020-12-05 MED ORDER — MIDAZOLAM HCL (PF) 5 MG/ML IJ SOLN
INTRAMUSCULAR | Status: DC | PRN
  Administered 2020-12-05: 2 mg via INTRAVENOUS
  Administered 2020-12-05: 1 mg via INTRAVENOUS
  Administered 2020-12-05 (×2): 2 mg via INTRAVENOUS
  Administered 2020-12-05: 1 mg via INTRAVENOUS

## 2020-12-05 MED ORDER — FENTANYL CITRATE (PF) 100 MCG/2ML IJ SOLN
INTRAMUSCULAR | Status: DC | PRN
  Administered 2020-12-05 (×4): 25 ug via INTRAVENOUS

## 2020-12-05 MED ORDER — SODIUM CHLORIDE 0.9 % IV SOLN
510.0000 mg | Freq: Once | INTRAVENOUS | Status: AC
  Administered 2020-12-05: 510 mg via INTRAVENOUS
  Filled 2020-12-05: qty 17

## 2020-12-05 MED ORDER — DIPHENHYDRAMINE HCL 50 MG/ML IJ SOLN
INTRAMUSCULAR | Status: AC
  Filled 2020-12-05: qty 1

## 2020-12-05 MED ORDER — SODIUM CHLORIDE 0.9 % IV SOLN: INTRAVENOUS | Status: DC

## 2020-12-05 MED ORDER — PEG 3350-KCL-NA BICARB-NACL 420 G PO SOLR
4000.0000 mL | Freq: Once | ORAL | Status: AC
  Administered 2020-12-05: 4000 mL via ORAL
  Filled 2020-12-05 (×2): qty 4000

## 2020-12-05 MED ORDER — FENTANYL CITRATE (PF) 100 MCG/2ML IJ SOLN
INTRAMUSCULAR | Status: AC
  Filled 2020-12-05: qty 4

## 2020-12-05 SURGICAL SUPPLY — 25 items

## 2020-12-05 NOTE — Discharge Instructions (Signed)
You were hospitalized at [facility] due to low blood counts. While you were in the hospital you had an upper and lower endoscopy that did not show any signs of active bleeding. This is good news. We suspect this is from iron deficiency. You received two blood transfusions which helped to bring your blood level back up. You also received an iron infusion to help restore your iron stores. We are so glad you are feeling better. Please do not take your Aspirin, Plavix, Mobic or Pletal until told otherwise by your doctor. Be sure to follow-up with your regularly scheduled appointments.  Please also be sure to follow-up with Dr. Ouida Sills at the [facility] on 12/13 at 3:50PM.  Thank you for allowing [place] to take care of you.  Take care, [facility] family medicine team    Iron Deficiency Anemia, Adult Iron-deficiency anemia is when you have a low amount of red blood cells or hemoglobin. This happens because you have too little iron in your body. Hemoglobin carries oxygen to parts of the body. Anemia can cause your body to not get enough oxygen. It may or may not cause symptoms. Follow these instructions at home: Medicines  Take over-the-counter and prescription medicines only as told by your doctor. This includes iron pills (supplements) and vitamins.  If you cannot handle taking iron pills by mouth, ask your doctor about getting iron through: ? A vein (intravenously). ? A shot (injection) into a muscle.  Take iron pills when your stomach is empty. If you cannot handle this, take them with food.  Do not drink milk or take antacids at the same time as your iron pills.  To prevent trouble pooping (constipation), eat fiber or take medicine (stool softener) as told by your doctor. Eating and drinking   Talk with your doctor before changing the foods you eat. He or she may tell you to eat foods that have a lot of iron, such as: ? Liver. ? Lowfat (lean) beef. ? Breads and cereals that  have iron added to them (fortified breads and cereals). ? Eggs. ? Dried fruit. ? Dark green, leafy vegetables.  Drink enough fluid to keep your pee (urine) clear or pale yellow.  Eat fresh fruits and vegetables that are high in vitamin C. They help your body to use iron. Foods with a lot of vitamin C include: ? Oranges. ? Peppers. ? Tomatoes. ? Mangoes. General instructions  Return to your normal activities as told by your doctor. Ask your doctor what activities are safe for you.  Keep yourself clean, and keep things clean around you (your surroundings). Anemia can make you get sick more easily.  Keep all follow-up visits as told by your doctor. This is important. Contact a doctor if:  You feel sick to your stomach (nauseous).  You throw up (vomit).  You feel weak.  You are sweating for no clear reason.  You have trouble pooping, such as: ? Pooping (having a bowel movement) less than 3 times a week. ? Straining to poop. ? Having poop that is hard, dry, or larger than normal. ? Feeling full or bloated. ? Pain in the lower belly. ? Not feeling better after pooping. Get help right away if:  You pass out (faint). If this happens, do not drive yourself to the hospital. Call your local emergency services (911 in the [place]  You have chest pain.  You have shortness of breath that: ? Is very bad. ? Gets worse with physical  activity.  You have a fast heartbeat.  You get light-headed when getting up from sitting or lying down. This information is not intended to replace advice given to you by your health care provider. Make sure you discuss any questions you have with your health care provider. Document Revised: 11/24/2017 Document Reviewed: 08/31/2016 Elsevier Patient Education  [place].

## 2020-12-05 NOTE — ED Notes (Addendum)
Patient finished 2/3 of the polyethylene and refused to drink anymore.

## 2020-12-05 NOTE — Interval H&P Note (Signed)
History and Physical Interval Note:  12/05/2020 9:52 AM  [name]  has presented today for surgery, with the diagnosis of IDA.  The various methods of treatment have been discussed with the patient and family. After consideration of risks, benefits and other options for treatment, the patient has consented to  Procedure(s): COLONOSCOPY WITH PROPOFOL (N/A) ESOPHAGOGASTRODUODENOSCOPY (EGD) WITH PROPOFOL (N/A) as a surgical intervention.  The patient's history has been reviewed, patient examined, no change in status, stable for surgery.  I have reviewed the patient's chart and labs.  Questions were answered to the patient's satisfaction.     Pricilla Riffle. Fuller Plan

## 2020-12-05 NOTE — Discharge Summary (Addendum)
[facility] Discharge Summary  Patient name: [name] Medical record number: [id number] Date of birth: [birth year] Age: 69 y.o. Gender: male Date of Admission: 12/04/2020  Date of Discharge: 12/05/20 Admitting Physician: Blane Ohara McDiarmid, MD  Primary Care Provider: Richarda Osmond, DO Consultants: GI, Vascular Surgery  Indication for Hospitalization: Acute Symptomatic Anemia   Discharge Diagnoses/Problem List:  Acute Symptomatic Iron-Deficiency Anemia PAD Chronic leg pain  T2DM Hypertension GERD HLD Depression Tobacco use  Disposition: Home  Discharge Condition: Stable   Discharge Exam:  Temp:  [97.4 F (36.3 C)-98.6 F (37 C)] 98.3 F (36.8 C) (12/11 0647) Pulse Rate:  [57-86] 67 (12/11 0647) Resp:  [13-26] 18 (12/11 0647) BP: (92-176)/(41-99) 172/66 (12/11 0647) SpO2:  [96 %-100 %] 100 % (12/11 0647) Weight:  [75.5 kg] 75.5 kg (12/11 0653) Physical Exam: General: NAD, laying in bed, pleasant Cardiovascular: Normal S1, S2, regular rate Respiratory: CTAB without wheezing, rhonchi or rales Abdomen: Non-tender in all quadrants, non-distended, hyperactive bowel sounds, no R/G Extremities: 2+ femoral and radial pulses, unable to palpate DP or PT pulses, extremities only slightly cold, without tenderness and dry   Brief Hospital Course:  [name] is a 69 y.o. male who presented with symptomatic iron-deficiency anemia, Hgb 6 in clinic, and bilateral leg pain. PMH is significant for peripheral arterial disease, type 2 diabetes mellitus, depression, chronic low back pain, diabetic neuropathy, iron deficiency anemia, obesity, tobacco use, hypertension, GERD, HLD, osteoarthritis.  Iron deficiency anemia  Patient reported to ED referred from PCP for a hemoglobin of 6.1 with MCV of 57. GI consulted and did an upper and lower endoscopy that were without any significant findings. Received 2 units PRBCs with improvement of Hgb to 8.5 on  discharge.  Patient received IV Feraheme x1 prior to discharge.   Chronic Leg Pain  Peripheral artery occlusive disease Patient without palpable peripheral pulses on examination. Additionally, unable to detect blood flow on bedside dopplers in ED. Vascular surgery consult inpatient. Recommended arteriogram though patient would require heparin therapy for this. In the setting of acute anemia, workup was deferred for outpatient. Patient was advised to discontinue his Pletal, ASA, Plavix and Mobic on discharge.   All other chronic conditions and stable.     Issues for Follow Up:  1. Repeat CBC (s/p 2U PRBC's 12/10) D/c hgb 8.5. 2. Received 1 dose IV feraheme on 12/11, consider another dose in 3-5 days 3. Advised to stop ASA, Plavix, Mobic, Pletal  4. Had EGD/colonoscopy without source of bleed. Will need outpatient follow up with Vascular for arteriogram and will need to be on antiplatelet therapy in the future.   5. Encourage smoking cessation 6. Continue outpatient work up for anemia if patient is not able to keep stable Hgb.  Significant Procedures:  Upper Endoscopy 12/11 -Normal esophagus. - Focal area of mildly nodular mucosa with focal pale mucosa in the greater curvature- biopsied. - A tattoo was seen in the duodenum. The tattoo site appeared normal.  Colonoscopy 12/11 - Internal hemorrhoids and hypertrophied anal papillae. - The examination was otherwise normal on direct and retroflexion views  Significant Labs and Imaging:  Recent Labs  Lab 11/30/20 0944 12/04/20 0930 12/04/20 2300 12/05/20 0315  WBC 4.9 4.8  --  4.4  HGB 6.1* 5.6* 7.5* 8.5*  HCT 26.4* 24.0* 27.0* 33.0*  PLT 288 286  --  272   Recent Labs  Lab 11/30/20 0944 12/04/20 0930 12/05/20 0315  NA 139 136 135  K  4.6 3.7 3.8  CL 102 102 103  CO2 21 21* 22  GLUCOSE 91 104* 105*  BUN 10 8 5*  CREATININE 0.78 0.85 0.69  CALCIUM 8.9 9.2 9.2  ALKPHOS  --  50  --   AST  --  19  --   ALT  --  10  --    ALBUMIN  --  4.0  --     Results/Tests Pending at Time of Discharge: Surgical pathology from EGD.  Discharge Medications:  Allergies as of 12/05/2020      Reactions   Glipizide Other (See Comments)   REACTION IS SIDE EFFECT Severe hypoglycemia to 40s.       Medication List    STOP taking these medications   aspirin 81 MG EC tablet   cilostazol 50 MG tablet Commonly known as: PLETAL   clopidogrel 75 MG tablet Commonly known as: PLAVIX   meloxicam 7.5 MG tablet Commonly known as: Mobic     TAKE these medications   acetaminophen 325 MG tablet Commonly known as: TYLENOL Take 650 mg by mouth daily.   atorvastatin 40 MG tablet Commonly known as: LIPITOR TAKE 1 TABLET BY MOUTH ONCE DAILY What changed: when to take this   benazepril 5 MG tablet Commonly known as: LOTENSIN Take 1 tablet (5 mg total) by mouth daily.   diclofenac Sodium 1 % Gel Commonly known as: Voltaren Apply 2 g topically 4 (four) times daily.   ezetimibe 10 MG tablet Commonly known as: ZETIA TAKE 1 TABLET BY MOUTH ONCE DAILY   FLUoxetine 10 MG capsule Commonly known as: PROZAC Take 1 capsule (10 mg total) by mouth daily.   gabapentin 300 MG capsule Commonly known as: NEURONTIN TAKE ONE (1) CAPSULE BY MOUTH EVERY NIGHT AT BEDTIME AS NEEDED What changed: See the new instructions.   metFORMIN 1000 MG tablet Commonly known as: GLUCOPHAGE TAKE ONE (1) TABLET BY MOUTH TWICE DAILY WITH MEALS What changed: See the new instructions.   Nebivolol HCl 20 MG Tabs Commonly known as: Bystolic Take 1 tablet (20 mg total) by mouth daily.   pantoprazole 40 MG tablet Commonly known as: PROTONIX TAKE 1 TABLET BY MOUTH EVERY DAY BEFORE BREAKFAST What changed: See the new instructions.       Discharge Instructions: Please refer to Patient Instructions section of EMR for full details.  Patient was counseled important signs and symptoms that should prompt return to medical care, changes in medications,  dietary instructions, activity restrictions, and follow up appointments.   Follow-Up Appointments:  Follow-up Information    Ouida Sills, Chelsey L, DO. Go on 12/07/2020.   Specialty: Family Medicine Why: You have an appointment on 3:50PM. Please arrive by 3:30PM. Contact information: [address] [place] [address] [place], [place], DO 12/05/2020, 6:31 PM PGY-1, [facility] Upper-Level Resident Addendum   I have independently interviewed and examined the patient. I have discussed the above with the original author and agree with their documentation. My edits for correction/addition/clarification have been made.  [place] Constable, D.O. PGY-3, [facility] 12/05/2020 6:50 PM

## 2020-12-05 NOTE — Op Note (Addendum)
[facility] Patient Name: [name] Procedure Date : 12/05/2020 MRN: [id number] Attending MD: Ladene Artist , MD Date of Birth: 1951/06/06 CSN: [id number] Age: 69 Admit Type: Inpatient Procedure:                Upper GI endoscopy Indications:              Unexplained iron deficiency anemia Providers:                Pricilla Riffle. Fuller Plan, MD, Kary Kos RN, RN, Laverda Sorenson, Technician, Benetta Spar, Technician Referring MD:             Blane Ohara McDiarmid, MD Medicines:                Midazolam 2 mg IV, Fentanyl 25 micrograms IV Complications:            No immediate complications. Estimated Blood Loss:     Estimated blood loss was minimal. Procedure:                Pre-Anesthesia Assessment:                           - Prior to the procedure, a History and Physical                            was performed, and patient medications and                            allergies were reviewed. The patient's tolerance of                            previous anesthesia was also reviewed. The risks                            and benefits of the procedure and the sedation                            options and risks were discussed with the patient.                            All questions were answered, and informed consent                            was obtained. Prior Anticoagulants: The patient has                            taken no previous anticoagulant or antiplatelet                            agents. ASA Grade Assessment: II - A patient with                            mild systemic disease. After reviewing the risks  and benefits, the patient was deemed in                            satisfactory condition to undergo the procedure.                           After obtaining informed consent, the endoscope was                            passed under direct vision. Throughout the                            procedure, the  patient's blood pressure, pulse, and                            oxygen saturations were monitored continuously. The                            GIF-H190 ([id number] Olympus gastroscope was                            introduced through the mouth, and advanced to the                            second part of duodenum. The upper GI endoscopy was                            accomplished without difficulty. The patient                            tolerated the procedure well. Scope In: Scope Out: Findings:      The examined esophagus was normal.      Localized focal mild mucosal changes characterized by mild nodularity       and an area of pale mucosa were found on the greater curvature of the       stomach. Biopsies were taken with a cold forceps for histology.      The exam of the stomach was otherwise normal.      A tattoo was seen in the second portion of the duodenum. The tattoo site       appeared normal.      The exam of the duodenum was otherwise normal. Biopsies were taken with       a cold forceps for histology - R/O celiac disease. Impression:               - Normal esophagus.                           - Focal area of mildly nodular mucosa with focal                            pale mucosa in the greater curvature. Biopsied.                           - A tattoo was seen in the duodenum. The tattoo  site appeared normal. Moderate Sedation:      Moderate (conscious) sedation was administered by the endoscopy nurse       and supervised by the endoscopist. The following parameters were       monitored: oxygen saturation, heart rate, blood pressure, respiratory       rate, EKG, adequacy of pulmonary ventilation, and response to care.       Total physician intraservice time was 12 minutes. Recommendation:           - Return patient to hospital ward for ongoing care.                           - Resume previous diet.                           - Continue present  medications.                           - Recommend IV iron infusion prior to discharge,                            per primary service.                           - Await pathology results.                           - Consider VCE as outpatient, per Dr Collene Mares.                           - OK for discharge later today from [place] standpoint.                           - Outpatient follow up with Dr. Collene Mares. Procedure Code(s):        --- Professional ---                           [phone], Esophagogastroduodenoscopy, flexible,                            transoral; with biopsy, single or multiple                           G0500, Moderate sedation services provided by the                            same physician or other qualified health care                            professional performing a gastrointestinal                            endoscopic service that sedation supports,                            requiring the presence of an independent trained  observer to assist in the monitoring of the                            patient's level of consciousness and physiological                            status; initial 15 minutes of intra-service time;                            patient age 39 years or older (additional time may                            be reported with [phone], as appropriate) Diagnosis Code(s):        --- Professional ---                           K31.89, Other diseases of stomach and duodenum                           D50.9, Iron deficiency anemia, unspecified CPT copyright 2019 American Medical Association. All rights reserved. The codes documented in this report are preliminary and upon coder review may  be revised to meet current compliance requirements. Ladene Artist, MD 12/05/2020 11:04:50 AM This report has been signed electronically. Number of Addenda: 0

## 2020-12-05 NOTE — Plan of Care (Signed)

## 2020-12-05 NOTE — Hospital Course (Addendum)
[name] is a 69 y.o. male who presented with symptomatic iron-deficiency anemia, Hgb 6 in clinic, and bilateral leg pain. PMH is significant for peripheral arterial disease, type 2 diabetes mellitus, depression, chronic low back pain, diabetic neuropathy, iron deficiency anemia, obesity, tobacco use, hypertension, GERD, HLD, osteoarthritis.  Iron deficiency anemia  Patient reported to ED referred from PCP for a hemoglobin of 6.1 with MCV of 57. GI consulted and did an upper and lower endoscopy that were without any significant findings. Received 2 units PRBCs with improvement of Hgb to 8.5 on discharge.  Patient received IV Feraheme x1 prior to discharge.   Chronic Leg Pain  Peripheral artery occlusive disease Patient without palpable peripheral pulses on examination. Additionally, unable to detect blood flow on bedside dopplers in ED. Vascular surgery consult inpatient. Recommended arteriogram though patient would require heparin therapy for this. In the setting of acute anemia, workup was deferred for outpatient. Patient was advised to discontinue his Pletal, ASA, Plavix and Mobic on discharge.   All other chronic conditions and stable.

## 2020-12-05 NOTE — Evaluation (Signed)
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    Clinical Impression  Pt admitted with above. Pt functioning near baseline. Pt c/o bilat thigh pain and back pain. Pt unable to stand upright due to increased bilat LE pain. Pt uses straight cane for long distance ambulation. Acute PT to monitor patient and progress to indep.    Follow Up Recommendations No PT follow up    Equipment Recommendations  None recommended by PT    Recommendations for Other Services       Precautions / Restrictions Precautions Precautions: Fall Restrictions Weight Bearing Restrictions: No      Mobility  Bed Mobility Overal bed mobility: Independent             General bed mobility comments: no difficulty    Transfers Overall transfer level: Modified independent Equipment used: None             General transfer comment: increased time due to chronic back pain but no physical assist required  Ambulation/Gait Ambulation/Gait assistance: Supervision Gait Distance (Feet): 100 Feet Assistive device: Straight cane Gait Pattern/deviations: Step-through pattern Gait velocity: dec   General Gait Details: pt with increased trunk flexion due to increased bilat thigh pain with trunk ext  Stairs            Wheelchair Mobility    Modified Rankin (Stroke Patients Only)       Balance Overall balance assessment: Mild deficits observed, not formally tested                                           Pertinent Vitals/Pain Pain Assessment: 0-10 Pain Score: 6  Pain Location: bilat thighs Pain Descriptors / Indicators: Aching Pain Intervention(s): Monitored during session    Home Living Family/patient  expects to be discharged to:: Private residence Living Arrangements: Alone Available Help at Discharge: Family;Available 24 hours/day (can go to ex-wife's house) Type of Home: House Home Access: Level entry     Home Layout: One level Home Equipment: Cane - single point;Bedside commode;Hand held shower head;Walker - 2 wheels      Prior Function Level of Independence: Independent with assistive device(s)         Comments: uses cane for long distance ambulation, drives, does grocery shopping     Hand Dominance   Dominant Hand: Right    Extremity/Trunk Assessment   Upper Extremity Assessment Upper Extremity Assessment: Overall WFL for tasks assessed    Lower Extremity Assessment Lower Extremity Assessment: Generalized weakness (h/o weakness in LEs and pain in bilat thighs)    Cervical / Trunk Assessment Cervical / Trunk Assessment: Normal  Communication   Communication: No difficulties  Cognition Arousal/Alertness: Awake/alert Behavior During Therapy: WFL for tasks assessed/performed Overall Cognitive Status: Within Functional Limits for tasks assessed                                        General Comments General comments (skin integrity, edema, etc.): vss    Exercises     Assessment/Plan    PT  Assessment Patient needs continued PT services  PT Problem List Decreased strength;Decreased activity tolerance;Decreased balance;Decreased mobility       PT Treatment Interventions DME instruction;Gait training;Stair training;Functional mobility training;Therapeutic activities;Balance training;Therapeutic exercise    PT Goals (Current goals can be found in the Care Plan section)  Acute Rehab PT Goals Patient Stated Goal: home today PT Goal Formulation: With patient Time For Goal Achievement: 12/19/20 Potential to Achieve Goals: Good Additional Goals Additional Goal #1: Pt to score >19 on DGI to indicate minimal falls risk.    Frequency Min  2X/week   Barriers to discharge        Co-evaluation               AM-PAC PT "6 Clicks" Mobility  Outcome Measure Help needed turning from your back to your side while in a flat bed without using bedrails?: None Help needed moving from lying on your back to sitting on the side of a flat bed without using bedrails?: None Help needed moving to and from a bed to a chair (including a wheelchair)?: None Help needed standing up from a chair using your arms (e.g., wheelchair or bedside chair)?: None Help needed to walk in hospital room?: None Help needed climbing 3-5 steps with a railing? : A Little 6 Click Score: 23    End of Session Equipment Utilized During Treatment: Gait belt Activity Tolerance: Patient tolerated treatment well Patient left: in bed;with call bell/phone within reach (sitting EOB) Nurse Communication: Mobility status PT Visit Diagnosis: Unsteadiness on feet (R26.81);Difficulty in walking, not elsewhere classified (R26.2)    Time: 1655-3748 PT Time Calculation (min) (ACUTE ONLY): 17 min   Charges:   PT Evaluation $PT Eval Low Complexity: 1 Low          Kittie Plater, PT, DPT Acute Rehabilitation Services Pager #: [phone] Office #: [phone]   Berline Lopes 12/05/2020, 2:35 PM

## 2020-12-05 NOTE — Progress Notes (Signed)
OT Cancellation Note  Patient Details Name: KUNAAL WALKINS MRN: [id number] DOB: 1951-01-10   Cancelled Treatment:    Reason Eval/Treat Not Completed: Patient at procedure or test/ unavailable (Pt OTF at endo. Will follow up as available and appropriate)  Zenovia Jarred, MSOT, OTR/L [facility] Office Number: [phone] Pager: [phone]  Zenovia Jarred 12/05/2020, 11:07 AM

## 2020-12-05 NOTE — Progress Notes (Signed)
DISCHARGE NOTE HOME [name] to be discharged Home per MD order. Discussed prescriptions and follow up appointments with the patient. Prescriptions given to patient; medication list explained in detail. Patient verbalized understanding.  Skin clean, dry and intact without evidence of skin break down, no evidence of skin tears noted. IV catheter discontinued intact. Site without signs and symptoms of complications. Dressing and pressure applied. Pt denies pain at the site currently. No complaints noted.  Patient free of lines, drains, and wounds.   An After Visit Summary (AVS) was printed and given to the patient. Patient escorted via wheelchair, and discharged home via private auto.  Paulla Fore, RN, BSN

## 2020-12-05 NOTE — Progress Notes (Signed)
Spoke with Dr. Stanford Breed to discuss possible discharge.  He reports that patient can f/u with his clinic as outpatient and he will message his clinic to facilitate.  He notes that since colonoscopy and endoscopy were negative, will need further workup as outpatient to find source so they will be able to perform angiogram and have patient on antiplatelet therapy in the future.    [place] Constable, D.O.  PGY-3 Family Medicine  12/05/2020 4:33 PM

## 2020-12-05 NOTE — Progress Notes (Signed)
Received report from ED RN,Gabby. RN made aware that there's no bed yet in the room, will call her when room is ready. Bed placement made aware that there's no bed in the room and that we had called for a bed more than an hour ago.

## 2020-12-05 NOTE — Progress Notes (Signed)
New Admission Note:   Arrival Method: Arrived from [facility] ED via bed Mental Orientation: Alert and oriented x4 Telemetry: Box #8 Assessment: Completed Skin: Intact IV: NSL-Rt FA,Rt Wrist Pain: Denies Tubes: N/A Safety Measures: Safety Fall Prevention Plan has been discussed.  Admission: Completed 5MW Orientation: Patient has been oriented to the room, unit and staff.  Family: None at bedside  Orders have been reviewed and implemented. Will continue to monitor the patient. Call light has been placed within reach and bed alarm has been activated.   Eulogia Dismore [employer], RN-BC Phone number: [phone]

## 2020-12-05 NOTE — Consult Note (Addendum)
Hospital Consult    Reason for Consult:  PAD Requesting Physician:  [facility] MRN #:  [id number]  History of Present Illness: This is a 69 y.o. male with past medical history significant for hypertension, hyperlipidemia, diabetes mellitus, tobacco abuse, and PAD.  He is well-known to the vascular surgery service with history of left lower extremity femoral to peroneal bypass requiring thrombectomy and 12/20.  He is also had a right external iliac artery stent in 2018.  Most recently he had right femoral endarterectomy and profundoplasty on 04/24/2020 due to rest pain.  He was last seen by Dr. Carlis Abbott in September of this year.  At that time Dr. Carlis Abbott offered the patient arteriogram due to ongoing pain in right thigh that radiates to the right foot.  He was admitted to the hospital severely anemic and will undergo endoscopy and colonoscopy today by GI.  He denies any rest pain in his right foot.  He states he does have constant pain in his right thigh however this has been unchanged since visit with Dr. Carlis Abbott.  He denies any wounds of bilateral lower extremities.  He states he was taking his aspirin, Plavix, and statin.  He is an everyday smoker.  Past Medical History:  Diagnosis Date  . Anemia   . Arthritis    OA  . Cervical radiculopathy    Dr. Vertell Limber neurosurgery  . Chronic lower back pain   . Claudication of both lower extremities ([place]) 07/15/2015  . Critical lower limb ischemia ([place]) 12/19/2019  . Diabetes mellitus    takes Metformin daily  . GERD (gastroesophageal reflux disease)    takes Protonix daily  . History of blood transfusion    "related to low HgB" ((09/10/2015  . Hyperlipidemia    takes Vytorin daily  . Hypertension    takes Benazepril and Bystolic daily  . PAD (peripheral artery disease) ([place])   . Pneumonia   . Radiculopathy, cervical region 02/11/2017  . Shortness of breath dyspnea    with exertion  . Tobacco user   . Toe fracture, right 05/09/2011  . Wears glasses      Past Surgical History:  Procedure Laterality Date  . ABDOMINAL AORTOGRAM W/LOWER EXTREMITY N/A 12/11/2017   Procedure: ABDOMINAL AORTOGRAM W/LOWER EXTREMITY;  Surgeon: Angelia Mould, MD;  Location: [place] [facility];  Service: Cardiovascular;  Laterality: N/A;  . ABDOMINAL AORTOGRAM W/LOWER EXTREMITY Bilateral 04/22/2020   Procedure: ABDOMINAL AORTOGRAM W/LOWER EXTREMITY;  Surgeon: Marty Heck, MD;  Location: [place] [facility];  Service: Cardiovascular;  Laterality: Bilateral;  . ANTERIOR CERVICAL DECOMP/DISCECTOMY FUSION  03/08/12   C6-7  . ANTERIOR CERVICAL DECOMP/DISCECTOMY FUSION  03/08/2012   Procedure: ANTERIOR CERVICAL DECOMPRESSION/DISCECTOMY FUSION 1 LEVEL/HARDWARE REMOVAL;  Surgeon: Erline Levine, MD;  Location: [place] [facility];  Service: Neurosurgery;  Laterality: N/A;  revison of C5-7 anterior cervical decompression with fusion with Cervical Five-Thoracic One anterior cervical decompression with fusion with interbody prothesis plating and bonegraft  . BACK SURGERY  1996  . BYPASS GRAFT FEMORAL-PERONEAL Left 11/11/2016   Procedure: REDO LEFT FEMORAL-PERONEAL BYPASS WITH PROPATEN 6MM X 80CM GRAFT;  Surgeon: Angelia Mould, MD;  Location: [facility] OR;  Service: Vascular;  Laterality: Left;  . COLONOSCOPY W/ BIOPSIES AND POLYPECTOMY  08/17/2012   f/u 5 years, 4 polyps, no high grade dysplasia or malignancy, tubular adenoma, hyperplastic polyops  . EMBOLECTOMY Left 12/19/2019   Procedure: LEFT FEMERAL- PERONEAL THROMBECTOMY;  Surgeon: Marty Heck, MD;  Location: [place];  Service: Vascular;  Laterality: Left;  . ENDARTERECTOMY FEMORAL Right 04/24/2020   Procedure: RIGHT FEMORAL ENDARTERECTOMY;  Surgeon: Marty Heck, MD;  Location: [place];  Service: Vascular;  Laterality: Right;  . ENTEROSCOPY N/A 12/11/2015   Procedure: ENTEROSCOPY;  Surgeon: Carol Ada, MD;  Location: [facility] ENDOSCOPY;  Service: Endoscopy;  Laterality: N/A;  . ENTEROSCOPY N/A 03/29/2018    Procedure: ENTEROSCOPY;  Surgeon: Carol Ada, MD;  Location: [facility] ENDOSCOPY;  Service: Endoscopy;  Laterality: N/A;  . ESOPHAGOGASTRODUODENOSCOPY  08/17/2012   normal esophagus and GEJ, diffuse gastritis with erythema- no malignancy, reactive gastropathy  with focal intestinal metaplasia  . FEMORAL-POPLITEAL BYPASS GRAFT Left 01/06/2016   Procedure: Left  COMMON FEMORAL-BELOW KNEE POPLITEAL ARTERY Bypass using non-reversed translocated saphenous vein graft from left leg;  Surgeon: Mal Misty, MD;  Location: [place];  Service: Vascular;  Laterality: Left;  . GIVENS CAPSULE STUDY N/A 11/24/2015   Procedure: GIVENS CAPSULE STUDY;  Surgeon: Juanita Craver, MD;  Location: [place];  Service: Endoscopy;  Laterality: N/A;  . HOT HEMOSTASIS N/A 03/29/2018   Procedure: HOT HEMOSTASIS (ARGON PLASMA COAGULATION/BICAP);  Surgeon: Carol Ada, MD;  Location: [place];  Service: Endoscopy;  Laterality: N/A;  . INGUINAL HERNIA REPAIR  1990's   right  . INTRAOPERATIVE ARTERIOGRAM Left 01/06/2016   Procedure: INTRA OPERATIVE ARTERIOGRAM LEFT LOWER LEG;  Surgeon: Mal Misty, MD;  Location: [place];  Service: Vascular;  Laterality: Left;  . INTRAOPERATIVE ARTERIOGRAM Left 11/11/2016   Procedure: INTRA OPERATIVE ARTERIOGRAM LEFT LOWER EXTRIMITY;  Surgeon: Angelia Mould, MD;  Location: [place];  Service: Vascular;  Laterality: Left;  . IR ANGIOGRAM FOLLOW UP STUDY  12/11/2017  . IR GENERIC HISTORICAL  10/24/2016   IR ANGIOGRAM FOLLOW UP STUDY  . LOWER EXTREMITY ANGIOGRAM Bilateral 07/30/2015   Procedure: Lower Extremity Angiogram;  Surgeon: Conrad Greenleaf, MD;  Location: [place] [facility];  Service: Cardiovascular;  Laterality: Bilateral;  . [place]   "lower"  . PATCH ANGIOPLASTY Left 12/19/2019   Procedure: LEFT FEMORAL -PERONEAL PATCH ANGIOPLASTY WITH XENOSURE BIOLOGIC PATCH  ;  Surgeon: Marty Heck, MD;  Location: [place];  Service: Vascular;  Laterality: Left;  . PATCH  ANGIOPLASTY Right 04/24/2020   Procedure: Patch Angioplasty of Right Femoral Artery using Long Xenosure Biologic Patch 1cm x 14 cm;  Surgeon: Marty Heck, MD;  Location: [facility] OR;  Service: Vascular;  Laterality: Right;  . PERIPHERAL VASCULAR CATHETERIZATION N/A 07/30/2015   Procedure: Abdominal Aortogram;  Surgeon: Conrad [place], MD;  Location: [place] [facility];  Service: Cardiovascular;  Laterality: N/A;  . PERIPHERAL VASCULAR CATHETERIZATION N/A 10/24/2016   Procedure: Abdominal Aortogram;  Surgeon: Angelia Mould, MD;  Location: [place] [facility];  Service: Cardiovascular;  Laterality: N/A;  . PERIPHERAL VASCULAR CATHETERIZATION N/A 10/24/2016   Procedure: Lower Extremity Angiography;  Surgeon: Angelia Mould, MD;  Location: [place] [facility];  Service: Cardiovascular;  Laterality: N/A;  . PERIPHERAL VASCULAR INTERVENTION Right 12/11/2017   Procedure: PERIPHERAL VASCULAR INTERVENTION;  Surgeon: Angelia Mould, MD;  Location: [place] [facility];  Service: Cardiovascular;  Laterality: Right;  Marland Kitchen VASCULAR SURGERY  ~ 2007   Stent SFA   . VEIN HARVEST Left 01/06/2016   Procedure: LEFT GREATER SAPPHENOUS VEIN HARVEST;  Surgeon: Mal Misty, MD;  Location: [place];  Service: Vascular;  Laterality: Left;    Allergies  Allergen Reactions  . Glipizide Other (See Comments)    REACTION IS SIDE EFFECT  Severe hypoglycemia to 40s.     Prior to Admission medications   Medication Sig Start Date End Date Taking? Authorizing Provider  acetaminophen (TYLENOL) 325 MG tablet Take 650 mg by mouth daily.   Yes [provider]  aspirin EC 81 MG EC tablet Take 1 tablet (81 mg total) by mouth daily. 12/21/19  Yes Marty Heck, MD  atorvastatin (LIPITOR) 40 MG tablet TAKE 1 TABLET BY MOUTH ONCE DAILY Patient taking differently: Take 40 mg by mouth at bedtime. 09/18/20  Yes Anderson, Chelsey L, DO  benazepril (LOTENSIN) 5 MG tablet Take 1 tablet (5 mg total) by mouth  daily. 11/17/20  Yes Anderson, Chelsey L, DO  cilostazol (PLETAL) 50 MG tablet Take 1 tablet (50 mg total) by mouth 2 (two) times daily. 11/30/20  Yes Anderson, Chelsey L, DO  clopidogrel (PLAVIX) 75 MG tablet TAKE 1 TABLET BY MOUTH EVERY DAY Patient taking differently: Take 75 mg by mouth daily. 10/19/20  Yes Anderson, Chelsey L, DO  ezetimibe (ZETIA) 10 MG tablet TAKE 1 TABLET BY MOUTH ONCE DAILY Patient taking differently: Take 10 mg by mouth daily. 11/13/20  Yes Anderson, Chelsey L, DO  FLUoxetine (PROZAC) 10 MG capsule Take 1 capsule (10 mg total) by mouth daily. 08/20/20  Yes Anderson, Chelsey L, DO  gabapentin (NEURONTIN) 300 MG capsule TAKE ONE (1) CAPSULE BY MOUTH EVERY NIGHT AT BEDTIME AS NEEDED Patient taking differently: Take 300 mg by mouth at bedtime. 11/13/20  Yes Anderson, Chelsey L, DO  metFORMIN (GLUCOPHAGE) 1000 MG tablet TAKE ONE (1) TABLET BY MOUTH TWICE DAILY WITH MEALS Patient taking differently: Take 1,000 mg by mouth 2 (two) times daily with a meal. 08/20/20  Yes Anderson, Chelsey L, DO  Nebivolol HCl (BYSTOLIC) 20 MG TABS Take 1 tablet (20 mg total) by mouth daily. 11/17/20  Yes Anderson, Chelsey L, DO  pantoprazole (PROTONIX) 40 MG tablet TAKE 1 TABLET BY MOUTH EVERY DAY BEFORE BREAKFAST Patient taking differently: Take 40 mg by mouth daily. 10/19/20  Yes Anderson, Chelsey L, DO  diclofenac Sodium (VOLTAREN) 1 % GEL Apply 2 g topically 4 (four) times daily. Patient not taking: No sig reported 05/27/20   Bonnita Hollow, MD  meloxicam (MOBIC) 7.5 MG tablet Take 1 tablet (7.5 mg total) by mouth daily as needed for up to 30 doses for pain. Patient not taking: No sig reported 10/13/20   Aundra Dubin, PA-C    Social History   Socioeconomic History  . Marital status: Significant Other    Spouse name: Not on file  . Number of children: 3  . Years of education: 69  . Highest education level: Some college, no degree  Occupational History  . Not on file  Tobacco Use   . Smoking status: Current Some Day Smoker    Years: 24.00    Types: Cigars  . Smokeless tobacco: Never Used  Vaping Use  . Vaping Use: Never used  Substance and Sexual Activity  . Alcohol use: Yes    Alcohol/week: 2.0 standard drinks    Types: 2 Cans of beer per week  . Drug use: No  . Sexual activity: Yes  Other Topics Concern  . Not on file  Social History Narrative   Lives alone. Lives in an apartment. Ground floor apartment. Smoke alarms present, no grab bars.    Has a shih-tzu, named Missy.    Patient eats a variety of foods. Drinks tea, Kool-aid, coffee      Use public transportation or  brother will take places.   Some food insecurity.      Social Determinants of Health   Financial Resource Strain: Not on file  Food Insecurity: Not on file  Transportation Needs: Not on file  Physical Activity: Not on file  Stress: Not on file  Social Connections: Not on file  Intimate Partner Violence: Not on file     Family History  Problem Relation Age of Onset  . Cancer Mother        colon Cancer  . Hyperlipidemia Mother   . Diabetes Mother   . Heart disease Father   . Hypertension Father   . Cancer Sister        Uterine  . Diabetes Sister   . Diabetes Brother     ROS: Otherwise negative unless mentioned in HPI  Physical Examination  Vitals:   12/05/20 1055 12/05/20 1105  BP: (!) 124/34 (!) 125/35  Pulse:  65  Resp: 17 (!) 21  Temp:    SpO2: 100% 97%   Body mass index is 26.87 kg/m.  General:  WDWN in NAD Gait: Not observed HENT: WNL, normocephalic Pulmonary: normal non-labored breathing, without Rales, rhonchi,  wheezing Cardiac: regular Abdomen:  soft, NT/ND, no masses Skin: without rashes Vascular Exam/Pulses: No palpable pedal pulses however feet are especially warm to touch good capillary refill Extremities: without ischemic changes, without Gangrene , without cellulitis; without open wounds;  Musculoskeletal: no muscle wasting or  atrophy  Neurologic: A&O X 3;  No focal weakness or paresthesias are detected; speech is fluent/normal Psychiatric:  The pt has Normal affect. Lymph:  Unremarkable  CBC    Component Value Date/Time   WBC 4.4 12/05/2020 0315   RBC 5.30 12/05/2020 0315   RBC 4.45 12/04/2020 2300   HGB 8.5 (L) 12/05/2020 0315   HGB 6.1 (LL) 11/30/2020 0944   HCT 33.0 (L) 12/05/2020 0315   HCT 26.4 (L) 11/30/2020 0944   PLT 272 12/05/2020 0315   PLT 288 11/30/2020 0944   MCV 62.3 (L) 12/05/2020 0315   MCV 57 (L) 11/30/2020 0944   MCH 16.0 (L) 12/05/2020 0315   MCHC 25.8 (L) 12/05/2020 0315   RDW 29.5 (H) 12/05/2020 0315   RDW 22.9 (H) 11/30/2020 0944   LYMPHSABS 1.8 12/19/2019 0935   LYMPHSABS 2.4 03/26/2018 1548   MONOABS 0.7 12/19/2019 0935   EOSABS 0.1 12/19/2019 0935   EOSABS 0.2 03/26/2018 1548   BASOSABS 0.0 12/19/2019 0935   BASOSABS 0.0 03/26/2018 1548    BMET    Component Value Date/Time   NA 135 12/05/2020 0315   NA 139 11/30/2020 0944   K 3.8 12/05/2020 0315   CL 103 12/05/2020 0315   CO2 22 12/05/2020 0315   GLUCOSE 105 (H) 12/05/2020 0315   BUN 5 (L) 12/05/2020 0315   BUN 10 11/30/2020 0944   CREATININE 0.69 12/05/2020 0315   CREATININE 0.94 03/31/2015 1454   CALCIUM 9.2 12/05/2020 0315   GFRNONAA >60 12/05/2020 0315   GFRAA 106 11/30/2020 0944    COAGS: Lab Results  Component Value Date   INR 1.1 12/05/2020   INR 1.1 04/24/2020   INR 1.1 12/19/2019     Non-Invasive Vascular Imaging:   No vascular studies   ASSESSMENT/PLAN: This is a 69 y.o. male with known PAD and ongoing right lower extremity pain  -Based on physical exam, right lower extremity is not acutely ischemic -Plans noted for endoscopy and colonoscopy by GI today -Patient was offered right lower extremity arteriogram due  to ongoing right lower extremity pain in -September when he saw Dr. Carlis Abbott -We could consider angiography during current hospitalization pending GI findings; patient will need to  be able to tolerate IV heparin given during angiography as well as aspirin and Plavix postoperatively -Vascular surgery will continue to follow along; on-call vascular surgeon Dr. Stanford Breed will evaluate the patient later today and provide further treatment plans   Dagoberto Ligas PA-C [facility] [phone]  VASCULAR STAFF ADDENDUM: I have independently interviewed and examined the patient. I agree with the above.  Chronic PAD exacerbated by severe anemia. No role for urgent revascularization. Will need to tolerate anticoagulation and antiplatelet therapy before we can proceed with angiogram Following along  Yevonne Aline. Stanford Breed, MD [facility] Phone Number: [phone] 12/05/2020 4:08 PM

## 2020-12-05 NOTE — Progress Notes (Signed)
Family Medicine Teaching Service Daily Progress Note Intern Pager: [phone]  Patient name: [name] Medical record number: [id number] Date of birth: [birth year] Age: 69 y.o. Gender: male  Primary Care Provider: Richarda Osmond, DO Consultants: GI, Vascular Surgery Code Status: FULL  Pt Overview and Major Events to Date:  Admitted: 12/10  Assessment and Plan: [name] is a 69 y.o. male presenting with symptomatic anemia, Hb 6 in clinic and bilateral leg pain. PMH is significant for peripheral arterial disease, type 2 diabetes mellitus, depression, chronic low back pain, diabetic neuropathy, iron deficiency anemia, obesity, tobacco use, hypertension, GERD, HLD, osteoarthritis.  Acute symptomatic iron deficiency anemia: concern for GI bleed/malignancy  Status post 2 units PRBCs.  Hemoglobin improved to 8.5 this morning from 7.5 last night.  Vitals stable apart from hypertension to 371I systolic. Asymptomatic, without headache, CP, N/V, back pain or visual changes. Patient is saturating well on room air and is not tachycardic.  Patient has been n.p.o. for endoscopy/colonoscopy today.   -Continue IV fluids -Daily CBC -Follow-up GI recs; n.p.o. for upper and lower endoscopy -IV Protonix 40 mg daily -SCDs for DVT prophylaxis -Vitals per floor routine -Continuous cardiac monitoring and pulse ox  Chronic Leg Pain  Peripheral artery occlusive disease Vascular surgery to see patient today.  Patient has followed with Dr. Carlis Abbott [facility] for his chronic bilateral leg pain from PAD.  He is status post right femoral endarterectomy with profundoplasty, thrombectomy of his left femoral prosthetic bypass and right external iliac stent.  Home medications include Tylenol, aspirin 81 mg daily, Plavix 75 mg daily, Cilostazol 50 mg twice daily (not started). -Holding aspirin, Plavix in the setting of acute anemia -Holding Tylenol, cilostazol as patient is n.p.o. -Follow-up vascular  recommendations  T2DM with diabetic neuropathy A.m. fasting CBG 103.  Home medications include Metformin 1000 mg twice daily and gabapentin 300 mg nightly.  It appears to be very well controlled as most recent hemoglobin A1c was 5.6%. -Hold Metformin -Hold gabapentin while n.p.o. -CBGs daily  Hypertension Hypertensive to 172/66.  Currently holding home medications due to n.p.o. status.  Home medications include Lotensin 5 mg and nebivolol 20 mg.  -Holding medications while n.p.o.  GERD Home medications include Zantac 75 mg twice daily and Protonix 40 mg -IV Protonix 40 mg daily  Depression Home medications include Prozac 10 mg daily -Holding medications while patient is n.p.o  Hyperlipidemia Home meds include Lipitor 40 mg daily, Zetia 10 mg daily. -Holding home medications while n.p.o.  Tobacco use -Encourage cessation -Nicotine patch daily  FEN/GI: NPO; Heart healthy diet after procedure PPx: SCD's   Status is: Inpatient  Remains inpatient appropriate because:Ongoing diagnostic testing needed not appropriate for outpatient work up   Dispo: The patient is from: Home              Anticipated d/c is to: Home              Anticipated d/c date is: 2 days              Patient currently is not medically stable to d/c.   Subjective:  Patient states that he continues to have some leg pain. It has been worsening for the past month which was the reason he made an appointment with his PCP. Patient has been having chronic pain in his legs for the past 10 years. Patient is scheduled for endoscopy and colonoscopy this AM. He is hungry and looking forward to eating after the procedure. He denies  abdominal pain, bloody stools, N/V. Does not admit to NSAID use, usually takes Tylenol for pain. He does not drink frequently.   Patient also has not had his home blood pressure for the last 2 days. He is normally around 569 systolic. His BP is elevated this AM but he denies CP, back pain,  visual changes, N/V, headache.   Objective: Temp:  [97.4 F (36.3 C)-98.6 F (37 C)] 98.3 F (36.8 C) (12/11 0647) Pulse Rate:  [57-86] 67 (12/11 0647) Resp:  [13-26] 18 (12/11 0647) BP: (92-176)/(41-99) 172/66 (12/11 0647) SpO2:  [96 %-100 %] 100 % (12/11 0647) Weight:  [75.5 kg] 75.5 kg (12/11 0653) Physical Exam: General: NAD, laying in bed, pleasant Cardiovascular: Normal S1, S2, regular rate Respiratory: CTAB without wheezing, rhonchi or rales Abdomen: Non-tender in all quadrants, non-distended, hyperactive bowel sounds, no R/G Extremities: 2+ femoral and radial pulses, unable to palpate DP or PT pulses, extremities only slightly cold, without tenderness and dry   Laboratory: Recent Labs  Lab 11/30/20 0944 12/04/20 0930 12/04/20 2300 12/05/20 0315  WBC 4.9 4.8  --  4.4  HGB 6.1* 5.6* 7.5* 8.5*  HCT 26.4* 24.0* 27.0* 33.0*  PLT 288 286  --  272   Recent Labs  Lab 11/30/20 0944 12/04/20 0930 12/05/20 0315  NA 139 136 135  K 4.6 3.7 3.8  CL 102 102 103  CO2 21 21* 22  BUN 10 8 5*  CREATININE 0.78 0.85 0.69  CALCIUM 8.9 9.2 9.2  PROT  --  7.4  --   BILITOT  --  0.3  --   ALKPHOS  --  50  --   ALT  --  10  --   AST  --  19  --   GLUCOSE 91 104* 105*    Imaging/Diagnostic Tests: None new.  Sharion Settler, DO 12/05/2020, 8:43 AM PGY-1, [place] Intern pager: [phone], text pages welcome

## 2020-12-05 NOTE — Op Note (Addendum)
[facility] Patient Name: [name] Procedure Date : 12/05/2020 MRN: [id number] Attending MD: Ladene Artist , MD Date of Birth: 1951-02-02 CSN: [id number] Age: 69 Admit Type: Inpatient Procedure:                Colonoscopy Indications:              Unexplained iron deficiency anemia, History of                            adenomatous colon polyps. Providers:                Pricilla Riffle. Fuller Plan, MD, Kary Kos RN, RN, Benetta Spar, Technician, Laverda Sorenson, Technician Referring MD:             Blane Ohara McDiarmid, MD Medicines:                Fentanyl 75 micrograms IV, Midazolam 6 mg IV Complications:            No immediate complications. Estimated blood loss:                            None. Estimated Blood Loss:     Estimated blood loss: none. Procedure:                Pre-Anesthesia Assessment:                           - Prior to the procedure, a History and Physical                            was performed, and patient medications and                            allergies were reviewed. The patient's tolerance of                            previous anesthesia was also reviewed. The risks                            and benefits of the procedure and the sedation                            options and risks were discussed with the patient.                            All questions were answered, and informed consent                            was obtained. Prior Anticoagulants: The patient has                            taken no previous anticoagulant or antiplatelet  agents. ASA Grade Assessment: II - A patient with                            mild systemic disease. After reviewing the risks                            and benefits, the patient was deemed in                            satisfactory condition to undergo the procedure.                           After obtaining informed consent, the colonoscope                             was passed under direct vision. Throughout the                            procedure, the patient's blood pressure, pulse, and                            oxygen saturations were monitored continuously. The                            CF-HQ190L ([id number]) Olympus colonoscope was                            introduced through the anus and advanced to the the                            cecum, identified by appendiceal orifice and                            ileocecal valve. The ileocecal valve, appendiceal                            orifice, and rectum were photographed. The quality                            of the bowel preparation was good. The colonoscopy                            was performed without difficulty. The patient                            tolerated the procedure well. Scope In: 10:18:54 AM Scope Out: 10:33:59 AM Scope Withdrawal Time: 0 hours 9 minutes 21 seconds  Total Procedure Duration: 0 hours 15 minutes 5 seconds  Findings:      The perianal and digital rectal examinations were normal.      Internal hemorrhoids and hypertrophied anal papillae were found during       retroflexion. The hemorrhoids were small and Grade I (internal       hemorrhoids that do not prolapse).      The exam  was otherwise without abnormality on direct and retroflexion       views. Impression:               - Internal hemorrhoids and hypertrophied anal                            papillae.                           - The examination was otherwise normal on direct                            and retroflexion views.                           - No specimens collected. Moderate Sedation:      Moderate (conscious) sedation was administered by the endoscopy nurse       and supervised by the endoscopist. The following parameters were       monitored: oxygen saturation, heart rate, blood pressure, respiratory       rate, EKG, adequacy of pulmonary ventilation, and response to care.        Total physician intraservice time was 29 minutes. Recommendation:           - Repeat colonoscopy for surveillance, timing per                            Dr. Collene Mares.                           - Return patient to hospital ward for ongoing care.                           - The signs and symptoms of potential delayed                            complications were discussed with the patient.                           - Resume previous diet.                           - Continue present medications.                           - Proceed with EGD today. Procedure Code(s):        --- Professional ---                           [phone], Colonoscopy, flexible; diagnostic, including                            collection of specimen(s) by brushing or washing,                            when performed (separate procedure)  30160, Moderate sedation; each additional 15                            minutes intraservice time                           G0500, Moderate sedation services provided by the                            same physician or other qualified health care                            professional performing a gastrointestinal                            endoscopic service that sedation supports,                            requiring the presence of an independent trained                            observer to assist in the monitoring of the                            patient's level of consciousness and physiological                            status; initial 15 minutes of intra-service time;                            patient age 34 years or older (additional time may                            be reported with [phone], as appropriate) Diagnosis Code(s):        --- Professional ---                           K64.0, First degree hemorrhoids                           D50.9, Iron deficiency anemia, unspecified CPT copyright 2019 American Medical Association. All rights reserved. The  codes documented in this report are preliminary and upon coder review may  be revised to meet current compliance requirements. Ladene Artist, MD 12/05/2020 10:42:31 AM This report has been signed electronically. Number of Addenda: 0

## 2020-12-06 ENCOUNTER — Encounter (HOSPITAL_COMMUNITY): Payer: Self-pay | Admitting: Gastroenterology

## 2020-12-07 ENCOUNTER — Ambulatory Visit (INDEPENDENT_AMBULATORY_CARE_PROVIDER_SITE_OTHER): Payer: Medicare Other | Admitting: Student in an Organized Health Care Education/Training Program

## 2020-12-07 ENCOUNTER — Encounter: Payer: Self-pay | Admitting: Student in an Organized Health Care Education/Training Program

## 2020-12-07 ENCOUNTER — Other Ambulatory Visit: Payer: Self-pay

## 2020-12-07 VITALS — BP 132/75 | HR 76 | Ht 68.0 in | Wt 176.8 lb

## 2020-12-07 DIAGNOSIS — R252 Cramp and spasm: Secondary | ICD-10-CM | POA: Diagnosis not present

## 2020-12-07 DIAGNOSIS — D6489 Other specified anemias: Secondary | ICD-10-CM

## 2020-12-07 DIAGNOSIS — Z720 Tobacco use: Secondary | ICD-10-CM | POA: Diagnosis not present

## 2020-12-07 DIAGNOSIS — D649 Anemia, unspecified: Secondary | ICD-10-CM

## 2020-12-07 DIAGNOSIS — I779 Disorder of arteries and arterioles, unspecified: Secondary | ICD-10-CM | POA: Diagnosis not present

## 2020-12-07 NOTE — Patient Instructions (Signed)
It was a pleasure to see you today!  To summarize our discussion for this visit:  I'm so happy to hear that you are feeling more energized and in much less pain after your hospital stay.   We are checking your hemoglobin today to see if your body is maintaining on it's own. If it is staying stable, we will recheck in about 4 weeks. Let me know sooner if you are having return of any of your symptoms or notice any bleeding.  One of the most important things you can do to help yourself stay healthy and pain free is to quit smoking! Call this 1-800 number and let me know if you'd like me to help out with any medications.   Some additional health maintenance measures we should update are: Health Maintenance Due  Topic Date Due  . FOOT EXAM  01/19/2020  . OPHTHALMOLOGY EXAM  11/25/2020  .    Call the clinic at [phone] if your symptoms worsen or you have any concerns.   Thank you for allowing me to take part in your care,  Dr. Doristine Mango   Steps to Quit Smoking Smoking tobacco is the leading cause of preventable death. It can affect almost every organ in the body. Smoking puts you and those around you at risk for developing many serious chronic diseases. Quitting smoking can be difficult, but it is one of the best things that you can do for your health. It is never too late to quit. How do I get ready to quit? When you decide to quit smoking, create a plan to help you succeed. Before you quit:  Pick a date to quit. Set a date within the next 2 weeks to give you time to prepare.  Write down the reasons why you are quitting. Keep this list in places where you will see it often.  Tell your family, friends, and co-workers that you are quitting. Support from your loved ones can make quitting easier.  Talk with your health care provider about your options for quitting smoking.  Find out what treatment options are covered by your health insurance.  Identify people, places,  things, and activities that make you want to smoke (triggers). Avoid them. What first steps can I take to quit smoking?  Throw away all cigarettes at home, at work, and in your car.  Throw away smoking accessories, such as Scientist, research (medical).  Clean your car. Make sure to empty the ashtray.  Clean your home, including curtains and carpets. What strategies can I use to quit smoking? Talk with your health care provider about combining strategies, such as taking medicines while you are also receiving in-person counseling. Using these two strategies together makes you more likely to succeed in quitting than if you used either strategy on its own.  If you are pregnant or breastfeeding, talk with your health care provider about finding counseling or other support strategies to quit smoking. Do not take medicine to help you quit smoking unless your health care provider tells you to do so. To quit smoking: Quit right away  Quit smoking completely, instead of gradually reducing how much you smoke over a period of time. Research shows that stopping smoking right away is more successful than gradually quitting.  Attend in-person counseling to help you build problem-solving skills. You are more likely to succeed in quitting if you attend counseling sessions regularly. Even short sessions of 10 minutes can be effective. Take medicine You may take medicines to  help you quit smoking. Some medicines require a prescription and some you can purchase over-the-counter. Medicines may have nicotine in them to replace the nicotine in cigarettes. Medicines may:  Help to stop cravings.  Help to relieve withdrawal symptoms. Your health care provider may recommend:  Nicotine patches, gum, or lozenges.  Nicotine inhalers or sprays.  Non-nicotine medicine that is taken by mouth. Find resources Find resources and support systems that can help you to quit smoking and remain smoke-free after you quit. These  resources are most helpful when you use them often. They include:  Online chats with a Social worker.  Telephone quitlines.  Printed Furniture conservator/restorer.  Support groups or group counseling.  Text messaging programs.  Mobile phone apps or applications. Use apps that can help you stick to your quit plan by providing reminders, tips, and encouragement. There are many free apps for mobile devices as well as websites. Examples include Quit Guide from the [employer] and smokefree.gov What things can I do to make it easier to quit?   Reach out to your family and friends for support and encouragement. Call telephone quitlines (1-800-QUIT-NOW), reach out to support groups, or work with a counselor for support.  Ask people who smoke to avoid smoking around you.  Avoid places that trigger you to smoke, such as bars, parties, or smoke-break areas at work.  Spend time with people who do not smoke.  Lessen the stress in your life. Stress can be a smoking trigger for some people. To lessen stress, try: ? Exercising regularly. ? Doing deep-breathing exercises. ? Doing yoga. ? Meditating. ? Performing a body scan. This involves closing your eyes, scanning your body from head to toe, and noticing which parts of your body are particularly tense. Try to relax the muscles in those areas. How will I feel when I quit smoking? Day 1 to 3 weeks Within the first 24 hours of quitting smoking, you may start to feel withdrawal symptoms. These symptoms are usually most noticeable 2-3 days after quitting, but they usually do not last for more than 2-3 weeks. You may experience these symptoms:  Mood swings.  Restlessness, anxiety, or irritability.  Trouble concentrating.  Dizziness.  Strong cravings for sugary foods and nicotine.  Mild weight gain.  Constipation.  Nausea.  Coughing or a sore throat.  Changes in how the medicines that you take for unrelated issues work in your  body.  Depression.  Trouble sleeping (insomnia). Week 3 and afterward After the first 2-3 weeks of quitting, you may start to notice more positive results, such as:  Improved sense of smell and taste.  Decreased coughing and sore throat.  Slower heart rate.  Lower blood pressure.  Clearer skin.  The ability to breathe more easily.  Fewer sick days. Quitting smoking can be very challenging. Do not get discouraged if you are not successful the first time. Some people need to make many attempts to quit before they achieve long-term success. Do your best to stick to your quit plan, and talk with your health care provider if you have any questions or concerns. Summary  Smoking tobacco is the leading cause of preventable death. Quitting smoking is one of the best things that you can do for your health.  When you decide to quit smoking, create a plan to help you succeed.  Quit smoking right away, not slowly over a period of time.  When you start quitting, seek help from your health care provider, family, or friends. This information  is not intended to replace advice given to you by your health care provider. Make sure you discuss any questions you have with your health care provider. Document Revised: 09/06/2019 Document Reviewed: 03/02/2019 Elsevier Patient Education  [place].

## 2020-12-07 NOTE — Progress Notes (Signed)
   SUBJECTIVE:   CHIEF COMPLAINT / HPI: hosp f/u  Symptomatic anemia- patient received upper and lower endoscopy without clear source of blood loss found. Received feraheme and 2u pRBCs. Discharged with hgb 8.5. discontinued pletal, ASA, and mobic. Recommended f/u with Dr. Collene Mares for further GI workup.  PAOD- symptoms are greatly improved.  Tobacco use- patient had no cravings while inpatient and using nicotine patch. He is very motivated to quit smoking at this time.   OBJECTIVE:   BP 132/75   Pulse 76   Ht 5\' 8"  (1.727 m)   Wt 176 lb 12.8 oz (80.2 kg)   SpO2 100%   BMI 26.88 kg/m   General: NAD, pleasant, able to participate in exam Extremities: no edema.  LE Neg pain with ROM and ambulation. No pain with palpation. Distal pulses present, warm. Skin: warm and dry, no rashes noted Neuro: alert and oriented, no focal deficits Psych: Normal affect and mood  ASSESSMENT/PLAN:   Symptomatic anemia Repeat CBC today  PAOD (peripheral arterial occlusive disease) (HCC) Improved after treatment of anemia Follow up with [facility] Continue to hold pletal, ASA, plavix while anemia is unstable Continue exercise routine as tolerated  Tobacco abuse Patient is open to help with smoking cessation. Discussed chantix which patient has used in the past without improvement. Provided with 1-800-quit now resource again and recommended nicotine replacement therapy as this seemed to have worked for the patient well in the hospital   Update: CBCB showed hgb again decerased to 7.8 from 8.5 on discharge.  Reaching out to [facility] for further management. Setting up outpatient iron transfusion- form completed and nurse pool messaged. Recheck CBC in a few days and consider pRBC if hgb continues below 8.   [place]

## 2020-12-08 ENCOUNTER — Other Ambulatory Visit: Payer: Self-pay | Admitting: Physician Assistant

## 2020-12-08 LAB — CBC
Hematocrit: 29.6 % — ABNORMAL LOW (ref 37.5–51.0)
Hemoglobin: 7.8 g/dL — ABNORMAL LOW (ref 13.0–17.7)
MCH: 16.3 pg — ABNORMAL LOW (ref 26.6–33.0)
MCHC: 26.4 g/dL — ABNORMAL LOW (ref 31.5–35.7)
MCV: 62 fL — ABNORMAL LOW (ref 79–97)
NRBC: 1 % — ABNORMAL HIGH (ref 0–0)
Platelets: 312 10*3/uL (ref 150–450)
RBC: 4.8 x10E6/uL (ref 4.14–5.80)
RDW: 26.8 % — ABNORMAL HIGH (ref 11.6–15.4)
WBC: 5.9 10*3/uL (ref 3.4–10.8)

## 2020-12-09 ENCOUNTER — Encounter: Payer: Self-pay | Admitting: Gastroenterology

## 2020-12-09 LAB — SURGICAL PATHOLOGY

## 2020-12-09 NOTE — Assessment & Plan Note (Signed)
Patient is open to help with smoking cessation. Discussed chantix which patient has used in the past without improvement. Provided with 1-800-quit now resource again and recommended nicotine replacement therapy as this seemed to have worked for the patient well in the hospital

## 2020-12-09 NOTE — Assessment & Plan Note (Addendum)
Improved after treatment of anemia Follow up with [facility] Continue to hold pletal, ASA, plavix while anemia is unstable Continue exercise routine as tolerated

## 2020-12-09 NOTE — Assessment & Plan Note (Signed)
Repeat CBC today 

## 2020-12-10 ENCOUNTER — Telehealth: Payer: Self-pay

## 2020-12-10 NOTE — Telephone Encounter (Signed)
-----   Message from Richarda Osmond, DO sent at 12/09/2020 11:17 AM EST ----- Regarding: outpatient transfusion Hey team,  I have completed an order form for outpatient transfusion for this patient and he is aware that he needs the procedure. Can you please assist in setting up the scheduling? The form is in the RN team basket. Thank you, [place]

## 2020-12-10 NOTE — Telephone Encounter (Signed)
Feraheme request form completed on behalf of Dr. Prince Rome and was handed back to [employer].

## 2020-12-10 NOTE — Telephone Encounter (Signed)
Called and LVM at [facility] at [phone] to call me with appointment for patient.  Faxed order to [phone].  Ozella Almond, [place]

## 2020-12-14 ENCOUNTER — Telehealth: Payer: Self-pay | Admitting: *Deleted

## 2020-12-14 ENCOUNTER — Telehealth: Payer: Self-pay

## 2020-12-14 NOTE — Telephone Encounter (Signed)
-----   Message from Richarda Osmond, DO sent at 12/09/2020 11:17 AM EST ----- Regarding: outpatient transfusion Hey team,  I have completed an order form for outpatient transfusion for this patient and he is aware that he needs the procedure. Can you please assist in setting up the scheduling? The form is in the RN team basket. Thank you, [place]

## 2020-12-14 NOTE — Telephone Encounter (Signed)
Looks like pt is already scheduled. Marieta Markov Kennon Holter, CMA

## 2020-12-14 NOTE — Telephone Encounter (Signed)
Called patient to inform him of appointment.  Infusion 12/16/2020 [place] 1300 with show time 5391   Patient verbalized understanding and was appreciative.  Ozella Almond, [place]

## 2020-12-15 ENCOUNTER — Other Ambulatory Visit (HOSPITAL_COMMUNITY): Payer: Self-pay | Admitting: *Deleted

## 2020-12-15 ENCOUNTER — Encounter (HOSPITAL_COMMUNITY): Payer: Medicare Other

## 2020-12-15 ENCOUNTER — Ambulatory Visit: Payer: Medicare Other | Admitting: Vascular Surgery

## 2020-12-15 NOTE — Discharge Instructions (Signed)

## 2020-12-16 ENCOUNTER — Ambulatory Visit (HOSPITAL_COMMUNITY)
Admission: RE | Admit: 2020-12-16 | Discharge: 2020-12-16 | Disposition: A | Discharge status: Home / Self Care | Payer: Medicare Other | Source: Ambulatory Visit | Attending: Family Medicine | Admitting: Family Medicine

## 2020-12-16 DIAGNOSIS — D509 Iron deficiency anemia, unspecified: Secondary | ICD-10-CM | POA: Insufficient documentation

## 2020-12-16 MED ORDER — SODIUM CHLORIDE 0.9 % IV SOLN
510.0000 mg | Freq: Once | INTRAVENOUS | Status: AC
  Administered 2020-12-16: 510 mg via INTRAVENOUS
  Filled 2020-12-16: qty 510
  Filled 2020-12-16: qty 17

## 2020-12-29 ENCOUNTER — Ambulatory Visit: Payer: Medicare Other | Admitting: Student in an Organized Health Care Education/Training Program

## 2021-01-04 ENCOUNTER — Other Ambulatory Visit: Payer: Self-pay

## 2021-01-04 ENCOUNTER — Ambulatory Visit (INDEPENDENT_AMBULATORY_CARE_PROVIDER_SITE_OTHER): Payer: Medicare HMO | Admitting: Student in an Organized Health Care Education/Training Program

## 2021-01-04 ENCOUNTER — Encounter: Payer: Self-pay | Admitting: Student in an Organized Health Care Education/Training Program

## 2021-01-04 VITALS — BP 142/82 | HR 68 | Wt 176.4 lb

## 2021-01-04 DIAGNOSIS — Z720 Tobacco use: Secondary | ICD-10-CM

## 2021-01-04 DIAGNOSIS — D500 Iron deficiency anemia secondary to blood loss (chronic): Secondary | ICD-10-CM

## 2021-01-04 DIAGNOSIS — R197 Diarrhea, unspecified: Secondary | ICD-10-CM | POA: Diagnosis not present

## 2021-01-04 DIAGNOSIS — I779 Disorder of arteries and arterioles, unspecified: Secondary | ICD-10-CM

## 2021-01-04 LAB — POCT HEMOGLOBIN: Hemoglobin: 10 g/dL — AB (ref 11–14.6)

## 2021-01-04 MED ORDER — CILOSTAZOL 100 MG PO TABS: 100.0000 mg | ORAL_TABLET | Freq: Two times a day (BID) | ORAL | 1 refills | Status: DC

## 2021-01-04 NOTE — Progress Notes (Signed)
   SUBJECTIVE:   CHIEF COMPLAINT / HPI: legs hurt  H/o POAD-poorly controlled.  Pain is consistent with symptoms from his obstructive disease which was improved with vascular interventions and blood transfusion recently.  Has not had any active bleeding, no red or dark black stools.  He is here today to request further help with this problem.  Per vascular notes, he is likely towards the end of percutaneous interventions and would be a candidate for amputation if his pain continues to be uncontrolled.  Patient does not want to follow-up with his current vascular surgeon for personal reasons and is asking for referral for second opinion today.  They feel very cold to him and tries to keep them warm with a heating blanket.  He would like to try Pletal again.  Diarrhea x1 week. Vaccinated and booster. imodium helped. Seems like everything is just going through him. 3 times per day. No blood, fever. Started since last transfusion.   Last eye exam 08/2020. He thinks he's due for another appointment  Smoking cessation- texting multiple times and a phone call with the smoking cessation hotline  OBJECTIVE:   BP (!) 142/82   Pulse 68   Wt 176 lb 6.4 oz (80 kg)   SpO2 99%   BMI 26.82 kg/m   140/80 on repeat by provider General: NAD, pleasant, able to participate in exam Extremities:  LE- no edema. Warm. Difficult to palpate distal pulses.  Skin: warm and dry, no rashes noted Neuro: alert and oriented, no focal deficits Psych: Normal affect and mood  ASSESSMENT/PLAN:   PAOD (peripheral arterial occlusive disease) (HCC) Anemia has exacerbated in the past.  hgb today is 10 so unlikely major contributor to pain Patient is candidate for amputation given continued pain and VSS has commented that there is little more PCI left for him.  Patient does not want to follow up with his prior VSS and would like a second opinion.  - referral placed for VSS second opinion - pletal, increased activity passed  pain, compression stockings to maximize any measures we can to help - provided patient with s&s to prompt immediate presentation to ED  Diarrhea Continue good hydration. Follow for a few more days and consider working up if not improved  Tobacco abuse Continue coaching with smoking hotline     Richarda Osmond, [place]

## 2021-01-04 NOTE — Patient Instructions (Signed)
It was a pleasure to see you today!  To summarize our discussion for this visit:  For your leg pain, we can try pletal to help but ultimately your fix is going to be by vascular surgery for either stents or amputation to fix your pain. I sent in a referral for a second opinion. In the meantime, try to keep as active as possible, use compression stockings and look out for darkening/dusky spots on your feet which would be an emergency and you'd need to go to the emergency room.  Your hemoglobin looked good today.  Sounds like you have a GI bug. Keep hydrated from the diarrhea and if it is still going on in a week, let me know. Things to watch out for are blood in your stool, fever, or not improving.   Some additional health maintenance measures we should update are: Health Maintenance Due  Topic Date Due  . FOOT EXAM  01/19/2020  . OPHTHALMOLOGY EXAM  11/25/2020  .    Please return to our clinic to see me in about 2 months.  Call the clinic at [phone] if your symptoms worsen or you have any concerns.   Thank you for allowing me to take part in your care,  Dr. Doristine Mango

## 2021-01-07 DIAGNOSIS — R197 Diarrhea, unspecified: Secondary | ICD-10-CM | POA: Insufficient documentation

## 2021-01-07 NOTE — Assessment & Plan Note (Signed)
Continue good hydration. Follow for a few more days and consider working up if not improved

## 2021-01-07 NOTE — Assessment & Plan Note (Signed)
Continue coaching with smoking hotline

## 2021-01-07 NOTE — Assessment & Plan Note (Signed)
Anemia has exacerbated in the past.  hgb today is 10 so unlikely major contributor to pain Patient is candidate for amputation given continued pain and VSS has commented that there is little more PCI left for him.  Patient does not want to follow up with his prior VSS and would like a second opinion.  - referral placed for VSS second opinion - pletal, increased activity passed pain, compression stockings to maximize any measures we can to help - provided patient with s&s to prompt immediate presentation to ED

## 2021-01-22 DIAGNOSIS — H2513 Age-related nuclear cataract, bilateral: Secondary | ICD-10-CM | POA: Diagnosis not present

## 2021-01-22 DIAGNOSIS — E119 Type 2 diabetes mellitus without complications: Secondary | ICD-10-CM | POA: Diagnosis not present

## 2021-01-22 DIAGNOSIS — H35033 Hypertensive retinopathy, bilateral: Secondary | ICD-10-CM | POA: Diagnosis not present

## 2021-01-22 DIAGNOSIS — H25013 Cortical age-related cataract, bilateral: Secondary | ICD-10-CM | POA: Diagnosis not present

## 2021-01-22 DIAGNOSIS — H35013 Changes in retinal vascular appearance, bilateral: Secondary | ICD-10-CM | POA: Diagnosis not present

## 2021-01-22 LAB — HM DIABETES EYE EXAM

## 2021-01-27 ENCOUNTER — Other Ambulatory Visit: Payer: Self-pay | Admitting: Student in an Organized Health Care Education/Training Program

## 2021-02-04 ENCOUNTER — Other Ambulatory Visit: Payer: Self-pay | Admitting: Student in an Organized Health Care Education/Training Program

## 2021-02-10 ENCOUNTER — Other Ambulatory Visit: Payer: Self-pay | Admitting: Physician Assistant

## 2021-02-10 ENCOUNTER — Telehealth: Payer: Self-pay

## 2021-02-10 ENCOUNTER — Encounter: Payer: Self-pay | Admitting: Student in an Organized Health Care Education/Training Program

## 2021-02-10 ENCOUNTER — Ambulatory Visit (INDEPENDENT_AMBULATORY_CARE_PROVIDER_SITE_OTHER): Payer: Medicare HMO | Admitting: Student in an Organized Health Care Education/Training Program

## 2021-02-10 ENCOUNTER — Other Ambulatory Visit: Payer: Self-pay

## 2021-02-10 DIAGNOSIS — I779 Disorder of arteries and arterioles, unspecified: Secondary | ICD-10-CM | POA: Diagnosis not present

## 2021-02-10 DIAGNOSIS — I739 Peripheral vascular disease, unspecified: Secondary | ICD-10-CM

## 2021-02-10 MED ORDER — TRAMADOL HCL 50 MG PO TABS: 50.0000 mg | ORAL_TABLET | Freq: Four times a day (QID) | ORAL | 0 refills | Status: DC | PRN

## 2021-02-10 NOTE — Telephone Encounter (Signed)
Patient has extensive vascular surgery history. He was supposed to have a follow up in December, but missed appt. He has seen his podiatrist today - his toes are turning black with what looks like dry gangrene. Called patient, his left foot has been painful and cool for 3 weeks now. Placed him on schedule for duplex on Friday and MD visit at the beginning of next week. He is advised to call back prior to appt if he starts running a fever or develops any other s/s of infection.

## 2021-02-10 NOTE — Progress Notes (Addendum)
   SUBJECTIVE:   CHIEF COMPLAINT / HPI: left foot pain  PAOD-chronic pain present in left foot. Patient has not heard from new vascular referral and upon chart review it appears there is no other option with in [place] so patient would have to seek care further away. Patient decided that he would prefer to go back to his previous vascular surgeon for further treatment. The pain in his foot has worsened despite maximizing medical and exercise therapy. Was previously able to walk about 25 yards and now has severe pain when walking between bedroom and bathroom. He is aware that amputation is a very likely step in the progression of his disease and he seems to have come to an acceptance of this fact.  Tobacco use-patient has cut back to half cigar per week. Continues getting texts from the one 800 quit hotline.  OBJECTIVE:   BP (!) 141/80   Pulse 94   Ht 5\' 7"  (1.702 m)   Wt 167 lb 6.4 oz (75.9 kg)   SpO2 99%   BMI 26.22 kg/m   General: NAD, pleasant, able to participate in exam Extremities: no edema. Left foot erythematous with scaling skin and black gangrenous ulcer between second and third toes. Painful to palpation on entire foot. Unable to palpate pulses pedal or tibial. Please see clinical image for further detail. Neuro: alert and oriented Psych: Normal affect and mood   ASSESSMENT/PLAN:   PAOD (peripheral arterial occlusive disease) (HCC) Pain worsens despite maximizing conservative therapies. Unfortunately, patient was unable to be seen by alternate vascular surgeon so I highly recommended he call his current vascular surgeon today to set up an urgent appointment to be seen. Discussed the very likely an imminent possibility of needing an amputation. Patient agreed to this plan and endorse that he would call his vascular surgeon right after this appointment to be seen as soon as possible -Prescribe tramadol for pain acutely -Recommended he stop Pletal -I shared with patient my  concerns for spreading infection or critical ischemia. We discussed the signs and symptoms of these possibilities including fever, spreading redness or unbearable pain and he agreed that if these were to occur he would go to the emergency department     Richarda Osmond, [place]

## 2021-02-10 NOTE — Patient Instructions (Signed)
It was a pleasure to see you today!  To summarize our discussion for this visit:  Unfortunately, your foot is looking worse from your last visit and we were unable to get a referral to new vascular surgeon locally. I would recommend following up with Dr. Carlis Abbott ASAP.  Please stop the pletal at this time.  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   Some additional health maintenance measures we should update are: Health Maintenance Due  Topic Date Due  . FOOT EXAM  01/19/2020  . OPHTHALMOLOGY EXAM  11/25/2020  .    Call the clinic at [phone] if your symptoms worsen or you have any concerns.   Thank you for allowing me to take part in your care,  Dr. Doristine Mango

## 2021-02-11 ENCOUNTER — Other Ambulatory Visit: Payer: Self-pay | Admitting: Student in an Organized Health Care Education/Training Program

## 2021-02-12 ENCOUNTER — Ambulatory Visit (INDEPENDENT_AMBULATORY_CARE_PROVIDER_SITE_OTHER)
Admission: RE | Admit: 2021-02-12 | Discharge: 2021-02-12 | Disposition: A | Discharge status: Home / Self Care | Payer: Medicare HMO | Source: Ambulatory Visit | Attending: Vascular Surgery | Admitting: Vascular Surgery

## 2021-02-12 ENCOUNTER — Ambulatory Visit (HOSPITAL_COMMUNITY)
Admission: RE | Admit: 2021-02-12 | Discharge: 2021-02-12 | Disposition: A | Discharge status: Home / Self Care | Payer: Medicare HMO | Source: Ambulatory Visit | Attending: Vascular Surgery | Admitting: Vascular Surgery

## 2021-02-12 ENCOUNTER — Other Ambulatory Visit: Payer: Self-pay

## 2021-02-12 DIAGNOSIS — I739 Peripheral vascular disease, unspecified: Secondary | ICD-10-CM | POA: Insufficient documentation

## 2021-02-12 NOTE — Assessment & Plan Note (Addendum)
Pain worsens despite maximizing conservative therapies. Unfortunately, patient was unable to be seen by alternate vascular surgeon so I highly recommended he call his current vascular surgeon today to set up an urgent appointment to be seen. Discussed the very likely an imminent possibility of needing an amputation. Patient agreed to this plan and endorse that he would call his vascular surgeon right after this appointment to be seen as soon as possible -Prescribe tramadol for pain acutely -Recommended he stop Pletal -I shared with patient my concerns for spreading infection or critical ischemia. We discussed the signs and symptoms of these possibilities including fever, spreading redness or unbearable pain and he agreed that if these were to occur he would go to the emergency department

## 2021-02-16 ENCOUNTER — Ambulatory Visit (INDEPENDENT_AMBULATORY_CARE_PROVIDER_SITE_OTHER): Payer: Medicare HMO | Admitting: Physician Assistant

## 2021-02-16 ENCOUNTER — Encounter: Payer: Self-pay | Admitting: Physician Assistant

## 2021-02-16 ENCOUNTER — Other Ambulatory Visit: Payer: Self-pay

## 2021-02-16 ENCOUNTER — Other Ambulatory Visit: Payer: Self-pay | Admitting: Student in an Organized Health Care Education/Training Program

## 2021-02-16 VITALS — BP 130/80 | HR 81 | Temp 97.9°F | Resp 16 | Ht 66.0 in | Wt 170.0 lb

## 2021-02-16 DIAGNOSIS — I739 Peripheral vascular disease, unspecified: Secondary | ICD-10-CM

## 2021-02-16 DIAGNOSIS — I70229 Atherosclerosis of native arteries of extremities with rest pain, unspecified extremity: Secondary | ICD-10-CM | POA: Diagnosis not present

## 2021-02-16 MED ORDER — OXYCODONE-ACETAMINOPHEN 5-325 MG PO TABS: 1.0000 | ORAL_TABLET | ORAL | 0 refills | Status: DC | PRN

## 2021-02-16 NOTE — Progress Notes (Signed)
Office Note     CC:  follow up Requesting Provider:  Richarda Osmond, DO  HPI: [name] is a 70 y.o. (July 20, 1951) male who presents for follow up of peripheral artery disease. He has extensive vascular surgery history including most recently a right femoral endarterectomy and profundoplasty with bovine pericardial patch on 04/24/2020 by Dr. Carlis Abbott for critical limb ischemia with rest pain. On initial postop was noted to have a small open sinus in his right groin with no drainage and this ultimately healed. He also has had a thrombectomy of his left femoral to peroneal bypass, thrombectomy of the left peroneal artery and bovine pericardial patch angioplasty of the distal anastomosis on 12/19/19 by Dr. Carlis Abbott.  The left femoral to peroneal bypass was initially placed by Dr. Kellie Simmering in January of 2017 and was redone by Dr. Scot Dock in November of 2017. He has also had a previous right EIA stent with 7 mm x 29 mm VBX in 2018 by Dr. Scot Dock  At time of his last visit in September he saw Dr. Carlis Abbott. He was overall doing well with minimal claudication symptoms of bilateral lower extremities, right greater than left. He felt the pain at the time was different from the rest pain he had prior to his femoral endarterectomy. He had a duplex that showed elevated velocities in the right external iliac artery but at the time he was not interested in having any procedures due to COVID. He was schedule for a 3 month follow up with non invasive studies. He did not show up for his 3 month follow up in December of 2021  Today he presents with very significant rest pain in the left foot. He says approximately 4 weeks ago now the pain began suddenly.  Since then the left foot has been very cold and he has had progressive ulceration with blackening of his left 3rd toe. He says he is unable to sleep at night and has been taking tylenol to take edge off of pain. He can barely bear any weight on the left foot. He was seen at  his PCP office on 2/18 and they gave him some tramadol but he has completed those as well due to significant pain. He denies any pain in the right lower extremity.  The pt is on a statin for cholesterol management. Also takes Zetia The pt is not on a daily aspirin.   Other AC:  none The pt is on ACE for hypertension.   The pt is diabetic.  Tobacco hx: current smoker  Past Medical History:  Diagnosis Date  . Anemia   . Arthritis    OA  . Cervical radiculopathy    Dr. Vertell Limber neurosurgery  . Chronic lower back pain   . Claudication of both lower extremities ([place]) 07/15/2015  . Critical lower limb ischemia ([place]) 12/19/2019  . Diabetes mellitus    takes Metformin daily  . GERD (gastroesophageal reflux disease)    takes Protonix daily  . History of blood transfusion    "related to low HgB" ((09/10/2015  . Hyperlipidemia    takes Vytorin daily  . Hypertension    takes Benazepril and Bystolic daily  . PAD (peripheral artery disease) ([place])   . Pneumonia   . Radiculopathy, cervical region 02/11/2017  . Shortness of breath dyspnea    with exertion  . Tobacco user   . Toe fracture, right 05/09/2011  . Wears glasses     Past Surgical History:  Procedure Laterality Date  .  ABDOMINAL AORTOGRAM W/LOWER EXTREMITY N/A 12/11/2017   Procedure: ABDOMINAL AORTOGRAM W/LOWER EXTREMITY;  Surgeon: Angelia Mould, MD;  Location: [place] [facility];  Service: Cardiovascular;  Laterality: N/A;  . ABDOMINAL AORTOGRAM W/LOWER EXTREMITY Bilateral 04/22/2020   Procedure: ABDOMINAL AORTOGRAM W/LOWER EXTREMITY;  Surgeon: Marty Heck, MD;  Location: [place] [facility];  Service: Cardiovascular;  Laterality: Bilateral;  . ANTERIOR CERVICAL DECOMP/DISCECTOMY FUSION  03/08/12   C6-7  . ANTERIOR CERVICAL DECOMP/DISCECTOMY FUSION  03/08/2012   Procedure: ANTERIOR CERVICAL DECOMPRESSION/DISCECTOMY FUSION 1 LEVEL/HARDWARE REMOVAL;  Surgeon: Erline Levine, MD;  Location: [place] NEURO ORS;  Service:  Neurosurgery;  Laterality: N/A;  revison of C5-7 anterior cervical decompression with fusion with Cervical Five-Thoracic One anterior cervical decompression with fusion with interbody prothesis plating and bonegraft  . BACK SURGERY  1996  . BIOPSY  12/05/2020   Procedure: BIOPSY;  Surgeon: Ladene Artist, MD;  Location: [facility] ENDOSCOPY;  Service: Endoscopy;;  . BYPASS GRAFT FEMORAL-PERONEAL Left 11/11/2016   Procedure: REDO LEFT FEMORAL-PERONEAL BYPASS WITH PROPATEN 6MM X 80CM GRAFT;  Surgeon: Angelia Mould, MD;  Location: [facility] OR;  Service: Vascular;  Laterality: Left;  . COLONOSCOPY W/ BIOPSIES AND POLYPECTOMY  08/17/2012   f/u 5 years, 4 polyps, no high grade dysplasia or malignancy, tubular adenoma, hyperplastic polyops  . COLONOSCOPY WITH PROPOFOL N/A 12/05/2020   Procedure: COLONOSCOPY WITH PROPOFOL;  Surgeon: Ladene Artist, MD;  Location: [facility] ENDOSCOPY;  Service: Endoscopy;  Laterality: N/A;  . EMBOLECTOMY Left 12/19/2019   Procedure: LEFT FEMERAL- PERONEAL THROMBECTOMY;  Surgeon: Marty Heck, MD;  Location: [place];  Service: Vascular;  Laterality: Left;  . ENDARTERECTOMY FEMORAL Right 04/24/2020   Procedure: RIGHT FEMORAL ENDARTERECTOMY;  Surgeon: Marty Heck, MD;  Location: [place];  Service: Vascular;  Laterality: Right;  . ENTEROSCOPY N/A 12/11/2015   Procedure: ENTEROSCOPY;  Surgeon: Carol Ada, MD;  Location: [facility] ENDOSCOPY;  Service: Endoscopy;  Laterality: N/A;  . ENTEROSCOPY N/A 03/29/2018   Procedure: ENTEROSCOPY;  Surgeon: Carol Ada, MD;  Location: [facility] ENDOSCOPY;  Service: Endoscopy;  Laterality: N/A;  . ESOPHAGOGASTRODUODENOSCOPY  08/17/2012   normal esophagus and GEJ, diffuse gastritis with erythema- no malignancy, reactive gastropathy  with focal intestinal metaplasia  . ESOPHAGOGASTRODUODENOSCOPY (EGD) WITH PROPOFOL N/A 12/05/2020   Procedure: ESOPHAGOGASTRODUODENOSCOPY (EGD) WITH PROPOFOL;  Surgeon: Ladene Artist, MD;  Location: [facility] ENDOSCOPY;   Service: Endoscopy;  Laterality: N/A;  . FEMORAL-POPLITEAL BYPASS GRAFT Left 01/06/2016   Procedure: Left  COMMON FEMORAL-BELOW KNEE POPLITEAL ARTERY Bypass using non-reversed translocated saphenous vein graft from left leg;  Surgeon: Mal Misty, MD;  Location: [place];  Service: Vascular;  Laterality: Left;  . GIVENS CAPSULE STUDY N/A 11/24/2015   Procedure: GIVENS CAPSULE STUDY;  Surgeon: Juanita Craver, MD;  Location: [place];  Service: Endoscopy;  Laterality: N/A;  . HOT HEMOSTASIS N/A 03/29/2018   Procedure: HOT HEMOSTASIS (ARGON PLASMA COAGULATION/BICAP);  Surgeon: Carol Ada, MD;  Location: [place];  Service: Endoscopy;  Laterality: N/A;  . INGUINAL HERNIA REPAIR  1990's   right  . INTRAOPERATIVE ARTERIOGRAM Left 01/06/2016   Procedure: INTRA OPERATIVE ARTERIOGRAM LEFT LOWER LEG;  Surgeon: Mal Misty, MD;  Location: [place];  Service: Vascular;  Laterality: Left;  . INTRAOPERATIVE ARTERIOGRAM Left 11/11/2016   Procedure: INTRA OPERATIVE ARTERIOGRAM LEFT LOWER EXTRIMITY;  Surgeon: Angelia Mould, MD;  Location: [place];  Service: Vascular;  Laterality: Left;  . IR ANGIOGRAM FOLLOW UP STUDY  12/11/2017  . IR GENERIC HISTORICAL  10/24/2016   IR ANGIOGRAM FOLLOW UP STUDY  . LOWER EXTREMITY ANGIOGRAM Bilateral 07/30/2015   Procedure: Lower Extremity Angiogram;  Surgeon: Conrad Tombstone, MD;  Location: [place] CV LAB;  Service: Cardiovascular;  Laterality: Bilateral;  . [place]   "lower"  . PATCH ANGIOPLASTY Left 12/19/2019   Procedure: LEFT FEMORAL -PERONEAL PATCH ANGIOPLASTY WITH XENOSURE BIOLOGIC PATCH  ;  Surgeon: Marty Heck, MD;  Location: [place];  Service: Vascular;  Laterality: Left;  . PATCH ANGIOPLASTY Right 04/24/2020   Procedure: Patch Angioplasty of Right Femoral Artery using Long Xenosure Biologic Patch 1cm x 14 cm;  Surgeon: Marty Heck, MD;  Location: [facility] OR;  Service: Vascular;  Laterality: Right;  . PERIPHERAL VASCULAR  CATHETERIZATION N/A 07/30/2015   Procedure: Abdominal Aortogram;  Surgeon: Conrad Rudd, MD;  Location: [place] CV LAB;  Service: Cardiovascular;  Laterality: N/A;  . PERIPHERAL VASCULAR CATHETERIZATION N/A 10/24/2016   Procedure: Abdominal Aortogram;  Surgeon: Angelia Mould, MD;  Location: [place] CV LAB;  Service: Cardiovascular;  Laterality: N/A;  . PERIPHERAL VASCULAR CATHETERIZATION N/A 10/24/2016   Procedure: Lower Extremity Angiography;  Surgeon: Angelia Mould, MD;  Location: [place] CV LAB;  Service: Cardiovascular;  Laterality: N/A;  . PERIPHERAL VASCULAR INTERVENTION Right 12/11/2017   Procedure: PERIPHERAL VASCULAR INTERVENTION;  Surgeon: Angelia Mould, MD;  Location: [place] CV LAB;  Service: Cardiovascular;  Laterality: Right;  Marland Kitchen VASCULAR SURGERY  ~ 2007   Stent SFA   . VEIN HARVEST Left 01/06/2016   Procedure: LEFT GREATER SAPPHENOUS VEIN HARVEST;  Surgeon: Mal Misty, MD;  Location: [facility] OR;  Service: Vascular;  Laterality: Left;    Social History   Socioeconomic History  . Marital status: Significant Other    Spouse name: Not on file  . Number of children: 3  . Years of education: 73  . Highest education level: Some college, no degree  Occupational History  . Not on file  Tobacco Use  . Smoking status: Current Some Day Smoker    Years: 24.00    Types: Cigars  . Smokeless tobacco: Never Used  Vaping Use  . Vaping Use: Never used  Substance and Sexual Activity  . Alcohol use: Yes    Alcohol/week: 2.0 standard drinks    Types: 2 Cans of beer per week  . Drug use: No  . Sexual activity: Yes  Other Topics Concern  . Not on file  Social History Narrative   Lives alone. Lives in an apartment. Ground floor apartment. Smoke alarms present, no grab bars.    Has a shih-tzu, named Missy.    Patient eats a variety of foods. Drinks tea, Kool-aid, coffee      Use public transportation or brother will take places.   Some food  insecurity.      Social Determinants of Health   Financial Resource Strain: Not on file  Food Insecurity: Not on file  Transportation Needs: Not on file  Physical Activity: Not on file  Stress: Not on file  Social Connections: Not on file  Intimate Partner Violence: Not on file    Family History  Problem Relation Age of Onset  . Cancer Mother        colon Cancer  . Hyperlipidemia Mother   . Diabetes Mother   . Heart disease Father   . Hypertension Father   . Cancer Sister        Uterine  . Diabetes Sister   .  Diabetes Brother     Current Outpatient Medications  Medication Sig Dispense Refill  . acetaminophen (TYLENOL) 325 MG tablet Take 650 mg by mouth daily.    [place] Kitchen atorvastatin (LIPITOR) 40 MG tablet TAKE 1 TABLET BY MOUTH ONCE DAILY (Patient taking differently: Take 40 mg by mouth at bedtime.) 90 tablet 2  . benazepril (LOTENSIN) 5 MG tablet TAKE 1 TABLET BY MOUTH DAILY 90 tablet 0  . cilostazol (PLETAL) 100 MG tablet TAKE 1 TABLET BY MOUTH TWICE A DAY 60 tablet 1  . diclofenac Sodium (VOLTAREN) 1 % GEL Apply 2 g topically 4 (four) times daily. (Patient not taking: No sig reported) 50 g 1  . ezetimibe (ZETIA) 10 MG tablet TAKE 1 TABLET BY MOUTH ONCE DAILY (Patient taking differently: Take 10 mg by mouth daily.) 90 tablet 1  . FLUoxetine (PROZAC) 10 MG capsule Take 1 capsule (10 mg total) by mouth daily. 90 capsule 1  . gabapentin (NEURONTIN) 300 MG capsule TAKE ONE (1) CAPSULE BY MOUTH EVERY NIGHT AT BEDTIME AS NEEDED (Patient taking differently: Take 300 mg by mouth at bedtime.) 90 capsule 1  . metFORMIN (GLUCOPHAGE) 1000 MG tablet TAKE ONE (1) TABLET BY MOUTH TWICE DAILY WITH MEALS 90 tablet 1  . Nebivolol HCl 20 MG TABS TAKE 1 TABLET BY MOUTH DAILY 90 tablet 0  . pantoprazole (PROTONIX) 40 MG tablet Take 1 tablet (40 mg total) by mouth daily as needed. 90 tablet 0  . traMADol (ULTRAM) 50 MG tablet Take 1 tablet (50 mg total) by mouth every 6 (six) hours as needed for up to  7 days for moderate pain. 21 tablet 0   No current facility-administered medications for this visit.    Allergies  Allergen Reactions  . Glipizide Other (See Comments)    REACTION IS SIDE EFFECT Severe hypoglycemia to 40s.      REVIEW OF SYSTEMS:  [X]  denotes positive finding, [ ]  denotes negative finding Cardiac  Comments:  Chest pain or chest pressure:    Shortness of breath upon exertion:    Short of breath when lying flat:    Irregular heart rhythm:        Vascular    Pain in calf, thigh, or hip brought on by ambulation:    Pain in feet at night that wakes you up from your sleep:     Blood clot in your veins:    Leg swelling:         Pulmonary    Oxygen at home:    Productive cough:     Wheezing:         Neurologic    Sudden weakness in arms or legs:     Sudden numbness in arms or legs:     Sudden onset of difficulty speaking or slurred speech:    Temporary loss of vision in one eye:     Problems with dizziness:         Gastrointestinal    Blood in stool:     Vomited blood:         Genitourinary    Burning when urinating:     Blood in urine:        Psychiatric    Major depression:         Hematologic    Bleeding problems:    Problems with blood clotting too easily:        Skin    Rashes or ulcers:        Constitutional  Fever or chills:      PHYSICAL EXAMINATION:  Vitals:   02/16/21 1108  BP: 130/80  Pulse: 81  Resp: 16  Temp: 97.9 F (36.6 C)  SpO2: 100%  Weight: 170 lb (77.1 kg)  Height: 5\' 6"  (1.676 m)    General:  WDWN in NAD; vital signs documented above Gait: Normal HENT: WNL, normocephalic Pulmonary: normal non-labored breathing , without  wheezing Cardiac: regular HR, without  Murmurs without carotid bruit Abdomen: soft, NT, no masses Vascular Exam/Pulses:  Right Left  Radial 2+ (normal) 2+ (normal)  Femoral 2+ (normal) 2+ (normal)  Popliteal Not palpable Not palpable  DP 2+ (normal) Not palpable  PT Not palpable Not  palpable   Extremities: with ischemic changes of left forefoot and toes, with dry Gangrene of distal 3rd toe , without cellulitis; without open wounds;    Musculoskeletal: no muscle wasting or atrophy  Neurologic: A&O X 3;  No focal weakness or paresthesias are detected Psychiatric:  The pt has Normal affect.   Non-Invasive Vascular Imaging:   Abdominal Aorta Findings:  +-------------+-------+----------+----------+----------+--------+--------+  Location   AP (cm)Trans (cm)PSV (cm/s)Waveform ThrombusComments  +-------------+-------+----------+----------+----------+--------+--------+  Distal    1.69  1.71   47                   +-------------+-------+----------+----------+----------+--------+--------+  RT CIA Prox 113.0           biphasic           +-------------+-------+----------+----------+----------+--------+--------+  RT EIA Distal301.0           triphasic           +-------------+-------+----------+----------+----------+--------+--------+  LT CIA Prox 169.0                          +-------------+-------+----------+----------+----------+--------+--------+  LT CIA Mid  202.0           biphasic           +-------------+-------+----------+----------+----------+--------+--------+  LT CIA Distal505.0           monophasic          +-------------+-------+----------+----------+----------+--------+--------+  LT EIA Prox 76.4            monophasic          +-------------+-------+----------+----------+----------+--------+--------+  LT EIA Mid  81.2            monophasic          +-------------+-------+----------+----------+----------+--------+--------+  LT EIA Distal97.7            monophasic           +-------------+-------+----------+----------+----------+--------+--------+    Left Graft #1:  +--------------------+--------+--------+----------+--------+            PSV cm/sStenosisWaveform Comments  +--------------------+--------+--------+----------+--------+  Inflow       97       monophasic      +--------------------+--------+--------+----------+--------+  Proximal Anastomosis0    occluded           +--------------------+--------+--------+----------+--------+  Proximal Graft   0    occluded           +--------------------+--------+--------+----------+--------+  Mid Graft      0    occluded           +--------------------+--------+--------+----------+--------+  Distal Graft                       +--------------------+--------+--------+----------+--------+  Distal Anastomosis                    +--------------------+--------+--------+----------+--------+  Outflow                          +--------------------+--------+--------+----------+--------+    +-------+-----------+-----------+------------+------------+  ABI/TBIToday's ABIToday's TBIPrevious ABIPrevious TBI  +-------+-----------+-----------+------------+------------+  Right 0.64    0.54    0.60    0.13      +-------+-----------+-----------+------------+------------+  Left  0.16    0     0.87    0.60      +-------+-----------+-----------+------------+------------+    ASSESSMENT/PLAN:: 70 y.o. male here for follow up for peripheral artery disease. He presents with a 4 week history of rest pain and worsening ulceration of left 4th 3rd toe with dry gangrene. He had duplex evaluation of his left lower extremity bypass, which shows that the left femoral to peroneal bypass is occluded. Left ABI and TBI  decreased significantly from 0.87/0.60 to 0.16/0.00. His toe pressure is zero on the left. He has known very small peroneal artery. Discussed likelihood of minimal success of trying to thrombectomize or redo his bypass for the 3rd time. - I discussed this with Dr. Stanford Breed and he agreed that patient has likely exhausted all revascularization options in his left leg. We have offered the patient a left above knee amputation for most definitve healing option. Patient understands and is agreeable to proceed - I have scheduled him for a Left above knee amputation on Friday February 25th with Dr. Susanne Borders, PA-C [facility] [phone]  Clinic MD:   Dr. Stanford Breed

## 2021-02-16 NOTE — H&P (View-Only) (Signed)
Office Note     CC:  follow up Requesting Provider:  Richarda Osmond, DO  HPI: [name] is a 70 y.o. (08/01/1951) male who presents for follow up of peripheral artery disease. He has extensive vascular surgery history including most recently a right femoral endarterectomy and profundoplasty with bovine pericardial patch on 04/24/2020 by Dr. Carlis Abbott for critical limb ischemia with rest pain. On initial postop was noted to have a small open sinus in his right groin with no drainage and this ultimately healed. He also has had a thrombectomy of his left femoral to peroneal bypass, thrombectomy of the left peroneal artery and bovine pericardial patch angioplasty of the distal anastomosis on 12/19/19 by Dr. Carlis Abbott.  The left femoral to peroneal bypass was initially placed by Dr. Kellie Simmering in January of 2017 and was redone by Dr. Scot Dock in November of 2017. He has also had a previous right EIA stent with 7 mm x 29 mm VBX in 2018 by Dr. Scot Dock  At time of his last visit in September he saw Dr. Carlis Abbott. He was overall doing well with minimal claudication symptoms of bilateral lower extremities, right greater than left. He felt the pain at the time was different from the rest pain he had prior to his femoral endarterectomy. He had a duplex that showed elevated velocities in the right external iliac artery but at the time he was not interested in having any procedures due to COVID. He was schedule for a 3 month follow up with non invasive studies. He did not show up for his 3 month follow up in December of 2021  Today he presents with very significant rest pain in the left foot. He says approximately 4 weeks ago now the pain began suddenly.  Since then the left foot has been very cold and he has had progressive ulceration with blackening of his left 3rd toe. He says he is unable to sleep at night and has been taking tylenol to take edge off of pain. He can barely bear any weight on the left foot. He was seen at  his PCP office on 2/18 and they gave him some tramadol but he has completed those as well due to significant pain. He denies any pain in the right lower extremity.  The pt is on a statin for cholesterol management. Also takes Zetia The pt is not on a daily aspirin.   Other AC:  none The pt is on ACE for hypertension.   The pt is diabetic.  Tobacco hx: current smoker  Past Medical History:  Diagnosis Date  . Anemia   . Arthritis    OA  . Cervical radiculopathy    Dr. Vertell Limber neurosurgery  . Chronic lower back pain   . Claudication of both lower extremities ([place]) 07/15/2015  . Critical lower limb ischemia ([place]) 12/19/2019  . Diabetes mellitus    takes Metformin daily  . GERD (gastroesophageal reflux disease)    takes Protonix daily  . History of blood transfusion    "related to low HgB" ((09/10/2015  . Hyperlipidemia    takes Vytorin daily  . Hypertension    takes Benazepril and Bystolic daily  . PAD (peripheral artery disease) ([place])   . Pneumonia   . Radiculopathy, cervical region 02/11/2017  . Shortness of breath dyspnea    with exertion  . Tobacco user   . Toe fracture, right 05/09/2011  . Wears glasses     Past Surgical History:  Procedure Laterality Date  .  ABDOMINAL AORTOGRAM W/LOWER EXTREMITY N/A 12/11/2017   Procedure: ABDOMINAL AORTOGRAM W/LOWER EXTREMITY;  Surgeon: Angelia Mould, MD;  Location: [place] [facility];  Service: Cardiovascular;  Laterality: N/A;  . ABDOMINAL AORTOGRAM W/LOWER EXTREMITY Bilateral 04/22/2020   Procedure: ABDOMINAL AORTOGRAM W/LOWER EXTREMITY;  Surgeon: Marty Heck, MD;  Location: [place] [facility];  Service: Cardiovascular;  Laterality: Bilateral;  . ANTERIOR CERVICAL DECOMP/DISCECTOMY FUSION  03/08/12   C6-7  . ANTERIOR CERVICAL DECOMP/DISCECTOMY FUSION  03/08/2012   Procedure: ANTERIOR CERVICAL DECOMPRESSION/DISCECTOMY FUSION 1 LEVEL/HARDWARE REMOVAL;  Surgeon: Erline Levine, MD;  Location: [place] [facility] ORS;  Service:  Neurosurgery;  Laterality: N/A;  revison of C5-7 anterior cervical decompression with fusion with Cervical Five-Thoracic One anterior cervical decompression with fusion with interbody prothesis plating and bonegraft  . BACK SURGERY  1996  . BIOPSY  12/05/2020   Procedure: BIOPSY;  Surgeon: Ladene Artist, MD;  Location: [facility] ENDOSCOPY;  Service: Endoscopy;;  . BYPASS GRAFT FEMORAL-PERONEAL Left 11/11/2016   Procedure: REDO LEFT FEMORAL-PERONEAL BYPASS WITH PROPATEN 6MM X 80CM GRAFT;  Surgeon: Angelia Mould, MD;  Location: [facility] OR;  Service: Vascular;  Laterality: Left;  . COLONOSCOPY W/ BIOPSIES AND POLYPECTOMY  08/17/2012   f/u 5 years, 4 polyps, no high grade dysplasia or malignancy, tubular adenoma, hyperplastic polyops  . COLONOSCOPY WITH PROPOFOL N/A 12/05/2020   Procedure: COLONOSCOPY WITH PROPOFOL;  Surgeon: Ladene Artist, MD;  Location: [facility] ENDOSCOPY;  Service: Endoscopy;  Laterality: N/A;  . EMBOLECTOMY Left 12/19/2019   Procedure: LEFT FEMERAL- PERONEAL THROMBECTOMY;  Surgeon: Marty Heck, MD;  Location: [place];  Service: Vascular;  Laterality: Left;  . ENDARTERECTOMY FEMORAL Right 04/24/2020   Procedure: RIGHT FEMORAL ENDARTERECTOMY;  Surgeon: Marty Heck, MD;  Location: [place];  Service: Vascular;  Laterality: Right;  . ENTEROSCOPY N/A 12/11/2015   Procedure: ENTEROSCOPY;  Surgeon: Carol Ada, MD;  Location: [facility] ENDOSCOPY;  Service: Endoscopy;  Laterality: N/A;  . ENTEROSCOPY N/A 03/29/2018   Procedure: ENTEROSCOPY;  Surgeon: Carol Ada, MD;  Location: [facility] ENDOSCOPY;  Service: Endoscopy;  Laterality: N/A;  . ESOPHAGOGASTRODUODENOSCOPY  08/17/2012   normal esophagus and GEJ, diffuse gastritis with erythema- no malignancy, reactive gastropathy  with focal intestinal metaplasia  . ESOPHAGOGASTRODUODENOSCOPY (EGD) WITH PROPOFOL N/A 12/05/2020   Procedure: ESOPHAGOGASTRODUODENOSCOPY (EGD) WITH PROPOFOL;  Surgeon: Ladene Artist, MD;  Location: [facility] ENDOSCOPY;   Service: Endoscopy;  Laterality: N/A;  . FEMORAL-POPLITEAL BYPASS GRAFT Left 01/06/2016   Procedure: Left  COMMON FEMORAL-BELOW KNEE POPLITEAL ARTERY Bypass using non-reversed translocated saphenous vein graft from left leg;  Surgeon: Mal Misty, MD;  Location: [place];  Service: Vascular;  Laterality: Left;  . GIVENS CAPSULE STUDY N/A 11/24/2015   Procedure: GIVENS CAPSULE STUDY;  Surgeon: Juanita Craver, MD;  Location: [place];  Service: Endoscopy;  Laterality: N/A;  . HOT HEMOSTASIS N/A 03/29/2018   Procedure: HOT HEMOSTASIS (ARGON PLASMA COAGULATION/BICAP);  Surgeon: Carol Ada, MD;  Location: [place];  Service: Endoscopy;  Laterality: N/A;  . INGUINAL HERNIA REPAIR  1990's   right  . INTRAOPERATIVE ARTERIOGRAM Left 01/06/2016   Procedure: INTRA OPERATIVE ARTERIOGRAM LEFT LOWER LEG;  Surgeon: Mal Misty, MD;  Location: [place];  Service: Vascular;  Laterality: Left;  . INTRAOPERATIVE ARTERIOGRAM Left 11/11/2016   Procedure: INTRA OPERATIVE ARTERIOGRAM LEFT LOWER EXTRIMITY;  Surgeon: Angelia Mould, MD;  Location: [place];  Service: Vascular;  Laterality: Left;  . IR ANGIOGRAM FOLLOW UP STUDY  12/11/2017  . IR GENERIC HISTORICAL  10/24/2016   IR ANGIOGRAM FOLLOW UP STUDY  . LOWER EXTREMITY ANGIOGRAM Bilateral 07/30/2015   Procedure: Lower Extremity Angiogram;  Surgeon: Conrad Windom, MD;  Location: [place] CV LAB;  Service: Cardiovascular;  Laterality: Bilateral;  . [place]   "lower"  . PATCH ANGIOPLASTY Left 12/19/2019   Procedure: LEFT FEMORAL -PERONEAL PATCH ANGIOPLASTY WITH XENOSURE BIOLOGIC PATCH  ;  Surgeon: Marty Heck, MD;  Location: [place];  Service: Vascular;  Laterality: Left;  . PATCH ANGIOPLASTY Right 04/24/2020   Procedure: Patch Angioplasty of Right Femoral Artery using Long Xenosure Biologic Patch 1cm x 14 cm;  Surgeon: Marty Heck, MD;  Location: [facility];  Service: Vascular;  Laterality: Right;  . PERIPHERAL VASCULAR  CATHETERIZATION N/A 07/30/2015   Procedure: Abdominal Aortogram;  Surgeon: Conrad Lincolnia, MD;  Location: [place] CV LAB;  Service: Cardiovascular;  Laterality: N/A;  . PERIPHERAL VASCULAR CATHETERIZATION N/A 10/24/2016   Procedure: Abdominal Aortogram;  Surgeon: Angelia Mould, MD;  Location: [place] CV LAB;  Service: Cardiovascular;  Laterality: N/A;  . PERIPHERAL VASCULAR CATHETERIZATION N/A 10/24/2016   Procedure: Lower Extremity Angiography;  Surgeon: Angelia Mould, MD;  Location: [place] [facility] LAB;  Service: Cardiovascular;  Laterality: N/A;  . PERIPHERAL VASCULAR INTERVENTION Right 12/11/2017   Procedure: PERIPHERAL VASCULAR INTERVENTION;  Surgeon: Angelia Mould, MD;  Location: [place] CV LAB;  Service: Cardiovascular;  Laterality: Right;  Marland Kitchen VASCULAR SURGERY  ~ 2007   Stent SFA   . VEIN HARVEST Left 01/06/2016   Procedure: LEFT GREATER SAPPHENOUS VEIN HARVEST;  Surgeon: Mal Misty, MD;  Location: [facility] OR;  Service: Vascular;  Laterality: Left;    Social History   Socioeconomic History  . Marital status: Significant Other    Spouse name: Not on file  . Number of children: 3  . Years of education: 13  . Highest education level: Some college, no degree  Occupational History  . Not on file  Tobacco Use  . Smoking status: Current Some Day Smoker    Years: 24.00    Types: Cigars  . Smokeless tobacco: Never Used  Vaping Use  . Vaping Use: Never used  Substance and Sexual Activity  . Alcohol use: Yes    Alcohol/week: 2.0 standard drinks    Types: 2 Cans of beer per week  . Drug use: No  . Sexual activity: Yes  Other Topics Concern  . Not on file  Social History Narrative   Lives alone. Lives in an apartment. Ground floor apartment. Smoke alarms present, no grab bars.    Has a shih-tzu, named Missy.    Patient eats a variety of foods. Drinks tea, Kool-aid, coffee      Use public transportation or brother will take places.   Some food  insecurity.      Social Determinants of Health   Financial Resource Strain: Not on file  Food Insecurity: Not on file  Transportation Needs: Not on file  Physical Activity: Not on file  Stress: Not on file  Social Connections: Not on file  Intimate Partner Violence: Not on file    Family History  Problem Relation Age of Onset  . Cancer Mother        colon Cancer  . Hyperlipidemia Mother   . Diabetes Mother   . Heart disease Father   . Hypertension Father   . Cancer Sister        Uterine  . Diabetes Sister   .  Diabetes Brother     Current Outpatient Medications  Medication Sig Dispense Refill  . acetaminophen (TYLENOL) 325 MG tablet Take 650 mg by mouth daily.    Marland Kitchen atorvastatin (LIPITOR) 40 MG tablet TAKE 1 TABLET BY MOUTH ONCE DAILY (Patient taking differently: Take 40 mg by mouth at bedtime.) 90 tablet 2  . benazepril (LOTENSIN) 5 MG tablet TAKE 1 TABLET BY MOUTH DAILY 90 tablet 0  . cilostazol (PLETAL) 100 MG tablet TAKE 1 TABLET BY MOUTH TWICE A DAY 60 tablet 1  . diclofenac Sodium (VOLTAREN) 1 % GEL Apply 2 g topically 4 (four) times daily. (Patient not taking: No sig reported) 50 g 1  . ezetimibe (ZETIA) 10 MG tablet TAKE 1 TABLET BY MOUTH ONCE DAILY (Patient taking differently: Take 10 mg by mouth daily.) 90 tablet 1  . FLUoxetine (PROZAC) 10 MG capsule Take 1 capsule (10 mg total) by mouth daily. 90 capsule 1  . gabapentin (NEURONTIN) 300 MG capsule TAKE ONE (1) CAPSULE BY MOUTH EVERY NIGHT AT BEDTIME AS NEEDED (Patient taking differently: Take 300 mg by mouth at bedtime.) 90 capsule 1  . metFORMIN (GLUCOPHAGE) 1000 MG tablet TAKE ONE (1) TABLET BY MOUTH TWICE DAILY WITH MEALS 90 tablet 1  . Nebivolol HCl 20 MG TABS TAKE 1 TABLET BY MOUTH DAILY 90 tablet 0  . pantoprazole (PROTONIX) 40 MG tablet Take 1 tablet (40 mg total) by mouth daily as needed. 90 tablet 0  . traMADol (ULTRAM) 50 MG tablet Take 1 tablet (50 mg total) by mouth every 6 (six) hours as needed for up to  7 days for moderate pain. 21 tablet 0   No current facility-administered medications for this visit.    Allergies  Allergen Reactions  . Glipizide Other (See Comments)    REACTION IS SIDE EFFECT Severe hypoglycemia to 40s.      REVIEW OF SYSTEMS:  [X]  denotes positive finding, [ ]  denotes negative finding Cardiac  Comments:  Chest pain or chest pressure:    Shortness of breath upon exertion:    Short of breath when lying flat:    Irregular heart rhythm:        Vascular    Pain in calf, thigh, or hip brought on by ambulation:    Pain in feet at night that wakes you up from your sleep:     Blood clot in your veins:    Leg swelling:         Pulmonary    Oxygen at home:    Productive cough:     Wheezing:         Neurologic    Sudden weakness in arms or legs:     Sudden numbness in arms or legs:     Sudden onset of difficulty speaking or slurred speech:    Temporary loss of vision in one eye:     Problems with dizziness:         Gastrointestinal    Blood in stool:     Vomited blood:         Genitourinary    Burning when urinating:     Blood in urine:        Psychiatric    Major depression:         Hematologic    Bleeding problems:    Problems with blood clotting too easily:        Skin    Rashes or ulcers:        Constitutional  Fever or chills:      PHYSICAL EXAMINATION:  Vitals:   02/16/21 1108  BP: 130/80  Pulse: 81  Resp: 16  Temp: 97.9 F (36.6 C)  SpO2: 100%  Weight: 170 lb (77.1 kg)  Height: 5\' 6"  (1.676 m)    General:  WDWN in NAD; vital signs documented above Gait: Normal HENT: WNL, normocephalic Pulmonary: normal non-labored breathing , without  wheezing Cardiac: regular HR, without  Murmurs without carotid bruit Abdomen: soft, NT, no masses Vascular Exam/Pulses:  Right Left  Radial 2+ (normal) 2+ (normal)  Femoral 2+ (normal) 2+ (normal)  Popliteal Not palpable Not palpable  DP 2+ (normal) Not palpable  PT Not palpable Not  palpable   Extremities: with ischemic changes of left forefoot and toes, with dry Gangrene of distal 3rd toe , without cellulitis; without open wounds;    Musculoskeletal: no muscle wasting or atrophy  Neurologic: A&O X 3;  No focal weakness or paresthesias are detected Psychiatric:  The pt has Normal affect.   Non-Invasive Vascular Imaging:   Abdominal Aorta Findings:  +-------------+-------+----------+----------+----------+--------+--------+  Location   AP (cm)Trans (cm)PSV (cm/s)Waveform ThrombusComments  +-------------+-------+----------+----------+----------+--------+--------+  Distal    1.69  1.71   47                   +-------------+-------+----------+----------+----------+--------+--------+  RT CIA Prox 113.0           biphasic           +-------------+-------+----------+----------+----------+--------+--------+  RT EIA Distal301.0           triphasic           +-------------+-------+----------+----------+----------+--------+--------+  LT CIA Prox 169.0                          +-------------+-------+----------+----------+----------+--------+--------+  LT CIA Mid  202.0           biphasic           +-------------+-------+----------+----------+----------+--------+--------+  LT CIA Distal505.0           monophasic          +-------------+-------+----------+----------+----------+--------+--------+  LT EIA Prox 76.4            monophasic          +-------------+-------+----------+----------+----------+--------+--------+  LT EIA Mid  81.2            monophasic          +-------------+-------+----------+----------+----------+--------+--------+  LT EIA Distal97.7            monophasic           +-------------+-------+----------+----------+----------+--------+--------+    Left Graft #1:  +--------------------+--------+--------+----------+--------+            PSV cm/sStenosisWaveform Comments  +--------------------+--------+--------+----------+--------+  Inflow       97       monophasic      +--------------------+--------+--------+----------+--------+  Proximal Anastomosis0    occluded           +--------------------+--------+--------+----------+--------+  Proximal Graft   0    occluded           +--------------------+--------+--------+----------+--------+  Mid Graft      0    occluded           +--------------------+--------+--------+----------+--------+  Distal Graft                       +--------------------+--------+--------+----------+--------+  Distal Anastomosis                    +--------------------+--------+--------+----------+--------+  Outflow                          +--------------------+--------+--------+----------+--------+    +-------+-----------+-----------+------------+------------+  ABI/TBIToday's ABIToday's TBIPrevious ABIPrevious TBI  +-------+-----------+-----------+------------+------------+  Right 0.64    0.54    0.60    0.13      +-------+-----------+-----------+------------+------------+  Left  0.16    0     0.87    0.60      +-------+-----------+-----------+------------+------------+    ASSESSMENT/PLAN:: 70 y.o. male here for follow up for peripheral artery disease. He presents with a 4 week history of rest pain and worsening ulceration of left 4th 3rd toe with dry gangrene. He had duplex evaluation of his left lower extremity bypass, which shows that the left femoral to peroneal bypass is occluded. Left ABI and TBI  decreased significantly from 0.87/0.60 to 0.16/0.00. His toe pressure is zero on the left. He has known very small peroneal artery. Discussed likelihood of minimal success of trying to thrombectomize or redo his bypass for the 3rd time. - I discussed this with Dr. Stanford Breed and he agreed that patient has likely exhausted all revascularization options in his left leg. We have offered the patient a left above knee amputation for most definitve healing option. Patient understands and is agreeable to proceed - I have scheduled him for a Left above knee amputation on Friday February 25th with Dr. Susanne Borders, PA-C [facility] [phone]  Clinic MD:   Dr. Stanford Breed

## 2021-02-17 ENCOUNTER — Other Ambulatory Visit (HOSPITAL_COMMUNITY)
Admission: RE | Admit: 2021-02-17 | Discharge: 2021-02-17 | Disposition: A | Discharge status: Home / Self Care | Payer: Medicare HMO | Source: Ambulatory Visit | Attending: Vascular Surgery | Admitting: Vascular Surgery

## 2021-02-17 DIAGNOSIS — Z20822 Contact with and (suspected) exposure to covid-19: Secondary | ICD-10-CM | POA: Insufficient documentation

## 2021-02-17 DIAGNOSIS — Z01812 Encounter for preprocedural laboratory examination: Secondary | ICD-10-CM | POA: Insufficient documentation

## 2021-02-17 LAB — SARS CORONAVIRUS 2 (TAT 6-24 HRS): SARS Coronavirus 2: NEGATIVE

## 2021-02-18 ENCOUNTER — Encounter (HOSPITAL_COMMUNITY): Payer: Self-pay | Admitting: *Deleted

## 2021-02-18 NOTE — Progress Notes (Signed)
Spoke with pt for pre-op call. Pt denies cardiac history. Pt is treated for HTN and Type 2 Diabetes. Pt states his PCP, Dr. Ouida Sills instructed him to hold his Metformin when he saw her this past week. Pt states his fasting blood sugar is usually between 138-140. Last A1C was 5.6 on 11/30/20. Instructed pt to check his blood sugar in the morning when he gets up and every 2 hours until he leaves for the hospital. If blood sugar is 70 or below, treat with 1/2 cup of clear juice (apple or cranberry) and recheck blood sugar 15 minutes after drinking juice. If blood sugar continues to be 70 or below, call the Short Stay department and ask to speak to a nurse. Pt voiced understanding.  Covid test done 02/17/21 and it's negative. Pt states he's been in quarantine since the test was done and understands that he stays in quarantine until he comes to the hospital tomorrow.

## 2021-02-19 ENCOUNTER — Encounter (HOSPITAL_COMMUNITY): Payer: Self-pay

## 2021-02-19 ENCOUNTER — Inpatient Hospital Stay (HOSPITAL_COMMUNITY): Payer: Medicare HMO | Admitting: Certified Registered"

## 2021-02-19 ENCOUNTER — Inpatient Hospital Stay (HOSPITAL_COMMUNITY)
Admission: RE | Admit: 2021-02-19 | Discharge: 2021-02-22 | DRG: 240 | Disposition: A | Discharge status: Home Health | Payer: Medicare HMO | Attending: Family Medicine | Admitting: Family Medicine

## 2021-02-19 ENCOUNTER — Encounter (HOSPITAL_COMMUNITY)
Admission: RE | Disposition: A | Discharge status: Home Health | Payer: Self-pay | Source: Home / Self Care | Attending: Family Medicine

## 2021-02-19 ENCOUNTER — Other Ambulatory Visit: Payer: Self-pay

## 2021-02-19 DIAGNOSIS — E1152 Type 2 diabetes mellitus with diabetic peripheral angiopathy with gangrene: Principal | ICD-10-CM | POA: Diagnosis present

## 2021-02-19 DIAGNOSIS — M86152 Other acute osteomyelitis, left femur: Secondary | ICD-10-CM | POA: Diagnosis not present

## 2021-02-19 DIAGNOSIS — E1142 Type 2 diabetes mellitus with diabetic polyneuropathy: Secondary | ICD-10-CM | POA: Diagnosis not present

## 2021-02-19 DIAGNOSIS — Z95820 Peripheral vascular angioplasty status with implants and grafts: Secondary | ICD-10-CM | POA: Diagnosis not present

## 2021-02-19 DIAGNOSIS — E785 Hyperlipidemia, unspecified: Secondary | ICD-10-CM | POA: Diagnosis present

## 2021-02-19 DIAGNOSIS — M8618 Other acute osteomyelitis, other site: Secondary | ICD-10-CM | POA: Diagnosis present

## 2021-02-19 DIAGNOSIS — E1169 Type 2 diabetes mellitus with other specified complication: Secondary | ICD-10-CM | POA: Diagnosis present

## 2021-02-19 DIAGNOSIS — I70362 Atherosclerosis of unspecified type of bypass graft(s) of the extremities with gangrene, left leg: Secondary | ICD-10-CM | POA: Diagnosis not present

## 2021-02-19 DIAGNOSIS — I998 Other disorder of circulatory system: Secondary | ICD-10-CM | POA: Diagnosis not present

## 2021-02-19 DIAGNOSIS — L97529 Non-pressure chronic ulcer of other part of left foot with unspecified severity: Secondary | ICD-10-CM | POA: Diagnosis not present

## 2021-02-19 DIAGNOSIS — D509 Iron deficiency anemia, unspecified: Secondary | ICD-10-CM | POA: Diagnosis present

## 2021-02-19 DIAGNOSIS — Z888 Allergy status to other drugs, medicaments and biological substances status: Secondary | ICD-10-CM

## 2021-02-19 DIAGNOSIS — E871 Hypo-osmolality and hyponatremia: Secondary | ICD-10-CM | POA: Diagnosis not present

## 2021-02-19 DIAGNOSIS — I70209 Unspecified atherosclerosis of native arteries of extremities, unspecified extremity: Secondary | ICD-10-CM

## 2021-02-19 DIAGNOSIS — Z800 Family history of malignant neoplasm of digestive organs: Secondary | ICD-10-CM | POA: Diagnosis not present

## 2021-02-19 DIAGNOSIS — E1121 Type 2 diabetes mellitus with diabetic nephropathy: Secondary | ICD-10-CM | POA: Diagnosis not present

## 2021-02-19 DIAGNOSIS — I739 Peripheral vascular disease, unspecified: Secondary | ICD-10-CM | POA: Diagnosis not present

## 2021-02-19 DIAGNOSIS — Z833 Family history of diabetes mellitus: Secondary | ICD-10-CM

## 2021-02-19 DIAGNOSIS — Z20822 Contact with and (suspected) exposure to covid-19: Secondary | ICD-10-CM | POA: Diagnosis present

## 2021-02-19 DIAGNOSIS — K219 Gastro-esophageal reflux disease without esophagitis: Secondary | ICD-10-CM | POA: Diagnosis present

## 2021-02-19 DIAGNOSIS — E1151 Type 2 diabetes mellitus with diabetic peripheral angiopathy without gangrene: Secondary | ICD-10-CM | POA: Diagnosis present

## 2021-02-19 DIAGNOSIS — Z8249 Family history of ischemic heart disease and other diseases of the circulatory system: Secondary | ICD-10-CM | POA: Diagnosis not present

## 2021-02-19 DIAGNOSIS — Z79899 Other long term (current) drug therapy: Secondary | ICD-10-CM

## 2021-02-19 DIAGNOSIS — E1140 Type 2 diabetes mellitus with diabetic neuropathy, unspecified: Secondary | ICD-10-CM | POA: Diagnosis present

## 2021-02-19 DIAGNOSIS — Z89612 Acquired absence of left leg above knee: Secondary | ICD-10-CM | POA: Diagnosis not present

## 2021-02-19 DIAGNOSIS — I70241 Atherosclerosis of native arteries of left leg with ulceration of thigh: Secondary | ICD-10-CM | POA: Diagnosis not present

## 2021-02-19 DIAGNOSIS — Z981 Arthrodesis status: Secondary | ICD-10-CM | POA: Diagnosis not present

## 2021-02-19 DIAGNOSIS — E11621 Type 2 diabetes mellitus with foot ulcer: Secondary | ICD-10-CM | POA: Diagnosis present

## 2021-02-19 DIAGNOSIS — E876 Hypokalemia: Secondary | ICD-10-CM | POA: Diagnosis not present

## 2021-02-19 DIAGNOSIS — L97129 Non-pressure chronic ulcer of left thigh with unspecified severity: Secondary | ICD-10-CM | POA: Diagnosis not present

## 2021-02-19 DIAGNOSIS — F32A Depression, unspecified: Secondary | ICD-10-CM | POA: Diagnosis present

## 2021-02-19 DIAGNOSIS — F17290 Nicotine dependence, other tobacco product, uncomplicated: Secondary | ICD-10-CM | POA: Diagnosis present

## 2021-02-19 DIAGNOSIS — Z83438 Family history of other disorder of lipoprotein metabolism and other lipidemia: Secondary | ICD-10-CM

## 2021-02-19 DIAGNOSIS — I10 Essential (primary) hypertension: Secondary | ICD-10-CM | POA: Diagnosis not present

## 2021-02-19 DIAGNOSIS — G8929 Other chronic pain: Secondary | ICD-10-CM | POA: Diagnosis present

## 2021-02-19 DIAGNOSIS — I96 Gangrene, not elsewhere classified: Secondary | ICD-10-CM | POA: Diagnosis not present

## 2021-02-19 DIAGNOSIS — I70341 Atherosclerosis of unspecified type of bypass graft(s) of the left leg with ulceration of thigh: Secondary | ICD-10-CM | POA: Diagnosis not present

## 2021-02-19 DIAGNOSIS — I70262 Atherosclerosis of native arteries of extremities with gangrene, left leg: Secondary | ICD-10-CM | POA: Diagnosis not present

## 2021-02-19 DIAGNOSIS — E1165 Type 2 diabetes mellitus with hyperglycemia: Secondary | ICD-10-CM | POA: Diagnosis not present

## 2021-02-19 DIAGNOSIS — Z89619 Acquired absence of unspecified leg above knee: Secondary | ICD-10-CM

## 2021-02-19 HISTORY — PX: ABOVE KNEE LEG AMPUTATION: SUR20

## 2021-02-19 HISTORY — PX: AMPUTATION: SHX166

## 2021-02-19 LAB — GLUCOSE, CAPILLARY
Glucose-Capillary: 104 mg/dL — ABNORMAL HIGH (ref 70–99)
Glucose-Capillary: 97 mg/dL (ref 70–99)
Glucose-Capillary: 145 mg/dL — ABNORMAL HIGH (ref 70–99)
Glucose-Capillary: 159 mg/dL — ABNORMAL HIGH (ref 70–99)

## 2021-02-19 LAB — CBC
HCT: 39.6 % (ref 39.0–52.0)
HCT: 35.1 % — ABNORMAL LOW (ref 39.0–52.0)
Hemoglobin: 11.8 g/dL — ABNORMAL LOW (ref 13.0–17.0)
Hemoglobin: 10.7 g/dL — ABNORMAL LOW (ref 13.0–17.0)
MCH: 22.2 pg — ABNORMAL LOW (ref 26.0–34.0)
MCH: 22.2 pg — ABNORMAL LOW (ref 26.0–34.0)
MCHC: 29.8 g/dL — ABNORMAL LOW (ref 30.0–36.0)
MCHC: 30.5 g/dL (ref 30.0–36.0)
MCV: 74.4 fL — ABNORMAL LOW (ref 80.0–100.0)
MCV: 72.7 fL — ABNORMAL LOW (ref 80.0–100.0)
Platelets: 292 10*3/uL (ref 150–400)
Platelets: 269 10*3/uL (ref 150–400)
RBC: 5.32 MIL/uL (ref 4.22–5.81)
RBC: 4.83 MIL/uL (ref 4.22–5.81)
RDW: 20.4 % — ABNORMAL HIGH (ref 11.5–15.5)
RDW: 19.9 % — ABNORMAL HIGH (ref 11.5–15.5)
WBC: 6.4 10*3/uL (ref 4.0–10.5)
WBC: 8.4 10*3/uL (ref 4.0–10.5)
nRBC: 0 % (ref 0.0–0.2)
nRBC: 0 % (ref 0.0–0.2)

## 2021-02-19 LAB — COMPREHENSIVE METABOLIC PANEL
ALT: 17 U/L (ref 0–44)
AST: 20 U/L (ref 15–41)
Albumin: 3.8 g/dL (ref 3.5–5.0)
Alkaline Phosphatase: 72 U/L (ref 38–126)
Anion gap: 12 (ref 5–15)
BUN: 10 mg/dL (ref 8–23)
CO2: 25 mmol/L (ref 22–32)
Calcium: 9.5 mg/dL (ref 8.9–10.3)
Chloride: 99 mmol/L (ref 98–111)
Creatinine, Ser: 0.86 mg/dL (ref 0.61–1.24)
GFR, Estimated: >60 mL/min (ref >60)
Glucose, Bld: 106 mg/dL — ABNORMAL HIGH (ref 70–99)
Potassium: 4.4 mmol/L (ref 3.5–5.1)
Sodium: 136 mmol/L (ref 135–145)
Total Bilirubin: 0.4 mg/dL (ref 0.3–1.2)
Total Protein: 7.4 g/dL (ref 6.5–8.1)

## 2021-02-19 LAB — TYPE AND SCREEN
ABO/RH(D): A POS
Antibody Screen: NEGATIVE

## 2021-02-19 LAB — HIV ANTIBODY (ROUTINE TESTING W REFLEX): HIV Screen 4th Generation wRfx: NONREACTIVE

## 2021-02-19 SURGERY — AMPUTATION, ABOVE KNEE
Anesthesia: General | Site: Knee | Laterality: Left

## 2021-02-19 MED ORDER — NEBIVOLOL HCL 20 MG PO TABS: 1.0000 | ORAL_TABLET | Freq: Every day | ORAL | Status: DC

## 2021-02-19 MED ORDER — SODIUM CHLORIDE 0.9 % IV SOLN
INTRAVENOUS | Status: DC
Start: 2021-02-19 — End: 2021-02-22

## 2021-02-19 MED ORDER — EPHEDRINE SULFATE 50 MG/ML IJ SOLN
INTRAMUSCULAR | Status: DC | PRN
  Administered 2021-02-19 (×2): 10 mg via INTRAVENOUS

## 2021-02-19 MED ORDER — CEFAZOLIN SODIUM-DEXTROSE 2-4 GM/100ML-% IV SOLN
2.0000 g | INTRAVENOUS | Status: AC
  Administered 2021-02-19: 2 g via INTRAVENOUS
  Filled 2021-02-19: qty 100

## 2021-02-19 MED ORDER — FLUOXETINE HCL 10 MG PO CAPS: 10.0000 mg | ORAL_CAPSULE | Freq: Every day | ORAL | Status: DC

## 2021-02-19 MED ORDER — CHLORHEXIDINE GLUCONATE CLOTH 2 % EX PADS: 6.0000 | MEDICATED_PAD | Freq: Once | CUTANEOUS | Status: DC

## 2021-02-19 MED ORDER — EPHEDRINE 5 MG/ML INJ
INTRAVENOUS | Status: AC
  Filled 2021-02-19: qty 10

## 2021-02-19 MED ORDER — FENTANYL CITRATE (PF) 100 MCG/2ML IJ SOLN
25.0000 ug | INTRAMUSCULAR | Status: DC | PRN
Start: 2021-02-19 — End: 2021-02-19
  Administered 2021-02-19 (×2): 25 ug via INTRAVENOUS

## 2021-02-19 MED ORDER — ATORVASTATIN CALCIUM 40 MG PO TABS
40.0000 mg | ORAL_TABLET | Freq: Every day | ORAL | Status: DC
  Administered 2021-02-20 – 2021-02-21 (×2): 40 mg via ORAL
  Filled 2021-02-19 (×2): qty 1

## 2021-02-19 MED ORDER — NEBIVOLOL HCL 10 MG PO TABS
20.0000 mg | ORAL_TABLET | Freq: Every day | ORAL | Status: DC
  Administered 2021-02-20 – 2021-02-22 (×3): 20 mg via ORAL
  Filled 2021-02-19 (×3): qty 2

## 2021-02-19 MED ORDER — OXYCODONE-ACETAMINOPHEN 5-325 MG PO TABS
1.0000 | ORAL_TABLET | ORAL | Status: DC | PRN
  Administered 2021-02-19: 2 via ORAL
  Administered 2021-02-19: 1 via ORAL
  Administered 2021-02-20 (×2): 2 via ORAL
  Administered 2021-02-21: 1 via ORAL
  Administered 2021-02-21 – 2021-02-22 (×8): 2 via ORAL
  Filled 2021-02-19 (×6): qty 2
  Filled 2021-02-19: qty 1
  Filled 2021-02-19 (×6): qty 2

## 2021-02-19 MED ORDER — ONDANSETRON HCL 4 MG/2ML IJ SOLN
INTRAMUSCULAR | Status: AC
  Filled 2021-02-19: qty 2

## 2021-02-19 MED ORDER — CHLORHEXIDINE GLUCONATE 0.12 % MT SOLN: 15.0000 mL | Freq: Once | OROMUCOSAL | Status: AC

## 2021-02-19 MED ORDER — FENTANYL CITRATE (PF) 250 MCG/5ML IJ SOLN
INTRAMUSCULAR | Status: AC
  Filled 2021-02-19: qty 5

## 2021-02-19 MED ORDER — FENTANYL CITRATE (PF) 100 MCG/2ML IJ SOLN: 25.0000 ug | INTRAMUSCULAR | Status: DC | PRN

## 2021-02-19 MED ORDER — LIDOCAINE 2% (20 MG/ML) 5 ML SYRINGE
INTRAMUSCULAR | Status: AC
  Filled 2021-02-19: qty 5

## 2021-02-19 MED ORDER — ONDANSETRON HCL 4 MG/2ML IJ SOLN
INTRAMUSCULAR | Status: DC | PRN
  Administered 2021-02-19: 4 mg via INTRAVENOUS

## 2021-02-19 MED ORDER — BENAZEPRIL HCL 5 MG PO TABS
5.0000 mg | ORAL_TABLET | Freq: Every day | ORAL | Status: DC
  Administered 2021-02-20 – 2021-02-22 (×3): 5 mg via ORAL
  Filled 2021-02-19 (×3): qty 1

## 2021-02-19 MED ORDER — MIDAZOLAM HCL 2 MG/2ML IJ SOLN
INTRAMUSCULAR | Status: AC
  Filled 2021-02-19: qty 2

## 2021-02-19 MED ORDER — LIDOCAINE 2% (20 MG/ML) 5 ML SYRINGE
INTRAMUSCULAR | Status: DC | PRN
  Administered 2021-02-19: 100 mg via INTRAVENOUS

## 2021-02-19 MED ORDER — 0.9 % SODIUM CHLORIDE (POUR BTL) OPTIME
TOPICAL | Status: DC | PRN
  Administered 2021-02-19: 1000 mL

## 2021-02-19 MED ORDER — FENTANYL CITRATE (PF) 250 MCG/5ML IJ SOLN
INTRAMUSCULAR | Status: DC | PRN
  Administered 2021-02-19: 25 ug via INTRAVENOUS
  Administered 2021-02-19: 75 ug via INTRAVENOUS
  Administered 2021-02-19 (×2): 25 ug via INTRAVENOUS

## 2021-02-19 MED ORDER — ONDANSETRON HCL 4 MG/2ML IJ SOLN: 4.0000 mg | Freq: Four times a day (QID) | INTRAMUSCULAR | Status: DC | PRN

## 2021-02-19 MED ORDER — DEXAMETHASONE SODIUM PHOSPHATE 10 MG/ML IJ SOLN
INTRAMUSCULAR | Status: AC
  Filled 2021-02-19: qty 1

## 2021-02-19 MED ORDER — PHENYLEPHRINE 40 MCG/ML (10ML) SYRINGE FOR IV PUSH (FOR BLOOD PRESSURE SUPPORT)
PREFILLED_SYRINGE | INTRAVENOUS | Status: AC
  Filled 2021-02-19: qty 10

## 2021-02-19 MED ORDER — FENTANYL CITRATE (PF) 100 MCG/2ML IJ SOLN
INTRAMUSCULAR | Status: AC
  Filled 2021-02-19: qty 2

## 2021-02-19 MED ORDER — MORPHINE SULFATE (PF) 2 MG/ML IV SOLN
2.0000 mg | INTRAVENOUS | Status: DC | PRN
  Administered 2021-02-19 – 2021-02-22 (×6): 2 mg via INTRAVENOUS
  Filled 2021-02-19 (×6): qty 1

## 2021-02-19 MED ORDER — EZETIMIBE 10 MG PO TABS
10.0000 mg | ORAL_TABLET | Freq: Every day | ORAL | Status: DC
  Administered 2021-02-20 – 2021-02-22 (×3): 10 mg via ORAL
  Filled 2021-02-19 (×3): qty 1

## 2021-02-19 MED ORDER — MIDAZOLAM HCL 5 MG/5ML IJ SOLN
INTRAMUSCULAR | Status: DC | PRN
  Administered 2021-02-19: 1 mg via INTRAVENOUS

## 2021-02-19 MED ORDER — PANTOPRAZOLE SODIUM 40 MG PO TBEC
40.0000 mg | DELAYED_RELEASE_TABLET | Freq: Every day | ORAL | Status: DC
  Administered 2021-02-20 – 2021-02-22 (×3): 40 mg via ORAL
  Filled 2021-02-19 (×3): qty 1

## 2021-02-19 MED ORDER — ORAL CARE MOUTH RINSE: 15.0000 mL | Freq: Once | OROMUCOSAL | Status: AC

## 2021-02-19 MED ORDER — NICOTINE 7 MG/24HR TD PT24
7.0000 mg | MEDICATED_PATCH | Freq: Every day | TRANSDERMAL | Status: DC
  Administered 2021-02-19 – 2021-02-22 (×4): 7 mg via TRANSDERMAL
  Filled 2021-02-19 (×4): qty 1

## 2021-02-19 MED ORDER — PHENYLEPHRINE 40 MCG/ML (10ML) SYRINGE FOR IV PUSH (FOR BLOOD PRESSURE SUPPORT)
PREFILLED_SYRINGE | INTRAVENOUS | Status: DC | PRN
  Administered 2021-02-19: 120 ug via INTRAVENOUS
  Administered 2021-02-19: 80 ug via INTRAVENOUS

## 2021-02-19 MED ORDER — CHLORHEXIDINE GLUCONATE 0.12 % MT SOLN
OROMUCOSAL | Status: AC
  Administered 2021-02-19: 15 mL via OROMUCOSAL
  Filled 2021-02-19: qty 15

## 2021-02-19 MED ORDER — INSULIN ASPART 100 UNIT/ML ~~LOC~~ SOLN
0.0000 [IU] | Freq: Three times a day (TID) | SUBCUTANEOUS | Status: DC
  Administered 2021-02-19 – 2021-02-20 (×3): 1 [IU] via SUBCUTANEOUS
  Administered 2021-02-21 – 2021-02-22 (×2): 2 [IU] via SUBCUTANEOUS

## 2021-02-19 MED ORDER — PHENYLEPHRINE HCL-NACL 10-0.9 MG/250ML-% IV SOLN
INTRAVENOUS | Status: DC | PRN
  Administered 2021-02-19: 40 ug/min via INTRAVENOUS

## 2021-02-19 MED ORDER — DEXAMETHASONE SODIUM PHOSPHATE 10 MG/ML IJ SOLN
INTRAMUSCULAR | Status: DC | PRN
  Administered 2021-02-19: 5 mg via INTRAVENOUS

## 2021-02-19 MED ORDER — GABAPENTIN 300 MG PO CAPS
300.0000 mg | ORAL_CAPSULE | Freq: Every day | ORAL | Status: DC
  Administered 2021-02-20 – 2021-02-21 (×2): 300 mg via ORAL
  Filled 2021-02-19 (×2): qty 1

## 2021-02-19 MED ORDER — LACTATED RINGERS IV SOLN: INTRAVENOUS | Status: DC

## 2021-02-19 MED ORDER — POLYETHYLENE GLYCOL 3350 17 G PO PACK: 17.0000 g | PACK | Freq: Every day | ORAL | Status: DC | PRN

## 2021-02-19 MED ORDER — PROPOFOL 10 MG/ML IV BOLUS
INTRAVENOUS | Status: DC | PRN
  Administered 2021-02-19: 180 mg via INTRAVENOUS

## 2021-02-19 SURGICAL SUPPLY — 60 items
APL PRP STRL LF DISP 70% ISPRP (MISCELLANEOUS) ×1
BANDAGE ESMARK 6X9 LF (GAUZE/BANDAGES/DRESSINGS) ×1 IMPLANT
BLADE SAGITTAL (BLADE)
BLADE SAGITTAL 25.0X1.19X90 (BLADE) ×1 IMPLANT
BLADE SAW GIGLI 510 (BLADE) ×2 IMPLANT
BLADE SAW THK.89X75X18XSGTL (BLADE) IMPLANT
BLADE SURG 21 STRL SS (BLADE) ×2 IMPLANT
BNDG CMPR 9X6 STRL LF SNTH (GAUZE/BANDAGES/DRESSINGS) ×2
BNDG COHESIVE 6X5 TAN STRL LF (GAUZE/BANDAGES/DRESSINGS) ×2 IMPLANT
BNDG ELASTIC 4X5.8 VLCR STR LF (GAUZE/BANDAGES/DRESSINGS) ×2 IMPLANT
BNDG ELASTIC 6X5.8 VLCR STR LF (GAUZE/BANDAGES/DRESSINGS) ×2 IMPLANT
BNDG ESMARK 6X9 LF (GAUZE/BANDAGES/DRESSINGS) ×4
BNDG GAUZE ELAST 4 BULKY (GAUZE/BANDAGES/DRESSINGS) ×4 IMPLANT
CANISTER SUCT 3000ML PPV (MISCELLANEOUS) ×2 IMPLANT
CHLORAPREP W/TINT 26 (MISCELLANEOUS) ×2 IMPLANT
CLIP VESOCCLUDE MED 6/CT (CLIP) ×2 IMPLANT
COVER SURGICAL LIGHT HANDLE (MISCELLANEOUS) ×2 IMPLANT
COVER WAND RF STERILE (DRAPES) ×2 IMPLANT
CUFF TOURN SGL QUICK 24 (TOURNIQUET CUFF)
CUFF TOURN SGL QUICK 34 (TOURNIQUET CUFF)
CUFF TRNQT CYL 24X4X16.5-23 (TOURNIQUET CUFF) IMPLANT
CUFF TRNQT CYL 34X4.125X (TOURNIQUET CUFF) IMPLANT
DRAIN CHANNEL 19F RND (DRAIN) IMPLANT
DRAPE HALF SHEET 40X57 (DRAPES) ×2 IMPLANT
DRAPE ORTHO SPLIT 77X108 STRL (DRAPES) ×4
DRAPE SURG ORHT 6 SPLT 77X108 (DRAPES) ×2 IMPLANT
ELECT CAUTERY BLADE 6.4 (BLADE) ×2 IMPLANT
ELECT REM PT RETURN 9FT ADLT (ELECTROSURGICAL) ×2
ELECTRODE REM PT RTRN 9FT ADLT (ELECTROSURGICAL) ×1 IMPLANT
EVACUATOR SILICONE 100CC (DRAIN) IMPLANT
GAUZE SPONGE 4X4 12PLY STRL (GAUZE/BANDAGES/DRESSINGS) ×3 IMPLANT
GAUZE XEROFORM 5X9 LF (GAUZE/BANDAGES/DRESSINGS) ×2 IMPLANT
GLOVE SURG SS PI 8.0 STRL IVOR (GLOVE) ×2 IMPLANT
GOWN STRL REUS W/ TWL LRG LVL3 (GOWN DISPOSABLE) ×2 IMPLANT
GOWN STRL REUS W/ TWL XL LVL3 (GOWN DISPOSABLE) ×1 IMPLANT
GOWN STRL REUS W/TWL LRG LVL3 (GOWN DISPOSABLE) ×4
GOWN STRL REUS W/TWL XL LVL3 (GOWN DISPOSABLE) ×2
KIT BASIN OR (CUSTOM PROCEDURE TRAY) ×2 IMPLANT
KIT TURNOVER KIT B (KITS) ×2 IMPLANT
NS IRRIG 1000ML POUR BTL (IV SOLUTION) ×2 IMPLANT
PACK GENERAL/GYN (CUSTOM PROCEDURE TRAY) ×2 IMPLANT
PAD ARMBOARD 7.5X6 YLW CONV (MISCELLANEOUS) ×4 IMPLANT
PENCIL SMOKE EVACUATOR (MISCELLANEOUS) ×2 IMPLANT
STAPLER SKIN 35 REG (STAPLE) ×2 IMPLANT
STAPLER VISISTAT 35W (STAPLE) ×2 IMPLANT
STOCKINETTE IMPERVIOUS LG (DRAPES) ×2 IMPLANT
SUT ETHILON 2 0 PSLX (SUTURE) ×7 IMPLANT
SUT ETHILON 3 0 PS 1 (SUTURE) IMPLANT
SUT PROLENE 6 0 BV (SUTURE) ×2 IMPLANT
SUT SILK 0 TIES 10X30 (SUTURE) ×2 IMPLANT
SUT SILK 2 0 (SUTURE) ×2
SUT SILK 2 0 SH CR/8 (SUTURE) ×4 IMPLANT
SUT SILK 2-0 18XBRD TIE 12 (SUTURE) ×1 IMPLANT
SUT SILK 3 0 (SUTURE)
SUT SILK 3-0 18XBRD TIE 12 (SUTURE) IMPLANT
SUT VIC AB 2-0 CT1 18 (SUTURE) ×5 IMPLANT
TAPE UMBILICAL COTTON 1/8X30 (MISCELLANEOUS) ×2 IMPLANT
TOWEL GREEN STERILE (TOWEL DISPOSABLE) ×4 IMPLANT
UNDERPAD 30X36 HEAVY ABSORB (UNDERPADS AND DIAPERS) ×2 IMPLANT
WATER STERILE IRR 1000ML POUR (IV SOLUTION) ×2 IMPLANT

## 2021-02-19 NOTE — Interval H&P Note (Signed)
History and Physical Interval Note:  02/19/2021 8:51 AM  [name]  has presented today for surgery, with the diagnosis of PAD.  The various methods of treatment have been discussed with the patient and family. After consideration of risks, benefits and other options for treatment, the patient has consented to  Procedure(s): LEFT ABOVE KNEE AMPUTATION (Left) as a surgical intervention.  The patient's history has been reviewed, patient examined, no change in status, stable for surgery.  I have reviewed the patient's chart and labs.  Questions were answered to the patient's satisfaction.     Cherre Robins

## 2021-02-19 NOTE — Hospital Course (Addendum)
Assessment and Plan: [name] is a 70 y.o. male presenting with scheduled left AKA. PMH is significant for HTN, HLD, PAD, T2DM, IDA.  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 Noted to have four weeks of pain and worsening ulceration of left 3rd toe with dry gangrene. Duplex performed which showed left femoral to peroneal bypass was occluded.  Left AKA performed on 02/19/2021.  Patient continues to do well status post the left AKA.***  T2DM with nephropathy: Patient continue on sliding scale insulin with CBGs during hospitalization.  No complications occurred***.  HTN: Home medications include benazepril 5 mg daily and nebivolol 20 mg daily were held on the initial day of his AKA and will resent the next day with good results***.  Blood pressure continue to be appropriate through hospitalization***.   PCP follow-up task/recommendations: 1.  F.u with [facility] in 4 weeks for suture and staple removal  2.Oral iron OP   Do not take extra tylenol

## 2021-02-19 NOTE — Op Note (Addendum)
DATE OF SERVICE: 02/19/2021  PATIENT:  [name]  70 y.o. male  PRE-OPERATIVE DIAGNOSIS:  Atherosclerosis of native and bypass arteries of left lower extremity causing ulceration  POST-OPERATIVE DIAGNOSIS:  Same  PROCEDURE:   Left above knee amputation  SURGEON:  Surgeon(s) and Role:    * Cherre Robins, MD - Primary  ASSISTANT: Risa Grill, PA-C  An assistant was required to facilitate exposure and expedite the case.  ANESTHESIA:   general  EBL: 162mL  BLOOD ADMINISTERED:none  DRAINS: none   LOCAL MEDICATIONS USED:  NONE  SPECIMEN:  Residual left limb  COUNTS: confirmed correct   TOURNIQUET:    Total Tourniquet Time Documented: Thigh (Left) - 15 minutes Total: Thigh (Left) - 15 minutes  PATIENT DISPOSITION:  PACU - hemodynamically stable.   Delay start of Pharmacological VTE agent (>24hrs) due to surgical blood loss or risk of bleeding: no  INDICATION FOR PROCEDURE: WYMON SWANEY is a 70 y.o. male with non-salvageable left lower extremity ischemia. After careful discussion of risks, benefits, and alternatives the patient was offered palliative left above knee amputation. The patient understood and wished to proceed.  OPERATIVE FINDINGS: grossly healthy margins  DESCRIPTION OF PROCEDURE: After identification of the patient in the pre-operative holding area, the patient was transferred to the operating room. The patient was positioned supine on the operating room table. Anesthesia was induced. The left leg was prepped and draped in standard fashion. A surgical pause was performed confirming correct patient, procedure, and operative location.  A generous left thigh fishmouth incision was planned on the left lower extremity with a skin marker. A sterile tourniquet was applied to the proximal thigh. An esmarch tourniquet was used to exsanguinate the leg. The pneumatic tourniquet was inflated. An incision was made as planned. The incision was carried down with a #21  scalpel through the subcutaneous tissue, and through the posterior and anterior compartments of the thigh until the femur was encountered. The periosteum about the femur was elevated as high as possible. The femur was transected with a powered saw. The vascular bundle was found in its typical position, and was suture-ligated proximally using a 2-0 silk suture.  The sciatic nerve was identified in typical position, retracted, transected at the retracted margin, and ligated with a 2-0 silk suture.  The tourniquet was released. Hemostasis was achieved in the surgical bed. The wound was copiously irrigated. A myodesis was performed with 2-0 Vicryl. The wound was closed in layers using 2-0 Vicryl, 2-0 nylon, surgical stapler.  A clean bandage was applied.  Upon completion of the case instrument and sharps counts were confirmed correct. The patient was transferred to the PACU in good condition. I was present for all portions of the procedure.  Yevonne Aline. Stanford Breed, MD [facility] Phone Number: [phone] 02/19/2021 12:00 PM

## 2021-02-19 NOTE — Evaluation (Signed)
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 PMH includes extensive vascular surgery history (L femoral to peroneal bypass 12/2015; R EIA stent 2018; thrombectomy of L femoral to peroneal bypass and L peroneal artery 12/20; R femoral endarterectomy, profundoplasty with bovine pericardial patch on 03/2020), OA, DM, GERD, HLD, HTN, PAD, ACDF 2013.  Clinical Impression   Pt presents with severe L residual limb pain, phantom limb sensations, difficulty performing mobility tasks, and decreased activity tolerance. Pt to benefit from acute PT to address deficits. Pt requiring mod assist for supine<>sit this day, limited by severe muscle spasms in residual limb. Prior to admission, pt ambulatory with cane and independent with ADLs. PT recommending CIR to maximize pt independence post-amputation. PT to progress mobility as tolerated, and will continue to follow acutely.      Follow Up Recommendations CIR    Equipment Recommendations  Wheelchair (measurements PT);Wheelchair cushion (measurements PT)    Recommendations for Other Services Rehab consult     Precautions / Restrictions Precautions Precautions: Fall Restrictions Weight Bearing Restrictions: Yes LLE Weight Bearing: Non weight bearing Other Position/Activity Restrictions: L AKA      Mobility  Bed Mobility Overal bed mobility: Needs Assistance Bed Mobility: Supine to Sit;Sit to Supine     Supine to sit: Mod assist;HOB elevated Sit to supine: Mod assist;HOB elevated   General bed mobility comments: mod assist for supine<>sit for trunk and LE management, boost up in bed upon return to supine. EOB sitting x15 minutes with initially posterior support, transitioning to supervision.    Transfers                 General transfer comment: nt  Ambulation/Gait                 Stairs            Wheelchair Mobility    Modified Rankin (Stroke Patients Only)       Balance Overall balance assessment: Needs assistance;History of Falls Sitting-balance support: Bilateral upper extremity supported;Feet supported Sitting balance-Leahy Scale: Fair Sitting balance - Comments: EOB sitting x15 minutes, initially requiring min assist transitioning to supervision only with bilateral UE support Postural control: Posterior lean     Standing balance comment: not attempted on eval                             Pertinent Vitals/Pain Pain Assessment: 0-10 Pain Score: 8  Pain Location: L residual limb Pain Descriptors / Indicators: Sore;Spasm;Burning Pain Intervention(s): Limited activity within patient's tolerance;Monitored during session;Repositioned;Premedicated before session    Home Living Family/patient expects to be discharged to:: Private residence Living Arrangements: Spouse/significant other Available Help at Discharge: Family;Available 24 hours/day (fiance) Type of Home: House Home Access: Level entry     Home Layout: One level Home Equipment: Cane - single point;Bedside commode;Hand held shower head;Walker - 2 wheels      Prior Function Level of Independence: Independent with assistive device(s)         Comments: pt reports using cane for ambulation PTA, independent with ADLs. Pt had HHPT and HHRN coming to house for RLE wound dressing, mobility progression.     Hand Dominance   Dominant Hand: Right    Extremity/Trunk Assessment   Upper Extremity Assessment Upper Extremity Assessment: Defer to OT evaluation  Lower Extremity Assessment Lower Extremity Assessment: Generalized weakness;LLE deficits/detail LLE Deficits / Details: able to perform hip flexion, hip ER/IR partial ROM, hip extension LLE: Unable to fully assess due to pain    Cervical / Trunk Assessment Cervical / Trunk Assessment: Normal   Communication   Communication: No difficulties  Cognition Arousal/Alertness: Awake/alert Behavior During Therapy: WFL for tasks assessed/performed Overall Cognitive Status: Within Functional Limits for tasks assessed                                 General Comments: very pleasant, hard-working      General Comments General comments (skin integrity, edema, etc.): Pt describing phantom limb pain in L knee and foot, burning sensation especially. PT educated pt on phantom limb pain, mechanisms to re-orient brain (i.e. touching residual limb while looking at it)    Exercises General Exercises - Lower Extremity Long Arc Quad: AROM;Right;10 reps;Seated   Assessment/Plan    PT Assessment Patient needs continued PT services  PT Problem List Decreased strength;Decreased mobility;Decreased safety awareness;Decreased activity tolerance;Decreased knowledge of precautions;Decreased balance;Decreased knowledge of use of DME;Pain;Decreased skin integrity       PT Treatment Interventions DME instruction;Gait training;Therapeutic exercise;Patient/family education;Balance training;Functional mobility training;Therapeutic activities    PT Goals (Current goals can be found in the Care Plan section)  Acute Rehab PT Goals Patient Stated Goal: get back to my independence PT Goal Formulation: With patient Time For Goal Achievement: 03/05/21 Potential to Achieve Goals: Good    Frequency Min 3X/week   Barriers to discharge        Co-evaluation               AM-PAC PT "6 Clicks" Mobility  Outcome Measure Help needed turning from your back to your side while in a flat bed without using bedrails?: A Lot Help needed moving from lying on your back to sitting on the side of a flat bed without using bedrails?: A Lot Help needed moving to and from a bed to a chair (including a wheelchair)?: A Lot Help needed standing up from a chair using your arms (e.g., wheelchair or bedside  chair)?: Total Help needed to walk in hospital room?: Total Help needed climbing 3-5 steps with a railing? : Total 6 Click Score: 9    End of Session   Activity Tolerance: Patient tolerated treatment well;Patient limited by pain Patient left: in bed;with call bell/phone within reach;with bed alarm set;with family/visitor present Nurse Communication: Mobility status;Patient requests pain meds PT Visit Diagnosis: Other abnormalities of gait and mobility (R26.89);Muscle weakness (generalized) (M62.81)    Time: 1062-6948 PT Time Calculation (min) (ACUTE ONLY): 34 min   Charges:   PT Evaluation $PT Eval Low Complexity: 1 Low PT Treatments $Therapeutic Activity: 8-22 mins        Stacie Glaze, PT Acute Rehabilitation Services Pager [phone]  Office [phone]   Louis Matte 02/19/2021, 4:58 PM

## 2021-02-19 NOTE — H&P (Signed)
[facility] Admission History and Physical Service Pager: [phone]  Patient name: [name] Medical record number: [id number] Date of birth: May 03, 1951 Age: 70 y.o. Gender: male  Primary Care Provider: Richarda Osmond, DO Consultants: Vascular surgery Code Status: Full Preferred Emergency Contact: Girlfriend Carlus Pavlov [phone]) or brother Saifan Rayford [phone])  Chief Complaint: S/P left above the knee amputation performed by Dr. Jamelle Haring  Assessment and Plan: [name] is a 70 y.o. male presenting with scheduled left AKA. PMH is significant for HTN, HLD, PAD, T2DM, IDA.  Left Femoral to Peroneal Bypass Occlusion, S/P L AKA 2/25 Patient see in Vascular office on 2/22 for f/u PAD. Noted to have four weeks of pain and worsening ulceration of left 3rd toe with dry gangrene. Duplex performed which showed left femoral to peroneal bypass was occluded. L AKA this AM. Performed without complication per surgery and family medicine was called to admit to be primary. Per vascular PA Risa Grill, advised to keep patient off anticoagulation until the morning and use right SCD today. Vascular will also plan to manage pain medications. -Admit to inpatient, Dr. McDiarmid's service -Vitals per floor protocol -Vascular following, appreciate recs -Will restart VTE PPx 2/26 AM, SCD on right LE today -Pain medications per vascular -PT/OT -Heart healthy/carb modified diet -CBC scheduled for 1600 for post-procedure hemoglobin level -Miralax PRN constipation with pain medication  T2DM with Neuropathy Previously on metformin 1000mg  BID, recently discontinued by PCP. Gabapentin 300mg  qhs for neuropathic pain. Last A1c 5.6 on 11/30/20. CBGs 97-106 since arrival. -sSSI with CBGs TID AC HS -Continue home gabapentin  HTN Home medications include benazepril 5mg  qd, nebivolol 20mg  qd. BP on presentation 100/80. Repeats have been 150s SBP, and  post-surgical BP 103/67.  -Monitor Bps -Restart home antihypertensives 2/26  HLD Home medications include Lipitor 40mg  qd, Zetia 10mg  qd. Last lipid panel 12/21, LDL 37.   -Continue home medications  Iron Deficiency Anemia Admission in December 2021 for symptomatic anemia. EGD and colonoscopy that resulted in stomach biopsy showing gastritis. Home medications 40mg  Protonix. Hgb on admission 11.8. -Holding post-procedure VTE PPx per above -CBC scheduled for 1600 for post-procedure hemoglobin level -AM CBC -Continue home Protonix  Tobacco Abuse  Patient states he doesn't smoke even half of a cigar daily.  -Nicotine patch 7mg    Depression Home medication list has Prozac 10mg  qd listed, but patient reports he is not taking this. -No medications for now  FEN/GI: Heart healthy diet Prophylaxis: SCDs RLE, start lovenox 40mg  tomorrow am  Disposition: Inpatient, admit to [place], attending Dr. McDiarmid  History of Present Illness:  [name] is a 70 y.o. male presenting for scheduled L AKA 2/2 failed revascularization attempts and nonhealing ulcerations. Patient was seen post-surgery and was slightly groggy on exam, but answered questions appropriately and able to stay awake throughout conversation. States pain at surgical site is 7/10.  Review Of Systems: Per HPI with the following additions:   Review of Systems  Respiratory: Negative for shortness of breath.   Cardiovascular: Negative for chest pain.  Gastrointestinal: Negative for diarrhea and vomiting.    Patient Active Problem List   Diagnosis Date Noted  . Limb ischemia 02/19/2021  . Diarrhea 01/07/2021  . Symptomatic anemia 12/04/2020  . PAOD (peripheral arterial occlusive disease) ([place]) 04/24/2020  . Adhesive capsulitis 03/10/2020  . Lung nodule < 6cm on CT 01/28/2019  . Type 2 diabetes with complication ([place]) 62/95/2841  . Angiodysplasia of small intestine ([place]) 03/29/2018  .  Depression 10/03/2016  . Diabetic  neuropathy ([place]) 02/27/2014  . Iron deficiency anemia 08/01/2012  . OBESITY, NOS 02/22/2007  . Tobacco abuse 02/22/2007  . HYPERTENSION, BENIGN SYSTEMIC 02/22/2007  . PAD (peripheral artery disease) ([place]) 02/22/2007  . Osteoarthritis 02/22/2007   Past Medical History: Past Medical History:  Diagnosis Date  . Anemia   . Arthritis    OA  . Cervical radiculopathy    Dr. Vertell Limber neurosurgery  . Chronic lower back pain   . Claudication of both lower extremities ([place]) 07/15/2015  . Critical lower limb ischemia ([place]) 12/19/2019  . Diabetes mellitus    takes Metformin daily  . GERD (gastroesophageal reflux disease)    takes Protonix daily  . History of blood transfusion    "related to low HgB" ((09/10/2015  . Hyperlipidemia    takes Vytorin daily  . Hypertension    takes Benazepril and Bystolic daily  . PAD (peripheral artery disease) ([place])   . Pneumonia   . Radiculopathy, cervical region 02/11/2017  . Shortness of breath dyspnea    with exertion  . Tobacco user   . Toe fracture, right 05/09/2011  . Wears glasses    Past Surgical History: Past Surgical History:  Procedure Laterality Date  . ABDOMINAL AORTOGRAM W/LOWER EXTREMITY N/A 12/11/2017   Procedure: ABDOMINAL AORTOGRAM W/LOWER EXTREMITY;  Surgeon: Angelia Mould, MD;  Location: [place] CV LAB;  Service: Cardiovascular;  Laterality: N/A;  . ABDOMINAL AORTOGRAM W/LOWER EXTREMITY Bilateral 04/22/2020   Procedure: ABDOMINAL AORTOGRAM W/LOWER EXTREMITY;  Surgeon: Marty Heck, MD;  Location: [place] CV LAB;  Service: Cardiovascular;  Laterality: Bilateral;  . ANTERIOR CERVICAL DECOMP/DISCECTOMY FUSION  03/08/12   C6-7  . ANTERIOR CERVICAL DECOMP/DISCECTOMY FUSION  03/08/2012   Procedure: ANTERIOR CERVICAL DECOMPRESSION/DISCECTOMY FUSION 1 LEVEL/HARDWARE REMOVAL;  Surgeon: Erline Levine, MD;  Location: [place] NEURO ORS;  Service: Neurosurgery;  Laterality: N/A;  revison of C5-7 anterior cervical decompression with  fusion with Cervical Five-Thoracic One anterior cervical decompression with fusion with interbody prothesis plating and bonegraft  . BACK SURGERY  1996  . BIOPSY  12/05/2020   Procedure: BIOPSY;  Surgeon: Ladene Artist, MD;  Location[facility] END[facility]COPY;  Service: Endoscopy;;  . BYPASS GRAFT FEMORAL-PERONEAL Left 11/11/2016   Procedure: REDO LEFT FEMORAL-PERONEAL BYPASS WITH PROPATEN 6MM X 80CM GRAFT;  Surgeon: Angelia Mould, MD;  Location: [facility] OR;  Service: Vascular;  Laterality: Left;  . COLONOSCOPY W/ BIOPSIES AND POLYPECTOMY  08/17/2012   f/u 5 years, 4 polyps, no high grade dysplasia or malignancy, tubular adenoma, hyperplastic polyops  . COLONOSCOPY WITH PROPOFOL N/A 12/05/2020   Procedure: COLONOSCOPY WITH PROPOFOL;  Surgeon: Ladene Artist, MD;  Location: [facility] ENDOSCOPY;  Service: Endoscopy;  Laterality: N/A;  . EMBOLECTOMY Left 12/19/2019   Procedure: LEFT FEMERAL- PERONEAL THROMBECTOMY;  Surgeon: Marty Heck, MD;  Location: [place];  Service: Vascular;  Laterality: Left;  . ENDARTERECTOMY FEMORAL Right 04/24/2020   Procedure: RIGHT FEMORAL ENDARTERECTOMY;  Surgeon: Marty Heck, MD;  Location: [place];  Service: Vascular;  Laterality: Right;  . ENTEROSCOPY N/A 12/11/2015   Procedure: ENTEROSCOPY;  Surgeon: Carol Ada, MD;  Location: [facility] ENDOSCOPY;  Service: Endoscopy;  Laterality: N/A;  . ENTEROSCOPY N/A 03/29/2018   Procedure: ENTEROSCOPY;  Surgeon: Carol Ada, MD;  Location: [facility] ENDOSCOPY;  Service: Endoscopy;  Laterality: N/A;  . ESOPHAGOGASTRODUODENOSCOPY  08/17/2012   normal esophagus and GEJ, diffuse gastritis with erythema- no malignancy, reactive gastropathy  with focal intestinal metaplasia  . ESOPHAGOGASTRODUODENOSCOPY (EGD) WITH  PROPOFOL N/A 12/05/2020   Procedure: ESOPHAGOGASTRODUODENOSCOPY (EGD) WITH PROPOFOL;  Surgeon: Ladene Artist, MD;  Location: [facility] ENDOSCOPY;  Service: Endoscopy;  Laterality: N/A;  . FEMORAL-POPLITEAL BYPASS GRAFT Left 01/06/2016    Procedure: Left  COMMON FEMORAL-BELOW KNEE POPLITEAL ARTERY Bypass using non-reversed translocated saphenous vein graft from left leg;  Surgeon: Mal Misty, MD;  Location: [place];  Service: Vascular;  Laterality: Left;  . GIVENS CAPSULE STUDY N/A 11/24/2015   Procedure: GIVENS CAPSULE STUDY;  Surgeon: Juanita Craver, MD;  Location: [place];  Service: Endoscopy;  Laterality: N/A;  . HOT HEMOSTASIS N/A 03/29/2018   Procedure: HOT HEMOSTASIS (ARGON PLASMA COAGULATION/BICAP);  Surgeon: Carol Ada, MD;  Location: [place];  Service: Endoscopy;  Laterality: N/A;  . INGUINAL HERNIA REPAIR  1990's   right  . INTRAOPERATIVE ARTERIOGRAM Left 01/06/2016   Procedure: INTRA OPERATIVE ARTERIOGRAM LEFT LOWER LEG;  Surgeon: Mal Misty, MD;  Location: [place];  Service: Vascular;  Laterality: Left;  . INTRAOPERATIVE ARTERIOGRAM Left 11/11/2016   Procedure: INTRA OPERATIVE ARTERIOGRAM LEFT LOWER EXTRIMITY;  Surgeon: Angelia Mould, MD;  Location: [place];  Service: Vascular;  Laterality: Left;  . IR ANGIOGRAM FOLLOW UP STUDY  12/11/2017  . IR GENERIC HISTORICAL  10/24/2016   IR ANGIOGRAM FOLLOW UP STUDY  . LOWER EXTREMITY ANGIOGRAM Bilateral 07/30/2015   Procedure: Lower Extremity Angiogram;  Surgeon: Conrad [place]am [place]ch, MD;  Location: [place] CV LAB;  Service: Cardiovascular;  Laterality: Bilateral;  . Candor   "lower"  . PATCH ANGIOPLASTY Left 12/19/2019   Procedure: LEFT FEMORAL -PERONEAL PATCH ANGIOPLASTY WITH XENOSURE BIOLOGIC PATCH  ;  Surgeon: Marty Heck, MD;  Location: [place];  Service: Vascular;  Laterality: Left;  . PATCH ANGIOPLASTY Right 04/24/2020   Procedure: Patch Angioplasty of Right Femoral Artery using Long Xenosure Biologic Patch 1cm x 14 cm;  Surgeon: Marty Heck, MD;  Location: [facility] OR;  Service: Vascular;  Laterality: Right;  . PERIPHERAL VASCULAR CATHETERIZATION N/A 07/30/2015   Procedure: Abdominal Aortogram;  Surgeon: Conrad Thrall, MD;   Location: [place] CV LAB;  Service: Cardiovascular;  Laterality: N/A;  . PERIPHERAL VASCULAR CATHETERIZATION N/A 10/24/2016   Procedure: Abdominal Aortogram;  Surgeon: Angelia Mould, MD;  Location: [place] CV LAB;  Service: Cardiovascular;  Laterality: N/A;  . PERIPHERAL VASCULAR CATHETERIZATION N/A 10/24/2016   Procedure: Lower Extremity Angiography;  Surgeon: Angelia Mould, MD;  Location: [place] CV LAB;  Service: Cardiovascular;  Laterality: N/A;  . PERIPHERAL VASCULAR INTERVENTION Right 12/11/2017   Procedure: PERIPHERAL VASCULAR INTERVENTION;  Surgeon: Angelia Mould, MD;  Location: [place] CV LAB;  Service: Cardiovascular;  Laterality: Right;  Marland Kitchen VASCULAR SURGERY  ~ 2007   Stent SFA   . VEIN HARVEST Left 01/06/2016   Procedure: LEFT GREATER SAPPHENOUS VEIN HARVEST;  Surgeon: Mal Misty, MD;  [facility] OR;  Service: Vascular;  Laterality: Left;    Social History: Social History   Tobacco Use  . Smoking status: Current Some Day Smoker    Years: 24.00    Types: Cigars  . Smokeless tobacco: Never Used  Vaping Use  . Vaping Use: Never used  Substance Use Topics  . Alcohol use: Yes    Alcohol/week: 2.0 standard drinks    Types: 2 Cans of beer per week  . Drug use: No   Additional social history: Lives in a house in [place] with girlfriend Tamela Oddi) and two dogs. Smokes less than 0.5  cigars daily, trying to quit, in rehab program.  Please also refer to relevant sections of EMR.  Family History: Family History  Problem Relation Age of Onset  . Cancer Mother        colon Cancer  . Hyperlipidemia Mother   . Diabetes Mother   . Heart disease Father   . Hypertension Father   . Cancer Sister        Uterine  . Diabetes Sister   . Diabetes Brother    Allergies and Medications: Allergies  Allergen Reactions  . Glipizide Other (See Comments)    REACTION IS SIDE EFFECT Severe hypoglycemia to 40s.    No current  facility-administered medications on file prior to encounter.   Current Outpatient Medications on File Prior to Encounter  Medication Sig Dispense Refill  . atorvastatin (LIPITOR) 40 MG tablet TAKE 1 TABLET BY MOUTH ONCE DAILY (Patient taking differently: Take 40 mg by mouth at bedtime.) 90 tablet 2  . benazepril (LOTENSIN) 5 MG tablet TAKE 1 TABLET BY MOUTH DAILY (Patient taking differently: Take 5 mg by mouth daily.) 90 tablet 0  . ezetimibe (ZETIA) 10 MG tablet TAKE 1 TABLET BY MOUTH ONCE DAILY (Patient taking differently: Take 10 mg by mouth daily.) 90 tablet 1  . gabapentin (NEURONTIN) 300 MG capsule TAKE ONE (1) CAPSULE BY MOUTH EVERY NIGHT AT BEDTIME AS NEEDED (Patient taking differently: Take 300 mg by mouth at bedtime.) 90 capsule 1  . metFORMIN (GLUCOPHAGE) 1000 MG tablet TAKE ONE (1) TABLET BY MOUTH TWICE DAILY WITH MEALS 90 tablet 1  . Nebivolol HCl 20 MG TABS TAKE 1 TABLET BY MOUTH DAILY (Patient taking differently: Take 20 mg by mouth daily.) 90 tablet 0  . oxyCODONE-acetaminophen (PERCOCET) 5-325 MG tablet Take 1 tablet by mouth every 4 (four) hours as needed for severe pain. 20 tablet 0  . pantoprazole (PROTONIX) 40 MG tablet Take 1 tablet (40 mg total) by mouth daily as needed. (Patient taking differently: Take 40 mg by mouth daily.) 90 tablet 0  . acetaminophen (TYLENOL) 325 MG tablet Take 650 mg by mouth every 6 (six) hours as needed for moderate pain.    . cilostazol (PLETAL) 100 MG tablet TAKE 1 TABLET BY MOUTH TWICE A DAY (Patient not taking: No sig reported) 60 tablet 1  . diclofenac Sodium (VOLTAREN) 1 % GEL Apply 2 g topically 4 (four) times daily. 50 g 1   Objective: BP 103/67 (BP Location: Right Arm)   Pulse 73   Temp 98.7 F (37.1 C) (Oral)   Resp 17   Ht 5' 6.5" (1.689 m)   Wt 79.8 kg   SpO2 97%   BMI 27.98 kg/m   Exam: General: Resting in bed, comfortable, pleasant, alert and oriented. Neck: Supple, without adenopathy Cardiovascular: S1S2 noted, RRR,  aortic stenosis murmur Respiratory: Clear to auscultation bilaterally, no increased work of breathing on room air Gastrointestinal: Soft, bowel sounds present, nontender, nondistended Skin: Warm and dry Extremities: Left AKA with ace bandage placed on surgical site. No increased erythema or drainage noted. RLE no edema. Neuro: CN II-XII grossly intact. Psych: Mood appropriate, normal affect  Labs and Imaging: CBC BMET  Recent Labs  Lab 02/19/21 0702  WBC 6.4  HGB 11.8*  HCT 39.6  PLT 292   Recent Labs  Lab 02/19/21 0702  NA 136  K 4.4  CL 99  CO2 25  BUN 10  CREATININE 0.86  GLUCOSE 106*  CALCIUM 9.5     EKG: My own  interpretation (not copied from electronic read) NSR with possible LVH  Karle Plumber, Medical Student 02/19/2021, 3:08 PM Acting Intern, [place] Intern pager: [phone], text pages welcome  FPTS Upper-Level Resident Addendum   I have independently interviewed and examined the patient. I have discussed the above with the original author and agree with their documentation. My edits for correction/addition/clarification are in blue. Please see also any attending notes.    Matilde Haymaker MD PGY-3, [facility] 02/19/2021 5:53 PM  [place] Service pager: [phone] (text pages welcome through [place])

## 2021-02-19 NOTE — Progress Notes (Signed)
Rehab Admissions Coordinator Note:  Patient was screened by Cleatrice Burke for appropriateness for an Inpatient Acute Rehab Consult per therapy recommendations. Current payor trends with [facility] will unlikely approve amputations for CIR admit. Recommend other rehab venues to be pursued.  Cleatrice Burke RN MSN 02/19/2021, 5:10 PM  I can be reached at [phone].

## 2021-02-19 NOTE — Transfer of Care (Signed)
Immediate Anesthesia Transfer of Care Note  Patient: [name]  Procedure(s) Performed: LEFT ABOVE KNEE AMPUTATION (Left Knee)  Patient Location: PACU  Anesthesia Type:General  Level of Consciousness: drowsy  Airway & Oxygen Therapy: Patient Spontanous Breathing and Patient connected to nasal cannula oxygen  Post-op Assessment: Report given to RN and Post -op Vital signs reviewed and stable  Post vital signs: Reviewed and stable  Last Vitals:  Vitals Value Taken Time  BP 153/67 02/19/21 1101  Temp    Pulse 77 02/19/21 1102  Resp 26 02/19/21 1102  SpO2 98 % 02/19/21 1102  Vitals shown include unvalidated device data.  Last Pain:  Vitals:   02/19/21 0734  TempSrc:   PainSc: 10-Worst pain ever      Patients Stated Pain Goal: 3 (94/83/47 5830)  Complications: No complications documented.

## 2021-02-19 NOTE — Anesthesia Preprocedure Evaluation (Signed)
Anesthesia Evaluation  Patient identified by MRN, date of birth, ID band Patient awake    Reviewed: Allergy & Precautions, NPO status , Patient's Chart, lab work & pertinent test results  Airway Mallampati: II  TM Distance: >3 FB     Dental   Pulmonary shortness of breath, pneumonia, Current Smoker and Patient abstained from smoking.,    breath sounds clear to auscultation       Cardiovascular hypertension, + Peripheral Vascular Disease   Rhythm:Regular Rate:Normal     Neuro/Psych Depression  Neuromuscular disease    GI/Hepatic Neg liver ROS, GERD  ,  Endo/Other  diabetes  Renal/GU negative Renal ROS     Musculoskeletal   Abdominal   Peds  Hematology   Anesthesia Other Findings   Reproductive/Obstetrics                             Anesthesia Physical Anesthesia Plan  ASA: III  Anesthesia Plan: General   Post-op Pain Management:    Induction: Intravenous  PONV Risk Score and Plan: 2 and Ondansetron, Dexamethasone and Midazolam  Airway Management Planned: LMA  Additional Equipment:   Intra-op Plan:   Post-operative Plan: Extubation in OR  Informed Consent:     Dental advisory given  Plan Discussed with: CRNA and Anesthesiologist  Anesthesia Plan Comments:         Anesthesia Quick Evaluation

## 2021-02-19 NOTE — Progress Notes (Addendum)
Family Medicine Teaching Service Daily Progress Note Intern Pager: [phone]  Patient name: [name] Medical record number: [id number] Date of birth: 09-20-1951 Age: 70 y.o. Gender: male  Primary Care Provider: Richarda Osmond, DO Consultants: Ortho Code Status: Full  Pt Overview and Major Events to Date:  2/25-left AKA 2/25-Brought on to FPTS   Assessment and Plan:  [name] is a 70 y.o. male presenting with scheduled left AKA. PMH is significant for HTN, HLD, PAD, T2DM, IDA.  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 Noted to have four weeks of pain and worsening ulceration of left 3rd toe with dry gangrene.  Now status post left AKA yesterday.  POD #1.  Per vascular PA [name], advised to keep patient off anticoagulation this  morning and use right SCD today. Vascular will also plan to manage pain medications.  Hemoglobin of 11.8 on presentation, post procedure hemoglobin of 10.7. -Vascular following, appreciate recs -Will restart VTE PPx this AM -Pain medications per vascular -PT/OT -Heart healthy/carb modified diet -Miralax PRN constipation with pain medication  T2DM with Neuropathy Previously on metformin 1000mg  BID, recently discontinued by PCP. Gabapentin 300mg  qhs for neuropathic pain. Last A1c 5.6 on 11/30/20. Recent CBGs 145-159. -sSSI with CBGs TID AC HS -Continue home gabapentin  HTN Home medications include benazepril 5mg  qd, nebivolol 20mg  qd.  Most recent BP of 118/64. Systolic blood pressures have been in the range of 103-147 since surgery. -Monitor Bps -Currently holding home antihypertensives, can consider restarting as needed for blood pressure management  HLD Home medications include Lipitor 40mg  qd, Zetia 10mg  qd.Last lipid panel 12/21, LDL 37.  -Continue home medications  Iron Deficiency Anemia Admission in December 2021 for symptomatic anemia. EGD and  colonoscopy that resulted in stomach biopsy showing gastritis. Home medications 40mg  Protonix. Hgb on admission 11.8, 10.7 post AKA -AM CBC -Continue home Protonix  Tobacco Abuse  Patient states he doesn't smoke even half of a cigar daily.  -Nicotine patch 7mg    Depression Home medication list has Prozac 10mg  qd listed, but patient reports he is not taking this. -No medications for now  Mild Hyponatremia Sodium of 132 this am, corrected 133. Was 136 yesterday. Likely secondary to fluids. Will continue to monitor. -AM BMP  FEN/GI: Heart healthy diet Prophylaxis: Lovenox 40mg    Status is: Inpatient  Remains inpatient appropriate because:Inpatient level of care appropriate due to severity of illness   Dispo: The patient is from: Home              Anticipated d/c is to: SNF versus CIR              Patient currently is not medically stable to d/c.   Difficult to place patient No  Subjective:  Patient in no apparent distress this morning.  Says his pain is controlled at this time.  States he did not sleep well in the hospital but otherwise has no complaints at this time.  Objective: Temp:  [98.2 F (36.8 C)-99.1 F (37.3 C)] 99.1 F (37.3 C) (02/25 1900) Pulse Rate:  [73-81] 81 (02/25 1900) Resp:  [17-25] 18 (02/25 1900) BP: (100-156)/(61-83) 116/74 (02/25 1900) SpO2:  [92 %-100 %] 97 % (02/25 1900) Weight:  [79.8 kg] 79.8 kg (02/25 0720) Physical Exam: General: Alert and oriented in no apparent distress Heart: Regular rate and rhythm with murmur present Lungs: CTA bilaterally, no wheezing Abdomen: Bowel sounds present,  no abdominal pain Skin: Warm and dry Extremities: Right lower limb with no edema, left above-knee amputation which is wrapped with no obvious bleeding or drainage   Laboratory: Recent Labs  Lab 02/19/21 0702 02/19/21 1603  WBC 6.4 8.4  HGB 11.8* 10.7*  HCT 39.6 35.1*  PLT 292 269   Recent Labs  Lab 02/19/21 0702  NA 136  K 4.4  CL 99   CO2 25  BUN 10  CREATININE 0.86  CALCIUM 9.5  PROT 7.4  BILITOT 0.4  ALKPHOS 72  ALT 17  AST 20  GLUCOSE 106Lurline Del, DO 02/19/2021, 9:39 PM PGY-2, [place] Intern pager: [phone], text pages welcome

## 2021-02-19 NOTE — Anesthesia Postprocedure Evaluation (Signed)
Anesthesia Post Note  Patient: [name]  Procedure(s) Performed: LEFT ABOVE KNEE AMPUTATION (Left Knee)     Patient location during evaluation: PACU Anesthesia Type: General Level of consciousness: awake Pain management: pain level controlled Vital Signs Assessment: post-procedure vital signs reviewed and stable Respiratory status: spontaneous breathing Cardiovascular status: stable Postop Assessment: no apparent nausea or vomiting Anesthetic complications: no   No complications documented.  Last Vitals:  Vitals:   02/19/21 1145 02/19/21 1200  BP: (!) 150/68 (!) 145/74  Pulse: 77 75  Resp: (!) 22 19  Temp:    SpO2: 93% 97%    Last Pain:  Vitals:   02/19/21 1200  TempSrc:   PainSc: Asleep                 CHARLENE GREEN

## 2021-02-19 NOTE — Progress Notes (Signed)
1350 Received pt from PACU, A&O x4. Left AKA dressing with ace wrap dry and intact. A little sleepy, denies pain at this time.

## 2021-02-19 NOTE — Anesthesia Procedure Notes (Signed)
Procedure Name: LMA Insertion Date/Time: 02/19/2021 9:38 AM Performed by: Imagene Riches, CRNA Pre-anesthesia Checklist: Patient identified, Emergency Drugs available, Suction available and Patient being monitored Patient Re-evaluated:Patient Re-evaluated prior to induction Oxygen Delivery Method: Circle System Utilized Preoxygenation: Pre-oxygenation with 100% oxygen Induction Type: IV induction Ventilation: Mask ventilation without difficulty LMA: LMA inserted LMA Size: 4.0 Number of attempts: 1 Airway Equipment and Method: Bite block Placement Confirmation: positive ETCO2 Tube secured with: Tape Dental Injury: Teeth and Oropharynx as per pre-operative assessment

## 2021-02-19 NOTE — Care Plan (Signed)
Patient has PCP with [facility]. I contacted them for admission orders. Pager number is : [phone]

## 2021-02-20 ENCOUNTER — Other Ambulatory Visit: Payer: Self-pay | Admitting: Student in an Organized Health Care Education/Training Program

## 2021-02-20 ENCOUNTER — Encounter (HOSPITAL_COMMUNITY): Payer: Self-pay | Admitting: Vascular Surgery

## 2021-02-20 DIAGNOSIS — E1151 Type 2 diabetes mellitus with diabetic peripheral angiopathy without gangrene: Secondary | ICD-10-CM | POA: Diagnosis not present

## 2021-02-20 DIAGNOSIS — Z89612 Acquired absence of left leg above knee: Secondary | ICD-10-CM | POA: Diagnosis not present

## 2021-02-20 DIAGNOSIS — I10 Essential (primary) hypertension: Secondary | ICD-10-CM | POA: Diagnosis not present

## 2021-02-20 DIAGNOSIS — E1142 Type 2 diabetes mellitus with diabetic polyneuropathy: Secondary | ICD-10-CM | POA: Diagnosis not present

## 2021-02-20 LAB — HEMOGLOBIN A1C
Hgb A1c MFr Bld: 6.1 % — ABNORMAL HIGH (ref 4.8–5.6)
Mean Plasma Glucose: 128.37 mg/dL

## 2021-02-20 LAB — BASIC METABOLIC PANEL
Anion gap: 11 (ref 5–15)
BUN: 11 mg/dL (ref 8–23)
CO2: 22 mmol/L (ref 22–32)
Calcium: 8.8 mg/dL — ABNORMAL LOW (ref 8.9–10.3)
Chloride: 99 mmol/L (ref 98–111)
Creatinine, Ser: 0.75 mg/dL (ref 0.61–1.24)
GFR, Estimated: >60 mL/min (ref >60)
Glucose, Bld: 147 mg/dL — ABNORMAL HIGH (ref 70–99)
Potassium: 3.7 mmol/L (ref 3.5–5.1)
Sodium: 132 mmol/L — ABNORMAL LOW (ref 135–145)

## 2021-02-20 LAB — URINALYSIS, ROUTINE W REFLEX MICROSCOPIC
Bilirubin Urine: NEGATIVE
Glucose, UA: NEGATIVE mg/dL
Hgb urine dipstick: NEGATIVE
Ketones, ur: 5 mg/dL — AB
Leukocytes,Ua: NEGATIVE
Nitrite: POSITIVE — AB
Protein, ur: NEGATIVE mg/dL
Specific Gravity, Urine: 1.02 (ref 1.005–1.030)
pH: 5 (ref 5.0–8.0)

## 2021-02-20 LAB — CBC WITH DIFFERENTIAL/PLATELET
Abs Immature Granulocytes: 0.04 10*3/uL (ref 0.00–0.07)
Basophils Absolute: 0 10*3/uL (ref 0.0–0.1)
Basophils Relative: 0 %
Eosinophils Absolute: 0 10*3/uL (ref 0.0–0.5)
Eosinophils Relative: 0 %
HCT: 34.6 % — ABNORMAL LOW (ref 39.0–52.0)
Hemoglobin: 10.7 g/dL — ABNORMAL LOW (ref 13.0–17.0)
Immature Granulocytes: 0 %
Lymphocytes Relative: 11 %
Lymphs Abs: 1.1 10*3/uL (ref 0.7–4.0)
MCH: 22.3 pg — ABNORMAL LOW (ref 26.0–34.0)
MCHC: 30.9 g/dL (ref 30.0–36.0)
MCV: 72.2 fL — ABNORMAL LOW (ref 80.0–100.0)
Monocytes Absolute: 1.1 10*3/uL — ABNORMAL HIGH (ref 0.1–1.0)
Monocytes Relative: 11 %
Neutro Abs: 8.3 10*3/uL — ABNORMAL HIGH (ref 1.7–7.7)
Neutrophils Relative %: 78 %
Platelets: 275 10*3/uL (ref 150–400)
RBC: 4.79 MIL/uL (ref 4.22–5.81)
RDW: 20 % — ABNORMAL HIGH (ref 11.5–15.5)
WBC: 10.6 10*3/uL — ABNORMAL HIGH (ref 4.0–10.5)
nRBC: 0 % (ref 0.0–0.2)

## 2021-02-20 LAB — GLUCOSE, CAPILLARY
Glucose-Capillary: 138 mg/dL — ABNORMAL HIGH (ref 70–99)
Glucose-Capillary: 125 mg/dL — ABNORMAL HIGH (ref 70–99)
Glucose-Capillary: 116 mg/dL — ABNORMAL HIGH (ref 70–99)
Glucose-Capillary: 114 mg/dL — ABNORMAL HIGH (ref 70–99)

## 2021-02-20 MED ORDER — ENOXAPARIN SODIUM 40 MG/0.4ML ~~LOC~~ SOLN
40.0000 mg | SUBCUTANEOUS | Status: DC
  Administered 2021-02-20 – 2021-02-22 (×3): 40 mg via SUBCUTANEOUS
  Filled 2021-02-20 (×3): qty 0.4

## 2021-02-20 NOTE — Progress Notes (Signed)
[facility] [place]  Subjective  - some left leg pain   Objective 131/61 83 99.4 F (37.4 C) (Oral) 18 95%  Intake/Output Summary (Last 24 hours) at 02/20/2021 1712 Last data filed at 02/20/2021 0115 Gross per 24 hour  Intake --  Output 400 ml  Net -400 ml   Left AKA dressing dry  Assessment/Planning: POD #1 left AKA Will change dressing tomorrow Continue PT OT  Ruta Hinds 02/20/2021 5:12 PM --  Laboratory Lab Results: Recent Labs    02/19/21 1603 02/20/21 0150  WBC 8.4 10.6*  HGB 10.7* 10.7*  HCT 35.1* 34.6*  PLT 269 275   BMET Recent Labs    02/19/21 0702 02/20/21 0150  NA 136 132*  K 4.4 3.7  CL 99 99  CO2 25 22  GLUCOSE 106* 147*  BUN 10 11  CREATININE 0.86 0.75  CALCIUM 9.5 8.8*    COAG Lab Results  Component Value Date   INR 1.1 12/05/2020   INR 1.1 04/24/2020   INR 1.1 12/19/2019   No results found for: PTT

## 2021-02-20 NOTE — Evaluation (Signed)
Occupational Therapy Evaluation Patient Details Name: [name] MRN: [id number] DOB: Sep 21, 1951 Today's Date: 02/20/2021    History of Present Illness 70 yo male s/p L AKA on 02/19/21 due to gangrene. PMH includes extensive vascular surgery history (L femoral to peroneal bypass 12/2015; R EIA stent 2018; thrombectomy of L femoral to peroneal bypass and L peroneal artery 12/20; R femoral endarterectomy, profundoplasty with bovine pericardial patch on 03/2020), OA, DM, GERD, HLD, HTN, PAD, ACDF 2013.   Clinical Impression   Patient admitted for the diagnosis and procedure above.  PTA he was largely independent with ADL and IADLs, using a SPC for mobility.  Currently, due to deficits listed below, he is needing up to Mod A for basic mobility and lower body ADL.  He works very hard, and should progress quickly once pain lessens.  OT will follow in the acute setting to maximize functional status and ensure safe transition to SNF for post acute rehab.      Follow Up Recommendations  SNF    Equipment Recommendations  3 in 1 bedside commode;Tub/shower seat;Wheelchair [phone]");Wheelchair cushion (measurements OT)    Recommendations for Other Services       Precautions / Restrictions Precautions Precautions: Fall Restrictions Weight Bearing Restrictions: Yes LLE Weight Bearing: Non weight bearing Other Position/Activity Restrictions: L AKA      Mobility Bed Mobility Overal bed mobility: Needs Assistance Bed Mobility: Supine to Sit     Supine to sit: Mod assist;HOB elevated          Transfers Overall transfer level: Needs assistance Equipment used: Rolling walker (2 wheeled) Transfers: Stand Pivot Transfers;Sit to/from Stand Sit to Stand: Mod assist Stand pivot transfers: Mod assist            Balance Overall balance assessment: Needs assistance Sitting-balance support: Bilateral upper extremity supported;Feet supported Sitting balance-Leahy Scale: Good     Standing  balance support: Bilateral upper extremity supported Standing balance-Leahy Scale: Poor Standing balance comment: needs RW for external support                           ADL either performed or assessed with clinical judgement   ADL Overall ADL's : Needs assistance/impaired Eating/Feeding: Independent;Sitting   Grooming: Wash/dry hands;Wash/dry face;Oral care;Set up;Sitting   Upper Body Bathing: Set up;Sitting   Lower Body Bathing: Moderate assistance;Sitting/lateral leans   Upper Body Dressing : Set up;Sitting   Lower Body Dressing: Moderate assistance;Sitting/lateral leans               Functional mobility during ADLs: Rolling walker;Moderate assistance       Vision Baseline Vision/History: Wears glasses Wears Glasses: At all times Patient Visual Report: No change from baseline       Perception     Praxis      Pertinent Vitals/Pain Pain Score: 7  Pain Location: L residual limb Pain Descriptors / Indicators: Spasm;Tender;Sharp;Shooting Pain Intervention(s): Monitored during session     Hand Dominance Right   Extremity/Trunk Assessment Upper Extremity Assessment Upper Extremity Assessment: Overall WFL for tasks assessed   Lower Extremity Assessment Lower Extremity Assessment: Defer to PT evaluation   Cervical / Trunk Assessment Cervical / Trunk Assessment: Normal   Communication Communication Communication: No difficulties   Cognition Arousal/Alertness: Awake/alert Behavior During Therapy: WFL for tasks assessed/performed Overall Cognitive Status: Within Functional Limits for tasks assessed  Home Living Family/patient expects to be discharged to:: Private residence Living Arrangements: Spouse/significant other Available Help at Discharge: Family;Available 24 hours/day Type of Home: House Home Access: Level entry     Home Layout: One level     Bathroom  Shower/Tub: Teacher, early years/pre: Standard     Home Equipment: Cane - single point;Bedside commode;Hand held shower head;Walker - 2 wheels          Prior Functioning/Environment Level of Independence: Independent with assistive device(s)        Comments: pt reports using cane for ambulation PTA, independent with ADLs. Pt had HHPT and HHRN.        OT Problem List: Decreased strength;Decreased range of motion;Decreased activity tolerance;Impaired balance (sitting and/or standing);Decreased knowledge of use of DME or AE;Pain      OT Treatment/Interventions: Self-care/ADL training;Therapeutic exercise;DME and/or AE instruction;Balance training;Patient/family education;Therapeutic activities    OT Goals(Current goals can be found in the care plan section) Acute Rehab OT Goals Patient Stated Goal: stop the pain and move better OT Goal Formulation: With patient Time For Goal Achievement: 03/06/21 Potential to Achieve Goals: Good ADL Goals Pt Will Perform Grooming: with supervision;sitting;standing Pt Will Perform Lower Body Bathing: with supervision;sitting/lateral leans Pt Will Perform Lower Body Dressing: with min guard assist;sit to/from stand Pt Will Transfer to Toilet: with supervision;stand pivot transfer;bedside commode Pt Will Perform Toileting - Clothing Manipulation and hygiene: with min guard assist;sit to/from stand  OT Frequency: Min 2X/week   Barriers to D/C:    none noted       Co-evaluation              AM-PAC OT "6 Clicks" Daily Activity     Outcome Measure Help from another person eating meals?: None Help from another person taking care of personal grooming?: None Help from another person toileting, which includes using toliet, bedpan, or urinal?: A Lot Help from another person bathing (including washing, rinsing, drying)?: A Lot Help from another person to put on and taking off regular upper body clothing?: A Little Help from another  person to put on and taking off regular lower body clothing?: A Lot 6 Click Score: 17   End of Session Equipment Utilized During Treatment: Gait belt;Rolling walker Nurse Communication: Mobility status  Activity Tolerance: Patient tolerated treatment well Patient left: in chair;with call bell/phone within reach;with family/visitor present  OT Visit Diagnosis: Unsteadiness on feet (R26.81);Pain Pain - Right/Left: Left Pain - part of body: Leg                Time: 1247-1315 OT Time Calculation (min): 28 min Charges:  OT General Charges $OT Visit: 1 Visit OT Evaluation $OT Eval Moderate Complexity: 1 Mod OT Treatments $Therapeutic Activity: 8-22 mins  02/20/2021  Rich, OTR/L  Acute Rehabilitation Services  Office:  [phone]   Metta Clines 02/20/2021, 1:38 PM

## 2021-02-20 NOTE — Progress Notes (Addendum)
Family Medicine Teaching Service Daily Progress Note Intern Pager: [phone]  Patient name: [name] Medical record number: [id number] Date of birth: Aug 17, 1951 Age: 70 y.o. 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 -Vascular following, appreciate recs -Continue Lovenox 40mg   -Pain management per vascular-Morphine 2 mg every 4 as needed, Percocet 1 to 2 tablets every 4 as needed -PT/OT  -Miralax PRN constipation with pain medication   T2DM with Neuropathy Recent CBGs 114, 116 Home meds: Gabapentin 300mg  qh -sSSI with CBGs TID AC HS -Continue home gabapentin   Hyponatremia Asymptomatic Na 129 today, correction for hyperglycemia 131  -Continue to monitor -AM BMP  Hypokalemia  K 3.4 today Repleted this morning with potassium chloride 42meq  -Monitor with BMP -Replete as necessary    HTN SBP 134-152, DBP 55-73 Home medications: benazepril 5mg   daily, nebivolol 20mg   daily  -Monitor Bps -Continue benazepril 5mg  and nebivolol 20 mg daily   HLD Home medications: Lipitor 40mg  qd, Zetia 10mg  qd.  -Continue Lipitor and Zetia    Iron Deficiency Anemia Hb 10.6, 10.7 yesterday Home medications 40mg  Protonix. -AM CBC -Continue home Protonix   Tobacco Abuse   -Nicotine patch 7mg  daily   Depression Home medication: Prozac 10mg  qd listed, but patient reports he is not taking this. -No medications for now -Continue to monitor mood    FEN/GI: Heart healthy diet Prophylaxis: Lovenox 40mg     Status is: Inpatient  Remains inpatient appropriate because:Inpatient level of care appropriate due to severity of illness  Dispo: The patient is from: Home              Anticipated d/c  is to: SNF              Patient currently is not medically stable to d/c.   Difficult to place patient N   Subjective No acute events overnight, patient is requiring more pain medications now.  I said that he has as needed's on his chart which he can also ask the nurse for.  Objective: Temp:  [98.9 F (37.2 C)-99.6 F (37.6 C)] 98.9 F (37.2 C) (02/27 0300) Pulse Rate:  [62-89] 89 (02/27 0300) Resp:  [17-18] 18 (02/27 0300) BP: (119-152)/(55-73) 147/69 (02/27 0300) SpO2:  [95 %-100 %] 96 % (02/27 0300)    Physical Exam: General: Alert, no acute distress, pleasant Cardio: Normal S1 and S2, RRR, no r/m/g Pulm: CTAB, normal work of breathing Abdomen: Bowel sounds normal. Abdomen soft and non-tender.  Extremities: Right AKA, wrapped in dressing, skin warm and dry, no erythema or edema  Neuro: Cranial nerves grossly intact  Laboratory: Recent Labs  Lab 02/19/21 1603 02/20/21 0150 02/21/21 0056  WBC 8.4 10.6* 7.9  HGB 10.7* 10.7* 10.6*  HCT 35.1* 34.6* 34.8*  PLT 269 275 267   Recent Labs  Lab 02/19/21 0702 02/20/21 0150 02/21/21 0056  NA 136 132* 129*  K 4.4 3.7 3.4*  CL 99 99 96*  CO2 25 22 24   BUN 10 11 10   CREATININE 0.86 0.75 0.70  CALCIUM 9.5 8.8* 8.4*  PROT 7.4  --   --   BILITOT 0.4  --   --   ALKPHOS 72  --   --  ALT 17  --   --   AST 20  --   --   GLUCOSE 106* 147* 180*     Imaging/Diagnostic Tests: No results found.  Lattie Haw, MD 02/21/2021, 6:40 AM PGY-2, [place] Intern pager: [phone], text pages welcome

## 2021-02-21 ENCOUNTER — Other Ambulatory Visit: Payer: Self-pay | Admitting: Student in an Organized Health Care Education/Training Program

## 2021-02-21 DIAGNOSIS — E1151 Type 2 diabetes mellitus with diabetic peripheral angiopathy without gangrene: Secondary | ICD-10-CM | POA: Diagnosis not present

## 2021-02-21 DIAGNOSIS — I10 Essential (primary) hypertension: Secondary | ICD-10-CM | POA: Diagnosis not present

## 2021-02-21 DIAGNOSIS — I739 Peripheral vascular disease, unspecified: Secondary | ICD-10-CM | POA: Diagnosis not present

## 2021-02-21 DIAGNOSIS — Z89612 Acquired absence of left leg above knee: Secondary | ICD-10-CM | POA: Diagnosis not present

## 2021-02-21 LAB — CBC
HCT: 34.8 % — ABNORMAL LOW (ref 39.0–52.0)
Hemoglobin: 10.6 g/dL — ABNORMAL LOW (ref 13.0–17.0)
MCH: 21.9 pg — ABNORMAL LOW (ref 26.0–34.0)
MCHC: 30.5 g/dL (ref 30.0–36.0)
MCV: 71.9 fL — ABNORMAL LOW (ref 80.0–100.0)
Platelets: 267 10*3/uL (ref 150–400)
RBC: 4.84 MIL/uL (ref 4.22–5.81)
RDW: 19.4 % — ABNORMAL HIGH (ref 11.5–15.5)
WBC: 7.9 10*3/uL (ref 4.0–10.5)
nRBC: 0 % (ref 0.0–0.2)

## 2021-02-21 LAB — BASIC METABOLIC PANEL
Anion gap: 9 (ref 5–15)
BUN: 10 mg/dL (ref 8–23)
CO2: 24 mmol/L (ref 22–32)
Calcium: 8.4 mg/dL — ABNORMAL LOW (ref 8.9–10.3)
Chloride: 96 mmol/L — ABNORMAL LOW (ref 98–111)
Creatinine, Ser: 0.7 mg/dL (ref 0.61–1.24)
GFR, Estimated: >60 mL/min (ref >60)
Glucose, Bld: 180 mg/dL — ABNORMAL HIGH (ref 70–99)
Potassium: 3.4 mmol/L — ABNORMAL LOW (ref 3.5–5.1)
Sodium: 129 mmol/L — ABNORMAL LOW (ref 135–145)

## 2021-02-21 LAB — GLUCOSE, CAPILLARY
Glucose-Capillary: 112 mg/dL — ABNORMAL HIGH (ref 70–99)
Glucose-Capillary: 106 mg/dL — ABNORMAL HIGH (ref 70–99)
Glucose-Capillary: 137 mg/dL — ABNORMAL HIGH (ref 70–99)

## 2021-02-21 MED ORDER — POTASSIUM CHLORIDE 20 MEQ PO PACK
40.0000 meq | PACK | Freq: Once | ORAL | Status: AC
  Administered 2021-02-21: 40 meq via ORAL
  Filled 2021-02-21: qty 2

## 2021-02-21 NOTE — Progress Notes (Signed)
Physical Therapy Treatment Patient Details Name: RONDLE LOHSE MRN: [id number] DOB: 10-Dec-1951 Today's Date: 02/21/2021    History of Present Illness 70 yo male s/p L AKA on 02/19/21 due to gangrene. PMH includes extensive vascular surgery history (L femoral to peroneal bypass 12/2015; R EIA stent 2018; thrombectomy of L femoral to peroneal bypass and L peroneal artery 12/20; R femoral endarterectomy, profundoplasty with bovine pericardial patch on 03/2020), OA, DM, GERD, HLD, HTN, PAD, ACDF 2013.    PT Comments    Continuing work on functional mobility and activity tolerance;  Making slow, but notable progress; Able to work on transfers including lateral scoot, sit<>Stand, and basic pivot transfers towards the R side; Discussed technqiues for managing pahntom pain and sensation, and the need to strengthen gluteals and stretch hip flexors in prep for prosthesis   Follow Up Recommendations  Home health PT;Supervision/Assistance - 24 hour     Equipment Recommendations  Rolling walker with 5" wheels;3in1 (PT);Wheelchair (measurements PT);Wheelchair cushion (measurements PT);Other (comment) (consider a drop-arm BSCq)    Recommendations for Other Services       Precautions / Restrictions Precautions Precautions: Fall Restrictions LLE Weight Bearing: Non weight bearing Other Position/Activity Restrictions: L AKA    Mobility  Bed Mobility               General bed mobility comments: Sitting EOB upon arrival; Scooted self to EOB with minguard assist for safety;Cues and suggestions for technique    Transfers Overall transfer level: Needs assistance Equipment used: Rolling walker (2 wheeled) Transfers: Stand Pivot Transfers;Sit to/from Stand;Lateral/Scoot Transfers Sit to Stand: Min assist Stand pivot transfers: Min guard      Lateral/Scoot Transfers: Supervision General transfer comment: Simulated lateral scooting sitting EOB and scooting towards foot of bed; cues to weight  shft onto R foot to unweigh hips for scooting; stood to single limb stance at EOB with min assist t osteady; took extra time as pt tried different hand placements; Once up, pt was able to move hand to far armrest and pivot to recliner  Ambulation/Gait                 Stairs             Wheelchair Mobility    Modified Rankin (Stroke Patients Only)       Balance                                            Cognition Arousal/Alertness: Awake/alert Behavior During Therapy: WFL for tasks assessed/performed Overall Cognitive Status: Within Functional Limits for tasks assessed                                 General Comments: very pleasant, hard-working      Exercises      General Comments General comments (skin integrity, edema, etc.): REinforced technqiues to help with phantom sensations and pain; Discussed the need to stretch hip flexor and strangthen hip extensors in prep for prosthesis      Pertinent Vitals/Pain Pain Assessment: 0-10 Pain Score: 8  Pain Location: L residual limb Pain Descriptors / Indicators: Spasm;Tender;Sharp;Shooting Pain Intervention(s): Monitored during session;Patient requesting pain meds-RN notified;RN gave pain meds during session    Home Living  Prior Function            PT Goals (current goals can now be found in the care plan section) Acute Rehab PT Goals Patient Stated Goal: stop the pain and move better PT Goal Formulation: With patient Time For Goal Achievement: 03/05/21 Potential to Achieve Goals: Good Progress towards PT goals: Progressing toward goals    Frequency    Min 3X/week      PT Plan Discharge plan needs to be updated (Pt declining SNF; choosing to go home)    Co-evaluation              AM-PAC PT "6 Clicks" Mobility   Outcome Measure  Help needed turning from your back to your side while in a flat bed without using bedrails?: A  Little Help needed moving from lying on your back to sitting on the side of a flat bed without using bedrails?: A Little Help needed moving to and from a bed to a chair (including a wheelchair)?: A Little Help needed standing up from a chair using your arms (e.g., wheelchair or bedside chair)?: A Lot Help needed to walk in hospital room?: A Lot Help needed climbing 3-5 steps with a railing? : A Lot 6 Click Score: 15    End of Session Equipment Utilized During Treatment: Gait belt Activity Tolerance: Patient tolerated treatment well Patient left: in chair;with call bell/phone within reach;with chair alarm set Nurse Communication: Mobility status PT Visit Diagnosis: Other abnormalities of gait and mobility (R26.89);Muscle weakness (generalized) (M62.81)     Time: 3709-6438 PT Time Calculation (min) (ACUTE ONLY): 28 min  Charges:  $Therapeutic Activity: 23-37 mins                     Roney Marion, PT  Acute Rehabilitation Services Pager [phone] Office [place] 02/21/2021, 8:26 PM

## 2021-02-21 NOTE — Progress Notes (Signed)
[facility] [place]  Subjective  - left leg sore   Objective (!) 147/69 89 98.9 F (37.2 C) (Oral) 18 96%  Intake/Output Summary (Last 24 hours) at 02/21/2021 0936 Last data filed at 02/20/2021 1700 Gross per 24 hour  Intake 1000 ml  Output -  Net 1000 ml   Left AKA incision intact, minimal edema  Assessment/Planning: POD #2 left AKA Continue PT OT  Ruta Hinds 02/21/2021 9:36 AM --  Laboratory Lab Results: Recent Labs    02/20/21 0150 02/21/21 0056  WBC 10.6* 7.9  HGB 10.7* 10.6*  HCT 34.6* 34.8*  PLT 275 267   BMET Recent Labs    02/20/21 0150 02/21/21 0056  NA 132* 129*  K 3.7 3.4*  CL 99 96*  CO2 22 24  GLUCOSE 147* 180*  BUN 11 10  CREATININE 0.75 0.70  CALCIUM 8.8* 8.4*    COAG Lab Results  Component Value Date   INR 1.1 12/05/2020   INR 1.1 04/24/2020   INR 1.1 12/19/2019   No results found for: PTT

## 2021-02-21 NOTE — Progress Notes (Incomplete)
Family Medicine Teaching Service Daily Progress Note Intern Pager: [phone]  Patient name: [name] Medical record number: [id number] Date of birth: May 27, 1951 Age: 70 y.o. 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 -Vascular following, appreciate recs -Continue Lovenox 40mg   -Pain management per vascular-Morphine 2 mg every 4 as needed, Percocet 1 to 2 tablets every 4 as needed -PT/OT  -MiralaxPRNconstipation with pain medication  T2DM withNeuropathy Recent CBGs 114, 116 Home meds: Gabapentin 300mg qh -sSSIwithCBGs TID AC HS -Continuehome gabapentin   Hyponatremia Asymptomatic Na *** today, correction for hyperglycemia ***  -Continue to monitor -AM BMP  HTN SBP 134-152, DBP 55-73 Home medications: benazepril 5mg  daily, nebivolol 20mg  daily  -Monitor Bps -Continue benazepril 5mg  and nebivolol 20 mg daily  HLD Home medications:Lipitor 40mg  qd,Zetia 10mg qd. -ContinueLipitor and Zetia   Iron Deficiency Anemia Hb ***10.6yesterday Home medications 40mg  Protonix. -AM CBC -ContinuehomeProtonix  Tobacco Abuse  -Nicotine patch 7mg daily  Depression Home medication: Prozac 10mg  qd listed, but patient reports he is not taking this. -No medications for now -Continue to monitor mood   FEN/GI:Heart healthy diet Prophylaxis:Lovenox 40mg    Status is: Inpatient  Remains inpatient appropriate because:Inpatient level of care appropriate due to severity of illness  Dispo: The patient is from: Home              Anticipated d/c is to: Home with [facility]              Patient currently is not medically stable to d/c.              Subjective:   ***  Objective: Temp:  [98.6 F (37 C)-98.9 F (37.2 C)] 98.6 F (37 C) (02/27 1500) Pulse Rate:  [75-89] 75 (02/27 1500) Resp:  [18] 18 (02/27 0300) BP: (121-147)/(66-69) 121/66 (02/27 1500) SpO2:  [96 %-98 %] 98 % (02/27 1500) Physical Exam: General: *** Cardiovascular: *** Respiratory: *** Abdomen: *** Extremities: ***  Laboratory: Recent Labs  Lab 02/19/21 1603 02/20/21 0150 02/21/21 0056  WBC 8.4 10.6* 7.9  HGB 10.7* 10.7* 10.6*  HCT 35.1* 34.6* 34.8*  PLT 269 275 267   Recent Labs  Lab 02/19/21 0702 02/20/21 0150 02/21/21 0056  NA 136 132* 129*  K 4.4 3.7 3.4*  CL 99 99 96*  CO2 25 22 24   BUN 10 11 10   CREATININE 0.86 0.75 0.70  CALCIUM 9.5 8.8* 8.4*  PROT 7.4  --   --   BILITOT 0.4  --   --   ALKPHOS 72  --   --   ALT 17  --   --   AST 20  --   --   GLUCOSE 106* 147* 180*    ***  Imaging/Diagnostic Tests: ***  Shary Key, DO 02/21/2021, 9:53 PM PGY-***, [place] Intern pager: [phone], text pages welcome

## 2021-02-21 NOTE — TOC Initial Note (Signed)
Transition of Care [facility]) - Initial/Assessment Note    Patient Details  Name: [name] MRN: [id number] Date of Birth: Sep 01, 1951  Transition of [facility]) CM/SW Contact:    Elliot Gurney [place], [place] Phone Number: [phone] 02/21/2021, 2:14 PM  Clinical Narrative:                 Patient is a 70 year old  male presenting with scheduled left AKA. This Education officer, museum contacted patient by phone to discuss SNF recommendation. Patient declined SNF at this time stating that he has plan to return home with [facility]. Per patient, he wife is available to provide care. Patient states that he has worked with [facility] in the past. Patient is followed by Dr. Caryl Pina. [facility] [address], Patient has a walker at home however is asking for a wheelchair. Patient's provider notified of request.  Transition of Care to continue to follow for discharge needs.  Trenee Igoe, LCSW Clinical Social Worker  [facility] Care Management [phone]   Expected Discharge Plan: [place] Barriers to Discharge: Continued Medical Work up   Patient Goals and CMS Choice Patient states their goals for this hospitalization and ongoing recovery are:: "I would like to go [place] with home health      Expected Discharge Plan and Services Expected Discharge Plan: [place]                                              Prior Living Arrangements/Services                       Activities of Daily Living Home Assistive Devices/Equipment: Eyeglasses,Blood pressure cuff ADL Screening (condition at time of admission) Patient's cognitive ability adequate to safely complete daily activities?: Yes Is the patient deaf or have difficulty hearing?: No Does the patient have difficulty seeing, even when wearing glasses/contacts?: No Does the patient have difficulty concentrating, remembering, or making decisions?: No Patient able  to express need for assistance with ADLs?: Yes Does the patient have difficulty dressing or bathing?: No Independently performs ADLs?: Yes (appropriate for developmental age) Does the patient have difficulty walking or climbing stairs?: Yes Weakness of Legs: Left Weakness of Arms/Hands: None  Permission Sought/Granted                  Emotional Assessment              Admission diagnosis:  Limb ischemia [I99.8] Patient Active Problem List   Diagnosis Date Noted  . Limb ischemia 02/19/2021  . S/P above knee amputation, left ([place]) 02/19/2021  . Symptomatic anemia 12/04/2020  . PAOD (peripheral arterial occlusive disease) ([place]) 04/24/2020  . Adhesive capsulitis 03/10/2020  . Lung nodule < 6cm on CT 01/28/2019  . 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 Depression 10/03/2016  . Diabetic neuropathy ([place]) 02/27/2014  . Iron deficiency anemia 08/01/2012  . OBESITY, NOS 02/22/2007  . Tobacco abuse 02/22/2007  . HYPERTENSION, BENIGN SYSTEMIC 02/22/2007  . PAD (peripheral artery disease) ([place]) 02/22/2007  . Osteoarthritis 02/22/2007   PCP:  Richarda Osmond, DO Pharmacy:   [id number] [place] [place] [address] [place] [address] Phone: [phone] Fax: [phone]  [place], [place] [address] Phone: [phone] Fax: [phone]     Social Determinants of Health (SDOH) Interventions    Readmission Risk Interventions No flowsheet data found.

## 2021-02-21 NOTE — Plan of Care (Signed)

## 2021-02-22 DIAGNOSIS — E1151 Type 2 diabetes mellitus with diabetic peripheral angiopathy without gangrene: Secondary | ICD-10-CM | POA: Diagnosis not present

## 2021-02-22 DIAGNOSIS — I70209 Unspecified atherosclerosis of native arteries of extremities, unspecified extremity: Secondary | ICD-10-CM | POA: Diagnosis not present

## 2021-02-22 DIAGNOSIS — Z89612 Acquired absence of left leg above knee: Secondary | ICD-10-CM | POA: Diagnosis not present

## 2021-02-22 DIAGNOSIS — R531 Weakness: Secondary | ICD-10-CM | POA: Diagnosis not present

## 2021-02-22 DIAGNOSIS — I10 Essential (primary) hypertension: Secondary | ICD-10-CM | POA: Diagnosis not present

## 2021-02-22 LAB — CBC
HCT: 35.4 % — ABNORMAL LOW (ref 39.0–52.0)
Hemoglobin: 10.8 g/dL — ABNORMAL LOW (ref 13.0–17.0)
MCH: 22.3 pg — ABNORMAL LOW (ref 26.0–34.0)
MCHC: 30.5 g/dL (ref 30.0–36.0)
MCV: 73 fL — ABNORMAL LOW (ref 80.0–100.0)
Platelets: 241 10*3/uL (ref 150–400)
RBC: 4.85 MIL/uL (ref 4.22–5.81)
RDW: 19.2 % — ABNORMAL HIGH (ref 11.5–15.5)
WBC: 7.7 10*3/uL (ref 4.0–10.5)
nRBC: 0 % (ref 0.0–0.2)

## 2021-02-22 LAB — GLUCOSE, CAPILLARY
Glucose-Capillary: 118 mg/dL — ABNORMAL HIGH (ref 70–99)
Glucose-Capillary: 155 mg/dL — ABNORMAL HIGH (ref 70–99)
Glucose-Capillary: 110 mg/dL — ABNORMAL HIGH (ref 70–99)

## 2021-02-22 LAB — BASIC METABOLIC PANEL
Anion gap: 11 (ref 5–15)
BUN: 13 mg/dL (ref 8–23)
CO2: 22 mmol/L (ref 22–32)
Calcium: 8.8 mg/dL — ABNORMAL LOW (ref 8.9–10.3)
Chloride: 100 mmol/L (ref 98–111)
Creatinine, Ser: 0.77 mg/dL (ref 0.61–1.24)
GFR, Estimated: >60 mL/min (ref >60)
Glucose, Bld: 140 mg/dL — ABNORMAL HIGH (ref 70–99)
Potassium: 4.1 mmol/L (ref 3.5–5.1)
Sodium: 133 mmol/L — ABNORMAL LOW (ref 135–145)

## 2021-02-22 MED ORDER — POLYETHYLENE GLYCOL 3350 17 G PO PACK: 17.0000 g | PACK | Freq: Every day | ORAL | 0 refills | Status: DC

## 2021-02-22 MED ORDER — SENNA 8.6 MG PO TABS
2.0000 | ORAL_TABLET | Freq: Every day | ORAL | Status: DC
  Administered 2021-02-22: 17.2 mg via ORAL
  Filled 2021-02-22: qty 2

## 2021-02-22 MED ORDER — OXYCODONE-ACETAMINOPHEN 5-325 MG PO TABS: 1.0000 | ORAL_TABLET | ORAL | 0 refills | Status: DC | PRN

## 2021-02-22 MED ORDER — POLYETHYLENE GLYCOL 3350 17 G PO PACK
17.0000 g | PACK | Freq: Two times a day (BID) | ORAL | Status: DC
  Administered 2021-02-22: 17 g via ORAL
  Filled 2021-02-22: qty 1

## 2021-02-22 MED ORDER — SENNA 8.6 MG PO TABS: 1.0000 | ORAL_TABLET | Freq: Every day | ORAL | 0 refills | Status: DC

## 2021-02-22 NOTE — Progress Notes (Addendum)
   Left AKA incision healing well  No erythema or edema in the stump, warm to touch appears viable  POD# 3 left AKA He will f/u with [facility] in 4 weeks for suture and staple removal. Patient requesting to go home, not SNF.   Roxy Horseman PA-C  VASCULAR STAFF ADDENDUM: I agree with the above.   Yevonne Aline. Stanford Breed, MD [facility] Phone Number: [phone] 02/22/2021 10:53 AM

## 2021-02-22 NOTE — Plan of Care (Signed)

## 2021-02-22 NOTE — TOC Transition Note (Signed)
Transition of Care [facility]) - CM/SW Discharge Note   Patient Details  Name: [name] MRN: [id number] Date of Birth: 09-06-1951  Transition of Care [facility]) CM/SW Contact:  Ella Bodo, RN Phone Number: 02/22/2021, 4:59 PM   Clinical Narrative:   Patient medically stable for discharge today; patient is refusing SNF placement, and desires to go home with home health.  He states that his wife is able to provide 24-hour assistance at discharge.  PT/OT recommending follow-up therapies; referral to [facility].  Referral to [place] for recommended DME; 3 and 1 and wheelchair to be delivered to bedside prior to discharge.  Patient states he has a rolling walker at home.    Final next level of care: [place] Barriers to Discharge: Barriers Resolved   Patient Goals and CMS Choice Patient states their goals for this hospitalization and ongoing recovery are:: "I would like to go [place] with home health CMS Medicare.gov Compare Post Acute Care list provided to:: Patient Choice offered to / list presented to : Patient                        Discharge Plan and Services   Discharge Planning Services: CM Consult Post Acute Care Choice: Home Health          DME Arranged: 3-N-1,Wheelchair manual DME Agency: [employer] Date DME Agency Contacted: 02/22/21 Time DME Agency Contacted: 1223 Representative spoke with at DME Agency: [place]: PT,OT [name]: [place] Date [place]: 02/22/21 Time [place]: 1612 Representative spoke with at [place]: [place] ([place]) Interventions     Readmission Risk Interventions Readmission Risk Prevention Plan 02/22/2021  Transportation Screening Complete  PCP or Specialist Appt within 5-7 Days Complete  Home Care Screening Complete  Medication Review (RN CM) Complete  Some recent data might be hidden   Reinaldo Raddle, RN, BSN   Trauma/Neuro ICU Case Manager [phone]

## 2021-02-22 NOTE — Discharge Instructions (Signed)
Dear [name],   Thank you so much for allowing [place] to be part of your care!  You were admitted to [facility] for your left above the knee amputation. We are glad you are doing well after your surgery.    POST-HOSPITAL & CARE INSTRUCTIONS 1. We have sent 7 days of your pain medication to your pharmacy. Please make sure you do not take Tylenol in addition to the Percocet  2. Make sure to follow up with Vein and Vascular Specialist in 4 weeks for suture and staple removal 3. See PCP appointment below to follow up on your hospitalization (3/9 @ 1:30) 4. Please let PCP/Specialists know of any changes that were made.  5. Please see medications section of this packet for any medication changes.   DOCTOR'S APPOINTMENT & FOLLOW UP CARE INSTRUCTIONS  Future Appointments  Date Time Provider [place]  03/23/2021  9:30 AM [facility] [place] [facility]    RETURN PRECAUTIONS:   Take care and be well!  [facility]  [place], [place] [address] [phone]

## 2021-02-22 NOTE — Care Management Important Message (Signed)
Important Message  Patient Details  Name: [name] MRN: [id number] Date of Birth: [birth year]   Medicare Important Message Given:  Yes     Barb Merino Luverne Farone 02/22/2021, 2:39 PM

## 2021-02-22 NOTE — Discharge Summary (Signed)
[facility] Discharge Summary  Patient name: [name] Medical record number: [id number] Date of birth: December 21, 1951 Age: 70 y.o. Gender: male Date of Admission: 02/19/2021  Date of Discharge: 02/22/21 Admitting Physician: Blane Ohara McDiarmid, MD  Primary Care Provider: Richarda Osmond, DO Consultants: Ortho  Indication for Hospitalization: Scheduled left above knee amputation  Discharge Diagnoses/Problem List:  S/p above knee amputation Hypertension  T2DM with neuropathy  Disposition: Home with Home Health   Discharge Condition: Stable   Discharge Exam:   Temp:  [98.1 F (36.7 C)-98.9 F (37.2 C)] 98.1 F (36.7 C) (02/28 0900) Pulse Rate:  [60-83] 60 (02/28 0900) Resp:  [19-20] 20 (02/28 0500) BP: (115-139)/(66-84) 133/76 (02/28 0900) SpO2:  [97 %-98 %] 98 % (02/28 0900)  Physical Exam: General: alert, pleasant, NAD Cardiovascular: RRR no murmurs  Respiratory: CTAB. Normal WOB Abdomen: soft, non distended, non tender  Extremities: L AKA, skin warm and dry, non erythematous without drainage, healing well   Brief Hospital Course:   [name] is a 70 y.o. male presenting with scheduled left AKA. PMH is significant for HTN, HLD, PAD, T2DM, IDA.  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 Noted to have four weeks of pain and worsening ulceration of left 3rd toe with dry gangrene. Duplex performed which showed left femoral to peroneal bypass was occluded.  Left AKA performed on 02/19/2021.  His pain was managed with Morphine and Percocet as needed. Patient continued to do well status post the left AKA and has remained hemodynamically stable.   T2DM with nephropathy: Patient continued on sliding scale insulin with CBGs during hospitalization.  No complications occurred.   HTN: Home medications include benazepril 5 mg  daily and nebivolol 20 mg daily were held on the initial day of his AKA and was restarted the next day with good results.  Blood pressure continued to be appropriate throughout hospitalization.  Issues for Follow Up:  1. Follow up with Vein and Vascular Surgery in 4 weeks for suture and staple removal  2. Follow up with PCP on 3/9 at 1:30. Hgb 10.6-10.8. Consider starting oral iron outpatient. Also ensure patient is not taking Tylenol in addition to his Percocet   Significant Procedures: L above knee amputation   Significant Labs and Imaging:  Recent Labs  Lab 02/20/21 0150 02/21/21 0056 02/22/21 0115  WBC 10.6* 7.9 7.7  HGB 10.7* 10.6* 10.8*  HCT 34.6* 34.8* 35.4*  PLT 275 267 241   Recent Labs  Lab 02/19/21 0702 02/20/21 0150 02/21/21 0056 02/22/21 0115  NA 136 132* 129* 133*  K 4.4 3.7 3.4* 4.1  CL 99 99 96* 100  CO2 25 22 24 22   GLUCOSE 106* 147* 180* 140*  BUN 10 11 10 13   CREATININE 0.86 0.75 0.70 0.77  CALCIUM 9.5 8.8* 8.4* 8.8*  ALKPHOS 72  --   --   --   AST 20  --   --   --   ALT 17  --   --   --   ALBUMIN 3.8  --   --   --      Results/Tests Pending at Time of Discharge: none  Discharge Medications:  Allergies as of 02/22/2021      Reactions   Glipizide Other (See Comments)   REACTION IS SIDE EFFECT Severe hypoglycemia to 40s.  Medication List    STOP taking these medications   acetaminophen 325 MG tablet Commonly known as: TYLENOL   cilostazol 100 MG tablet Commonly known as: PLETAL   FLUoxetine 10 MG capsule Commonly known as: PROZAC     TAKE these medications   atorvastatin 40 MG tablet Commonly known as: LIPITOR TAKE 1 TABLET BY MOUTH ONCE DAILY What changed: when to take this   benazepril 5 MG tablet Commonly known as: LOTENSIN TAKE 1 TABLET BY MOUTH DAILY   diclofenac Sodium 1 % Gel Commonly known as: Voltaren Apply 2 g topically 4 (four) times daily.   ezetimibe 10 MG tablet Commonly known as: ZETIA TAKE 1 TABLET BY  MOUTH ONCE DAILY   gabapentin 300 MG capsule Commonly known as: NEURONTIN TAKE ONE (1) CAPSULE BY MOUTH EVERY NIGHT AT BEDTIME AS NEEDED What changed: See the new instructions.   metFORMIN 1000 MG tablet Commonly known as: GLUCOPHAGE TAKE ONE (1) TABLET BY MOUTH TWICE DAILY WITH MEALS   Nebivolol HCl 20 MG Tabs TAKE 1 TABLET BY MOUTH DAILY   oxyCODONE-acetaminophen 5-325 MG tablet Commonly known as: PERCOCET/ROXICET Take 1 tablet by mouth every 4 (four) hours as needed for moderate pain. What changed: reasons to take this   pantoprazole 40 MG tablet Commonly known as: PROTONIX Take 1 tablet (40 mg total) by mouth daily as needed. What changed: when to take this   polyethylene glycol 17 g packet Commonly known as: MIRALAX / GLYCOLAX Take 17 g by mouth daily.   senna 8.6 MG Tabs tablet Commonly known as: SENOKOT Take 1 tablet (8.6 mg total) by mouth daily.            Durable Medical Equipment  (From admission, onward)         Start     Ordered   02/22/21 0805  For home use only DME 4 wheeled rolling walker with seat  Once       Question:  Patient needs a walker to treat with the following condition  Answer:  Amputation above knee ([place])   02/22/21 0807   02/22/21 0804  For home use only DME 3 n 1  Once        02/22/21 0807   02/21/21 1359  For home use only DME standard manual wheelchair with seat cushion  Once       Comments: Patient suffers from left leg amputation which impairs their ability to perform daily activities like bathing and dressing in the home.  A cane will not resolve issue with performing activities of daily living. A wheelchair will allow patient to safely perform daily activities. Patient can safely propel the wheelchair in the home or has a caregiver who can provide assistance. Length of need Lifetime. Accessories: elevating leg rests (ELRs), wheel locks, extensions and anti-tippers.   02/21/21 1359          Discharge Instructions: Please  refer to Patient Instructions section of EMR for full details.  Patient was counseled important signs and symptoms that should prompt return to medical care, changes in medications, dietary instructions, activity restrictions, and follow up appointments.   Follow-Up Appointments:  Follow-up Information    [place]. Go on 03/03/2021.   Why: Appointment at 130 pm.  Please arrive 15 mins prior. Contact information: [place]       Cherre Robins, MD. Schedule an appointment as soon as possible for a visit in 4 week(s).  Specialties: Vascular Surgery, Interventional Cardiology Contact information: [place] [address] [phone]               Shary Key, DO 02/22/2021, 1:55 PM PGY-1, [place]

## 2021-02-22 NOTE — Progress Notes (Signed)
Physical Therapy Treatment Patient Details Name: [name] MRN: [id number] DOB: 1951/09/10 Today's Date: 02/22/2021    History of Present Illness 70 yo male s/p L AKA on 02/19/21 due to gangrene. PMH includes extensive vascular surgery history (L femoral to peroneal bypass 12/2015; R EIA stent 2018; thrombectomy of L femoral to peroneal bypass and L peroneal artery 12/20; R femoral endarterectomy, profundoplasty with bovine pericardial patch on 03/2020), OA, DM, GERD, HLD, HTN, PAD, ACDF 2013.    PT Comments    Continuing work on functional mobility and activity tolerance;  Session focused on building a repertoire of movement/weight shifting that will give pt options for safe mobiltiy and transfers at home; able to take steps on RLE in room with RW; educated pt on wheelchair propulsion, parts, and management, and he return demonstrated well; Re-emphasized the importance of hip flexor stretching and hip extensor strengthening in prep for a prosthesis; communicated pt's progress with Dr. Arby Barrette  Follow Up Recommendations  Home health PT;Supervision/Assistance - 24 hour     Equipment Recommendations  Rolling walker with 5" wheels;3in1 (PT);Wheelchair (measurements PT);Wheelchair cushion (measurements PT);Other (comment) (consider a drop-arm BSCq)    Recommendations for Other Services       Precautions / Restrictions Precautions Precautions: Fall Restrictions LLE Weight Bearing: Non weight bearing Other Position/Activity Restrictions: L AKA    Mobility  Bed Mobility Overal bed mobility: Needs Assistance Bed Mobility: Supine to Sit     Supine to sit: Supervision     General bed mobility comments: Used bedrails; pushed up to sitting , passing through elbow propping; slow and ineffieient, but not needing physical assist    Transfers Overall transfer level: Needs assistance Equipment used: Rolling walker (2 wheeled) Transfers: Stand Pivot Transfers;Sit to/from Stand;Lateral/Scoot  Transfers Sit to Stand: Min assist Stand pivot transfers: Supervision       General transfer comment: Transferred bed to wheelchair via basic pivot with min assist to brace his R foot in place on the floor; good rise, without th eneed for physical assist; performed sit to stand with 180 deg pivot wc to recliner, and then recliner ot wc; then stood to RW, cues for hand placement  Ambulation/Gait Ambulation/Gait assistance: Min guard (without physical contact) Gait Distance (Feet): 10 Feet Assistive device: Rolling walker (2 wheeled)       General Gait Details: Good, slow, steady steps   Theme park manager mobility: Yes Wheelchair propulsion: Both upper extremities;Right lower extremity Wheelchair parts: Supervision/cueing Distance:  (in-room, including turns and maneuvering) Wheelchair Assistance Details (indicate cue type and reason): Took time to demonstrate wheelchair parts, propulsion, and positioning for transfers; good use of brakes; able to maneuver in room, and manage around obstacles  Modified Rankin (Stroke Patients Only)       Balance     Sitting balance-Leahy Scale: Good       Standing balance-Leahy Scale: Poor                              Cognition Arousal/Alertness: Awake/alert Behavior During Therapy: WFL for tasks assessed/performed Overall Cognitive Status: Within Functional Limits for tasks assessed                                 General Comments: very pleasant, hard-working      Exercises  General Comments General comments (skin integrity, edema, etc.): Pt re-emphasized that his wife can assist at home; we discussed car transfer as well; stressed to pt the importance of stretching hip flexors and strengthening hip extensors      Pertinent Vitals/Pain Pain Assessment: Faces Faces Pain Scale: Hurts a little bit Pain Location: L residual limb Pain  Descriptors / Indicators: Spasm;Tender;Sharp;Shooting Pain Intervention(s): Monitored during session    Home Living                      Prior Function            PT Goals (current goals can now be found in the care plan section) Acute Rehab PT Goals Patient Stated Goal: HOpes to go home today PT Goal Formulation: With patient Time For Goal Achievement: 03/05/21 Potential to Achieve Goals: Good Progress towards PT goals: Progressing toward goals    Frequency    Min 3X/week      PT Plan Current plan remains appropriate    Co-evaluation              AM-PAC PT "6 Clicks" Mobility   Outcome Measure  Help needed turning from your back to your side while in a flat bed without using bedrails?: None Help needed moving from lying on your back to sitting on the side of a flat bed without using bedrails?: None Help needed moving to and from a bed to a chair (including a wheelchair)?: A Little Help needed standing up from a chair using your arms (e.g., wheelchair or bedside chair)?: A Little Help needed to walk in hospital room?: A Little Help needed climbing 3-5 steps with a railing? : A Lot 6 Click Score: 19    End of Session Equipment Utilized During Treatment: Gait belt Activity Tolerance: Patient tolerated treatment well Patient left: Other (comment);with call bell/phone within reach (in wheelchair) Nurse Communication: Mobility status PT Visit Diagnosis: Other abnormalities of gait and mobility (R26.89);Muscle weakness (generalized) (M62.81)     Time: 4098-1191 PT Time Calculation (min) (ACUTE ONLY): 36 min  Charges:  $Gait Training: 8-22 mins $Wheel Chair Management: 8-22 mins                     [name], [place]  Acute Rehabilitation Services Pager [phone] Office [phone]    [name] 02/22/2021, 6:19 PM

## 2021-02-22 NOTE — Progress Notes (Signed)
Discharge package printed and instructions given to patient. Patient verbalizes understanding. 

## 2021-02-22 NOTE — Plan of Care (Signed)
  Problem: Nutrition: Goal: Adequate nutrition will be maintained Outcome: Progressing   Problem: Pain Managment: Goal: General experience of comfort will improve Outcome: Progressing   Problem: Safety: Goal: Ability to remain free from injury will improve Outcome: Progressing   

## 2021-02-23 ENCOUNTER — Telehealth: Payer: Self-pay

## 2021-02-23 LAB — SURGICAL PATHOLOGY

## 2021-02-23 NOTE — Telephone Encounter (Signed)
Pt called with c/o cramping in hands and arms when he tries to move around/exercise. I have advised him to call his PCP. He has left his PCP a message. Pt states he did have this happen in the past and it was more of a muscle spasm and he took something his PCP prescribed for it. I advised him to make sure he is staying well hydrated and to call us back if he needs further assistance. He is awaiting return call from his PCP.

## 2021-02-26 DIAGNOSIS — E1140 Type 2 diabetes mellitus with diabetic neuropathy, unspecified: Secondary | ICD-10-CM | POA: Diagnosis not present

## 2021-02-26 DIAGNOSIS — R911 Solitary pulmonary nodule: Secondary | ICD-10-CM | POA: Diagnosis not present

## 2021-02-26 DIAGNOSIS — M545 Low back pain, unspecified: Secondary | ICD-10-CM | POA: Diagnosis not present

## 2021-02-26 DIAGNOSIS — Z89612 Acquired absence of left leg above knee: Secondary | ICD-10-CM | POA: Diagnosis not present

## 2021-02-26 DIAGNOSIS — E1151 Type 2 diabetes mellitus with diabetic peripheral angiopathy without gangrene: Secondary | ICD-10-CM | POA: Diagnosis not present

## 2021-02-26 DIAGNOSIS — I10 Essential (primary) hypertension: Secondary | ICD-10-CM | POA: Diagnosis not present

## 2021-02-26 DIAGNOSIS — M5412 Radiculopathy, cervical region: Secondary | ICD-10-CM | POA: Diagnosis not present

## 2021-02-26 DIAGNOSIS — M1990 Unspecified osteoarthritis, unspecified site: Secondary | ICD-10-CM | POA: Diagnosis not present

## 2021-02-26 DIAGNOSIS — G8929 Other chronic pain: Secondary | ICD-10-CM | POA: Diagnosis not present

## 2021-02-26 DIAGNOSIS — Z4781 Encounter for orthopedic aftercare following surgical amputation: Secondary | ICD-10-CM | POA: Diagnosis not present

## 2021-02-26 DIAGNOSIS — D509 Iron deficiency anemia, unspecified: Secondary | ICD-10-CM | POA: Diagnosis not present

## 2021-02-28 DIAGNOSIS — D509 Iron deficiency anemia, unspecified: Secondary | ICD-10-CM | POA: Diagnosis not present

## 2021-02-28 DIAGNOSIS — G8929 Other chronic pain: Secondary | ICD-10-CM | POA: Diagnosis not present

## 2021-02-28 DIAGNOSIS — M5412 Radiculopathy, cervical region: Secondary | ICD-10-CM | POA: Diagnosis not present

## 2021-02-28 DIAGNOSIS — M1990 Unspecified osteoarthritis, unspecified site: Secondary | ICD-10-CM | POA: Diagnosis not present

## 2021-02-28 DIAGNOSIS — Z89612 Acquired absence of left leg above knee: Secondary | ICD-10-CM | POA: Diagnosis not present

## 2021-02-28 DIAGNOSIS — I10 Essential (primary) hypertension: Secondary | ICD-10-CM | POA: Diagnosis not present

## 2021-02-28 DIAGNOSIS — Z4781 Encounter for orthopedic aftercare following surgical amputation: Secondary | ICD-10-CM | POA: Diagnosis not present

## 2021-02-28 DIAGNOSIS — M545 Low back pain, unspecified: Secondary | ICD-10-CM | POA: Diagnosis not present

## 2021-02-28 DIAGNOSIS — E1151 Type 2 diabetes mellitus with diabetic peripheral angiopathy without gangrene: Secondary | ICD-10-CM | POA: Diagnosis not present

## 2021-02-28 DIAGNOSIS — E1140 Type 2 diabetes mellitus with diabetic neuropathy, unspecified: Secondary | ICD-10-CM | POA: Diagnosis not present

## 2021-03-02 ENCOUNTER — Telehealth: Payer: Self-pay

## 2021-03-02 DIAGNOSIS — M5412 Radiculopathy, cervical region: Secondary | ICD-10-CM | POA: Diagnosis not present

## 2021-03-02 DIAGNOSIS — Z4781 Encounter for orthopedic aftercare following surgical amputation: Secondary | ICD-10-CM | POA: Diagnosis not present

## 2021-03-02 DIAGNOSIS — M545 Low back pain, unspecified: Secondary | ICD-10-CM | POA: Diagnosis not present

## 2021-03-02 DIAGNOSIS — E1140 Type 2 diabetes mellitus with diabetic neuropathy, unspecified: Secondary | ICD-10-CM | POA: Diagnosis not present

## 2021-03-02 DIAGNOSIS — Z89612 Acquired absence of left leg above knee: Secondary | ICD-10-CM | POA: Diagnosis not present

## 2021-03-02 DIAGNOSIS — D509 Iron deficiency anemia, unspecified: Secondary | ICD-10-CM | POA: Diagnosis not present

## 2021-03-02 DIAGNOSIS — G8929 Other chronic pain: Secondary | ICD-10-CM | POA: Diagnosis not present

## 2021-03-02 DIAGNOSIS — M1990 Unspecified osteoarthritis, unspecified site: Secondary | ICD-10-CM | POA: Diagnosis not present

## 2021-03-02 DIAGNOSIS — I10 Essential (primary) hypertension: Secondary | ICD-10-CM | POA: Diagnosis not present

## 2021-03-02 DIAGNOSIS — E1151 Type 2 diabetes mellitus with diabetic peripheral angiopathy without gangrene: Secondary | ICD-10-CM | POA: Diagnosis not present

## 2021-03-02 NOTE — Progress Notes (Signed)
    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  Patient previously been seen in vascular office on 2/22 for PAD was recommended to have scheduled AKA due to ulceration of his left third toe and dry gangrene.  Patient had this in the hospital.  Hospital course was uncomplicated.  Per discharge instructions patient was recommended follow-up with vein vascular surgery in 4 weeks to have suture and staple removal.  He was recommended to follow-up with [place] today.  Other recommendations include consideration for starting iron oral iron therapy on outpatient due to a hemoglobin of 10.6-10.8 and to ensure that the patient is not taking additional Tylenol as he is taking Percocet for his previous surgery.  PERTINENT  PMH / PSH: Recent AKA  OBJECTIVE:   BP (!) 130/50   Pulse 86   Ht 5' 6.5" (1.689 m)   Wt 158 lb 9.6 oz (71.9 kg)   SpO2 98%   BMI 25.22 kg/m    General: NAD, pleasant, able to participate in exam Cardiac: RRR, no murmurs. Respiratory: CTAB, normal effort, No wheezes, rales or rhonchi Extremities: Left above-knee amputation with clean appearing wound, sutures and staples in place, no drainage/erythema. Skin: warm and dry, no rashes noted Neuro: alert, no obvious focal deficits Psych: Normal affect and mood  ASSESSMENT/PLAN:   S/P above knee amputation, left [facility]) Assessment: 70 year old male status post left above-knee amputation on 2/25 presenting for hospital follow-up.  Wound appears healing appropriately at this time with no erythema, no drainage.  Patient denies fevers.  States he is doing well overall.  He plans to follow-up with vascular at the prescheduled appointment. -He did request getting a neoprene sleeve for his left AKA.  I was unable to find a prescription for this but after speaking with another  physician get him set up to go to a prosthetics Center where he should be able to get 1 of these.  Iron deficiency anemia We will initiate iron 325 daily, patient has had this in the past and sometimes got some constipation with it.  I explained to him that he can space this out to every other day if he needs for GI upset.  Recommended he check his CBC again in about 6 months.     Lurline Del, [place]    This note was prepared using Dragon voice recognition software and may include unintentional dictation errors due to the inherent limitations of voice recognition software.

## 2021-03-02 NOTE — Patient Instructions (Signed)
It was great to see you! Thank you for allowing me to participate in your care!  I recommend that you always bring your medications to each appointment as this makes it easy to ensure we are on the correct medications and helps [place] not miss when refills are needed.  Our plans for today:  -We are starting iron supplementation, this is ordered for each day but you can space it out to every other day if you need for stomach discomfort or constipation. -I recommend that you follow-up in about 6 months to check a CBC -If you develop any drainage from the area, worsening pain, redness, or fevers I would like for you to return sooner.  For the sleeve for your left above-knee amputation I recommend that she reach out to: [facility]. [address]  [phone] -The item you need is called a "shrinker".  Sometimes the need to send a prescription to me to sign in order to for fill it but they should help you find the correct size in.   Take care and seek immediate care sooner if you develop any concerns.   Dr. Lurline Del, [place]

## 2021-03-02 NOTE — Telephone Encounter (Signed)
Michelle from [place] calling for OT verbal orders as follows:  1 time(s) weekly for 5 week(s).   Verbal orders given per [facility] protocol  Talbot Grumbling, RN

## 2021-03-03 ENCOUNTER — Ambulatory Visit (INDEPENDENT_AMBULATORY_CARE_PROVIDER_SITE_OTHER): Payer: Medicare HMO | Admitting: Family Medicine

## 2021-03-03 ENCOUNTER — Telehealth: Payer: Self-pay

## 2021-03-03 ENCOUNTER — Other Ambulatory Visit: Payer: Self-pay | Admitting: Physician Assistant

## 2021-03-03 ENCOUNTER — Encounter: Payer: Self-pay | Admitting: Family Medicine

## 2021-03-03 ENCOUNTER — Other Ambulatory Visit: Payer: Self-pay

## 2021-03-03 DIAGNOSIS — D500 Iron deficiency anemia secondary to blood loss (chronic): Secondary | ICD-10-CM

## 2021-03-03 DIAGNOSIS — Z89612 Acquired absence of left leg above knee: Secondary | ICD-10-CM | POA: Diagnosis not present

## 2021-03-03 MED ORDER — GABAPENTIN 300 MG PO CAPS: 300.0000 mg | ORAL_CAPSULE | Freq: Two times a day (BID) | ORAL | 1 refills | Status: DC

## 2021-03-03 MED ORDER — FERROUS SULFATE 325 (65 FE) MG PO TBEC: 325.0000 mg | DELAYED_RELEASE_TABLET | Freq: Every day | ORAL | 1 refills | Status: DC

## 2021-03-03 MED ORDER — OXYCODONE-ACETAMINOPHEN 5-325 MG PO TABS: 1.0000 | ORAL_TABLET | Freq: Four times a day (QID) | ORAL | 0 refills | Status: DC | PRN

## 2021-03-03 NOTE — Assessment & Plan Note (Addendum)
Assessment: 70 year old male status post left above-knee amputation on 2/25 presenting for hospital follow-up.  Wound appears healing appropriately at this time with no erythema, no drainage.  Patient denies fevers.  States he is doing well overall.  He plans to follow-up with vascular at the prescheduled appointment. -He did request getting a neoprene sleeve for his left AKA.  I was unable to find a prescription for this but after speaking with another physician get him set up to go to a prosthetics Center where he should be able to get 1 of these.

## 2021-03-03 NOTE — Assessment & Plan Note (Signed)
We will initiate iron 325 daily, patient has had this in the past and sometimes got some constipation with it.  I explained to him that he can space this out to every other day if he needs for GI upset.  Recommended he check his CBC again in about 6 months.

## 2021-03-03 NOTE — Telephone Encounter (Signed)
Patient called to report pain in his left stump s/p AKA on 2/25. Per [facility] nurse, wound is healing well and "looks really good". Discussed with PA, sent in #8 oxycodone and gabapentin rx. Advised patient to begin to wean off oxycodone with tylenol and take gabapentin. Patient says "we'll do what we can do"

## 2021-03-04 DIAGNOSIS — M5412 Radiculopathy, cervical region: Secondary | ICD-10-CM | POA: Diagnosis not present

## 2021-03-04 DIAGNOSIS — M1990 Unspecified osteoarthritis, unspecified site: Secondary | ICD-10-CM | POA: Diagnosis not present

## 2021-03-04 DIAGNOSIS — I10 Essential (primary) hypertension: Secondary | ICD-10-CM | POA: Diagnosis not present

## 2021-03-04 DIAGNOSIS — E1140 Type 2 diabetes mellitus with diabetic neuropathy, unspecified: Secondary | ICD-10-CM | POA: Diagnosis not present

## 2021-03-04 DIAGNOSIS — E1151 Type 2 diabetes mellitus with diabetic peripheral angiopathy without gangrene: Secondary | ICD-10-CM | POA: Diagnosis not present

## 2021-03-04 DIAGNOSIS — D509 Iron deficiency anemia, unspecified: Secondary | ICD-10-CM | POA: Diagnosis not present

## 2021-03-04 DIAGNOSIS — G8929 Other chronic pain: Secondary | ICD-10-CM | POA: Diagnosis not present

## 2021-03-04 DIAGNOSIS — Z89612 Acquired absence of left leg above knee: Secondary | ICD-10-CM | POA: Diagnosis not present

## 2021-03-04 DIAGNOSIS — M545 Low back pain, unspecified: Secondary | ICD-10-CM | POA: Diagnosis not present

## 2021-03-04 DIAGNOSIS — Z4781 Encounter for orthopedic aftercare following surgical amputation: Secondary | ICD-10-CM | POA: Diagnosis not present

## 2021-03-05 DIAGNOSIS — M5412 Radiculopathy, cervical region: Secondary | ICD-10-CM | POA: Diagnosis not present

## 2021-03-05 DIAGNOSIS — E1140 Type 2 diabetes mellitus with diabetic neuropathy, unspecified: Secondary | ICD-10-CM | POA: Diagnosis not present

## 2021-03-05 DIAGNOSIS — M1990 Unspecified osteoarthritis, unspecified site: Secondary | ICD-10-CM | POA: Diagnosis not present

## 2021-03-05 DIAGNOSIS — E1151 Type 2 diabetes mellitus with diabetic peripheral angiopathy without gangrene: Secondary | ICD-10-CM | POA: Diagnosis not present

## 2021-03-05 DIAGNOSIS — M545 Low back pain, unspecified: Secondary | ICD-10-CM | POA: Diagnosis not present

## 2021-03-05 DIAGNOSIS — I10 Essential (primary) hypertension: Secondary | ICD-10-CM | POA: Diagnosis not present

## 2021-03-05 DIAGNOSIS — D509 Iron deficiency anemia, unspecified: Secondary | ICD-10-CM | POA: Diagnosis not present

## 2021-03-05 DIAGNOSIS — Z89612 Acquired absence of left leg above knee: Secondary | ICD-10-CM | POA: Diagnosis not present

## 2021-03-05 DIAGNOSIS — G8929 Other chronic pain: Secondary | ICD-10-CM | POA: Diagnosis not present

## 2021-03-05 DIAGNOSIS — Z4781 Encounter for orthopedic aftercare following surgical amputation: Secondary | ICD-10-CM | POA: Diagnosis not present

## 2021-03-10 ENCOUNTER — Other Ambulatory Visit: Payer: Self-pay | Admitting: Student in an Organized Health Care Education/Training Program

## 2021-03-10 DIAGNOSIS — M1990 Unspecified osteoarthritis, unspecified site: Secondary | ICD-10-CM | POA: Diagnosis not present

## 2021-03-10 DIAGNOSIS — Z89612 Acquired absence of left leg above knee: Secondary | ICD-10-CM | POA: Diagnosis not present

## 2021-03-10 DIAGNOSIS — D509 Iron deficiency anemia, unspecified: Secondary | ICD-10-CM | POA: Diagnosis not present

## 2021-03-10 DIAGNOSIS — G8929 Other chronic pain: Secondary | ICD-10-CM | POA: Diagnosis not present

## 2021-03-10 DIAGNOSIS — I10 Essential (primary) hypertension: Secondary | ICD-10-CM | POA: Diagnosis not present

## 2021-03-10 DIAGNOSIS — E1140 Type 2 diabetes mellitus with diabetic neuropathy, unspecified: Secondary | ICD-10-CM | POA: Diagnosis not present

## 2021-03-10 DIAGNOSIS — E1151 Type 2 diabetes mellitus with diabetic peripheral angiopathy without gangrene: Secondary | ICD-10-CM | POA: Diagnosis not present

## 2021-03-10 DIAGNOSIS — M545 Low back pain, unspecified: Secondary | ICD-10-CM | POA: Diagnosis not present

## 2021-03-10 DIAGNOSIS — Z4781 Encounter for orthopedic aftercare following surgical amputation: Secondary | ICD-10-CM | POA: Diagnosis not present

## 2021-03-10 DIAGNOSIS — M5412 Radiculopathy, cervical region: Secondary | ICD-10-CM | POA: Diagnosis not present

## 2021-03-10 NOTE — Progress Notes (Signed)
Ordered shower chair per request of PT

## 2021-03-11 DIAGNOSIS — G8929 Other chronic pain: Secondary | ICD-10-CM | POA: Diagnosis not present

## 2021-03-11 DIAGNOSIS — M5412 Radiculopathy, cervical region: Secondary | ICD-10-CM | POA: Diagnosis not present

## 2021-03-11 DIAGNOSIS — M1990 Unspecified osteoarthritis, unspecified site: Secondary | ICD-10-CM | POA: Diagnosis not present

## 2021-03-11 DIAGNOSIS — E1151 Type 2 diabetes mellitus with diabetic peripheral angiopathy without gangrene: Secondary | ICD-10-CM | POA: Diagnosis not present

## 2021-03-11 DIAGNOSIS — Z4781 Encounter for orthopedic aftercare following surgical amputation: Secondary | ICD-10-CM | POA: Diagnosis not present

## 2021-03-11 DIAGNOSIS — Z89612 Acquired absence of left leg above knee: Secondary | ICD-10-CM | POA: Diagnosis not present

## 2021-03-11 DIAGNOSIS — D509 Iron deficiency anemia, unspecified: Secondary | ICD-10-CM | POA: Diagnosis not present

## 2021-03-11 DIAGNOSIS — I10 Essential (primary) hypertension: Secondary | ICD-10-CM | POA: Diagnosis not present

## 2021-03-11 DIAGNOSIS — M545 Low back pain, unspecified: Secondary | ICD-10-CM | POA: Diagnosis not present

## 2021-03-11 DIAGNOSIS — E1140 Type 2 diabetes mellitus with diabetic neuropathy, unspecified: Secondary | ICD-10-CM | POA: Diagnosis not present

## 2021-03-12 ENCOUNTER — Other Ambulatory Visit: Payer: Self-pay | Admitting: Physician Assistant

## 2021-03-12 ENCOUNTER — Telehealth: Payer: Self-pay

## 2021-03-12 MED ORDER — OXYCODONE-ACETAMINOPHEN 5-325 MG PO TABS: 1.0000 | ORAL_TABLET | Freq: Four times a day (QID) | ORAL | 0 refills | Status: DC | PRN

## 2021-03-12 NOTE — Telephone Encounter (Signed)
Patient is still having pain s/p AKA on 2/25 by TH. Says it feels like phantom pain and gabapentin is not helping. Area is healing well, denies redness, swelling or drainage at incision site. Discussed with PA, called in 12 more oxycodone - advised patient we would not be able to send in more medicine until OV on 3/29. Verbalized understanding.

## 2021-03-16 ENCOUNTER — Ambulatory Visit (INDEPENDENT_AMBULATORY_CARE_PROVIDER_SITE_OTHER): Payer: Medicare HMO | Admitting: Student in an Organized Health Care Education/Training Program

## 2021-03-16 ENCOUNTER — Other Ambulatory Visit: Payer: Self-pay

## 2021-03-16 ENCOUNTER — Encounter: Payer: Self-pay | Admitting: Student in an Organized Health Care Education/Training Program

## 2021-03-16 VITALS — BP 140/70 | HR 69 | Ht 67.0 in | Wt 159.7 lb

## 2021-03-16 DIAGNOSIS — Z89612 Acquired absence of left leg above knee: Secondary | ICD-10-CM

## 2021-03-16 NOTE — Assessment & Plan Note (Signed)
Left leg. Patient experiencing gas pains of his lisinopril. Incision site seems to be feeling well. Patient is pursuing prosthesis and requesting an electric wheelchair at this time. -Continue with home PT -Refer to neuro rehab for wheelchair evaluation for as assistance with phantom limb pain -Increased gabapentin from 300 mg twice daily to 600 mg at nighttime and instructed patient he could increase to 900 mg at night after a week if not seeing benefit from the initial increase. -Call or return if having worsening of symptoms -Follow-up with surgeon

## 2021-03-16 NOTE — Patient Instructions (Signed)
It was a pleasure to see you today!  To summarize our discussion for this visit:  I have put in a referral for neuro rehab which will be the place to complete the wheelchair evaluation and I think would be a good place for you to address your residual pain.  Follow up with your surgeon for suture removal.   We can increase your gabapentin dose. Start with 600mg  nightly for a week to see if that improves your symptoms. If not, you can increase to 900mg  nightly. I'd like to hear from you on how you feel with this adjustment and if we need to try something else.  Some additional health maintenance measures we should update are: Health Maintenance Due  Topic Date Due  . FOOT EXAM  01/19/2020  . OPHTHALMOLOGY EXAM  11/25/2020  .    Call the clinic at [phone] if your symptoms worsen or you have any concerns.   Thank you for allowing me to take part in your care,  Dr. Doristine Mango

## 2021-03-16 NOTE — Progress Notes (Signed)
    SUBJECTIVE:   CHIEF COMPLAINT / HPI: request for wheelchair  S/p AKA left. Patient has been doing well overall since surgery.  He of course, is still adjusting to you life at his last lower leg.  His incision has failed healing well and minimal. however, patient endorses having phantom limb pain which is significant and interfering with sleep at night.  He has been working with PT at home and this is going well.  He is taking oxycodone and gabapentin for pain with minimal relief. He continues to try to cut back on smoking.  His wife is here today and endorses that they are both trying to cut back. He has follow-up with his surgeon in 1 week with a removed staples and the sutures from his incision site.  OBJECTIVE:   BP 140/70   Pulse 69   Ht 5\' 7"  (1.702 m)   Wt 159 lb 11.2 oz (72.4 kg)   SpO2 99%   BMI 25.01 kg/m   Physical Exam Vitals and nursing note reviewed. Exam conducted with a chaperone present.  Constitutional:      Appearance: Normal appearance. He is not ill-appearing.  Musculoskeletal:     Left Lower Extremity: Left leg is amputated above knee. (Incision site appears to be healing well without erythema, edema.  Both interrupted sutures and staples are in place) Neurological:     Mental Status: He is alert.    ASSESSMENT/PLAN:   S/P AKA (above knee amputation) (HCC) Left leg. Patient experiencing gas pains of his lisinopril. Incision site seems to be feeling well. Patient is pursuing prosthesis and requesting an electric wheelchair at this time. -Continue with home PT -Refer to neuro rehab for wheelchair evaluation for as assistance with phantom limb pain -Increased gabapentin from 300 mg twice daily to 600 mg at nighttime and instructed patient he could increase to 900 mg at night after a week if not seeing benefit from the initial increase. -Call or return if having worsening of symptoms -Follow-up with surgeon     Richarda Osmond, [place]

## 2021-03-17 DIAGNOSIS — D509 Iron deficiency anemia, unspecified: Secondary | ICD-10-CM | POA: Diagnosis not present

## 2021-03-17 DIAGNOSIS — G8929 Other chronic pain: Secondary | ICD-10-CM | POA: Diagnosis not present

## 2021-03-17 DIAGNOSIS — E1151 Type 2 diabetes mellitus with diabetic peripheral angiopathy without gangrene: Secondary | ICD-10-CM | POA: Diagnosis not present

## 2021-03-17 DIAGNOSIS — Z89612 Acquired absence of left leg above knee: Secondary | ICD-10-CM | POA: Diagnosis not present

## 2021-03-17 DIAGNOSIS — M1990 Unspecified osteoarthritis, unspecified site: Secondary | ICD-10-CM | POA: Diagnosis not present

## 2021-03-17 DIAGNOSIS — E1140 Type 2 diabetes mellitus with diabetic neuropathy, unspecified: Secondary | ICD-10-CM | POA: Diagnosis not present

## 2021-03-17 DIAGNOSIS — Z4781 Encounter for orthopedic aftercare following surgical amputation: Secondary | ICD-10-CM | POA: Diagnosis not present

## 2021-03-17 DIAGNOSIS — I10 Essential (primary) hypertension: Secondary | ICD-10-CM | POA: Diagnosis not present

## 2021-03-17 DIAGNOSIS — M545 Low back pain, unspecified: Secondary | ICD-10-CM | POA: Diagnosis not present

## 2021-03-17 DIAGNOSIS — M5412 Radiculopathy, cervical region: Secondary | ICD-10-CM | POA: Diagnosis not present

## 2021-03-18 DIAGNOSIS — Z89612 Acquired absence of left leg above knee: Secondary | ICD-10-CM | POA: Diagnosis not present

## 2021-03-18 DIAGNOSIS — G8929 Other chronic pain: Secondary | ICD-10-CM | POA: Diagnosis not present

## 2021-03-18 DIAGNOSIS — I10 Essential (primary) hypertension: Secondary | ICD-10-CM | POA: Diagnosis not present

## 2021-03-18 DIAGNOSIS — M545 Low back pain, unspecified: Secondary | ICD-10-CM | POA: Diagnosis not present

## 2021-03-18 DIAGNOSIS — M1990 Unspecified osteoarthritis, unspecified site: Secondary | ICD-10-CM | POA: Diagnosis not present

## 2021-03-18 DIAGNOSIS — Z4781 Encounter for orthopedic aftercare following surgical amputation: Secondary | ICD-10-CM | POA: Diagnosis not present

## 2021-03-18 DIAGNOSIS — D509 Iron deficiency anemia, unspecified: Secondary | ICD-10-CM | POA: Diagnosis not present

## 2021-03-18 DIAGNOSIS — E1151 Type 2 diabetes mellitus with diabetic peripheral angiopathy without gangrene: Secondary | ICD-10-CM | POA: Diagnosis not present

## 2021-03-18 DIAGNOSIS — E1140 Type 2 diabetes mellitus with diabetic neuropathy, unspecified: Secondary | ICD-10-CM | POA: Diagnosis not present

## 2021-03-18 DIAGNOSIS — M5412 Radiculopathy, cervical region: Secondary | ICD-10-CM | POA: Diagnosis not present

## 2021-03-23 ENCOUNTER — Ambulatory Visit (INDEPENDENT_AMBULATORY_CARE_PROVIDER_SITE_OTHER): Payer: Medicare HMO | Admitting: Physician Assistant

## 2021-03-23 ENCOUNTER — Other Ambulatory Visit: Payer: Self-pay

## 2021-03-23 VITALS — BP 171/82 | HR 72 | Temp 98.0°F | Resp 20 | Ht 67.0 in | Wt 159.7 lb

## 2021-03-23 DIAGNOSIS — I739 Peripheral vascular disease, unspecified: Secondary | ICD-10-CM

## 2021-03-23 DIAGNOSIS — E1140 Type 2 diabetes mellitus with diabetic neuropathy, unspecified: Secondary | ICD-10-CM | POA: Diagnosis not present

## 2021-03-23 DIAGNOSIS — M5412 Radiculopathy, cervical region: Secondary | ICD-10-CM | POA: Diagnosis not present

## 2021-03-23 DIAGNOSIS — M545 Low back pain, unspecified: Secondary | ICD-10-CM | POA: Diagnosis not present

## 2021-03-23 DIAGNOSIS — Z4781 Encounter for orthopedic aftercare following surgical amputation: Secondary | ICD-10-CM | POA: Diagnosis not present

## 2021-03-23 DIAGNOSIS — Z89612 Acquired absence of left leg above knee: Secondary | ICD-10-CM | POA: Diagnosis not present

## 2021-03-23 DIAGNOSIS — G8929 Other chronic pain: Secondary | ICD-10-CM | POA: Diagnosis not present

## 2021-03-23 DIAGNOSIS — M1990 Unspecified osteoarthritis, unspecified site: Secondary | ICD-10-CM | POA: Diagnosis not present

## 2021-03-23 DIAGNOSIS — D509 Iron deficiency anemia, unspecified: Secondary | ICD-10-CM | POA: Diagnosis not present

## 2021-03-23 DIAGNOSIS — E1151 Type 2 diabetes mellitus with diabetic peripheral angiopathy without gangrene: Secondary | ICD-10-CM | POA: Diagnosis not present

## 2021-03-23 DIAGNOSIS — I10 Essential (primary) hypertension: Secondary | ICD-10-CM | POA: Diagnosis not present

## 2021-03-23 MED ORDER — OXYCODONE-ACETAMINOPHEN 5-325 MG PO TABS: 1.0000 | ORAL_TABLET | Freq: Four times a day (QID) | ORAL | 0 refills | Status: DC | PRN

## 2021-03-23 NOTE — Progress Notes (Signed)
    Postoperative Visit    History of Present Illness   [name] is a 71 y.o. male who presents for postoperative follow-up for: left above-the-knee ampuation. By Dr. Stanford Breed (Date: 02/19/21).  The patient's wounds are healed.  The patient notes pain is well controlled.    For VQI Use Only   PRE-ADM LIVING: Home  AMB STATUS: Ambulatory with Assistance   Physical Examination   Vitals:   03/23/21 0916  BP: (!) 171/82  Pulse: 72  Resp: 20  Temp: 98 F (36.7 C)  SpO2: 99%    LLE: Stump incision is healing well.  Staples are intact.   Medical Decision Making   [name] is a 70 y.o. male who presents s/p left above-the-knee amputation.   L AKA incision seems to be healing well  Sutures removed today  Prescription provided for Percocet  Incision check in 1 week to remove staples  Dagoberto Ligas PA-C [facility] [place] Office: [phone]  Clinic MD: Stanford Breed

## 2021-03-24 DIAGNOSIS — Z89612 Acquired absence of left leg above knee: Secondary | ICD-10-CM | POA: Diagnosis not present

## 2021-03-24 DIAGNOSIS — M545 Low back pain, unspecified: Secondary | ICD-10-CM | POA: Diagnosis not present

## 2021-03-24 DIAGNOSIS — E1140 Type 2 diabetes mellitus with diabetic neuropathy, unspecified: Secondary | ICD-10-CM | POA: Diagnosis not present

## 2021-03-24 DIAGNOSIS — I10 Essential (primary) hypertension: Secondary | ICD-10-CM | POA: Diagnosis not present

## 2021-03-24 DIAGNOSIS — M5412 Radiculopathy, cervical region: Secondary | ICD-10-CM | POA: Diagnosis not present

## 2021-03-24 DIAGNOSIS — G8929 Other chronic pain: Secondary | ICD-10-CM | POA: Diagnosis not present

## 2021-03-24 DIAGNOSIS — Z4781 Encounter for orthopedic aftercare following surgical amputation: Secondary | ICD-10-CM | POA: Diagnosis not present

## 2021-03-24 DIAGNOSIS — M1990 Unspecified osteoarthritis, unspecified site: Secondary | ICD-10-CM | POA: Diagnosis not present

## 2021-03-24 DIAGNOSIS — E1151 Type 2 diabetes mellitus with diabetic peripheral angiopathy without gangrene: Secondary | ICD-10-CM | POA: Diagnosis not present

## 2021-03-24 DIAGNOSIS — D509 Iron deficiency anemia, unspecified: Secondary | ICD-10-CM | POA: Diagnosis not present

## 2021-03-25 ENCOUNTER — Other Ambulatory Visit: Payer: Self-pay | Admitting: Student in an Organized Health Care Education/Training Program

## 2021-03-26 DIAGNOSIS — Z89612 Acquired absence of left leg above knee: Secondary | ICD-10-CM | POA: Diagnosis not present

## 2021-03-26 DIAGNOSIS — R531 Weakness: Secondary | ICD-10-CM | POA: Diagnosis not present

## 2021-03-30 DIAGNOSIS — R1033 Periumbilical pain: Secondary | ICD-10-CM | POA: Insufficient documentation

## 2021-03-30 DIAGNOSIS — Z860100 Personal history of colon polyps, unspecified: Secondary | ICD-10-CM | POA: Insufficient documentation

## 2021-03-30 DIAGNOSIS — R143 Flatulence: Secondary | ICD-10-CM | POA: Insufficient documentation

## 2021-03-30 DIAGNOSIS — K5900 Constipation, unspecified: Secondary | ICD-10-CM | POA: Insufficient documentation

## 2021-03-30 DIAGNOSIS — R1011 Right upper quadrant pain: Secondary | ICD-10-CM | POA: Insufficient documentation

## 2021-03-30 DIAGNOSIS — R635 Abnormal weight gain: Secondary | ICD-10-CM | POA: Insufficient documentation

## 2021-03-30 DIAGNOSIS — R1310 Dysphagia, unspecified: Secondary | ICD-10-CM | POA: Insufficient documentation

## 2021-03-30 DIAGNOSIS — K219 Gastro-esophageal reflux disease without esophagitis: Secondary | ICD-10-CM | POA: Insufficient documentation

## 2021-03-30 DIAGNOSIS — K6289 Other specified diseases of anus and rectum: Secondary | ICD-10-CM | POA: Insufficient documentation

## 2021-03-30 DIAGNOSIS — K602 Anal fissure, unspecified: Secondary | ICD-10-CM | POA: Insufficient documentation

## 2021-03-30 DIAGNOSIS — R933 Abnormal findings on diagnostic imaging of other parts of digestive tract: Secondary | ICD-10-CM | POA: Insufficient documentation

## 2021-03-30 DIAGNOSIS — K760 Fatty (change of) liver, not elsewhere classified: Secondary | ICD-10-CM | POA: Insufficient documentation

## 2021-03-30 DIAGNOSIS — Z1211 Encounter for screening for malignant neoplasm of colon: Secondary | ICD-10-CM | POA: Insufficient documentation

## 2021-03-30 DIAGNOSIS — K921 Melena: Secondary | ICD-10-CM | POA: Insufficient documentation

## 2021-03-30 DIAGNOSIS — K5903 Drug induced constipation: Secondary | ICD-10-CM | POA: Insufficient documentation

## 2021-03-30 DIAGNOSIS — R141 Gas pain: Secondary | ICD-10-CM | POA: Insufficient documentation

## 2021-03-30 HISTORY — DX: Abnormal findings on diagnostic imaging of other parts of digestive tract: R93.3

## 2021-03-31 ENCOUNTER — Ambulatory Visit: Payer: Medicare HMO | Admitting: Orthopaedic Surgery

## 2021-03-31 DIAGNOSIS — M5412 Radiculopathy, cervical region: Secondary | ICD-10-CM | POA: Diagnosis not present

## 2021-03-31 DIAGNOSIS — E1151 Type 2 diabetes mellitus with diabetic peripheral angiopathy without gangrene: Secondary | ICD-10-CM | POA: Diagnosis not present

## 2021-03-31 DIAGNOSIS — I10 Essential (primary) hypertension: Secondary | ICD-10-CM | POA: Diagnosis not present

## 2021-03-31 DIAGNOSIS — D509 Iron deficiency anemia, unspecified: Secondary | ICD-10-CM | POA: Diagnosis not present

## 2021-03-31 DIAGNOSIS — G8929 Other chronic pain: Secondary | ICD-10-CM | POA: Diagnosis not present

## 2021-03-31 DIAGNOSIS — M545 Low back pain, unspecified: Secondary | ICD-10-CM | POA: Diagnosis not present

## 2021-03-31 DIAGNOSIS — Z4781 Encounter for orthopedic aftercare following surgical amputation: Secondary | ICD-10-CM | POA: Diagnosis not present

## 2021-03-31 DIAGNOSIS — E1140 Type 2 diabetes mellitus with diabetic neuropathy, unspecified: Secondary | ICD-10-CM | POA: Diagnosis not present

## 2021-03-31 DIAGNOSIS — M1990 Unspecified osteoarthritis, unspecified site: Secondary | ICD-10-CM | POA: Diagnosis not present

## 2021-03-31 DIAGNOSIS — Z89612 Acquired absence of left leg above knee: Secondary | ICD-10-CM | POA: Diagnosis not present

## 2021-04-01 ENCOUNTER — Encounter: Payer: Self-pay | Admitting: Physician Assistant

## 2021-04-01 ENCOUNTER — Other Ambulatory Visit: Payer: Self-pay

## 2021-04-01 ENCOUNTER — Ambulatory Visit (INDEPENDENT_AMBULATORY_CARE_PROVIDER_SITE_OTHER): Payer: Medicare HMO | Admitting: Physician Assistant

## 2021-04-01 VITALS — BP 126/71 | HR 79 | Temp 98.6°F | Resp 20 | Ht 67.0 in

## 2021-04-01 DIAGNOSIS — E1140 Type 2 diabetes mellitus with diabetic neuropathy, unspecified: Secondary | ICD-10-CM | POA: Diagnosis not present

## 2021-04-01 DIAGNOSIS — I739 Peripheral vascular disease, unspecified: Secondary | ICD-10-CM

## 2021-04-01 DIAGNOSIS — Z89612 Acquired absence of left leg above knee: Secondary | ICD-10-CM | POA: Diagnosis not present

## 2021-04-01 DIAGNOSIS — M1990 Unspecified osteoarthritis, unspecified site: Secondary | ICD-10-CM | POA: Diagnosis not present

## 2021-04-01 DIAGNOSIS — E1151 Type 2 diabetes mellitus with diabetic peripheral angiopathy without gangrene: Secondary | ICD-10-CM | POA: Diagnosis not present

## 2021-04-01 DIAGNOSIS — G8929 Other chronic pain: Secondary | ICD-10-CM | POA: Diagnosis not present

## 2021-04-01 DIAGNOSIS — D509 Iron deficiency anemia, unspecified: Secondary | ICD-10-CM | POA: Diagnosis not present

## 2021-04-01 DIAGNOSIS — Z4781 Encounter for orthopedic aftercare following surgical amputation: Secondary | ICD-10-CM | POA: Diagnosis not present

## 2021-04-01 DIAGNOSIS — M545 Low back pain, unspecified: Secondary | ICD-10-CM | POA: Diagnosis not present

## 2021-04-01 DIAGNOSIS — I10 Essential (primary) hypertension: Secondary | ICD-10-CM | POA: Diagnosis not present

## 2021-04-01 DIAGNOSIS — M5412 Radiculopathy, cervical region: Secondary | ICD-10-CM | POA: Diagnosis not present

## 2021-04-01 NOTE — Progress Notes (Signed)
  POST OPERATIVE OFFICE NOTE    CC:  F/u for surgery  HPI:  This is a 70 y.o. male who is s/p  left above-the-knee ampuation. By Dr. Stanford Breed (Date: 02/19/21).  6 months ago he had right femoral endarterectomy by Dr. Carlis Abbott for ischemic rest pain.  He has also had a previous right EIA stent with 7 mm x 29 mm VBX in 2018 by Dr. Scot Dock.  Pt returns today for follow up and staple removal.  Pt states the left AKA has being doing fine with no pain issues.  Allergies  Allergen Reactions  . Glipizide Other (See Comments)    REACTION IS SIDE EFFECT Severe hypoglycemia to 40s.     Current Outpatient Medications  Medication Sig Dispense Refill  . atorvastatin (LIPITOR) 40 MG tablet TAKE 1 TABLET BY MOUTH ONCE DAILY (Patient taking differently: Take 40 mg by mouth at bedtime.) 90 tablet 2  . benazepril (LOTENSIN) 5 MG tablet TAKE 1 TABLET BY MOUTH DAILY (Patient taking differently: Take 5 mg by mouth daily.) 90 tablet 0  . clopidogrel (PLAVIX) 75 MG tablet Take 75 mg by mouth daily.    . diclofenac Sodium (VOLTAREN) 1 % GEL Apply 2 g topically 4 (four) times daily. 50 g 1  . ezetimibe (ZETIA) 10 MG tablet TAKE 1 TABLET BY MOUTH ONCE DAILY (Patient taking differently: Take 10 mg by mouth daily.) 90 tablet 1  . ferrous sulfate 325 (65 FE) MG EC tablet Take 1 tablet (325 mg total) by mouth daily with breakfast. 90 tablet 1  . FLUoxetine (PROZAC) 10 MG capsule Take 10 mg by mouth daily.    Marland Kitchen gabapentin (NEURONTIN) 300 MG capsule Take 1 capsule (300 mg total) by mouth 2 (two) times daily. 60 capsule 1  . metFORMIN (GLUCOPHAGE) 1000 MG tablet TAKE ONE (1) TABLET BY MOUTH TWICE DAILY WITH MEALS 90 tablet 1  . Nebivolol HCl 20 MG TABS TAKE 1 TABLET BY MOUTH DAILY (Patient taking differently: Take 20 mg by mouth daily.) 90 tablet 0  . oxyCODONE-acetaminophen (PERCOCET/ROXICET) 5-325 MG tablet Take 1 tablet by mouth every 6 (six) hours as needed for moderate pain. 30 tablet 0  . pantoprazole (PROTONIX) 40 MG  tablet Take 1 tablet (40 mg total) by mouth daily as needed. (Patient taking differently: Take 40 mg by mouth daily.) 90 tablet 0  . polyethylene glycol (MIRALAX / GLYCOLAX) 17 g packet Take 17 g by mouth daily. 14 each 0  . senna (SENOKOT) 8.6 MG TABS tablet Take 1 tablet (8.6 mg total) by mouth daily. 30 tablet 0   No current facility-administered medications for this visit.     ROS:  See HPI  Physical Exam:    Incision:  Well healed Extremities:  Left AKA is warm to touch, NTTP.  Staples were removed and patient tolerated this well.  Right LE without ischemic changes, no open wounds and warm to touch.      Assessment/Plan:  This is a 70 y.o. male who is s/p:4 weeks s/p left AKA Viable left AKA with known B LE PAD and multiple interventions.  He will be scheduled for ABI on the right LE and we will cont. To follow him.  He was given a prescription for [employer] to start moving towards a prosthetic.     Roxy Horseman PA-C [facility] [phone]   Clinic MD:  Oneida Alar

## 2021-04-02 ENCOUNTER — Other Ambulatory Visit: Payer: Self-pay

## 2021-04-02 DIAGNOSIS — I10 Essential (primary) hypertension: Secondary | ICD-10-CM | POA: Diagnosis not present

## 2021-04-02 DIAGNOSIS — Z89612 Acquired absence of left leg above knee: Secondary | ICD-10-CM | POA: Diagnosis not present

## 2021-04-02 DIAGNOSIS — Z720 Tobacco use: Secondary | ICD-10-CM | POA: Diagnosis not present

## 2021-04-02 DIAGNOSIS — R69 Illness, unspecified: Secondary | ICD-10-CM | POA: Diagnosis not present

## 2021-04-02 DIAGNOSIS — Z8249 Family history of ischemic heart disease and other diseases of the circulatory system: Secondary | ICD-10-CM | POA: Diagnosis not present

## 2021-04-02 DIAGNOSIS — K219 Gastro-esophageal reflux disease without esophagitis: Secondary | ICD-10-CM | POA: Diagnosis not present

## 2021-04-02 DIAGNOSIS — E785 Hyperlipidemia, unspecified: Secondary | ICD-10-CM | POA: Diagnosis not present

## 2021-04-02 DIAGNOSIS — Z7984 Long term (current) use of oral hypoglycemic drugs: Secondary | ICD-10-CM | POA: Diagnosis not present

## 2021-04-02 DIAGNOSIS — I739 Peripheral vascular disease, unspecified: Secondary | ICD-10-CM

## 2021-04-02 DIAGNOSIS — E1142 Type 2 diabetes mellitus with diabetic polyneuropathy: Secondary | ICD-10-CM | POA: Diagnosis not present

## 2021-04-05 DIAGNOSIS — M5412 Radiculopathy, cervical region: Secondary | ICD-10-CM | POA: Diagnosis not present

## 2021-04-05 DIAGNOSIS — Z89612 Acquired absence of left leg above knee: Secondary | ICD-10-CM | POA: Diagnosis not present

## 2021-04-05 DIAGNOSIS — M545 Low back pain, unspecified: Secondary | ICD-10-CM | POA: Diagnosis not present

## 2021-04-05 DIAGNOSIS — Z4781 Encounter for orthopedic aftercare following surgical amputation: Secondary | ICD-10-CM | POA: Diagnosis not present

## 2021-04-05 DIAGNOSIS — G8929 Other chronic pain: Secondary | ICD-10-CM | POA: Diagnosis not present

## 2021-04-05 DIAGNOSIS — E1151 Type 2 diabetes mellitus with diabetic peripheral angiopathy without gangrene: Secondary | ICD-10-CM | POA: Diagnosis not present

## 2021-04-05 DIAGNOSIS — D509 Iron deficiency anemia, unspecified: Secondary | ICD-10-CM | POA: Diagnosis not present

## 2021-04-05 DIAGNOSIS — M1990 Unspecified osteoarthritis, unspecified site: Secondary | ICD-10-CM | POA: Diagnosis not present

## 2021-04-05 DIAGNOSIS — E1140 Type 2 diabetes mellitus with diabetic neuropathy, unspecified: Secondary | ICD-10-CM | POA: Diagnosis not present

## 2021-04-05 DIAGNOSIS — I10 Essential (primary) hypertension: Secondary | ICD-10-CM | POA: Diagnosis not present

## 2021-04-07 ENCOUNTER — Ambulatory Visit (INDEPENDENT_AMBULATORY_CARE_PROVIDER_SITE_OTHER): Payer: Medicare HMO

## 2021-04-07 ENCOUNTER — Other Ambulatory Visit: Payer: Self-pay

## 2021-04-07 ENCOUNTER — Ambulatory Visit: Payer: Self-pay

## 2021-04-07 ENCOUNTER — Encounter: Payer: Self-pay | Admitting: Orthopaedic Surgery

## 2021-04-07 ENCOUNTER — Ambulatory Visit (INDEPENDENT_AMBULATORY_CARE_PROVIDER_SITE_OTHER): Payer: Medicare HMO | Admitting: Orthopaedic Surgery

## 2021-04-07 DIAGNOSIS — G8929 Other chronic pain: Secondary | ICD-10-CM

## 2021-04-07 DIAGNOSIS — M25512 Pain in left shoulder: Secondary | ICD-10-CM

## 2021-04-07 NOTE — Progress Notes (Signed)
Subjective: He is here for ultrasound-guided left long head biceps tendon injection.  Objective: He is point tender in the anterior shoulder.  Limited diagnostic ultrasound: The long head biceps tendon was viewed and is located in its groove.  Palpation over this does not recreate his pain.  His pain is more medial, directly over the subscapularis tendon which has a small partial tear.  Procedure: Ultrasound-guided left subscapularis tendon injection: After sterile prep with Betadine, injected 3 cc 0.25% bupivacaine and 40 mg Depo-Medrol into the subscapularis tendon at the partial tear.  He had very good relief during the anesthetic phase.

## 2021-04-07 NOTE — Progress Notes (Signed)
Office Visit Note   Patient: [name]           Date of Birth: June 02, 1951           MRN: [id number] Visit Date: 04/07/2021              Requested by: Anderson, Chelsey L, DO [place],  [place] [id number] PCP: Richarda Osmond, DO   Assessment & Plan: Visit Diagnoses:  1. Chronic left shoulder pain     Plan: Impression is left shoulder biceps tendinitis.  We have referred the patient to Dr. Junius Roads for ultrasound-guided biceps tendon sheath injection.  He will follow up with [place] as needed.  Follow-Up Instructions: Return if symptoms worsen or fail to improve.   Orders:  Orders Placed This Encounter  Procedures  . XR Shoulder Left  . US Guided Needle Placement - No Linked Charges   No orders of the defined types were placed in this encounter.     Procedures: No procedures performed   Clinical Data: No additional findings.   Subjective: Chief Complaint  Patient presents with  . Left Shoulder - Pain    HPI patient is a very pleasant 70 year old gentleman who comes in today with recurrent left shoulder pain.  He was seen by [place] in the fall 2021 for his left shoulder.  He had previously undergone left shoulder glenohumeral cortisone injection by physiatrist.  This did help temporarily.  When he was seen by [place], we injected the subacromial space with cortisone which helped for about 3 months.  Prior to the onset of the new symptoms, he underwent a left BKA.  He has been in a wheelchair and walker since which he thinks is aggravated his shoulder.  The majority of his pain is to the anterior aspect.  Lifting his arm and using his wheelchair seems to aggravate this most.  He has been taking Tylenol without significant relief.  He denies any paresthesias to the left upper extremity.  Review of Systems as detailed in HPI.  All others reviewed and negative.   Objective: Vital Signs: There were no vitals taken for this visit.  Physical Exam well-developed  and well-nourished gentleman in no acute distress.  Alert oriented x3.  Ortho Exam left shoulder exam shows forward flexion to 90 degrees.  Near full external rotation.  Internal rotation to L5.  Negative empty can and cross body adduction.  No tenderness to the [facility] joint.  Negative belly press.  Positive speeds test.  He is neurovascular intact distally.  Specialty Comments:  No specialty comments available.  Imaging: XR Shoulder Left  Result Date: 04/07/2021 X-rays demonstrate mild glenohumeral changes with periarticular osteophytes.  Moderate degenerative changes the AC joint.  No superior migration humeral head.    PMFS History: Patient Active Problem List   Diagnosis Date Noted  . Abnormal weight gain 03/30/2021  . Anal fissure 03/30/2021  . Fatty liver 03/30/2021  . Flatulence, eructation and gas pain 03/30/2021  . Hematochezia 03/30/2021  . Imaging of gastrointestinal tract abnormal 03/30/2021  . Personal history of colonic polyps 03/30/2021  . Rectal pain 03/30/2021  . Constipation 03/30/2021  . Drug-induced constipation 03/30/2021  . Dysphagia 03/30/2021  . Colon cancer screening 03/30/2021  . Periumbilical pain 70/35/0093  . Right upper quadrant pain 03/30/2021  . Gastroesophageal reflux disease 03/30/2021  . Limb ischemia 02/19/2021  . S/P AKA (above knee amputation) ([place]) 02/19/2021  . Symptomatic anemia 12/04/2020  . PAOD (peripheral arterial occlusive  disease) ([place]) 04/24/2020  . Adhesive capsulitis 03/10/2020  . Lung nodule < 6cm on CT 01/28/2019  . 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 Depression 10/03/2016  . Diabetic neuropathy ([place]) 02/27/2014  . Iron deficiency anemia 08/01/2012  . OBESITY, NOS 02/22/2007  . Tobacco abuse 02/22/2007  . HYPERTENSION, BENIGN SYSTEMIC 02/22/2007  . PAD (peripheral artery disease) ([place]) 02/22/2007  . Osteoarthritis 02/22/2007   Past Medical History:  Diagnosis Date   . Adhesive capsulitis 03/10/2020  . Anemia   . Angiodysplasia of small intestine ([place]) 03/29/2018   Enteroscopy was significant for angiodysplasia 03/29/2018  . Arthritis    OA  . Cervical radiculopathy    Dr. Vertell Limber neurosurgery  . Chronic lower back pain   . Claudication of both lower extremities ([place]) 07/15/2015  . Critical lower limb ischemia ([place]) 12/19/2019  . Diabetes mellitus    takes Metformin daily  . GERD (gastroesophageal reflux disease)    takes Protonix daily  . History of blood transfusion    "related to low HgB" ((09/10/2015  . Hyperlipidemia    takes Vytorin daily  . Hypertension    takes Benazepril and Bystolic daily  . PAD (peripheral artery disease) ([place])   . Pneumonia   . Radiculopathy, cervical region 02/11/2017  . Shortness of breath dyspnea    with exertion  . Tobacco user   . Toe fracture, right 05/09/2011  . Wears glasses     Family History  Problem Relation Age of Onset  . Cancer Mother        colon Cancer  . Hyperlipidemia Mother   . Diabetes Mother   . Heart disease Father   . Hypertension Father   . Cancer Sister        Uterine  . Diabetes Sister   . Diabetes Brother     Past Surgical History:  Procedure Laterality Date  . ABDOMINAL AORTOGRAM W/LOWER EXTREMITY N/A 12/11/2017   Procedure: ABDOMINAL AORTOGRAM W/LOWER EXTREMITY;  Surgeon: Angelia Mould, MD;  Location: [place] [facility];  Service: Cardiovascular;  Laterality: N/A;  . ABDOMINAL AORTOGRAM W/LOWER EXTREMITY Bilateral 04/22/2020   Procedure: ABDOMINAL AORTOGRAM W/LOWER EXTREMITY;  Surgeon: Marty Heck, MD;  Location: [place] [facility];  Service: Cardiovascular;  Laterality: Bilateral;  . 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  . AMPUTATION Left 02/19/2021   Procedure: LEFT ABOVE KNEE AMPUTATION;  Surgeon: Cherre Robins, MD;  Location: [place];  Service: Vascular;  Laterality: Left;  . ANTERIOR CERVICAL DECOMP/DISCECTOMY FUSION  03/08/12   C6-7  . ANTERIOR  CERVICAL DECOMP/DISCECTOMY FUSION  03/08/2012   Procedure: ANTERIOR CERVICAL DECOMPRESSION/DISCECTOMY FUSION 1 LEVEL/HARDWARE REMOVAL;  Surgeon: Erline Levine, MD;  Location: [facility];  Service: Neurosurgery;  Laterality: N/A;  revison of C5-7 anterior cervical decompression with fusion with Cervical Five-Thoracic One anterior cervical decompression with fusion with interbody prothesis plating and bonegraft  . BACK SURGERY  1996  . BIOPSY  12/05/2020   Procedure: BIOPSY;  Surgeon: Ladene Artist, MD;  Location: [facility] ENDOSCOPY;  Service: Endoscopy;;  . BYPASS GRAFT FEMORAL-PERONEAL Left 11/11/2016   Procedure: REDO LEFT FEMORAL-PERONEAL BYPASS WITH PROPATEN 6MM X 80CM GRAFT;  Surgeon: Angelia Mould, MD;  Location: [facility] OR;  Service: Vascular;  Laterality: Left;  . COLONOSCOPY W/ BIOPSIES AND POLYPECTOMY  08/17/2012   f/u 5 years, 4 polyps, no high grade dysplasia or malignancy, tubular adenoma, hyperplastic polyops  . COLONOSCOPY WITH PROPOFOL N/A 12/05/2020   Procedure:  COLONOSCOPY WITH PROPOFOL;  Surgeon: Ladene Artist, MD;  Location: [facility] ENDOSCOPY;  Service: Endoscopy;  Laterality: N/A;  . EMBOLECTOMY Left 12/19/2019   Procedure: LEFT FEMERAL- PERONEAL THROMBECTOMY;  Surgeon: Marty Heck, MD;  Location: [place];  Service: Vascular;  Laterality: Left;  . ENDARTERECTOMY FEMORAL Right 04/24/2020   Procedure: RIGHT FEMORAL ENDARTERECTOMY;  Surgeon: Marty Heck, MD;  Location: [place];  Service: Vascular;  Laterality: Right;  . ENTEROSCOPY N/A 12/11/2015   Procedure: ENTEROSCOPY;  Surgeon: Carol Ada, MD;  Location: [facility] ENDOSCOPY;  Service: Endoscopy;  Laterality: N/A;  . ENTEROSCOPY N/A 03/29/2018   Procedure: ENTEROSCOPY;  Surgeon: Carol Ada, MD;  Location: [facility] ENDOSCOPY;  Service: Endoscopy;  Laterality: N/A;  . ESOPHAGOGASTRODUODENOSCOPY  08/17/2012   normal esophagus and GEJ, diffuse gastritis with erythema- no malignancy, reactive gastropathy  with focal intestinal  metaplasia  . ESOPHAGOGASTRODUODENOSCOPY (EGD) WITH PROPOFOL N/A 12/05/2020   Procedure: ESOPHAGOGASTRODUODENOSCOPY (EGD) WITH PROPOFOL;  Surgeon: Ladene Artist, MD;  Location: [facility] ENDOSCOPY;  Service: Endoscopy;  Laterality: N/A;  . FEMORAL-POPLITEAL BYPASS GRAFT Left 01/06/2016   Procedure: Left  COMMON FEMORAL-BELOW KNEE POPLITEAL ARTERY Bypass using non-reversed translocated saphenous vein graft from left leg;  Surgeon: Mal Misty, MD;  Location: [place];  Service: Vascular;  Laterality: Left;  . GIVENS CAPSULE STUDY N/A 11/24/2015   Procedure: GIVENS CAPSULE STUDY;  Surgeon: Juanita Craver, MD;  Location: [place];  Service: Endoscopy;  Laterality: N/A;  . HOT HEMOSTASIS N/A 03/29/2018   Procedure: HOT HEMOSTASIS (ARGON PLASMA COAGULATION/BICAP);  Surgeon: Carol Ada, MD;  Location: [place];  Service: Endoscopy;  Laterality: N/A;  . INGUINAL HERNIA REPAIR  1990's   right  . INTRAOPERATIVE ARTERIOGRAM Left 01/06/2016   Procedure: INTRA OPERATIVE ARTERIOGRAM LEFT LOWER LEG;  Surgeon: Mal Misty, MD;  Location: [place];  Service: Vascular;  Laterality: Left;  . INTRAOPERATIVE ARTERIOGRAM Left 11/11/2016   Procedure: INTRA OPERATIVE ARTERIOGRAM LEFT LOWER EXTRIMITY;  Surgeon: Angelia Mould, MD;  Location: [place];  Service: Vascular;  Laterality: Left;  . IR ANGIOGRAM FOLLOW UP STUDY  12/11/2017  . IR GENERIC HISTORICAL  10/24/2016   IR ANGIOGRAM FOLLOW UP STUDY  . LOWER EXTREMITY ANGIOGRAM Bilateral 07/30/2015   Procedure: Lower Extremity Angiogram;  Surgeon: Conrad Southport, MD;  Location: [place] [facility];  Service: Cardiovascular;  Laterality: Bilateral;  . [place]   "lower"  . PATCH ANGIOPLASTY Left 12/19/2019   Procedure: LEFT FEMORAL -PERONEAL PATCH ANGIOPLASTY WITH XENOSURE BIOLOGIC PATCH  ;  Surgeon: Marty Heck, MD;  Location: [place];  Service: Vascular;  Laterality: Left;  . PATCH ANGIOPLASTY Right 04/24/2020   Procedure: Patch Angioplasty  of Right Femoral Artery using Long Xenosure Biologic Patch 1cm x 14 cm;  Surgeon: Marty Heck, MD;  Location: [facility] OR;  Service: Vascular;  Laterality: Right;  . PERIPHERAL VASCULAR CATHETERIZATION N/A 07/30/2015   Procedure: Abdominal Aortogram;  Surgeon: Conrad , MD;  Location: [place] [facility];  Service: Cardiovascular;  Laterality: N/A;  . PERIPHERAL VASCULAR CATHETERIZATION N/A 10/24/2016   Procedure: Abdominal Aortogram;  Surgeon: Angelia Mould, MD;  Location: [place] [facility];  Service: Cardiovascular;  Laterality: N/A;  . PERIPHERAL VASCULAR CATHETERIZATION N/A 10/24/2016   Procedure: Lower Extremity Angiography;  Surgeon: Angelia Mould, MD;  Location: [place] [facility];  Service: Cardiovascular;  Laterality: N/A;  . PERIPHERAL VASCULAR INTERVENTION Right 12/11/2017   Procedure: PERIPHERAL VASCULAR INTERVENTION;  Surgeon: Deitra Mayo  S, MD;  Location: [place] [facility];  Service: Cardiovascular;  Laterality: Right;  Marland Kitchen VASCULAR SURGERY  ~ 2007   Stent SFA   . VEIN HARVEST Left 01/06/2016   Procedure: LEFT GREATER SAPPHENOUS VEIN HARVEST;  Surgeon: Mal Misty, MD;  Location: [place];  Service: Vascular;  Laterality: Left;   Social History   Occupational History  . Not on file  Tobacco Use  . Smoking status: Current Some Day Smoker    Years: 24.00    Types: Cigars  . Smokeless tobacco: Never Used  Vaping Use  . Vaping Use: Never used  Substance and Sexual Activity  . Alcohol use: Yes    Alcohol/week: 2.0 standard drinks    Types: 2 Cans of beer per week  . Drug use: No  . Sexual activity: Yes

## 2021-04-15 ENCOUNTER — Telehealth: Payer: Self-pay | Admitting: *Deleted

## 2021-04-15 DIAGNOSIS — D509 Iron deficiency anemia, unspecified: Secondary | ICD-10-CM | POA: Diagnosis not present

## 2021-04-15 DIAGNOSIS — I10 Essential (primary) hypertension: Secondary | ICD-10-CM | POA: Diagnosis not present

## 2021-04-15 DIAGNOSIS — E1140 Type 2 diabetes mellitus with diabetic neuropathy, unspecified: Secondary | ICD-10-CM | POA: Diagnosis not present

## 2021-04-15 DIAGNOSIS — M1990 Unspecified osteoarthritis, unspecified site: Secondary | ICD-10-CM | POA: Diagnosis not present

## 2021-04-15 DIAGNOSIS — Z4781 Encounter for orthopedic aftercare following surgical amputation: Secondary | ICD-10-CM | POA: Diagnosis not present

## 2021-04-15 DIAGNOSIS — Z89612 Acquired absence of left leg above knee: Secondary | ICD-10-CM | POA: Diagnosis not present

## 2021-04-15 DIAGNOSIS — M5412 Radiculopathy, cervical region: Secondary | ICD-10-CM | POA: Diagnosis not present

## 2021-04-15 DIAGNOSIS — E1151 Type 2 diabetes mellitus with diabetic peripheral angiopathy without gangrene: Secondary | ICD-10-CM | POA: Diagnosis not present

## 2021-04-15 DIAGNOSIS — M545 Low back pain, unspecified: Secondary | ICD-10-CM | POA: Diagnosis not present

## 2021-04-15 DIAGNOSIS — G8929 Other chronic pain: Secondary | ICD-10-CM | POA: Diagnosis not present

## 2021-04-15 NOTE — Telephone Encounter (Signed)
[facility] PT and patient call because pt fell yesterday in the restroom and is now having stump pain.   Appt made for Monday afternoon ( 1st available), but advised to call orthopaedic surgeon to see if they could see him sooner.  Prefers not to go to ED. Christen Bame, CMA

## 2021-04-19 ENCOUNTER — Ambulatory Visit
Admission: RE | Admit: 2021-04-19 | Discharge: 2021-04-19 | Disposition: A | Discharge status: Home / Self Care | Payer: Medicare HMO | Source: Ambulatory Visit | Attending: Family Medicine | Admitting: Family Medicine

## 2021-04-19 ENCOUNTER — Other Ambulatory Visit: Payer: Self-pay | Admitting: Family Medicine

## 2021-04-19 ENCOUNTER — Ambulatory Visit (INDEPENDENT_AMBULATORY_CARE_PROVIDER_SITE_OTHER): Payer: Medicare HMO | Admitting: Family Medicine

## 2021-04-19 ENCOUNTER — Other Ambulatory Visit: Payer: Self-pay

## 2021-04-19 ENCOUNTER — Other Ambulatory Visit: Payer: Self-pay | Admitting: Student in an Organized Health Care Education/Training Program

## 2021-04-19 VITALS — BP 120/64 | HR 77 | Ht 67.0 in | Wt 160.8 lb

## 2021-04-19 DIAGNOSIS — M79605 Pain in left leg: Secondary | ICD-10-CM | POA: Diagnosis not present

## 2021-04-19 DIAGNOSIS — Z89512 Acquired absence of left leg below knee: Secondary | ICD-10-CM | POA: Diagnosis not present

## 2021-04-19 DIAGNOSIS — M79652 Pain in left thigh: Secondary | ICD-10-CM | POA: Diagnosis not present

## 2021-04-19 DIAGNOSIS — Z89612 Acquired absence of left leg above knee: Secondary | ICD-10-CM

## 2021-04-19 MED ORDER — OXYCODONE-ACETAMINOPHEN 5-325 MG PO TABS: 1.0000 | ORAL_TABLET | Freq: Four times a day (QID) | ORAL | 0 refills | Status: DC | PRN

## 2021-04-19 NOTE — Patient Instructions (Signed)
Thank you for coming to see me today. It was a pleasure. Today we talked about:   I have placed an order for x-rays of your left leg and hip.  Please go to [facility] to have this completed.  You do not need an appointment.  We will contact you with your results afterwards.  We will give you a one time prescription got 10 tablets of percocet.  Take tylenol scheduled and take this for breakthrough pain.  You will need to see the surgeon for more refills if needed.  Please follow-up with PCP in 1 month.  If you have any questions or concerns, please do not hesitate to call the office at [phone].  Best,   Arizona Constable, DO

## 2021-04-19 NOTE — Assessment & Plan Note (Addendum)
Fall on stump while trying to get into wheelchair.  Now with tenderness palpation of his femur and into his left greater trochanter.  Will obtain x-rays at the patient's request.  Given that he has been on Percocet recently for his AKA and is now without it for about a week and having uncontrolled pain, discussed with Dr. Garlan Fillers, will give a one-time prescription for 10 tablets.  Advised him that he will need to speak with the surgeon if he would like more, as this is who previously prescribed the medication.  PMP reviewed, no red flags.  Patient is agreeable to this.

## 2021-04-19 NOTE — Assessment & Plan Note (Signed)
History of left amputation, fall is likely related to his ability to adjust with this.  X-rays and pain control per above.  Follow-up with PCP.

## 2021-04-19 NOTE — Progress Notes (Signed)
    SUBJECTIVE:   CHIEF COMPLAINT / HPI:   Fall Patient reports that he fell on Easter on his left lower extremity States that the pain radiated to his low back on the left immediately Taking gabapentin 900mg  QHS, tylenol 1000mg  every 6 hrs No longer has percocet Pain has ben worse since falling Still gets phantom pains Most of his pain is in the back and buttocks He is still going to PT  He is urinating normally, no addle anesthesia, no change in bowel habits Has been off percocet about 1-2 weeks States that fall occurred when he was leaving bathroom and tried to get back in his wheelchair, but it wheeled away  PERTINENT  PMH / PSH: PAD, hypertension, T2DM, history of limb ischemia status post AKA on the left, constipation, GERD, OSA, tobacco abuse, iron deficiency anemia, depression  OBJECTIVE:   BP 120/64   Pulse 77   Ht 5\' 7"  (1.702 m)   Wt 160 lb 12.8 oz (72.9 kg)   SpO2 98%   BMI 25.18 kg/m    Physical Exam:  General: 70 y.o. male in NAD Lungs: No increased work of breathing on room air Abdomen: Soft, non-tender to palpation, non-distended, positive bowel sounds Skin: warm and dry Extremities: left AKA with well-healing scar, no obvious redness or swelling, TTP distal femur, TTP left greater trochanter, advised that she is aware she is TTP left piriformis region, full ROM of left stump   ASSESSMENT/PLAN:   Acute pain of left lower extremity Fall on stump while trying to get into wheelchair.  Now with tenderness palpation of his femur and into his left greater trochanter.  Will obtain x-rays at the patient's request.  Given that he has been on Percocet recently for his AKA and is now without it for about a week and having uncontrolled pain, discussed with Dr. Garlan Fillers, will give a one-time prescription for 10 tablets.  Advised him that he will need to speak with the surgeon if he would like more, as this is who previously prescribed the medication.  PMP reviewed, no red  flags.  Patient is agreeable to this.  S/P above knee amputation, left ([place]) History of left amputation, fall is likely related to his ability to adjust with this.  X-rays and pain control per above.  Follow-up with PCP.     Cleophas Dunker, [place]

## 2021-04-22 ENCOUNTER — Telehealth: Payer: Self-pay

## 2021-04-22 DIAGNOSIS — M1990 Unspecified osteoarthritis, unspecified site: Secondary | ICD-10-CM | POA: Diagnosis not present

## 2021-04-22 DIAGNOSIS — Z4781 Encounter for orthopedic aftercare following surgical amputation: Secondary | ICD-10-CM | POA: Diagnosis not present

## 2021-04-22 DIAGNOSIS — G8929 Other chronic pain: Secondary | ICD-10-CM | POA: Diagnosis not present

## 2021-04-22 DIAGNOSIS — I10 Essential (primary) hypertension: Secondary | ICD-10-CM | POA: Diagnosis not present

## 2021-04-22 DIAGNOSIS — E1151 Type 2 diabetes mellitus with diabetic peripheral angiopathy without gangrene: Secondary | ICD-10-CM | POA: Diagnosis not present

## 2021-04-22 DIAGNOSIS — D509 Iron deficiency anemia, unspecified: Secondary | ICD-10-CM | POA: Diagnosis not present

## 2021-04-22 DIAGNOSIS — Z89612 Acquired absence of left leg above knee: Secondary | ICD-10-CM | POA: Diagnosis not present

## 2021-04-22 DIAGNOSIS — E1140 Type 2 diabetes mellitus with diabetic neuropathy, unspecified: Secondary | ICD-10-CM | POA: Diagnosis not present

## 2021-04-22 DIAGNOSIS — M545 Low back pain, unspecified: Secondary | ICD-10-CM | POA: Diagnosis not present

## 2021-04-22 DIAGNOSIS — M5412 Radiculopathy, cervical region: Secondary | ICD-10-CM | POA: Diagnosis not present

## 2021-04-22 NOTE — Telephone Encounter (Signed)
[name] [facility] PT calls nurse line requesting a verbal order to reassess patient for PT needs.   Verbal order given per [facility] protocol.

## 2021-04-26 DIAGNOSIS — E1140 Type 2 diabetes mellitus with diabetic neuropathy, unspecified: Secondary | ICD-10-CM | POA: Diagnosis not present

## 2021-04-26 DIAGNOSIS — I10 Essential (primary) hypertension: Secondary | ICD-10-CM | POA: Diagnosis not present

## 2021-04-26 DIAGNOSIS — M5412 Radiculopathy, cervical region: Secondary | ICD-10-CM | POA: Diagnosis not present

## 2021-04-26 DIAGNOSIS — G8929 Other chronic pain: Secondary | ICD-10-CM | POA: Diagnosis not present

## 2021-04-26 DIAGNOSIS — E1151 Type 2 diabetes mellitus with diabetic peripheral angiopathy without gangrene: Secondary | ICD-10-CM | POA: Diagnosis not present

## 2021-04-26 DIAGNOSIS — D509 Iron deficiency anemia, unspecified: Secondary | ICD-10-CM | POA: Diagnosis not present

## 2021-04-26 DIAGNOSIS — Z4781 Encounter for orthopedic aftercare following surgical amputation: Secondary | ICD-10-CM | POA: Diagnosis not present

## 2021-04-26 DIAGNOSIS — Z89612 Acquired absence of left leg above knee: Secondary | ICD-10-CM | POA: Diagnosis not present

## 2021-04-26 DIAGNOSIS — M1990 Unspecified osteoarthritis, unspecified site: Secondary | ICD-10-CM | POA: Diagnosis not present

## 2021-04-26 DIAGNOSIS — M545 Low back pain, unspecified: Secondary | ICD-10-CM | POA: Diagnosis not present

## 2021-04-28 NOTE — Progress Notes (Signed)
HISTORY AND PHYSICAL     CC:  follow up. Requesting Provider:  Richarda Osmond, DO  HPI: This is a 70 y.o. male who is here today for follow up for PAD.  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 Stanford Breed.  He has hx of right femoral endarterectomy for ischemic rest pain on 04/24/2020 by Dr. Carlis Abbott and hx of right EIA stent with 7 mm x 29 mm VBX in 2018 by Dr. Scot Dock.  Pt was last seen 04/01/2021 and at that time, his left AKA was healing without pain issues.  He was scheduled to come back for surveillance of the right leg.  The pt returns today for follow up.  He states that back on Easter he fell on his left AKA stump.  He had xrays and they were negative.  He states that on his right leg, he does not have any rest pain or non healing wounds.  He does have claudication when walking with his walker at about 53ft and this improves with rest and starts again at about the same distance.    Pt denies any temporary blindness, speech difficulties, sudden numbness, paralysis or weakness of any extremity or facial droop.  The pt is on a statin for cholesterol management.    The pt is not on an aspirin.    Other AC:  Plavix The pt is on ACEI, BB for hypertension.  The pt does have diabetes. Tobacco hx:  current    Past Medical History:  Diagnosis Date  . Adhesive capsulitis 03/10/2020  . Anemia   . Angiodysplasia of small intestine ([place]) 03/29/2018   Enteroscopy was significant for angiodysplasia 03/29/2018  . Arthritis    OA  . Cervical radiculopathy    Dr. Vertell Limber neurosurgery  . Chronic lower back pain   . Claudication of both lower extremities ([place]) 07/15/2015  . Critical lower limb ischemia ([place]) 12/19/2019  . Diabetes mellitus    takes Metformin daily  . GERD (gastroesophageal reflux disease)    takes Protonix daily  . History of blood transfusion    "related to low HgB" ((09/10/2015  . Hyperlipidemia    takes Vytorin daily  . Hypertension    takes Benazepril and Bystolic daily  .  PAD (peripheral artery disease) ([place])   . Pneumonia   . Radiculopathy, cervical region 02/11/2017  . Shortness of breath dyspnea    with exertion  . Tobacco user   . Toe fracture, right 05/09/2011  . Wears glasses     Past Surgical History:  Procedure Laterality Date  . ABDOMINAL AORTOGRAM W/LOWER EXTREMITY N/A 12/11/2017   Procedure: ABDOMINAL AORTOGRAM W/LOWER EXTREMITY;  Surgeon: Angelia Mould, MD;  Location: [place] [facility];  Service: Cardiovascular;  Laterality: N/A;  . ABDOMINAL AORTOGRAM W/LOWER EXTREMITY Bilateral 04/22/2020   Procedure: ABDOMINAL AORTOGRAM W/LOWER EXTREMITY;  Surgeon: Marty Heck, MD;  Location: [place] [facility];  Service: Cardiovascular;  Laterality: Bilateral;  . 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  . AMPUTATION Left 02/19/2021   Procedure: LEFT ABOVE KNEE AMPUTATION;  Surgeon: Cherre Robins, MD;  Location: [place];  Service: Vascular;  Laterality: Left;  . ANTERIOR CERVICAL DECOMP/DISCECTOMY FUSION  03/08/12   C6-7  . ANTERIOR CERVICAL DECOMP/DISCECTOMY FUSION  03/08/2012   Procedure: ANTERIOR CERVICAL DECOMPRESSION/DISCECTOMY FUSION 1 LEVEL/HARDWARE REMOVAL;  Surgeon: Erline Levine, MD;  Location: [place] [facility]UR[facility] ORS;  Service: Neurosurgery;  Laterality: N/A;  revison of C5-7 anterior cervical decompression with fusion with Cervical Five-Thoracic  One anterior cervical decompression with fusion with interbody prothesis plating and bonegraft  . BACK SURGERY  1996  . BIOPSY  12/05/2020   Procedure: BIOPSY;  Surgeon: Ladene Artist, MD;  Location: [facility] ENDOSCOPY;  Service: Endoscopy;;  . BYPASS GRAFT FEMORAL-PERONEAL Left 11/11/2016   Procedure: REDO LEFT FEMORAL-PERONEAL BYPASS WITH PROPATEN 6MM X 80CM GRAFT;  Surgeon: Angelia Mould, MD;  Location: [facility] OR;  Service: Vascular;  Laterality: Left;  . COLONOSCOPY W/ BIOPSIES AND POLYPECTOMY  08/17/2012   f/u 5 years, 4 polyps, no high grade dysplasia or malignancy, tubular adenoma,  hyperplastic polyops  . COLONOSCOPY WITH PROPOFOL N/A 12/05/2020   Procedure: COLONOSCOPY WITH PROPOFOL;  Surgeon: Ladene Artist, MD;  Location[facility];  Service: Endoscopy;  Laterality: N/A;  . EMBOLECTOMY Left 12/19/2019   Procedure: LEFT FEMERAL- PERONEAL THROMBECTOMY;  Surgeon: Marty Heck, MD;  Location: [place];  Service: Vascular;  Laterality: Left;  . ENDARTERECTOMY FEMORAL Right 04/24/2020   Procedure: RIGHT FEMORAL ENDARTERECTOMY;  Surgeon: Marty Heck, MD;  Location: [place];  Service: Vascular;  Laterality: Right;  . ENTEROSCOPY N/A 12/11/2015   Procedure: ENTEROSCOPY;  Surgeon: Carol Ada, MD;  Location: [facility] ENDOSCOPY;  Service: Endoscopy;  Laterality: N/A;  . ENTEROSCOPY N/A 03/29/2018   Procedure: ENTEROSCOPY;  Surgeon: Carol Ada, MD;  Location: [facility] ENDOSCOPY;  Service: Endoscopy;  Laterality: N/A;  . ESOPHAGOGASTRODUODENOSCOPY  08/17/2012   normal esophagus and GEJ, diffuse gastritis with erythema- no malignancy, reactive gastropathy  with focal intestinal metaplasia  . ESOPHAGOGASTRODUODENOSCOPY (EGD) WITH PROPOFOL N/A 12/05/2020   Procedure: ESOPHAGOGASTRODUODENOSCOPY (EGD) WITH PROPOFOL;  Surgeon: Ladene Artist, MD;  Location: [facility] ENDOSCOPY;  Service: Endoscopy;  Laterality: N/A;  . FEMORAL-POPLITEAL BYPASS GRAFT Left 01/06/2016   Procedure: Left  COMMON FEMORAL-BELOW KNEE POPLITEAL ARTERY Bypass using non-reversed translocated saphenous vein graft from left leg;  Surgeon: Mal Misty, MD;  Location: [place];  Service: Vascular;  Laterality: Left;  . GIVENS CAPSULE STUDY N/A 11/24/2015   Procedure: GIVENS CAPSULE STUDY;  Surgeon: Juanita Craver, MD;  Location: [place];  Service: Endoscopy;  Laterality: N/A;  . HOT HEMOSTASIS N/A 03/29/2018   Procedure: HOT HEMOSTASIS (ARGON PLASMA COAGULATION/BICAP);  Surgeon: Carol Ada, MD;  Location: [place];  Service: Endoscopy;  Laterality: N/A;  . INGUINAL HERNIA REPAIR  1990's   right  . INTRAOPERATIVE  ARTERIOGRAM Left 01/06/2016   Procedure: INTRA OPERATIVE ARTERIOGRAM LEFT LOWER LEG;  Surgeon: Mal Misty, MD;  Location: [place];  Service: Vascular;  Laterality: Left;  . INTRAOPERATIVE ARTERIOGRAM Left 11/11/2016   Procedure: INTRA OPERATIVE ARTERIOGRAM LEFT LOWER EXTRIMITY;  Surgeon: Angelia Mould, MD;  Location: [place];  Service: Vascular;  Laterality: Left;  . IR ANGIOGRAM FOLLOW UP STUDY  12/11/2017  . IR GENERIC HISTORICAL  10/24/2016   IR ANGIOGRAM FOLLOW UP STUDY  . LOWER EXTREMITY ANGIOGRAM Bilateral 07/30/2015   Procedure: Lower Extremity Angiogram;  Surgeon: Conrad Nobles, MD;  Location: [place] CV LAB;  Service: Cardiovascular;  Laterality: Bilateral;  . [place]   "lower"  . PATCH ANGIOPLASTY Left 12/19/2019   Procedure: LEFT FEMORAL -PERONEAL PATCH ANGIOPLASTY WITH XENOSURE BIOLOGIC PATCH  ;  Surgeon: Marty Heck, MD;  Location: [place];  Service: Vascular;  Laterality: Left;  . PATCH ANGIOPLASTY Right 04/24/2020   Procedure: Patch Angioplasty of Right Femoral Artery using Long Xenosure Biologic Patch 1cm x 14 cm;  Surgeon: Marty Heck, MD;  Location: [place];  Service:  Vascular;  Laterality: Right;  . PERIPHERAL VASCULAR CATHETERIZATION N/A 07/30/2015   Procedure: Abdominal Aortogram;  Surgeon: Fransisco HertzBrian L Chen, MD;  [facility];  Service: Cardiovascular;  Laterality: N/A;  . PERIPHERAL VASCULAR CATHETERIZATION N/A 10/24/2016   Procedure: Abdominal Aortogram;  Surgeon: Chuck Hinthristopher S Dickson, MD;  [facility];  Service: Cardiovascular;  Laterality: N/A;  . PERIPHERAL VASCULAR CATHETERIZATION N/A 10/24/2016   Procedure: Lower Extremity Angiography;  Surgeon: Chuck Hinthristopher S Dickson, MD;  [facility];  Service: Cardiovascular;  Laterality: N/A;  . PERIPHERAL VASCULAR INTERVENTION Right 12/11/2017   Procedure: PERIPHERAL VASCULAR INTERVENTION;  Surgeon: Chuck Hintickson, Christopher S, MD;  [facility];  Service: Cardiovascular;  Laterality: Right;  Marland Kitchen. VASCULAR SURGERY  ~ 2007   Stent SFA   . VEIN HARVEST Left 01/06/2016   Procedure: LEFT GREATER SAPPHENOUS VEIN HARVEST;  Surgeon: Pryor OchoaJames D Lawson, MD;  [facility];  Service: Vascular;  Laterality: Left;    Allergies  Allergen Reactions  . Glipizide Other (See Comments)    REACTION IS SIDE EFFECT Severe hypoglycemia to 40s.     Current Outpatient Medications  Medication Sig Dispense Refill  . atorvastatin (LIPITOR) 40 MG tablet TAKE 1 TABLET BY MOUTH ONCE DAILY (Patient taking differently: Take 40 mg by mouth at bedtime.) 90 tablet 2  . benazepril (LOTENSIN) 5 MG tablet TAKE 1 TABLET BY MOUTH DAILY (Patient taking differently: Take 5 mg by mouth daily.) 90 tablet 0  . clopidogrel (PLAVIX) 75 MG tablet Take 75 mg by mouth daily.    . diclofenac Sodium (VOLTAREN) 1 % GEL Apply 2 g topically 4 (four) times daily. 50 g 1  . ezetimibe (ZETIA) 10 MG tablet TAKE 1 TABLET BY MOUTH ONCE DAILY (Patient taking differently: Take 10 mg by mouth daily.) 90 tablet 1  . ferrous sulfate 325 (65 FE) MG EC tablet Take 1 tablet (325 mg total) by mouth daily with breakfast. 90 tablet 1  . FLUoxetine (PROZAC) 10 MG capsule Take 10 mg by mouth daily.    Marland Kitchen. gabapentin (NEURONTIN) 300 MG capsule Take 1 capsule (300 mg total) by mouth 2 (two) times daily. 60 capsule 1  . metFORMIN (GLUCOPHAGE) 1000 MG tablet TAKE ONE (1) TABLET BY MOUTH TWICE DAILY WITH MEALS 90 tablet 1  . Nebivolol HCl 20 MG TABS TAKE 1 TABLET BY MOUTH DAILY (Patient taking differently: Take 20 mg by mouth daily.) 90 tablet 0  . oxyCODONE-acetaminophen (PERCOCET/ROXICET) 5-325 MG tablet Take 1 tablet by mouth every 6 (six) hours as needed for moderate pain. 10 tablet 0  . pantoprazole (PROTONIX) 40 MG tablet Take 1 tablet (40 mg total) by mouth daily as needed. (Patient taking differently: Take 40 mg by mouth daily.) 90 tablet 0  . polyethylene glycol (MIRALAX / GLYCOLAX) 17 g  packet Take 17 g by mouth daily. 14 each 0  . senna (SENOKOT) 8.6 MG TABS tablet Take 1 tablet (8.6 mg total) by mouth daily. 30 tablet 0   No current facility-administered medications for this visit.    Family History  Problem Relation Age of Onset  . Cancer Mother        colon Cancer  . Hyperlipidemia Mother   . Diabetes Mother   . Heart disease Father   . Hypertension Father   . Cancer Sister        Uterine  . Diabetes Sister   . Diabetes Brother     Social History  Socioeconomic History  . Marital status: Significant Other    Spouse name: Not on file  . Number of children: 3  . Years of education: 67  . Highest education level: Some college, no degree  Occupational History  . Not on file  Tobacco Use  . Smoking status: Current Some Day Smoker    Years: 24.00    Types: Cigars  . Smokeless tobacco: Never Used  Vaping Use  . Vaping Use: Never used  Substance and Sexual Activity  . Alcohol use: Yes    Alcohol/week: 2.0 standard drinks    Types: 2 Cans of beer per week  . Drug use: No  . Sexual activity: Yes  Other Topics Concern  . Not on file  Social History Narrative   Lives alone. Lives in an apartment. Ground floor apartment. Smoke alarms present, no grab bars.    Has a shih-tzu, named Missy.    Patient eats a variety of foods. Drinks tea, Kool-aid, coffee      Use public transportation or brother will take places.   Some food insecurity.      Social Determinants of Health   Financial Resource Strain: Not on file  Food Insecurity: Not on file  Transportation Needs: Not on file  Physical Activity: Not on file  Stress: Not on file  Social Connections: Not on file  Intimate Partner Violence: Not on file     REVIEW OF SYSTEMS:   [X]  denotes positive finding, [ ]  denotes negative finding Cardiac  Comments:  Chest pain or chest pressure:    Shortness of breath upon exertion:    Short of breath when lying flat:    Irregular heart rhythm:         Vascular    Pain in calf, thigh, or hip brought on by ambulation: x   Pain in feet at night that wakes you up from your sleep:     Blood clot in your veins:    Leg swelling:         Pulmonary    Oxygen at home:    Productive cough:     Wheezing:         Neurologic    Sudden weakness in arms or legs:     Sudden numbness in arms or legs:     Sudden onset of difficulty speaking or slurred speech:    Temporary loss of vision in one eye:     Problems with dizziness:         Gastrointestinal    Blood in stool:     Vomited blood:         Genitourinary    Burning when urinating:     Blood in urine:        Psychiatric    Major depression:         Hematologic    Bleeding problems:    Problems with blood clotting too easily:        Skin    Rashes or ulcers:        Constitutional    Fever or chills:      PHYSICAL EXAMINATION:  Today's Vitals   04/29/21 1012  BP: (!) 154/64  Pulse: (!) 54  Resp: 20  Temp: 98.7 F (37.1 C)  TempSrc: Temporal  SpO2: 96%  Height: 5\' 7"  (1.702 m)  PainSc: 4    Body mass index is 25.18 kg/m.   General:  WDWN in NAD; vital signs documented above Gait: Not observed HENT:  WNL, normocephalic Pulmonary: normal non-labored breathing , without wheezing Cardiac: regular HR, without  Murmur; with carotid bruit on the right Abdomen: soft, NT, no masses; aortic pulse is not palpable Skin: without rashes Vascular Exam/Pulses:  Right Left  Radial 2+ (normal) 2+ (normal)  Femoral Unable to palpate due to pt being in wheelchair Unable to palpate due to pt being in wheelchair  DP absent AKA  PT Brisk monophasic AKA  Peroneal Brisk monophasic AKA   Extremities: without ischemic changes, without Gangrene , without cellulitis; without open wounds;  Musculoskeletal: no muscle wasting or atrophy  Neurologic: A&O X 3;  No focal weakness or paresthesias are detected Psychiatric:  The pt has Normal affect.   Non-Invasive Vascular Imaging:    ABI's/TBI's on 04/29/2021: Right:  0.44/0 - Great toe pressure: 0 Left:  AKA  Previous ABI's/TBI's on 02/12/2021: Right:  0.64/0.54 - Great toe pressure: 70 Left:  0.16/0 - Great toe pressure:  absent   ASSESSMENT/PLAN:: 70 y.o. male here for follow up for PAD with hx of right femoral endarterectomy for ischemic rest pain on 04/24/2020 by Dr. Carlis Abbott and hx of right EIA stent with 7 mm x 29 mm VBX in 2018 by Dr. Scot Dock.  PAD -pt had decrease in ABI today from previous visit-he does not rest pain or wounds on his left foot.  He does have claudication with walking with his walker at about 19ft.   Discussed with pt about protecting his foot and continue moisturizing his leg to keep from dry skin cracking.  He will continue his statin/plavix.  -will have him f/u in 3 months with ABI on Dr. Ainsley Spinner clinic day.  Pt will call sooner if he develops any rest pain or non healing wounds.  Right Carotid bruit -pt had a carotid duplex in 2020, which revealed 1-39% bilateral ICA stenosis.  He denies any stroke sx.  He does have a right carotid bruit.  Will bring him back in 2-3 weeks for carotid u/s and see PA.    Leontine Locket, [facility] [phone]  Clinic MD:   Oneida Alar

## 2021-04-29 ENCOUNTER — Other Ambulatory Visit: Payer: Self-pay

## 2021-04-29 ENCOUNTER — Ambulatory Visit (HOSPITAL_COMMUNITY)
Admission: RE | Admit: 2021-04-29 | Discharge: 2021-04-29 | Disposition: A | Discharge status: Home / Self Care | Payer: Medicare HMO | Source: Ambulatory Visit | Attending: Vascular Surgery | Admitting: Vascular Surgery

## 2021-04-29 ENCOUNTER — Ambulatory Visit (INDEPENDENT_AMBULATORY_CARE_PROVIDER_SITE_OTHER): Payer: Medicare HMO | Admitting: Physician Assistant

## 2021-04-29 VITALS — BP 154/64 | HR 54 | Temp 98.7°F | Resp 20 | Ht 67.0 in

## 2021-04-29 DIAGNOSIS — I739 Peripheral vascular disease, unspecified: Secondary | ICD-10-CM | POA: Diagnosis not present

## 2021-04-29 DIAGNOSIS — R0989 Other specified symptoms and signs involving the circulatory and respiratory systems: Secondary | ICD-10-CM

## 2021-04-30 ENCOUNTER — Other Ambulatory Visit: Payer: Self-pay

## 2021-04-30 DIAGNOSIS — I70229 Atherosclerosis of native arteries of extremities with rest pain, unspecified extremity: Secondary | ICD-10-CM

## 2021-04-30 DIAGNOSIS — R0989 Other specified symptoms and signs involving the circulatory and respiratory systems: Secondary | ICD-10-CM

## 2021-05-04 ENCOUNTER — Ambulatory Visit (INDEPENDENT_AMBULATORY_CARE_PROVIDER_SITE_OTHER): Payer: Medicare HMO

## 2021-05-04 ENCOUNTER — Other Ambulatory Visit: Payer: Self-pay

## 2021-05-04 DIAGNOSIS — Z4781 Encounter for orthopedic aftercare following surgical amputation: Secondary | ICD-10-CM | POA: Diagnosis not present

## 2021-05-04 DIAGNOSIS — Z89612 Acquired absence of left leg above knee: Secondary | ICD-10-CM | POA: Diagnosis not present

## 2021-05-04 DIAGNOSIS — M5412 Radiculopathy, cervical region: Secondary | ICD-10-CM | POA: Diagnosis not present

## 2021-05-04 DIAGNOSIS — E1151 Type 2 diabetes mellitus with diabetic peripheral angiopathy without gangrene: Secondary | ICD-10-CM | POA: Diagnosis not present

## 2021-05-04 DIAGNOSIS — M1990 Unspecified osteoarthritis, unspecified site: Secondary | ICD-10-CM | POA: Diagnosis not present

## 2021-05-04 DIAGNOSIS — I10 Essential (primary) hypertension: Secondary | ICD-10-CM | POA: Diagnosis not present

## 2021-05-04 DIAGNOSIS — Z23 Encounter for immunization: Secondary | ICD-10-CM

## 2021-05-04 DIAGNOSIS — G8929 Other chronic pain: Secondary | ICD-10-CM | POA: Diagnosis not present

## 2021-05-04 DIAGNOSIS — R911 Solitary pulmonary nodule: Secondary | ICD-10-CM | POA: Diagnosis not present

## 2021-05-04 DIAGNOSIS — D509 Iron deficiency anemia, unspecified: Secondary | ICD-10-CM | POA: Diagnosis not present

## 2021-05-04 DIAGNOSIS — E1140 Type 2 diabetes mellitus with diabetic neuropathy, unspecified: Secondary | ICD-10-CM | POA: Diagnosis not present

## 2021-05-04 NOTE — Telephone Encounter (Signed)
Patient presents to nurse clinic for second [place] booster. Patient is also requesting refill on Plavix and would like to know if he is supposed to restart fluoxetine. Medication was discontinued after hospital stay in Feb 2022.   Please advise.   Talbot Grumbling, RN

## 2021-05-05 MED ORDER — CLOPIDOGREL BISULFATE 75 MG PO TABS: 75.0000 mg | ORAL_TABLET | Freq: Every day | ORAL | 0 refills | Status: DC

## 2021-05-11 DIAGNOSIS — Z4781 Encounter for orthopedic aftercare following surgical amputation: Secondary | ICD-10-CM | POA: Diagnosis not present

## 2021-05-11 DIAGNOSIS — G8929 Other chronic pain: Secondary | ICD-10-CM | POA: Diagnosis not present

## 2021-05-11 DIAGNOSIS — E1151 Type 2 diabetes mellitus with diabetic peripheral angiopathy without gangrene: Secondary | ICD-10-CM | POA: Diagnosis not present

## 2021-05-11 DIAGNOSIS — R911 Solitary pulmonary nodule: Secondary | ICD-10-CM | POA: Diagnosis not present

## 2021-05-11 DIAGNOSIS — I10 Essential (primary) hypertension: Secondary | ICD-10-CM | POA: Diagnosis not present

## 2021-05-11 DIAGNOSIS — M5412 Radiculopathy, cervical region: Secondary | ICD-10-CM | POA: Diagnosis not present

## 2021-05-11 DIAGNOSIS — Z89612 Acquired absence of left leg above knee: Secondary | ICD-10-CM | POA: Diagnosis not present

## 2021-05-11 DIAGNOSIS — D509 Iron deficiency anemia, unspecified: Secondary | ICD-10-CM | POA: Diagnosis not present

## 2021-05-11 DIAGNOSIS — M1990 Unspecified osteoarthritis, unspecified site: Secondary | ICD-10-CM | POA: Diagnosis not present

## 2021-05-11 DIAGNOSIS — E1140 Type 2 diabetes mellitus with diabetic neuropathy, unspecified: Secondary | ICD-10-CM | POA: Diagnosis not present

## 2021-05-12 NOTE — Progress Notes (Signed)
HISTORY AND PHYSICAL     CC:  follow up. Requesting Provider:  Richarda Osmond, DO  HPI: This is a 70 y.o. male here for follow up for carotid artery stenosis.    Pt was last seen 04/29/2021 and at that time he was there for follow up PAD.  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 Stanford Breed.  He has hx of right femoral endarterectomy for ischemic rest pain on 04/24/2020 by Dr. Carlis Abbott and hx of right EIA stent with 7 mm x 29 mm VBX in 2018 by Dr. Scot Dock.  He had a decreased ABI from previous visit without any rest pain or wounds on the left foot.  He does have claudication with walking with the walker at about 59ft.  It was discussed with pt to protect his foot and continue moisturizing his leg to keep dry skin from cracking.  He is scheduled to f/u in 3 months with ABI's.  A right carotid bruit was heard.  Pt had a carotid duplex in 2020 that revealed 1-39% bilateral ICA stenosis.  He did not have any stroke sx and he was scheduled for a follow up carotid duplex in a couple of weeks.  Pt returns today for follow up for his carotid duplex.   Pt denies any amaurosis fugax, speech difficulties, weakness, numbness, paralysis or clumsiness or facial droop.  States he does have some neck pain and that he may have slept wrong.  He denies any radiating pain.  The pt is on a statin for cholesterol management.  The pt is not on a daily aspirin.   Other AC:  Plavix The pt is on ACEI, BB for hypertension.   The pt is diabetic.   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   Past Medical History:  Diagnosis Date  . Adhesive capsulitis 03/10/2020  . Anemia   . Angiodysplasia of small intestine ([place]) 03/29/2018   Enteroscopy was significant for angiodysplasia 03/29/2018  . Arthritis    OA  . Cervical radiculopathy    Dr. Vertell Limber neurosurgery  . Chronic lower back pain   . Claudication of both lower extremities ([place]) 07/15/2015  . Critical lower limb ischemia ([place]) 12/19/2019   . Diabetes mellitus    takes Metformin daily  . GERD (gastroesophageal reflux disease)    takes Protonix daily  . History of blood transfusion    "related to low HgB" ((09/10/2015  . Hyperlipidemia    takes Vytorin daily  . Hypertension    takes Benazepril and Bystolic daily  . PAD (peripheral artery disease) ([place])   . Pneumonia   . Radiculopathy, cervical region 02/11/2017  . Shortness of breath dyspnea    with exertion  . Tobacco user   . Toe fracture, right 05/09/2011  . Wears glasses     Past Surgical History:  Procedure Laterality Date  . ABDOMINAL AORTOGRAM W/LOWER EXTREMITY N/A 12/11/2017   Procedure: ABDOMINAL AORTOGRAM W/LOWER EXTREMITY;  Surgeon: Angelia Mould, MD;  Location: [place] [facility];  Service: Cardiovascular;  Laterality: N/A;  . ABDOMINAL AORTOGRAM W/LOWER EXTREMITY Bilateral 04/22/2020   Procedure: ABDOMINAL AORTOGRAM W/LOWER EXTREMITY;  Surgeon: Marty Heck, MD;  Location: [place] [facility];  Service: Cardiovascular;  Laterality: Bilateral;  . 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  . AMPUTATION Left 02/19/2021   Procedure: LEFT ABOVE KNEE AMPUTATION;  Surgeon: Cherre Robins, MD;  Location: [place];  Service: Vascular;  Laterality: Left;  . ANTERIOR CERVICAL DECOMP/DISCECTOMY FUSION  03/08/12   C6-7  . ANTERIOR CERVICAL DECOMP/DISCECTOMY FUSION  03/08/2012   Procedure: ANTERIOR CERVICAL DECOMPRESSION/DISCECTOMY FUSION 1 LEVEL/HARDWARE REMOVAL;  Surgeon: Erline Levine, MD;  Location: [place] [facility];  Service: Neurosurgery;  Laterality: N/A;  revison of C5-7 anterior cervical decompression with fusion with Cervical Five-Thoracic One anterior cervical decompression with fusion with interbody prothesis plating and bonegraft  . BACK SURGERY  1996  . BIOPSY  12/05/2020   Procedure: BIOPSY;  Surgeon: Ladene Artist, MD;  Location: [facility] END[facility];  Service: Endoscopy;;  . BYPASS GRAFT FEMORAL-PERONEAL Left 11/11/2016   Procedure: REDO LEFT  FEMORAL-PERONEAL BYPASS WITH PROPATEN 6MM X 80CM GRAFT;  Surgeon: Angelia Mould, MD;  Location: [facility] OR;  Service: Vascular;  Laterality: Left;  . COLONOSCOPY W/ BIOPSIES AND POLYPECTOMY  08/17/2012   f/u 5 years, 4 polyps, no high grade dysplasia or malignancy, tubular adenoma, hyperplastic polyops  . COLONOSCOPY WITH PROPOFOL N/A 12/05/2020   Procedure: COLONOSCOPY WITH PROPOFOL;  Surgeon: Ladene Artist, MD;  Location: [facility] ENDOSCOPY;  Service: Endoscopy;  Laterality: N/A;  . EMBOLECTOMY Left 12/19/2019   Procedure: LEFT FEMERAL- PERONEAL THROMBECTOMY;  Surgeon: Marty Heck, MD;  Location: [place];  Service: Vascular;  Laterality: Left;  . ENDARTERECTOMY FEMORAL Right 04/24/2020   Procedure: RIGHT FEMORAL ENDARTERECTOMY;  Surgeon: Marty Heck, MD;  Location: [place];  Service: Vascular;  Laterality: Right;  . ENTEROSCOPY N/A 12/11/2015   Procedure: ENTEROSCOPY;  Surgeon: Carol Ada, MD;  Location: [facility] ENDOSCOPY;  Service: Endoscopy;  Laterality: N/A;  . ENTEROSCOPY N/A 03/29/2018   Procedure: ENTEROSCOPY;  Surgeon: Carol Ada, MD;  Location: [facility] ENDOSCOPY;  Service: Endoscopy;  Laterality: N/A;  . ESOPHAGOGASTRODUODENOSCOPY  08/17/2012   normal esophagus and GEJ, diffuse gastritis with erythema- no malignancy, reactive gastropathy  with focal intestinal metaplasia  . ESOPHAGOGASTRODUODENOSCOPY (EGD) WITH PROPOFOL N/A 12/05/2020   Procedure: ESOPHAGOGASTRODUODENOSCOPY (EGD) WITH PROPOFOL;  Surgeon: Ladene Artist, MD;  Location: [facility] ENDOSCOPY;  Service: Endoscopy;  Laterality: N/A;  . FEMORAL-POPLITEAL BYPASS GRAFT Left 01/06/2016   Procedure: Left  COMMON FEMORAL-BELOW KNEE POPLITEAL ARTERY Bypass using non-reversed translocated saphenous vein graft from left leg;  Surgeon: Mal Misty, MD;  Location: [place];  Service: Vascular;  Laterality: Left;  . GIVENS CAPSULE STUDY N/A 11/24/2015   Procedure: GIVENS CAPSULE STUDY;  Surgeon: Juanita Craver, MD;  Location: [place];   Service: Endoscopy;  Laterality: N/A;  . HOT HEMOSTASIS N/A 03/29/2018   Procedure: HOT HEMOSTASIS (ARGON PLASMA COAGULATION/BICAP);  Surgeon: Carol Ada, MD;  Location: [place];  Service: Endoscopy;  Laterality: N/A;  . INGUINAL HERNIA REPAIR  1990's   right  . INTRAOPERATIVE ARTERIOGRAM Left 01/06/2016   Procedure: INTRA OPERATIVE ARTERIOGRAM LEFT LOWER LEG;  Surgeon: Mal Misty, MD;  Location: [place];  Service: Vascular;  Laterality: Left;  . INTRAOPERATIVE ARTERIOGRAM Left 11/11/2016   Procedure: INTRA OPERATIVE ARTERIOGRAM LEFT LOWER EXTRIMITY;  Surgeon: Angelia Mould, MD;  Location: [place];  Service: Vascular;  Laterality: Left;  . IR ANGIOGRAM FOLLOW UP STUDY  12/11/2017  . IR GENERIC HISTORICAL  10/24/2016   IR ANGIOGRAM FOLLOW UP STUDY  . LOWER EXTREMITY ANGIOGRAM Bilateral 07/30/2015   Procedure: Lower Extremity Angiogram;  Surgeon: Conrad Bellevue, MD;  Location: [place] [facility];  Service: Cardiovascular;  Laterality: Bilateral;  . [place]   "lower"  . PATCH ANGIOPLASTY Left 12/19/2019   Procedure: LEFT FEMORAL -PERONEAL PATCH  [place]  ;  Surgeon: Marty Heck, MD;  Location: [place];  Service: Vascular;  Laterality: Left;  . PATCH ANGIOPLASTY Right 04/24/2020   Procedure: Patch Angioplasty of Right Femoral Artery using Long Xenosure Biologic Patch 1cm x 14 cm;  Surgeon: Marty Heck, MD;  Location: [facility] OR;  Service: Vascular;  Laterality: Right;  . PERIPHERAL VASCULAR CATHETERIZATION N/A 07/30/2015   Procedure: Abdominal Aortogram;  Surgeon: Conrad Makaha, MD;  Location: [place] [facility];  Service: Cardiovascular;  Laterality: N/A;  . PERIPHERAL VASCULAR CATHETERIZATION N/A 10/24/2016   Procedure: Abdominal Aortogram;  Surgeon: Angelia Mould, MD;  Location: [place] [facility];  Service: Cardiovascular;  Laterality: N/A;  . PERIPHERAL VASCULAR CATHETERIZATION N/A 10/24/2016   Procedure: Lower  Extremity Angiography;  Surgeon: Angelia Mould, MD;  Location: [place] [facility];  Service: Cardiovascular;  Laterality: N/A;  . PERIPHERAL VASCULAR INTERVENTION Right 12/11/2017   Procedure: PERIPHERAL VASCULAR INTERVENTION;  Surgeon: Angelia Mould, MD;  Location: [place] [facility];  Service: Cardiovascular;  Laterality: Right;  Marland Kitchen VASCULAR SURGERY  ~ 2007   Stent SFA   . VEIN HARVEST Left 01/06/2016   Procedure: LEFT GREATER SAPPHENOUS VEIN HARVEST;  Surgeon: Mal Misty, MD;  Location: [place];  Service: Vascular;  Laterality: Left;    Allergies  Allergen Reactions  . Glipizide Other (See Comments)    REACTION IS SIDE EFFECT Severe hypoglycemia to 40s.     Current Outpatient Medications  Medication Sig Dispense Refill  . atorvastatin (LIPITOR) 40 MG tablet TAKE 1 TABLET BY MOUTH ONCE DAILY (Patient taking differently: Take 40 mg by mouth at bedtime.) 90 tablet 2  . benazepril (LOTENSIN) 5 MG tablet TAKE 1 TABLET BY MOUTH DAILY (Patient taking differently: Take 5 mg by mouth daily.) 90 tablet 0  . clopidogrel (PLAVIX) 75 MG tablet Take 1 tablet (75 mg total) by mouth daily. 90 tablet 0  . diclofenac Sodium (VOLTAREN) 1 % GEL Apply 2 g topically 4 (four) times daily. 50 g 1  . ezetimibe (ZETIA) 10 MG tablet TAKE 1 TABLET BY MOUTH ONCE DAILY (Patient taking differently: Take 10 mg by mouth daily.) 90 tablet 1  . ferrous sulfate 325 (65 FE) MG EC tablet Take 1 tablet (325 mg total) by mouth daily with breakfast. 90 tablet 1  . FLUoxetine (PROZAC) 10 MG capsule Take 10 mg by mouth daily.    Marland Kitchen gabapentin (NEURONTIN) 300 MG capsule Take 1 capsule (300 mg total) by mouth 2 (two) times daily. 60 capsule 1  . metFORMIN (GLUCOPHAGE) 1000 MG tablet TAKE ONE (1) TABLET BY MOUTH TWICE DAILY WITH MEALS 90 tablet 1  . Nebivolol HCl 20 MG TABS TAKE 1 TABLET BY MOUTH DAILY (Patient taking differently: Take 20 mg by mouth daily.) 90 tablet 0  . oxyCODONE-acetaminophen (PERCOCET/ROXICET)  5-325 MG tablet Take 1 tablet by mouth every 6 (six) hours as needed for moderate pain. 10 tablet 0  . pantoprazole (PROTONIX) 40 MG tablet Take 1 tablet (40 mg total) by mouth daily as needed. (Patient taking differently: Take 40 mg by mouth daily.) 90 tablet 0  . polyethylene glycol (MIRALAX / GLYCOLAX) 17 g packet Take 17 g by mouth daily. 14 each 0  . senna (SENOKOT) 8.6 MG TABS tablet Take 1 tablet (8.6 mg total) by mouth daily. 30 tablet 0   No current facility-administered medications for this visit.    Family History  Problem Relation Age of Onset  .  Cancer Mother        colon Cancer  . Hyperlipidemia Mother   . Diabetes Mother   . Heart disease Father   . Hypertension Father   . Cancer Sister        Uterine  . Diabetes Sister   . Diabetes Brother     Social History   Socioeconomic History  . Marital status: Significant Other    Spouse name: Not on file  . Number of children: 3  . Years of education: 72  . Highest education level: Some college, no degree  Occupational History  . Not on file  Tobacco Use  . Smoking status: Current Some Day Smoker    Years: 24.00    Types: Cigars  . Smokeless tobacco: Never Used  Vaping Use  . Vaping Use: Never used  Substance and Sexual Activity  . Alcohol use: Yes    Alcohol/week: 2.0 standard drinks    Types: 2 Cans of beer per week  . Drug use: No  . Sexual activity: Yes  Other Topics Concern  . Not on file  Social History Narrative   Lives alone. Lives in an apartment. Ground floor apartment. Smoke alarms present, no grab bars.    Has a shih-tzu, named Missy.    Patient eats a variety of foods. Drinks tea, Kool-aid, coffee      Use public transportation or brother will take places.   Some food insecurity.      Social Determinants of Health   Financial Resource Strain: Not on file  Food Insecurity: Not on file  Transportation Needs: Not on file  Physical Activity: Not on file  Stress: Not on file  Social  Connections: Not on file  Intimate Partner Violence: Not on file     REVIEW OF SYSTEMS:   [X]  denotes positive finding, [ ]  denotes negative finding Cardiac  Comments:  Chest pain or chest pressure:    Shortness of breath upon exertion:    Short of breath when lying flat:    Irregular heart rhythm:        Vascular    Pain in calf, thigh, or hip brought on by ambulation:    Pain in feet at night that wakes you up from your sleep:     Blood clot in your veins:    Leg swelling:         Pulmonary    Oxygen at home:    Productive cough:     Wheezing:         Neurologic    Sudden weakness in arms or legs:     Sudden numbness in arms or legs:     Sudden onset of difficulty speaking or slurred speech:    Temporary loss of vision in one eye:     Problems with dizziness:         Gastrointestinal    Blood in stool:     Vomited blood:         Genitourinary    Burning when urinating:     Blood in urine:        Psychiatric    Major depression:         Hematologic    Bleeding problems:    Problems with blood clotting too easily:        Skin    Rashes or ulcers:        Constitutional    Fever or chills:      PHYSICAL EXAMINATION:  Today's  Vitals   05/13/21 0825  BP: (!) 149/71  Pulse: 62  Resp: 20  Temp: 97.9 F (36.6 C)  TempSrc: Temporal  SpO2: 100%  Height: 5\' 7"  (1.702 m)  PainSc: 6    Body mass index is 25.18 kg/m.   General:  WDWN in NAD; vital signs documented above Gait: Not observed HENT: WNL, normocephalic Pulmonary: normal non-labored breathing Cardiac: regular HR, with carotid bruit on the right Skin: without rashes Vascular Exam/Pulses:  Right Left  Radial 2+ (normal) 2+ (normal)   Extremities: without ischemic changes, without Gangrene , without cellulitis; without open wounds Musculoskeletal: no muscle wasting or atrophy  Neurologic: A&O X 3; moving all extremities equally; speech is fluent/normal Psychiatric:  The pt has Normal  affect.   Non-Invasive Vascular Imaging:   Carotid Duplex on 05/12/2021: Right:  1-39% ICA stenosis Left:  1-39% ICA stenosis   Previous Carotid duplex on 01/18/2019: Right: 1-39% ICA stenosis Left:   1-39% ICA stenosis    ASSESSMENT/PLAN:: 70 y.o. male here for follow up right carotid bruit  -duplex today reveals 1-39% bilaterally, which is essentially unchanged from previous duplex 2.5 yrs ago.  He remains asymptomatic.   -discussed s/s of stroke with pt and he understands should he develop any of these sx, he will go to the nearest ER or call 911. -pt will call sooner should they have any issues. -continue statin/asa  -pt will keep his 3 month appt for ABI's - he knows to call sooner if he has any issues.  Leontine Locket, [facility] [phone]  Clinic MD:  Scot Dock

## 2021-05-13 ENCOUNTER — Ambulatory Visit (HOSPITAL_COMMUNITY)
Admission: RE | Admit: 2021-05-13 | Discharge: 2021-05-13 | Disposition: A | Discharge status: Home / Self Care | Payer: Medicare HMO | Source: Ambulatory Visit | Attending: Vascular Surgery | Admitting: Vascular Surgery

## 2021-05-13 ENCOUNTER — Ambulatory Visit (INDEPENDENT_AMBULATORY_CARE_PROVIDER_SITE_OTHER): Payer: Medicare HMO | Admitting: Physician Assistant

## 2021-05-13 ENCOUNTER — Other Ambulatory Visit: Payer: Self-pay | Admitting: Student in an Organized Health Care Education/Training Program

## 2021-05-13 ENCOUNTER — Other Ambulatory Visit: Payer: Self-pay

## 2021-05-13 VITALS — BP 149/71 | HR 62 | Temp 97.9°F | Resp 20 | Ht 67.0 in

## 2021-05-13 DIAGNOSIS — R0989 Other specified symptoms and signs involving the circulatory and respiratory systems: Secondary | ICD-10-CM

## 2021-05-18 ENCOUNTER — Other Ambulatory Visit: Payer: Self-pay | Admitting: Student in an Organized Health Care Education/Training Program

## 2021-05-18 DIAGNOSIS — M5412 Radiculopathy, cervical region: Secondary | ICD-10-CM | POA: Diagnosis not present

## 2021-05-18 DIAGNOSIS — M1990 Unspecified osteoarthritis, unspecified site: Secondary | ICD-10-CM | POA: Diagnosis not present

## 2021-05-18 DIAGNOSIS — Z89612 Acquired absence of left leg above knee: Secondary | ICD-10-CM | POA: Diagnosis not present

## 2021-05-18 DIAGNOSIS — Z4781 Encounter for orthopedic aftercare following surgical amputation: Secondary | ICD-10-CM | POA: Diagnosis not present

## 2021-05-18 DIAGNOSIS — R911 Solitary pulmonary nodule: Secondary | ICD-10-CM | POA: Diagnosis not present

## 2021-05-18 DIAGNOSIS — I10 Essential (primary) hypertension: Secondary | ICD-10-CM | POA: Diagnosis not present

## 2021-05-18 DIAGNOSIS — G8929 Other chronic pain: Secondary | ICD-10-CM | POA: Diagnosis not present

## 2021-05-18 DIAGNOSIS — E1140 Type 2 diabetes mellitus with diabetic neuropathy, unspecified: Secondary | ICD-10-CM | POA: Diagnosis not present

## 2021-05-18 DIAGNOSIS — E1151 Type 2 diabetes mellitus with diabetic peripheral angiopathy without gangrene: Secondary | ICD-10-CM | POA: Diagnosis not present

## 2021-05-18 DIAGNOSIS — D509 Iron deficiency anemia, unspecified: Secondary | ICD-10-CM | POA: Diagnosis not present

## 2021-05-22 DIAGNOSIS — R531 Weakness: Secondary | ICD-10-CM | POA: Diagnosis not present

## 2021-05-22 DIAGNOSIS — Z89612 Acquired absence of left leg above knee: Secondary | ICD-10-CM | POA: Diagnosis not present

## 2021-05-24 ENCOUNTER — Other Ambulatory Visit: Payer: Self-pay | Admitting: Student in an Organized Health Care Education/Training Program

## 2021-05-24 ENCOUNTER — Other Ambulatory Visit: Payer: Self-pay | Admitting: Family Medicine

## 2021-05-24 ENCOUNTER — Other Ambulatory Visit: Payer: Self-pay | Admitting: Physician Assistant

## 2021-05-25 DIAGNOSIS — Z89612 Acquired absence of left leg above knee: Secondary | ICD-10-CM | POA: Diagnosis not present

## 2021-05-25 DIAGNOSIS — D509 Iron deficiency anemia, unspecified: Secondary | ICD-10-CM | POA: Diagnosis not present

## 2021-05-25 DIAGNOSIS — E1140 Type 2 diabetes mellitus with diabetic neuropathy, unspecified: Secondary | ICD-10-CM | POA: Diagnosis not present

## 2021-05-25 DIAGNOSIS — R911 Solitary pulmonary nodule: Secondary | ICD-10-CM | POA: Diagnosis not present

## 2021-05-25 DIAGNOSIS — M1990 Unspecified osteoarthritis, unspecified site: Secondary | ICD-10-CM | POA: Diagnosis not present

## 2021-05-25 DIAGNOSIS — M5412 Radiculopathy, cervical region: Secondary | ICD-10-CM | POA: Diagnosis not present

## 2021-05-25 DIAGNOSIS — G8929 Other chronic pain: Secondary | ICD-10-CM | POA: Diagnosis not present

## 2021-05-25 DIAGNOSIS — I10 Essential (primary) hypertension: Secondary | ICD-10-CM | POA: Diagnosis not present

## 2021-05-25 DIAGNOSIS — Z4781 Encounter for orthopedic aftercare following surgical amputation: Secondary | ICD-10-CM | POA: Diagnosis not present

## 2021-05-25 DIAGNOSIS — E1151 Type 2 diabetes mellitus with diabetic peripheral angiopathy without gangrene: Secondary | ICD-10-CM | POA: Diagnosis not present

## 2021-06-07 ENCOUNTER — Other Ambulatory Visit: Payer: Self-pay

## 2021-06-07 ENCOUNTER — Encounter: Payer: Self-pay | Admitting: Student in an Organized Health Care Education/Training Program

## 2021-06-07 ENCOUNTER — Ambulatory Visit (INDEPENDENT_AMBULATORY_CARE_PROVIDER_SITE_OTHER): Payer: Medicare HMO | Admitting: Student in an Organized Health Care Education/Training Program

## 2021-06-07 VITALS — BP 146/88 | HR 73 | Wt 160.8 lb

## 2021-06-07 DIAGNOSIS — R197 Diarrhea, unspecified: Secondary | ICD-10-CM | POA: Diagnosis not present

## 2021-06-07 DIAGNOSIS — R195 Other fecal abnormalities: Secondary | ICD-10-CM | POA: Diagnosis not present

## 2021-06-07 DIAGNOSIS — R7303 Prediabetes: Secondary | ICD-10-CM

## 2021-06-07 NOTE — Assessment & Plan Note (Addendum)
Other than daily loose stools, patient has no symptoms or significant physical exam findings.  Discussed commonly this can be side effect of metformin and he has no other usual contributing medications on his medication list.  Will trial off metformin for a week. Asked patient to return if not improving with this trial.  If continues, could consider infectious diarrhea workup vs malabsorption.  BMP today as patient could have electrolyte abnormalities in setting of diarrhea and had mild hyponatremia recently and CBC for signs of infection

## 2021-06-07 NOTE — Progress Notes (Signed)
   SUBJECTIVE:   CHIEF COMPLAINT / HPI: loose stools  Loose stools- Ongoing for about a month. 80% of Bms are loose. Twice a day Bms. Regular brown color. No blood. No abdominal cramping or pain with BMs. No weight loss. No N/V. Otherwise feels normal besides the loose stools.  Has not tried any treatments yet.  Not taking any laxitives.  Takes metformin 1,000mg  BID. No changes recently.   OBJECTIVE:   BP (!) 146/88   Pulse 73   Wt 160 lb 12.8 oz (72.9 kg)   SpO2 97%   BMI 25.18 kg/m   Physical Exam Vitals and nursing note reviewed.  Constitutional:      General: He is not in acute distress.    Appearance: Normal appearance. He is not ill-appearing or toxic-appearing.  Cardiovascular:     Rate and Rhythm: Normal rate and regular rhythm.  Abdominal:     General: Abdomen is flat. Bowel sounds are normal. There is no distension.     Palpations: Abdomen is soft. There is no hepatomegaly or mass.     Tenderness: There is no abdominal tenderness.     Hernia: No hernia is present.  Skin:    General: Skin is warm and dry.     Capillary Refill: Capillary refill takes less than 2 seconds.  Neurological:     General: No focal deficit present.     Mental Status: He is alert.  Psychiatric:        Mood and Affect: Mood normal.        Behavior: Behavior normal.   ASSESSMENT/PLAN:   Loose stools Other than daily loose stools, patient has no symptoms or significant physical exam findings.  Discussed commonly this can be side effect of metformin and he has no other usual contributing medications on his medication list.  Will trial off metformin for a week. Asked patient to return if not improving with this trial.  If continues, could consider infectious diarrhea workup vs malabsorption.  BMP today as patient could have electrolyte abnormalities in setting of diarrhea and had mild hyponatremia recently and CBC for signs of infection   A1c for diabetes monitoring. Has decreased weight  since LE amputation.   [place]

## 2021-06-07 NOTE — Patient Instructions (Signed)
It was a pleasure to see you today!  To summarize our discussion for this visit: I'm sorry to hear that you've been having loose stools. Let's first check to make sure we aren't causing this with medications. STOP taking your metformin and monitor for improvements over the next week. If not getting better, we can discuss other causes such as infection or diet.  We are also checking A1c, CBC and sodium today.  Some additional health maintenance measures we should update are: Health Maintenance Due  Topic Date Due   Zoster Vaccines- Shingrix (1 of 2) Never done   FOOT EXAM  01/19/2020     Please return to our clinic to see me as needed.  Call the clinic at [phone] if your symptoms worsen or you have any concerns.   Thank you for allowing me to take part in your care,  Dr. Doristine Mango

## 2021-06-08 LAB — BASIC METABOLIC PANEL
BUN/Creatinine Ratio: 12 (ref 10–24)
BUN: 10 mg/dL (ref 8–27)
CO2: 21 mmol/L (ref 20–29)
Calcium: 9.7 mg/dL (ref 8.6–10.2)
Chloride: 106 mmol/L (ref 96–106)
Creatinine, Ser: 0.84 mg/dL (ref 0.76–1.27)
Glucose: 98 mg/dL (ref 65–99)
Potassium: 4.1 mmol/L (ref 3.5–5.2)
Sodium: 142 mmol/L (ref 134–144)
eGFR: 94 mL/min/{1.73_m2} (ref >59)

## 2021-06-08 LAB — CBC
Hematocrit: 39.4 % (ref 37.5–51.0)
Hemoglobin: 12.2 g/dL — ABNORMAL LOW (ref 13.0–17.7)
MCH: 22.9 pg — ABNORMAL LOW (ref 26.6–33.0)
MCHC: 31 g/dL — ABNORMAL LOW (ref 31.5–35.7)
MCV: 74 fL — ABNORMAL LOW (ref 79–97)
Platelets: 281 10*3/uL (ref 150–450)
RBC: 5.33 x10E6/uL (ref 4.14–5.80)
RDW: 17.2 % — ABNORMAL HIGH (ref 11.6–15.4)
WBC: 6.9 10*3/uL (ref 3.4–10.8)

## 2021-06-08 LAB — HEMOGLOBIN A1C
Est. average glucose Bld gHb Est-mCnc: 134 mg/dL
Hgb A1c MFr Bld: 6.3 % — ABNORMAL HIGH (ref 4.8–5.6)

## 2021-06-10 DIAGNOSIS — M5412 Radiculopathy, cervical region: Secondary | ICD-10-CM | POA: Diagnosis not present

## 2021-06-10 DIAGNOSIS — E1151 Type 2 diabetes mellitus with diabetic peripheral angiopathy without gangrene: Secondary | ICD-10-CM | POA: Diagnosis not present

## 2021-06-10 DIAGNOSIS — G8929 Other chronic pain: Secondary | ICD-10-CM | POA: Diagnosis not present

## 2021-06-10 DIAGNOSIS — I10 Essential (primary) hypertension: Secondary | ICD-10-CM | POA: Diagnosis not present

## 2021-06-10 DIAGNOSIS — D509 Iron deficiency anemia, unspecified: Secondary | ICD-10-CM | POA: Diagnosis not present

## 2021-06-10 DIAGNOSIS — R911 Solitary pulmonary nodule: Secondary | ICD-10-CM | POA: Diagnosis not present

## 2021-06-10 DIAGNOSIS — M1990 Unspecified osteoarthritis, unspecified site: Secondary | ICD-10-CM | POA: Diagnosis not present

## 2021-06-10 DIAGNOSIS — Z4781 Encounter for orthopedic aftercare following surgical amputation: Secondary | ICD-10-CM | POA: Diagnosis not present

## 2021-06-10 DIAGNOSIS — Z89612 Acquired absence of left leg above knee: Secondary | ICD-10-CM | POA: Diagnosis not present

## 2021-06-10 DIAGNOSIS — E1140 Type 2 diabetes mellitus with diabetic neuropathy, unspecified: Secondary | ICD-10-CM | POA: Diagnosis not present

## 2021-06-22 DIAGNOSIS — Z89612 Acquired absence of left leg above knee: Secondary | ICD-10-CM | POA: Diagnosis not present

## 2021-06-22 DIAGNOSIS — R531 Weakness: Secondary | ICD-10-CM | POA: Diagnosis not present

## 2021-07-22 DIAGNOSIS — Z89612 Acquired absence of left leg above knee: Secondary | ICD-10-CM | POA: Diagnosis not present

## 2021-07-22 DIAGNOSIS — R531 Weakness: Secondary | ICD-10-CM | POA: Diagnosis not present

## 2021-07-26 ENCOUNTER — Other Ambulatory Visit: Payer: Self-pay | Admitting: Student in an Organized Health Care Education/Training Program

## 2021-07-26 DIAGNOSIS — K219 Gastro-esophageal reflux disease without esophagitis: Secondary | ICD-10-CM

## 2021-07-26 DIAGNOSIS — I10 Essential (primary) hypertension: Secondary | ICD-10-CM

## 2021-07-26 DIAGNOSIS — E7800 Pure hypercholesterolemia, unspecified: Secondary | ICD-10-CM

## 2021-08-03 ENCOUNTER — Ambulatory Visit: Payer: Medicare HMO

## 2021-08-03 ENCOUNTER — Encounter (HOSPITAL_COMMUNITY): Payer: Medicare HMO

## 2021-08-04 ENCOUNTER — Encounter: Payer: Self-pay | Admitting: Student

## 2021-08-04 ENCOUNTER — Ambulatory Visit (INDEPENDENT_AMBULATORY_CARE_PROVIDER_SITE_OTHER): Payer: Medicare HMO | Admitting: Student

## 2021-08-04 ENCOUNTER — Other Ambulatory Visit: Payer: Self-pay

## 2021-08-04 VITALS — Ht 67.0 in | Wt 172.0 lb

## 2021-08-04 DIAGNOSIS — Z09 Encounter for follow-up examination after completed treatment for conditions other than malignant neoplasm: Secondary | ICD-10-CM

## 2021-08-04 NOTE — Progress Notes (Signed)
    SUBJECTIVE:   CHIEF COMPLAINT / HPI: Mobility examination for Hoveround request  PERTINENT  PMH / PSH: PAD, HTN, T2DM, Hx of limb ischemia s/p L AKA, GERD, constipation, OSA, tobacco abuse, iron deficiency anemia, depression  Diagnosis that impact patients mobility Pt had a L AKA on 02/19/2021 due to ulceration of his left third toe and dry gangrene. He has had phantom limb pain and sustained a fall when transitioning from bathroom into wheel chair since the amputation.   Patient has difficulty using his walker as he had a left AKA on 02/19/2021.  He can stand up with it for about 3 minutes and can walk about 15 feet with it but then needs to rest. He states he has fallen twice using the walker because he lost balance. Patient has difficultly using his manual wheelchair due to arthritic pain in bilateral hands and left shoulder pain due to bicep tendonitis which he rates at a 9/10 at its worst.  Patient last saw his orthopedist April 2022 and was recommended to have biceps tendon sheath injection for left shoulder biceps tendinitis.  Patient states he did have an injection and that it helped with his pain for 3 months.  He has not had another injection since.    Pt has had home PT after his AKA.  Patient states he did home PT with his manual wheelchair and that PT signed off.  OBJECTIVE:   Today's Vitals   08/04/21 1455  Weight: 172 lb (78 kg)  Height: '5\' 7"'$  (1.702 m)  PainSc: 0-No pain   Body mass index is 26.94 kg/m.   General: NAD, pleasant, able to participate in exam Cardiac: RRR, no murmurs. Respiratory: CTAB, normal effort, No wheezes, rales or rhonchi Extremities: no edema or cyanosis. AKA of L leg MSK: RUE (5/5) LUE (5/5) RLE (5/5) LLE she like any 5/5) full ROM of bilateral UEs Neuro: Cranial nerves II through XII intact, sensation intact Psych: Normal affect and mood  ASSESSMENT/PLAN:   Mobility As patient has 5/5 muscle strength in all extremities and full ROM of  bilateral UEs and completed home PT, I recommend that he continue to work on his abilities with his Manual wheelchair.  -follow-up with orthopedist regarding his left shoulder pain due to tendinitis. -Consider a referral for more PT in the future if patient is willing -follow-up with me if pain continues to impede his ability to utilize his manual wheelchair.   At this time, I do not recommend a Hoveround for this patient as it would drastically decrease the physical activity he is able to get with his manual wheel chair which would not be positive for his overall health.   Dr. Precious Gilding, [place]

## 2021-08-04 NOTE — Patient Instructions (Signed)
It was great to see you! Thank you for allowing me to participate in your care!  I recommend that you always bring your medications to each appointment as this makes it easy to ensure you are on the correct medications and helps [place] not miss when refills are needed.  Our plans for today:  -Please reach out if you would like a referral to physical therapy to be evaluated for mobility and in a manual wheelchair -Please continue to follow-up with the orthopedist about your left shoulder pain.  If this pain persists, please let me know.   Take care and seek immediate care sooner if you develop any concerns.   Dr. Precious Gilding, DO [facility]

## 2021-08-09 ENCOUNTER — Telehealth: Payer: Self-pay

## 2021-08-09 DIAGNOSIS — K219 Gastro-esophageal reflux disease without esophagitis: Secondary | ICD-10-CM

## 2021-08-09 NOTE — Telephone Encounter (Signed)
Patient calls nurse line reporting increased acid reflux after he takes his medications and eats food. Patient reports he takes (1) '20mg'$  Pantoprazole daily and would like to increase his dose if possible. Patient denies any nausea or vomiting. Patient scheduled for first available on 8/23 in [facility]. Please advise on dosage change in the meantime for possible relief.

## 2021-08-10 DIAGNOSIS — Z89612 Acquired absence of left leg above knee: Secondary | ICD-10-CM | POA: Diagnosis not present

## 2021-08-10 MED ORDER — PANTOPRAZOLE SODIUM 40 MG PO TBEC: 40.0000 mg | DELAYED_RELEASE_TABLET | Freq: Every day | ORAL | 3 refills | Status: DC | PRN

## 2021-08-10 NOTE — Telephone Encounter (Signed)
Sent in prescription of 40 mg pantoprazole (previously on 20 mg) per patient request.  Precious Gilding, DO

## 2021-08-10 NOTE — Telephone Encounter (Signed)
Patient informed. 

## 2021-08-11 ENCOUNTER — Other Ambulatory Visit: Payer: Self-pay | Admitting: Student in an Organized Health Care Education/Training Program

## 2021-08-11 NOTE — Telephone Encounter (Signed)
Received request for gabapentin refill.  Spoke with patient on the phone regarding how he takes his gabapentin as it has been prescribed in various ways in the recent past.  Patient states he takes two capsules of gabapentin 300 mg before bed which is how I prescribed it today.

## 2021-08-17 ENCOUNTER — Other Ambulatory Visit: Payer: Self-pay

## 2021-08-17 ENCOUNTER — Ambulatory Visit (INDEPENDENT_AMBULATORY_CARE_PROVIDER_SITE_OTHER): Payer: Medicare HMO | Admitting: Physician Assistant

## 2021-08-17 ENCOUNTER — Ambulatory Visit (HOSPITAL_COMMUNITY)
Admission: RE | Admit: 2021-08-17 | Discharge: 2021-08-17 | Disposition: A | Discharge status: Home / Self Care | Payer: Medicare HMO | Source: Ambulatory Visit | Attending: Vascular Surgery | Admitting: Vascular Surgery

## 2021-08-17 ENCOUNTER — Ambulatory Visit (INDEPENDENT_AMBULATORY_CARE_PROVIDER_SITE_OTHER): Payer: Medicare HMO | Admitting: Family Medicine

## 2021-08-17 VITALS — BP 136/70 | HR 68 | Ht 67.0 in | Wt 174.6 lb

## 2021-08-17 VITALS — BP 142/72 | HR 60 | Temp 98.2°F | Resp 20 | Ht 67.0 in

## 2021-08-17 DIAGNOSIS — I70229 Atherosclerosis of native arteries of extremities with rest pain, unspecified extremity: Secondary | ICD-10-CM | POA: Diagnosis not present

## 2021-08-17 DIAGNOSIS — Z0000 Encounter for general adult medical examination without abnormal findings: Secondary | ICD-10-CM | POA: Diagnosis not present

## 2021-08-17 DIAGNOSIS — I779 Disorder of arteries and arterioles, unspecified: Secondary | ICD-10-CM

## 2021-08-17 DIAGNOSIS — K219 Gastro-esophageal reflux disease without esophagitis: Secondary | ICD-10-CM

## 2021-08-17 DIAGNOSIS — Z23 Encounter for immunization: Secondary | ICD-10-CM | POA: Diagnosis not present

## 2021-08-17 MED ORDER — ZOSTER VAC RECOMB ADJUVANTED 50 MCG/0.5ML IM SUSR: 0.5000 mL | Freq: Once | INTRAMUSCULAR | 0 refills | Status: AC

## 2021-08-17 NOTE — Assessment & Plan Note (Addendum)
-  Per patient, foot exam completed this morning 8/23 at vascular vein specialist in [place].  Full feeling intact, no deficits detected. - Shingrix vaccine sent to pharmacy.  Sent to different scripts, one for now, one to start 10/23 (at [facility]) - Otherwise up-to-date

## 2021-08-17 NOTE — Assessment & Plan Note (Signed)
Previous breakthrough symptoms on pantoprazole 20 mg. PCP increased to 40 mg 08/09/21 with good response. Symptoms resolved. Patient pleased with results. No further concerns at this time.

## 2021-08-17 NOTE — Patient Instructions (Signed)
It was wonderful to meet you today. Thank you for allowing me to be a part of your care. Below is a short summary of what we discussed at your visit today:  Acid reflux I am so glad to hear that your new medication dose is working very well for you!  Please let us know if you have a return of symptoms or if you need an adjustment.  Shingles vaccine I have sent the prescription for the shingles vaccine to your pharmacy.  This is a 2 dose vaccine series.  You will need to get your second shot within 2 to 6 months of your first shot.  I have sent in 2 prescriptions for the vaccine; one for now and one for shot #2.  Your pharmacy should have these on file and be able to administer them whenever you would like.     If you have any questions or concerns, please do not hesitate to contact us via phone or MyChart message.   Ezequiel Essex, MD

## 2021-08-17 NOTE — Progress Notes (Signed)
        SUBJECTIVE:   CHIEF COMPLAINT / HPI:   GERD - Called nurse line 8/15 reporting increased acid reflux after medications and food - Previous dose pantoprazole 20 mg daily, increased by PCP to 40 mg - No nausea or vomiting - Patient today reports increase of pantoprazole worked quite well, symptoms have resolved - No other complaints or concerns  Health maintenance - Had foot exam at other doctor office this morning. Leontine Locket, PA-C with [facility] vascular and vein specialists).  No areas of numbness or paresthesia.  - Due for shingles vaccine.  Discussed, patient amenable.  We will send prescription to pharmacy.  PERTINENT  PMH / PSH: T2DM, HTN, PAD, peripheral arterial occlusive disease, GERD, fatty liver, diabetic neuropathy, left AKA, tobacco use, iron deficiency anemia  OBJECTIVE:   Blood pressure 136/70, pulse 68, height '5\' 7"'$  (1.702 m), weight 174 lb 9.6 oz (79.2 kg), SpO2 98 %.   PHQ-9:  Depression screen [facility] 2/9 08/17/2021 08/04/2021 06/07/2021  Decreased Interest 0 0 0  Down, Depressed, Hopeless 0 0 0  PHQ - 2 Score 0 0 0  Altered sleeping 0 1 0  Tired, decreased energy 0 0 0  Change in appetite 0 0 0  Feeling bad or failure about yourself  0 0 0  Trouble concentrating 0 0 0  Moving slowly or fidgety/restless 0 0 0  Suicidal thoughts 0 0 0  PHQ-9 Score 0 1 0  Difficult doing work/chores - Somewhat difficult -  Some recent data might be hidden     GAD-7: No flowsheet data found.   Physical Exam General: Awake, alert, oriented Cardiovascular: Regular rate and rhythm, S1 and S2 present, no murmurs auscultated Respiratory: Lung fields clear to auscultation bilaterally Extremities: Left AKA present Neuro: Cranial nerves II through X grossly intact, able to move all extremities spontaneously   ASSESSMENT/PLAN:   Gastroesophageal reflux disease Previous breakthrough symptoms on pantoprazole 20 mg. PCP increased to 40 mg 08/09/21 with good response.  Symptoms resolved. Patient pleased with results. No further concerns at this time.   Healthcare maintenance -Per patient, foot exam completed this morning 8/23 at vascular vein specialist in [place].  Full feeling intact, no deficits detected. - Shingrix vaccine sent to pharmacy.  Sent to different scripts, one for now, one to start 10/23 (at 62-monthmark) - Otherwise up-to-date     CEzequiel Essex MD CIron Belt

## 2021-08-17 NOTE — Progress Notes (Signed)
HISTORY AND PHYSICAL     CC:  follow up. Requesting Provider:  Richarda Osmond, DO  HPI: This is a 70 y.o. male who is here today for follow up for PAD.   He hx of left AKA on 02/19/2021 by Dr. Stanford Breed.  He has hx of right femoral endarterectomy for ischemic rest pain on 04/24/2020 by Dr. Carlis Abbott and hx of right EIA stent with 7 mm x 29 mm VBX in 2018 by Dr. Scot Dock.  Pt was last seen in May and at that time, he was doing well without rest pain or wounds.  He did not have any stroke sx.   The pt returns today for follow up.  He states that he is doing well.   He does not have any rest pain or non healing wounds.  He denies any stroke sx.  He recently got his prosthesis for his left leg.    The pt is on a statin for cholesterol management.    The pt is not on an aspirin.    Other AC:  Plavix The pt is on ACEI for hypertension.  The pt does have diabetes. 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    Past Medical History:  Diagnosis Date   Adhesive capsulitis 03/10/2020   Anemia    Angiodysplasia of small intestine ([place]) 03/29/2018   Enteroscopy was significant for angiodysplasia 03/29/2018   Arthritis    OA   Cervical radiculopathy    Dr. Vertell Limber neurosurgery   Chronic lower back pain    Claudication of both lower extremities ([place]) 07/15/2015   Critical lower limb ischemia ([place]) 12/19/2019   Diabetes mellitus    takes Metformin daily   GERD (gastroesophageal reflux disease)    takes Protonix daily   History of blood transfusion    "related to low HgB" ((09/10/2015   Hyperlipidemia    takes Vytorin daily   Hypertension    takes Benazepril and Bystolic daily   PAD (peripheral artery disease) ([place])    Pneumonia    Radiculopathy, cervical region 02/11/2017   Shortness of breath dyspnea    with exertion   Tobacco user    Toe fracture, right 05/09/2011   Wears glasses     Past Surgical History:  Procedure Laterality Date    ABDOMINAL AORTOGRAM W/LOWER EXTREMITY N/A 12/11/2017   Procedure: ABDOMINAL AORTOGRAM W/LOWER EXTREMITY;  Surgeon: Angelia Mould, MD;  Location: [place] [facility];  Service: Cardiovascular;  Laterality: N/A;   ABDOMINAL AORTOGRAM W/LOWER EXTREMITY Bilateral 04/22/2020   Procedure: ABDOMINAL AORTOGRAM W/LOWER EXTREMITY;  Surgeon: Marty Heck, MD;  Location: [place] [facility];  Service: Cardiovascular;  Laterality: Bilateral;   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   AMPUTATION Left 02/19/2021   Procedure: LEFT ABOVE KNEE AMPUTATION;  Surgeon: Cherre Robins, MD;  Location: [place];  Service: Vascular;  Laterality: Left;   ANTERIOR CERVICAL DECOMP/DISCECTOMY FUSION  03/08/12   C6-7   ANTERIOR CERVICAL DECOMP/DISCECTOMY FUSION  03/08/2012   Procedure: ANTERIOR CERVICAL DECOMPRESSION/DISCECTOMY FUSION 1 LEVEL/HARDWARE REMOVAL;  Surgeon: Erline Levine, MD;  Location: [place] [facility];  Service: Neurosurgery;  Laterality: N/A;  revison of C5-7 anterior cervical decompression with fusion with Cervical Five-Thoracic One anterior cervical decompression with fusion with interbody prothesis plating and bonegraft   [place]   BIOPSY  12/05/2020   Procedure: BIOPSY;  Surgeon: Ladene Artist, MD;  Location: [facility]  ENDOSCOPY;  Service: Endoscopy;;   BYPASS GRAFT FEMORAL-PERONEAL Left 11/11/2016   Procedure: REDO LEFT FEMORAL-PERONEAL BYPASS WITH PROPATEN 6MM X 80CM GRAFT;  Surgeon: Angelia Mould, MD;  Location: [place];  Service: Vascular;  Laterality: Left;   COLONOSCOPY W/ BIOPSIES AND POLYPECTOMY  08/17/2012   f/u 5 years, 4 polyps, no high grade dysplasia or malignancy, tubular adenoma, hyperplastic polyops   COLONOSCOPY WITH PROPOFOL N/A 12/05/2020   Procedure: COLONOSCOPY WITH PROPOFOL;  Surgeon: Ladene Artist, MD;  Location: [facility] ENDOSCOPY;  Service: Endoscopy;  Laterality: N/A;   EMBOLECTOMY Left 12/19/2019   Procedure: LEFT FEMERAL- PERONEAL THROMBECTOMY;  Surgeon: Marty Heck, MD;  Location: [place];  Service: Vascular;  Laterality: Left;   ENDARTERECTOMY FEMORAL Right 04/24/2020   Procedure: RIGHT FEMORAL ENDARTERECTOMY;  Surgeon: Marty Heck, MD;  Location: [place];  Service: Vascular;  Laterality: Right;   ENTEROSCOPY N/A 12/11/2015   Procedure: ENTEROSCOPY;  Surgeon: Carol Ada, MD;  Location: [place];  Service: Endoscopy;  Laterality: N/A;   ENTEROSCOPY N/A 03/29/2018   Procedure: ENTEROSCOPY;  Surgeon: Carol Ada, MD;  Location: [place];  Service: Endoscopy;  Laterality: N/A;   ESOPHAGOGASTRODUODENOSCOPY  08/17/2012   normal esophagus and GEJ, diffuse gastritis with erythema- no malignancy, reactive gastropathy  with focal intestinal metaplasia   ESOPHAGOGASTRODUODENOSCOPY (EGD) WITH PROPOFOL N/A 12/05/2020   Procedure: ESOPHAGOGASTRODUODENOSCOPY (EGD) WITH PROPOFOL;  Surgeon: Ladene Artist, MD;  Location: [facility] ENDOSCOPY;  Service: Endoscopy;  Laterality: N/A;   FEMORAL-POPLITEAL BYPASS GRAFT Left 01/06/2016   Procedure: Left  COMMON FEMORAL-BELOW KNEE POPLITEAL ARTERY Bypass using non-reversed translocated saphenous vein graft from left leg;  Surgeon: Mal Misty, MD;  Location: [place];  Service: Vascular;  Laterality: Left;   GIVENS CAPSULE STUDY N/A 11/24/2015   Procedure: GIVENS CAPSULE STUDY;  Surgeon: Juanita Craver, MD;  Location: [place];  Service: Endoscopy;  Laterality: N/A;   HOT HEMOSTASIS N/A 03/29/2018   Procedure: HOT HEMOSTASIS (ARGON PLASMA COAGULATION/BICAP);  Surgeon: Carol Ada, MD;  Location: [place];  Service: Endoscopy;  Laterality: N/A;   INGUINAL HERNIA REPAIR  1990's   right   INTRAOPERATIVE ARTERIOGRAM Left 01/06/2016   Procedure: INTRA OPERATIVE ARTERIOGRAM LEFT LOWER LEG;  Surgeon: Mal Misty, MD;  Location: [place];  Service: Vascular;  Laterality: Left;   INTRAOPERATIVE ARTERIOGRAM Left 11/11/2016   Procedure: INTRA OPERATIVE ARTERIOGRAM LEFT LOWER EXTRIMITY;  Surgeon: Angelia Mould, MD;  Location: [place];  Service: Vascular;  Laterality: Left;   IR ANGIOGRAM FOLLOW UP STUDY  12/11/2017   IR GENERIC HISTORICAL  10/24/2016   IR ANGIOGRAM FOLLOW UP STUDY   LOWER EXTREMITY ANGIOGRAM Bilateral 07/30/2015   Procedure: Lower Extremity Angiogram;  Surgeon: Conrad [place], MD;  Location: [place] CV [facility];  Service: Cardiovascular;  Laterality: Bilateral;   [place]   "lower"   PATCH ANGIOPLASTY Left 12/19/2019   Procedure: LEFT FEMORAL -PERONEAL PATCH ANGIOPLASTY WITH XENOSURE BIOLOGIC PATCH  ;  Surgeon: Marty Heck, MD;  Location: [place];  Service: Vascular;  Laterality: Left;   PATCH ANGIOPLASTY Right 04/24/2020   Procedure: Patch Angioplasty of Right Femoral Artery using Long Xenosure Biologic Patch 1cm x 14 cm;  Surgeon: Marty Heck, MD;  Location: [place];  Service: Vascular;  Laterality: Right;   PERIPHERAL VASCULAR CATHETERIZATION N/A 07/30/2015   Procedure: Abdominal Aortogram;  Surgeon: Conrad Harleigh, MD;  Location: [place] [facility];  Service: Cardiovascular;  Laterality: N/A;  PERIPHERAL VASCULAR CATHETERIZATION N/A 10/24/2016   Procedure: Abdominal Aortogram;  Surgeon: Angelia Mould, MD;  Location: [place] CV [facility];  Service: Cardiovascular;  Laterality: N/A;   PERIPHERAL VASCULAR CATHETERIZATION N/A 10/24/2016   Procedure: Lower Extremity Angiography;  Surgeon: Angelia Mould, MD;  Location: [place] CV [facility];  Service: Cardiovascular;  Laterality: N/A;   PERIPHERAL VASCULAR INTERVENTION Right 12/11/2017   Procedure: PERIPHERAL VASCULAR INTERVENTION;  Surgeon: Angelia Mould, MD;  Location: [place] [facility];  Service: Cardiovascular;  Laterality: Right;   VASCULAR SURGERY  ~ 2007   Stent SFA    VEIN HARVEST Left 01/06/2016   Procedure: LEFT GREATER SAPPHENOUS VEIN HARVEST;  Surgeon: Mal Misty, MD;  Location: [place];  Service: Vascular;  Laterality: Left;    Allergies  Allergen Reactions    Glipizide Other (See Comments)    REACTION IS SIDE EFFECT Severe hypoglycemia to 40s.     Current Outpatient Medications  Medication Sig Dispense Refill   atorvastatin (LIPITOR) 40 MG tablet TAKE 1 TABLET EVERY DAY 30 tablet 3   benazepril (LOTENSIN) 5 MG tablet TAKE 1 TABLET EVERY DAY 30 tablet 0   clopidogrel (PLAVIX) 75 MG tablet TAKE 1 TABLET BY MOUTH EVERY DAY 90 tablet 0   diclofenac Sodium (VOLTAREN) 1 % GEL Apply 2 g topically 4 (four) times daily. 50 g 1   ezetimibe (ZETIA) 10 MG tablet TAKE 1 TABLET EVERY DAY 30 tablet 0   ferrous sulfate 325 (65 FE) MG EC tablet Take 1 tablet (325 mg total) by mouth daily with breakfast. 90 tablet 1   FLUoxetine (PROZAC) 10 MG capsule Take 10 mg by mouth daily.     gabapentin (NEURONTIN) 300 MG capsule Take 2 capsules at bedtime as needed. 90 capsule 1   metFORMIN (GLUCOPHAGE) 1000 MG tablet TAKE ONE (1) TABLET BY MOUTH TWICE DAILY WITH MEALS 90 tablet 1   Nebivolol HCl 20 MG TABS TAKE 1 TABLET EVERY DAY 30 tablet 0   oxyCODONE-acetaminophen (PERCOCET/ROXICET) 5-325 MG tablet Take 1 tablet by mouth every 6 (six) hours as needed for moderate pain. 10 tablet 0   pantoprazole (PROTONIX) 40 MG tablet Take 1 tablet (40 mg total) by mouth daily as needed. 30 tablet 3   polyethylene glycol (MIRALAX / GLYCOLAX) 17 g packet Take 17 g by mouth daily. 14 each 0   No current facility-administered medications for this visit.    Family History  Problem Relation Age of Onset   Cancer Mother        colon Cancer   Hyperlipidemia Mother    Diabetes Mother    Heart disease Father    Hypertension Father    Cancer Sister        Uterine   Diabetes Sister    Diabetes Brother     Social History   Socioeconomic History   Marital status: Significant Other    Spouse name: Not on file   Number of children: 3   Years of education: 14   Highest education level: Some college, no degree  Occupational History   Not on file  Tobacco Use   Smoking status:  Some Days    Types: Cigars   Smokeless tobacco: Never  Vaping Use   Vaping Use: Never used  Substance and Sexual Activity   Alcohol use: Yes    Alcohol/week: 2.0 standard drinks    Types: 2 Cans of beer per week   Drug use: No   Sexual activity: Yes  Other Topics Concern   Not on file  Social History Narrative   Lives alone. Lives in an apartment. Ground floor apartment. Smoke alarms present, no grab bars.    Has a shih-tzu, named Missy.    Patient eats a variety of foods. Drinks tea, Kool-aid, coffee      Use public transportation or brother will take places.   Some food insecurity.      Social Determinants of Health   Financial Resource Strain: Not on file  Food Insecurity: Not on file  Transportation Needs: Not on file  Physical Activity: Not on file  Stress: Not on file  Social Connections: Not on file  Intimate Partner Violence: Not on file     REVIEW OF SYSTEMS:   '[X]'$  denotes positive finding, '[ ]'$  denotes negative finding Cardiac  Comments:  Chest pain or chest pressure:    Shortness of breath upon exertion:    Short of breath when lying flat:    Irregular heart rhythm:        Vascular    Pain in calf, thigh, or hip brought on by ambulation:    Pain in feet at night that wakes you up from your sleep:     Blood clot in your veins:    Leg swelling:         Pulmonary    Oxygen at home:    Productive cough:     Wheezing:         Neurologic    Sudden weakness in arms or legs:     Sudden numbness in arms or legs:     Sudden onset of difficulty speaking or slurred speech:    Temporary loss of vision in one eye:     Problems with dizziness:         Gastrointestinal    Blood in stool:     Vomited blood:         Genitourinary    Burning when urinating:     Blood in urine:        Psychiatric    Major depression:         Hematologic    Bleeding problems:    Problems with blood clotting too easily:        Skin    Rashes or ulcers:         Constitutional    Fever or chills:      PHYSICAL EXAMINATION:  Today's Vitals   08/17/21 0820  BP: (!) 142/72  Pulse: 60  Resp: 20  Temp: 98.2 F (36.8 C)  TempSrc: Temporal  SpO2: 99%  Height: '5\' 7"'$  (1.702 m)  PainSc: 5   PainLoc: Leg   Body mass index is 26.94 kg/m.   General:  WDWN in NAD; vital signs documented above Gait: Not observed HENT: WNL, normocephalic Pulmonary: normal non-labored breathing , without wheezing Cardiac: regular HR, without  Murmur; with carotid bruit on the right Abdomen:  soft and non tender Skin: with rashes Vascular Exam/Pulses:  Right Left  Radial 2+ (normal) Brisk doppler signal  Femoral Difficult to palpate  Not palpated  DP trace AKA  Peroneal Brisk biphasic AKA  PT monophasic AKA   Extremities: without ischemic changes, without Gangrene , without cellulitis; without open wounds;  Musculoskeletal: no muscle wasting or atrophy  Neurologic: A&O X 3;  No focal weakness or paresthesias are detected Psychiatric:  The pt has Normal affect.   Non-Invasive Vascular Imaging:   ABI's/TBI's on 08/17/2021: Right:  0.46/0.25 -  Great toe pressure: 43 Left:  AKA    Previous ABI's/TBI's on 04/29/2021: Right:  0.44/absent - Great toe pressure: absent Left:  AKA    ASSESSMENT/PLAN:: 70 y.o. male here for follow up for  hx of left AKA on 02/19/2021 by Dr. Stanford Breed.  He has hx of right femoral endarterectomy for ischemic rest pain on 04/24/2020 by Dr. Carlis Abbott and hx of right EIA stent with 7 mm x 29 mm VBX in 2018 by Dr. Scot Dock.  -pt doing well - he has brisk right peroneal doppler signals.  His ABI is essentially the same as the last visit, but his TBI and toe pressure are increased.  He has some burning in the great toe but no rest pain.   -pt will f/u in 3 months with ABI.  He will call sooner if he has any issues before then. -he received his new prosthesis a few days ago.  I have written for referral to PT to work with his new prosthesis.      Leontine Locket, [facility]  [facility] [phone]  Clinic MD:  Carlis Abbott

## 2021-08-18 ENCOUNTER — Other Ambulatory Visit: Payer: Self-pay

## 2021-08-18 DIAGNOSIS — I779 Disorder of arteries and arterioles, unspecified: Secondary | ICD-10-CM

## 2021-08-22 DIAGNOSIS — R531 Weakness: Secondary | ICD-10-CM | POA: Diagnosis not present

## 2021-08-22 DIAGNOSIS — Z89612 Acquired absence of left leg above knee: Secondary | ICD-10-CM | POA: Diagnosis not present

## 2021-08-24 ENCOUNTER — Other Ambulatory Visit: Payer: Self-pay | Admitting: Student

## 2021-08-24 DIAGNOSIS — I10 Essential (primary) hypertension: Secondary | ICD-10-CM

## 2021-08-24 DIAGNOSIS — E7800 Pure hypercholesterolemia, unspecified: Secondary | ICD-10-CM

## 2021-08-24 DIAGNOSIS — K219 Gastro-esophageal reflux disease without esophagitis: Secondary | ICD-10-CM

## 2021-09-05 ENCOUNTER — Other Ambulatory Visit: Payer: Self-pay | Admitting: Student

## 2021-09-05 ENCOUNTER — Other Ambulatory Visit: Payer: Self-pay | Admitting: Student in an Organized Health Care Education/Training Program

## 2021-09-05 DIAGNOSIS — I10 Essential (primary) hypertension: Secondary | ICD-10-CM

## 2021-09-05 DIAGNOSIS — K219 Gastro-esophageal reflux disease without esophagitis: Secondary | ICD-10-CM

## 2021-09-05 DIAGNOSIS — E7800 Pure hypercholesterolemia, unspecified: Secondary | ICD-10-CM

## 2021-09-21 ENCOUNTER — Other Ambulatory Visit: Payer: Self-pay | Admitting: Student

## 2021-09-21 DIAGNOSIS — I10 Essential (primary) hypertension: Secondary | ICD-10-CM

## 2021-09-21 DIAGNOSIS — E7800 Pure hypercholesterolemia, unspecified: Secondary | ICD-10-CM

## 2021-09-22 DIAGNOSIS — Z89612 Acquired absence of left leg above knee: Secondary | ICD-10-CM | POA: Diagnosis not present

## 2021-09-22 DIAGNOSIS — R531 Weakness: Secondary | ICD-10-CM | POA: Diagnosis not present

## 2021-10-06 ENCOUNTER — Other Ambulatory Visit: Payer: Self-pay | Admitting: Student in an Organized Health Care Education/Training Program

## 2021-10-06 ENCOUNTER — Other Ambulatory Visit: Payer: Self-pay | Admitting: Student

## 2021-10-06 DIAGNOSIS — E7800 Pure hypercholesterolemia, unspecified: Secondary | ICD-10-CM

## 2021-10-06 DIAGNOSIS — K219 Gastro-esophageal reflux disease without esophagitis: Secondary | ICD-10-CM

## 2021-10-06 DIAGNOSIS — I10 Essential (primary) hypertension: Secondary | ICD-10-CM

## 2021-10-08 DIAGNOSIS — Z23 Encounter for immunization: Secondary | ICD-10-CM | POA: Diagnosis not present

## 2021-10-12 ENCOUNTER — Ambulatory Visit (INDEPENDENT_AMBULATORY_CARE_PROVIDER_SITE_OTHER): Payer: Medicare HMO | Admitting: Orthopaedic Surgery

## 2021-10-12 ENCOUNTER — Other Ambulatory Visit: Payer: Self-pay

## 2021-10-12 ENCOUNTER — Encounter: Payer: Self-pay | Admitting: Orthopaedic Surgery

## 2021-10-12 DIAGNOSIS — G8929 Other chronic pain: Secondary | ICD-10-CM

## 2021-10-12 DIAGNOSIS — M25512 Pain in left shoulder: Secondary | ICD-10-CM | POA: Diagnosis not present

## 2021-10-12 MED ORDER — TRAMADOL HCL 50 MG PO TABS: 50.0000 mg | ORAL_TABLET | Freq: Every day | ORAL | 0 refills | Status: DC | PRN

## 2021-10-12 NOTE — Progress Notes (Signed)
Office Visit Note   Patient: [name]           Date of Birth: 1951-04-23           MRN: [id number] Visit Date: 10/12/2021              Requested by: Brian Gilding, DO [address] [place],  [place] [address] PCP: Brian Gilding, DO   Assessment & Plan: Visit Diagnoses:  1. Chronic left shoulder pain     Plan: Impression is chronic left shoulder pain.  He has had 2 cortisone injections without significant relief.  At this point we will need to obtain MRI to rule out structural abnormalities before we try a third injection.  Tramadol prescribed.  Follow-up after the MRI.  Follow-Up Instructions: No follow-ups on file.   Orders:  Orders Placed This Encounter  Procedures   MR Shoulder Left w/o contrast   Meds ordered this encounter  Medications   traMADol (ULTRAM) 50 MG tablet    Sig: Take 1-2 tablets (50-100 mg total) by mouth daily as needed.    Dispense:  20 tablet    Refill:  0      Procedures: No procedures performed   Clinical Data: No additional findings.   Subjective: Chief Complaint  Patient presents with   Left Shoulder - Pain    HPI  [name] chronic left shoulder pain last seen at our office on 04/07/2021.  He has had continued limited range of motion of the shoulder.  He is right-hand dominant.  Over-the-counter Tylenol does not help with the pain.  [name] did do an injection back in April which gave temporary relief and overall he has had 2 injections in his shoulder now.  Review of Systems   Objective: Vital Signs: There were no vitals taken for this visit.  Physical Exam  Ortho Exam  Left shoulder exam shows moderate decreased range of motion with significant pain.  Strength is mildly limited secondary to pain.  Specialty Comments:  No specialty comments available.  Imaging: No results found.   PMFS History: Patient Active Problem List   Diagnosis Date Noted   Healthcare maintenance 08/17/2021    Loose stools 06/07/2021   Abnormal weight gain 03/30/2021   Anal fissure 03/30/2021   Fatty liver 03/30/2021   Flatulence, eructation and gas pain 03/30/2021   Hematochezia 03/30/2021   Imaging of gastrointestinal tract abnormal 03/30/2021   Personal history of colonic polyps 03/30/2021   Rectal pain 03/30/2021   Constipation 03/30/2021   Drug-induced constipation 03/30/2021   Dysphagia 03/30/2021   Colon cancer screening 03/30/2021   Right upper quadrant pain 03/30/2021   Gastroesophageal reflux disease 03/30/2021   Limb ischemia 02/19/2021   S/P above knee amputation, left ([place]) 02/19/2021   Symptomatic anemia 12/04/2020   PAOD (peripheral arterial occlusive disease) ([place]) 04/24/2020   Adhesive capsulitis 03/10/2020   Lung nodule < 6cm on CT 01/28/2019   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 Vertell [name] neurosurgery   Chronic lower back pain    Claudication of both lower extremities ([place]) 07/15/2015   Critical lower limb ischemia ([place]) 12/19/2019   Diabetes mellitus    takes Metformin daily   GERD (gastroesophageal reflux disease)    takes Protonix daily   History of blood transfusion    "related to low HgB" ((09/10/2015   Hyperlipidemia    takes Vytorin daily   Hypertension    takes Benazepril and Bystolic daily   PAD (peripheral artery disease) ([facility])    Pneumonia     Radiculopathy, cervical region 02/11/2017   Shortness of breath dyspnea    with exertion   Tobacco user    Toe fracture, right 05/09/2011   Wears glasses     Family History  Problem Relation Age of Onset   Cancer Mother        colon Cancer   Hyperlipidemia Mother    Diabetes Mother    Heart disease Father    Hypertension Father    Cancer Sister        Uterine   Diabetes Sister    Diabetes Brother     Past Surgical History:  Procedure Laterality Date   ABDOMINAL AORTOGRAM W/LOWER EXTREMITY N/A 12/11/2017   Procedure: ABDOMINAL AORTOGRAM W/LOWER EXTREMITY;  Surgeon: Brian Mould, MD;  Location: [facility] [facility];  Service: Cardiovascular;  Laterality: N/A;   ABDOMINAL AORTOGRAM W/LOWER EXTREMITY Bilateral 04/22/2020   Procedure: ABDOMINAL AORTOGRAM W/LOWER EXTREMITY;  Surgeon: Brian Heck, MD;  Location: [place];  Service: Cardiovascular;  Laterality: Bilateral;   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   AMPUTATION Left 02/19/2021   Procedure: LEFT ABOVE KNEE AMPUTATION;  Surgeon: Brian Robins, MD;  Location: [facility];  Service: Vascular;  Laterality: Left;   ANTERIOR CERVICAL DECOMP/DISCECTOMY FUSION  03/08/12   C6-7   ANTERIOR CERVICAL DECOMP/DISCECTOMY FUSION  03/08/2012   Procedure: ANTERIOR CERVICAL DECOMPRESSION/DISCECTOMY FUSION 1 LEVEL/HARDWARE REMOVAL;  Surgeon: Brian Levine, MD;  Location: [facility];  Service: Neurosurgery;  Laterality: N/A;  revison of C5-7 anterior cervical decompression with fusion with Cervical Five-Thoracic One anterior cervical decompression with fusion with interbody prothesis plating and bonegraft   [place]   BIOPSY  12/05/2020   Procedure: BIOPSY;  Surgeon: Brian Artist, MD;  Location: [facility];  Service: Endoscopy;;   BYPASS GRAFT FEMORAL-PERONEAL Left 11/11/2016   Procedure: REDO LEFT FEMORAL-PERONEAL BYPASS WITH PROPATEN 6MM X 80CM GRAFT;  Surgeon: Brian Mould, MD;  Location: [facility];   Service: Vascular;  Laterality: Left;   COLONOSCOPY W/ BIOPSIES AND POLYPECTOMY  08/17/2012   f/u 5 years, 4 polyps, no high grade dysplasia or malignancy, tubular adenoma, hyperplastic polyops   COLONOSCOPY WITH PROPOFOL N/A 12/05/2020   Procedure: COLONOSCOPY WITH PROPOFOL;  Surgeon: Brian Artist, MD;  Location: [facility];  Service: Endoscopy;  Laterality: N/A;   EMBOLECTOMY Left 12/19/2019   Procedure: LEFT FEMERAL- PERONEAL THROMBECTOMY;  Surgeon: Brian Heck, MD;  Location: [place];  Service: Vascular;  Laterality: Left;   ENDARTERECTOMY FEMORAL Right 04/24/2020   Procedure: RIGHT FEMORAL ENDARTERECTOMY;  Surgeon: Brian Heck, MD;  Location: [place];  Service: Vascular;  Laterality: Right;   ENTEROSCOPY N/A 12/11/2015   Procedure: ENTEROSCOPY;  Surgeon: Carol Ada, MD;  Location: [facility];  Service: Endoscopy;  Laterality: N/A;   ENTEROSCOPY N/A  03/29/2018   Procedure: ENTEROSCOPY;  Surgeon: Carol Ada, MD;  Location: [place];  Service: Endoscopy;  Laterality: N/A;   ESOPHAGOGASTRODUODENOSCOPY  08/17/2012   normal esophagus and GEJ, diffuse gastritis with erythema- no malignancy, reactive gastropathy  with focal intestinal metaplasia   ESOPHAGOGASTRODUODENOSCOPY (EGD) WITH PROPOFOL N/A 12/05/2020   Procedure: ESOPHAGOGASTRODUODENOSCOPY (EGD) WITH PROPOFOL;  Surgeon: Brian Artist, MD;  Location: [facility];  Service: Endoscopy;  Laterality: N/A;   FEMORAL-POPLITEAL BYPASS GRAFT Left 01/06/2016   Procedure: Left  COMMON FEMORAL-BELOW KNEE POPLITEAL ARTERY Bypass using non-reversed translocated saphenous vein graft from left leg;  Surgeon: Mal Misty, MD;  Location: [place];  Service: Vascular;  Laterality: Left;   GIVENS CAPSULE STUDY N/A 11/24/2015   Procedure: GIVENS CAPSULE STUDY;  Surgeon: Juanita Craver, MD;  Location: [place];  Service: Endoscopy;  Laterality: N/A;   HOT HEMOSTASIS N/A 03/29/2018   Procedure: HOT HEMOSTASIS (ARGON PLASMA  COAGULATION/BICAP);  Surgeon: Carol Ada, MD;  Location: [place];  Service: Endoscopy;  Laterality: N/A;   INGUINAL HERNIA REPAIR  1990's   right   INTRAOPERATIVE ARTERIOGRAM Left 01/06/2016   Procedure: INTRA OPERATIVE ARTERIOGRAM LEFT LOWER LEG;  Surgeon: Mal Misty, MD;  Location: [place];  Service: Vascular;  Laterality: Left;   INTRAOPERATIVE ARTERIOGRAM Left 11/11/2016   Procedure: INTRA OPERATIVE ARTERIOGRAM LEFT LOWER EXTRIMITY;  Surgeon: Brian Mould, MD;  Location: [facility];  Service: Vascular;  Laterality: Left;   IR ANGIOGRAM FOLLOW UP STUDY  12/11/2017   IR GENERIC HISTORICAL  10/24/2016   IR ANGIOGRAM FOLLOW UP STUDY   LOWER EXTREMITY ANGIOGRAM Bilateral 07/30/2015   Procedure: Lower Extremity Angiogram;  Surgeon: Conrad Esperance, MD;  Location: [place] [facility];  Service: Cardiovascular;  Laterality: Bilateral;   [place]   "lower"   PATCH ANGIOPLASTY Left 12/19/2019   Procedure: LEFT FEMORAL -PERONEAL PATCH ANGIOPLASTY WITH XENOSURE BIOLOGIC PATCH  ;  Surgeon: Brian Heck, MD;  Location: [place] [facility];  Service: Vascular;  Laterality: Left;   PATCH ANGIOPLASTY Right 04/24/2020   Procedure: Patch Angioplasty of Right Femoral Artery using Long Xenosure Biologic Patch 1cm x 14 cm;  Surgeon: Brian Heck, MD;  Location: [facility];  Service: Vascular;  Laterality: Right;   PERIPHERAL VASCULAR CATHETERIZATION N/A 07/30/2015   Procedure: Abdominal Aortogram;  Surgeon: Conrad [place], MD;  Location: [place] [facility];  Service: Cardiovascular;  Laterality: N/A;   PERIPHERAL VASCULAR CATHETERIZATION N/A 10/24/2016   Procedure: Abdominal Aortogram;  Surgeon: Brian Mould, MD;  Location: [place] [facility];  Service: Cardiovascular;  Laterality: N/A;   PERIPHERAL VASCULAR CATHETERIZATION N/A 10/24/2016   Procedure: Lower Extremity Angiography;  Surgeon: Brian Mould, MD;  Location: [place] [facility];  Service: Cardiovascular;  Laterality:  N/A;   PERIPHERAL VASCULAR INTERVENTION Right 12/11/2017   Procedure: PERIPHERAL VASCULAR INTERVENTION;  Surgeon: Brian Mould, MD;  Location: [place] [facility];  Service: Cardiovascular;  Laterality: Right;   VASCULAR SURGERY  ~ 2007   Stent SFA    VEIN HARVEST Left 01/06/2016   Procedure: LEFT GREATER [place];  Surgeon: Mal Misty, MD;  [facility];  Service: Vascular;  Laterality: Left;   Social History   Occupational History   Not on file  Tobacco Use   Smoking status: Some Days    Types: Cigars   Smokeless tobacco: Never  Vaping Use   Vaping Use: Never used  Substance and Sexual Activity  Alcohol use: Yes    Alcohol/week: 2.0 standard drinks    Types: 2 Cans of beer per week   Drug use: No   Sexual activity: Yes

## 2021-10-15 ENCOUNTER — Other Ambulatory Visit: Payer: Self-pay | Admitting: Student

## 2021-10-15 DIAGNOSIS — E7800 Pure hypercholesterolemia, unspecified: Secondary | ICD-10-CM

## 2021-10-19 ENCOUNTER — Other Ambulatory Visit: Payer: Self-pay | Admitting: Orthopaedic Surgery

## 2021-10-22 DIAGNOSIS — Z89612 Acquired absence of left leg above knee: Secondary | ICD-10-CM | POA: Diagnosis not present

## 2021-10-22 DIAGNOSIS — R531 Weakness: Secondary | ICD-10-CM | POA: Diagnosis not present

## 2021-10-27 ENCOUNTER — Other Ambulatory Visit: Payer: Self-pay

## 2021-10-27 ENCOUNTER — Telehealth: Payer: Self-pay

## 2021-10-27 DIAGNOSIS — G8929 Other chronic pain: Secondary | ICD-10-CM

## 2021-10-27 DIAGNOSIS — M25512 Pain in left shoulder: Secondary | ICD-10-CM

## 2021-10-27 NOTE — Telephone Encounter (Signed)
Called patient no answer LMOM. Since MRI was denied, we will send him to PT first. Order for PT made. They will call him to schedule.

## 2021-10-31 ENCOUNTER — Other Ambulatory Visit: Payer: Medicare HMO

## 2021-11-01 ENCOUNTER — Telehealth: Payer: Self-pay | Admitting: Student

## 2021-11-01 DIAGNOSIS — Z89612 Acquired absence of left leg above knee: Secondary | ICD-10-CM

## 2021-11-01 NOTE — Telephone Encounter (Signed)
DME order for wheel chair has been sent.

## 2021-11-01 NOTE — Telephone Encounter (Signed)
LM for [name] with care coordination asking her to call back with a return fax number.  It was requested that we send a DME order along with supporting documentation.  Will wait to hear back and will call back in a few days.  Stormi Vandevelde,CMA

## 2021-11-03 ENCOUNTER — Other Ambulatory Visit: Payer: Self-pay | Admitting: Student

## 2021-11-03 DIAGNOSIS — K219 Gastro-esophageal reflux disease without esophagitis: Secondary | ICD-10-CM

## 2021-11-03 NOTE — Telephone Encounter (Signed)
Received call back from [place] with [facility] and order was faxed to [phone]

## 2021-11-08 ENCOUNTER — Other Ambulatory Visit: Payer: Self-pay | Admitting: Student

## 2021-11-08 DIAGNOSIS — E7800 Pure hypercholesterolemia, unspecified: Secondary | ICD-10-CM

## 2021-11-09 ENCOUNTER — Ambulatory Visit: Payer: Medicare HMO | Admitting: Rehabilitation

## 2021-11-13 ENCOUNTER — Other Ambulatory Visit: Payer: Self-pay | Admitting: Student

## 2021-11-13 DIAGNOSIS — I10 Essential (primary) hypertension: Secondary | ICD-10-CM

## 2021-11-22 DIAGNOSIS — Z89612 Acquired absence of left leg above knee: Secondary | ICD-10-CM | POA: Diagnosis not present

## 2021-11-22 DIAGNOSIS — R531 Weakness: Secondary | ICD-10-CM | POA: Diagnosis not present

## 2021-11-23 ENCOUNTER — Ambulatory Visit: Payer: Medicare HMO

## 2021-11-23 ENCOUNTER — Encounter (HOSPITAL_COMMUNITY): Payer: Medicare HMO

## 2021-11-24 ENCOUNTER — Other Ambulatory Visit: Payer: Self-pay | Admitting: Physician Assistant

## 2021-11-24 ENCOUNTER — Other Ambulatory Visit: Payer: Self-pay | Admitting: Student

## 2021-11-24 ENCOUNTER — Other Ambulatory Visit: Payer: Self-pay | Admitting: Student in an Organized Health Care Education/Training Program

## 2021-11-24 DIAGNOSIS — K219 Gastro-esophageal reflux disease without esophagitis: Secondary | ICD-10-CM

## 2021-11-24 DIAGNOSIS — I10 Essential (primary) hypertension: Secondary | ICD-10-CM

## 2021-11-26 ENCOUNTER — Other Ambulatory Visit: Payer: Self-pay

## 2021-11-26 ENCOUNTER — Ambulatory Visit: Payer: Medicare HMO | Attending: Physician Assistant

## 2021-11-26 DIAGNOSIS — R2689 Other abnormalities of gait and mobility: Secondary | ICD-10-CM | POA: Diagnosis not present

## 2021-11-26 DIAGNOSIS — R2681 Unsteadiness on feet: Secondary | ICD-10-CM | POA: Insufficient documentation

## 2021-11-26 DIAGNOSIS — G8929 Other chronic pain: Secondary | ICD-10-CM | POA: Diagnosis not present

## 2021-11-26 DIAGNOSIS — M25512 Pain in left shoulder: Secondary | ICD-10-CM | POA: Diagnosis not present

## 2021-11-26 DIAGNOSIS — R293 Abnormal posture: Secondary | ICD-10-CM | POA: Insufficient documentation

## 2021-11-26 DIAGNOSIS — M6281 Muscle weakness (generalized): Secondary | ICD-10-CM | POA: Insufficient documentation

## 2021-11-27 ENCOUNTER — Telehealth: Payer: Self-pay

## 2021-11-27 NOTE — Telephone Encounter (Signed)
Dr. Erlinda Hong, [name] was evaluated by PT on 11/26/21.  The patient would benefit from OT evaluation of chronic left shoulder pain as PT is treating for left AKA.   If you agree, please place an order in [facility] workque in [facility] or fax the order to [phone]. Thank you, Cherly Anderson, PT, DPT, [place] [address] [place], [place]  [address] Phone:  [phone] Fax:  [phone]

## 2021-11-27 NOTE — Therapy (Signed)
[facility] [address] [place], [place], [address] Phone: [phone]   Fax:  [phone]  Physical Therapy Evaluation  Patient Details  Name: [name] MRN: [id number] Date of Birth: September 28, 1951 Referring Provider (PT): Risa Grill, [place]   Encounter Date: 11/26/2021   PT End of Session - 11/26/21 0934     Visit Number 1    Number of Visits 25    Date for PT Re-Evaluation 02/18/22    Authorization Type aetna medicare with medicaid secondary, 10th visit progress note    PT Start Time 0933    PT Stop Time 1019    PT Time Calculation (min) 46 min    Equipment Utilized During Treatment Gait belt    Activity Tolerance Patient tolerated treatment well    Behavior During Therapy [facility] for tasks assessed/performed             Past Medical History:  Diagnosis Date   Adhesive capsulitis 03/10/2020   Anemia    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 Vertell Limber neurosurgery   Chronic lower back pain    Claudication of both lower extremities ([place]) 07/15/2015   Critical lower limb ischemia ([place]) 12/19/2019   Diabetes mellitus    takes Metformin daily   GERD (gastroesophageal reflux disease)    takes Protonix daily   History of blood transfusion    "related to low HgB" ((09/10/2015   Hyperlipidemia    takes Vytorin daily   Hypertension    takes Benazepril and Bystolic daily   PAD (peripheral artery disease) ([place])    Pneumonia    Radiculopathy, cervical region 02/11/2017   Shortness of breath dyspnea    with exertion   Tobacco user    Toe fracture, right 05/09/2011   Wears glasses     Past Surgical History:  Procedure Laterality Date   ABDOMINAL AORTOGRAM W/LOWER EXTREMITY N/A 12/11/2017   Procedure: ABDOMINAL AORTOGRAM W/LOWER EXTREMITY;  Surgeon: Angelia Mould, MD;  Location: [facility];  Service: Cardiovascular;  Laterality: N/A;   ABDOMINAL AORTOGRAM W/LOWER EXTREMITY Bilateral 04/22/2020   Procedure: ABDOMINAL AORTOGRAM W/LOWER EXTREMITY;  Surgeon: Marty Heck, MD;  Location: [facility];  Service: Cardiovascular;  Laterality: Bilateral;   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   AMPUTATION Left 02/19/2021   Procedure: LEFT ABOVE KNEE AMPUTATION;  Surgeon: Cherre Robins, MD;  Location: [facility];  Service: Vascular;  Laterality: Left;   ANTERIOR CERVICAL DECOMP/DISCECTOMY FUSION  03/08/12   C6-7   ANTERIOR CERVICAL DECOMP/DISCECTOMY FUSION  03/08/2012   Procedure: ANTERIOR CERVICAL DECOMPRESSION/DISCECTOMY FUSION 1 LEVEL/HARDWARE REMOVAL;  Surgeon: Erline Levine, MD;  Location: [facility]URO [facility]S;  Service: Neurosurgery;  Laterality: N/A;  revison of C5-7 anterior cervical decompression with fusion with Cervical Five-Thoracic One anterior cervical decompression with fusion with interbody prothesis plating and bonegraft   [place]   BIOPSY  12/05/2020   Procedure: BIOPSY;  Surgeon: Ladene Artist, MD;  Location: [facility] ENDOSCOPY;  Service: Endoscopy;;   BYPASS GRAFT FEMORAL-PERONEAL Left 11/11/2016   Procedure: REDO LEFT FEMORAL-PERONEAL BYPASS WITH PROPATEN 6MM X 80CM GRAFT;  Surgeon: Angelia Mould, MD;  Location: [facility];  Service: Vascular;  Laterality: Left;   COLONOSCOPY W/ BIOPSIES AND POLYPECTOMY  08/17/2012   f/u 5 years, 4 polyps, no high grade dysplasia or malignancy, tubular adenoma,  hyperplastic polyops   COLONOSCOPY WITH PROPOFOL N/A 12/05/2020   Procedure: COLONOSCOPY WITH PROPOFOL;  Surgeon: Ladene Artist, MD;  Location: [facility];  Service: Endoscopy;  Laterality: N/A;   EMBOLECTOMY Left 12/19/2019   Procedure: LEFT FEMERAL- PERONEAL THROMBECTOMY;  Surgeon: Marty Heck, MD;  Location: [facility];  Service: Vascular;  Laterality: Left;   ENDARTERECTOMY FEMORAL Right 04/24/2020   Procedure: RIGHT FEMORAL ENDARTERECTOMY;   Surgeon: Marty Heck, MD;  Location: [place];  Service: Vascular;  Laterality: Right;   ENTEROSCOPY N/A 12/11/2015   Procedure: ENTEROSCOPY;  Surgeon: Carol Ada, MD;  Location: [facility];  Service: Endoscopy;  Laterality: N/A;   ENTEROSCOPY N/A 03/29/2018   Procedure: ENTEROSCOPY;  Surgeon: Carol Ada, MD;  Location: [facility];  Service: Endoscopy;  Laterality: N/A;   ESOPHAGOGASTRODUODENOSCOPY  08/17/2012   normal esophagus and GEJ, diffuse gastritis with erythema- no malignancy, reactive gastropathy  with focal intestinal metaplasia   ESOPHAGOGASTRODUODENOSCOPY (EGD) WITH PROPOFOL N/A 12/05/2020   Procedure: ESOPHAGOGASTRODUODENOSCOPY (EGD) WITH PROPOFOL;  Surgeon: Ladene Artist, MD;  Location: [facility] ENDOSCOPY;  Service: Endoscopy;  Laterality: N/A;   FEMORAL-POPLITEAL BYPASS GRAFT Left 01/06/2016   Procedure: Left  COMMON FEMORAL-BELOW KNEE POPLITEAL ARTERY Bypass using non-reversed translocated saphenous vein graft from left leg;  Surgeon: Mal Misty, MD;  Location: [facility];  Service: Vascular;  Laterality: Left;   GIVENS CAPSULE STUDY N/A 11/24/2015   Procedure: GIVENS CAPSULE STUDY;  Surgeon: Juanita Craver, MD;  Location: [facility];  Service: Endoscopy;  Laterality: N/A;   HOT HEMOSTASIS N/A 03/29/2018   Procedure: HOT HEMOSTASIS (ARGON PLASMA COAGULATION/BICAP);  Surgeon: Carol Ada, MD;  Location: [facility];  Service: Endoscopy;  Laterality: N/A;   INGUINAL HERNIA REPAIR  1990's   right   INTRAOPERATIVE ARTERIOGRAM Left 01/06/2016   Procedure: INTRA OPERATIVE ARTERIOGRAM LEFT LOWER LEG;  Surgeon: Mal Misty, MD;  Location: [facility];  Service: Vascular;  Laterality: Left;   INTRAOPERATIVE ARTERIOGRAM Left 11/11/2016   Procedure: INTRA OPERATIVE ARTERIOGRAM LEFT LOWER EXTRIMITY;  Surgeon: Angelia Mould, MD;  Location: [facility];  Service: Vascular;  Laterality: Left;   IR ANGIOGRAM FOLLOW UP STUDY  12/11/2017   IR GENERIC HISTORICAL  10/24/2016   IR ANGIOGRAM  FOLLOW UP STUDY   LOWER EXTREMITY ANGIOGRAM Bilateral 07/30/2015   Procedure: Lower Extremity Angiogram;  Surgeon: Conrad Gallant, MD;  Location: [facility];  Service: Cardiovascular;  Laterality: Bilateral;   Ray   "lower"   PATCH ANGIOPLASTY Left 12/19/2019   Procedure: LEFT FEMORAL -PERONEAL PATCH ANGIOPLASTY WITH XENOSURE BIOLOGIC PATCH  ;  Surgeon: Marty Heck, MD;  Location: [facility];  Service: Vascular;  Laterality: Left;   PATCH ANGIOPLASTY Right 04/24/2020   Procedure: Patch Angioplasty of Right Femoral Artery using Long Xenosure Biologic Patch 1cm x 14 cm;  Surgeon: Marty Heck, MD;  Location: [facility];  Service: Vascular;  Laterality: Right;   PERIPHERAL VASCULAR CATHETERIZATION N/A 07/30/2015   Procedure: Abdominal Aortogram;  Surgeon: Conrad Masury, MD;  Location: [facility];  Service: Cardiovascular;  Laterality: N/A;   PERIPHERAL VASCULAR CATHETERIZATION N/A 10/24/2016   Procedure: Abdominal Aortogram;  Surgeon: Angelia Mould, MD;  Location: [facility];  Service: Cardiovascular;  Laterality: N/A;   PERIPHERAL VASCULAR CATHETERIZATION N/A 10/24/2016   Procedure: Lower Extremity Angiography;  Surgeon: Angelia Mould, MD;  Location: [facility];  Service: Cardiovascular;  Laterality: N/A;   PERIPHERAL VASCULAR INTERVENTION  Right 12/11/2017   Procedure: PERIPHERAL VASCULAR INTERVENTION;  Surgeon: Angelia Mould, MD;  Location: [facility];  Service: Cardiovascular;  Laterality: Right;   VASCULAR SURGERY  ~ 2007   Stent SFA    VEIN HARVEST Left 01/06/2016   Procedure: LEFT GREATER SAPPHENOUS VEIN HARVEST;  Surgeon: Mal Misty, MD;  Location: [facility];  Service: Vascular;  Laterality: Left;    There were no vitals filed for this visit.    Subjective Assessment - 11/26/21 0934     Subjective Pt was referred for L AKA from 02/19/21. Received prosthesis in August from [place] on [place] from  [employer], [place]. Pt reports wearing prosthesis about 1-2 hours/day but wearing liner about 3 hours. He has been standing at sink trying to work with it but has been getting discouraged. Pt reports he is getting pain in lateral residual limb and does get phantom pains as well. Pt uses walker in home but not with prosthesis. Pt has manual w/c at at home that he uses for most mobiliy.    Patient Stated Goals Pt wants to get on his leg to be able to walk wihtout walker.    Currently in Pain? Yes    Pain Score 7     Pain Location Leg    Pain Orientation Left    Pain Descriptors / Indicators Sharp    Pain Type Chronic pain    Pain Onset More than a month ago    Pain Frequency Constant    Multiple Pain Sites Yes    Pain Location Shoulder    Pain Orientation Left    Pain Type Chronic pain    Pain Radiating Towards started about 5 months ago- reports frozen shoulder    Pain Onset More than a month ago    Pain Frequency Constant    Aggravating Factors  raising arm, walking on walker    Pain Relieving Factors none                [facility] PT Assessment - 11/26/21 0944       Assessment   Medical Diagnosis L AKA    Referring Provider (PT) Risa Grill, PA    Onset Date/Surgical Date 07/26/22   received prosthesis in August   Hand Dominance Right    Prior Therapy none      Precautions   Precautions Fall      Balance Screen   Has the patient fallen in the past 6 months Yes    How many times? 2    Has the patient had a decrease in activity level because of a fear of falling?  Yes    Is the patient reluctant to leave their home because of a fear of falling?  Yes      [place] residence    Living Arrangements Spouse/significant other   fiance   Type of [place] One level    [place] - 2 wheels;Wheelchair - manual;Tub bench;Bedside commode      Prior Function   Level of Independence  Independent    Vocation Retired    Leisure doing things outdoors, gardening      Cognition   Overall Cognitive Status Within Functional Limits for tasks assessed      Sensation   Light Touch Appears Intact      ROM / Strength   AROM / PROM /  Strength Strength;AROM      AROM   Overall AROM Comments In standing appears to lack full left hip extension getting to almost neutral      Strength   Overall Strength Comments --    Strength Assessment Site Shoulder;Hip;Knee;Ankle;Elbow;Hand    Right/Left Shoulder Right;Left    Right Shoulder Flexion 4+/5    Left Shoulder Flexion 2-/5   pain with flexion. Only able to raise about 80 degrees   Right/Left Elbow Right;Left    Right Elbow Flexion 5/5    Right Elbow Extension 5/5    Left Elbow Flexion 4+/5    Left Elbow Extension 4+/5    Right/Left hand Right;Left    Right Hand Gross Grasp Functional    Left Hand Gross Grasp Functional   decreased compared to right   Right/Left Hip Right;Left    Right Hip Flexion 5/5    Right Hip ABduction 5/5    Right Hip ADduction 5/5    Left Hip Flexion 4/5   pain   Left Hip ABduction 4/5    Left Hip ADduction 4/5    Right/Left Knee Right    Right Knee Flexion 5/5    Right Knee Extension 5/5    Right/Left Ankle Right    Right Ankle Dorsiflexion 5/5      Transfers   Transfers Sit to Stand;Stand to Sit    Sit to Stand 4: Min guard;With armrests;With upper extremity assist    Stand to Sit 4: Min guard;With armrests;With upper extremity assist      Ambulation/Gait   Ambulation/Gait Yes    Ambulation/Gait Assistance 4: Min assist    Ambulation/Gait Assistance Details Pt was cued to keep chest upright and pull back with left gluts to lock out knee. Cued to try to step past left foot some with right as well and pushing walker out a little further. Noted prosthesis unlocking very easily prior to getting weight on forefoot.    Ambulation Distance (Feet) 40 Feet    Assistive device Rolling  walker;Prosthesis    Gait Pattern Step-to pattern;Step-through pattern;Decreased stance time - left;Decreased weight shift to left;Trunk flexed    Ambulation Surface Level;Indoor    Gait velocity 39.51 over 7'= 0.82m/s      Standardized Balance Assessment   Standardized Balance Assessment Berg Balance Test      Berg Balance Test   Sit to Stand Able to stand  independently using hands    Standing Unsupported Able to stand 2 minutes with supervision    Sitting with Back Unsupported but Feet Supported on Floor or Stool Able to sit 2 minutes under supervision    Stand to Sit Controls descent by using hands    Transfers Needs one person to assist    Standing Unsupported with Eyes Closed Unable to keep eyes closed 3 seconds but stays steady    Standing Unsupported with Feet Together Able to place feet together independently and stand for 1 minute with supervision    From Standing Position, Turn to Look Behind Over each Shoulder Needs supervision when turning    Turn 360 Degrees Needs assistance while turning             Prosthetics Assessment - 11/26/21 [place] with Proper wear schedule/adjustment;Correct ply sock adjustment;Residual limb care    Prosthetic Care Comments  Pt was educated on skin check and discussed sweat management with possible antiperspirant if issue. Pt denies any issues. Does  not shave leg at all. Reviewed washing liner at night with water and mild soap and letting dry and switching liners in AM. Pt to wear shrinker whenever not in liner. Liner wear time does count as prosthetic time and want to gradually increase time. Reviewed how to donn with pulling up on velcro after weight shifting side to side some in standing and not pulling down on anchor. Pt was also advised to bring socks with him so can adjust if needed.    Donning prosthesis  Min assist    Doffing prosthesis  Supervision    Current prosthetic wear tolerance  (days/week)  1-2    Current prosthetic wear tolerance (#hours/day)  daily    Edema none noted    Residual limb condition  intact    Prosthesis Description Pt has velcro suspension with a dual axis knee. PT called Mortimer Fries, the prosthetist, and he did say there is a  manual lock at the knee center in front that slides over but PT did not note at eval. Mortimer Fries was going to get pt in to look at tightening up knee some as unlocking too easily.                       Objective measurements completed on examination: See above findings.                PT Education - 11/26/21 1047     Education Details PT plan of care. PT will call the orthopedic doctor to get left shoulder referral changed to OT    Person(s) Educated Patient    Methods Explanation    Comprehension Verbalized understanding              PT Short Term Goals - 11/27/21 1405       PT SHORT TERM GOAL #1   Title Pt will be able to tolerate wearing prosthesis 8 hours/day for improved function.    Baseline currently 1-3 hours/day    Time 4    Status New    Target Date 12/25/21      PT SHORT TERM GOAL #2   Title Pt will be independent with prosthetic management.    Time 4    Period Weeks    Status New    Target Date 12/25/21      PT SHORT TERM GOAL #3   Title Berg TBD    Time 4    Period Weeks    Status New    Target Date 12/25/21      PT SHORT TERM GOAL #4   Title Pt will ambulate 200' with RW supervision for improved household ambulation.    Time 4    Period Weeks    Status New    Target Date 12/25/21               PT Long Term Goals - 11/27/21 1413       PT LONG TERM GOAL #1   Title Pt will be independent with progressive HEP to continue strength and balance gains on own. (LTGs due 02/18/22)    Time 12    Period Weeks    Status New    Target Date 02/18/22      PT LONG TERM GOAL #2   Title Pt will increase gait speed to >0.61m/s for improved household mobility.    Baseline  11/26/21 0.62m/s    Time 12    Period Weeks    Status New  Target Date 02/18/22      PT LONG TERM GOAL #3   Title Berg TBD    Time 12    Period Weeks    Status New    Target Date 02/18/22      PT LONG TERM GOAL #4   Title Pt will ambulate >400' on varied level surfaces mod I with RW for improved short community distances.    Time 12    Period Weeks    Status New    Target Date 02/18/22      PT LONG TERM GOAL #5   Title Pt will ambulate up/down 4 steps with rails mod I for improved community access.    Time 12    Period Weeks    Status New    Target Date 02/18/22      Additional Long Term Goals   Additional Long Term Goals Yes      PT LONG TERM GOAL #6   Title Pt will ambulate up/down ramp and curb with RW mod I for improved community access.    Time 12    Period Weeks    Status New    Target Date 02/18/22                    Plan - 11/26/21 1049     Clinical Impression Statement Pt is 70 y/o male who presents with L AKA 02/19/21  for which he received his prosthesis 8/22. Pt is having some pain in left lateral residual limb as well as phantom pain. He has slight decreased strength in left hip grossly 4/5 with decreased left hip extension not able to perform past neutral. He is currently wearing prosthesis 1-2 hours/day and had not been walking yet. At visit was able to walk 40' min assist but was having some issues with prosthetic knee unlocking too early. Gait speed of 0.80m/s indicating he is not yet household ambulator. Pt is fall risk as PT started [employer] with pt having difficulty with any dynamic movements or more narrow BOS. Will complete at next visit.  Pt will benefit from skilled PT for prosthetic training.    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 Stanford Breed. PMH includes extensive vascular surgery history (L femoral to peroneal bypass 12/2015; R EIA stent 2018; thrombectomy of L femoral  to peroneal bypass and L peroneal artery 12/20; R femoral endarterectomy, profundoplasty with bovine pericardial patch on 03/2020), OA, DM, GERD, HLD, HTN, PAD, ACDF 2013. Chronic left shoulder pain    Examination-Activity Limitations Locomotion Level;Transfers;Stairs;Stand;Squat    Examination-Participation Restrictions Cleaning;Community Activity;Yard Work;Meal Prep    Stability/Clinical Decision Making Evolving/Moderate complexity    Clinical Decision Making Moderate    Rehab Potential Good    PT Frequency 2x / week   plus eval   PT Duration 12 weeks    PT Treatment/Interventions ADLs/Self Care Home Management;DME Instruction;Cryotherapy;Moist Heat;Gait training;Stair training;Functional mobility training;Therapeutic activities;Therapeutic exercise;Balance training;Neuromuscular re-education;Manual techniques;Prosthetic Training;Passive range of motion;Vestibular;Patient/family education    PT Next Visit Plan Complete Berg Balance that was started and update short and LTGs, did Bobby make changes to locking at knee. Check out lock on front of knee. Initial HEP at counter, gait training with RW. Prosthetic education. Discuss increasing wear time to 2 hours 2x/day. Did OT referral for shoulder come back?             Patient will benefit from skilled therapeutic intervention in  order to improve the following deficits and impairments:  Abnormal gait, Decreased mobility, Decreased strength, Postural dysfunction, Prosthetic Dependency, Decreased range of motion, Decreased activity tolerance, Decreased balance, Decreased knowledge of use of DME, Decreased endurance  Visit Diagnosis: Other abnormalities of gait and mobility  Muscle weakness (generalized)  Abnormal posture  Unsteadiness on feet     Problem List Patient Active Problem List   Diagnosis Date Noted   Healthcare maintenance 08/17/2021   Loose stools 06/07/2021   Abnormal weight gain 03/30/2021   Anal fissure 03/30/2021    Fatty liver 03/30/2021   Flatulence, eructation and gas pain 03/30/2021   Hematochezia 03/30/2021   Imaging of gastrointestinal tract abnormal 03/30/2021   Personal history of colonic polyps 03/30/2021   Rectal pain 03/30/2021   Constipation 03/30/2021   Drug-induced constipation 03/30/2021   Dysphagia 03/30/2021   Colon cancer screening 03/30/2021   Right upper quadrant pain 03/30/2021   Gastroesophageal reflux disease 03/30/2021   Limb ischemia 02/19/2021   S/P above knee amputation, left ([place]) 02/19/2021   Symptomatic anemia 12/04/2020   PAOD (peripheral arterial occlusive disease) ([place]) 04/24/2020   Adhesive capsulitis 03/10/2020   Lung nodule < 6cm on CT 01/28/2019   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 [place], [place], [address] Phone: [phone]   Fax:  [phone]  Name: [name] MRN: [id number] Date of Birth: 1951/01/23

## 2021-11-28 NOTE — Telephone Encounter (Signed)
Please place order thanks.

## 2021-11-29 ENCOUNTER — Other Ambulatory Visit: Payer: Self-pay

## 2021-11-29 DIAGNOSIS — G8929 Other chronic pain: Secondary | ICD-10-CM

## 2021-11-29 DIAGNOSIS — M25512 Pain in left shoulder: Secondary | ICD-10-CM

## 2021-11-29 NOTE — Telephone Encounter (Signed)
Referral made in Epic.

## 2021-11-29 NOTE — Telephone Encounter (Signed)
FAXED

## 2021-12-01 ENCOUNTER — Ambulatory Visit: Payer: Medicare HMO

## 2021-12-02 ENCOUNTER — Ambulatory Visit: Payer: Medicare HMO | Admitting: Occupational Therapy

## 2021-12-02 ENCOUNTER — Other Ambulatory Visit: Payer: Self-pay

## 2021-12-02 ENCOUNTER — Ambulatory Visit: Payer: Medicare HMO

## 2021-12-02 DIAGNOSIS — R2681 Unsteadiness on feet: Secondary | ICD-10-CM

## 2021-12-02 DIAGNOSIS — G8929 Other chronic pain: Secondary | ICD-10-CM

## 2021-12-02 DIAGNOSIS — M6281 Muscle weakness (generalized): Secondary | ICD-10-CM

## 2021-12-02 DIAGNOSIS — M25512 Pain in left shoulder: Secondary | ICD-10-CM

## 2021-12-02 DIAGNOSIS — R2689 Other abnormalities of gait and mobility: Secondary | ICD-10-CM

## 2021-12-02 DIAGNOSIS — R293 Abnormal posture: Secondary | ICD-10-CM | POA: Diagnosis not present

## 2021-12-02 NOTE — Therapy (Signed)
[facility] [address] [place], [place], [address] Phone: [phone]   Fax:  [phone]  Physical Therapy Treatment  Patient Details  Name: [name] MRN: [id number] Date of Birth: Jun 18, 1951 Referring Provider (PT): Risa Grill, [place]   Encounter Date: 12/02/2021   PT End of Session - 12/02/21 1026     Visit Number 2    Number of Visits 25    Date for PT Re-Evaluation 02/18/22    Authorization Type aetna medicare with medicaid secondary, 10th visit progress note    PT Start Time 1023   PT running behind   PT Stop Time 1102    PT Time Calculation (min) 39 min    Equipment Utilized During Treatment Gait belt    Activity Tolerance Patient tolerated treatment well    Behavior During Therapy [facility] for tasks assessed/performed             Past Medical History:  Diagnosis Date   Adhesive capsulitis 03/10/2020   Anemia    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 Vertell Limber neurosurgery   Chronic lower back pain    Claudication of both lower extremities ([place]) 07/15/2015   Critical lower limb ischemia ([place]) 12/19/2019   Diabetes mellitus    takes Metformin daily   GERD (gastroesophageal reflux disease)    takes Protonix daily   History of blood transfusion    "related to low HgB" ((09/10/2015   Hyperlipidemia    takes Vytorin daily   Hypertension    takes Benazepril and Bystolic daily   PAD (peripheral artery disease) ([place])    Pneumonia    Radiculopathy, cervical region 02/11/2017   Shortness of breath dyspnea    with exertion   Tobacco user    Toe fracture, right 05/09/2011   Wears glasses     Past Surgical History:  Procedure Laterality Date   ABDOMINAL AORTOGRAM W/LOWER EXTREMITY N/A 12/11/2017   Procedure: ABDOMINAL AORTOGRAM W/LOWER EXTREMITY;  Surgeon: Angelia Mould, MD;   Location: [facility];  Service: Cardiovascular;  Laterality: N/A;   ABDOMINAL AORTOGRAM W/LOWER EXTREMITY Bilateral 04/22/2020   Procedure: ABDOMINAL AORTOGRAM W/LOWER EXTREMITY;  Surgeon: Marty Heck, MD;  Location: [facility];  Service: Cardiovascular;  Laterality: Bilateral;   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   AMPUTATION Left 02/19/2021   Procedure: LEFT ABOVE KNEE AMPUTATION;  Surgeon: Cherre Robins, MD;  Location: [facility];  Service: Vascular;  Laterality: Left;   ANTERIOR CERVICAL DECOMP/DISCECTOMY FUSION  03/08/12   C6-7   ANTERIOR CERVICAL DECOMP/DISCECTOMY FUSION  03/08/2012   Procedure: ANTERIOR CERVICAL DECOMPRESSION/DISCECTOMY FUSION 1 LEVEL/HARDWARE REMOVAL;  Surgeon: Erline Levine, MD;  Location: [facility];  Service: Neurosurgery;  Laterality: N/A;  revison of C5-7 anterior cervical decompression with fusion with Cervical Five-Thoracic One anterior cervical decompression with fusion with interbody prothesis plating and bonegraft   [place]   BIOPSY  12/05/2020   Procedure: BIOPSY;  Surgeon: Ladene Artist, MD;  Location: [facility] ENDOSCOPY;  Service: Endoscopy;;   BYPASS GRAFT FEMORAL-PERONEAL Left 11/11/2016   Procedure: REDO LEFT FEMORAL-PERONEAL BYPASS WITH PROPATEN 6MM X 80CM GRAFT;  Surgeon: Angelia Mould, MD;  Location: [facility];  Service: Vascular;  Laterality: Left;   COLONOSCOPY W/ BIOPSIES AND POLYPECTOMY  08/17/2012   f/u 5 years, 4 polyps, no high grade dysplasia  or malignancy, tubular adenoma, hyperplastic polyops   COLONOSCOPY WITH PROPOFOL N/A 12/05/2020   Procedure: COLONOSCOPY WITH PROPOFOL;  Surgeon: Ladene Artist, MD;  Location: [facility] ENDOSCOPY;  Service: Endoscopy;  Laterality: N/A;   EMBOLECTOMY Left 12/19/2019   Procedure: LEFT FEMERAL- PERONEAL THROMBECTOMY;  Surgeon: Marty Heck, MD;  Location: [facility];  Service: Vascular;  Laterality: Left;   ENDARTERECTOMY FEMORAL Right 04/24/2020   Procedure: RIGHT FEMORAL  ENDARTERECTOMY;  Surgeon: Marty Heck, MD;  Location: [facility];  Service: Vascular;  Laterality: Right;   ENTEROSCOPY N/A 12/11/2015   Procedure: ENTEROSCOPY;  Surgeon: Carol Ada, MD;  Location: [facility];  Service: Endoscopy;  Laterality: N/A;   ENTEROSCOPY N/A 03/29/2018   Procedure: ENTEROSCOPY;  Surgeon: Carol Ada, MD;  Location: [facility];  Service: Endoscopy;  Laterality: N/A;   ESOPHAGOGASTRODUODENOSCOPY  08/17/2012   normal esophagus and GEJ, diffuse gastritis with erythema- no malignancy, reactive gastropathy  with focal intestinal metaplasia   ESOPHAGOGASTRODUODENOSCOPY (EGD) WITH PROPOFOL N/A 12/05/2020   Procedure: ESOPHAGOGASTRODUODENOSCOPY (EGD) WITH PROPOFOL;  Surgeon: Ladene Artist, MD;  Location: [facility] ENDOSCOPY;  Service: Endoscopy;  Laterality: N/A;   FEMORAL-POPLITEAL BYPASS GRAFT Left 01/06/2016   Procedure: Left  COMMON FEMORAL-BELOW KNEE POPLITEAL ARTERY Bypass using non-reversed translocated saphenous vein graft from left leg;  Surgeon: Mal Misty, MD;  Location: [facility];  Service: Vascular;  Laterality: Left;   GIVENS CAPSULE STUDY N/A 11/24/2015   Procedure: GIVENS CAPSULE STUDY;  Surgeon: Juanita Craver, MD;  Location: [facility];  Service: Endoscopy;  Laterality: N/A;   HOT HEMOSTASIS N/A 03/29/2018   Procedure: HOT HEMOSTASIS (ARGON PLASMA COAGULATION/BICAP);  Surgeon: Carol Ada, MD;  Location: [facility];  Service: Endoscopy;  Laterality: N/A;   INGUINAL HERNIA REPAIR  1990's   right   INTRAOPERATIVE ARTERIOGRAM Left 01/06/2016   Procedure: INTRA OPERATIVE ARTERIOGRAM LEFT LOWER LEG;  Surgeon: Mal Misty, MD;  Location: [facility];  Service: Vascular;  Laterality: Left;   INTRAOPERATIVE ARTERIOGRAM Left 11/11/2016   Procedure: INTRA OPERATIVE ARTERIOGRAM LEFT LOWER EXTRIMITY;  Surgeon: Angelia Mould, MD;  Location: [facility];  Service: Vascular;  Laterality: Left;   IR ANGIOGRAM FOLLOW UP STUDY  12/11/2017   IR GENERIC HISTORICAL  10/24/2016    IR ANGIOGRAM FOLLOW UP STUDY   LOWER EXTREMITY ANGIOGRAM Bilateral 07/30/2015   Procedure: Lower Extremity Angiogram;  Surgeon: Conrad Fayette, MD;  Location: [facility];  Service: Cardiovascular;  Laterality: Bilateral;   [place]   "lower"   PATCH ANGIOPLASTY Left 12/19/2019   Procedure: LEFT FEMORAL -PERONEAL PATCH ANGIOPLASTY WITH XENOSURE BIOLOGIC PATCH  ;  Surgeon: Marty Heck, MD;  Location: [facility];  Service: Vascular;  Laterality: Left;   PATCH ANGIOPLASTY Right 04/24/2020   Procedure: Patch Angioplasty of Right Femoral Artery using Long Xenosure Biologic Patch 1cm x 14 cm;  Surgeon: Marty Heck, MD;  Location: [facility] [place];  Service: Vascular;  Laterality: Right;   PERIPHERAL VASCULAR CATHETERIZATION N/A 07/30/2015   Procedure: Abdominal Aortogram;  Surgeon: Conrad Hyannis, MD;  Location: [place];  Service: Cardiovascular;  Laterality: N/A;   PERIPHERAL VASCULAR CATHETERIZATION N/A 10/24/2016   Procedure: Abdominal Aortogram;  Surgeon: Angelia Mould, MD;  Location: [facility];  Service: Cardiovascular;  Laterality: N/A;   PERIPHERAL VASCULAR CATHETERIZATION N/A 10/24/2016   Procedure: Lower Extremity Angiography;  Surgeon: Angelia Mould, MD;  Location: [facility];  Service: Cardiovascular;  Laterality: N/A;  PERIPHERAL VASCULAR INTERVENTION Right 12/11/2017   Procedure: PERIPHERAL VASCULAR INTERVENTION;  Surgeon: Angelia Mould, MD;  Location: [place] [facility];  Service: Cardiovascular;  Laterality: Right;   VASCULAR SURGERY  ~ 2007   Stent SFA    VEIN HARVEST Left 01/06/2016   Procedure: LEFT GREATER SAPPHENOUS VEIN HARVEST;  Surgeon: Mal Misty, MD;  Location: [facility];  Service: Vascular;  Laterality: Left;    There were no vitals filed for this visit.   Subjective Assessment - 12/02/21 1026     Subjective Pt saw Bobby yesterday and he adjusted leg to make easier to lock with weight on ischium.  He also sent PT a note that knee does not have a manual lock like he had said.    Patient Stated Goals Pt wants to get on his leg to be able to walk wihtout walker.    Currently in Pain? Yes    Pain Score 7     Pain Location Shoulder    Pain Orientation Left    Pain Descriptors / Indicators Aching;Burning    Pain Type Chronic pain    Pain Onset More than a month ago    Pain Frequency Constant    Pain Onset More than a month ago                [facility] PT Assessment - 12/02/21 1029       Standardized Balance Assessment   Standardized Balance Assessment Berg Balance Test      Berg Balance Test   Sit to Stand Able to stand  independently using hands    Standing Unsupported Able to stand 2 minutes with supervision    Sitting with Back Unsupported but Feet Supported on Floor or Stool Able to sit 2 minutes under supervision    Stand to Sit Controls descent by using hands    Transfers Needs one person to assist    Standing Unsupported with Eyes Closed Unable to keep eyes closed 3 seconds but stays steady    Standing Unsupported with Feet Together Able to place feet together independently and stand for 1 minute with supervision    From Standing, Reach Forward with Outstretched Arm Can reach forward >12 cm safely (5")    From Standing Position, Pick up Object from Floor Unable to try/needs assist to keep balance    From Standing Position, Turn to Look Behind Over each Shoulder Needs supervision when turning    Turn 360 Degrees Needs assistance while turning    Standing Unsupported, Alternately Place Feet on Step/Stool Needs assistance to keep from falling or unable to try    Standing Unsupported, One Foot in [employer] balance while stepping or standing   able to take small step with LLE but not with right   Standing on One Leg Unable to try or needs assist to prevent fall   3 sec right, unable left   Total Score 21    Berg comment: high fall risk                            [facility] Adult PT Treatment/Exercise - 12/02/21 1029       Ambulation/Gait   Ambulation/Gait Yes    Ambulation/Gait Assistance 4: Min guard;4: Min assist    Ambulation/Gait Assistance Details Verbal cues to lock left knee with engaging gluts for more hip extension staying up tall and shift over leg stepping past with RLE. Instructed to not  take too large a step with prosthesis and try to increase right step past it some. At times would not push walker out enough to allow this. W/c folow for safety. Pt had to stop due to fatigue and right low back bothering him.    Ambulation Distance (Feet) 50 Feet    Assistive device Rolling walker;Prosthesis    Gait Pattern Step-to pattern;Step-through pattern;Decreased stance time - left;Decreased weight shift to left    Ambulation Surface Level;Indoor    Gait Comments At counter: working on weight shifting to left x 10 with tactile cues and verbal cues to squeeze gluts to stay up tall.  Then stepping forward and back with right leg x 10 with cues to weight shift first prior to stepping and stay up tall. Added these to HEP at home to perform at counter.      Standardized Balance Assessment   Standardized Balance Assessment Berg Balance Test      Prosthetics   Prosthetic Care Comments  Pt reports wearing liner for 4 hours but socket only about 2. PT discussed working on wear time with doing 2 hours, 2x/day and if skin doing well to increase on Saturday to 3 hours/2x/day. Liner time counts towards prosthetic time but want him to try to wear prosthesis as well. To be sure prosthetic foot is supported and not hanging. With donning prosthesis PT reminded to do some weight shift in standing and then tighten up strap pulling up from bottom.    Current prosthetic wear tolerance (days/week)  4    Current prosthetic wear tolerance (#hours/day)  daily    Residual limb condition  Pt denies any issues    Education Provided Skin check;Proper  wear schedule/adjustment;Proper Donning;Proper Doffing    Person(s) Educated Patient    Education Method Explanation;Demonstration    Education Method Verbalized understanding    Donning Prosthesis Minimal assist                     PT Education - 12/02/21 1414     Education Details Started on initial counter HEP    Person(s) Educated Patient    Methods Explanation;Demonstration;Handout    Comprehension Verbalized understanding;Returned demonstration              PT Short Term Goals - 12/02/21 1415       PT SHORT TERM GOAL #1   Title Pt will be able to tolerate wearing prosthesis 8 hours/day for improved function.    Baseline currently 1-3 hours/day    Time 4    Status New    Target Date 12/25/21      PT SHORT TERM GOAL #2   Title Pt will be independent with prosthetic management.    Time 4    Period Weeks    Status New    Target Date 12/25/21      PT SHORT TERM GOAL #3   Title Pt will increase Berg score from 21 to >30/56 for improved balance and decreased fall risk.    Baseline 12/02/21 21/56    Time 4    Period Weeks    Status Revised    Target Date 12/25/21      PT SHORT TERM GOAL #4   Title Pt will ambulate 200' with RW supervision for improved household ambulation.    Time 4    Period Weeks    Status New    Target Date 12/25/21  PT Long Term Goals - 12/02/21 1416       PT LONG TERM GOAL #1   Title Pt will be independent with progressive HEP to continue strength and balance gains on own. (LTGs due 02/18/22)    Time 12    Period Weeks    Status New    Target Date 02/18/22      PT LONG TERM GOAL #2   Title Pt will increase gait speed to >0.24m/s for improved household mobility.    Baseline 11/26/21 0.47m/s    Time 12    Period Weeks    Status New    Target Date 02/18/22      PT LONG TERM GOAL #3   Title Pt will increase Berg Balance to >40/56 for improved balance and decreased fall risk.    Baseline 12/02/21 21/56     Time 12    Period Weeks    Status Revised    Target Date 02/18/22      PT LONG TERM GOAL #4   Title Pt will ambulate >400' on varied level surfaces mod I with RW for improved short community distances.    Time 12    Period Weeks    Status New    Target Date 02/18/22      PT LONG TERM GOAL #5   Title Pt will ambulate up/down 4 steps with rails mod I for improved community access.    Time 12    Period Weeks    Status New    Target Date 02/18/22      PT LONG TERM GOAL #6   Title Pt will ambulate up/down ramp and curb with RW mod I for improved community access.    Time 12    Period Weeks    Status New    Target Date 02/18/22                   Plan - 12/02/21 1415     Clinical Impression Statement PT completed Merrilee Jansky that was started last session and updated goals. Pt is high fall risk with score of 21/56. Prosthetist had tighten knee some and pt had easier to keeping knee locked with gait. Worked on increasing left weight shift. Pt limited at times in standing with pain in right low back.    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 Stanford Breed. PMH includes extensive vascular surgery history (L femoral to peroneal bypass 12/2015; R EIA stent 2018; thrombectomy of L femoral to peroneal bypass and L peroneal artery 12/20; R femoral endarterectomy, profundoplasty with bovine pericardial patch on 03/2020), OA, DM, GERD, HLD, HTN, PAD, ACDF 2013. Chronic left shoulder pain    Examination-Activity Limitations Locomotion Level;Transfers;Stairs;Stand;Squat    Examination-Participation Restrictions Cleaning;Community Activity;Yard Work;Meal Prep    Stability/Clinical Decision Making Evolving/Moderate complexity    Rehab Potential Good    PT Frequency 2x / week   plus eval   PT Duration 12 weeks    PT Treatment/Interventions ADLs/Self Care Home Management;DME Instruction;Cryotherapy;Moist Heat;Gait training;Stair  training;Functional mobility training;Therapeutic activities;Therapeutic exercise;Balance training;Neuromuscular re-education;Manual techniques;Prosthetic Training;Passive range of motion;Vestibular;Patient/family education    PT Next Visit Plan Continue HEP at counter with focus on increasing weight shift over prosthesis, gait training with RW. Prosthetic education. How did increasing wear time to 3 hours 2x/day Saturday go?Geanie Cooley and Agree with Plan of Care Patient  Patient will benefit from skilled therapeutic intervention in order to improve the following deficits and impairments:  Abnormal gait, Decreased mobility, Decreased strength, Postural dysfunction, Prosthetic Dependency, Decreased range of motion, Decreased activity tolerance, Decreased balance, Decreased knowledge of use of DME, Decreased endurance  Visit Diagnosis: Other abnormalities of gait and mobility  Muscle weakness (generalized)  Abnormal posture  Unsteadiness on feet     Problem List Patient Active Problem List   Diagnosis Date Noted   Healthcare maintenance 08/17/2021   Loose stools 06/07/2021   Abnormal weight gain 03/30/2021   Anal fissure 03/30/2021   Fatty liver 03/30/2021   Flatulence, eructation and gas pain 03/30/2021   Hematochezia 03/30/2021   Imaging of gastrointestinal tract abnormal 03/30/2021   Personal history of colonic polyps 03/30/2021   Rectal pain 03/30/2021   Constipation 03/30/2021   Drug-induced constipation 03/30/2021   Dysphagia 03/30/2021   Colon cancer screening 03/30/2021   Right upper quadrant pain 03/30/2021   Gastroesophageal reflux disease 03/30/2021   Limb ischemia 02/19/2021   S/P above knee amputation, left ([place]) 02/19/2021   Symptomatic anemia 12/04/2020   PAOD (peripheral arterial occlusive disease) ([place]) 04/24/2020   Adhesive capsulitis 03/10/2020   Lung nodule < 6cm on CT 01/28/2019   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

## 2021-12-02 NOTE — Patient Instructions (Signed)
Access Code: [email or web address] URL: [email or web address] Date: 12/02/2021 Prepared by: Cherly Anderson  Exercises Lateral Weight Shift with Parallel Bars (AKA) - 2 x daily - 7 x weekly - 2 sets - 10 reps Weight Shifts to Step with Parallel Bars (AKA) - 2 x daily - 7 x weekly - 2 sets - 10 reps

## 2021-12-02 NOTE — Therapy (Signed)
[name] [address] [place], [place], [address] Phone: [phone]   Fax:  [phone]  Occupational Therapy Evaluation  Patient Details  Name: [name] MRN: [id number] Date of Birth: 01/27/1951 No data recorded  Encounter Date: 12/02/2021   OT End of Session - 12/02/21 1157     Visit Number 1    Number of Visits 9    Date for OT Re-Evaluation 01/09/21   (To account for week of Christmas)   Authorization Type [employer] MCR/MCD    OT Start Time 1100    OT Stop Time 1145    OT Time Calculation (min) 45 min    Activity Tolerance Patient tolerated treatment well    Behavior During Therapy [facility] for tasks assessed/performed             Past Medical History:  Diagnosis Date   Adhesive capsulitis 03/10/2020   Anemia    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 Vertell Limber neurosurgery   Chronic lower back pain    Claudication of both lower extremities ([place]) 07/15/2015   Critical lower limb ischemia ([place]) 12/19/2019   Diabetes mellitus    takes Metformin daily   GERD (gastroesophageal reflux disease)    takes Protonix daily   History of blood transfusion    "related to low HgB" ((09/10/2015   Hyperlipidemia    takes Vytorin daily   Hypertension    takes Benazepril and Bystolic daily   PAD (peripheral artery disease) ([place])    Pneumonia    Radiculopathy, cervical region 02/11/2017   Shortness of breath dyspnea    with exertion   Tobacco user    Toe fracture, right 05/09/2011   Wears glasses     Past Surgical History:  Procedure Laterality Date   ABDOMINAL AORTOGRAM W/LOWER EXTREMITY N/A 12/11/2017   Procedure: ABDOMINAL AORTOGRAM W/LOWER EXTREMITY;  Surgeon: Angelia Mould, MD;  Location: [place] [facility];  Service: Cardiovascular;  Laterality: N/A;   ABDOMINAL AORTOGRAM W/LOWER EXTREMITY  Bilateral 04/22/2020   Procedure: ABDOMINAL AORTOGRAM W/LOWER EXTREMITY;  Surgeon: Marty Heck, MD;  Location: [place] [facility];  Service: Cardiovascular;  Laterality: Bilateral;   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   AMPUTATION Left 02/19/2021   Procedure: LEFT ABOVE KNEE AMPUTATION;  Surgeon: Cherre Robins, MD;  Location: [place];  Service: Vascular;  Laterality: Left;   ANTERIOR CERVICAL DECOMP/DISCECTOMY FUSION  03/08/12   C6-7   ANTERIOR CERVICAL DECOMP/DISCECTOMY FUSION  03/08/2012   Procedure: ANTERIOR CERVICAL DECOMPRESSION/DISCECTOMY FUSION 1 LEVEL/HARDWARE REMOVAL;  Surgeon: Erline Levine, MD;  Location: [place] [facility]O [facility];  Service: Neurosurgery;  Laterality: N/A;  revison of C5-7 anterior cervical decompression with fusion with Cervical Five-Thoracic One anterior cervical decompression with fusion with interbody prothesis plating and bonegraft   [place]   BIOPSY  12/05/2020   Procedure: BIOPSY;  Surgeon: Ladene Artist, MD;  Location: [facility] ENDOSCOPY;  Service: Endoscopy;;   BYPASS GRAFT FEMORAL-PERONEAL Left 11/11/2016   Procedure: REDO LEFT FEMORAL-PERONEAL BYPASS WITH PROPATEN 6MM X 80CM GRAFT;  Surgeon: Angelia Mould, MD;  Location: [place];  Service: Vascular;  Laterality: Left;   COLONOSCOPY W/ BIOPSIES AND POLYPECTOMY  08/17/2012   f/u 5 years, 4 polyps, no high grade dysplasia or malignancy, tubular adenoma, hyperplastic polyops   COLONOSCOPY WITH PROPOFOL N/A 12/05/2020   Procedure: COLONOSCOPY  WITH PROPOFOL;  Surgeon: Ladene Artist, MD;  Location: [facility] ENDOSCOPY;  Service: Endoscopy;  Laterality: N/A;   EMBOLECTOMY Left 12/19/2019   Procedure: LEFT FEMERAL- PERONEAL THROMBECTOMY;  Surgeon: Marty Heck, MD;  Location: [place];  Service: Vascular;  Laterality: Left;   ENDARTERECTOMY FEMORAL Right 04/24/2020   Procedure: RIGHT FEMORAL ENDARTERECTOMY;  Surgeon: Marty Heck, MD;  Location: [place];  Service: Vascular;  Laterality: Right;    ENTEROSCOPY N/A 12/11/2015   Procedure: ENTEROSCOPY;  Surgeon: Carol Ada, MD;  Location: [place];  Service: Endoscopy;  Laterality: N/A;   ENTEROSCOPY N/A 03/29/2018   Procedure: ENTEROSCOPY;  Surgeon: Carol Ada, MD;  Location: [place];  Service: Endoscopy;  Laterality: N/A;   ESOPHAGOGASTRODUODENOSCOPY  08/17/2012   normal esophagus and GEJ, diffuse gastritis with erythema- no malignancy, reactive gastropathy  with focal intestinal metaplasia   ESOPHAGOGASTRODUODENOSCOPY (EGD) WITH PROPOFOL N/A 12/05/2020   Procedure: ESOPHAGOGASTRODUODENOSCOPY (EGD) WITH PROPOFOL;  Surgeon: Ladene Artist, MD;  Location: [facility] ENDOSCOPY;  Service: Endoscopy;  Laterality: N/A;   FEMORAL-POPLITEAL BYPASS GRAFT Left 01/06/2016   Procedure: Left  COMMON FEMORAL-BELOW KNEE POPLITEAL ARTERY Bypass using non-reversed translocated saphenous vein graft from left leg;  Surgeon: Mal Misty, MD;  Location: [place];  Service: Vascular;  Laterality: Left;   GIVENS CAPSULE STUDY N/A 11/24/2015   Procedure: GIVENS CAPSULE STUDY;  Surgeon: Juanita Craver, MD;  Location: [place];  Service: Endoscopy;  Laterality: N/A;   HOT HEMOSTASIS N/A 03/29/2018   Procedure: HOT HEMOSTASIS (ARGON PLASMA COAGULATION/BICAP);  Surgeon: Carol Ada, MD;  Location: [place];  Service: Endoscopy;  Laterality: N/A;   INGUINAL HERNIA REPAIR  1990's   right   INTRAOPERATIVE ARTERIOGRAM Left 01/06/2016   Procedure: INTRA OPERATIVE ARTERIOGRAM LEFT LOWER LEG;  Surgeon: Mal Misty, MD;  Location: [place];  Service: Vascular;  Laterality: Left;   INTRAOPERATIVE ARTERIOGRAM Left 11/11/2016   Procedure: INTRA OPERATIVE ARTERIOGRAM LEFT LOWER EXTRIMITY;  Surgeon: Angelia Mould, MD;  Location: [place];  Service: Vascular;  Laterality: Left;   IR ANGIOGRAM FOLLOW UP STUDY  12/11/2017   IR GENERIC HISTORICAL  10/24/2016   IR ANGIOGRAM FOLLOW UP STUDY   LOWER EXTREMITY ANGIOGRAM Bilateral 07/30/2015   Procedure: Lower Extremity  Angiogram;  Surgeon: Conrad Thorndale, MD;  Location: [place] [facility];  Service: Cardiovascular;  Laterality: Bilateral;   [place]   "lower"   PATCH ANGIOPLASTY Left 12/19/2019   Procedure: LEFT FEMORAL -PERONEAL PATCH ANGIOPLASTY WITH XENOSURE BIOLOGIC PATCH  ;  Surgeon: Marty Heck, MD;  Location: [place];  Service: Vascular;  Laterality: Left;   PATCH ANGIOPLASTY Right 04/24/2020   Procedure: Patch Angioplasty of Right Femoral Artery using Long Xenosure Biologic Patch 1cm x 14 cm;  Surgeon: Marty Heck, MD;  Location: [place];  Service: Vascular;  Laterality: Right;   PERIPHERAL VASCULAR CATHETERIZATION N/A 07/30/2015   Procedure: Abdominal Aortogram;  Surgeon: Conrad , MD;  Location: [place] [facility];  Service: Cardiovascular;  Laterality: N/A;   PERIPHERAL VASCULAR CATHETERIZATION N/A 10/24/2016   Procedure: Abdominal Aortogram;  Surgeon: Angelia Mould, MD;  Location: [place] [facility];  Service: Cardiovascular;  Laterality: N/A;   PERIPHERAL VASCULAR CATHETERIZATION N/A 10/24/2016   Procedure: Lower Extremity Angiography;  Surgeon: Angelia Mould, MD;  Location: [place] [facility];  Service: Cardiovascular;  Laterality: N/A;   PERIPHERAL VASCULAR INTERVENTION Right 12/11/2017   Procedure: PERIPHERAL VASCULAR INTERVENTION;  Surgeon: Angelia Mould,  MD;  Location: [place] [facility];  Service: Cardiovascular;  Laterality: Right;   VASCULAR SURGERY  ~ 2007   Stent SFA    VEIN HARVEST Left 01/06/2016   Procedure: LEFT GREATER SAPPHENOUS VEIN HARVEST;  Surgeon: Mal Misty, MD;  Location: [place];  Service: Vascular;  Laterality: Left;    There were no vitals filed for this visit.   Subjective Assessment - 12/02/21 1102     Pertinent History chronic Lt shoulder pain. 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 PMH: OA, DM, GERD, HLD, HTN, PAD, ACDF 2013.    Patient Stated Goals Increase shoulder motion and decrease pain    Currently in Pain? Yes     Pain Score 7    increased pain w/ sh abd, IR   Pain Location Shoulder    Pain Orientation Left    Pain Descriptors / Indicators Aching;Burning    Pain Type Chronic pain   1.5 YEARS   Pain Onset More than a month ago    Pain Frequency Constant    Aggravating Factors  Sh abd, IR, first thing in am    Pain Relieving Factors Ice, aspercreme (somewhat)               [facility] OT Assessment - 12/02/21 0001       Assessment   Medical Diagnosis chronic Lt shoulder pain   Lt AKA 02/19/21   Onset Date/Surgical Date --   Sh pain for approx 1.5 years   Hand Dominance Right    Prior Therapy none      Precautions   Precautions Fall    Precaution Comments Lt AKA      Balance Screen   Has the patient fallen in the past 6 months Yes    How many times? 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 Modified independent      [place] independently for small purchases   using scooter at [place] Does personal laundry completely   washes dishes, vacuum (while in w/c)   Meal Prep Plans, prepares and serves adequate meals independently   w/c level mostly, can stand for tasks   Programmer, applications own vehicle       Mobility   Mobility Status Comments uses w/c primarily      Written Expression   Dominant Hand Right    Handwriting 100% legible      Vision - History   Baseline Vision Wears glasses only for reading      Posture/Postural Control   Posture/Postural Control No significant limitations      Sensation  Light Touch Appears Intact      Coordination   9 Hole Peg Test Right;Left    Right 9 Hole Peg Test 36.91 SEC    Left 9 Hole Peg Test 36.78 SEC      Edema   Edema mild Rt hand at MP joints      ROM / Strength   AROM / PROM / Strength AROM      AROM   Overall AROM Comments RUE AROM WFL's except sup approx 75%, and decreased index finger MP flex d/t OA.    AROM Assessment Site Shoulder    Right/Left Shoulder Left    Left Shoulder Flexion 100 Degrees    Left Shoulder ABduction 110 Degrees    Left Shoulder Internal Rotation --   60%   Left Shoulder External Rotation --   90%     Strength   Overall Strength Comments MMT grossly 5/5 RUE, LUE 3+/5 w/ pain in flex, abd, 4/5 IR and ER      Hand Function   Right Hand Grip (lbs) 74 LBS    Left Hand Grip (lbs) 57 LBS                                   OT Long Term Goals - 12/02/21 1333       OT LONG TERM GOAL #1   Title Independent with HEP for Lt shoulder    Time 4    Period Weeks    Status New      OT LONG TERM GOAL #2   Title Pt report greater ease and less pain donning overhead shirt and jacket    Time 4    Period Weeks    Status New      OT LONG TERM GOAL #3   Title Pt to increase shoulder flexion to 110* or greater to retrieve light objects from overhead shelf    Baseline 100*    Time 4    Period Weeks    Status New      OT LONG TERM GOAL #4   Title Pt to id pain management strategies for Lt shoulder (modalities, positioning, etc)    Time 4    Period Weeks    Status New                   Plan - 12/02/21 1324     Clinical Impression Statement Pt is a 70 y.o. male who  presents to OPOT for chronic Lt shoulder pain. Pt also had Lt AKA on 02/19/21 d/t gangrene. Pt had diagnostic US of Lt shoulder in April 2022 which showed partial tear of subscapularis, Xray of Lt shoulder demo moderate degnerative changes of AC joint and mild GH changes. Pt presents today with decreased shoulder ROM and increased pain with sh flexion, abd, and IR. Pt would benefit from O.T. to address shoulder deficits and increase ease w/ overhead activities    OT Occupational Profile and History Problem Focused Assessment - Including review of records relating to presenting problem    Occupational performance deficits (Please refer to evaluation for details): ADL's;IADL's    Body Structure / Function / Physical Skills Strength;Pain;UE functional use;ROM;IADL;Body mechanics;Mobility    Rehab Potential Fair   chronic in nature, partial tear   Clinical Decision Making Limited treatment options, no task modification necessary    Comorbidities Affecting Occupational Performance: Presence of comorbidities impacting occupational performance  Comorbidities impacting occupational performance description: Lt AKA    Modification or Assistance to Complete Evaluation  No modification of tasks or assist necessary to complete eval    OT Frequency 2x / week    OT Duration 4 weeks   PLUS EVAL   OT Treatment/Interventions Self-care/ADL training;Moist Heat;Therapeutic activities;Ultrasound;Therapeutic exercise;Coping strategies training;Neuromuscular education;Cryotherapy;Passive range of motion;Functional Mobility Training;Patient/family education;Manual Therapy;Electrical Stimulation    Plan Shoulder HEP (supine), consider home TENS unit    Consulted and Agree with Plan of Care Patient             Patient will benefit from skilled therapeutic intervention in order to improve the following deficits and impairments:   Body Structure / Function / Physical Skills: Strength, Pain, UE functional use, ROM, IADL,  Body mechanics, Mobility       Visit Diagnosis: Chronic left shoulder pain  Muscle weakness (generalized)  Unsteadiness on feet    Problem List Patient Active Problem List   Diagnosis Date Noted   Healthcare maintenance 08/17/2021   Loose stools 06/07/2021   Abnormal weight gain 03/30/2021   Anal fissure 03/30/2021   Fatty liver 03/30/2021   Flatulence, eructation and gas pain 03/30/2021   Hematochezia 03/30/2021   Imaging of gastrointestinal tract abnormal 03/30/2021   Personal history of colonic polyps 03/30/2021   Rectal pain 03/30/2021   Constipation 03/30/2021   Drug-induced constipation 03/30/2021   Dysphagia 03/30/2021   Colon cancer screening 03/30/2021   Right upper quadrant pain 03/30/2021   Gastroesophageal reflux disease 03/30/2021   Limb ischemia 02/19/2021   S/P above knee amputation, left ([place]) 02/19/2021   Symptomatic anemia 12/04/2020   PAOD (peripheral arterial occlusive disease) ([place]) 04/24/2020   Adhesive capsulitis 03/10/2020   Lung nodule < 6cm on CT 01/28/2019   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 [place], [place], [address] Phone: [phone]   Fax:  [phone]  Name: [name] MRN: [id number] Date of Birth: [birth year]

## 2021-12-03 ENCOUNTER — Other Ambulatory Visit: Payer: Self-pay | Admitting: Student

## 2021-12-03 DIAGNOSIS — E7800 Pure hypercholesterolemia, unspecified: Secondary | ICD-10-CM

## 2021-12-08 ENCOUNTER — Other Ambulatory Visit: Payer: Self-pay

## 2021-12-08 ENCOUNTER — Ambulatory Visit: Payer: Medicare HMO | Admitting: Rehabilitation

## 2021-12-08 ENCOUNTER — Ambulatory Visit: Payer: Medicare HMO | Admitting: Occupational Therapy

## 2021-12-08 ENCOUNTER — Encounter: Payer: Self-pay | Admitting: Rehabilitation

## 2021-12-08 DIAGNOSIS — R2681 Unsteadiness on feet: Secondary | ICD-10-CM | POA: Diagnosis not present

## 2021-12-08 DIAGNOSIS — M25512 Pain in left shoulder: Secondary | ICD-10-CM

## 2021-12-08 DIAGNOSIS — M6281 Muscle weakness (generalized): Secondary | ICD-10-CM

## 2021-12-08 DIAGNOSIS — R293 Abnormal posture: Secondary | ICD-10-CM | POA: Diagnosis not present

## 2021-12-08 DIAGNOSIS — G8929 Other chronic pain: Secondary | ICD-10-CM

## 2021-12-08 DIAGNOSIS — R2689 Other abnormalities of gait and mobility: Secondary | ICD-10-CM

## 2021-12-08 NOTE — Patient Instructions (Signed)
Cranial Flexion: Overhead Arm Extension - Supine (Medicine Diona Foley)    Lie with knees bent, arms beyond head, holding paper towel roll. Bring overhead slowly within pain tolerance. Repeat _10___ times per set. Do __2__ sets per day.    ROM: External Rotation - Wand (Supine)    Lie on back holding wand with elbows bent to 90. Rotate forearms over head as far as possible, then towards belly button (keeping elbows out to side).  Repeat __10__ times per set. Do __2__ sets per day.  Cane Horizontal - Supine    With straight arms holding cane above shoulders, bring cane out to right, center, out to left, and back to above head. Repeat _10__ times. Do _2__ times per day. STOP if this continues to be painful   ROM: Abduction - Wand    Holding wand with left hand palm up, push wand directly out to side, leading with other hand palm down, until stretch is felt. Hold __2-3__ seconds. Guide down w/ support from Rt hand.  Repeat __10__ times per set.  Do __2__ sessions per day.

## 2021-12-08 NOTE — Therapy (Signed)
[facility] [address] [place], [place], [address] Phone: [phone]   Fax:  [phone]  Physical Therapy Treatment  Patient Details  Name: [name] MRN: [id number] Date of Birth: 15-Nov-1951 Referring Provider (PT): Risa Grill, [place]   Encounter Date: 12/08/2021   PT End of Session - 12/08/21 1536     Visit Number 3    Number of Visits 25    Date for PT Re-Evaluation 02/18/22    Authorization Type aetna medicare with medicaid secondary, 10th visit progress note    PT Start Time [phone]    PT Stop Time 0930    PT Time Calculation (min) 43 min    Equipment Utilized During Treatment Gait belt    Activity Tolerance Patient tolerated treatment well    Behavior During Therapy [facility] for tasks assessed/performed             Past Medical History:  Diagnosis Date   Adhesive capsulitis 03/10/2020   Anemia    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 Vertell Limber neurosurgery   Chronic lower back pain    Claudication of both lower extremities ([place]) 07/15/2015   Critical lower limb ischemia ([place]) 12/19/2019   Diabetes mellitus    takes Metformin daily   GERD (gastroesophageal reflux disease)    takes Protonix daily   History of blood transfusion    "related to low HgB" ((09/10/2015   Hyperlipidemia    takes Vytorin daily   Hypertension    takes Benazepril and Bystolic daily   PAD (peripheral artery disease) ([place])    Pneumonia    Radiculopathy, cervical region 02/11/2017   Shortness of breath dyspnea    with exertion   Tobacco user    Toe fracture, right 05/09/2011   Wears glasses     Past Surgical History:  Procedure Laterality Date   ABDOMINAL AORTOGRAM W/LOWER EXTREMITY N/A 12/11/2017   Procedure: ABDOMINAL AORTOGRAM W/LOWER EXTREMITY;  Surgeon: Angelia Mould, MD;  Location: [place] CV  LAB;  Service: Cardiovascular;  Laterality: N/A;   ABDOMINAL AORTOGRAM W/LOWER EXTREMITY Bilateral 04/22/2020   Procedure: ABDOMINAL AORTOGRAM W/LOWER EXTREMITY;  Surgeon: Marty Heck, MD;  Location: [place] [facility] LAB;  Service: Cardiovascular;  Laterality: Bilateral;   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   AMPUTATION Left 02/19/2021   Procedure: LEFT ABOVE KNEE AMPUTATION;  Surgeon: Cherre Robins, MD;  Location: [facility];  Service: Vascular;  Laterality: Left;   ANTERIOR CERVICAL DECOMP/DISCECTOMY FUSION  03/08/12   C6-7   ANTERIOR CERVICAL DECOMP/DISCECTOMY FUSION  03/08/2012   Procedure: ANTERIOR CERVICAL DECOMPRESSION/DISCECTOMY FUSION 1 LEVEL/HARDWARE REMOVAL;  Surgeon: Erline Levine, MD;  Location: [place] NEURO ORS;  Service: Neurosurgery;  Laterality: N/A;  revison of C5-7 anterior cervical decompression with fusion with Cervical Five-Thoracic One anterior cervical decompression with fusion with interbody prothesis plating and bonegraft   [place]   BIOPSY  12/05/2020   Procedure: BIOPSY;  Surgeon: Ladene Artist, MD;  Location: [facility] ENDOSCOPY;  Service: Endoscopy;;   BYPASS GRAFT FEMORAL-PERONEAL Left 11/11/2016   Procedure: REDO LEFT FEMORAL-PERONEAL BYPASS WITH PROPATEN 6MM X 80CM GRAFT;  Surgeon: Angelia Mould, MD;  Location: [facility];  Service: Vascular;  Laterality: Left;   COLONOSCOPY W/ BIOPSIES AND POLYPECTOMY  08/17/2012   f/u 5 years, 4 polyps, no high grade dysplasia or malignancy, tubular adenoma,  hyperplastic polyops   COLONOSCOPY WITH PROPOFOL N/A 12/05/2020   Procedure: COLONOSCOPY WITH PROPOFOL;  Surgeon: Ladene Artist, MD;  Location: [facility] ENDOSCOPY;  Service: Endoscopy;  Laterality: N/A;   EMBOLECTOMY Left 12/19/2019   Procedure: LEFT FEMERAL- PERONEAL THROMBECTOMY;  Surgeon: Marty Heck, MD;  Location: [facility];  Service: Vascular;  Laterality: Left;   ENDARTERECTOMY FEMORAL Right 04/24/2020   Procedure: RIGHT FEMORAL ENDARTERECTOMY;   Surgeon: Marty Heck, MD;  Location: [facility];  Service: Vascular;  Laterality: Right;   ENTEROSCOPY N/A 12/11/2015   Procedure: ENTEROSCOPY;  Surgeon: Carol Ada, MD;  Location: [facility];  Service: Endoscopy;  Laterality: N/A;   ENTEROSCOPY N/A 03/29/2018   Procedure: ENTEROSCOPY;  Surgeon: Carol Ada, MD;  Location: [facility];  Service: Endoscopy;  Laterality: N/A;   ESOPHAGOGASTRODUODENOSCOPY  08/17/2012   normal esophagus and GEJ, diffuse gastritis with erythema- no malignancy, reactive gastropathy  with focal intestinal metaplasia   ESOPHAGOGASTRODUODENOSCOPY (EGD) WITH PROPOFOL N/A 12/05/2020   Procedure: ESOPHAGOGASTRODUODENOSCOPY (EGD) WITH PROPOFOL;  Surgeon: Ladene Artist, MD;  Location: [facility] ENDOSCOPY;  Service: Endoscopy;  Laterality: N/A;   FEMORAL-POPLITEAL BYPASS GRAFT Left 01/06/2016   Procedure: Left  COMMON FEMORAL-BELOW KNEE POPLITEAL ARTERY Bypass using non-reversed translocated saphenous vein graft from left leg;  Surgeon: Mal Misty, MD;  Location: [facility];  Service: Vascular;  Laterality: Left;   GIVENS CAPSULE STUDY N/A 11/24/2015   Procedure: GIVENS CAPSULE STUDY;  Surgeon: Juanita Craver, MD;  Location: [facility];  Service: Endoscopy;  Laterality: N/A;   HOT HEMOSTASIS N/A 03/29/2018   Procedure: HOT HEMOSTASIS (ARGON PLASMA COAGULATION/BICAP);  Surgeon: Carol Ada, MD;  Location: [facility];  Service: Endoscopy;  Laterality: N/A;   INGUINAL HERNIA REPAIR  1990's   right   INTRAOPERATIVE ARTERIOGRAM Left 01/06/2016   Procedure: INTRA OPERATIVE ARTERIOGRAM LEFT LOWER LEG;  Surgeon: Mal Misty, MD;  Location: [facility];  Service: Vascular;  Laterality: Left;   INTRAOPERATIVE ARTERIOGRAM Left 11/11/2016   Procedure: INTRA OPERATIVE ARTERIOGRAM LEFT LOWER EXTRIMITY;  Surgeon: Angelia Mould, MD;  Location: [facility];  Service: Vascular;  Laterality: Left;   IR ANGIOGRAM FOLLOW UP STUDY  12/11/2017   IR GENERIC HISTORICAL  10/24/2016   IR ANGIOGRAM  FOLLOW UP STUDY   LOWER EXTREMITY ANGIOGRAM Bilateral 07/30/2015   Procedure: Lower Extremity Angiogram;  Surgeon: Conrad Baxter, MD;  Location: [place] CV LAB;  Service: Cardiovascular;  Laterality: Bilateral;   [place]   "lower"   PATCH ANGIOPLASTY Left 12/19/2019   Procedure: LEFT FEMORAL -PERONEAL PATCH ANGIOPLASTY WITH XENOSURE BIOLOGIC PATCH  ;  Surgeon: Marty Heck, MD;  Location: [facility] [place];  Service: Vascular;  Laterality: Left;   PATCH ANGIOPLASTY Right 04/24/2020   Procedure: Patch Angioplasty of Right Femoral Artery using Long Xenosure Biologic Patch 1cm x 14 cm;  Surgeon: Marty Heck, MD;  Location: [facility];  Service: Vascular;  Laterality: Right;   PERIPHERAL VASCULAR CATHETERIZATION N/A 07/30/2015   Procedure: Abdominal Aortogram;  Surgeon: Conrad Barlow, MD;  Location: [place] CV LAB;  Service: Cardiovascular;  Laterality: N/A;   PERIPHERAL VASCULAR CATHETERIZATION N/A 10/24/2016   Procedure: Abdominal Aortogram;  Surgeon: Angelia Mould, MD;  Location: [place] CV LAB;  Service: Cardiovascular;  Laterality: N/A;   PERIPHERAL VASCULAR CATHETERIZATION N/A 10/24/2016   Procedure: Lower Extremity Angiography;  Surgeon: Angelia Mould, MD;  Location: [place] [facility] LAB;  Service: Cardiovascular;  Laterality: N/A;   PERIPHERAL VASCULAR INTERVENTION  Right 12/11/2017   Procedure: PERIPHERAL VASCULAR INTERVENTION;  Surgeon: Angelia Mould, MD;  Location: [place] [facility] LAB;  Service: Cardiovascular;  Laterality: Right;   VASCULAR SURGERY  ~ 2007   Stent SFA    VEIN HARVEST Left 01/06/2016   Procedure: LEFT GREATER SAPPHENOUS VEIN HARVEST;  Surgeon: Mal Misty, MD;  Location: [facility];  Service: Vascular;  Laterality: Left;    There were no vitals filed for this visit.   Subjective Assessment - 12/08/21 0848     Subjective Doing good this morning.  Reports pain in medial/distal limb when finishing exercises.    Patient Stated  Goals Pt wants to get on his leg to be able to walk wihtout walker.    Currently in Pain? No/denies                               [facility] Adult PT Treatment/Exercise - 12/08/21 0851       Transfers   Transfers Sit to Stand;Stand to Sit    Sit to Stand 4: Min guard    Stand to Sit 4: Min guard;With armrests;With upper extremity assist    Comments Cues for hand placement and pulling prosthesis under him once in standing for better stability.      Ambulation/Gait   Ambulation/Gait Yes    Ambulation/Gait Assistance 4: Min guard;4: Min assist    Ambulation/Gait Assistance Details Focused on gait training today with RW.  Pt continues to take short steps on RLE and heavy reliance on UEs, causing fatigue in shoulders.  Provided cues for more upright posture, increased R step length and to perform a RW, step, RW step pattern to allow him to take larger steps without being too far inside of RW.  With gait, pt reporting pain in residual limb (outside) and feeling that his limb is moving inside of socket, therefore had pt sit.  He did not have socks with him but was able to get clinic 5ply sock.  Had him remove 1 ply sock and don 5 ply.  Ambulated another 61' and note that he is not seated in socket fully and upon sitting again, his limb is visibly not seated in socket.  Clinic did not have any other 1 ply or 3 ply to trial, therfore put 1 ply back on.  He does also report that he feels that socket is too high into groin.  Recommended he make another appt with [facility] to address these issues.  Pt verbalized understanding.    Ambulation Distance (Feet) 30 Feet   25' x 1, 65' x 1   Assistive device Rolling walker;Prosthesis    Gait Pattern Step-to pattern;Step-through pattern;Decreased stance time - left;Decreased weight shift to left    Ambulation Surface Level;Indoor      Prosthetics   Prosthetic Care Comments  Continue to educate on proper ply sock adjustment and bringing socks with him  if needing to adjust throughout the day, importance of building up wear time of prosthesis, performing prosthetic weight bearing (lifting/marching RLE) to get seated into socket better rather than "wiggling" as this can cause prosthesis to rotate.    Current prosthetic wear tolerance (days/week)  daily    Current prosthetic wear tolerance (#hours/day)  1.5-2hrs, 2xday    Current prosthetic weight-bearing tolerance (hours/day)  4-5 mins    Residual limb condition  Denies any skin issues    Education Provided Correct ply sock adjustment;Proper wear schedule/adjustment;Proper weight-bearing  schedule/adjustment    Person(s) Educated Patient    Education Method Explanation;Verbal cues    Education Method Needs further instruction    Donning Prosthesis Minimal assist                       PT Short Term Goals - 12/02/21 1415       PT SHORT TERM GOAL #1   Title Pt will be able to tolerate wearing prosthesis 8 hours/day for improved function.    Baseline currently 1-3 hours/day    Time 4    Status New    Target Date 12/25/21      PT SHORT TERM GOAL #2   Title Pt will be independent with prosthetic management.    Time 4    Period Weeks    Status New    Target Date 12/25/21      PT SHORT TERM GOAL #3   Title Pt will increase Berg score from 21 to >30/56 for improved balance and decreased fall risk.    Baseline 12/02/21 21/56    Time 4    Period Weeks    Status Revised    Target Date 12/25/21      PT SHORT TERM GOAL #4   Title Pt will ambulate 200' with RW supervision for improved household ambulation.    Time 4    Period Weeks    Status New    Target Date 12/25/21               PT Long Term Goals - 12/02/21 1416       PT LONG TERM GOAL #1   Title Pt will be independent with progressive HEP to continue strength and balance gains on own. (LTGs due 02/18/22)    Time 12    Period Weeks    Status New    Target Date 02/18/22      PT LONG TERM GOAL #2   Title  Pt will increase gait speed to >0.50m/s for improved household mobility.    Baseline 11/26/21 0.78m/s    Time 12    Period Weeks    Status New    Target Date 02/18/22      PT LONG TERM GOAL #3   Title Pt will increase Berg Balance to >40/56 for improved balance and decreased fall risk.    Baseline 12/02/21 21/56    Time 12    Period Weeks    Status Revised    Target Date 02/18/22      PT LONG TERM GOAL #4   Title Pt will ambulate >400' on varied level surfaces mod I with RW for improved short community distances.    Time 12    Period Weeks    Status New    Target Date 02/18/22      PT LONG TERM GOAL #5   Title Pt will ambulate up/down 4 steps with rails mod I for improved community access.    Time 12    Period Weeks    Status New    Target Date 02/18/22      PT LONG TERM GOAL #6   Title Pt will ambulate up/down ramp and curb with RW mod I for improved community access.    Time 12    Period Weeks    Status New    Target Date 02/18/22                   Plan - 12/08/21 1537  Clinical Impression Statement Skilled session focused on prosthetic education, esp ply sock adjustment and making appt with [facility] to address socket issues (likely needs to be trimmed as limb may be smaller now).  Pt encouraged to increase wear time and ambulate at home with S from family.    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 Stanford Breed. PMH includes extensive vascular surgery history (L femoral to peroneal bypass 12/2015; R EIA stent 2018; thrombectomy of L femoral to peroneal bypass and L peroneal artery 12/20; R femoral endarterectomy, profundoplasty with bovine pericardial patch on 03/2020), OA, DM, GERD, HLD, HTN, PAD, ACDF 2013. Chronic left shoulder pain    Examination-Activity Limitations Locomotion Level;Transfers;Stairs;Stand;Squat    Examination-Participation Restrictions Cleaning;Community Activity;Yard Work;Meal Prep     Stability/Clinical Decision Making Evolving/Moderate complexity    Rehab Potential Good    PT Frequency 2x / week   plus eval   PT Duration 12 weeks    PT Treatment/Interventions ADLs/Self Care Home Management;DME Instruction;Cryotherapy;Moist Heat;Gait training;Stair training;Functional mobility training;Therapeutic activities;Therapeutic exercise;Balance training;Neuromuscular re-education;Manual techniques;Prosthetic Training;Passive range of motion;Vestibular;Patient/family education    PT Next Visit Plan Did he get an appt with [facility]??  Continue HEP at counter with focus on increasing weight shift over prosthesis, gait training with RW. Prosthetic education. How did increasing wear time to 3 hours 2x/day Saturday go?Geanie Cooley and Agree with Plan of Care Patient             Patient will benefit from skilled therapeutic intervention in order to improve the following deficits and impairments:  Abnormal gait, Decreased mobility, Decreased strength, Postural dysfunction, Prosthetic Dependency, Decreased range of motion, Decreased activity tolerance, Decreased balance, Decreased knowledge of use of DME, Decreased endurance  Visit Diagnosis: Muscle weakness (generalized)  Unsteadiness on feet  Other abnormalities of gait and mobility  Abnormal posture     Problem List Patient Active Problem List   Diagnosis Date Noted   Healthcare maintenance 08/17/2021   Loose stools 06/07/2021   Abnormal weight gain 03/30/2021   Anal fissure 03/30/2021   Fatty liver 03/30/2021   Flatulence, eructation and gas pain 03/30/2021   Hematochezia 03/30/2021   Imaging of gastrointestinal tract abnormal 03/30/2021   Personal history of colonic polyps 03/30/2021   Rectal pain 03/30/2021   Constipation 03/30/2021   Drug-induced constipation 03/30/2021   Dysphagia 03/30/2021   Colon cancer screening 03/30/2021   Right upper quadrant pain 03/30/2021   Gastroesophageal reflux disease  03/30/2021   Limb ischemia 02/19/2021   S/P above knee amputation, left ([place]) 02/19/2021   Symptomatic anemia 12/04/2020   PAOD (peripheral arterial occlusive disease) ([place]) 04/24/2020   Adhesive capsulitis 03/10/2020   Lung nodule < 6cm on CT 01/28/2019   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 [place], [place], [address] Phone: [phone]   Fax:  [phone] 12/08/21, 3:40 PM   Name: ARTIS BEGGS MRN: [id number] Date of Birth: [birth year]

## 2021-12-08 NOTE — Therapy (Addendum)
[facility] [address] [place], [place], [address] Phone: [phone]   Fax:  [phone]  Occupational Therapy Treatment  Patient Details  Name: [name] MRN: [id number] Date of Birth: 1951/06/06 No data recorded  Encounter Date: 12/08/2021   OT End of Session - 12/08/21 0849     Visit Number 2    Number of Visits 9    Date for OT Re-Evaluation 01/09/21   (To account for week of Christmas)   Authorization Type [employer] MCR/MCD    OT Start Time 0800    OT Stop Time 0845    OT Time Calculation (min) 45 min    Activity Tolerance Patient tolerated treatment well    Behavior During Therapy [facility] for tasks assessed/performed             Past Medical History:  Diagnosis Date   Adhesive capsulitis 03/10/2020   Anemia    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 Vertell Limber neurosurgery   Chronic lower back pain    Claudication of both lower extremities ([place]) 07/15/2015   Critical lower limb ischemia ([place]) 12/19/2019   Diabetes mellitus    takes Metformin daily   GERD (gastroesophageal reflux disease)    takes Protonix daily   History of blood transfusion    "related to low HgB" ((09/10/2015   Hyperlipidemia    takes Vytorin daily   Hypertension    takes Benazepril and Bystolic daily   PAD (peripheral artery disease) ([place])    Pneumonia    Radiculopathy, cervical region 02/11/2017   Shortness of breath dyspnea    with exertion   Tobacco user    Toe fracture, right 05/09/2011   Wears glasses     Past Surgical History:  Procedure Laterality Date   ABDOMINAL AORTOGRAM W/LOWER EXTREMITY N/A 12/11/2017   Procedure: ABDOMINAL AORTOGRAM W/LOWER EXTREMITY;  Surgeon: Angelia Mould, MD;  Location: [place];  Service: Cardiovascular;  Laterality: N/A;   ABDOMINAL AORTOGRAM W/LOWER EXTREMITY  Bilateral 04/22/2020   Procedure: ABDOMINAL AORTOGRAM W/LOWER EXTREMITY;  Surgeon: Marty Heck, MD;  Location: [facility];  Service: Cardiovascular;  Laterality: Bilateral;   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   AMPUTATION Left 02/19/2021   Procedure: LEFT ABOVE KNEE AMPUTATION;  Surgeon: Cherre Robins, MD;  Location: [facility];  Service: Vascular;  Laterality: Left;   ANTERIOR CERVICAL DECOMP/DISCECTOMY FUSION  03/08/12   C6-7   ANTERIOR CERVICAL DECOMP/DISCECTOMY FUSION  03/08/2012   Procedure: ANTERIOR CERVICAL DECOMPRESSION/DISCECTOMY FUSION 1 LEVEL/HARDWARE REMOVAL;  Surgeon: Erline Levine, MD;  Location: [facility];  Service: Neurosurgery;  Laterality: N/A;  revison of C5-7 anterior cervical decompression with fusion with Cervical Five-Thoracic One anterior cervical decompression with fusion with interbody prothesis plating and bonegraft   [place]   BIOPSY  12/05/2020   Procedure: BIOPSY;  Surgeon: Ladene Artist, MD;  Location: [facility] ENDOSCOPY;  Service: Endoscopy;;   BYPASS GRAFT FEMORAL-PERONEAL Left 11/11/2016   Procedure: REDO LEFT FEMORAL-PERONEAL BYPASS WITH PROPATEN 6MM X 80CM GRAFT;  Surgeon: Angelia Mould, MD;  Location: [place] [facility] [place];  Service: Vascular;  Laterality: Left;   COLONOSCOPY W/ BIOPSIES AND POLYPECTOMY  08/17/2012   f/u 5 years, 4 polyps, no high grade dysplasia or malignancy, tubular adenoma, hyperplastic polyops   COLONOSCOPY WITH PROPOFOL N/A 12/05/2020   Procedure: COLONOSCOPY  WITH PROPOFOL;  Surgeon: Ladene Artist, MD;  Location: [facility];  Service: Endoscopy;  Laterality: N/A;   EMBOLECTOMY Left 12/19/2019   Procedure: LEFT FEMERAL- PERONEAL THROMBECTOMY;  Surgeon: Marty Heck, MD;  Location: [place];  Service: Vascular;  Laterality: Left;   ENDARTERECTOMY FEMORAL Right 04/24/2020   Procedure: RIGHT FEMORAL ENDARTERECTOMY;  Surgeon: Marty Heck, MD;  Location: [facility];  Service: Vascular;  Laterality: Right;    ENTEROSCOPY N/A 12/11/2015   Procedure: ENTEROSCOPY;  Surgeon: Carol Ada, MD;  Location: [facility] [place];  Service: Endoscopy;  Laterality: N/A;   ENTEROSCOPY N/A 03/29/2018   Procedure: ENTEROSCOPY;  Surgeon: Carol Ada, MD;  Location: [facility];  Service: Endoscopy;  Laterality: N/A;   ESOPHAGOGASTRODUODENOSCOPY  08/17/2012   normal esophagus and GEJ, diffuse gastritis with erythema- no malignancy, reactive gastropathy  with focal intestinal metaplasia   ESOPHAGOGASTRODUODENOSCOPY (EGD) WITH PROPOFOL N/A 12/05/2020   Procedure: ESOPHAGOGASTRODUODENOSCOPY (EGD) WITH PROPOFOL;  Surgeon: Ladene Artist, MD;  Location: [facility] ENDOSCOPY;  Service: Endoscopy;  Laterality: N/A;   FEMORAL-POPLITEAL BYPASS GRAFT Left 01/06/2016   Procedure: Left  COMMON FEMORAL-BELOW KNEE POPLITEAL ARTERY Bypass using non-reversed translocated saphenous vein graft from left leg;  Surgeon: Mal Misty, MD;  Location: [place];  Service: Vascular;  Laterality: Left;   GIVENS CAPSULE STUDY N/A 11/24/2015   Procedure: GIVENS CAPSULE STUDY;  Surgeon: Juanita Craver, MD;  Location: [place];  Service: Endoscopy;  Laterality: N/A;   HOT HEMOSTASIS N/A 03/29/2018   Procedure: HOT HEMOSTASIS (ARGON PLASMA COAGULATION/BICAP);  Surgeon: Carol Ada, MD;  Location: [facility];  Service: Endoscopy;  Laterality: N/A;   INGUINAL HERNIA REPAIR  1990's   right   INTRAOPERATIVE ARTERIOGRAM Left 01/06/2016   Procedure: INTRA OPERATIVE ARTERIOGRAM LEFT LOWER LEG;  Surgeon: Mal Misty, MD;  Location: [facility];  Service: Vascular;  Laterality: Left;   INTRAOPERATIVE ARTERIOGRAM Left 11/11/2016   Procedure: INTRA OPERATIVE ARTERIOGRAM LEFT LOWER EXTRIMITY;  Surgeon: Angelia Mould, MD;  Location: [facility];  Service: Vascular;  Laterality: Left;   IR ANGIOGRAM FOLLOW UP STUDY  12/11/2017   IR GENERIC HISTORICAL  10/24/2016   IR ANGIOGRAM FOLLOW UP STUDY   LOWER EXTREMITY ANGIOGRAM Bilateral 07/30/2015   Procedure: Lower Extremity  Angiogram;  Surgeon: Conrad [place], MD;  Location: [facility];  Service: Cardiovascular;  Laterality: Bilateral;   [place]   "lower"   PATCH ANGIOPLASTY Left 12/19/2019   Procedure: LEFT FEMORAL -PERONEAL PATCH ANGIOPLASTY WITH XENOSURE BIOLOGIC PATCH  ;  Surgeon: Marty Heck, MD;  Location: [facility];  Service: Vascular;  Laterality: Left;   PATCH ANGIOPLASTY Right 04/24/2020   Procedure: Patch Angioplasty of Right Femoral Artery using Long Xenosure Biologic Patch 1cm x 14 cm;  Surgeon: Marty Heck, MD;  Location: [facility];  Service: Vascular;  Laterality: Right;   PERIPHERAL VASCULAR CATHETERIZATION N/A 07/30/2015   Procedure: Abdominal Aortogram;  Surgeon: Conrad [place], MD;  Location: [place] [facility];  Service: Cardiovascular;  Laterality: N/A;   PERIPHERAL VASCULAR CATHETERIZATION N/A 10/24/2016   Procedure: Abdominal Aortogram;  Surgeon: Angelia Mould, MD;  Location: [place] [facility];  Service: Cardiovascular;  Laterality: N/A;   PERIPHERAL VASCULAR CATHETERIZATION N/A 10/24/2016   Procedure: Lower Extremity Angiography;  Surgeon: Angelia Mould, MD;  Location: [place] [facility];  Service: Cardiovascular;  Laterality: N/A;   PERIPHERAL VASCULAR INTERVENTION Right 12/11/2017   Procedure: PERIPHERAL VASCULAR INTERVENTION;  Surgeon: Angelia Mould,  MD;  Location: [place] [facility];  Service: Cardiovascular;  Laterality: Right;   VASCULAR SURGERY  ~ 2007   Stent SFA    VEIN HARVEST Left 01/06/2016   Procedure: LEFT GREATER SAPPHENOUS VEIN HARVEST;  Surgeon: Mal Misty, MD;  Location: [facility];  Service: Vascular;  Laterality: Left;    There were no vitals filed for this visit.   Subjective Assessment - 12/08/21 0807     Pertinent History chronic Lt shoulder pain. 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 PMH: OA, DM, GERD, HLD, HTN, PAD, ACDF 2013.    Patient Stated Goals Increase shoulder motion and decrease pain    Currently in Pain? Yes     Pain Score 7     Pain Location Shoulder    Pain Orientation Left    Pain Descriptors / Indicators Aching;Burning    Pain Type Chronic pain    Pain Onset More than a month ago    Pain Frequency Constant    Aggravating Factors  sh abd, IR             Pt transferred I'ly from w/c to mat (w/o prosthesis) and seated to supine.   Soft tissue mobs to surrounding shoulder musculature and light joint mobs to shoulder. Pt tender around shoulder (deltoid?) and pects major/minor and c/o burning along triceps at times. During this time, therapist also discussed used of TENS to shoulder for pain management and recommended pt purchase one for home use. Pt instructed that once TENS unit arrives, pt needs to bring to clinic so that therapist can set up and instruct in electrode placement.   Pt issued sh HEP mostly in supine - see pt instructions for details. Modified ER and horizontal abd/add ex's w/ 2 folded towels under Lt upper arm. Pt also instructed to not go as far w/ horizontal add LUE as this is when pain increased.  Seated: sh abduction using dowel w/ modifications to support arm coming down.                      OT Education - 12/08/21 [phone]     Education Details shoulder HEP    Person(s) Educated Patient    Methods Explanation;Demonstration;Handout;Verbal cues    Comprehension Verbalized understanding;Returned demonstration;Verbal cues required;Need further instruction              OT Short Term Goals - 12/08/21 0852       OT SHORT TERM GOAL #1   Title STG's = LTG's               OT Long Term Goals - 12/08/21 3790       OT LONG TERM GOAL #1   Title Independent with HEP for Lt shoulder    Time 4    Period Weeks    Status On-going      OT LONG TERM GOAL #2   Title Pt report greater ease and less pain donning overhead shirt and jacket    Time 4    Period Weeks    Status New      OT LONG TERM GOAL #3   Title Pt to increase shoulder flexion to  110* or greater to retrieve light objects from overhead shelf    Baseline 100*    Time 4    Period Weeks    Status New      OT LONG TERM GOAL #4   Title Pt to id pain management strategies for Lt shoulder (modalities,  positioning, etc)    Time 4    Period Weeks    Status New                   Plan - 12/08/21 0850     Clinical Impression Statement Pt tolerating HEP first 2 ex's well, but told to monitor last 2 ex's as these tend to cause pain w/o modifications. Pt instructed to stop these if pain increases.    OT Occupational Profile and History Problem Focused Assessment - Including review of records relating to presenting problem    Occupational performance deficits (Please refer to evaluation for details): ADL's;IADL's    Body Structure / Function / Physical Skills Strength;Pain;UE functional use;ROM;IADL;Body mechanics;Mobility    Rehab Potential Fair   chronic in nature, partial tear   Clinical Decision Making Limited treatment options, no task modification necessary    Comorbidities Affecting Occupational Performance: Presence of comorbidities impacting occupational performance    Comorbidities impacting occupational performance description: Lt AKA    Modification or Assistance to Complete Evaluation  No modification of tasks or assist necessary to complete eval    OT Frequency 2x / week    OT Duration 4 weeks   PLUS EVAL   OT Treatment/Interventions Self-care/ADL training;Moist Heat;Therapeutic activities;Ultrasound;Therapeutic exercise;Coping strategies training;Neuromuscular education;Cryotherapy;Passive range of motion;Functional Mobility Training;Patient/family education;Manual Therapy;Electrical Stimulation    Plan ultrasound to shoulder, review HEP, review recommendation for TENS device, scapula protraction/retraction at 27* sh flex supine    Consulted and Agree with Plan of Care Patient             Patient will benefit from skilled therapeutic intervention  in order to improve the following deficits and impairments:   Body Structure / Function / Physical Skills: Strength, Pain, UE functional use, ROM, IADL, Body mechanics, Mobility       Visit Diagnosis: Chronic left shoulder pain  Muscle weakness (generalized)    Problem List Patient Active Problem List   Diagnosis Date Noted   Healthcare maintenance 08/17/2021   Loose stools 06/07/2021   Abnormal weight gain 03/30/2021   Anal fissure 03/30/2021   Fatty liver 03/30/2021   Flatulence, eructation and gas pain 03/30/2021   Hematochezia 03/30/2021   Imaging of gastrointestinal tract abnormal 03/30/2021   Personal history of colonic polyps 03/30/2021   Rectal pain 03/30/2021   Constipation 03/30/2021   Drug-induced constipation 03/30/2021   Dysphagia 03/30/2021   Colon cancer screening 03/30/2021   Right upper quadrant pain 03/30/2021   Gastroesophageal reflux disease 03/30/2021   Limb ischemia 02/19/2021   S/P above knee amputation, left ([place]) 02/19/2021   Symptomatic anemia 12/04/2020   PAOD (peripheral arterial occlusive disease) ([place]) 04/24/2020   Adhesive capsulitis 03/10/2020   Lung nodule < 6cm on CT 01/28/2019   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 [place], [place], [address] Phone: [phone]   Fax:  [phone]  Name: [name] MRN: [id number] Date of Birth: 17-Jul-1951

## 2021-12-09 ENCOUNTER — Ambulatory Visit: Payer: Medicare HMO | Admitting: Rehabilitation

## 2021-12-09 ENCOUNTER — Ambulatory Visit: Payer: Medicare HMO | Admitting: Occupational Therapy

## 2021-12-13 ENCOUNTER — Ambulatory Visit: Payer: Medicare HMO

## 2021-12-13 ENCOUNTER — Ambulatory Visit: Payer: Medicare HMO | Admitting: Occupational Therapy

## 2021-12-15 ENCOUNTER — Ambulatory Visit: Payer: Medicare HMO | Admitting: Occupational Therapy

## 2021-12-15 ENCOUNTER — Ambulatory Visit: Payer: Medicare HMO | Admitting: Physical Therapy

## 2021-12-15 ENCOUNTER — Telehealth: Payer: Self-pay

## 2021-12-15 NOTE — Telephone Encounter (Signed)
Called and spoke with patient re: missed therapy appointments on 12/13/21 and to confirm today's appointments. Pt stated he was not feeling well and would not be coming to today's appts either. Pt encouraged to call to cancel appointments if he is not feeling well vs just not showing for appts.

## 2021-12-22 ENCOUNTER — Ambulatory Visit: Payer: Medicare HMO

## 2021-12-22 ENCOUNTER — Other Ambulatory Visit: Payer: Self-pay

## 2021-12-22 DIAGNOSIS — M25512 Pain in left shoulder: Secondary | ICD-10-CM | POA: Diagnosis not present

## 2021-12-22 DIAGNOSIS — R2681 Unsteadiness on feet: Secondary | ICD-10-CM | POA: Diagnosis not present

## 2021-12-22 DIAGNOSIS — R293 Abnormal posture: Secondary | ICD-10-CM | POA: Diagnosis not present

## 2021-12-22 DIAGNOSIS — R2689 Other abnormalities of gait and mobility: Secondary | ICD-10-CM

## 2021-12-22 DIAGNOSIS — M6281 Muscle weakness (generalized): Secondary | ICD-10-CM

## 2021-12-22 DIAGNOSIS — G8929 Other chronic pain: Secondary | ICD-10-CM | POA: Diagnosis not present

## 2021-12-22 NOTE — Therapy (Signed)
[facility] [address] [place], [place], [address] Phone: [phone]   Fax:  [phone]  Physical Therapy Treatment  Patient Details  Name: [name] MRN: [id number] Date of Birth: [birth year] Referring Provider (PT): Risa Grill, [place]   Encounter Date: 12/22/2021   PT End of Session - 12/22/21 1016     Visit Number 4    Number of Visits 25    Date for PT Re-Evaluation 02/18/22    Authorization Type aetna medicare with medicaid secondary, 10th visit progress note    PT Start Time 1015    PT Stop Time 1103    PT Time Calculation (min) 48 min    Equipment Utilized During Treatment Gait belt    Activity Tolerance Patient tolerated treatment well    Behavior During Therapy [facility] for tasks assessed/performed             Past Medical History:  Diagnosis Date   Adhesive capsulitis 03/10/2020   Anemia    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 Vertell Limber neurosurgery   Chronic lower back pain    Claudication of both lower extremities ([place]) 07/15/2015   Critical lower limb ischemia ([place]) 12/19/2019   Diabetes mellitus    takes Metformin daily   GERD (gastroesophageal reflux disease)    takes Protonix daily   History of blood transfusion    "related to low HgB" ((09/10/2015   Hyperlipidemia    takes Vytorin daily   Hypertension    takes Benazepril and Bystolic daily   PAD (peripheral artery disease) ([place])    Pneumonia    Radiculopathy, cervical region 02/11/2017   Shortness of breath dyspnea    with exertion   Tobacco user    Toe fracture, right 05/09/2011   Wears glasses     Past Surgical History:  Procedure Laterality Date   ABDOMINAL AORTOGRAM W/LOWER EXTREMITY N/A 12/11/2017   Procedure: ABDOMINAL AORTOGRAM W/LOWER EXTREMITY;  Surgeon: Angelia Mould, MD;  Location: [place] [facility];  Service: Cardiovascular;  Laterality: N/A;   ABDOMINAL AORTOGRAM W/LOWER EXTREMITY Bilateral 04/22/2020   Procedure: ABDOMINAL AORTOGRAM W/LOWER EXTREMITY;  Surgeon: Marty Heck, MD;  Location: [place] [facility];  Service: Cardiovascular;  Laterality: Bilateral;   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   AMPUTATION Left 02/19/2021   Procedure: LEFT ABOVE KNEE AMPUTATION;  Surgeon: Cherre Robins, MD;  Location: [place];  Service: Vascular;  Laterality: Left;   ANTERIOR CERVICAL DECOMP/DISCECTOMY FUSION  03/08/12   C6-7   ANTERIOR CERVICAL DECOMP/DISCECTOMY FUSION  03/08/2012   Procedure: ANTERIOR CERVICAL DECOMPRESSION/DISCECTOMY FUSION 1 LEVEL/HARDWARE REMOVAL;  Surgeon: Erline Levine, MD;  Location: [place] [facility]URO ORS;  Service: Neurosurgery;  Laterality: N/A;  revison of C5-7 anterior cervical decompression with fusion with Cervical Five-Thoracic One anterior cervical decompression with fusion with interbody prothesis plating and bonegraft   [place]   BIOPSY  12/05/2020   Procedure: BIOPSY;  Surgeon: Ladene Artist, MD;  Location: [facility] ENDOSCOPY;  Service: Endoscopy;;   BYPASS GRAFT FEMORAL-PERONEAL Left 11/11/2016   Procedure: REDO LEFT FEMORAL-PERONEAL BYPASS WITH PROPATEN 6MM X 80CM GRAFT;  Surgeon: Angelia Mould, MD;  Location: [place];  Service: Vascular;  Laterality: Left;   COLONOSCOPY W/ BIOPSIES AND POLYPECTOMY  08/17/2012   f/u 5 years, 4 polyps, no high grade dysplasia or malignancy, tubular adenoma,  hyperplastic polyops   COLONOSCOPY WITH PROPOFOL N/A 12/05/2020   Procedure: COLONOSCOPY WITH PROPOFOL;  Surgeon: Ladene Artist, MD;  Location: [facility] ENDOSCOPY;  Service: Endoscopy;  Laterality: N/A;   EMBOLECTOMY Left 12/19/2019   Procedure: LEFT FEMERAL- PERONEAL THROMBECTOMY;  Surgeon: Marty Heck, MD;  Location: [place];  Service: Vascular;  Laterality: Left;   ENDARTERECTOMY FEMORAL Right 04/24/2020   Procedure: RIGHT FEMORAL ENDARTERECTOMY;   Surgeon: Marty Heck, MD;  Location: [place];  Service: Vascular;  Laterality: Right;   ENTEROSCOPY N/A 12/11/2015   Procedure: ENTEROSCOPY;  Surgeon: Carol Ada, MD;  Location: [place];  Service: Endoscopy;  Laterality: N/A;   ENTEROSCOPY N/A 03/29/2018   Procedure: ENTEROSCOPY;  Surgeon: Carol Ada, MD;  Location: [place];  Service: Endoscopy;  Laterality: N/A;   ESOPHAGOGASTRODUODENOSCOPY  08/17/2012   normal esophagus and GEJ, diffuse gastritis with erythema- no malignancy, reactive gastropathy  with focal intestinal metaplasia   ESOPHAGOGASTRODUODENOSCOPY (EGD) WITH PROPOFOL N/A 12/05/2020   Procedure: ESOPHAGOGASTRODUODENOSCOPY (EGD) WITH PROPOFOL;  Surgeon: Ladene Artist, MD;  Location: [facility] ENDOSCOPY;  Service: Endoscopy;  Laterality: N/A;   FEMORAL-POPLITEAL BYPASS GRAFT Left 01/06/2016   Procedure: Left  COMMON FEMORAL-BELOW KNEE POPLITEAL ARTERY Bypass using non-reversed translocated saphenous vein graft from left leg;  Surgeon: Mal Misty, MD;  Location: [place];  Service: Vascular;  Laterality: Left;   GIVENS CAPSULE STUDY N/A 11/24/2015   Procedure: GIVENS CAPSULE STUDY;  Surgeon: Juanita Craver, MD;  Location: [place];  Service: Endoscopy;  Laterality: N/A;   HOT HEMOSTASIS N/A 03/29/2018   Procedure: HOT HEMOSTASIS (ARGON PLASMA COAGULATION/BICAP);  Surgeon: Carol Ada, MD;  Location: [place];  Service: Endoscopy;  Laterality: N/A;   INGUINAL HERNIA REPAIR  1990's   right   INTRAOPERATIVE ARTERIOGRAM Left 01/06/2016   Procedure: INTRA OPERATIVE ARTERIOGRAM LEFT LOWER LEG;  Surgeon: Mal Misty, MD;  Location: [place];  Service: Vascular;  Laterality: Left;   INTRAOPERATIVE ARTERIOGRAM Left 11/11/2016   Procedure: INTRA OPERATIVE ARTERIOGRAM LEFT LOWER EXTRIMITY;  Surgeon: Angelia Mould, MD;  Location: [place];  Service: Vascular;  Laterality: Left;   IR ANGIOGRAM FOLLOW UP STUDY  12/11/2017   IR GENERIC HISTORICAL  10/24/2016   IR ANGIOGRAM  FOLLOW UP STUDY   LOWER EXTREMITY ANGIOGRAM Bilateral 07/30/2015   Procedure: Lower Extremity Angiogram;  Surgeon: Conrad Roundup, MD;  Location: [place] [facility] LAB;  Service: Cardiovascular;  Laterality: Bilateral;   [place]   "lower"   PATCH ANGIOPLASTY Left 12/19/2019   Procedure: LEFT FEMORAL -PERONEAL PATCH ANGIOPLASTY WITH XENOSURE BIOLOGIC PATCH  ;  Surgeon: Marty Heck, MD;  Location: [place];  Service: Vascular;  Laterality: Left;   PATCH ANGIOPLASTY Right 04/24/2020   Procedure: Patch Angioplasty of Right Femoral Artery using Long Xenosure Biologic Patch 1cm x 14 cm;  Surgeon: Marty Heck, MD;  Location: [place];  Service: Vascular;  Laterality: Right;   PERIPHERAL VASCULAR CATHETERIZATION N/A 07/30/2015   Procedure: Abdominal Aortogram;  Surgeon: Conrad Marlboro, MD;  Location: [place] [facility];  Service: Cardiovascular;  Laterality: N/A;   PERIPHERAL VASCULAR CATHETERIZATION N/A 10/24/2016   Procedure: Abdominal Aortogram;  Surgeon: Angelia Mould, MD;  Location: [place] [facility];  Service: Cardiovascular;  Laterality: N/A;   PERIPHERAL VASCULAR CATHETERIZATION N/A 10/24/2016   Procedure: Lower Extremity Angiography;  Surgeon: Angelia Mould, MD;  Location: [place] [facility];  Service: Cardiovascular;  Laterality: N/A;   PERIPHERAL VASCULAR INTERVENTION  Right 12/11/2017   Procedure: PERIPHERAL VASCULAR INTERVENTION;  Surgeon: Angelia Mould, MD;  Location: [place] [facility];  Service: Cardiovascular;  Laterality: Right;   VASCULAR SURGERY  ~ 2007   Stent SFA    VEIN HARVEST Left 01/06/2016   Procedure: LEFT GREATER SAPPHENOUS VEIN HARVEST;  Surgeon: Mal Misty, MD;  Location: [place];  Service: Vascular;  Laterality: Left;    There were no vitals filed for this visit.   Subjective Assessment - 12/22/21 1017     Subjective Pt reports that he has not been feeling well which was why he missed last week. Did see the prosthetist and  he removed the liner from socket so he can sink down further in socket. Also gave him some more socks. Pt has worn leg once since then.    Patient Stated Goals Pt wants to get on his leg to be able to walk wihtout walker.    Currently in Pain? Yes    Pain Score 7     Pain Location Leg    Pain Orientation Left   under thigh   Pain Type Phantom pain;Acute pain    Pain Onset More than a month ago    Pain Frequency Intermittent    Aggravating Factors  when sitting a lot from pressure    Multiple Pain Sites Yes    Pain Score 8    Pain Location Shoulder    Pain Orientation Left    Pain Descriptors / Indicators Sharp    Pain Type Chronic pain    Pain Onset More than a month ago    Pain Frequency Intermittent    Aggravating Factors  raising arm                               [facility] Adult PT Treatment/Exercise - 12/22/21 1021       Transfers   Transfers Sit to Stand;Stand to Sit    Sit to Stand 5: Supervision;4: Min guard;With upper extremity assist    Stand to Sit 4: Min guard      Ambulation/Gait   Ambulation/Gait Yes    Ambulation/Gait Assistance 4: Min guard;4: Min assist    Ambulation/Gait Assistance Details Verba cues to weight shift over prosthesis and step past with right foot. To move walker forward more to allow him to step bigger with RLE. Tactile cues to facilitate weight shift. Also cues to pull back with gluts after heel strike on left to lock knee. Pts prosthetic foot appears to be turned in some.    Ambulation Distance (Feet) 115 Feet    Assistive device Rolling walker;Prosthesis    Gait Pattern Step-to pattern;Step-through pattern;Decreased stance time - left    Ambulation Surface Level;Indoor    Gait Comments In // bars: walking in bars with visual cues in mirror to work on increasing weight shift over prosthesis. Tapping 2" beam with RLE to facilitate left weight shift. Verbal cues to stand tall as well as tactile cues at pelvis to not rotate. Pt  reported pain in lateral residual limb with weight shift and then started to get some pain in groin towards end.      Prosthetics   Prosthetic Care Comments  Added 3 ply sock and pt able to demonstrate proper donning and rolling over upper lip of socket as socket liner had been removed by prosthetist.    Current prosthetic wear tolerance (days/week)  daily  Current prosthetic wear tolerance (#hours/day)  2 hours, 2x/day just liner    Residual limb condition  Denies any skin issues    Education Provided Correct ply sock adjustment;Proper Donning    Person(s) Educated Patient    Education Method Explanation;Verbal cues    Education Method Needs further instruction    Donning Prosthesis Minimal assist                       PT Short Term Goals - 12/02/21 1415       PT SHORT TERM GOAL #1   Title Pt will be able to tolerate wearing prosthesis 8 hours/day for improved function.    Baseline currently 1-3 hours/day    Time 4    Status New    Target Date 12/25/21      PT SHORT TERM GOAL #2   Title Pt will be independent with prosthetic management.    Time 4    Period Weeks    Status New    Target Date 12/25/21      PT SHORT TERM GOAL #3   Title Pt will increase Berg score from 21 to >30/56 for improved balance and decreased fall risk.    Baseline 12/02/21 21/56    Time 4    Period Weeks    Status Revised    Target Date 12/25/21      PT SHORT TERM GOAL #4   Title Pt will ambulate 200' with RW supervision for improved household ambulation.    Time 4    Period Weeks    Status New    Target Date 12/25/21               PT Long Term Goals - 12/02/21 1416       PT LONG TERM GOAL #1   Title Pt will be independent with progressive HEP to continue strength and balance gains on own. (LTGs due 02/18/22)    Time 12    Period Weeks    Status New    Target Date 02/18/22      PT LONG TERM GOAL #2   Title Pt will increase gait speed to >0.3m/s for improved household  mobility.    Baseline 11/26/21 0.55m/s    Time 12    Period Weeks    Status New    Target Date 02/18/22      PT LONG TERM GOAL #3   Title Pt will increase Berg Balance to >40/56 for improved balance and decreased fall risk.    Baseline 12/02/21 21/56    Time 12    Period Weeks    Status Revised    Target Date 02/18/22      PT LONG TERM GOAL #4   Title Pt will ambulate >400' on varied level surfaces mod I with RW for improved short community distances.    Time 12    Period Weeks    Status New    Target Date 02/18/22      PT LONG TERM GOAL #5   Title Pt will ambulate up/down 4 steps with rails mod I for improved community access.    Time 12    Period Weeks    Status New    Target Date 02/18/22      PT LONG TERM GOAL #6   Title Pt will ambulate up/down ramp and curb with RW mod I for improved community access.    Time 12    Period Weeks  Status New    Target Date 02/18/22                   Plan - 12/22/21 1918     Clinical Impression Statement Pt continues to have pain in lateral residual limb and also in groin with socket. Will be returning to prosthetist as prosthetist had removed socket liner. Was sititng down in leg better but still pain. Prosthetic foot also seems to be turned in some. Overall, pt showing better weight shift but need to get prosthetic fit right.    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 Stanford Breed. PMH includes extensive vascular surgery history (L femoral to peroneal bypass 12/2015; R EIA stent 2018; thrombectomy of L femoral to peroneal bypass and L peroneal artery 12/20; R femoral endarterectomy, profundoplasty with bovine pericardial patch on 03/2020), OA, DM, GERD, HLD, HTN, PAD, ACDF 2013. Chronic left shoulder pain    Examination-Activity Limitations Locomotion Level;Transfers;Stairs;Stand;Squat    Examination-Participation Restrictions Cleaning;Community Activity;Yard  Work;Meal Prep    Stability/Clinical Decision Making Evolving/Moderate complexity    Rehab Potential Good    PT Frequency 2x / week   plus eval   PT Duration 12 weeks    PT Treatment/Interventions ADLs/Self Care Home Management;DME Instruction;Cryotherapy;Moist Heat;Gait training;Stair training;Functional mobility training;Therapeutic activities;Therapeutic exercise;Balance training;Neuromuscular re-education;Manual techniques;Prosthetic Training;Passive range of motion;Vestibular;Patient/family education    PT Next Visit Plan Did he get an appt with [facility] as want to hold until does?? Check STGs. Continue HEP at counter with focus on increasing weight shift over prosthesis, gait training with RW. Prosthetic education. How did increasing wear time to 3 hours 2x/day Saturday go?Marland Kitchen    Consulted and Agree with Plan of Care Patient             Patient will benefit from skilled therapeutic intervention in order to improve the following deficits and impairments:  Abnormal gait, Decreased mobility, Decreased strength, Postural dysfunction, Prosthetic Dependency, Decreased range of motion, Decreased activity tolerance, Decreased balance, Decreased knowledge of use of DME, Decreased endurance  Visit Diagnosis: Other abnormalities of gait and mobility  Muscle weakness (generalized)  Unsteadiness on feet     Problem List Patient Active Problem List   Diagnosis Date Noted   Healthcare maintenance 08/17/2021   Loose stools 06/07/2021   Abnormal weight gain 03/30/2021   Anal fissure 03/30/2021   Fatty liver 03/30/2021   Flatulence, eructation and gas pain 03/30/2021   Hematochezia 03/30/2021   Imaging of gastrointestinal tract abnormal 03/30/2021   Personal history of colonic polyps 03/30/2021   Rectal pain 03/30/2021   Constipation 03/30/2021   Drug-induced constipation 03/30/2021   Dysphagia 03/30/2021   Colon cancer screening 03/30/2021   Right upper quadrant pain 03/30/2021    Gastroesophageal reflux disease 03/30/2021   Limb ischemia 02/19/2021   S/P above knee amputation, left ([place]) 02/19/2021   Symptomatic anemia 12/04/2020   PAOD (peripheral arterial occlusive disease) ([place]) 04/24/2020   Adhesive capsulitis 03/10/2020   Lung nodule < 6cm on CT 01/28/2019   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 [place], [place], [address] Phone: [phone]   Fax:  [phone]  Name: RAMIR MALERBA MRN: [id number] Date of Birth: 1951-03-12

## 2021-12-24 ENCOUNTER — Ambulatory Visit: Payer: Medicare HMO | Admitting: Occupational Therapy

## 2021-12-28 ENCOUNTER — Ambulatory Visit: Payer: Medicare HMO | Admitting: Occupational Therapy

## 2021-12-28 ENCOUNTER — Ambulatory Visit: Payer: Medicare HMO | Attending: Physician Assistant | Admitting: Rehabilitation

## 2021-12-28 DIAGNOSIS — R293 Abnormal posture: Secondary | ICD-10-CM | POA: Insufficient documentation

## 2021-12-28 DIAGNOSIS — G8929 Other chronic pain: Secondary | ICD-10-CM | POA: Insufficient documentation

## 2021-12-28 DIAGNOSIS — R2689 Other abnormalities of gait and mobility: Secondary | ICD-10-CM | POA: Insufficient documentation

## 2021-12-28 DIAGNOSIS — R2681 Unsteadiness on feet: Secondary | ICD-10-CM | POA: Insufficient documentation

## 2021-12-28 DIAGNOSIS — M25512 Pain in left shoulder: Secondary | ICD-10-CM | POA: Insufficient documentation

## 2021-12-28 DIAGNOSIS — M6281 Muscle weakness (generalized): Secondary | ICD-10-CM | POA: Insufficient documentation

## 2021-12-30 ENCOUNTER — Ambulatory Visit: Payer: Medicare HMO | Admitting: Occupational Therapy

## 2021-12-30 ENCOUNTER — Ambulatory Visit: Payer: Medicare HMO

## 2021-12-31 ENCOUNTER — Other Ambulatory Visit: Payer: Self-pay | Admitting: Student

## 2021-12-31 DIAGNOSIS — I10 Essential (primary) hypertension: Secondary | ICD-10-CM

## 2022-01-02 ENCOUNTER — Other Ambulatory Visit: Payer: Self-pay | Admitting: Student

## 2022-01-02 DIAGNOSIS — E7800 Pure hypercholesterolemia, unspecified: Secondary | ICD-10-CM

## 2022-01-04 ENCOUNTER — Ambulatory Visit: Payer: Medicare HMO | Admitting: Physical Therapy

## 2022-01-04 ENCOUNTER — Ambulatory Visit: Payer: Medicare HMO | Admitting: Occupational Therapy

## 2022-01-06 ENCOUNTER — Ambulatory Visit: Payer: Medicare HMO

## 2022-01-06 ENCOUNTER — Ambulatory Visit: Payer: Medicare HMO | Admitting: Occupational Therapy

## 2022-01-06 ENCOUNTER — Other Ambulatory Visit: Payer: Self-pay

## 2022-01-06 ENCOUNTER — Other Ambulatory Visit: Payer: Self-pay | Admitting: Student

## 2022-01-06 DIAGNOSIS — R2689 Other abnormalities of gait and mobility: Secondary | ICD-10-CM | POA: Diagnosis not present

## 2022-01-06 DIAGNOSIS — M25512 Pain in left shoulder: Secondary | ICD-10-CM | POA: Diagnosis not present

## 2022-01-06 DIAGNOSIS — R2681 Unsteadiness on feet: Secondary | ICD-10-CM | POA: Diagnosis not present

## 2022-01-06 DIAGNOSIS — R293 Abnormal posture: Secondary | ICD-10-CM

## 2022-01-06 DIAGNOSIS — M6281 Muscle weakness (generalized): Secondary | ICD-10-CM

## 2022-01-06 DIAGNOSIS — G8929 Other chronic pain: Secondary | ICD-10-CM

## 2022-01-06 DIAGNOSIS — I10 Essential (primary) hypertension: Secondary | ICD-10-CM

## 2022-01-06 NOTE — Therapy (Signed)
, [facility] [address] [place], [place], [address] Phone: [phone]   Fax:  [phone]  Physical Therapy Treatment  Patient Details  Name: [name] MRN: [id number] Date of Birth: 05-01-1951 Referring Provider (PT): Risa Grill, [place]   Encounter Date: 01/06/2022   PT End of Session - 01/06/22 1019     Visit Number 5    Number of Visits 25    Date for PT Re-Evaluation 02/18/22    Authorization Type aetna medicare with medicaid secondary, 10th visit progress note    PT Start Time 1015    PT Stop Time 1058    PT Time Calculation (min) 43 min    Equipment Utilized During Treatment Gait belt    Activity Tolerance Patient tolerated treatment well    Behavior During Therapy [facility] for tasks assessed/performed             Past Medical History:  Diagnosis Date   Adhesive capsulitis 03/10/2020   Anemia    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 Vertell Limber neurosurgery   Chronic lower back pain    Claudication of both lower extremities ([place]) 07/15/2015   Critical lower limb ischemia ([place]) 12/19/2019   Diabetes mellitus    takes Metformin daily   GERD (gastroesophageal reflux disease)    takes Protonix daily   History of blood transfusion    "related to low HgB" ((09/10/2015   Hyperlipidemia    takes Vytorin daily   Hypertension    takes Benazepril and Bystolic daily   PAD (peripheral artery disease) ([place])    Pneumonia    Radiculopathy, cervical region 02/11/2017   Shortness of breath dyspnea    with exertion   Tobacco user    Toe fracture, right 05/09/2011   Wears glasses     Past Surgical History:  Procedure Laterality Date   ABDOMINAL AORTOGRAM W/LOWER EXTREMITY N/A 12/11/2017   Procedure: ABDOMINAL AORTOGRAM W/LOWER EXTREMITY;  Surgeon: Angelia Mould, MD;  Location: [facility]  CV [facility];  Service: Cardiovascular;  Laterality: N/A;   ABDOMINAL AORTOGRAM W/LOWER EXTREMITY Bilateral 04/22/2020   Procedure: ABDOMINAL AORTOGRAM W/LOWER EXTREMITY;  Surgeon: Marty Heck, MD;  Location: [facility];  Service: Cardiovascular;  Laterality: Bilateral;   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   AMPUTATION Left 02/19/2021   Procedure: LEFT ABOVE KNEE AMPUTATION;  Surgeon: Cherre Robins, MD;  Location: [facility];  Service: Vascular;  Laterality: Left;   ANTERIOR CERVICAL DECOMP/DISCECTOMY FUSION  03/08/12   C6-7   ANTERIOR CERVICAL DECOMP/DISCECTOMY FUSION  03/08/2012   Procedure: ANTERIOR CERVICAL DECOMPRESSION/DISCECTOMY FUSION 1 LEVEL/HARDWARE REMOVAL;  Surgeon: Erline Levine, MD;  Location: [facility] NEURO ORS;  Service: Neurosurgery;  Laterality: N/A;  revison of C5-7 anterior cervical decompression with fusion with Cervical Five-Thoracic One anterior cervical decompression with fusion with interbody prothesis plating and bonegraft   [place]   BIOPSY  12/05/2020   Procedure: BIOPSY;  Surgeon: Ladene Artist, MD;  Location: [facility] ENDOSCOPY;  Service: Endoscopy;;   BYPASS GRAFT FEMORAL-PERONEAL Left 11/11/2016   Procedure: REDO LEFT FEMORAL-PERONEAL BYPASS WITH PROPATEN 6MM X 80CM GRAFT;  Surgeon: Angelia Mould, MD;  Location: [facility];  Service: Vascular;  Laterality: Left;   COLONOSCOPY W/ BIOPSIES AND POLYPECTOMY  08/17/2012   f/u 5 years, 4 polyps, no high grade dysplasia or malignancy, tubular  adenoma, hyperplastic polyops   COLONOSCOPY WITH PROPOFOL N/A 12/05/2020   Procedure: COLONOSCOPY WITH PROPOFOL;  Surgeon: Ladene Artist, MD;  Location: [facility] ENDOSCOPY;  Service: Endoscopy;  Laterality: N/A;   EMBOLECTOMY Left 12/19/2019   Procedure: LEFT FEMERAL- PERONEAL THROMBECTOMY;  Surgeon: Marty Heck, MD;  Location: [facility];  Service: Vascular;  Laterality: Left;   ENDARTERECTOMY FEMORAL Right 04/24/2020   Procedure: RIGHT FEMORAL ENDARTERECTOMY;   Surgeon: Marty Heck, MD;  Location: [facility];  Service: Vascular;  Laterality: Right;   ENTEROSCOPY N/A 12/11/2015   Procedure: ENTEROSCOPY;  Surgeon: Carol Ada, MD;  Location: [facility];  Service: Endoscopy;  Laterality: N/A;   ENTEROSCOPY N/A 03/29/2018   Procedure: ENTEROSCOPY;  Surgeon: Carol Ada, MD;  Location: [facility];  Service: Endoscopy;  Laterality: N/A;   ESOPHAGOGASTRODUODENOSCOPY  08/17/2012   normal esophagus and GEJ, diffuse gastritis with erythema- no malignancy, reactive gastropathy  with focal intestinal metaplasia   ESOPHAGOGASTRODUODENOSCOPY (EGD) WITH PROPOFOL N/A 12/05/2020   Procedure: ESOPHAGOGASTRODUODENOSCOPY (EGD) WITH PROPOFOL;  Surgeon: Ladene Artist, MD;  Location: [facility] ENDOSCOPY;  Service: Endoscopy;  Laterality: N/A;   FEMORAL-POPLITEAL BYPASS GRAFT Left 01/06/2016   Procedure: Left  COMMON FEMORAL-BELOW KNEE POPLITEAL ARTERY Bypass using non-reversed translocated saphenous vein graft from left leg;  Surgeon: Mal Misty, MD;  Location: [facility];  Service: Vascular;  Laterality: Left;   GIVENS CAPSULE STUDY N/A 11/24/2015   Procedure: GIVENS CAPSULE STUDY;  Surgeon: Juanita Craver, MD;  Location: [facility];  Service: Endoscopy;  Laterality: N/A;   HOT HEMOSTASIS N/A 03/29/2018   Procedure: HOT HEMOSTASIS (ARGON PLASMA COAGULATION/BICAP);  Surgeon: Carol Ada, MD;  Location: [facility];  Service: Endoscopy;  Laterality: N/A;   INGUINAL HERNIA REPAIR  1990's   right   INTRAOPERATIVE ARTERIOGRAM Left 01/06/2016   Procedure: INTRA OPERATIVE ARTERIOGRAM LEFT LOWER LEG;  Surgeon: Mal Misty, MD;  Location: [facility];  Service: Vascular;  Laterality: Left;   INTRAOPERATIVE ARTERIOGRAM Left 11/11/2016   Procedure: INTRA OPERATIVE ARTERIOGRAM LEFT LOWER EXTRIMITY;  Surgeon: Angelia Mould, MD;  Location: [facility];  Service: Vascular;  Laterality: Left;   IR ANGIOGRAM FOLLOW UP STUDY  12/11/2017   IR GENERIC HISTORICAL  10/24/2016   IR ANGIOGRAM  FOLLOW UP STUDY   LOWER EXTREMITY ANGIOGRAM Bilateral 07/30/2015   Procedure: Lower Extremity Angiogram;  Surgeon: Conrad [place], MD;  Location: [facility] CV LAB;  Service: Cardiovascular;  Laterality: Bilateral;   [place]   "lower"   PATCH ANGIOPLASTY Left 12/19/2019   Procedure: LEFT FEMORAL -PERONEAL PATCH ANGIOPLASTY WITH XENOSURE BIOLOGIC PATCH  ;  Surgeon: Marty Heck, MD;  Location: [facility];  Service: Vascular;  Laterality: Left;   PATCH ANGIOPLASTY Right 04/24/2020   Procedure: Patch Angioplasty of Right Femoral Artery using Long Xenosure Biologic Patch 1cm x 14 cm;  Surgeon: Marty Heck, MD;  Location: [facility];  Service: Vascular;  Laterality: Right;   PERIPHERAL VASCULAR CATHETERIZATION N/A 07/30/2015   Procedure: Abdominal Aortogram;  Surgeon: Conrad [place], MD;  Location: [facility] CV [facility];  Service: Cardiovascular;  Laterality: N/A;   PERIPHERAL VASCULAR CATHETERIZATION N/A 10/24/2016   Procedure: Abdominal Aortogram;  Surgeon: Angelia Mould, MD;  Location: [facility] CV LAB;  Service: Cardiovascular;  Laterality: N/A;   PERIPHERAL VASCULAR CATHETERIZATION N/A 10/24/2016   Procedure: Lower Extremity Angiography;  Surgeon: Angelia Mould, MD;  Location: [facility] CV [facility];  Service: Cardiovascular;  Laterality: N/A;   PERIPHERAL VASCULAR  INTERVENTION Right 12/11/2017   Procedure: PERIPHERAL VASCULAR INTERVENTION;  Surgeon: Angelia Mould, MD;  Location: [facility] [place];  Service: Cardiovascular;  Laterality: Right;   VASCULAR SURGERY  ~ 2007   Stent SFA    VEIN HARVEST Left 01/06/2016   Procedure: LEFT GREATER SAPPHENOUS VEIN HARVEST;  Surgeon: Mal Misty, MD;  Location: [facility];  Service: Vascular;  Laterality: Left;    There were no vitals filed for this visit.   Subjective Assessment - 01/06/22 1020     Subjective Pt reports that he saw [facility] yesterday and he adjusted knee so foot wasn't turning in. Pt returns on the  30th to [place]. Added socker liner back. Pt still having trouble with pain when wearing prosthesis >1 hour.    Patient Stated Goals Pt wants to get on his leg to be able to walk wihtout walker.    Currently in Pain? Yes    Pain Score 7     Pain Location Leg    Pain Orientation Left;Lateral    Pain Descriptors / Indicators Aching;Sharp    Pain Type Acute pain    Pain Onset More than a month ago    Pain Frequency Intermittent    Aggravating Factors  increasing pressure makes it worse.    Pain Onset More than a month ago                               [facility] Adult PT Treatment/Exercise - 01/06/22 1024       Transfers   Transfers Sit to Stand;Stand to Sit    Sit to Stand 5: Supervision;With upper extremity assist    Stand to Sit 5: Supervision      Ambulation/Gait   Ambulation/Gait Yes    Ambulation/Gait Assistance 4: Min guard    Ambulation/Gait Assistance Details Verbal cues to increase weight shift over prosthesis and increase right step. Also to keep walker far enough ahead when stepping in he doesn't run in to the front. Pt had to stop first bout due to increased pain lateral distal femur of residual limb. PT removed prosthesis and checked. Skin intact. Had pt abduct with resistance at this area and palpated what felt like the bone sliding over adipose tissue/scar tissue with pt reporting pain. When PT moved resistance to surround this area no pain. Applied 3 ply sock and redonned prosthesis. Instruction to try to weight shift over more to decrease torque in that area and had some initial improvement but pain started up again. PT going to speak with prosthetist, Mortimer Fries, to see if he has any ideas on how to decrease pressure in this area as really limiting pt. Also advised pt to call MD and see if they would recommend any changes to his gabapentin to help more with his phantom pain as only takes at night.    Ambulation Distance (Feet) 60 Feet   x 2   Assistive device  Rolling walker;Prosthesis    Gait Pattern Step-to pattern;Step-through pattern;Decreased stance time - left;Decreased step length - right;Decreased weight shift to left    Ambulation Surface Level;Indoor      Prosthetics   Prosthetic Care Comments  Pt not wearing sock upon arrival. Has socket liner back in place. Prosthetist adjusted knee so foot no longer turning in    Current prosthetic wear tolerance (days/week)  daily    Current prosthetic wear tolerance (#hours/day)  1 hour currently. PT discussed 1 hour  4x/day so he can tolerate more.    Residual limb condition  skin intact    Education Provided Correct ply sock adjustment;Skin check;Proper wear schedule/adjustment    Person(s) Educated Patient    Education Method Explanation    Education Method Verbalized understanding;Needs further instruction    Donning Prosthesis Minimal assist                     PT Education - 01/06/22 1909     Education Details Prosthetic education    Person(s) Educated Patient    Methods Explanation    Comprehension Verbalized understanding              PT Short Term Goals - 01/06/22 1910       PT SHORT TERM GOAL #1   Title Pt will be able to tolerate wearing prosthesis 8 hours/day for improved function.    Baseline currently 1-3 hours/day    Time 4    Status On-going    Target Date 01/25/22      PT SHORT TERM GOAL #2   Title Pt will be independent with prosthetic management.    Baseline 01/06/22 Pt still needing assistance for sock and wear time adjustment    Time 4    Period Weeks    Status On-going    Target Date 01/25/22      PT SHORT TERM GOAL #3   Title Pt will increase Berg score from 21 to >30/56 for improved balance and decreased fall risk.    Baseline 12/02/21 21/56.    Time 4    Period Weeks    Status Revised    Target Date 01/25/22      PT SHORT TERM GOAL #4   Title Pt will ambulate 200' with RW supervision for improved household ambulation.    Baseline  01/06/22 60' with RW CGA    Time 4    Period Weeks    Status On-going    Target Date 01/25/22               PT Long Term Goals - 12/02/21 1416       PT LONG TERM GOAL #1   Title Pt will be independent with progressive HEP to continue strength and balance gains on own. (LTGs due 02/18/22)    Time 12    Period Weeks    Status New    Target Date 02/18/22      PT LONG TERM GOAL #2   Title Pt will increase gait speed to >0.43m/s for improved household mobility.    Baseline 11/26/21 0.35m/s    Time 12    Period Weeks    Status New    Target Date 02/18/22      PT LONG TERM GOAL #3   Title Pt will increase Berg Balance to >40/56 for improved balance and decreased fall risk.    Baseline 12/02/21 21/56    Time 12    Period Weeks    Status Revised    Target Date 02/18/22      PT LONG TERM GOAL #4   Title Pt will ambulate >400' on varied level surfaces mod I with RW for improved short community distances.    Time 12    Period Weeks    Status New    Target Date 02/18/22      PT LONG TERM GOAL #5   Title Pt will ambulate up/down 4 steps with rails mod I for improved community access.  Time 12    Period Weeks    Status New    Target Date 02/18/22      PT LONG TERM GOAL #6   Title Pt will ambulate up/down ramp and curb with RW mod I for improved community access.    Time 12    Period Weeks    Status New    Target Date 02/18/22                   Plan - 01/06/22 1913     Clinical Impression Statement PT checked all initial STGs except fot Berg goal today. Pt has not met goals. Has had decreased attendance recently as was not feeling well and having ongoing issues with pain with prosthesis. Fit was better today after changes but still limited by lateral distal femur pain. Pt still needing education for prosthetic management. Pt was not able to increase gait distance but is showing improving weight shift. Pt will continue to benefit from skilled PT to continue to  progress towards goals. Updated STG dates.    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 Lenell Antu. PMH includes extensive vascular surgery history (L femoral to peroneal bypass 12/2015; R EIA stent 2018; thrombectomy of L femoral to peroneal bypass and L peroneal artery 12/20; R femoral endarterectomy, profundoplasty with bovine pericardial patch on 03/2020), OA, DM, GERD, HLD, HTN, PAD, ACDF 2013. Chronic left shoulder pain    Examination-Activity Limitations Locomotion Level;Transfers;Stairs;Stand;Squat    Examination-Participation Restrictions Cleaning;Community Activity;Yard Work;Meal Prep    Stability/Clinical Decision Making Evolving/Moderate complexity    Rehab Potential Good    PT Frequency 2x / week   plus eval   PT Duration 12 weeks    PT Treatment/Interventions ADLs/Self Care Home Management;DME Instruction;Cryotherapy;Moist Heat;Gait training;Stair training;Functional mobility training;Therapeutic activities;Therapeutic exercise;Balance training;Neuromuscular re-education;Manual techniques;Prosthetic Training;Passive range of motion;Vestibular;Patient/family education    PT Next Visit Plan Primary PT calling Reita Cliche to see if any suggestions to relieve pressure on lateral distal femur. Check Berg STG. Continue HEP at counter with focus on increasing weight shift over prosthesis, gait training with RW. Prosthetic education. Did pt increase to 1 hour 4 x/day with wear. He has been limited by pain.    Consulted and Agree with Plan of Care Patient             Patient will benefit from skilled therapeutic intervention in order to improve the following deficits and impairments:  Abnormal gait, Decreased mobility, Decreased strength, Postural dysfunction, Prosthetic Dependency, Decreased range of motion, Decreased activity tolerance, Decreased balance, Decreased knowledge of use of DME, Decreased endurance  Visit Diagnosis: Other  abnormalities of gait and mobility  Muscle weakness (generalized)  Unsteadiness on feet     Problem List Patient Active Problem List   Diagnosis Date Noted   Healthcare maintenance 08/17/2021   Loose stools 06/07/2021   Abnormal weight gain 03/30/2021   Anal fissure 03/30/2021   Fatty liver 03/30/2021   Flatulence, eructation and gas pain 03/30/2021   Hematochezia 03/30/2021   Imaging of gastrointestinal tract abnormal 03/30/2021   Personal history of colonic polyps 03/30/2021   Rectal pain 03/30/2021   Constipation 03/30/2021   Drug-induced constipation 03/30/2021   Dysphagia 03/30/2021   Colon cancer screening 03/30/2021   Right upper quadrant pain 03/30/2021   Gastroesophageal reflux disease 03/30/2021   Limb ischemia 02/19/2021   S/P above knee amputation, left (HCC) 02/19/2021   Symptomatic anemia 12/04/2020  PAOD (peripheral arterial occlusive disease) ([place]) 04/24/2020   Adhesive capsulitis 03/10/2020   Lung nodule < 6cm on CT 01/28/2019   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 ([place]) 01/18/2019   Acute pain of left lower extremity 12/10/2017   Depression 10/03/2016   Diabetic neuropathy ([place]) 02/27/2014   Iron deficiency anemia 08/01/2012   OBESITY, NOS 02/22/2007   Tobacco abuse 02/22/2007   HYPERTENSION, BENIGN SYSTEMIC 02/22/2007   PAD (peripheral artery disease) ([place]) 02/22/2007   Osteoarthritis 02/22/2007    Electa Sniff, PT, DPT, NCS 01/06/2022, 7:17 PM  [facility] [address] [place], [place], [address] Phone: [phone]   Fax:  [phone]  Name: [name] MRN: [id number] Date of Birth: 1951/09/23

## 2022-01-06 NOTE — Therapy (Signed)
[facility] [address] [place], [place], [address] Phone: [phone]   Fax:  [phone]  Occupational Therapy Treatment/Renewal  Patient Details  Name: [name] MRN: [id number] Date of Birth: [birth year] No data recorded  Encounter Date: 01/06/2022   OT End of Session - 01/06/22 1340     Visit Number 3    Number of Visits 10    Date for OT Re-Evaluation 02/09/22   renewal done on 01/06/22 - pt had only been seen for 2 visits prior to this   Authorization Type [employer] MCR/MCD    OT Start Time 1100    OT Stop Time 1145    OT Time Calculation (min) 45 min    Activity Tolerance Patient tolerated treatment well    Behavior During Therapy [facility] for tasks assessed/performed             Past Medical History:  Diagnosis Date   Adhesive capsulitis 03/10/2020   Anemia    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 Vertell Limber neurosurgery   Chronic lower back pain    Claudication of both lower extremities ([place]) 07/15/2015   Critical lower limb ischemia ([place]) 12/19/2019   Diabetes mellitus    takes Metformin daily   GERD (gastroesophageal reflux disease)    takes Protonix daily   History of blood transfusion    "related to low HgB" ((09/10/2015   Hyperlipidemia    takes Vytorin daily   Hypertension    takes Benazepril and Bystolic daily   PAD (peripheral artery disease) ([place])    Pneumonia    Radiculopathy, cervical region 02/11/2017   Shortness of breath dyspnea    with exertion   Tobacco user    Toe fracture, right 05/09/2011   Wears glasses     Past Surgical History:  Procedure Laterality Date   ABDOMINAL AORTOGRAM W/LOWER EXTREMITY N/A 12/11/2017   Procedure: ABDOMINAL AORTOGRAM W/LOWER EXTREMITY;  Surgeon: Angelia Mould, MD;  Location: [place] [facility];  Service: Cardiovascular;   Laterality: N/A;   ABDOMINAL AORTOGRAM W/LOWER EXTREMITY Bilateral 04/22/2020   Procedure: ABDOMINAL AORTOGRAM W/LOWER EXTREMITY;  Surgeon: Marty Heck, MD;  Location: [place] [facility];  Service: Cardiovascular;  Laterality: Bilateral;   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   AMPUTATION Left 02/19/2021   Procedure: LEFT ABOVE KNEE AMPUTATION;  Surgeon: Cherre Robins, MD;  Location: [place];  Service: Vascular;  Laterality: Left;   ANTERIOR CERVICAL DECOMP/DISCECTOMY FUSION  03/08/12   C6-7   ANTERIOR CERVICAL DECOMP/DISCECTOMY FUSION  03/08/2012   Procedure: ANTERIOR CERVICAL DECOMPRESSION/DISCECTOMY FUSION 1 LEVEL/HARDWARE REMOVAL;  Surgeon: Erline Levine, MD;  Location: [place] [facility];  Service: Neurosurgery;  Laterality: N/A;  revison of C5-7 anterior cervical decompression with fusion with Cervical Five-Thoracic One anterior cervical decompression with fusion with interbody prothesis plating and bonegraft   [place]   BIOPSY  12/05/2020   Procedure: BIOPSY;  Surgeon: Ladene Artist, MD;  Location: [facility] ENDOSCOPY;  Service: Endoscopy;;   BYPASS GRAFT FEMORAL-PERONEAL Left 11/11/2016   Procedure: REDO LEFT FEMORAL-PERONEAL BYPASS WITH PROPATEN 6MM X 80CM GRAFT;  Surgeon: Angelia Mould, MD;  Location: [place];  Service: Vascular;  Laterality: Left;   COLONOSCOPY W/ BIOPSIES AND POLYPECTOMY  08/17/2012   f/u 5 years, 4 polyps, no high grade dysplasia or malignancy, tubular adenoma, hyperplastic polyops  COLONOSCOPY WITH PROPOFOL N/A 12/05/2020   Procedure: COLONOSCOPY WITH PROPOFOL;  Surgeon: Ladene Artist, MD;  Location: [facility] ENDOSCOPY;  Service: Endoscopy;  Laterality: N/A;   EMBOLECTOMY Left 12/19/2019   Procedure: LEFT FEMERAL- PERONEAL THROMBECTOMY;  Surgeon: Marty Heck, MD;  Location: [place];  Service: Vascular;  Laterality: Left;   ENDARTERECTOMY FEMORAL Right 04/24/2020   Procedure: RIGHT FEMORAL ENDARTERECTOMY;  Surgeon: Marty Heck, MD;   Location: [place];  Service: Vascular;  Laterality: Right;   ENTEROSCOPY N/A 12/11/2015   Procedure: ENTEROSCOPY;  Surgeon: Carol Ada, MD;  Location: [place];  Service: Endoscopy;  Laterality: N/A;   ENTEROSCOPY N/A 03/29/2018   Procedure: ENTEROSCOPY;  Surgeon: Carol Ada, MD;  Location: [place];  Service: Endoscopy;  Laterality: N/A;   ESOPHAGOGASTRODUODENOSCOPY  08/17/2012   normal esophagus and GEJ, diffuse gastritis with erythema- no malignancy, reactive gastropathy  with focal intestinal metaplasia   ESOPHAGOGASTRODUODENOSCOPY (EGD) WITH PROPOFOL N/A 12/05/2020   Procedure: ESOPHAGOGASTRODUODENOSCOPY (EGD) WITH PROPOFOL;  Surgeon: Ladene Artist, MD;  Location: [facility] ENDOSCOPY;  Service: Endoscopy;  Laterality: N/A;   FEMORAL-POPLITEAL BYPASS GRAFT Left 01/06/2016   Procedure: Left  COMMON FEMORAL-BELOW KNEE POPLITEAL ARTERY Bypass using non-reversed translocated saphenous vein graft from left leg;  Surgeon: Mal Misty, MD;  Location: [place];  Service: Vascular;  Laterality: Left;   GIVENS CAPSULE STUDY N/A 11/24/2015   Procedure: GIVENS CAPSULE STUDY;  Surgeon: Juanita Craver, MD;  Location: [place];  Service: Endoscopy;  Laterality: N/A;   HOT HEMOSTASIS N/A 03/29/2018   Procedure: HOT HEMOSTASIS (ARGON PLASMA COAGULATION/BICAP);  Surgeon: Carol Ada, MD;  Location: [place];  Service: Endoscopy;  Laterality: N/A;   INGUINAL HERNIA REPAIR  1990's   right   INTRAOPERATIVE ARTERIOGRAM Left 01/06/2016   Procedure: INTRA OPERATIVE ARTERIOGRAM LEFT LOWER LEG;  Surgeon: Mal Misty, MD;  Location: [place];  Service: Vascular;  Laterality: Left;   INTRAOPERATIVE ARTERIOGRAM Left 11/11/2016   Procedure: INTRA OPERATIVE ARTERIOGRAM LEFT LOWER EXTRIMITY;  Surgeon: Angelia Mould, MD;  Location: [place];  Service: Vascular;  Laterality: Left;   IR ANGIOGRAM FOLLOW UP STUDY  12/11/2017   IR GENERIC HISTORICAL  10/24/2016   IR ANGIOGRAM FOLLOW UP STUDY   LOWER EXTREMITY  ANGIOGRAM Bilateral 07/30/2015   Procedure: Lower Extremity Angiogram;  Surgeon: Conrad [place], MD;  Location: [place] [facility];  Service: Cardiovascular;  Laterality: Bilateral;   [place]   "lower"   PATCH ANGIOPLASTY Left 12/19/2019   Procedure: LEFT FEMORAL -PERONEAL PATCH ANGIOPLASTY WITH XENOSURE BIOLOGIC PATCH  ;  Surgeon: Marty Heck, MD;  Location: [place];  Service: Vascular;  Laterality: Left;   PATCH ANGIOPLASTY Right 04/24/2020   Procedure: Patch Angioplasty of Right Femoral Artery using Long Xenosure Biologic Patch 1cm x 14 cm;  Surgeon: Marty Heck, MD;  Location: [place];  Service: Vascular;  Laterality: Right;   PERIPHERAL VASCULAR CATHETERIZATION N/A 07/30/2015   Procedure: Abdominal Aortogram;  Surgeon: Conrad [place], MD;  Location: [place] [facility];  Service: Cardiovascular;  Laterality: N/A;   PERIPHERAL VASCULAR CATHETERIZATION N/A 10/24/2016   Procedure: Abdominal Aortogram;  Surgeon: Angelia Mould, MD;  Location: [place] [facility];  Service: Cardiovascular;  Laterality: N/A;   PERIPHERAL VASCULAR CATHETERIZATION N/A 10/24/2016   Procedure: Lower Extremity Angiography;  Surgeon: Angelia Mould, MD;  Location: [place] [facility];  Service: Cardiovascular;  Laterality: N/A;   PERIPHERAL VASCULAR INTERVENTION Right 12/11/2017  Procedure: PERIPHERAL VASCULAR INTERVENTION;  Surgeon: Angelia Mould, MD;  Location: [place] [facility];  Service: Cardiovascular;  Laterality: Right;   VASCULAR SURGERY  ~ 2007   Stent SFA    VEIN HARVEST Left 01/06/2016   Procedure: LEFT GREATER SAPPHENOUS VEIN HARVEST;  Surgeon: Mal Misty, MD;  Location: [place];  Service: Vascular;  Laterality: Left;    There were no vitals filed for this visit.   Subjective Assessment - 01/06/22 1144     Subjective  Pt returns today after a month of not being seen - pt reports same issues with shoulder    Pertinent History chronic Lt shoulder pain. 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 PMH: OA, DM, GERD, HLD, HTN, PAD, ACDF 2013.    Patient Stated Goals Increase shoulder motion and decrease pain    Currently in Pain? Yes    Pain Score 7     Pain Location Shoulder    Pain Orientation Left    Pain Descriptors / Indicators Shooting;Sore    Pain Type Acute pain             Discussion re: attendance and no show policy as pt has not returned for O.T. since 12/08/21. Pt did have illness and cancelled last week but no showed for prior appointments. Pt agreed to commit to coming regularly going forward and is aware he needs to cancel if ill or cannot make it to future appts.   Reviewed previously issued HEP and return demo x 10 reps each w/ min cueing for correct technique/positioning. Pt denies any increase in shoulder pain w/ HEP  Discussed use of TENS for shoulder pain and provided handout on device to purchase if interested.   Kinesiotaped Lt shoulder for pain management to relax middle deltoid and upper traps d/t soreness/pain in these locations.   Suspect possible rotator cuff involvement (specifically supraspinatus) d/t pain with shoulder abd. and lowering slowly. Pt's MD had ordered MRI but cost prohibits patient from having MRI as insurance does not cover (per pt report). Pt has at least 75%-90% P/ROM in shoulder flexion, therefore does not appear to be adhesive capsulitis.                        OT Short Term Goals - 12/08/21 0852       OT SHORT TERM GOAL #1   Title STG's = LTG's               OT Long Term Goals - 12/08/21 4098       OT LONG TERM GOAL #1   Title Independent with HEP for Lt shoulder    Time 4    Period Weeks    Status On-going      OT LONG TERM GOAL #2   Title Pt report greater ease and less pain donning overhead shirt and jacket    Time 4    Period Weeks    Status New      OT LONG TERM GOAL #3   Title Pt to increase shoulder flexion to 110* or greater to retrieve light objects from  overhead shelf    Baseline 100*    Time 4    Period Weeks    Status New      OT LONG TERM GOAL #4   Title Pt to id pain management strategies for Lt shoulder (modalities, positioning, etc)    Time 4    Period Weeks    Status  New                   Plan - 01/06/22 1341     Clinical Impression Statement Pt has not returned since 12/08/21. Pt was seen today for first time since then and renewal completed today to extend time. All goals were reviewed and still appropriate for updated POC    OT Occupational Profile and History Problem Focused Assessment - Including review of records relating to presenting problem    Occupational performance deficits (Please refer to evaluation for details): ADL's;IADL's    Body Structure / Function / Physical Skills Strength;Pain;UE functional use;ROM;IADL;Body mechanics;Mobility    Rehab Potential Fair   chronic in nature, partial tear   Clinical Decision Making Limited treatment options, no task modification necessary    Comorbidities Affecting Occupational Performance: Presence of comorbidities impacting occupational performance    Comorbidities impacting occupational performance description: Lt AKA    Modification or Assistance to Complete Evaluation  No modification of tasks or assist necessary to complete eval    OT Frequency 2x / week    OT Duration 4 weeks    OT Treatment/Interventions Self-care/ADL training;Moist Heat;Therapeutic activities;Ultrasound;Therapeutic exercise;Coping strategies training;Neuromuscular education;Cryotherapy;Passive range of motion;Functional Mobility Training;Patient/family education;Manual Therapy;Electrical Stimulation    Plan ultrasound to shoulder, scapula protraction/retraction at 90* sh flex supine    Consulted and Agree with Plan of Care Patient             Patient will benefit from skilled therapeutic intervention in order to improve the following deficits and impairments:   Body Structure /  Function / Physical Skills: Strength, Pain, UE functional use, ROM, IADL, Body mechanics, Mobility       Visit Diagnosis: Chronic left shoulder pain  Muscle weakness (generalized)  Abnormal posture  Unsteadiness on feet    Problem List Patient Active Problem List   Diagnosis Date Noted   Healthcare maintenance 08/17/2021   Loose stools 06/07/2021   Abnormal weight gain 03/30/2021   Anal fissure 03/30/2021   Fatty liver 03/30/2021   Flatulence, eructation and gas pain 03/30/2021   Hematochezia 03/30/2021   Imaging of gastrointestinal tract abnormal 03/30/2021   Personal history of colonic polyps 03/30/2021   Rectal pain 03/30/2021   Constipation 03/30/2021   Drug-induced constipation 03/30/2021   Dysphagia 03/30/2021   Colon cancer screening 03/30/2021   Right upper quadrant pain 03/30/2021   Gastroesophageal reflux disease 03/30/2021   Limb ischemia 02/19/2021   S/P above knee amputation, left (HCC) 02/19/2021   Symptomatic anemia 12/04/2020   PAOD (peripheral arterial occlusive disease) ([place]) 04/24/2020   Adhesive capsulitis 03/10/2020   Lung nodule < 6cm on CT 01/28/2019   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 [place], [place], [address] Phone: [phone]   Fax:  [phone]  Name: [name] MRN: [id number] Date of Birth: [birth year]

## 2022-01-11 ENCOUNTER — Ambulatory Visit (HOSPITAL_COMMUNITY)
Admission: RE | Admit: 2022-01-11 | Discharge: 2022-01-11 | Disposition: A | Discharge status: Home / Self Care | Payer: Medicare HMO | Source: Ambulatory Visit | Attending: Vascular Surgery | Admitting: Vascular Surgery

## 2022-01-11 ENCOUNTER — Ambulatory Visit (INDEPENDENT_AMBULATORY_CARE_PROVIDER_SITE_OTHER): Payer: Medicare HMO | Admitting: Physician Assistant

## 2022-01-11 ENCOUNTER — Other Ambulatory Visit: Payer: Self-pay

## 2022-01-11 VITALS — BP 146/74 | HR 74 | Temp 97.4°F | Resp 18 | Ht 67.0 in | Wt 170.0 lb

## 2022-01-11 DIAGNOSIS — I779 Disorder of arteries and arterioles, unspecified: Secondary | ICD-10-CM | POA: Diagnosis not present

## 2022-01-11 DIAGNOSIS — I70229 Atherosclerosis of native arteries of extremities with rest pain, unspecified extremity: Secondary | ICD-10-CM | POA: Diagnosis not present

## 2022-01-11 DIAGNOSIS — S78112A Complete traumatic amputation at level between left hip and knee, initial encounter: Secondary | ICD-10-CM | POA: Diagnosis not present

## 2022-01-11 MED ORDER — GABAPENTIN 300 MG PO CAPS: 300.0000 mg | ORAL_CAPSULE | Freq: Three times a day (TID) | ORAL | 1 refills | Status: DC | PRN

## 2022-01-11 NOTE — Progress Notes (Signed)
Office Note     CC:  follow up Requesting Provider:  Precious Gilding, DO  HPI: [name] is a 71 y.o. (Jul 08, 1951) male who presents to evaluate L AKA stump.  This was performed by Dr. Stanford Breed 02/19/21.  He also has history if R femoral endarterectomy for rest pain 04/24/20 by Dr. Carlis Abbott and history if R EIA stent in 2018 by Dr. Scot Dock.   He was evaluated by [facility] last week and referred back to our office due to worsening left AKA stump pain.  He is currently working with physical therapy with his prosthetic leg.  He denies any fall onto his stump.  His pain is worsening and he is unsure of etiology.  He denies any fevers, chills, nausea/vomiting.  He also denies any claudication, rest pain, or nonhealing wounds of his right leg.  He is an occasional smoker.  He is on Plavix and statin daily.   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 Vertell Limber neurosurgery   Chronic lower back pain    Claudication of both lower extremities ([place]) 07/15/2015   Critical lower limb ischemia ([place]) 12/19/2019   Diabetes mellitus    takes Metformin daily   GERD (gastroesophageal reflux disease)    takes Protonix daily   History of blood transfusion    "related to low HgB" ((09/10/2015   Hyperlipidemia    takes Vytorin daily   Hypertension    takes Benazepril and Bystolic daily   PAD (peripheral artery disease) (HCC)    Pneumonia    Radiculopathy, cervical region 02/11/2017   Shortness of breath dyspnea    with exertion   Tobacco user    Toe fracture, right 05/09/2011   Wears glasses     Past Surgical History:  Procedure Laterality Date   ABDOMINAL AORTOGRAM W/LOWER EXTREMITY N/A 12/11/2017   Procedure: ABDOMINAL AORTOGRAM W/LOWER EXTREMITY;  Surgeon: Angelia Mould, MD;  Location: [facility];  Service: Cardiovascular;  Laterality: N/A;   ABDOMINAL AORTOGRAM W/LOWER EXTREMITY Bilateral 04/22/2020   Procedure: ABDOMINAL AORTOGRAM W/LOWER EXTREMITY;  Surgeon: Marty Heck, MD;  Location: [facility];  Service: Cardiovascular;  Laterality: Bilateral;   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   AMPUTATION Left 02/19/2021   Procedure: LEFT ABOVE KNEE AMPUTATION;  Surgeon: Cherre Robins, MD;  Location: [facility];  Service: Vascular;  Laterality: Left;   ANTERIOR CERVICAL DECOMP/DISCECTOMY FUSION  03/08/12   C6-7   ANTERIOR CERVICAL DECOMP/DISCECTOMY FUSION  03/08/2012   Procedure: ANTERIOR CERVICAL DECOMPRESSION/DISCECTOMY FUSION 1 LEVEL/HARDWARE REMOVAL;  Surgeon: Erline Levine, MD;  Location: [facility];  Service: Neurosurgery;  Laterality: N/A;  revison of C5-7 anterior cervical decompression with fusion with Cervical Five-Thoracic One anterior cervical decompression with fusion with interbody prothesis plating and bonegraft   [place]   BIOPSY  12/05/2020   Procedure: BIOPSY;  Surgeon: Ladene Artist, MD;  Location: [facility];  Service: Endoscopy;;   BYPASS GRAFT FEMORAL-PERONEAL Left 11/11/2016   Procedure: REDO LEFT FEMORAL-PERONEAL BYPASS WITH PROPATEN 6MM X 80CM GRAFT;  Surgeon: Angelia Mould, MD;  Location: [facility];  Service: Vascular;  Laterality: Left;   COLONOSCOPY W/ BIOPSIES AND POLYPECTOMY  08/17/2012   f/u 5 years, 4 polyps, no high grade dysplasia or  malignancy, tubular adenoma, hyperplastic polyops   COLONOSCOPY WITH PROPOFOL N/A 12/05/2020   Procedure: COLONOSCOPY WITH PROPOFOL;  Surgeon: Ladene Artist, MD;  Location: [facility]OSCOPY;  Service: Endoscopy;  Laterality: N/A;   EMBOLECTOMY Left 12/19/2019   Procedure: LEFT FEMERAL- PERONEAL THROMBECTOMY;  Surgeon: Marty Heck, MD;  Location: [facility];  Service: Vascular;  Laterality: Left;   ENDARTERECTOMY FEMORAL Right 04/24/2020   Procedure: RIGHT FEMORAL ENDARTERECTOMY;   Surgeon: Marty Heck, MD;  Location: [place];  Service: Vascular;  Laterality: Right;   ENTEROSCOPY N/A 12/11/2015   Procedure: ENTEROSCOPY;  Surgeon: Carol Ada, MD;  Location: [place];  Service: Endoscopy;  Laterality: N/A;   ENTEROSCOPY N/A 03/29/2018   Procedure: ENTEROSCOPY;  Surgeon: Carol Ada, MD;  Location: [place];  Service: Endoscopy;  Laterality: N/A;   ESOPHAGOGASTRODUODENOSCOPY  08/17/2012   normal esophagus and GEJ, diffuse gastritis with erythema- no malignancy, reactive gastropathy  with focal intestinal metaplasia   ESOPHAGOGASTRODUODENOSCOPY (EGD) WITH PROPOFOL N/A 12/05/2020   Procedure: ESOPHAGOGASTRODUODENOSCOPY (EGD) WITH PROPOFOL;  Surgeon: Ladene Artist, MD;  Location: [facility]OSCOPY;  Service: Endoscopy;  Laterality: N/A;   FEMORAL-POPLITEAL BYPASS GRAFT Left 01/06/2016   Procedure: Left  COMMON FEMORAL-BELOW KNEE POPLITEAL ARTERY Bypass using non-reversed translocated saphenous vein graft from left leg;  Surgeon: Mal Misty, MD;  Location: [place];  Service: Vascular;  Laterality: Left;   GIVENS CAPSULE STUDY N/A 11/24/2015   Procedure: GIVENS CAPSULE STUDY;  Surgeon: Juanita Craver, MD;  Location: [facility];  Service: Endoscopy;  Laterality: N/A;   HOT HEMOSTASIS N/A 03/29/2018   Procedure: HOT HEMOSTASIS (ARGON PLASMA COAGULATION/BICAP);  Surgeon: Carol Ada, MD;  Location: [place];  Service: Endoscopy;  Laterality: N/A;   INGUINAL HERNIA REPAIR  1990's   right   INTRAOPERATIVE ARTERIOGRAM Left 01/06/2016   Procedure: INTRA OPERATIVE ARTERIOGRAM LEFT LOWER LEG;  Surgeon: Mal Misty, MD;  Location: [place];  Service: Vascular;  Laterality: Left;   INTRAOPERATIVE ARTERIOGRAM Left 11/11/2016   Procedure: INTRA OPERATIVE ARTERIOGRAM LEFT LOWER EXTRIMITY;  Surgeon: Angelia Mould, MD;  Location: [place];  Service: Vascular;  Laterality: Left;   IR ANGIOGRAM FOLLOW UP STUDY  12/11/2017   IR GENERIC HISTORICAL  10/24/2016   IR ANGIOGRAM  FOLLOW UP STUDY   LOWER EXTREMITY ANGIOGRAM Bilateral 07/30/2015   Procedure: Lower Extremity Angiogram;  Surgeon: Conrad Lake Lure, MD;  Location: [facility];  Service: Cardiovascular;  Laterality: Bilateral;   [place]   "lower"   PATCH ANGIOPLASTY Left 12/19/2019   Procedure: LEFT FEMORAL -PERONEAL PATCH ANGIOPLASTY WITH XENOSURE BIOLOGIC PATCH  ;  Surgeon: Marty Heck, MD;  Location: [place];  Service: Vascular;  Laterality: Left;   PATCH ANGIOPLASTY Right 04/24/2020   Procedure: Patch Angioplasty of Right Femoral Artery using Long Xenosure Biologic Patch 1cm x 14 cm;  Surgeon: Marty Heck, MD;  Location: [place];  Service: Vascular;  Laterality: Right;   PERIPHERAL VASCULAR CATHETERIZATION N/A 07/30/2015   Procedure: Abdominal Aortogram;  Surgeon: Conrad [place] Key, MD;  Location: [facility];  Service: Cardiovascular;  Laterality: N/A;   PERIPHERAL VASCULAR CATHETERIZATION N/A 10/24/2016   Procedure: Abdominal Aortogram;  Surgeon: Angelia Mould, MD;  Location: [place] [place] [facility];  Service: Cardiovascular;  Laterality: N/A;   PERIPHERAL VASCULAR CATHETERIZATION N/A 10/24/2016   Procedure: Lower Extremity Angiography;  Surgeon: Angelia Mould, MD;  Location: [facility];  Service: Cardiovascular;  Laterality: N/A;  PERIPHERAL VASCULAR INTERVENTION Right 12/11/2017   Procedure: PERIPHERAL VASCULAR INTERVENTION;  Surgeon: Angelia Mould, MD;  Location: [facility];  Service: Cardiovascular;  Laterality: Right;   VASCULAR SURGERY  ~ 2007   Stent SFA    VEIN HARVEST Left 01/06/2016   Procedure: LEFT GREATER [place];  Surgeon: Mal Misty, MD;  Location: [facility];  Service: Vascular;  Laterality: Left;    Social History   Socioeconomic History   Marital status: Soil scientist    Spouse name: Not on file   Number of children: 3   Years of education: 14   Highest education level: Some college, no degree   Occupational History   Not on file  Tobacco Use   Smoking status: Some Days    Types: Cigars   Smokeless tobacco: Never  Vaping Use   Vaping Use: Never used  Substance and Sexual Activity   Alcohol use: Yes    Alcohol/week: 2.0 standard drinks    Types: 2 Cans of beer per week   Drug use: No   Sexual activity: Yes  Other Topics Concern   Not on file  Social History Narrative   Lives alone. Lives in an apartment. Ground floor apartment. Smoke alarms present, no grab bars.    Has a shih-tzu, named Missy.    Patient eats a variety of foods. Drinks tea, Kool-aid, coffee      Use public transportation or brother will take places.   Some food insecurity.      Social Determinants of Health   Financial Resource Strain: Not on file  Food Insecurity: Not on file  Transportation Needs: Not on file  Physical Activity: Not on file  Stress: Not on file  Social Connections: Not on file  Intimate Partner Violence: Not on file    Family History  Problem Relation Age of Onset   Cancer Mother        colon Cancer   Hyperlipidemia Mother    Diabetes Mother    Heart disease Father    Hypertension Father    Cancer Sister        Uterine   Diabetes Sister    Diabetes Brother     Current Outpatient Medications  Medication Sig Dispense Refill   atorvastatin (LIPITOR) 40 MG tablet TAKE 1 TABLET BY MOUTH EVERY DAY 90 tablet 1   benazepril (LOTENSIN) 5 MG tablet TAKE 1 TABLET BY MOUTH EVERY DAY 30 tablet 0   clopidogrel (PLAVIX) 75 MG tablet TAKE 1 TABLET BY MOUTH EVERY DAY 90 tablet 0   diclofenac Sodium (VOLTAREN) 1 % GEL Apply 2 g topically 4 (four) times daily. 50 g 1   ezetimibe (ZETIA) 10 MG tablet TAKE 1 TABLET BY MOUTH EVERY DAY 30 tablet 1   FLUoxetine (PROZAC) 10 MG capsule TAKE 1 CAPSULE EVERY DAY 90 capsule 1   gabapentin (NEURONTIN) 300 MG capsule TAKE 2 CAPSULES AT BEDTIME AS NEEDED. 90 capsule 1   metFORMIN (GLUCOPHAGE) 1000 MG tablet TAKE ONE (1) TABLET BY MOUTH TWICE  DAILY WITH MEALS 90 tablet 1   Nebivolol HCl 20 MG TABS TAKE 1 TABLET BY MOUTH EVERY DAY 30 tablet 0   pantoprazole (PROTONIX) 40 MG tablet TAKE 1 TABLET BY MOUTH DAILY AS NEEDED 90 tablet 1   traMADol (ULTRAM) 50 MG tablet TAKE 1-2 TABLETS (50-100 MG TOTAL) BY MOUTH DAILY AS NEEDED. 14 tablet 1   ferrous sulfate 325 (65 FE) MG EC tablet Take 1 tablet (325 mg  total) by mouth daily with breakfast. (Patient not taking: Reported on 01/11/2022) 90 tablet 1   oxyCODONE-acetaminophen (PERCOCET/ROXICET) 5-325 MG tablet Take 1 tablet by mouth every 6 (six) hours as needed for moderate pain. (Patient not taking: Reported on 01/11/2022) 10 tablet 0   polyethylene glycol (MIRALAX / GLYCOLAX) 17 g packet Take 17 g by mouth daily. (Patient not taking: Reported on 01/11/2022) 14 each 0   No current facility-administered medications for this visit.    Allergies  Allergen Reactions   Glipizide Other (See Comments)    REACTION IS SIDE EFFECT Severe hypoglycemia to 40s.      REVIEW OF SYSTEMS:   [X]  denotes positive finding, [ ]  denotes negative finding Cardiac  Comments:  Chest pain or chest pressure:    Shortness of breath upon exertion:    Short of breath when lying flat:    Irregular heart rhythm:        Vascular    Pain in calf, thigh, or hip brought on by ambulation:    Pain in feet at night that wakes you up from your sleep:     Blood clot in your veins:    Leg swelling:         Pulmonary    Oxygen at home:    Productive cough:     Wheezing:         Neurologic    Sudden weakness in arms or legs:     Sudden numbness in arms or legs:     Sudden onset of difficulty speaking or slurred speech:    Temporary loss of vision in one eye:     Problems with dizziness:         Gastrointestinal    Blood in stool:     Vomited blood:         Genitourinary    Burning when urinating:     Blood in urine:        Psychiatric    Major depression:         Hematologic    Bleeding problems:     Problems with blood clotting too easily:        Skin    Rashes or ulcers:        Constitutional    Fever or chills:      PHYSICAL EXAMINATION:  Vitals:   01/11/22 1545  BP: (!) 146/74  Pulse: 74  Resp: 18  Temp: (!) 97.4 F (36.3 C)  TempSrc: Temporal  SpO2: 99%  Weight: 170 lb (77.1 kg)  Height: 5\' 7"  (1.702 m)    General:  WDWN in NAD; vital signs documented above Gait: Not observed HENT: WNL, normocephalic Pulmonary: normal non-labored breathing , without Rales, rhonchi,  wheezing Cardiac: regular HR Abdomen: soft, NT, no masses Skin: without rashes Extremities: Right foot warm to touch without wounds; point tenderness left lateral AKA along the incision; there is some fullness in this area however no fluctuance or overlying wounds Musculoskeletal: no muscle wasting or atrophy  Neurologic: A&O X 3;  No focal weakness or paresthesias are detected Psychiatric:  The pt has Normal affect.    ASSESSMENT/PLAN:: 71 y.o. male here for evaluation of left AKA pain and PAD  -Tender area in the lateral AKA stump with some fullness likely scar tissue formation.  I do not feel any areas of fluctuance that would suggest infection.  Skin overlying this area is normal.  I recommended resting his AKA stump and avoiding therapy with the prosthetic  for least 2 weeks.  He should also continue massaging the area to try to break up some scar tissue.  He can use his Voltaren gel on this area as well to help with the discomfort.  I will also increase his gabapentin dosage to see if this will alleviate symptoms.  If the above conservative measures do not seem to improve his symptoms we can also check a CT without contrast to make sure there are no fluid collections or any identifiable source by imaging.   -Right ABI of 0.4.  He does not have any rest pain or nonhealing wounds of the right foot.  No indication for further work-up at this time. -Patient will follow up in 6 months to repeat ABI.   He knows to call/return office sooner with any questions or concerns   Dagoberto Ligas, PA-C [facility] [phone]  Clinic MD:   Carlis Abbott

## 2022-01-13 ENCOUNTER — Other Ambulatory Visit: Payer: Self-pay

## 2022-01-13 ENCOUNTER — Other Ambulatory Visit: Payer: Self-pay | Admitting: *Deleted

## 2022-01-13 ENCOUNTER — Ambulatory Visit: Payer: Medicare HMO

## 2022-01-13 ENCOUNTER — Ambulatory Visit: Payer: Medicare HMO | Admitting: Occupational Therapy

## 2022-01-13 DIAGNOSIS — M6281 Muscle weakness (generalized): Secondary | ICD-10-CM | POA: Diagnosis not present

## 2022-01-13 DIAGNOSIS — R2681 Unsteadiness on feet: Secondary | ICD-10-CM | POA: Diagnosis not present

## 2022-01-13 DIAGNOSIS — I779 Disorder of arteries and arterioles, unspecified: Secondary | ICD-10-CM

## 2022-01-13 DIAGNOSIS — G8929 Other chronic pain: Secondary | ICD-10-CM

## 2022-01-13 DIAGNOSIS — M25512 Pain in left shoulder: Secondary | ICD-10-CM | POA: Diagnosis not present

## 2022-01-13 DIAGNOSIS — R293 Abnormal posture: Secondary | ICD-10-CM | POA: Diagnosis not present

## 2022-01-13 DIAGNOSIS — R2689 Other abnormalities of gait and mobility: Secondary | ICD-10-CM | POA: Diagnosis not present

## 2022-01-13 NOTE — Therapy (Signed)
[facility] [address] [place], [place], [address] Phone: [phone]   Fax:  [phone]  Occupational Therapy Treatment  Patient Details  Name: [name] MRN: [id number] Date of Birth: [birth year] No data recorded  Encounter Date: 01/13/2022   OT End of Session - 01/13/22 1145     Visit Number 4    Number of Visits 10    Date for OT Re-Evaluation 02/09/22   renewal done on 01/06/22 - pt had only been seen for 2 visits prior to this   Authorization Type [employer] MCR/MCD    OT Start Time 1100    OT Stop Time 1140    OT Time Calculation (min) 40 min    Activity Tolerance Patient tolerated treatment well    Behavior During Therapy [facility] for tasks assessed/performed             Past Medical History:  Diagnosis Date   Adhesive capsulitis 03/10/2020   Anemia    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 Vertell Limber neurosurgery   Chronic lower back pain    Claudication of both lower extremities ([place]) 07/15/2015   Critical lower limb ischemia ([place]) 12/19/2019   Diabetes mellitus    takes Metformin daily   GERD (gastroesophageal reflux disease)    takes Protonix daily   History of blood transfusion    "related to low HgB" ((09/10/2015   Hyperlipidemia    takes Vytorin daily   Hypertension    takes Benazepril and Bystolic daily   PAD (peripheral artery disease) ([place])    Pneumonia    Radiculopathy, cervical region 02/11/2017   Shortness of breath dyspnea    with exertion   Tobacco user    Toe fracture, right 05/09/2011   Wears glasses     Past Surgical History:  Procedure Laterality Date   ABDOMINAL AORTOGRAM W/LOWER EXTREMITY N/A 12/11/2017   Procedure: ABDOMINAL AORTOGRAM W/LOWER EXTREMITY;  Surgeon: Angelia Mould, MD;  Location: [facility];  Service: Cardiovascular;  Laterality: N/A;    ABDOMINAL AORTOGRAM W/LOWER EXTREMITY Bilateral 04/22/2020   Procedure: ABDOMINAL AORTOGRAM W/LOWER EXTREMITY;  Surgeon: Marty Heck, MD;  Location: [place] [facility];  Service: Cardiovascular;  Laterality: Bilateral;   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   AMPUTATION Left 02/19/2021   Procedure: LEFT ABOVE KNEE AMPUTATION;  Surgeon: Cherre Robins, MD;  Location: [facility];  Service: Vascular;  Laterality: Left;   ANTERIOR CERVICAL DECOMP/DISCECTOMY FUSION  03/08/12   C6-7   ANTERIOR CERVICAL DECOMP/DISCECTOMY FUSION  03/08/2012   Procedure: ANTERIOR CERVICAL DECOMPRESSION/DISCECTOMY FUSION 1 LEVEL/HARDWARE REMOVAL;  Surgeon: Erline Levine, MD;  Location: [facility];  Service: Neurosurgery;  Laterality: N/A;  revison of C5-7 anterior cervical decompression with fusion with Cervical Five-Thoracic One anterior cervical decompression with fusion with interbody prothesis plating and bonegraft   [place]   BIOPSY  12/05/2020   Procedure: BIOPSY;  Surgeon: Ladene Artist, MD;  Location: [facility];  Service: Endoscopy;;   BYPASS GRAFT FEMORAL-PERONEAL Left 11/11/2016   Procedure: REDO LEFT FEMORAL-PERONEAL BYPASS WITH PROPATEN 6MM X 80CM GRAFT;  Surgeon: Angelia Mould, MD;  Location: [facility];  Service: Vascular;  Laterality: Left;   COLONOSCOPY W/ BIOPSIES AND POLYPECTOMY  08/17/2012   f/u 5 years, 4 polyps, no high grade dysplasia or malignancy, tubular adenoma, hyperplastic polyops  COLONOSCOPY WITH PROPOFOL N/A 12/05/2020   Procedure: COLONOSCOPY WITH PROPOFOL;  Surgeon: [place]ene Artist, MD;  Location: [facility];  Service: Endoscopy;  Laterality: N/A;   EMBOLECTOMY Left 12/19/2019   Procedure: LEFT FEMERAL- PERONEAL THROMBECTOMY;  Surgeon: Marty Heck, MD;  Location: [facility];  Service: Vascular;  Laterality: Left;   ENDARTERECTOMY FEMORAL Right 04/24/2020   Procedure: RIGHT FEMORAL ENDARTERECTOMY;  Surgeon: Marty Heck, MD;  Location: [facility];   Service: Vascular;  Laterality: Right;   ENTEROSCOPY N/A 12/11/2015   Procedure: ENTEROSCOPY;  Surgeon: Carol Ada, MD;  Location: [facility];  Service: Endoscopy;  Laterality: N/A;   ENTEROSCOPY N/A 03/29/2018   Procedure: ENTEROSCOPY;  Surgeon: Carol Ada, MD;  Location: [facility];  Service: Endoscopy;  Laterality: N/A;   ESOPHAGOGASTRODUODENOSCOPY  08/17/2012   normal esophagus and GEJ, diffuse gastritis with erythema- no malignancy, reactive gastropathy  with focal intestinal metaplasia   ESOPHAGOGASTRODUODENOSCOPY (EGD) WITH PROPOFOL N/A 12/05/2020   Procedure: ESOPHAGOGASTRODUODENOSCOPY (EGD) WITH PROPOFOL;  Surgeon: [place] Artist, MD;  Location: [facility] ENDOSCOPY;  Service: Endoscopy;  Laterality: N/A;   FEMORAL-POPLITEAL BYPASS GRAFT Left 01/06/2016   Procedure: Left  COMMON FEMORAL-BELOW KNEE POPLITEAL ARTERY Bypass using non-reversed translocated saphenous vein graft from left leg;  Surgeon: Mal Misty, MD;  Location: [facility];  Service: Vascular;  Laterality: Left;   GIVENS CAPSULE STUDY N/A 11/24/2015   Procedure: GIVENS CAPSULE STUDY;  Surgeon: Juanita Craver, MD;  Location: [facility];  Service: Endoscopy;  Laterality: N/A;   HOT HEMOSTASIS N/A 03/29/2018   Procedure: HOT HEMOSTASIS (ARGON PLASMA COAGULATION/BICAP);  Surgeon: Carol Ada, MD;  Location: [place];  Service: Endoscopy;  Laterality: N/A;   INGUINAL HERNIA REPAIR  1990's   right   INTRAOPERATIVE ARTERIOGRAM Left 01/06/2016   Procedure: INTRA OPERATIVE ARTERIOGRAM LEFT LOWER LEG;  Surgeon: Mal Misty, MD;  Location: [facility];  Service: Vascular;  Laterality: Left;   INTRAOPERATIVE ARTERIOGRAM Left 11/11/2016   Procedure: INTRA OPERATIVE ARTERIOGRAM LEFT LOWER EXTRIMITY;  Surgeon: Angelia Mould, MD;  Location: [facility];  Service: Vascular;  Laterality: Left;   IR ANGIOGRAM FOLLOW UP STUDY  12/11/2017   IR GENERIC HISTORICAL  10/24/2016   IR ANGIOGRAM FOLLOW UP STUDY   LOWER EXTREMITY ANGIOGRAM Bilateral  07/30/2015   Procedure: Lower Extremity Angiogram;  Surgeon: Conrad Hobucken, MD;  Location: [facility];  Service: Cardiovascular;  Laterality: Bilateral;   [place]   "lower"   PATCH ANGIOPLASTY Left 12/19/2019   Procedure: LEFT FEMORAL -PERONEAL PATCH ANGIOPLASTY WITH XENOSURE BIOLOGIC PATCH  ;  Surgeon: Marty Heck, MD;  Location: [facility];  Service: Vascular;  Laterality: Left;   PATCH ANGIOPLASTY Right 04/24/2020   Procedure: Patch Angioplasty of Right Femoral Artery using Long Xenosure Biologic Patch 1cm x 14 cm;  Surgeon: Marty Heck, MD;  Location: [facility];  Service: Vascular;  Laterality: Right;   PERIPHERAL VASCULAR CATHETERIZATION N/A 07/30/2015   Procedure: Abdominal Aortogram;  Surgeon: Conrad Farnham, MD;  Location: [facility] [facility];  Service: Cardiovascular;  Laterality: N/A;   PERIPHERAL VASCULAR CATHETERIZATION N/A 10/24/2016   Procedure: Abdominal Aortogram;  Surgeon: Angelia Mould, MD;  Location: [facility];  Service: Cardiovascular;  Laterality: N/A;   PERIPHERAL VASCULAR CATHETERIZATION N/A 10/24/2016   Procedure: Lower Extremity Angiography;  Surgeon: Angelia Mould, MD;  Location: [place] [facility];  Service: Cardiovascular;  Laterality: N/A;   PERIPHERAL VASCULAR INTERVENTION Right 12/11/2017  Procedure: PERIPHERAL VASCULAR INTERVENTION;  Surgeon: Angelia Mould, MD;  Location: [place] [facility];  Service: Cardiovascular;  Laterality: Right;   VASCULAR SURGERY  ~ 2007   Stent SFA    VEIN HARVEST Left 01/06/2016   Procedure: LEFT GREATER SAPPHENOUS VEIN HARVEST;  Surgeon: Mal Misty, MD;  Location: [facility];  Service: Vascular;  Laterality: Left;    There were no vitals filed for this visit.   Subjective Assessment - 01/13/22 1103     Subjective  The kinesiotape seemed to help my shoulder    Pertinent History chronic Lt shoulder pain. 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 PMH: OA, DM, GERD, HLD, HTN, PAD, ACDF  2013.    Patient Stated Goals Increase shoulder motion and decrease pain    Currently in Pain? Yes    Pain Score 7     Pain Location Shoulder    Pain Orientation Left    Pain Descriptors / Indicators Aching;Burning    Pain Type Acute pain    Pain Onset More than a month ago    Pain Frequency Constant    Aggravating Factors  certain movements    Pain Relieving Factors ice, heat, tape, voltaren gel             Supine: Ultrasound x 10 min, continuous, 0.8 wts/cm2, 3 Mhz Lt shoulder (posteriorly, laterally at middle deltoid, and anteriorly near bicep insertion)  Supine: worked on scapula protraction and retraction with sh flexed at 90* - pt required cues to not hold breath or hike shoulder  Pt instructed in TENS device for home use and how to set up, and change parameters (however pt only had 1 lead wire for 2 electrodes vs. 2 lead wires for 4 electrodes) therefore today placed electrodes anterior and posterior of shoulder. Pt did best w/ modulation mode (recommended not using burst mode)   Pt kept TENS on Lt shoulder for approx 5 minutes once desired setting established.                       OT Education - 01/13/22 1153     Education Details TENS device set up and application    Person(s) Educated Patient    Methods Explanation;Demonstration;Verbal cues    Comprehension Verbalized understanding;Returned demonstration;Verbal cues required              OT Short Term Goals - 12/08/21 0852       OT SHORT TERM GOAL #1   Title STG's = LTG's               OT Long Term Goals - 01/13/22 1146       OT LONG TERM GOAL #1   Title Independent with HEP for Lt shoulder    Time 4    Period Weeks    Status On-going      OT LONG TERM GOAL #2   Title Pt report greater ease and less pain donning overhead shirt and jacket    Time 4    Period Weeks    Status On-going      OT LONG TERM GOAL #3   Title Pt to increase shoulder flexion to 110* or greater to  retrieve light objects from overhead shelf    Baseline 100*    Time 4    Period Weeks    Status On-going      OT LONG TERM GOAL #4   Title Pt to id pain management strategies for Lt shoulder (  modalities, positioning, etc)    Time 4    Period Weeks    Status New                   Plan - 01/13/22 1146     Clinical Impression Statement Pt reports taping did help w/ shoulder pain. Pt brought in new TENS device to try today. Progressing slowly towards goals    OT Occupational Profile and History Problem Focused Assessment - Including review of records relating to presenting problem    Occupational performance deficits (Please refer to evaluation for details): ADL's;IADL's    Body Structure / Function / Physical Skills Strength;Pain;UE functional use;ROM;IADL;Body mechanics;Mobility    Rehab Potential Fair   chronic in nature, partial tear   Clinical Decision Making Limited treatment options, no task modification necessary    Comorbidities Affecting Occupational Performance: Presence of comorbidities impacting occupational performance    Comorbidities impacting occupational performance description: Lt AKA    Modification or Assistance to Complete Evaluation  No modification of tasks or assist necessary to complete eval    OT Frequency 2x / week    OT Duration 4 weeks    OT Treatment/Interventions Self-care/ADL training;Moist Heat;Therapeutic activities;Ultrasound;Therapeutic exercise;Coping strategies training;Neuromuscular education;Cryotherapy;Passive range of motion;Functional Mobility Training;Patient/family education;Manual Therapy;Electrical Stimulation    Plan continue ultrasound to Lt shoulder, review TENS device prn, continue scapula protraction/retraction at 90* sh flex supine, reaching in flexion seated    Consulted and Agree with Plan of Care Patient             Patient will benefit from skilled therapeutic intervention in order to improve the following deficits  and impairments:   Body Structure / Function / Physical Skills: Strength, Pain, UE functional use, ROM, IADL, Body mechanics, Mobility       Visit Diagnosis: Chronic left shoulder pain  Muscle weakness (generalized)    Problem List Patient Active Problem List   Diagnosis Date Noted   Healthcare maintenance 08/17/2021   Loose stools 06/07/2021   Abnormal weight gain 03/30/2021   Anal fissure 03/30/2021   Fatty liver 03/30/2021   Flatulence, eructation and gas pain 03/30/2021   Hematochezia 03/30/2021   Imaging of gastrointestinal tract abnormal 03/30/2021   Personal history of colonic polyps 03/30/2021   Rectal pain 03/30/2021   Constipation 03/30/2021   Drug-induced constipation 03/30/2021   Dysphagia 03/30/2021   Colon cancer screening 03/30/2021   Right upper quadrant pain 03/30/2021   Gastroesophageal reflux disease 03/30/2021   Limb ischemia 02/19/2021   S/P above knee amputation, left ([place]) 02/19/2021   Symptomatic anemia 12/04/2020   PAOD (peripheral arterial occlusive disease) ([place]) 04/24/2020   Adhesive capsulitis 03/10/2020   Lung nodule < 6cm on CT 01/28/2019   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 [place], [place], [address] Phone: [phone]   Fax:  [phone]  Name: [name] MRN: [id number] Date of Birth: 07/28/1951

## 2022-01-13 NOTE — Therapy (Signed)
[facility] [address] [place], [place], [address] Phone: [phone]   Fax:  [phone]  Physical Therapy Treatment- arrived no charge  Patient Details  Name: [name] MRN: [id number] Date of Birth: 03-30-1951 Referring Provider (PT): Risa Grill, [place]   Encounter Date: 01/13/2022   PT End of Session - 01/13/22 1202     Visit Number 5    Number of Visits 25    Date for PT Re-Evaluation 02/18/22    Authorization Type [employer]na medicare with medicaid secondary, 10th visit progress note    PT Start Time 1151    PT Stop Time 1200    PT Time Calculation (min) 9 min    Equipment Utilized During Treatment Gait belt    Activity Tolerance Patient tolerated treatment well    Behavior During Therapy [facility] for tasks assessed/performed             Past Medical History:  Diagnosis Date   Adhesive capsulitis 03/10/2020   Anemia    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 Vertell Limber neurosurgery   Chronic lower back pain    Claudication of both lower extremities ([place]) 07/15/2015   Critical lower limb ischemia ([place]) 12/19/2019   Diabetes mellitus    takes Metformin daily   GERD (gastroesophageal reflux disease)    takes Protonix daily   History of blood transfusion    "related to low HgB" ((09/10/2015   Hyperlipidemia    takes Vytorin daily   Hypertension    takes Benazepril and Bystolic daily   PAD (peripheral artery disease) ([place])    Pneumonia    Radiculopathy, cervical region 02/11/2017   Shortness of breath dyspnea    with exertion   Tobacco user    Toe fracture, right 05/09/2011   Wears glasses     Past Surgical History:  Procedure Laterality Date   ABDOMINAL AORTOGRAM W/LOWER EXTREMITY N/A 12/11/2017   Procedure: ABDOMINAL AORTOGRAM W/LOWER EXTREMITY;  Surgeon: Angelia Mould, MD;   Location: [place] [facility];  Service: Cardiovascular;  Laterality: N/A;   ABDOMINAL AORTOGRAM W/LOWER EXTREMITY Bilateral 04/22/2020   Procedure: ABDOMINAL AORTOGRAM W/LOWER EXTREMITY;  Surgeon: Marty Heck, MD;  Location: [place] [facility];  Service: Cardiovascular;  Laterality: Bilateral;   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   AMPUTATION Left 02/19/2021   Procedure: LEFT ABOVE KNEE AMPUTATION;  Surgeon: Cherre Robins, MD;  Location: [facility];  Service: Vascular;  Laterality: Left;   ANTERIOR CERVICAL DECOMP/DISCECTOMY FUSION  03/08/12   C6-7   ANTERIOR CERVICAL DECOMP/DISCECTOMY FUSION  03/08/2012   Procedure: ANTERIOR CERVICAL DECOMPRESSION/DISCECTOMY FUSION 1 LEVEL/HARDWARE REMOVAL;  Surgeon: Erline Levine, MD;  Location: [place] [facility]URO [facility];  Service: Neurosurgery;  Laterality: N/A;  revison of C5-7 anterior cervical decompression with fusion with Cervical Five-Thoracic One anterior cervical decompression with fusion with interbody prothesis plating and bonegraft   [place]   BIOPSY  12/05/2020   Procedure: BIOPSY;  Surgeon: Ladene Artist, MD;  Location: [facility] ENDOSCOPY;  Service: Endoscopy;;   BYPASS GRAFT FEMORAL-PERONEAL Left 11/11/2016   Procedure: REDO LEFT FEMORAL-PERONEAL BYPASS WITH PROPATEN 6MM X 80CM GRAFT;  Surgeon: Angelia Mould, MD;  Location: [facility];  Service: Vascular;  Laterality: Left;   COLONOSCOPY W/ BIOPSIES AND POLYPECTOMY  08/17/2012   f/u 5 years, 4 polyps, no high grade dysplasia or  malignancy, tubular adenoma, hyperplastic polyops   COLONOSCOPY WITH PROPOFOL N/A 12/05/2020   Procedure: COLONOSCOPY WITH PROPOFOL;  Surgeon: Ladene Artist, MD;  Location: [facility] ENDOSCOPY;  Service: Endoscopy;  Laterality: N/A;   EMBOLECTOMY Left 12/19/2019   Procedure: LEFT FEMERAL- PERONEAL THROMBECTOMY;  Surgeon: Marty Heck, MD;  Location: [place];  Service: Vascular;  Laterality: Left;   ENDARTERECTOMY FEMORAL Right 04/24/2020   Procedure: RIGHT FEMORAL  ENDARTERECTOMY;  Surgeon: Marty Heck, MD;  Location: [place];  Service: Vascular;  Laterality: Right;   ENTEROSCOPY N/A 12/11/2015   Procedure: ENTEROSCOPY;  Surgeon: Carol Ada, MD;  Location: [place];  Service: Endoscopy;  Laterality: N/A;   ENTEROSCOPY N/A 03/29/2018   Procedure: ENTEROSCOPY;  Surgeon: Carol Ada, MD;  Location: [place];  Service: Endoscopy;  Laterality: N/A;   ESOPHAGOGASTRODUODENOSCOPY  08/17/2012   normal esophagus and GEJ, diffuse gastritis with erythema- no malignancy, reactive gastropathy  with focal intestinal metaplasia   ESOPHAGOGASTRODUODENOSCOPY (EGD) WITH PROPOFOL N/A 12/05/2020   Procedure: ESOPHAGOGASTRODUODENOSCOPY (EGD) WITH PROPOFOL;  Surgeon: Ladene Artist, MD;  Location: [facility] ENDOSCOPY;  Service: Endoscopy;  Laterality: N/A;   FEMORAL-POPLITEAL BYPASS GRAFT Left 01/06/2016   Procedure: Left  COMMON FEMORAL-BELOW KNEE POPLITEAL ARTERY Bypass using non-reversed translocated saphenous vein graft from left leg;  Surgeon: Mal Misty, MD;  Location: [facility];  Service: Vascular;  Laterality: Left;   GIVENS CAPSULE STUDY N/A 11/24/2015   Procedure: GIVENS CAPSULE STUDY;  Surgeon: Juanita Craver, MD;  Location: [place];  Service: Endoscopy;  Laterality: N/A;   HOT HEMOSTASIS N/A 03/29/2018   Procedure: HOT HEMOSTASIS (ARGON PLASMA COAGULATION/BICAP);  Surgeon: Carol Ada, MD;  Location: [place];  Service: Endoscopy;  Laterality: N/A;   INGUINAL HERNIA REPAIR  1990's   right   INTRAOPERATIVE ARTERIOGRAM Left 01/06/2016   Procedure: INTRA OPERATIVE ARTERIOGRAM LEFT LOWER LEG;  Surgeon: Mal Misty, MD;  Location: [place];  Service: Vascular;  Laterality: Left;   INTRAOPERATIVE ARTERIOGRAM Left 11/11/2016   Procedure: INTRA OPERATIVE ARTERIOGRAM LEFT LOWER EXTRIMITY;  Surgeon: Angelia Mould, MD;  Location: [place];  Service: Vascular;  Laterality: Left;   IR ANGIOGRAM FOLLOW UP STUDY  12/11/2017   IR GENERIC HISTORICAL  10/24/2016    IR ANGIOGRAM FOLLOW UP STUDY   LOWER EXTREMITY ANGIOGRAM Bilateral 07/30/2015   Procedure: Lower Extremity Angiogram;  Surgeon: Conrad [place]ne [place], MD;  Location: [place] [facility];  Service: Cardiovascular;  Laterality: Bilateral;   [place]   "lower"   PATCH ANGIOPLASTY Left 12/19/2019   Procedure: LEFT FEMORAL -PERONEAL PATCH ANGIOPLASTY WITH XENOSURE BIOLOGIC PATCH  ;  Surgeon: Marty Heck, MD;  Location: [place];  Service: Vascular;  Laterality: Left;   PATCH ANGIOPLASTY Right 04/24/2020   Procedure: Patch Angioplasty of Right Femoral Artery using Long Xenosure Biologic Patch 1cm x 14 cm;  Surgeon: Marty Heck, MD;  Location: [place];  Service: Vascular;  Laterality: Right;   PERIPHERAL VASCULAR CATHETERIZATION N/A 07/30/2015   Procedure: Abdominal Aortogram;  Surgeon: Conrad Brookneal, MD;  Location: [place] [facility];  Service: Cardiovascular;  Laterality: N/A;   PERIPHERAL VASCULAR CATHETERIZATION N/A 10/24/2016   Procedure: Abdominal Aortogram;  Surgeon: Angelia Mould, MD;  Location: [place] [facility];  Service: Cardiovascular;  Laterality: N/A;   PERIPHERAL VASCULAR CATHETERIZATION N/A 10/24/2016   Procedure: Lower Extremity Angiography;  Surgeon: Angelia Mould, MD;  Location: [place] [facility];  Service: Cardiovascular;  Laterality: N/A;  PERIPHERAL VASCULAR INTERVENTION Right 12/11/2017   Procedure: PERIPHERAL VASCULAR INTERVENTION;  Surgeon: Angelia Mould, MD;  Location: [employer] CV LAB;  Service: Cardiovascular;  Laterality: Right;   VASCULAR SURGERY  ~ 2007   Stent SFA    VEIN HARVEST Left 01/06/2016   Procedure: LEFT GREATER SAPPHENOUS VEIN HARVEST;  Surgeon: Mal Misty, MD;  Location: [place];  Service: Vascular;  Laterality: Left;    There were no vitals filed for this visit.   Subjective Assessment - 01/13/22 1206     Subjective Pt saw vascular doctor on 01/11/22 about his pain in residual limb. They did not see any  signs of infection but advised pt not to wear prosthesis in therapy for 2 weeks to rest his limb. Also want him to massage area to try to break up scar tissue. Will think about CT without contrast to further assess if pain continues. Pt did not have prosthesis with him but is wearing liner. He sees prosthetist end of January as well.    Patient Stated Goals Pt wants to get on his leg to be able to walk wihtout walker.    Pain Onset More than a month ago    Pain Onset More than a month ago                                        PT Education - 01/13/22 1208     Education Details Discussed holding PT next 2 weeks and then PT will call pt to follow-up. Will plan to return 02/01/22. Advised to continue to work on skin tolerance with wearing liner 3 hours 2x/day and building up to 4 hours 2x/day next week if doing well with the 6 hours. Pt in agreement    Person(s) Educated Patient    Methods Explanation    Comprehension Verbalized understanding              PT Short Term Goals - 01/06/22 1910       PT SHORT TERM GOAL #1   Title Pt will be able to tolerate wearing prosthesis 8 hours/day for improved function.    Baseline currently 1-3 hours/day    Time 4    Status On-going    Target Date 01/25/22      PT SHORT TERM GOAL #2   Title Pt will be independent with prosthetic management.    Baseline 01/06/22 Pt still needing assistance for sock and wear time adjustment    Time 4    Period Weeks    Status On-going    Target Date 01/25/22      PT SHORT TERM GOAL #3   Title Pt will increase Berg score from 21 to >30/56 for improved balance and decreased fall risk.    Baseline 12/02/21 21/56.    Time 4    Period Weeks    Status Revised    Target Date 01/25/22      PT SHORT TERM GOAL #4   Title Pt will ambulate 200' with RW supervision for improved household ambulation.    Baseline 01/06/22 60' with RW CGA    Time 4    Period Weeks    Status On-going     Target Date 01/25/22               PT Long Term Goals - 12/02/21 1416       PT LONG  TERM GOAL #1   Title Pt will be independent with progressive HEP to continue strength and balance gains on own. (LTGs due 02/18/22)    Time 12    Period Weeks    Status New    Target Date 02/18/22      PT LONG TERM GOAL #2   Title Pt will increase gait speed to >0.78m/s for improved household mobility.    Baseline 11/26/21 0.50m/s    Time 12    Period Weeks    Status New    Target Date 02/18/22      PT LONG TERM GOAL #3   Title Pt will increase Berg Balance to >40/56 for improved balance and decreased fall risk.    Baseline 12/02/21 21/56    Time 12    Period Weeks    Status Revised    Target Date 02/18/22      PT LONG TERM GOAL #4   Title Pt will ambulate >400' on varied level surfaces mod I with RW for improved short community distances.    Time 12    Period Weeks    Status New    Target Date 02/18/22      PT LONG TERM GOAL #5   Title Pt will ambulate up/down 4 steps with rails mod I for improved community access.    Time 12    Period Weeks    Status New    Target Date 02/18/22      PT LONG TERM GOAL #6   Title Pt will ambulate up/down ramp and curb with RW mod I for improved community access.    Time 12    Period Weeks    Status New    Target Date 02/18/22                   Plan - 01/13/22 1209     Clinical Impression Statement Therapy witheld due to request from vascular doctor to not wear prosthesis with therapy next 2 weeks as they are trying to figure out what is causing lateral distal femur pain on residual limb.    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 Stanford Breed. PMH includes extensive vascular surgery history (L femoral to peroneal bypass 12/2015; R EIA stent 2018; thrombectomy of L femoral to peroneal bypass and L peroneal artery 12/20; R femoral endarterectomy, profundoplasty with bovine  pericardial patch on 03/2020), OA, DM, GERD, HLD, HTN, PAD, ACDF 2013. Chronic left shoulder pain    Examination-Activity Limitations Locomotion Level;Transfers;Stairs;Stand;Squat    Examination-Participation Restrictions Cleaning;Community Activity;Yard Work;Meal Prep    Stability/Clinical Decision Making Evolving/Moderate complexity    Rehab Potential Good    PT Frequency 2x / week   plus eval   PT Duration 12 weeks    PT Treatment/Interventions ADLs/Self Care Home Management;DME Instruction;Cryotherapy;Moist Heat;Gait training;Stair training;Functional mobility training;Therapeutic activities;Therapeutic exercise;Balance training;Neuromuscular re-education;Manual techniques;Prosthetic Training;Passive range of motion;Vestibular;Patient/family education    PT Next Visit Plan How is pain doing? Did he have CT? Is he wearing liner 4 hours 2x/day? What changes if any did Bobby make. Check Berg STG. Continue HEP at counter with focus on increasing weight shift over prosthesis, gait training with RW. Prosthetic education. . He has been limited by pain.    Consulted and Agree with Plan of Care Patient             Patient will benefit from skilled therapeutic intervention in order to improve the  following deficits and impairments:  Abnormal gait, Decreased mobility, Decreased strength, Postural dysfunction, Prosthetic Dependency, Decreased range of motion, Decreased activity tolerance, Decreased balance, Decreased knowledge of use of DME, Decreased endurance  Visit Diagnosis: Muscle weakness (generalized)     Problem List Patient Active Problem List   Diagnosis Date Noted   Healthcare maintenance 08/17/2021   Loose stools 06/07/2021   Abnormal weight gain 03/30/2021   Anal fissure 03/30/2021   Fatty liver 03/30/2021   Flatulence, eructation and gas pain 03/30/2021   Hematochezia 03/30/2021   Imaging of gastrointestinal tract abnormal 03/30/2021   Personal history of colonic polyps  03/30/2021   Rectal pain 03/30/2021   Constipation 03/30/2021   Drug-induced constipation 03/30/2021   Dysphagia 03/30/2021   Colon cancer screening 03/30/2021   Right upper quadrant pain 03/30/2021   Gastroesophageal reflux disease 03/30/2021   Limb ischemia 02/19/2021   S/P above knee amputation, left ([place]) 02/19/2021   Symptomatic anemia 12/04/2020   PAOD (peripheral arterial occlusive disease) ([place]) 04/24/2020   Adhesive capsulitis 03/10/2020   Lung nodule < 6cm on CT 01/28/2019   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 [place], [place], [address] Phone: [phone]   Fax:  [phone]  Name: [name] MRN: [id number] Date of Birth: 11-29-1951

## 2022-01-19 ENCOUNTER — Other Ambulatory Visit: Payer: Self-pay

## 2022-01-19 ENCOUNTER — Telehealth: Payer: Self-pay

## 2022-01-19 ENCOUNTER — Ambulatory Visit: Payer: Medicare HMO | Admitting: Rehabilitation

## 2022-01-19 ENCOUNTER — Ambulatory Visit: Payer: Medicare HMO | Admitting: Occupational Therapy

## 2022-01-19 DIAGNOSIS — R293 Abnormal posture: Secondary | ICD-10-CM | POA: Diagnosis not present

## 2022-01-19 DIAGNOSIS — M6281 Muscle weakness (generalized): Secondary | ICD-10-CM

## 2022-01-19 DIAGNOSIS — R2681 Unsteadiness on feet: Secondary | ICD-10-CM | POA: Diagnosis not present

## 2022-01-19 DIAGNOSIS — R2689 Other abnormalities of gait and mobility: Secondary | ICD-10-CM | POA: Diagnosis not present

## 2022-01-19 DIAGNOSIS — G8929 Other chronic pain: Secondary | ICD-10-CM

## 2022-01-19 DIAGNOSIS — M25512 Pain in left shoulder: Secondary | ICD-10-CM | POA: Diagnosis not present

## 2022-01-19 NOTE — Telephone Encounter (Signed)
Hello Dr. Erlinda Hong,   I have been working with Mr. [name] on Lt shoulder pain. I am a little perplexed because he reports joint pain worse w/ cold/rainy weather, and constant even at rest, which makes me think arthritis; however he also has worsening pain with active shoulder flex and abduction past midrange (PROM approx 75-90% w/ no increase in pain) w/ suspicion of rotator cuff involvement. He was scheduled for an MRI to shoulder in the past however costs prohibited him from proceeding w/ MRI as insurance would not cover it (per pt report). He also reports pain today along pects (point tender), at middle deltoid insertion, and even posterior shoulder. I'm not sure what's going on and if O.T. is really helping. I am taping his shoulder which helps w/ pain, also educated pt in TENS unit, ultrasound to shoulder, and HEP. He reports modalities and taping are helping but still reports pain 7/10. HEP does not increase pain and is preventing frozen shoulder. However, shoulder does not seem to be getting better. I really feel an MRI would be beneficial but he won't get due to costs. I recommended he return to you for further evaluation. I plan on seeing him through mid February but do not anticipate much improvement.   Thanks,  [employer]

## 2022-01-19 NOTE — Telephone Encounter (Signed)
Thank you Kelly

## 2022-01-19 NOTE — Therapy (Signed)
[facility] [address] [place], [place], [address] Phone: [phone]   Fax:  [phone]  Occupational Therapy Treatment  Patient Details  Name: [name] MRN: [id number] Date of Birth: 1951-05-18 No data recorded  Encounter Date: 01/19/2022   OT End of Session - 01/19/22 1232     Visit Number 5    Number of Visits 10    Date for OT Re-Evaluation 02/09/22   renewal done on 01/06/22 - pt had only been seen for 2 visits prior to this   Authorization Type [employer] MCR/MCD    OT Start Time 1005    OT Stop Time 1100    OT Time Calculation (min) 55 min    Activity Tolerance Patient tolerated treatment well    Behavior During Therapy [facility] for tasks assessed/performed             Past Medical History:  Diagnosis Date   Adhesive capsulitis 03/10/2020   Anemia    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 Vertell Limber neurosurgery   Chronic lower back pain    Claudication of both lower extremities ([place]) 07/15/2015   Critical lower limb ischemia ([place]) 12/19/2019   Diabetes mellitus    takes Metformin daily   GERD (gastroesophageal reflux disease)    takes Protonix daily   History of blood transfusion    "related to low HgB" ((09/10/2015   Hyperlipidemia    takes Vytorin daily   Hypertension    takes Benazepril and Bystolic daily   PAD (peripheral artery disease) ([place])    Pneumonia    Radiculopathy, cervical region 02/11/2017   Shortness of breath dyspnea    with exertion   Tobacco user    Toe fracture, right 05/09/2011   Wears glasses     Past Surgical History:  Procedure Laterality Date   ABDOMINAL AORTOGRAM W/LOWER EXTREMITY N/A 12/11/2017   Procedure: ABDOMINAL AORTOGRAM W/LOWER EXTREMITY;  Surgeon: Angelia Mould, MD;  Location: [place] [facility];  Service: Cardiovascular;  Laterality: N/A;    ABDOMINAL AORTOGRAM W/LOWER EXTREMITY Bilateral 04/22/2020   Procedure: ABDOMINAL AORTOGRAM W/LOWER EXTREMITY;  Surgeon: Marty Heck, MD;  Location: [place] [facility];  Service: Cardiovascular;  Laterality: Bilateral;   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   AMPUTATION Left 02/19/2021   Procedure: LEFT ABOVE KNEE AMPUTATION;  Surgeon: Cherre Robins, MD;  Location: [place] [facility];  Service: Vascular;  Laterality: Left;   ANTERIOR CERVICAL DECOMP/DISCECTOMY FUSION  03/08/12   C6-7   ANTERIOR CERVICAL DECOMP/DISCECTOMY FUSION  03/08/2012   Procedure: ANTERIOR CERVICAL DECOMPRESSION/DISCECTOMY FUSION 1 LEVEL/HARDWARE REMOVAL;  Surgeon: Erline Levine, MD;  Location: [facility];  Service: Neurosurgery;  Laterality: N/A;  revison of C5-7 anterior cervical decompression with fusion with Cervical Five-Thoracic One anterior cervical decompression with fusion with interbody prothesis plating and bonegraft   [place]   BIOPSY  12/05/2020   Procedure: BIOPSY;  Surgeon: Ladene Artist, MD;  Location: [facility] ENDOSCOPY;  Service: Endoscopy;;   BYPASS GRAFT FEMORAL-PERONEAL Left 11/11/2016   Procedure: REDO LEFT FEMORAL-PERONEAL BYPASS WITH PROPATEN 6MM X 80CM GRAFT;  Surgeon: Angelia Mould, MD;  Location: [place];  Service: Vascular;  Laterality: Left;   COLONOSCOPY W/ BIOPSIES AND POLYPECTOMY  08/17/2012   f/u 5 years, 4 polyps, no high grade dysplasia or malignancy, tubular adenoma, hyperplastic polyops  COLONOSCOPY WITH PROPOFOL N/A 12/05/2020   Procedure: COLONOSCOPY WITH PROPOFOL;  Surgeon: Ladene Artist, MD;  Location: [facility];  Service: Endoscopy;  Laterality: N/A;   EMBOLECTOMY Left 12/19/2019   Procedure: LEFT FEMERAL- PERONEAL THROMBECTOMY;  Surgeon: Marty Heck, MD;  Location: [place];  Service: Vascular;  Laterality: Left;   ENDARTERECTOMY FEMORAL Right 04/24/2020   Procedure: RIGHT FEMORAL ENDARTERECTOMY;  Surgeon: Marty Heck, MD;  Location: [place];   Service: Vascular;  Laterality: Right;   ENTEROSCOPY N/A 12/11/2015   Procedure: ENTEROSCOPY;  Surgeon: Carol Ada, MD;  Location: [place];  Service: Endoscopy;  Laterality: N/A;   ENTEROSCOPY N/A 03/29/2018   Procedure: ENTEROSCOPY;  Surgeon: Carol Ada, MD;  Location: [facility];  Service: Endoscopy;  Laterality: N/A;   ESOPHAGOGASTRODUODENOSCOPY  08/17/2012   normal esophagus and GEJ, diffuse gastritis with erythema- no malignancy, reactive gastropathy  with focal intestinal metaplasia   ESOPHAGOGASTRODUODENOSCOPY (EGD) WITH PROPOFOL N/A 12/05/2020   Procedure: ESOPHAGOGASTRODUODENOSCOPY (EGD) WITH PROPOFOL;  Surgeon: Ladene Artist, MD;  Location: [facility] ENDOSCOPY;  Service: Endoscopy;  Laterality: N/A;   FEMORAL-POPLITEAL BYPASS GRAFT Left 01/06/2016   Procedure: Left  COMMON FEMORAL-BELOW KNEE POPLITEAL ARTERY Bypass using non-reversed translocated saphenous vein graft from left leg;  Surgeon: Mal Misty, MD;  Location: [facility];  Service: Vascular;  Laterality: Left;   GIVENS CAPSULE STUDY N/A 11/24/2015   Procedure: GIVENS CAPSULE STUDY;  Surgeon: Juanita Craver, MD;  Location: [place];  Service: Endoscopy;  Laterality: N/A;   HOT HEMOSTASIS N/A 03/29/2018   Procedure: HOT HEMOSTASIS (ARGON PLASMA COAGULATION/BICAP);  Surgeon: Carol Ada, MD;  Location: [place];  Service: Endoscopy;  Laterality: N/A;   INGUINAL HERNIA REPAIR  1990's   right   INTRAOPERATIVE ARTERIOGRAM Left 01/06/2016   Procedure: INTRA OPERATIVE ARTERIOGRAM LEFT LOWER LEG;  Surgeon: Mal Misty, MD;  Location: [place];  Service: Vascular;  Laterality: Left;   INTRAOPERATIVE ARTERIOGRAM Left 11/11/2016   Procedure: INTRA OPERATIVE ARTERIOGRAM LEFT LOWER EXTRIMITY;  Surgeon: Angelia Mould, MD;  Location: [place];  Service: Vascular;  Laterality: Left;   IR ANGIOGRAM FOLLOW UP STUDY  12/11/2017   IR GENERIC HISTORICAL  10/24/2016   IR ANGIOGRAM FOLLOW UP STUDY   LOWER EXTREMITY ANGIOGRAM Bilateral  07/30/2015   Procedure: Lower Extremity Angiogram;  Surgeon: Conrad Hobucken, MD;  Location: [place] [facility];  Service: Cardiovascular;  Laterality: Bilateral;   [place]   "lower"   PATCH ANGIOPLASTY Left 12/19/2019   Procedure: LEFT FEMORAL -PERONEAL PATCH ANGIOPLASTY WITH XENOSURE BIOLOGIC PATCH  ;  Surgeon: Marty Heck, MD;  Location: [facility];  Service: Vascular;  Laterality: Left;   PATCH ANGIOPLASTY Right 04/24/2020   Procedure: Patch Angioplasty of Right Femoral Artery using Long Xenosure Biologic Patch 1cm x 14 cm;  Surgeon: Marty Heck, MD;  Location: [place];  Service: Vascular;  Laterality: Right;   PERIPHERAL VASCULAR CATHETERIZATION N/A 07/30/2015   Procedure: Abdominal Aortogram;  Surgeon: Conrad Farnham, MD;  Location: [place] [facility];  Service: Cardiovascular;  Laterality: N/A;   PERIPHERAL VASCULAR CATHETERIZATION N/A 10/24/2016   Procedure: Abdominal Aortogram;  Surgeon: Angelia Mould, MD;  Location: [place] [facility];  Service: Cardiovascular;  Laterality: N/A;   PERIPHERAL VASCULAR CATHETERIZATION N/A 10/24/2016   Procedure: Lower Extremity Angiography;  Surgeon: Angelia Mould, MD;  Location: [place] [facility];  Service: Cardiovascular;  Laterality: N/A;   PERIPHERAL VASCULAR INTERVENTION Right 12/11/2017  Procedure: PERIPHERAL VASCULAR INTERVENTION;  Surgeon: Angelia Mould, MD;  Location: [place] [facility];  Service: Cardiovascular;  Laterality: Right;   VASCULAR SURGERY  ~ 2007   Stent SFA    VEIN HARVEST Left 01/06/2016   Procedure: LEFT GREATER SAPPHENOUS VEIN HARVEST;  Surgeon: Mal Misty, MD;  Location: [place];  Service: Vascular;  Laterality: Left;    There were no vitals filed for this visit.   Subjective Assessment - 01/19/22 1009     Subjective  Then TENS is helping my shoulder pain    Pertinent History chronic Lt shoulder pain. 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 PMH: OA, DM, GERD, HLD, HTN, PAD, ACDF 2013.     Patient Stated Goals Increase shoulder motion and decrease pain    Currently in Pain? Yes    Pain Score 7     Pain Location Shoulder    Pain Orientation Left    Pain Descriptors / Indicators Aching;Burning    Pain Type Acute pain    Pain Onset More than a month ago    Pain Frequency Constant    Aggravating Factors  certain movements, cold rainy weather    Pain Relieving Factors ice, heat, tape, voltaren gel, TENS            Pt reports TENS device is going well at home.    Supine: Ultrasound x 10 min, continuous, 0.8 wts/cm2, 3 Mhz Lt shoulder (posteriorly, laterally at middle deltoid, and anteriorly near bicep insertion)  Supine: BUE sh flexion holding foam (palms facing)  Progressed to working on scapula protraction and retraction with sh flexed at 90* - pt required cues to not hold breath or hike shoulder.  Sh flex at 90* (supine) for circumduction ex's w/ 2 lb weight for scapula stability w/ no increase in pain.  Seated: attempted AA/ROM in sh abduction but increased pain therefore d/c.   Kinesiotaping to RELAX upper trap, middle deltoid, and pects major (as pt was point tender along pects), and to ACTIVATE scapula retraction and depression, and trunk extension.                      OT Short Term Goals - 12/08/21 0852       OT SHORT TERM GOAL #1   Title STG's = LTG's               OT Long Term Goals - 01/13/22 1146       OT LONG TERM GOAL #1   Title Independent with HEP for Lt shoulder    Time 4    Period Weeks    Status On-going      OT LONG TERM GOAL #2   Title Pt report greater ease and less pain donning overhead shirt and jacket    Time 4    Period Weeks    Status On-going      OT LONG TERM GOAL #3   Title Pt to increase shoulder flexion to 110* or greater to retrieve light objects from overhead shelf    Baseline 100*    Time 4    Period Weeks    Status On-going      OT LONG TERM GOAL #4   Title Pt to id pain management  strategies for Lt shoulder (modalities, positioning, etc)    Time 4    Period Weeks    Status New  Plan - 01/19/22 1233     Clinical Impression Statement Pt w/ continued pain Lt shoulder (some appears to be joint pain and some is muscular pain) - ? rotator cuff involvement. Pt tenses shoulders and elevates scapula to compensate. Note sent to Dr. Erlinda Hong re: concerns    OT Occupational Profile and History Problem Focused Assessment - Including review of records relating to presenting problem    Occupational performance deficits (Please refer to evaluation for details): ADL's;IADL's    Body Structure / Function / Physical Skills Strength;Pain;UE functional use;ROM;IADL;Body mechanics;Mobility    Rehab Potential Fair   chronic in nature, partial tear   Clinical Decision Making Limited treatment options, no task modification necessary    Comorbidities Affecting Occupational Performance: Presence of comorbidities impacting occupational performance    Comorbidities impacting occupational performance description: Lt AKA    Modification or Assistance to Complete Evaluation  No modification of tasks or assist necessary to complete eval    OT Frequency 2x / week    OT Duration 4 weeks    OT Treatment/Interventions Self-care/ADL training;Moist Heat;Therapeutic activities;Ultrasound;Therapeutic exercise;Coping strategies training;Neuromuscular education;Cryotherapy;Passive range of motion;Functional Mobility Training;Patient/family education;Manual Therapy;Electrical Stimulation    Plan Wife may come next session and will videotape how to properly apply kinesiotape for pain management Lt shoulder    Consulted and Agree with Plan of Care Patient             Patient will benefit from skilled therapeutic intervention in order to improve the following deficits and impairments:   Body Structure / Function / Physical Skills: Strength, Pain, UE functional use, ROM, IADL, Body  mechanics, Mobility       Visit Diagnosis: Chronic left shoulder pain  Muscle weakness (generalized)    Problem List Patient Active Problem List   Diagnosis Date Noted   Healthcare maintenance 08/17/2021   Loose stools 06/07/2021   Abnormal weight gain 03/30/2021   Anal fissure 03/30/2021   Fatty liver 03/30/2021   Flatulence, eructation and gas pain 03/30/2021   Hematochezia 03/30/2021   Imaging of gastrointestinal tract abnormal 03/30/2021   Personal history of colonic polyps 03/30/2021   Rectal pain 03/30/2021   Constipation 03/30/2021   Drug-induced constipation 03/30/2021   Dysphagia 03/30/2021   Colon cancer screening 03/30/2021   Right upper quadrant pain 03/30/2021   Gastroesophageal reflux disease 03/30/2021   Limb ischemia 02/19/2021   S/P above knee amputation, left ([place]) 02/19/2021   Symptomatic anemia 12/04/2020   PAOD (peripheral arterial occlusive disease) ([place]) 04/24/2020   Adhesive capsulitis 03/10/2020   Lung nodule < 6cm on CT 01/28/2019   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 [place], [place], [address] Phone: [phone]   Fax:  [phone]  Name: [name] MRN: [id number] Date of Birth: 02/23/1951

## 2022-01-25 ENCOUNTER — Ambulatory Visit: Payer: Medicare HMO | Admitting: Occupational Therapy

## 2022-01-25 ENCOUNTER — Encounter: Payer: Medicare HMO | Admitting: Rehabilitation

## 2022-01-25 ENCOUNTER — Other Ambulatory Visit: Payer: Self-pay

## 2022-01-25 DIAGNOSIS — G8929 Other chronic pain: Secondary | ICD-10-CM

## 2022-01-25 DIAGNOSIS — M6281 Muscle weakness (generalized): Secondary | ICD-10-CM | POA: Diagnosis not present

## 2022-01-25 DIAGNOSIS — R2681 Unsteadiness on feet: Secondary | ICD-10-CM | POA: Diagnosis not present

## 2022-01-25 DIAGNOSIS — R293 Abnormal posture: Secondary | ICD-10-CM | POA: Diagnosis not present

## 2022-01-25 DIAGNOSIS — M25512 Pain in left shoulder: Secondary | ICD-10-CM | POA: Diagnosis not present

## 2022-01-25 DIAGNOSIS — R2689 Other abnormalities of gait and mobility: Secondary | ICD-10-CM | POA: Diagnosis not present

## 2022-01-25 NOTE — Patient Instructions (Addendum)
(  Clinic) Retraction: Row - Bilateral ([place])    Facing pulley, arms reaching forward, pull hands toward stomach, pinching shoulder blades together. Do not hike shoulders Repeat __10__ times per set. Do __2__ sets per day, every other day.    Strengthening: Resisted Extension    Seated: Hold tubing in BOTH hands, arm forward. Pull arms back, elbows straight. Repeat _10___ times per set. Do _2___ sets per day, every other day.

## 2022-01-25 NOTE — Therapy (Signed)
[name] [address] [place], [place], [address] Phone: [phone]   Fax:  [phone]  Occupational Therapy Treatment  Patient Details  Name: [name] MRN: [id number] Date of Birth: [birth year] No data recorded  Encounter Date: 01/25/2022   OT End of Session - 01/25/22 1406     Visit Number 6    Number of Visits 10    Date for OT Re-Evaluation 02/09/22   renewal done on 01/06/22 - pt had only been seen for 2 visits prior to this   Authorization Type [employer] MCR/MCD    OT Start Time 1315    OT Stop Time 1357    OT Time Calculation (min) 42 min    Activity Tolerance Patient tolerated treatment well    Behavior During Therapy [facility] for tasks assessed/performed             Past Medical History:  Diagnosis Date   Adhesive capsulitis 03/10/2020   Anemia    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 Vertell Limber neurosurgery   Chronic lower back pain    Claudication of both lower extremities ([place]) 07/15/2015   Critical lower limb ischemia ([place]) 12/19/2019   Diabetes mellitus    takes Metformin daily   GERD (gastroesophageal reflux disease)    takes Protonix daily   History of blood transfusion    "related to low HgB" ((09/10/2015   Hyperlipidemia    takes Vytorin daily   Hypertension    takes Benazepril and Bystolic daily   PAD (peripheral artery disease) ([place])    Pneumonia    Radiculopathy, cervical region 02/11/2017   Shortness of breath dyspnea    with exertion   Tobacco user    Toe fracture, right 05/09/2011   Wears glasses     Past Surgical History:  Procedure Laterality Date   ABDOMINAL AORTOGRAM W/LOWER EXTREMITY N/A 12/11/2017   Procedure: ABDOMINAL AORTOGRAM W/LOWER EXTREMITY;  Surgeon: Angelia Mould, MD;  Location: [place] [facility];  Service: Cardiovascular;  Laterality: N/A;    ABDOMINAL AORTOGRAM W/LOWER EXTREMITY Bilateral 04/22/2020   Procedure: ABDOMINAL AORTOGRAM W/LOWER EXTREMITY;  Surgeon: Marty Heck, MD;  Location: [place] [facility];  Service: Cardiovascular;  Laterality: Bilateral;   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   AMPUTATION Left 02/19/2021   Procedure: LEFT ABOVE KNEE AMPUTATION;  Surgeon: Cherre Robins, MD;  Location: [place];  Service: Vascular;  Laterality: Left;   ANTERIOR CERVICAL DECOMP/DISCECTOMY FUSION  03/08/12   C6-7   ANTERIOR CERVICAL DECOMP/DISCECTOMY FUSION  03/08/2012   Procedure: ANTERIOR CERVICAL DECOMPRESSION/DISCECTOMY FUSION 1 LEVEL/HARDWARE REMOVAL;  Surgeon: Erline Levine, MD;  Location: [place] [facility];  Service: Neurosurgery;  Laterality: N/A;  revison of C5-7 anterior cervical decompression with fusion with Cervical Five-Thoracic One anterior cervical decompression with fusion with interbody prothesis plating and bonegraft   Center   BIOPSY  12/05/2020   Procedure: BIOPSY;  Surgeon: Ladene Artist, MD;  Location: [facility] ENDOSCOPY;  Service: Endoscopy;;   BYPASS GRAFT FEMORAL-PERONEAL Left 11/11/2016   Procedure: REDO LEFT FEMORAL-PERONEAL BYPASS WITH PROPATEN 6MM X 80CM GRAFT;  Surgeon: Angelia Mould, MD;  Location: [place];  Service: Vascular;  Laterality: Left;   COLONOSCOPY W/ BIOPSIES AND POLYPECTOMY  08/17/2012   f/u 5 years, 4 polyps, no high grade dysplasia or malignancy, tubular adenoma, hyperplastic polyops  COLONOSCOPY WITH PROPOFOL N/A 12/05/2020   Procedure: COLONOSCOPY WITH PROPOFOL;  Surgeon: Ladene Artist, MD;  Location: [facility];  Service: Endoscopy;  Laterality: N/A;   EMBOLECTOMY Left 12/19/2019   Procedure: LEFT FEMERAL- PERONEAL THROMBECTOMY;  Surgeon: Marty Heck, MD;  Location: [place];  Service: Vascular;  Laterality: Left;   ENDARTERECTOMY FEMORAL Right 04/24/2020   Procedure: RIGHT FEMORAL ENDARTERECTOMY;  Surgeon: Marty Heck, MD;  Location: [place];   Service: Vascular;  Laterality: Right;   ENTEROSCOPY N/A 12/11/2015   Procedure: ENTEROSCOPY;  Surgeon: Carol Ada, MD;  Location: [place];  Service: Endoscopy;  Laterality: N/A;   ENTEROSCOPY N/A 03/29/2018   Procedure: ENTEROSCOPY;  Surgeon: Carol Ada, MD;  Location: [place];  Service: Endoscopy;  Laterality: N/A;   ESOPHAGOGASTRODUODENOSCOPY  08/17/2012   normal esophagus and GEJ, diffuse gastritis with erythema- no malignancy, reactive gastropathy  with focal intestinal metaplasia   ESOPHAGOGASTRODUODENOSCOPY (EGD) WITH PROPOFOL N/A 12/05/2020   Procedure: ESOPHAGOGASTRODUODENOSCOPY (EGD) WITH PROPOFOL;  Surgeon: Ladene Artist, MD;  Location: [facility] ENDOSCOPY;  Service: Endoscopy;  Laterality: N/A;   FEMORAL-POPLITEAL BYPASS GRAFT Left 01/06/2016   Procedure: Left  COMMON FEMORAL-BELOW KNEE POPLITEAL ARTERY Bypass using non-reversed translocated saphenous vein graft from left leg;  Surgeon: Mal Misty, MD;  Location: [place];  Service: Vascular;  Laterality: Left;   GIVENS CAPSULE STUDY N/A 11/24/2015   Procedure: GIVENS CAPSULE STUDY;  Surgeon: Juanita Craver, MD;  Location: [place];  Service: Endoscopy;  Laterality: N/A;   HOT HEMOSTASIS N/A 03/29/2018   Procedure: HOT HEMOSTASIS (ARGON PLASMA COAGULATION/BICAP);  Surgeon: Carol Ada, MD;  Location: [place];  Service: Endoscopy;  Laterality: N/A;   INGUINAL HERNIA REPAIR  1990's   right   INTRAOPERATIVE ARTERIOGRAM Left 01/06/2016   Procedure: INTRA OPERATIVE ARTERIOGRAM LEFT LOWER LEG;  Surgeon: Mal Misty, MD;  Location: [place];  Service: Vascular;  Laterality: Left;   INTRAOPERATIVE ARTERIOGRAM Left 11/11/2016   Procedure: INTRA OPERATIVE ARTERIOGRAM LEFT LOWER EXTRIMITY;  Surgeon: Angelia Mould, MD;  Location: [place];  Service: Vascular;  Laterality: Left;   IR ANGIOGRAM FOLLOW UP STUDY  12/11/2017   IR GENERIC HISTORICAL  10/24/2016   IR ANGIOGRAM FOLLOW UP STUDY   LOWER EXTREMITY ANGIOGRAM Bilateral  07/30/2015   Procedure: Lower Extremity Angiogram;  Surgeon: Conrad Hobucken, MD;  Location: [place] [facility];  Service: Cardiovascular;  Laterality: Bilateral;   [place]   "lower"   PATCH ANGIOPLASTY Left 12/19/2019   Procedure: LEFT FEMORAL -PERONEAL PATCH ANGIOPLASTY WITH XENOSURE BIOLOGIC PATCH  ;  Surgeon: Marty Heck, MD;  Location: [place];  Service: Vascular;  Laterality: Left;   PATCH ANGIOPLASTY Right 04/24/2020   Procedure: Patch Angioplasty of Right Femoral Artery using Long Xenosure Biologic Patch 1cm x 14 cm;  Surgeon: Marty Heck, MD;  Location: [place];  Service: Vascular;  Laterality: Right;   PERIPHERAL VASCULAR CATHETERIZATION N/A 07/30/2015   Procedure: Abdominal Aortogram;  Surgeon: Conrad Farnham, MD;  Location: [place] [facility];  Service: Cardiovascular;  Laterality: N/A;   PERIPHERAL VASCULAR CATHETERIZATION N/A 10/24/2016   Procedure: Abdominal Aortogram;  Surgeon: Angelia Mould, MD;  Location: [place] [facility];  Service: Cardiovascular;  Laterality: N/A;   PERIPHERAL VASCULAR CATHETERIZATION N/A 10/24/2016   Procedure: Lower Extremity Angiography;  Surgeon: Angelia Mould, MD;  Location: [place] [facility];  Service: Cardiovascular;  Laterality: N/A;   PERIPHERAL VASCULAR INTERVENTION Right 12/11/2017  Procedure: PERIPHERAL VASCULAR INTERVENTION;  Surgeon: Angelia Mould, MD;  Location: [place] [facility];  Service: Cardiovascular;  Laterality: Right;   VASCULAR SURGERY  ~ 2007   Stent SFA    VEIN HARVEST Left 01/06/2016   Procedure: LEFT GREATER SAPPHENOUS VEIN HARVEST;  Surgeon: Mal Misty, MD;  Location: [place];  Service: Vascular;  Laterality: Left;    There were no vitals filed for this visit.   Subjective Assessment - 01/25/22 1351     Subjective  The kinesiotape you did last week helped better with the pain than the TENS    Pertinent History chronic Lt shoulder pain. 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 PMH: OA,  DM, GERD, HLD, HTN, PAD, ACDF 2013.    Patient Stated Goals Increase shoulder motion and decrease pain    Currently in Pain? Yes    Pain Score 7     Pain Location Shoulder    Pain Orientation Left    Pain Descriptors / Indicators Aching;Burning    Pain Type Acute pain    Pain Onset More than a month ago    Pain Frequency Constant    Aggravating Factors  certain movements, cold, rainy weather    Pain Relieving Factors ice, heat, tape, voltaren gel, TENS             Pt's wife came today for education in proper kinesiotape application: wife videotaped therapist while applying kinesiotape w/ verbal cues during taping. 4 pcs of tape to relax middle deltoid, upper trap, pects, and to activate trunk extension and scapula retraction/depression  Pt issued theraband HEP for posterior shoulder girdle - see pt instructions for details.                      OT Education - 01/25/22 1405     Education Details instructed wife in applying kinesiotape as she videotaped, posterior shoulder strengthening HEP    Person(s) Educated Patient    Methods Explanation;Demonstration;Verbal cues;Handout    Comprehension Verbalized understanding;Returned demonstration;Verbal cues required              OT Short Term Goals - 12/08/21 0852       OT SHORT TERM GOAL #1   Title STG's = LTG's               OT Long Term Goals - 01/13/22 1146       OT LONG TERM GOAL #1   Title Independent with HEP for Lt shoulder    Time 4    Period Weeks    Status On-going      OT LONG TERM GOAL #2   Title Pt report greater ease and less pain donning overhead shirt and jacket    Time 4    Period Weeks    Status On-going      OT LONG TERM GOAL #3   Title Pt to increase shoulder flexion to 110* or greater to retrieve light objects from overhead shelf    Baseline 100*    Time 4    Period Weeks    Status On-going      OT LONG TERM GOAL #4   Title Pt to id pain management strategies for  Lt shoulder (modalities, positioning, etc)    Time 4    Period Weeks    Status New                   Plan - 01/25/22 1407     Clinical  Impression Statement Pt responding well to kinesiotape for pain management Lt shoulder    OT Occupational Profile and History Problem Focused Assessment - Including review of records relating to presenting problem    Occupational performance deficits (Please refer to evaluation for details): ADL's;IADL's    Body Structure / Function / Physical Skills Strength;Pain;UE functional use;ROM;IADL;Body mechanics;Mobility    Rehab Potential Fair   chronic in nature, partial tear   Clinical Decision Making Limited treatment options, no task modification necessary    Comorbidities Affecting Occupational Performance: Presence of comorbidities impacting occupational performance    Comorbidities impacting occupational performance description: Lt AKA    Modification or Assistance to Complete Evaluation  No modification of tasks or assist necessary to complete eval    OT Frequency 2x / week    OT Duration 4 weeks    OT Treatment/Interventions Self-care/ADL training;Moist Heat;Therapeutic activities;Ultrasound;Therapeutic exercise;Coping strategies training;Neuromuscular education;Cryotherapy;Passive range of motion;Functional Mobility Training;Patient/family education;Manual Therapy;Electrical Stimulation    Plan continue [place], review HEP    Consulted and Agree with Plan of Care Patient             Patient will benefit from skilled therapeutic intervention in order to improve the following deficits and impairments:   Body Structure / Function / Physical Skills: Strength, Pain, UE functional use, ROM, IADL, Body mechanics, Mobility       Visit Diagnosis: Chronic left shoulder pain  Muscle weakness (generalized)    Problem List Patient Active Problem List   Diagnosis Date Noted   Healthcare maintenance 08/17/2021   Loose stools 06/07/2021    Abnormal weight gain 03/30/2021   Anal fissure 03/30/2021   Fatty liver 03/30/2021   Flatulence, eructation and gas pain 03/30/2021   Hematochezia 03/30/2021   Imaging of gastrointestinal tract abnormal 03/30/2021   Personal history of colonic polyps 03/30/2021   Rectal pain 03/30/2021   Constipation 03/30/2021   Drug-induced constipation 03/30/2021   Dysphagia 03/30/2021   Colon cancer screening 03/30/2021   Right upper quadrant pain 03/30/2021   Gastroesophageal reflux disease 03/30/2021   Limb ischemia 02/19/2021   S/P above knee amputation, left ([place]) 02/19/2021   Symptomatic anemia 12/04/2020   PAOD (peripheral arterial occlusive disease) ([place]) 04/24/2020   Adhesive capsulitis 03/10/2020   Lung nodule < 6cm on CT 01/28/2019   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 [place], [place], [address] Phone: [phone]   Fax:  [phone]  Name: [name] MRN: [id number] Date of Birth: 15-Feb-1951

## 2022-01-26 ENCOUNTER — Ambulatory Visit: Payer: Medicare HMO | Admitting: Occupational Therapy

## 2022-01-26 ENCOUNTER — Ambulatory Visit: Payer: Medicare HMO | Admitting: Rehabilitation

## 2022-01-27 ENCOUNTER — Encounter: Payer: Medicare HMO | Admitting: Rehabilitation

## 2022-01-28 ENCOUNTER — Other Ambulatory Visit: Payer: Self-pay | Admitting: Student

## 2022-01-28 ENCOUNTER — Telehealth: Payer: Self-pay

## 2022-01-28 DIAGNOSIS — E7800 Pure hypercholesterolemia, unspecified: Secondary | ICD-10-CM

## 2022-01-28 DIAGNOSIS — I10 Essential (primary) hypertension: Secondary | ICD-10-CM

## 2022-01-28 NOTE — Telephone Encounter (Signed)
PT called pt to check on him as we discussed at last visit. Left message on his voicemail. Let him know that he was on schedule for 2/7 still at this time and to call back if any changes needed. Cherly Anderson, PT, DPT, NCS

## 2022-01-29 ENCOUNTER — Other Ambulatory Visit: Payer: Self-pay | Admitting: Student

## 2022-01-29 DIAGNOSIS — I10 Essential (primary) hypertension: Secondary | ICD-10-CM

## 2022-02-01 ENCOUNTER — Ambulatory Visit: Payer: Medicare HMO

## 2022-02-01 ENCOUNTER — Other Ambulatory Visit: Payer: Self-pay

## 2022-02-01 ENCOUNTER — Ambulatory Visit: Payer: Medicare HMO | Attending: Neurology | Admitting: Occupational Therapy

## 2022-02-01 DIAGNOSIS — M25512 Pain in left shoulder: Secondary | ICD-10-CM | POA: Diagnosis not present

## 2022-02-01 DIAGNOSIS — R2689 Other abnormalities of gait and mobility: Secondary | ICD-10-CM | POA: Insufficient documentation

## 2022-02-01 DIAGNOSIS — G8929 Other chronic pain: Secondary | ICD-10-CM | POA: Insufficient documentation

## 2022-02-01 DIAGNOSIS — M6281 Muscle weakness (generalized): Secondary | ICD-10-CM | POA: Diagnosis not present

## 2022-02-01 NOTE — Therapy (Signed)
[facility] [address] [place], [place], [address] Phone: [phone]   Fax:  [phone]  Occupational Therapy Treatment  Patient Details  Name: [name] MRN: [id number] Date of Birth: 1951-06-23 No data recorded  Encounter Date: 02/01/2022   OT End of Session - 02/01/22 1006     Visit Number 7    Number of Visits 10    Date for OT Re-Evaluation 02/09/22   renewal done on 01/06/22 - pt had only been seen for 2 visits prior to this   Authorization Type [employer] MCR/MCD    OT Start Time [phone]    OT Stop Time 1005    OT Time Calculation (min) 40 min    Activity Tolerance Patient tolerated treatment well    Behavior During Therapy [facility] for tasks assessed/performed             Past Medical History:  Diagnosis Date   Adhesive capsulitis 03/10/2020   Anemia    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 Vertell Limber neurosurgery   Chronic lower back pain    Claudication of both lower extremities ([place]) 07/15/2015   Critical lower limb ischemia ([place]) 12/19/2019   Diabetes mellitus    takes Metformin daily   GERD (gastroesophageal reflux disease)    takes Protonix daily   History of blood transfusion    "related to low HgB" ((09/10/2015   Hyperlipidemia    takes Vytorin daily   Hypertension    takes Benazepril and Bystolic daily   PAD (peripheral artery disease) ([place])    Pneumonia    Radiculopathy, cervical region 02/11/2017   Shortness of breath dyspnea    with exertion   Tobacco user    Toe fracture, right 05/09/2011   Wears glasses     Past Surgical History:  Procedure Laterality Date   ABDOMINAL AORTOGRAM W/LOWER EXTREMITY N/A 12/11/2017   Procedure: ABDOMINAL AORTOGRAM W/LOWER EXTREMITY;  Surgeon: Angelia Mould, MD;  Location: [place] [facility];  Service: Cardiovascular;  Laterality: N/A;    ABDOMINAL AORTOGRAM W/LOWER EXTREMITY Bilateral 04/22/2020   Procedure: ABDOMINAL AORTOGRAM W/LOWER EXTREMITY;  Surgeon: Marty Heck, MD;  Location: [place];  Service: Cardiovascular;  Laterality: Bilateral;   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   AMPUTATION Left 02/19/2021   Procedure: LEFT ABOVE KNEE AMPUTATION;  Surgeon: Cherre Robins, MD;  Location: [place];  Service: Vascular;  Laterality: Left;   ANTERIOR CERVICAL DECOMP/DISCECTOMY FUSION  03/08/12   C6-7   ANTERIOR CERVICAL DECOMP/DISCECTOMY FUSION  03/08/2012   Procedure: ANTERIOR CERVICAL DECOMPRESSION/DISCECTOMY FUSION 1 LEVEL/HARDWARE REMOVAL;  Surgeon: Erline Levine, MD;  Location: [facility] [facility];  Service: Neurosurgery;  Laterality: N/A;  revison of C5-7 anterior cervical decompression with fusion with Cervical Five-Thoracic One anterior cervical decompression with fusion with interbody prothesis plating and bonegraft   [place]   BIOPSY  12/05/2020   Procedure: BIOPSY;  Surgeon: Ladene Artist, MD;  Location: [facility] ENDOSCOPY;  Service: Endoscopy;;   BYPASS GRAFT FEMORAL-PERONEAL Left 11/11/2016   Procedure: REDO LEFT FEMORAL-PERONEAL BYPASS WITH PROPATEN 6MM X 80CM GRAFT;  Surgeon: Angelia Mould, MD;  Location: [place];  Service: Vascular;  Laterality: Left;   COLONOSCOPY W/ BIOPSIES AND POLYPECTOMY  08/17/2012   f/u 5 years, 4 polyps, no high grade dysplasia or malignancy, tubular adenoma, hyperplastic polyops  COLONOSCOPY WITH PROPOFOL N/A 12/05/2020   Procedure: COLONOSCOPY WITH PROPOFOL;  Surgeon: Ladene Artist, MD;  Location: [facility]COPY;  Service: Endoscopy;  Laterality: N/A;   EMBOLECTOMY Left 12/19/2019   Procedure: LEFT FEMERAL- PERONEAL THROMBECTOMY;  Surgeon: Marty Heck, MD;  Location: [facility];  Service: Vascular;  Laterality: Left;   ENDARTERECTOMY FEMORAL Right 04/24/2020   Procedure: RIGHT FEMORAL ENDARTERECTOMY;  Surgeon: Marty Heck, MD;  Location: [place];   Service: Vascular;  Laterality: Right;   ENTEROSCOPY N/A 12/11/2015   Procedure: ENTEROSCOPY;  Surgeon: Carol Ada, MD;  Location: [place];  Service: Endoscopy;  Laterality: N/A;   ENTEROSCOPY N/A 03/29/2018   Procedure: ENTEROSCOPY;  Surgeon: Carol Ada, MD;  Location: [facility];  Service: Endoscopy;  Laterality: N/A;   ESOPHAGOGASTRODUODENOSCOPY  08/17/2012   normal esophagus and GEJ, diffuse gastritis with erythema- no malignancy, reactive gastropathy  with focal intestinal metaplasia   ESOPHAGOGASTRODUODENOSCOPY (EGD) WITH PROPOFOL N/A 12/05/2020   Procedure: ESOPHAGOGASTRODUODENOSCOPY (EGD) WITH PROPOFOL;  Surgeon: Ladene Artist, MD;  Location: [facility] ENDOSCOPY;  Service: Endoscopy;  Laterality: N/A;   FEMORAL-POPLITEAL BYPASS GRAFT Left 01/06/2016   Procedure: Left  COMMON FEMORAL-BELOW KNEE POPLITEAL ARTERY Bypass using non-reversed translocated saphenous vein graft from left leg;  Surgeon: Mal Misty, MD;  Location: [facility];  Service: Vascular;  Laterality: Left;   GIVENS CAPSULE STUDY N/A 11/24/2015   Procedure: GIVENS CAPSULE STUDY;  Surgeon: Juanita Craver, MD;  Location: [place];  Service: Endoscopy;  Laterality: N/A;   HOT HEMOSTASIS N/A 03/29/2018   Procedure: HOT HEMOSTASIS (ARGON PLASMA COAGULATION/BICAP);  Surgeon: Carol Ada, MD;  Location: [place];  Service: Endoscopy;  Laterality: N/A;   INGUINAL HERNIA REPAIR  1990's   right   INTRAOPERATIVE ARTERIOGRAM Left 01/06/2016   Procedure: INTRA OPERATIVE ARTERIOGRAM LEFT LOWER LEG;  Surgeon: Mal Misty, MD;  Location: [facility];  Service: Vascular;  Laterality: Left;   INTRAOPERATIVE ARTERIOGRAM Left 11/11/2016   Procedure: INTRA OPERATIVE ARTERIOGRAM LEFT LOWER EXTRIMITY;  Surgeon: Angelia Mould, MD;  Location: [place];  Service: Vascular;  Laterality: Left;   IR ANGIOGRAM FOLLOW UP STUDY  12/11/2017   IR GENERIC HISTORICAL  10/24/2016   IR ANGIOGRAM FOLLOW UP STUDY   LOWER EXTREMITY ANGIOGRAM Bilateral  07/30/2015   Procedure: Lower Extremity Angiogram;  Surgeon: Conrad Hobucken, MD;  Location: [place] [facility];  Service: Cardiovascular;  Laterality: Bilateral;   [place]   "lower"   PATCH ANGIOPLASTY Left 12/19/2019   Procedure: LEFT FEMORAL -PERONEAL PATCH ANGIOPLASTY WITH XENOSURE BIOLOGIC PATCH  ;  Surgeon: Marty Heck, MD;  Location: [facility];  Service: Vascular;  Laterality: Left;   PATCH ANGIOPLASTY Right 04/24/2020   Procedure: Patch Angioplasty of Right Femoral Artery using Long Xenosure Biologic Patch 1cm x 14 cm;  Surgeon: Marty Heck, MD;  Location: [place];  Service: Vascular;  Laterality: Right;   PERIPHERAL VASCULAR CATHETERIZATION N/A 07/30/2015   Procedure: Abdominal Aortogram;  Surgeon: Conrad Farnham, MD;  Location: [place] [facility];  Service: Cardiovascular;  Laterality: N/A;   PERIPHERAL VASCULAR CATHETERIZATION N/A 10/24/2016   Procedure: Abdominal Aortogram;  Surgeon: Angelia Mould, MD;  Location: [place] [facility];  Service: Cardiovascular;  Laterality: N/A;   PERIPHERAL VASCULAR CATHETERIZATION N/A 10/24/2016   Procedure: Lower Extremity Angiography;  Surgeon: Angelia Mould, MD;  Location: [place] [facility];  Service: Cardiovascular;  Laterality: N/A;   PERIPHERAL VASCULAR INTERVENTION Right 12/11/2017  Procedure: PERIPHERAL VASCULAR INTERVENTION;  Surgeon: Angelia Mould, MD;  Location: [place] [facility];  Service: Cardiovascular;  Laterality: Right;   VASCULAR SURGERY  ~ 2007   Stent SFA    VEIN HARVEST Left 01/06/2016   Procedure: LEFT GREATER SAPPHENOUS VEIN HARVEST;  Surgeon: Mal Misty, MD;  Location: [facility];  Service: Vascular;  Laterality: Left;    There were no vitals filed for this visit.   Subjective Assessment - 02/01/22 0925     Subjective  The taping helps more than anything. I'd rather have that than the ultrasound. We ordered the kinesiotape but it hasn't come in yet    Pertinent History chronic  Lt shoulder pain. 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 PMH: OA, DM, GERD, HLD, HTN, PAD, ACDF 2013.    Patient Stated Goals Increase shoulder motion and decrease pain    Currently in Pain? Yes    Pain Score 7     Pain Location Shoulder    Pain Orientation Left    Pain Descriptors / Indicators Aching;Throbbing;Burning    Pain Type Acute pain    Pain Onset More than a month ago    Pain Frequency Constant    Aggravating Factors  certain movements, cold/rainy weather    Pain Relieving Factors ice, heat, tape, voltaren gel, TENS             Re-applied kinesiotape Lt shoulder per pt request (to relax middle deltoid, upper traps, and pects, and to activate trunk extension and scapula retraction). Pt wished to kinesiotape vs. Ultrasound. Pt has ordered tape to apply at home (wife videotaped how to properly don last session)  Reviewed theraband HEP. Added horizontal abduction x 10 reps w/ red theraband, however pt had more pain with this therefore did not issue as HEP  Assessed goals and progress to date - see below.                         OT Short Term Goals - 12/08/21 0852       OT SHORT TERM GOAL #1   Title STG's = LTG's               OT Long Term Goals - 02/01/22 1007       OT LONG TERM GOAL #1   Title Independent with HEP for Lt shoulder    Time 4    Period Weeks    Status Achieved      OT LONG TERM GOAL #2   Title Pt report greater ease and less pain donning overhead shirt and jacket    Time 4    Period Weeks    Status Not Met   per pt report     OT LONG TERM GOAL #3   Title Pt to increase shoulder flexion to 110* or greater to retrieve light objects from overhead shelf    Baseline 100*    Time 4    Period Weeks    Status Achieved   with taping to shoulder     OT LONG TERM GOAL #4   Title Pt to id pain management strategies for Lt shoulder (modalities, positioning, etc)    Time 4    Period Weeks    Status Achieved                    Plan - 02/01/22 1008     Clinical Impression Statement Pt encouraged to call Dr. Erlinda Hong and given  contact info. Pt responds well to taping to help relieve pain Lt shoulder    OT Occupational Profile and History Problem Focused Assessment - Including review of records relating to presenting problem    Occupational performance deficits (Please refer to evaluation for details): ADL's;IADL's    Body Structure / Function / Physical Skills Strength;Pain;UE functional use;ROM;IADL;Body mechanics;Mobility    Rehab Potential Fair   chronic in nature, partial tear   Clinical Decision Making Limited treatment options, no task modification necessary    Comorbidities Affecting Occupational Performance: Presence of comorbidities impacting occupational performance    Comorbidities impacting occupational performance description: Lt AKA    Modification or Assistance to Complete Evaluation  No modification of tasks or assist necessary to complete eval    OT Frequency 2x / week    OT Duration 4 weeks    OT Treatment/Interventions Self-care/ADL training;Moist Heat;Therapeutic activities;Ultrasound;Therapeutic exercise;Coping strategies training;Neuromuscular education;Cryotherapy;Passive range of motion;Functional Mobility Training;Patient/family education;Manual Therapy;Electrical Stimulation    Plan review HEP's prn, place on hold after following session until pt sees Dr. Patrick Jupiter and Agree with Plan of Care Patient             Patient will benefit from skilled therapeutic intervention in order to improve the following deficits and impairments:   Body Structure / Function / Physical Skills: Strength, Pain, UE functional use, ROM, IADL, Body mechanics, Mobility       Visit Diagnosis: Chronic left shoulder pain  Muscle weakness (generalized)    Problem List Patient Active Problem List   Diagnosis Date Noted   Healthcare maintenance 08/17/2021   Loose stools 06/07/2021    Abnormal weight gain 03/30/2021   Anal fissure 03/30/2021   Fatty liver 03/30/2021   Flatulence, eructation and gas pain 03/30/2021   Hematochezia 03/30/2021   Imaging of gastrointestinal tract abnormal 03/30/2021   Personal history of colonic polyps 03/30/2021   Rectal pain 03/30/2021   Constipation 03/30/2021   Drug-induced constipation 03/30/2021   Dysphagia 03/30/2021   Colon cancer screening 03/30/2021   Right upper quadrant pain 03/30/2021   Gastroesophageal reflux disease 03/30/2021   Limb ischemia 02/19/2021   S/P above knee amputation, left ([place]) 02/19/2021   Symptomatic anemia 12/04/2020   PAOD (peripheral arterial occlusive disease) ([place]) 04/24/2020   Adhesive capsulitis 03/10/2020   Lung nodule < 6cm on CT 01/28/2019   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

## 2022-02-01 NOTE — Therapy (Signed)
[facility] [address] [place], [place], [address] Phone: [phone]   Fax:  [phone]  Physical Therapy Treatment-arrived no charge  Patient Details  Name: [name] MRN: [id number] Date of Birth: [birth year] Referring Provider (PT): Risa Grill, [place]   Encounter Date: 02/01/2022   PT End of Session - 02/01/22 1032     Visit Number 5    Number of Visits 25    Date for PT Re-Evaluation 02/18/22    Authorization Type aetna medicare with medicaid secondary, 10th visit progress note    PT Start Time 1019    PT Stop Time 1025    PT Time Calculation (min) 6 min    Equipment Utilized During Treatment Gait belt    Activity Tolerance Patient tolerated treatment well    Behavior During Therapy [facility] for tasks assessed/performed             Past Medical History:  Diagnosis Date   Adhesive capsulitis 03/10/2020   Anemia    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 Vertell Limber neurosurgery   Chronic lower back pain    Claudication of both lower extremities ([place]) 07/15/2015   Critical lower limb ischemia ([place]) 12/19/2019   Diabetes mellitus    takes Metformin daily   GERD (gastroesophageal reflux disease)    takes Protonix daily   History of blood transfusion    "related to low HgB" ((09/10/2015   Hyperlipidemia    takes Vytorin daily   Hypertension    takes Benazepril and Bystolic daily   PAD (peripheral artery disease) ([place])    Pneumonia    Radiculopathy, cervical region 02/11/2017   Shortness of breath dyspnea    with exertion   Tobacco user    Toe fracture, right 05/09/2011   Wears glasses     Past Surgical History:  Procedure Laterality Date   ABDOMINAL AORTOGRAM W/LOWER EXTREMITY N/A 12/11/2017   Procedure: ABDOMINAL AORTOGRAM W/LOWER EXTREMITY;  Surgeon: Angelia Mould, MD;  Location:  [facility];  Service: Cardiovascular;  Laterality: N/A;   ABDOMINAL AORTOGRAM W/LOWER EXTREMITY Bilateral 04/22/2020   Procedure: ABDOMINAL AORTOGRAM W/LOWER EXTREMITY;  Surgeon: Marty Heck, MD;  Location: [facility];  Service: Cardiovascular;  Laterality: Bilateral;   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   AMPUTATION Left 02/19/2021   Procedure: LEFT ABOVE KNEE AMPUTATION;  Surgeon: Cherre Robins, MD;  Location: [facility];  Service: Vascular;  Laterality: Left;   ANTERIOR CERVICAL DECOMP/DISCECTOMY FUSION  03/08/12   C6-7   ANTERIOR CERVICAL DECOMP/DISCECTOMY FUSION  03/08/2012   Procedure: ANTERIOR CERVICAL DECOMPRESSION/DISCECTOMY FUSION 1 LEVEL/HARDWARE REMOVAL;  Surgeon: Erline Levine, MD;  Location: [facility];  Service: Neurosurgery;  Laterality: N/A;  revison of C5-7 anterior cervical decompression with fusion with Cervical Five-Thoracic One anterior cervical decompression with fusion with interbody prothesis plating and bonegraft   [place]   BIOPSY  12/05/2020   Procedure: BIOPSY;  Surgeon: Ladene Artist, MD;  Location: [facility];  Service: Endoscopy;;   BYPASS GRAFT FEMORAL-PERONEAL Left 11/11/2016   Procedure: REDO LEFT FEMORAL-PERONEAL BYPASS WITH PROPATEN 6MM X 80CM GRAFT;  Surgeon: Angelia Mould, MD;  Location: [facility];  Service: Vascular;  Laterality: Left;   COLONOSCOPY W/ BIOPSIES AND POLYPECTOMY  08/17/2012   f/u 5 years, 4 polyps, no high grade dysplasia or malignancy,  tubular adenoma, hyperplastic polyops   COLONOSCOPY WITH PROPOFOL N/A 12/05/2020   Procedure: COLONOSCOPY WITH PROPOFOL;  Surgeon: Ladene Artist, MD;  Location: [facility] ENDOSCOPY;  Service: Endoscopy;  Laterality: N/A;   EMBOLECTOMY Left 12/19/2019   Procedure: LEFT FEMERAL- PERONEAL THROMBECTOMY;  Surgeon: Marty Heck, MD;  Location: [facility];  Service: Vascular;  Laterality: Left;   ENDARTERECTOMY FEMORAL Right 04/24/2020   Procedure: RIGHT FEMORAL  ENDARTERECTOMY;  Surgeon: Marty Heck, MD;  Location: [facility];  Service: Vascular;  Laterality: Right;   ENTEROSCOPY N/A 12/11/2015   Procedure: ENTEROSCOPY;  Surgeon: Carol Ada, MD;  Location: [facility];  Service: Endoscopy;  Laterality: N/A;   ENTEROSCOPY N/A 03/29/2018   Procedure: ENTEROSCOPY;  Surgeon: Carol Ada, MD;  Location: [facility];  Service: Endoscopy;  Laterality: N/A;   ESOPHAGOGASTRODUODENOSCOPY  08/17/2012   normal esophagus and GEJ, diffuse gastritis with erythema- no malignancy, reactive gastropathy  with focal intestinal metaplasia   ESOPHAGOGASTRODUODENOSCOPY (EGD) WITH PROPOFOL N/A 12/05/2020   Procedure: ESOPHAGOGASTRODUODENOSCOPY (EGD) WITH PROPOFOL;  Surgeon: Ladene Artist, MD;  Location: [facility] ENDOSCOPY;  Service: Endoscopy;  Laterality: N/A;   FEMORAL-POPLITEAL BYPASS GRAFT Left 01/06/2016   Procedure: Left  COMMON FEMORAL-BELOW KNEE POPLITEAL ARTERY Bypass using non-reversed translocated saphenous vein graft from left leg;  Surgeon: Mal Misty, MD;  Location: [facility];  Service: Vascular;  Laterality: Left;   GIVENS CAPSULE STUDY N/A 11/24/2015   Procedure: GIVENS CAPSULE STUDY;  Surgeon: Juanita Craver, MD;  Location: [facility];  Service: Endoscopy;  Laterality: N/A;   HOT HEMOSTASIS N/A 03/29/2018   Procedure: HOT HEMOSTASIS (ARGON PLASMA COAGULATION/BICAP);  Surgeon: Carol Ada, MD;  Location: [facility];  Service: Endoscopy;  Laterality: N/A;   INGUINAL HERNIA REPAIR  1990's   right   INTRAOPERATIVE ARTERIOGRAM Left 01/06/2016   Procedure: INTRA OPERATIVE ARTERIOGRAM LEFT LOWER LEG;  Surgeon: Mal Misty, MD;  Location: [facility];  Service: Vascular;  Laterality: Left;   INTRAOPERATIVE ARTERIOGRAM Left 11/11/2016   Procedure: INTRA OPERATIVE ARTERIOGRAM LEFT LOWER EXTRIMITY;  Surgeon: Angelia Mould, MD;  Location: [facility];  Service: Vascular;  Laterality: Left;   IR ANGIOGRAM FOLLOW UP STUDY  12/11/2017   IR GENERIC HISTORICAL  10/24/2016    IR ANGIOGRAM FOLLOW UP STUDY   LOWER EXTREMITY ANGIOGRAM Bilateral 07/30/2015   Procedure: Lower Extremity Angiogram;  Surgeon: Conrad [place], MD;  Location: [place] [facility];  Service: Cardiovascular;  Laterality: Bilateral;   [place]   "lower"   PATCH ANGIOPLASTY Left 12/19/2019   Procedure: LEFT FEMORAL -PERONEAL PATCH ANGIOPLASTY WITH XENOSURE BIOLOGIC PATCH  ;  Surgeon: Marty Heck, MD;  Location: [facility];  Service: Vascular;  Laterality: Left;   PATCH ANGIOPLASTY Right 04/24/2020   Procedure: Patch Angioplasty of Right Femoral Artery using Long Xenosure Biologic Patch 1cm x 14 cm;  Surgeon: Marty Heck, MD;  Location: [facility];  Service: Vascular;  Laterality: Right;   PERIPHERAL VASCULAR CATHETERIZATION N/A 07/30/2015   Procedure: Abdominal Aortogram;  Surgeon: Conrad Ringtown, MD;  Location: [facility];  Service: Cardiovascular;  Laterality: N/A;   PERIPHERAL VASCULAR CATHETERIZATION N/A 10/24/2016   Procedure: Abdominal Aortogram;  Surgeon: Angelia Mould, MD;  Location: [facility];  Service: Cardiovascular;  Laterality: N/A;   PERIPHERAL VASCULAR CATHETERIZATION N/A 10/24/2016   Procedure: Lower Extremity Angiography;  Surgeon: Angelia Mould, MD;  Location: [facility];  Service: Cardiovascular;  Laterality: N/A;   PERIPHERAL  VASCULAR INTERVENTION Right 12/11/2017   Procedure: PERIPHERAL VASCULAR INTERVENTION;  Surgeon: Angelia Mould, MD;  Location: [facility];  Service: Cardiovascular;  Laterality: Right;   VASCULAR SURGERY  ~ 2007   Stent SFA    VEIN HARVEST Left 01/06/2016   Procedure: LEFT GREATER SAPPHENOUS VEIN HARVEST;  Surgeon: Mal Misty, MD;  Location: [facility];  Service: Vascular;  Laterality: Left;    There were no vitals filed for this visit.   Subjective Assessment - 02/01/22 1030     Subjective Pt did not have prosthesis with him as still having severe pain in residual limb in same  place when wearing. Waiting on insurance to authorize an MRI for his leg. Wants to hold until figures out what is going on. Is going to call his doctor again today. Saw prosthetist yesterday and he agreed that we need to wait until pain is figured out to proceed to try to offload that area some. Pt wearing liner 3 hours, 2x/day and reports skin doing well. Instructed to continue to increase to 4 hours, 2x/day to keep working on wear time at least.    Patient Stated Goals Pt wants to get on his leg to be able to walk wihtout walker.    Pain Onset More than a month ago    Pain Onset More than a month ago                                          PT Short Term Goals - 01/06/22 1910       PT SHORT TERM GOAL #1   Title Pt will be able to tolerate wearing prosthesis 8 hours/day for improved function.    Baseline currently 1-3 hours/day    Time 4    Status On-going    Target Date 01/25/22      PT SHORT TERM GOAL #2   Title Pt will be independent with prosthetic management.    Baseline 01/06/22 Pt still needing assistance for sock and wear time adjustment    Time 4    Period Weeks    Status On-going    Target Date 01/25/22      PT SHORT TERM GOAL #3   Title Pt will increase Berg score from 21 to >30/56 for improved balance and decreased fall risk.    Baseline 12/02/21 21/56.    Time 4    Period Weeks    Status Revised    Target Date 01/25/22      PT SHORT TERM GOAL #4   Title Pt will ambulate 200' with RW supervision for improved household ambulation.    Baseline 01/06/22 60' with RW CGA    Time 4    Period Weeks    Status On-going    Target Date 01/25/22               PT Long Term Goals - 12/02/21 1416       PT LONG TERM GOAL #1   Title Pt will be independent with progressive HEP to continue strength and balance gains on own. (LTGs due 02/18/22)    Time 12    Period Weeks    Status New    Target Date 02/18/22      PT LONG TERM GOAL #2    Title Pt will increase gait speed to >0.11m/s for improved household mobility.  Baseline 11/26/21 0.18m/s    Time 12    Period Weeks    Status New    Target Date 02/18/22      PT LONG TERM GOAL #3   Title Pt will increase Berg Balance to >40/56 for improved balance and decreased fall risk.    Baseline 12/02/21 21/56    Time 12    Period Weeks    Status Revised    Target Date 02/18/22      PT LONG TERM GOAL #4   Title Pt will ambulate >400' on varied level surfaces mod I with RW for improved short community distances.    Time 12    Period Weeks    Status New    Target Date 02/18/22      PT LONG TERM GOAL #5   Title Pt will ambulate up/down 4 steps with rails mod I for improved community access.    Time 12    Period Weeks    Status New    Target Date 02/18/22      PT LONG TERM GOAL #6   Title Pt will ambulate up/down ramp and curb with RW mod I for improved community access.    Time 12    Period Weeks    Status New    Target Date 02/18/22                   Plan - 02/01/22 1032     Clinical Impression Statement Visit witheld still due to still waiting on imaging for residual limb due to pain.    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 Stanford Breed. PMH includes extensive vascular surgery history (L femoral to peroneal bypass 12/2015; R EIA stent 2018; thrombectomy of L femoral to peroneal bypass and L peroneal artery 12/20; R femoral endarterectomy, profundoplasty with bovine pericardial patch on 03/2020), OA, DM, GERD, HLD, HTN, PAD, ACDF 2013. Chronic left shoulder pain    Examination-Activity Limitations Locomotion Level;Transfers;Stairs;Stand;Squat    Examination-Participation Restrictions Cleaning;Community Activity;Yard Work;Meal Prep    Stability/Clinical Decision Making Evolving/Moderate complexity    Rehab Potential Good    PT Frequency 2x / week   plus eval   PT Duration 12 weeks    PT  Treatment/Interventions ADLs/Self Care Home Management;DME Instruction;Cryotherapy;Moist Heat;Gait training;Stair training;Functional mobility training;Therapeutic activities;Therapeutic exercise;Balance training;Neuromuscular re-education;Manual techniques;Prosthetic Training;Passive range of motion;Vestibular;Patient/family education    PT Next Visit Plan Holding until pain in residual limb is further addressed. Waiting on MRI? How is pain doing? Is he wearing liner 4 hours 2x/day? What changes if any did Bobby make. Check Berg STG. Continue HEP at counter with focus on increasing weight shift over prosthesis, gait training with RW. Prosthetic education. . He has been limited by pain.    Consulted and Agree with Plan of Care Patient             Patient will benefit from skilled therapeutic intervention in order to improve the following deficits and impairments:  Abnormal gait, Decreased mobility, Decreased strength, Postural dysfunction, Prosthetic Dependency, Decreased range of motion, Decreased activity tolerance, Decreased balance, Decreased knowledge of use of DME, Decreased endurance  Visit Diagnosis: Other abnormalities of gait and mobility     Problem List Patient Active Problem List   Diagnosis Date Noted   Healthcare maintenance 08/17/2021   Loose stools 06/07/2021   Abnormal weight gain 03/30/2021   Anal fissure 03/30/2021   Fatty liver 03/30/2021  Flatulence, eructation and gas pain 03/30/2021   Hematochezia 03/30/2021   Imaging of gastrointestinal tract abnormal 03/30/2021   Personal history of colonic polyps 03/30/2021   Rectal pain 03/30/2021   Constipation 03/30/2021   Drug-induced constipation 03/30/2021   Dysphagia 03/30/2021   Colon cancer screening 03/30/2021   Right upper quadrant pain 03/30/2021   Gastroesophageal reflux disease 03/30/2021   Limb ischemia 02/19/2021   S/P above knee amputation, left ([place]) 02/19/2021   Symptomatic anemia 12/04/2020    PAOD (peripheral arterial occlusive disease) ([place]) 04/24/2020   Adhesive capsulitis 03/10/2020   Lung nodule < 6cm on CT 01/28/2019   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 [place], [place], [address] Phone: [phone]   Fax:  [phone]  Name: [name] MRN: [id number] Date of Birth: [birth year]

## 2022-02-03 ENCOUNTER — Ambulatory Visit: Payer: Medicare HMO

## 2022-02-08 ENCOUNTER — Ambulatory Visit: Payer: Medicare HMO | Admitting: Orthopaedic Surgery

## 2022-02-08 ENCOUNTER — Ambulatory Visit: Payer: Medicare HMO

## 2022-02-08 ENCOUNTER — Ambulatory Visit: Payer: Medicare HMO | Admitting: Occupational Therapy

## 2022-02-08 ENCOUNTER — Encounter: Payer: Medicare HMO | Admitting: Occupational Therapy

## 2022-02-10 ENCOUNTER — Encounter: Payer: Medicare HMO | Admitting: Occupational Therapy

## 2022-02-10 ENCOUNTER — Telehealth: Payer: Self-pay

## 2022-02-10 ENCOUNTER — Ambulatory Visit: Payer: Medicare HMO

## 2022-02-10 ENCOUNTER — Telehealth: Payer: Self-pay | Admitting: *Deleted

## 2022-02-10 NOTE — Telephone Encounter (Signed)
PT called pt and left message as missed second appointment this week to check on him. Advised that we will remove the 2 visits next week and to call back when he is able to return due to getting leg further assessed.  Cherly Anderson, PT, DPT, NCS

## 2022-02-10 NOTE — Telephone Encounter (Signed)
Patient call stating he is having some Right groin pain. He states a knot came up several days ago. He is going to make an appt with his primary Dr to see if this is a lymph node. If not a lymph node and is vascular related per his primary he will call back to schedule an appt for evaluation. If it gets larger and more painful patient advised to go to Urgent care or ER. Patient verbalized understanding.

## 2022-02-15 ENCOUNTER — Encounter: Payer: Medicare HMO | Admitting: Occupational Therapy

## 2022-02-15 ENCOUNTER — Ambulatory Visit: Payer: Medicare HMO

## 2022-02-17 ENCOUNTER — Encounter: Payer: Medicare HMO | Admitting: Occupational Therapy

## 2022-02-25 ENCOUNTER — Other Ambulatory Visit: Payer: Self-pay | Admitting: Student

## 2022-02-25 DIAGNOSIS — I10 Essential (primary) hypertension: Secondary | ICD-10-CM

## 2022-03-02 ENCOUNTER — Other Ambulatory Visit: Payer: Self-pay | Admitting: Student

## 2022-03-02 DIAGNOSIS — I10 Essential (primary) hypertension: Secondary | ICD-10-CM

## 2022-03-02 DIAGNOSIS — E7800 Pure hypercholesterolemia, unspecified: Secondary | ICD-10-CM

## 2022-03-03 ENCOUNTER — Ambulatory Visit (INDEPENDENT_AMBULATORY_CARE_PROVIDER_SITE_OTHER): Payer: Medicare HMO | Admitting: Student

## 2022-03-03 ENCOUNTER — Encounter: Payer: Self-pay | Admitting: Student

## 2022-03-03 ENCOUNTER — Other Ambulatory Visit: Payer: Self-pay

## 2022-03-03 VITALS — BP 157/77 | HR 80 | Wt 196.4 lb

## 2022-03-03 DIAGNOSIS — M62838 Other muscle spasm: Secondary | ICD-10-CM

## 2022-03-03 DIAGNOSIS — E1151 Type 2 diabetes mellitus with diabetic peripheral angiopathy without gangrene: Secondary | ICD-10-CM

## 2022-03-03 DIAGNOSIS — I70209 Unspecified atherosclerosis of native arteries of extremities, unspecified extremity: Secondary | ICD-10-CM | POA: Diagnosis not present

## 2022-03-03 DIAGNOSIS — R1909 Other intra-abdominal and pelvic swelling, mass and lump: Secondary | ICD-10-CM | POA: Insufficient documentation

## 2022-03-03 LAB — POCT GLYCOSYLATED HEMOGLOBIN (HGB A1C): HbA1c, POC (controlled diabetic range): 8.8 % — AB (ref 0.0–7.0)

## 2022-03-03 MED ORDER — BACLOFEN 10 MG PO TABS: 5.0000 mg | ORAL_TABLET | Freq: Two times a day (BID) | ORAL | 0 refills | Status: DC

## 2022-03-03 NOTE — Patient Instructions (Signed)
It was great to see you! Thank you for allowing me to participate in your care! ? ?I recommend that you always bring your medications to each appointment as this makes it easy to ensure you are on the correct medications and helps [place] not miss when refills are needed. ? ?Our plans for today:  ?- I prescribed you baclofen 5 mg (1/2 tablet). Start by taking this once at bed time. If you tolerate this well and it helps, you can take it up to twice daily. ?- Continue to use Voltaren gel up to 4 times a day as needed ? ?We are checking some labs today, I will call you if they are abnormal will send you a MyChart message or a letter if they are normal.  If you do not hear about your labs in the next 2 weeks please let us know. ? ?Take care and seek immediate care sooner if you develop any concerns.  ? ?Dr. Precious Gilding, DO ?[facility] ? ?

## 2022-03-03 NOTE — Assessment & Plan Note (Signed)
-  Baclofen 5 mg twice daily, patient instructed to take 5 mg at bedtime for a few nights and if he tolerates this well and it helps his pain he may start taking it twice daily ?-Continue using Voltaren gel, can use up to 4 times a day ?-Continue using ice and heat if helpful ?-Return if spasms worsen or do not get better with medication ?  ?

## 2022-03-03 NOTE — Assessment & Plan Note (Signed)
Hard fixated lump in groin could be scar tissue from previous vascular catheterization patient had in 2021 when right femoral artery was used for access.  We will also consider lymphoma in the differential. ?-CBC ?-Ultrasound of right groin ordered ?

## 2022-03-03 NOTE — Progress Notes (Addendum)
? ? ?  SUBJECTIVE:  ? ?CHIEF COMPLAINT / HPI:  ? ?Muscle spasms ?Have occurred for past 6 months ?Located in left thigh (worse with movement) and right inner thigh (worse at night). He describes the pain as sharp, burning and aching and is intermittent. So far has tried heat, ice, and Voltaren gel which helps a little.  ? ?Lump in right groin ? Has lump near right groin which is painful.  Has been present since he had peripheral vascular catheterization in April 2021.  States it has grown in size a little. ? ?Hypertension ?Patient takes benazepril 5 mg daily and nebivolol 20 mg daily ? ?Type 2 diabetes ?Patient takes metformin 1000 mg daily.  Last A1c 06/07/2021 of 6.3 ? ?PERTINENT  PMH / PSH: PAD, HTN, T2DM, history of limb ischemia s/p L AKA, GERD, constipation, OSA, tobacco abuse, iron deficiency anemia, depression ? ?OBJECTIVE:  ? ?Vitals:  ? 03/03/22 1508  ?BP: (!) 157/77  ?Pulse: 80  ?SpO2: 100%  ? ? ? ?General: NAD, pleasant, able to participate in exam ?Cardiac: RRR, no murmurs. ?Respiratory: CTAB, normal effort ?Extremities: S/p left BKA, tenderness to palpation of left BKA site, no edema or erythema of either leg ?Groin: Hard fixated mass approximately 3 inches long and 1 inch wide and right groin area ?Skin: warm and dry, no rashes noted ?Neuro: alert, no obvious focal deficits ?Psych: Normal affect and mood ? ?ASSESSMENT/PLAN:  ? ?Muscle spasms ?-Baclofen 5 mg twice daily, patient instructed to take 5 mg at bedtime for a few nights and if he tolerates this well and it helps his pain he may start taking it twice daily ?-Continue using Voltaren gel, can use up to 4 times a day ?-Continue using ice and heat if helpful ?-Return if spasms worsen or do not get better with medication in the next 1 to 2 weeks ?-BMP ?  ?Lump in groin ?Hard fixated lump in groin could be scar tissue from previous vascular catheterization patient had in 2021 when right femoral artery was used for access.  We will also consider  lymphoma in the differential. ?-CBC ?-Ultrasound of right groin ordered ? ?Hypertension ?Patient takes benazepril 5 mg daily and nebivolol 20 mg daily.  Blood pressure today of 157/77.  Patient states his blood pressures typically elevated at the beginning of the appointment.  Unfortunately, did not recheck blood pressure before patient left.  We will follow-up on blood pressure at next visit ?-BMP ? ?Type 2 diabetes ?Patient takes metformin 1000 mg BID.  Hemoglobin A1c today increased to 8.8 from 6.3 in June 2022.  Patient left before results were in.  I will call patient to discuss increase in A1c  ? ? ?Dr. Precious Gilding, DO ?[facility]  ? ? ? ? ?

## 2022-03-04 ENCOUNTER — Other Ambulatory Visit: Payer: Self-pay | Admitting: Student

## 2022-03-04 DIAGNOSIS — I10 Essential (primary) hypertension: Secondary | ICD-10-CM

## 2022-03-04 LAB — CBC WITH DIFFERENTIAL/PLATELET
Basophils Absolute: 0.1 10*3/uL (ref 0.0–0.2)
Basos: 1 %
EOS (ABSOLUTE): 0.2 10*3/uL (ref 0.0–0.4)
Eos: 2 %
Hematocrit: 36.8 % — ABNORMAL LOW (ref 37.5–51.0)
Hemoglobin: 10.2 g/dL — ABNORMAL LOW (ref 13.0–17.7)
Immature Grans (Abs): 0 10*3/uL (ref 0.0–0.1)
Immature Granulocytes: 0 %
Lymphocytes Absolute: 1.7 10*3/uL (ref 0.7–3.1)
Lymphs: 25 %
MCH: 17.7 pg — ABNORMAL LOW (ref 26.6–33.0)
MCHC: 27.7 g/dL — ABNORMAL LOW (ref 31.5–35.7)
MCV: 64 fL — ABNORMAL LOW (ref 79–97)
Monocytes Absolute: 0.8 10*3/uL (ref 0.1–0.9)
Monocytes: 11 %
Neutrophils Absolute: 4.2 10*3/uL (ref 1.4–7.0)
Neutrophils: 61 %
Platelets: 319 10*3/uL (ref 150–450)
RBC: 5.77 x10E6/uL (ref 4.14–5.80)
RDW: 21.1 % — ABNORMAL HIGH (ref 11.6–15.4)
WBC: 6.9 10*3/uL (ref 3.4–10.8)

## 2022-03-04 LAB — BASIC METABOLIC PANEL
BUN/Creatinine Ratio: 11 (ref 10–24)
BUN: 10 mg/dL (ref 8–27)
CO2: 22 mmol/L (ref 20–29)
Calcium: 9.5 mg/dL (ref 8.6–10.2)
Chloride: 100 mmol/L (ref 96–106)
Creatinine, Ser: 0.9 mg/dL (ref 0.76–1.27)
Glucose: 161 mg/dL — ABNORMAL HIGH (ref 70–99)
Potassium: 4 mmol/L (ref 3.5–5.2)
Sodium: 141 mmol/L (ref 134–144)
eGFR: 91 mL/min/{1.73_m2} (ref >59)

## 2022-03-07 ENCOUNTER — Other Ambulatory Visit: Payer: Self-pay | Admitting: Physician Assistant

## 2022-03-07 ENCOUNTER — Other Ambulatory Visit: Payer: Self-pay | Admitting: Student

## 2022-03-07 ENCOUNTER — Ambulatory Visit (HOSPITAL_COMMUNITY)
Admission: RE | Admit: 2022-03-07 | Discharge: 2022-03-07 | Disposition: A | Discharge status: Home / Self Care | Payer: Medicare HMO | Source: Ambulatory Visit | Attending: Family Medicine | Admitting: Family Medicine

## 2022-03-07 ENCOUNTER — Other Ambulatory Visit: Payer: Self-pay

## 2022-03-07 DIAGNOSIS — I10 Essential (primary) hypertension: Secondary | ICD-10-CM

## 2022-03-07 DIAGNOSIS — R1909 Other intra-abdominal and pelvic swelling, mass and lump: Secondary | ICD-10-CM | POA: Diagnosis not present

## 2022-03-07 DIAGNOSIS — K219 Gastro-esophageal reflux disease without esophagitis: Secondary | ICD-10-CM

## 2022-03-07 DIAGNOSIS — E7800 Pure hypercholesterolemia, unspecified: Secondary | ICD-10-CM

## 2022-03-07 MED ORDER — METFORMIN HCL 1000 MG PO TABS
ORAL_TABLET | ORAL | 1 refills | Status: DC
Start: 2022-03-07 — End: 2022-05-13

## 2022-03-08 ENCOUNTER — Telehealth: Payer: Self-pay | Admitting: Student

## 2022-03-08 ENCOUNTER — Other Ambulatory Visit: Payer: Self-pay | Admitting: *Deleted

## 2022-03-08 MED ORDER — CLOPIDOGREL BISULFATE 75 MG PO TABS: 75.0000 mg | ORAL_TABLET | Freq: Every day | ORAL | 0 refills | Status: DC

## 2022-03-08 NOTE — Telephone Encounter (Signed)
Spoke with pt on the phone morning of 03/07/22 about recent Hgb A1c of 8.8. Pt stated he had not been taking metformin as prescribed and only takes 1000 daily, not BID because thats how he thought he was supposed to take it. Pt agrees to add 1/2 tab in the evening for a week, if he tolerate this, he will increase to 1000 mg BID and we plan to recheck Hgb A1c in 3-4 months. ?

## 2022-03-11 ENCOUNTER — Telehealth: Payer: Self-pay | Admitting: Student

## 2022-03-11 NOTE — Telephone Encounter (Signed)
Called pt to discuss recent US results. Informed pt that lump in R groin area is likely a seroma that developed after his vascular cath and will hopefully become smaller with time. I told pt I spoke with the vascular surgeon who performed the cath which utilized the R femoral artery who advised against draining the fluid unless it becomes infected.  Pt understands to make an appointment if he develops fever, chills, increased tenderness, erythema or swelling of the area. Pt will return for an office visit in about 3-4 months and we will reassess the area.  ?

## 2022-03-15 ENCOUNTER — Other Ambulatory Visit: Payer: Self-pay

## 2022-03-15 ENCOUNTER — Ambulatory Visit (INDEPENDENT_AMBULATORY_CARE_PROVIDER_SITE_OTHER): Payer: Medicare HMO | Admitting: Family Medicine

## 2022-03-15 ENCOUNTER — Encounter: Payer: Self-pay | Admitting: Family Medicine

## 2022-03-15 VITALS — BP 136/75 | HR 76

## 2022-03-15 DIAGNOSIS — M7581 Other shoulder lesions, right shoulder: Secondary | ICD-10-CM

## 2022-03-15 DIAGNOSIS — W050XXA Fall from non-moving wheelchair, initial encounter: Secondary | ICD-10-CM | POA: Diagnosis not present

## 2022-03-15 NOTE — Patient Instructions (Addendum)
?I will work with our DME specialist to coordinate getting assistance to repair the issues with your wheelchair. ? ?Please see below for information and recommended exercises to help with your right shoulder pain.   ?I do recommend continuing to use a heating pad and keeping her shoulder and motion. ?  Please follow-up with [place] in 2-3 weeks. ? ?Rotator Cuff Tendinitis ?Rotator cuff tendinitis is inflammation of the tendons in the rotator cuff. Tendons are tough, cord-like bands that connect muscle to bone. The rotator cuff includes all of the muscles and tendons that connect the arm to the shoulder. The rotator cuff holds the head of the humerus, or the upper arm bone, in the cup of the shoulder blade (scapula). ?This condition can lead to a long-term or chronic tear. The tear may be partial or complete. ?What are the causes? ?This condition is usually caused by overusing the rotator cuff. ?What increases the risk? ?This condition is more likely to develop in athletes and workers who frequently use their shoulder or reach over their heads. This can include activities such as: ?Tennis. ?Baseball or softball. ?Swimming. ?Architect work. ?Painting. ?What are the signs or symptoms? ?Symptoms of this condition include: ?Pain that spreads (radiates) from the shoulder to the upper arm. ?Swelling and tenderness in front of the shoulder. ?Pain when reaching, pulling, or lifting the arm above the head. ?Pain when lowering the arm from above the head. ?Minor pain in the shoulder when resting. ?Increased pain in the shoulder at night. ?Difficulty placing the arm behind the back. ?How is this diagnosed? ?This condition is diagnosed with a physical exam and medical history. Tests may also be done, including: ?X-rays. ?MRI. ?Ultrasound. ?CT with or without contrast. ?How is this treated? ?Treatment for this condition depends on the severity of the condition. In less severe cases, treatment may include: ?Rest. This may be done  with a sling that holds the shoulder still (immobilization). Your health care provider may also recommend avoiding activities that involve lifting your arm over your head. ?Icing the shoulder. ?Anti-inflammatory medicines, such as aspirin or ibuprofen. ?In more severe cases, treatment may include: ?Physical therapy. ?Steroid injections. ?Surgery. ?Follow these instructions at home: ?If you have a sling: ?Wear the sling as told by your health care provider. Remove it only as told by your health care provider. ?Loosen it if your fingers tingle, become numb, or turn cold and blue. ?Keep it clean. ?If the sling is not waterproof: ?Do not let it get wet. ?Cover it with a watertight covering when you take a bath or shower. ?Managing pain, stiffness, and swelling ? ?If directed, put ice on the injured area. To do this: ?If you have a removable sling, remove it as told by your health care provider. ?Put ice in a plastic bag. ?Place a towel between your skin and the bag. ?Leave the ice on for 20 minutes, 2-3 times a day. ?Move your fingers often to reduce stiffness and swelling. ?Raise (elevate) the injured area above the level of your heart while you are lying down. ?Find a comfortable sleeping position, or sleep in a recliner, if available. ?Activity ?Rest your shoulder as told by your health care provider. ?Ask your health care provider when it is safe to drive if you have a sling on your arm. ?Return to your normal activities as told by your health care provider. Ask your health care provider what activities are safe for you. ?Do any exercises or stretches as told by your health  care provider or physical therapist. ?If you do repetitive overhead tasks, take small breaks in between and include stretching exercises as told by your health care provider. ?General instructions ?Do not use any products that contain nicotine or tobacco, such as cigarettes, e-cigarettes, and chewing tobacco. These can delay healing. If you need  help quitting, ask your health care provider. ?Take over-the-counter and prescription medicines only as told by your health care provider. ?Keep all follow-up visits as told by your health care provider. This is important. ?Contact a health care provider if: ?Your pain gets worse. ?You have new pain in your arm, hands, or fingers. ?Your pain is not relieved with medicine or does not get better after 6 weeks of treatment. ?You have crackling sensations when moving your shoulder in certain directions. ?You hear a snapping sound after using your shoulder, followed by severe pain and weakness. ?Get help right away if: ?Your arm, hand, or fingers are numb or tingling. ?Your arm, hand, or fingers are swollen or painful or they turn white or blue. ?Summary ?Rotator cuff tendinitis is inflammation of the tendons in the rotator cuff. Tendons are tough, cord-like bands that connect muscle to bone. ?This condition is usually caused by overusing the rotator cuff, which includes all of the muscles and tendons that connect the arm to the shoulder. ?This condition is more likely to develop in athletes and workers who frequently use their shoulder or reach over their heads. ?Treatment generally includes rest, anti-inflammatory medicines, and icing. In some cases, physical therapy and steroid injections may be needed. In severe cases, surgery may be needed. ?This information is not intended to replace advice given to you by your health care provider. Make sure you discuss any questions you have with your health care provider. ?Document Revised: 09/16/2019 Document Reviewed: 09/16/2019 ?Elsevier Patient Education ? [place] ? ?

## 2022-03-15 NOTE — Progress Notes (Signed)
? ? ?  SUBJECTIVE:  ? ?CHIEF COMPLAINT / HPI: wheelchair repair needs  ? ?Patient provided information from insurance company  ? ?[employer]  ?FAX [phone] ?Office Phone [phone] ? ?Patient has had chair for one year  ?Chair still moves in lock position ?This caused him to have a fall while coming out of the restroom ?Cushions are worn  ?Patient reports needing to put tape on arm rest due to plastic breaking ? ? ?Right Shoulder Pain  ?Right shoulder pain 2/2 to fall from wheelchair that moved while in locked position ?Stiff to lift to shoulder height  ?Denies any bruising or swelling  ?Has been taking tylenol  ?Has not iced the area but did use some heat  ?Fall happened 3 days ago  ?  ? ?PERTINENT  PMH / PSH: left amputee  ? ? ?OBJECTIVE:  ? ?BP 136/75   Pulse 76   SpO2 100%   ?Shoulder: ?Inspection reveals no obvious deformity, atrophy, or asymmetry. No bruising. No swelling ?Tenderness to palpation over [facility] joint, none over bicipital groove. ?Limited ROM in flexion, abduction, external rotation ?NV intact distally ? ?Special Tests:  ?- Impingement: Neg [place],+ empty can sign. ?- Supraspinatous: +empty can.  4/5 strength  ?- Infraspinatous/[place]: 4/5 strength with ER. + pain with resisted ER ?- Subscapularis: 5/5 strength with IR, Negative pain with resisted IR ? ? ?ASSESSMENT/PLAN:  ? ?Accidental fall from wheelchair ?Discussed with patient recommendation to call vendor for wheel chair to discuss repairs.  ?Will complete paperwork once received from vendor  ? ?Rotator cuff tendonitis, right ?Patient given handout with rehab exercises for shoulder  ?Also recommended applying heating pad to shoulder  ?F/u in 2-3 weeks ?  ? ? ?Eulis Foster, MD ?[place]  ?

## 2022-03-17 DIAGNOSIS — W050XXA Fall from non-moving wheelchair, initial encounter: Secondary | ICD-10-CM | POA: Insufficient documentation

## 2022-03-17 DIAGNOSIS — M7581 Other shoulder lesions, right shoulder: Secondary | ICD-10-CM | POA: Insufficient documentation

## 2022-03-17 NOTE — Assessment & Plan Note (Addendum)
Patient given handout with rehab exercises for shoulder  ?Also recommended applying heating pad to shoulder  ?F/u in 2-3 weeks ? ?

## 2022-03-17 NOTE — Assessment & Plan Note (Signed)
Discussed with patient recommendation to call vendor for wheel chair to discuss repairs.  ?Will complete paperwork once received from vendor  ?

## 2022-03-21 DIAGNOSIS — R69 Illness, unspecified: Secondary | ICD-10-CM | POA: Diagnosis not present

## 2022-03-21 DIAGNOSIS — Z8249 Family history of ischemic heart disease and other diseases of the circulatory system: Secondary | ICD-10-CM | POA: Diagnosis not present

## 2022-03-21 DIAGNOSIS — E1151 Type 2 diabetes mellitus with diabetic peripheral angiopathy without gangrene: Secondary | ICD-10-CM | POA: Diagnosis not present

## 2022-03-21 DIAGNOSIS — K219 Gastro-esophageal reflux disease without esophagitis: Secondary | ICD-10-CM | POA: Diagnosis not present

## 2022-03-21 DIAGNOSIS — Z833 Family history of diabetes mellitus: Secondary | ICD-10-CM | POA: Diagnosis not present

## 2022-03-21 DIAGNOSIS — R2681 Unsteadiness on feet: Secondary | ICD-10-CM | POA: Diagnosis not present

## 2022-03-21 DIAGNOSIS — K5900 Constipation, unspecified: Secondary | ICD-10-CM | POA: Diagnosis not present

## 2022-03-21 DIAGNOSIS — M1990 Unspecified osteoarthritis, unspecified site: Secondary | ICD-10-CM | POA: Diagnosis not present

## 2022-03-21 DIAGNOSIS — M545 Low back pain, unspecified: Secondary | ICD-10-CM | POA: Diagnosis not present

## 2022-03-21 DIAGNOSIS — I10 Essential (primary) hypertension: Secondary | ICD-10-CM | POA: Diagnosis not present

## 2022-03-21 DIAGNOSIS — E1142 Type 2 diabetes mellitus with diabetic polyneuropathy: Secondary | ICD-10-CM | POA: Diagnosis not present

## 2022-03-21 DIAGNOSIS — E785 Hyperlipidemia, unspecified: Secondary | ICD-10-CM | POA: Diagnosis not present

## 2022-03-22 DIAGNOSIS — R531 Weakness: Secondary | ICD-10-CM | POA: Diagnosis not present

## 2022-03-22 DIAGNOSIS — Z89612 Acquired absence of left leg above knee: Secondary | ICD-10-CM | POA: Diagnosis not present

## 2022-03-24 ENCOUNTER — Other Ambulatory Visit: Payer: Self-pay | Admitting: Student

## 2022-03-24 DIAGNOSIS — I10 Essential (primary) hypertension: Secondary | ICD-10-CM

## 2022-03-26 ENCOUNTER — Other Ambulatory Visit: Payer: Self-pay | Admitting: Student

## 2022-03-26 DIAGNOSIS — E7800 Pure hypercholesterolemia, unspecified: Secondary | ICD-10-CM

## 2022-04-13 ENCOUNTER — Other Ambulatory Visit: Payer: Self-pay | Admitting: Student

## 2022-04-22 ENCOUNTER — Other Ambulatory Visit: Payer: Self-pay | Admitting: Student

## 2022-04-22 DIAGNOSIS — E7800 Pure hypercholesterolemia, unspecified: Secondary | ICD-10-CM

## 2022-04-25 ENCOUNTER — Telehealth: Payer: Self-pay | Admitting: *Deleted

## 2022-04-25 NOTE — Telephone Encounter (Signed)
Received fax from [facility] following up on wheelchair request. ? ?Our records indicate that we faxed it over on 03/23/22 to [facility] and [employer].  ? ?[employer] and [employer], but they did not receive.   ? ?Verified faxed number and refaxed again. ? ?Christen Bame, CMA ? ?

## 2022-05-03 ENCOUNTER — Other Ambulatory Visit: Payer: Self-pay | Admitting: Vascular Surgery

## 2022-05-07 ENCOUNTER — Other Ambulatory Visit: Payer: Self-pay | Admitting: Student

## 2022-05-07 DIAGNOSIS — I10 Essential (primary) hypertension: Secondary | ICD-10-CM

## 2022-05-13 ENCOUNTER — Other Ambulatory Visit: Payer: Self-pay

## 2022-05-13 MED ORDER — METFORMIN HCL 1000 MG PO TABS: ORAL_TABLET | ORAL | 1 refills | Status: DC

## 2022-05-17 ENCOUNTER — Other Ambulatory Visit: Payer: Self-pay | Admitting: Student

## 2022-05-17 DIAGNOSIS — E7800 Pure hypercholesterolemia, unspecified: Secondary | ICD-10-CM

## 2022-05-31 ENCOUNTER — Encounter: Payer: Self-pay | Admitting: *Deleted

## 2022-06-03 ENCOUNTER — Other Ambulatory Visit: Payer: Self-pay | Admitting: Student

## 2022-06-04 ENCOUNTER — Other Ambulatory Visit: Payer: Self-pay | Admitting: Student

## 2022-06-12 ENCOUNTER — Other Ambulatory Visit: Payer: Self-pay | Admitting: Student

## 2022-06-12 DIAGNOSIS — E7800 Pure hypercholesterolemia, unspecified: Secondary | ICD-10-CM

## 2022-06-16 DIAGNOSIS — E11610 Type 2 diabetes mellitus with diabetic neuropathic arthropathy: Secondary | ICD-10-CM | POA: Diagnosis not present

## 2022-06-21 ENCOUNTER — Telehealth: Payer: Self-pay

## 2022-06-26 ENCOUNTER — Other Ambulatory Visit: Payer: Self-pay | Admitting: Student

## 2022-07-09 ENCOUNTER — Other Ambulatory Visit: Payer: Self-pay | Admitting: Student

## 2022-07-09 DIAGNOSIS — E7800 Pure hypercholesterolemia, unspecified: Secondary | ICD-10-CM

## 2022-07-24 ENCOUNTER — Other Ambulatory Visit: Payer: Self-pay | Admitting: Student

## 2022-08-04 ENCOUNTER — Other Ambulatory Visit: Payer: Self-pay | Admitting: Student

## 2022-08-04 DIAGNOSIS — E7800 Pure hypercholesterolemia, unspecified: Secondary | ICD-10-CM

## 2022-08-15 ENCOUNTER — Other Ambulatory Visit: Payer: Self-pay | Admitting: Student

## 2022-08-18 ENCOUNTER — Other Ambulatory Visit: Payer: Self-pay | Admitting: Student

## 2022-08-20 ENCOUNTER — Telehealth: Payer: Self-pay | Admitting: Student

## 2022-08-20 ENCOUNTER — Other Ambulatory Visit: Payer: Self-pay | Admitting: Student

## 2022-08-20 DIAGNOSIS — Z89612 Acquired absence of left leg above knee: Secondary | ICD-10-CM

## 2022-08-20 NOTE — Telephone Encounter (Signed)
Called patient to discuss paperwork I received requesting recent clinic notes specifically requesting power wheelchair.  Patient and I previously discussed this several months ago and decided against the power wheelchair so the patient would remain active.  However now patient is stating he is having issues with bilateral shoulder pain with the manual wheelchair and he feels he needs a power wheelchair.  Advised patient I will place a referral for neuro rehab for a power wheelchair evaluation.   Patient then told me he had recently been feeling short of breath and would like to come in for evaluation.  Scheduled patient for an appointment with access to care for 8/28.  ED precautions given.

## 2022-08-22 ENCOUNTER — Ambulatory Visit: Payer: Self-pay

## 2022-08-23 ENCOUNTER — Ambulatory Visit: Payer: Medicare HMO

## 2022-08-29 ENCOUNTER — Other Ambulatory Visit: Payer: Self-pay | Admitting: Student

## 2022-08-29 DIAGNOSIS — E7800 Pure hypercholesterolemia, unspecified: Secondary | ICD-10-CM

## 2022-09-04 ENCOUNTER — Other Ambulatory Visit: Payer: Self-pay | Admitting: Student

## 2022-09-04 DIAGNOSIS — E7800 Pure hypercholesterolemia, unspecified: Secondary | ICD-10-CM

## 2022-09-05 ENCOUNTER — Ambulatory Visit (INDEPENDENT_AMBULATORY_CARE_PROVIDER_SITE_OTHER): Payer: Medicare HMO | Admitting: Family Medicine

## 2022-09-05 ENCOUNTER — Ambulatory Visit
Admission: RE | Admit: 2022-09-05 | Discharge: 2022-09-05 | Disposition: A | Discharge status: Home / Self Care | Payer: Medicare HMO | Source: Ambulatory Visit | Attending: Family Medicine | Admitting: Family Medicine

## 2022-09-05 ENCOUNTER — Other Ambulatory Visit: Payer: Self-pay

## 2022-09-05 ENCOUNTER — Encounter: Payer: Self-pay | Admitting: Family Medicine

## 2022-09-05 VITALS — BP 150/75 | HR 75 | Wt 193.8 lb

## 2022-09-05 DIAGNOSIS — I10 Essential (primary) hypertension: Secondary | ICD-10-CM | POA: Diagnosis not present

## 2022-09-05 DIAGNOSIS — E1151 Type 2 diabetes mellitus with diabetic peripheral angiopathy without gangrene: Secondary | ICD-10-CM

## 2022-09-05 DIAGNOSIS — Z89612 Acquired absence of left leg above knee: Secondary | ICD-10-CM | POA: Diagnosis not present

## 2022-09-05 DIAGNOSIS — D500 Iron deficiency anemia secondary to blood loss (chronic): Secondary | ICD-10-CM | POA: Diagnosis not present

## 2022-09-05 DIAGNOSIS — I70209 Unspecified atherosclerosis of native arteries of extremities, unspecified extremity: Secondary | ICD-10-CM

## 2022-09-05 DIAGNOSIS — R0609 Other forms of dyspnea: Secondary | ICD-10-CM | POA: Diagnosis not present

## 2022-09-05 DIAGNOSIS — R0602 Shortness of breath: Secondary | ICD-10-CM | POA: Diagnosis not present

## 2022-09-05 LAB — POCT GLYCOSYLATED HEMOGLOBIN (HGB A1C): HbA1c, POC (controlled diabetic range): 7.4 % — AB (ref 0.0–7.0)

## 2022-09-05 MED ORDER — AMLODIPINE BESYLATE 5 MG PO TABS: 5.0000 mg | ORAL_TABLET | Freq: Every day | ORAL | 3 refills | Status: DC

## 2022-09-05 MED ORDER — ALBUTEROL SULFATE HFA 108 (90 BASE) MCG/ACT IN AERS: 2.0000 | INHALATION_SPRAY | Freq: Four times a day (QID) | RESPIRATORY_TRACT | 0 refills | Status: DC | PRN

## 2022-09-05 NOTE — Patient Instructions (Signed)
It was wonderful to see you today.  Please bring ALL of your medications with you to every visit.   Today we talked about:  -To have insurance approve your power electric wheelchair, please schedule your PT visits  For your breathing - We will check labs - Please schedule with Dr. Valentina Lucks at the front for lung testing  An x-ray was ordered for you---you do not need an appointment to have this completed.  I recommend going to [facility]   If the results are normal,I will send you a letter  I will call you with results if anything is abnormal    Your long term marker of diabetes is 7.4  Your blood pressure is high  I recommend starting amlodipine--you have been on this before with success   Please follow up in 1 months   Thank you for choosing [facility].   Please call [phone] with any questions about today's appointment.  Please be sure to schedule follow up at the front  desk before you leave today.   Dorris Singh, MD  Family Medicine

## 2022-09-05 NOTE — Assessment & Plan Note (Signed)
Blood pressure is not at goal.  He reports compliance.  No signs or symptoms of endorgan damage.  He is previously on amlodipine in combination with his ACE inhibitor which he tolerated well.  Restarted amlodipine 5 mg.  He is to continue his benazepril.

## 2022-09-05 NOTE — Assessment & Plan Note (Signed)
Repeat CBC and Ferritin today. Could be contributing to dyspnea.

## 2022-09-05 NOTE — Progress Notes (Signed)
SUBJECTIVE:   CHIEF COMPLAINT: wheelchair issue  HPI:   [name] is a 71 y.o.  with history notable for type 2 DM, below the knee amputation, PAD, HTN, and tobacco use (cigars) presenting for an electric wheelchair and dyspnea for 2 months.  The patient reports 2 months of dyspnea when he bends over or wheels his wheelchair. He reports audible wheezing. Denies coughing, fevers, orthopnea, PND, or chest pain. Reports a remote history of needing inhalers. Smoked 2 cigars daily for 20 years. Has had prior CT imaging demonstrating a small nodule (2019). Has a history of anemia, no longer taking iron. Last colonoscopy and EGD in 2021---CSY showed internal hemorrhoids and EGD showed no significant findings. Capsule endoscopy was recommended.   The patient has yet been evaluated for his electric wheelchair. He reports he was called by [facility] Rehab.  The patient reports compliance with his medications. Medication list updated. He denies headaches, chest pain, vision changes.      PERTINENT  PMH / PSH/Family/Social History :  updated problem list  OBJECTIVE:   BP (!) 150/75   Pulse 75   Wt 193 lb 12.8 oz (87.9 kg)   SpO2 99%   BMI 30.35 kg/m   Today's weight:  Last Weight  Most recent update: 09/05/2022  9:20 AM    Weight  87.9 kg (193 lb 12.8 oz)            Review of prior weights: [employer]   09/05/22 0919  Weight: 193 lb 12.8 oz (87.9 kg)     Cardiac: Regular rate and rhythm. Normal S1/S2. No murmurs, rubs, or gallops appreciated. Lungs: Bilateral wheezing in both lung fields on expiration, prolonged expiration, reduced air entry Sensate on R leg on monofilament L BKA present  Psych: Pleasant and appropriate    IMPRESSION: No acute finding to account for the patient's chest pain symptoms.   Left main and 3 vessel coronary artery disease.   Aortic atherosclerosis.   4 mm nodule of the left upper lobe. With a nodule of this size, the updated Fleischner  Society guidelines recommend no specific follow-up in a patient without risk factors for carcinoma development, or if the patient has risk factors, an optional 12 month noncontrast CT chest follow-up.  ASSESSMENT/PLAN:   Iron deficiency anemia Repeat CBC and Ferritin today. Could be contributing to dyspnea.   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  Referred to ophthalmology.  He has thickened toenails on the right foot.  Given his history of peripheral vascular disease and diabetic neuropathy referred to podiatry as he has already had a left BKA.  He is on an ACE inhibitor and a statin.  Continue metformin therapy.  Primary hypertension Blood pressure is not at goal.  He reports compliance.  No signs or symptoms of endorgan damage.  He is previously on amlodipine in combination with his ACE inhibitor which he tolerated well.  Restarted amlodipine 5 mg.  He is to continue his benazepril.   Dyspnea differential is broad but most likely this is related to his tobacco use and underlying lung disease.  Recommend chest x-ray and pulmonary function test.  Prescribed albuterol though suspect this is more of a COPD type illness.  Discussed cigar cessation.  He is precontemplative at this time.  He has tried patches and gums without success in the past.  He reports he thinks he is too old to quit.  Alternatives to  [place]/asthma include heart failure, coronary disease, anemia.  We will check brain nitric peptide today.  No chest pain suspicious for underlying acute coronary syndrome.  At follow up consider follow up CT for lung nodule   He is to follow up in 1 month for BP and follow up on his breathing     Dorris Singh, MD  [place]

## 2022-09-05 NOTE — Addendum Note (Signed)
Addended by: Owens Shark, Katurah Karapetian on: 09/05/2022 10:17 AM   Modules accepted: Orders

## 2022-09-05 NOTE — Assessment & Plan Note (Signed)
A1c is at goal.  Referred to ophthalmology.  He has thickened toenails on the right foot.  Given his history of peripheral vascular disease and diabetic neuropathy referred to podiatry as he has already had a left BKA.  He is on an ACE inhibitor and a statin.  Continue metformin therapy.

## 2022-09-06 ENCOUNTER — Telehealth: Payer: Self-pay | Admitting: Family Medicine

## 2022-09-06 DIAGNOSIS — D500 Iron deficiency anemia secondary to blood loss (chronic): Secondary | ICD-10-CM

## 2022-09-06 DIAGNOSIS — Z720 Tobacco use: Secondary | ICD-10-CM

## 2022-09-06 LAB — CBC
Hematocrit: 34.3 % — ABNORMAL LOW (ref 37.5–51.0)
Hemoglobin: 9.2 g/dL — ABNORMAL LOW (ref 13.0–17.7)
MCH: 16.8 pg — ABNORMAL LOW (ref 26.6–33.0)
MCHC: 26.8 g/dL — ABNORMAL LOW (ref 31.5–35.7)
MCV: 63 fL — ABNORMAL LOW (ref 79–97)
Platelets: 320 10*3/uL (ref 150–450)
RBC: 5.46 x10E6/uL (ref 4.14–5.80)
RDW: 21.3 % — ABNORMAL HIGH (ref 11.6–15.4)
WBC: 6.2 10*3/uL (ref 3.4–10.8)

## 2022-09-06 LAB — FERRITIN: Ferritin: 7 ng/mL — ABNORMAL LOW (ref 30–400)

## 2022-09-06 LAB — BRAIN NATRIURETIC PEPTIDE: BNP: 119 pg/mL — ABNORMAL HIGH (ref 0.0–100.0)

## 2022-09-06 MED ORDER — FERROUS SULFATE 325 (65 FE) MG PO TBEC: 325.0000 mg | DELAYED_RELEASE_TABLET | Freq: Every day | ORAL | 1 refills | Status: DC

## 2022-09-06 MED ORDER — NICOTINE POLACRILEX 2 MG MT GUM: 2.0000 mg | CHEWING_GUM | OROMUCOSAL | 3 refills | Status: DC | PRN

## 2022-09-06 NOTE — Telephone Encounter (Signed)
Called with results. - Discussed CXR findings, overall normal, recommend PFT, smoking cessation - Discussed anemia and IDA. Restart PO iron. Needs CBC and ferritin repeat at followup. New referral to GI placed, previously saw Dr. Collene Mares.  All questions answered. Reviewed return precautions.   Dorris Singh, MD  Family Medicine Teaching Service

## 2022-09-07 ENCOUNTER — Telehealth: Payer: Self-pay | Admitting: Family Medicine

## 2022-09-07 DIAGNOSIS — R0609 Other forms of dyspnea: Secondary | ICD-10-CM

## 2022-09-07 NOTE — Telephone Encounter (Signed)
Called patient. BNP mildly elevated, above cut-off for differentiation between COPD and HF. Given this elevation, will pursue echo. Called and discussed.  Nursing- please call patient and help to schedule echocardiogram.  Thank you! Dorris Singh, MD  Family Medicine Teaching Service

## 2022-09-12 ENCOUNTER — Other Ambulatory Visit: Payer: Self-pay | Admitting: Student

## 2022-09-12 DIAGNOSIS — I10 Essential (primary) hypertension: Secondary | ICD-10-CM

## 2022-09-15 ENCOUNTER — Ambulatory Visit (HOSPITAL_COMMUNITY)
Admission: RE | Admit: 2022-09-15 | Discharge: 2022-09-15 | Disposition: A | Discharge status: Home / Self Care | Payer: Medicare HMO | Source: Ambulatory Visit | Attending: Family Medicine | Admitting: Family Medicine

## 2022-09-15 DIAGNOSIS — R0609 Other forms of dyspnea: Secondary | ICD-10-CM

## 2022-09-15 NOTE — Progress Notes (Signed)
  Echocardiogram 2D Echocardiogram has been performed.  [name] M 09/15/2022, 8:40 AM

## 2022-09-16 ENCOUNTER — Other Ambulatory Visit: Payer: Self-pay | Admitting: Student

## 2022-09-18 LAB — ECHOCARDIOGRAM COMPLETE
Area-P 1/2: 2.99 cm2
S' Lateral: 3 cm

## 2022-09-19 ENCOUNTER — Other Ambulatory Visit: Payer: Self-pay | Admitting: Student

## 2022-09-19 ENCOUNTER — Encounter: Payer: Self-pay | Admitting: Family Medicine

## 2022-09-19 DIAGNOSIS — I10 Essential (primary) hypertension: Secondary | ICD-10-CM

## 2022-09-29 ENCOUNTER — Other Ambulatory Visit: Payer: Self-pay | Admitting: Student

## 2022-09-29 ENCOUNTER — Ambulatory Visit (INDEPENDENT_AMBULATORY_CARE_PROVIDER_SITE_OTHER): Payer: Medicare HMO | Admitting: Podiatry

## 2022-09-29 DIAGNOSIS — E1151 Type 2 diabetes mellitus with diabetic peripheral angiopathy without gangrene: Secondary | ICD-10-CM | POA: Diagnosis not present

## 2022-09-29 DIAGNOSIS — I739 Peripheral vascular disease, unspecified: Secondary | ICD-10-CM

## 2022-09-29 DIAGNOSIS — M79674 Pain in right toe(s): Secondary | ICD-10-CM

## 2022-09-29 DIAGNOSIS — I70209 Unspecified atherosclerosis of native arteries of extremities, unspecified extremity: Secondary | ICD-10-CM | POA: Diagnosis not present

## 2022-09-29 DIAGNOSIS — Z89612 Acquired absence of left leg above knee: Secondary | ICD-10-CM

## 2022-09-29 DIAGNOSIS — M79675 Pain in left toe(s): Secondary | ICD-10-CM | POA: Diagnosis not present

## 2022-09-29 DIAGNOSIS — E7800 Pure hypercholesterolemia, unspecified: Secondary | ICD-10-CM

## 2022-09-29 DIAGNOSIS — E1142 Type 2 diabetes mellitus with diabetic polyneuropathy: Secondary | ICD-10-CM | POA: Diagnosis not present

## 2022-09-29 DIAGNOSIS — B351 Tinea unguium: Secondary | ICD-10-CM | POA: Diagnosis not present

## 2022-09-29 NOTE — Progress Notes (Signed)
  Subjective:  Patient ID: ARMARI FUSSELL, male    DOB: 10-30-1951,  MRN: [id number]  Chief Complaint  Patient presents with   Toe Pain    Room 16  Pt reports he is having pain in his 1st toe on the right foot. He states pain is sharp, sometimes some burning, he states pain is mainly in foot does not radiate up to ankle. He gets a little relief when he elevates foot     71 y.o. male presents with the above complaint. History confirmed with patient.  Patient presents the office today for evaluation of painful nails on the right foot.  He has a history of a above-knee amputation of the lower extremity on the left side related to peripheral arterial disease.  He has painful thickened elongated and abnormally growing nails x5 present on the right foot.  He is unable to trim them himself due to the thickness.  He also wanted to see what is causing the nails to be discolored and grow abnormally.  Objective:  Physical Exam: warm, good capillary refill, nail exam onychomycosis of the toenails, onycholysis, and dystrophic nails x5 on the right foot, no trophic changes or ulcerative lesions.  DP and PT pulses nonpalpable.  Protective sensation is absent to the forefoot. Left Foot: Status post above-knee amputation Right Foot: No ulcerations or hyperkeratotic lesions present plantar right foot  No images are attached to the encounter.  Assessment:   1. Pain due to onychomycosis of toenails of both feet   2. PAD (peripheral artery disease) ([place])   3. 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 DM type 2 with diabetic peripheral neuropathy ([place])   5. S/P above knee amputation, left ([place])     Plan:  Patient was evaluated and treated and all questions answered.  Patient educated on diabetes. Discussed proper diabetic foot care and discussed risks and complications of disease. Educated patient in depth on reasons to return to the office immediately should he/she  discover anything concerning or new on the feet. All questions answered. Discussed proper shoes as well.   Onychomycosis with pain  -Nails palliatively debrided as below. -Educated on self-care  Procedure: Nail Debridement Rationale: Pain Type of Debridement: manual, sharp debridement. Instrumentation: Nail nipper, rotary burr. Number of Nails: 5  Return in about 3 months (around 12/30/2022) for [facility].         Everitt Amber, DPM [facility]

## 2022-10-05 ENCOUNTER — Telehealth: Payer: Self-pay

## 2022-10-05 ENCOUNTER — Other Ambulatory Visit: Payer: Self-pay | Admitting: *Deleted

## 2022-10-05 DIAGNOSIS — R0609 Other forms of dyspnea: Secondary | ICD-10-CM

## 2022-10-05 MED ORDER — ALBUTEROL SULFATE HFA 108 (90 BASE) MCG/ACT IN AERS: 2.0000 | INHALATION_SPRAY | Freq: Four times a day (QID) | RESPIRATORY_TRACT | 0 refills | Status: DC | PRN

## 2022-10-05 NOTE — Patient Outreach (Signed)
  Care Coordination   Initial Visit Note   10/05/2022 Name: [name] MRN: [id number] DOB: [birth year]  [name] is a 71 y.o. year old male who sees Precious Gilding, DO for primary care. I spoke with  Jearld Adjutant by phone today.  What matters to the patients health and wellness today?  Request SCAT brochure    Goals Addressed             This Visit's Progress    COMPLETED: Care Coordination Activities-No follow up required       Care Coordination Interventions: Advised patient to call physician to schedule annual wellness visit Patient request SCAT brochure       Reduce sugar intake to X grams per day   On track    Dilute sweet tea with half unsweet        SDOH assessments and interventions completed:  Yes     Care Coordination Interventions Activated:  Yes  Care Coordination Interventions:  Yes, provided   Follow up plan: No further intervention required.   Encounter Outcome:  Pt. Visit Completed   Jone Baseman, RN, MSN [facility] Care Management Coordinator Direct Line [phone]

## 2022-10-05 NOTE — Patient Instructions (Signed)
Visit Information  Thank you for taking time to visit with me today. Please don't hesitate to contact me if I can be of assistance to you.   Following are the goals we discussed today:   Goals Addressed             This Visit's Progress    COMPLETED: Care Coordination Activities-No follow up required       Care Coordination Interventions: Advised patient to call physician to schedule annual wellness visit Patient request SCAT brochure       Reduce sugar intake to X grams per day   On track    Dilute sweet tea with half unsweet        If you are experiencing a Mental Health or [place] or need someone to talk to, please call the Suicide and Crisis Lifeline: 988   The patient verbalized understanding of instructions, educational materials, and care plan provided today and agreed to receive a mailed copy of patient instructions, educational materials, and care plan.   No further follow up required: patient dceline  Jone Baseman, RN, MSN [facility] Care Management Coordinator Direct Line [phone]

## 2022-10-06 ENCOUNTER — Telehealth: Payer: Self-pay

## 2022-10-06 ENCOUNTER — Ambulatory Visit: Payer: Medicare HMO | Admitting: Pharmacist

## 2022-10-06 ENCOUNTER — Encounter: Payer: Self-pay | Admitting: Family Medicine

## 2022-10-06 ENCOUNTER — Ambulatory Visit (INDEPENDENT_AMBULATORY_CARE_PROVIDER_SITE_OTHER): Payer: Medicare HMO | Admitting: Family Medicine

## 2022-10-06 VITALS — BP 120/82 | HR 64 | Ht 67.0 in | Wt 195.0 lb

## 2022-10-06 DIAGNOSIS — Z89612 Acquired absence of left leg above knee: Secondary | ICD-10-CM | POA: Diagnosis not present

## 2022-10-06 DIAGNOSIS — E1151 Type 2 diabetes mellitus with diabetic peripheral angiopathy without gangrene: Secondary | ICD-10-CM

## 2022-10-06 DIAGNOSIS — R69 Illness, unspecified: Secondary | ICD-10-CM | POA: Diagnosis not present

## 2022-10-06 DIAGNOSIS — I10 Essential (primary) hypertension: Secondary | ICD-10-CM | POA: Diagnosis not present

## 2022-10-06 DIAGNOSIS — I70209 Unspecified atherosclerosis of native arteries of extremities, unspecified extremity: Secondary | ICD-10-CM | POA: Diagnosis not present

## 2022-10-06 DIAGNOSIS — D509 Iron deficiency anemia, unspecified: Secondary | ICD-10-CM | POA: Diagnosis not present

## 2022-10-06 DIAGNOSIS — F17200 Nicotine dependence, unspecified, uncomplicated: Secondary | ICD-10-CM

## 2022-10-06 NOTE — Assessment & Plan Note (Signed)
C/f GI bleed.  - Pt plans to schedule appt w/ [facility] today - Cont iron supplements (he reports taking)

## 2022-10-06 NOTE — Assessment & Plan Note (Signed)
BP now controlled 120/82, since restarting amlodipine. Pt is unsure if he is taking nebivolol.  - Cont current regimen of amlo, benazepril, and nebivolol - Asked pt to brings meds to next visit. Double check if pt is taking nebivolol.

## 2022-10-06 NOTE — Assessment & Plan Note (Signed)
-  F/u urine microalb/Cr ratio today - Cont metformin

## 2022-10-06 NOTE — Patient Instructions (Signed)
Good to see you today - Thank you for coming in  Things we discussed today:  1) Your blood pressure is well-controlled. This is great! Continue taking your current medications.  2) For your anemia, it is very important to schedule an appointment with the GI doctors.  - Continue taking your iron supplement  3) For your breathing, you seem to be doing better as well! - Let's get a CT scan of your lungs - Continue taking your albuterol. If you need it more than twice a day consistently, then come in for a visit to optimize your medications.  Please always bring your medication bottles  Come back to see me in 1-2 month to follow-up on your imaging and lab results.   [facility]: [phone] option 1

## 2022-10-06 NOTE — Telephone Encounter (Signed)
Attempted to contact patient regarding missed pharmacy clinic visit. However, patient did not answer and a HIPAA compliant voicemail was left requesting patient call the clinic to reschedule.   Joseph Art, Pharm.D. PGY-2 Ambulatory Care Pharmacy Resident 10/06/2022 9:55 AM

## 2022-10-06 NOTE — Progress Notes (Cosign Needed)
    SUBJECTIVE:   CHIEF COMPLAINT / HPI: BP, anemia, dyspnea  HD is a 71yo M w/ hx of T2DM, HTN, IDA, tobacco use disorder, BKA that p/f f/u of HTN, dyspnea, and anemia.  HTN Pt reports taking his medications as directed. He is unsure if he is taking nebivolol.   Dyspnea Pt reports improvement of his dyspnea since starting his albuterol inhaler. He uses it up to 1-2 times a day, especially if he is being active. He still reports smoking 1.5 cigars a day.  IDA Pt has not scheduled appt with GI yet, but plans to today. Denies blood in stool. He has been taking iron supplement as directed.   PERTINENT  PMH / PSH: as above.  OBJECTIVE:   BP 120/82   Pulse 64   Ht '5\' 7"'$  (1.702 m)   Wt 195 lb (88.5 kg)   SpO2 95%   BMI 30.54 kg/m   Gen: Alert, comfortably sitting in wheelchair. NAD.  HEENT: NCAT. MMM. Sclera anicteric.  CV: RRR Resp: End exp wheeze in L upper lung field. Normal WOB on RA.  ASSESSMENT/PLAN:   Primary hypertension BP now controlled 120/82, since restarting amlodipine. Pt is unsure if he is taking nebivolol.  - Cont current regimen of amlo, benazepril, and nebivolol - Asked pt to brings meds to next visit. Double check if pt is taking nebivolol.    Iron deficiency anemia C/f GI bleed.  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 Reports dyspnea improved with albuterol. Uses albuterol up to twice daily. On exam, end exp wheezing in L upper lung field (did not take albuterol today). - ordered low-dose CT for lung cancer screening  S/P above knee amputation, left ([place]) Pt has hx of L above knee amputation that requires new prosthetic supplies.  - Order sent for prosthetic supplies   F/u in 1-2 months to discuss results of CT, GI  visit/colonoscopy   Arlyce Dice, MD [place]

## 2022-10-06 NOTE — Assessment & Plan Note (Signed)
Smokes 1.5cigars daily. Reports dyspnea improved with albuterol. Uses albuterol up to twice daily. On exam, end exp wheezing in L upper lung field (did not take albuterol today). - ordered low-dose CT for lung cancer screening

## 2022-10-07 ENCOUNTER — Other Ambulatory Visit: Payer: Self-pay | Admitting: Student

## 2022-10-07 DIAGNOSIS — K219 Gastro-esophageal reflux disease without esophagitis: Secondary | ICD-10-CM

## 2022-10-07 LAB — MICROALBUMIN / CREATININE URINE RATIO
Creatinine, Urine: 94 mg/dL
Microalb/Creat Ratio: 9 mg/g creat (ref 0–29)
Microalbumin, Urine: 8.1 ug/mL

## 2022-10-11 ENCOUNTER — Telehealth: Payer: Self-pay

## 2022-10-11 NOTE — Telephone Encounter (Signed)
Patient given information for upcoming appointment.  CT Chest Low dose cancer screening 11/14/2022  1100 with arrival at [place] [phone]  Patient must cancel within 24 hours to avoid $75 fee.  [name], [place]

## 2022-10-17 ENCOUNTER — Encounter: Payer: Self-pay | Admitting: Family Medicine

## 2022-10-18 ENCOUNTER — Telehealth: Payer: Self-pay

## 2022-10-18 DIAGNOSIS — I779 Disorder of arteries and arterioles, unspecified: Secondary | ICD-10-CM

## 2022-10-18 NOTE — Telephone Encounter (Signed)
Pt called c/o "stump trouble".  Reviewed pt's chart, returned call for clarification, two identifiers used. Pt has c/o pain with taking on and off his prosthesis. He is also in constant pain with phantom pains. His f/u was not scheduled in July, so appts scheduled from the recall for first available. Confirmed understanding.

## 2022-10-20 DIAGNOSIS — H35033 Hypertensive retinopathy, bilateral: Secondary | ICD-10-CM | POA: Diagnosis not present

## 2022-10-20 DIAGNOSIS — H2513 Age-related nuclear cataract, bilateral: Secondary | ICD-10-CM | POA: Diagnosis not present

## 2022-10-20 DIAGNOSIS — H25013 Cortical age-related cataract, bilateral: Secondary | ICD-10-CM | POA: Diagnosis not present

## 2022-10-20 DIAGNOSIS — H40033 Anatomical narrow angle, bilateral: Secondary | ICD-10-CM | POA: Diagnosis not present

## 2022-10-20 DIAGNOSIS — E119 Type 2 diabetes mellitus without complications: Secondary | ICD-10-CM | POA: Diagnosis not present

## 2022-10-21 ENCOUNTER — Telehealth: Payer: Self-pay

## 2022-10-21 DIAGNOSIS — Z89612 Acquired absence of left leg above knee: Secondary | ICD-10-CM

## 2022-10-21 NOTE — Telephone Encounter (Signed)
Patient calls nurse line requesting shrinkers for above the knee amputation. Called and spoke with the [facility]. They will need an updated order for prosthetic supplies. They will also need an updated office visit note for insurance purposes.   Patient was seen on 10/12 with Dr. Markus Jarvis. They are requesting that this office note be addended to include that patient wears prosthetic supplies and is in need of replacement.   Will forward to Dr. Markus Jarvis.   Order and office note will need to be faxed to [phone].  Talbot Grumbling, RN

## 2022-10-23 NOTE — Progress Notes (Signed)
HISTORY AND PHYSICAL     CC:  follow up. Requesting Provider:  Erick Alley, DO  HPI: This is a 71 y.o. male who is here today for follow up for PAD.  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 Lenell Antu.  He has hx of right femoral endarterectomy for ischemic rest pain on 04/24/2020 by Dr. Chestine Spore and hx of right EIA stent with 7 mm x 29 mm VBX in 2018 by Dr. Edilia Bo.  Pt was last seen 01/11/2022 and at that time, he was having more left AKA pain and was evaluated by [facility].  He was working with PT with his prosthetic leg.  He did not fall on his stump.  He was not having any claudication, rest pain or non healing wounds on the right leg.   The pt returns today for follow up.  His biggest complaint today continues to be left AKA stump pain.  He states it is tender on the front as well as the back.  He states it has been this way since surgery.  He is not able to tolerate his prosthesis.  He has tried rubbing alcohol on his skin and states it somewhat helps.  He does not want any more surgery.  He is on low dose gabapentin and he states this only helps with nighttime pain.   As far as his right foot, he denies any rest pain or non healing wounds.  He states he does have some burning pains in his foot.  He states it feel weaker.    He denies any temporary blindness, unilateral weakness, numbness or paralysis or speech difficulties.   The pt is on a statin for cholesterol management.    The pt is not on an aspirin.    Other AC:  Plavix The pt is on CCB, BB, ACEI for hypertension.  The pt does  have diabetes. Tobacco hx:  some days  He is an [employer] and [employer].  Pt does not have family hx of AAA.  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 Venetia Maxon neurosurgery   Chronic lower  back pain    Claudication of both lower extremities (HCC) 07/15/2015   Critical lower limb ischemia (HCC) 12/19/2019   Diabetes mellitus    takes Metformin daily   GERD (gastroesophageal reflux disease)    takes Protonix daily   History of blood transfusion    "related to low HgB" ((09/10/2015   Hyperlipidemia    takes Vytorin daily   Hypertension    takes Benazepril and Bystolic daily   PAD (peripheral artery disease) (HCC)    Pneumonia    Radiculopathy, cervical region 02/11/2017   Shortness of breath dyspnea    with exertion   Tobacco user    Toe fracture, right 05/09/2011   Wears glasses     Past Surgical History:  Procedure Laterality Date   ABDOMINAL AORTOGRAM W/LOWER EXTREMITY N/A 12/11/2017   Procedure: ABDOMINAL AORTOGRAM W/LOWER EXTREMITY;  Surgeon: Chuck Hint, MD;  Location: [facility] INVASIVE CV LAB;  Service: Cardiovascular;  Laterality: N/A;   ABDOMINAL AORTOGRAM W/LOWER EXTREMITY Bilateral 04/22/2020   Procedure: ABDOMINAL AORTOGRAM W/LOWER EXTREMITY;  Surgeon: Cephus Shelling, MD;  Location: [facility] INVASIVE CV LAB;  Service: Cardiovascular;  Laterality: Bilateral;  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   AMPUTATION Left 02/19/2021   Procedure: LEFT ABOVE KNEE AMPUTATION;  Surgeon: Leonie Douglas, MD;  Location: [facility] OR;  Service: Vascular;  Laterality: Left;   ANTERIOR CERVICAL DECOMP/DISCECTOMY FUSION  03/08/12   C6-7   ANTERIOR CERVICAL DECOMP/DISCECTOMY FUSION  03/08/2012   Procedure: ANTERIOR CERVICAL DECOMPRESSION/DISCECTOMY FUSION 1 LEVEL/HARDWARE REMOVAL;  Surgeon: Maeola Harman, MD;  Location: [facility] NEURO ORS;  Service: Neurosurgery;  Laterality: N/A;  revison of C5-7 anterior cervical decompression with fusion with Cervical Five-Thoracic One anterior cervical decompression with fusion with interbody prothesis plating and bonegraft   BACK SURGERY  1996   BIOPSY  12/05/2020   Procedure: BIOPSY;  Surgeon: Meryl Dare, MD;  Location: [facility] ENDOSCOPY;   Service: Endoscopy;;   BYPASS GRAFT FEMORAL-PERONEAL Left 11/11/2016   Procedure: REDO LEFT FEMORAL-PERONEAL BYPASS WITH PROPATEN X 80CM GRAFT;  Surgeon: Chuck Hint, MD;  Location: [facility] OR;  Service: Vascular;  Laterality: Left;   COLONOSCOPY W/ BIOPSIES AND POLYPECTOMY  08/17/2012   f/u 5 years, 4 polyps, no high grade dysplasia or malignancy, tubular adenoma, hyperplastic polyops   COLONOSCOPY WITH PROPOFOL N/A 12/05/2020   Procedure: COLONOSCOPY WITH PROPOFOL;  Surgeon: Meryl Dare, MD;  Location: [facility] ENDOSCOPY;  Service: Endoscopy;  Laterality: N/A;   EMBOLECTOMY Left 12/19/2019   Procedure: LEFT FEMERAL- PERONEAL THROMBECTOMY;  Surgeon: Cephus Shelling, MD;  Location: [facility] OR;  Service: Vascular;  Laterality: Left;   ENDARTERECTOMY FEMORAL Right 04/24/2020   Procedure: RIGHT FEMORAL ENDARTERECTOMY;  Surgeon: Cephus Shelling, MD;  Location: [facility] OR;  Service: Vascular;  Laterality: Right;   ENTEROSCOPY N/A 12/11/2015   Procedure: ENTEROSCOPY;  Surgeon: Jeani Hawking, MD;  Location: [facility] ENDOSCOPY;  Service: Endoscopy;  Laterality: N/A;   ENTEROSCOPY N/A 03/29/2018   Procedure: ENTEROSCOPY;  Surgeon: Jeani Hawking, MD;  Location: [facility] ENDOSCOPY;  Service: Endoscopy;  Laterality: N/A;   ESOPHAGOGASTRODUODENOSCOPY  08/17/2012   normal esophagus and GEJ, diffuse gastritis with erythema- no malignancy, reactive gastropathy  with focal intestinal metaplasia   ESOPHAGOGASTRODUODENOSCOPY (EGD) WITH PROPOFOL N/A 12/05/2020   Procedure: ESOPHAGOGASTRODUODENOSCOPY (EGD) WITH PROPOFOL;  Surgeon: Meryl Dare, MD;  Location: [facility] ENDOSCOPY;  Service: Endoscopy;  Laterality: N/A;   FEMORAL-POPLITEAL BYPASS GRAFT Left 01/06/2016   Procedure: Left  COMMON FEMORAL-BELOW KNEE POPLITEAL ARTERY Bypass using non-reversed translocated saphenous vein graft from left leg;  Surgeon: Pryor Ochoa, MD;  Location: [facility] OR;  Service: Vascular;  Laterality: Left;   GIVENS CAPSULE STUDY N/A 11/24/2015    Procedure: GIVENS CAPSULE STUDY;  Surgeon: Charna Elizabeth, MD;  Location: [facility] ENDOSCOPY;  Service: Endoscopy;  Laterality: N/A;   HOT HEMOSTASIS N/A 03/29/2018   Procedure: HOT HEMOSTASIS (ARGON PLASMA COAGULATION/BICAP);  Surgeon: Jeani Hawking, MD;  Location: [facility] ENDOSCOPY;  Service: Endoscopy;  Laterality: N/A;   INGUINAL HERNIA REPAIR  1990's   right   INTRAOPERATIVE ARTERIOGRAM Left 01/06/2016   Procedure: INTRA OPERATIVE ARTERIOGRAM LEFT LOWER LEG;  Surgeon: Pryor Ochoa, MD;  Location: [facility] OR;  Service: Vascular;  Laterality: Left;   INTRAOPERATIVE ARTERIOGRAM Left 11/11/2016   Procedure: INTRA OPERATIVE ARTERIOGRAM LEFT LOWER EXTRIMITY;  Surgeon: Chuck Hint, MD;  Location: [facility] OR;  Service: Vascular;  Laterality: Left;   IR ANGIOGRAM FOLLOW UP STUDY  12/11/2017   IR GENERIC HISTORICAL  10/24/2016   IR ANGIOGRAM FOLLOW UP STUDY   LOWER EXTREMITY ANGIOGRAM Bilateral 07/30/2015   Procedure: Lower Extremity Angiogram;  Surgeon: Fransisco Hertz, MD;  Location: [facility]  INVASIVE CV LAB;  Service: Cardiovascular;  Laterality: Bilateral;   LUMBAR DISC SURGERY  1996   "lower"   PATCH ANGIOPLASTY Left 12/19/2019   Procedure: LEFT FEMORAL -PERONEAL PATCH ANGIOPLASTY WITH XENOSURE BIOLOGIC PATCH  ;  Surgeon: Cephus Shelling, MD;  Location: [facility] OR;  Service: Vascular;  Laterality: Left;   PATCH ANGIOPLASTY Right 04/24/2020   Procedure: Patch Angioplasty of Right Femoral Artery using Long Xenosure Biologic Patch 1cm x 14 cm;  Surgeon: Cephus Shelling, MD;  Location: [facility] OR;  Service: Vascular;  Laterality: Right;   PERIPHERAL VASCULAR CATHETERIZATION N/A 07/30/2015   Procedure: Abdominal Aortogram;  Surgeon: Fransisco Hertz, MD;  Location: [facility] INVASIVE CV LAB;  Service: Cardiovascular;  Laterality: N/A;   PERIPHERAL VASCULAR CATHETERIZATION N/A 10/24/2016   Procedure: Abdominal Aortogram;  Surgeon: Chuck Hint, MD;  Location: [facility] INVASIVE CV LAB;  Service: Cardiovascular;  Laterality: N/A;    PERIPHERAL VASCULAR CATHETERIZATION N/A 10/24/2016   Procedure: Lower Extremity Angiography;  Surgeon: Chuck Hint, MD;  Location: [facility] INVASIVE CV LAB;  Service: Cardiovascular;  Laterality: N/A;   PERIPHERAL VASCULAR INTERVENTION Right 12/11/2017   Procedure: PERIPHERAL VASCULAR INTERVENTION;  Surgeon: Chuck Hint, MD;  Location: [facility] INVASIVE CV LAB;  Service: Cardiovascular;  Laterality: Right;   VASCULAR SURGERY  ~ 2007   Stent SFA    VEIN HARVEST Left 01/06/2016   Procedure: LEFT GREATER SAPPHENOUS VEIN HARVEST;  Surgeon: Pryor Ochoa, MD;  Location: [facility] OR;  Service: Vascular;  Laterality: Left;    Allergies  Allergen Reactions   Glipizide Other (See Comments)    REACTION IS SIDE EFFECT Severe hypoglycemia to 40s.     Current Outpatient Medications  Medication Sig Dispense Refill   albuterol (VENTOLIN HFA) 108 (90 Base) MCG/ACT inhaler Inhale 2 puffs into the lungs every 6 (six) hours as needed for wheezing or shortness of breath. 8 g 0   amLODipine (NORVASC) 5 MG tablet Take 1 tablet (5 mg total) by mouth at bedtime. 90 tablet 3   atorvastatin (LIPITOR) 40 MG tablet TAKE 1 TABLET BY MOUTH EVERY DAY 90 tablet 1   benazepril (LOTENSIN) 5 MG tablet TAKE 1 TABLET BY MOUTH EVERY DAY 90 tablet 3   clopidogrel (PLAVIX) 75 MG tablet TAKE 1 TABLET BY MOUTH EVERY DAY 90 tablet 0   diclofenac Sodium (VOLTAREN) 1 % GEL Apply 2 g topically 4 (four) times daily. 50 g 1   ezetimibe (ZETIA) 10 MG tablet TAKE 1 TABLET BY MOUTH EVERY DAY 90 tablet 1   ferrous sulfate 325 (65 FE) MG EC tablet Take 1 tablet (325 mg total) by mouth daily with breakfast. 90 tablet 1   FLUoxetine (PROZAC) 10 MG capsule TAKE 1 CAPSULE BY MOUTH EVERY DAY (Patient not taking: Reported on 09/05/2022) 90 capsule 1   gabapentin (NEURONTIN) 300 MG capsule TAKE 1 CAPSULE BY MOUTH THREE TIMES A DAY AS NEEDED 90 capsule 1   metFORMIN (GLUCOPHAGE) 1000 MG tablet TAKE 1 TABLET BY MOUTH TWICE A DAY WITH MEALS  (Patient not taking: Reported on 09/05/2022) 180 tablet 1   Nebivolol HCl 20 MG TABS TAKE 1 TABLET BY MOUTH EVERY DAY 60 tablet 0   nicotine polacrilex (NICORETTE) 2 MG gum Take 1 each (2 mg total) by mouth as needed for smoking cessation. 100 tablet 3   pantoprazole (PROTONIX) 40 MG tablet TAKE 1 TABLET BY MOUTH EVERY DAY AS NEEDED 90 tablet 1   No current facility-administered medications for this visit.  Family History  Problem Relation Age of Onset   Cancer Mother        colon Cancer   Hyperlipidemia Mother    Diabetes Mother    Heart disease Father    Hypertension Father    Cancer Sister        Uterine   Diabetes Sister    Diabetes Brother     Social History   Socioeconomic History   Marital status: Media planner    Spouse name: Not on file   Number of children: 3   Years of education: 14   Highest education level: Some college, no degree  Occupational History   Not on file  Tobacco Use   Smoking status: Some Days    Types: Cigars    Passive exposure: Current   Smokeless tobacco: Never  Vaping Use   Vaping Use: Never used  Substance and Sexual Activity   Alcohol use: Yes    Alcohol/week: 2.0 standard drinks of alcohol    Types: 2 Cans of beer per week   Drug use: No   Sexual activity: Yes  Other Topics Concern   Not on file  Social History Narrative   Lives alone. Lives in an apartment. Ground floor apartment. Smoke alarms present, no grab bars.    Has a shih-tzu, named Missy.    Patient eats a variety of foods. Drinks tea, Kool-aid, coffee      Use public transportation or brother will take places.   Some food insecurity.      Social Determinants of Health   Financial Resource Strain: High Risk (11/08/2018)   Overall Financial Resource Strain (CARDIA)    Difficulty of Paying Living Expenses: Hard  Food Insecurity: Food Insecurity Present (11/08/2018)   Hunger Vital Sign    Worried About Running Out of Food in the Last Year: Sometimes true     Ran Out of Food in the Last Year: Sometimes true  Transportation Needs: No Transportation Needs (10/05/2022)   PRAPARE - Administrator, Civil Service (Medical): No    Lack of Transportation (Non-Medical): No  Physical Activity: Inactive (11/08/2018)   Exercise Vital Sign    Days of Exercise per Week: 0 days    Minutes of Exercise per Session: 0 min  Stress: No Stress Concern Present (11/08/2018)   [employer] of Occupational Health - Occupational Stress Questionnaire    Feeling of Stress : Not at all  Social Connections: Socially Integrated (11/08/2018)   Social Connection and Isolation Panel [NHANES]    Frequency of Communication with Friends and Family: Three times a week    Frequency of Social Gatherings with Friends and Family: Three times a week    Attends Religious Services: 1 to 4 times per year    Active Member of Clubs or Organizations: Yes    Attends Banker Meetings: 1 to 4 times per year    Marital Status: Living with partner  Intimate Partner Violence: Not At Risk (11/08/2018)   Humiliation, Afraid, Rape, and Kick questionnaire    Fear of Current or Ex-Partner: No    Emotionally Abused: No    Physically Abused: No    Sexually Abused: No     REVIEW OF SYSTEMS:   [X]  denotes positive finding, [ ]  denotes negative finding Cardiac  Comments:  Chest pain or chest pressure:    Shortness of breath upon exertion:    Short of breath when lying flat:    Irregular heart rhythm:  Vascular    Pain in calf, thigh, or hip brought on by ambulation:    Pain in feet at night that wakes you up from your sleep:     Blood clot in your veins:    Leg swelling:         Pulmonary    Oxygen at home:    Productive cough:     Wheezing:         Neurologic    Sudden weakness in arms or legs:     Sudden numbness in arms or legs:     Sudden onset of difficulty speaking or slurred speech:    Temporary loss of vision in one eye:     Problems with  dizziness:         Gastrointestinal    Blood in stool:     Vomited blood:         Genitourinary    Burning when urinating:     Blood in urine:        Psychiatric    Major depression:         Hematologic    Bleeding problems:    Problems with blood clotting too easily:        Skin    Rashes or ulcers:        Constitutional    Fever or chills:      PHYSICAL EXAMINATION:  Today's Vitals   10/24/22 1230  Pulse: 64  Resp: 20  SpO2: 98%  Height: 5\' 7"  (1.702 m)  PainSc: 8   PainLoc: Leg   Body mass index is 30.54 kg/m.   General:  WDWN in NAD; vital signs documented above Gait: Not observed HENT: WNL, normocephalic Pulmonary: normal non-labored breathing , without wheezing Cardiac: regular HR, with carotid bruits bilaterally with right > left Abdomen: soft, NT; aortic pulse is not palpable Skin: without rashes Vascular Exam/Pulses: Brisk right PT doppler flow; brisk right peroneal flow.  AT absent.   Extremities: right foot without ischemic changes, without Gangrene , without cellulitis; without open wounds; left AKA stump appears healthy with good muscle coverage.  Tender to palpation circumferentially;  no erythema. Musculoskeletal: no muscle wasting or atrophy  Neurologic: A&O X 3 Psychiatric:  The pt has Normal affect.   Non-Invasive Vascular Imaging:   ABI's/TBI's on 10/24/2022: Right:  0.95/0.39 - Great toe pressure: 32 Left:  AKA   Previous ABI's/TBI's on 01/11/2022: Right:  0.40/0 - Great toe pressure: 0 Left:  AKA     ASSESSMENT/PLAN:: 71 y.o. male here for follow up for PAD with hx of left AKA on 02/19/2021 by Dr. Lenell Antu.  He has hx of right femoral endarterectomy for ischemic rest pain on 04/24/2020 by Dr. Chestine Spore and hx of right EIA stent with 7 mm x 29 mm VBX in 2018 by Dr. Edilia Bo.  PAD -pt right foot with brisk doppler flow PT and peroneal.  His great toe pressure is 32.  He does not have rest pain or non healing wounds.   -his left AKA stump  continues to have tenderness and he is having more phantom pain.   -given he is on low dose gabapentin, his PCP may have room to increase his dosage and hopeful he will get relief in his left AKA stump as well as what sounds like some neuropathic pain in the right foot.   -he was given an Rx for [facility] today for shrinker sock  Carotid bruits -pt remains asymptomatic.  He does have a harsh  bruit on the right > left.  He did have a carotid duplex about a year ago and it revealed 1-39%.  Will check this again at his next visit to make sure he has not had any progression of carotid stenosis.   He knows that if he develops any issues to report to the ER.    -continue statin/plavix  -pt will f/u in 3 months with ABI, right aortoiliac duplex and carotid duplex on Dr. Chestine Spore clinic day with PA. He will call sooner if he has any issues before then.   Doreatha Massed, [facility], [place] [facility] [phone]  Clinic MD:   Myra Gianotti

## 2022-10-24 ENCOUNTER — Ambulatory Visit (INDEPENDENT_AMBULATORY_CARE_PROVIDER_SITE_OTHER): Payer: Medicare HMO | Admitting: Physician Assistant

## 2022-10-24 ENCOUNTER — Ambulatory Visit (HOSPITAL_COMMUNITY)
Admission: RE | Admit: 2022-10-24 | Discharge: 2022-10-24 | Disposition: A | Discharge status: Home / Self Care | Payer: Medicare HMO | Source: Ambulatory Visit | Attending: Surgery | Admitting: Surgery

## 2022-10-24 VITALS — HR 64 | Resp 20 | Ht 67.0 in

## 2022-10-24 DIAGNOSIS — I779 Disorder of arteries and arterioles, unspecified: Secondary | ICD-10-CM

## 2022-10-24 DIAGNOSIS — I739 Peripheral vascular disease, unspecified: Secondary | ICD-10-CM

## 2022-10-24 DIAGNOSIS — S78112A Complete traumatic amputation at level between left hip and knee, initial encounter: Secondary | ICD-10-CM

## 2022-10-24 DIAGNOSIS — R0989 Other specified symptoms and signs involving the circulatory and respiratory systems: Secondary | ICD-10-CM | POA: Diagnosis not present

## 2022-10-25 NOTE — Assessment & Plan Note (Signed)
Pt has hx of L above knee amputation that requires new prosthetic supplies.  - Order sent for prosthetic supplies

## 2022-10-26 ENCOUNTER — Other Ambulatory Visit: Payer: Self-pay

## 2022-10-26 ENCOUNTER — Ambulatory Visit: Payer: Medicare HMO | Attending: Family Medicine

## 2022-10-26 DIAGNOSIS — M25512 Pain in left shoulder: Secondary | ICD-10-CM | POA: Insufficient documentation

## 2022-10-26 DIAGNOSIS — R2689 Other abnormalities of gait and mobility: Secondary | ICD-10-CM | POA: Diagnosis not present

## 2022-10-26 DIAGNOSIS — M25511 Pain in right shoulder: Secondary | ICD-10-CM | POA: Diagnosis not present

## 2022-10-26 DIAGNOSIS — G8929 Other chronic pain: Secondary | ICD-10-CM | POA: Diagnosis not present

## 2022-10-26 DIAGNOSIS — I70229 Atherosclerosis of native arteries of extremities with rest pain, unspecified extremity: Secondary | ICD-10-CM

## 2022-10-26 DIAGNOSIS — R0989 Other specified symptoms and signs involving the circulatory and respiratory systems: Secondary | ICD-10-CM

## 2022-10-26 DIAGNOSIS — I739 Peripheral vascular disease, unspecified: Secondary | ICD-10-CM

## 2022-10-26 NOTE — Therapy (Signed)
OUTPATIENT PHYSICAL THERAPY WHEELCHAIR EVALUATION   Patient Name: [name] MRN: [id number] DOB:Mar 11, 1951, 71 y.o., male Today's Date: 10/26/2022   PT End of Session - 10/26/22 1132     Visit Number 1    Number of Visits 1    PT Start Time 1100    PT Stop Time 1130    PT Time Calculation (min) 30 min    Equipment Utilized During Treatment Gait belt    Activity Tolerance Patient tolerated treatment well    Behavior During Therapy [facility] for tasks assessed/performed             Past Medical History:  Diagnosis Date   Adhesive capsulitis 03/10/2020   Anemia    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 Vertell Limber neurosurgery   Chronic lower back pain    Claudication of both lower extremities ([place]) 07/15/2015   Critical lower limb ischemia ([place]) 12/19/2019   Diabetes mellitus    takes Metformin daily   GERD (gastroesophageal reflux disease)    takes Protonix daily   History of blood transfusion    "related to low HgB" ((09/10/2015   Hyperlipidemia    takes Vytorin daily   Hypertension    takes Benazepril and Bystolic daily   PAD (peripheral artery disease) ([place])    Pneumonia    Radiculopathy, cervical region 02/11/2017   Shortness of breath dyspnea    with exertion   Tobacco user    Toe fracture, right 05/09/2011   Wears glasses    Past Surgical History:  Procedure Laterality Date   ABDOMINAL AORTOGRAM W/LOWER EXTREMITY N/A 12/11/2017   Procedure: ABDOMINAL AORTOGRAM W/LOWER EXTREMITY;  Surgeon: Angelia Mould, MD;  Location: [place] [facility];  Service: Cardiovascular;  Laterality: N/A;   ABDOMINAL AORTOGRAM W/LOWER EXTREMITY Bilateral 04/22/2020   Procedure: ABDOMINAL AORTOGRAM W/LOWER EXTREMITY;  Surgeon: Marty Heck, MD;  Location: [place] [facility];  Service: Cardiovascular;  Laterality: Bilateral;   ABOVE KNEE LEG AMPUTATION Left  02/19/2021   AMPUTATION Left 02/19/2021   Procedure: LEFT ABOVE KNEE AMPUTATION;  Surgeon: Cherre Robins, MD;  Location: [place];  Service: Vascular;  Laterality: Left;   ANTERIOR CERVICAL DECOMP/DISCECTOMY FUSION  03/08/12   C6-7   ANTERIOR CERVICAL DECOMP/DISCECTOMY FUSION  03/08/2012   Procedure: ANTERIOR CERVICAL DECOMPRESSION/DISCECTOMY FUSION 1 LEVEL/HARDWARE REMOVAL;  Surgeon: Erline Levine, MD;  Location: [place] [facility];  Service: Neurosurgery;  Laterality: N/A;  revison of C5-7 anterior cervical decompression with fusion with Cervical Five-Thoracic One anterior cervical decompression with fusion with interbody prothesis plating and bonegraft   [place]   BIOPSY  12/05/2020   Procedure: BIOPSY;  Surgeon: Ladene Artist, MD;  Location: [place];  Service: Endoscopy;;   BYPASS GRAFT FEMORAL-PERONEAL Left 11/11/2016   Procedure: REDO LEFT FEMORAL-PERONEAL BYPASS WITH PROPATEN 6MM X 80CM GRAFT;  Surgeon: Angelia Mould, MD;  Location: [place];  Service: Vascular;  Laterality: Left;   COLONOSCOPY W/ BIOPSIES AND POLYPECTOMY  08/17/2012   f/u 5 years, 4 polyps, no high grade dysplasia or malignancy, tubular adenoma, hyperplastic polyops   COLONOSCOPY WITH PROPOFOL N/A 12/05/2020   Procedure: COLONOSCOPY WITH PROPOFOL;  Surgeon: Ladene Artist, MD;  Location: [facility];  Service: Endoscopy;  Laterality: N/A;   EMBOLECTOMY Left 12/19/2019   Procedure: LEFT FEMERAL- PERONEAL THROMBECTOMY;  Surgeon: Marty Heck, MD;  [facility];  Service: Vascular;  Laterality: Left;   ENDARTERECTOMY FEMORAL Right 04/24/2020   Procedure: RIGHT FEMORAL ENDARTERECTOMY;  Surgeon: Marty Heck, MD;  Location: [place];  Service: Vascular;  Laterality: Right;   ENTEROSCOPY N/A 12/11/2015   Procedure: ENTEROSCOPY;  Surgeon: Carol Ada, MD;  Location: [place];  Service: Endoscopy;  Laterality: N/A;   ENTEROSCOPY N/A 03/29/2018   Procedure: ENTEROSCOPY;  Surgeon: Carol Ada,  MD;  Location: [place];  Service: Endoscopy;  Laterality: N/A;   ESOPHAGOGASTRODUODENOSCOPY  08/17/2012   normal esophagus and GEJ, diffuse gastritis with erythema- no malignancy, reactive gastropathy  with focal intestinal metaplasia   ESOPHAGOGASTRODUODENOSCOPY (EGD) WITH PROPOFOL N/A 12/05/2020   Procedure: ESOPHAGOGASTRODUODENOSCOPY (EGD) WITH PROPOFOL;  Surgeon: Ladene Artist, MD;  Location: [facility];  Service: Endoscopy;  Laterality: N/A;   FEMORAL-POPLITEAL BYPASS GRAFT Left 01/06/2016   Procedure: Left  COMMON FEMORAL-BELOW KNEE POPLITEAL ARTERY Bypass using non-reversed translocated saphenous vein graft from left leg;  Surgeon: Mal Misty, MD;  Location: [place];  Service: Vascular;  Laterality: Left;   GIVENS CAPSULE STUDY N/A 11/24/2015   Procedure: GIVENS CAPSULE STUDY;  Surgeon: Juanita Craver, MD;  Location: [place];  Service: Endoscopy;  Laterality: N/A;   HOT HEMOSTASIS N/A 03/29/2018   Procedure: HOT HEMOSTASIS (ARGON PLASMA COAGULATION/BICAP);  Surgeon: Carol Ada, MD;  Location: [place];  Service: Endoscopy;  Laterality: N/A;   INGUINAL HERNIA REPAIR  1990's   right   INTRAOPERATIVE ARTERIOGRAM Left 01/06/2016   Procedure: INTRA OPERATIVE ARTERIOGRAM LEFT LOWER LEG;  Surgeon: Mal Misty, MD;  Location: [place];  Service: Vascular;  Laterality: Left;   INTRAOPERATIVE ARTERIOGRAM Left 11/11/2016   Procedure: INTRA OPERATIVE ARTERIOGRAM LEFT LOWER EXTRIMITY;  Surgeon: Angelia Mould, MD;  Location: [place];  Service: Vascular;  Laterality: Left;   IR ANGIOGRAM FOLLOW UP STUDY  12/11/2017   IR GENERIC HISTORICAL  10/24/2016   IR ANGIOGRAM FOLLOW UP STUDY   LOWER EXTREMITY ANGIOGRAM Bilateral 07/30/2015   Procedure: Lower Extremity Angiogram;  Surgeon: Conrad [place], MD;  Location: [place] CV LAB;  Service: Cardiovascular;  Laterality: Bilateral;   [place]   "lower"   PATCH ANGIOPLASTY Left 12/19/2019   Procedure: LEFT FEMORAL  -PERONEAL PATCH ANGIOPLASTY WITH XENOSURE BIOLOGIC PATCH  ;  Surgeon: Marty Heck, MD;  Location: [place];  Service: Vascular;  Laterality: Left;   PATCH ANGIOPLASTY Right 04/24/2020   Procedure: Patch Angioplasty of Right Femoral Artery using Long Xenosure Biologic Patch 1cm x 14 cm;  Surgeon: Marty Heck, MD;  Location: [place];  Service: Vascular;  Laterality: Right;   PERIPHERAL VASCULAR CATHETERIZATION N/A 07/30/2015   Procedure: Abdominal Aortogram;  Surgeon: Conrad [place], MD;  Location: [place] [facility];  Service: Cardiovascular;  Laterality: N/A;   PERIPHERAL VASCULAR CATHETERIZATION N/A 10/24/2016   Procedure: Abdominal Aortogram;  Surgeon: Angelia Mould, MD;  Location: [place] [facility];  Service: Cardiovascular;  Laterality: N/A;   PERIPHERAL VASCULAR CATHETERIZATION N/A 10/24/2016   Procedure: Lower Extremity Angiography;  Surgeon: Angelia Mould, MD;  Location: [place] [facility];  Service: Cardiovascular;  Laterality: N/A;   PERIPHERAL VASCULAR INTERVENTION Right 12/11/2017   Procedure: PERIPHERAL VASCULAR INTERVENTION;  Surgeon: Angelia Mould, MD;  Location: [place] [facility];  Service: Cardiovascular;  Laterality: Right;   VASCULAR SURGERY  ~ 2007   Stent SFA    VEIN HARVEST Left 01/06/2016   Procedure: LEFT GREATER SAPPHENOUS VEIN  HARVEST;  Surgeon: Mal Misty, MD;  Location: [place];  Service: Vascular;  Laterality: Left;   Patient Active Problem List   Diagnosis Date Noted   Fatty liver 03/30/2021   Imaging of gastrointestinal tract abnormal 03/30/2021   Personal history of colonic polyps 03/30/2021   S/P above knee amputation, left ([place]) 02/19/2021   Lung nodule < 6cm on CT 01/28/2019   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 Dorris Singh  REFERRING PROVIDER: Dr. Chrisandra Netters  THERAPY DIAG:  Other abnormalities of gait and mobility  Chronic right shoulder pain  Chronic left shoulder pain  Rationale for Evaluation and Treatment Rehabilitation  SUBJECTIVE:                                                                                                                                                                                           SUBJECTIVE STATEMENT: Pt present or wheelchair evaluation. Pt had L above knee amputation about 2 years ago. Pt has prosthetic leg but is having problems with it. Pt is using a manual wheelchair as main means of mobility. Pt has had 2 falls in past 6 months and has rotator cuff issues in bil UE.   PRECAUTIONS: Fall  WEIGHT BEARING RESTRICTIONS No  PAIN:  Are you having pain? Yes: NPRS scale: 8-10/10 (shoulders and L residual leg) Pain location: bil shoulders Pain description: sharp Aggravating factors: using wheelchair Relieving factors: rest   OCCUPATION: disability  PLOF: Requires assistive device for independence and Needs assistance with gait  PATIENT GOALS get a chair to improve indoor and outdoor mobility         MEDICAL HISTORY:  Primary diagnosis onset: 02/06/2021 (date of surgery)  Diagnosis  Code:  Diagnosis:    Diagnosis code:       Diagnosis:  E31.540 (ICD-10-CM) - S/P above knee amputation, left (HCC) Diagnosis  Code:  Diagnosis:   '[]'$ Progressive disease  Relevant future surgeries:     Height: 5'7" Weight: 190 lbs Explain recent changes or trends in weight:      History:  Past Medical History:  Diagnosis Date   Adhesive capsulitis 03/10/2020   Anemia    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 Vertell Limber neurosurgery   Chronic lower  back pain     Claudication of both lower extremities ([place]) 07/15/2015   Critical lower limb ischemia ([place]) 12/19/2019   Diabetes mellitus    takes Metformin daily   GERD (gastroesophageal reflux disease)    takes Protonix daily   History of blood transfusion    "related to low HgB" ((09/10/2015   Hyperlipidemia    takes Vytorin daily   Hypertension    takes Benazepril and Bystolic daily   PAD (peripheral artery disease) (HCC)    Pneumonia    Radiculopathy, cervical region 02/11/2017   Shortness of breath dyspnea    with exertion   Tobacco user    Toe fracture, right 05/09/2011   Wears glasses             Cardio Status:  Functional Limitations:   '[]'$ Intact  '[x]'$  Impaired    SOB with overexertion  Respiratory Status:  Functional Limitations:   '[]'$ Intact  '[]'$ Impaired   '[x]'$ SOB '[]'$ COPD '[]'$ O2 Dependent ______LPM  '[]'$ Ventilator Dependent  Resp equip:                                                     Objective Measure(s):   Orthotics:   '[x]'$ Amputee:       poor circulation, L above knee amputation                                                      '[x]'$ Prosthesis:        HOME ENVIRONMENT:  '[x]'$ House '[]'$ Condo/town home '[]'$ Apartment '[]'$ Asst living '[]'$ LTCF         '[x]'$ Own  '[]'$ Rent   '[]'$ Lives alone '[x]'$ Lives with others -     wife                        Hours without assistance: 0  '[x]'$ Home is accessible to patient                                 Storage of wheelchair:  '[x]'$ In home   '[]'$ Other Comments:        COMMUNITY :  TRANSPORTATION:  '[x]'$ Car '[]'$ Van '[]'$ Public Transportation '[]'$ Adapted w/c Lift '[]'$  Ambulance '[]'$ Other:                     '[]'$ Sits in wheelchair during transport   Where is w/c stored during transport?  '[]'$ [place]  '[]'$  [employer]  r   '[x]'$ Self-Driver       Drive while in  [employer] '[]'$ yes '[x]'$ no   Employment and/or school:  Specific requirements pertaining to mobility        Other:  COMMUNICATION:  Verbal Communication  '[x]'$ WFL '[]'$ receptive '[]'$ WFL '[]'$ expressive '[]'$ Understandable  '[]'$ Difficult to understand   '[]'$ non-communicative  Primary Language:_English_____________ 2nd:_____________  Communication provided by:'[x]'$ Patient '[]'$ Family '[]'$ Caregiver '[]'$ Translator   '[]'$ Uses an augmentative communication device     Manufacturer/Model :  MOBILITY/BALANCE:  Sitting Balance  Standing Balance  Transfers  Ambulation   '[x]'$ WFL      '[]'$ WFL  '[x]'$ Independent  '[]'$  Independent   '[]'$ Uses UE for balance in sitting Comments:  '[x]'$ Uses UE/device for stability Comments:  '[]'$  Min assist  '[]'$  Ambulates independently with       device:___________________      '[]'$  Mod assist  '[x]'$  Able to ambulate ___<10___ feet        safely/functionally/independently   '[]'$  Min assist  '[]'$  Min assist  '[]'$  Max assist  '[]'$  Non-functional ambulator         History/High risk of falls   '[]'$  Mod assist  '[]'$  Mod assist  '[]'$  Dependent  '[]'$  Unable to ambulate   '[]'$  Max  assist  '[]'$  Max assist  Transfer method:'[]'$ 1 person '[]'$ 2 person '[]'$ sliding board '[]'$ squat pivot '[]'$ stand pivot '[]'$ mechanical patient lift  '[]'$ other:   '[]'$  Unable  '[]'$  Unable    Fall History: # of falls in the past 6 months? 2 # of "near" falls in the past 6 months? 12    CURRENT SEATING / MOBILITY:  Current Mobility Device: '[]'$ None '[]'$ Cane/Walker '[x]'$ Manual '[]'$ Dependent '[]'$ Dependent w/ Tilt rScooter  '[]'$ Power (type of control):   Manufacturer:  Model:  Serial #:   Size:  Color:  Age:   Purchased by whom:   Current condition of mobility base:    Current seating system:                                                                       Age of seating system:  1.5 years  Describe posture in present seating system:    Is the current mobility meeting medical necessity?:  '[]'$ Yes '[x]'$ No Describe:                                     Ability to complete Mobility-Related Activities of Daily Living (MRADL's) with Current Mobility Device:   Move room to room  '[x]'$ Independent  '[]'$ Min '[]'$ Mod '[]'$ Max assist  '[]'$ Unable  Comments:   Meal prep  '[x]'$ Independent   '[]'$ Min '[]'$ Mod '[]'$ Max assist  '[]'$ Unable    Feeding  '[x]'$ Independent  '[]'$ Min '[]'$ Mod '[]'$ Max assist  '[]'$ Unable    Bathing  '[x]'$ Independent  '[]'$ Min '[]'$ Mod '[]'$ Max assist  '[]'$ Unable    Grooming  '[x]'$ Independent  '[]'$ Min '[]'$ Mod '[]'$ Max assist  '[]'$ Unable    UE dressing  '[x]'$ Independent  '[]'$ Min '[]'$ Mod '[]'$ Max assist  '[]'$ Unable    LE dressing  '[x]'$ Independent   '[]'$ Min '[]'$ Mod '[]'$ Max assist  '[]'$ Unable    Toileting  '[x]'$ Independent  '[]'$ Min '[]'$ Mod '[]'$ Max assist  '[]'$ Unable    Bowel Mgt: '[x]'$  Continent '[]'$  Incontinent '[]'$  Accidents '[]'$  Diapers '[]'$  Colostomy '[]'$  Bowel Program:  Bladder Mgt: '[x]'$  Continent '[]'$  Incontinent '[]'$  Accidents '[]'$  Diapers '[]'$  Urinal '[]'$  Intermittent Cath '[]'$  Indwelling Cath '[]'$  Supra-pubic Cath     Current Mobility Equipment Trialed/ Ruled Out:    Does not meet mobility needs due to:    [place] all boxes that indicate inability to use the specific equipment listed     Meets needs for safe  independent functional  ambulation  / mobility    Risk of  Falling or History of  Falls    Enviromental limitations      Cognition    Safety concerns with  physical ability    Decreased / limitations endurance  & strength     Decreased / limitations  motor skills  & coordination    Pain    Pace /  Speed    Cardiac and/or  respiratory condition    Contra - indicated by diagnosis   Cane/Crutches  '[]'$   '[x]'$   '[]'$   '[]'$   '[x]'$   '[x]'$   '[]'$   '[x]'$   '[x]'$   '[x]'$   '[]'$    Walker / Rollator  '[]'$  NA   '[]'$   '[x]'$   '[]'$   '[]'$   '[x]'$   '[x]'$   '[]'$   '[x]'$   '[x]'$   '[x]'$   '[]'$     Manual Wheelchair [id number]:  '[]'$  NA  '[]'$   '[]'$   '[]'$   '[]'$   '[x]'$   '[]'$   '[]'$   '[x]'$   '[x]'$   '[x]'$   '[]'$    Manual W/C ([id number]) with power assist  '[]'$  NA  '[]'$   '[]'$   '[]'$   '[]'$   '[]'$   '[]'$   '[]'$   '[]'$   '[]'$   '[]'$   '[]'$    Scooter  '[]'$  NA  '[]'$   '[]'$   '[]'$   '[]'$   '[]'$   '[]'$   '[]'$   '[]'$   '[]'$   '[]'$   '[]'$    Power Wheelchair: standard joystick  '[]'$  NA  '[x]'$   '[]'$   '[]'$   '[]'$   '[]'$   '[]'$   '[]'$   '[]'$   '[]'$   '[]'$   '[]'$    Power Wheelchair: alternative controls  '[]'$  NA  '[]'$   '[]'$   '[]'$   '[]'$   '[]'$   '[]'$   '[]'$   '[]'$   '[]'$   '[]'$   '[]'$    Summary:  The least costly alternative for independent functional mobility was found to be:     '[]'$  Crutch/Cane  '[]'$  Walker '[]'$  Manual w/c  '[]'$  Manual w/c with power assist   '[]'$  Scooter   '[x]'$  Power w/c std joystick   '[]'$  Power w/c alternative control        '[]'$  Requires dependent care mobility device   Media planner for [employer] skills are adequate for safe mobility equipment operation  '[x]'$   Yes '[]'$   No  Patient is willing and motivated to use recommended mobility equipment  '[x]'$   Yes '[]'$   No       '[]'$  Patient is unable to safely operate mobility equipment independently and requires dependent care equipment Comments:           SENSATION and SKIN ISSUES:  Sensation '[]'$  Intact  '[x]'$  Impaired '[]'$  Absent '[]'$  Hyposensate '[]'$  Hypersensate  '[]'$  Defensiveness  Location(s) of impairment: impaired sensation in L residual leg, pt reports of cramping in bil hands and R foot   Pressure Relief Method(s):  '[x]'$  Lean side to side to offload (without risk of falling)  '[x]'$   W/C push up (4+ times/hour for 15+ seconds) '[]'$  Stand up (without risk of falling)    '[]'$  Other: (Describe): Effective pressure relief method(s) above can be performed consistently throughout the day: rYes  r No If not, Why?:  Skin Integrity Risk:       '[x]'$  Low risk           '[]'$  Moderate risk            '[]'$  High risk  If high risk, explain:   Skin Issues/Skin Integrity  Current skin Issues  '[]'$  Yes '[x]'$  No '[]'$  Intact  '[]'$   Red area   '[]'$   Open area  '[]'$  Scar tissue  '[]'$   At risk from prolonged sitting  Where: History of Skin Issues  '[]'$  Yes '[x]'$  No Where : When: Stage: Hx of skin flap surgeries  '[]'$  Yes '[x]'$  No Where:  When:  Pain: '[x]'$  Yes '[]'$  No   Pain Location(s): bil shoulders, L residual leg Intensity scale: (0-10) : 8-10/10 How does pain interfere with mobility and/or MRADLs? - difficulty with propulsion of wheelchair, difficulty with standing, unable to ambulate >10 feet, high fall risk        MAT EVALUATION:  Neuro-Muscular Status: (Tone, Reflexive, Responses, etc.)     '[x]'$   Intact   '[]'$  Spasticity:   '[]'$  Hypotonicity  '[]'$  Fluctuating  '[]'$  Muscle Spasms  '[]'$  Poor Righting Reactions/Poor Equilibrium Reactions  '[]'$  Primal Reflex(s):    Comments:            COMMENTS:    POSTURE:     Comments:  Pelvis Anterior/Posterior:  '[x]'$  Neutral   '[]'$  Posterior  '[]'$  Anterior  '[]'$  Fixed - No movement '[]'$  Tendency away from neutral '[]'$  Flexible '[]'$  Self-correction '[]'$  External correction Obliquity (viewed from front)  '[x]'$  WFL '[]'$  R Obliquity '[]'$  L Obliquity  '[]'$  Fixed - No movement '[]'$  Tendency away from neutral '[]'$  Flexible '[]'$  Self-correction '[]'$  External correction Rotation  '[x]'$  WFL '[]'$  R anterior '[]'$  L anterior  '[]'$  Fixed - No movement '[]'$  Tendency away from neutral '[]'$  Flexible '[]'$  Self-correction '[]'$  External correction Tonal Influence Pelvis:  '[x]'$  Normal '[]'$  Flaccid '[]'$  Low tone '[]'$  Spasticity '[]'$  Dystonia '[]'$  Pelvis thrust '[]'$  Other:    Trunk Anterior/Posterior:  '[x]'$  WFL '[]'$  Thoracic kyphosis '[]'$  Lumbar lordosis  '[]'$  Fixed - No movement '[]'$  Tendency away from neutral '[]'$  Flexible '[]'$  Self-correction '[]'$  External correction  '[x]'$  WFL '[]'$  Convex to left  '[]'$  Convex to right '[]'$  S-curve   '[]'$  C-curve '[]'$  Multiple curves '[]'$  Tendency away from neutral '[]'$  Flexible '[]'$  Self-correction '[]'$  External correction Rotation of shoulders and upper trunk:  '[x]'$  Neutral '[]'$  Left-anterior '[]'$  Right- anterior '[]'$  Fixed- no movement '[]'$  Tendency away from neutral '[]'$  Flexible '[]'$  Self correction '[]'$  External correction Tonal influence Trunk:  '[x]'$  Normal '[]'$  Flaccid '[]'$  Low tone '[]'$  Spasticity '[]'$  Dystonia '[]'$  Other:   Head & Neck  '[x]'$  Functional '[]'$  Flexed    '[]'$  Extended '[]'$  Rotated right  '[]'$  Rotated left '[]'$  Laterally flexed right '[]'$  Laterally flexed left '[]'$  Cervical hyperextension   '[x]'$  Good head control '[]'$  Adequate head control '[]'$  Limited head control '[]'$  Absent head control Describe tone/movement of head and neck:      Lower Extremity Measurements: LE ROM:  MMT Right 10/26/2022  Left 10/26/2022  Hip flexion 4/5 4/5  Hip extension    Hip abduction    Hip adduction    Knee flexion 4/5   Knee extension 4/5   Ankle dorsiflexion 5/5   Ankle plantarflexion     (Blank rows = not tested)   Hip positions:  '[x]'$  Neutral   '[]'$  Abducted   '[]'$  Adducted  '[]'$  Subluxed   '[]'$  Dislocated   '[]'$  Fixed   '[]'$  Tendency away from neutral '[x]'$  Flexible '[x]'$  Self-correction '[]'$  External correction   Hip Windswept:'[x]'$  Neutral  '[]'$  Right    '[]'$  Left  '[]'$  Subluxed   '[]'$  Dislocated   '[]'$  Fixed   '[]'$  Tendency away from neutral '[x]'$  Flexible '[x]'$  Self-correction '[]'$  External correction  LE Tone: '[x]'$  Normal '[]'$  Low tone '[]'$  Spasticity '[]'$  Flaccid '[]'$  Dystonia '[]'$  Rocks/Extends at hip '[]'$  Thrust into knee extension '[]'$  Pushes legs  downward into footrest  Foot positioning: ROM Concerns: Dorsiflexed: '[]'$  Right   '[]'$  Left Plantar flexed: '[]'$  Right    '[]'$  Left Inversion: '[]'$  Right    '[]'$  Left Eversion: '[]'$  Right    '[]'$  Left  LE Edema: '[x]'$  1+ (Barely detectable impression when finger is pressed into skin) '[]'$  2+ (slight indentation. 15 seconds to rebound) '[]'$  3+ (deeper indentation. 30 seconds to rebound) '[]'$  4+ (>30 seconds to rebound)  UE Measurements:  UPPER EXTREMITY ROM:   Active ROM Right 10/26/2022 Left 10/26/2022  Shoulder flexion 80 pain 90 pain  Shoulder abduction 80 pain 90 pain  Shoulder adduction    Elbow flexion    Elbow extension    Wrist flexion    Wrist extension    (Blank rows = not tested)  UPPER EXTREMITY MMT:  MMT Right 10/26/2022 Left 10/26/2022  Shoulder flexion 3+/5 pain 3+/5 pain  Shoulder abduction 3-/5 pain 3-/5 pain  Shoulder adduction    Elbow flexion 5/5 5/5  Elbow extension 5/5 5/5  Wrist flexion 5/5 5/5  Wrist extension 5/5 5/5  Pinch strength    Grip strength 63.7 lbs 43.8lbs  (Blank rows = not tested)  Shoulder Posture:  Right Tendency towards Left  '[]'$   Functional '[]'$    '[]'$   Elevation '[]'$    '[]'$   Depression '[]'$    '[]'$   Protraction '[]'$    '[]'$   Retraction '[]'$     '[x]'$   Internal rotation '[x]'$    '[]'$   External rotation '[]'$    '[]'$   Subluxed '[]'$     UE Tone: '[x]'$  Normal '[]'$  Flaccid '[]'$  Low tone '[]'$  Spasticity  '[]'$  Dystonia '[]'$  Other:   UE Edema: '[x]'$  1+ (Barely detectable impression when finger is pressed into skin) '[]'$  2+ (slight indentation. 15 seconds to rebound) '[]'$  3+ (deeper indentation. 30 seconds to rebound) '[]'$  4+ (>30 seconds to rebound)  Wrist/Hand: Handedness: '[x]'$  Right   '[]'$  Left   '[]'$  NA: Comments:  Right  Left  '[]'$   WNL '[]'$    '[]'$   Limitations '[]'$    '[]'$   Contractures '[]'$    '[]'$   Fisting '[]'$    '[]'$   Tremors '[]'$    '[]'$   Weak grasp '[x]'$    '[]'$   Poor dexterity '[]'$    '[]'$   Hand movement non functional '[]'$    '[]'$   Paralysis '[]'$         MOBILITY BASE RECOMMENDATIONS and JUSTIFICATION:  MOBILITY BASE  JUSTIFICATION   Manufacturer:   J[employer] EVO Model:             613                 Color: Robin's egg blue Seat Width:  18" Seat Depth 18"   '[]'$  Manual mobility base (continue below)   '[]'$  Scooter/POV  '[x]'$  Power mobility base   Number of hours per day spent in above selected mobility base: 12+  Typical daily mobility base use Schedule: throughout the day   '[x]'$  is not a safe, functional ambulator  '[x]'$  limitation prevents from completing a MRADL(s) within a reasonable time frame    '[x]'$  limitation places at high risk of morbidity or mortality secondary to  the attempts to perform a    MRADL(s)  '[]'$  limitation prevents accomplishing a MRADL(s) entirely  '[x]'$  provide independent mobility  '[x]'$  equipment is a lifetime medical need  '[x]'$  walker or cane inadequate  '[x]'$  any type manual wheelchair      inadequate (DUE TO BIL SHOULDER PAIN) '[x]'$  scooter/POV inadequate      '[]'$  requires dependent  mobility          MANUAL MOBILITY      '[]'$  Standard manual wheelchair  K0001      Arm:    '[]'$  both '[]'$  right  '[]'$  left      Foot:   '[]'$  both '[]'$  right   '[]'$  left  '[]'$  self-propels wheelchair  '[]'$  will use on regular basis  '[]'$  chair fits throughout home  '[]'$  willing and motivated to use   '[]'$  propels with assistance     '[]'$  dependent use   '[]'$  Standard hemi-manual wheelchair  K0002      Arm:    '[]'$  both '[]'$  right  '[]'$  left      Foot:   '[]'$  both '[]'$  right   '[]'$  left  '[]'$  lower seat height required to foot propel  '[]'$  short stature  '[]'$  self-propels wheelchair  '[]'$  will use on regular basis  '[]'$  chair fits throughout home  '[]'$  willing and motivated to use   '[]'$  propels with assistance  '[]'$  dependent use   '[]'$  Lightweight manual wheelchair  K0003      Arm:    '[]'$  both '[]'$  right  '[]'$  left      Foot:   '[]'$  both  '[]'$  right  '[]'$  left                   '[]'$  hemi height required  '[]'$  medical condition and weight of  wheelchair affect ability to self      propel standard manual wheelchair in the residence  '[]'$  can and does self-propel (marginal propulsion skills)  '[]'$  daily use _________hours  '[]'$  chair fits throughout home  '[]'$  willing and motivated to use  '[]'$  lower seat height required to foot propel  '[]'$  short stature   '[]'$  High strength lightweight manual  wheelchair (Breezy Ultra 4)  K0004     Arm:    '[]'$  both '[]'$  right  '[]'$  left     Foot:   '[]'$  both '[]'$  right   '[]'$  left                                                                  '[]'$  hemi height required '[]'$  medical condition and weight of wheelchair affect ability to self propel while engaging in frequent MRADL(s) that cannot be performed in a standard or lightweight manual wheelchair  '[]'$  daily use _________hours  '[]'$  chair fits throughout home  '[]'$  willing and motivated to use  '[]'$  prevent repetitive use injuries   '[]'$  lower seat height required to foot propel  '[]'$  short stature    '[]'$  Ultra-lightweight manual wheelchair  K0005     Arm:    '[]'$  both '[]'$  right  '[]'$  left     Foot:   '[]'$  both '[]'$  right  '[]'$  left       '[]'$  hemi height required  '[]'$  heavy duty    Front seat to floor _____ inches      Rear seat to floor _____ inches      Back height _____ inches     Back angle ______ degrees      Front angle _____ degrees  '[]'$   full-time manual wheelchair  user  '[]'$  Requires individualized fitting and optimal adjustments for  multiple features that include adjustable axle configuration, fully adjustable center of gravity, wheel camber, seat and back angle, angle of seat slope, which cannot be accommodated by a K0001 through K0004 manual wheelchair  '[]'$  prevent repetitive use injuries  '[]'$  daily use_________hours   '[]'$  user has high activity patterns that frequently require  them  to go out into the community for the purpose of independently accomplishing high level MRADL activities. Examples of these might include a combination of; shopping, work, school, Science writer, childcare, independently loading and unloading from a vehicle etc.  '[]'$  lower seat height required to foot propel  '[]'$  short stature  '[]'$  heavy duty -  weight over 250lbs   '[]'$  Current chair is a K0005   manufacture:___________________  model:_________________  serial#____________________  age:_________    '[]'$  First time [id number] user (complete trial)  K0004 time and # of strokes to propel 30 feet: ________seconds _________strokes  [id number] time and # of strokes to propel 30 feet: ________seconds _________strokes  What was the result of the trial between the K0004 and K0005 manual wheelchair? ___    What features of the K0005 w/c are needed as compared to the K0004 base? Why?___    '[]'$  adjustable seat and back angle changes the angle of seat slope of the frame to attain a gravity assisted position for efficient propulsion and proper weight distribution along the frame     '[]'$  the front of the wheelchair will be configured higher than the back of the chair to allow gravity to assist the user with postural stability  '[]'$  the center of the wheel will be positioned for stability, safety and efficient propulsion  '[]'$  adjustable axle allows for vertical, horizontal, camber and overall width changes  throughout the wheels for adjustment of the client's exact needs and abilities.   '[]'$  adjustable axle  increases the stability and function of the chair allowing for adjustment of the center of gravity.   '[]'$  accommodates the client's anatomical position in the chair maximizing independence in mobility and maneuverability in all environments.   '[]'$  create a minimal fixed tilt-in space to assist in positioning.   '[]'$  Describe users full-time manual wheelchair activity patterns:___    '[]'$  Power assist Comments:  '[]'$  prevent repetitive use injuries  '[]'$  repetitive strain injury present in    shoulder girdle    '[]'$  shoulder pain is (> or =) to 7/10     during manual propulsion       Current Pain _____/10  '[]'$  requires conservation of energy to participate in MRADL(s) runable to propel up ramps or curbs using manual wheelchair  '[]'$  been K0005 user greater than one year  '[]'$  user unwilling to use power      wheelchair (reason): '[]'$  less expensive option to power   wheelchair   '[]'$  rim activated power assist -      decreased strength   '[]'$  Heavy duty manual wheelchair       K0006     Arm:    '[]'$  both '[]'$  right  '[]'$  left     Foot:   '[]'$  both '[]'$  right  '[]'$  left     '[]'$  hemi height required    '[]'$  Dependent base  '[]'$  user exceeds 250lbs  '[]'$  non-functional ambulator    '[]'$  extreme spasticity  '[]'$  over active movement   '[]'$  broken frame/hx of repeated     repairs  '[]'$  able to self-propel in residence       '[]'$  lower seat to  floor height required  '[]'$  unable to self-propel in residence   '[]'$  Extra heavy duty manual wheelchair  K0007     Arm:    '[]'$  both '[]'$  right  '[]'$  left     Foot:   '[]'$  both '[]'$  right  '[]'$  left     '[]'$  hemi height required  '[]'$  Dependent base  '[]'$  user exceeds 300lbs  '[]'$  non-functional ambulator    '[]'$  able to self-propel in residence   '[]'$  lower seat to floor height required  '[]'$  unable to self-propel in residence     '[]'$  Manual wheelchair with tilt [phone]      (Manual "Tilt-n-Space")  '[]'$  patient is dependent for transfers  '[]'$  patient requires frequent       positioning for pressure relief   '[]'$  patient  requires frequent      positioning for poor/absent trunk control        '[]'$  Stroller Base  '[]'$  infant/child   '[]'$  unable to propel manual      wheelchair  '[]'$  allows for growth  '[]'$  non-functional ambulator  '[]'$  non-functional UE  '[]'$  independent mobility is not a goal at this time    [place] handles  '[]'$  extended  rangle adjustable   '[]'$  standard  '[]'$  caregiver access  '[]'$  caregiver assist    '[]'$  allows "hooking" to enable      increased ability to perform ADLs or maintain balance   '[]'$  Angle Adjustable Back  '[]'$  postural control  '[]'$  control of tone/spasticity  '[]'$  accommodation of range of motion  '[]'$  UE functional control  '[]'$  accommodation for seating system    Rear wheel placement  '[]'$  std/fixed rfully adjustableramputee   '[]'$  camber ________degree  '[]'$  removable rear wheel  '[]'$  non-removable rear wheel  Wheel size _______  Wheel style_______________________  '[]'$  improved UE access to wheels  '[]'$  increase propulsion ability  '[]'$  improved stability  '[]'$  changing angle in space for      improvement of postural stability  '[]'$  remove for transport    '[]'$  allow for seating system to fit on      base  '[]'$  amputee placement  '[]'$  1-arm drive access   r R  r L  '[]'$  enable propulsion of manual       wheelchair with one arm    '[]'$  amputee placement   Wheel rims/ Hand rims  '[]'$  Standard    '[]'$  Specialized-____ '[]'$  provide ability to propel manual   '[]'$  increase self-propulsion with hand wheelchair weakness/decreased grasp     '[]'$  Spoke protector/guard   '[]'$  prevent hands from getting caught in spokes   Tires:  '[]'$  pneumatic  '[]'$  flat free inserts  '[]'$  solid  Style:  '[]'$  decrease roll resistance              '[]'$  prevent frequent flats  '[]'$  increase shock absorbency  '[]'$  decrease maintenance   '[]'$  decrease pain from road shock    '[]'$  decrease spasms from road shock    Wheel Locks:    '[]'$  push '[]'$  pull '[]'$  scissor  '[]'$  lock wheels for transfers  '[]'$  lock wheels from rolling   Brake/wheel lock extension:   '[]'$  R  '[]'$  L  '[]'$  allow user to operate wheel locks due to decreased reach or strength   Caster housing:  Caster size:  Style:                                          '[]'$  suspension fork  '[]'$  maneuverability   '[]'$  stability of wheelchair   '[]'$  durability  '[]'$  maintenance  '[]'$  angle adjustment for posture  '[]'$  allow for feet to come under        wheelchair base  '[]'$  allows change in seat to floor      height   '[]'$  increase shock absorbency  '[]'$  decrease pain from road shock  '[]'$  decrease spasms from road    shock   '[]'$  Side guards  '[]'$  prevent clothing getting caught in wheel or becoming soiled  rprovide hip and pelvic stability  '[]'$  eliminates contact between body and wheels  '[]'$  limit hand contact with wheels   '[]'$  Anti-tippers      '[]'$  prevent wheelchair from tipping    backward  '[]'$  assist caregiver with curbs     POWER MOBILITY      '[]'$  Scooter/POV    '[]'$  can safely operate   '[]'$  can safely transfer   '[]'$  has adequate trunk stability   '[]'$  cannot functionally propel  manual wheelchair    '[x]'$  Power mobility base    '[x]'$  non-ambulatory   '[x]'$  cannot functionally propel manual wheelchair   '[x]'$  cannot functionally and safely      operate scooter/POV  '[x]'$  can safely operate power       wheelchair  '[x]'$  home is accessible  '[x]'$  willing to use power wheelchair     Tilt  '[]'$  Powered tilt on powered chair  '[]'$  Powered tilt on manual chair  '[]'$  Manual tilt on manual chair Comments:  '[]'$  change position for pressure      '[]'$  elief/cannot weight shift   '[]'$  change position against      gravitational force on head and      shoulders   '[]'$  decrease pain  '[]'$  blood pressure management   '[]'$  control autonomic dysreflexia  '[]'$  decrease respiratory distress  '[]'$  management of spasticity  '[]'$  management of low tone  '[]'$  facilitate postural control   '[]'$  rest periods   '[]'$  control edema  '[]'$  increase sitting tolerance   '[]'$  aid with transfers     Recline   '[]'$  Power recline on power chair  '[]'$  Manual  recline on manual chair  Comments:    '[]'$  intermittent catheterization  '[]'$  manage spasticity  '[]'$  accommodate femur to back angle  '[]'$  change position for pressure relief/cannot weight shift rhigh risk of pressure sore development  '[]'$  tilt alone does not accomplish     effective pressure relief, maximum pressure relief achieved at -      _______ degrees tilt   _______ degrees recline   '[]'$  difficult to transfer to and from bed '[]'$  rest periods and sleeping in chair  '[]'$  repositioning for transfers  '[]'$  bring to full recline for ADL care  '[]'$  clothing/diaper changes in chair  '[]'$  gravity PEG tube feeding  '[]'$  head positioning  '[]'$  decrease pain  '[]'$  blood pressure management   '[]'$  control autonomic dysreflexia  '[]'$  decrease respiratory distress  '[]'$  user on ventilator     Elevator on mobility base  '[]'$  Power wheelchair  '[]'$  Scooter  '[]'$  increase Indep in transfers   '[]'$  increase Indep in ADLs    '[]'$  bathroom function and  safety  '[]'$  kitchen/cooking function and safety  '[]'$  shopping  '[]'$  raise height for communication at standing level  '[]'$  raise height for eye contact which reduces cervical neck strain and pain  '[]'$  drive at raised height for safety and navigating crowds  '[]'$  Other:   '[]'$  Vertical position system  (anterior tilt)     (Drive locks-out)    '[]'$  Stand       (Drive enabled)  '[]'$  independent weight bearing  '[]'$  decrease joint contractures  '[]'$  decrease/manage spasticity  '[]'$  decrease/manage spasms  '[]'$  pressure distribution away from   scapula, sacrum, coccyx, and ischial tuberosity  '[]'$  increase digestion and elimination   '[]'$  access to counters and cabinets  '[]'$  increase reach  '[]'$  increase interaction with others at eye level, reduces neck strain  '[]'$  increase performance of       MRADL(s)      Power elevating legrest    '[]'$  Center mount (Single) 85-170 degrees       '[]'$  Standard (Pair) 100-170 degrees  '[]'$  position legs at 90 degrees, not available with std power ELR  '[]'$  center mount tucks  into chair to decrease turning radius in home, not available with std power ELR  '[]'$  provide change in position for LE  '[]'$  elevate legs during recline    '[]'$  maintain placement of feet on      footplate  '[]'$  decrease edema  '[]'$  improve circulation  '[]'$  actuator needed to elevate legrest  '[]'$  actuator needed to articulate legrest preventing knees from flexing  '[]'$  Increase ground clearance over      curbs  '[]'$   STD (pair) independently                     elevate legrest   POWER WHEELCHAIR CONTROLS      Controls/input device  '[]'$  Expandable  '[]'$  Non-expandable  '[]'$  Proportional  '[]'$  Right Hand '[]'$  Left Hand  '[]'$  Non-proportional/switches/head-array  '[]'$  Electrical/proximity         '[]'$   Mechanical      Manufacturer:___________________   Type:________________________ '[]'$  provides access for controlling wheelchair  '[]'$  programming for accurate control  '[]'$  progressive disease/changing condition  '[]'$  required for alternative drive      controls       '[]'$  lacks motor control to operate  proportional drive control  '[]'$  unable to understand proportional controls  '[]'$  limited movement/strength  '[]'$  extraneous movement / tremors / ataxic / spastic       '[]'$  Upgraded electronics controller/harness    '[]'$  Single power (tilt or recline)   '[]'$  Expandable    '[]'$  Non-expandable plus   '[]'$  Multi-power (tilt, recline, power legrest, power seat lift, vertical positioning system, stand)  '[]'$  allows input device to communicate with drive motors  '[]'$  harness provides necessary connections between the controller, input device, and seat functions     '[]'$  needed in order to operate power seat functions through joystick/ input device  '[]'$  required for alternative drive controls     '[]'$  Enhanced display  '[]'$  required to connect all alternative drive controls   '[]'$  required for upgraded joystick      (lite-throw, heavy duty, micro)  '[]'$  Allows user to see in which mode and drive the wheelchair is set; necessary for alternate controls        '[]'$  Upgraded tracking electronics  '[]'$  correct tracking when on uneven surfaces makes switch driving more efficient and less fatiguing  '[]'$  increase safety when  driving  '[]'$  increase ability to traverse thresholds    '[]'$  Safety / reset / mode switches     Type:    '[]'$  Used to change modes and stop the wheelchair when driving     '[x]'$  [facility] for joystick / input device/switches  '[x]'$  swing away for access or transfers   '[x]'$  attaches joystick / input device / switches to wheelchair   '[x]'$  provides for consistent access  '[]'$  midline for optimal placement    '[]'$  Attendant controlled joystick plus     mount  '[]'$  safety  '[]'$  long distance driving  '[]'$  operation of seat functions  '[]'$  compliance with transportation regulations    '[x]'$  Battery  '[]'$  required to power (power assist / scooter/ power wc / other):   '[]'$  Power inverter (24V to 12V)  '[]'$  required for ventilator / respiratory equipment / other:     CHAIR OPTIONS MANUAL & POWER      Armrests   '[x]'$  adjustable height rremovable  '[]'$  swing away '[]'$  fixed  '[x]'$  flip back  '[]'$  reclining  '[x]'$  full length pads '[]'$  desk '[]'$  tube arms '[]'$  gel pads  '[x]'$  provide support with elbow at 90    '[x]'$  remove/flip back/swing away for  transfers  '[]'$  provide support and positioning of upper body    '[x]'$  allow to come closer to table top  '[]'$  remove for access to tables  '[]'$  provide support for w/c tray  '[]'$  change of height/angles for       variable activities   '[]'$  Elbow support / Elbow stop  '[]'$  keep elbow positioned on arm pad  '[]'$  keep arms from falling off arm pad  during tilt and/or recline   Upper Extremity Support  '[]'$  Arm trough  '[]'$   R  '[]'$   L  Style:  '[]'$  swivel mount '[]'$  fixed mount   '[]'$  posterior hand support  '[]'$   tray  '[]'$  full tray  '[]'$  joystick cut out  '[]'$   R  '[]'$   L  Style:  '[]'$  decrease gravitational pull on      shoulders  '[]'$  provide support to increase UE  function  '[]'$  provide hand support in natural    position  '[]'$  position flaccid UE  '[]'$  decrease subluxation     '[]'$  decrease edema       '[]'$  manage spasticity   '[]'$  provide midline positioning  '[]'$  provide work surface  '[]'$  placement for AAC/ Computer/ EADL       Hangers/ Legrests   '[]'$  ______ degree  '[]'$  Elevating '[]'$  articulating  '[]'$  swing away '[]'$  fixed '[]'$  lift off  '[]'$  heavy duty '[]'$  adjustable knee angle  '[]'$  adjustable calf panel   '[]'$  longer extension tube              '[]'$  provide LE support  '[]'$  maintain placement of feet on      footplate   '[]'$  accommodate lower leg length  '[]'$  accommodate to hamstring       tightness  '[]'$  enable transfers  '[]'$  provide change in position for LE's  '[]'$  elevate legs during recline    '[]'$  decrease edema  '[]'$  durability      Foot support   '[x]'$  footplate '[]'$  R '[]'$  L '[x]'$  flip up           '[]'$  Depth adjustable   '[]'$  angle adjustable  '[]'$  foot board/one piece    '[x]'$  provide foot support  '[x]'$  accommodate to ankle ROM  [  x] allow foot to go under wheelchair base  '[x]'$  enable transfers     '[]'$  Shoe holders  '[]'$  position foot    '[]'$  decrease / manage spasticity  '[]'$  control position of LE  '[]'$  stability    '[]'$  safety     '[]'$  Ankle strap/heel      loops  '[]'$  support foot on foot support  '[]'$  decrease extraneous movement  '[]'$  provide input to heel   '[]'$  protect foot     '[]'$  Amputee adapter '[]'$  R  '[]'$  L     Style:                  Size:  '[]'$  Provide support for stump/residual extremity    '[]'$  Transportation tie-down  '[]'$  to provide crash tested tie-down brackets    '[]'$  Crutch/cane holder    '[]'$  O2 holder    '[]'$  IV hanger   '[]'$  Ventilator tray/mount    '[]'$  stabilize accessory on wheelchair       Component  Justification     '[]'$  Seat cushion      '[]'$  accommodate impaired sensation  '[]'$  decubitus ulcers present or history  '[]'$  unable to shift weight  '[]'$  increase pressure distribution  '[]'$  prevent pelvic extension  '[]'$  custom required "off-the-shelf"    seat cushion will not accommodate deformity  '[]'$  stabilize/promote pelvis alignment  '[]'$  stabilize/promote femur alignment  '[]'$  accommodate obliquity   '[]'$  accommodate multiple deformity  '[]'$  incontinent/accidents  '[]'$  low maintenance     '[]'$  seat mounts                 '[]'$  fixed '[]'$  removable  '[]'$  attach seat platform/cushion to wheelchair frame    '[]'$  Seat wedge    '[]'$  provide increased aggressiveness of seat shape to decrease sliding  down in the seat  '[]'$  accommodate ROM        '[]'$  Cover replacement   '[]'$  protect back or seat cushion  '[]'$  incontinent/accidents    '[]'$  Solid seat / insert    '[]'$  support cushion to prevent      hammocking  '[]'$  allows attachment of cushion to mobility base    '[]'$  Lateral pelvic/thigh/hip     support (Guides)     '[]'$  decrease abduction  '[]'$  accommodate pelvis  '[]'$  position upper legs  '[]'$  accommodate spasticity  '[]'$  removable for transfers     '[]'$  Lateral pelvic/thigh      supports mounts  '[]'$  fixed   '[]'$  swing-away   '[]'$  removable  '[]'$  mounts lateral pelvic/thigh supports     '[]'$  mounts lateral pelvic/thigh supports swing-away or removable for transfers    '[]'$  Medial thigh support ([place])  '[]'$ decrease adduction  '[]'$ accommodate ROM  '[]'$  remove for transfers   '[]'$  alignment      '[]'$  Medial thigh   '[]'$  fixed      support mounts      '[]'$  swing-away   '[]'$  removable  '[]'$  mounts medial thigh supports   '[]'$  Mounts medial supports swing- away or removable for transfers       Component  Justification   '[]'$  Back       '[]'$  provide posterior trunk support '[]'$  facilitate tone  '[]'$  provide lumbar/sacral support '[]'$  accommodate deformity  '[]'$  support trunk in midline   '[]'$  custom required "off-the-shelf" back support will not accommodate deformity   '[]'$  provide lateral trunk support '[]'$  accommodate or decrease tone            '[]'$   Back mounts  '[]'$  fixed  '[]'$  removable  '[]'$  attach back rest/cushion to wheelchair frame   '[]'$  Lateral trunk      supports  '[]'$  R '[]'$  L  '[]'$  decrease lateral trunk leaning  '[]'$  accommodate asymmetry    '[]'$  contour for increased contact  '[]'$  safety    '[]'$  control of tone    '[]'$  Lateral trunk      supports mounts  '[]'$  fixed  '[]'$   swing-away   '[]'$  removable  '[]'$  mounts lateral trunk supports     '[]'$  Mounts lateral trunk supports swing-away or removable for transfers   '[]'$  Anterior chest      strap, vest     '[]'$  decrease forward movement of shoulder  '[]'$  decrease forward movement of trunk  '[]'$  safety/stability  '[]'$  added abdominal support  '[]'$  trunk alignment  '[]'$  assistance with shoulder control   '[]'$  decrease shoulder elevation    '[]'$  Headrest      '[]'$  provide posterior head support  '[]'$  provide posterior neck support  '[]'$  provide lateral head support  '[]'$  provide anterior head support  '[]'$  support during tilt and recline  '[]'$  improve feeding     '[]'$  improve respiration  '[]'$  placement of switches  '[]'$  safety    '[]'$  accommodate ROM   '[]'$  accommodate tone  '[]'$  improve visual orientation   '[]'$  Headrest           '[]'$  fixed '[]'$  removable '[]'$  flip down      Mounting hardware   '[]'$  swing-away laterals/switches  '[]'$  mount headrest   '[]'$  mounts headrest flip down or  removable for transfers  '[]'$  mount headrest swing-away laterals   '[]'$  mount switches     '[]'$  Neck Support    '[]'$  decrease neck rotation  '[]'$  decrease forward neck flexion   Pelvic Positioner    '[]'$  std hip belt          '[]'$  padded hip belt  '[]'$  dual pull hip belt  '[]'$  four point hip belt  '[]'$  stabilize tone  '[]'$  decrease falling out of chair  '[]'$  prevent excessive extension  '[]'$  special pull angle to control      rotation  '[]'$  pad for protection over boney   prominence  '[]'$  promote comfort    '[]'$  Essential needs        bag/pouch   '[]'$  medicines '[]'$  special food rorthotics '[]'$  clothing changes  '[]'$  diapers  '[]'$  catheter/hygiene '[]'$  ostomy supplies   The above equipment has a life- long use expectancy.  Growth and changes in medical and/or functional conditions would be the exceptions.   SUMMARY:  Why mobility device was selected; include why a lower level device is not appropriate:   ASSESSMENT:  CLINICAL IMPRESSION: Patient is a 71 y.o. male who was seen today for physical therapy evaluation  and treatment for wheelchair evaluation. Patient had L above knee amputation about 1.5 years ago. Patient is a limited Ambulator. Currently not using prosthesis due to fitment issues and increased pain in residual leg. Patient is in manual wheelchair which he is having significant issues due to bil shoulder pains and ROM concerns. He can propel manual wheelchair at very low pace which prevents him from moving around his house more difficult and access to community very difficult as well. Patient will benefit from power wheelchair to improve in home and community ambulation.   OBJECTIVE IMPAIRMENTS Abnormal gait, decreased activity tolerance, decreased balance, decreased endurance, decreased mobility, difficulty walking, decreased ROM,  decreased strength, hypomobility, impaired flexibility, impaired sensation, impaired UE functional use, prosthetic dependency , and pain.   ACTIVITY LIMITATIONS carrying, lifting, standing, squatting, stairs, transfers, and locomotion level  PARTICIPATION LIMITATIONS: meal prep, cleaning, driving, shopping, community activity, yard work, and church  PERSONAL FACTORS Past/current experiences, Time since onset of injury/illness/exacerbation, Transportation, and 1-2 comorbidities: L above knee amputation, bil shoulder pain/ROM issues  are also affecting patient's functional outcome.   REHAB POTENTIAL: Good  CLINICAL DECISION MAKING: Stable/uncomplicated  EVALUATION COMPLEXITY: Low                                   GOALS: One time visit. No goals established.    PLAN: PT FREQUENCY: one time visit    Kerrie Pleasure, PT 10/26/2022, 11:33 AM    I concur with the above findings and recommendations of the therapist:  Physician name printed:         Physician's signature:      Date:

## 2022-10-30 ENCOUNTER — Other Ambulatory Visit: Payer: Self-pay | Admitting: Vascular Surgery

## 2022-10-30 ENCOUNTER — Other Ambulatory Visit: Payer: Self-pay | Admitting: Student

## 2022-10-30 DIAGNOSIS — R0609 Other forms of dyspnea: Secondary | ICD-10-CM

## 2022-10-30 DIAGNOSIS — I10 Essential (primary) hypertension: Secondary | ICD-10-CM

## 2022-11-03 DIAGNOSIS — Z86010 Personal history of colonic polyps: Secondary | ICD-10-CM | POA: Diagnosis not present

## 2022-11-03 DIAGNOSIS — K449 Diaphragmatic hernia without obstruction or gangrene: Secondary | ICD-10-CM | POA: Diagnosis not present

## 2022-11-03 DIAGNOSIS — D509 Iron deficiency anemia, unspecified: Secondary | ICD-10-CM | POA: Diagnosis not present

## 2022-11-03 DIAGNOSIS — K5900 Constipation, unspecified: Secondary | ICD-10-CM | POA: Diagnosis not present

## 2022-11-03 DIAGNOSIS — K219 Gastro-esophageal reflux disease without esophagitis: Secondary | ICD-10-CM | POA: Diagnosis not present

## 2022-11-04 NOTE — Telephone Encounter (Signed)
Faxed order, office notes and demographics to the [facility]   Talbot Grumbling, RN

## 2022-11-14 ENCOUNTER — Telehealth: Payer: Self-pay

## 2022-11-14 ENCOUNTER — Ambulatory Visit
Admission: RE | Admit: 2022-11-14 | Discharge: 2022-11-14 | Disposition: A | Discharge status: Home / Self Care | Payer: Medicare HMO | Source: Ambulatory Visit | Attending: Family Medicine | Admitting: Family Medicine

## 2022-11-14 DIAGNOSIS — F17200 Nicotine dependence, unspecified, uncomplicated: Secondary | ICD-10-CM

## 2022-11-14 NOTE — Telephone Encounter (Signed)
Denise with [facility] LVM on nurse line in regards to CT.   She reports the patient came in today for CT Chest Lung Screen. She reports they had to cancel the exam due to patient not meeting criteria.   She reports he does not smoke cigarettes but cigars.   Will forward to PCP to update.

## 2022-11-26 ENCOUNTER — Other Ambulatory Visit: Payer: Self-pay | Admitting: Student

## 2022-11-26 DIAGNOSIS — R0609 Other forms of dyspnea: Secondary | ICD-10-CM

## 2022-11-26 DIAGNOSIS — I10 Essential (primary) hypertension: Secondary | ICD-10-CM

## 2022-11-28 ENCOUNTER — Other Ambulatory Visit: Payer: Self-pay | Admitting: Student

## 2022-11-29 ENCOUNTER — Other Ambulatory Visit: Payer: Self-pay | Admitting: *Deleted

## 2022-11-29 DIAGNOSIS — D500 Iron deficiency anemia secondary to blood loss (chronic): Secondary | ICD-10-CM

## 2022-11-29 MED ORDER — FERROUS SULFATE 325 (65 FE) MG PO TBEC: 325.0000 mg | DELAYED_RELEASE_TABLET | Freq: Every day | ORAL | 1 refills | Status: DC

## 2022-11-30 NOTE — Progress Notes (Deleted)
    SUBJECTIVE:   CHIEF COMPLAINT / HPI:   ***  PERTINENT  PMH / PSH: ***  OBJECTIVE:   There were no vitals taken for this visit. ***  General: NAD, pleasant, able to participate in exam Cardiac: RRR, no murmurs. Respiratory: CTAB, normal effort, No wheezes, rales or rhonchi Abdomen: Bowel sounds present, nontender, nondistended, no hepatosplenomegaly. Extremities: no edema or cyanosis. Skin: warm and dry, no rashes noted Neuro: alert, no obvious focal deficits Psych: Normal affect and mood  ASSESSMENT/PLAN:   No problem-specific Assessment & Plan notes found for this encounter.     Dr. Precious Gilding, [place]    {    This will disappear when note is signed, click to select method of visit    :1}

## 2022-12-01 ENCOUNTER — Ambulatory Visit: Payer: Medicare HMO | Admitting: Student

## 2022-12-07 NOTE — Progress Notes (Deleted)
    SUBJECTIVE:   CHIEF COMPLAINT / HPI:   S/p left AKA  PERTINENT  PMH / PSH: ***  OBJECTIVE:   There were no vitals taken for this visit. ***  General: NAD, pleasant, able to participate in exam Cardiac: RRR, no murmurs. Respiratory: CTAB, normal effort, No wheezes, rales or rhonchi Abdomen: Bowel sounds present, nontender, nondistended, no hepatosplenomegaly. Extremities: no edema or cyanosis. Skin: warm and dry, no rashes noted Neuro: alert, no obvious focal deficits Psych: Normal affect and mood  ASSESSMENT/PLAN:   No problem-specific Assessment & Plan notes found for this encounter.     Dr. Precious Gilding, [place]    {    This will disappear when note is signed, click to select method of visit    :1}

## 2022-12-08 ENCOUNTER — Ambulatory Visit: Payer: Medicare HMO | Admitting: Student

## 2023-01-02 DIAGNOSIS — E1140 Type 2 diabetes mellitus with diabetic neuropathy, unspecified: Secondary | ICD-10-CM | POA: Diagnosis not present

## 2023-01-02 DIAGNOSIS — E1136 Type 2 diabetes mellitus with diabetic cataract: Secondary | ICD-10-CM | POA: Diagnosis not present

## 2023-01-02 DIAGNOSIS — R269 Unspecified abnormalities of gait and mobility: Secondary | ICD-10-CM | POA: Diagnosis not present

## 2023-01-02 DIAGNOSIS — Z008 Encounter for other general examination: Secondary | ICD-10-CM | POA: Diagnosis not present

## 2023-01-02 DIAGNOSIS — K219 Gastro-esophageal reflux disease without esophagitis: Secondary | ICD-10-CM | POA: Diagnosis not present

## 2023-01-02 DIAGNOSIS — H259 Unspecified age-related cataract: Secondary | ICD-10-CM | POA: Diagnosis not present

## 2023-01-02 DIAGNOSIS — E1151 Type 2 diabetes mellitus with diabetic peripheral angiopathy without gangrene: Secondary | ICD-10-CM | POA: Diagnosis not present

## 2023-01-02 DIAGNOSIS — I10 Essential (primary) hypertension: Secondary | ICD-10-CM | POA: Diagnosis not present

## 2023-01-02 DIAGNOSIS — M1990 Unspecified osteoarthritis, unspecified site: Secondary | ICD-10-CM | POA: Diagnosis not present

## 2023-01-02 DIAGNOSIS — E785 Hyperlipidemia, unspecified: Secondary | ICD-10-CM | POA: Diagnosis not present

## 2023-01-02 DIAGNOSIS — Z7984 Long term (current) use of oral hypoglycemic drugs: Secondary | ICD-10-CM | POA: Diagnosis not present

## 2023-01-02 DIAGNOSIS — K5900 Constipation, unspecified: Secondary | ICD-10-CM | POA: Diagnosis not present

## 2023-01-02 DIAGNOSIS — R69 Illness, unspecified: Secondary | ICD-10-CM | POA: Diagnosis not present

## 2023-01-05 ENCOUNTER — Encounter: Payer: Self-pay | Admitting: Podiatry

## 2023-01-05 ENCOUNTER — Ambulatory Visit (INDEPENDENT_AMBULATORY_CARE_PROVIDER_SITE_OTHER): Payer: Medicare HMO | Admitting: Podiatry

## 2023-01-05 VITALS — BP 129/66 | HR 78

## 2023-01-05 DIAGNOSIS — E1142 Type 2 diabetes mellitus with diabetic polyneuropathy: Secondary | ICD-10-CM

## 2023-01-05 DIAGNOSIS — B351 Tinea unguium: Secondary | ICD-10-CM | POA: Diagnosis not present

## 2023-01-05 DIAGNOSIS — E1151 Type 2 diabetes mellitus with diabetic peripheral angiopathy without gangrene: Secondary | ICD-10-CM

## 2023-01-05 DIAGNOSIS — I739 Peripheral vascular disease, unspecified: Secondary | ICD-10-CM

## 2023-01-05 DIAGNOSIS — M79674 Pain in right toe(s): Secondary | ICD-10-CM | POA: Diagnosis not present

## 2023-01-05 DIAGNOSIS — M79675 Pain in left toe(s): Secondary | ICD-10-CM

## 2023-01-05 DIAGNOSIS — Z89612 Acquired absence of left leg above knee: Secondary | ICD-10-CM

## 2023-01-05 DIAGNOSIS — I70209 Unspecified atherosclerosis of native arteries of extremities, unspecified extremity: Secondary | ICD-10-CM

## 2023-01-05 NOTE — Progress Notes (Signed)
  Subjective:  Patient ID: [name], male    DOB: Jul 14, 1951,  MRN: [id number]  Chief Complaint  Patient presents with   foot care    Patient is here for right foot diabetic foot care, he states that the second toe gives him problem.    72 y.o. male presents with the above complaint. History confirmed with patient.  Patient presents the office today for evaluation of painful nails on the right foot.  He has a history of a above-knee amputation of the lower extremity on the left side related to peripheral arterial disease.  He has painful thickened elongated and abnormally growing nails x5 present on the right foot.  He is unable to trim them himself due to the thickness.   Objective:  Physical Exam: warm, good capillary refill, nail exam onychomycosis of the toenails, onycholysis, and dystrophic nails x5 on the right foot, no trophic changes or ulcerative lesions.  DP and PT pulses nonpalpable.  Protective sensation is absent to the forefoot. Left Foot: Status post above-knee amputation Right Foot: No ulcerations or hyperkeratotic lesions present plantar right foot  No images are attached to the encounter.  Assessment:   1. Pain due to onychomycosis of toenails of both feet   2. PAD (peripheral artery disease) ([place])   3. 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 DM type 2 with diabetic peripheral neuropathy ([place])   5. S/P above knee amputation, left ([place])      Plan:  Patient was evaluated and treated and all questions answered.   Onychomycosis with pain  -Nails palliatively debrided as below. -Educated on self-care  Procedure: Nail Debridement Rationale: Pain Type of Debridement: manual, sharp debridement. Instrumentation: Nail nipper, rotary burr. Number of Nails: 5  Return in about 3 months (around 04/06/2023) for [facility]         Everitt Amber, DPM [facility]

## 2023-01-13 ENCOUNTER — Telehealth: Payer: Self-pay

## 2023-01-13 NOTE — Telephone Encounter (Signed)
[name] with [employer] calls nurse line requesting recent office notes.   She reports they have received notes from PT, however insurance still requires PCP notes notating need for wheelchair.   We do have visit notes from 09/05/2022.  I called [name] back to to get the fax number. However, no answer. VM left for fax number.

## 2023-01-17 NOTE — Telephone Encounter (Signed)
Clinical notes from 09/05/2022 faxed to [employer] at [phone].

## 2023-01-24 ENCOUNTER — Ambulatory Visit (INDEPENDENT_AMBULATORY_CARE_PROVIDER_SITE_OTHER): Payer: Medicare HMO | Admitting: Physician Assistant

## 2023-01-24 ENCOUNTER — Ambulatory Visit (INDEPENDENT_AMBULATORY_CARE_PROVIDER_SITE_OTHER)
Admission: RE | Admit: 2023-01-24 | Discharge: 2023-01-24 | Disposition: A | Discharge status: Home / Self Care | Payer: Medicare HMO | Source: Ambulatory Visit | Attending: Vascular Surgery | Admitting: Vascular Surgery

## 2023-01-24 ENCOUNTER — Ambulatory Visit (HOSPITAL_COMMUNITY)
Admission: RE | Admit: 2023-01-24 | Discharge: 2023-01-24 | Disposition: A | Discharge status: Home / Self Care | Payer: Medicare HMO | Source: Ambulatory Visit | Attending: Vascular Surgery | Admitting: Vascular Surgery

## 2023-01-24 VITALS — BP 142/79 | HR 65 | Temp 97.8°F | Resp 20 | Ht 67.0 in

## 2023-01-24 DIAGNOSIS — I70229 Atherosclerosis of native arteries of extremities with rest pain, unspecified extremity: Secondary | ICD-10-CM | POA: Insufficient documentation

## 2023-01-24 DIAGNOSIS — I739 Peripheral vascular disease, unspecified: Secondary | ICD-10-CM

## 2023-01-24 DIAGNOSIS — R0989 Other specified symptoms and signs involving the circulatory and respiratory systems: Secondary | ICD-10-CM | POA: Insufficient documentation

## 2023-01-24 DIAGNOSIS — Z89612 Acquired absence of left leg above knee: Secondary | ICD-10-CM

## 2023-01-24 DIAGNOSIS — I6523 Occlusion and stenosis of bilateral carotid arteries: Secondary | ICD-10-CM | POA: Diagnosis not present

## 2023-01-24 DIAGNOSIS — I771 Stricture of artery: Secondary | ICD-10-CM

## 2023-01-24 LAB — VAS US ABI WITH/WO TBI: Right ABI: 0.71

## 2023-01-24 NOTE — Progress Notes (Signed)
Office Note     CC:  follow up Requesting Provider:  Precious Gilding, DO  HPI: [name] is a 73 y.o. (Oct 12, 1951) male who presents for follow up of PAD and carotid artery disease.  He has no history of TIA or stroke. His last carotid duplex 1 year ago showed 1-39% ICA stenosis. 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 Stanford Breed. He unfortunately had a LLE bypass that occluded with no other revascularization options. Prior to that he had undergone Right EIA stenting in 2018 by Dr. Scot Dock, and more recently right femoral endarterectomy in April of 2021 by Dr. Carlis Abbott.   He has had pain and issues with his left AKA stump since his surgery. He unfortunately has had difficulty tolerating use of his prosthesis for this reason. He today continues to report a sharp shooting pain that starts on lateral distal aspect of stump and moves medially in circumferential manner. Not improved with Gabapentin. Says this happens daily. Worse with pressure on the leg. He His right leg however has been without any claudication, rest pain or tissue loss. He does feel right leg is getting weaker. He does use it to transfer but does not stand very much. He does have neuropathy and takes Gabapentin for this. He says he is hopeful to get like little foot peddle machine so he can exercise his right leg. Denies any open wounds   He denies any visual changes, slurred speech, facial drooping, unilateral upper extremity or lower extremity weakness or numbness.He does have muscle spasms both in his right leg and both upper arms. He says he has had several falls and has known torn rotator cuff on right shoulder and arthritis in both his hands. He wears gloves for his arthritis which help some. Otherwise denies any pain in his arms, coldness or weakness.   The pt is on a statin for cholesterol management.  The pt is not on a daily aspirin.   Other AC:  Plavix The pt is on CCB, BB, ACEI for  hypertension.   The pt is diabetic.  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 Vertell Limber neurosurgery   Chronic lower back pain    Claudication of both lower extremities ([place]) 07/15/2015   Critical lower limb ischemia ([place]) 12/19/2019   Diabetes mellitus    takes Metformin daily   GERD (gastroesophageal reflux disease)    takes Protonix daily   History of blood transfusion    "related to low HgB" ((09/10/2015   Hyperlipidemia    takes Vytorin daily   Hypertension    takes Benazepril and Bystolic daily   PAD (peripheral artery disease) (HCC)    Pneumonia    Radiculopathy, cervical region 02/11/2017   Shortness of breath dyspnea    with exertion   Tobacco user    Toe fracture, right 05/09/2011   Wears glasses     Past Surgical History:  Procedure Laterality Date   ABDOMINAL AORTOGRAM W/LOWER EXTREMITY N/A 12/11/2017   Procedure: ABDOMINAL AORTOGRAM W/LOWER EXTREMITY;  Surgeon: Angelia Mould, MD;  Location: [place] [facility] [facility];  Service: Cardiovascular;  Laterality: N/A;   ABDOMINAL AORTOGRAM W/LOWER EXTREMITY Bilateral 04/22/2020   Procedure:  ABDOMINAL AORTOGRAM W/LOWER EXTREMITY;  Surgeon: Marty Heck, MD;  Location: [place] [place] [facility];  Service: Cardiovascular;  Laterality: Bilateral;   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   AMPUTATION Left 02/19/2021   Procedure: LEFT ABOVE KNEE AMPUTATION;  Surgeon: Cherre Robins, MD;  Location: [place];  Service: Vascular;  Laterality: Left;   ANTERIOR CERVICAL DECOMP/DISCECTOMY FUSION  03/08/12   C6-7   ANTERIOR CERVICAL DECOMP/DISCECTOMY FUSION  03/08/2012   Procedure: ANTERIOR CERVICAL DECOMPRESSION/DISCECTOMY FUSION 1 LEVEL/HARDWARE REMOVAL;  Surgeon: Erline Levine, MD;  Location: [facility];  Service: Neurosurgery;  Laterality: N/A;  revison of C5-7 anterior cervical decompression with fusion with Cervical Five-Thoracic One anterior cervical decompression with fusion with interbody prothesis plating and bonegraft   [place]   BIOPSY  12/05/2020   Procedure: BIOPSY;  Surgeon: Ladene Artist, MD;  Location: [place];  Service: Endoscopy;;   BYPASS GRAFT FEMORAL-PERONEAL Left 11/11/2016   Procedure: REDO LEFT FEMORAL-PERONEAL BYPASS WITH PROPATEN 6MM X 80CM GRAFT;  Surgeon: Angelia Mould, MD;  Location: [place];  Service: Vascular;  Laterality: Left;   COLONOSCOPY W/ BIOPSIES AND POLYPECTOMY  08/17/2012   f/u 5 years, 4 polyps, no high grade dysplasia or malignancy, tubular adenoma, hyperplastic polyops   COLONOSCOPY WITH PROPOFOL N/A 12/05/2020   Procedure: COLONOSCOPY WITH PROPOFOL;  Surgeon: Ladene Artist, MD;  Location: [facility];  Service: Endoscopy;  Laterality: N/A;   EMBOLECTOMY Left 12/19/2019   Procedure: LEFT FEMERAL- PERONEAL THROMBECTOMY;  Surgeon: Marty Heck, MD;  Location: [place];  Service: Vascular;  Laterality: Left;   ENDARTERECTOMY FEMORAL Right 04/24/2020   Procedure: RIGHT FEMORAL ENDARTERECTOMY;  Surgeon: Marty Heck, MD;  Location: [place];  Service: Vascular;  Laterality: Right;   ENTEROSCOPY N/A 12/11/2015   Procedure: ENTEROSCOPY;  Surgeon: Carol Ada, MD;  Location: [place];  Service: Endoscopy;  Laterality: N/A;   ENTEROSCOPY N/A 03/29/2018   Procedure: ENTEROSCOPY;  Surgeon: Carol Ada, MD;  Location: [place];  Service: Endoscopy;  Laterality: N/A;   ESOPHAGOGASTRODUODENOSCOPY  08/17/2012   normal esophagus and GEJ, diffuse gastritis with erythema- no malignancy, reactive gastropathy  with focal intestinal metaplasia   ESOPHAGOGASTRODUODENOSCOPY (EGD) WITH PROPOFOL N/A 12/05/2020   Procedure: ESOPHAGOGASTRODUODENOSCOPY (EGD) WITH PROPOFOL;  Surgeon: Ladene Artist, MD;  Location: [facility] ENDOSCOPY;   Service: Endoscopy;  Laterality: N/A;   FEMORAL-POPLITEAL BYPASS GRAFT Left 01/06/2016   Procedure: Left  COMMON FEMORAL-BELOW KNEE POPLITEAL ARTERY Bypass using non-reversed translocated saphenous vein graft from left leg;  Surgeon: Mal Misty, MD;  Location: [place];  Service: Vascular;  Laterality: Left;   GIVENS CAPSULE STUDY N/A 11/24/2015   Procedure: GIVENS CAPSULE STUDY;  Surgeon: Juanita Craver, MD;  Location: [place];  Service: Endoscopy;  Laterality: N/A;   HOT HEMOSTASIS N/A 03/29/2018   Procedure: HOT HEMOSTASIS (ARGON PLASMA COAGULATION/BICAP);  Surgeon: Carol Ada, MD;  Location: [place];  Service: Endoscopy;  Laterality: N/A;   INGUINAL HERNIA REPAIR  1990's   right   INTRAOPERATIVE ARTERIOGRAM Left 01/06/2016   Procedure: INTRA OPERATIVE ARTERIOGRAM LEFT LOWER LEG;  Surgeon: Mal Misty, MD;  Location: [place];  Service: Vascular;  Laterality: Left;   INTRAOPERATIVE ARTERIOGRAM Left 11/11/2016   Procedure: INTRA OPERATIVE ARTERIOGRAM LEFT LOWER EXTRIMITY;  Surgeon: Angelia Mould, MD;  Location: [place];  Service: Vascular;  Laterality: Left;   IR ANGIOGRAM FOLLOW UP STUDY  12/11/2017   IR GENERIC HISTORICAL  10/24/2016   IR ANGIOGRAM FOLLOW  UP STUDY   LOWER EXTREMITY ANGIOGRAM Bilateral 07/30/2015   Procedure: Lower Extremity Angiogram;  Surgeon: Conrad Ozawkie, MD;  Location: [place] [facility];  Service: Cardiovascular;  Laterality: Bilateral;   [place]   "lower"   PATCH ANGIOPLASTY Left 12/19/2019   Procedure: LEFT FEMORAL -PERONEAL PATCH ANGIOPLASTY WITH XENOSURE BIOLOGIC PATCH  ;  Surgeon: Marty Heck, MD;  Location: [place];  Service: Vascular;  Laterality: Left;   PATCH ANGIOPLASTY Right 04/24/2020   Procedure: Patch Angioplasty of Right Femoral Artery using Long Xenosure Biologic Patch 1cm x 14 cm;  Surgeon: Marty Heck, MD;  Location: [place];  Service: Vascular;  Laterality: Right;   PERIPHERAL VASCULAR CATHETERIZATION N/A  07/30/2015   Procedure: Abdominal Aortogram;  Surgeon: Conrad Wilmer, MD;  Location: [place] [facility];  Service: Cardiovascular;  Laterality: N/A;   PERIPHERAL VASCULAR CATHETERIZATION N/A 10/24/2016   Procedure: Abdominal Aortogram;  Surgeon: Angelia Mould, MD;  Location: [place] [facility];  Service: Cardiovascular;  Laterality: N/A;   PERIPHERAL VASCULAR CATHETERIZATION N/A 10/24/2016   Procedure: Lower Extremity Angiography;  Surgeon: Angelia Mould, MD;  Location: [place] [facility];  Service: Cardiovascular;  Laterality: N/A;   PERIPHERAL VASCULAR INTERVENTION Right 12/11/2017   Procedure: PERIPHERAL VASCULAR INTERVENTION;  Surgeon: Angelia Mould, MD;  Location: [place] [facility];  Service: Cardiovascular;  Laterality: Right;   VASCULAR SURGERY  ~ 2007   Stent SFA    VEIN HARVEST Left 01/06/2016   Procedure: LEFT GREATER [place];  Surgeon: Mal Misty, MD;  Location: [facility];  Service: Vascular;  Laterality: Left;    Social History   Socioeconomic History   Marital status: Soil scientist    Spouse name: Not on file   Number of children: 3   Years of education: 14   Highest education level: Some college, no degree  Occupational History   Not on file  Tobacco Use   Smoking status: Some Days    Types: Cigars    Passive exposure: Current   Smokeless tobacco: Never  Vaping Use   Vaping Use: Never used  Substance and Sexual Activity   Alcohol use: Yes    Alcohol/week: 2.0 standard drinks of alcohol    Types: 2 Cans of beer per week   Drug use: No   Sexual activity: Yes  Other Topics Concern   Not on file  Social History Narrative   Lives alone. Lives in an apartment. Ground floor apartment. Smoke alarms present, no grab bars.    Has a shih-tzu, named Missy.    Patient eats a variety of foods. Drinks tea, Kool-aid, coffee      Use public transportation or brother will take places.   Some food insecurity.      Social Determinants  of Health   Financial Resource Strain: High Risk (11/08/2018)   Overall Financial Resource Strain (CARDIA)    Difficulty of Paying Living Expenses: Hard  Food Insecurity: Food Insecurity Present (11/08/2018)   Hunger Vital Sign    Worried About Running Out of Food in the Last Year: Sometimes true    Ran Out of Food in the Last Year: Sometimes true  Transportation Needs: No Transportation Needs (10/05/2022)   PRAPARE - Hydrologist (Medical): No    Lack of Transportation (Non-Medical): No  Physical Activity: Inactive (11/08/2018)   Exercise Vital Sign    Days of Exercise per Week: 0 days  Minutes of Exercise per Session: 0 min  Stress: No Stress Concern Present (11/08/2018)   [place]    Feeling of Stress : Not at all  Social Connections: [place] (11/08/2018)   Social Connection and Isolation Panel [NHANES]    Frequency of Communication with Friends and Family: Three times a week    Frequency of Social Gatherings with Friends and Family: Three times a week    Attends Religious Services: 1 to 4 times per year    Active Member of Clubs or Organizations: Yes    Attends Archivist Meetings: 1 to 4 times per year    Marital Status: Living with partner  Intimate Partner Violence: Not At Risk (11/08/2018)   Humiliation, Afraid, Rape, and Kick questionnaire    Fear of Current or Ex-Partner: No    Emotionally Abused: No    Physically Abused: No    Sexually Abused: No    Family History  Problem Relation Age of Onset   Cancer Mother        colon Cancer   Hyperlipidemia Mother    Diabetes Mother    Heart disease Father    Hypertension Father    Cancer Sister        Uterine   Diabetes Sister    Diabetes Brother     Current Outpatient Medications  Medication Sig Dispense Refill   albuterol (VENTOLIN HFA) 108 (90 Base) MCG/ACT inhaler TAKE 2 PUFFS BY MOUTH EVERY  6 HOURS AS NEEDED FOR WHEEZE OR SHORTNESS OF BREATH 8.5 each 0   amLODipine (NORVASC) 5 MG tablet Take 1 tablet (5 mg total) by mouth at bedtime. 90 tablet 3   atorvastatin (LIPITOR) 40 MG tablet TAKE 1 TABLET BY MOUTH EVERY DAY 90 tablet 1   benazepril (LOTENSIN) 5 MG tablet TAKE 1 TABLET BY MOUTH EVERY DAY 90 tablet 3   clopidogrel (PLAVIX) 75 MG tablet TAKE 1 TABLET BY MOUTH EVERY DAY 90 tablet 0   diclofenac Sodium (VOLTAREN) 1 % GEL Apply 2 g topically 4 (four) times daily. 50 g 1   ezetimibe (ZETIA) 10 MG tablet TAKE 1 TABLET BY MOUTH EVERY DAY 90 tablet 1   ferrous sulfate 325 (65 FE) MG EC tablet Take 1 tablet (325 mg total) by mouth daily with breakfast. 90 tablet 1   FLUoxetine (PROZAC) 10 MG capsule TAKE 1 CAPSULE BY MOUTH EVERY DAY 90 capsule 1   gabapentin (NEURONTIN) 300 MG capsule TAKE 1 CAPSULE BY MOUTH THREE TIMES A DAY AS NEEDED 90 capsule 1   metFORMIN (GLUCOPHAGE) 1000 MG tablet TAKE 1 TABLET BY MOUTH TWICE A DAY WITH FOOD 180 tablet 1   Nebivolol HCl 20 MG TABS TAKE 1 TABLET BY MOUTH EVERY DAY 60 tablet 0   nicotine polacrilex (NICORETTE) 2 MG gum Take 1 each (2 mg total) by mouth as needed for smoking cessation. 100 tablet 3   pantoprazole (PROTONIX) 40 MG tablet TAKE 1 TABLET BY MOUTH EVERY DAY AS NEEDED 90 tablet 1   No current facility-administered medications for this visit.    Allergies  Allergen Reactions   Glipizide Other (See Comments)    REACTION IS SIDE EFFECT Severe hypoglycemia to 40s.      REVIEW OF SYSTEMS:  '[X]'$  denotes positive finding, '[ ]'$  denotes negative finding Cardiac  Comments:  Chest pain or chest pressure:    Shortness of breath upon exertion:    Short of breath when lying flat:  Irregular heart rhythm:        Vascular    Pain in calf, thigh, or hip brought on by ambulation:    Pain in feet at night that wakes you up from your sleep:     Blood clot in your veins:    Leg swelling:         Pulmonary    Oxygen at home:     Productive cough:     Wheezing:         Neurologic    Sudden weakness in arms or legs:     Sudden numbness in arms or legs:     Sudden onset of difficulty speaking or slurred speech:    Temporary loss of vision in one eye:     Problems with dizziness:         Gastrointestinal    Blood in stool:     Vomited blood:         Genitourinary    Burning when urinating:     Blood in urine:        Psychiatric    Major depression:         Hematologic    Bleeding problems:    Problems with blood clotting too easily:        Skin    Rashes or ulcers:        Constitutional    Fever or chills:      PHYSICAL EXAMINATION:  There were no vitals filed for this visit.  General:  WDWN in NAD; vital signs documented above Gait: Not observed, in wheel chair HENT: WNL, normocephalic Pulmonary: normal non-labored breathing , with wheezing Cardiac: regular HR, without  Murmurs without carotid bruit, subclavian bruits present Abdomen: soft, ND Vascular Exam/Pulses:  Right Left  Radial 2+ (normal) 2+ (normal)  Ulnar absent absent  DP absent AKA  PT absent AKA  Doppler PT and Peroneal signals on right leg Extremities: without ischemic changes, without Gangrene , without cellulitis; without open wounds; right leg warm and well perfused. Left AKA well healed, stump stocking in place Musculoskeletal: no muscle wasting or atrophy  Neurologic: A&O X 3;  No focal weakness or paresthesias are detected Psychiatric:  The pt has Normal affect.   Non-Invasive Vascular Imaging:   +-------+-----------+-----------+------------+------------+  ABI/TBIToday's ABIToday's TBIPrevious ABIPrevious TBI  +-------+-----------+-----------+------------+------------+  Right 0.71       0          0.95        0.39          +-------+-----------+-----------+------------+------------+  Left  AKA                   AKA                        +-------+-----------+-----------+------------+------------+   VAS US aorta/IVC/Iliacs: Abdominal Aorta Findings:  +-------------+-------+----------+----------+--------+--------+--------+  Location    AP (cm)Trans (cm)PSV (cm/s)WaveformThrombusComments  +-------------+-------+----------+----------+--------+--------+--------+  Proximal    2.76             37                                  +-------------+-------+----------+----------+--------+--------+--------+  Mid                          70                                  +-------------+-------+----------+----------+--------+--------+--------+  Distal                       42                                  +-------------+-------+----------+----------+--------+--------+--------+  RT EIA Prox                   176                                 +-------------+-------+----------+----------+--------+--------+--------+  RT EIA Mid                    285                                 +-------------+-------+----------+----------+--------+--------+--------+  RT EIA Distal                 222                                 +-------------+-------+----------+----------+--------+--------+--------+   Visualization of the Proximal Abdominal Aorta, Mid Abdominal Aorta, Distal Abdominal Aorta, Right CIA Proximal artery, Right CIA Mid artery, Right CIA Distal artery, Right EIA Proximal artery, Right EIA Mid artery, Right EIA Distal artery and Left CIA Proximal artery was limited.    Diffuse atherosclerosis of the aorta and Right Iliac arteries. Bilateral common iliac arteries could not be identified. Right external iliac artery stent was not positively identified.    Summary:  Stenosis: +--------------------+---------------+  Location            Stent            +--------------------+---------------+  Right External Iliac50-99% stenosis  +--------------------+---------------+     VAS US Carotid Duplex: Summary:  Right Carotid: Velocities in the right ICA are consistent with a 1-39% stenosis.Non-hemodynamically significant plaque <50% noted in the CCA. The ECA appears <50% stenosed.   Left Carotid: Velocities in the left ICA are consistent with a 40-59% stenosis.Non-hemodynamically significant plaque <50% noted in the CCA. The ECA appears <50% stenosed.   Vertebrals:  Left vertebral artery demonstrates antegrade flow. Right vertebral artery demonstrates bidirectional flow. Left Vertebral artery waveform is blunted suggestive of proximal stenosis.  Subclavians: Right subclavian artery was stenotic. Left subclavian artery flow was disturbed. Left Subclavian artery waveoform is blunted suggestive of proximal stenosis.   ASSESSMENT/PLAN:: 72 y.o. male here for follow up for PAD and carotid artery disease. He has no symptoms attributable to his carotid artery disease. He is not having any rest pain or tissue loss on the RLE. He does not ambulate enough to elicit claudication symptoms on the right leg.  - ABIs are decreased from prior but they are essentially unchanged from the ABIs prior to that. Overall stable - Aorto/iliac duplex does show some EIA elevated velocities but these are actually lower then previously seen on prior duplex. Again overall stable exam - Carotid duplex showing 1-39% ICA stenosis bilaterally. 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 He currently is not symptomatic from his subclavians -I have encouraged him to try and exercise his right leg as much as he is able.  - Unfortunately the phantom pains he is having in his left AKA are  still present and not really improved with his current medical management - I will have him return in 6 months for repeat ABI and Aorto/iliac duplex - He does not need his carotid duplex repeated for 2 years - he knows to call for earlier follow up should he have any new or concerning symptoms  Karoline Caldwell,  PA-C [facility] [phone]  Clinic MD:   Roxanne Mins

## 2023-01-25 ENCOUNTER — Other Ambulatory Visit: Payer: Self-pay

## 2023-01-25 DIAGNOSIS — I70229 Atherosclerosis of native arteries of extremities with rest pain, unspecified extremity: Secondary | ICD-10-CM

## 2023-01-25 DIAGNOSIS — I739 Peripheral vascular disease, unspecified: Secondary | ICD-10-CM

## 2023-01-28 ENCOUNTER — Other Ambulatory Visit: Payer: Self-pay | Admitting: Student

## 2023-01-28 DIAGNOSIS — I10 Essential (primary) hypertension: Secondary | ICD-10-CM

## 2023-02-07 ENCOUNTER — Other Ambulatory Visit: Payer: Self-pay | Admitting: Vascular Surgery

## 2023-02-07 ENCOUNTER — Other Ambulatory Visit: Payer: Self-pay | Admitting: Student

## 2023-02-07 DIAGNOSIS — E7800 Pure hypercholesterolemia, unspecified: Secondary | ICD-10-CM

## 2023-02-07 DIAGNOSIS — R0609 Other forms of dyspnea: Secondary | ICD-10-CM

## 2023-02-09 NOTE — Progress Notes (Signed)
I connected with  Jearld Adjutant on 02/10/2023  by a audio enabled telemedicine application and verified that I am speaking with the correct person using two identifiers.  Patient Location: Home  Provider Location: Home Office  I discussed the limitations of evaluation and management by telemedicine. The patient expressed understanding and agreed to proceed.  Subjective:   [name] is a 72 y.o. male who presents for Medicare Annual/Subsequent preventive examination.  Review of Systems    Per HPI unless specifically indicated below.  Cardiac Risk Factors include: advanced age (>41mn, >>15women);male gender, and  Primary Hypertension.          Objective:       02/10/2023   10:06 AM 01/24/2023   11:22 AM 01/05/2023    9:45 AM  Vitals with BMI  Height  5' 7"$    Systolic [id number] Diastolic 76 79 66  Pulse  65 78    Today's Vitals   02/10/23 1006 02/10/23 1015  BP: 130/76   PainSc:  10-Worst pain ever   There is no height or weight on file to calculate BMI.     02/10/2023   10:24 AM 10/26/2022   11:31 AM 10/06/2022    9:00 AM 09/05/2022    9:28 AM 03/15/2022    3:35 PM 03/03/2022    3:09 PM 11/26/2021    9:34 AM  Advanced Directives  Does Patient Have a Medical Advance Directive? No No No No No No No  Would patient like information on creating a medical advance directive? No - Patient declined No - Patient declined No - Patient declined No - Patient declined No - Patient declined No - Patient declined No - Patient declined    Current Medications (verified) Outpatient Encounter Medications as of 02/10/2023  Medication Sig   albuterol (VENTOLIN HFA) 108 (90 Base) MCG/ACT inhaler TAKE 2 PUFFS BY MOUTH EVERY 6 HOURS AS NEEDED FOR WHEEZE OR SHORTNESS OF BREATH   amLODipine (NORVASC) 5 MG tablet Take 1 tablet (5 mg total) by mouth at bedtime.   atorvastatin (LIPITOR) 40 MG tablet TAKE 1 TABLET BY MOUTH EVERY DAY   benazepril (LOTENSIN) 5 MG tablet TAKE 1 TABLET BY MOUTH  EVERY DAY   clopidogrel (PLAVIX) 75 MG tablet TAKE 1 TABLET BY MOUTH EVERY DAY   diclofenac Sodium (VOLTAREN) 1 % GEL Apply 2 g topically 4 (four) times daily.   ezetimibe (ZETIA) 10 MG tablet TAKE 1 TABLET BY MOUTH EVERY DAY   FLUoxetine (PROZAC) 10 MG capsule TAKE 1 CAPSULE BY MOUTH EVERY DAY   gabapentin (NEURONTIN) 300 MG capsule TAKE 1 CAPSULE BY MOUTH THREE TIMES A DAY AS NEEDED   metFORMIN (GLUCOPHAGE) 1000 MG tablet TAKE 1 TABLET BY MOUTH TWICE A DAY WITH FOOD   Nebivolol HCl 20 MG TABS TAKE 1 TABLET BY MOUTH EVERY DAY   nicotine polacrilex (NICORETTE) 2 MG gum Take 1 each (2 mg total) by mouth as needed for smoking cessation.   pantoprazole (PROTONIX) 40 MG tablet TAKE 1 TABLET BY MOUTH EVERY DAY AS NEEDED   ferrous sulfate 325 (65 FE) MG EC tablet Take 1 tablet (325 mg total) by mouth daily with breakfast. (Patient not taking: Reported on 02/10/2023)   No facility-administered encounter medications on file as of 02/10/2023.    Allergies (verified) Glipizide   History: Past Medical History:  Diagnosis Date   Adhesive capsulitis 03/10/2020   Anemia    Angiodysplasia of small intestine 03/29/2018   Enteroscopy was  significant for angiodysplasia 03/29/2018   Arthritis    OA   Cervical radiculopathy    Dr. Vertell Limber neurosurgery   Chronic lower back pain    Claudication of both lower extremities ([place]) 07/15/2015   Critical lower limb ischemia ([place]) 12/19/2019   Diabetes mellitus    takes Metformin daily   GERD (gastroesophageal reflux disease)    takes Protonix daily   History of blood transfusion    "related to low HgB" ((09/10/2015   Hyperlipidemia    takes Vytorin daily   Hypertension    takes Benazepril and Bystolic daily   PAD (peripheral artery disease) (HCC)    Pneumonia    Radiculopathy, cervical region 02/11/2017   Shortness of breath dyspnea    with exertion   Tobacco user    Toe fracture, right 05/09/2011   Wears glasses    Past Surgical History:  Procedure  Laterality Date   ABDOMINAL AORTOGRAM W/LOWER EXTREMITY N/A 12/11/2017   Procedure: ABDOMINAL AORTOGRAM W/LOWER EXTREMITY;  Surgeon: Angelia Mould, MD;  Location: [facility];  Service: Cardiovascular;  Laterality: N/A;   ABDOMINAL AORTOGRAM W/LOWER EXTREMITY Bilateral 04/22/2020   Procedure: ABDOMINAL AORTOGRAM W/LOWER EXTREMITY;  Surgeon: Marty Heck, MD;  Location: [facility];  Service: Cardiovascular;  Laterality: Bilateral;   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   AMPUTATION Left 02/19/2021   Procedure: LEFT ABOVE KNEE AMPUTATION;  Surgeon: Cherre Robins, MD;  Location: [facility];  Service: Vascular;  Laterality: Left;   ANTERIOR CERVICAL DECOMP/DISCECTOMY FUSION  03/08/12   C6-7   ANTERIOR CERVICAL DECOMP/DISCECTOMY FUSION  03/08/2012   Procedure: ANTERIOR CERVICAL DECOMPRESSION/DISCECTOMY FUSION 1 LEVEL/HARDWARE REMOVAL;  Surgeon: Erline Levine, MD;  Location: [facility];  Service: Neurosurgery;  Laterality: N/A;  revison of C5-7 anterior cervical decompression with fusion with Cervical Five-Thoracic One anterior cervical decompression with fusion with interbody prothesis plating and bonegraft   [place]   BIOPSY  12/05/2020   Procedure: BIOPSY;  Surgeon: Ladene Artist, MD;  Location: [facility];  Service: Endoscopy;;   BYPASS GRAFT FEMORAL-PERONEAL Left 11/11/2016   Procedure: REDO LEFT FEMORAL-PERONEAL BYPASS WITH PROPATEN 6MM X 80CM GRAFT;  Surgeon: Angelia Mould, MD;  Location: [facility];  Service: Vascular;  Laterality: Left;   COLONOSCOPY W/ BIOPSIES AND POLYPECTOMY  08/17/2012   f/u 5 years, 4 polyps, no high grade dysplasia or malignancy, tubular adenoma, hyperplastic polyops   COLONOSCOPY WITH PROPOFOL N/A 12/05/2020   Procedure: COLONOSCOPY WITH PROPOFOL;  Surgeon: Ladene Artist, MD;  Location: [facility];  Service: Endoscopy;  Laterality: N/A;   EMBOLECTOMY Left 12/19/2019   Procedure: LEFT FEMERAL- PERONEAL  THROMBECTOMY;  Surgeon: Marty Heck, MD;  Location: [facility];  Service: Vascular;  Laterality: Left;   ENDARTERECTOMY FEMORAL Right 04/24/2020   Procedure: RIGHT FEMORAL ENDARTERECTOMY;  Surgeon: Marty Heck, MD;  Location: [facility];  Service: Vascular;  Laterality: Right;   ENTEROSCOPY N/A 12/11/2015   Procedure: ENTEROSCOPY;  Surgeon: Carol Ada, MD;  Location: [facility];  Service: Endoscopy;  Laterality: N/A;   ENTEROSCOPY N/A 03/29/2018   Procedure: ENTEROSCOPY;  Surgeon: Carol Ada, MD;  Location: [facility];  Service: Endoscopy;  Laterality: N/A;   ESOPHAGOGASTRODUODENOSCOPY  08/17/2012   normal esophagus and GEJ, diffuse gastritis with erythema- no malignancy, reactive gastropathy  with focal intestinal metaplasia   ESOPHAGOGASTRODUODENOSCOPY (EGD) WITH PROPOFOL N/A 12/05/2020   Procedure: ESOPHAGOGASTRODUODENOSCOPY (EGD) WITH PROPOFOL;  Surgeon: Ladene Artist, MD;  Location: [facility];  Service: Endoscopy;  Laterality: N/A;   FEMORAL-POPLITEAL BYPASS GRAFT Left 01/06/2016   Procedure: Left  COMMON FEMORAL-BELOW KNEE POPLITEAL ARTERY Bypass using non-reversed translocated saphenous vein graft from left leg;  Surgeon: Mal Misty, MD;  Location: [facility];  Service: Vascular;  Laterality: Left;   GIVENS CAPSULE STUDY N/A 11/24/2015   Procedure: GIVENS CAPSULE STUDY;  Surgeon: Juanita Craver, MD;  Location: [facility];  Service: Endoscopy;  Laterality: N/A;   HOT HEMOSTASIS N/A 03/29/2018   Procedure: HOT HEMOSTASIS (ARGON PLASMA COAGULATION/BICAP);  Surgeon: Carol Ada, MD;  Location: [facility];  Service: Endoscopy;  Laterality: N/A;   INGUINAL HERNIA REPAIR  1990's   right   INTRAOPERATIVE ARTERIOGRAM Left 01/06/2016   Procedure: INTRA OPERATIVE ARTERIOGRAM LEFT LOWER LEG;  Surgeon: Mal Misty, MD;  Location: [facility];  Service: Vascular;  Laterality: Left;   INTRAOPERATIVE ARTERIOGRAM Left 11/11/2016   Procedure: INTRA OPERATIVE ARTERIOGRAM LEFT LOWER EXTRIMITY;   Surgeon: Angelia Mould, MD;  Location: [facility];  Service: Vascular;  Laterality: Left;   IR ANGIOGRAM FOLLOW UP STUDY  12/11/2017   IR GENERIC HISTORICAL  10/24/2016   IR ANGIOGRAM FOLLOW UP STUDY   LOWER EXTREMITY ANGIOGRAM Bilateral 07/30/2015   Procedure: Lower Extremity Angiogram;  Surgeon: Conrad Boynton, MD;  Location: [facility];  Service: Cardiovascular;  Laterality: Bilateral;   [place]   "lower"   PATCH ANGIOPLASTY Left 12/19/2019   Procedure: LEFT FEMORAL -PERONEAL PATCH ANGIOPLASTY WITH XENOSURE BIOLOGIC PATCH  ;  Surgeon: Marty Heck, MD;  Location: [facility];  Service: Vascular;  Laterality: Left;   PATCH ANGIOPLASTY Right 04/24/2020   Procedure: Patch Angioplasty of Right Femoral Artery using Long Xenosure Biologic Patch 1cm x 14 cm;  Surgeon: Marty Heck, MD;  Location: [facility];  Service: Vascular;  Laterality: Right;   PERIPHERAL VASCULAR CATHETERIZATION N/A 07/30/2015   Procedure: Abdominal Aortogram;  Surgeon: Conrad [place], MD;  Location: [facility];  Service: Cardiovascular;  Laterality: N/A;   PERIPHERAL VASCULAR CATHETERIZATION N/A 10/24/2016   Procedure: Abdominal Aortogram;  Surgeon: Angelia Mould, MD;  Location: [facility];  Service: Cardiovascular;  Laterality: N/A;   PERIPHERAL VASCULAR CATHETERIZATION N/A 10/24/2016   Procedure: Lower Extremity Angiography;  Surgeon: Angelia Mould, MD;  Location: [facility];  Service: Cardiovascular;  Laterality: N/A;   PERIPHERAL VASCULAR INTERVENTION Right 12/11/2017   Procedure: PERIPHERAL VASCULAR INTERVENTION;  Surgeon: Angelia Mould, MD;  Location: [facility];  Service: Cardiovascular;  Laterality: Right;   VASCULAR SURGERY  ~ 2007   Stent SFA    VEIN HARVEST Left 01/06/2016   Procedure: LEFT GREATER [place];  Surgeon: Mal Misty, MD;  Location: [facility];  Service: Vascular;  Laterality: Left;   Family History   Problem Relation Age of Onset   Cancer Mother        colon Cancer   Hyperlipidemia Mother    Diabetes Mother    Heart disease Father    Hypertension Father    Cancer Sister        Uterine   Diabetes Sister    Diabetes Brother    Social History   Socioeconomic History   Marital status: Widowed    Spouse name: Not on file   Number of children: 3   Years of education: 14   Highest education level: Some college, no degree  Occupational History   Occupation: Disable  Tobacco  Use   Smoking status: Some Days    Types: Cigars    Passive exposure: Current   Smokeless tobacco: Never  Vaping Use   Vaping Use: Never used  Substance and Sexual Activity   Alcohol use: Yes    Alcohol/week: 2.0 standard drinks of alcohol    Types: 2 Cans of beer per week   Drug use: No   Sexual activity: Yes  Other Topics Concern   Not on file  Social History Narrative   Lives alone. Lives in an apartment. Ground floor apartment. Smoke alarms present, no grab bars.    Has a shih-tzu, named Missy.    Patient eats a variety of foods. Drinks tea, Kool-aid, coffee      Use public transportation or brother will take places.   Some food insecurity.      Social Determinants of Health   Financial Resource Strain: Low Risk  (02/10/2023)   Overall Financial Resource Strain (CARDIA)    Difficulty of Paying Living Expenses: Not hard at all  Food Insecurity: No Food Insecurity (02/10/2023)   Hunger Vital Sign    Worried About Running Out of Food in the Last Year: Never true    Ran Out of Food in the Last Year: Never true  Transportation Needs: Unmet Transportation Needs (02/10/2023)   PRAPARE - Hydrologist (Medical): Yes    Lack of Transportation (Non-Medical): No  Physical Activity: Insufficiently Active (02/10/2023)   Exercise Vital Sign    Days of Exercise per Week: 7 days    Minutes of Exercise per Session: 10 min  Stress: No Stress Concern Present (02/10/2023)    [place]    Feeling of Stress : Not at all  Social Connections: Moderately Isolated (02/10/2023)   Social Connection and Isolation Panel [NHANES]    Frequency of Communication with Friends and Family: More than three times a week    Frequency of Social Gatherings with Friends and Family: Never    Attends Religious Services: Never    Marine scientist or Organizations: No    Attends Music therapist: Never    Marital Status: Living with partner    Tobacco Counseling Ready to quit: Not Answered Counseling given: No   Clinical Intake:  Pre-visit preparation completed: No  Pain : 0-10 Pain Score: 10-Worst pain ever Pain Type: Chronic pain Pain Location: Shoulder Pain Orientation: Left, Right Pain Descriptors / Indicators: Aching, Stabbing, Sharp, Throbbing Pain Frequency: Constant     Nutritional Status: BMI of 19-24  Normal Nutritional Risks: None Diabetes: Yes CBG done?: No Did pt. bring in CBG monitor from home?: No  How often do you need to have someone help you when you read instructions, pamphlets, or other written materials from your doctor or pharmacy?: 1 - Never  Diabetic?Nutrition Risk Assessment:  Has the patient had any N/V/D within the last 2 months?  No  Does the patient have any non-healing wounds?  No  Has the patient had any unintentional weight loss or weight gain?  No   Diabetes:  Is the patient diabetic?  Yes  If diabetic, was a CBG obtained today?  Yes  Did the patient bring in their glucometer from home?  No  How often do you monitor your CBG's? Twice daily .   Financial Strains and Diabetes Management:  Are you having any financial strains with the device, your supplies or your medication? No .  Does the patient want to be seen by Chronic Care Management for management of their diabetes?  No  Would the patient like to be referred to a Nutritionist or for  Diabetic Management?  No   Diabetic Exams:  Diabetic Eye Exam: Patient had eye exam on 09/05/2022, results pending. Diabetic Foot Exam: Completed 09/05/2022    Interpreter Needed?: No  Information entered by :: Donnie Mesa, [place]   Activities of Daily Living    02/10/2023   10:13 AM  In your present state of health, do you have any difficulty performing the following activities:  Hearing? 0  Vision? 1  Difficulty concentrating or making decisions? 0  Walking or climbing stairs? 1  Dressing or bathing? 0  Doing errands, shopping? 1    Patient Care Team: Precious Gilding, DO as PCP - General (Family Medicine) Carol Ada, MD as Consulting Physician (Gastroenterology) Juanita Craver, MD as Consulting Physician (Gastroenterology) Monna Fam, MD as Consulting Physician (Ophthalmology) Angelia Mould, MD as Consulting Physician (Vascular Surgery)  Indicate any recent Medical Services you may have received from other than [facility] providers in the past year (date may be approximate).     Assessment:   This is a routine wellness examination for [place].  Hearing/Vision screen Denies any hearing issues. Denies any vision changes. Annual Eye Exam  Dietary issues and exercise activities discussed: Current Exercise Habits: Home exercise routine, Type of exercise: stretching, Time (Minutes): 10, Frequency (Times/Week): 7, Weekly Exercise (Minutes/Week): 70, Intensity: Mild, Exercise limited by: orthopedic condition(s)   Goals Addressed   None    Depression Screen    02/10/2023   10:11 AM 10/06/2022    8:59 AM 10/05/2022    9:32 AM 09/05/2022    9:28 AM 03/15/2022    3:35 PM 03/03/2022    3:09 PM 08/17/2021    9:39 AM  PHQ 2/9 Scores  PHQ - 2 Score 1 0 0 0 0 0 0  PHQ- 9 Score  0  0 0 3 0    Fall Risk    02/10/2023   10:12 AM 10/06/2022    8:59 AM 09/05/2022    9:27 AM 03/15/2022    3:35 PM 03/03/2022    3:08 PM  Fall Risk   Falls in the past year? 1 1 0 0 0  Number  falls in past yr: 1 1 0 0 0  Injury with Fall? 1 1 0 0 0  Risk for fall due to : Impaired vision;Impaired balance/gait Impaired balance/gait;History of fall(s);Impaired mobility     Follow up Falls evaluation completed Falls evaluation completed;Education provided;Falls prevention discussed       FALL RISK PREVENTION PERTAINING TO THE HOME:  Any stairs in or around the home? No  If so, are there any without handrails? No  Home free of loose throw rugs in walkways, pet beds, electrical cords, etc? Yes  Adequate lighting in your home to reduce risk of falls? Yes   ASSISTIVE DEVICES UTILIZED TO PREVENT FALLS:  Life alert? Yes  Use of a cane, walker or w/c? Yes  Grab bars in the bathroom? No  Shower chair or bench in shower? Yes  Elevated toilet seat or a handicapped toilet? Yes   TIMED UP AND GO:  Was the test performed? Unable to perform, virtual appointment    Cognitive Function:    11/08/2018    3:45 PM  MMSE - Mini Mental State Exam  Orientation to time 5  Orientation to Place 5  Registration 3  Attention/ Calculation 5  Recall 3  Language- name 2 objects 2  Language- repeat 1  Language- follow 3 step command 3  Language- read & follow direction 1  Write a sentence 1  Copy design 1  Total score 30        02/10/2023   10:14 AM 11/08/2018    3:46 PM  6CIT Screen  What Year? 0 points 0 points  What month? 0 points 0 points  What time? 0 points 0 points  Count back from 20 0 points 0 points  Months in reverse 4 points 0 points  Repeat phrase 2 points 0 points  Total Score 6 points 0 points    Immunizations Immunization History  Administered Date(s) Administered   Influenza Split 11/30/2011, 11/07/2012   Influenza Whole 10/31/2007, 10/23/2008, 11/11/2009, 10/01/2010   Influenza, High Dose Seasonal PF 08/17/2022   Influenza,inj,Quad PF,6+ Mos 08/27/2014, 09/11/2015, 09/13/2016, 10/09/2017, 11/08/2018, 08/22/2019, 10/05/2020   Influenza-Unspecified  09/25/2013, 10/08/2021   PFIZER Comirnaty(Gray Top)Covid-19 Tri-Sucrose Vaccine 05/04/2021   PFIZER(Purple Top)SARS-COV-2 Vaccination 01/31/2020, 02/21/2020, 10/05/2020   Pfizer Covid-19 Vaccine Bivalent Booster 26yr & up 10/08/2021, 10/01/2022   Pneumococcal Conjugate-13 09/13/2016   Pneumococcal Polysaccharide-23 10/01/2010, 10/09/2017   Td 12/26/2001   Tdap 01/18/2012   Zoster Recombinat (Shingrix) 08/17/2022    TDAP status: Due, Education has been provided regarding the importance of this vaccine. Advised may receive this vaccine at local pharmacy or Health Dept. Aware to provide a copy of the vaccination record if obtained from local pharmacy or Health Dept. Verbalized acceptance and understanding.  Flu Vaccine status: Up to date  Pneumococcal vaccine status: Up to date  Covid-19 vaccine status: Information provided on how to obtain vaccines.   Qualifies for Shingles Vaccine? Yes   Zostavax completed No   Shingrix Completed?: No.    Education has been provided regarding the importance of this vaccine. Patient has been advised to call insurance company to determine out of pocket expense if they have not yet received this vaccine. Advised may also receive vaccine at local pharmacy or Health Dept. Verbalized acceptance and understanding.  Screening Tests Health Maintenance  Topic Date Due   DTaP/Tdap/Td (3 - Td or Tdap) 01/17/2022   OPHTHALMOLOGY EXAM  01/22/2022   Zoster Vaccines- Shingrix (2 of 2) 10/12/2022   COVID-19 Vaccine (7 - 2023-24 season) 11/26/2022   Diabetic kidney evaluation - eGFR measurement  03/04/2023   HEMOGLOBIN A1C  03/06/2023   FOOT EXAM  09/06/2023   Diabetic kidney evaluation - Urine ACR  10/07/2023   Medicare Annual Wellness (AWV)  02/11/2024   COLONOSCOPY (Pts 45-435yrInsurance coverage will need to be confirmed)  12/05/2025   Pneumonia Vaccine 6568Years old  Completed   INFLUENZA VACCINE  Completed   Hepatitis C Screening  Completed   HPV VACCINES   Aged Out    Health Maintenance  Health Maintenance Due  Topic Date Due   DTaP/Tdap/Td (3 - Td or Tdap) 01/17/2022   OPHTHALMOLOGY EXAM  01/22/2022   Zoster Vaccines- Shingrix (2 of 2) 10/12/2022   COVID-19 Vaccine (7 - 2023-24 season) 11/26/2022   Diabetic kidney evaluation - eGFR measurement  03/04/2023    Colorectal cancer screening: Type of screening: Colonoscopy. Completed 12/05/2020. Repeat every 5 years  Lung Cancer Screening: (Low Dose CT Chest recommended if Age 73-80ears, 30 pack-year currently smoking OR have quit w/in 15years.) does not qualify.   Lung Cancer Screening Referral: not applicable   Additional Screening:  Hepatitis C Screening: does qualify;  Completed 02/21/2017  Vision Screening: Recommended annual ophthalmology exams for early detection of glaucoma and other disorders of the eye. Is the patient up to date with their annual eye exam? Yes, the pt state he had an eye exam 12/2022, but could not recall the provider or practice.  Who is the provider or what is the name of the office in which the patient attends annual eye exams?  If pt is not established with a provider, would they like to be referred to a provider to establish care? No .   Dental Screening: Recommended annual dental exams for proper oral hygiene  Community Resource Referral / Chronic Care Management: CRR required this visit?  No   CCM required this visit?  No      Plan:     I have personally reviewed and noted the following in the patient's chart:   Medical and social history Use of alcohol, tobacco or illicit drugs  Current medications and supplements including opioid prescriptions. Patient is not currently taking opioid prescriptions. Functional ability and status Nutritional status Physical activity Advanced directives List of other physicians Hospitalizations, surgeries, and ER visits in previous 12 months Vitals Screenings to include cognitive, depression, and  falls Referrals and appointments  In addition, I have reviewed and discussed with patient certain preventive protocols, quality metrics, and best practice recommendations. A written personalized care plan for preventive services as well as general preventive health recommendations were provided to patient.    Mr. Duncan , Thank you for taking time to come for your Medicare Wellness Visit. I appreciate your ongoing commitment to your health goals. Please review the following plan we discussed and let me know if I can assist you in the future.   These are the goals we discussed:  Goals      HEMOGLOBIN A1C < 7.0     Increase water intake     Reduce sugar intake to X grams per day     Dilute sweet tea with half unsweet        This is a list of the screening recommended for you and due dates:  Health Maintenance  Topic Date Due   DTaP/Tdap/Td vaccine (3 - Td or Tdap) 01/17/2022   Eye exam for diabetics  01/22/2022   Zoster (Shingles) Vaccine (2 of 2) 10/12/2022   COVID-19 Vaccine (7 - 2023-24 season) 11/26/2022   Yearly kidney function blood test for diabetes  03/04/2023   Hemoglobin A1C  03/06/2023   Complete foot exam   09/06/2023   Yearly kidney health urinalysis for diabetes  10/07/2023   Medicare Annual Wellness Visit  02/11/2024   Colon Cancer Screening  12/05/2025   Pneumonia Vaccine  Completed   Flu Shot  Completed   Hepatitis C Screening: USPSTF Recommendation to screen - Ages 18-79 yo.  Completed   HPV Vaccine  Aged [address], [place]   02/10/2023  Nurse Notes: Approximately 30 minute Non-Face -To-Face Medicare Wellness. 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 Pt is using a manual wheelchair as main means of mobility. Pt has had 2 falls in past 6 months and has rotator cuff issues. He is requesting a Nurse, adult.

## 2023-02-09 NOTE — Patient Instructions (Signed)
Health Maintenance, Male Adopting a healthy lifestyle and getting preventive care are important in promoting health and wellness. Ask your health care provider about: The right schedule for you to have regular tests and exams. Things you can do on your own to prevent diseases and keep yourself healthy. What should I know about diet, weight, and exercise? Eat a healthy diet  Eat a diet that includes plenty of vegetables, fruits, low-fat dairy products, and lean protein. Do not eat a lot of foods that are high in solid fats, added sugars, or sodium. Maintain a healthy weight Body mass index (BMI) is a measurement that can be used to identify possible weight problems. It estimates body fat based on height and weight. Your health care provider can help determine your BMI and help you achieve or maintain a healthy weight. Get regular exercise Get regular exercise. This is one of the most important things you can do for your health. Most adults should: Exercise for at least 150 minutes each week. The exercise should increase your heart rate and make you sweat (moderate-intensity exercise). Do strengthening exercises at least twice a week. This is in addition to the moderate-intensity exercise. Spend less time sitting. Even light physical activity can be beneficial. Watch cholesterol and blood lipids Have your blood tested for lipids and cholesterol at 73 years of age, then have this test every 5 years. You may need to have your cholesterol levels checked more often if: Your lipid or cholesterol levels are high. You are older than 73 years of age. You are at high risk for heart disease. What should I know about cancer screening? Many types of cancers can be detected early and may often be prevented. Depending on your health history and family history, you may need to have cancer screening at various ages. This may include screening for: Colorectal cancer. Prostate cancer. Skin cancer. Lung  cancer. What should I know about heart disease, diabetes, and high blood pressure? Blood pressure and heart disease High blood pressure causes heart disease and increases the risk of stroke. This is more likely to develop in people who have high blood pressure readings or are overweight. Talk with your health care provider about your target blood pressure readings. Have your blood pressure checked: Every 3-5 years if you are 18-39 years of age. Every year if you are 40 years old or older. If you are between the ages of 65 and 75 and are a current or former smoker, ask your health care provider if you should have a one-time screening for abdominal aortic aneurysm (AAA). Diabetes Have regular diabetes screenings. This checks your fasting blood sugar level. Have the screening done: Once every three years after age 45 if you are at a normal weight and have a low risk for diabetes. More often and at a younger age if you are overweight or have a high risk for diabetes. What should I know about preventing infection? Hepatitis B If you have a higher risk for hepatitis B, you should be screened for this virus. Talk with your health care provider to find out if you are at risk for hepatitis B infection. Hepatitis C Blood testing is recommended for: Everyone born from 1945 through 1965. Anyone with known risk factors for hepatitis C. Sexually transmitted infections (STIs) You should be screened each year for STIs, including gonorrhea and chlamydia, if: You are sexually active and are younger than 73 years of age. You are older than 73 years of age and your   health care provider tells you that you are at risk for this type of infection. Your sexual activity has changed since you were last screened, and you are at increased risk for chlamydia or gonorrhea. Ask your health care provider if you are at risk. Ask your health care provider about whether you are at high risk for HIV. Your health care provider  may recommend a prescription medicine to help prevent HIV infection. If you choose to take medicine to prevent HIV, you should first get tested for HIV. You should then be tested every 3 months for as long as you are taking the medicine. Follow these instructions at home: Alcohol use Do not drink alcohol if your health care provider tells you not to drink. If you drink alcohol: Limit how much you have to 0-2 drinks a day. Know how much alcohol is in your drink. In the [place] one drink equals one 12 oz bottle of beer (355 mL), one 5 oz glass of wine (148 mL), or one 1 oz glass of hard liquor (44 mL). Lifestyle Do not use any products that contain nicotine or tobacco. These products include cigarettes, chewing tobacco, and vaping devices, such as e-cigarettes. If you need help quitting, ask your health care provider. Do not use street drugs. Do not share needles. Ask your health care provider for help if you need support or information about quitting drugs. General instructions Schedule regular health, dental, and eye exams. Stay current with your vaccines. Tell your health care provider if: You often feel depressed. You have ever been abused or do not feel safe at home. Summary Adopting a healthy lifestyle and getting preventive care are important in promoting health and wellness. Follow your health care provider's instructions about healthy diet, exercising, and getting tested or screened for diseases. Follow your health care provider's instructions on monitoring your cholesterol and blood pressure. This information is not intended to replace advice given to you by your health care provider. Make sure you discuss any questions you have with your health care provider. Document Revised: 05/03/2021 Document Reviewed: 05/03/2021 Elsevier Patient Education  2023 Elsevier Inc.  

## 2023-02-10 ENCOUNTER — Ambulatory Visit (INDEPENDENT_AMBULATORY_CARE_PROVIDER_SITE_OTHER): Payer: Medicare HMO

## 2023-02-10 ENCOUNTER — Telehealth: Payer: Self-pay | Admitting: *Deleted

## 2023-02-10 VITALS — BP 130/76

## 2023-02-10 DIAGNOSIS — Z5982 Transportation insecurity: Secondary | ICD-10-CM | POA: Diagnosis not present

## 2023-02-10 DIAGNOSIS — Z0000 Encounter for general adult medical examination without abnormal findings: Secondary | ICD-10-CM | POA: Diagnosis not present

## 2023-02-10 NOTE — Telephone Encounter (Signed)
I received a call from Roma with [employer].  She states that form for power wheelchair was signed by resident and then faculty but it wasn't Dr. Owens Shark who signed off on the office note that was provided with the form.  Caren Griffins will make a note to reach back out to [place] in March to have Dr. Owens Shark sign the power wheelchair form when she returns OR we can schedule patient for a virtual video/telephone visit and that preceptor would have to sign off on the form.  Please call Caren Griffins back at [phone], if a new visit is done and she will resend a new form to be signed.    Thanks [employer]

## 2023-02-15 ENCOUNTER — Telehealth: Payer: Self-pay | Admitting: *Deleted

## 2023-02-15 NOTE — Telephone Encounter (Signed)
   Telephone encounter was:  Unsuccessful.  02/15/2023 Name: [name] MRN: [id number] DOB: [birth year]  Unsuccessful outbound call made today to assist with:  Home Modifications  Outreach Attempt:  1st Attempt  A HIPAA compliant voice message was left requesting a return call.  Instructed patient to call back at [phone]. [place] [phone] [address] , [place] [address] Email : [email or web address]

## 2023-02-20 ENCOUNTER — Ambulatory Visit: Payer: Medicare HMO | Admitting: Family Medicine

## 2023-02-20 ENCOUNTER — Telehealth: Payer: Self-pay | Admitting: *Deleted

## 2023-02-20 NOTE — Telephone Encounter (Signed)
   Telephone encounter was:  Successful.  02/20/2023 Name: [name] MRN: [id number] DOB: March 03, 1951  [name] is a 72 y.o. year old male who is a primary care patient of Precious Gilding, DO . The community resource team was consulted for assistance with Transportation Needs  Patient has ride for this appt will reach out when he has another appt and will assist him with booking transportation through his [employer]/ medicaid transportation  Care guide performed the following interventions: Patient provided with information about care guide support team and interviewed to confirm resource needs.  Follow Up Plan:  No further follow up planned at this time. The patient has been provided with needed resources.  [place] [phone] [address] , [place] [address] Email : [email or web address]

## 2023-02-23 ENCOUNTER — Encounter: Payer: Self-pay | Admitting: Family Medicine

## 2023-02-23 ENCOUNTER — Ambulatory Visit (INDEPENDENT_AMBULATORY_CARE_PROVIDER_SITE_OTHER): Payer: Medicare HMO | Admitting: Family Medicine

## 2023-02-23 VITALS — BP 112/70 | HR 65 | Ht 67.0 in | Wt 197.6 lb

## 2023-02-23 DIAGNOSIS — R1011 Right upper quadrant pain: Secondary | ICD-10-CM | POA: Diagnosis not present

## 2023-02-23 MED ORDER — POLYETHYLENE GLYCOL 3350 17 GM/SCOOP PO POWD: 17.0000 g | Freq: Every day | ORAL | 0 refills | Status: DC

## 2023-02-23 NOTE — Patient Instructions (Addendum)
It was nice seeing you today!  Take Miralax 1-2 times a day until you have soft stools.  Go to your ultrasound as scheduled.  Checking blood work.  Stay well, Zola Button, MD [place] [phone]  --  Make sure to check out at the front desk before you leave today.  Please arrive at least 15 minutes prior to your scheduled appointments.  If you had blood work today, I will send you a MyChart message or a letter if results are normal. Otherwise, I will give you a call.  If you had a referral placed, they will call you to set up an appointment. Please give [place] a call if you don't hear back in the next 2 weeks.  If you need additional refills before your next appointment, please call your pharmacy first.

## 2023-02-23 NOTE — Progress Notes (Signed)
    SUBJECTIVE:   CHIEF COMPLAINT / HPI:  Chief Complaint  Patient presents with   Abdominal Pain   Constipation    Intermittent abdominal pain, postprandial x 2 weeks, feels pain as he is eating Switched to drinking mostly soup. Tried to switch back to solids but was having pain Improves with passing gas. OTC gas pill not helping much Stopped iron supplement when he started having pain Having some dark/black stools, no frank blood Last BM today, having hard stools Denies nausea, vomiting, fever, chills, lightheadedness, dizziness, SOB He is on clopidogrel No recent NSAID use Smokes cigars  Most recent visit with GI 10/2022 with Dr. Juanita Craver for colonoscopy discussion which has not been done, apparently he was told she wouldn't do the colonoscopy due to his amputee status  PERTINENT  PMH / PSH: Depression, PAD, HTN, iron deficiency anemia No history of abdominal surgeries EGD and colonoscopy 11/2020 showed internal hemorrhoids but otherwise nothing remarkable  Patient Care Team: Precious Gilding, DO as PCP - General (Family Medicine) Carol Ada, MD as Consulting Physician (Gastroenterology) Juanita Craver, MD as Consulting Physician (Gastroenterology) Monna Fam, MD as Consulting Physician (Ophthalmology) Angelia Mould, MD as Consulting Physician (Vascular Surgery)   OBJECTIVE:   BP 112/70   Pulse 65   Ht 5' 7"$  (1.702 m)   Wt 197 lb 9.6 oz (89.6 kg)   SpO2 100%   BMI 30.95 kg/m   Physical Exam Constitutional:      General: He is not in acute distress.    Comments: Seated in wheelchair, s/p left AKA  Cardiovascular:     Rate and Rhythm: Normal rate and regular rhythm.  Pulmonary:     Effort: Pulmonary effort is normal. No respiratory distress.     Breath sounds: Normal breath sounds.  Abdominal:     General: Bowel sounds are normal.     Palpations: Abdomen is soft.     Tenderness: There is abdominal tenderness.     Comments: Mild tenderness  appreciated in the right upper quadrant.  Murphy sign is positive.  Neurological:     Mental Status: He is alert.         02/23/2023    9:57 AM  Depression screen PHQ 2/9  Decreased Interest 0  Down, Depressed, Hopeless 0  PHQ - 2 Score 0  Altered sleeping 0  Tired, decreased energy 2  Change in appetite 1  Feeling bad or failure about yourself  0  Trouble concentrating 0  Moving slowly or fidgety/restless 0  Suicidal thoughts 0  PHQ-9 Score 3  Difficult doing work/chores Somewhat difficult     {Show previous vital signs (optional):23777}    ASSESSMENT/PLAN:   1. Right upper quadrant abdominal pain Intermittent RUQ abdominal pain ongoing for 2 weeks.  Differential includes PUD, symptomatic cholelithiasis, cholecystitis, pancreatitis, hepatitis.  Evaluate for anemia given melena, though could be from iron supplementation.  Check labs and right upper quadrant ultrasound, further workup pending results. - CBC - Comprehensive metabolic panel - Lipase - US Abdomen Limited RUQ (LIVER/GB); Future    Return if symptoms worsen or fail to improve.   Zola Button, MD [place]

## 2023-02-24 LAB — COMPREHENSIVE METABOLIC PANEL
ALT: 8 IU/L (ref 0–44)
AST: 14 IU/L (ref 0–40)
Albumin/Globulin Ratio: 1.4 (ref 1.2–2.2)
Albumin: 4.2 g/dL (ref 3.8–4.8)
Alkaline Phosphatase: 107 IU/L (ref 44–121)
BUN/Creatinine Ratio: 10 (ref 10–24)
BUN: 8 mg/dL (ref 8–27)
Bilirubin Total: 0.2 mg/dL (ref 0.0–1.2)
CO2: 27 mmol/L (ref 20–29)
Calcium: 9.2 mg/dL (ref 8.6–10.2)
Chloride: 97 mmol/L (ref 96–106)
Creatinine, Ser: 0.79 mg/dL (ref 0.76–1.27)
Globulin, Total: 2.9 g/dL (ref 1.5–4.5)
Glucose: 128 mg/dL — ABNORMAL HIGH (ref 70–99)
Potassium: 4.6 mmol/L (ref 3.5–5.2)
Sodium: 136 mmol/L (ref 134–144)
Total Protein: 7.1 g/dL (ref 6.0–8.5)
eGFR: 94 mL/min/{1.73_m2} (ref >59)

## 2023-02-24 LAB — CBC
Hematocrit: 34.6 % — ABNORMAL LOW (ref 37.5–51.0)
Hemoglobin: 9.9 g/dL — ABNORMAL LOW (ref 13.0–17.7)
MCH: 19.2 pg — ABNORMAL LOW (ref 26.6–33.0)
MCHC: 28.6 g/dL — ABNORMAL LOW (ref 31.5–35.7)
MCV: 67 fL — ABNORMAL LOW (ref 79–97)
Platelets: 362 10*3/uL (ref 150–450)
RBC: 5.15 x10E6/uL (ref 4.14–5.80)
RDW: 18.3 % — ABNORMAL HIGH (ref 11.6–15.4)
WBC: 5.3 10*3/uL (ref 3.4–10.8)

## 2023-02-24 LAB — LIPASE: Lipase: 52 U/L (ref 13–78)

## 2023-03-02 ENCOUNTER — Ambulatory Visit (HOSPITAL_COMMUNITY)
Admission: RE | Admit: 2023-03-02 | Discharge: 2023-03-02 | Disposition: A | Discharge status: Home / Self Care | Payer: Medicare HMO | Source: Ambulatory Visit | Attending: Family Medicine | Admitting: Family Medicine

## 2023-03-02 ENCOUNTER — Telehealth: Payer: Self-pay

## 2023-03-02 DIAGNOSIS — R1011 Right upper quadrant pain: Secondary | ICD-10-CM | POA: Insufficient documentation

## 2023-03-02 NOTE — Telephone Encounter (Signed)
Received call from [facility] at [employer] in regards to patients wheelchair order.   She reports they are needing updated office notes. The ones faxed previously expire on Saturday. I have faxed over October office visit notes with mention of amputation.   She reports it is imperative Owens Shark signs the original order for insurance. I advised she is on maternity leave, however will return on 3/11. Caren Griffins reports she will fax document on 3/11 for [place] to sign.

## 2023-03-11 ENCOUNTER — Other Ambulatory Visit: Payer: Self-pay | Admitting: Student

## 2023-03-11 DIAGNOSIS — K219 Gastro-esophageal reflux disease without esophagitis: Secondary | ICD-10-CM

## 2023-03-11 DIAGNOSIS — R0609 Other forms of dyspnea: Secondary | ICD-10-CM

## 2023-03-13 ENCOUNTER — Telehealth: Payer: Self-pay

## 2023-03-13 NOTE — Telephone Encounter (Signed)
Pt called c/o pain when he puts his prosthetic on.  Reviewed pt's chart, returned call for clarification, no answer, lf vm instructing pt to call [facility] to see if there is a problem with the prosthetic. If not, they can send a referral to this office for an appt.

## 2023-03-15 NOTE — Telephone Encounter (Signed)
Patient left message on referral line stating that he needs a new order for a wheelchair because his brakes aren't working.  Will forward to MD.  [name]

## 2023-03-21 ENCOUNTER — Telehealth: Payer: Self-pay | Admitting: Student

## 2023-03-21 NOTE — Telephone Encounter (Signed)
Called patient to discuss status of current wheelchairs.  States he does have a wheelchair currently and the brakes are working.  He has not yet heard anything about updates on getting a power wheelchair.  Our office has completed and faxed off the necessary paperwork.  Advised patient I can look in to getting a phone number for the power wheelchair company so that we can get an update.

## 2023-04-04 DIAGNOSIS — Z9181 History of falling: Secondary | ICD-10-CM | POA: Diagnosis not present

## 2023-04-04 DIAGNOSIS — M75100 Unspecified rotator cuff tear or rupture of unspecified shoulder, not specified as traumatic: Secondary | ICD-10-CM | POA: Diagnosis not present

## 2023-04-04 DIAGNOSIS — Z89612 Acquired absence of left leg above knee: Secondary | ICD-10-CM | POA: Diagnosis not present

## 2023-04-04 DIAGNOSIS — I739 Peripheral vascular disease, unspecified: Secondary | ICD-10-CM | POA: Diagnosis not present

## 2023-04-06 ENCOUNTER — Ambulatory Visit: Payer: Medicare HMO | Admitting: Podiatry

## 2023-04-20 ENCOUNTER — Ambulatory Visit (INDEPENDENT_AMBULATORY_CARE_PROVIDER_SITE_OTHER): Payer: Medicare HMO | Admitting: Podiatry

## 2023-04-20 DIAGNOSIS — E1142 Type 2 diabetes mellitus with diabetic polyneuropathy: Secondary | ICD-10-CM

## 2023-04-20 DIAGNOSIS — Z7984 Long term (current) use of oral hypoglycemic drugs: Secondary | ICD-10-CM

## 2023-04-20 DIAGNOSIS — I739 Peripheral vascular disease, unspecified: Secondary | ICD-10-CM | POA: Diagnosis not present

## 2023-04-20 DIAGNOSIS — B351 Tinea unguium: Secondary | ICD-10-CM

## 2023-04-20 DIAGNOSIS — M79674 Pain in right toe(s): Secondary | ICD-10-CM

## 2023-04-20 DIAGNOSIS — E119 Type 2 diabetes mellitus without complications: Secondary | ICD-10-CM

## 2023-04-20 DIAGNOSIS — L03031 Cellulitis of right toe: Secondary | ICD-10-CM

## 2023-04-20 DIAGNOSIS — Z89612 Acquired absence of left leg above knee: Secondary | ICD-10-CM

## 2023-04-20 DIAGNOSIS — E1151 Type 2 diabetes mellitus with diabetic peripheral angiopathy without gangrene: Secondary | ICD-10-CM | POA: Diagnosis not present

## 2023-04-20 DIAGNOSIS — I70209 Unspecified atherosclerosis of native arteries of extremities, unspecified extremity: Secondary | ICD-10-CM | POA: Diagnosis not present

## 2023-04-20 MED ORDER — AMOXICILLIN-POT CLAVULANATE 875-125 MG PO TABS: 1.0000 | ORAL_TABLET | Freq: Two times a day (BID) | ORAL | 0 refills | Status: DC

## 2023-04-20 NOTE — Patient Instructions (Signed)

## 2023-04-20 NOTE — Progress Notes (Addendum)
  Subjective:  Patient ID: [name], male    DOB: 05-01-1951,  MRN: [id number]  Chief Complaint  Patient presents with   Nail Problem    DFC    72 y.o. male presents with the above complaint. History confirmed with patient.  Patient presents the office today for evaluation of painful nails on the right foot.  He has a history of a above-knee amputation of the lower extremity on the left side related to peripheral arterial disease.  He has painful thickened elongated and abnormally growing nails x5 present on the right foot.  He is unable to trim them himself due to the thickness.  Pt also notes some tenderness in the right foot 2nd toe at the nail border.  Objective:  Physical Exam: warm, good capillary refill, nail exam onychomycosis of the toenails, onycholysis, and dystrophic nails x5 on the right foot, no trophic changes or ulcerative lesions.  DP and PT pulses nonpalpable.  Protective sensation is absent to the forefoot. Left Foot: Status post above-knee amputation Right Foot: No ulcerations or hyperkeratotic lesions present plantar right foot. There is mild erythema and fibrotic tissue at the lateral border of the 2nd toenail.  No images are attached to the encounter.  Assessment:   1. Pain due to onychomycosis of toenails of both feet   2. PAD (peripheral artery disease)   3. 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 DM type 2 with diabetic peripheral neuropathy   5. S/P above knee amputation, left   6. Diabetes mellitus treated with oral medication   7. Paronychia of second toe, right   8. Pain due to onychomycosis of toenail of right foot      Plan:  Patient was evaluated and treated and all questions answered.  # Paronychia of the right second toe lateral border -Discussed with patient that he does appear to have mild paronychia or nailbed infection on the right lateral aspect of the second toenail.  There is no clear ingrown I  believe the toenail was just too long. -I debrided the nail which was painful for the patient but afterwards there was some resolution of the pain. -Recommend Epsom salt soaks twice a day for the next 2 weeks followed by ointment and a Band-Aid -I recommend prophylactic antibiotic Augmentin 875-125 mg twice daily for the next 7 days. -Patient will return in 2 weeks to recheck this area if we need to perform a total nail avulsion at that time we will consider doing so  Onychomycosis with pain  -Nails palliatively debrided as below. -Educated on self-care  Procedure: Nail Debridement Rationale: Pain Type of Debridement: manual, sharp debridement. Instrumentation: Nail nipper, rotary burr. Number of Nails: 5  Return in about 2 weeks (around 05/04/2023) for F/u R 2nd toenail paornychia.         Corinna Gab, DPM [facility] / [facility]

## 2023-04-20 NOTE — Addendum Note (Signed)
Addended by: Carlena Hurl F on: 04/20/2023 01:27 PM   Modules accepted: Orders, Level of Service

## 2023-04-25 ENCOUNTER — Telehealth: Payer: Self-pay | Admitting: *Deleted

## 2023-04-25 NOTE — Telephone Encounter (Signed)
Patient has been updated, verbalized understanding. 

## 2023-04-25 NOTE — Telephone Encounter (Signed)
Patient is calling because while he was soaking the nail that he was seen for has come off,no bleeding but painful, is covering at the moment with a bandage and has an upcoming appointment in 2 weeks for his f/u.Please advise.

## 2023-04-30 ENCOUNTER — Other Ambulatory Visit: Payer: Self-pay | Admitting: Student

## 2023-04-30 ENCOUNTER — Other Ambulatory Visit: Payer: Self-pay | Admitting: Physician Assistant

## 2023-04-30 DIAGNOSIS — I10 Essential (primary) hypertension: Secondary | ICD-10-CM

## 2023-05-04 ENCOUNTER — Ambulatory Visit (INDEPENDENT_AMBULATORY_CARE_PROVIDER_SITE_OTHER): Payer: Medicare HMO | Admitting: Podiatry

## 2023-05-04 DIAGNOSIS — Z89612 Acquired absence of left leg above knee: Secondary | ICD-10-CM | POA: Diagnosis not present

## 2023-05-04 DIAGNOSIS — E119 Type 2 diabetes mellitus without complications: Secondary | ICD-10-CM

## 2023-05-04 DIAGNOSIS — L03031 Cellulitis of right toe: Secondary | ICD-10-CM | POA: Diagnosis not present

## 2023-05-04 DIAGNOSIS — Z7984 Long term (current) use of oral hypoglycemic drugs: Secondary | ICD-10-CM | POA: Diagnosis not present

## 2023-05-04 NOTE — Progress Notes (Signed)
  Subjective:  Patient ID: [name], male    DOB: 06-Jan-1951,  MRN: [id number]  Chief Complaint  Patient presents with   Nail Problem    2nd toenail right fell off. Still soaking 2nd toe.     72 y.o. male presents with the above complaint. History confirmed with patient.  Patient is presenting for follow-up of right second toe possible ingrown versus paronychia.  He says that the nail fell off recently.  He has been soaking and using antibiotic ointment and Band-Aid for dressing.  He denies drainage denies nausea vomiting fever chills.  Objective:  Physical Exam: warm, good capillary refill, nail exam onychomycosis of the toenails, onycholysis, and dystrophic nails x5 on the right foot, no trophic changes or ulcerative lesions.  DP and PT pulses nonpalpable.  Protective sensation is absent to the forefoot. Left Foot: Status post above-knee amputation Right Foot: No ulcerations or hyperkeratotic lesions present plantar right foot.  Decreased erythema and no fibrotic tissue at the distal tuft of the right second toe.  The nail has completely come off.  There is small circular 1 to 2 mm fibrotic wounds present at the nailbed but nothing deep and no drainage.   No images are attached to the encounter.  Assessment:   1. Paronychia of second toe, right   2. Diabetes mellitus treated with oral medication (HCC)   3. S/P above knee amputation, left (HCC)       Plan:  Patient was evaluated and treated and all questions answered.  # Paronychia of the right second toe -improved after toenail fell off on its own -Decreased evidence of infection at this visit no erythema or edema -Decreased pain at the nail bed which is healthy -Recommend Epsom salt soaks once a day for the next 2 weeks followed by Betadine ointment and a Band-Aid -Patient's status post course of prophylactic Augmentin 875-125, no further antibiotics indicated -Patient will return in 4 weeks to confirm the nailbed is fully  healed   No follow-ups on file.         Corinna Gab, DPM [facility] / [facility]

## 2023-05-28 ENCOUNTER — Other Ambulatory Visit: Payer: Self-pay | Admitting: Student

## 2023-05-28 DIAGNOSIS — R0609 Other forms of dyspnea: Secondary | ICD-10-CM

## 2023-06-01 ENCOUNTER — Ambulatory Visit (INDEPENDENT_AMBULATORY_CARE_PROVIDER_SITE_OTHER): Payer: Medicare HMO | Admitting: Podiatry

## 2023-06-01 DIAGNOSIS — Z7984 Long term (current) use of oral hypoglycemic drugs: Secondary | ICD-10-CM | POA: Diagnosis not present

## 2023-06-01 DIAGNOSIS — Z89612 Acquired absence of left leg above knee: Secondary | ICD-10-CM

## 2023-06-01 DIAGNOSIS — E119 Type 2 diabetes mellitus without complications: Secondary | ICD-10-CM

## 2023-06-01 DIAGNOSIS — L03031 Cellulitis of right toe: Secondary | ICD-10-CM

## 2023-06-01 DIAGNOSIS — I739 Peripheral vascular disease, unspecified: Secondary | ICD-10-CM

## 2023-06-01 MED ORDER — GABAPENTIN 100 MG PO CAPS: 100.0000 mg | ORAL_CAPSULE | Freq: Three times a day (TID) | ORAL | 3 refills | Status: DC

## 2023-06-01 NOTE — Progress Notes (Signed)
  Subjective:  Patient ID: [name], male    DOB: Mar 30, 1951,  MRN: [id number]  Chief Complaint  Patient presents with   Ingrown Toenail    Rm 15 Follow up 4 week nail check. Pt states he still has some tenderness to the toe. Pt also complains of burning sensation at the top of his right foot x 1 week.     72 y.o. male presents for follow-up of nail issue and paronychia of the second toe.  He states that overall has been doing better.  He is decreased pain at the area he does have some burning sensation on the top of his right foot.  Does have known neuropathy and does take gabapentin 300 mg 3 times a day  Objective:  Physical Exam: warm, good capillary refill, nail exam onychomycosis of the toenails, onycholysis, and dystrophic nails x5 on the right foot, no trophic changes or ulcerative lesions.  DP and PT pulses nonpalpable.  Protective sensation is absent to the forefoot. Left Foot: Status post above-knee amputation Right Foot: No ulcerations or hyperkeratotic lesions present plantar right foot.  Decreased erythema and no fibrotic tissue at the distal tuft of the right second toe.  There is healing at the nailbed with decreased maceration and dry eschar present no evidence of infection   No images are attached to the encounter.  Assessment:   1. Paronychia of second toe, right   2. S/P above knee amputation, left (HCC)   3. PAD (peripheral artery disease) (HCC)   4. Diabetes mellitus treated with oral medication (HCC)     Plan:  Patient was evaluated and treated and all questions answered.  # Paronychia of the right second toe -resolving after antibiotics and wound care -Overall the second toe nailbed is healing without evidence of complication at this time -Decreased pain at the nail bed which is healthy -Believe his pain in the midfoot is likely neuropathy pathic pain -I recommend we proceed with gabapentin 100 mg 3 times a day in addition to his 300 mg tablet as needed  for additional therapeutic effect -Recommend Epsom salt soaks once a day for the next 7 days as needed does not need to put any ointment or Band-Aid on the toe at this time    Return in about 2 months (around 08/01/2023) for Routine diabetic footcare.         Corinna Gab, DPM [facility] / [facility]

## 2023-06-06 NOTE — Progress Notes (Deleted)
    SUBJECTIVE:   CHIEF COMPLAINT / HPI:   *** Microcytic Anemia  Hgb 02/23/23 9.9 MCV 67.   PERTINENT  PMH / PSH: ***  OBJECTIVE:   There were no vitals taken for this visit. ***  General: NAD, pleasant, able to participate in exam Cardiac: RRR, no murmurs. Respiratory: CTAB, normal effort, No wheezes, rales or rhonchi Abdomen: Bowel sounds present, nontender, nondistended, no hepatosplenomegaly. Extremities: no edema or cyanosis. Skin: warm and dry, no rashes noted Neuro: alert, no obvious focal deficits Psych: Normal affect and mood  ASSESSMENT/PLAN:   No problem-specific Assessment & Plan notes found for this encounter.     Dr. Erick Alley, DO [place] [facility]    {    This will disappear when note is signed, click to select method of visit    :1}

## 2023-06-07 ENCOUNTER — Ambulatory Visit: Payer: Medicare HMO | Admitting: Student

## 2023-06-07 DIAGNOSIS — E1151 Type 2 diabetes mellitus with diabetic peripheral angiopathy without gangrene: Secondary | ICD-10-CM

## 2023-06-14 ENCOUNTER — Encounter: Payer: Self-pay | Admitting: Student

## 2023-06-14 ENCOUNTER — Ambulatory Visit (INDEPENDENT_AMBULATORY_CARE_PROVIDER_SITE_OTHER): Payer: Medicare HMO | Admitting: Student

## 2023-06-14 VITALS — BP 116/74 | HR 71 | Ht 67.0 in | Wt 190.0 lb

## 2023-06-14 DIAGNOSIS — M25511 Pain in right shoulder: Secondary | ICD-10-CM | POA: Diagnosis not present

## 2023-06-14 DIAGNOSIS — D500 Iron deficiency anemia secondary to blood loss (chronic): Secondary | ICD-10-CM

## 2023-06-14 DIAGNOSIS — K219 Gastro-esophageal reflux disease without esophagitis: Secondary | ICD-10-CM

## 2023-06-14 DIAGNOSIS — M25512 Pain in left shoulder: Secondary | ICD-10-CM

## 2023-06-14 MED ORDER — NAPROXEN 500 MG PO TABS: 500.0000 mg | ORAL_TABLET | Freq: Two times a day (BID) | ORAL | 0 refills | Status: DC

## 2023-06-14 MED ORDER — BACLOFEN 10 MG PO TABS: 5.0000 mg | ORAL_TABLET | Freq: Two times a day (BID) | ORAL | 0 refills | Status: DC | PRN

## 2023-06-14 NOTE — Progress Notes (Addendum)
    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 Worsening over the past 4 months, can't hardly scramble an egg or drink coffee. Pain is also in neck. No numbness or tinglings in arms/fingers.  Has tried multple OTC topicals - Voltaren (4 x/day), roll on pain relievers. Salonpas patches help some. Taking Aleve q6h.   PERTINENT  PMH / PSH: Chronic shoulder pain   OBJECTIVE:   BP 116/74   Pulse 71   Ht 5\' 7"  (1.702 m)   Wt 190 lb (86.2 kg) Comment: Pt reported  SpO2 98%   BMI 29.76 kg/m    General: NAD, pleasant, able to participate in exam Cardiac: Well-perfused Respiratory: Breathing comfortably on room air MSK: No obvious deformity or edema of BUEs.  Decreased ROM of neck rotation and sidebending  due to pain, TTP of posterior cervical muscles, bilateral levator scapula and traps, decreased abduction and flexion of shoulders due to pain, is able to abduct past 90 degrees. Skin: warm and dry Neuro: alert, no obvious focal deficits Psych: Normal affect and mood  ASSESSMENT/PLAN:   Bilateral shoulder pain Mixture of pain due to chronic rotator cuff injury and OA but also has muscular pain of shoulder blades and neck likely d/t muscle strain. Initial plan was to trial course of naproxen however on chart review, there was concern in February for GI bleed causing iron deficiency anemia. He did see GI but I am unable to see their notes. Pt advised to avoid NSAID use unless it is confirmed he is not at risk for GI bleed. -Baclofen 5 mg twice daily prn -Can continue with OTC topicals -Can take Tylenol prn -PT referral placed, may benefit from dry needling   Patient to return over the next 2 to 4 weeks for f/u and check hemoglobin A1c.   Dr. Erick Alley, DO [place] [facility]

## 2023-06-14 NOTE — Patient Instructions (Addendum)
It was great to see you! Thank you for allowing me to participate in your care!  I recommend that you always bring your medications to each appointment as this makes it easy to ensure you are on the correct medications and helps [place] not miss when refills are needed.  Our plans for today:  - limit NSAIDs (Aleve) which can worsen GI bleeds - I sent prescription for baclofen (muscle relaxer). Take 1/2 tablet twice a day as needed if it helps -Continue the over the counter topical medications if they help  -I referred you to PT. They will call to schedule apt  -Return in 2-4 weeks for follow up and to check A1c  Take care and seek immediate care sooner if you develop any concerns.   Dr. Erick Alley, DO [facility]

## 2023-06-14 NOTE — Addendum Note (Signed)
Addended by: Howard Pouch on: 06/14/2023 10:26 PM   Modules accepted: Orders

## 2023-06-14 NOTE — Assessment & Plan Note (Addendum)
Mixture of pain due to chronic rotator cuff injury and OA but also has muscular pain of shoulder blades and neck likely d/t muscle strain. On chart review, there was concern in February for GI bleed causing iron deficiency anemia. He did see GI but I am unable to see their notes. Pt advised to limit NSAID use and he is already on a PPI. -Baclofen 5 mg twice daily prn -Can continue with OTC topicals -Tylenol 1000 mg q6h prn -OTC aleve prn - limit use  -PT referral placed, may benefit from dry needling

## 2023-06-27 ENCOUNTER — Ambulatory Visit: Payer: Medicare HMO | Admitting: Student

## 2023-06-27 NOTE — Progress Notes (Deleted)
    SUBJECTIVE:   CHIEF COMPLAINT / HPI:    Chronic shoulder pain, bilateral Seen on 06/14/2023 for bilateral shoulder pain, make sure pain due to a chronic rotator cuff injury, OA and muscular pain likely due to muscle strain.  He has been taking baclofen***, using Tylenol***  He was referred to PT and has an appointment scheduled on 07/05/2023.  PERTINENT  PMH / PSH: PAD, s/p AKA left, HTN, chronic shoulder pain due to OA and rotator cuff injuries  OBJECTIVE:   There were no vitals taken for this visit. ***  General: NAD, pleasant, able to participate in exam Cardiac: RRR, no murmurs. Respiratory: CTAB, normal effort, No wheezes, rales or rhonchi Abdomen: Bowel sounds present, nontender, nondistended, no hepatosplenomegaly. Extremities: no edema or cyanosis. Skin: warm and dry, no rashes noted Neuro: alert, no obvious focal deficits Psych: Normal affect and mood  ASSESSMENT/PLAN:   No problem-specific Assessment & Plan notes found for this encounter.     Dr. Erick Alley, DO [facility]    {    This will disappear when note is signed, click to select method of visit    :1}

## 2023-07-05 ENCOUNTER — Ambulatory Visit: Payer: Medicare HMO | Attending: Family Medicine | Admitting: Physical Therapy

## 2023-07-07 ENCOUNTER — Other Ambulatory Visit: Payer: Self-pay | Admitting: Student

## 2023-07-07 DIAGNOSIS — M25511 Pain in right shoulder: Secondary | ICD-10-CM

## 2023-07-13 ENCOUNTER — Ambulatory Visit: Payer: Medicare HMO | Admitting: Student

## 2023-07-14 ENCOUNTER — Ambulatory Visit (INDEPENDENT_AMBULATORY_CARE_PROVIDER_SITE_OTHER): Payer: Medicare HMO | Admitting: Podiatry

## 2023-07-14 DIAGNOSIS — Z91199 Patient's noncompliance with other medical treatment and regimen due to unspecified reason: Secondary | ICD-10-CM

## 2023-07-14 NOTE — Progress Notes (Signed)
No show

## 2023-07-17 ENCOUNTER — Other Ambulatory Visit: Payer: Self-pay | Admitting: Student

## 2023-07-17 DIAGNOSIS — R0609 Other forms of dyspnea: Secondary | ICD-10-CM

## 2023-07-21 ENCOUNTER — Ambulatory Visit: Payer: Medicare HMO | Admitting: Podiatry

## 2023-07-21 ENCOUNTER — Other Ambulatory Visit: Payer: Self-pay | Admitting: Podiatry

## 2023-07-21 DIAGNOSIS — I70209 Unspecified atherosclerosis of native arteries of extremities, unspecified extremity: Secondary | ICD-10-CM | POA: Diagnosis not present

## 2023-07-21 DIAGNOSIS — E1151 Type 2 diabetes mellitus with diabetic peripheral angiopathy without gangrene: Secondary | ICD-10-CM | POA: Diagnosis not present

## 2023-07-21 DIAGNOSIS — M79671 Pain in right foot: Secondary | ICD-10-CM | POA: Diagnosis not present

## 2023-07-21 DIAGNOSIS — I998 Other disorder of circulatory system: Secondary | ICD-10-CM | POA: Diagnosis not present

## 2023-07-21 DIAGNOSIS — I96 Gangrene, not elsewhere classified: Secondary | ICD-10-CM

## 2023-07-21 MED ORDER — OXYCODONE-ACETAMINOPHEN 5-325 MG PO TABS: 1.0000 | ORAL_TABLET | ORAL | 0 refills | Status: DC | PRN

## 2023-07-21 NOTE — Progress Notes (Signed)
Subjective:  Patient ID: [name], male    DOB: [birth year],  MRN: [id number]  Chief Complaint  Patient presents with   Foot Pain    Right foot pain. Sharp pains x 2 weeks. Patient continues to take Gabapentin TID. Patient stated his pain is worst since his 2nd toenail came off.     72 y.o. male presents with concern for severe pain in the right foot.  He is also noticed some discoloration in the foot.  He is taking gabapentin.  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   Critical lower limb ischemia (HCC) 12/19/2019   Diabetes mellitus    takes Metformin daily   GERD (gastroesophageal reflux disease)    takes Protonix daily   History of blood transfusion    "related to low HgB" ((09/10/2015   Hyperlipidemia    takes Vytorin daily   Hypertension    takes Benazepril and Bystolic daily   PAD (peripheral artery disease) (HCC)    Pneumonia    Radiculopathy, cervical region 02/11/2017   Shortness of breath dyspnea    with exertion   Tobacco user    Toe fracture, right 05/09/2011   Wears glasses     Allergies  Allergen Reactions   Glipizide Other (See Comments)    REACTION IS SIDE EFFECT Severe hypoglycemia to 40s.     ROS: Negative except as per HPI above  Objective:  General: AAO x3, NAD  Dermatological: There is noted to be bluish purplish dusky discoloration of the right foot consistent with severe arterial insufficiency and early gangrenous changes.  Severely tender with palpation of the right foot  Vascular:  Dorsalis Pedis artery and Posterior Tibial artery pedal pulses are nonpalpable on the right foot.   Neruologic: Grossly intact via light touch right  foot  Musculoskeletal: Tender palpation right foot.  Status post above-knee amputation left lower extremity  Gait: Unassisted, Nonantalgic.   No images are attached to the encounter.  Assessment:   1. Gangrene of right foot (HCC)   2. Ischemic pain of right foot   3. 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  # Gangrenous changes of the right foot with severe ischemic pain of the right foot # DM 2 with atherosclerosis of arteries prior history of left above-knee amputation -Discussed with patient that his discoloration of his right foot as well as severe pain is likely attributable to ischemic pain related to lack of blood flow to the right foot -Discussed the patient going to the emergency department for urgent evaluation of this matter versus being seen in the outpatient clinic setting -He states he would not want to go to the emergency department and therefore I will place with urgent referral to vein and vascular for gangrenous changes of the foot -Also said I will send pain medication for patient including oxycodone to help with ischemic pain   Return for after vasc eval.          Corinna Gab, DPM [facility]

## 2023-07-25 ENCOUNTER — Other Ambulatory Visit: Payer: Self-pay

## 2023-07-25 ENCOUNTER — Ambulatory Visit (INDEPENDENT_AMBULATORY_CARE_PROVIDER_SITE_OTHER)
Admission: RE | Admit: 2023-07-25 | Discharge: 2023-07-25 | Disposition: A | Discharge status: Home / Self Care | Payer: Medicare HMO | Source: Ambulatory Visit | Attending: Vascular Surgery | Admitting: Vascular Surgery

## 2023-07-25 ENCOUNTER — Ambulatory Visit (INDEPENDENT_AMBULATORY_CARE_PROVIDER_SITE_OTHER): Payer: Medicare HMO | Admitting: Physician Assistant

## 2023-07-25 ENCOUNTER — Ambulatory Visit (HOSPITAL_COMMUNITY)
Admission: RE | Admit: 2023-07-25 | Discharge: 2023-07-25 | Disposition: A | Discharge status: Home / Self Care | Payer: Medicare HMO | Source: Ambulatory Visit | Attending: Vascular Surgery | Admitting: Vascular Surgery

## 2023-07-25 VITALS — BP 106/70 | HR 85 | Temp 98.5°F | Resp 18 | Ht 67.0 in | Wt 190.0 lb

## 2023-07-25 DIAGNOSIS — I7025 Atherosclerosis of native arteries of other extremities with ulceration: Secondary | ICD-10-CM | POA: Diagnosis not present

## 2023-07-25 DIAGNOSIS — I70229 Atherosclerosis of native arteries of extremities with rest pain, unspecified extremity: Secondary | ICD-10-CM | POA: Insufficient documentation

## 2023-07-25 DIAGNOSIS — L97509 Non-pressure chronic ulcer of other part of unspecified foot with unspecified severity: Secondary | ICD-10-CM

## 2023-07-25 DIAGNOSIS — I70221 Atherosclerosis of native arteries of extremities with rest pain, right leg: Secondary | ICD-10-CM

## 2023-07-25 DIAGNOSIS — I739 Peripheral vascular disease, unspecified: Secondary | ICD-10-CM

## 2023-07-25 NOTE — Progress Notes (Signed)
Office Note     CC:  follow up Requesting Provider:  Erick Alley, DO  HPI: [name] is a 72 y.o. (November 10, 1951) male who presents for follow up of PAD and carotid stenosis. He is recently underwent left AKA in February of 2022 by Dr. Lenell Antu. He has history also of right EIA stenting in 2018 by Dr. Edilia Bo and more recently right femoral endarterectomy in April of 2021 by Dr. Chestine Spore.   He has overall done okay following his AKA. He has had issues with neuropathic pain that has limited his ability to use a prosthesis. He has not had any right lower extremity issues since his procedures. He has been without rest pain or tissue loss. However, he does not ambulate so hard to assess claudication symptoms  Today he reports significant pain in right foot. This has been going on for about 3 weeks. He says he started noticing discoloration, which then progressed to some wounds on the toes and significant pain in the foot. The pain is keeping him awake at night. He was seen by his podiatrist, Dr. Annamary Rummage on 7/26 who recommended urgent evaluation in our office with concerns of ischemia of right foot.He has known SFA occlusion with single vessel peroneal runoff based off of prior Angiogram in 2018. He does continue to have sharp pains in left AKA. He does use right leg for transerring and standing but it is limited.  He denies any visual changes, slurred speech, facial drooping, unilateral upper or lower extremity weakness or numbness. His carotids on last duplex in January were 1-39% on right and 40-59% on left.   The pt is on a statin for cholesterol management.  The pt is not on a daily aspirin.   Other AC:  Plavix The pt is on CCB, BB, ACEI for hypertension.   The pt is diabetic.  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   Critical lower limb ischemia (HCC) 12/19/2019   Diabetes mellitus    takes Metformin daily   GERD (gastroesophageal reflux disease)    takes Protonix daily   History of blood transfusion    "related to low HgB" ((09/10/2015   Hyperlipidemia    takes Vytorin daily   Hypertension    takes Benazepril and Bystolic daily   PAD (peripheral artery disease) (HCC)    Pneumonia    Radiculopathy, cervical region 02/11/2017   Shortness of breath dyspnea    with exertion   Tobacco user    Toe fracture, right 05/09/2011   Wears glasses     Past Surgical History:  Procedure Laterality Date   ABDOMINAL AORTOGRAM W/LOWER EXTREMITY N/A 12/11/2017   Procedure: ABDOMINAL AORTOGRAM W/LOWER EXTREMITY;  Surgeon: Chuck Hint, MD;  Location: [facility] INVASIVE CV LAB;  Service: Cardiovascular;  Laterality: N/A;   ABDOMINAL AORTOGRAM W/LOWER EXTREMITY Bilateral 04/22/2020   Procedure: ABDOMINAL AORTOGRAM W/LOWER EXTREMITY;  Surgeon: Cephus Shelling, MD;  Location: [facility] INVASIVE CV LAB;  Service: Cardiovascular;  Laterality: Bilateral;   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   AMPUTATION Left 02/19/2021   Procedure: LEFT ABOVE KNEE AMPUTATION;  Surgeon: Heath Lark  N, MD;  Location: [facility] OR;  Service: Vascular;  Laterality: Left;   ANTERIOR CERVICAL DECOMP/DISCECTOMY FUSION  03/08/12   C6-7   ANTERIOR CERVICAL DECOMP/DISCECTOMY FUSION  03/08/2012   Procedure: ANTERIOR CERVICAL DECOMPRESSION/DISCECTOMY FUSION 1 LEVEL/HARDWARE REMOVAL;  Surgeon: Maeola Harman, MD;  Location: [facility] NEURO ORS;  Service: Neurosurgery;  Laterality: N/A;  revison of C5-7 anterior cervical decompression with fusion with Cervical Five-Thoracic One anterior cervical decompression with fusion with interbody prothesis plating and bonegraft   BACK SURGERY  1996    BIOPSY  12/05/2020   Procedure: BIOPSY;  Surgeon: Meryl Dare, MD;  Location: [facility] ENDOSCOPY;  Service: Endoscopy;;   BYPASS GRAFT FEMORAL-PERONEAL Left 11/11/2016   Procedure: REDO LEFT FEMORAL-PERONEAL BYPASS WITH PROPATEN X 80CM GRAFT;  Surgeon: Chuck Hint, MD;  Location: [facility] OR;  Service: Vascular;  Laterality: Left;   COLONOSCOPY W/ BIOPSIES AND POLYPECTOMY  08/17/2012   f/u 5 years, 4 polyps, no high grade dysplasia or malignancy, tubular adenoma, hyperplastic polyops   COLONOSCOPY WITH PROPOFOL N/A 12/05/2020   Procedure: COLONOSCOPY WITH PROPOFOL;  Surgeon: Meryl Dare, MD;  Location: [facility] ENDOSCOPY;  Service: Endoscopy;  Laterality: N/A;   EMBOLECTOMY Left 12/19/2019   Procedure: LEFT FEMERAL- PERONEAL THROMBECTOMY;  Surgeon: Cephus Shelling, MD;  Location: [facility] OR;  Service: Vascular;  Laterality: Left;   ENDARTERECTOMY FEMORAL Right 04/24/2020   Procedure: RIGHT FEMORAL ENDARTERECTOMY;  Surgeon: Cephus Shelling, MD;  Location: [facility] OR;  Service: Vascular;  Laterality: Right;   ENTEROSCOPY N/A 12/11/2015   Procedure: ENTEROSCOPY;  Surgeon: Jeani Hawking, MD;  Location: [facility] ENDOSCOPY;  Service: Endoscopy;  Laterality: N/A;   ENTEROSCOPY N/A 03/29/2018   Procedure: ENTEROSCOPY;  Surgeon: Jeani Hawking, MD;  Location: [facility] ENDOSCOPY;  Service: Endoscopy;  Laterality: N/A;   ESOPHAGOGASTRODUODENOSCOPY  08/17/2012   normal esophagus and GEJ, diffuse gastritis with erythema- no malignancy, reactive gastropathy  with focal intestinal metaplasia   ESOPHAGOGASTRODUODENOSCOPY (EGD) WITH PROPOFOL N/A 12/05/2020   Procedure: ESOPHAGOGASTRODUODENOSCOPY (EGD) WITH PROPOFOL;  Surgeon: Meryl Dare, MD;  Location: [facility] ENDOSCOPY;  Service: Endoscopy;  Laterality: N/A;   FEMORAL-POPLITEAL BYPASS GRAFT Left 01/06/2016   Procedure: Left  COMMON FEMORAL-BELOW KNEE POPLITEAL ARTERY Bypass using non-reversed translocated saphenous vein graft from left leg;  Surgeon: Pryor Ochoa, MD;   Location: [facility] OR;  Service: Vascular;  Laterality: Left;   GIVENS CAPSULE STUDY N/A 11/24/2015   Procedure: GIVENS CAPSULE STUDY;  Surgeon: Charna Elizabeth, MD;  Location: [facility] ENDOSCOPY;  Service: Endoscopy;  Laterality: N/A;   HOT HEMOSTASIS N/A 03/29/2018   Procedure: HOT HEMOSTASIS (ARGON PLASMA COAGULATION/BICAP);  Surgeon: Jeani Hawking, MD;  Location: [facility] ENDOSCOPY;  Service: Endoscopy;  Laterality: N/A;   INGUINAL HERNIA REPAIR  1990's   right   INTRAOPERATIVE ARTERIOGRAM Left 01/06/2016   Procedure: INTRA OPERATIVE ARTERIOGRAM LEFT LOWER LEG;  Surgeon: Pryor Ochoa, MD;  Location: [facility] OR;  Service: Vascular;  Laterality: Left;   INTRAOPERATIVE ARTERIOGRAM Left 11/11/2016   Procedure: INTRA OPERATIVE ARTERIOGRAM LEFT LOWER EXTRIMITY;  Surgeon: Chuck Hint, MD;  Location: [facility] OR;  Service: Vascular;  Laterality: Left;   IR ANGIOGRAM FOLLOW UP STUDY  12/11/2017   IR GENERIC HISTORICAL  10/24/2016   IR ANGIOGRAM FOLLOW UP STUDY   LOWER EXTREMITY ANGIOGRAM Bilateral 07/30/2015   Procedure: Lower Extremity Angiogram;  Surgeon: Fransisco Hertz, MD;  Location: [facility] INVASIVE CV LAB;  Service: Cardiovascular;  Laterality: Bilateral;   LUMBAR DISC SURGERY  1996   "lower"   PATCH  ANGIOPLASTY Left 12/19/2019   Procedure: LEFT FEMORAL -PERONEAL PATCH ANGIOPLASTY WITH XENOSURE BIOLOGIC PATCH  ;  Surgeon: Cephus Shelling, MD;  Location: [facility] OR;  Service: Vascular;  Laterality: Left;   PATCH ANGIOPLASTY Right 04/24/2020   Procedure: Patch Angioplasty of Right Femoral Artery using Long Xenosure Biologic Patch 1cm x 14 cm;  Surgeon: Cephus Shelling, MD;  Location: [facility] OR;  Service: Vascular;  Laterality: Right;   PERIPHERAL VASCULAR CATHETERIZATION N/A 07/30/2015   Procedure: Abdominal Aortogram;  Surgeon: Fransisco Hertz, MD;  Location: [facility] INVASIVE CV LAB;  Service: Cardiovascular;  Laterality: N/A;   PERIPHERAL VASCULAR CATHETERIZATION N/A 10/24/2016   Procedure: Abdominal Aortogram;  Surgeon: Chuck Hint, MD;  Location: [facility] INVASIVE CV LAB;  Service: Cardiovascular;  Laterality: N/A;   PERIPHERAL VASCULAR CATHETERIZATION N/A 10/24/2016   Procedure: Lower Extremity Angiography;  Surgeon: Chuck Hint, MD;  Location: [facility] INVASIVE CV LAB;  Service: Cardiovascular;  Laterality: N/A;   PERIPHERAL VASCULAR INTERVENTION Right 12/11/2017   Procedure: PERIPHERAL VASCULAR INTERVENTION;  Surgeon: Chuck Hint, MD;  Location: [facility] INVASIVE CV LAB;  Service: Cardiovascular;  Laterality: Right;   VASCULAR SURGERY  ~ 2007   Stent SFA    VEIN HARVEST Left 01/06/2016   Procedure: LEFT GREATER SAPPHENOUS VEIN HARVEST;  Surgeon: Pryor Ochoa, MD;  Location: [facility] OR;  Service: Vascular;  Laterality: Left;    Social History   Socioeconomic History   Marital status: Widowed    Spouse name: Not on file   Number of children: 3   Years of education: 14   Highest education level: Some college, no degree  Occupational History   Occupation: Disable  Tobacco Use   Smoking status: Some Days    Types: Cigars    Passive exposure: Current   Smokeless tobacco: Never  Vaping Use   Vaping status: Never Used  Substance and Sexual Activity   Alcohol use: Yes    Alcohol/week: 2.0 standard drinks of alcohol    Types: 2 Cans of beer per week   Drug use: No   Sexual activity: Yes  Other Topics Concern   Not on file  Social History Narrative   Lives alone. Lives in an apartment. Ground floor apartment. Smoke alarms present, no grab bars.    Has a shih-tzu, named Missy.    Patient eats a variety of foods. Drinks tea, Kool-aid, coffee      Use public transportation or brother will take places.   Some food insecurity.      Social Determinants of Health   Financial Resource Strain: Low Risk  (02/10/2023)   Overall Financial Resource Strain (CARDIA)    Difficulty of Paying Living Expenses: Not hard at all  Food Insecurity: No Food Insecurity (02/10/2023)   Hunger Vital Sign    Worried About  Running Out of Food in the Last Year: Never true    Ran Out of Food in the Last Year: Never true  Transportation Needs: Unmet Transportation Needs (02/10/2023)   PRAPARE - Administrator, Civil Service (Medical): Yes    Lack of Transportation (Non-Medical): No  Physical Activity: Insufficiently Active (02/10/2023)   Exercise Vital Sign    Days of Exercise per Week: 7 days    Minutes of Exercise per Session: 10 min  Stress: No Stress Concern Present (02/10/2023)   [employer] of Occupational Health - Occupational Stress Questionnaire    Feeling of Stress : Not at all  Social Connections: Moderately Isolated (  02/10/2023)   Social Connection and Isolation Panel [NHANES]    Frequency of Communication with Friends and Family: More than three times a week    Frequency of Social Gatherings with Friends and Family: Never    Attends Religious Services: Never    Database administrator or Organizations: No    Attends Banker Meetings: Never    Marital Status: Living with partner  Intimate Partner Violence: Not At Risk (02/10/2023)   Humiliation, Afraid, Rape, and Kick questionnaire    Fear of Current or Ex-Partner: No    Emotionally Abused: No    Physically Abused: No    Sexually Abused: No    Family History  Problem Relation Age of Onset   Cancer Mother        colon Cancer   Hyperlipidemia Mother    Diabetes Mother    Heart disease Father    Hypertension Father    Cancer Sister        Uterine   Diabetes Sister    Diabetes Brother     Current Outpatient Medications  Medication Sig Dispense Refill   albuterol (VENTOLIN HFA) 108 (90 Base) MCG/ACT inhaler TAKE 2 PUFFS BY MOUTH EVERY 6 HOURS AS NEEDED FOR WHEEZE OR SHORTNESS OF BREATH 8.5 each 1   amLODipine (NORVASC) 5 MG tablet Take 1 tablet (5 mg total) by mouth at bedtime. 90 tablet 3   amoxicillin-clavulanate (AUGMENTIN) 875-125 MG tablet Take 1 tablet by mouth 2 (two) times daily. 20 tablet 0    atorvastatin (LIPITOR) 40 MG tablet TAKE 1 TABLET BY MOUTH EVERY DAY 90 tablet 1   baclofen (LIORESAL) 10 MG tablet Take 0.5 tablets (5 mg total) by mouth 2 (two) times daily as needed for muscle spasms. Make appointment before next refill. 30 tablet 0   benazepril (LOTENSIN) 5 MG tablet TAKE 1 TABLET BY MOUTH EVERY DAY 90 tablet 3   clopidogrel (PLAVIX) 75 MG tablet TAKE 1 TABLET BY MOUTH EVERY DAY 90 tablet 0   diclofenac Sodium (VOLTAREN) 1 % GEL Apply 2 g topically 4 (four) times daily. 50 g 1   ezetimibe (ZETIA) 10 MG tablet TAKE 1 TABLET BY MOUTH EVERY DAY 90 tablet 1   ferrous sulfate 325 (65 FE) MG EC tablet Take 1 tablet (325 mg total) by mouth daily with breakfast. (Patient not taking: Reported on 02/10/2023) 90 tablet 1   FLUoxetine (PROZAC) 10 MG capsule TAKE 1 CAPSULE BY MOUTH EVERY DAY 90 capsule 3   gabapentin (NEURONTIN) 100 MG capsule Take 1 capsule (100 mg total) by mouth 3 (three) times daily. 90 capsule 3   gabapentin (NEURONTIN) 300 MG capsule TAKE 1 CAPSULE BY MOUTH THREE TIMES A DAY AS NEEDED 90 capsule 1   metFORMIN (GLUCOPHAGE) 1000 MG tablet TAKE 1 TABLET BY MOUTH TWICE A DAY WITH FOOD 180 tablet 1   Nebivolol HCl 20 MG TABS TAKE 1 TABLET BY MOUTH EVERY DAY 90 tablet 3   nicotine polacrilex (NICORETTE) 2 MG gum Take 1 each (2 mg total) by mouth as needed for smoking cessation. 100 tablet 3   oxyCODONE-acetaminophen (PERCOCET) 5-325 MG tablet Take 1 tablet by mouth every 4 (four) hours as needed for severe pain. 30 tablet 0   pantoprazole (PROTONIX) 40 MG tablet TAKE 1 TABLET BY MOUTH EVERY DAY AS NEEDED 90 tablet 1   polyethylene glycol powder (GLYCOLAX/MIRALAX) 17 GM/SCOOP powder Take 17 g by mouth daily. 500 g 0   No current facility-administered medications for this  visit.    Allergies  Allergen Reactions   Glipizide Other (See Comments)    REACTION IS SIDE EFFECT Severe hypoglycemia to 40s.      REVIEW OF SYSTEMS:  [X]  denotes positive finding, [ ]  denotes  negative finding Cardiac  Comments:  Chest pain or chest pressure:    Shortness of breath upon exertion:    Short of breath when lying flat:    Irregular heart rhythm:        Vascular    Pain in calf, thigh, or hip brought on by ambulation:    Pain in feet at night that wakes you up from your sleep:     Blood clot in your veins:    Leg swelling:         Pulmonary    Oxygen at home:    Productive cough:     Wheezing:         Neurologic    Sudden weakness in arms or legs:     Sudden numbness in arms or legs:     Sudden onset of difficulty speaking or slurred speech:    Temporary loss of vision in one eye:     Problems with dizziness:         Gastrointestinal    Blood in stool:     Vomited blood:         Genitourinary    Burning when urinating:     Blood in urine:        Psychiatric    Major depression:         Hematologic    Bleeding problems:    Problems with blood clotting too easily:        Skin    Rashes or ulcers:        Constitutional    Fever or chills:      PHYSICAL EXAMINATION:  Vitals:   07/25/23 0940  BP: 106/70  Pulse: 85  Resp: 18  Temp: 98.5 F (36.9 C)  TempSrc: Temporal  SpO2: 92%  Weight: 190 lb (86.2 kg)  Height: 5\' 7"  (1.702 m)    General:  WDWN in NAD; vital signs documented above Gait: Not observed, in wheel chair HENT: WNL, normocephalic Pulmonary: normal non-labored breathing without  wheezing Cardiac: regular HR Abdomen: soft, NT Vascular Exam/Pulses: 1+ femoral pulses bilaterally, Faint monophasic right AT and PT signals Extremities: with ischemic changes to right foot, without Gangrene , without cellulitis; with open wound on dorsum of 3nd toe, and subungual ulcer of 2nd toe. Motor and sensation intact  Musculoskeletal: no muscle wasting or atrophy  Neurologic: A&O X 3 Psychiatric:  The pt has Normal affect.   Non-Invasive Vascular Imaging:   ABI Findings:  +---------+------------------+-----+----------+--------+   Right   Rt Pressure (mmHg)IndexWaveform  Comment   +---------+------------------+-----+----------+--------+  Brachial 85                                         +---------+------------------+-----+----------+--------+  PTA     48                0.56 monophasic          +---------+------------------+-----+----------+--------+  DP      43                0.51 monophasic          +---------+------------------+-----+----------+--------+  Great Toe  Absent              +---------+------------------+-----+----------+--------+   +--------+------------------+-----+--------+-------+  Left   Lt Pressure (mmHg)IndexWaveformComment  +--------+------------------+-----+--------+-------+  Brachial71                                     +--------+------------------+-----+--------+-------+   Right ABIs appear decreased compared to prior study on 01/24/2023.   VAS US Aorta/Iliac/IVC duplex: Right Stent(s):  +---------------+--------+---------------+----------+--------+  EIA           PSV cm/sStenosis       Waveform  Comments  +---------------+--------+---------------+----------+--------+  Prox to Stent  338     50-99% stenosismonophasic          +---------------+--------+---------------+----------+--------+  Proximal Stent 129                    monophasic          +---------------+--------+---------------+----------+--------+  Mid Stent      72                     monophasic          +---------------+--------+---------------+----------+--------+  Distal Stent   103                    monophasic          +---------------+--------+---------------+----------+--------+  Distal to Stent76                     monophasic          +---------------+--------+---------------+----------+--------+   Summary:  Stenosis: +--------------------+-------------+--------------+  Location            Stenosis     Stent            +--------------------+-------------+--------------+  Right External Iliac>50% stenosis1-49% stenosis  +--------------------+-------------+--------------+    ASSESSMENT/PLAN:: 72 y.o. male here for follow up for PAD. He presents with 3 week history of ischemic rest pain and tissue loss. He was seen by Podiatrist who recommended urgent follow up with [place]. Duplex today shows increased velocities in the proximal right EIA stent suggesting > 50% with PSV of 338 cm/s - ABI also decreased from prior study 6 months ago - Has known SFA occlusion with signal vessel peroneal runoff - He knows he is at high risk of needing more proximal amputation - Continue Statin and Plavix - The patient is on best medical therapy for peripheral arterial disease. The patient has been counseled about the risks of tobacco use in atherosclerotic disease. The patient has been counseled to abstain from any tobacco use - Recommend Angiography of RLE but he understands that this may be diagnostic in nature and he may require further surgical intervention - An aortogram with bilateral lower extremity runoff angiography and Right lower extremity intervention and is indicated to better evaluate the patient's lower extremity circulation because of the limb threatening nature of the patient's diagnosis. Based on the patient's clinical exam and non-invasive data, we anticipate an endovascular intervention in the iliac and femoropopliteal vessels. Stenting and/or athrectomy would be favored because of the improved primary patency of these interventions as compared to plain balloon angioplasty - Will arrange Angiogram, Arteriogram with Dr. Chestine Spore on 07/27/23    Graceann Congress, PA-C [facility] [phone]  Clinic MD:   Steve Rattler

## 2023-07-27 ENCOUNTER — Other Ambulatory Visit: Payer: Self-pay

## 2023-07-27 ENCOUNTER — Ambulatory Visit (HOSPITAL_COMMUNITY)
Admission: RE | Admit: 2023-07-27 | Discharge: 2023-07-27 | Disposition: A | Discharge status: Home / Self Care | Payer: Medicare HMO | Attending: Vascular Surgery | Admitting: Vascular Surgery

## 2023-07-27 ENCOUNTER — Ambulatory Visit (HOSPITAL_COMMUNITY)
Admission: RE | Disposition: A | Discharge status: Home / Self Care | Payer: Self-pay | Source: Home / Self Care | Attending: Vascular Surgery

## 2023-07-27 ENCOUNTER — Ambulatory Visit (HOSPITAL_BASED_OUTPATIENT_CLINIC_OR_DEPARTMENT_OTHER): Payer: Medicare HMO

## 2023-07-27 DIAGNOSIS — F17290 Nicotine dependence, other tobacco product, uncomplicated: Secondary | ICD-10-CM | POA: Insufficient documentation

## 2023-07-27 DIAGNOSIS — E785 Hyperlipidemia, unspecified: Secondary | ICD-10-CM | POA: Insufficient documentation

## 2023-07-27 DIAGNOSIS — E1151 Type 2 diabetes mellitus with diabetic peripheral angiopathy without gangrene: Secondary | ICD-10-CM | POA: Diagnosis not present

## 2023-07-27 DIAGNOSIS — Z89612 Acquired absence of left leg above knee: Secondary | ICD-10-CM | POA: Insufficient documentation

## 2023-07-27 DIAGNOSIS — I10 Essential (primary) hypertension: Secondary | ICD-10-CM | POA: Insufficient documentation

## 2023-07-27 DIAGNOSIS — E11621 Type 2 diabetes mellitus with foot ulcer: Secondary | ICD-10-CM | POA: Diagnosis not present

## 2023-07-27 DIAGNOSIS — I7090 Unspecified atherosclerosis: Secondary | ICD-10-CM | POA: Diagnosis not present

## 2023-07-27 DIAGNOSIS — I70235 Atherosclerosis of native arteries of right leg with ulceration of other part of foot: Secondary | ICD-10-CM | POA: Diagnosis not present

## 2023-07-27 DIAGNOSIS — I7025 Atherosclerosis of native arteries of other extremities with ulceration: Secondary | ICD-10-CM

## 2023-07-27 DIAGNOSIS — Z7984 Long term (current) use of oral hypoglycemic drugs: Secondary | ICD-10-CM | POA: Diagnosis not present

## 2023-07-27 DIAGNOSIS — Z79899 Other long term (current) drug therapy: Secondary | ICD-10-CM | POA: Diagnosis not present

## 2023-07-27 DIAGNOSIS — E1140 Type 2 diabetes mellitus with diabetic neuropathy, unspecified: Secondary | ICD-10-CM | POA: Insufficient documentation

## 2023-07-27 DIAGNOSIS — I70229 Atherosclerosis of native arteries of extremities with rest pain, unspecified extremity: Secondary | ICD-10-CM

## 2023-07-27 DIAGNOSIS — I6521 Occlusion and stenosis of right carotid artery: Secondary | ICD-10-CM | POA: Insufficient documentation

## 2023-07-27 DIAGNOSIS — Z7902 Long term (current) use of antithrombotics/antiplatelets: Secondary | ICD-10-CM | POA: Diagnosis not present

## 2023-07-27 DIAGNOSIS — L97519 Non-pressure chronic ulcer of other part of right foot with unspecified severity: Secondary | ICD-10-CM | POA: Diagnosis not present

## 2023-07-27 HISTORY — PX: PERIPHERAL VASCULAR INTERVENTION: CATH118257

## 2023-07-27 HISTORY — PX: ABDOMINAL AORTOGRAM W/LOWER EXTREMITY: CATH118223

## 2023-07-27 LAB — POCT I-STAT, CHEM 8
BUN: 10 mg/dL (ref 8–23)
Calcium, Ion: 1.13 mmol/L — ABNORMAL LOW (ref 1.15–1.40)
Chloride: 98 mmol/L (ref 98–111)
Creatinine, Ser: 0.8 mg/dL (ref 0.61–1.24)
Glucose, Bld: 124 mg/dL — ABNORMAL HIGH (ref 70–99)
HCT: 31 % — ABNORMAL LOW (ref 39.0–52.0)
Hemoglobin: 10.5 g/dL — ABNORMAL LOW (ref 13.0–17.0)
Potassium: 4.5 mmol/L (ref 3.5–5.1)
Sodium: 137 mmol/L (ref 135–145)
TCO2: 28 mmol/L (ref 22–32)

## 2023-07-27 SURGERY — ABDOMINAL AORTOGRAM W/LOWER EXTREMITY
Anesthesia: LOCAL | Laterality: Right

## 2023-07-27 MED ORDER — ACETAMINOPHEN 325 MG PO TABS
650.0000 mg | ORAL_TABLET | ORAL | Status: DC | PRN
  Administered 2023-07-27: 650 mg via ORAL
  Filled 2023-07-27: qty 2

## 2023-07-27 MED ORDER — HYDRALAZINE HCL 20 MG/ML IJ SOLN: 5.0000 mg | INTRAMUSCULAR | Status: DC | PRN

## 2023-07-27 MED ORDER — ASPIRIN 81 MG PO TBEC
81.0000 mg | DELAYED_RELEASE_TABLET | Freq: Every day | ORAL | Status: DC
Start: 2023-07-27 — End: 2023-11-13

## 2023-07-27 MED ORDER — ASPIRIN 81 MG PO TBEC: 81.0000 mg | DELAYED_RELEASE_TABLET | Freq: Every day | ORAL | Status: DC

## 2023-07-27 MED ORDER — MIDAZOLAM HCL 2 MG/2ML IJ SOLN
INTRAMUSCULAR | Status: AC
  Filled 2023-07-27: qty 2

## 2023-07-27 MED ORDER — HEPARIN SODIUM (PORCINE) 1000 UNIT/ML IJ SOLN
INTRAMUSCULAR | Status: AC
  Filled 2023-07-27: qty 10

## 2023-07-27 MED ORDER — CLOPIDOGREL BISULFATE 75 MG PO TABS
ORAL_TABLET | ORAL | Status: AC
  Filled 2023-07-27: qty 1

## 2023-07-27 MED ORDER — SODIUM CHLORIDE 0.9 % IV SOLN: INTRAVENOUS | Status: DC

## 2023-07-27 MED ORDER — ASPIRIN 81 MG PO CHEW
CHEWABLE_TABLET | ORAL | Status: AC
  Filled 2023-07-27: qty 1

## 2023-07-27 MED ORDER — HEPARIN (PORCINE) IN NACL 1000-0.9 UT/500ML-% IV SOLN
INTRAVENOUS | Status: DC | PRN
  Administered 2023-07-27 (×2): 500 mL

## 2023-07-27 MED ORDER — CLOPIDOGREL BISULFATE 75 MG PO TABS: 75.0000 mg | ORAL_TABLET | Freq: Every day | ORAL | Status: DC

## 2023-07-27 MED ORDER — IODIXANOL 320 MG/ML IV SOLN
INTRAVENOUS | Status: DC | PRN
  Administered 2023-07-27: 85 mL

## 2023-07-27 MED ORDER — FENTANYL CITRATE (PF) 100 MCG/2ML IJ SOLN
INTRAMUSCULAR | Status: AC
  Filled 2023-07-27: qty 2

## 2023-07-27 MED ORDER — MIDAZOLAM HCL 2 MG/2ML IJ SOLN
INTRAMUSCULAR | Status: DC | PRN
  Administered 2023-07-27: 1 mg via INTRAVENOUS

## 2023-07-27 MED ORDER — SODIUM CHLORIDE 0.9 % IV SOLN: 250.0000 mL | INTRAVENOUS | Status: DC | PRN

## 2023-07-27 MED ORDER — LIDOCAINE HCL (PF) 1 % IJ SOLN
INTRAMUSCULAR | Status: AC
  Filled 2023-07-27: qty 30

## 2023-07-27 MED ORDER — FENTANYL CITRATE (PF) 100 MCG/2ML IJ SOLN
INTRAMUSCULAR | Status: DC | PRN
  Administered 2023-07-27: 25 ug via INTRAVENOUS

## 2023-07-27 MED ORDER — SODIUM CHLORIDE 0.9% FLUSH: 3.0000 mL | INTRAVENOUS | Status: DC | PRN

## 2023-07-27 MED ORDER — LIDOCAINE HCL (PF) 1 % IJ SOLN
INTRAMUSCULAR | Status: DC | PRN
  Administered 2023-07-27: 15 mL

## 2023-07-27 MED ORDER — ONDANSETRON HCL 4 MG/2ML IJ SOLN: 4.0000 mg | Freq: Four times a day (QID) | INTRAMUSCULAR | Status: DC | PRN

## 2023-07-27 MED ORDER — LABETALOL HCL 5 MG/ML IV SOLN: 10.0000 mg | INTRAVENOUS | Status: DC | PRN

## 2023-07-27 MED ORDER — SODIUM CHLORIDE 0.9% FLUSH: 3.0000 mL | Freq: Two times a day (BID) | INTRAVENOUS | Status: DC

## 2023-07-27 MED ORDER — CLOPIDOGREL BISULFATE 75 MG PO TABS
ORAL_TABLET | ORAL | Status: DC | PRN
  Administered 2023-07-27: 75 mg via ORAL

## 2023-07-27 MED ORDER — ASPIRIN 81 MG PO CHEW
CHEWABLE_TABLET | ORAL | Status: DC | PRN
  Administered 2023-07-27: 81 mg via ORAL

## 2023-07-27 SURGICAL SUPPLY — 19 items
CATH CROSS OVER TEMPO 5F (CATHETERS) IMPLANT
CATH OMNI FLUSH 5F 65CM (CATHETERS) IMPLANT
CATH STRAIGHT 5FR 65CM (CATHETERS) IMPLANT
DEVICE CLOSURE MYNXGRIP 6/7F (Vascular Products) IMPLANT
GLIDEWIRE ADV .035X260CM (WIRE) IMPLANT
GUIDEWIRE ANGLED .035X150CM (WIRE) IMPLANT
KIT ENCORE 26 ADVANTAGE (KITS) IMPLANT
KIT MICROPUNCTURE NIT STIFF (SHEATH) IMPLANT
PROTECTION STATION PRESSURIZED (MISCELLANEOUS) ×2
SET ATX-X65L (MISCELLANEOUS) IMPLANT
SHEATH BRITE TIP 7FR 35CM (SHEATH) IMPLANT
SHEATH PINNACLE 5F 10CM (SHEATH) IMPLANT
SHEATH PINNACLE 7F 10CM (SHEATH) IMPLANT
STATION PROTECTION PRESSURIZED (MISCELLANEOUS) IMPLANT
STENT VIABAHN 39XCATH 80 (Permanent Stent) IMPLANT
STENT VIABAHN 7X29 6FR 80 (Permanent Stent) IMPLANT
STENT VIABAHN 8X39 7FR 80 (Permanent Stent) ×2 IMPLANT
TRAY PV CATH (CUSTOM PROCEDURE TRAY) ×3 IMPLANT
WIRE STARTER BENTSON 035X150 (WIRE) IMPLANT

## 2023-07-27 NOTE — Discharge Instructions (Signed)
Femoral Site Care This sheet gives you information about how to care for yourself after your procedure. Your health care provider may also give you more specific instructions. If you have problems or questions, contact your health care provider. What can I expect after the procedure? After the procedure, it is common to have: Bruising that usually fades within 1-2 weeks. Tenderness at the site. Follow these instructions at home: Wound care May remove bandage after 24 hours. Do not take baths, swim, or use a hot tub for 5 days. You may shower 24-48 hours after the procedure. Gently wash the site with plain soap and water. Pat the area dry with a clean towel. Do not rub the site. This may cause bleeding. Do not apply powder or lotion to the site. Keep the site clean and dry. Check your femoral site every day for signs of infection. Check for: Redness, swelling, or pain. Fluid or blood. Warmth. Pus or a bad smell. Activity For the first 2-3 days after your procedure, or as long as directed: Avoid climbing stairs as much as possible. Do not squat. Do not lift, push or pull anything that is heavier than 10 lb for 5 days. Rest as directed. Avoid sitting for a long time without moving. Get up to take short walks every 1-2 hours. Do not drive for 24 hours. General instructions Take over-the-counter and prescription medicines only as told by your health care provider. Keep all follow-up visits as told by your health care provider. This is important. DRINK PLENTY OF FLUIDS FOR THE NEXT 2-3 DAYS. Contact a health care provider if you have: A fever or chills. You have redness, swelling, or pain around your insertion site. Get help right away if: The catheter insertion area swells very fast. You pass out. You suddenly start to sweat or your skin gets clammy. The catheter insertion area is bleeding, and the bleeding does not stop when you hold steady pressure on the area. The area near or  just beyond the catheter insertion site becomes pale, cool, tingly, or numb. These symptoms may represent a serious problem that is an emergency. Do not wait to see if the symptoms will go away. Get medical help right away. Call your local emergency services (911 in the [place] Do not drive yourself to the hospital. Summary After the procedure, it is common to have bruising that usually fades within 1-2 weeks. Check your femoral site every day for signs of infection. Do not lift, push or pull anything that is heavier than 10 lb for 5 days.  This information is not intended to replace advice given to you by your health care provider. Make sure you discuss any questions you have with your health care provider. Document Revised: 12/25/2017 Document Reviewed: 12/25/2017 Elsevier Patient Education  2020 [employer]  

## 2023-07-27 NOTE — Progress Notes (Signed)
Right lower extremity vein mapping study completed.   Please see CV Procedures for preliminary results.  Adian Jablonowski, RVT  3:57 PM 07/27/23

## 2023-07-27 NOTE — H&P (Signed)
History and Physical Interval Note:  07/27/2023 11:59 AM  [name]  has presented today for surgery, with the diagnosis of atherosclerosis pvd with rest pain - ulceration right foot.  The various methods of treatment have been discussed with the patient and family. After consideration of risks, benefits and other options for treatment, the patient has consented to  Procedure(s): ABDOMINAL AORTOGRAM W/LOWER EXTREMITY (N/A) as a surgical intervention.  The patient's history has been reviewed, patient examined, no change in status, stable for surgery.  I have reviewed the patient's chart and labs.  Questions were answered to the patient's satisfaction.    Right foot tissue loss.  Known right SFA occlusion with only peroneal runoff from 2020.  High risk for limb loss.  Cephus Shelling   Office Note        CC:  follow up Requesting Provider:  Erick Alley, DO   HPI: [name] is a 72 y.o. (19-Sep-1951) male who presents for follow up of PAD and carotid stenosis. He is recently underwent left AKA in February of 2022 by Dr. Lenell Antu. He has history also of right EIA stenting in 2018 by Dr. Edilia Bo and more recently right femoral endarterectomy in April of 2021 by Dr. Chestine Spore.    He has overall done okay following his AKA. He has had issues with neuropathic pain that has limited his ability to use a prosthesis. He has not had any right lower extremity issues since his procedures. He has been without rest pain or tissue loss. However, he does not ambulate so hard to assess claudication symptoms   Today he reports significant pain in right foot. This has been going on for about 3 weeks. He says he started noticing discoloration, which then progressed to some wounds on the toes and significant pain in the foot. The pain is keeping him awake at night. He was seen by his podiatrist, Dr. Annamary Rummage on 7/26 who recommended urgent evaluation in our office with concerns of ischemia of right foot.He has  known SFA occlusion with single vessel peroneal runoff based off of prior Angiogram in 2018. He does continue to have sharp pains in left AKA. He does use right leg for transerring and standing but it is limited.   He denies any visual changes, slurred speech, facial drooping, unilateral upper or lower extremity weakness or numbness. His carotids on last duplex in January were 1-39% on right and 40-59% on left.    The pt is on a statin for cholesterol management.  The pt is not on a daily aspirin.   Other AC:  Plavix The pt is on CCB, BB, ACEI for hypertension.   The pt is diabetic.  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   Critical lower limb ischemia (HCC) 12/19/2019   Diabetes mellitus      takes Metformin daily   GERD (gastroesophageal reflux disease)      takes Protonix daily   History of blood transfusion      "related to low HgB" ((09/10/2015   Hyperlipidemia  takes Vytorin daily   Hypertension      takes Benazepril and Bystolic daily   PAD (peripheral artery disease) (HCC)     Pneumonia     Radiculopathy, cervical region 02/11/2017   Shortness of breath dyspnea      with exertion   Tobacco user     Toe fracture, right 05/09/2011   Wears glasses                 Past Surgical History:  Procedure Laterality Date   ABDOMINAL AORTOGRAM W/LOWER EXTREMITY N/A 12/11/2017    Procedure: ABDOMINAL AORTOGRAM W/LOWER EXTREMITY;  Surgeon: Chuck Hint, MD;  Location: [facility] INVASIVE CV LAB;  Service: Cardiovascular;  Laterality: N/A;   ABDOMINAL AORTOGRAM W/LOWER EXTREMITY Bilateral 04/22/2020    Procedure: ABDOMINAL  AORTOGRAM W/LOWER EXTREMITY;  Surgeon: Cephus Shelling, MD;  Location: [facility] INVASIVE CV LAB;  Service: Cardiovascular;  Laterality: Bilateral;   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   AMPUTATION Left 02/19/2021    Procedure: LEFT ABOVE KNEE AMPUTATION;  Surgeon: Leonie Douglas, MD;  Location: [facility] OR;  Service: Vascular;  Laterality: Left;   ANTERIOR CERVICAL DECOMP/DISCECTOMY FUSION   03/08/12    C6-7   ANTERIOR CERVICAL DECOMP/DISCECTOMY FUSION   03/08/2012    Procedure: ANTERIOR CERVICAL DECOMPRESSION/DISCECTOMY FUSION 1 LEVEL/HARDWARE REMOVAL;  Surgeon: Maeola Harman, MD;  Location: [facility] NEURO ORS;  Service: Neurosurgery;  Laterality: N/A;  revison of C5-7 anterior cervical decompression with fusion with Cervical Five-Thoracic One anterior cervical decompression with fusion with interbody prothesis plating and bonegraft   BACK SURGERY   1996   BIOPSY   12/05/2020    Procedure: BIOPSY;  Surgeon: Meryl Dare, MD;  Location: [facility] ENDOSCOPY;  Service: Endoscopy;;   BYPASS GRAFT FEMORAL-PERONEAL Left 11/11/2016    Procedure: REDO LEFT FEMORAL-PERONEAL BYPASS WITH PROPATEN X 80CM GRAFT;  Surgeon: Chuck Hint, MD;  Location: [facility] OR;  Service: Vascular;  Laterality: Left;   COLONOSCOPY W/ BIOPSIES AND POLYPECTOMY   08/17/2012    f/u 5 years, 4 polyps, no high grade dysplasia or malignancy, tubular adenoma, hyperplastic polyops   COLONOSCOPY WITH PROPOFOL N/A 12/05/2020    Procedure: COLONOSCOPY WITH PROPOFOL;  Surgeon: Meryl Dare, MD;  Location: [facility] ENDOSCOPY;  Service: Endoscopy;  Laterality: N/A;   EMBOLECTOMY Left 12/19/2019    Procedure: LEFT FEMERAL- PERONEAL THROMBECTOMY;  Surgeon: Cephus Shelling, MD;  Location: [facility] OR;  Service: Vascular;  Laterality: Left;   ENDARTERECTOMY FEMORAL Right 04/24/2020    Procedure: RIGHT FEMORAL ENDARTERECTOMY;  Surgeon: Cephus Shelling, MD;  Location: [facility] OR;  Service: Vascular;  Laterality: Right;   ENTEROSCOPY N/A 12/11/2015     Procedure: ENTEROSCOPY;  Surgeon: Jeani Hawking, MD;  Location: [facility] ENDOSCOPY;  Service: Endoscopy;  Laterality: N/A;   ENTEROSCOPY N/A 03/29/2018    Procedure: ENTEROSCOPY;  Surgeon: Jeani Hawking, MD;  Location: [facility] ENDOSCOPY;  Service: Endoscopy;  Laterality: N/A;   ESOPHAGOGASTRODUODENOSCOPY   08/17/2012    normal esophagus and GEJ, diffuse gastritis with erythema- no malignancy, reactive gastropathy  with focal intestinal metaplasia   ESOPHAGOGASTRODUODENOSCOPY (EGD) WITH PROPOFOL N/A 12/05/2020    Procedure: ESOPHAGOGASTRODUODENOSCOPY (EGD) WITH PROPOFOL;  Surgeon: Meryl Dare, MD;  Location: [facility] ENDOSCOPY;  Service: Endoscopy;  Laterality: N/A;   FEMORAL-POPLITEAL BYPASS GRAFT Left 01/06/2016    Procedure: Left  COMMON FEMORAL-BELOW KNEE POPLITEAL ARTERY Bypass using non-reversed translocated saphenous vein graft from left leg;  Surgeon: Pryor Ochoa, MD;  Location: [facility] OR;  Service:  Vascular;  Laterality: Left;   GIVENS CAPSULE STUDY N/A 11/24/2015    Procedure: GIVENS CAPSULE STUDY;  Surgeon: Charna Elizabeth, MD;  Location: [facility] ENDOSCOPY;  Service: Endoscopy;  Laterality: N/A;   HOT HEMOSTASIS N/A 03/29/2018    Procedure: HOT HEMOSTASIS (ARGON PLASMA COAGULATION/BICAP);  Surgeon: Jeani Hawking, MD;  Location: [facility] ENDOSCOPY;  Service: Endoscopy;  Laterality: N/A;   INGUINAL HERNIA REPAIR   1990's    right   INTRAOPERATIVE ARTERIOGRAM Left 01/06/2016    Procedure: INTRA OPERATIVE ARTERIOGRAM LEFT LOWER LEG;  Surgeon: Pryor Ochoa, MD;  Location: [facility] OR;  Service: Vascular;  Laterality: Left;   INTRAOPERATIVE ARTERIOGRAM Left 11/11/2016    Procedure: INTRA OPERATIVE ARTERIOGRAM LEFT LOWER EXTRIMITY;  Surgeon: Chuck Hint, MD;  Location: [facility] OR;  Service: Vascular;  Laterality: Left;   IR ANGIOGRAM FOLLOW UP STUDY   12/11/2017   IR GENERIC HISTORICAL   10/24/2016    IR ANGIOGRAM FOLLOW UP STUDY   LOWER EXTREMITY ANGIOGRAM Bilateral 07/30/2015    Procedure: Lower Extremity Angiogram;   Surgeon: Fransisco Hertz, MD;  Location: [facility] INVASIVE CV LAB;  Service: Cardiovascular;  Laterality: Bilateral;   LUMBAR DISC SURGERY   1996    "lower"   PATCH ANGIOPLASTY Left 12/19/2019    Procedure: LEFT FEMORAL -PERONEAL PATCH ANGIOPLASTY WITH XENOSURE BIOLOGIC PATCH  ;  Surgeon: Cephus Shelling, MD;  Location: [facility] OR;  Service: Vascular;  Laterality: Left;   PATCH ANGIOPLASTY Right 04/24/2020    Procedure: Patch Angioplasty of Right Femoral Artery using Long Xenosure Biologic Patch 1cm x 14 cm;  Surgeon: Cephus Shelling, MD;  Location: [facility] OR;  Service: Vascular;  Laterality: Right;   PERIPHERAL VASCULAR CATHETERIZATION N/A 07/30/2015    Procedure: Abdominal Aortogram;  Surgeon: Fransisco Hertz, MD;  Location: [facility] INVASIVE CV LAB;  Service: Cardiovascular;  Laterality: N/A;   PERIPHERAL VASCULAR CATHETERIZATION N/A 10/24/2016    Procedure: Abdominal Aortogram;  Surgeon: Chuck Hint, MD;  Location: [facility] INVASIVE CV LAB;  Service: Cardiovascular;  Laterality: N/A;   PERIPHERAL VASCULAR CATHETERIZATION N/A 10/24/2016    Procedure: Lower Extremity Angiography;  Surgeon: Chuck Hint, MD;  Location: [facility] INVASIVE CV LAB;  Service: Cardiovascular;  Laterality: N/A;   PERIPHERAL VASCULAR INTERVENTION Right 12/11/2017    Procedure: PERIPHERAL VASCULAR INTERVENTION;  Surgeon: Chuck Hint, MD;  Location: [facility] INVASIVE CV LAB;  Service: Cardiovascular;  Laterality: Right;   VASCULAR SURGERY   ~ 2007    Stent SFA    VEIN HARVEST Left 01/06/2016    Procedure: LEFT GREATER SAPPHENOUS VEIN HARVEST;  Surgeon: Pryor Ochoa, MD;  Location: [facility] OR;  Service: Vascular;  Laterality: Left;          Social History         Socioeconomic History   Marital status: Widowed      Spouse name: Not on file   Number of children: 3   Years of education: 14   Highest education level: Some college, no degree  Occupational History   Occupation: Disable  Tobacco Use   Smoking status: Some  Days      Types: Cigars      Passive exposure: Current   Smokeless tobacco: Never  Vaping Use   Vaping status: Never Used  Substance and Sexual Activity   Alcohol use: Yes      Alcohol/week: 2.0 standard drinks of alcohol      Types: 2 Cans of beer per week   Drug use: No  Sexual activity: Yes  Other Topics Concern   Not on file  Social History Narrative    Lives alone. Lives in an apartment. Ground floor apartment. Smoke alarms present, no grab bars.     Has a shih-tzu, named Missy.     Patient eats a variety of foods. Drinks tea, Kool-aid, coffee         Use public transportation or brother will take places.    Some food insecurity.         Social Determinants of Health        Financial Resource Strain: Low Risk  (02/10/2023)    Overall Financial Resource Strain (CARDIA)     Difficulty of Paying Living Expenses: Not hard at all  Food Insecurity: No Food Insecurity (02/10/2023)    Hunger Vital Sign     Worried About Running Out of Food in the Last Year: Never true     Ran Out of Food in the Last Year: Never true  Transportation Needs: Unmet Transportation Needs (02/10/2023)    PRAPARE - Therapist, art (Medical): Yes     Lack of Transportation (Non-Medical): No  Physical Activity: Insufficiently Active (02/10/2023)    Exercise Vital Sign     Days of Exercise per Week: 7 days     Minutes of Exercise per Session: 10 min  Stress: No Stress Concern Present (02/10/2023)    [employer] of Occupational Health - Occupational Stress Questionnaire     Feeling of Stress : Not at all  Social Connections: Moderately Isolated (02/10/2023)    Social Connection and Isolation Panel [NHANES]     Frequency of Communication with Friends and Family: More than three times a week     Frequency of Social Gatherings with Friends and Family: Never     Attends Religious Services: Never     Database administrator or Organizations: No     Attends Banker  Meetings: Never     Marital Status: Living with partner  Intimate Partner Violence: Not At Risk (02/10/2023)    Humiliation, Afraid, Rape, and Kick questionnaire     Fear of Current or Ex-Partner: No     Emotionally Abused: No     Physically Abused: No     Sexually Abused: No           Family History  Problem Relation Age of Onset   Cancer Mother          colon Cancer   Hyperlipidemia Mother     Diabetes Mother     Heart disease Father     Hypertension Father     Cancer Sister          Uterine   Diabetes Sister     Diabetes Brother                  Current Outpatient Medications  Medication Sig Dispense Refill   albuterol (VENTOLIN HFA) 108 (90 Base) MCG/ACT inhaler TAKE 2 PUFFS BY MOUTH EVERY 6 HOURS AS NEEDED FOR WHEEZE OR SHORTNESS OF BREATH 8.5 each 1   amLODipine (NORVASC) 5 MG tablet Take 1 tablet (5 mg total) by mouth at bedtime. 90 tablet 3   amoxicillin-clavulanate (AUGMENTIN) 875-125 MG tablet Take 1 tablet by mouth 2 (two) times daily. 20 tablet 0   atorvastatin (LIPITOR) 40 MG tablet TAKE 1 TABLET BY MOUTH EVERY DAY 90 tablet 1   baclofen (LIORESAL) 10 MG tablet Take 0.5 tablets (5 mg  total) by mouth 2 (two) times daily as needed for muscle spasms. Make appointment before next refill. 30 tablet 0   benazepril (LOTENSIN) 5 MG tablet TAKE 1 TABLET BY MOUTH EVERY DAY 90 tablet 3   clopidogrel (PLAVIX) 75 MG tablet TAKE 1 TABLET BY MOUTH EVERY DAY 90 tablet 0   diclofenac Sodium (VOLTAREN) 1 % GEL Apply 2 g topically 4 (four) times daily. 50 g 1   ezetimibe (ZETIA) 10 MG tablet TAKE 1 TABLET BY MOUTH EVERY DAY 90 tablet 1   ferrous sulfate 325 (65 FE) MG EC tablet Take 1 tablet (325 mg total) by mouth daily with breakfast. (Patient not taking: Reported on 02/10/2023) 90 tablet 1   FLUoxetine (PROZAC) 10 MG capsule TAKE 1 CAPSULE BY MOUTH EVERY DAY 90 capsule 3   gabapentin (NEURONTIN) 100 MG capsule Take 1 capsule (100 mg total) by mouth 3 (three) times daily. 90  capsule 3   gabapentin (NEURONTIN) 300 MG capsule TAKE 1 CAPSULE BY MOUTH THREE TIMES A DAY AS NEEDED 90 capsule 1   metFORMIN (GLUCOPHAGE) 1000 MG tablet TAKE 1 TABLET BY MOUTH TWICE A DAY WITH FOOD 180 tablet 1   Nebivolol HCl 20 MG TABS TAKE 1 TABLET BY MOUTH EVERY DAY 90 tablet 3   nicotine polacrilex (NICORETTE) 2 MG gum Take 1 each (2 mg total) by mouth as needed for smoking cessation. 100 tablet 3   oxyCODONE-acetaminophen (PERCOCET) 5-325 MG tablet Take 1 tablet by mouth every 4 (four) hours as needed for severe pain. 30 tablet 0   pantoprazole (PROTONIX) 40 MG tablet TAKE 1 TABLET BY MOUTH EVERY DAY AS NEEDED 90 tablet 1   polyethylene glycol powder (GLYCOLAX/MIRALAX) 17 GM/SCOOP powder Take 17 g by mouth daily. 500 g 0      No current facility-administered medications for this visit.        Allergies       Allergies  Allergen Reactions   Glipizide Other (See Comments)      REACTION IS SIDE EFFECT Severe hypoglycemia to 40s.           REVIEW OF SYSTEMS:  [X]  denotes positive finding, [ ]  denotes negative finding Cardiac   Comments:  Chest pain or chest pressure:      Shortness of breath upon exertion:      Short of breath when lying flat:      Irregular heart rhythm:             Vascular      Pain in calf, thigh, or hip brought on by ambulation:      Pain in feet at night that wakes you up from your sleep:       Blood clot in your veins:      Leg swelling:              Pulmonary      Oxygen at home:      Productive cough:       Wheezing:              Neurologic      Sudden weakness in arms or legs:       Sudden numbness in arms or legs:       Sudden onset of difficulty speaking or slurred speech:      Temporary loss of vision in one eye:       Problems with dizziness:              Gastrointestinal  Blood in stool:       Vomited blood:              Genitourinary      Burning when urinating:       Blood in urine:             Psychiatric       Major depression:              Hematologic      Bleeding problems:      Problems with blood clotting too easily:             Skin      Rashes or ulcers:             Constitutional      Fever or chills:          PHYSICAL EXAMINATION:      Vitals:    07/25/23 0940  BP: 106/70  Pulse: 85  Resp: 18  Temp: 98.5 F (36.9 C)  TempSrc: Temporal  SpO2: 92%  Weight: 190 lb (86.2 kg)  Height: 5\' 7"  (1.702 m)      General:  WDWN in NAD; vital signs documented above Gait: Not observed, in wheel chair HENT: WNL, normocephalic Pulmonary: normal non-labored breathing without  wheezing Cardiac: regular HR Abdomen: soft, NT Vascular Exam/Pulses: 1+ femoral pulses bilaterally, Faint monophasic right AT and PT signals Extremities: with ischemic changes to right foot, without Gangrene , without cellulitis; with open wound on dorsum of 3nd toe, and subungual ulcer of 2nd toe. Motor and sensation intact  Musculoskeletal: no muscle wasting or atrophy       Neurologic: A&O X 3 Psychiatric:  The pt has Normal affect.     Non-Invasive Vascular Imaging:   ABI Findings:  +---------+------------------+-----+----------+--------+  Right   Rt Pressure (mmHg)IndexWaveform  Comment   +---------+------------------+-----+----------+--------+  Brachial 85                                         +---------+------------------+-----+----------+--------+  PTA     48                0.56 monophasic          +---------+------------------+-----+----------+--------+  DP      43                0.51 monophasic          +---------+------------------+-----+----------+--------+  Great Toe                       Absent              +---------+------------------+-----+----------+--------+   +--------+------------------+-----+--------+-------+  Left   Lt Pressure (mmHg)IndexWaveformComment  +--------+------------------+-----+--------+-------+  Brachial71                                      +--------+------------------+-----+--------+-------+   Right ABIs appear decreased compared to prior study on 01/24/2023.    VAS US Aorta/Iliac/IVC duplex: Right Stent(s):  +---------------+--------+---------------+----------+--------+  EIA           PSV cm/sStenosis       Waveform  Comments  +---------------+--------+---------------+----------+--------+  Prox to Stent  338     50-99% stenosismonophasic          +---------------+--------+---------------+----------+--------+  Proximal Stent 129  monophasic          +---------------+--------+---------------+----------+--------+  Mid Stent      72                     monophasic          +---------------+--------+---------------+----------+--------+  Distal Stent   103                    monophasic          +---------------+--------+---------------+----------+--------+  Distal to Stent76                     monophasic          +---------------+--------+---------------+----------+--------+   Summary:  Stenosis: +--------------------+-------------+--------------+  Location            Stenosis     Stent           +--------------------+-------------+--------------+  Right External Iliac>50% stenosis1-49% stenosis  +--------------------+-------------+--------------+      ASSESSMENT/PLAN:: 72 y.o. male here for follow up for PAD. He presents with 3 week history of ischemic rest pain and tissue loss. He was seen by Podiatrist who recommended urgent follow up with [place]. Duplex today shows increased velocities in the proximal right EIA stent suggesting > 50% with PSV of 338 cm/s - ABI also decreased from prior study 6 months ago - Has known SFA occlusion with signal vessel peroneal runoff - He knows he is at high risk of needing more proximal amputation - Continue Statin and Plavix - The patient is on best medical therapy for peripheral arterial disease. The patient  has been counseled about the risks of tobacco use in atherosclerotic disease. The patient has been counseled to abstain from any tobacco use - Recommend Angiography of RLE but he understands that this may be diagnostic in nature and he may require further surgical intervention - An aortogram with bilateral lower extremity runoff angiography and Right lower extremity intervention and is indicated to better evaluate the patient's lower extremity circulation because of the limb threatening nature of the patient's diagnosis. Based on the patient's clinical exam and non-invasive data, we anticipate an endovascular intervention in the iliac and femoropopliteal vessels. Stenting and/or athrectomy would be favored because of the improved primary patency of these interventions as compared to plain balloon angioplasty - Will arrange Angiogram, Arteriogram with Dr. Chestine Spore on 07/27/23      Graceann Congress, PA-C [facility] [phone]   Clinic MD:   Steve Rattler

## 2023-07-27 NOTE — Op Note (Signed)
Patient name: [name] MRN: [id number] DOB: 1951-05-22 Sex: male  07/27/2023 Pre-operative Diagnosis: Critical limb ischemia of the right lower extremity with tissue loss Post-operative diagnosis:  Same Surgeon:  Cephus Shelling, MD Procedure Performed: 1.  Ultrasound-guided access right common femoral artery 2.  Aortogram with catheter selection of aorta 3.  Right lower extremity arteriogram with runoff from right femoral sheath 4.  Angioplasty and stent of right common iliac artery (8 mm x 39 mm VBX) 5.  Angioplasty and stent of the right external iliac artery (7 mm x 29 mm VBX) 6.  Mynx closure of the right common femoral artery 7.  49 minutes of monitored moderate conscious sedation time  Indications: 72 year old male seen with tissue loss in the right lower extremity.  He is well-known to vascular surgery and has had extensive interventions including previous right external iliac stenting by Dr. Edilia Bo, right common femoral endarterectomy, and failed right SFA stents.  He is high risk for limb loss.  He presents after risks and benefits discussed.  Findings:   Ultrasound-guided access right common femoral artery ( I could not feel a left femoral pulse).  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  His infrarenal aorta is patent.  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  There was a high grade stenosis distal to his existing right external iliac stent.  The right common femoral and profunda are patent.  The SFA has a flush occlusion.  Distally he reconstitutes peroneal in the calf as the only flow into the foot.   We did a pullback pressure this dropped 30 mmHg from the infrarenal aorta to the distal right common iliac artery and this was then stented with an 8 mm x 39 mm VBX.  I then had a normal aortic pressure of 160 mm Hg distal right common iliac artery in the sheath after it was stented.  I then did an additional pullback  pressure in the right external iliac artery there was another 20 mm Hg point drop through the external iliac stent with some high-grade stenosis in the distal segment.  This was extended with a 7 mm x 29 mm VBX. Much better femoral pulse at completion.  No residual stenosis.      Procedure:  The patient was identified in the holding area and taken to room 8.  The patient was then placed supine on the table and prepped and draped in the usual sterile fashion.  A time out was called.  The patient received Versed and fentanyl for conscious moderate sedation.  Vital signs were monitored including heart rate, respiratory rate, oxygenation and blood pressure.  I was present for all of moderate sedation.  Ultrasound was used to evaluate the right common femoral artery.  It was patent .  A digital ultrasound image was acquired.  A micropuncture needle was used to access the right common femoral artery under ultrasound guidance.  An 018 wire was advanced without resistance and a micropuncture sheath was placed.  The 018 wire was removed and a benson wire was placed.  The micropuncture sheath was exchanged for a 5 french sheath.  An omniflush catheter was advanced over the wire to the level of L-1.  An abdominal angiogram was obtained.  We also get oblique images to look at the right iliac.  I then shot the right lower extremity runoff through the right femoral sheath.  Pertinent findings are noted above.  Ultimately elected  to do a pullback pressure as he had diffuse disease in his right iliac artery.  His infrarenal aortic pressure was 160 mm Hg with a straight catheter and we pulled this into the distal right common iliac artery and the pressure dropped 30 mmHg.  I then elected to stent the right common iliac artery and used a Glidewire advantage and upsized to a 7 [place] Brite tip sheath.  Patient was given 100 units/kg IV heparin.  We stented the right common iliac artery with an 8 mm x 39 mm VBX with excellent results  and no residual stenosis.  His right hypogastric was occluded on initial imaging and fills through collaterals off a lumbar.  When I did a pullback pressure into the external iliac stent that was placed years ago this dropped another 20 mmHg.  I elected to extend the VBX stent in the iliac on the right external iliac with another 7 mm x 29 mm VBX.  Excellent results.  He now has a much better right femoral pulse.  Mynx closure was deployed in the right common femoral artery.  Plan: Excellent results after right common and external iliac stenting.  Now has a right femoral pulse that is bounding.  I discussed right femoral to peroneal bypass for limb salvage versus primary amputation.  This will be high risk as he is limited outflow into the foot with bypass.  He wishes to proceed with bypass for limb salvage.   Cephus Shelling, MD [facility] Office: [phone]

## 2023-07-28 ENCOUNTER — Other Ambulatory Visit: Payer: Self-pay | Admitting: *Deleted

## 2023-07-28 ENCOUNTER — Other Ambulatory Visit: Payer: Self-pay | Admitting: Student

## 2023-07-28 ENCOUNTER — Encounter (HOSPITAL_COMMUNITY): Payer: Self-pay | Admitting: Vascular Surgery

## 2023-07-28 ENCOUNTER — Encounter: Payer: Self-pay | Admitting: *Deleted

## 2023-07-28 DIAGNOSIS — I70221 Atherosclerosis of native arteries of extremities with rest pain, right leg: Secondary | ICD-10-CM

## 2023-07-28 DIAGNOSIS — E7800 Pure hypercholesterolemia, unspecified: Secondary | ICD-10-CM

## 2023-07-28 MED FILL — Heparin Sodium (Porcine) Inj 1000 Unit/ML: INTRAMUSCULAR | Qty: 10 | Status: AC

## 2023-07-31 ENCOUNTER — Telehealth: Payer: Self-pay

## 2023-07-31 ENCOUNTER — Other Ambulatory Visit: Payer: Self-pay | Admitting: Student

## 2023-07-31 NOTE — Telephone Encounter (Signed)
Pt called requesting pain med refill.  Pt called again.  Reviewed pt's chart, returned call for clarification, two identifiers used. Informed him that there aren't typically any pain meds prescribed after an aortogram. He still had Oxy on his med list from Dr. Allena Katz. He stated that he was out of those. He began to cry and asked, "Does he know how bad my foot is?" And hung up.  Sent pt a Mychart msg stating that he would have to be evaluated in the office before being prescribed anything. Offered to schedule an appt for tomorrow, if he wanted. Will wait for response.

## 2023-08-02 ENCOUNTER — Telehealth: Payer: Self-pay

## 2023-08-02 NOTE — Telephone Encounter (Signed)
Pt called requesting pain medication.  Reviewed pt's chart, returned call for clarification, no answer, lf vm stating that pt would need to be seen in office.  Called wife's phone, two identifiers used. Offered her an appt with the PA for tomorrow, 8/8 @ 1430. Accepted and scheduled.

## 2023-08-03 ENCOUNTER — Other Ambulatory Visit: Payer: Self-pay | Admitting: Physician Assistant

## 2023-08-03 ENCOUNTER — Telehealth: Payer: Self-pay

## 2023-08-03 ENCOUNTER — Ambulatory Visit (INDEPENDENT_AMBULATORY_CARE_PROVIDER_SITE_OTHER): Payer: Medicare HMO | Admitting: Podiatry

## 2023-08-03 DIAGNOSIS — Z91199 Patient's noncompliance with other medical treatment and regimen due to unspecified reason: Secondary | ICD-10-CM

## 2023-08-03 MED ORDER — OXYCODONE-ACETAMINOPHEN 5-325 MG PO TABS: 1.0000 | ORAL_TABLET | ORAL | 0 refills | Status: DC | PRN

## 2023-08-03 NOTE — Telephone Encounter (Signed)
Pt canceled appt for today d/t weather and pain.  Spoke with [place], [place] to request pain med for pt until his surgery on 8/14. Matt e-scribed med.  Called pt's wife, Tyler Aas, two identifiers used. Informed her of med refill. She was very Adult nurse. Confirmed understanding.

## 2023-08-03 NOTE — Progress Notes (Signed)
No charge. 

## 2023-08-04 ENCOUNTER — Encounter (HOSPITAL_COMMUNITY): Payer: Self-pay | Admitting: Vascular Surgery

## 2023-08-04 ENCOUNTER — Other Ambulatory Visit: Payer: Self-pay | Admitting: Student

## 2023-08-04 ENCOUNTER — Other Ambulatory Visit: Payer: Self-pay

## 2023-08-04 NOTE — Progress Notes (Addendum)
PCP - Erick Alley Cardiologist -   PPM/ICD - Denies Device Orders - n/a Rep Notified - n/a  Chest x-ray - denies EKG - 07-27-23 Stress Test - denies ECHO - 09-15-22 Cardiac Cath - 2013  CPAP - denies    Fasting Blood Sugar - states : around 100 Checks Blood Sugar once a/day Made aware to hold Metformin  Blood Thinner Instructions: yes Plavix patient instructed to follow surgeons instruction LAST DOSE 08-02-23 Aspirin Instructions: Patient to follow surgeons instruction  ERAS Protocol - NPO  COVID TEST- denies any symptoms  Anesthesia review: yes cardiac history, Left above the amputee  Patient verbally denies any shortness of breath, fever, cough and chest pain during phone call   -------------  SDW INSTRUCTIONS given:  Your procedure is scheduled on August 14,2024.  Report to [facility] Main Entrance "A" at 6:15 A.M., and check in at the Admitting office.  Call this number if you have problems the morning of surgery:  [phone]   Remember:  Do not eat after midnight the night before your surgery      Take these medicines the morning of surgery with A SIP OF WATER   aspirin EC     atorvastatin (LIPITOR)   ezetimibe (ZETIA)   FLUoxetine (PROZAC)   gabapentin (NEURONTIN)   pantoprazole (PROTONIX)    clopidogrel (PLAVIX)   FOLLOW DIRECTIONS GIVEN BY YOUR SURGEON LAST DOSE 08-02-23  IF NEEDED:  baclofen (LIORESAL)   oxyCODONE-acetaminophen (PERCOCET)   albuterol (VENTOLIN HFA)   As of today, STOP taking any Aspirin (unless otherwise instructed by your surgeon) Aleve, Naproxen, Ibuprofen, Motrin, Advil, Goody's, BC's, all herbal medications, fish oil, and all vitamins. diclofenac Sodium (VOLTAREN)  DO NOT TAKE METFORMIN MORNING MORNING SURGERY(GLUCOPHAGE)      ** PLEASE check your blood sugar the morning of your surgery when you wake up and every 2 hours until you get to the Short Stay unit.  If your blood sugar is less than 70 mg/dL, you will need to treat  for low blood sugar: Do not take insulin. Treat a low blood sugar (less than 70 mg/dL) with  cup of clear juice (cranberry or apple), 4 glucose tablets, OR glucose gel. Recheck blood sugar in 15 minutes after treatment (to make sure it is greater than 70 mg/dL). If your blood sugar is not greater than 70 mg/dL on recheck, call [phone] for further instructions.                       Do not wear jewelry, make up, or nail polish            Do not wear lotions, powders, perfumes/colognes, or deodorant.            Do not shave 48 hours prior to surgery.  Men may shave face and neck.            Do not bring valuables to the hospital.            [facility] is not responsible for any belongings or valuables.  Do NOT Smoke (Tobacco/Vaping) 24 hours prior to your procedure If you use a CPAP at night, you may bring all equipment for your overnight stay.   Contacts, glasses, dentures or bridgework may not be worn into surgery.      For patients admitted to the hospital, discharge time will be determined by your treatment team.   Patients discharged the day of surgery will not be allowed to  drive home, and someone needs to stay with them for 24 hours.    Special instructions:   [place]- Preparing For Surgery  Before surgery, you can play an important role. Because skin is not sterile, your skin needs to be as free of germs as possible. You can reduce the number of germs on your skin by washing with CHG (chlorahexidine gluconate) Soap before surgery.  CHG is an antiseptic cleaner which kills germs and bonds with the skin to continue killing germs even after washing.    Oral Hygiene is also important to reduce your risk of infection.  Remember - BRUSH YOUR TEETH THE MORNING OF SURGERY WITH YOUR REGULAR TOOTHPASTE  Please do not use if you have an allergy to CHG or antibacterial soaps. If your skin becomes reddened/irritated stop using the CHG.  Do not shave (including legs and underarms)  for at least 48 hours prior to first CHG shower. It is OK to shave your face.  Please follow these instructions carefully.   Shower the NIGHT BEFORE SURGERY and the MORNING OF SURGERY with DIAL Soap.   Pat yourself dry with a CLEAN TOWEL.  Wear CLEAN PAJAMAS to bed the night before surgery  Place CLEAN SHEETS on your bed the night of your first shower and DO NOT SLEEP WITH PETS.   Day of Surgery: Please shower morning of surgery  Wear Clean/Comfortable clothing the morning of surgery Do not apply any deodorants/lotions.   Remember to brush your teeth WITH YOUR REGULAR TOOTHPASTE.   Questions were answered. Patient verbalized understanding of instructions.

## 2023-08-08 NOTE — Anesthesia Preprocedure Evaluation (Signed)
Anesthesia Evaluation  Patient identified by MRN, date of birth, ID band Patient awake    Reviewed: Allergy & Precautions, NPO status , Patient's Chart, lab work & pertinent test results, reviewed documented beta blocker date and time   Airway Mallampati: II  TM Distance: >3 FB     Dental  (+) Edentulous Upper, Edentulous Lower   Pulmonary shortness of breath and with exertion, pneumonia, resolved, Current Smoker and Patient abstained from smoking.   Pulmonary exam normal breath sounds clear to auscultation       Cardiovascular hypertension, Pt. on medications and Pt. on home beta blockers + Peripheral Vascular Disease  Normal cardiovascular exam Rhythm:Regular Rate:Normal  Critical limb ischemia RLE  EKG 07/27/2023 NSR w/ PACs, LVH, T wave Abn consider lateral ischemia  Echo 09/15/22 1. Left ventricular ejection fraction, by estimation, is 55%. The left  ventricle has normal function. The left ventricle has no regional wall  motion abnormalities. There is moderate left ventricular hypertrophy. Left  ventricular diastolic parameters are   indeterminate.   2. Right ventricular systolic function is normal. The right ventricular  size is normal. Tricuspid regurgitation signal is inadequate for assessing  PA pressure.   3. The mitral valve is normal in structure. Trivial mitral valve  regurgitation. No evidence of mitral stenosis.   4. The aortic valve is tricuspid. There is mild calcification of the  aortic valve. Aortic valve regurgitation is not visualized. Aortic valve  sclerosis is present, with no evidence of aortic valve stenosis.   5. The inferior vena cava is normal in size with greater than 50%  respiratory variability, suggesting right atrial pressure of 3 mmHg.      Neuro/Psych  PSYCHIATRIC DISORDERS  Depression    Peripheral neuropathy Neuromuscular disease    GI/Hepatic Neg liver ROS,GERD  Medicated,,  Endo/Other   diabetes, Well Controlled, Type 2, Oral Hypoglycemic Agents  Hyperlipidemia  Renal/GU negative Renal ROS  negative genitourinary   Musculoskeletal  (+) Arthritis , Osteoarthritis,  Cervical DDD   Abdominal   Peds  Hematology  (+) Blood dyscrasia, anemia Plavix therapy   Anesthesia Other Findings   Reproductive/Obstetrics                              Anesthesia Physical Anesthesia Plan  ASA: 3  Anesthesia Plan: General   Post-op Pain Management: Precedex and Lidocaine infusion*   Induction: Intravenous  PONV Risk Score and Plan: 2 and Treatment may vary due to age or medical condition and Ondansetron  Airway Management Planned: Oral ETT  Additional Equipment: Arterial line  Intra-op Plan:   Post-operative Plan: Extubation in OR  Informed Consent: I have reviewed the patients History and Physical, chart, labs and discussed the procedure including the risks, benefits and alternatives for the proposed anesthesia with the patient or authorized representative who has indicated his/her understanding and acceptance.       Plan Discussed with: Anesthesiologist and CRNA  Anesthesia Plan Comments:          Anesthesia Quick Evaluation

## 2023-08-09 ENCOUNTER — Inpatient Hospital Stay (HOSPITAL_COMMUNITY): Payer: Medicare HMO | Admitting: Physician Assistant

## 2023-08-09 ENCOUNTER — Other Ambulatory Visit: Payer: Self-pay

## 2023-08-09 ENCOUNTER — Inpatient Hospital Stay (HOSPITAL_COMMUNITY)
Admission: RE | Admit: 2023-08-09 | Discharge: 2023-08-16 | DRG: 253 | Disposition: A | Discharge status: Rehabilitation Facility | Payer: Medicare HMO | Attending: Vascular Surgery | Admitting: Vascular Surgery

## 2023-08-09 ENCOUNTER — Encounter (HOSPITAL_COMMUNITY): Payer: Self-pay | Admitting: Vascular Surgery

## 2023-08-09 ENCOUNTER — Encounter (HOSPITAL_COMMUNITY)
Admission: RE | Disposition: A | Discharge status: Rehabilitation Facility | Payer: Self-pay | Source: Home / Self Care | Attending: Vascular Surgery

## 2023-08-09 DIAGNOSIS — Z794 Long term (current) use of insulin: Secondary | ICD-10-CM | POA: Diagnosis not present

## 2023-08-09 DIAGNOSIS — Z7902 Long term (current) use of antithrombotics/antiplatelets: Secondary | ICD-10-CM

## 2023-08-09 DIAGNOSIS — E11621 Type 2 diabetes mellitus with foot ulcer: Secondary | ICD-10-CM | POA: Diagnosis not present

## 2023-08-09 DIAGNOSIS — Z981 Arthrodesis status: Secondary | ICD-10-CM | POA: Diagnosis not present

## 2023-08-09 DIAGNOSIS — D72829 Elevated white blood cell count, unspecified: Secondary | ICD-10-CM | POA: Diagnosis not present

## 2023-08-09 DIAGNOSIS — L97518 Non-pressure chronic ulcer of other part of right foot with other specified severity: Secondary | ICD-10-CM | POA: Diagnosis present

## 2023-08-09 DIAGNOSIS — I70221 Atherosclerosis of native arteries of extremities with rest pain, right leg: Secondary | ICD-10-CM

## 2023-08-09 DIAGNOSIS — Z89612 Acquired absence of left leg above knee: Secondary | ICD-10-CM | POA: Diagnosis not present

## 2023-08-09 DIAGNOSIS — N179 Acute kidney failure, unspecified: Secondary | ICD-10-CM | POA: Diagnosis not present

## 2023-08-09 DIAGNOSIS — I872 Venous insufficiency (chronic) (peripheral): Secondary | ICD-10-CM | POA: Diagnosis present

## 2023-08-09 DIAGNOSIS — F17200 Nicotine dependence, unspecified, uncomplicated: Secondary | ICD-10-CM | POA: Diagnosis not present

## 2023-08-09 DIAGNOSIS — Z95820 Peripheral vascular angioplasty status with implants and grafts: Secondary | ICD-10-CM | POA: Diagnosis not present

## 2023-08-09 DIAGNOSIS — E1142 Type 2 diabetes mellitus with diabetic polyneuropathy: Secondary | ICD-10-CM | POA: Diagnosis present

## 2023-08-09 DIAGNOSIS — Z800 Family history of malignant neoplasm of digestive organs: Secondary | ICD-10-CM

## 2023-08-09 DIAGNOSIS — I10 Essential (primary) hypertension: Secondary | ICD-10-CM | POA: Diagnosis not present

## 2023-08-09 DIAGNOSIS — E1151 Type 2 diabetes mellitus with diabetic peripheral angiopathy without gangrene: Principal | ICD-10-CM | POA: Diagnosis present

## 2023-08-09 DIAGNOSIS — R531 Weakness: Secondary | ICD-10-CM | POA: Diagnosis present

## 2023-08-09 DIAGNOSIS — I4891 Unspecified atrial fibrillation: Secondary | ICD-10-CM | POA: Diagnosis not present

## 2023-08-09 DIAGNOSIS — I70235 Atherosclerosis of native arteries of right leg with ulceration of other part of foot: Secondary | ICD-10-CM | POA: Diagnosis not present

## 2023-08-09 DIAGNOSIS — K219 Gastro-esophageal reflux disease without esophagitis: Secondary | ICD-10-CM | POA: Diagnosis present

## 2023-08-09 DIAGNOSIS — D62 Acute posthemorrhagic anemia: Secondary | ICD-10-CM | POA: Diagnosis not present

## 2023-08-09 DIAGNOSIS — Z888 Allergy status to other drugs, medicaments and biological substances status: Secondary | ICD-10-CM

## 2023-08-09 DIAGNOSIS — I6523 Occlusion and stenosis of bilateral carotid arteries: Secondary | ICD-10-CM | POA: Diagnosis not present

## 2023-08-09 DIAGNOSIS — I739 Peripheral vascular disease, unspecified: Secondary | ICD-10-CM | POA: Diagnosis present

## 2023-08-09 DIAGNOSIS — K5900 Constipation, unspecified: Secondary | ICD-10-CM | POA: Diagnosis not present

## 2023-08-09 DIAGNOSIS — Z993 Dependence on wheelchair: Secondary | ICD-10-CM

## 2023-08-09 DIAGNOSIS — Z7984 Long term (current) use of oral hypoglycemic drugs: Secondary | ICD-10-CM

## 2023-08-09 DIAGNOSIS — F17210 Nicotine dependence, cigarettes, uncomplicated: Secondary | ICD-10-CM

## 2023-08-09 DIAGNOSIS — E785 Hyperlipidemia, unspecified: Secondary | ICD-10-CM | POA: Diagnosis present

## 2023-08-09 DIAGNOSIS — Z5941 Food insecurity: Secondary | ICD-10-CM

## 2023-08-09 DIAGNOSIS — I491 Atrial premature depolarization: Secondary | ICD-10-CM | POA: Diagnosis not present

## 2023-08-09 DIAGNOSIS — Z8249 Family history of ischemic heart disease and other diseases of the circulatory system: Secondary | ICD-10-CM

## 2023-08-09 DIAGNOSIS — Z5982 Transportation insecurity: Secondary | ICD-10-CM

## 2023-08-09 DIAGNOSIS — F32A Depression, unspecified: Secondary | ICD-10-CM

## 2023-08-09 DIAGNOSIS — Z79899 Other long term (current) drug therapy: Secondary | ICD-10-CM

## 2023-08-09 DIAGNOSIS — E1140 Type 2 diabetes mellitus with diabetic neuropathy, unspecified: Secondary | ICD-10-CM | POA: Diagnosis present

## 2023-08-09 DIAGNOSIS — Z7409 Other reduced mobility: Secondary | ICD-10-CM | POA: Diagnosis present

## 2023-08-09 DIAGNOSIS — I8391 Asymptomatic varicose veins of right lower extremity: Secondary | ICD-10-CM | POA: Diagnosis present

## 2023-08-09 DIAGNOSIS — G479 Sleep disorder, unspecified: Secondary | ICD-10-CM | POA: Diagnosis not present

## 2023-08-09 DIAGNOSIS — Z833 Family history of diabetes mellitus: Secondary | ICD-10-CM

## 2023-08-09 DIAGNOSIS — Z83438 Family history of other disorder of lipoprotein metabolism and other lipidemia: Secondary | ICD-10-CM

## 2023-08-09 DIAGNOSIS — R5381 Other malaise: Secondary | ICD-10-CM | POA: Diagnosis not present

## 2023-08-09 DIAGNOSIS — F17290 Nicotine dependence, other tobacco product, uncomplicated: Secondary | ICD-10-CM | POA: Diagnosis present

## 2023-08-09 DIAGNOSIS — I959 Hypotension, unspecified: Secondary | ICD-10-CM | POA: Diagnosis not present

## 2023-08-09 DIAGNOSIS — Z7982 Long term (current) use of aspirin: Secondary | ICD-10-CM

## 2023-08-09 HISTORY — PX: VEIN HARVEST: SHX6363

## 2023-08-09 HISTORY — PX: BYPASS GRAFT FEMORAL-PERONEAL: SHX5762

## 2023-08-09 HISTORY — DX: Atherosclerosis of native arteries of extremities with rest pain, right leg: I70.221

## 2023-08-09 LAB — CBC
HCT: 32.8 % — ABNORMAL LOW (ref 39.0–52.0)
HCT: 30.8 % — ABNORMAL LOW (ref 39.0–52.0)
HCT: 27.7 % — ABNORMAL LOW (ref 39.0–52.0)
Hemoglobin: 8.1 g/dL — ABNORMAL LOW (ref 13.0–17.0)
Hemoglobin: 8.8 g/dL — ABNORMAL LOW (ref 13.0–17.0)
Hemoglobin: 7.8 g/dL — ABNORMAL LOW (ref 13.0–17.0)
MCH: 15.4 pg — ABNORMAL LOW (ref 26.0–34.0)
MCH: 18.9 pg — ABNORMAL LOW (ref 26.0–34.0)
MCH: 19.1 pg — ABNORMAL LOW (ref 26.0–34.0)
MCHC: 24.7 g/dL — ABNORMAL LOW (ref 30.0–36.0)
MCHC: 28.6 g/dL — ABNORMAL LOW (ref 30.0–36.0)
MCHC: 28.2 g/dL — ABNORMAL LOW (ref 30.0–36.0)
MCV: 62.5 fL — ABNORMAL LOW (ref 80.0–100.0)
MCV: 66.1 fL — ABNORMAL LOW (ref 80.0–100.0)
MCV: 67.7 fL — ABNORMAL LOW (ref 80.0–100.0)
Platelets: 582 10*3/uL — ABNORMAL HIGH (ref 150–400)
Platelets: 373 10*3/uL (ref 150–400)
Platelets: 338 10*3/uL (ref 150–400)
RBC: 5.25 MIL/uL (ref 4.22–5.81)
RBC: 4.66 MIL/uL (ref 4.22–5.81)
RBC: 4.09 MIL/uL — ABNORMAL LOW (ref 4.22–5.81)
RDW: 25.2 % — ABNORMAL HIGH (ref 11.5–15.5)
RDW: 25.4 % — ABNORMAL HIGH (ref 11.5–15.5)
RDW: 25.3 % — ABNORMAL HIGH (ref 11.5–15.5)
WBC: 7.8 10*3/uL (ref 4.0–10.5)
WBC: 11.3 10*3/uL — ABNORMAL HIGH (ref 4.0–10.5)
WBC: 8.9 10*3/uL (ref 4.0–10.5)
nRBC: 0.3 % — ABNORMAL HIGH (ref 0.0–0.2)
nRBC: 0 % (ref 0.0–0.2)
nRBC: 0.2 % (ref 0.0–0.2)

## 2023-08-09 LAB — URINALYSIS, ROUTINE W REFLEX MICROSCOPIC
Bilirubin Urine: NEGATIVE
Glucose, UA: NEGATIVE mg/dL
Hgb urine dipstick: NEGATIVE
Ketones, ur: NEGATIVE mg/dL
Nitrite: POSITIVE — AB
Protein, ur: NEGATIVE mg/dL
Specific Gravity, Urine: 1.018 (ref 1.005–1.030)
pH: 5 (ref 5.0–8.0)

## 2023-08-09 LAB — GLUCOSE, CAPILLARY
Glucose-Capillary: 133 mg/dL — ABNORMAL HIGH (ref 70–99)
Glucose-Capillary: 141 mg/dL — ABNORMAL HIGH (ref 70–99)
Glucose-Capillary: 122 mg/dL — ABNORMAL HIGH (ref 70–99)

## 2023-08-09 LAB — COMPREHENSIVE METABOLIC PANEL
ALT: 13 U/L (ref 0–44)
AST: 19 U/L (ref 15–41)
Albumin: 3.2 g/dL — ABNORMAL LOW (ref 3.5–5.0)
Alkaline Phosphatase: 86 U/L (ref 38–126)
Anion gap: 12 (ref 5–15)
BUN: 9 mg/dL (ref 8–23)
CO2: 25 mmol/L (ref 22–32)
Calcium: 9.1 mg/dL (ref 8.9–10.3)
Chloride: 98 mmol/L (ref 98–111)
Creatinine, Ser: 0.73 mg/dL (ref 0.61–1.24)
GFR, Estimated: >60 mL/min (ref >60)
Glucose, Bld: 144 mg/dL — ABNORMAL HIGH (ref 70–99)
Potassium: 4 mmol/L (ref 3.5–5.1)
Sodium: 135 mmol/L (ref 135–145)
Total Bilirubin: 0.4 mg/dL (ref 0.3–1.2)
Total Protein: 8.4 g/dL — ABNORMAL HIGH (ref 6.5–8.1)

## 2023-08-09 LAB — POCT I-STAT 7, (LYTES, BLD GAS, ICA,H+H)
Acid-base deficit: 4 mmol/L — ABNORMAL HIGH (ref 0.0–2.0)
Bicarbonate: 21.3 mmol/L (ref 20.0–28.0)
Calcium, Ion: 1.02 mmol/L — ABNORMAL LOW (ref 1.15–1.40)
HCT: 26 % — ABNORMAL LOW (ref 39.0–52.0)
Hemoglobin: 8.8 g/dL — ABNORMAL LOW (ref 13.0–17.0)
O2 Saturation: 100 %
Patient temperature: 37
Potassium: 3.9 mmol/L (ref 3.5–5.1)
Sodium: 139 mmol/L (ref 135–145)
TCO2: 22 mmol/L (ref 22–32)
pCO2 arterial: 38.6 mmHg (ref 32–48)
pH, Arterial: 7.349 — ABNORMAL LOW (ref 7.35–7.45)
pO2, Arterial: 210 mmHg — ABNORMAL HIGH (ref 83–108)

## 2023-08-09 LAB — PROTIME-INR
INR: 1.2 (ref 0.8–1.2)
Prothrombin Time: 15.1 seconds (ref 11.4–15.2)

## 2023-08-09 LAB — SURGICAL PCR SCREEN
MRSA, PCR: NEGATIVE
Staphylococcus aureus: POSITIVE — AB

## 2023-08-09 LAB — PREPARE RBC (CROSSMATCH)

## 2023-08-09 LAB — HEMOGLOBIN A1C
Hgb A1c MFr Bld: 6.4 % — ABNORMAL HIGH (ref 4.8–5.6)
Mean Plasma Glucose: 136.98 mg/dL

## 2023-08-09 LAB — BASIC METABOLIC PANEL
Anion gap: 10 (ref 5–15)
BUN: 10 mg/dL (ref 8–23)
CO2: 25 mmol/L (ref 22–32)
Calcium: 9 mg/dL (ref 8.9–10.3)
Chloride: 98 mmol/L (ref 98–111)
Creatinine, Ser: 0.62 mg/dL (ref 0.61–1.24)
GFR, Estimated: >60 mL/min (ref >60)
Glucose, Bld: 150 mg/dL — ABNORMAL HIGH (ref 70–99)
Potassium: 3.9 mmol/L (ref 3.5–5.1)
Sodium: 133 mmol/L — ABNORMAL LOW (ref 135–145)

## 2023-08-09 LAB — POCT ACTIVATED CLOTTING TIME
Activated Clotting Time: 195 seconds
Activated Clotting Time: 207 seconds
Activated Clotting Time: 256 seconds

## 2023-08-09 LAB — APTT: aPTT: 29 seconds (ref 24–36)

## 2023-08-09 SURGERY — CREATION, BYPASS, ARTERIAL, FEMORAL TO PERONEAL, USING GRAFT
Anesthesia: General | Site: Leg Upper | Laterality: Right

## 2023-08-09 MED ORDER — OXYCODONE HCL 5 MG/5ML PO SOLN: 5.0000 mg | Freq: Once | ORAL | Status: DC | PRN

## 2023-08-09 MED ORDER — AMLODIPINE BESYLATE 5 MG PO TABS
5.0000 mg | ORAL_TABLET | Freq: Every day | ORAL | Status: DC
  Administered 2023-08-09 – 2023-08-10 (×2): 5 mg via ORAL
  Filled 2023-08-09 (×2): qty 1

## 2023-08-09 MED ORDER — INSULIN ASPART 100 UNIT/ML IJ SOLN
0.0000 [IU] | Freq: Three times a day (TID) | INTRAMUSCULAR | Status: DC
  Administered 2023-08-10: 3 [IU] via SUBCUTANEOUS
  Administered 2023-08-10 – 2023-08-14 (×7): 2 [IU] via SUBCUTANEOUS

## 2023-08-09 MED ORDER — ONDANSETRON HCL 4 MG/2ML IJ SOLN: 4.0000 mg | Freq: Once | INTRAMUSCULAR | Status: DC | PRN

## 2023-08-09 MED ORDER — LACTATED RINGERS IV SOLN: INTRAVENOUS | Status: DC | PRN

## 2023-08-09 MED ORDER — CHLORHEXIDINE GLUCONATE CLOTH 2 % EX PADS
6.0000 | MEDICATED_PAD | Freq: Every day | CUTANEOUS | Status: AC
  Administered 2023-08-10 – 2023-08-14 (×5): 6 via TOPICAL

## 2023-08-09 MED ORDER — DOCUSATE SODIUM 100 MG PO CAPS
100.0000 mg | ORAL_CAPSULE | Freq: Every day | ORAL | Status: DC
  Administered 2023-08-10 – 2023-08-15 (×6): 100 mg via ORAL
  Filled 2023-08-09 (×7): qty 1

## 2023-08-09 MED ORDER — ORAL CARE MOUTH RINSE: 15.0000 mL | Freq: Once | OROMUCOSAL | Status: AC

## 2023-08-09 MED ORDER — ACETAMINOPHEN 325 MG PO TABS
325.0000 mg | ORAL_TABLET | ORAL | Status: DC | PRN
  Administered 2023-08-10 – 2023-08-15 (×5): 650 mg via ORAL
  Filled 2023-08-09 (×5): qty 2

## 2023-08-09 MED ORDER — KETAMINE HCL 50 MG/5ML IJ SOSY
PREFILLED_SYRINGE | INTRAMUSCULAR | Status: AC
  Filled 2023-08-09: qty 5

## 2023-08-09 MED ORDER — VASOPRESSIN 20 UNITS/100 ML INFUSION FOR SHOCK
0.0000 [IU]/min | INTRAVENOUS | Status: DC
  Filled 2023-08-09: qty 100

## 2023-08-09 MED ORDER — ALBUMIN HUMAN 5 % IV SOLN: INTRAVENOUS | Status: DC | PRN

## 2023-08-09 MED ORDER — METFORMIN HCL 500 MG PO TABS
1000.0000 mg | ORAL_TABLET | Freq: Two times a day (BID) | ORAL | Status: DC
  Administered 2023-08-10 – 2023-08-16 (×12): 1000 mg via ORAL
  Filled 2023-08-09 (×13): qty 2

## 2023-08-09 MED ORDER — CEFAZOLIN SODIUM-DEXTROSE 2-4 GM/100ML-% IV SOLN
2.0000 g | Freq: Three times a day (TID) | INTRAVENOUS | Status: AC
  Administered 2023-08-09 – 2023-08-10 (×2): 2 g via INTRAVENOUS
  Filled 2023-08-09 (×2): qty 100

## 2023-08-09 MED ORDER — SODIUM CHLORIDE 0.9 % IV SOLN: INTRAVENOUS | Status: DC

## 2023-08-09 MED ORDER — CEFAZOLIN SODIUM-DEXTROSE 2-4 GM/100ML-% IV SOLN
2.0000 g | INTRAVENOUS | Status: DC
  Filled 2023-08-09: qty 100

## 2023-08-09 MED ORDER — BACLOFEN 10 MG PO TABS
5.0000 mg | ORAL_TABLET | Freq: Two times a day (BID) | ORAL | Status: DC | PRN
  Administered 2023-08-14 – 2023-08-15 (×3): 5 mg via ORAL
  Filled 2023-08-09 (×3): qty 1

## 2023-08-09 MED ORDER — ONDANSETRON HCL 4 MG/2ML IJ SOLN
INTRAMUSCULAR | Status: DC | PRN
  Administered 2023-08-09: 4 mg via INTRAVENOUS

## 2023-08-09 MED ORDER — HEPARIN SODIUM (PORCINE) 5000 UNIT/ML IJ SOLN
5000.0000 [IU] | Freq: Three times a day (TID) | INTRAMUSCULAR | Status: DC
  Administered 2023-08-09 – 2023-08-16 (×22): 5000 [IU] via SUBCUTANEOUS
  Filled 2023-08-09 (×20): qty 1

## 2023-08-09 MED ORDER — BENAZEPRIL HCL 5 MG PO TABS
5.0000 mg | ORAL_TABLET | Freq: Every day | ORAL | Status: DC
  Administered 2023-08-10: 5 mg via ORAL
  Filled 2023-08-09 (×2): qty 1

## 2023-08-09 MED ORDER — LABETALOL HCL 5 MG/ML IV SOLN: 10.0000 mg | INTRAVENOUS | Status: DC | PRN

## 2023-08-09 MED ORDER — FLUOXETINE HCL 10 MG PO CAPS
10.0000 mg | ORAL_CAPSULE | Freq: Every day | ORAL | Status: DC
  Administered 2023-08-10 – 2023-08-16 (×7): 10 mg via ORAL
  Filled 2023-08-09 (×7): qty 1

## 2023-08-09 MED ORDER — PROTAMINE SULFATE 10 MG/ML IV SOLN
INTRAVENOUS | Status: DC | PRN
  Administered 2023-08-09 (×4): 10 mg via INTRAVENOUS

## 2023-08-09 MED ORDER — HYDRALAZINE HCL 20 MG/ML IJ SOLN: 5.0000 mg | INTRAMUSCULAR | Status: DC | PRN

## 2023-08-09 MED ORDER — ACETAMINOPHEN 325 MG RE SUPP: 325.0000 mg | RECTAL | Status: DC | PRN

## 2023-08-09 MED ORDER — HYDROMORPHONE HCL 1 MG/ML IJ SOLN
INTRAMUSCULAR | Status: AC
  Filled 2023-08-09: qty 1

## 2023-08-09 MED ORDER — FENTANYL CITRATE (PF) 250 MCG/5ML IJ SOLN
INTRAMUSCULAR | Status: DC | PRN
  Administered 2023-08-09: 100 ug via INTRAVENOUS
  Administered 2023-08-09: 50 ug via INTRAVENOUS

## 2023-08-09 MED ORDER — CHLORHEXIDINE GLUCONATE CLOTH 2 % EX PADS: 6.0000 | MEDICATED_PAD | Freq: Once | CUTANEOUS | Status: DC

## 2023-08-09 MED ORDER — PHENYLEPHRINE 80 MCG/ML (10ML) SYRINGE FOR IV PUSH (FOR BLOOD PRESSURE SUPPORT)
PREFILLED_SYRINGE | INTRAVENOUS | Status: DC | PRN
  Administered 2023-08-09: 160 ug via INTRAVENOUS

## 2023-08-09 MED ORDER — BISACODYL 5 MG PO TBEC
5.0000 mg | DELAYED_RELEASE_TABLET | Freq: Every day | ORAL | Status: DC | PRN
  Administered 2023-08-11 – 2023-08-14 (×2): 5 mg via ORAL
  Filled 2023-08-09 (×2): qty 1

## 2023-08-09 MED ORDER — PROPOFOL 10 MG/ML IV BOLUS
INTRAVENOUS | Status: DC | PRN
Start: 2023-08-09 — End: 2023-08-09
  Administered 2023-08-09: 10 mg via INTRAVENOUS

## 2023-08-09 MED ORDER — ALBUTEROL SULFATE HFA 108 (90 BASE) MCG/ACT IN AERS: 2.0000 | INHALATION_SPRAY | Freq: Four times a day (QID) | RESPIRATORY_TRACT | Status: DC | PRN

## 2023-08-09 MED ORDER — SODIUM CHLORIDE 0.9% IV SOLUTION: Freq: Once | INTRAVENOUS | Status: DC

## 2023-08-09 MED ORDER — PROPOFOL 10 MG/ML IV BOLUS
INTRAVENOUS | Status: AC
  Filled 2023-08-09: qty 20

## 2023-08-09 MED ORDER — OXYCODONE-ACETAMINOPHEN 5-325 MG PO TABS
1.0000 | ORAL_TABLET | ORAL | Status: DC | PRN
  Administered 2023-08-09 – 2023-08-16 (×29): 2 via ORAL
  Filled 2023-08-09 (×29): qty 2

## 2023-08-09 MED ORDER — FENTANYL CITRATE (PF) 250 MCG/5ML IJ SOLN
INTRAMUSCULAR | Status: AC
  Filled 2023-08-09: qty 5

## 2023-08-09 MED ORDER — GUAIFENESIN-DM 100-10 MG/5ML PO SYRP: 15.0000 mL | ORAL_SOLUTION | ORAL | Status: DC | PRN

## 2023-08-09 MED ORDER — ASPIRIN 81 MG PO TBEC
81.0000 mg | DELAYED_RELEASE_TABLET | Freq: Every day | ORAL | Status: DC
  Administered 2023-08-09 – 2023-08-16 (×8): 81 mg via ORAL
  Filled 2023-08-09 (×8): qty 1

## 2023-08-09 MED ORDER — PANTOPRAZOLE SODIUM 40 MG PO TBEC
40.0000 mg | DELAYED_RELEASE_TABLET | Freq: Every day | ORAL | Status: DC
  Administered 2023-08-09 – 2023-08-16 (×8): 40 mg via ORAL
  Filled 2023-08-09 (×8): qty 1

## 2023-08-09 MED ORDER — CHLORHEXIDINE GLUCONATE 0.12 % MT SOLN
15.0000 mL | Freq: Once | OROMUCOSAL | Status: AC
  Administered 2023-08-09: 15 mL via OROMUCOSAL
  Filled 2023-08-09: qty 15

## 2023-08-09 MED ORDER — INSULIN ASPART 100 UNIT/ML IJ SOLN: 0.0000 [IU] | INTRAMUSCULAR | Status: DC | PRN

## 2023-08-09 MED ORDER — CALCIUM CHLORIDE 10 % IV SOLN
INTRAVENOUS | Status: DC | PRN
  Administered 2023-08-09: 1 g via INTRAVENOUS

## 2023-08-09 MED ORDER — NICOTINE POLACRILEX 2 MG MT GUM: 2.0000 mg | CHEWING_GUM | OROMUCOSAL | Status: DC | PRN

## 2023-08-09 MED ORDER — ORAL CARE MOUTH RINSE: 15.0000 mL | OROMUCOSAL | Status: DC | PRN

## 2023-08-09 MED ORDER — HEMOSTATIC AGENTS (NO CHARGE) OPTIME
TOPICAL | Status: DC | PRN
Start: 2023-08-09 — End: 2023-08-09
  Administered 2023-08-09: 1 via TOPICAL

## 2023-08-09 MED ORDER — ALBUTEROL SULFATE (2.5 MG/3ML) 0.083% IN NEBU
2.5000 mg | INHALATION_SOLUTION | Freq: Four times a day (QID) | RESPIRATORY_TRACT | Status: DC | PRN
  Filled 2023-08-09: qty 3

## 2023-08-09 MED ORDER — ONDANSETRON HCL 4 MG/2ML IJ SOLN: 4.0000 mg | Freq: Four times a day (QID) | INTRAMUSCULAR | Status: DC | PRN

## 2023-08-09 MED ORDER — NEBIVOLOL HCL 10 MG PO TABS
20.0000 mg | ORAL_TABLET | Freq: Every day | ORAL | Status: DC
  Administered 2023-08-10 – 2023-08-16 (×5): 20 mg via ORAL
  Filled 2023-08-09 (×7): qty 2

## 2023-08-09 MED ORDER — MUPIROCIN 2 % EX OINT
1.0000 | TOPICAL_OINTMENT | Freq: Two times a day (BID) | CUTANEOUS | Status: AC
  Administered 2023-08-09 – 2023-08-14 (×9): 1 via NASAL
  Filled 2023-08-09 (×2): qty 22

## 2023-08-09 MED ORDER — ALUM & MAG HYDROXIDE-SIMETH 200-200-20 MG/5ML PO SUSP: 15.0000 mL | ORAL | Status: DC | PRN

## 2023-08-09 MED ORDER — HYDROMORPHONE HCL 1 MG/ML IJ SOLN
0.5000 mg | INTRAMUSCULAR | Status: DC | PRN
  Administered 2023-08-09 – 2023-08-15 (×21): 1 mg via INTRAVENOUS
  Filled 2023-08-09 (×22): qty 1

## 2023-08-09 MED ORDER — GABAPENTIN 300 MG PO CAPS
300.0000 mg | ORAL_CAPSULE | Freq: Three times a day (TID) | ORAL | Status: DC
  Administered 2023-08-09 – 2023-08-16 (×21): 300 mg via ORAL
  Filled 2023-08-09 (×21): qty 1

## 2023-08-09 MED ORDER — POLYETHYLENE GLYCOL 3350 17 GM/SCOOP PO POWD
17.0000 g | Freq: Every day | ORAL | Status: DC
  Administered 2023-08-11 – 2023-08-14 (×4): 17 g via ORAL
  Filled 2023-08-09 (×2): qty 255

## 2023-08-09 MED ORDER — ROCURONIUM BROMIDE 10 MG/ML (PF) SYRINGE
PREFILLED_SYRINGE | INTRAVENOUS | Status: AC
  Filled 2023-08-09: qty 20

## 2023-08-09 MED ORDER — PHENYLEPHRINE HCL-NACL 20-0.9 MG/250ML-% IV SOLN
INTRAVENOUS | Status: DC | PRN
  Administered 2023-08-09: 100 ug/min via INTRAVENOUS

## 2023-08-09 MED ORDER — KETAMINE HCL 10 MG/ML IJ SOLN
INTRAMUSCULAR | Status: DC | PRN
  Administered 2023-08-09: 20 mg via INTRAVENOUS

## 2023-08-09 MED ORDER — CLOPIDOGREL BISULFATE 75 MG PO TABS
75.0000 mg | ORAL_TABLET | Freq: Every day | ORAL | Status: DC
  Administered 2023-08-09 – 2023-08-16 (×8): 75 mg via ORAL
  Filled 2023-08-09 (×8): qty 1

## 2023-08-09 MED ORDER — HEPARIN 6000 UNIT IRRIGATION SOLUTION
Status: AC
  Filled 2023-08-09: qty 500

## 2023-08-09 MED ORDER — VASOPRESSIN 20 UNIT/ML IV SOLN
INTRAVENOUS | Status: DC | PRN
  Administered 2023-08-09 (×3): 2 [IU] via INTRAVENOUS

## 2023-08-09 MED ORDER — KETAMINE HCL 100 MG/ML IJ SOLN
INTRAMUSCULAR | Status: AC
  Filled 2023-08-09: qty 1

## 2023-08-09 MED ORDER — PANTOPRAZOLE SODIUM 40 MG PO TBEC: 40.0000 mg | DELAYED_RELEASE_TABLET | Freq: Every day | ORAL | Status: DC | PRN

## 2023-08-09 MED ORDER — ATORVASTATIN CALCIUM 40 MG PO TABS
40.0000 mg | ORAL_TABLET | Freq: Every day | ORAL | Status: DC
  Administered 2023-08-10 – 2023-08-16 (×7): 40 mg via ORAL
  Filled 2023-08-09 (×7): qty 1

## 2023-08-09 MED ORDER — METOPROLOL TARTRATE 5 MG/5ML IV SOLN: 2.0000 mg | INTRAVENOUS | Status: DC | PRN

## 2023-08-09 MED ORDER — HEPARIN SODIUM (PORCINE) 1000 UNIT/ML IJ SOLN
INTRAMUSCULAR | Status: DC | PRN
  Administered 2023-08-09: 3000 [IU] via INTRAVENOUS
  Administered 2023-08-09: 9000 [IU] via INTRAVENOUS
  Administered 2023-08-09: 3000 [IU] via INTRAVENOUS

## 2023-08-09 MED ORDER — HYDROMORPHONE HCL 1 MG/ML IJ SOLN
0.2500 mg | INTRAMUSCULAR | Status: DC | PRN
  Administered 2023-08-09 (×2): 0.5 mg via INTRAVENOUS

## 2023-08-09 MED ORDER — MAGNESIUM SULFATE 2 GM/50ML IV SOLN: 2.0000 g | Freq: Every day | INTRAVENOUS | Status: DC | PRN

## 2023-08-09 MED ORDER — EZETIMIBE 10 MG PO TABS
10.0000 mg | ORAL_TABLET | Freq: Every day | ORAL | Status: DC
  Administered 2023-08-10 – 2023-08-16 (×7): 10 mg via ORAL
  Filled 2023-08-09 (×7): qty 1

## 2023-08-09 MED ORDER — POTASSIUM CHLORIDE CRYS ER 20 MEQ PO TBCR: 20.0000 meq | EXTENDED_RELEASE_TABLET | Freq: Every day | ORAL | Status: DC | PRN

## 2023-08-09 MED ORDER — SODIUM CHLORIDE 0.9 % IV SOLN: 500.0000 mL | Freq: Once | INTRAVENOUS | Status: DC | PRN

## 2023-08-09 MED ORDER — ROCURONIUM BROMIDE 10 MG/ML (PF) SYRINGE
PREFILLED_SYRINGE | INTRAVENOUS | Status: DC | PRN
  Administered 2023-08-09: 20 mg via INTRAVENOUS
  Administered 2023-08-09: 80 mg via INTRAVENOUS
  Administered 2023-08-09 (×2): 20 mg via INTRAVENOUS

## 2023-08-09 MED ORDER — PHENOL 1.4 % MT LIQD: 1.0000 | OROMUCOSAL | Status: DC | PRN

## 2023-08-09 MED ORDER — LACTATED RINGERS IV SOLN: INTRAVENOUS | Status: DC

## 2023-08-09 MED ORDER — 0.9 % SODIUM CHLORIDE (POUR BTL) OPTIME
TOPICAL | Status: DC | PRN
  Administered 2023-08-09: 2000 mL

## 2023-08-09 MED ORDER — VASOPRESSIN 20 UNIT/ML IV SOLN
INTRAVENOUS | Status: AC
  Filled 2023-08-09: qty 1

## 2023-08-09 MED ORDER — HEPARIN 6000 UNIT IRRIGATION SOLUTION
Status: DC | PRN
  Administered 2023-08-09: 1

## 2023-08-09 MED ORDER — VASOPRESSIN 20 UNIT/ML IV SOLN
INTRAVENOUS | Status: DC | PRN
  Administered 2023-08-09: .03 [IU]/min via INTRAVENOUS

## 2023-08-09 MED ORDER — OXYCODONE HCL 5 MG PO TABS: 5.0000 mg | ORAL_TABLET | Freq: Once | ORAL | Status: DC | PRN

## 2023-08-09 MED ORDER — SUGAMMADEX SODIUM 200 MG/2ML IV SOLN
INTRAVENOUS | Status: DC | PRN
  Administered 2023-08-09: 300 mg via INTRAVENOUS

## 2023-08-09 SURGICAL SUPPLY — 65 items
ADH SKN CLS APL DERMABOND .7 (GAUZE/BANDAGES/DRESSINGS) ×8
AGENT HMST SPONGE THK3/8 (HEMOSTASIS)
BAG COUNTER SPONGE SURGICOUNT (BAG) ×3 IMPLANT
BAG SPNG CNTER NS LX DISP (BAG) ×2
BANDAGE ESMARK 6X9 LF (GAUZE/BANDAGES/DRESSINGS) IMPLANT
BLADE CLIPPER SURG (BLADE) ×3 IMPLANT
BNDG CMPR 9X6 STRL LF SNTH (GAUZE/BANDAGES/DRESSINGS) ×2
BNDG ESMARK 6X9 LF (GAUZE/BANDAGES/DRESSINGS) ×2
CANISTER SUCT 3000ML PPV (MISCELLANEOUS) ×3 IMPLANT
CANNULA VESSEL 3MM 2 BLNT TIP (CANNULA) IMPLANT
CLIP TI MEDIUM 24 (CLIP) ×3 IMPLANT
CLIP TI WIDE RED SMALL 24 (CLIP) ×3 IMPLANT
COVER PROBE W GEL 5X96 (DRAPES) ×3 IMPLANT
CUFF TOURN SGL QUICK 18X4 (TOURNIQUET CUFF) IMPLANT
CUFF TOURN SGL QUICK 24 (TOURNIQUET CUFF) ×2
CUFF TOURN SGL QUICK 34 (TOURNIQUET CUFF)
CUFF TOURN SGL QUICK 42 (TOURNIQUET CUFF) IMPLANT
CUFF TRNQT CYL 24X4X16.5-23 (TOURNIQUET CUFF) IMPLANT
CUFF TRNQT CYL 34X4.125X (TOURNIQUET CUFF) IMPLANT
DERMABOND ADVANCED .7 DNX12 (GAUZE/BANDAGES/DRESSINGS) IMPLANT
DRAIN CHANNEL 15F RND FF W/TCR (WOUND CARE) IMPLANT
DRAIN RELI 100 BL SUC LF ST (DRAIN)
DRAPE C-ARM 42X72 X-RAY (DRAPES) IMPLANT
ELECT REM PT RETURN 9FT ADLT (ELECTROSURGICAL) ×2
ELECTRODE REM PT RTRN 9FT ADLT (ELECTROSURGICAL) ×3 IMPLANT
EVACUATOR SILICONE 100CC (DRAIN) IMPLANT
GAUZE 4X4 16PLY ~~LOC~~+RFID DBL (SPONGE) IMPLANT
GLOVE BIO SURGEON STRL SZ7.5 (GLOVE) ×3 IMPLANT
GLOVE BIOGEL PI IND STRL 8 (GLOVE) ×3 IMPLANT
GOWN STRL REUS W/ TWL LRG LVL3 (GOWN DISPOSABLE) ×6 IMPLANT
GOWN STRL REUS W/ TWL XL LVL3 (GOWN DISPOSABLE) ×6 IMPLANT
GOWN STRL REUS W/TWL LRG LVL3 (GOWN DISPOSABLE) ×4
GOWN STRL REUS W/TWL XL LVL3 (GOWN DISPOSABLE) ×4
HEMOSTAT SNOW SURGICEL 2X4 (HEMOSTASIS) IMPLANT
HEMOSTAT SPONGE AVITENE ULTRA (HEMOSTASIS) IMPLANT
INSERT FOGARTY SM (MISCELLANEOUS) IMPLANT
KIT BASIN OR (CUSTOM PROCEDURE TRAY) ×3 IMPLANT
KIT TURNOVER KIT B (KITS) ×3 IMPLANT
LOOP VASCULAR MINI 18 RED (MISCELLANEOUS) ×2
NS IRRIG 1000ML POUR BTL (IV SOLUTION) ×6 IMPLANT
PACK PERIPHERAL VASCULAR (CUSTOM PROCEDURE TRAY) ×3 IMPLANT
PAD ARMBOARD 7.5X6 YLW CONV (MISCELLANEOUS) ×6 IMPLANT
PUNCH AORTIC ROTATE 5MM 8IN (MISCELLANEOUS) IMPLANT
SPONGE T-LAP 18X18 ~~LOC~~+RFID (SPONGE) IMPLANT
STAPLER VISISTAT 35W (STAPLE) IMPLANT
SUT GORETEX 5 0 TT13 24 (SUTURE) IMPLANT
SUT GORETEX 6.0 TT13 (SUTURE) IMPLANT
SUT MNCRL AB 4-0 PS2 18 (SUTURE) ×6 IMPLANT
SUT PROLENE 5 0 C 1 24 (SUTURE) ×3 IMPLANT
SUT PROLENE 6 0 BV (SUTURE) ×3 IMPLANT
SUT PROLENE 7 0 BV 1 (SUTURE) IMPLANT
SUT SILK 2 0 PERMA HAND 18 BK (SUTURE) IMPLANT
SUT SILK 2 0 SH (SUTURE) ×3 IMPLANT
SUT SILK 3 0 (SUTURE) ×8
SUT SILK 3-0 18XBRD TIE 12 (SUTURE) IMPLANT
SUT VIC AB 2-0 CT1 27 (SUTURE) ×4
SUT VIC AB 2-0 CT1 TAPERPNT 27 (SUTURE) ×6 IMPLANT
SUT VIC AB 3-0 SH 27 (SUTURE) ×8
SUT VIC AB 3-0 SH 27X BRD (SUTURE) ×6 IMPLANT
TOWEL GREEN STERILE (TOWEL DISPOSABLE) ×3 IMPLANT
TRAY FOLEY MTR SLVR 16FR STAT (SET/KITS/TRAYS/PACK) ×3 IMPLANT
TUBING EXTENTION W/L.L. (IV SETS) ×3 IMPLANT
UNDERPAD 30X36 HEAVY ABSORB (UNDERPADS AND DIAPERS) ×3 IMPLANT
VASCULAR TIE MINI RED 18IN STL (MISCELLANEOUS) IMPLANT
WATER STERILE IRR 1000ML POUR (IV SOLUTION) ×3 IMPLANT

## 2023-08-09 NOTE — Anesthesia Procedure Notes (Signed)
Procedure Name: Intubation Date/Time: 08/09/2023 8:51 AM  Performed by: Gwenyth Allegra, CRNAPre-anesthesia Checklist: Patient identified, Emergency Drugs available, Suction available, Patient being monitored and Timeout performed Patient Re-evaluated:Patient Re-evaluated prior to induction Oxygen Delivery Method: Circle system utilized Preoxygenation: Pre-oxygenation with 100% oxygen Induction Type: IV induction Ventilation: Mask ventilation without difficulty Laryngoscope Size: Mac and 4 Grade View: Grade II Tube type: Oral Airway Equipment and Method: Stylet Placement Confirmation: ETT inserted through vocal cords under direct vision, positive ETCO2 and breath sounds checked- equal and bilateral Secured at: 22 cm Tube secured with: Tape Dental Injury: Teeth and Oropharynx as per pre-operative assessment

## 2023-08-09 NOTE — Progress Notes (Signed)
Seen in recovery.  Palpable pulse in right leg bypass with a brisk peroneal signal.  Mentating appropriately.  Adequate urine output.  Postop hemoglobin 8.8.  Creatinine 0.6.  Overall looks good.  Will put in ICU overnight to monitor as we have had difficulty getting accurate blood pressures and the A-line had dampened waveform.  Maps now over 80 on the right arm cuff pressure.  Cephus Shelling, MD [facility] Office: [phone]   Cephus Shelling

## 2023-08-09 NOTE — Op Note (Signed)
Date: August 09, 2023  Preoperative diagnosis: Critical limb ischemia of the right lower extremity with tissue loss  Postoperative diagnosis: Same  Procedure: 1.  Redo exposure of right common femoral artery greater than 30 days 2.  Harvest of right lower extremity great saphenous vein 3.  Right common femoral artery to peroneal artery bypass with ipsilateral reversed great saphenous vein  Surgeon: Dr. Cephus Shelling, MD  Assistant: Mosetta Pigeon, [place]  Indications: 72 year old male with critical limb ischemia with tissue loss in the right lower extremity.  He has previously had a right femoral endarterectomy in the past.  He recently presented with worsening tissue loss in the right lower extremity.  He underwent stenting of his right common and external iliac artery to improve his inflow prior to surgery today.  He has severe infrainguinal disease with only peroneal runoff.  He presents today for right common femoral artery to peroneal artery bypass after risk benefits discussed.  An assistant was needed for vein harvest and also for sewing the anastomosis given the complexity of the case.  Findings: Transverse incision in the right groin where we did redo exposure of the right common femoral artery.  This was very scarred in place requiring very tedious dissection given multiple previous right groin surgeries.  The saphenous vein in the right leg was then harvested from the saphenofemoral junction all the way down to the distal calf.  There were lots of branches that required ligation given significant varicosities with venous insufficiency.  The peroneal artery was exposed in the mid calf through the saphenectomy harvest site and was soft for anastomosis.  Ultimately a end to side anastomosis was sewn to the right common femoral artery with great saphenous vein in reversed fashion.  We then tunneled this bypass through the saphenectomy incisions and then sewed it end to side to the  peroneal artery in the mid calf using a tourniquet.  Brisk peroneal signal at completion.  Anesthesia: General  EBL: 250 mL  Resuscitation: 3 units albumin and 3 units PRBCs  Details: Patient was taken to the operating room after informed consent was obtained.  Placed on operative table in supine position.  General endotracheal anesthesia was induced.  I then used ultrasound to mark the great saphenous vein in the right leg from the saphenofemoral junction down to the distal calf.  The vein was of usable caliber although he had multiple branches due to significant varicosities with underlying chronic venous insufficiency.  The right groin and right leg were prepped and draped in standard sterile fashion.  Antibiotics were given and timeout performed.  Initially went to the right groin and made a transverse incision and ultimately dissected down through the subcutaneous tissue and used cerebellar retractors.  There was extensive scar tissue from previous right groin surgery including endarterectomy in the past.  Through extensive mobilization we were able to dissect out the common femoral artery as well as up under the inguinal ligament to get control of the external iliac artery and placed a vessel loop for inflow control.  I then made multiple skip incisions down the right thigh and calf and the great saphenous vein was harvested between the skin bridges and all side branches were ligated between 3-0 silk ties clips and divided.  We harvested this all the way down to the distal calf and once we had a good length this was transected over a right angle clamp passed between the skin tunnels and then oversewn at the saphenofemoral junction.  I then went down to the saphenous vein harvest incision in the medial calf and dissected taking down the soleus to enter the deep posterior compartment.  The posterior tibial artery and vein were preserved and left up and we dissected and found peroneal artery and vein.   The artery was dissected away from the vein and we use Meyerding retractors and placed a vessel loop.  The vein was reversed with the vessel cannula and dilated nicely.  Several branches were repaired with 6-0 Prolene.  We then gave the patient 100 units/kg IV heparin.  An ACT was checked to maintain greater than 250.  I used a Henley clamp on the distal external iliac with a Satinsky clamp in the right groin at the previous common femoral bovine patch to control the common femoral artery.  This was opened with 11 blade scalpel and potts scissors and I used an aortic punch.  I then reversed the great saphenous vein and this was spatulated and end to side anastomosis was sewn to the right common femoral artery parachute technique with 6-0 prolene using the help of my assistant.  Once we came off clamps everything was de-aired and we had good pulsatile flow in the bypass.  This was marked for orientation it was carefully passed between the skin bridges through the saphenectomy incision site using an aortic clamp.  We then straightened the leg and had just enough length to get to the distal target in the peroneal artery.  We then exsanguinated the leg with an Esmarch retractor and placed a tourniquet on the upper thigh inflated to 250 mmHg.  The peroneal artery was then opened with 11 blade scalpel and Potts scissors.  There was backbleeding here.  The vein was just long enough to reach the distal target.  This was spatulated and end to side anastomosis was sewn to the peroneal artery with 6-0 Prolene parachute technique with the help of my assistant.  We had excellent pulse in the bypass.  There was a good peroneal signal at the ankle.  Protamine was given for reversal.  I then closed the groin incision after this was irrigated out and we had hemostasis with multiple 2-0 Vicryl, 3-0 Vicryl, 4-0 Monocryl and Dermabond.  The vein harvest incision where we had tunneled the bypass was then closed with 3-0 Vicryl 4-0  Monocryl and Dermabond.  Awakened from anesthesia and taken to recovery in stable condition.  Complication: None  Condition: Stable, will place in ICU overnight given soft BP intra-operatively   Cephus Shelling, MD [facility] [place] Office: [phone]   Cephus Shelling

## 2023-08-09 NOTE — Transfer of Care (Signed)
Immediate Anesthesia Transfer of Care Note  Patient: [name]  Procedure(s) Performed: RIGHT  FEMORAL  ARTERY TO PERONEAL ARTERY BYPASS GRAFT USING GREATER SAPHENOUS VEIN (Right: Leg Upper) RIGHT GREATER SAPHENOUS VEIN HARVEST (Right: Leg Upper) REDO RIGHT GROIN EXPOSURE GREATER THAN 30 DAYS (Right: Groin)  Patient Location: PACU  Anesthesia Type:General  Level of Consciousness: drowsy  Airway & Oxygen Therapy: Patient Spontanous Breathing and Patient connected to face mask oxygen  Post-op Assessment: Report given to RN and Post -op Vital signs reviewed and stable  Post vital signs: Reviewed and stable  Last Vitals:  Vitals Value Taken Time  BP 98/62 08/09/23 1430  Temp    Pulse 76 08/09/23 1431  Resp 22 08/09/23 1432  SpO2 100 % 08/09/23 1431  Vitals shown include unfiled device data.  Last Pain:  Vitals:   08/09/23 0727  TempSrc: Oral  PainSc:       Patients Stated Pain Goal: 2 (08/09/23 0723)  Complications: No notable events documented.

## 2023-08-09 NOTE — H&P (Addendum)
History and Physical Interval Note:  08/09/2023 8:00 AM  [name]  has presented today for surgery, with the diagnosis of critical limb ischemia with tissue loss.  The various methods of treatment have been discussed with the patient and family. After consideration of risks, benefits and other options for treatment, the patient has consented to  Procedure(s): RIGHT BYPASS GRAFT FEMORAL-PERONEAL (Right) as a surgical intervention.  The patient's history has been reviewed, patient examined, no change in status, stable for surgery.  I have reviewed the patient's chart and labs.  Questions were answered to the patient's satisfaction.    Discussed plan for right common femoral to peroneal bypass for critical limb ischemia with tissue loss.  Underwent angiogram on 07/27/2023 with stenting of his right common and external iliac artery to improve inflow.  He has long segment infrainguinal occlusion with fush SFA occlusion and only peroneal reconstitution.  Poor outflow into the foot and discussed this may not be adequate for limb salvage and may have progression of his tissue loss leading to limb loss.  He is not interested in amputation which we discussed as an alternative option.  Discussed vein for conduit may not be long enough and require graft. Risk-benefit discussed.  Cephus Shelling   Office Note        CC:  follow up Requesting Provider:  Erick Alley, DO   HPI: [name] is a 72 y.o. (Jan 18, 1951) male who presents for follow up of PAD and carotid stenosis. He is recently underwent left AKA in February of 2022 by Dr. Lenell Antu. He has history also of right EIA stenting in 2018 by Dr. Edilia Bo and more recently right femoral endarterectomy in April of 2021 by Dr. Chestine Spore.    He has overall done okay following his AKA. He has had issues with neuropathic pain that has limited his ability to use a prosthesis. He has not had any right lower extremity issues since his procedures. He has been  without rest pain or tissue loss. However, he does not ambulate so hard to assess claudication symptoms   Today he reports significant pain in right foot. This has been going on for about 3 weeks. He says he started noticing discoloration, which then progressed to some wounds on the toes and significant pain in the foot. The pain is keeping him awake at night. He was seen by his podiatrist, Dr. Annamary Rummage on 7/26 who recommended urgent evaluation in our office with concerns of ischemia of right foot.He has known SFA occlusion with single vessel peroneal runoff based off of prior Angiogram in 2018. He does continue to have sharp pains in left AKA. He does use right leg for transerring and standing but it is limited.   He denies any visual changes, slurred speech, facial drooping, unilateral upper or lower extremity weakness or numbness. His carotids on last duplex in January were 1-39% on right and 40-59% on left.    The pt is on a statin for cholesterol management.  The pt is not on a daily aspirin.   Other AC:  Plavix The pt is on CCB, BB, ACEI for hypertension.   The pt is diabetic.  Tobacco hx:  current, some day smoker       Past Medical History:  Diagnosis Date   Adhesive capsulitis 03/10/2020   Anemia     Angiodysplasia of small intestine 03/29/2018    Enteroscopy was significant for angiodysplasia 03/29/2018   Arthritis      OA  Cervical radiculopathy      Dr. 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   Critical lower limb ischemia (HCC) 12/19/2019   Diabetes mellitus      takes Metformin daily   GERD (gastroesophageal reflux disease)      takes Protonix daily   History of blood transfusion      "related to low HgB" ((09/10/2015   Hyperlipidemia      takes Vytorin daily   Hypertension      takes Benazepril and Bystolic daily   PAD (peripheral artery disease) (HCC)     Pneumonia     Radiculopathy, cervical region  02/11/2017   Shortness of breath dyspnea      with exertion   Tobacco user     Toe fracture, right 05/09/2011   Wears glasses                 Past Surgical History:  Procedure Laterality Date   ABDOMINAL AORTOGRAM W/LOWER EXTREMITY N/A 12/11/2017    Procedure: ABDOMINAL AORTOGRAM W/LOWER EXTREMITY;  Surgeon: Chuck Hint, MD;  Location: [facility] INVASIVE CV LAB;  Service: Cardiovascular;  Laterality: N/A;   ABDOMINAL AORTOGRAM W/LOWER EXTREMITY Bilateral 04/22/2020    Procedure: ABDOMINAL AORTOGRAM W/LOWER EXTREMITY;  Surgeon: Cephus Shelling, MD;  Location: [facility] INVASIVE CV LAB;  Service: Cardiovascular;  Laterality: Bilateral;   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   AMPUTATION Left 02/19/2021    Procedure: LEFT ABOVE KNEE AMPUTATION;  Surgeon: Leonie Douglas, MD;  Location: [facility] OR;  Service: Vascular;  Laterality: Left;   ANTERIOR CERVICAL DECOMP/DISCECTOMY FUSION   03/08/12    C6-7   ANTERIOR CERVICAL DECOMP/DISCECTOMY FUSION   03/08/2012    Procedure: ANTERIOR CERVICAL DECOMPRESSION/DISCECTOMY FUSION 1 LEVEL/HARDWARE REMOVAL;  Surgeon: Maeola Harman, MD;  Location: [facility] NEURO ORS;  Service: Neurosurgery;  Laterality: N/A;  revison of C5-7 anterior cervical decompression with fusion with Cervical Five-Thoracic One anterior cervical decompression with fusion with interbody prothesis plating and bonegraft   BACK SURGERY   1996   BIOPSY   12/05/2020    Procedure: BIOPSY;  Surgeon: Meryl Dare, MD;  [facility] ENDOSCOPY;  Service: Endoscopy;;   BYPASS GRAFT FEMORAL-PERONEAL Left 11/11/2016    Procedure: REDO LEFT FEMORAL-PERONEAL BYPASS WITH PROPATEN X 80CM GRAFT;  Surgeon: Chuck Hint, MD;  Location: [facility] OR;  Service: Vascular;  Laterality: Left;   COLONOSCOPY W/ BIOPSIES AND POLYPECTOMY   08/17/2012    f/u 5 years, 4 polyps, no high grade dysplasia or malignancy, tubular adenoma, hyperplastic polyops   COLONOSCOPY WITH PROPOFOL N/A 12/05/2020    Procedure:  COLONOSCOPY WITH PROPOFOL;  Surgeon: Meryl Dare, MD;  Location: [facility] ENDOSCOPY;  Service: Endoscopy;  Laterality: N/A;   EMBOLECTOMY Left 12/19/2019    Procedure: LEFT FEMERAL- PERONEAL THROMBECTOMY;  Surgeon: Cephus Shelling, MD;  Location: [facility] OR;  Service: Vascular;  Laterality: Left;   ENDARTERECTOMY FEMORAL Right 04/24/2020    Procedure: RIGHT FEMORAL ENDARTERECTOMY;  Surgeon: Cephus Shelling, MD;  Location: [facility] OR;  Service: Vascular;  Laterality: Right;   ENTEROSCOPY N/A 12/11/2015    Procedure: ENTEROSCOPY;  Surgeon: Jeani Hawking, MD;  Location: [facility] ENDOSCOPY;  Service: Endoscopy;  Laterality: N/A;   ENTEROSCOPY N/A 03/29/2018    Procedure: ENTEROSCOPY;  Surgeon: Jeani Hawking, MD;  Location: [facility] ENDOSCOPY;  Service: Endoscopy;  Laterality: N/A;   ESOPHAGOGASTRODUODENOSCOPY   08/17/2012    normal esophagus and GEJ,  diffuse gastritis with erythema- no malignancy, reactive gastropathy  with focal intestinal metaplasia   ESOPHAGOGASTRODUODENOSCOPY (EGD) WITH PROPOFOL N/A 12/05/2020    Procedure: ESOPHAGOGASTRODUODENOSCOPY (EGD) WITH PROPOFOL;  Surgeon: Meryl Dare, MD;  Location: [facility] ENDOSCOPY;  Service: Endoscopy;  Laterality: N/A;   FEMORAL-POPLITEAL BYPASS GRAFT Left 01/06/2016    Procedure: Left  COMMON FEMORAL-BELOW KNEE POPLITEAL ARTERY Bypass using non-reversed translocated saphenous vein graft from left leg;  Surgeon: Pryor Ochoa, MD;  Location: [facility] OR;  Service: Vascular;  Laterality: Left;   GIVENS CAPSULE STUDY N/A 11/24/2015    Procedure: GIVENS CAPSULE STUDY;  Surgeon: Charna Elizabeth, MD;  Location: [facility] ENDOSCOPY;  Service: Endoscopy;  Laterality: N/A;   HOT HEMOSTASIS N/A 03/29/2018    Procedure: HOT HEMOSTASIS (ARGON PLASMA COAGULATION/BICAP);  Surgeon: Jeani Hawking, MD;  Location: [facility] ENDOSCOPY;  Service: Endoscopy;  Laterality: N/A;   INGUINAL HERNIA REPAIR   1990's    right   INTRAOPERATIVE ARTERIOGRAM Left 01/06/2016    Procedure: INTRA OPERATIVE ARTERIOGRAM LEFT  LOWER LEG;  Surgeon: Pryor Ochoa, MD;  Location: [facility] OR;  Service: Vascular;  Laterality: Left;   INTRAOPERATIVE ARTERIOGRAM Left 11/11/2016    Procedure: INTRA OPERATIVE ARTERIOGRAM LEFT LOWER EXTRIMITY;  Surgeon: Chuck Hint, MD;  Location: [facility] OR;  Service: Vascular;  Laterality: Left;   IR ANGIOGRAM FOLLOW UP STUDY   12/11/2017   IR GENERIC HISTORICAL   10/24/2016    IR ANGIOGRAM FOLLOW UP STUDY   LOWER EXTREMITY ANGIOGRAM Bilateral 07/30/2015    Procedure: Lower Extremity Angiogram;  Surgeon: Fransisco Hertz, MD;  Location: [facility] INVASIVE CV LAB;  Service: Cardiovascular;  Laterality: Bilateral;   LUMBAR DISC SURGERY   1996    "lower"   PATCH ANGIOPLASTY Left 12/19/2019    Procedure: LEFT FEMORAL -PERONEAL PATCH ANGIOPLASTY WITH XENOSURE BIOLOGIC PATCH  ;  Surgeon: Cephus Shelling, MD;  Location: [facility] OR;  Service: Vascular;  Laterality: Left;   PATCH ANGIOPLASTY Right 04/24/2020    Procedure: Patch Angioplasty of Right Femoral Artery using Long Xenosure Biologic Patch 1cm x 14 cm;  Surgeon: Cephus Shelling, MD;  Location: [facility] OR;  Service: Vascular;  Laterality: Right;   PERIPHERAL VASCULAR CATHETERIZATION N/A 07/30/2015    Procedure: Abdominal Aortogram;  Surgeon: Fransisco Hertz, MD;  Location: [facility] INVASIVE CV LAB;  Service: Cardiovascular;  Laterality: N/A;   PERIPHERAL VASCULAR CATHETERIZATION N/A 10/24/2016    Procedure: Abdominal Aortogram;  Surgeon: Chuck Hint, MD;  Location: [facility] INVASIVE CV LAB;  Service: Cardiovascular;  Laterality: N/A;   PERIPHERAL VASCULAR CATHETERIZATION N/A 10/24/2016    Procedure: Lower Extremity Angiography;  Surgeon: Chuck Hint, MD;  Location: [facility] INVASIVE CV LAB;  Service: Cardiovascular;  Laterality: N/A;   PERIPHERAL VASCULAR INTERVENTION Right 12/11/2017    Procedure: PERIPHERAL VASCULAR INTERVENTION;  Surgeon: Chuck Hint, MD;  Location: [facility] INVASIVE CV LAB;  Service: Cardiovascular;  Laterality: Right;   VASCULAR  SURGERY   ~ 2007    Stent SFA    VEIN HARVEST Left 01/06/2016    Procedure: LEFT GREATER SAPPHENOUS VEIN HARVEST;  Surgeon: Pryor Ochoa, MD;  Location: [facility] OR;  Service: Vascular;  Laterality: Left;          Social History         Socioeconomic History   Marital status: Widowed      Spouse name: Not on file   Number of children: 3   Years of education: 14   Highest education level: Some college,  no degree  Occupational History   Occupation: Disable  Tobacco Use   Smoking status: Some Days      Types: Cigars      Passive exposure: Current   Smokeless tobacco: Never  Vaping Use   Vaping status: Never Used  Substance and Sexual Activity   Alcohol use: Yes      Alcohol/week: 2.0 standard drinks of alcohol      Types: 2 Cans of beer per week   Drug use: No   Sexual activity: Yes  Other Topics Concern   Not on file  Social History Narrative    Lives alone. Lives in an apartment. Ground floor apartment. Smoke alarms present, no grab bars.     Has a shih-tzu, named Missy.     Patient eats a variety of foods. Drinks tea, Kool-aid, coffee         Use public transportation or brother will take places.    Some food insecurity.         Social Determinants of Health        Financial Resource Strain: Low Risk  (02/10/2023)    Overall Financial Resource Strain (CARDIA)     Difficulty of Paying Living Expenses: Not hard at all  Food Insecurity: No Food Insecurity (02/10/2023)    Hunger Vital Sign     Worried About Running Out of Food in the Last Year: Never true     Ran Out of Food in the Last Year: Never true  Transportation Needs: Unmet Transportation Needs (02/10/2023)    PRAPARE - Therapist, art (Medical): Yes     Lack of Transportation (Non-Medical): No  Physical Activity: Insufficiently Active (02/10/2023)    Exercise Vital Sign     Days of Exercise per Week: 7 days     Minutes of Exercise per Session: 10 min  Stress: No Stress Concern Present  (02/10/2023)    [employer] of Occupational Health - Occupational Stress Questionnaire     Feeling of Stress : Not at all  Social Connections: Moderately Isolated (02/10/2023)    Social Connection and Isolation Panel [NHANES]     Frequency of Communication with Friends and Family: More than three times a week     Frequency of Social Gatherings with Friends and Family: Never     Attends Religious Services: Never     Database administrator or Organizations: No     Attends Banker Meetings: Never     Marital Status: Living with partner  Intimate Partner Violence: Not At Risk (02/10/2023)    Humiliation, Afraid, Rape, and Kick questionnaire     Fear of Current or Ex-Partner: No     Emotionally Abused: No     Physically Abused: No     Sexually Abused: No           Family History  Problem Relation Age of Onset   Cancer Mother          colon Cancer   Hyperlipidemia Mother     Diabetes Mother     Heart disease Father     Hypertension Father     Cancer Sister          Uterine   Diabetes Sister     Diabetes Brother                  Current Outpatient Medications  Medication Sig Dispense Refill   albuterol (VENTOLIN HFA) 108 (90 Base) MCG/ACT inhaler  TAKE 2 PUFFS BY MOUTH EVERY 6 HOURS AS NEEDED FOR WHEEZE OR SHORTNESS OF BREATH 8.5 each 1   amLODipine (NORVASC) 5 MG tablet Take 1 tablet (5 mg total) by mouth at bedtime. 90 tablet 3   amoxicillin-clavulanate (AUGMENTIN) 875-125 MG tablet Take 1 tablet by mouth 2 (two) times daily. 20 tablet 0   atorvastatin (LIPITOR) 40 MG tablet TAKE 1 TABLET BY MOUTH EVERY DAY 90 tablet 1   baclofen (LIORESAL) 10 MG tablet Take 0.5 tablets (5 mg total) by mouth 2 (two) times daily as needed for muscle spasms. Make appointment before next refill. 30 tablet 0   benazepril (LOTENSIN) 5 MG tablet TAKE 1 TABLET BY MOUTH EVERY DAY 90 tablet 3   clopidogrel (PLAVIX) 75 MG tablet TAKE 1 TABLET BY MOUTH EVERY DAY 90 tablet 0   diclofenac  Sodium (VOLTAREN) 1 % GEL Apply 2 g topically 4 (four) times daily. 50 g 1   ezetimibe (ZETIA) 10 MG tablet TAKE 1 TABLET BY MOUTH EVERY DAY 90 tablet 1   ferrous sulfate 325 (65 FE) MG EC tablet Take 1 tablet (325 mg total) by mouth daily with breakfast. (Patient not taking: Reported on 02/10/2023) 90 tablet 1   FLUoxetine (PROZAC) 10 MG capsule TAKE 1 CAPSULE BY MOUTH EVERY DAY 90 capsule 3   gabapentin (NEURONTIN) 100 MG capsule Take 1 capsule (100 mg total) by mouth 3 (three) times daily. 90 capsule 3   gabapentin (NEURONTIN) 300 MG capsule TAKE 1 CAPSULE BY MOUTH THREE TIMES A DAY AS NEEDED 90 capsule 1   metFORMIN (GLUCOPHAGE) 1000 MG tablet TAKE 1 TABLET BY MOUTH TWICE A DAY WITH FOOD 180 tablet 1   Nebivolol HCl 20 MG TABS TAKE 1 TABLET BY MOUTH EVERY DAY 90 tablet 3   nicotine polacrilex (NICORETTE) 2 MG gum Take 1 each (2 mg total) by mouth as needed for smoking cessation. 100 tablet 3   oxyCODONE-acetaminophen (PERCOCET) 5-325 MG tablet Take 1 tablet by mouth every 4 (four) hours as needed for severe pain. 30 tablet 0   pantoprazole (PROTONIX) 40 MG tablet TAKE 1 TABLET BY MOUTH EVERY DAY AS NEEDED 90 tablet 1   polyethylene glycol powder (GLYCOLAX/MIRALAX) 17 GM/SCOOP powder Take 17 g by mouth daily. 500 g 0      No current facility-administered medications for this visit.        Allergies       Allergies  Allergen Reactions   Glipizide Other (See Comments)      REACTION IS SIDE EFFECT Severe hypoglycemia to 40s.           REVIEW OF SYSTEMS:  [X]  denotes positive finding, [ ]  denotes negative finding Cardiac   Comments:  Chest pain or chest pressure:      Shortness of breath upon exertion:      Short of breath when lying flat:      Irregular heart rhythm:             Vascular      Pain in calf, thigh, or hip brought on by ambulation:      Pain in feet at night that wakes you up from your sleep:       Blood clot in your veins:      Leg swelling:               Pulmonary      Oxygen at home:      Productive cough:  Wheezing:              Neurologic      Sudden weakness in arms or legs:       Sudden numbness in arms or legs:       Sudden onset of difficulty speaking or slurred speech:      Temporary loss of vision in one eye:       Problems with dizziness:              Gastrointestinal      Blood in stool:       Vomited blood:              Genitourinary      Burning when urinating:       Blood in urine:             Psychiatric      Major depression:              Hematologic      Bleeding problems:      Problems with blood clotting too easily:             Skin      Rashes or ulcers:             Constitutional      Fever or chills:          PHYSICAL EXAMINATION:      Vitals:    07/25/23 0940  BP: 106/70  Pulse: 85  Resp: 18  Temp: 98.5 F (36.9 C)  TempSrc: Temporal  SpO2: 92%  Weight: 190 lb (86.2 kg)  Height: 5\' 7"  (1.702 m)      General:  WDWN in NAD; vital signs documented above Gait: Not observed, in wheel chair HENT: WNL, normocephalic Pulmonary: normal non-labored breathing without  wheezing Cardiac: regular HR Abdomen: soft, NT Vascular Exam/Pulses: 1+ femoral pulses bilaterally, Faint monophasic right AT and PT signals Extremities: with ischemic changes to right foot, without Gangrene , without cellulitis; with open wound on dorsum of 3nd toe, and subungual ulcer of 2nd toe. Motor and sensation intact  Musculoskeletal: no muscle wasting or atrophy       Neurologic: A&O X 3 Psychiatric:  The pt has Normal affect.     Non-Invasive Vascular Imaging:   ABI Findings:  +---------+------------------+-----+----------+--------+  Right   Rt Pressure (mmHg)IndexWaveform  Comment   +---------+------------------+-----+----------+--------+  Brachial 85                                         +---------+------------------+-----+----------+--------+  PTA     48                0.56  monophasic          +---------+------------------+-----+----------+--------+  DP      43                0.51 monophasic          +---------+------------------+-----+----------+--------+  Great Toe                       Absent              +---------+------------------+-----+----------+--------+   +--------+------------------+-----+--------+-------+  Left   Lt Pressure (mmHg)IndexWaveformComment  +--------+------------------+-----+--------+-------+  Brachial71                                     +--------+------------------+-----+--------+-------+  Right ABIs appear decreased compared to prior study on 01/24/2023.    VAS US Aorta/Iliac/IVC duplex: Right Stent(s):  +---------------+--------+---------------+----------+--------+  EIA           PSV cm/sStenosis       Waveform  Comments  +---------------+--------+---------------+----------+--------+  Prox to Stent  338     50-99% stenosismonophasic          +---------------+--------+---------------+----------+--------+  Proximal Stent 129                    monophasic          +---------------+--------+---------------+----------+--------+  Mid Stent      72                     monophasic          +---------------+--------+---------------+----------+--------+  Distal Stent   103                    monophasic          +---------------+--------+---------------+----------+--------+  Distal to Stent76                     monophasic          +---------------+--------+---------------+----------+--------+   Summary:  Stenosis: +--------------------+-------------+--------------+  Location            Stenosis     Stent           +--------------------+-------------+--------------+  Right External Iliac>50% stenosis1-49% stenosis  +--------------------+-------------+--------------+      ASSESSMENT/PLAN:: 72 y.o. male here for follow up for PAD. He presents with 3 week history  of ischemic rest pain and tissue loss. He was seen by Podiatrist who recommended urgent follow up with [place]. Duplex today shows increased velocities in the proximal right EIA stent suggesting > 50% with PSV of 338 cm/s - ABI also decreased from prior study 6 months ago - Has known SFA occlusion with signal vessel peroneal runoff - He knows he is at high risk of needing more proximal amputation - Continue Statin and Plavix - The patient is on best medical therapy for peripheral arterial disease. The patient has been counseled about the risks of tobacco use in atherosclerotic disease. The patient has been counseled to abstain from any tobacco use - Recommend Angiography of RLE but he understands that this may be diagnostic in nature and he may require further surgical intervention - An aortogram with bilateral lower extremity runoff angiography and Right lower extremity intervention and is indicated to better evaluate the patient's lower extremity circulation because of the limb threatening nature of the patient's diagnosis. Based on the patient's clinical exam and non-invasive data, we anticipate an endovascular intervention in the iliac and femoropopliteal vessels. Stenting and/or athrectomy would be favored because of the improved primary patency of these interventions as compared to plain balloon angioplasty - Will arrange Angiogram, Arteriogram with Dr. Chestine Spore on 07/27/23      Graceann Congress, PA-C [facility] [phone]   Clinic MD:   Steve Rattler

## 2023-08-09 NOTE — Anesthesia Procedure Notes (Signed)
Arterial Line Insertion Start/End8/14/2024 8:51 AM, 08/09/2023 8:56 AM Performed by: Mal Amabile, MD, anesthesiologist  Patient location: OR. Preanesthetic checklist: patient identified, IV checked, site marked, risks and benefits discussed, surgical consent, monitors and equipment checked, pre-op evaluation, timeout performed and anesthesia consent Left, brachial was placed Catheter size: 20 G Hand hygiene performed  and maximum sterile barriers used   Attempts: 1 Procedure performed without using ultrasound guided technique. Following insertion, dressing applied and Biopatch. Patient tolerated the procedure well with no immediate complications.

## 2023-08-09 NOTE — Anesthesia Postprocedure Evaluation (Signed)
Anesthesia Post Note  Patient: SHEFFIELD MIGLIORE  Procedure(s) Performed: RIGHT  FEMORAL  ARTERY TO PERONEAL ARTERY BYPASS GRAFT USING GREATER SAPHENOUS VEIN (Right: Leg Upper) RIGHT GREATER SAPHENOUS VEIN HARVEST (Right: Leg Upper) REDO RIGHT GROIN EXPOSURE GREATER THAN 30 DAYS (Right: Groin)     Patient location during evaluation: PACU Anesthesia Type: General Level of consciousness: awake and alert Pain management: pain level controlled Vital Signs Assessment: post-procedure vital signs reviewed and stable Respiratory status: spontaneous breathing, nonlabored ventilation, respiratory function stable and patient connected to nasal cannula oxygen Cardiovascular status: blood pressure returned to baseline and stable Postop Assessment: no apparent nausea or vomiting Anesthetic complications: no   No notable events documented.  Last Vitals:  Vitals:   08/09/23 0727  BP: 95/73  Pulse: 77  Resp: (!) 22  Temp: 36.6 C  SpO2: 97%    Last Pain:  Vitals:   08/09/23 0727  TempSrc: Oral  PainSc:                  , A.

## 2023-08-10 ENCOUNTER — Encounter (HOSPITAL_COMMUNITY): Payer: Self-pay | Admitting: Vascular Surgery

## 2023-08-10 LAB — LIPID PANEL
Cholesterol: 48 mg/dL (ref 0–200)
HDL: 19 mg/dL — ABNORMAL LOW (ref >40)
LDL Cholesterol: 14 mg/dL (ref 0–99)
Total CHOL/HDL Ratio: 2.5 ratio
Triglycerides: 74 mg/dL (ref <150)
VLDL: 15 mg/dL (ref 0–40)

## 2023-08-10 LAB — GLUCOSE, CAPILLARY
Glucose-Capillary: 164 mg/dL — ABNORMAL HIGH (ref 70–99)
Glucose-Capillary: 118 mg/dL — ABNORMAL HIGH (ref 70–99)
Glucose-Capillary: 124 mg/dL — ABNORMAL HIGH (ref 70–99)
Glucose-Capillary: 151 mg/dL — ABNORMAL HIGH (ref 70–99)

## 2023-08-10 LAB — PREPARE RBC (CROSSMATCH)

## 2023-08-10 MED ORDER — LACTATED RINGERS IV SOLN: INTRAVENOUS | Status: DC

## 2023-08-10 MED ORDER — SODIUM CHLORIDE 0.9% IV SOLUTION: Freq: Once | INTRAVENOUS | Status: AC

## 2023-08-10 NOTE — Discharge Instructions (Signed)

## 2023-08-10 NOTE — Progress Notes (Signed)
? ?  Inpatient Rehab Admissions Coordinator : ? ?Per therapy recommendations, patient was screened for CIR candidacy by Barbara Boyette RN MSN.  At this time patient appears to be a potential candidate for CIR. I will place a rehab consult per protocol for full assessment. Please call me with any questions. ? ?Barbara Boyette RN MSN ?Admissions Coordinator ?[phone] ?  ?

## 2023-08-10 NOTE — Evaluation (Signed)
Physical Therapy Evaluation Patient Details Name: [name] MRN: [id number] DOB: 1951-12-15 Today's Date: 08/10/2023  History of Present Illness  Patient is a 72 y/o male admitted 08/09/23 for R common femoral artery to peroneal bypass with ipsilateral reversed great saphenous vein. 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 ACDF, back surgery, DM, GERD, HLD, HTN, PAD, tobacco use and shoulder rotator cuff tear B per pt.  Clinical Impression  Patient presents with decreased mobility due to pain in R LE, decreased balance, decreased activity tolerance, decreased ROM and strength R LE.  Previously able to pivot on R LE for transfers to w/c , toilet, bed, car and shower seat.  Patient today limited by pain and ROM in R LE needing +2 mod A for safety to drop arm recliner.  Patient mobilizing in the home via power scooter.  Feel he may benefit from intensive inpatient rehab prior to d/c home depending on progress.  PT will continue to follow.         If plan is discharge home, recommend the following: A lot of help with walking and/or transfers;A lot of help with bathing/dressing/bathroom;Assistance with cooking/housework;Assist for transportation   Can travel by private vehicle        Equipment Recommendations None recommended by PT  Recommendations for Other Services  Rehab consult    Functional Status Assessment Patient has had a recent decline in their functional status and demonstrates the ability to make significant improvements in function in a reasonable and predictable amount of time.     Precautions / Restrictions Precautions Precautions: Fall Precaution Comments: L AKA Restrictions Weight Bearing Restrictions: No      Mobility  Bed Mobility Overal bed mobility: Needs Assistance Bed Mobility: Supine to Sit     Supine to sit: Min assist, HOB elevated, Used rails     General bed mobility comments: increased  time, but moving somewhat on his own, assist to scoot hips forward on bed pad and some CGA for balance    Transfers Overall transfer level: Needs assistance   Transfers: Bed to chair/wheelchair/BSC       Squat pivot transfers: Mod assist, +2 physical assistance     General transfer comment: attempted with +1 A but pt unable due to pain in R LE; assisted for scoot/squat pivot to recliner with +2 using bed pad under to aide in transition to drop arm recliner    Ambulation/Gait                  Stairs            Wheelchair Mobility     Tilt Bed    Modified Rankin (Stroke Patients Only)       Balance Overall balance assessment: Needs assistance Sitting-balance support: Feet unsupported Sitting balance-Leahy Scale: Fair Sitting balance - Comments: on EOB balancing without UE support and R foot not on floor initially       Standing balance comment: did not stand today                             Pertinent Vitals/Pain Pain Assessment Pain Assessment: 0-10 Pain Score: 8  Pain Location: R LE Pain Descriptors / Indicators: Burning, Aching, Discomfort Pain Intervention(s): Monitored during session, RN gave pain meds during session    Home Living Family/patient expects to be discharged to:: Private residence Living Arrangements: Spouse/significant other Available Help at Discharge: Family;Available  24 hours/day Type of Home: House Home Access: Ramped entrance       Home Layout: One level Home Equipment: Tub bench;Electric scooter;Wheelchair - Forensic psychologist (2 wheels);BSC/3in1      Prior Function Prior Level of Function : Independent/Modified Independent             Mobility Comments: using wheelchair in the home due to R leg giving out when attempting walking with prosthetic. ADLs Comments: mod I with showering with tub bench     Extremity/Trunk Assessment   Upper Extremity Assessment Upper Extremity Assessment: Defer to  OT evaluation    Lower Extremity Assessment Lower Extremity Assessment: RLE deficits/detail;LLE deficits/detail RLE Deficits / Details: ankle AROM WFL, incision down medial aspect of leg and thigh and in groin and painful with flexion though able to flex to approx 70 in sitting       Communication   Communication Communication: No apparent difficulties  Cognition Arousal: Alert Behavior During Therapy: WFL for tasks assessed/performed Overall Cognitive Status: Within Functional Limits for tasks assessed                                          General Comments General comments (skin integrity, edema, etc.): on 2L O2 with SpO2 probe on pt's forehead with intermittent poor pleth and readings into 70's though back to 90's when settled and waveform more consistent; BP 80's/50's on EOB and once in chair 70's/40's with RN aware and reports due to PVD MD not concerned unless mentation change.  Pt denies dizziness    Exercises     Assessment/Plan    PT Assessment Patient needs continued PT services  PT Problem List Decreased balance;Decreased activity tolerance;Decreased mobility;Decreased range of motion;Pain       PT Treatment Interventions DME instruction;Functional mobility training;Balance training;Patient/family education;Therapeutic exercise;Therapeutic activities    PT Goals (Current goals can be found in the Care Plan section)  Acute Rehab PT Goals Patient Stated Goal: to return to independent PT Goal Formulation: With patient Time For Goal Achievement: 08/24/23 Potential to Achieve Goals: Good    Frequency Min 1X/week     Co-evaluation PT/OT/SLP Co-Evaluation/Treatment: Yes Reason for Co-Treatment: For patient/therapist safety;To address functional/ADL transfers PT goals addressed during session: Mobility/safety with mobility;Balance         AM-PAC PT "6 Clicks" Mobility  Outcome Measure Help needed turning from your back to your side while in  a flat bed without using bedrails?: A Lot Help needed moving from lying on your back to sitting on the side of a flat bed without using bedrails?: A Lot Help needed moving to and from a bed to a chair (including a wheelchair)?: Total Help needed standing up from a chair using your arms (e.g., wheelchair or bedside chair)?: Total Help needed to walk in hospital room?: Total Help needed climbing 3-5 steps with a railing? : Total 6 Click Score: 8    End of Session Equipment Utilized During Treatment: Gait belt;Oxygen Activity Tolerance: Patient limited by pain Patient left: in chair;with call bell/phone within reach;with chair alarm set   PT Visit Diagnosis: Other abnormalities of gait and mobility (R26.89);Muscle weakness (generalized) (M62.81);Pain Pain - Right/Left: Right Pain - part of body: Leg    Time: 1050-1115 PT Time Calculation (min) (ACUTE ONLY): 25 min   Charges:   PT Evaluation $PT Eval Moderate Complexity: 1 Mod   PT General Charges $$  ACUTE PT VISIT: 1 Visit         Sheran Lawless, PT Acute Rehabilitation Services Office:[phone] 08/10/2023   Elray Mcgregor 08/10/2023, 11:35 AM

## 2023-08-10 NOTE — Plan of Care (Signed)
  Problem: Education: Goal: Understanding of CV disease, CV risk reduction, and recovery process will improve Outcome: Progressing   Problem: Cardiovascular: Goal: Vascular access site(s) Level 0-1 will be maintained Outcome: Progressing   Problem: Metabolic: Goal: Ability to maintain appropriate glucose levels will improve Outcome: Progressing   Problem: Nutritional: Goal: Maintenance of adequate nutrition will improve Outcome: Progressing   Problem: Tissue Perfusion: Goal: Adequacy of tissue perfusion will improve Outcome: Progressing   Problem: Education: Goal: Knowledge of prescribed regimen will improve Outcome: Progressing   Problem: Activity: Goal: Ability to tolerate increased activity will improve Outcome: Progressing   Problem: Clinical Measurements: Goal: Postoperative complications will be avoided or minimized Outcome: Progressing Goal: Signs and symptoms of graft occlusion will improve Outcome: Progressing   Problem: Skin Integrity: Goal: Demonstration of wound healing without infection will improve Outcome: Progressing   Problem: Clinical Measurements: Goal: Will remain free from infection Outcome: Progressing Goal: Diagnostic test results will improve Outcome: Progressing Goal: Respiratory complications will improve Outcome: Progressing Goal: Cardiovascular complication will be avoided Outcome: Progressing

## 2023-08-10 NOTE — Evaluation (Signed)
Occupational Therapy Evaluation Patient Details Name: [name] MRN: [id number] DOB: 1951/12/12 Today's Date: 08/10/2023   History of Present Illness Patient is a 72 y/o male admitted 08/09/23 for R common femoral artery to peroneal bypass with ipsilateral reversed great saphenous vein. 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 ACDF, back surgery, DM, GERD, HLD, HTN, PAD, tobacco use and shoulder rotator cuff tear B per pt.   Clinical Impression   Patient admitted for the diagnosis above.  PTA he lives at home with his spouse, and remains Mod I with bathing and dressing at scooter/seated position.  At home, he was able to stand with his R leg for stand pivot transfers, but is unable currently due to pain post op to his R leg.  Patient is currently needing Mod A +2 for basic slide transfer.  OT is indicated in the acute setting, and Patient will benefit from intensive inpatient follow up therapy, >3 hours/day.  Patient has the ability to participate with 3+ hours of rehab, and demonstrates the ability to reach Mod I level.          If plan is discharge home, recommend the following: A lot of help with bathing/dressing/bathroom;Two people to help with walking and/or transfers;Help with stairs or ramp for entrance;Assist for transportation;Assistance with cooking/housework    Functional Status Assessment  Patient has had a recent decline in their functional status and demonstrates the ability to make significant improvements in function in a reasonable and predictable amount of time.  Equipment Recommendations  None recommended by OT    Recommendations for Other Services       Precautions / Restrictions Precautions Precautions: Fall Precaution Comments: L AKA Restrictions Weight Bearing Restrictions: No      Mobility Bed Mobility Overal bed mobility: Needs Assistance Bed Mobility: Supine to Sit     Supine to sit:  Min assist, HOB elevated, Used rails       Patient Response: Cooperative  Transfers Overall transfer level: Needs assistance   Transfers: Bed to chair/wheelchair/BSC     Squat pivot transfers: Mod assist, +2 physical assistance              Balance Overall balance assessment: Needs assistance Sitting-balance support: Feet unsupported Sitting balance-Leahy Scale: Fair                                     ADL either performed or assessed with clinical judgement   ADL Overall ADL's : Needs assistance/impaired Eating/Feeding: Independent;Sitting   Grooming: Set up;Sitting   Upper Body Bathing: Set up;Sitting   Lower Body Bathing: Moderate assistance;Sitting/lateral leans   Upper Body Dressing : Set up;Sitting   Lower Body Dressing: Moderate assistance;Sitting/lateral leans   Toilet Transfer: Moderate assistance;+2 for physical assistance                   Vision Patient Visual Report: No change from baseline       Perception Perception: Within Functional Limits       Praxis Praxis: WFL       Pertinent Vitals/Pain Pain Assessment Pain Assessment: Faces Faces Pain Scale: Hurts even more Pain Location: R LE Pain Descriptors / Indicators: Burning, Aching, Discomfort Pain Intervention(s): Monitored during session     Extremity/Trunk Assessment Upper Extremity Assessment Upper Extremity Assessment: Right hand dominant;RUE deficits/detail;LUE deficits/detail;Generalized weakness RUE Deficits / Details: RCT but able to  reach the top of his head RUE Sensation: WNL RUE Coordination: WNL LUE Deficits / Details: RCT but able to reach the top of his head, better AROM with the L LUE Sensation: WNL LUE Coordination: WNL   Lower Extremity Assessment Lower Extremity Assessment: Defer to PT evaluation RLE Deficits / Details: ankle AROM WFL, incision down medial aspect of leg and thigh and in groin and painful with flexion though able to flex  to approx 70 in sitting   Cervical / Trunk Assessment Cervical / Trunk Assessment: Kyphotic   Communication Communication Communication: No apparent difficulties   Cognition Arousal: Alert Behavior During Therapy: WFL for tasks assessed/performed Overall Cognitive Status: Within Functional Limits for tasks assessed                                       General Comments  VSS    Exercises     Shoulder Instructions      Home Living Family/patient expects to be discharged to:: Private residence Living Arrangements: Spouse/significant other Available Help at Discharge: Family;Available 24 hours/day Type of Home: House Home Access: Ramped entrance     Home Layout: One level     Bathroom Shower/Tub: Chief Strategy Officer: Standard Bathroom Accessibility: Yes How Accessible: Accessible via walker;Accessible via wheelchair Home Equipment: Tub bench;Electric scooter;Wheelchair - Forensic psychologist (2 wheels);BSC/3in1          Prior Functioning/Environment Prior Level of Function : Independent/Modified Independent             Mobility Comments: using wheelchair in the home due to R leg giving out when attempting walking with prosthetic. ADLs Comments: mod I with showering with tub bench        OT Problem List: Decreased strength;Decreased range of motion;Decreased activity tolerance;Impaired balance (sitting and/or standing);Decreased safety awareness;Pain;Increased edema      OT Treatment/Interventions: Self-care/ADL training;Therapeutic activities;Balance training;DME and/or AE instruction;Patient/family education    OT Goals(Current goals can be found in the care plan section) Acute Rehab OT Goals Patient Stated Goal: Return hoem Mod I OT Goal Formulation: With patient Time For Goal Achievement: 08/24/23 Potential to Achieve Goals: Good ADL Goals Pt Will Perform Lower Body Bathing: sitting/lateral leans;with min assist Pt Will  Perform Lower Body Dressing: sitting/lateral leans;with min assist;with adaptive equipment Pt Will Transfer to Toilet: with min assist;with transfer board;bedside commode  OT Frequency: Min 1X/week    Co-evaluation PT/OT/SLP Co-Evaluation/Treatment: Yes Reason for Co-Treatment: For patient/therapist safety;To address functional/ADL transfers PT goals addressed during session: Mobility/safety with mobility;Balance OT goals addressed during session: ADL's and self-care      AM-PAC OT "6 Clicks" Daily Activity     Outcome Measure Help from another person eating meals?: None Help from another person taking care of personal grooming?: None Help from another person toileting, which includes using toliet, bedpan, or urinal?: A Lot Help from another person bathing (including washing, rinsing, drying)?: A Lot Help from another person to put on and taking off regular upper body clothing?: None Help from another person to put on and taking off regular lower body clothing?: A Lot 6 Click Score: 18   End of Session Equipment Utilized During Treatment: Gait belt Nurse Communication: Mobility status  Activity Tolerance: Patient tolerated treatment well Patient left: in chair;with call bell/phone within reach  OT Visit Diagnosis: Unsteadiness on feet (R26.81);Muscle weakness (generalized) (M62.81);Pain Pain - Right/Left: Right Pain - part of  body: Leg                Time: 9147-8295 OT Time Calculation (min): 16 min Charges:  OT General Charges $OT Visit: 1 Visit OT Evaluation $OT Eval Moderate Complexity: 1 Mod  08/10/2023  RP, OTR/L  Acute Rehabilitation Services  Office:  [phone]   Suzanna Obey 08/10/2023, 12:45 PM

## 2023-08-10 NOTE — Progress Notes (Addendum)
Progress Note    08/10/2023 6:50 AM 1 Day Post-Op  Subjective:  no complaints;  says he can tell a difference in his foot.    Afebrile HR 70's-80's NSR 80's-110's systolic 92% 2LO2NC  Gtts:  none  Vitals:   08/10/23 0600 08/10/23 0630  BP: 90/62 (!) 88/73  Pulse: 85 86  Resp: 17 15  Temp:    SpO2: 91% 92%    Physical Exam: General:  no distress; sitting up in bed Cardiac:  regular Lungs:  non labored Incisions:  right groin and right leg incisions all look good Extremities:  right foot is warm with + right peroneal doppler signal.  Anterior compartment is soft and non tender.  Calf is soft and slightly tender.    Abdomen:  soft  CBC    Component Value Date/Time   WBC 8.9 08/09/2023 2102   RBC 4.09 (L) 08/09/2023 2102   HGB 7.8 (L) 08/09/2023 2102   HGB 9.9 (L) 02/23/2023 1210   HCT 27.7 (L) 08/09/2023 2102   HCT 34.6 (L) 02/23/2023 1210   PLT 338 08/09/2023 2102   PLT 362 02/23/2023 1210   MCV 67.7 (L) 08/09/2023 2102   MCV 67 (L) 02/23/2023 1210   MCH 19.1 (L) 08/09/2023 2102   MCHC 28.2 (L) 08/09/2023 2102   RDW 25.3 (H) 08/09/2023 2102   RDW 18.3 (H) 02/23/2023 1210   LYMPHSABS 1.7 03/03/2022 1615   MONOABS 1.1 (H) 02/20/2021 0150   EOSABS 0.2 03/03/2022 1615   BASOSABS 0.1 03/03/2022 1615    BMET    Component Value Date/Time   NA 133 (L) 08/09/2023 1500   NA 136 02/23/2023 1210   K 3.9 08/09/2023 1500   CL 98 08/09/2023 1500   CO2 25 08/09/2023 1500   GLUCOSE 150 (H) 08/09/2023 1500   BUN 10 08/09/2023 1500   BUN 8 02/23/2023 1210   CREATININE 0.62 08/09/2023 1500   CREATININE 0.94 03/31/2015 1454   CALCIUM 9.0 08/09/2023 1500   GFRNONAA >60 08/09/2023 1500   GFRAA 106 11/30/2020 0944    INR    Component Value Date/Time   INR 1.2 08/09/2023 0700     Intake/Output Summary (Last 24 hours) at 08/10/2023 0650 Last data filed at 08/10/2023 0600 Gross per 24 hour  Intake 6681.3 ml  Output 1135 ml  Net 5546.3 ml       Assessment/Plan:  72 y.o. male is s/p:  Redo exposure of right CFA, right CFA to peroneal artery bypass with ipsilateral reversed GSV on 08/09/2023 by Dr. Chestine Spore for CLI with tissue loss  1 Day Post-Op   -pt with + peroneal doppler signal.  His right foot is warm and well perfused.  His calf and anterior compartments are very soft.  Slightly tender in calf to palpation but feel this is from surgery and not compartment syndrome given how soft his calf is.   -acute surgical blood loss anemia-hgb 7.8 this morning.  He did receive 3 units PRBC's yesterday.   EF on echo in 2023 was 55%.  Given soft pressures, will transfuse one unit today.   Check CBC tomorrow -DVT prophylaxis:  sq heparin -continue asa/statin/plavix   Doreatha Massed, PA-C [facility] [phone] 08/10/2023 6:50 AM  I have seen and evaluated the patient. I agree with the PA note as documented above.  Postop day 1 status post right common femoral to peroneal bypass with ipsilateral reverse great saphenous vein for CLI with tissue loss.  He has a palpable  pulse in the bypass graft tunneled in the saphenectomy incision with a brisk peroneal signal at the foot.  Will have to monitor his tissue loss.  He is followed by podiatry.  Hemoglobin today 8.8--> 7.8.  Will get 1 unit PRBCs.  No evidence of bleeding all of his incisions look great.  Remove Foley catheter.  PT OT.  Transfer to the floor.  Aspirin Plavix restarted with statin.  Blood pressures have been difficult to monitor even IntraOp yesterday and using a cuff pressure on his right arm and MAPs>65.  Mentating fine.  Cephus Shelling, MD [facility] [place] Office: [phone]

## 2023-08-10 NOTE — TOC Initial Note (Signed)
Transition of Care [facility]) - Initial/Assessment Note    Patient Details  Name: [name] MRN: [id number] Date of Birth: November 03, 1951  Transition of Care [facility]) CM/SW Contact:    Elliot Cousin, RN Phone Number: [phone] 08/10/2023, 2:49 PM  Clinical Narrative:   CM spoke to pt and states he has RW and wheelchair at home. Pt's HH preoperatively with [facility] prior to surgery. Contacted [facility] rep, Morrie Sheldon with referral. Will continue to follow for dc needs. `                Expected Discharge Plan: Home w Home Health Services Barriers to Discharge: Continued Medical Work up   Patient Goals and CMS Choice Patient states their goals for this hospitalization and ongoing recovery are:: wants to get better CMS Medicare.gov Compare Post Acute Care list provided to:: Patient Choice offered to / list presented to : Patient      Expected Discharge Plan and Services   Discharge Planning Services: CM Consult Post Acute Care Choice: Home Health Living arrangements for the past 2 months: Single Family Home                           HH Arranged: PT, OT HH Agency: [facility] Date HH Agency Contacted: 08/10/23 Time HH Agency Contacted: [id number] Representative spoke with at [facility] Agency: Morrie Sheldon  Prior Living Arrangements/Services Living arrangements for the past 2 months: Single Family Home Lives with:: Spouse Patient language and need for interpreter reviewed:: Yes Do you feel safe going back to the place where you live?: Yes      Need for Family Participation in Patient Care: No (Comment) Care giver support system in place?: Yes (comment) Current home services: DME (rolling walker, wheelchair) Criminal Activity/Legal Involvement Pertinent to Current Situation/Hospitalization: No - Comment as needed  Activities of Daily Living Home Assistive Devices/Equipment: Wheelchair, Eyeglasses, CBG Meter, Blood pressure cuff, Bedside commode/3-in-1, Raised toilet  seat with rails, Shower chair with back ADL Screening (condition at time of admission) Patient's cognitive ability adequate to safely complete daily activities?: Yes Is the patient deaf or have difficulty hearing?: No Does the patient have difficulty seeing, even when wearing glasses/contacts?: No Does the patient have difficulty concentrating, remembering, or making decisions?: No Patient able to express need for assistance with ADLs?: Yes Does the patient have difficulty dressing or bathing?: No Independently performs ADLs?: No Communication: Independent Dressing (OT): Independent Grooming: Independent Feeding: Independent Bathing: Independent Toileting: Independent In/Out Bed: Needs assistance Is this a change from baseline?: Pre-admission baseline Walks in Home: Needs assistance Is this a change from baseline?: Pre-admission baseline Does the patient have difficulty walking or climbing stairs?: Yes Weakness of Legs: Both Weakness of Arms/Hands: Both  Permission Sought/Granted Permission sought to share information with : Case Manager, Family Supports, PCP Permission granted to share information with : Yes, Verbal Permission Granted  Share Information with NAME: Horace Porteous  Permission granted to share info w AGENCY: Home Health  Permission granted to share info w Relationship: wife  Permission granted to share info w Contact Information: [phone]  Emotional Assessment Appearance:: Appears stated age Attitude/Demeanor/Rapport: Engaged Affect (typically observed): Accepting Orientation: : Oriented to Self, Oriented to Place, Oriented to  Time, Oriented to Situation   Psych Involvement: No (comment)  Admission diagnosis:  Critical limb ischemia of right lower extremity (HCC) [I70.221] PAD (peripheral artery disease) (HCC) [I73.9] Patient Active Problem List   Diagnosis Date Noted  Critical limb ischemia of right lower extremity (HCC) 08/09/2023   Bilateral  shoulder pain 06/14/2023   Fatty liver 03/30/2021   Imaging of gastrointestinal tract abnormal 03/30/2021   Personal history of colonic polyps 03/30/2021   S/P above knee amputation, left (HCC) 02/19/2021   Lung nodule < 6cm on CT 01/28/2019   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) 01/18/2019   Depression 10/03/2016   Diabetic neuropathy (HCC) 02/27/2014   Iron deficiency anemia 08/01/2012   Tobacco use disorder 02/22/2007   Primary hypertension 02/22/2007   PAD (peripheral artery disease) (HCC) 02/22/2007   PCP:  Erick Alley, DO Pharmacy:   [facility] [phone] - [place], [place] - [address] [place] [address] Phone: [phone] Fax: [phone]     Social Determinants of Health (SDOH) Social History: SDOH Screenings   Food Insecurity: No Food Insecurity (08/09/2023)  Housing: Low Risk  (08/09/2023)  Transportation Needs: Unmet Transportation Needs (08/09/2023)  Utilities: At Risk (08/09/2023)  Alcohol Screen: Low Risk  (02/10/2023)  Depression (PHQ2-9): Low Risk  (06/14/2023)  Financial Resource Strain: Low Risk  (02/10/2023)  Physical Activity: Insufficiently Active (02/10/2023)  Social Connections: Moderately Isolated (02/10/2023)  Stress: No Stress Concern Present (02/10/2023)  Tobacco Use: High Risk (08/09/2023)   SDOH Interventions:     Readmission Risk Interventions    02/22/2021   12:24 PM  Readmission Risk Prevention Plan  Transportation Screening Complete  PCP or Specialist Appt within 5-7 Days Complete  Home Care Screening Complete  Medication Review (RN CM) Complete

## 2023-08-11 LAB — BPAM RBC
Blood Product Expiration Date: 202408192359
Blood Product Expiration Date: 202408202359
Blood Product Expiration Date: 202408202359
Blood Product Expiration Date: 202408272359
ISSUE DATE / TIME: 202408140920
ISSUE DATE / TIME: 202408140920
ISSUE DATE / TIME: 202408141102
ISSUE DATE / TIME: 202408150751
Unit Type and Rh: 600
Unit Type and Rh: 600
Unit Type and Rh: 6200
Unit Type and Rh: 6200

## 2023-08-11 LAB — TYPE AND SCREEN
ABO/RH(D): A POS
Antibody Screen: NEGATIVE
Unit division: 0
Unit division: 0
Unit division: 0
Unit division: 0

## 2023-08-11 LAB — BASIC METABOLIC PANEL
Anion gap: 15 (ref 5–15)
BUN: 22 mg/dL (ref 8–23)
CO2: 24 mmol/L (ref 22–32)
Calcium: 8.4 mg/dL — ABNORMAL LOW (ref 8.9–10.3)
Chloride: 96 mmol/L — ABNORMAL LOW (ref 98–111)
Creatinine, Ser: 1.42 mg/dL — ABNORMAL HIGH (ref 0.61–1.24)
GFR, Estimated: 52 mL/min — ABNORMAL LOW (ref >60)
Glucose, Bld: 148 mg/dL — ABNORMAL HIGH (ref 70–99)
Potassium: 4.8 mmol/L (ref 3.5–5.1)
Sodium: 135 mmol/L (ref 135–145)

## 2023-08-11 LAB — CBC
HCT: 34.8 % — ABNORMAL LOW (ref 39.0–52.0)
Hemoglobin: 9.6 g/dL — ABNORMAL LOW (ref 13.0–17.0)
MCH: 19.7 pg — ABNORMAL LOW (ref 26.0–34.0)
MCHC: 27.6 g/dL — ABNORMAL LOW (ref 30.0–36.0)
MCV: 71.3 fL — ABNORMAL LOW (ref 80.0–100.0)
Platelets: 347 10*3/uL (ref 150–400)
RBC: 4.88 MIL/uL (ref 4.22–5.81)
RDW: 26.7 % — ABNORMAL HIGH (ref 11.5–15.5)
WBC: 16.2 10*3/uL — ABNORMAL HIGH (ref 4.0–10.5)
nRBC: 0.1 % (ref 0.0–0.2)

## 2023-08-11 LAB — GLUCOSE, CAPILLARY
Glucose-Capillary: 150 mg/dL — ABNORMAL HIGH (ref 70–99)
Glucose-Capillary: 137 mg/dL — ABNORMAL HIGH (ref 70–99)
Glucose-Capillary: 117 mg/dL — ABNORMAL HIGH (ref 70–99)
Glucose-Capillary: 111 mg/dL — ABNORMAL HIGH (ref 70–99)

## 2023-08-11 NOTE — Progress Notes (Signed)
Physical Therapy Treatment Patient Details Name: [name] MRN: [id number] DOB: [birth year] Today's Date: 08/11/2023   History of Present Illness Patient is a 72 y/o male admitted 08/09/23 for R common femoral artery to peroneal bypass with ipsilateral reversed great saphenous vein. 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 ACDF, back surgery, DM, GERD, HLD, HTN, PAD, tobacco use and shoulder rotator cuff tear B per pt.    PT Comments  Pt motivated and with gradual progress but still requiring assist of 2 for transfers.  Limited due to pain and L AKA for which prosthetic has not worked yet (had for ~3 months) .  Pt had recently returned to bed and did not want to sit in chair again but did agree to exercises, sitting EOB, and lateral scooting at EOB.  Noted pt concerned about finances for post acute rehab.  From PT perspective, Patient will benefit from intensive inpatient follow up therapy, >3 hours/day at d/c.    If plan is discharge home, recommend the following: A lot of help with walking and/or transfers;A lot of help with bathing/dressing/bathroom;Assistance with cooking/housework;Assist for transportation   Can travel by private vehicle        Equipment Recommendations  None recommended by PT    Recommendations for Other Services Rehab consult     Precautions / Restrictions Precautions Precautions: Fall Precaution Comments: L AKA     Mobility  Bed Mobility Overal bed mobility: Needs Assistance Bed Mobility: Supine to Sit, Sit to Supine     Supine to sit: Min assist, HOB elevated, Used rails Sit to supine: Mod assist   General bed mobility comments: With transition to EOB: increased time and cues but largely doing on his own with some assist to scoot forward.  Return to bed with mod A for LE.    Transfers Overall transfer level: Needs assistance                Lateral/Scoot Transfers: +2 physical  assistance, Mod assist General transfer comment: Pt had been in chair all morning with nursing but was agreeable to sitting EOB.  When at EOB worked on 2 lateral scoots toward [facility] with pt holding therapist arms, coming slightly up on R heel, and use of bed pad to scoot    Ambulation/Gait               General Gait Details: unable baseline   Stairs             Wheelchair Mobility     Tilt Bed    Modified Rankin (Stroke Patients Only)       Balance Overall balance assessment: Needs assistance Sitting-balance support: Feet supported Sitting balance-Leahy Scale: Fair Sitting balance - Comments: Sat EOB for 15 mins.  R foot on floor.  Able to do without UE support - did lean on L elbow at one point but to offload R LE.  Worked on reaching/turning but pt not able to go outside BOS.                       High Level Balance Comments: Sitting EOB: Reahcing forward and to side bil UE x 5, limited shoudler ROM so encoruaged trunk lean but pt not able to go outside BOS.  Trunk rotation to tolerance with assist x 5.            Cognition Arousal: Alert Behavior During Therapy: WFL for tasks assessed/performed Overall  Cognitive Status: Within Functional Limits for tasks assessed                                          Exercises Total Joint Exercises Knee Flexion: AROM, Right, 10 reps, Seated General Exercises - Lower Extremity Ankle Circles/Pumps: AROM, Right, 10 reps, Supine Quad Sets: AROM, Right, 10 reps, Supine Long Arc Quad: AROM, Right, 10 reps, Seated Heel Slides: AROM, Right, 10 reps, Supine    General Comments General comments (skin integrity, edema, etc.): Pt on 2 L O2 with VSS.  Pt has low BP with MD aware "Blood pressure has been difficult to monitor due to occlusive disease in both upper extremities and A-line's were not accurate during surgery.  Right upper arm cuff pressure most accurate. -" per MD notes.  RN reports MD  requesting just to monitor mentation.  During session BP 80's/60's and pt asymptomatic.      Pertinent Vitals/Pain Pain Assessment Pain Assessment: Faces Faces Pain Scale: Hurts even more Pain Location: R LE Pain Descriptors / Indicators: Burning, Discomfort, Throbbing Pain Intervention(s): Limited activity within patient's tolerance, Monitored during session, Repositioned (cautious hand placement)    Home Living                          Prior Function            PT Goals (current goals can now be found in the care plan section) Progress towards PT goals: Progressing toward goals    Frequency    Min 1X/week      PT Plan      Co-evaluation              AM-PAC PT "6 Clicks" Mobility   Outcome Measure  Help needed turning from your back to your side while in a flat bed without using bedrails?: A Lot Help needed moving from lying on your back to sitting on the side of a flat bed without using bedrails?: A Lot Help needed moving to and from a bed to a chair (including a wheelchair)?: Total Help needed standing up from a chair using your arms (e.g., wheelchair or bedside chair)?: Total Help needed to walk in hospital room?: Total Help needed climbing 3-5 steps with a railing? : Total 6 Click Score: 8    End of Session Equipment Utilized During Treatment: Gait belt;Oxygen Activity Tolerance: Patient limited by pain Patient left: with call bell/phone within reach;in bed;with bed alarm set Nurse Communication: Mobility status PT Visit Diagnosis: Other abnormalities of gait and mobility (R26.89);Muscle weakness (generalized) (M62.81);Pain Pain - Right/Left: Right Pain - part of body: Leg     Time: 4098-1191 PT Time Calculation (min) (ACUTE ONLY): 25 min  Charges:    $Therapeutic Exercise: 8-22 mins $Therapeutic Activity: 8-22 mins PT General Charges $$ ACUTE PT VISIT: 1 Visit                     Anise Salvo, PT Acute Rehab [facility]  [phone]    [name] 08/11/2023, 4:24 PM

## 2023-08-11 NOTE — Progress Notes (Addendum)
Progress Note    08/11/2023 7:47 AM 2 Days Post-Op  Subjective:  feeling fine this morning, having pain at his incision sites    Vitals:   08/11/23 0500 08/11/23 0600  BP: 90/77 (!) 81/70  Pulse: 88 89  Resp: 16 16  Temp:    SpO2: 90% 92%    Physical Exam: General:  no acute distress Cardiac:  regular, SBP stable in 80s-100s Lungs:  nonlabored, on nasal cannula Incisions:  right groin and lower extremity incisions c/d/l without hematoma Extremities:  brisk right peroneal doppler signal, palpable bypass graft pulse  CBC    Component Value Date/Time   WBC 16.2 (H) 08/11/2023 0242   RBC 4.88 08/11/2023 0242   HGB 9.6 (L) 08/11/2023 0242   HGB 9.9 (L) 02/23/2023 1210   HCT 34.8 (L) 08/11/2023 0242   HCT 34.6 (L) 02/23/2023 1210   PLT 347 08/11/2023 0242   PLT 362 02/23/2023 1210   MCV 71.3 (L) 08/11/2023 0242   MCV 67 (L) 02/23/2023 1210   MCH 19.7 (L) 08/11/2023 0242   MCHC 27.6 (L) 08/11/2023 0242   RDW 26.7 (H) 08/11/2023 0242   RDW 18.3 (H) 02/23/2023 1210   LYMPHSABS 1.7 03/03/2022 1615   MONOABS 1.1 (H) 02/20/2021 0150   EOSABS 0.2 03/03/2022 1615   BASOSABS 0.1 03/03/2022 1615    BMET    Component Value Date/Time   NA 135 08/11/2023 0242   NA 136 02/23/2023 1210   K 4.8 08/11/2023 0242   CL 96 (L) 08/11/2023 0242   CO2 24 08/11/2023 0242   GLUCOSE 148 (H) 08/11/2023 0242   BUN 22 08/11/2023 0242   BUN 8 02/23/2023 1210   CREATININE 1.42 (H) 08/11/2023 0242   CREATININE 0.94 03/31/2015 1454   CALCIUM 8.4 (L) 08/11/2023 0242   GFRNONAA 52 (L) 08/11/2023 0242   GFRAA 106 11/30/2020 0944    INR    Component Value Date/Time   INR 1.2 08/09/2023 0700     Intake/Output Summary (Last 24 hours) at 08/11/2023 0747 Last data filed at 08/11/2023 0600 Gross per 24 hour  Intake 2102.92 ml  Output 460 ml  Net 1642.92 ml      Assessment/Plan:  72 y.o. male is 2 days post op, s/p:  Redo exposure of right CFA, right CFA to peroneal artery bypass  with ipsilateral reversed GSV   -Doing well this morning but still having pain at his incision sites -R groin and RLE incisions are dry and intact -RLE warm and well perfused with brisk peroneal doppler signal. Has a good pulse in the bypass graft -Hgb after 1 unit yesterday at 9.6, will repeat tomorrow -UO overnight decreased but improving this morning. Scr bump to 1.42. Currently on LR at 75cc/hr, will decrease to 50. Will repeat CMP tomorrow -Continue mobilizing with PT/OT. Could be a candidate for CIR -Will transfer to 4E today   [name], PA-C [facility] [phone] 08/11/2023 7:47 AM  I have seen and evaluated the patient. I agree with the PA note as documented above.  Postop day 2 status post right common femoral to peroneal bypass for critical limb ischemia with tissue loss.  Palpable pulse in the bypass graft.  Brisk peroneal signal at the right ankle.  Decrease in urine output yesterday and started LR at 75 now with evidence of AKI.  Creatinine increased from 0.6 to 1.4.  Will hold his ACE inhibitor.  Urine output 460 mL over the last 24 hours.  Appears to  be picking up this morning.  Leave Foley to monitor urine output.  Okay to transfer to 4 E.  Out of bed PT OT.  Aspirin statin Plavix for risk reduction.  Heparin for DVT prophylaxis.  Hemoglobin today 7.8 --> 9.6 after 1 unit pRBC yesterday.  Has chronic anemia.  Will recheck labs tomorrow.  Continue gentle hydration but will decrease LR to 50.  Blood pressure has been difficult to monitor due to occlusive disease in both upper extremities and A-line's were not accurate during surgery.  Right upper arm cuff pressure most accurate.  Mentating fine and resting comfortably.  Cephus Shelling, MD [facility] Office: [phone]

## 2023-08-11 NOTE — Progress Notes (Signed)
  Inpatient Rehabilitation Admissions Coordinator   Met with patient at bedside for rehab assessment. We discussed goals and expectations of a possible CIR admit. I also discussed estimate cost of care if [facility] would approve CIR. He would like to think about his options. He is hesitant due to cost as well as the expectations of 3 hrs per day of therapy that is required. I will have my partner follow up with him this weekend. Please call me with any questions.   Ottie Glazier, RN, MSN Rehab Admissions Coordinator [phone]

## 2023-08-12 ENCOUNTER — Other Ambulatory Visit: Payer: Self-pay | Admitting: Student

## 2023-08-12 DIAGNOSIS — M25512 Pain in left shoulder: Secondary | ICD-10-CM

## 2023-08-12 DIAGNOSIS — M25511 Pain in right shoulder: Secondary | ICD-10-CM

## 2023-08-12 LAB — BASIC METABOLIC PANEL
Anion gap: 9 (ref 5–15)
BUN: 19 mg/dL (ref 8–23)
CO2: 27 mmol/L (ref 22–32)
Calcium: 8.3 mg/dL — ABNORMAL LOW (ref 8.9–10.3)
Chloride: 98 mmol/L (ref 98–111)
Creatinine, Ser: 0.77 mg/dL (ref 0.61–1.24)
GFR, Estimated: >60 mL/min (ref >60)
Glucose, Bld: 120 mg/dL — ABNORMAL HIGH (ref 70–99)
Potassium: 4.9 mmol/L (ref 3.5–5.1)
Sodium: 134 mmol/L — ABNORMAL LOW (ref 135–145)

## 2023-08-12 LAB — GLUCOSE, CAPILLARY
Glucose-Capillary: 113 mg/dL — ABNORMAL HIGH (ref 70–99)
Glucose-Capillary: 139 mg/dL — ABNORMAL HIGH (ref 70–99)
Glucose-Capillary: 130 mg/dL — ABNORMAL HIGH (ref 70–99)
Glucose-Capillary: 102 mg/dL — ABNORMAL HIGH (ref 70–99)

## 2023-08-12 LAB — CBC
HCT: 32.6 % — ABNORMAL LOW (ref 39.0–52.0)
Hemoglobin: 9 g/dL — ABNORMAL LOW (ref 13.0–17.0)
MCH: 19.5 pg — ABNORMAL LOW (ref 26.0–34.0)
MCHC: 27.6 g/dL — ABNORMAL LOW (ref 30.0–36.0)
MCV: 70.6 fL — ABNORMAL LOW (ref 80.0–100.0)
Platelets: 316 10*3/uL (ref 150–400)
RBC: 4.62 MIL/uL (ref 4.22–5.81)
RDW: 27.3 % — ABNORMAL HIGH (ref 11.5–15.5)
WBC: 12.9 10*3/uL — ABNORMAL HIGH (ref 4.0–10.5)
nRBC: 0 % (ref 0.0–0.2)

## 2023-08-12 NOTE — Progress Notes (Addendum)
      Subjective  - Didn't sleep well   Objective (!) 77/62 89 100 F (37.8 C) (Oral) 20 97%  Intake/Output Summary (Last 24 hours) at 08/12/2023 0700 Last data filed at 08/12/2023 0411 Gross per 24 hour  Intake 1369.46 ml  Output 539 ml  Net 830.46 ml   Incisions healing well Peroneal doppler signal with palpable graft pulse right LE Lungs non labored breathing Right foot second and third toes without change.  Wounds dry Heart irregularly irregular   Assessment/Planning: 72 y.o. male is 3 days post op, s/p:  Redo exposure of right CFA, right CFA to peroneal artery bypass with ipsilateral reversed GSV   Post op concerns for hypotension he was admitted to the ICU for close observation.  POD # 2 he was transferred to progressive unit.     Improved urine OP last 24 539 ml, Cr WNL 0.77 Cont. LR until PO intake improves to maintain kidney perfusion in the setting of chronic hypotension HGB 9.0 this am stable Working with PT/OT slow progress.  Pending progress and recommendations. EKG ordered  Consult secure message sent to DR. Graciela Husbands weekend call through [facility] for new Afib   Mosetta Pigeon 08/12/2023 7:00 AM --  Laboratory Lab Results: Recent Labs    08/11/23 0242 08/12/23 0018  WBC 16.2* 12.9*  HGB 9.6* 9.0*  HCT 34.8* 32.6*  PLT 347 316   BMET Recent Labs    08/11/23 0242 08/12/23 0018  NA 135 134*  K 4.8 4.9  CL 96* 98  CO2 24 27  GLUCOSE 148* 120*  BUN 22 19  CREATININE 1.42* 0.77  CALCIUM 8.4* 8.3*    COAG Lab Results  Component Value Date   INR 1.2 08/09/2023   INR 1.1 12/05/2020   INR 1.1 04/24/2020   No results found for: "PTT"  I have independently interviewed and examined patient and agree with PA assessment and plan above.   Rylynne Schicker C. Randie Heinz, MD [facility] [place] Office: [phone] Pager: [phone]

## 2023-08-13 LAB — GLUCOSE, CAPILLARY
Glucose-Capillary: 120 mg/dL — ABNORMAL HIGH (ref 70–99)
Glucose-Capillary: 125 mg/dL — ABNORMAL HIGH (ref 70–99)
Glucose-Capillary: 123 mg/dL — ABNORMAL HIGH (ref 70–99)
Glucose-Capillary: 150 mg/dL — ABNORMAL HIGH (ref 70–99)

## 2023-08-13 NOTE — Plan of Care (Signed)
  Problem: Nutritional: Goal: Maintenance of adequate nutrition will improve Outcome: Progressing   Problem: Activity: Goal: Ability to tolerate increased activity will improve Outcome: Progressing   Problem: Education: Goal: Knowledge of General Education information will improve Description: Including pain rating scale, medication(s)/side effects and non-pharmacologic comfort measures Outcome: Progressing   Problem: Nutrition: Goal: Adequate nutrition will be maintained Outcome: Progressing   Problem: Coping: Goal: Level of anxiety will decrease Outcome: Progressing

## 2023-08-13 NOTE — Progress Notes (Addendum)
[facility] [place]  Subjective  - awake and alert doing much better   Objective 101/80 85 97.6 F (36.4 C) (Oral) 18 98%  Intake/Output Summary (Last 24 hours) at 08/13/2023 1109 Last data filed at 08/13/2023 1000 Gross per 24 hour  Intake 1588.61 ml  Output 800 ml  Net 788.61 ml   Incisions healing well Peroneal doppler brisk with palpable graft right LE Toes are stable and dry Lungs non labored breathing  Heart RRR   Assessment/Planning: 72 y.o. male is 4 days post op, s/p:  Redo exposure of right CFA, right CFA to peroneal artery bypass with ipsilateral reversed GSV              Post op concerns for hypotension he was admitted to the ICU for close observation.  POD # 2 he was transferred to progressive unit.   Leukocytosis improved 12.9 improved from 16.2 Will allow toes to demarcate, currently stable Heart in regal rate now Patent bypass with palpable bypass pulse PT/OT pending  Urine OP good > 1900 ml last 24  Mosetta Pigeon 08/13/2023 11:09 AM --  Laboratory Lab Results: Recent Labs    08/11/23 0242 08/12/23 0018  WBC 16.2* 12.9*  HGB 9.6* 9.0*  HCT 34.8* 32.6*  PLT 347 316   BMET Recent Labs    08/11/23 0242 08/12/23 0018  NA 135 134*  K 4.8 4.9  CL 96* 98  CO2 24 27  GLUCOSE 148* 120*  BUN 22 19  CREATININE 1.42* 0.77  CALCIUM 8.4* 8.3*    COAG Lab Results  Component Value Date   INR 1.2 08/09/2023   INR 1.1 12/05/2020   INR 1.1 04/24/2020   No results found for: "PTT"   I have independently interviewed and examined patient and agree with PA assessment and plan above.   Kayliana Codd C. Randie Heinz, MD [facility] [place] Office: [phone] Pager: [phone]

## 2023-08-14 DIAGNOSIS — I70221 Atherosclerosis of native arteries of extremities with rest pain, right leg: Secondary | ICD-10-CM

## 2023-08-14 DIAGNOSIS — Z89612 Acquired absence of left leg above knee: Secondary | ICD-10-CM | POA: Diagnosis not present

## 2023-08-14 LAB — GLUCOSE, CAPILLARY
Glucose-Capillary: 124 mg/dL — ABNORMAL HIGH (ref 70–99)
Glucose-Capillary: 112 mg/dL — ABNORMAL HIGH (ref 70–99)
Glucose-Capillary: 130 mg/dL — ABNORMAL HIGH (ref 70–99)
Glucose-Capillary: 137 mg/dL — ABNORMAL HIGH (ref 70–99)
Glucose-Capillary: 111 mg/dL — ABNORMAL HIGH (ref 70–99)

## 2023-08-14 MED ORDER — CHLORHEXIDINE GLUCONATE CLOTH 2 % EX PADS
6.0000 | MEDICATED_PAD | Freq: Every day | CUTANEOUS | Status: DC
  Administered 2023-08-14 – 2023-08-16 (×3): 6 via TOPICAL

## 2023-08-14 NOTE — Progress Notes (Addendum)
Inpatient Rehab Admissions Coordinator:   Met with patient at bedside. He reports he is agreeable to pursue CIR. Attempted to reach wife to discuss caregiver support upon discharge home, had to leave message. Will continue to follow for potential pursuit of CIR admission.   Addendum: we will also need updated OT note to open insurance if patient is appropriate.   Rehab Admissons Coordinator [place], [place], [place] [phone]

## 2023-08-14 NOTE — Progress Notes (Addendum)
Progress Note    08/14/2023 7:10 AM 5 Days Post-Op  Subjective:  feeling good, no complaints   Vitals:   08/13/23 2000 08/14/23 0600  BP:  133/77  Pulse:  (!) 46  Resp: 18 20  Temp: 99.8 F (37.7 C)   SpO2:  98%    Physical Exam: General:  resting comfortably Cardiac:  NSR on the monitor Lungs:  nonlabored Incisions:  RLE incisions intact and dry Extremities:  brisk right peroneal doppler signal. Palpable pulse in bypass graft   CBC    Component Value Date/Time   WBC 12.9 (H) 08/12/2023 0018   RBC 4.62 08/12/2023 0018   HGB 9.0 (L) 08/12/2023 0018   HGB 9.9 (L) 02/23/2023 1210   HCT 32.6 (L) 08/12/2023 0018   HCT 34.6 (L) 02/23/2023 1210   PLT 316 08/12/2023 0018   PLT 362 02/23/2023 1210   MCV 70.6 (L) 08/12/2023 0018   MCV 67 (L) 02/23/2023 1210   MCH 19.5 (L) 08/12/2023 0018   MCHC 27.6 (L) 08/12/2023 0018   RDW 27.3 (H) 08/12/2023 0018   RDW 18.3 (H) 02/23/2023 1210   LYMPHSABS 1.7 03/03/2022 1615   MONOABS 1.1 (H) 02/20/2021 0150   EOSABS 0.2 03/03/2022 1615   BASOSABS 0.1 03/03/2022 1615    BMET    Component Value Date/Time   NA 134 (L) 08/12/2023 0018   NA 136 02/23/2023 1210   K 4.9 08/12/2023 0018   CL 98 08/12/2023 0018   CO2 27 08/12/2023 0018   GLUCOSE 120 (H) 08/12/2023 0018   BUN 19 08/12/2023 0018   BUN 8 02/23/2023 1210   CREATININE 0.77 08/12/2023 0018   CREATININE 0.94 03/31/2015 1454   CALCIUM 8.3 (L) 08/12/2023 0018   GFRNONAA >60 08/12/2023 0018   GFRAA 106 11/30/2020 0944    INR    Component Value Date/Time   INR 1.2 08/09/2023 0700     Intake/Output Summary (Last 24 hours) at 08/14/2023 0710 Last data filed at 08/13/2023 1949 Gross per 24 hour  Intake 840 ml  Output 1150 ml  Net -310 ml      Assessment/Plan:  72 y.o. male is 5 days post op, s/p: Redo exposure of right CFA, right CFA to peroneal artery bypass with ipsilateral reversed GSV   -Doing well this morning. No pain in right leg -RLE incisions  intact and dry. RLE warm and well perfused with brisk peroneal doppler signal. Palpable pulse in bypass graft -UO good at 1150cc in 24 hrs. Scr back to baseline 2 days ago. Ok to remove foley today -Sinus rhythm with multiple SVPBs this morning. Did have episode of a-fib this weekend. Will order another formal EKG -Wounds on right foot dry and stable. Will continue to demarcate -PT/OT has previously recommended CIR, pending further recs. Continue to mobilize as tolerated   [name], PA-C [facility] [phone] 08/14/2023 7:10 AM   I have seen and evaluated the patient. I agree with the PA note as documented above.  Postop day 5 status post right common femoral to peroneal bypass for critical limb ischemia with tissue loss.  Has a palpable pulse in the bypass graft.  Brisk peroneal signal at the ankle.  Will discontinue Foley today.  He is on aspirin Plavix for risk reduction.  Cr improved over the weekend.  Patient is interested in CIR and appreciate their input.  Appears to have irregular heart rhythm this morning we will check EKG to ensure this is not atrial fibrillation.  Cephus Shelling, MD [facility] [place] Office: [phone]

## 2023-08-14 NOTE — Progress Notes (Signed)
Physical Therapy Treatment Patient Details Name: [name] MRN: [id number] DOB: 1951/06/13 Today's Date: 08/14/2023   History of Present Illness Patient is a 72 y/o male admitted 08/09/23 for R common femoral artery to peroneal bypass with ipsilateral reversed great saphenous vein. 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 ACDF, back surgery, DM, GERD, HLD, HTN, PAD, tobacco use and shoulder rotator cuff tear B per pt.    PT Comments  Pt received in supine and agreeable to session. Pt requiring increased time to perform mobility tasks due to RLE pain, however is able to tolerate transfer to the recliner. Pt able to perform RLE exercise seated at EOB to promote knee ROM. Pt able to sit to EOB with CGA and lateral transfer to the recliner with min A progressing to CGA. Pt demonstrates improved bottom clearance and RLE WB tolerance with scoots. Pt continues to benefit from PT services to progress toward functional mobility goals.     If plan is discharge home, recommend the following: A lot of help with walking and/or transfers;A lot of help with bathing/dressing/bathroom;Assistance with cooking/housework;Assist for transportation   Can travel by private vehicle        Equipment Recommendations  None recommended by PT    Recommendations for Other Services       Precautions / Restrictions Precautions Precautions: Fall Precaution Comments: L AKA Restrictions Weight Bearing Restrictions: No     Mobility  Bed Mobility Overal bed mobility: Needs Assistance Bed Mobility: Supine to Sit     Supine to sit: HOB elevated, Used rails, Contact guard     General bed mobility comments: increased time and effort, but pt able to perform without assist    Transfers Overall transfer level: Needs assistance Equipment used: None Transfers: Bed to chair/wheelchair/BSC            Lateral/Scoot Transfers: Min assist, Contact  guard assist General transfer comment: Pt able to lateral scoot into recliner with initial min A with bedpad progressing to CGA. cues for anterior lean and increased bottom clearance        Balance Overall balance assessment: Needs assistance Sitting-balance support: Feet supported Sitting balance-Leahy Scale: Fair Sitting balance - Comments: sitting EOB       Standing balance comment: NT                            Cognition Arousal: Alert Behavior During Therapy: WFL for tasks assessed/performed Overall Cognitive Status: Within Functional Limits for tasks assessed                                          Exercises General Exercises - Lower Extremity Long Arc Quad: AROM, Right, Seated, 5 reps    General Comments General comments (skin integrity, edema, etc.): VSS on RA      Pertinent Vitals/Pain Pain Assessment Pain Assessment: 0-10 Pain Score: 6  Pain Location: R LE Pain Descriptors / Indicators: Burning, Discomfort, Throbbing Pain Intervention(s): Monitored during session, Repositioned, Limited activity within patient's tolerance     PT Goals (current goals can now be found in the care plan section) Acute Rehab PT Goals Patient Stated Goal: to return to independent PT Goal Formulation: With patient Time For Goal Achievement: 08/24/23 Potential to Achieve Goals: Good Progress towards PT goals: Progressing toward goals  Frequency    Min 1X/week      PT Plan         AM-PAC PT "6 Clicks" Mobility   Outcome Measure  Help needed turning from your back to your side while in a flat bed without using bedrails?: A Little Help needed moving from lying on your back to sitting on the side of a flat bed without using bedrails?: A Little Help needed moving to and from a bed to a chair (including a wheelchair)?: A Little Help needed standing up from a chair using your arms (e.g., wheelchair or bedside chair)?: Total Help needed to  walk in hospital room?: Total Help needed climbing 3-5 steps with a railing? : Total 6 Click Score: 12    End of Session   Activity Tolerance: Patient limited by pain Patient left: in chair;with call bell/phone within reach Nurse Communication: Mobility status PT Visit Diagnosis: Other abnormalities of gait and mobility (R26.89);Muscle weakness (generalized) (M62.81);Pain     Time: 1443-1540 PT Time Calculation (min) (ACUTE ONLY): 24 min  Charges:    $Therapeutic Activity: 23-37 mins PT General Charges $$ ACUTE PT VISIT: 1 Visit                     Johny Shock, PTA Acute Rehabilitation Services Secure Chat Preferred  [phone]    Johny Shock 08/14/2023, 12:47 PM

## 2023-08-14 NOTE — Progress Notes (Signed)
Mobility Specialist Progress Note:   08/14/23 1500  Mobility  Activity Dangled on edge of bed (Seated march + Knee extension)  Level of Assistance Minimal assist, patient does 75% or more  Assistive Device None  Activity Response Tolerated well  Mobility Referral Yes  $Mobility charge 1 Mobility  Mobility Specialist Start Time (ACUTE ONLY) 1519  Mobility Specialist Stop Time (ACUTE ONLY) 1533  Mobility Specialist Time Calculation (min) (ACUTE ONLY) 14 min    Pre Mobility: 73 HR During Mobility: 80 HR Post Mobility:  77 HR  Pt received in bed, agreeable to mobility. Performed RLE ROM exercises on EOB. 2 sets of 10 reps of seated marches and 2 sets of 5 reps of knee extension. C/o of pain in R foot, but knows its to be expected. Pt left in bed with call bell and all needs met.  [facility] Earlene Plater Mobility Specialist Please contact via Special educational needs teacher or Rehab office at [phone]

## 2023-08-14 NOTE — Care Management Important Message (Signed)
Important Message  Patient Details  Name: [name] MRN: [id number] Date of Birth: 12-26-1951   Medicare Important Message Given:  Yes     Sherilyn Banker 08/14/2023, 3:20 PM

## 2023-08-14 NOTE — Consult Note (Signed)
Physical Medicine and Rehabilitation Consult Reason for Consult:impaired functional mobility  Referring Physician: Chestine Spore   HPI: [name] is a 72 y.o. 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  He had previous right femoral endarterectomy in the past as well as stenting of the right common and external iliac arteries.  Patient developed worsening ischemia in the right leg and ultimately on 8/14 presented for redo exposure of the right common femoral artery with harvest of right lower extremity great saphenous vein and right common femoral artery to peroneal artery bypass.  Patient seems to have tolerated surgery well with expected pain at his incision sites.  Wounds have remained dry and stable with ongoing demarcation taking place in the right foot.  Patient was up with physical therapy today and was min assist to contact-guard assist for lateral scoot transfers.  The patient has a prosthesis at home for his left leg but apparently it has been nonfunctioning.  He is primarily using a wheelchair for mobility before this hospitalization.   Review of Systems  Constitutional: Negative.   HENT: Negative.    Eyes: Negative.   Respiratory: Negative.    Cardiovascular: Negative.   Gastrointestinal: Negative.   Genitourinary: Negative.   Musculoskeletal:  Positive for joint pain and myalgias.  Skin: Negative.   Neurological:  Positive for tingling and weakness.  Psychiatric/Behavioral:  Negative for suicidal ideas.    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   Critical lower  limb ischemia (HCC) 12/19/2019   Diabetes mellitus    takes Metformin daily   GERD (gastroesophageal reflux disease)    takes Protonix daily   History of blood transfusion    "related to low HgB" ((09/10/2015   Hyperlipidemia    takes Vytorin daily   Hypertension    takes Benazepril and Bystolic daily   PAD (peripheral artery disease) (HCC)    Pneumonia    Radiculopathy, cervical region 02/11/2017   Shortness of breath dyspnea    with exertion   Tobacco user    Toe fracture, right 05/09/2011   Wears glasses    Past Surgical History:  Procedure Laterality Date   ABDOMINAL AORTOGRAM W/LOWER EXTREMITY N/A 12/11/2017   Procedure: ABDOMINAL AORTOGRAM W/LOWER EXTREMITY;  Surgeon: Chuck Hint, MD;  Location: [facility] INVASIVE CV LAB;  Service: Cardiovascular;  Laterality: N/A;   ABDOMINAL AORTOGRAM W/LOWER EXTREMITY Bilateral 04/22/2020   Procedure: ABDOMINAL AORTOGRAM W/LOWER EXTREMITY;  Surgeon: Cephus Shelling, MD;  Location: [facility] INVASIVE CV LAB;  Service: Cardiovascular;  Laterality: Bilateral;   ABDOMINAL AORTOGRAM W/LOWER EXTREMITY N/A 07/27/2023   Procedure: ABDOMINAL AORTOGRAM W/LOWER EXTREMITY;  Surgeon: Cephus Shelling, MD;  Location: [facility] INVASIVE CV LAB;  Service: Cardiovascular;  Laterality: N/A;   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   AMPUTATION Left 02/19/2021   Procedure: LEFT ABOVE KNEE AMPUTATION;  Surgeon: Leonie Douglas, MD;  Location: [facility] OR;  Service: Vascular;  Laterality: Left;   ANTERIOR CERVICAL DECOMP/DISCECTOMY FUSION  03/08/12   C6-7   ANTERIOR CERVICAL DECOMP/DISCECTOMY FUSION  03/08/2012   Procedure: ANTERIOR CERVICAL DECOMPRESSION/DISCECTOMY FUSION 1 LEVEL/HARDWARE REMOVAL;  Surgeon: Maeola Harman, MD;  Location: [facility] NEURO ORS;  Service: Neurosurgery;  Laterality: N/A;  revison of C5-7 anterior cervical decompression with fusion with Cervical Five-Thoracic One anterior cervical decompression with fusion with interbody prothesis plating and bonegraft    BACK SURGERY  1996   BIOPSY  12/05/2020   Procedure: BIOPSY;  Surgeon: Meryl Dare, MD;  Location: [facility] ENDOSCOPY;  Service: Endoscopy;;   BYPASS GRAFT FEMORAL-PERONEAL Left 11/11/2016   Procedure: REDO LEFT FEMORAL-PERONEAL BYPASS WITH PROPATEN X 80CM GRAFT;  Surgeon: Chuck Hint, MD;  Location: [facility] OR;  Service: Vascular;  Laterality: Left;   BYPASS GRAFT FEMORAL-PERONEAL Right 08/09/2023   Procedure: RIGHT  FEMORAL  ARTERY TO PERONEAL ARTERY BYPASS GRAFT USING GREATER SAPHENOUS VEIN;  Surgeon: Cephus Shelling, MD;  Location: [facility] OR;  Service: Vascular;  Laterality: Right;   COLONOSCOPY W/ BIOPSIES AND POLYPECTOMY  08/17/2012   f/u 5 years, 4 polyps, no high grade dysplasia or malignancy, tubular adenoma, hyperplastic polyops   COLONOSCOPY WITH PROPOFOL N/A 12/05/2020   Procedure: COLONOSCOPY WITH PROPOFOL;  Surgeon: Meryl Dare, MD;  Location: [facility] ENDOSCOPY;  Service: Endoscopy;  Laterality: N/A;   EMBOLECTOMY Left 12/19/2019   Procedure: LEFT FEMERAL- PERONEAL THROMBECTOMY;  Surgeon: Cephus Shelling, MD;  Location: [facility] OR;  Service: Vascular;  Laterality: Left;   ENDARTERECTOMY FEMORAL Right 04/24/2020   Procedure: RIGHT FEMORAL ENDARTERECTOMY;  Surgeon: Cephus Shelling, MD;  Location: [facility] OR;  Service: Vascular;  Laterality: Right;   ENTEROSCOPY N/A 12/11/2015   Procedure: ENTEROSCOPY;  Surgeon: Jeani Hawking, MD;  Location: [facility] ENDOSCOPY;  Service: Endoscopy;  Laterality: N/A;   ENTEROSCOPY N/A 03/29/2018   Procedure: ENTEROSCOPY;  Surgeon: Jeani Hawking, MD;  Location: [facility] ENDOSCOPY;  Service: Endoscopy;  Laterality: N/A;   ESOPHAGOGASTRODUODENOSCOPY  08/17/2012   normal esophagus and GEJ, diffuse gastritis with erythema- no malignancy, reactive gastropathy  with focal intestinal metaplasia   ESOPHAGOGASTRODUODENOSCOPY (EGD) WITH PROPOFOL N/A 12/05/2020   Procedure: ESOPHAGOGASTRODUODENOSCOPY (EGD) WITH PROPOFOL;  Surgeon: Meryl Dare, MD;  Location: [facility]  ENDOSCOPY;  Service: Endoscopy;  Laterality: N/A;   FEMORAL-POPLITEAL BYPASS GRAFT Left 01/06/2016   Procedure: Left  COMMON FEMORAL-BELOW KNEE POPLITEAL ARTERY Bypass using non-reversed translocated saphenous vein graft from left leg;  Surgeon: Pryor Ochoa, MD;  Location: [facility] OR;  Service: Vascular;  Laterality: Left;   GIVENS CAPSULE STUDY N/A 11/24/2015   Procedure: GIVENS CAPSULE STUDY;  Surgeon: Charna Elizabeth, MD;  Location: [facility] ENDOSCOPY;  Service: Endoscopy;  Laterality: N/A;   HOT HEMOSTASIS N/A 03/29/2018   Procedure: HOT HEMOSTASIS (ARGON PLASMA COAGULATION/BICAP);  Surgeon: Jeani Hawking, MD;  Location: [facility] ENDOSCOPY;  Service: Endoscopy;  Laterality: N/A;   INGUINAL HERNIA REPAIR  1990's   right   INTRAOPERATIVE ARTERIOGRAM Left 01/06/2016   Procedure: INTRA OPERATIVE ARTERIOGRAM LEFT LOWER LEG;  Surgeon: Pryor Ochoa, MD;  Location: [facility] OR;  Service: Vascular;  Laterality: Left;   INTRAOPERATIVE ARTERIOGRAM Left 11/11/2016   Procedure: INTRA OPERATIVE ARTERIOGRAM LEFT LOWER EXTRIMITY;  Surgeon: Chuck Hint, MD;  Location: [facility] OR;  Service: Vascular;  Laterality: Left;   IR ANGIOGRAM FOLLOW UP STUDY  12/11/2017   IR GENERIC HISTORICAL  10/24/2016   IR ANGIOGRAM FOLLOW UP STUDY   LOWER EXTREMITY ANGIOGRAM Bilateral 07/30/2015   Procedure: Lower Extremity Angiogram;  Surgeon: Fransisco Hertz, MD;  Location: [facility] INVASIVE CV LAB;  Service: Cardiovascular;  Laterality: Bilateral;   LUMBAR  DISC SURGERY  1996   "lower"   PATCH ANGIOPLASTY Left 12/19/2019   Procedure: LEFT FEMORAL -PERONEAL PATCH ANGIOPLASTY WITH XENOSURE BIOLOGIC PATCH  ;  Surgeon: Cephus Shelling, MD;  Location: [facility] OR;  Service: Vascular;  Laterality: Left;   PATCH ANGIOPLASTY Right 04/24/2020   Procedure: Patch Angioplasty of Right Femoral Artery using Long Xenosure Biologic Patch 1cm x 14 cm;  Surgeon: Cephus Shelling, MD;  Location: [facility] OR;  Service: Vascular;  Laterality: Right;   PERIPHERAL VASCULAR  CATHETERIZATION N/A 07/30/2015   Procedure: Abdominal Aortogram;  Surgeon: Fransisco Hertz, MD;  Location: [facility] INVASIVE CV LAB;  Service: Cardiovascular;  Laterality: N/A;   PERIPHERAL VASCULAR CATHETERIZATION N/A 10/24/2016   Procedure: Abdominal Aortogram;  Surgeon: Chuck Hint, MD;  Location: [facility] INVASIVE CV LAB;  Service: Cardiovascular;  Laterality: N/A;   PERIPHERAL VASCULAR CATHETERIZATION N/A 10/24/2016   Procedure: Lower Extremity Angiography;  Surgeon: Chuck Hint, MD;  Location: [facility] INVASIVE CV LAB;  Service: Cardiovascular;  Laterality: N/A;   PERIPHERAL VASCULAR INTERVENTION Right 12/11/2017   Procedure: PERIPHERAL VASCULAR INTERVENTION;  Surgeon: Chuck Hint, MD;  Location: [facility] INVASIVE CV LAB;  Service: Cardiovascular;  Laterality: Right;   PERIPHERAL VASCULAR INTERVENTION Right 07/27/2023   Procedure: PERIPHERAL VASCULAR INTERVENTION;  Surgeon: Cephus Shelling, MD;  Location: [facility] INVASIVE CV LAB;  Service: Cardiovascular;  Laterality: Right;  Common Illiac   VASCULAR SURGERY  ~ 2007   Stent SFA    VEIN HARVEST Left 01/06/2016   Procedure: LEFT GREATER SAPPHENOUS VEIN HARVEST;  Surgeon: Pryor Ochoa, MD;  Location: [facility] OR;  Service: Vascular;  Laterality: Left;   VEIN HARVEST Right 08/09/2023   Procedure: RIGHT GREATER SAPHENOUS VEIN HARVEST;  Surgeon: Cephus Shelling, MD;  Location: [facility] OR;  Service: Vascular;  Laterality: Right;   Family History  Problem Relation Age of Onset   Cancer Mother        colon Cancer   Hyperlipidemia Mother    Diabetes Mother    Heart disease Father    Hypertension Father    Cancer Sister        Uterine   Diabetes Sister    Diabetes Brother    Social History:  reports that he has been smoking cigars. He has been exposed to tobacco smoke. He has never used smokeless tobacco. He reports current alcohol use of about 2.0 standard drinks of alcohol per week. He reports that he does not use drugs. Allergies:  Allergies   Allergen Reactions   Glipizide Other (See Comments)    REACTION IS SIDE EFFECT Severe hypoglycemia to 40s.    Medications Prior to Admission  Medication Sig Dispense Refill   albuterol (VENTOLIN HFA) 108 (90 Base) MCG/ACT inhaler TAKE 2 PUFFS BY MOUTH EVERY 6 HOURS AS NEEDED FOR WHEEZE OR SHORTNESS OF BREATH 8.5 each 1   amLODipine (NORVASC) 5 MG tablet Take 1 tablet (5 mg total) by mouth at bedtime. 90 tablet 3   aspirin EC 81 MG tablet Take 1 tablet (81 mg total) by mouth daily. Swallow whole.     atorvastatin (LIPITOR) 40 MG tablet TAKE 1 TABLET BY MOUTH EVERY DAY 90 tablet 1   benazepril (LOTENSIN) 5 MG tablet TAKE 1 TABLET BY MOUTH EVERY DAY 90 tablet 3   clopidogrel (PLAVIX) 75 MG tablet TAKE 1 TABLET BY MOUTH EVERY DAY 90 tablet 0   ezetimibe (ZETIA) 10 MG tablet TAKE 1 TABLET BY MOUTH EVERY DAY 90 tablet 1  FLUoxetine (PROZAC) 10 MG capsule TAKE 1 CAPSULE BY MOUTH EVERY DAY 90 capsule 3   gabapentin (NEURONTIN) 100 MG capsule Take 1 capsule (100 mg total) by mouth 3 (three) times daily. (Patient taking differently: Take 100 mg by mouth at bedtime.) 90 capsule 3   gabapentin (NEURONTIN) 300 MG capsule TAKE 1 CAPSULE BY MOUTH THREE TIMES A DAY AS NEEDED 90 capsule 1   metFORMIN (GLUCOPHAGE) 1000 MG tablet TAKE 1 TABLET BY MOUTH TWICE A DAY WITH FOOD 180 tablet 1   Nebivolol HCl 20 MG TABS TAKE 1 TABLET BY MOUTH EVERY DAY 90 tablet 3   nicotine polacrilex (NICORETTE) 2 MG gum Take 1 each (2 mg total) by mouth as needed for smoking cessation. 100 tablet 3   oxyCODONE-acetaminophen (PERCOCET) 5-325 MG tablet Take 1 tablet by mouth every 4 (four) hours as needed for severe pain. 30 tablet 0   pantoprazole (PROTONIX) 40 MG tablet TAKE 1 TABLET BY MOUTH EVERY DAY AS NEEDED 90 tablet 1   polyethylene glycol powder (GLYCOLAX/MIRALAX) 17 GM/SCOOP powder Take 17 g by mouth daily. (Patient not taking: Reported on 08/08/2023) 500 g 0    Home: Home Living Family/patient expects to be discharged  to:: Private residence Living Arrangements: Spouse/significant other Available Help at Discharge: Family, Available 24 hours/day Type of Home: House Home Access: Ramped entrance Home Layout: One level Bathroom Shower/Tub: Engineer, manufacturing systems: Standard Bathroom Accessibility: Yes Home Equipment: Tub bench, Art gallery manager, Wheelchair - manual, Agricultural consultant (2 wheels), BSC/3in1  Functional History: Prior Function Prior Level of Function : Independent/Modified Independent Mobility Comments: using wheelchair in the home due to R leg giving out when attempting walking with prosthetic. ADLs Comments: mod I with showering with tub bench Functional Status:  Mobility: Bed Mobility Overal bed mobility: Needs Assistance Bed Mobility: Supine to Sit Supine to sit: HOB elevated, Used rails, Contact guard Sit to supine: Mod assist General bed mobility comments: increased time and effort, but pt able to perform without assist Transfers Overall transfer level: Needs assistance Equipment used: None Transfers: Bed to chair/wheelchair/BSC Bed to/from chair/wheelchair/BSC transfer type:: Lateral/scoot transfer Squat pivot transfers: Mod assist, +2 physical assistance  Lateral/Scoot Transfers: Min assist, Contact guard assist General transfer comment: Pt able to lateral scoot into recliner with initial min A with bedpad progressing to CGA. cues for anterior lean and increased bottom clearance Ambulation/Gait General Gait Details: unable baseline    ADL: ADL Overall ADL's : Needs assistance/impaired Eating/Feeding: Independent, Sitting Grooming: Set up, Sitting Upper Body Bathing: Set up, Sitting Lower Body Bathing: Moderate assistance, Sitting/lateral leans Upper Body Dressing : Set up, Sitting Lower Body Dressing: Moderate assistance, Sitting/lateral leans Toilet Transfer: Moderate assistance, +2 for physical assistance  Cognition: Cognition Overall Cognitive Status: Within  Functional Limits for tasks assessed Orientation Level: Oriented X4 Cognition Arousal: Alert Behavior During Therapy: WFL for tasks assessed/performed Overall Cognitive Status: Within Functional Limits for tasks assessed  Blood pressure 90/68, pulse 77, temperature 98 F (36.7 C), temperature source Oral, resp. rate 19, height 5\' 7"  (1.702 m), weight 85.9 kg, SpO2 96%. Physical Exam Constitutional:      Appearance: He is obese.  HENT:     Head: Normocephalic.     Right Ear: External ear normal.     Left Ear: External ear normal.     Nose: Nose normal.     Mouth/Throat:     Mouth: Mucous membranes are moist.  Eyes:     Pupils: Pupils are equal, round, and reactive to  light.  Cardiovascular:     Rate and Rhythm: Normal rate.  Pulmonary:     Effort: Pulmonary effort is normal.  Abdominal:     Palpations: Abdomen is soft.  Musculoskeletal:        General: Normal range of motion.     Cervical back: Normal range of motion.     Comments: Left AKA intact. Right lower extremity still tender. swollen  Skin:    General: Skin is warm.     Comments: Scattered bruises, incisions RLE. All appear clean and dry. Ischemic areas on 2nd and 3rd toes. ?small areas on other toes. Skin on foot is all dry.   Neurological:     General: No focal deficit present.     Mental Status: He is alert and oriented to person, place, and time.     Comments: RUE motor 3/5 prox to 5/5 distally (limited by RTC injury). LUE similar 3-4/5 prox to 5/5 distally. LLE 4/5. RLE limited by pain.   Psychiatric:        Mood and Affect: Mood normal.        Behavior: Behavior normal.     Results for orders placed or performed during the hospital encounter of 08/09/23 (from the past 24 hour(s))  Glucose, capillary     Status: Abnormal   Collection Time: 08/13/23  5:00 PM  Result Value Ref Range   Glucose-Capillary 123 (H) 70 - 99 mg/dL   Comment 1 Notify RN    Comment 2 Document in Chart   Glucose, capillary      Status: Abnormal   Collection Time: 08/13/23  9:09 PM  Result Value Ref Range   Glucose-Capillary 150 (H) 70 - 99 mg/dL  Glucose, capillary     Status: Abnormal   Collection Time: 08/14/23  5:23 AM  Result Value Ref Range   Glucose-Capillary 124 (H) 70 - 99 mg/dL  Glucose, capillary     Status: Abnormal   Collection Time: 08/14/23 10:04 AM  Result Value Ref Range   Glucose-Capillary 112 (H) 70 - 99 mg/dL  Glucose, capillary     Status: Abnormal   Collection Time: 08/14/23 12:52 PM  Result Value Ref Range   Glucose-Capillary 130 (H) 70 - 99 mg/dL   *Note: Due to a large number of results and/or encounters for the requested time period, some results have not been displayed. A complete set of results can be found in Results Review.   No results found.  Assessment/Plan: Diagnosis: 72 yo  male with hx of Left AKA and residual significant PAD involving RLE who is now s/p common fem to peroneal artery BPG.  Does the need for close, 24 hr/day medical supervision in concert with the patient's rehab needs make it unreasonable for this patient to be served in a less intensive setting? Yes Co-Morbidities requiring supervision/potential complications:  -bilateral rotator cuff injuries which restrict shoulder ROM and strength -ischemia right foot -diabetes with peripheral neuropathy Due to bladder management, bowel management, safety, skin/wound care, disease management, medication administration, pain management, and patient education, does the patient require 24 hr/day rehab nursing? Yes Does the patient require coordinated care of a physician, rehab nurse, therapy disciplines of PT, OT to address physical and functional deficits in the context of the above medical diagnosis(es)? Yes Addressing deficits in the following areas: balance, endurance, locomotion, strength, transferring, bowel/bladder control, bathing, dressing, feeding, grooming, toileting, and psychosocial support Can the patient  actively participate in an intensive therapy program of at least 3 hrs  of therapy per day at least 5 days per week? Yes The potential for patient to make measurable gains while on inpatient rehab is excellent Anticipated functional outcomes upon discharge from inpatient rehab are modified independent  with PT, modified independent with OT, n/a with SLP at a wheelchair level Estimated rehab length of stay to reach the above functional goals is: 7 days Anticipated discharge destination: Home Overall Rehab/Functional Prognosis: excellent  POST ACUTE RECOMMENDATIONS: This patient's condition is appropriate for continued rehabilitative care in the following setting: CIR Patient has agreed to participate in recommended program. Yes Note that insurance prior authorization may be required for reimbursement for recommended care.  Comment: Pt is motivated to regain functional mobility. Wife is at home to provide supervision. Home is w/c accessible. Rehab Admissions Coordinator to follow up       I have personally performed a face to face diagnostic evaluation of this patient. Additionally, I have examined the patient's medical record including any pertinent labs and radiographic images. If the physician assistant has documented in this note, I have reviewed and edited or otherwise concur with the physician assistant's documentation.  Thanks,  Ranelle Oyster, MD 08/14/2023

## 2023-08-14 NOTE — TOC Progression Note (Signed)
Transition of Care [facility]) - Progression Note    Patient Details  Name: [name] MRN: [id number] Date of Birth: April 10, 1951  [facility] CM/SW Contact  Kermit Balo, RN Phone Number: 08/14/2023, 4:08 PM  Clinical Narrative:    Pt has scooter/ wheelchair/ BSC/ walker/ shower seat at home. His wife has been providing transportation but he feels she will be unable after this hospitalization. He says his [employer] offers 8 rides/ year. Pt may need SCAT application before d/c home. Current plan is for CIR.  TOC following.   Expected Discharge Plan: Home w Home Health Services Barriers to Discharge: Continued Medical Work up  Expected Discharge Plan and Services   Discharge Planning Services: CM Consult Post Acute Care Choice: Home Health Living arrangements for the past 2 months: Single Family Home                           HH Arranged: PT, OT HH Agency: [facility] Date HH Agency Contacted: 08/10/23 Time HH Agency Contacted: 1448 Representative spoke with at [facility] Agency: Morrie Sheldon   Social Determinants of Health (SDOH) Interventions SDOH Screenings   Food Insecurity: No Food Insecurity (08/09/2023)  Housing: Low Risk  (08/09/2023)  Transportation Needs: Unmet Transportation Needs (08/09/2023)  Utilities: At Risk (08/09/2023)  Alcohol Screen: Low Risk  (02/10/2023)  Depression (PHQ2-9): Low Risk  (06/14/2023)  Financial Resource Strain: Low Risk  (02/10/2023)  Physical Activity: Insufficiently Active (02/10/2023)  Social Connections: Moderately Isolated (02/10/2023)  Stress: No Stress Concern Present (02/10/2023)  Tobacco Use: High Risk (08/09/2023)    Readmission Risk Interventions    02/22/2021   12:24 PM  Readmission Risk Prevention Plan  Transportation Screening Complete  PCP or Specialist Appt within 5-7 Days Complete  Home Care Screening Complete  Medication Review (RN CM) Complete

## 2023-08-15 LAB — GLUCOSE, CAPILLARY
Glucose-Capillary: 114 mg/dL — ABNORMAL HIGH (ref 70–99)
Glucose-Capillary: 95 mg/dL (ref 70–99)
Glucose-Capillary: 114 mg/dL — ABNORMAL HIGH (ref 70–99)
Glucose-Capillary: 122 mg/dL — ABNORMAL HIGH (ref 70–99)

## 2023-08-15 NOTE — Progress Notes (Signed)
Hold bystolic po due to pt's low bp.  Lawson Radar, RN

## 2023-08-15 NOTE — Progress Notes (Signed)
Mobility Specialist Progress Note:   08/15/23 1400  Mobility  Activity Dangled on edge of bed (RLE ROM)  Level of Assistance Minimal assist, patient does 75% or more  Assistive Device None  Range of Motion/Exercises Active  Activity Response Tolerated fair  Mobility Referral Yes  $Mobility charge 1 Mobility  Mobility Specialist Start Time (ACUTE ONLY) 1440  Mobility Specialist Stop Time (ACUTE ONLY) 1455  Mobility Specialist Time Calculation (min) (ACUTE ONLY) 15 min    Pre Mobility: 83 HR Post Mobility:  77 HR  Pt received in bed, agreeable to mobility. Requiring MinA to get to EOB. Performed x2 sets of 10 reps of knee extension and plantarflexion. C/o pain in RLE, says his pain is usually onset of movement. RN aware. Pt left in bed with call bell and all needs met.  D'Vante Earlene Plater Mobility Specialist Please contact via Special educational needs teacher or Rehab office at [phone]

## 2023-08-15 NOTE — Progress Notes (Signed)
Occupational Therapy Treatment Patient Details Name: [name] MRN: [id number] DOB: May 10, 1951 Today's Date: 08/15/2023   History of present illness Patient is a 72 y/o male admitted 08/09/23 for R common femoral artery to peroneal bypass with ipsilateral reversed great saphenous vein. 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 ACDF, back surgery, DM, GERD, HLD, HTN, PAD, tobacco use and shoulder rotator cuff tear B per pt.   OT comments  Patient with fair progress toward patient focused goals.  Patient continues to express R lower extremity pain, but is transferring much better, lateral scoot with Min A.  Patient needing Mod A for lower body ADL from a sitting position and with CGA for sitting balance.  Patient tolerating treatment much better, and does demonstrate the ability to participate with a higher intensity rehab.  Patient continues to function below baseline, and Patient will benefit from intensive inpatient follow up therapy, >3 hours/day.  OT will continue to address deficits in the acute setting.         If plan is discharge home, recommend the following:  A lot of help with bathing/dressing/bathroom;Two people to help with walking and/or transfers;Help with stairs or ramp for entrance;Assist for transportation;Assistance with cooking/housework   Equipment Recommendations  None recommended by OT    Recommendations for Other Services      Precautions / Restrictions Precautions Precautions: Fall Precaution Comments: L AKA Restrictions Weight Bearing Restrictions: No       Mobility Bed Mobility Overal bed mobility: Needs Assistance Bed Mobility: Sit to Supine       Sit to supine: Supervision   General bed mobility comments: increased time and effort, but pt able to perform without assist    Transfers Overall transfer level: Needs assistance Equipment used: None Transfers: Bed to  chair/wheelchair/BSC            Lateral/Scoot Transfers: Min assist, Contact guard assist       Balance Overall balance assessment: Needs assistance Sitting-balance support: Feet supported Sitting balance-Leahy Scale: Good                                     ADL either performed or assessed with clinical judgement   ADL   Eating/Feeding: Independent;Sitting   Grooming: Set up;Sitting   Upper Body Bathing: Set up;Sitting   Lower Body Bathing: Moderate assistance;Sitting/lateral leans   Upper Body Dressing : Set up;Sitting   Lower Body Dressing: Moderate assistance;Sitting/lateral leans   Toilet Transfer: Minimal assistance;BSC/3in1;Transfer board                  Extremity/Trunk Assessment Upper Extremity Assessment Upper Extremity Assessment: Generalized weakness RUE Deficits / Details: RCT but able to reach the top of his head RUE Sensation: WNL RUE Coordination: WNL LUE Deficits / Details: RCT but able to reach the top of his head, better AROM with the L LUE Sensation: WNL LUE Coordination: WNL   Lower Extremity Assessment Lower Extremity Assessment: Defer to PT evaluation        Vision Patient Visual Report: No change from baseline     Perception Perception Perception: Not tested   Praxis Praxis Praxis: Not tested    Cognition Arousal: Alert Behavior During Therapy: [facility] for tasks assessed/performed Overall Cognitive Status: Within Functional Limits for tasks assessed  General Comments  VSS on RA    Pertinent Vitals/ Pain       Pain Assessment Pain Assessment: Faces Faces Pain Scale: Hurts little more Pain Location: R LE Pain Descriptors / Indicators: Burning, Discomfort, Grimacing, Guarding Pain Intervention(s): Monitored during session                                                          Frequency  Min 1X/week         Progress Toward Goals  OT Goals(current goals can now be found in the care plan section)  Progress towards OT goals: Progressing toward goals  Acute Rehab OT Goals OT Goal Formulation: With patient Time For Goal Achievement: 08/24/23 Potential to Achieve Goals: Good  Plan      Co-evaluation                 AM-PAC OT "6 Clicks" Daily Activity     Outcome Measure   Help from another person eating meals?: None Help from another person taking care of personal grooming?: None Help from another person toileting, which includes using toliet, bedpan, or urinal?: A Little Help from another person bathing (including washing, rinsing, drying)?: A Lot Help from another person to put on and taking off regular upper body clothing?: None Help from another person to put on and taking off regular lower body clothing?: A Lot 6 Click Score: 19    End of Session    OT Visit Diagnosis: Unsteadiness on feet (R26.81);Muscle weakness (generalized) (M62.81);Pain Pain - Right/Left: Right Pain - part of body: Leg   Activity Tolerance Patient tolerated treatment well   Patient Left in bed;with call bell/phone within reach   Nurse Communication Mobility status        Time: 5956-3875 OT Time Calculation (min): 21 min  Charges: OT General Charges $OT Visit: 1 Visit OT Treatments $Self Care/Home Management : 8-22 mins  08/15/2023  RP, OTR/L  Acute Rehabilitation Services  Office:  [phone]   Suzanna Obey 08/15/2023, 9:47 AM

## 2023-08-15 NOTE — Progress Notes (Signed)
Inpatient Rehabilitation Admissions Coordinator   OT assessment noted. I will begin insurance Auth with [employer] medicare for possible CIR admit.  Ottie Glazier, RN, MSN Rehab Admissions Coordinator [phone] 08/15/2023 9:59 AM

## 2023-08-15 NOTE — Progress Notes (Signed)
[facility] [place]  Subjective  - no complaints   Objective 97/71 65 98.1 F (36.7 C) (Oral) 18 99%  Intake/Output Summary (Last 24 hours) at 08/15/2023 0644 Last data filed at 08/14/2023 1600 Gross per 24 hour  Intake 720 ml  Output 1100 ml  Net -380 ml    Right leg incisions clean dry and intact Right peroneal bypass with palpable pulse in the graft Brisk right peroneal signal  Laboratory Lab Results: No results for input(s): "WBC", "HGB", "HCT", "PLT" in the last 72 hours. BMET No results for input(s): "NA", "K", "CL", "CO2", "GLUCOSE", "BUN", "CREATININE", "CALCIUM" in the last 72 hours.  COAG Lab Results  Component Value Date   INR 1.2 08/09/2023   INR 1.1 12/05/2020   INR 1.1 04/24/2020   No results found for: "PTT"  Assessment/Planning:  Postop day 5 status post right common femoral to peroneal bypass for critical limb ischemia with tissue loss.  He had an excellent pulse in the bypass graft.  Brisk peroneal signal.  Aspirin Plavix statin for risk reduction.  Patient is interested in CIR and appreciate their input.  Awaiting insurance authorization.  Patient is appropriate for discharge to CIR if he gets bed availability.  [name] 08/15/2023 6:44 AM --

## 2023-08-16 ENCOUNTER — Inpatient Hospital Stay (HOSPITAL_COMMUNITY)
Admission: RE | Admit: 2023-08-16 | Discharge: 2023-08-24 | DRG: 945 | Disposition: A | Discharge status: Home Health | Payer: Medicare HMO | Source: Intra-hospital | Attending: Physical Medicine and Rehabilitation | Admitting: Physical Medicine and Rehabilitation

## 2023-08-16 ENCOUNTER — Encounter (HOSPITAL_COMMUNITY): Payer: Self-pay | Admitting: Physical Medicine and Rehabilitation

## 2023-08-16 ENCOUNTER — Other Ambulatory Visit: Payer: Self-pay

## 2023-08-16 DIAGNOSIS — Z7982 Long term (current) use of aspirin: Secondary | ICD-10-CM

## 2023-08-16 DIAGNOSIS — K219 Gastro-esophageal reflux disease without esophagitis: Secondary | ICD-10-CM | POA: Diagnosis present

## 2023-08-16 DIAGNOSIS — E669 Obesity, unspecified: Secondary | ICD-10-CM | POA: Diagnosis not present

## 2023-08-16 DIAGNOSIS — G4700 Insomnia, unspecified: Secondary | ICD-10-CM | POA: Diagnosis present

## 2023-08-16 DIAGNOSIS — Z794 Long term (current) use of insulin: Secondary | ICD-10-CM | POA: Diagnosis not present

## 2023-08-16 DIAGNOSIS — F17290 Nicotine dependence, other tobacco product, uncomplicated: Secondary | ICD-10-CM | POA: Diagnosis present

## 2023-08-16 DIAGNOSIS — Z7984 Long term (current) use of oral hypoglycemic drugs: Secondary | ICD-10-CM | POA: Diagnosis not present

## 2023-08-16 DIAGNOSIS — R5381 Other malaise: Secondary | ICD-10-CM | POA: Diagnosis not present

## 2023-08-16 DIAGNOSIS — I959 Hypotension, unspecified: Secondary | ICD-10-CM | POA: Diagnosis not present

## 2023-08-16 DIAGNOSIS — Z83438 Family history of other disorder of lipoprotein metabolism and other lipidemia: Secondary | ICD-10-CM

## 2023-08-16 DIAGNOSIS — K5900 Constipation, unspecified: Secondary | ICD-10-CM | POA: Diagnosis not present

## 2023-08-16 DIAGNOSIS — D649 Anemia, unspecified: Secondary | ICD-10-CM | POA: Diagnosis not present

## 2023-08-16 DIAGNOSIS — L89322 Pressure ulcer of left buttock, stage 2: Secondary | ICD-10-CM | POA: Diagnosis present

## 2023-08-16 DIAGNOSIS — Z89612 Acquired absence of left leg above knee: Secondary | ICD-10-CM | POA: Diagnosis not present

## 2023-08-16 DIAGNOSIS — E785 Hyperlipidemia, unspecified: Secondary | ICD-10-CM | POA: Diagnosis not present

## 2023-08-16 DIAGNOSIS — Z833 Family history of diabetes mellitus: Secondary | ICD-10-CM

## 2023-08-16 DIAGNOSIS — Z6829 Body mass index (BMI) 29.0-29.9, adult: Secondary | ICD-10-CM

## 2023-08-16 DIAGNOSIS — M545 Low back pain, unspecified: Secondary | ICD-10-CM | POA: Diagnosis present

## 2023-08-16 DIAGNOSIS — Z8249 Family history of ischemic heart disease and other diseases of the circulatory system: Secondary | ICD-10-CM | POA: Diagnosis not present

## 2023-08-16 DIAGNOSIS — Z79899 Other long term (current) drug therapy: Secondary | ICD-10-CM

## 2023-08-16 DIAGNOSIS — Z716 Tobacco abuse counseling: Secondary | ICD-10-CM

## 2023-08-16 DIAGNOSIS — I10 Essential (primary) hypertension: Secondary | ICD-10-CM | POA: Diagnosis not present

## 2023-08-16 DIAGNOSIS — Z800 Family history of malignant neoplasm of digestive organs: Secondary | ICD-10-CM | POA: Diagnosis not present

## 2023-08-16 DIAGNOSIS — E1152 Type 2 diabetes mellitus with diabetic peripheral angiopathy with gangrene: Secondary | ICD-10-CM | POA: Diagnosis present

## 2023-08-16 DIAGNOSIS — E7800 Pure hypercholesterolemia, unspecified: Secondary | ICD-10-CM

## 2023-08-16 DIAGNOSIS — M25512 Pain in left shoulder: Secondary | ICD-10-CM | POA: Diagnosis not present

## 2023-08-16 DIAGNOSIS — I739 Peripheral vascular disease, unspecified: Secondary | ICD-10-CM | POA: Diagnosis present

## 2023-08-16 DIAGNOSIS — E1142 Type 2 diabetes mellitus with diabetic polyneuropathy: Secondary | ICD-10-CM | POA: Diagnosis not present

## 2023-08-16 DIAGNOSIS — Z7902 Long term (current) use of antithrombotics/antiplatelets: Secondary | ICD-10-CM | POA: Diagnosis not present

## 2023-08-16 DIAGNOSIS — G8929 Other chronic pain: Secondary | ICD-10-CM | POA: Diagnosis present

## 2023-08-16 DIAGNOSIS — M62838 Other muscle spasm: Secondary | ICD-10-CM | POA: Diagnosis not present

## 2023-08-16 DIAGNOSIS — M25511 Pain in right shoulder: Secondary | ICD-10-CM | POA: Diagnosis not present

## 2023-08-16 DIAGNOSIS — G479 Sleep disorder, unspecified: Secondary | ICD-10-CM | POA: Diagnosis not present

## 2023-08-16 DIAGNOSIS — R0609 Other forms of dyspnea: Secondary | ICD-10-CM

## 2023-08-16 LAB — CBC
HCT: 32.8 % — ABNORMAL LOW (ref 39.0–52.0)
Hemoglobin: 9.3 g/dL — ABNORMAL LOW (ref 13.0–17.0)
MCH: 20.2 pg — ABNORMAL LOW (ref 26.0–34.0)
MCHC: 28.4 g/dL — ABNORMAL LOW (ref 30.0–36.0)
MCV: 71.1 fL — ABNORMAL LOW (ref 80.0–100.0)
Platelets: 318 10*3/uL (ref 150–400)
RBC: 4.61 MIL/uL (ref 4.22–5.81)
RDW: 28.3 % — ABNORMAL HIGH (ref 11.5–15.5)
WBC: 5 10*3/uL (ref 4.0–10.5)
nRBC: 0 % (ref 0.0–0.2)

## 2023-08-16 LAB — GLUCOSE, CAPILLARY
Glucose-Capillary: 108 mg/dL — ABNORMAL HIGH (ref 70–99)
Glucose-Capillary: 104 mg/dL — ABNORMAL HIGH (ref 70–99)
Glucose-Capillary: 117 mg/dL — ABNORMAL HIGH (ref 70–99)
Glucose-Capillary: 117 mg/dL — ABNORMAL HIGH (ref 70–99)

## 2023-08-16 LAB — CREATININE, SERUM
Creatinine, Ser: 0.84 mg/dL (ref 0.61–1.24)
GFR, Estimated: >60 mL/min (ref >60)

## 2023-08-16 MED ORDER — ATORVASTATIN CALCIUM 40 MG PO TABS
40.0000 mg | ORAL_TABLET | Freq: Every day | ORAL | Status: DC
  Administered 2023-08-17 – 2023-08-24 (×8): 40 mg via ORAL
  Filled 2023-08-16 (×8): qty 1

## 2023-08-16 MED ORDER — POLYETHYLENE GLYCOL 3350 17 GM/SCOOP PO POWD
17.0000 g | Freq: Every day | ORAL | Status: DC
  Administered 2023-08-19 – 2023-08-24 (×4): 17 g via ORAL
  Filled 2023-08-16: qty 255

## 2023-08-16 MED ORDER — NEBIVOLOL HCL 10 MG PO TABS
10.0000 mg | ORAL_TABLET | Freq: Every day | ORAL | Status: DC
  Administered 2023-08-17 – 2023-08-24 (×8): 10 mg via ORAL
  Filled 2023-08-16 (×8): qty 1

## 2023-08-16 MED ORDER — BACLOFEN 5 MG HALF TABLET
5.0000 mg | ORAL_TABLET | Freq: Two times a day (BID) | ORAL | Status: DC | PRN
  Administered 2023-08-18 – 2023-08-22 (×3): 5 mg via ORAL
  Filled 2023-08-16 (×3): qty 1

## 2023-08-16 MED ORDER — BISACODYL 5 MG PO TBEC: 5.0000 mg | DELAYED_RELEASE_TABLET | Freq: Every day | ORAL | Status: DC | PRN

## 2023-08-16 MED ORDER — ALUM & MAG HYDROXIDE-SIMETH 200-200-20 MG/5ML PO SUSP: 15.0000 mL | ORAL | Status: DC | PRN

## 2023-08-16 MED ORDER — HEPARIN SODIUM (PORCINE) 5000 UNIT/ML IJ SOLN: 5000.0000 [IU] | Freq: Three times a day (TID) | INTRAMUSCULAR | Status: DC

## 2023-08-16 MED ORDER — NEBIVOLOL HCL 10 MG PO TABS
20.0000 mg | ORAL_TABLET | Freq: Every day | ORAL | Status: DC
  Filled 2023-08-16: qty 2

## 2023-08-16 MED ORDER — INSULIN ASPART 100 UNIT/ML IJ SOLN
0.0000 [IU] | Freq: Three times a day (TID) | INTRAMUSCULAR | Status: DC
  Administered 2023-08-17 – 2023-08-20 (×4): 2 [IU] via SUBCUTANEOUS

## 2023-08-16 MED ORDER — TRAZODONE HCL 50 MG PO TABS
50.0000 mg | ORAL_TABLET | Freq: Every evening | ORAL | Status: DC | PRN
  Administered 2023-08-16: 50 mg via ORAL
  Filled 2023-08-16: qty 1

## 2023-08-16 MED ORDER — ACETAMINOPHEN 325 MG PO TABS
325.0000 mg | ORAL_TABLET | ORAL | Status: DC | PRN
  Administered 2023-08-16: 650 mg via ORAL
  Filled 2023-08-16: qty 2

## 2023-08-16 MED ORDER — ASPIRIN 81 MG PO TBEC
81.0000 mg | DELAYED_RELEASE_TABLET | Freq: Every day | ORAL | Status: DC
  Administered 2023-08-17 – 2023-08-24 (×8): 81 mg via ORAL
  Filled 2023-08-16 (×8): qty 1

## 2023-08-16 MED ORDER — OXYCODONE-ACETAMINOPHEN 5-325 MG PO TABS
1.0000 | ORAL_TABLET | ORAL | Status: DC | PRN
  Administered 2023-08-16 – 2023-08-22 (×26): 2 via ORAL
  Filled 2023-08-16 (×26): qty 2

## 2023-08-16 MED ORDER — EZETIMIBE 10 MG PO TABS
10.0000 mg | ORAL_TABLET | Freq: Every day | ORAL | Status: DC
  Administered 2023-08-17 – 2023-08-24 (×8): 10 mg via ORAL
  Filled 2023-08-16 (×8): qty 1

## 2023-08-16 MED ORDER — FLUOXETINE HCL 10 MG PO CAPS
10.0000 mg | ORAL_CAPSULE | Freq: Every day | ORAL | Status: DC
  Administered 2023-08-17 – 2023-08-24 (×8): 10 mg via ORAL
  Filled 2023-08-16 (×9): qty 1

## 2023-08-16 MED ORDER — GABAPENTIN 300 MG PO CAPS
300.0000 mg | ORAL_CAPSULE | Freq: Three times a day (TID) | ORAL | Status: DC
  Administered 2023-08-16 – 2023-08-23 (×20): 300 mg via ORAL
  Filled 2023-08-16 (×20): qty 1

## 2023-08-16 MED ORDER — PANTOPRAZOLE SODIUM 40 MG PO TBEC
40.0000 mg | DELAYED_RELEASE_TABLET | Freq: Every day | ORAL | Status: DC
  Administered 2023-08-17 – 2023-08-24 (×8): 40 mg via ORAL
  Filled 2023-08-16 (×8): qty 1

## 2023-08-16 MED ORDER — NICOTINE POLACRILEX 2 MG MT GUM: 2.0000 mg | CHEWING_GUM | OROMUCOSAL | Status: DC | PRN

## 2023-08-16 MED ORDER — METFORMIN HCL 500 MG PO TABS
1000.0000 mg | ORAL_TABLET | Freq: Two times a day (BID) | ORAL | Status: DC
  Administered 2023-08-16 – 2023-08-24 (×16): 1000 mg via ORAL
  Filled 2023-08-16 (×16): qty 2

## 2023-08-16 MED ORDER — DOCUSATE SODIUM 100 MG PO CAPS
100.0000 mg | ORAL_CAPSULE | Freq: Every day | ORAL | Status: DC
  Administered 2023-08-16 – 2023-08-24 (×9): 100 mg via ORAL
  Filled 2023-08-16 (×9): qty 1

## 2023-08-16 MED ORDER — ALBUTEROL SULFATE (2.5 MG/3ML) 0.083% IN NEBU: 2.5000 mg | INHALATION_SOLUTION | Freq: Four times a day (QID) | RESPIRATORY_TRACT | Status: DC | PRN

## 2023-08-16 MED ORDER — ACETAMINOPHEN 650 MG RE SUPP: 325.0000 mg | RECTAL | Status: DC | PRN

## 2023-08-16 MED ORDER — GUAIFENESIN-DM 100-10 MG/5ML PO SYRP
15.0000 mL | ORAL_SOLUTION | ORAL | Status: DC | PRN
  Administered 2023-08-22: 15 mL via ORAL
  Filled 2023-08-16: qty 20

## 2023-08-16 MED ORDER — CLOPIDOGREL BISULFATE 75 MG PO TABS
75.0000 mg | ORAL_TABLET | Freq: Every day | ORAL | Status: DC
  Administered 2023-08-17 – 2023-08-24 (×8): 75 mg via ORAL
  Filled 2023-08-16 (×8): qty 1

## 2023-08-16 MED ORDER — HEPARIN SODIUM (PORCINE) 5000 UNIT/ML IJ SOLN
5000.0000 [IU] | Freq: Three times a day (TID) | INTRAMUSCULAR | Status: DC
  Administered 2023-08-16 – 2023-08-17 (×2): 5000 [IU] via SUBCUTANEOUS
  Filled 2023-08-16 (×2): qty 1

## 2023-08-16 NOTE — Plan of Care (Signed)
  Problem: Education: Goal: Understanding of CV disease, CV risk reduction, and recovery process will improve Outcome: Progressing   Problem: Activity: Goal: Ability to return to baseline activity level will improve Outcome: Progressing   Problem: Cardiovascular: Goal: Ability to achieve and maintain adequate cardiovascular perfusion will improve Outcome: Progressing Goal: Vascular access site(s) Level 0-1 will be maintained Outcome: Progressing   Problem: Health Behavior/Discharge Planning: Goal: Ability to safely manage health-related needs after discharge will improve Outcome: Progressing   Problem: Coping: Goal: Ability to adjust to condition or change in health will improve Outcome: Progressing   Problem: Fluid Volume: Goal: Ability to maintain a balanced intake and output will improve Outcome: Progressing   Problem: Health Behavior/Discharge Planning: Goal: Ability to manage health-related needs will improve Outcome: Progressing   Problem: Metabolic: Goal: Ability to maintain appropriate glucose levels will improve Outcome: Progressing   Problem: Nutritional: Goal: Maintenance of adequate nutrition will improve Outcome: Progressing Goal: Progress toward achieving an optimal weight will improve Outcome: Progressing

## 2023-08-16 NOTE — Progress Notes (Addendum)
  Progress Note    08/16/2023 7:34 AM 7 Days Post-Op  Subjective:  no complaints    Vitals:   08/16/23 0327 08/16/23 0716  BP: 112/75 114/62  Pulse:  82  Resp: 13 19  Temp: 98.5 F (36.9 C) 98.1 F (36.7 C)  SpO2:  96%    Physical Exam: Cardiac:  NSR on the monitor Lungs:  nonlabored Incisions:  right groin and lower extremity incisions dry and intact Extremities:  brisk right peroneal doppler signal with palpable pulse in the bypass  CBC    Component Value Date/Time   WBC 12.9 (H) 08/12/2023 0018   RBC 4.62 08/12/2023 0018   HGB 9.0 (L) 08/12/2023 0018   HGB 9.9 (L) 02/23/2023 1210   HCT 32.6 (L) 08/12/2023 0018   HCT 34.6 (L) 02/23/2023 1210   PLT 316 08/12/2023 0018   PLT 362 02/23/2023 1210   MCV 70.6 (L) 08/12/2023 0018   MCV 67 (L) 02/23/2023 1210   MCH 19.5 (L) 08/12/2023 0018   MCHC 27.6 (L) 08/12/2023 0018   RDW 27.3 (H) 08/12/2023 0018   RDW 18.3 (H) 02/23/2023 1210   LYMPHSABS 1.7 03/03/2022 1615   MONOABS 1.1 (H) 02/20/2021 0150   EOSABS 0.2 03/03/2022 1615   BASOSABS 0.1 03/03/2022 1615    BMET    Component Value Date/Time   NA 134 (L) 08/12/2023 0018   NA 136 02/23/2023 1210   K 4.9 08/12/2023 0018   CL 98 08/12/2023 0018   CO2 27 08/12/2023 0018   GLUCOSE 120 (H) 08/12/2023 0018   BUN 19 08/12/2023 0018   BUN 8 02/23/2023 1210   CREATININE 0.77 08/12/2023 0018   CREATININE 0.94 03/31/2015 1454   CALCIUM 8.3 (L) 08/12/2023 0018   GFRNONAA >60 08/12/2023 0018   GFRAA 106 11/30/2020 0944    INR    Component Value Date/Time   INR 1.2 08/09/2023 0700     Intake/Output Summary (Last 24 hours) at 08/16/2023 0734 Last data filed at 08/16/2023 0500 Gross per 24 hour  Intake 960 ml  Output 650 ml  Net 310 ml      Assessment/Plan:  72 y.o. male is 7 days post op, s/p:  right common femoral to peroneal bypass   -Making progress with PT/OT in mobilization, limited by weakness and pain -Right lower extremity incisions are clean,  dry, and intact -Right foot is warm and well perfused with a brisk peroneal signal. Palpable pulse in the bypass graft -Continue ASA, plavix, and statin -Pending insurance approval for CIR   Bobbie Stack [facility] [phone] 08/16/2023 7:34 AM  I have seen and evaluated the patient. I agree with the PA note as documented above.  Postop day 7 status post right common femoral to peroneal artery bypass.  Has a brisk palpable pulse in the bypass graft.  Brisk peroneal signal at the ankle.  All of his incisions look good.  Aspirin Plavix statin for risk reduction.  Hopefully plan rehab if we can get insurance approval.  Will allow his toes to demarcate and these have been followed by podiatry in the past.  Cephus Shelling, MD [facility]: [phone]

## 2023-08-16 NOTE — Progress Notes (Signed)
EOS: RLE edematous +1-2, swollen, warm, pink. R posterior tibial faint on doppler; R dorsalis pedis very faint with doppler.

## 2023-08-16 NOTE — Progress Notes (Signed)
Inpatient Rehabilitation  Patient information reviewed and entered into eRehab system by Melissa M. Bowie, M.A., CCC/SLP, PPS Coordinator.  Information including medical coding, functional ability and quality indicators will be reviewed and updated through discharge.    

## 2023-08-16 NOTE — H&P (Signed)
Physical Medicine and Rehabilitation Admission H&P   CC: Debility 2/2 redo exposure of right common femoral artery harvest of right lower extremity great saphenous vein with right common femoral artery to peroneal artery bypass with ipsilateral reversed great saphenous vein  HPI: [name] is a 72 year old right-handed male with history of ACDF 03/08/2012, of hyperlipidemia, hypertension, tobacco use diabetes mellitus/PAD with multiple revascularization procedures including right EIA stenting 2018 and right femoral endarterectomy April 2021, left AKA 02/19/2021 per Dr. Lenell Antu.  Per chart review patient lives with spouse.  1 level home with a ramped entrance.  Primarily uses a wheelchair in the home due to right leg giving out when attempting walking with left lower extremity prosthesis.  Presented 08/09/2023 with significant pain of the right foot x 3 weeks as well as discoloration which progressed to some wounds on the toes and significant pain in the foot.  He was seen by podiatrist Dr. Annamary Rummage on 7/26 who recommended urgent evaluation with vascular surgery concerns of ischemia of right foot.  He has known SFA occlusion with single-vessel peroneal runoff.  ABIs decreased from prior study 6 months ago.  Patient underwent redo exposure of right common femoral artery harvest of right lower extremity great saphenous vein with right common femoral artery to peroneal artery bypass with ipsilateral reversed great saphenous vein 08/09/2023 per Dr. Chestine Spore.  Patient remains on low-dose aspirin and Plavix as prior to admission.  Subcutaneous heparin added for DVT prophylaxis.  Acute on chronic anemia latest hemoglobin 9.0.  Therapy evaluations completed due to patient decreased functional mobility was admitted for a comprehensive rehab program.  Review of Systems  Constitutional:  Negative for chills and fever.  HENT:  Negative for hearing loss.   Eyes:  Negative for blurred vision and double vision.   Respiratory:  Negative for cough and wheezing.        Shortness of breath with exertion  Cardiovascular:  Positive for leg swelling. Negative for chest pain and palpitations.  Gastrointestinal:  Positive for constipation. Negative for heartburn and nausea.       GERD  Genitourinary:  Negative for dysuria, flank pain and hematuria.  Musculoskeletal:  Positive for joint pain and myalgias.  Skin:  Negative for rash.  Neurological:  Positive for sensory change.  Psychiatric/Behavioral:  Positive for depression.   All other systems reviewed and are negative.  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   Critical lower limb ischemia (HCC) 12/19/2019   Diabetes mellitus    takes Metformin daily   GERD (gastroesophageal reflux disease)    takes Protonix daily   History of blood transfusion    "related to low HgB" ((09/10/2015   Hyperlipidemia    takes Vytorin daily   Hypertension    takes Benazepril and Bystolic daily   PAD (peripheral artery disease) (HCC)    Pneumonia    Radiculopathy, cervical region 02/11/2017   Shortness of breath dyspnea    with exertion   Tobacco user    Toe fracture, right 05/09/2011   Wears glasses    Past Surgical History:  Procedure Laterality Date   ABDOMINAL AORTOGRAM W/LOWER EXTREMITY N/A 12/11/2017   Procedure:  ABDOMINAL AORTOGRAM W/LOWER EXTREMITY;  Surgeon: Chuck Hint, MD;  Location[facility] INVASIVE CV LAB;  Service: Cardiovascular;  Laterality: N/A;   ABDOMINAL AORTOGRAM W/LOWER EXTREMITY Bilateral 04/22/2020   Procedure: ABDOMINAL AORTOGRAM W/LOWER EXTREMITY;  Surgeon: Cephus Shelling, MD;  Location: [facility] INVASIVE CV LAB;  Service:  Cardiovascular;  Laterality: Bilateral;   ABDOMINAL AORTOGRAM W/LOWER EXTREMITY N/A 07/27/2023   Procedure: ABDOMINAL AORTOGRAM W/LOWER EXTREMITY;  Surgeon: Cephus Shelling, MD;  Location: [facility] INVASIVE CV LAB;  Service: Cardiovascular;  Laterality: N/A;   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   AMPUTATION Left 02/19/2021   Procedure: LEFT ABOVE KNEE AMPUTATION;  Surgeon: Leonie Douglas, MD;  Location: [facility] OR;  Service: Vascular;  Laterality: Left;   ANTERIOR CERVICAL DECOMP/DISCECTOMY FUSION  03/08/12   C6-7   ANTERIOR CERVICAL DECOMP/DISCECTOMY FUSION  03/08/2012   Procedure: ANTERIOR CERVICAL DECOMPRESSION/DISCECTOMY FUSION 1 LEVEL/HARDWARE REMOVAL;  Surgeon: Maeola Harman, MD;  Location: [facility] NEURO ORS;  Service: Neurosurgery;  Laterality: N/A;  revison of C5-7 anterior cervical decompression with fusion with Cervical Five-Thoracic One anterior cervical decompression with fusion with interbody prothesis plating and bonegraft   BACK SURGERY  1996   BIOPSY  12/05/2020   Procedure: BIOPSY;  Surgeon: Meryl Dare, MD;  Location: [facility] ENDOSCOPY;  Service: Endoscopy;;   BYPASS GRAFT FEMORAL-PERONEAL Left 11/11/2016   Procedure: REDO LEFT FEMORAL-PERONEAL BYPASS WITH PROPATEN X 80CM GRAFT;  Surgeon: Chuck Hint, MD;  Location: [facility] OR;  Service: Vascular;  Laterality: Left;   BYPASS GRAFT FEMORAL-PERONEAL Right 08/09/2023   Procedure: RIGHT  FEMORAL  ARTERY TO PERONEAL ARTERY BYPASS GRAFT USING GREATER SAPHENOUS VEIN;  Surgeon: Cephus Shelling, MD;  Location: [facility] OR;  Service: Vascular;  Laterality: Right;   COLONOSCOPY W/ BIOPSIES AND POLYPECTOMY  08/17/2012   f/u 5 years, 4 polyps, no high grade dysplasia or malignancy, tubular adenoma, hyperplastic polyops   COLONOSCOPY WITH PROPOFOL N/A 12/05/2020   Procedure: COLONOSCOPY WITH PROPOFOL;  Surgeon: Meryl Dare, MD;  Location: [facility] ENDOSCOPY;  Service: Endoscopy;  Laterality: N/A;   EMBOLECTOMY Left 12/19/2019   Procedure: LEFT  FEMERAL- PERONEAL THROMBECTOMY;  Surgeon: Cephus Shelling, MD;  Location: [facility] OR;  Service: Vascular;  Laterality: Left;   ENDARTERECTOMY FEMORAL Right 04/24/2020   Procedure: RIGHT FEMORAL ENDARTERECTOMY;  Surgeon: Cephus Shelling, MD;  Location: [facility] OR;  Service: Vascular;  Laterality: Right;   ENTEROSCOPY N/A 12/11/2015   Procedure: ENTEROSCOPY;  Surgeon: Jeani Hawking, MD;  Location: [facility] ENDOSCOPY;  Service: Endoscopy;  Laterality: N/A;   ENTEROSCOPY N/A 03/29/2018   Procedure: ENTEROSCOPY;  Surgeon: Jeani Hawking, MD;  Location: [facility] ENDOSCOPY;  Service: Endoscopy;  Laterality: N/A;   ESOPHAGOGASTRODUODENOSCOPY  08/17/2012   normal esophagus and GEJ, diffuse gastritis with erythema- no malignancy, reactive gastropathy  with focal intestinal metaplasia   ESOPHAGOGASTRODUODENOSCOPY (EGD) WITH PROPOFOL N/A 12/05/2020   Procedure: ESOPHAGOGASTRODUODENOSCOPY (EGD) WITH PROPOFOL;  Surgeon: Meryl Dare, MD;  Location: [facility] ENDOSCOPY;  Service: Endoscopy;  Laterality: N/A;   FEMORAL-POPLITEAL BYPASS GRAFT Left 01/06/2016   Procedure: Left  COMMON FEMORAL-BELOW KNEE POPLITEAL ARTERY Bypass using non-reversed translocated saphenous vein graft from left leg;  Surgeon: Pryor Ochoa, MD;  Location: [facility] OR;  Service: Vascular;  Laterality: Left;   GIVENS CAPSULE STUDY N/A 11/24/2015   Procedure: GIVENS CAPSULE STUDY;  Surgeon: Charna Elizabeth, MD;  Location: [facility] ENDOSCOPY;  Service: Endoscopy;  Laterality: N/A;   HOT HEMOSTASIS N/A 03/29/2018   Procedure: HOT HEMOSTASIS (ARGON PLASMA COAGULATION/BICAP);  Surgeon:  Jeani Hawking, MD;  Location: [facility] ENDOSCOPY;  Service: Endoscopy;  Laterality: N/A;   INGUINAL HERNIA REPAIR  1990's   right   INTRAOPERATIVE ARTERIOGRAM Left 01/06/2016   Procedure: INTRA OPERATIVE ARTERIOGRAM LEFT LOWER LEG;  Surgeon: Pryor Ochoa, MD;  Location: [facility] OR;  Service: Vascular;  Laterality: Left;   INTRAOPERATIVE ARTERIOGRAM Left 11/11/2016   Procedure: INTRA OPERATIVE ARTERIOGRAM  LEFT LOWER EXTRIMITY;  Surgeon: Chuck Hint, MD;  Location: [facility] OR;  Service: Vascular;  Laterality: Left;   IR ANGIOGRAM FOLLOW UP STUDY  12/11/2017   IR GENERIC HISTORICAL  10/24/2016   IR ANGIOGRAM FOLLOW UP STUDY   LOWER EXTREMITY ANGIOGRAM Bilateral 07/30/2015   Procedure: Lower Extremity Angiogram;  Surgeon: Fransisco Hertz, MD;  Location: [facility] INVASIVE CV LAB;  Service: Cardiovascular;  Laterality: Bilateral;   LUMBAR DISC SURGERY  1996   "lower"   PATCH ANGIOPLASTY Left 12/19/2019   Procedure: LEFT FEMORAL -PERONEAL PATCH ANGIOPLASTY WITH XENOSURE BIOLOGIC PATCH  ;  Surgeon: Cephus Shelling, MD;  Location: [facility] OR;  Service: Vascular;  Laterality: Left;   PATCH ANGIOPLASTY Right 04/24/2020   Procedure: Patch Angioplasty of Right Femoral Artery using Long Xenosure Biologic Patch 1cm x 14 cm;  Surgeon: Cephus Shelling, MD;  Location: [facility] OR;  Service: Vascular;  Laterality: Right;   PERIPHERAL VASCULAR CATHETERIZATION N/A 07/30/2015   Procedure: Abdominal Aortogram;  Surgeon: Fransisco Hertz, MD;  Location: [facility] INVASIVE CV LAB;  Service: Cardiovascular;  Laterality: N/A;   PERIPHERAL VASCULAR CATHETERIZATION N/A 10/24/2016   Procedure: Abdominal Aortogram;  Surgeon: Chuck Hint, MD;  Location: [facility] INVASIVE CV LAB;  Service: Cardiovascular;  Laterality: N/A;   PERIPHERAL VASCULAR CATHETERIZATION N/A 10/24/2016   Procedure: Lower Extremity Angiography;  Surgeon: Chuck Hint, MD;  Location: [facility] INVASIVE CV LAB;  Service: Cardiovascular;  Laterality: N/A;   PERIPHERAL VASCULAR INTERVENTION Right 12/11/2017   Procedure: PERIPHERAL VASCULAR INTERVENTION;  Surgeon: Chuck Hint, MD;  Location: [facility] INVASIVE CV LAB;  Service: Cardiovascular;  Laterality: Right;   PERIPHERAL VASCULAR INTERVENTION Right 07/27/2023   Procedure: PERIPHERAL VASCULAR INTERVENTION;  Surgeon: Cephus Shelling, MD;  Location: [facility] INVASIVE CV LAB;  Service: Cardiovascular;  Laterality: Right;   Common Illiac   VASCULAR SURGERY  ~ 2007   Stent SFA    VEIN HARVEST Left 01/06/2016   Procedure: LEFT GREATER SAPPHENOUS VEIN HARVEST;  Surgeon: Pryor Ochoa, MD;  Location: [facility] OR;  Service: Vascular;  Laterality: Left;   VEIN HARVEST Right 08/09/2023   Procedure: RIGHT GREATER SAPHENOUS VEIN HARVEST;  Surgeon: Cephus Shelling, MD;  Location: [facility] OR;  Service: Vascular;  Laterality: Right;   Family History  Problem Relation Age of Onset   Cancer Mother        colon Cancer   Hyperlipidemia Mother    Diabetes Mother    Heart disease Father    Hypertension Father    Cancer Sister        Uterine   Diabetes Sister    Diabetes Brother    Social History:  reports that he has been smoking cigars. He has been exposed to tobacco smoke. He has never used smokeless tobacco. He reports current alcohol use of about 2.0 standard drinks of alcohol per week. He reports that he does not use drugs. Allergies:  Allergies  Allergen Reactions   Glipizide Other (See Comments)    REACTION IS SIDE EFFECT Severe hypoglycemia to 40s.    Medications Prior to Admission  Medication  Sig Dispense Refill   albuterol (VENTOLIN HFA) 108 (90 Base) MCG/ACT inhaler TAKE 2 PUFFS BY MOUTH EVERY 6 HOURS AS NEEDED FOR WHEEZE OR SHORTNESS OF BREATH 8.5 each 1   amLODipine (NORVASC) 5 MG tablet Take 1 tablet (5 mg total) by mouth at bedtime. 90 tablet 3   aspirin EC 81 MG tablet Take 1 tablet (81 mg total) by mouth daily. Swallow whole.     atorvastatin (LIPITOR) 40 MG tablet TAKE 1 TABLET BY MOUTH EVERY DAY 90 tablet 1   benazepril (LOTENSIN) 5 MG tablet TAKE 1 TABLET BY MOUTH EVERY DAY 90 tablet 3   clopidogrel (PLAVIX) 75 MG tablet TAKE 1 TABLET BY MOUTH EVERY DAY 90 tablet 0   ezetimibe (ZETIA) 10 MG tablet TAKE 1 TABLET BY MOUTH EVERY DAY 90 tablet 1   FLUoxetine (PROZAC) 10 MG capsule TAKE 1 CAPSULE BY MOUTH EVERY DAY 90 capsule 3   gabapentin (NEURONTIN) 100 MG capsule Take 1 capsule (100 mg total) by mouth 3  (three) times daily. (Patient taking differently: Take 100 mg by mouth at bedtime.) 90 capsule 3   gabapentin (NEURONTIN) 300 MG capsule TAKE 1 CAPSULE BY MOUTH THREE TIMES A DAY AS NEEDED 90 capsule 1   metFORMIN (GLUCOPHAGE) 1000 MG tablet TAKE 1 TABLET BY MOUTH TWICE A DAY WITH FOOD 180 tablet 1   Nebivolol HCl 20 MG TABS TAKE 1 TABLET BY MOUTH EVERY DAY 90 tablet 3   nicotine polacrilex (NICORETTE) 2 MG gum Take 1 each (2 mg total) by mouth as needed for smoking cessation. 100 tablet 3   oxyCODONE-acetaminophen (PERCOCET) 5-325 MG tablet Take 1 tablet by mouth every 4 (four) hours as needed for severe pain. 30 tablet 0   pantoprazole (PROTONIX) 40 MG tablet TAKE 1 TABLET BY MOUTH EVERY DAY AS NEEDED 90 tablet 1   polyethylene glycol powder (GLYCOLAX/MIRALAX) 17 GM/SCOOP powder Take 17 g by mouth daily. (Patient not taking: Reported on 08/08/2023) 500 g 0   Home: Home Living Family/patient expects to be discharged to:: Private residence Living Arrangements: Spouse/significant other Available Help at Discharge: Family, Available 24 hours/day Type of Home: House Home Access: Ramped entrance Home Layout: One level Bathroom Shower/Tub: Engineer, manufacturing systems: Standard Bathroom Accessibility: Yes Home Equipment: Tub bench, Art gallery manager, Wheelchair - manual, Agricultural consultant (2 wheels), BSC/3in1   Functional History: Prior Function Prior Level of Function : Independent/Modified Independent Mobility Comments: using wheelchair in the home due to R leg giving out when attempting walking with prosthetic. ADLs Comments: mod I with showering with tub bench   Functional Status:  Mobility: Bed Mobility Overal bed mobility: Needs Assistance Bed Mobility: Sit to Supine Supine to sit: HOB elevated, Used rails, Contact guard Sit to supine: Supervision General bed mobility comments: increased time and effort, but pt able to perform without assist Transfers Overall transfer level: Needs  assistance Equipment used: None Transfers: Bed to chair/wheelchair/BSC Bed to/from chair/wheelchair/BSC transfer type:: Lateral/scoot transfer Squat pivot transfers: Mod assist, +2 physical assistance  Lateral/Scoot Transfers: Min assist, Contact guard assist General transfer comment: Pt able to lateral scoot into recliner with initial min A with bedpad progressing to CGA. cues for anterior lean and increased bottom clearance Ambulation/Gait General Gait Details: unable baseline   ADL: ADL Overall ADL's : Needs assistance/impaired Eating/Feeding: Independent, Sitting Grooming: Set up, Sitting Upper Body Bathing: Set up, Sitting Lower Body Bathing: Moderate assistance, Sitting/lateral leans Upper Body Dressing : Set up, Sitting Lower Body Dressing: Moderate assistance, Sitting/lateral leans Toilet  Transfer: Minimal assistance, BSC/3in1, Transfer board   Cognition: Cognition Overall Cognitive Status: Within Functional Limits for tasks assessed Orientation Level: Oriented X4 Cognition Arousal: Alert Behavior During Therapy: WFL for tasks assessed/performed Overall Cognitive Status: Within Functional Limits for tasks assessed    Physical Exam: There were no vitals taken for this visit. Gen: no distress, normal appearing HEENT: oral mucosa pink and moist, NCAT Cardio: Reg rate Chest: normal effort, normal rate of breathing Abd: soft, non-distended Ext: no edema Psych: pleasant, normal affect Skin:    Comments: Right peroneal bypass site with palpable pulses at the graft.  Left AKA well-healed  Neurological:     Comments: Patient is alert oriented x 3 and follows commands   Results for orders placed or performed during the hospital encounter of 08/09/23 (from the past 48 hour(s))  Glucose, capillary     Status: Abnormal   Collection Time: 08/14/23  4:14 PM  Result Value Ref Range   Glucose-Capillary 137 (H) 70 - 99 mg/dL    Comment: Glucose reference range applies only to  samples taken after fasting for at least 8 hours.  Glucose, capillary     Status: Abnormal   Collection Time: 08/14/23  9:31 PM  Result Value Ref Range   Glucose-Capillary 111 (H) 70 - 99 mg/dL    Comment: Glucose reference range applies only to samples taken after fasting for at least 8 hours.  Glucose, capillary     Status: Abnormal   Collection Time: 08/15/23  6:09 AM  Result Value Ref Range   Glucose-Capillary 114 (H) 70 - 99 mg/dL    Comment: Glucose reference range applies only to samples taken after fasting for at least 8 hours.  Glucose, capillary     Status: None   Collection Time: 08/15/23 11:15 AM  Result Value Ref Range   Glucose-Capillary 95 70 - 99 mg/dL    Comment: Glucose reference range applies only to samples taken after fasting for at least 8 hours.  Glucose, capillary     Status: Abnormal   Collection Time: 08/15/23  4:53 PM  Result Value Ref Range   Glucose-Capillary 114 (H) 70 - 99 mg/dL    Comment: Glucose reference range applies only to samples taken after fasting for at least 8 hours.  Glucose, capillary     Status: Abnormal   Collection Time: 08/15/23  9:49 PM  Result Value Ref Range   Glucose-Capillary 122 (H) 70 - 99 mg/dL    Comment: Glucose reference range applies only to samples taken after fasting for at least 8 hours.  Glucose, capillary     Status: Abnormal   Collection Time: 08/16/23  5:51 AM  Result Value Ref Range   Glucose-Capillary 108 (H) 70 - 99 mg/dL    Comment: Glucose reference range applies only to samples taken after fasting for at least 8 hours.  Glucose, capillary     Status: Abnormal   Collection Time: 08/16/23 11:27 AM  Result Value Ref Range   Glucose-Capillary 104 (H) 70 - 99 mg/dL    Comment: Glucose reference range applies only to samples taken after fasting for at least 8 hours.   *Note: Due to a large number of results and/or encounters for the requested time period, some results have not been displayed. A complete set of  results can be found in Results Review.   No results found.    There were no vitals taken for this visit.  Medical Problem List and Plan: 1. Functional deficits secondary  to debility related to PAD status post right common femoral to peroneal bypass for critical limb ischemia with tissue loss 08/09/2023 per Dr. Chestine Spore as well as history of left AKA 2022.  -patient may shower but incisions must be covered  -ELOS/Goals: modI 7-10 days  Admit to CIR 2.  Antithrombotics: -DVT/anticoagulation:  Pharmaceutical: Heparin  -antiplatelet therapy: Aspirin 81 mg daily and Plavix 75 mg daily 3. Pain Management: Neurontin 300 mg 3 times daily, baclofen 5 mg twice daily as needed, oxycodone as needed 4. Mood/Behavior/Sleep: Prozac 10 mg daily.  Provide emotional support  -antipsychotic agents: N/A 5. Neuropsych/cognition: This patient is capable of making decisions on his own behalf. 6. Skin/Wound Care: Routine skin checks 7. Fluids/Electrolytes/Nutrition: Routine in and outs with follow-up chemistries 8.  Acute on chronic anemia.  Follow-up CBC 9.  Diabetes mellitus with peripheral neuropathy.  Hemoglobin A1c 6.4.  Glucophage 1000 mg twice daily. 10.  Hyperlipidemia.  Lipitor/Zetia 11.  Hypertension.  Reduce Bystolic to 10mg  daily given hypotension. Monitor with increased mobility 12.  GERD.  Continue Protonix 13.  Obesity.  BMI 29.66.  Dietary follow-up 14.  Tobacco abuse.  Nicorette gum as needed.  Provide counseling 15.  Constipation.  Continue MiraLAX daily, Colace 100 mg daily  I have personally performed a face to face diagnostic evaluation, including, but not limited to relevant history and physical exam findings, of this patient and developed relevant assessment and plan.  Additionally, I have reviewed and concur with the physician assistant's documentation above.  Mcarthur Rossetti Angiulli, PA-C   Horton Chin, MD 08/16/2023

## 2023-08-16 NOTE — H&P (Incomplete)
Physical Medicine and Rehabilitation Admission H&P     HPI: [name]. [name] is a 72 year old right-handed male with history of ACDF 03/08/2012, of hyperlipidemia, hypertension, tobacco use diabetes mellitus/PAD with multiple revascularization procedures including right EIA stenting 2018 and right femoral endarterectomy April 2021, left AKA 02/19/2021 per Dr. Lenell Antu.  Per chart review patient lives with spouse.  1 level home with a ramped entrance.  Primarily uses a wheelchair in the home due to right leg giving out when attempting walking with left lower extremity prosthesis.  Presented 08/09/2023 with significant pain of the right foot x 3 weeks as well as discoloration which progressed to some wounds on the toes and significant pain in the foot.  He was seen by podiatrist Dr. Annamary Rummage on 7/26 who recommended urgent evaluation with vascular surgery concerns of ischemia of right foot.  He has known SFA occlusion with single-vessel peroneal runoff.  ABIs decreased from prior study 6 months ago.  Patient underwent redo exposure of right common femoral artery harvest of right lower extremity great saphenous vein with right common femoral artery to peroneal artery bypass with ipsilateral reversed great saphenous vein 08/09/2023 per Dr. Chestine Spore.  Patient remains on low-dose aspirin and Plavix as prior to admission.  Subcutaneous heparin added for DVT prophylaxis.  Acute on chronic anemia latest hemoglobin 9.0.  Therapy evaluations completed due to patient decreased functional mobility was admitted for a comprehensive rehab program.  Review of Systems  Constitutional:  Negative for chills and fever.  HENT:  Negative for hearing loss.   Eyes:  Negative for blurred vision and double vision.  Respiratory:  Negative for cough and wheezing.        Shortness of breath with exertion  Cardiovascular:  Positive for leg swelling. Negative for chest pain and palpitations.  Gastrointestinal:  Positive for constipation.  Negative for heartburn and nausea.       GERD  Genitourinary:  Negative for dysuria, flank pain and hematuria.  Musculoskeletal:  Positive for joint pain and myalgias.  Skin:  Negative for rash.  Neurological:  Positive for sensory change.  Psychiatric/Behavioral:  Positive for depression.   All other systems reviewed and are negative.  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   Critical lower limb ischemia (HCC) 12/19/2019   Diabetes mellitus    takes Metformin daily   GERD (gastroesophageal reflux disease)    takes Protonix daily   History of blood transfusion    "related to low HgB" ((09/10/2015   Hyperlipidemia    takes Vytorin daily   Hypertension    takes Benazepril and Bystolic daily   PAD (peripheral artery disease) (HCC)    Pneumonia    Radiculopathy, cervical region 02/11/2017   Shortness of breath dyspnea    with exertion   Tobacco user    Toe fracture, right 05/09/2011   Wears glasses    Past Surgical History:  Procedure Laterality Date   ABDOMINAL AORTOGRAM W/LOWER EXTREMITY N/A 12/11/2017   Procedure: ABDOMINAL AORTOGRAM W/LOWER EXTREMITY;  Surgeon: Chuck Hint, MD;  Location: [facility] INVASIVE CV LAB;  Service: Cardiovascular;  Laterality: N/A;   ABDOMINAL AORTOGRAM W/LOWER EXTREMITY Bilateral 04/22/2020  Procedure: ABDOMINAL AORTOGRAM W/LOWER EXTREMITY;  Surgeon: Cephus Shelling, MD;  Location: [facility] INVASIVE CV LAB;  Service: Cardiovascular;  Laterality: Bilateral;   ABDOMINAL AORTOGRAM W/LOWER EXTREMITY N/A 07/27/2023   Procedure: ABDOMINAL AORTOGRAM W/LOWER EXTREMITY;  Surgeon: Cephus Shelling, MD;  Location: [facility] INVASIVE CV LAB;  Service: Cardiovascular;   Laterality: N/A;   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   AMPUTATION Left 02/19/2021   Procedure: LEFT ABOVE KNEE AMPUTATION;  Surgeon: Leonie Douglas, MD;  Location: [facility] OR;  Service: Vascular;  Laterality: Left;   ANTERIOR CERVICAL DECOMP/DISCECTOMY FUSION  03/08/12   C6-7   ANTERIOR CERVICAL DECOMP/DISCECTOMY FUSION  03/08/2012   Procedure: ANTERIOR CERVICAL DECOMPRESSION/DISCECTOMY FUSION 1 LEVEL/HARDWARE REMOVAL;  Surgeon: Maeola Harman, MD;  Location: [facility] NEURO ORS;  Service: Neurosurgery;  Laterality: N/A;  revison of C5-7 anterior cervical decompression with fusion with Cervical Five-Thoracic One anterior cervical decompression with fusion with interbody prothesis plating and bonegraft   BACK SURGERY  1996   BIOPSY  12/05/2020   Procedure: BIOPSY;  Surgeon: Meryl Dare, MD;  Location: [facility] ENDOSCOPY;  Service: Endoscopy;;   BYPASS GRAFT FEMORAL-PERONEAL Left 11/11/2016   Procedure: REDO LEFT FEMORAL-PERONEAL BYPASS WITH PROPATEN X 80CM GRAFT;  Surgeon: Chuck Hint, MD;  Location: [facility] OR;  Service: Vascular;  Laterality: Left;   BYPASS GRAFT FEMORAL-PERONEAL Right 08/09/2023   Procedure: RIGHT  FEMORAL  ARTERY TO PERONEAL ARTERY BYPASS GRAFT USING GREATER SAPHENOUS VEIN;  Surgeon: Cephus Shelling, MD;  Location: [facility] OR;  Service: Vascular;  Laterality: Right;   COLONOSCOPY W/ BIOPSIES AND POLYPECTOMY  08/17/2012   f/u 5 years, 4 polyps, no high grade dysplasia or malignancy, tubular adenoma, hyperplastic polyops   COLONOSCOPY WITH PROPOFOL N/A 12/05/2020   Procedure: COLONOSCOPY WITH PROPOFOL;  Surgeon: Meryl Dare, MD;  Location: [facility] ENDOSCOPY;  Service: Endoscopy;  Laterality: N/A;   EMBOLECTOMY Left 12/19/2019   Procedure: LEFT FEMERAL- PERONEAL THROMBECTOMY;  Surgeon: Cephus Shelling, MD;  Location: [facility] OR;  Service: Vascular;  Laterality: Left;   ENDARTERECTOMY FEMORAL Right 04/24/2020   Procedure: RIGHT FEMORAL ENDARTERECTOMY;  Surgeon: Cephus Shelling, MD;  Location: [facility] OR;  Service: Vascular;  Laterality: Right;   ENTEROSCOPY N/A 12/11/2015   Procedure: ENTEROSCOPY;  Surgeon: Jeani Hawking, MD;  Location: [facility] ENDOSCOPY;  Service: Endoscopy;  Laterality: N/A;   ENTEROSCOPY N/A 03/29/2018   Procedure: ENTEROSCOPY;  Surgeon: Jeani Hawking, MD;  Location: [facility] ENDOSCOPY;  Service: Endoscopy;  Laterality: N/A;   ESOPHAGOGASTRODUODENOSCOPY  08/17/2012   normal esophagus and GEJ, diffuse gastritis with erythema- no malignancy, reactive gastropathy  with focal intestinal metaplasia   ESOPHAGOGASTRODUODENOSCOPY (EGD) WITH PROPOFOL N/A 12/05/2020   Procedure: ESOPHAGOGASTRODUODENOSCOPY (EGD) WITH PROPOFOL;  Surgeon: Meryl Dare, MD;  Location: [facility] ENDOSCOPY;  Service: Endoscopy;  Laterality: N/A;   FEMORAL-POPLITEAL BYPASS GRAFT Left 01/06/2016   Procedure: Left  COMMON FEMORAL-BELOW KNEE POPLITEAL ARTERY Bypass using non-reversed translocated saphenous vein graft from left leg;  Surgeon: Pryor Ochoa, MD;  Location: [facility] OR;  Service: Vascular;  Laterality: Left;   GIVENS CAPSULE STUDY N/A 11/24/2015   Procedure: GIVENS CAPSULE STUDY;  Surgeon: Charna Elizabeth, MD;  Location: [facility] ENDOSCOPY;  Service: Endoscopy;  Laterality: N/A;   HOT HEMOSTASIS N/A 03/29/2018   Procedure: HOT HEMOSTASIS (ARGON PLASMA COAGULATION/BICAP);  Surgeon: Jeani Hawking, MD;  Location: [facility] ENDOSCOPY;  Service: Endoscopy;  Laterality: N/A;   INGUINAL HERNIA REPAIR  1990's   right   INTRAOPERATIVE ARTERIOGRAM Left 01/06/2016   Procedure:  INTRA OPERATIVE ARTERIOGRAM LEFT LOWER LEG;  Surgeon: Pryor Ochoa, MD;  Location: [facility] OR;  Service: Vascular;  Laterality: Left;   INTRAOPERATIVE ARTERIOGRAM Left 11/11/2016   Procedure: INTRA OPERATIVE ARTERIOGRAM LEFT LOWER EXTRIMITY;  Surgeon: Chuck Hint, MD;  Location: [facility] OR;  Service: Vascular;  Laterality: Left;   IR ANGIOGRAM FOLLOW UP STUDY  12/11/2017   IR GENERIC HISTORICAL  10/24/2016   IR ANGIOGRAM FOLLOW UP STUDY   LOWER  EXTREMITY ANGIOGRAM Bilateral 07/30/2015   Procedure: Lower Extremity Angiogram;  Surgeon: Fransisco Hertz, MD;  Location: [facility] INVASIVE CV LAB;  Service: Cardiovascular;  Laterality: Bilateral;   LUMBAR DISC SURGERY  1996   "lower"   PATCH ANGIOPLASTY Left 12/19/2019   Procedure: LEFT FEMORAL -PERONEAL PATCH ANGIOPLASTY WITH XENOSURE BIOLOGIC PATCH  ;  Surgeon: Cephus Shelling, MD;  Location: [facility] OR;  Service: Vascular;  Laterality: Left;   PATCH ANGIOPLASTY Right 04/24/2020   Procedure: Patch Angioplasty of Right Femoral Artery using Long Xenosure Biologic Patch 1cm x 14 cm;  Surgeon: Cephus Shelling, MD;  Location: [facility] OR;  Service: Vascular;  Laterality: Right;   PERIPHERAL VASCULAR CATHETERIZATION N/A 07/30/2015   Procedure: Abdominal Aortogram;  Surgeon: Fransisco Hertz, MD;  Location: [facility] INVASIVE CV LAB;  Service: Cardiovascular;  Laterality: N/A;   PERIPHERAL VASCULAR CATHETERIZATION N/A 10/24/2016   Procedure: Abdominal Aortogram;  Surgeon: Chuck Hint, MD;  Location: [facility] INVASIVE CV LAB;  Service: Cardiovascular;  Laterality: N/A;   PERIPHERAL VASCULAR CATHETERIZATION N/A 10/24/2016   Procedure: Lower Extremity Angiography;  Surgeon: Chuck Hint, MD;  Location: [facility] INVASIVE CV LAB;  Service: Cardiovascular;  Laterality: N/A;   PERIPHERAL VASCULAR INTERVENTION Right 12/11/2017   Procedure: PERIPHERAL VASCULAR INTERVENTION;  Surgeon: Chuck Hint, MD;  Location: [facility] INVASIVE CV LAB;  Service: Cardiovascular;  Laterality: Right;   PERIPHERAL VASCULAR INTERVENTION Right 07/27/2023   Procedure: PERIPHERAL VASCULAR INTERVENTION;  Surgeon: Cephus Shelling, MD;  Location: [facility] INVASIVE CV LAB;  Service: Cardiovascular;  Laterality: Right;  Common Illiac   VASCULAR SURGERY  ~ 2007   Stent SFA    VEIN HARVEST Left 01/06/2016   Procedure: LEFT GREATER SAPPHENOUS VEIN HARVEST;  Surgeon: Pryor Ochoa, MD;  Location: [facility] OR;  Service: Vascular;  Laterality: Left;   VEIN  HARVEST Right 08/09/2023   Procedure: RIGHT GREATER SAPHENOUS VEIN HARVEST;  Surgeon: Cephus Shelling, MD;  Location: [facility] OR;  Service: Vascular;  Laterality: Right;   Family History  Problem Relation Age of Onset   Cancer Mother        colon Cancer   Hyperlipidemia Mother    Diabetes Mother    Heart disease Father    Hypertension Father    Cancer Sister        Uterine   Diabetes Sister    Diabetes Brother    Social History:  reports that he has been smoking cigars. He has been exposed to tobacco smoke. He has never used smokeless tobacco. He reports current alcohol use of about 2.0 standard drinks of alcohol per week. He reports that he does not use drugs. Allergies:  Allergies  Allergen Reactions   Glipizide Other (See Comments)    REACTION IS SIDE EFFECT Severe hypoglycemia to 40s.    Medications Prior to Admission  Medication Sig Dispense Refill   albuterol (VENTOLIN HFA) 108 (90 Base) MCG/ACT inhaler TAKE 2 PUFFS BY MOUTH EVERY 6 HOURS AS NEEDED FOR WHEEZE OR SHORTNESS OF BREATH 8.5 each 1  amLODipine (NORVASC) 5 MG tablet Take 1 tablet (5 mg total) by mouth at bedtime. 90 tablet 3   aspirin EC 81 MG tablet Take 1 tablet (81 mg total) by mouth daily. Swallow whole.     atorvastatin (LIPITOR) 40 MG tablet TAKE 1 TABLET BY MOUTH EVERY DAY 90 tablet 1   benazepril (LOTENSIN) 5 MG tablet TAKE 1 TABLET BY MOUTH EVERY DAY 90 tablet 3   clopidogrel (PLAVIX) 75 MG tablet TAKE 1 TABLET BY MOUTH EVERY DAY 90 tablet 0   ezetimibe (ZETIA) 10 MG tablet TAKE 1 TABLET BY MOUTH EVERY DAY 90 tablet 1   FLUoxetine (PROZAC) 10 MG capsule TAKE 1 CAPSULE BY MOUTH EVERY DAY 90 capsule 3   gabapentin (NEURONTIN) 100 MG capsule Take 1 capsule (100 mg total) by mouth 3 (three) times daily. (Patient taking differently: Take 100 mg by mouth at bedtime.) 90 capsule 3   gabapentin (NEURONTIN) 300 MG capsule TAKE 1 CAPSULE BY MOUTH THREE TIMES A DAY AS NEEDED 90 capsule 1   metFORMIN (GLUCOPHAGE) 1000  MG tablet TAKE 1 TABLET BY MOUTH TWICE A DAY WITH FOOD 180 tablet 1   Nebivolol HCl 20 MG TABS TAKE 1 TABLET BY MOUTH EVERY DAY 90 tablet 3   nicotine polacrilex (NICORETTE) 2 MG gum Take 1 each (2 mg total) by mouth as needed for smoking cessation. 100 tablet 3   oxyCODONE-acetaminophen (PERCOCET) 5-325 MG tablet Take 1 tablet by mouth every 4 (four) hours as needed for severe pain. 30 tablet 0   pantoprazole (PROTONIX) 40 MG tablet TAKE 1 TABLET BY MOUTH EVERY DAY AS NEEDED 90 tablet 1   polyethylene glycol powder (GLYCOLAX/MIRALAX) 17 GM/SCOOP powder Take 17 g by mouth daily. (Patient not taking: Reported on 08/08/2023) 500 g 0      Home: Home Living Family/patient expects to be discharged to:: Private residence Living Arrangements: Spouse/significant other Available Help at Discharge: Family, Available 24 hours/day Type of Home: House Home Access: Ramped entrance Home Layout: One level Bathroom Shower/Tub: Engineer, manufacturing systems: Standard Bathroom Accessibility: Yes Home Equipment: Tub bench, Art gallery manager, Wheelchair - manual, Agricultural consultant (2 wheels), BSC/3in1   Functional History: Prior Function Prior Level of Function : Independent/Modified Independent Mobility Comments: using wheelchair in the home due to R leg giving out when attempting walking with prosthetic. ADLs Comments: mod I with showering with tub bench  Functional Status:  Mobility: Bed Mobility Overal bed mobility: Needs Assistance Bed Mobility: Sit to Supine Supine to sit: HOB elevated, Used rails, Contact guard Sit to supine: Supervision General bed mobility comments: increased time and effort, but pt able to perform without assist Transfers Overall transfer level: Needs assistance Equipment used: None Transfers: Bed to chair/wheelchair/BSC Bed to/from chair/wheelchair/BSC transfer type:: Lateral/scoot transfer Squat pivot transfers: Mod assist, +2 physical assistance  Lateral/Scoot  Transfers: Min assist, Contact guard assist General transfer comment: Pt able to lateral scoot into recliner with initial min A with bedpad progressing to CGA. cues for anterior lean and increased bottom clearance Ambulation/Gait General Gait Details: unable baseline    ADL: ADL Overall ADL's : Needs assistance/impaired Eating/Feeding: Independent, Sitting Grooming: Set up, Sitting Upper Body Bathing: Set up, Sitting Lower Body Bathing: Moderate assistance, Sitting/lateral leans Upper Body Dressing : Set up, Sitting Lower Body Dressing: Moderate assistance, Sitting/lateral leans Toilet Transfer: Minimal assistance, BSC/3in1, Transfer board  Cognition: Cognition Overall Cognitive Status: Within Functional Limits for tasks assessed Orientation Level: Oriented X4 Cognition Arousal: Alert Behavior During Therapy: [facility] for tasks  assessed/performed Overall Cognitive Status: Within Functional Limits for tasks assessed  Physical Exam: Blood pressure 112/75, pulse 77, temperature 98.5 F (36.9 C), temperature source Oral, resp. rate 13, height 5\' 7"  (1.702 m), weight 85.9 kg, SpO2 94%. Physical Exam Skin:    Comments: Right peroneal bypass site with palpable pulses at the graft.  Left AKA well-healed  Neurological:     Comments: Patient is alert oriented x 3 and follows commands     Results for orders placed or performed during the hospital encounter of 08/09/23 (from the past 48 hour(s))  Glucose, capillary     Status: Abnormal   Collection Time: 08/14/23 10:04 AM  Result Value Ref Range   Glucose-Capillary 112 (H) 70 - 99 mg/dL    Comment: Glucose reference range applies only to samples taken after fasting for at least 8 hours.  Glucose, capillary     Status: Abnormal   Collection Time: 08/14/23 12:52 PM  Result Value Ref Range   Glucose-Capillary 130 (H) 70 - 99 mg/dL    Comment: Glucose reference range applies only to samples taken after fasting for at least 8 hours.   Glucose, capillary     Status: Abnormal   Collection Time: 08/14/23  4:14 PM  Result Value Ref Range   Glucose-Capillary 137 (H) 70 - 99 mg/dL    Comment: Glucose reference range applies only to samples taken after fasting for at least 8 hours.  Glucose, capillary     Status: Abnormal   Collection Time: 08/14/23  9:31 PM  Result Value Ref Range   Glucose-Capillary 111 (H) 70 - 99 mg/dL    Comment: Glucose reference range applies only to samples taken after fasting for at least 8 hours.  Glucose, capillary     Status: Abnormal   Collection Time: 08/15/23  6:09 AM  Result Value Ref Range   Glucose-Capillary 114 (H) 70 - 99 mg/dL    Comment: Glucose reference range applies only to samples taken after fasting for at least 8 hours.  Glucose, capillary     Status: None   Collection Time: 08/15/23 11:15 AM  Result Value Ref Range   Glucose-Capillary 95 70 - 99 mg/dL    Comment: Glucose reference range applies only to samples taken after fasting for at least 8 hours.  Glucose, capillary     Status: Abnormal   Collection Time: 08/15/23  4:53 PM  Result Value Ref Range   Glucose-Capillary 114 (H) 70 - 99 mg/dL    Comment: Glucose reference range applies only to samples taken after fasting for at least 8 hours.  Glucose, capillary     Status: Abnormal   Collection Time: 08/15/23  9:49 PM  Result Value Ref Range   Glucose-Capillary 122 (H) 70 - 99 mg/dL    Comment: Glucose reference range applies only to samples taken after fasting for at least 8 hours.  Glucose, capillary     Status: Abnormal   Collection Time: 08/16/23  5:51 AM  Result Value Ref Range   Glucose-Capillary 108 (H) 70 - 99 mg/dL    Comment: Glucose reference range applies only to samples taken after fasting for at least 8 hours.   *Note: Due to a large number of results and/or encounters for the requested time period, some results have not been displayed. A complete set of results can be found in Results Review.   No  results found.    Blood pressure 112/75, pulse 77, temperature 98.5 F (36.9 C), temperature source Oral,  resp. rate 13, height 5\' 7"  (1.702 m), weight 85.9 kg, SpO2 94%.  Medical Problem List and Plan: 1. Functional deficits secondary to debility related to PAD status post right common femoral to peroneal bypass for critical limb ischemia with tissue loss 08/09/2023 per Dr. Chestine Spore as well as history of left AKA 2022.  -patient may *** shower  -ELOS/Goals: *** 2.  Antithrombotics: -DVT/anticoagulation:  Pharmaceutical: Heparin  -antiplatelet therapy: Aspirin 81 mg daily and Plavix 75 mg daily 3. Pain Management: Neurontin 300 mg 3 times daily, baclofen 5 mg twice daily as needed, oxycodone as needed 4. Mood/Behavior/Sleep: Prozac 10 mg daily.  Provide emotional support  -antipsychotic agents: N/A 5. Neuropsych/cognition: This patient is capable of making decisions on his own behalf. 6. Skin/Wound Care: Routine skin checks 7. Fluids/Electrolytes/Nutrition: Routine in and outs with follow-up chemistries 8.  Acute on chronic anemia.  Follow-up CBC 9.  Diabetes mellitus with peripheral neuropathy.  Hemoglobin A1c 6.4.  Glucophage 1000 mg twice daily. 10.  Hyperlipidemia.  Lipitor/Zetia 11.  Hypertension.  Bystolic 20 mg daily.  Monitor with increased mobility 12.  GERD.  Protonix 13.  Obesity.  BMI 29.66.  Dietary follow-up 14.  Tobacco abuse.  Nicorette gum as needed.  Provide counseling 15.  Constipation.  MiraLAX daily, Colace 100 mg daily    Charlton Amor, PA-C 08/16/2023

## 2023-08-16 NOTE — Progress Notes (Signed)
Beryle Beams  Rehab Admission Coordinator Physical Medicine and Rehabilitation   PMR Pre-admission    Incomplete   Date of Service: 08/14/2023  1:19 PM   Incomplete     Expand All Collapse All  Show:Clear all [x] Written[x] Templated[x] Copied  Added by: [x] Beryle Beams  [] Hover for details PMR Admission Coordinator Pre-Admission Assessment   Patient: [name] is an 72 y.o., male MRN: [id number] DOB: [birth year] Height: 5\' 7"  (170.2 cm) Weight: 85.9 kg                                                                                                                                                  Insurance Information HMO: yes    PPO:      PCP:      IPA:      80/20:      OTHER:  PRIMARY: [employer] Medicare      Policy#: [id number]      Subscriber: patient CM Name: Brie      Phone#: [phone]     Fax#: [phone] Pre-Cert#: [id number]   approved 8/21 until 9/2 with updates due 9/3   Employer:  Benefits:  Phone #: [phone]     Name: 8/19 Eff. Date: 12/26/2022-still active     Deduct: $240  Out of Pocket Max: $8.850 met)        CIR: $2,080 co/pay admission      SNF: $0/day co pay for days 1-20, $204/day co pay for days 21-100  Outpatient: 80% coverage; 20% co-insurance     Home Health: 80% coverage; 20% co-insurance       DME: 80% coverage; 20% co-insurance     Providers: In network  SECONDARY; Medicaid [place] access policy # [id number] o active 8/21 per passport one source [facility]   The "Data Collection Information Summary" for patients in Inpatient Rehabilitation Facilities with attached "Privacy Act Statement-Health Care Records" was provided and verbally reviewed with: Patient and Family   Emergency Contact Information Contact Information       Name Relation Home Work Mobile    Purcell Spouse     [phone]         Other Contacts       Name Relation Home Work Mobile    Bodey,Garland Brother [phone]   [phone]     Alphonsa Gin [phone]   [phone]         Current Medical History  Patient Admitting Diagnosis: Critical limb ischemia of the R LE with tissue loss.     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 Lenell Antu.  Primarily uses a wheelchair in the home due to right leg giving out when attempting walking with left  lower extremity prosthesis. Presented 08/09/2023 with significant pain of the right foot x 3 weeks as well as discoloration which progressed to some wounds on the toes and significant pain in the foot.   He was seen by podiatrist Dr. Annamary Rummage on 7/26 who recommended urgent evaluation with vascular surgery concerns of ischemia of right foot. He has known SFA occlusion with single-vessel peroneal runoff. ABIs decreased from prior study 6 months ago. Patient underwent redo exposure of right common femoral artery harvest of right lower extremity great saphenous vein with right common femoral artery to peroneal artery bypass with ipsilateral reversed great saphenous vein 08/09/2023 per Dr. Chestine Spore. Patient remains on low-dose aspirin and Plavix as prior to admission. Subcutaneous heparin added for DVT prophylaxis. Acute on chronic anemia latest hemoglobin 9.0.   Patient's medical record from [facility] has been reviewed by the rehabilitation admission coordinator and physician.   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   Critical lower limb ischemia (HCC) 12/19/2019   Diabetes mellitus      takes Metformin daily   GERD (gastroesophageal reflux disease)      takes Protonix daily   History of blood transfusion      "related to low HgB" ((09/10/2015   Hyperlipidemia      takes Vytorin daily   Hypertension      takes Benazepril and Bystolic daily   PAD (peripheral artery disease) (HCC)     Pneumonia     Radiculopathy, cervical region 02/11/2017   Shortness of breath dyspnea      with exertion   Tobacco user     Toe fracture, right 05/09/2011   Wears glasses           Has the patient had major surgery during 100 days prior to admission? Yes   Family History  family history includes Cancer in his mother and sister; Diabetes in his brother, mother, and sister; Heart disease in his father; Hyperlipidemia in his mother; Hypertension in his father.    Current Medications   Current Medications    Current Facility-Administered Medications:    0.9 %  sodium chloride infusion, 500 mL, Intravenous, Once PRN, Rhyne, Samantha J, PA-C   acetaminophen (TYLENOL) tablet 325-650 mg, 325-650 mg, Oral, Q4H PRN, 650 mg at 08/13/23 1939 **OR** acetaminophen (TYLENOL) suppository 325-650 mg, 325-650 mg, Rectal, Q4H PRN, Rhyne, Samantha J, PA-C   albuterol (PROVENTIL) (2.5 MG/3ML) 0.083% nebulizer solution 2.5 mg, 2.5 mg, Nebulization, Q6H PRN, Rhyne, Samantha J, PA-C   alum & mag hydroxide-simeth (MAALOX/MYLANTA) 200-200-20 MG/5ML suspension 15-30 mL, 15-30 mL, Oral, Q2H PRN, Rhyne, Samantha J, PA-C   aspirin EC tablet 81 mg, 81 mg, Oral, Daily, Rhyne, Samantha J, PA-C, 81 mg at 08/14/23 1005   atorvastatin (LIPITOR) tablet 40 mg, 40 mg, Oral, Daily, Rhyne, Samantha J, PA-C, 40 mg at 08/14/23 1005   baclofen (LIORESAL) tablet 5 mg, 5 mg, Oral, BID PRN, Rhyne, Samantha J, PA-C, 5 mg at 08/14/23 1005   bisacodyl (DULCOLAX) EC tablet 5 mg, 5 mg, Oral, Daily PRN, Rhyne, Samantha J, PA-C, 5 mg at  08/14/23 1005   Chlorhexidine Gluconate Cloth 2 % PADS 6 each, 6 each, Topical, Q0600, Cephus Shelling, MD, 6 each at 08/14/23 1006   clopidogrel (PLAVIX) tablet 75 mg, 75 mg, Oral, Daily, Rhyne, Samantha J, PA-C, 75 mg at 08/14/23 1006   docusate sodium (COLACE) capsule 100 mg, 100 mg, Oral, Daily, Rhyne, Samantha J, PA-C, 100 mg at 08/14/23 1005   ezetimibe (ZETIA) tablet 10 mg, 10 mg, Oral, Daily, Rhyne, Samantha J, PA-C, 10 mg at 08/14/23 1005   FLUoxetine (PROZAC) capsule 10 mg, 10 mg, Oral, Daily, Rhyne, Samantha J, PA-C, 10 mg at 08/14/23 1015   gabapentin (NEURONTIN) capsule 300 mg, 300 mg, Oral, TID, Rhyne, Samantha J, PA-C, 300 mg at 08/14/23 1005   guaiFENesin-dextromethorphan (ROBITUSSIN DM) 100-10 MG/5ML syrup 15 mL, 15 mL, Oral, Q4H PRN, Rhyne, Samantha J, PA-C   heparin injection 5,000 Units, 5,000 Units, Subcutaneous, Q8H, Rhyne, Samantha J, PA-C, 5,000 Units at 08/14/23 0544   hydrALAZINE (APRESOLINE) injection 5 mg, 5 mg, Intravenous, Q20 Min PRN, Rhyne, Samantha J, PA-C   HYDROmorphone (DILAUDID) injection 0.5-1 mg, 0.5-1 mg, Intravenous, Q2H PRN, Rhyne, Samantha J, PA-C, 1 mg at 08/14/23 0809   insulin aspart (novoLOG) injection 0-15 Units, 0-15 Units, Subcutaneous, TID WC, Rhyne, Samantha J, PA-C, 2 Units at 08/14/23 1302   labetalol (NORMODYNE) injection 10 mg, 10 mg, Intravenous, Q10 min PRN, Rhyne, Samantha J, PA-C   lactated ringers infusion, , Intravenous, Continuous, Schuh, McKenzi P, PA-C, Stopped at 08/13/23 0221   magnesium sulfate IVPB 2 g 50 mL, 2 g, Intravenous, Daily PRN, Rhyne, Samantha J, PA-C   metFORMIN (GLUCOPHAGE) tablet 1,000 mg, 1,000 mg, Oral, BID WC, Rhyne, Samantha J, PA-C, 1,000 mg at 08/14/23 1005   metoprolol tartrate (LOPRESSOR) injection 2-5 mg, 2-5 mg, Intravenous, Q2H PRN, Rhyne, Samantha J, PA-C   nebivolol (BYSTOLIC) tablet 20 mg, 20 mg, Oral, Daily, Rhyne, Samantha J, PA-C, 20 mg at 08/14/23 1015   nicotine polacrilex (NICORETTE) gum 2 mg,  2 mg, Oral, PRN, Rhyne, Samantha J, PA-C   ondansetron (ZOFRAN) injection 4 mg, 4 mg, Intravenous, Q6H PRN, Rhyne, Samantha J, PA-C   Oral care mouth rinse, 15 mL, Mouth Rinse, PRN, Rhyne, Samantha J, PA-C   oxyCODONE-acetaminophen (PERCOCET/ROXICET) 5-325 MG per tablet 1-2 tablet, 1-2 tablet, Oral, Q4H PRN, Rhyne, Samantha J, PA-C, 2 tablet at 08/14/23 1005   pantoprazole (PROTONIX) EC tablet 40 mg, 40 mg, Oral, Daily PRN, Rhyne, Samantha J, PA-C   pantoprazole (PROTONIX) EC tablet 40 mg, 40 mg, Oral, Daily, Rhyne, Samantha J, PA-C, 40 mg at 08/14/23 1006   phenol (CHLORASEPTIC) mouth spray 1 spray, 1 spray, Mouth/Throat, PRN, Rhyne, Samantha J, PA-C   polyethylene glycol powder (GLYCOLAX/MIRALAX) container 17 g, 17 g, Oral, Daily, Rhyne, Samantha J, PA-C, 17 g at 08/14/23 1014   potassium chloride SA (KLOR-CON M) CR tablet 20-40 mEq, 20-40 mEq, Oral, Daily PRN, Rhyne, Samantha J, PA-C    Patients Current Diet:  Diet Order                  Diet heart healthy/carb modified Room service appropriate? No; Fluid consistency: Thin  Diet effective now                        Precautions / Restrictions Precautions Precautions: Fall Precaution Comments: L AKA Restrictions Weight Bearing Restrictions: No    Has the patient had 2 or more falls or a fall with injury in the past year?No  Prior Activity Level   Prior Functional Level Prior Function Prior Level of Function : Independent/Modified Independent Mobility Comments: using wheelchair in the home due to R leg giving out when attempting walking with prosthetic. ADLs Comments: mod I with showering with tub bench   Self Care: Did the patient need help bathing, dressing, using the toilet or eating?  Independent   Indoor Mobility: Did the patient need assistance with walking from room to room (with or without device)? Dependent   Stairs: Did the patient need assistance with internal or external stairs (with or without device)?  Dependent   Functional Cognition: Did the patient need help planning regular tasks such as shopping or remembering to take medications? Independent   Patient Information Are you of Hispanic, Latino/a,or Spanish origin?: A. No, not of Hispanic, Latino/a, or Spanish origin What is your race?: B. Black or African American   Patient's Response To:  Health Literacy and Transportation Is the patient able to respond to health literacy and transportation needs?: Yes   Home Assistive Devices / Equipment Home Assistive Devices/Equipment: Fish farm manager, Wheelchair Home Equipment: Tub bench, Art gallery manager, Wheelchair - manual, Agricultural consultant (2 wheels), BSC/3in1   Prior Device Use: Indicate devices/aids used by the patient prior to current illness, exacerbation or injury? Manual wheelchair, Motorized wheelchair or scooter   Current Functional Level Cognition   Overall Cognitive Status: Within Functional Limits for tasks assessed Orientation Level: Oriented X4    Extremity Assessment (includes Sensation/Coordination)   Upper Extremity Assessment: Right hand dominant, RUE deficits/detail, LUE deficits/detail, Generalized weakness RUE Deficits / Details: RCT but able to reach the top of his head RUE Sensation: WNL RUE Coordination: WNL LUE Deficits / Details: RCT but able to reach the top of his head, better AROM with the L LUE Sensation: WNL LUE Coordination: WNL  Lower Extremity Assessment: Defer to PT evaluation RLE Deficits / Details: ankle AROM WFL, incision down medial aspect of leg and thigh and in groin and painful with flexion though able to flex to approx 70 in sitting     ADLs   Overall ADL's : Needs assistance/impaired Eating/Feeding: Independent, Sitting Grooming: Set up, Sitting Upper Body Bathing: Set up, Sitting Lower Body Bathing: Moderate assistance, Sitting/lateral leans Upper Body Dressing : Set up, Sitting Lower Body Dressing: Moderate assistance, Sitting/lateral  leans Toilet Transfer: Moderate assistance, +2 for physical assistance     Mobility   Overal bed mobility: Needs Assistance Bed Mobility: Supine to Sit Supine to sit: HOB elevated, Used rails, Contact guard Sit to supine: Mod assist General bed mobility comments: increased time and effort, but pt able to perform without assist     Transfers   Overall transfer level: Needs assistance Equipment used: None Transfers: Bed to chair/wheelchair/BSC Bed to/from chair/wheelchair/BSC transfer type:: Lateral/scoot transfer Squat pivot transfers: Mod assist, +2 physical assistance  Lateral/Scoot Transfers: Min assist, Contact guard assist General transfer comment: Pt able to lateral scoot into recliner with initial min A with bedpad progressing to CGA. cues for anterior lean and increased bottom clearance     Ambulation / Gait / Stairs / Wheelchair Mobility   Ambulation/Gait General Gait Details: unable baseline     Posture / Balance Dynamic Sitting Balance Sitting balance - Comments: sitting EOB Balance Overall balance assessment: Needs assistance Sitting-balance support: Feet supported Sitting balance-Leahy Scale: Fair Sitting balance - Comments: sitting EOB Standing balance comment: NT High Level Balance Comments: Sitting EOB: Reahcing forward and to side bil UE x 5, limited shoudler ROM so  encoruaged trunk lean but pt not able to go outside BOS.  Trunk rotation to tolerance with assist x 5.     Special needs/care consideration  wound to anterior right non healing wound RLE      Previous Home Environment  Living Arrangements: Spouse/significant other Available Help at Discharge: Family, Available 24 hours/day Type of Home: House Home Layout: One level Home Access: Ramped entrance Bathroom Shower/Tub: Engineer, manufacturing systems: Standard Bathroom Accessibility: Yes How Accessible: Accessible via walker, Accessible via wheelchair Home Care Services: No   Discharge Living  Setting Plans for Discharge Living Setting: Patient's home, Lives with (comment) (wife) Type of Home at Discharge: House Discharge Home Layout: One level Discharge Home Access: Ramped entrance Discharge Bathroom Shower/Tub: Tub/shower unit Discharge Bathroom Toilet: Standard Discharge Bathroom Accessibility: Yes Does the patient have any problems obtaining your medications?: No   Social/Family/Support Systems Anticipated Caregiver: wife Anticipated Industrial/product designer Information: [phone]    Goals Patient/Family Goal for Rehab: Mod I PT, OT Expected length of stay: 7-10 days Program Orientation Provided & Reviewed with Pt/Caregiver Including Roles  & Responsibilities: Yes  Barriers to Discharge: Insurance for SNF coverage    Decrease burden of Care through IP rehab admission: n/a    Possible need for SNF placement upon discharge:not anticipated    Patient Condition: This patient's medical and functional status has changed since the consult dated: 08/14/23 in which the Rehabilitation Physician determined and documented that the patient's condition is appropriate for intensive rehabilitative care in an inpatient rehabilitation facility. See "History of Present Illness" (above) for medical update. Functional changes are: mod assist overa;;. Patient's medical and functional status update has been discussed with the Rehabilitation physician and patient remains appropriate for inpatient rehabilitation. Will admit to inpatient rehab today.   Preadmission Screen Completed By:  Ottie Glazier RN MSN 08/16/23 at 0813 ______________________________________________________________________   Discussed status with Dr. Carlis Abbott on 08/16/23 at 1021 and received approval for admission today.   Admission Coordinator:  Ottie Glazier RN MSN time [id number] Date 08/16/2023

## 2023-08-16 NOTE — Progress Notes (Signed)
Inpatient Rehabilitation Admissions Coordinator   I have insurance approval and CIR bed to admit him to today. I met with patient at bedside. I reviewed estimated cost of care for Cir admit. He will ask his wife to bring clothes and his prosthetic leg tomorrow. I will alert acute team and TOC and make the arrangements to admit today.  Ottie Glazier, RN, MSN Rehab Admissions Coordinator [phone] 08/16/2023 10:17 AM

## 2023-08-17 DIAGNOSIS — I739 Peripheral vascular disease, unspecified: Secondary | ICD-10-CM | POA: Diagnosis not present

## 2023-08-17 LAB — GLUCOSE, CAPILLARY
Glucose-Capillary: 110 mg/dL — ABNORMAL HIGH (ref 70–99)
Glucose-Capillary: 128 mg/dL — ABNORMAL HIGH (ref 70–99)
Glucose-Capillary: 114 mg/dL — ABNORMAL HIGH (ref 70–99)
Glucose-Capillary: 135 mg/dL — ABNORMAL HIGH (ref 70–99)

## 2023-08-17 LAB — CBC WITH DIFFERENTIAL/PLATELET
Abs Immature Granulocytes: 0.04 10*3/uL (ref 0.00–0.07)
Basophils Absolute: 0 10*3/uL (ref 0.0–0.1)
Basophils Relative: 1 %
Eosinophils Absolute: 0.1 10*3/uL (ref 0.0–0.5)
Eosinophils Relative: 3 %
HCT: 33.5 % — ABNORMAL LOW (ref 39.0–52.0)
Hemoglobin: 9.4 g/dL — ABNORMAL LOW (ref 13.0–17.0)
Immature Granulocytes: 1 %
Lymphocytes Relative: 26 %
Lymphs Abs: 1.1 10*3/uL (ref 0.7–4.0)
MCH: 19.8 pg — ABNORMAL LOW (ref 26.0–34.0)
MCHC: 28.1 g/dL — ABNORMAL LOW (ref 30.0–36.0)
MCV: 70.5 fL — ABNORMAL LOW (ref 80.0–100.0)
Monocytes Absolute: 0.6 10*3/uL (ref 0.1–1.0)
Monocytes Relative: 15 %
Neutro Abs: 2.3 10*3/uL (ref 1.7–7.7)
Neutrophils Relative %: 54 %
Platelets: 296 10*3/uL (ref 150–400)
RBC: 4.75 MIL/uL (ref 4.22–5.81)
RDW: 28 % — ABNORMAL HIGH (ref 11.5–15.5)
WBC: 4.2 10*3/uL (ref 4.0–10.5)
nRBC: 0 % (ref 0.0–0.2)

## 2023-08-17 LAB — COMPREHENSIVE METABOLIC PANEL
ALT: 16 U/L (ref 0–44)
AST: 23 U/L (ref 15–41)
Albumin: 2.3 g/dL — ABNORMAL LOW (ref 3.5–5.0)
Alkaline Phosphatase: 52 U/L (ref 38–126)
Anion gap: 10 (ref 5–15)
BUN: 12 mg/dL (ref 8–23)
CO2: 29 mmol/L (ref 22–32)
Calcium: 8.3 mg/dL — ABNORMAL LOW (ref 8.9–10.3)
Chloride: 98 mmol/L (ref 98–111)
Creatinine, Ser: 0.8 mg/dL (ref 0.61–1.24)
GFR, Estimated: >60 mL/min (ref >60)
Glucose, Bld: 98 mg/dL (ref 70–99)
Potassium: 3.8 mmol/L (ref 3.5–5.1)
Sodium: 137 mmol/L (ref 135–145)
Total Bilirubin: 0.3 mg/dL (ref 0.3–1.2)
Total Protein: 6.3 g/dL — ABNORMAL LOW (ref 6.5–8.1)

## 2023-08-17 MED ORDER — MELATONIN 3 MG PO TABS
6.0000 mg | ORAL_TABLET | Freq: Every day | ORAL | Status: DC
  Administered 2023-08-17 – 2023-08-23 (×7): 6 mg via ORAL
  Filled 2023-08-17 (×7): qty 2

## 2023-08-17 MED ORDER — ENOXAPARIN SODIUM 40 MG/0.4ML IJ SOSY
40.0000 mg | PREFILLED_SYRINGE | INTRAMUSCULAR | Status: DC
  Administered 2023-08-17 – 2023-08-23 (×7): 40 mg via SUBCUTANEOUS
  Filled 2023-08-17 (×7): qty 0.4

## 2023-08-17 NOTE — TOC Transition Note (Signed)
Transition of Care (TOC) - CM/SW Discharge Note Donn Pierini RN, BSN Transitions of Care Unit 4E- RN Case Manager See Treatment Team for direct phone #   Patient Details  Name: [name] MRN: [id number] Date of Birth: [birth year]  Transition of Care [facility]) CM/SW Contact:  Darrold Span, RN Phone Number: 08/17/2023, 9:33 AM   Clinical Narrative:    Notified this am by CIR liaison that pt has bed available for transition to INPT rehab today.  Pt is stable per attending for transition to rehab toay, no further TOC needs noted.   Pt has [facility] office protocol referral to [facility] for any HH needs, liaison notified that pt is going to CIR. [facility] to follow.    Final next level of care: IP Rehab Facility Barriers to Discharge: Barriers Resolved   Patient Goals and CMS Choice CMS Medicare.gov Compare Post Acute Care list provided to:: Patient Choice offered to / list presented to : Patient  Discharge Placement               [facility] INPT rehab          Discharge Plan and Services Additional resources added to the After Visit Summary for     Discharge Planning Services: CM Consult Post Acute Care Choice: Home Health                    HH Arranged: PT, OT [facility] Agency: [facility] Date [facility] Agency Contacted: 08/10/23 Time HH Agency Contacted: [phone] Representative spoke with at [facility] Agency: Morrie Sheldon  Social Determinants of Health (SDOH) Interventions SDOH Screenings   Food Insecurity: No Food Insecurity (08/09/2023)  Housing: Low Risk  (08/09/2023)  Transportation Needs: Unmet Transportation Needs (08/09/2023)  Utilities: At Risk (08/09/2023)  Alcohol Screen: Low Risk  (02/10/2023)  Depression (PHQ2-9): Low Risk  (06/14/2023)  Financial Resource Strain: Low Risk  (02/10/2023)  Physical Activity: Insufficiently Active (02/10/2023)  Social Connections: Moderately Isolated (02/10/2023)  Stress: No Stress Concern Present (02/10/2023)  Tobacco Use: High  Risk (08/16/2023)     Readmission Risk Interventions    08/17/2023    9:33 AM 02/22/2021   12:24 PM  Readmission Risk Prevention Plan  Post Dischage Appt --   Medication Screening Complete   Transportation Screening Complete Complete  PCP or Specialist Appt within 5-7 Days  Complete  Home Care Screening  Complete  Medication Review (RN CM)  Complete

## 2023-08-17 NOTE — Progress Notes (Signed)
Ranelle Oyster, MD  Physician Physical Medicine and Rehabilitation   Consult Note    Signed   Date of Service: 08/14/2023  1:40 PM  Related encounter: Admission (Discharged) from 08/09/2023 in [facility] 4E CV SURGICAL PROGRESSIVE CARE   Signed     Expand All Collapse All  Show:Clear all [x] Written[x] Templated[] Copied  Added by: [x] Ranelle Oyster, MD  [] Hover for details          Physical Medicine and Rehabilitation Consult Reason for Consult:impaired functional mobility  Referring Physician: Chestine Spore     HPI: [name] is a 72 y.o. 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  He had previous right femoral endarterectomy in the past as well as stenting of the right common and external iliac arteries.  Patient developed worsening ischemia in the right leg and ultimately on 8/14 presented for redo exposure of the right common femoral artery with harvest of right lower extremity great saphenous vein and right common femoral artery to peroneal artery bypass.  Patient seems to have tolerated surgery well with expected pain at his incision sites.  Wounds have remained dry and stable with ongoing demarcation taking place in the right foot.  Patient was up with physical therapy today and was min assist to contact-guard assist for lateral scoot transfers.  The patient has a prosthesis at home for his left leg but apparently it has been nonfunctioning.  He is primarily using a wheelchair for mobility before this hospitalization.     Review of Systems  Constitutional: Negative.   HENT: Negative.    Eyes: Negative.   Respiratory: Negative.    Cardiovascular: Negative.   Gastrointestinal: Negative.   Genitourinary: Negative.   Musculoskeletal:  Positive for joint pain and myalgias.  Skin: Negative.   Neurological:  Positive for tingling and weakness.   Psychiatric/Behavioral:  Negative for suicidal ideas.         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   Critical lower limb ischemia (HCC) 12/19/2019   Diabetes mellitus      takes Metformin daily   GERD (gastroesophageal reflux disease)      takes Protonix daily   History of blood transfusion      "related to low HgB" ((09/10/2015   Hyperlipidemia      takes Vytorin daily   Hypertension      takes Benazepril and Bystolic daily   PAD (peripheral artery disease) (HCC)     Pneumonia     Radiculopathy, cervical region 02/11/2017   Shortness of breath dyspnea      with exertion   Tobacco user     Toe fracture, right 05/09/2011   Wears glasses               Past Surgical History:  Procedure Laterality Date   ABDOMINAL AORTOGRAM W/LOWER EXTREMITY N/A 12/11/2017    Procedure: ABDOMINAL AORTOGRAM W/LOWER EXTREMITY;  Surgeon: Chuck Hint, MD;  Location: [facility] INVASIVE CV LAB;  Service: Cardiovascular;  Laterality: N/A;   ABDOMINAL  AORTOGRAM W/LOWER EXTREMITY Bilateral 04/22/2020    Procedure: ABDOMINAL AORTOGRAM W/LOWER EXTREMITY;  Surgeon: Cephus Shelling, MD;  Location: [facility] INVASIVE CV LAB;  Service: Cardiovascular;  Laterality: Bilateral;   ABDOMINAL AORTOGRAM W/LOWER EXTREMITY N/A 07/27/2023    Procedure: ABDOMINAL AORTOGRAM W/LOWER EXTREMITY;  Surgeon: Cephus Shelling, MD;  Location: [facility] INVASIVE CV LAB;  Service: Cardiovascular;  Laterality: N/A;   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   AMPUTATION Left 02/19/2021    Procedure: LEFT ABOVE KNEE AMPUTATION;  Surgeon: Leonie Douglas, MD;  Location: [facility] OR;  Service: Vascular;  Laterality: Left;   ANTERIOR  CERVICAL DECOMP/DISCECTOMY FUSION   03/08/12    C6-7   ANTERIOR CERVICAL DECOMP/DISCECTOMY FUSION   03/08/2012    Procedure: ANTERIOR CERVICAL DECOMPRESSION/DISCECTOMY FUSION 1 LEVEL/HARDWARE REMOVAL;  Surgeon: Maeola Harman, MD;  Location: [facility] NEURO ORS;  Service: Neurosurgery;  Laterality: N/A;  revison of C5-7 anterior cervical decompression with fusion with Cervical Five-Thoracic One anterior cervical decompression with fusion with interbody prothesis plating and bonegraft   BACK SURGERY   1996   BIOPSY   12/05/2020    Procedure: BIOPSY;  Surgeon: Meryl Dare, MD;  Location: [facility] ENDOSCOPY;  Service: Endoscopy;;   BYPASS GRAFT FEMORAL-PERONEAL Left 11/11/2016    Procedure: REDO LEFT FEMORAL-PERONEAL BYPASS WITH PROPATEN X 80CM GRAFT;  Surgeon: Chuck Hint, MD;  Location: [facility] OR;  Service: Vascular;  Laterality: Left;   BYPASS GRAFT FEMORAL-PERONEAL Right 08/09/2023    Procedure: RIGHT  FEMORAL  ARTERY TO PERONEAL ARTERY BYPASS GRAFT USING GREATER SAPHENOUS VEIN;  Surgeon: Cephus Shelling, MD;  Location: [facility] OR;  Service: Vascular;  Laterality: Right;   COLONOSCOPY W/ BIOPSIES AND POLYPECTOMY   08/17/2012    f/u 5 years, 4 polyps, no high grade dysplasia or malignancy, tubular adenoma, hyperplastic polyops   COLONOSCOPY WITH PROPOFOL N/A 12/05/2020    Procedure: COLONOSCOPY WITH PROPOFOL;  Surgeon: Meryl Dare, MD;  Location: [facility] ENDOSCOPY;  Service: Endoscopy;  Laterality: N/A;   EMBOLECTOMY Left 12/19/2019    Procedure: LEFT FEMERAL- PERONEAL THROMBECTOMY;  Surgeon: Cephus Shelling, MD;  Location: [facility] OR;  Service: Vascular;  Laterality: Left;   ENDARTERECTOMY FEMORAL Right 04/24/2020    Procedure: RIGHT FEMORAL ENDARTERECTOMY;  Surgeon: Cephus Shelling, MD;  Location: [facility] OR;  Service: Vascular;  Laterality: Right;   ENTEROSCOPY N/A 12/11/2015    Procedure: ENTEROSCOPY;  Surgeon: Jeani Hawking, MD;  Location: [facility] ENDOSCOPY;  Service: Endoscopy;  Laterality: N/A;    ENTEROSCOPY N/A 03/29/2018    Procedure: ENTEROSCOPY;  Surgeon: Jeani Hawking, MD;  Location: [facility] ENDOSCOPY;  Service: Endoscopy;  Laterality: N/A;   ESOPHAGOGASTRODUODENOSCOPY   08/17/2012    normal esophagus and GEJ, diffuse gastritis with erythema- no malignancy, reactive gastropathy  with focal intestinal metaplasia   ESOPHAGOGASTRODUODENOSCOPY (EGD) WITH PROPOFOL N/A 12/05/2020    Procedure: ESOPHAGOGASTRODUODENOSCOPY (EGD) WITH PROPOFOL;  Surgeon: Meryl Dare, MD;  Location: [facility] ENDOSCOPY;  Service: Endoscopy;  Laterality: N/A;   FEMORAL-POPLITEAL BYPASS GRAFT Left 01/06/2016    Procedure: Left  COMMON FEMORAL-BELOW KNEE POPLITEAL ARTERY Bypass using non-reversed translocated saphenous vein graft from left leg;  Surgeon: Pryor Ochoa, MD;  Location: [facility] OR;  Service: Vascular;  Laterality: Left;   GIVENS CAPSULE STUDY N/A 11/24/2015    Procedure: GIVENS CAPSULE STUDY;  Surgeon: Charna Elizabeth, MD;  Location: [facility] ENDOSCOPY;  Service: Endoscopy;  Laterality: N/A;   HOT HEMOSTASIS N/A 03/29/2018    Procedure: HOT HEMOSTASIS (ARGON PLASMA COAGULATION/BICAP);  Surgeon:  Jeani Hawking, MD;  Location: [facility] ENDOSCOPY;  Service: Endoscopy;  Laterality: N/A;   INGUINAL HERNIA REPAIR   1990's    right   INTRAOPERATIVE ARTERIOGRAM Left 01/06/2016    Procedure: INTRA OPERATIVE ARTERIOGRAM LEFT LOWER LEG;  Surgeon: Pryor Ochoa, MD;  Location: [facility] OR;  Service: Vascular;  Laterality: Left;   INTRAOPERATIVE ARTERIOGRAM Left 11/11/2016    Procedure: INTRA OPERATIVE ARTERIOGRAM LEFT LOWER EXTRIMITY;  Surgeon: Chuck Hint, MD;  Location: [facility] OR;  Service: Vascular;  Laterality: Left;   IR ANGIOGRAM FOLLOW UP STUDY   12/11/2017   IR GENERIC HISTORICAL   10/24/2016    IR ANGIOGRAM FOLLOW UP STUDY   LOWER EXTREMITY ANGIOGRAM Bilateral 07/30/2015    Procedure: Lower Extremity Angiogram;  Surgeon: Fransisco Hertz, MD;  Location: [facility] INVASIVE CV LAB;  Service: Cardiovascular;  Laterality: Bilateral;   LUMBAR DISC  SURGERY   1996    "lower"   PATCH ANGIOPLASTY Left 12/19/2019    Procedure: LEFT FEMORAL -PERONEAL PATCH ANGIOPLASTY WITH XENOSURE BIOLOGIC PATCH  ;  Surgeon: Cephus Shelling, MD;  Location: [facility] OR;  Service: Vascular;  Laterality: Left;   PATCH ANGIOPLASTY Right 04/24/2020    Procedure: Patch Angioplasty of Right Femoral Artery using Long Xenosure Biologic Patch 1cm x 14 cm;  Surgeon: Cephus Shelling, MD;  Location: [facility] OR;  Service: Vascular;  Laterality: Right;   PERIPHERAL VASCULAR CATHETERIZATION N/A 07/30/2015    Procedure: Abdominal Aortogram;  Surgeon: Fransisco Hertz, MD;  Location: [facility] INVASIVE CV LAB;  Service: Cardiovascular;  Laterality: N/A;   PERIPHERAL VASCULAR CATHETERIZATION N/A 10/24/2016    Procedure: Abdominal Aortogram;  Surgeon: Chuck Hint, MD;  Location: [facility] INVASIVE CV LAB;  Service: Cardiovascular;  Laterality: N/A;   PERIPHERAL VASCULAR CATHETERIZATION N/A 10/24/2016    Procedure: Lower Extremity Angiography;  Surgeon: Chuck Hint, MD;  Location: [facility] INVASIVE CV LAB;  Service: Cardiovascular;  Laterality: N/A;   PERIPHERAL VASCULAR INTERVENTION Right 12/11/2017    Procedure: PERIPHERAL VASCULAR INTERVENTION;  Surgeon: Chuck Hint, MD;  Location: [facility] INVASIVE CV LAB;  Service: Cardiovascular;  Laterality: Right;   PERIPHERAL VASCULAR INTERVENTION Right 07/27/2023    Procedure: PERIPHERAL VASCULAR INTERVENTION;  Surgeon: Cephus Shelling, MD;  Location: [facility] INVASIVE CV LAB;  Service: Cardiovascular;  Laterality: Right;  Common Illiac   VASCULAR SURGERY   ~ 2007    Stent SFA    VEIN HARVEST Left 01/06/2016    Procedure: LEFT GREATER SAPPHENOUS VEIN HARVEST;  Surgeon: Pryor Ochoa, MD;  Location: [facility] OR;  Service: Vascular;  Laterality: Left;   VEIN HARVEST Right 08/09/2023    Procedure: RIGHT GREATER SAPHENOUS VEIN HARVEST;  Surgeon: Cephus Shelling, MD;  Location: [facility] OR;  Service: Vascular;  Laterality: Right;             Family  History  Problem Relation Age of Onset   Cancer Mother          colon Cancer   Hyperlipidemia Mother     Diabetes Mother     Heart disease Father     Hypertension Father     Cancer Sister          Uterine   Diabetes Sister     Diabetes Brother          Social History:  reports that he has been smoking cigars. He has been exposed to tobacco smoke. He has never used smokeless tobacco. He reports current alcohol use of about 2.0  standard drinks of alcohol per week. He reports that he does not use drugs. Allergies:  Allergies       Allergies  Allergen Reactions   Glipizide Other (See Comments)      REACTION IS SIDE EFFECT Severe hypoglycemia to 40s.             Medications Prior to Admission  Medication Sig Dispense Refill   albuterol (VENTOLIN HFA) 108 (90 Base) MCG/ACT inhaler TAKE 2 PUFFS BY MOUTH EVERY 6 HOURS AS NEEDED FOR WHEEZE OR SHORTNESS OF BREATH 8.5 each 1   amLODipine (NORVASC) 5 MG tablet Take 1 tablet (5 mg total) by mouth at bedtime. 90 tablet 3   aspirin EC 81 MG tablet Take 1 tablet (81 mg total) by mouth daily. Swallow whole.       atorvastatin (LIPITOR) 40 MG tablet TAKE 1 TABLET BY MOUTH EVERY DAY 90 tablet 1   benazepril (LOTENSIN) 5 MG tablet TAKE 1 TABLET BY MOUTH EVERY DAY 90 tablet 3   clopidogrel (PLAVIX) 75 MG tablet TAKE 1 TABLET BY MOUTH EVERY DAY 90 tablet 0   ezetimibe (ZETIA) 10 MG tablet TAKE 1 TABLET BY MOUTH EVERY DAY 90 tablet 1   FLUoxetine (PROZAC) 10 MG capsule TAKE 1 CAPSULE BY MOUTH EVERY DAY 90 capsule 3   gabapentin (NEURONTIN) 100 MG capsule Take 1 capsule (100 mg total) by mouth 3 (three) times daily. (Patient taking differently: Take 100 mg by mouth at bedtime.) 90 capsule 3   gabapentin (NEURONTIN) 300 MG capsule TAKE 1 CAPSULE BY MOUTH THREE TIMES A DAY AS NEEDED 90 capsule 1   metFORMIN (GLUCOPHAGE) 1000 MG tablet TAKE 1 TABLET BY MOUTH TWICE A DAY WITH FOOD 180 tablet 1   Nebivolol HCl 20 MG TABS TAKE 1 TABLET BY MOUTH EVERY DAY  90 tablet 3   nicotine polacrilex (NICORETTE) 2 MG gum Take 1 each (2 mg total) by mouth as needed for smoking cessation. 100 tablet 3   oxyCODONE-acetaminophen (PERCOCET) 5-325 MG tablet Take 1 tablet by mouth every 4 (four) hours as needed for severe pain. 30 tablet 0   pantoprazole (PROTONIX) 40 MG tablet TAKE 1 TABLET BY MOUTH EVERY DAY AS NEEDED 90 tablet 1   polyethylene glycol powder (GLYCOLAX/MIRALAX) 17 GM/SCOOP powder Take 17 g by mouth daily. (Patient not taking: Reported on 08/08/2023) 500 g 0          Home: Home Living Family/patient expects to be discharged to:: Private residence Living Arrangements: Spouse/significant other Available Help at Discharge: Family, Available 24 hours/day Type of Home: House Home Access: Ramped entrance Home Layout: One level Bathroom Shower/Tub: Engineer, manufacturing systems: Standard Bathroom Accessibility: Yes Home Equipment: Tub bench, Art gallery manager, Wheelchair - manual, Agricultural consultant (2 wheels), BSC/3in1  Functional History: Prior Function Prior Level of Function : Independent/Modified Independent Mobility Comments: using wheelchair in the home due to R leg giving out when attempting walking with prosthetic. ADLs Comments: mod I with showering with tub bench Functional Status:  Mobility: Bed Mobility Overal bed mobility: Needs Assistance Bed Mobility: Supine to Sit Supine to sit: HOB elevated, Used rails, Contact guard Sit to supine: Mod assist General bed mobility comments: increased time and effort, but pt able to perform without assist Transfers Overall transfer level: Needs assistance Equipment used: None Transfers: Bed to chair/wheelchair/BSC Bed to/from chair/wheelchair/BSC transfer type:: Lateral/scoot transfer Squat pivot transfers: Mod assist, +2 physical assistance  Lateral/Scoot Transfers: Min assist, Contact guard assist General transfer comment: Pt able to lateral  scoot into recliner with initial min A with  bedpad progressing to CGA. cues for anterior lean and increased bottom clearance Ambulation/Gait General Gait Details: unable baseline   ADL: ADL Overall ADL's : Needs assistance/impaired Eating/Feeding: Independent, Sitting Grooming: Set up, Sitting Upper Body Bathing: Set up, Sitting Lower Body Bathing: Moderate assistance, Sitting/lateral leans Upper Body Dressing : Set up, Sitting Lower Body Dressing: Moderate assistance, Sitting/lateral leans Toilet Transfer: Moderate assistance, +2 for physical assistance   Cognition: Cognition Overall Cognitive Status: Within Functional Limits for tasks assessed Orientation Level: Oriented X4 Cognition Arousal: Alert Behavior During Therapy: WFL for tasks assessed/performed Overall Cognitive Status: Within Functional Limits for tasks assessed   Blood pressure 90/68, pulse 77, temperature 98 F (36.7 C), temperature source Oral, resp. rate 19, height 5\' 7"  (1.702 m), weight 85.9 kg, SpO2 96%. Physical Exam Constitutional:      Appearance: He is obese.  HENT:     Head: Normocephalic.     Right Ear: External ear normal.     Left Ear: External ear normal.     Nose: Nose normal.     Mouth/Throat:     Mouth: Mucous membranes are moist.  Eyes:     Pupils: Pupils are equal, round, and reactive to light.  Cardiovascular:     Rate and Rhythm: Normal rate.  Pulmonary:     Effort: Pulmonary effort is normal.  Abdominal:     Palpations: Abdomen is soft.  Musculoskeletal:        General: Normal range of motion.     Cervical back: Normal range of motion.     Comments: Left AKA intact. Right lower extremity still tender. swollen  Skin:    General: Skin is warm.     Comments: Scattered bruises, incisions RLE. All appear clean and dry. Ischemic areas on 2nd and 3rd toes. ?small areas on other toes. Skin on foot is all dry.   Neurological:     General: No focal deficit present.     Mental Status: He is alert and oriented to person, place, and  time.     Comments: RUE motor 3/5 prox to 5/5 distally (limited by RTC injury). LUE similar 3-4/5 prox to 5/5 distally. LLE 4/5. RLE limited by pain.   Psychiatric:        Mood and Affect: Mood normal.        Behavior: Behavior normal.        Lab Results Last 24 Hours       Results for orders placed or performed during the hospital encounter of 08/09/23 (from the past 24 hour(s))  Glucose, capillary     Status: Abnormal    Collection Time: 08/13/23  5:00 PM  Result Value Ref Range    Glucose-Capillary 123 (H) 70 - 99 mg/dL    Comment 1 Notify RN      Comment 2 Document in Chart    Glucose, capillary     Status: Abnormal    Collection Time: 08/13/23  9:09 PM  Result Value Ref Range    Glucose-Capillary 150 (H) 70 - 99 mg/dL  Glucose, capillary     Status: Abnormal    Collection Time: 08/14/23  5:23 AM  Result Value Ref Range    Glucose-Capillary 124 (H) 70 - 99 mg/dL  Glucose, capillary     Status: Abnormal    Collection Time: 08/14/23 10:04 AM  Result Value Ref Range    Glucose-Capillary 112 (H) 70 - 99 mg/dL  Glucose, capillary  Status: Abnormal    Collection Time: 08/14/23 12:52 PM  Result Value Ref Range    Glucose-Capillary 130 (H) 70 - 99 mg/dL    *Note: Due to a large number of results and/or encounters for the requested time period, some results have not been displayed. A complete set of results can be found in Results Review.      Imaging Results (Last 48 hours)  No results found.     Assessment/Plan: Diagnosis: 72 yo  male with hx of Left AKA and residual significant PAD involving RLE who is now s/p common fem to peroneal artery BPG.  Does the need for close, 24 hr/day medical supervision in concert with the patient's rehab needs make it unreasonable for this patient to be served in a less intensive setting? Yes Co-Morbidities requiring supervision/potential complications:  -bilateral rotator cuff injuries which restrict shoulder ROM and strength -ischemia  right foot -diabetes with peripheral neuropathy Due to bladder management, bowel management, safety, skin/wound care, disease management, medication administration, pain management, and patient education, does the patient require 24 hr/day rehab nursing? Yes Does the patient require coordinated care of a physician, rehab nurse, therapy disciplines of PT, OT to address physical and functional deficits in the context of the above medical diagnosis(es)? Yes Addressing deficits in the following areas: balance, endurance, locomotion, strength, transferring, bowel/bladder control, bathing, dressing, feeding, grooming, toileting, and psychosocial support Can the patient actively participate in an intensive therapy program of at least 3 hrs of therapy per day at least 5 days per week? Yes The potential for patient to make measurable gains while on inpatient rehab is excellent Anticipated functional outcomes upon discharge from inpatient rehab are modified independent  with PT, modified independent with OT, n/a with SLP at a wheelchair level Estimated rehab length of stay to reach the above functional goals is: 7 days Anticipated discharge destination: Home Overall Rehab/Functional Prognosis: excellent   POST ACUTE RECOMMENDATIONS: This patient's condition is appropriate for continued rehabilitative care in the following setting: CIR Patient has agreed to participate in recommended program. Yes Note that insurance prior authorization may be required for reimbursement for recommended care.   Comment: Pt is motivated to regain functional mobility. Wife is at home to provide supervision. Home is w/c accessible. Rehab Admissions Coordinator to follow up          I have personally performed a face to face diagnostic evaluation of this patient. Additionally, I have examined the patient's medical record including any pertinent labs and radiographic images. If the physician assistant has documented in this  note, I have reviewed and edited or otherwise concur with the physician assistant's documentation.   Thanks,   Ranelle Oyster, MD 08/14/2023          Routing History

## 2023-08-17 NOTE — Progress Notes (Signed)
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   Critical lower limb ischemia (HCC) 12/19/2019   Diabetes mellitus    takes Metformin daily   GERD (gastroesophageal reflux disease)    takes Protonix daily   History of blood transfusion    "related to low HgB" ((09/10/2015   Hyperlipidemia    takes Vytorin daily   Hypertension    takes Benazepril and Bystolic daily   PAD (peripheral artery disease) (HCC)    Pneumonia    Radiculopathy, cervical region 02/11/2017   Shortness of breath dyspnea    with exertion   Tobacco user    Toe fracture, right 05/09/2011   Wears glasses    Past Surgical History:  Past Surgical History:  Procedure Laterality Date   ABDOMINAL AORTOGRAM W/LOWER EXTREMITY N/A 12/11/2017   Procedure: ABDOMINAL AORTOGRAM W/LOWER EXTREMITY;  Surgeon: Chuck Hint, MD;  Location: [facility] INVASIVE CV LAB;  Service: Cardiovascular;  Laterality: N/A;   ABDOMINAL AORTOGRAM W/LOWER EXTREMITY Bilateral 04/22/2020   Procedure: ABDOMINAL AORTOGRAM W/LOWER EXTREMITY;  Surgeon: Cephus Shelling, MD;  Location: [facility] INVASIVE CV LAB;  Service: Cardiovascular;  Laterality:  Bilateral;   ABDOMINAL AORTOGRAM W/LOWER EXTREMITY N/A 07/27/2023   Procedure: ABDOMINAL AORTOGRAM W/LOWER EXTREMITY;  Surgeon: Cephus Shelling, MD;  Location: [facility] INVASIVE CV LAB;  Service: Cardiovascular;  Laterality: N/A;   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   AMPUTATION Left 02/19/2021   Procedure: LEFT ABOVE KNEE AMPUTATION;  Surgeon: Leonie Douglas, MD;  Location: [facility] OR;  Service: Vascular;  Laterality: Left;   ANTERIOR CERVICAL DECOMP/DISCECTOMY FUSION  03/08/12   C6-7   ANTERIOR CERVICAL DECOMP/DISCECTOMY FUSION  03/08/2012   Procedure: ANTERIOR CERVICAL DECOMPRESSION/DISCECTOMY FUSION 1 LEVEL/HARDWARE REMOVAL;  Surgeon: Maeola Harman, MD;  Location: [facility] NEURO ORS;  Service: Neurosurgery;  Laterality: N/A;  revison of C5-7 anterior cervical decompression with fusion with Cervical Five-Thoracic One anterior cervical decompression with fusion with interbody prothesis plating and bonegraft   BACK SURGERY  1996   BIOPSY  12/05/2020   Procedure: BIOPSY;  Surgeon: Meryl Dare, MD;  Location: [facility] ENDOSCOPY;  Service: Endoscopy;;   BYPASS GRAFT FEMORAL-PERONEAL Left 11/11/2016   Procedure: REDO LEFT FEMORAL-PERONEAL BYPASS WITH PROPATEN X 80CM GRAFT;  Surgeon: Chuck Hint, MD;  Location: [facility] OR;  Service: Vascular;  Laterality: Left;   BYPASS GRAFT FEMORAL-PERONEAL Right 08/09/2023   Procedure: RIGHT  FEMORAL  ARTERY TO PERONEAL ARTERY BYPASS GRAFT USING GREATER SAPHENOUS VEIN;  Surgeon: Cephus Shelling, MD;  Location: [facility] OR;  Service: Vascular;  Laterality: Right;   COLONOSCOPY W/ BIOPSIES AND POLYPECTOMY  08/17/2012   f/u 5 years, 4 polyps, no high grade dysplasia or malignancy, tubular adenoma, hyperplastic polyops  COLONOSCOPY WITH PROPOFOL N/A 12/05/2020   Procedure: COLONOSCOPY WITH PROPOFOL;  Surgeon: Meryl Dare, MD;  Location: [facility] ENDOSCOPY;  Service: Endoscopy;  Laterality: N/A;   EMBOLECTOMY Left 12/19/2019   Procedure: LEFT FEMERAL- PERONEAL  THROMBECTOMY;  Surgeon: Cephus Shelling, MD;  Location: [facility] OR;  Service: Vascular;  Laterality: Left;   ENDARTERECTOMY FEMORAL Right 04/24/2020   Procedure: RIGHT FEMORAL ENDARTERECTOMY;  Surgeon: Cephus Shelling, MD;  Location: [facility] OR;  Service: Vascular;  Laterality: Right;   ENTEROSCOPY N/A 12/11/2015   Procedure: ENTEROSCOPY;  Surgeon: Jeani Hawking, MD;  Location: [facility] ENDOSCOPY;  Service: Endoscopy;  Laterality: N/A;   ENTEROSCOPY N/A 03/29/2018   Procedure: ENTEROSCOPY;  Surgeon: Jeani Hawking, MD;  Location: [facility] ENDOSCOPY;  Service: Endoscopy;  Laterality: N/A;   ESOPHAGOGASTRODUODENOSCOPY  08/17/2012   normal esophagus and GEJ, diffuse gastritis with erythema- no malignancy, reactive gastropathy  with focal intestinal metaplasia   ESOPHAGOGASTRODUODENOSCOPY (EGD) WITH PROPOFOL N/A 12/05/2020   Procedure: ESOPHAGOGASTRODUODENOSCOPY (EGD) WITH PROPOFOL;  Surgeon: Meryl Dare, MD;  Location: [facility] ENDOSCOPY;  Service: Endoscopy;  Laterality: N/A;   FEMORAL-POPLITEAL BYPASS GRAFT Left 01/06/2016   Procedure: Left  COMMON FEMORAL-BELOW KNEE POPLITEAL ARTERY Bypass using non-reversed translocated saphenous vein graft from left leg;  Surgeon: Pryor Ochoa, MD;  Location: [facility] OR;  Service: Vascular;  Laterality: Left;   GIVENS CAPSULE STUDY N/A 11/24/2015   Procedure: GIVENS CAPSULE STUDY;  Surgeon: Charna Elizabeth, MD;  Location: [facility] ENDOSCOPY;  Service: Endoscopy;  Laterality: N/A;   HOT HEMOSTASIS N/A 03/29/2018   Procedure: HOT HEMOSTASIS (ARGON PLASMA COAGULATION/BICAP);  Surgeon: Jeani Hawking, MD;  Location: [facility] ENDOSCOPY;  Service: Endoscopy;  Laterality: N/A;   INGUINAL HERNIA REPAIR  1990's   right   INTRAOPERATIVE ARTERIOGRAM Left 01/06/2016   Procedure: INTRA OPERATIVE ARTERIOGRAM LEFT LOWER LEG;  Surgeon: Pryor Ochoa, MD;  Location: [facility] OR;  Service: Vascular;  Laterality: Left;   INTRAOPERATIVE ARTERIOGRAM Left 11/11/2016   Procedure: INTRA OPERATIVE ARTERIOGRAM LEFT LOWER EXTRIMITY;   Surgeon: Chuck Hint, MD;  Location: [facility] OR;  Service: Vascular;  Laterality: Left;   IR ANGIOGRAM FOLLOW UP STUDY  12/11/2017   IR GENERIC HISTORICAL  10/24/2016   IR ANGIOGRAM FOLLOW UP STUDY   LOWER EXTREMITY ANGIOGRAM Bilateral 07/30/2015   Procedure: Lower Extremity Angiogram;  Surgeon: Fransisco Hertz, MD;  Location: [facility] INVASIVE CV LAB;  Service: Cardiovascular;  Laterality: Bilateral;   LUMBAR DISC SURGERY  1996   "lower"   PATCH ANGIOPLASTY Left 12/19/2019   Procedure: LEFT FEMORAL -PERONEAL PATCH ANGIOPLASTY WITH XENOSURE BIOLOGIC PATCH  ;  Surgeon: Cephus Shelling, MD;  Location: [facility] OR;  Service: Vascular;  Laterality: Left;   PATCH ANGIOPLASTY Right 04/24/2020   Procedure: Patch Angioplasty of Right Femoral Artery using Long Xenosure Biologic Patch 1cm x 14 cm;  Surgeon: Cephus Shelling, MD;  Location: [facility] OR;  Service: Vascular;  Laterality: Right;   PERIPHERAL VASCULAR CATHETERIZATION N/A 07/30/2015   Procedure: Abdominal Aortogram;  Surgeon: Fransisco Hertz, MD;  Location: [facility] INVASIVE CV LAB;  Service: Cardiovascular;  Laterality: N/A;   PERIPHERAL VASCULAR CATHETERIZATION N/A 10/24/2016   Procedure: Abdominal Aortogram;  Surgeon: Chuck Hint, MD;  Location: [facility] INVASIVE CV LAB;  Service: Cardiovascular;  Laterality: N/A;   PERIPHERAL VASCULAR CATHETERIZATION N/A 10/24/2016   Procedure: Lower Extremity Angiography;  Surgeon: Chuck Hint, MD;  Location: [facility] INVASIVE CV LAB;  Service: Cardiovascular;  Laterality: N/A;   PERIPHERAL VASCULAR INTERVENTION Right 12/11/2017  Procedure: PERIPHERAL VASCULAR INTERVENTION;  Surgeon: Chuck Hint, MD;  Location: [facility] INVASIVE CV LAB;  Service: Cardiovascular;  Laterality: Right;   PERIPHERAL VASCULAR INTERVENTION Right 07/27/2023   Procedure: PERIPHERAL VASCULAR INTERVENTION;  Surgeon: Cephus Shelling, MD;  Location: [facility] INVASIVE CV LAB;  Service: Cardiovascular;  Laterality: Right;  Common Illiac    VASCULAR SURGERY  ~ 2007   Stent SFA    VEIN HARVEST Left 01/06/2016   Procedure: LEFT GREATER SAPPHENOUS VEIN HARVEST;  Surgeon: Pryor Ochoa, MD;  Location: [facility] OR;  Service: Vascular;  Laterality: Left;   VEIN HARVEST Right 08/09/2023   Procedure: RIGHT GREATER SAPHENOUS VEIN HARVEST;  Surgeon: Cephus Shelling, MD;  Location: [facility] OR;  Service: Vascular;  Laterality: Right;    Assessment & Plan Clinical Impression: Patient is a 73 y.o. year old male with history of ACDF 03/08/2012, of hyperlipidemia, hypertension, tobacco use diabetes mellitus/PAD with multiple revascularization procedures including right EIA stenting 2018 and right femoral endarterectomy April 2021, left AKA 02/19/2021 per Dr. Lenell Antu.  Per chart review patient lives with spouse.  1 level home with a ramped entrance.  Primarily uses a wheelchair in the home due to right leg giving out when attempting walking with left lower extremity prosthesis.  Presented 08/09/2023 with significant pain of the right foot x 3 weeks as well as discoloration which progressed to some wounds on the toes and significant pain in the foot.  He was seen by podiatrist Dr. Annamary Rummage on 7/26 who recommended urgent evaluation with vascular surgery concerns of ischemia of right foot.  He has known SFA occlusion with single-vessel peroneal runoff.  ABIs decreased from prior study 6 months ago.  Patient underwent redo exposure of right common femoral artery harvest of right lower extremity great saphenous vein with right common femoral artery to peroneal artery bypass with ipsilateral reversed great saphenous vein 08/09/2023 per Dr. Chestine Spore.  Patient remains on low-dose aspirin and Plavix as prior to admission.  Subcutaneous heparin added for DVT prophylaxis.  Acute on chronic anemia latest hemoglobin 9.0.  Therapy evaluations completed due to patient decreased functional mobility was admitted for a comprehensive rehab program.   Patient transferred to CIR on 08/16/2023  .   Patient currently requires min with mobility secondary to muscle weakness and muscle joint tightness, decreased cardiorespiratoy endurance, and decreased sitting balance, decreased standing balance, decreased postural control, and decreased balance strategies.  Prior to hospitalization, patient was modified independent  with mobility and lived with Spouse in a House home.  Home access is  Ramped entrance.  Patient will benefit from skilled PT intervention to maximize safe functional mobility, minimize fall risk, and decrease caregiver burden for planned discharge home with 24 hour supervision.  Anticipate patient will benefit from follow up HH at discharge.  PT - End of Session Activity Tolerance: Tolerates 30+ min activity with multiple rests Endurance Deficit: Yes Endurance Deficit Description: decreased PT Assessment Rehab Potential (ACUTE/IP ONLY): Fair PT Barriers to Discharge: Decreased caregiver support PT Barriers to Discharge Comments: spouse unable to physically assist PT Patient demonstrates impairments in the following area(s): Balance;Pain;Edema;Endurance;Sensory;Motor;Skin Integrity PT Transfers Functional Problem(s): Bed Mobility;Bed to Chair;Car PT Locomotion Functional Problem(s): Wheelchair Mobility PT Plan PT Intensity: Minimum of 1-2 x/day ,45 to 90 minutes PT Frequency: 5 out of 7 days PT Duration Estimated Length of Stay: 7-10 days PT Treatment/Interventions: Discharge planning;DME/adaptive equipment instruction;Functional mobility training;Pain management;Psychosocial support;Therapeutic Activities;UE/LE Strength taining/ROM;UE/LE Coordination activities;Patient/family education;Neuromuscular re-education;Disease management/prevention;Balance/vestibular training;Community reintegration PT Transfers Anticipated Outcome(s): Mod  I PT Locomotion Anticipated Outcome(s): Mod I w/c PT Recommendation Recommendations for Other Services: Other (comment) (TBD) Follow Up  Recommendations: Home health PT Patient destination: Home Equipment Recommended: To be determined Equipment Details: owns manual wheelchair, power wheelchair   PT Evaluation Precautions/Restrictions Precautions Precautions: Fall Precaution Comments: L AKA Restrictions Weight Bearing Restrictions: No General   Vital Signs  Pain   Pain Interference Pain Interference Pain Effect on Sleep: 4. Almost constantly Pain Interference with Therapy Activities: 3. Frequently Pain Interference with Day-to-Day Activities: 3. Frequently Home Living/Prior Functioning Home Living Available Help at Discharge: Family;Available 24 hours/day Type of Home: House Home Access: Ramped entrance Home Layout: One level Bathroom Shower/Tub: Engineer, manufacturing systems: Handicapped height Bathroom Accessibility: No  Lives With: Spouse Prior Function Level of Independence: Requires assistive device for independence Driving: Yes Vocation: On disability Vision/Perception     Cognition Overall Cognitive Status: Within Functional Limits for tasks assessed Arousal/Alertness: Awake/alert Orientation Level: Oriented X4 Memory: Appears intact Awareness: Appears intact Problem Solving: Appears intact Safety/Judgment: Appears intact Sensation Sensation Light Touch: Impaired by gross assessment Additional Comments: right LE diminshed L4, L5 Coordination Gross Motor Movements are Fluid and Coordinated: No Fine Motor Movements are Fluid and Coordinated: Yes Coordination and Movement Description: grossly uncoordinated 2/2 L AKA and pain Finger Nose Finger Test: [facility] Heel Shin Test: WFL R LE, UTA L LE 2/2 AKA Motor  Motor Motor: Other (comment) (L AKA) Motor - Skilled Clinical Observations: L AKA (2022)   Trunk/Postural Assessment  Cervical Assessment Cervical Assessment: Exceptions to [facility] (forward head) Thoracic Assessment Thoracic Assessment: Exceptions to [facility] (rounded shoulders) Lumbar  Assessment Lumbar Assessment: Exceptions to [facility] (posterior pelvic tilt) Postural Control Postural Control: Deficits on evaluation Protective Responses: delayed  Balance Balance Balance Assessed: Yes Static Sitting Balance Static Sitting - Balance Support: Bilateral upper extremity supported Static Sitting - Level of Assistance: 5: Stand by assistance (supervision) Dynamic Sitting Balance Dynamic Sitting - Balance Support: Bilateral upper extremity supported Dynamic Sitting - Level of Assistance: 5: Stand by assistance (supervision) Dynamic Sitting - Balance Activities: Lateral lean/weight shifting Extremity Assessment      RLE Assessment RLE Assessment: Exceptions to [facility] General Strength Comments: Grossly 4-/5 LLE Assessment LLE Assessment: Exceptions to [facility] General Strength Comments: Grossly 3/5, unable to formally assess  Care Tool Care Tool Bed Mobility Roll left and right activity   Roll left and right assist level: Supervision/Verbal cueing    Sit to lying activity   Sit to lying assist level: Supervision/Verbal cueing    Lying to sitting on side of bed activity   Lying to sitting on side of bed assist level: the ability to move from lying on the back to sitting on the side of the bed with no back support.: Supervision/Verbal cueing     Care Tool Transfers Sit to stand transfer   Sit to stand assist level: Moderate Assistance - Patient 50 - 74%    Chair/bed transfer   Chair/bed transfer assist level: Minimal Assistance - Patient > 75%     Toilet transfer Toilet transfer activity did not occur: Safety/medical concerns (fatigue, pain)      Car transfer   Car transfer assist level: Minimal Assistance - Patient > 75%      Care Tool Locomotion Ambulation Ambulation activity did not occur: Safety/medical concerns (fatigue, pain)        Walk 10 feet activity Walk 10 feet activity did not occur: Safety/medical concerns       Walk 50 feet with  2 turns activity  Walk 50 feet with 2 turns activity did not occur: Safety/medical concerns      Walk 150 feet activity Walk 150 feet activity did not occur: Safety/medical concerns      Walk 10 feet on uneven surfaces activity Walk 10 feet on uneven surfaces activity did not occur: Safety/medical concerns      Stairs Stair activity did not occur: Safety/medical concerns (fatigue, pain)        Walk up/down 1 step activity Walk up/down 1 step or curb (drop down) activity did not occur: Safety/medical concerns      Walk up/down 4 steps activity Walk up/down 4 steps activity did not occur: Safety/medical concerns      Walk up/down 12 steps activity Walk up/down 12 steps activity did not occur: Safety/medical concerns      Pick up small objects from floor Pick up small object from the floor (from standing position) activity did not occur: Safety/medical concerns (fatigue, pain)      Wheelchair Is the patient using a wheelchair?: Yes     Wheelchair assist level: Supervision/Verbal cueing Max wheelchair distance: 75 ft  Wheel 50 feet with 2 turns activity   Assist Level: Supervision/Verbal cueing  Wheel 150 feet activity   Assist Level: Maximal Assistance - Patient 25 - 49%    Refer to Care Plan for Long Term Goals  SHORT TERM GOAL WEEK 1 PT Short Term Goal 1 (Week 1): STG's=LTG's due to ELOS  Recommendations for other services: None   Skilled Therapeutic Intervention Mobility Bed Mobility Bed Mobility: Rolling Right;Rolling Left;Supine to Sit;Sit to Supine Rolling Right: Supervision/verbal cueing Rolling Left: Supervision/Verbal cueing Supine to Sit: Supervision/Verbal cueing Sit to Supine: Supervision/Verbal cueing Transfers Transfers: Sit to Stand;Stand to Sit;Squat Pivot Transfers;Lateral/Scoot Transfers Sit to Stand: Moderate Assistance - Patient 50-74% Stand to Sit: Moderate Assistance - Patient 50-74% Squat Pivot Transfers: Minimal Assistance - Patient > 75% Lateral/Scoot  Transfers: Minimal Assistance - Patient > 75% Transfer (Assistive device): Other (Comment) (RW and slide board) Locomotion  Gait Ambulation: No Gait Gait: No Stairs / Additional Locomotion Stairs: No Wheelchair Mobility Wheelchair Mobility: Yes Wheelchair Assistance: Doctor, general practice: Both upper extremities Wheelchair Parts Management: Needs assistance Distance: 75 ft   Pt received semi-reclined in bed and agreeable to PT intervention. Pt with 7-9/10, pre-medicated and provided rest/repositioning for relief.   Pt oriented to CIR policies and procedures and PT assessed pain interference, sensation, coordination, strength and balance.   Pt (S) with bed mobility with use of rails and for static/dynamic sitting balance edge of bed. Pt mod A with sit<>stand with RW without prosthesis as not present in room.   Pt min A with slide board transfer to w/c and (S) with wheelchair propulsion x 75 ft. Pt dependently transported remaining difference to main gym and min A with squat pivot to mat.   Pt (S) with sit to lying and performed 3 x 10 of SAQ with 3 second hold at end range and 2 x 8 of heel slides with R LE.   Pt transferred to edge of mat and returned via squat pivot with above assist level.   Pt left seated in w/c at bedside with all needs in reach and alarm on.    Discharge Criteria: Patient will be discharged from PT if patient refuses treatment 3 consecutive times without medical reason, if treatment goals not met, if there is a change in medical status, if patient makes no progress towards goals or if  patient is discharged from hospital.  The above assessment, treatment plan, treatment alternatives and goals were discussed and mutually agreed upon: by patient  Priscille Kluver PT, DPT  08/17/2023, 9:51 AM

## 2023-08-17 NOTE — Progress Notes (Addendum)
Inpatient Rehabilitation Admission Medication Review by a Pharmacist   A complete drug regimen review was completed for this patient to identify any potential clinically significant medication issues.   High Risk Drug Classes Is patient taking? Indication by Medication  Antipsychotic No    Anticoagulant Yes Lovenox DVT prophylaxis    Antibiotic No    Opioid Yes Percocet - PRN pain  Antiplatelet Yes Aspirin - risk reduction  Plavix - - risk reduction   Hypoglycemics/insulin Yes  Insulin - T2Dm Metformin - T2Dm  Vasoactive Medication Yes Bystolic - HTN   Chemotherapy No    Other Yes Protonix - GERD Lipitor - hyperlipidemia  Baclofen - muscle spasms Zetia - hyperlipidemia  Prozac - depression/ mood Gabapentin - neuropathic pain Nicotine gum - smoing cessation Trazodone - PRN sleep        Type of Medication Issue Identified Description of Issue Recommendation(s)  Drug Interaction(s) (clinically significant)        Duplicate Therapy        Allergy        No Medication Administration End Date        Incorrect Dose        Additional Drug Therapy Needed        Significant med changes from prior encounter (inform family/care partners about these prior to discharge).      Other            Clinically significant medication issues were identified that warrant physician communication and completion of prescribed/recommended actions by midnight of the next day:  No     Pharmacist comments: benazepril not resumed from home PTA to inpatient, Protonix is new from PTA list, maybe able to D/C at discharge if no longer necessary. Heparin subcutaneous changed to lovenox for dvt prophylaxes    Time spent performing this drug regimen review (minutes):  30 minutes    Ruben Im, PharmD Clinical Pharmacist 08/17/2023 7:23 AM Please check AMION for all [facility] numbers

## 2023-08-17 NOTE — Progress Notes (Signed)
PROGRESS NOTE   Subjective/Complaints:    Objective:   No results found. Recent Labs    08/16/23 1844 08/17/23 0523  WBC 5.0 4.2  HGB 9.3* 9.4*  HCT 32.8* 33.5*  PLT 318 296   Recent Labs    08/16/23 1844 08/17/23 0523  NA  --  137  K  --  3.8  CL  --  98  CO2  --  29  GLUCOSE  --  98  BUN  --  12  CREATININE 0.84 0.80  CALCIUM  --  8.3*    Intake/Output Summary (Last 24 hours) at 08/17/2023 0919 Last data filed at 08/17/2023 0746 Gross per 24 hour  Intake 760 ml  Output 550 ml  Net 210 ml     Pressure Injury 08/16/23 Buttocks Left Stage 2 -  Partial thickness loss of dermis presenting as a shallow open injury with a red, pink wound bed without slough. Small stage 2 just inside left buttock (Active)  08/16/23 1720  Location: Buttocks  Location Orientation: Left  Staging: Stage 2 -  Partial thickness loss of dermis presenting as a shallow open injury with a red, pink wound bed without slough.  Wound Description (Comments): Small stage 2 just inside left buttock  Present on Admission: Yes    Physical Exam: Vital Signs Blood pressure 113/69, pulse 74, temperature 98.9 F (37.2 C), temperature source Oral, resp. rate 16, height 5\' 7"  (1.702 m), weight 82.6 kg, SpO2 100%.    General: awake, alert, appropriate, supine in bed; c/o pain; NAD HENT: conjugate gaze; oropharynx moist CV: regular rate and rhythm; no JVD Pulmonary: CTA B/L; no W/R/R- good air movement GI: soft, NT, ND, (+)BS Psychiatric: appropriate- interactive Neurological: Ox3 Skin: healing/glued 3 incision on R interior groin/thigh and calf- from bypass surgery- looks good- no erythema, no drainage, mild edema  Assessment/Plan: 1. Functional deficits which require 3+ hours per day of interdisciplinary therapy in a comprehensive inpatient rehab setting. Physiatrist is providing close team supervision and 24 hour management of active  medical problems listed below. Physiatrist and rehab team continue to assess barriers to discharge/monitor patient progress toward functional and medical goals  Care Tool:  Bathing              Bathing assist       Upper Body Dressing/Undressing Upper body dressing        Upper body assist      Lower Body Dressing/Undressing Lower body dressing            Lower body assist       Toileting Toileting    Toileting assist       Transfers Chair/bed transfer  Transfers assist           Locomotion Ambulation   Ambulation assist              Walk 10 feet activity   Assist           Walk 50 feet activity   Assist           Walk 150 feet activity   Assist           Walk 10  feet on uneven surface  activity   Assist           Wheelchair     Assist               Wheelchair 50 feet with 2 turns activity    Assist            Wheelchair 150 feet activity     Assist          Blood pressure 113/69, pulse 74, temperature 98.9 F (37.2 C), temperature source Oral, resp. rate 16, height 5\' 7"  (1.702 m), weight 82.6 kg, SpO2 100%.   Medical Problem List and Plan: 1. Functional deficits secondary to debility related to PAD status post right common femoral to peroneal bypass for critical limb ischemia with tissue loss 08/09/2023 per Dr. Chestine Spore as well as history of left AKA 2022.             -patient may shower but incisions must be covered             -ELOS/Goals: modI 7-10 days             Admit to CIR  First day of evaluations- con't CIR PT and OT 2.  Antithrombotics: -DVT/anticoagulation:  Pharmaceutical: Heparin 8/22- will change to Lovenox since has been 4+ days since surgery and Cr/BUN allow it- less injections             -antiplatelet therapy: Aspirin 81 mg daily and Plavix 75 mg daily 3. Pain Management: Neurontin 300 mg 3 times daily, baclofen 5 mg twice daily as needed, oxycodone as  needed  8/22- pt reports bed really bothering back/buttocks- will get air bed  -pain reports just got pain meds-taking 2 tabs usually- and wil monitor if needs to increase since rating pain 6/10 this AM, before meds kicked in.  4. Mood/Behavior/Sleep: Prozac 10 mg daily.  Provide emotional support             -antipsychotic agents: N/A 5. Neuropsych/cognition: This patient is capable of making decisions on his own behalf. 6. Skin/Wound Care: Routine skin checks  8/22- R thigh/groin and Claf look good- L AKA healed-  7. Fluids/Electrolytes/Nutrition: Routine in and outs with follow-up chemistries 8.  Acute on chronic anemia.  Follow-up CBC  8/22- Hb 9.4 stable to increasing- will monitor weekly.  9.  Diabetes mellitus with peripheral neuropathy.  Hemoglobin A1c 6.4.  Glucophage 1000 mg twice daily.  8/22- Cbgs running 104-117- doing great- will con't regimen 10.  Hyperlipidemia.  Lipitor/Zetia 11.  Hypertension.  Reduce Bystolic to 10mg  daily given hypotension. Monitor with increased mobility  8/22- BP's a little soft- in 110s-110s- will monitor with therapy if has orthostaitc hypotension 12.  GERD.  Continue Protonix 13.  Obesity.  BMI 29.66.  Dietary follow-up  8/22- Down to 28.52 14.  Tobacco abuse.  Nicorette gum as needed.  Provide counseling 15.  Constipation.  Continue MiraLAX daily, Colace 100 mg daily  8/22- Pt reports LBM yesterday- denies constipation.  16. Insomnia  8/22- will add Melatonin 6 mg at bedtime.   I spent a total of 54   minutes on total care today- >50% coordination of care- due to  D/w nursing about changing bed; also with pt about back/buttocks pain - also reviewing labs, vitals and admission/recent notes  Getting to know pt.   LOS: 1 days A FACE TO FACE EVALUATION WAS PERFORMED  [name] 08/17/2023, 9:19 AM

## 2023-08-17 NOTE — Progress Notes (Addendum)
Horton Chin, MD  Physician Physical Medicine and Rehabilitation   Progress Notes    Signed   Date of Service: 08/16/2023  8:06 AM  Related encounter: Admission (Discharged) from 08/09/2023 in [facility] 4E CV SURGICAL PROGRESSIVE CARE   Signed      Show:Clear all [x] Written[x] Templated[x] Copied  Added by: [x] Standley Brooking, RN  [] Hover for details   Beryle Beams  Rehab Admission Coordinator Physical Medicine and Rehabilitation   PMR Pre-admission    Incomplete   Date of Service: 08/14/2023  1:19 PM    Incomplete      Expand All Collapse All  Show:Clear all [x] Written[x] Templated[x] Copied   Added by: [x] Beryle Beams   [] Hover for details PMR Admission Coordinator Pre-Admission Assessment   Patient: [name] is an 72 y.o., male MRN: [id number] DOB: [birth year] Height: 5\' 7"  (170.2 cm) Weight: 85.9 kg                                                                                                                                                  Insurance Information HMO: yes    PPO:      PCP:      IPA:      80/20:      OTHER:  PRIMARY: [employer] Medicare      Policy#: [id number]      Subscriber: patient CM Name: Brie      Phone#: [phone]     Fax#: [phone] Pre-Cert#: [id number]   approved 8/21 until 9/2 with updates due 9/3   Employer:  Benefits:  Phone #: [phone]     Name: 8/19 Eff. Date: 12/26/2022-still active     Deduct: $240  Out of Pocket Max: $8.850 met)        CIR: $2,080 co/pay admission      SNF: $0/day co pay for days 1-20, $204/day co pay for days 21-100  Outpatient: 80% coverage; 20% co-insurance     Home Health: 80% coverage; 20% co-insurance       DME: 80% coverage; 20% co-insurance     Providers: In network   SECONDARY; Medicaid [place] access policy # [id number] o active 8/21 per passport one source [facility]   The "Data Collection Information Summary" for patients in Inpatient Rehabilitation Facilities with  attached "Privacy Act Statement-Health Care Records" was provided and verbally reviewed with: Patient and Family   Emergency Contact Information Contact Information       Name Relation Home Work Mobile    [place] Spouse     [phone]         Other Contacts       Name Relation Home Work Mobile    Geller,Garland Brother [phone]   [phone]    Alphonsa Gin [phone]   [phone]         Current Medical History  Patient Admitting Diagnosis: Critical limb  ischemia of the R LE with tissue loss.     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 Lenell Antu.  Primarily uses a wheelchair in the home due to right leg giving out when attempting walking with left lower extremity prosthesis. Presented 08/09/2023 with significant pain of the right foot x 3 weeks as well as discoloration which progressed to some wounds on the toes and significant pain in the foot.    He was seen by podiatrist Dr. Annamary Rummage on 7/26 who recommended urgent evaluation with vascular surgery concerns of ischemia of right foot. He has known SFA occlusion with single-vessel peroneal runoff. ABIs decreased from prior study 6 months ago. Patient underwent redo exposure of right common femoral artery harvest of right lower extremity great saphenous vein with right common femoral artery to peroneal artery bypass with ipsilateral reversed great saphenous vein 08/09/2023 per Dr. Chestine Spore. Patient remains on low-dose aspirin and Plavix as prior to admission. Subcutaneous heparin added for DVT prophylaxis. Acute on chronic anemia latest hemoglobin 9.0.    Patient's medical record from [facility] has been reviewed by the rehabilitation admission coordinator and  physician.   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   Critical lower limb ischemia (HCC) 12/19/2019   Diabetes mellitus      takes Metformin daily   GERD (gastroesophageal reflux disease)      takes Protonix daily   History of blood transfusion      "related to low HgB" ((09/10/2015   Hyperlipidemia      takes Vytorin daily   Hypertension      takes Benazepril and Bystolic daily   PAD (peripheral artery disease) (HCC)     Pneumonia     Radiculopathy, cervical region 02/11/2017   Shortness of breath dyspnea      with exertion   Tobacco user     Toe fracture, right 05/09/2011   Wears glasses            Has the patient had major surgery during 100 days prior to admission? Yes   Family History  family history includes Cancer in his mother and sister; Diabetes in his brother, mother, and sister; Heart disease in his father; Hyperlipidemia in his mother; Hypertension in his father.    Current Medications    Current Medications    Current Facility-Administered Medications:    0.9 %  sodium chloride infusion, 500 mL, Intravenous, Once PRN, Rhyne, Samantha J, PA-C   acetaminophen (TYLENOL) tablet 325-650 mg, 325-650 mg, Oral, Q4H PRN, 650 mg at 08/13/23 1939 **OR** acetaminophen (TYLENOL) suppository 325-650 mg, 325-650 mg, Rectal, Q4H PRN, Rhyne, Samantha J, PA-C   albuterol (PROVENTIL) (2.5 MG/3ML) 0.083% nebulizer solution 2.5 mg, 2.5 mg, Nebulization, Q6H PRN, Rhyne, Samantha J, PA-C   alum & mag hydroxide-simeth (MAALOX/MYLANTA) 200-200-20 MG/5ML suspension 15-30 mL, 15-30 mL, Oral, Q2H PRN, Rhyne, [place], PA-C  aspirin EC tablet 81 mg, 81  mg, Oral, Daily, Rhyne, Samantha J, PA-C, 81 mg at 08/14/23 1005   atorvastatin (LIPITOR) tablet 40 mg, 40 mg, Oral, Daily, Rhyne, Samantha J, PA-C, 40 mg at 08/14/23 1005   baclofen (LIORESAL) tablet 5 mg, 5 mg, Oral, BID PRN, Rhyne, Samantha J, PA-C, 5 mg at 08/14/23 1005   bisacodyl (DULCOLAX) EC tablet 5 mg, 5 mg, Oral, Daily PRN, Rhyne, Samantha J, PA-C, 5 mg at 08/14/23 1005   Chlorhexidine Gluconate Cloth 2 % PADS 6 each, 6 each, Topical, Q0600, Cephus Shelling, MD, 6 each at 08/14/23 1006   clopidogrel (PLAVIX) tablet 75 mg, 75 mg, Oral, Daily, Rhyne, Samantha J, PA-C, 75 mg at 08/14/23 1006   docusate sodium (COLACE) capsule 100 mg, 100 mg, Oral, Daily, Rhyne, Samantha J, PA-C, 100 mg at 08/14/23 1005   ezetimibe (ZETIA) tablet 10 mg, 10 mg, Oral, Daily, Rhyne, Samantha J, PA-C, 10 mg at 08/14/23 1005   FLUoxetine (PROZAC) capsule 10 mg, 10 mg, Oral, Daily, Rhyne, Samantha J, PA-C, 10 mg at 08/14/23 1015   gabapentin (NEURONTIN) capsule 300 mg, 300 mg, Oral, TID, Rhyne, Samantha J, PA-C, 300 mg at 08/14/23 1005   guaiFENesin-dextromethorphan (ROBITUSSIN DM) 100-10 MG/5ML syrup 15 mL, 15 mL, Oral, Q4H PRN, Rhyne, Samantha J, PA-C   heparin injection 5,000 Units, 5,000 Units, Subcutaneous, Q8H, Rhyne, Samantha J, PA-C, 5,000 Units at 08/14/23 0544   hydrALAZINE (APRESOLINE) injection 5 mg, 5 mg, Intravenous, Q20 Min PRN, Rhyne, Samantha J, PA-C   HYDROmorphone (DILAUDID) injection 0.5-1 mg, 0.5-1 mg, Intravenous, Q2H PRN, Rhyne, Samantha J, PA-C, 1 mg at 08/14/23 0809   insulin aspart (novoLOG) injection 0-15 Units, 0-15 Units, Subcutaneous, TID WC, Rhyne, Samantha J, PA-C, 2 Units at 08/14/23 1302   labetalol (NORMODYNE) injection 10 mg, 10 mg, Intravenous, Q10 min PRN, Rhyne, Samantha J, PA-C   lactated ringers infusion, , Intravenous, Continuous, Schuh, McKenzi P, PA-C, Stopped at 08/13/23 0221   magnesium sulfate IVPB 2 g 50 mL, 2 g, Intravenous, Daily PRN, Rhyne, Samantha J, PA-C    metFORMIN (GLUCOPHAGE) tablet 1,000 mg, 1,000 mg, Oral, BID WC, Rhyne, Samantha J, PA-C, 1,000 mg at 08/14/23 1005   metoprolol tartrate (LOPRESSOR) injection 2-5 mg, 2-5 mg, Intravenous, Q2H PRN, Rhyne, Samantha J, PA-C   nebivolol (BYSTOLIC) tablet 20 mg, 20 mg, Oral, Daily, Rhyne, Samantha J, PA-C, 20 mg at 08/14/23 1015   nicotine polacrilex (NICORETTE) gum 2 mg, 2 mg, Oral, PRN, Rhyne, Samantha J, PA-C   ondansetron (ZOFRAN) injection 4 mg, 4 mg, Intravenous, Q6H PRN, Rhyne, Samantha J, PA-C   Oral care mouth rinse, 15 mL, Mouth Rinse, PRN, Rhyne, Samantha J, PA-C   oxyCODONE-acetaminophen (PERCOCET/ROXICET) 5-325 MG per tablet 1-2 tablet, 1-2 tablet, Oral, Q4H PRN, Rhyne, Samantha J, PA-C, 2 tablet at 08/14/23 1005   pantoprazole (PROTONIX) EC tablet 40 mg, 40 mg, Oral, Daily PRN, Rhyne, Samantha J, PA-C   pantoprazole (PROTONIX) EC tablet 40 mg, 40 mg, Oral, Daily, Rhyne, Samantha J, PA-C, 40 mg at 08/14/23 1006   phenol (CHLORASEPTIC) mouth spray 1 spray, 1 spray, Mouth/Throat, PRN, Rhyne, Samantha J, PA-C   polyethylene glycol powder (GLYCOLAX/MIRALAX) container 17 g, 17 g, Oral, Daily, Rhyne, Samantha J, PA-C, 17 g at 08/14/23 1014   potassium chloride SA (KLOR-CON M) CR tablet 20-40 mEq, 20-40 mEq, Oral, Daily PRN, Rhyne, Samantha J, PA-C    Patients Current Diet:  Diet Order  Diet heart healthy/carb modified Room service appropriate? No; Fluid consistency: Thin  Diet effective now                        Precautions / Restrictions Precautions Precautions: Fall Precaution Comments: L AKA Restrictions Weight Bearing Restrictions: No    Has the patient had 2 or more falls or a fall with injury in the past year?No   Prior Activity Level  Mod I at wheelchair level. Did not use his LLE prosthetic.   Prior Functional Level Prior Function Prior Level of Function : Independent/Modified Independent Mobility Comments: using wheelchair in the home due to  R leg giving out when attempting walking with prosthetic. ADLs Comments: mod I with showering with tub bench   Self Care: Did the patient need help bathing, dressing, using the toilet or eating?  Independent   Indoor Mobility: Did the patient need assistance with walking from room to room (with or without device)? Dependent   Stairs: Did the patient need assistance with internal or external stairs (with or without device)? Dependent   Functional Cognition: Did the patient need help planning regular tasks such as shopping or remembering to take medications? Independent   Patient Information Are you of Hispanic, Latino/a,or Spanish origin?: A. No, not of Hispanic, Latino/a, or Spanish origin What is your race?: B. Black or African American Do you need or want an interpreter to communicate with a doctor or health care staff? No   Patient's Response To:  Health Literacy and Transportation Is the patient able to respond to health literacy and transportation needs?: Yes How often do you need to have someone help you when you read instructions, pamphlets, or other written material from your doctor or pharmacy? Never In the past 12 months, has lack of transportation kept you from medical appointments or from getting medications? Never In the past 12 months, has lack of transportation kept you from meetings, work, or from getting things needed for daily living? Never   Journalist, newspaper / Equipment Home Assistive Devices/Equipment: Fish farm manager, Wheelchair Home Equipment: Tub bench, Art gallery manager, Wheelchair - manual, Agricultural consultant (2 wheels), BSC/3in1   Prior Device Use: Indicate devices/aids used by the patient prior to current illness, exacerbation or injury? Manual wheelchair, Motorized wheelchair or scooter   Current Functional Level Cognition   Overall Cognitive Status: Within Functional Limits for tasks assessed Orientation Level: Oriented X4    Extremity  Assessment (includes Sensation/Coordination)   Upper Extremity Assessment: Right hand dominant, RUE deficits/detail, LUE deficits/detail, Generalized weakness RUE Deficits / Details: RCT but able to reach the top of his head RUE Sensation: WNL RUE Coordination: WNL LUE Deficits / Details: RCT but able to reach the top of his head, better AROM with the L LUE Sensation: WNL LUE Coordination: WNL  Lower Extremity Assessment: Defer to PT evaluation RLE Deficits / Details: ankle AROM WFL, incision down medial aspect of leg and thigh and in groin and painful with flexion though able to flex to approx 70 in sitting     ADLs   Overall ADL's : Needs assistance/impaired Eating/Feeding: Independent, Sitting Grooming: Set up, Sitting Upper Body Bathing: Set up, Sitting Lower Body Bathing: Moderate assistance, Sitting/lateral leans Upper Body Dressing : Set up, Sitting Lower Body Dressing: Moderate assistance, Sitting/lateral leans Toilet Transfer: Moderate assistance, +2 for physical assistance     Mobility   Overal bed mobility: Needs Assistance Bed Mobility: Supine to Sit Supine to sit: HOB elevated, Used  rails, Contact guard Sit to supine: Mod assist General bed mobility comments: increased time and effort, but pt able to perform without assist     Transfers   Overall transfer level: Needs assistance Equipment used: None Transfers: Bed to chair/wheelchair/BSC Bed to/from chair/wheelchair/BSC transfer type:: Lateral/scoot transfer Squat pivot transfers: Mod assist, +2 physical assistance  Lateral/Scoot Transfers: Min assist, Contact guard assist General transfer comment: Pt able to lateral scoot into recliner with initial min A with bedpad progressing to CGA. cues for anterior lean and increased bottom clearance     Ambulation / Gait / Stairs / Wheelchair Mobility   Ambulation/Gait General Gait Details: unable baseline     Posture / Balance Dynamic Sitting Balance Sitting balance -  Comments: sitting EOB Balance Overall balance assessment: Needs assistance Sitting-balance support: Feet supported Sitting balance-Leahy Scale: Fair Sitting balance - Comments: sitting EOB Standing balance comment: NT High Level Balance Comments: Sitting EOB: Reahcing forward and to side bil UE x 5, limited shoudler ROM so encoruaged trunk lean but pt not able to go outside BOS.  Trunk rotation to tolerance with assist x 5.     Special needs/care consideration  wound to anterior right non healing wound RLE      Previous Home Environment  Living Arrangements: Spouse/significant other Available Help at Discharge: Family, Available 24 hours/day Type of Home: House Home Layout: One level Home Access: Ramped entrance Bathroom Shower/Tub: Engineer, manufacturing systems: Standard Bathroom Accessibility: Yes How Accessible: Accessible via walker, Accessible via wheelchair Home Care Services: No   Discharge Living Setting Plans for Discharge Living Setting: Patient's home, Lives with (comment) (wife) Type of Home at Discharge: House Discharge Home Layout: One level Discharge Home Access: Ramped entrance Discharge Bathroom Shower/Tub: Tub/shower unit Discharge Bathroom Toilet: Standard Discharge Bathroom Accessibility: Yes Does the patient have any problems obtaining your medications?: No   Social/Family/Support Systems Anticipated Caregiver: wife Anticipated Industrial/product designer Information: [phone]    Goals Patient/Family Goal for Rehab: Mod I PT, OT Expected length of stay: 7-10 days Program Orientation Provided & Reviewed with Pt/Caregiver Including Roles  & Responsibilities: Yes  Barriers to Discharge: Insurance for SNF coverage    Decrease burden of Care through IP rehab admission: n/a    Possible need for SNF placement upon discharge:not anticipated    Patient Condition: This patient's medical and functional status has changed since the consult dated: 08/14/23 in  which the Rehabilitation Physician determined and documented that the patient's condition is appropriate for intensive rehabilitative care in an inpatient rehabilitation facility. See "History of Present Illness" (above) for medical update. Functional changes are: mod assist overa;;. Patient's medical and functional status update has been discussed with the Rehabilitation physician and patient remains appropriate for inpatient rehabilitation. Will admit to inpatient rehab today.   Preadmission Screen Completed By:  Ottie Glazier RN MSN 08/16/23 at 0813 ______________________________________________________________________   Discussed status with Dr. Carlis Abbott on 08/16/23 at 1021 and received approval for admission today.   Admission Coordinator:  Ottie Glazier RN MSN time [id number] Date 08/16/2023                        Revision History

## 2023-08-17 NOTE — Plan of Care (Signed)
  Problem: RH Eating Goal: LTG Patient will perform eating w/assist, cues/equip (OT) Description: LTG: Patient will perform eating with assist, with/without cues using equipment (OT) Flowsheets (Taken 08/17/2023 1243) LTG: Pt will perform eating with assistance level of: Set up assist    Problem: RH Grooming Goal: LTG Patient will perform grooming w/assist,cues/equip (OT) Description: LTG: Patient will perform grooming with assist, with/without cues using equipment (OT) Flowsheets (Taken 08/17/2023 1243) LTG: Pt will perform grooming with assistance level of: Set up assist    Problem: RH Bathing Goal: LTG Patient will bathe all body parts with assist levels (OT) Description: LTG: Patient will bathe all body parts with assist levels (OT) Flowsheets (Taken 08/17/2023 1243) LTG: Pt will perform bathing with assistance level/cueing: Supervision/Verbal cueing   Problem: RH Dressing Goal: LTG Patient will perform upper body dressing (OT) Description: LTG Patient will perform upper body dressing with assist, with/without cues (OT). Flowsheets (Taken 08/17/2023 1243) LTG: Pt will perform upper body dressing with assistance level of: Set up assist Goal: LTG Patient will perform lower body dressing w/assist (OT) Description: LTG: Patient will perform lower body dressing with assist, with/without cues in positioning using equipment (OT) Flowsheets (Taken 08/17/2023 1243) LTG: Pt will perform lower body dressing with assistance level of: Supervision/Verbal cueing   Problem: RH Toileting Goal: LTG Patient will perform toileting task (3/3 steps) with assistance level (OT) Description: LTG: Patient will perform toileting task (3/3 steps) with assistance level (OT)  Flowsheets (Taken 08/17/2023 1243) LTG: Pt will perform toileting task (3/3 steps) with assistance level: Supervision/Verbal cueing   Problem: RH Toilet Transfers Goal: LTG Patient will perform toilet transfers w/assist (OT) Description:  LTG: Patient will perform toilet transfers with assist, with/without cues using equipment (OT) Flowsheets (Taken 08/17/2023 1243) LTG: Pt will perform toilet transfers with assistance level of: Supervision/Verbal cueing   Problem: RH Tub/Shower Transfers Goal: LTG Patient will perform tub/shower transfers w/assist (OT) Description: LTG: Patient will perform tub/shower transfers with assist, with/without cues using equipment (OT) Flowsheets (Taken 08/17/2023 1243) LTG: Pt will perform tub/shower stall transfers with assistance level of: Supervision/Verbal cueing

## 2023-08-17 NOTE — Discharge Instructions (Addendum)
Inpatient Rehab Discharge Instructions  [name] Discharge date and time: No discharge date for patient encounter.   Activities/Precautions/ Functional Status: Activity: activity as tolerated Diet: diabetic diet Wound Care: Routine skin checks Functional status:  ___ No restrictions     ___ Walk up steps independently ___ 24/7 supervision/assistance   ___ Walk up steps with assistance ___ Intermittent supervision/assistance  ___ Bathe/dress independently ___ Walk with walker     _x__ Bathe/dress with assistance ___ Walk Independently    ___ Shower independently ___ Walk with assistance    ___ Shower with assistance ___ No alcohol     ___ Return to work/school ________  Special Instructions:  No driving smoking or alcohol  COMMUNITY REFERRALS UPON DISCHARGE:    Home Health:   PT   OT   RN                  Agency: [facility] Phone:[phone]   Medical Equipment/Items Ordered:HAS ALL NEEDED EQUIPMENT FROM PAST ADMISSIONS                                                 Agency/Supplier:NA    My questions have been answered and I understand these instructions. I will adhere to these goals and the provided educational materials after my discharge from the hospital.  Patient/Caregiver Signature _______________________________ Date __________  Clinician Signature _______________________________________ Date __________  Please bring this form and your medication list with you to all your follow-up doctor's appointments.

## 2023-08-17 NOTE — Progress Notes (Signed)
Physical Therapy Session Note  Patient Details  Name: [name] MRN: [id number] Date of Birth: 1951-10-27  Today's Date: 08/17/2023 PT Individual Time: 1545-1630 PT Individual Time Calculation (min): 45 min   Short Term Goals: Week 1:  PT Short Term Goal 1 (Week 1): STG's=LTG's due to ELOS  Skilled Therapeutic Interventions/Progress Updates:      Therapy Documentation Precautions:  Precautions Precautions: Fall Precaution Comments: L AKA Restrictions Weight Bearing Restrictions: No  Pt received semi-reclined in bed, agreeable to PT session and with 8/10 bilateral shoulder and right LE pain. Nurse notified and administered medication in session. Pt (S) with bed mobility and CGA with squat pivot to w/c. Pt dependently transported for time management to main gym. Pt attempted to perform chest press with tidal tank but unable and deferred 2/2 shoulder pain. Pt transferred to mat and performed reverse sit ups x 4 with 1 # weighted bar but politely declined further reps due to pain. Pt agreeable to LE exercises and performed 3 x 6 bilaterally of hip flexion via marches. Pt transported to room and required min A with squat pivot to bed. Pt left semi-reclined in new air mattress with all needs in reach.     Therapy/Group: Individual Therapy  Truitt Leep Truitt Leep PT, DPT  08/17/2023, 4:58 PM

## 2023-08-17 NOTE — Progress Notes (Signed)
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   Critical lower limb ischemia (HCC) 12/19/2019   Diabetes mellitus    takes Metformin daily   GERD (gastroesophageal reflux disease)    takes Protonix daily   History of blood transfusion    "related to low HgB" ((09/10/2015   Hyperlipidemia    takes Vytorin daily   Hypertension    takes Benazepril and Bystolic daily   PAD (peripheral artery disease) (HCC)    Pneumonia    Radiculopathy, cervical region 02/11/2017   Shortness of breath dyspnea    with exertion   Tobacco user    Toe fracture, right 05/09/2011   Wears glasses    Past Surgical History:  Past Surgical History:  Procedure Laterality Date   ABDOMINAL AORTOGRAM W/LOWER EXTREMITY N/A 12/11/2017   Procedure: ABDOMINAL AORTOGRAM W/LOWER EXTREMITY;  Surgeon: Chuck Hint, MD;  Location: [facility] INVASIVE CV LAB;  Service: Cardiovascular;  Laterality: N/A;   ABDOMINAL AORTOGRAM W/LOWER EXTREMITY Bilateral 04/22/2020   Procedure: ABDOMINAL AORTOGRAM W/LOWER EXTREMITY;  Surgeon: Cephus Shelling, MD;  Location: [facility] INVASIVE CV LAB;  Service: Cardiovascular;  Laterality: Bilateral;   ABDOMINAL AORTOGRAM W/LOWER EXTREMITY N/A 07/27/2023   Procedure: ABDOMINAL AORTOGRAM W/LOWER EXTREMITY;  Surgeon: Cephus Shelling, MD;  Location: [facility] INVASIVE CV LAB;  Service: Cardiovascular;  Laterality: N/A;   ABOVE KNEE LEG  AMPUTATION Left 02/19/2021   AMPUTATION Left 02/19/2021   Procedure: LEFT ABOVE KNEE AMPUTATION;  Surgeon: Leonie Douglas, MD;  Location: [facility] OR;  Service: Vascular;  Laterality: Left;   ANTERIOR CERVICAL DECOMP/DISCECTOMY FUSION  03/08/12   C6-7   ANTERIOR CERVICAL DECOMP/DISCECTOMY FUSION  03/08/2012   Procedure: ANTERIOR CERVICAL DECOMPRESSION/DISCECTOMY FUSION 1 LEVEL/HARDWARE REMOVAL;  Surgeon: Maeola Harman, MD;  Location: [facility] NEURO ORS;  Service: Neurosurgery;  Laterality: N/A;  revison of C5-7 anterior cervical decompression with fusion with Cervical Five-Thoracic One anterior cervical decompression with fusion with interbody prothesis plating and bonegraft   BACK SURGERY  1996   BIOPSY  12/05/2020   Procedure: BIOPSY;  Surgeon: Meryl Dare, MD;  Location: [facility] ENDOSCOPY;  Service: Endoscopy;;   BYPASS GRAFT FEMORAL-PERONEAL Left 11/11/2016   Procedure: REDO LEFT FEMORAL-PERONEAL BYPASS WITH PROPATEN X 80CM GRAFT;  Surgeon: Chuck Hint, MD;  Location: [facility] OR;  Service: Vascular;  Laterality: Left;   BYPASS GRAFT FEMORAL-PERONEAL Right 08/09/2023   Procedure: RIGHT  FEMORAL  ARTERY TO PERONEAL ARTERY BYPASS GRAFT USING GREATER SAPHENOUS VEIN;  Surgeon: Cephus Shelling, MD;  Location: [facility] OR;  Service: Vascular;  Laterality: Right;   COLONOSCOPY W/ BIOPSIES AND POLYPECTOMY  08/17/2012   f/u 5 years, 4 polyps, no high grade dysplasia or malignancy, tubular adenoma, hyperplastic polyops   COLONOSCOPY WITH PROPOFOL N/A 12/05/2020   Procedure: COLONOSCOPY WITH PROPOFOL;  Surgeon: Meryl Dare, MD;  Location: [facility] ENDOSCOPY;  Service: Endoscopy;  Laterality: N/A;   EMBOLECTOMY Left 12/19/2019   Procedure: LEFT FEMERAL-  PERONEAL THROMBECTOMY;  Surgeon: Cephus Shelling, MD;  Location: [facility] OR;  Service: Vascular;  Laterality: Left;   ENDARTERECTOMY FEMORAL Right 04/24/2020   Procedure: RIGHT FEMORAL ENDARTERECTOMY;  Surgeon: Cephus Shelling, MD;  Location: [facility] OR;  Service:  Vascular;  Laterality: Right;   ENTEROSCOPY N/A 12/11/2015   Procedure: ENTEROSCOPY;  Surgeon: Jeani Hawking, MD;  Location: [facility] ENDOSCOPY;  Service: Endoscopy;  Laterality: N/A;   ENTEROSCOPY N/A 03/29/2018   Procedure: ENTEROSCOPY;  Surgeon: Jeani Hawking, MD;  Location: [facility] ENDOSCOPY;  Service: Endoscopy;  Laterality: N/A;   ESOPHAGOGASTRODUODENOSCOPY  08/17/2012   normal esophagus and GEJ, diffuse gastritis with erythema- no malignancy, reactive gastropathy  with focal intestinal metaplasia   ESOPHAGOGASTRODUODENOSCOPY (EGD) WITH PROPOFOL N/A 12/05/2020   Procedure: ESOPHAGOGASTRODUODENOSCOPY (EGD) WITH PROPOFOL;  Surgeon: Meryl Dare, MD;  Location: [facility] ENDOSCOPY;  Service: Endoscopy;  Laterality: N/A;   FEMORAL-POPLITEAL BYPASS GRAFT Left 01/06/2016   Procedure: Left  COMMON FEMORAL-BELOW KNEE POPLITEAL ARTERY Bypass using non-reversed translocated saphenous vein graft from left leg;  Surgeon: Pryor Ochoa, MD;  Location: [facility] OR;  Service: Vascular;  Laterality: Left;   GIVENS CAPSULE STUDY N/A 11/24/2015   Procedure: GIVENS CAPSULE STUDY;  Surgeon: Charna Elizabeth, MD;  Location: [facility] ENDOSCOPY;  Service: Endoscopy;  Laterality: N/A;   HOT HEMOSTASIS N/A 03/29/2018   Procedure: HOT HEMOSTASIS (ARGON PLASMA COAGULATION/BICAP);  Surgeon: Jeani Hawking, MD;  Location: [facility] ENDOSCOPY;  Service: Endoscopy;  Laterality: N/A;   INGUINAL HERNIA REPAIR  1990's   right   INTRAOPERATIVE ARTERIOGRAM Left 01/06/2016   Procedure: INTRA OPERATIVE ARTERIOGRAM LEFT LOWER LEG;  Surgeon: Pryor Ochoa, MD;  Location: [facility] OR;  Service: Vascular;  Laterality: Left;   INTRAOPERATIVE ARTERIOGRAM Left 11/11/2016   Procedure: INTRA OPERATIVE ARTERIOGRAM LEFT LOWER EXTRIMITY;  Surgeon: Chuck Hint, MD;  Location: [facility] OR;  Service: Vascular;  Laterality: Left;   IR ANGIOGRAM FOLLOW UP STUDY  12/11/2017   IR GENERIC HISTORICAL  10/24/2016   IR ANGIOGRAM FOLLOW UP STUDY   LOWER EXTREMITY ANGIOGRAM Bilateral 07/30/2015    Procedure: Lower Extremity Angiogram;  Surgeon: Fransisco Hertz, MD;  Location: [facility] INVASIVE CV LAB;  Service: Cardiovascular;  Laterality: Bilateral;   LUMBAR DISC SURGERY  1996   "lower"   PATCH ANGIOPLASTY Left 12/19/2019   Procedure: LEFT FEMORAL -PERONEAL PATCH ANGIOPLASTY WITH XENOSURE BIOLOGIC PATCH  ;  Surgeon: Cephus Shelling, MD;  Location: [facility] OR;  Service: Vascular;  Laterality: Left;   PATCH ANGIOPLASTY Right 04/24/2020   Procedure: Patch Angioplasty of Right Femoral Artery using Long Xenosure Biologic Patch 1cm x 14 cm;  Surgeon: Cephus Shelling, MD;  Location: [facility] OR;  Service: Vascular;  Laterality: Right;   PERIPHERAL VASCULAR CATHETERIZATION N/A 07/30/2015   Procedure: Abdominal Aortogram;  Surgeon: Fransisco Hertz, MD;  Location: [facility] INVASIVE CV LAB;  Service: Cardiovascular;  Laterality: N/A;   PERIPHERAL VASCULAR CATHETERIZATION N/A 10/24/2016   Procedure: Abdominal Aortogram;  Surgeon: Chuck Hint, MD;  Location: [facility] INVASIVE CV LAB;  Service: Cardiovascular;  Laterality: N/A;   PERIPHERAL VASCULAR CATHETERIZATION N/A 10/24/2016   Procedure: Lower Extremity Angiography;  Surgeon: Chuck Hint, MD;  Location: [facility] INVASIVE CV LAB;  Service: Cardiovascular;  Laterality: N/A;   PERIPHERAL VASCULAR INTERVENTION Right 12/11/2017   Procedure: PERIPHERAL VASCULAR INTERVENTION;  Surgeon: Chuck Hint, MD;  Location: [facility] INVASIVE CV LAB;  Service: Cardiovascular;  Laterality: Right;   PERIPHERAL VASCULAR INTERVENTION Right 07/27/2023   Procedure: PERIPHERAL VASCULAR INTERVENTION;  Surgeon:  Cephus Shelling, MD;  Location: [facility] INVASIVE CV LAB;  Service: Cardiovascular;  Laterality: Right;  Common Illiac   VASCULAR SURGERY  ~ 2007   Stent SFA    VEIN HARVEST Left 01/06/2016   Procedure: LEFT GREATER SAPPHENOUS VEIN HARVEST;  Surgeon: Pryor Ochoa, MD;  Location: [facility] OR;  Service: Vascular;  Laterality: Left;   VEIN HARVEST Right 08/09/2023   Procedure: RIGHT  GREATER SAPHENOUS VEIN HARVEST;  Surgeon: Cephus Shelling, MD;  Location: [facility] OR;  Service: Vascular;  Laterality: Right;   Social History:  reports that he has been smoking cigars. He has been exposed to tobacco smoke. He has never used smokeless tobacco. He reports current alcohol use of about 2.0 standard drinks of alcohol per week. He reports that he does not use drugs.  Family / Support Systems Marital Status: Married Patient Roles: Spouse, Other (Comment) (sibling) Spouse/Significant Other: Tyler Aas [phone] Other Supports: Ashok Cordia  [phone]  Tammy-sister [phone] Anticipated Caregiver: wife Ability/Limitations of Caregiver: wife has limitations and can not provide much assist. Caregiver Availability: 24/7 Family Dynamics: Close knit family with siblings all will try to help, but have limitations of their own. Pt was as doing the best he could prior to admission and was in a wheelchair  Social History Preferred language: English Religion: Baptist Cultural Background: No issues Education: HS Health Literacy - How often do you need to have someone help you when you read instructions, pamphlets, or other written material from your doctor or pharmacy?: Never Writes: Yes Employment Status: Retired Marine scientist Issues: No issues Guardian/Conservator: None-according to MD pt is capable of making his own decisions while here   Abuse/Neglect Abuse/Neglect Assessment Can Be Completed: Yes Physical Abuse: Denies Verbal Abuse: Denies Sexual Abuse: Denies Exploitation of patient/patient's resources: Denies Self-Neglect: Denies  Patient response to: Social Isolation - How often do you feel lonely or isolated from those around you?: Rarely  Emotional Status Pt's affect, behavior and adjustment status: Pt is motivated to transfer on his own and needs to be able too due to wife has issues of her own. He is hopeful with rehab he can do this. Recent Psychosocial  Issues: B-shoulder oissues, diabetic, prior AKA Psychiatric History: No issues would benefit from seeing neuro-psych while here for coping Substance Abuse History: Tobacco aware needs to stop smoking cigars.  Patient / Family Perceptions, Expectations & Goals Pt/Family understanding of illness & functional limitations: Pt is able to explain his bypass and hopes it will improve his function. He needs to be able to transfer better. He talks to the MD and feels understands he plan and treatment moving forward. Premorbid pt/family roles/activities: husband, brother, friend, etc Anticipated changes in roles/activities/participation: resume Pt/family expectations/goals: Pt states: " I need to be able to transfer better for my wife she can only do so much for me."  [employer]: None Premorbid Home Care/DME Agencies: Other (Comment) (has power chair, rw, bsc, ttb manual wheelchair-has had HH in the past) Transportation available at discharge: wife Is the patient able to respond to transportation needs?: Yes In the past 12 months, has lack of transportation kept you from medical appointments or from getting medications?: No In the past 12 months, has lack of transportation kept you from meetings, work, or from getting things needed for daily living?: No  Discharge Planning Living Arrangements: Spouse/significant other Support Systems: Spouse/significant other, Other relatives, Friends/neighbors Type of Residence: Private residence Insurance Resources: [employer] (specify county), Media planner (specify) Administrator Medicare) Surveyor, quantity  Resources: Tree surgeon, Family Support Financial Screen Referred: No Living Expenses: Own Money Management: Patient, Spouse Does the patient have any problems obtaining your medications?: No Home Management: wife Patient/Family Preliminary Plans: Return home with wife who can be there but not provide physical assist due to own health  issues. Will await team's evaluations and work on safe plan for him. Care Coordinator Barriers to Discharge: Lack of/limited family support, Decreased caregiver support, Weight bearing restrictions Care Coordinator Anticipated Follow Up Needs: HH/OP  Clinical Impression Pleasant gentleman who will need to do well and get to supervision level due to wife has limitations of her own. Will await evaluations and work on discharge needs. Will try to figure out the [facility] agency he had last year since he liked them.  Lucy Chris 08/17/2023, 11:02 AM

## 2023-08-17 NOTE — Progress Notes (Signed)
Inpatient Rehabilitation Center Individual Statement of Services  Patient Name:  [name]  Date:  08/17/2023  Welcome to the Inpatient Rehabilitation Center.  Our goal is to provide you with an individualized program based on your diagnosis and situation, designed to meet your specific needs.  With this comprehensive rehabilitation program, you will be expected to participate in at least 3 hours of rehabilitation therapies Monday-Friday, with modified therapy programming on the weekends.  Your rehabilitation program will include the following services:  Physical Therapy (PT), Occupational Therapy (OT), 24 hour per day rehabilitation nursing, Neuropsychology, Care Coordinator, Rehabilitation Medicine, Nutrition Services, and Pharmacy Services  Weekly team conferences will be held on Tuesday to discuss your progress.  Your Inpatient Rehabilitation Care Coordinator will talk with you frequently to get your input and to update you on team discussions.  Team conferences with you and your family in attendance may also be held.  Expected length of stay: 7-10 days  Overall anticipated outcome: supervision-mod/I level  Depending on your progress and recovery, your program may change. Your Inpatient Rehabilitation Care Coordinator will coordinate services and will keep you informed of any changes. Your Inpatient Rehabilitation Care Coordinator's name and contact numbers are listed  below.  The following services may also be recommended but are not provided by the Inpatient Rehabilitation Center:   Home Health Rehabiltiation Services Outpatient Rehabilitation Services    Arrangements will be made to provide these services after discharge if needed.  Arrangements include referral to agencies that provide these services.  Your insurance has been verified to be:  [employer] & Medicaid Your primary doctor is:  Erick Alley  Pertinent information will be shared with your doctor and your insurance  company.  Inpatient Rehabilitation Care Coordinator:  Dossie Der, [place] [phone] or Luna Glasgow  Information discussed with and copy given to patient by: Lucy Chris, 08/17/2023, 11:07 AM

## 2023-08-17 NOTE — Evaluation (Signed)
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   Critical lower limb ischemia (HCC) 12/19/2019   Diabetes mellitus    takes Metformin daily   GERD (gastroesophageal reflux disease)    takes Protonix daily   History of blood transfusion    "related to low HgB" ((09/10/2015   Hyperlipidemia    takes Vytorin daily   Hypertension    takes Benazepril and Bystolic daily   PAD (peripheral artery disease) (HCC)    Pneumonia    Radiculopathy, cervical region 02/11/2017   Shortness of breath dyspnea    with exertion   Tobacco user    Toe fracture, right 05/09/2011   Wears glasses    Past Surgical History:  Past Surgical History:  Procedure Laterality Date   ABDOMINAL AORTOGRAM W/LOWER EXTREMITY N/A 12/11/2017   Procedure: ABDOMINAL AORTOGRAM W/LOWER EXTREMITY;  Surgeon: Chuck Hint, MD;  Location: [facility] INVASIVE CV LAB;  Service: Cardiovascular;  Laterality: N/A;   ABDOMINAL AORTOGRAM W/LOWER EXTREMITY Bilateral 04/22/2020   Procedure: ABDOMINAL AORTOGRAM W/LOWER EXTREMITY;  Surgeon: Cephus Shelling, MD;  Location: [facility] INVASIVE CV LAB;  Service: Cardiovascular;  Laterality: Bilateral;   ABDOMINAL AORTOGRAM W/LOWER EXTREMITY N/A 07/27/2023    Procedure: ABDOMINAL AORTOGRAM W/LOWER EXTREMITY;  Surgeon: Cephus Shelling, MD;  Location: [facility] INVASIVE CV LAB;  Service: Cardiovascular;  Laterality: N/A;   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   AMPUTATION Left 02/19/2021   Procedure: LEFT ABOVE KNEE AMPUTATION;  Surgeon: Leonie Douglas, MD;  Location: [facility] OR;  Service: Vascular;  Laterality: Left;   ANTERIOR CERVICAL DECOMP/DISCECTOMY FUSION  03/08/12   C6-7   ANTERIOR CERVICAL DECOMP/DISCECTOMY FUSION  03/08/2012   Procedure: ANTERIOR CERVICAL DECOMPRESSION/DISCECTOMY FUSION 1 LEVEL/HARDWARE REMOVAL;  Surgeon: Maeola Harman, MD;  Location: [facility] NEURO ORS;  Service: Neurosurgery;  Laterality: N/A;  revison of C5-7 anterior cervical decompression with fusion with Cervical Five-Thoracic One anterior cervical decompression with fusion with interbody prothesis plating and bonegraft   BACK SURGERY  1996   BIOPSY  12/05/2020   Procedure: BIOPSY;  Surgeon: Meryl Dare, MD;  Location: [facility] ENDOSCOPY;  Service: Endoscopy;;   BYPASS GRAFT FEMORAL-PERONEAL Left 11/11/2016   Procedure: REDO LEFT FEMORAL-PERONEAL BYPASS WITH PROPATEN X 80CM GRAFT;  Surgeon: Chuck Hint, MD;  Location: [facility] OR;  Service: Vascular;  Laterality: Left;   BYPASS GRAFT FEMORAL-PERONEAL Right 08/09/2023   Procedure: RIGHT  FEMORAL  ARTERY TO PERONEAL ARTERY BYPASS GRAFT USING GREATER SAPHENOUS VEIN;  Surgeon: Cephus Shelling, MD;  Location: [facility] OR;  Service: Vascular;  Laterality: Right;   COLONOSCOPY W/ BIOPSIES AND POLYPECTOMY  08/17/2012   f/u 5 years, 4 polyps, no high grade dysplasia or malignancy, tubular adenoma, hyperplastic polyops   COLONOSCOPY WITH PROPOFOL N/A 12/05/2020  Procedure: COLONOSCOPY WITH PROPOFOL;  Surgeon: Meryl Dare, MD;  Location: [facility] ENDOSCOPY;  Service: Endoscopy;  Laterality: N/A;   EMBOLECTOMY Left 12/19/2019   Procedure: LEFT FEMERAL- PERONEAL THROMBECTOMY;  Surgeon: Cephus Shelling, MD;  Location: [facility] OR;  Service:  Vascular;  Laterality: Left;   ENDARTERECTOMY FEMORAL Right 04/24/2020   Procedure: RIGHT FEMORAL ENDARTERECTOMY;  Surgeon: Cephus Shelling, MD;  Location: [facility] OR;  Service: Vascular;  Laterality: Right;   ENTEROSCOPY N/A 12/11/2015   Procedure: ENTEROSCOPY;  Surgeon: Jeani Hawking, MD;  Location: [facility] ENDOSCOPY;  Service: Endoscopy;  Laterality: N/A;   ENTEROSCOPY N/A 03/29/2018   Procedure: ENTEROSCOPY;  Surgeon: Jeani Hawking, MD;  Location: [facility] ENDOSCOPY;  Service: Endoscopy;  Laterality: N/A;   ESOPHAGOGASTRODUODENOSCOPY  08/17/2012   normal esophagus and GEJ, diffuse gastritis with erythema- no malignancy, reactive gastropathy  with focal intestinal metaplasia   ESOPHAGOGASTRODUODENOSCOPY (EGD) WITH PROPOFOL N/A 12/05/2020   Procedure: ESOPHAGOGASTRODUODENOSCOPY (EGD) WITH PROPOFOL;  Surgeon: Meryl Dare, MD;  Location: [facility] ENDOSCOPY;  Service: Endoscopy;  Laterality: N/A;   FEMORAL-POPLITEAL BYPASS GRAFT Left 01/06/2016   Procedure: Left  COMMON FEMORAL-BELOW KNEE POPLITEAL ARTERY Bypass using non-reversed translocated saphenous vein graft from left leg;  Surgeon: Pryor Ochoa, MD;  Location: [facility] OR;  Service: Vascular;  Laterality: Left;   GIVENS CAPSULE STUDY N/A 11/24/2015   Procedure: GIVENS CAPSULE STUDY;  Surgeon: Charna Elizabeth, MD;  Location: [facility] ENDOSCOPY;  Service: Endoscopy;  Laterality: N/A;   HOT HEMOSTASIS N/A 03/29/2018   Procedure: HOT HEMOSTASIS (ARGON PLASMA COAGULATION/BICAP);  Surgeon: Jeani Hawking, MD;  Location: [facility] ENDOSCOPY;  Service: Endoscopy;  Laterality: N/A;   INGUINAL HERNIA REPAIR  1990's   right   INTRAOPERATIVE ARTERIOGRAM Left 01/06/2016   Procedure: INTRA OPERATIVE ARTERIOGRAM LEFT LOWER LEG;  Surgeon: Pryor Ochoa, MD;  Location: [facility] OR;  Service: Vascular;  Laterality: Left;   INTRAOPERATIVE ARTERIOGRAM Left 11/11/2016   Procedure: INTRA OPERATIVE ARTERIOGRAM LEFT LOWER EXTRIMITY;  Surgeon: Chuck Hint, MD;  Location: [facility] OR;  Service: Vascular;   Laterality: Left;   IR ANGIOGRAM FOLLOW UP STUDY  12/11/2017   IR GENERIC HISTORICAL  10/24/2016   IR ANGIOGRAM FOLLOW UP STUDY   LOWER EXTREMITY ANGIOGRAM Bilateral 07/30/2015   Procedure: Lower Extremity Angiogram;  Surgeon: Fransisco Hertz, MD;  Location: [facility] INVASIVE CV LAB;  Service: Cardiovascular;  Laterality: Bilateral;   LUMBAR DISC SURGERY  1996   "lower"   PATCH ANGIOPLASTY Left 12/19/2019   Procedure: LEFT FEMORAL -PERONEAL PATCH ANGIOPLASTY WITH XENOSURE BIOLOGIC PATCH  ;  Surgeon: Cephus Shelling, MD;  Location: [facility] OR;  Service: Vascular;  Laterality: Left;   PATCH ANGIOPLASTY Right 04/24/2020   Procedure: Patch Angioplasty of Right Femoral Artery using Long Xenosure Biologic Patch 1cm x 14 cm;  Surgeon: Cephus Shelling, MD;  Location: [facility] OR;  Service: Vascular;  Laterality: Right;   PERIPHERAL VASCULAR CATHETERIZATION N/A 07/30/2015   Procedure: Abdominal Aortogram;  Surgeon: Fransisco Hertz, MD;  Location: [facility] INVASIVE CV LAB;  Service: Cardiovascular;  Laterality: N/A;   PERIPHERAL VASCULAR CATHETERIZATION N/A 10/24/2016   Procedure: Abdominal Aortogram;  Surgeon: Chuck Hint, MD;  Location: [facility] INVASIVE CV LAB;  Service: Cardiovascular;  Laterality: N/A;   PERIPHERAL VASCULAR CATHETERIZATION N/A 10/24/2016   Procedure: Lower Extremity Angiography;  Surgeon: Chuck Hint, MD;  Location: [facility] INVASIVE CV LAB;  Service: Cardiovascular;  Laterality: N/A;   PERIPHERAL VASCULAR INTERVENTION Right 12/11/2017   Procedure: PERIPHERAL VASCULAR INTERVENTION;  Surgeon: Edilia Bo,  Di Kindle, MD;  Location: [facility] INVASIVE CV LAB;  Service: Cardiovascular;  Laterality: Right;   PERIPHERAL VASCULAR INTERVENTION Right 07/27/2023   Procedure: PERIPHERAL VASCULAR INTERVENTION;  Surgeon: Cephus Shelling, MD;  Location: [facility] INVASIVE CV LAB;  Service: Cardiovascular;  Laterality: Right;  Common Illiac   VASCULAR SURGERY  ~ 2007   Stent SFA    VEIN HARVEST Left 01/06/2016    Procedure: LEFT GREATER SAPPHENOUS VEIN HARVEST;  Surgeon: Pryor Ochoa, MD;  Location: [facility] OR;  Service: Vascular;  Laterality: Left;   VEIN HARVEST Right 08/09/2023   Procedure: RIGHT GREATER SAPHENOUS VEIN HARVEST;  Surgeon: Cephus Shelling, MD;  Location: [facility] OR;  Service: Vascular;  Laterality: Right;    Assessment & Plan Clinical Impression: Coron Graser. 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 Lenell Antu. Per chart review patient lives with spouse. 1 level home with a ramped entrance. Primarily uses a wheelchair in the home due to right leg giving out when attempting walking with left lower extremity prosthesis. Presented 08/09/2023 with significant pain of the right foot x 3 weeks as well as discoloration which progressed to some wounds on the toes and significant pain in the foot. He was seen by podiatrist Dr. Annamary Rummage on 7/26 who recommended urgent evaluation with vascular surgery concerns of ischemia of right foot. He has known SFA occlusion with single-vessel peroneal runoff. ABIs decreased from prior study 6 months ago. Patient underwent redo exposure of right common femoral artery harvest of right lower extremity great saphenous vein with right common femoral artery to peroneal artery bypass with ipsilateral reversed great saphenous vein 08/09/2023 per Dr. Chestine Spore. Patient remains on low-dose aspirin and Plavix as prior to admission. Subcutaneous heparin added for DVT prophylaxis. Acute on chronic anemia latest hemoglobin 9.0. Therapy evaluations completed due to patient decreased functional mobility was admitted for a comprehensive rehab program. .  Patient transferred to CIR on 08/16/2023 . Patient currently requires mod with basic self-care skills  secondary to muscle weakness, decreased cardiorespiratoy endurance, and decreased sitting balance, decreased standing balance, decreased postural control, and decreased balance strategies.  Prior to hospitalization, patient could complete ADLs with modified independent .  Patient will benefit from skilled intervention to decrease level of assist with basic self-care skills and increase independence with basic self-care skills prior to discharge home with care partner.  Anticipate patient will require intermittent supervision and follow up home health.  OT - End of Session Activity Tolerance: Tolerates 10 - 20 min activity with multiple rests Endurance Deficit: Yes Endurance Deficit Description: decreased OT Assessment Rehab Potential (ACUTE ONLY): Good OT Patient demonstrates impairments in the following area(s): Balance;Endurance;Motor;Pain OT Basic ADL's Functional Problem(s): Eating;Grooming;Bathing;Dressing;Toileting OT Transfers Functional Problem(s): Toilet;Tub/Shower OT Additional Impairment(s): None OT Plan OT Intensity: Minimum of 1-2 x/day, 45 to 90 minutes OT Frequency: 5 out of 7 days OT Duration/Estimated Length of Stay: 7-10 days OT Treatment/Interventions: Balance/vestibular training;Discharge planning;Pain management;Self Care/advanced ADL retraining;Therapeutic Activities;Functional mobility training;Patient/family education;Skin care/wound managment;Therapeutic Exercise;Community reintegration;DME/adaptive equipment instruction;Neuromuscular re-education;UE/LE Strength taining/ROM;Wheelchair propulsion/positioning OT Self Feeding Anticipated Outcome(s): set-up OT Basic Self-Care Anticipated Outcome(s): supervision OT Toileting Anticipated Outcome(s): supervision OT Bathroom Transfers Anticipated Outcome(s): supervision OT Recommendation Recommendations for Other Services: Therapeutic Recreation consult Therapeutic Recreation Interventions: Pet therapy;Outing/community  reintergration Patient destination: Home Follow Up Recommendations: Home health OT Equipment Recommended: To be  determined  OT Evaluation Precautions/Restrictions  Precautions Precautions: Fall Precaution Comments: L AKA Restrictions Weight Bearing Restrictions: No General Chart Reviewed: Yes Family/Caregiver Present: No Pain Pain Assessment Pain Score: 9  Pain Type: Surgical pain Pain Location: Leg Pain Orientation: Right Multiple Pain Sites: No Home Living/Prior Functioning Home Living Family/patient expects to be discharged to:: Private residence Living Arrangements: Spouse/significant other Available Help at Discharge: Family, Available 24 hours/day Type of Home: House Home Access: Ramped entrance Home Layout: One level, Able to live on main level with bedroom/bathroom Bathroom Shower/Tub: Tub/shower unit, Engineer, building services: Handicapped height Bathroom Accessibility: No Additional Comments: Lt prothetic, heightened toilet, RW, W/C, tub bench  Lives With: Spouse IADL History Current License: Yes Mode of Transportation: Car Prior Function Level of Independence: Requires assistive device for independence  Able to Take Stairs?: No Driving: Yes Vocation: On disability Leisure: Hobbies-yes (Comment) (gardening) Vision Baseline Vision/History: 0 No visual deficits Ability to See in Adequate Light: 0 Adequate Patient Visual Report: No change from baseline Vision Assessment?: No apparent visual deficits Perception  Perception: Within Functional Limits Praxis Praxis: WFL Cognition Cognition Overall Cognitive Status: Within Functional Limits for tasks assessed Arousal/Alertness: Awake/alert Orientation Level: Person;Place;Situation Person: Oriented Place: Oriented Situation: Oriented Memory: Appears intact Awareness: Appears intact Problem Solving: Appears intact Safety/Judgment: Appears intact Brief Interview for Mental Status (BIMS) Repetition of  Three Words (First Attempt): 3 Temporal Orientation: Year: Correct Temporal Orientation: Month: Accurate within 5 days Temporal Orientation: Day: Correct Recall: "Sock": Yes, no cue required Recall: "Blue": Yes, no cue required Recall: "Bed": Yes, no cue required BIMS Summary Score: 15 Sensation Sensation Light Touch: Impaired by gross assessment (numbness and tingling in RLE) Hot/Cold: Appears Intact Proprioception: Appears Intact Stereognosis: Not tested Additional Comments: right LE diminshed L4, L5 Coordination Gross Motor Movements are Fluid and Coordinated: No Fine Motor Movements are Fluid and Coordinated: Yes Coordination and Movement Description: grossly uncoordinated 2/2 L AKA and pain Finger Nose Finger Test: [facility] Heel Shin Test: WFL R LE, UTA L LE 2/2 AKA Motor  Motor Motor: Other (comment) Motor - Skilled Clinical Observations: L AKA (2022)  Trunk/Postural Assessment  Cervical Assessment Cervical Assessment: Exceptions to [facility] (foward head) Thoracic Assessment Thoracic Assessment: Exceptions to [facility] (rounded shoulders) Lumbar Assessment Lumbar Assessment: Exceptions to [facility] (posterior pelvic tilt) Postural Control Postural Control: Deficits on evaluation Protective Responses: delayed righting reactions  Balance Balance Balance Assessed: Yes Static Sitting Balance Static Sitting - Balance Support: Bilateral upper extremity supported Static Sitting - Level of Assistance: 5: Stand by assistance Dynamic Sitting Balance Dynamic Sitting - Balance Support: Bilateral upper extremity supported Dynamic Sitting - Level of Assistance: 5: Stand by assistance Dynamic Sitting - Balance Activities: Lateral lean/weight shifting;Reaching for objects Extremity/Trunk Assessment RUE Assessment RUE Assessment: Exceptions to [facility] Active Range of Motion (AROM) Comments: ~100* shoulder flexion d/t prior rotator cuff issues, all other [facility] General Strength Comments: proximal 3/5, distal  4/5 LUE Assessment LUE Assessment: Exceptions to [facility] Active Range of Motion (AROM) Comments: ~100* shoulder flexion d/t prior rotator cuff issues, all other WFL General Strength Comments: proximal 3/5, distal 4/5  Care Tool Care Tool Self Care Eating   Eating Assist Level: Set up assist    Oral Care    Oral Care Assist Level: Supervision/Verbal cueing    Bathing   Body parts bathed by patient: Right arm;Left arm;Chest;Abdomen;Front perineal area;Right upper leg;Left upper leg Body parts bathed by helper: Buttocks;Right lower leg Body parts n/a: Left lower leg (L AKA) Assist Level: Minimal Assistance -  Patient > 75%    Upper Body Dressing(including orthotics)   What is the patient wearing?: Pull over shirt   Assist Level: Contact Guard/Touching assist    Lower Body Dressing (excluding footwear)     Assist for lower body dressing: Minimal Assistance - Patient > 75%    Putting on/Taking off footwear   What is the patient wearing?: Socks Assist for footwear: Minimal Assistance - Patient > 75% (with sock aide)       Care Tool Toileting Toileting activity   Assist for toileting: Maximal Assistance - Patient 25 - 49%     Care Tool Bed Mobility Roll left and right activity   Roll left and right assist level: Supervision/Verbal cueing    Sit to lying activity   Sit to lying assist level: Supervision/Verbal cueing    Lying to sitting on side of bed activity   Lying to sitting on side of bed assist level: the ability to move from lying on the back to sitting on the side of the bed with no back support.: Supervision/Verbal cueing     Care Tool Transfers Sit to stand transfer   Sit to stand assist level: Moderate Assistance - Patient 50 - 74%    Chair/bed transfer   Chair/bed transfer assist level: Moderate Assistance - Patient 50 - 74%     Toilet transfer Toilet transfer activity did not occur: Safety/medical concerns (fatigue, pain) Assist Level: Moderate Assistance -  Patient 50 - 74%     Care Tool Cognition  Expression of Ideas and Wants Expression of Ideas and Wants: 4. Without difficulty (complex and basic) - expresses complex messages without difficulty and with speech that is clear and easy to understand  Understanding Verbal and Non-Verbal Content Understanding Verbal and Non-Verbal Content: 4. Understands (complex and basic) - clear comprehension without cues or repetitions   Memory/Recall Ability Memory/Recall Ability : Current season;That he or she is in a hospital/hospital unit   Refer to Care Plan for Long Term Goals  SHORT TERM GOAL WEEK 1 OT Short Term Goal 1 (Week 1): LTG=STG d/t ELOS  Recommendations for other services: Therapeutic Recreation  Pet therapy and Outing/community reintegration   Skilled Therapeutic Intervention ADL ADL Equipment Provided: Reacher;Sock aid Eating: Set up Where Assessed-Eating: Bed level Grooming: Supervision/safety Where Assessed-Grooming: Edge of bed Upper Body Bathing: Minimal assistance (simulated) Where Assessed-Upper Body Bathing: Edge of bed Lower Body Bathing: Moderate assistance Where Assessed-Lower Body Bathing: Edge of bed Upper Body Dressing: Supervision/safety Where Assessed-Upper Body Dressing: Edge of bed Lower Body Dressing: Minimal assistance Where Assessed-Lower Body Dressing: Edge of bed Toileting: Maximal assistance Where Assessed-Toileting: Bedside Commode Toilet Transfer: Moderate assistance Toilet Transfer Method: Squat pivot Toilet Transfer Equipment: Drop arm bedside commode Tub/Shower Transfer: Unable to assess Tub/Shower Transfer Method: Unable to assess Film/video editor: Unable to assess [employer] Method: Unable to assess ADL Comments: Increased pain this date limited ADLs. Pt reported already washing up in AM so refused to bathe, simulated bathing instead. Pt completed lateral leaning in bed to manage pants over hips Mobility  Bed Mobility Bed  Mobility: Rolling Right;Rolling Left;Supine to Sit;Sit to Supine Rolling Right: Supervision/verbal cueing Rolling Left: Supervision/Verbal cueing Supine to Sit: Supervision/Verbal cueing Sit to Supine: Supervision/Verbal cueing Transfers Sit to Stand: Moderate Assistance - Patient 50-74% Stand to Sit: Moderate Assistance - Patient 50-74%  1:1 evaluation and treatment session initiated this date. OT roles, goals and purpose discussed with pt as well as therapy schedule. ADL completed this date  with levels of assist listed above. Pt with increased pain this date limiting independence with ADLs. Pt using reacher for donning pants over Rt leg with Min A and lateral leaning to manage pants over hips. Pt use of sock aide for donning sock on Rt leg. Pt would benefit from skilled OT in IPR setting in order to maximize independence with ADLs upon D/C.    Discharge Criteria: Patient will be discharged from OT if patient refuses treatment 3 consecutive times without medical reason, if treatment goals not met, if there is a change in medical status, if patient makes no progress towards goals or if patient is discharged from hospital.  The above assessment, treatment plan, treatment alternatives and goals were discussed and mutually agreed upon: by patient  Velia Meyer, OTD, OTR/L 08/17/2023, 12:41 PM

## 2023-08-18 DIAGNOSIS — I739 Peripheral vascular disease, unspecified: Secondary | ICD-10-CM | POA: Diagnosis not present

## 2023-08-18 LAB — GLUCOSE, CAPILLARY
Glucose-Capillary: 99 mg/dL (ref 70–99)
Glucose-Capillary: 131 mg/dL — ABNORMAL HIGH (ref 70–99)
Glucose-Capillary: 92 mg/dL (ref 70–99)
Glucose-Capillary: 100 mg/dL — ABNORMAL HIGH (ref 70–99)

## 2023-08-18 MED ORDER — MIDODRINE HCL 5 MG PO TABS
2.5000 mg | ORAL_TABLET | Freq: Three times a day (TID) | ORAL | Status: DC
  Administered 2023-08-18 – 2023-08-21 (×9): 2.5 mg via ORAL
  Filled 2023-08-18 (×9): qty 1

## 2023-08-18 NOTE — Progress Notes (Signed)
Occupational Therapy Session Note  Patient Details  Name: [name] MRN: [id number] Date of Birth: Oct 23, 1951  Today's Date: 08/18/2023 OT Individual Time: 1191-4782 OT Individual Time Calculation (min): 42 min    Short Term Goals: Week 1:  OT Short Term Goal 1 (Week 1): LTG=STG d/t ELOS  Skilled Therapeutic Interventions/Progress Updates:  Pt received resting in bed for skilled OT session with focus on general conditioning. Pt agreeable to interventions, demonstrating overall pleasant mood. Pt reported 7/10 surgical pain. OT offering intermediate rest breaks and positioning suggestions throughout session to address pain/fatigue and maximize participation/safety in session.   Pt performs all squat-pivot transfers with CGA + assistance provided for [facility] part management, including arm/leg rests. Pt propelled from room<>ortho gym for pain management, as patient has bilateral chronic shoulder pain from frequent falls. In ortho gym, pt participates in series of core strengthening, details below: Modified sit-ups  Torso Twists (2# medicine ball)   Pt completes 3x10 reps with multimodal cuing for correct form.   Standing activities deferred this session due to increased sensitivity/sharp/shooting pain in RLE with surgical site red and draining minimally. RN updated.   Pt remained sitting in recliner with all immediate needs met at end of session. Pt continues to be appropriate for skilled OT intervention to promote further functional independence.   Therapy Documentation Precautions:  Precautions Precautions: Fall Precaution Comments: L AKA Restrictions Weight Bearing Restrictions: No   Therapy/Group: Individual Therapy  Lou Cal, OTR/L, MSOT  08/18/2023, 5:25 AM

## 2023-08-18 NOTE — Progress Notes (Signed)
Occupational Therapy Session Note  Patient Details  Name: [name] MRN: [id number] Date of Birth: [birth year]  Today's Date: 08/18/2023 OT Individual Time: 1403-1500 OT Individual Time Calculation (min): 57 min    Short Term Goals: Week 1:  OT Short Term Goal 1 (Week 1): LTG=STG d/t ELOS  Skilled Therapeutic Interventions/Progress Updates:    Pt received supine with 8/10 pain in his RLE but reporting he is premedicated and agreeable to OT session. He came to EOB with (S). Squat pivot transfer toward the R with CGA to the w/c. He was agreeable to BUE therex but not to leaving the room. Pt completed BUE dumbbell/theraband therex with level 2 resistance band and 4lb dumbbells. Completed to challenge BUE strength and endurance required for maximal independence with ADLs and ADL transfers. Pt completed bicep curls, cross body deltoid raise, tricep extension, lat pull downs and rows. Cueing required to ensure proper form and technique for proper muscle activation. 3x10 repetitions with rest breaks between each set. Pt was left sitting up in the w/c with all needs met and call bell within reach, awaiting PT arrival.    Therapy Documentation Precautions:  Precautions Precautions: Fall Precaution Comments: L AKA Restrictions Weight Bearing Restrictions: No  Therapy/Group: Individual Therapy  [name] 08/18/2023, 6:29 AM

## 2023-08-18 NOTE — IPOC Note (Signed)
Overall Plan of Care [facility]) Patient Details Name: [name] MRN: [id number] DOB: [birth year]  Admitting Diagnosis: PAD (peripheral artery disease) [facility])  Hospital Problems: Principal Problem:   PAD (peripheral artery disease) (HCC)     Functional Problem List: Nursing Skin Integrity, Endurance, Motor, Pain, Safety, Sensory  PT Balance, Pain, Edema, Endurance, Sensory, Motor, Skin Integrity  OT Balance, Endurance, Motor, Pain  SLP    TR         Basic ADL's: OT Eating, Grooming, Bathing, Dressing, Toileting     Advanced  ADL's: OT       Transfers: PT Bed Mobility, Bed to Chair, Customer service manager, Tub/Shower     Locomotion: PT Wheelchair Mobility     Additional Impairments: OT None  SLP        TR      Anticipated Outcomes Item Anticipated Outcome  Self Feeding set-up  Swallowing      Basic self-care  supervision  Toileting  supervision   Bathroom Transfers supervision  Bowel/Bladder  continent bowel/bladder  Transfers  Mod I  Locomotion  Mod I w/c  Communication     Cognition     Pain  less than 4  Safety/Judgment  no falls   Therapy Plan: PT Intensity: Minimum of 1-2 x/day ,45 to 90 minutes PT Frequency: 5 out of 7 days PT Duration Estimated Length of Stay: 7-10 days OT Intensity: Minimum of 1-2 x/day, 45 to 90 minutes OT Frequency: 5 out of 7 days OT Duration/Estimated Length of Stay: 7-10 days     Team Interventions: Nursing Interventions Patient/Family Education, Skin Care/Wound Management, Disease Management/Prevention, Pain Management, Discharge Planning  PT interventions Discharge planning, DME/adaptive equipment instruction, Functional mobility training, Pain management, Psychosocial support, Therapeutic Activities, UE/LE Strength taining/ROM, UE/LE Coordination activities, Patient/family education, Neuromuscular re-education, Disease management/prevention, Warden/ranger, Community reintegration  OT Interventions  Warden/ranger, Discharge planning, Pain management, Self Care/advanced ADL retraining, Therapeutic Activities, Functional mobility training, Patient/family education, Skin care/wound managment, Therapeutic Exercise, Community reintegration, Fish farm manager, Neuromuscular re-education, UE/LE Strength taining/ROM, Wheelchair propulsion/positioning  SLP Interventions    TR Interventions    SW/CM Interventions Discharge Planning, Psychosocial Support, Patient/Family Education   Barriers to Discharge MD  Medical stability, Home enviroment access/loayout, New diabetic, Wound care, Weight, and Weight bearing restrictions  Nursing Decreased caregiver support, Wound Care, Weight bearing restrictions home with spouse to 1 level with ramp entrance  PT Decreased caregiver support spouse unable to physically assist  OT      SLP      SW Lack of/limited family support, Decreased caregiver support, Weight bearing restrictions     Team Discharge Planning: Destination: PT-Home ,OT- Home , SLP-  Projected Follow-up: PT-Home health PT, OT-  Home health OT, SLP-  Projected Equipment Needs: PT-To be determined, OT- To be determined, SLP-  Equipment Details: PT-owns manual wheelchair, power wheelchair, OT-  Patient/family involved in discharge planning: PT- Patient,  OT-Patient, SLP-   MD ELOS: 7-10 days Medical Rehab Prognosis:  Good Assessment: The patient has been admitted for CIR therapies with the diagnosis of R common femoral/peroneal bypass with L AKA. The team will be addressing functional mobility, strength, stamina, balance, safety, adaptive techniques and equipment, self-care, bowel and bladder mgt, patient and caregiver education, wound care. Goals have been set at mod I to supervision. Anticipated discharge destination is home.        See Team Conference Notes for weekly updates to the plan of care

## 2023-08-18 NOTE — Progress Notes (Signed)
PROGRESS NOTE   Subjective/Complaints:   Pt reports his wife brought in his L AKA prosthesis yesterday.   Slept MUCH better with air bed and melatonin- wants ot keep things that way- no changes.  LBM yesterday Pain 7/10 this AM, but last meds ~ 1am.    ROS:  Pt denies SOB, abd pain, CP, N/V/C/D, and vision changes  Except for HPI  Objective:   No results found. Recent Labs    08/16/23 1844 08/17/23 0523  WBC 5.0 4.2  HGB 9.3* 9.4*  HCT 32.8* 33.5*  PLT 318 296   Recent Labs    08/16/23 1844 08/17/23 0523  NA  --  137  K  --  3.8  CL  --  98  CO2  --  29  GLUCOSE  --  98  BUN  --  12  CREATININE 0.84 0.80  CALCIUM  --  8.3*    Intake/Output Summary (Last 24 hours) at 08/18/2023 0847 Last data filed at 08/18/2023 0520 Gross per 24 hour  Intake 960 ml  Output 200 ml  Net 760 ml     Pressure Injury 08/16/23 Buttocks Left Stage 2 -  Partial thickness loss of dermis presenting as a shallow open injury with a red, pink wound bed without slough. Small stage 2 just inside left buttock (Active)  08/16/23 1720  Location: Buttocks  Location Orientation: Left  Staging: Stage 2 -  Partial thickness loss of dermis presenting as a shallow open injury with a red, pink wound bed without slough.  Wound Description (Comments): Small stage 2 just inside left buttock  Present on Admission: Yes    Physical Exam: Vital Signs Blood pressure 126/66, pulse 65, temperature 98.2 F (36.8 C), temperature source Oral, resp. rate 18, height 5\' 7"  (1.702 m), weight 82.6 kg, SpO2 96%.     General: awake, alert, appropriate, c/o 7/10 pain- hasn't had AM meds; sitting up slightly in bed; NAD HENT: conjugate gaze; oropharynx moist CV: regular rate and rhythm; no JVD Pulmonary: CTA B/L; no W/R/R- good air movement GI: soft, NT, ND, (+)BS- slightly protuberant Psychiatric: appropriate- interactive- brighter due to sleep  overnight Neurological: Ox3 MS: L AKA- well healed- has prosthesis in room Skin: healing/glued 3 incision on R interior groin/thigh and calf- from bypass surgery- looks good- no erythema, no drainage, mild edema  Assessment/Plan: 1. Functional deficits which require 3+ hours per day of interdisciplinary therapy in a comprehensive inpatient rehab setting. Physiatrist is providing close team supervision and 24 hour management of active medical problems listed below. Physiatrist and rehab team continue to assess barriers to discharge/monitor patient progress toward functional and medical goals  Care Tool:  Bathing    Body parts bathed by patient: Right arm, Left arm, Chest, Abdomen, Front perineal area, Right upper leg, Left upper leg   Body parts bathed by helper: Buttocks, Right lower leg Body parts n/a: Left lower leg (L AKA)   Bathing assist Assist Level: Minimal Assistance - Patient > 75%     Upper Body Dressing/Undressing Upper body dressing   What is the patient wearing?: Pull over shirt    Upper body assist Assist Level: Contact Guard/Touching assist  Lower Body Dressing/Undressing Lower body dressing            Lower body assist Assist for lower body dressing: Minimal Assistance - Patient > 75%     Toileting Toileting    Toileting assist Assist for toileting: Maximal Assistance - Patient 25 - 49%     Transfers Chair/bed transfer  Transfers assist     Chair/bed transfer assist level: Contact Guard/Touching assist     Locomotion Ambulation   Ambulation assist   Ambulation activity did not occur: Safety/medical concerns (fatigue, pain)          Walk 10 feet activity   Assist  Walk 10 feet activity did not occur: Safety/medical concerns        Walk 50 feet activity   Assist Walk 50 feet with 2 turns activity did not occur: Safety/medical concerns         Walk 150 feet activity   Assist Walk 150 feet activity did not occur:  Safety/medical concerns         Walk 10 feet on uneven surface  activity   Assist Walk 10 feet on uneven surfaces activity did not occur: Safety/medical concerns         Wheelchair     Assist Is the patient using a wheelchair?: Yes      Wheelchair assist level: Supervision/Verbal cueing Max wheelchair distance: 75 ft    Wheelchair 50 feet with 2 turns activity    Assist        Assist Level: Supervision/Verbal cueing   Wheelchair 150 feet activity     Assist      Assist Level: Maximal Assistance - Patient 25 - 49%   Blood pressure 126/66, pulse 65, temperature 98.2 F (36.8 C), temperature source Oral, resp. rate 18, height 5\' 7"  (1.702 m), weight 82.6 kg, SpO2 96%.   Medical Problem List and Plan: 1. Functional deficits secondary to debility related to PAD status post right common femoral to peroneal bypass for critical limb ischemia with tissue loss 08/09/2023 per Dr. Chestine Spore as well as history of left AKA 2022.             -patient may shower but incisions must be covered             -ELOS/Goals: modI 7-10 days             Admit to CIR  Con't CIR PT and OT  IPOC today 2.  Antithrombotics: -DVT/anticoagulation:  Pharmaceutical: Heparin 8/22- will change to Lovenox since has been 4+ days since surgery and Cr/BUN allow it- less injections             -antiplatelet therapy: Aspirin 81 mg daily and Plavix 75 mg daily 3. Pain Management: Neurontin 300 mg 3 times daily, baclofen 5 mg twice daily as needed, oxycodone as needed  8/22- pt reports bed really bothering back/buttocks- will get air bed  -pain reports just got pain meds-taking 2 tabs usually- and wil monitor if needs to increase since rating pain 6/10 this AM, before meds kicked in.   8/23- Pain 7/10 this Am, but hasn't gotten meds since ~ 1am-  4. Mood/Behavior/Sleep: Prozac 10 mg daily.  Provide emotional support             -antipsychotic agents: N/A 5. Neuropsych/cognition: This patient is  capable of making decisions on his own behalf. 6. Skin/Wound Care: Routine skin checks  8/22- R thigh/groin and Claf look good- L AKA healed-  7. Fluids/Electrolytes/Nutrition: Routine  in and outs with follow-up chemistries 8.  Acute on chronic anemia.  Follow-up CBC  8/22- Hb 9.4 stable to increasing- will monitor weekly.  9.  Diabetes mellitus with peripheral neuropathy.  Hemoglobin A1c 6.4.  Glucophage 1000 mg twice daily.  8/22- Cbgs running 104-117- doing great- will con't regimen  8/23- CBG's 99-135- con't regimen 10.  Hyperlipidemia.  Lipitor/Zetia 11.  Hypertension with hypotension.  Reduce Bystolic to 10mg  daily given hypotension. Monitor with increased mobility  8/22- BP's a little soft- in 110s-110s- will monitor with therapy if has orthostaitc hypotension  8/23- BP's running 87/52 up to 126/66- don't feel comofrtable stopping Bystolic- will add Midodrine 2.5 mg TID and monitor for orthostatic hypotension more. Will order Orthostatics daily x 5 days 12.  GERD.  Continue Protonix 13.  Obesity.  BMI 29.66.  Dietary follow-up  8/22- Down to 28.52 14.  Tobacco abuse.  Nicorette gum as needed.  Provide counseling 15.  Constipation.  Continue MiraLAX daily, Colace 100 mg daily  8/22- Pt reports LBM yesterday- denies constipation.   8/23- LBM yesterday- not too hard 16. Insomnia  8/22- will add Melatonin 6 mg at bedtime.   8/23- slept much better- esp with air bed.    I spent a total of 35    minutes on total care today- >50% coordination of care- due to   Doing IPOC as well as speaking with therapies about progress/eval so far.     LOS: 2 days A FACE TO FACE EVALUATION WAS PERFORMED  [name] 08/18/2023, 8:47 AM

## 2023-08-18 NOTE — Progress Notes (Signed)
0205 Patient states he was awaken from his sleep with aching, dull and throbbing  pain to right surgical leg, groin and radiating to his right foot. Medicated with prn, monitor and assess.[name]

## 2023-08-18 NOTE — Progress Notes (Signed)
Physical Therapy Session Note  Patient Details  Name: [name] MRN: [id number] Date of Birth: 03/28/1951  Today's Date: 08/18/2023 PT Individual Time: 1500-1530 and 810-900 PT Individual Time Calculation (min): 30 min and 50 min.  Short Term Goals: Week 1:  PT Short Term Goal 1 (Week 1): STG's=LTG's due to ELOS  Skilled Therapeutic Interventions/Progress Updates:   First session:  Pt presents sitting upright in bed eating breakfast.  Pt agreeable to transfer to w/c or recliner to finish breakfast.  Pt assisted w/ putting shorts over R foot and then pt completes donning by rolling side to side.  Slipper sock donned w/ total A, unable to flex R hip.  Pt transfers sup to sit w/ CGA only but use of siderails and elevated HOB.  Pt scoots to EOB.  Pt performs squat pivot transfer bed > recliner w/ CGA.  Pt set up w/ breakfast, PT to return.  Pt agreeable to therapy.  Pt performed scooting forward and back in recliner w/ supervision.  Pt performed AP, LAQ, hip flexion R LE 3 x 10-15, abd/add BLES.  Pt performed shoulder flex to 90 degrees (available ROM/strength).  Pt remained sitting in recliner w/ LES elevated and all needs in reach.  Missed time 10' eating.  Second session:  Pt presents sitting in w/c and agreeable to therapy in room.  Pt wishes to wheel into BR to check use of toilet.  Pt wheels self to doorway but requires removal of leg rest 2/2 sharp turn.  Pt able to complete turn and enter BR.  PT shows positioning needed for safe transfer w/c <> toilet.  Pt educated on hand positioning for transfer to commode over toilet.  Pt educated on need for 2nd person/staff to manage clothing and for safety.  Pt transfers w/ R hand on rail on wall and L hand on arm rest of w/c.  Drop arm dropped, although pt able to perform w/o removal.  Pt transferred toilet > w/c w/ same hand placement.  Pt wheeled to bed and performed squat pivot transfer w/c > bed w/ CGA.  Pt performed L hip flex and hyper extension  into bed, abd w/ red T-band and hip extension using red T-band.  Pt states increased pain at conclusion.  Pt remained supine in bed w/ bed alarm on and all needs in reach.     Therapy Documentation Precautions:  Precautions Precautions: Fall Precaution Comments: L AKA Restrictions Weight Bearing Restrictions: No General:   Vital Signs:   Pain: 7/10 R LE and B shoulders, w/ pain meds received.  PM- pt states 8/10 pain.      Therapy/Group: Individual Therapy  Lucio Edward 08/18/2023, 3:34 PM

## 2023-08-19 DIAGNOSIS — E1142 Type 2 diabetes mellitus with diabetic polyneuropathy: Secondary | ICD-10-CM | POA: Diagnosis not present

## 2023-08-19 DIAGNOSIS — I10 Essential (primary) hypertension: Secondary | ICD-10-CM

## 2023-08-19 DIAGNOSIS — Z794 Long term (current) use of insulin: Secondary | ICD-10-CM

## 2023-08-19 DIAGNOSIS — G479 Sleep disorder, unspecified: Secondary | ICD-10-CM | POA: Diagnosis not present

## 2023-08-19 DIAGNOSIS — I739 Peripheral vascular disease, unspecified: Secondary | ICD-10-CM | POA: Diagnosis not present

## 2023-08-19 LAB — GLUCOSE, CAPILLARY
Glucose-Capillary: 96 mg/dL (ref 70–99)
Glucose-Capillary: 119 mg/dL — ABNORMAL HIGH (ref 70–99)
Glucose-Capillary: 122 mg/dL — ABNORMAL HIGH (ref 70–99)
Glucose-Capillary: 111 mg/dL — ABNORMAL HIGH (ref 70–99)

## 2023-08-19 NOTE — Progress Notes (Signed)
PROGRESS NOTE   Subjective/Complaints:   Back side sore. He's sleeping on air mattress. Spoke to nurse, no changes with wound  ROS: Patient denies fever, rash, sore throat, blurred vision, dizziness, nausea, vomiting, diarrhea, cough, shortness of breath or chest pain,   headache, or mood change.     Objective:   No results found. Recent Labs    08/16/23 1844 08/17/23 0523  WBC 5.0 4.2  HGB 9.3* 9.4*  HCT 32.8* 33.5*  PLT 318 296   Recent Labs    08/16/23 1844 08/17/23 0523  NA  --  137  K  --  3.8  CL  --  98  CO2  --  29  GLUCOSE  --  98  BUN  --  12  CREATININE 0.84 0.80  CALCIUM  --  8.3*    Intake/Output Summary (Last 24 hours) at 08/19/2023 0841 Last data filed at 08/18/2023 2226 Gross per 24 hour  Intake 300 ml  Output 825 ml  Net -525 ml     Pressure Injury 08/16/23 Buttocks Left Stage 2 -  Partial thickness loss of dermis presenting as a shallow open injury with a red, pink wound bed without slough. Small stage 2 just inside left buttock (Active)  08/16/23 1720  Location: Buttocks  Location Orientation: Left  Staging: Stage 2 -  Partial thickness loss of dermis presenting as a shallow open injury with a red, pink wound bed without slough.  Wound Description (Comments): Small stage 2 just inside left buttock  Present on Admission: Yes    Physical Exam: Vital Signs Blood pressure 132/71, pulse 66, temperature 98.4 F (36.9 C), temperature source Oral, resp. rate 19, height 5\' 7"  (1.702 m), weight 82.6 kg, SpO2 95%.     Constitutional: No distress . Vital signs reviewed. HEENT: NCAT, EOMI, oral membranes moist Neck: supple Cardiovascular: RRR without murmur. No JVD    Respiratory/Chest: CTA Bilaterally without wheezes or rales. Normal effort    GI/Abdomen: BS +, non-tender, non-distended Ext: no clubbing, cyanosis, or edema Psych: pleasant and cooperative  Neurological: Ox3 MS: L AKA-  well healed- has prosthesis at his side Skin: healing/glued 3 incision on R interior groin/thigh and calf- from bypass surgery- unchanged. Buttock wound dressed  Assessment/Plan: 1. Functional deficits which require 3+ hours per day of interdisciplinary therapy in a comprehensive inpatient rehab setting. Physiatrist is providing close team supervision and 24 hour management of active medical problems listed below. Physiatrist and rehab team continue to assess barriers to discharge/monitor patient progress toward functional and medical goals  Care Tool:  Bathing    Body parts bathed by patient: Right arm, Left arm, Chest, Abdomen, Front perineal area, Right upper leg, Left upper leg   Body parts bathed by helper: Buttocks, Right lower leg Body parts n/a: Left lower leg (L AKA)   Bathing assist Assist Level: Minimal Assistance - Patient > 75%     Upper Body Dressing/Undressing Upper body dressing   What is the patient wearing?: Pull over shirt    Upper body assist Assist Level: Contact Guard/Touching assist    Lower Body Dressing/Undressing Lower body dressing  Lower body assist Assist for lower body dressing: Minimal Assistance - Patient > 75%     Toileting Toileting    Toileting assist Assist for toileting: Maximal Assistance - Patient 25 - 49%     Transfers Chair/bed transfer  Transfers assist     Chair/bed transfer assist level: Contact Guard/Touching assist     Locomotion Ambulation   Ambulation assist   Ambulation activity did not occur: Safety/medical concerns (fatigue, pain)          Walk 10 feet activity   Assist  Walk 10 feet activity did not occur: Safety/medical concerns        Walk 50 feet activity   Assist Walk 50 feet with 2 turns activity did not occur: Safety/medical concerns         Walk 150 feet activity   Assist Walk 150 feet activity did not occur: Safety/medical concerns         Walk 10 feet on  uneven surface  activity   Assist Walk 10 feet on uneven surfaces activity did not occur: Safety/medical concerns         Wheelchair     Assist Is the patient using a wheelchair?: Yes      Wheelchair assist level: Supervision/Verbal cueing Max wheelchair distance: 75 ft    Wheelchair 50 feet with 2 turns activity    Assist        Assist Level: Supervision/Verbal cueing   Wheelchair 150 feet activity     Assist      Assist Level: Maximal Assistance - Patient 25 - 49%   Blood pressure 132/71, pulse 66, temperature 98.4 F (36.9 C), temperature source Oral, resp. rate 19, height 5\' 7"  (1.702 m), weight 82.6 kg, SpO2 95%.   Medical Problem List and Plan: 1. Functional deficits secondary to debility related to PAD status post right common femoral to peroneal bypass for critical limb ischemia with tissue loss 08/09/2023 per Dr. Chestine Spore as well as history of left AKA 2022.             -patient may shower but incisions must be covered             -ELOS/Goals: modI 7-10 days             Admit to CIR  -Continue CIR therapies including PT and OT. Interdisciplinary team conference today to discuss goals, barriers to discharge, and dc planning.     2.  Antithrombotics: -DVT/anticoagulation:  Pharmaceutical: Heparin 8/22- will change to Lovenox since has been 4+ days since surgery and Cr/BUN allow it- less injections             -antiplatelet therapy: Aspirin 81 mg daily and Plavix 75 mg daily 3. Pain Management: Neurontin 300 mg 3 times daily, baclofen 5 mg twice daily as needed, oxycodone as needed  8/22- pt reports bed really bothering back/buttocks- will get air bed  -pain reports just got pain meds-taking 2 tabs usually- and wil monitor if needs to increase since rating pain 6/10 this AM, before meds kicked in.   8/24 pt reported to me that pain was adequately controlled-  4. Mood/Behavior/Sleep: Prozac 10 mg daily.  Provide emotional support              -antipsychotic agents: N/A 5. Neuropsych/cognition: This patient is capable of making decisions on his own behalf. 6. Skin/Wound Care: Routine skin checks  8/22- R thigh/groin and Claf look good- L AKA healed-   8/24 pressure relief, dressing to  buttock area 7. Fluids/Electrolytes/Nutrition: Routine in and outs with follow-up chemistries 8.  Acute on chronic anemia.  Follow-up CBC  8/22- Hb 9.4 stable to increasing- will monitor weekly.  9.  Diabetes mellitus with peripheral neuropathy.  Hemoglobin A1c 6.4.  Glucophage 1000 mg twice daily.  8/22- Cbgs running 104-117- doing great- will con't regimen  8/23- CBG's 99-135- con't regimen 10.  Hyperlipidemia.  Lipitor/Zetia 11.  Hypertension with hypotension.  Reduce Bystolic to 10mg  daily given hypotension. Monitor with increased mobility  8/22- BP's a little soft- in 110s-110s- will monitor with therapy if has orthostaitc hypotension  8/23- BP's running 87/52 up to 126/66- don't feel comofrtable stopping Bystolic- will add Midodrine 2.5 mg TID and monitor for orthostatic hypotension more. Will order Orthostatics daily x 5 days  8/24 no ortho vs yet this am. Bp more robust--observe today 12.  GERD.  Continue Protonix 13.  Obesity.  BMI 29.66.  Dietary follow-up  8/22- Down to 28.52 14.  Tobacco abuse.  Nicorette gum as needed.  Provide counseling 15.  Constipation.  Continue MiraLAX daily, Colace 100 mg daily  8/22- Pt reports LBM yesterday- denies constipation.   8/23- LBM yesterday- not too hard 16. Insomnia  8/22- will add Melatonin 6 mg at bedtime.   8/24 sleeping better esp with air bed.        LOS: 3 days A FACE TO FACE EVALUATION WAS PERFORMED  Ranelle Oyster 08/19/2023, 8:41 AM

## 2023-08-19 NOTE — Plan of Care (Signed)
  Problem: Consults Goal: RH GENERAL PATIENT EDUCATION Description: See Patient Education module for education specifics. Outcome: Progressing Goal: Skin Care Protocol Initiated - if Braden Score 18 or less Description: If consults are not indicated, leave blank or document N/A Outcome: Progressing   Problem: RH BOWEL ELIMINATION Goal: RH STG MANAGE BOWEL WITH ASSISTANCE Description: STG Manage Bowel with mod Assistance. Outcome: Progressing Goal: RH STG MANAGE BOWEL W/MEDICATION W/ASSISTANCE Description: STG Manage Bowel with Medication with min Assistance. Outcome: Progressing   Problem: RH BLADDER ELIMINATION Goal: RH STG MANAGE BLADDER WITH ASSISTANCE Description: STG Manage Bladder With min Assistance Outcome: Progressing   Problem: RH SKIN INTEGRITY Goal: RH STG SKIN FREE OF INFECTION/BREAKDOWN Description: Incision will continue to heal and be free of infection/breakdown with min assist.   Outcome: Progressing Goal: RH STG MAINTAIN SKIN INTEGRITY WITH ASSISTANCE Description: STG Maintain Skin Integrity With min Assistance. Outcome: Progressing Goal: RH STG ABLE TO PERFORM INCISION/WOUND CARE W/ASSISTANCE Description: STG Able To Perform Incision/Wound Care With mod I  Assistance. Outcome: Progressing   Problem: RH SAFETY Goal: RH STG ADHERE TO SAFETY PRECAUTIONS W/ASSISTANCE/DEVICE Description: STG Adhere to Safety Precautions With cueing Assistance/Device. Outcome: Progressing Goal: RH STG DECREASED RISK OF FALL WITH ASSISTANCE Description: STG Decreased Risk of Fall With min Assistance. Outcome: Progressing   Problem: RH PAIN MANAGEMENT Goal: RH STG PAIN MANAGED AT OR BELOW PT'S PAIN GOAL Description: Pain will be managed at 4 out of 10 on pain scale with PRN medications min assist  Outcome: Progressing   Problem: RH KNOWLEDGE DEFICIT GENERAL Goal: RH STG INCREASE KNOWLEDGE OF SELF CARE AFTER HOSPITALIZATION Description: Patient/caregiver will be able to manage  medications, incision/wound care, diet/lifestyle modifications to improve A1C of 6.4 and HDL cholesterol of 19 from nursing education and nursing handouts independently  Outcome: Progressing   Problem: Education: Goal: Understanding of CV disease, CV risk reduction, and recovery process will improve Outcome: Progressing Goal: Individualized Educational Video(s) Outcome: Progressing   Problem: Activity: Goal: Ability to return to baseline activity level will improve Outcome: Progressing   Problem: Cardiovascular: Goal: Ability to achieve and maintain adequate cardiovascular perfusion will improve Outcome: Progressing Goal: Vascular access site(s) Level 0-1 will be maintained Outcome: Progressing   Problem: Health Behavior/Discharge Planning: Goal: Ability to safely manage health-related needs after discharge will improve Outcome: Progressing   Problem: Education: Goal: Knowledge of prescribed regimen will improve Outcome: Progressing   Problem: Activity: Goal: Ability to tolerate increased activity will improve Outcome: Progressing   Problem: Bowel/Gastric: Goal: Gastrointestinal status for postoperative course will improve Outcome: Progressing   Problem: Clinical Measurements: Goal: Postoperative complications will be avoided or minimized Outcome: Progressing Goal: Signs and symptoms of graft occlusion will improve Outcome: Progressing   Problem: Skin Integrity: Goal: Demonstration of wound healing without infection will improve Outcome: Progressing

## 2023-08-19 NOTE — Plan of Care (Signed)
  Problem: RH SKIN INTEGRITY Goal: RH STG SKIN FREE OF INFECTION/BREAKDOWN Description: Incision will continue to heal and be free of infection/breakdown with min assist.   Outcome: Progressing Goal: RH STG MAINTAIN SKIN INTEGRITY WITH ASSISTANCE Description: STG Maintain Skin Integrity With min Assistance. Outcome: Progressing Goal: RH STG ABLE TO PERFORM INCISION/WOUND CARE W/ASSISTANCE Description: STG Able To Perform Incision/Wound Care With mod I  Assistance. Outcome: Progressing   Problem: RH SAFETY Goal: RH STG ADHERE TO SAFETY PRECAUTIONS W/ASSISTANCE/DEVICE Description: STG Adhere to Safety Precautions With cueing Assistance/Device. Outcome: Progressing Goal: RH STG DECREASED RISK OF FALL WITH ASSISTANCE Description: STG Decreased Risk of Fall With min Assistance. Outcome: Progressing   Problem: RH PAIN MANAGEMENT Goal: RH STG PAIN MANAGED AT OR BELOW PT'S PAIN GOAL Description: Pain will be managed at 4 out of 10 on pain scale with PRN medications min assist  Outcome: Progressing

## 2023-08-20 DIAGNOSIS — E1142 Type 2 diabetes mellitus with diabetic polyneuropathy: Secondary | ICD-10-CM | POA: Diagnosis not present

## 2023-08-20 DIAGNOSIS — I10 Essential (primary) hypertension: Secondary | ICD-10-CM | POA: Diagnosis not present

## 2023-08-20 DIAGNOSIS — I739 Peripheral vascular disease, unspecified: Secondary | ICD-10-CM | POA: Diagnosis not present

## 2023-08-20 DIAGNOSIS — G479 Sleep disorder, unspecified: Secondary | ICD-10-CM | POA: Diagnosis not present

## 2023-08-20 LAB — GLUCOSE, CAPILLARY
Glucose-Capillary: 108 mg/dL — ABNORMAL HIGH (ref 70–99)
Glucose-Capillary: 89 mg/dL (ref 70–99)
Glucose-Capillary: 123 mg/dL — ABNORMAL HIGH (ref 70–99)
Glucose-Capillary: 108 mg/dL — ABNORMAL HIGH (ref 70–99)

## 2023-08-20 NOTE — Progress Notes (Signed)
PROGRESS NOTE   Subjective/Complaints:   Pt up at bedside eating. No new complaints today. Back side still a little sore  ROS: Patient denies fever, rash, sore throat, blurred vision, dizziness, nausea, vomiting, diarrhea, cough, shortness of breath or chest pain, joint or back/neck pain, headache, or mood change.     Objective:   No results found. No results for input(s): "WBC", "HGB", "HCT", "PLT" in the last 72 hours.  No results for input(s): "NA", "K", "CL", "CO2", "GLUCOSE", "BUN", "CREATININE", "CALCIUM" in the last 72 hours.   Intake/Output Summary (Last 24 hours) at 08/20/2023 0756 Last data filed at 08/20/2023 0747 Gross per 24 hour  Intake 944 ml  Output 1200 ml  Net -256 ml     Pressure Injury 08/16/23 Buttocks Left Stage 2 -  Partial thickness loss of dermis presenting as a shallow open injury with a red, pink wound bed without slough. Small stage 2 just inside left buttock (Active)  08/16/23 1720  Location: Buttocks  Location Orientation: Left  Staging: Stage 2 -  Partial thickness loss of dermis presenting as a shallow open injury with a red, pink wound bed without slough.  Wound Description (Comments): Small stage 2 just inside left buttock  Present on Admission: Yes    Physical Exam: Vital Signs Blood pressure 110/84, pulse 69, temperature 98.4 F (36.9 C), temperature source Oral, resp. rate 18, height 5\' 7"  (1.702 m), weight 82.6 kg, SpO2 96%.     Constitutional: No distress . Vital signs reviewed. HEENT: NCAT, EOMI, oral membranes moist Neck: supple Cardiovascular: RRR without murmur. No JVD    Respiratory/Chest: CTA Bilaterally without wheezes or rales. Normal effort    GI/Abdomen: BS +, non-tender, non-distended Ext: no clubbing, cyanosis, or edema Psych: pleasant and cooperative  Neurological: Ox3 MS: L AKA- well healed- has prosthesis at his side Skin: healing/glued 3 incision on R  interior groin/thigh and calf- from bypass surgery- unchanged. Buttock wound with foam dressing  Assessment/Plan: 1. Functional deficits which require 3+ hours per day of interdisciplinary therapy in a comprehensive inpatient rehab setting. Physiatrist is providing close team supervision and 24 hour management of active medical problems listed below. Physiatrist and rehab team continue to assess barriers to discharge/monitor patient progress toward functional and medical goals  Care Tool:  Bathing    Body parts bathed by patient: Right arm, Left arm, Chest, Abdomen, Front perineal area, Right upper leg, Left upper leg   Body parts bathed by helper: Buttocks, Right lower leg Body parts n/a: Left lower leg (L AKA)   Bathing assist Assist Level: Minimal Assistance - Patient > 75%     Upper Body Dressing/Undressing Upper body dressing   What is the patient wearing?: Pull over shirt    Upper body assist Assist Level: Contact Guard/Touching assist    Lower Body Dressing/Undressing Lower body dressing            Lower body assist Assist for lower body dressing: Minimal Assistance - Patient > 75%     Toileting Toileting    Toileting assist Assist for toileting: Maximal Assistance - Patient 25 - 49%     Transfers Chair/bed transfer  Transfers assist  Chair/bed transfer assist level: Contact Guard/Touching assist     Locomotion Ambulation   Ambulation assist   Ambulation activity did not occur: Safety/medical concerns (fatigue, pain)          Walk 10 feet activity   Assist  Walk 10 feet activity did not occur: Safety/medical concerns        Walk 50 feet activity   Assist Walk 50 feet with 2 turns activity did not occur: Safety/medical concerns         Walk 150 feet activity   Assist Walk 150 feet activity did not occur: Safety/medical concerns         Walk 10 feet on uneven surface  activity   Assist Walk 10 feet on uneven surfaces  activity did not occur: Safety/medical concerns         Wheelchair     Assist Is the patient using a wheelchair?: Yes      Wheelchair assist level: Supervision/Verbal cueing Max wheelchair distance: 75 ft    Wheelchair 50 feet with 2 turns activity    Assist        Assist Level: Supervision/Verbal cueing   Wheelchair 150 feet activity     Assist      Assist Level: Maximal Assistance - Patient 25 - 49%   Blood pressure 110/84, pulse 69, temperature 98.4 F (36.9 C), temperature source Oral, resp. rate 18, height 5\' 7"  (1.702 m), weight 82.6 kg, SpO2 96%.   Medical Problem List and Plan: 1. Functional deficits secondary to debility related to PAD status post right common femoral to peroneal bypass for critical limb ischemia with tissue loss 08/09/2023 per Dr. Chestine Spore as well as history of left AKA 2022.             -patient may shower but incisions must be covered             -ELOS/Goals: modI 7-10 days             -Continue CIR therapies including PT, OT      2.  Antithrombotics: -DVT/anticoagulation:  Pharmaceutical: Heparin 8/22- will change to Lovenox since has been 4+ days since surgery and Cr/BUN allow it- less injections             -antiplatelet therapy: Aspirin 81 mg daily and Plavix 75 mg daily 3. Pain Management: Neurontin 300 mg 3 times daily, baclofen 5 mg twice daily as needed, oxycodone as needed  8/22- pt reports bed really bothering back/buttocks- will get air bed  -pain reports just got pain meds-taking 2 tabs usually- and wil monitor if needs to increase since rating pain 6/10 this AM, before meds kicked in.   8/24 pt reported to me that pain was adequately controlled-  4. Mood/Behavior/Sleep: Prozac 10 mg daily.  Provide emotional support             -antipsychotic agents: N/A 5. Neuropsych/cognition: This patient is capable of making decisions on his own behalf. 6. Skin/Wound Care: Routine skin checks  8/22- R thigh/groin and Claf look  good- L AKA healed-   8/24-25 pressure relief, dressing to buttock area 7. Fluids/Electrolytes/Nutrition: Routine in and outs with follow-up chemistries 8.  Acute on chronic anemia.  Follow-up CBC  8/22- Hb 9.4 stable to increasing- will monitor weekly.  9.  Diabetes mellitus with peripheral neuropathy.  Hemoglobin A1c 6.4.  Glucophage 1000 mg twice daily.  8/22- Cbgs running 104-117- doing great- will con't regimen  8/23- CBG's 99-135- con't regimen 10.  Hyperlipidemia.  Lipitor/Zetia 11.  Hypertension with hypotension.  Reduce Bystolic to 10mg  daily given hypotension. Monitor with increased mobility  8/22- BP's a little soft- in 110s-110s- will monitor with therapy if has orthostaitc hypotension  8/23- BP's running 87/52 up to 126/66- don't feel comofrtable stopping Bystolic- will add Midodrine 2.5 mg TID and monitor for orthostatic hypotension more. Will order Orthostatics daily x 5 days  8/25 I don't see any orthostatics. Will order 12.  GERD.  Continue Protonix 13.  Obesity.  BMI 29.66.  Dietary follow-up  8/22- Down to 28.52 14.  Tobacco abuse.  Nicorette gum as needed.  Provide counseling 15.  Constipation.  Continue MiraLAX daily, Colace 100 mg daily  8/22- Pt reports LBM yesterday- denies constipation.   LBM 8/22 16. Insomnia  8/22- will add Melatonin 6 mg at bedtime.   8/24 sleeping better esp with air bed.        LOS: 4 days A FACE TO FACE EVALUATION WAS PERFORMED  Ranelle Oyster 08/20/2023, 7:56 AM

## 2023-08-20 NOTE — Plan of Care (Signed)
  Problem: RH BOWEL ELIMINATION Goal: RH STG MANAGE BOWEL WITH ASSISTANCE Description: STG Manage Bowel with mod Assistance. Outcome: Progressing Goal: RH STG MANAGE BOWEL W/MEDICATION W/ASSISTANCE Description: STG Manage Bowel with Medication with min Assistance. Outcome: Progressing   Problem: RH BLADDER ELIMINATION Goal: RH STG MANAGE BLADDER WITH ASSISTANCE Description: STG Manage Bladder With min Assistance Outcome: Progressing   Problem: RH SKIN INTEGRITY Goal: RH STG SKIN FREE OF INFECTION/BREAKDOWN Description: Incision will continue to heal and be free of infection/breakdown with min assist.   Outcome: Progressing Goal: RH STG MAINTAIN SKIN INTEGRITY WITH ASSISTANCE Description: STG Maintain Skin Integrity With min Assistance. Outcome: Progressing Goal: RH STG ABLE TO PERFORM INCISION/WOUND CARE W/ASSISTANCE Description: STG Able To Perform Incision/Wound Care With mod I  Assistance. Outcome: Progressing   Problem: RH PAIN MANAGEMENT Goal: RH STG PAIN MANAGED AT OR BELOW PT'S PAIN GOAL Description: Pain will be managed at 4 out of 10 on pain scale with PRN medications min assist  Outcome: Progressing   Problem: RH KNOWLEDGE DEFICIT GENERAL Goal: RH STG INCREASE KNOWLEDGE OF SELF CARE AFTER HOSPITALIZATION Description: Patient/caregiver will be able to manage medications, incision/wound care, diet/lifestyle modifications to improve A1C of 6.4 and HDL cholesterol of 19 from nursing education and nursing handouts independently  Outcome: Progressing

## 2023-08-20 NOTE — Progress Notes (Signed)
Physical Therapy Session Note  Patient Details  Name: [name] MRN: [id number] Date of Birth: 1951-08-04  Today's Date: 08/20/2023 PT Individual Time: 0800-0910 PT Individual Time Calculation (min): 70 min   Short Term Goals: Week 1:  PT Short Term Goal 1 (Week 1): STG's=LTG's due to ELOS  Skilled Therapeutic Interventions/Progress Updates:      Therapy Documentation Precautions:  Precautions Precautions: Fall Precaution Comments: L AKA Restrictions Weight Bearing Restrictions: No  Pt received semi-reclined in bed, agreeable to PT. Pt with 8.5/10 bilateral shoulder and B LE pain, pre-medicated. PT provided repositioning, rest breaks, activity modification and heat for relief.   Pt (S) with supine to sit and squat pivot to w/c. Pt dependently transported for time management to main gym. PT applied heat to bilateral shoulders supine on mat with bolster positioned under right knee. Pt reported temporary relief, but requested to sit up due to L residual limb pain.   Pt utilized cane to perform active assist left shoulder scaption to address ROM and flexibility deficits 2 x 10. Pt unable to perform on right secondary to pain. Pt reported muscle tightness and pt transitioned to swiss ball walk outs for AROM shoulder in flexion and lats flexibility training x 10.   Pt transitioned to seated R LE therapeutic exercise and performed 2 x 12 of heel slides with pillowcase positioned under right foot to decrease friction.   PT performed passive gastroc/soleus stretching to address LE tightness to right side and pt reported improvement in symptoms. Pt attempted UE bike at level 2 x 45 seconds and regressed to level 1 x 30 s. Pt largely limited due to right UE pain and activity tolerance deficits.   Pt dependently transported to room and left semi-reclined in bed with all needs in reach and alarm on.    Therapy/Group: Individual Therapy  Truitt Leep Truitt Leep PT, DPT   08/20/2023, 7:44 AM

## 2023-08-20 NOTE — Progress Notes (Signed)
Occupational Therapy Session Note  Patient Details  Name: [name] MRN: [id number] Date of Birth: 10-01-1951  Today's Date: 08/20/2023 OT Individual Time: 2706-2376 & 1400-1445 OT Individual Time Calculation (min): 60 min & 45 min and Today's Date: 08/20/2023 OT Missed Time: 10 Minutes Missed Time Reason: Patient unwilling/refused to participate without medical reason   Short Term Goals: Week 1:  OT Short Term Goal 1 (Week 1): LTG=STG d/t ELOS  Skilled Therapeutic Interventions/Progress Updates:      Therapy Documentation Precautions:  Precautions Precautions: Fall Precaution Comments: L AKA Restrictions Weight Bearing Restrictions: No  Session 1 General: "This is a long time to see you." Pt supine in bed upon OT arrival, agreeable to OT session.  Pain:  7/10 pain reported in Rt leg, activity, intermittent rest breaks, distractions provided for pain management, pt reports tolerable to proceed.   ADL: -Bed mobility: SBA supine><EOB and scooting to EOB with use of bed rail Transfers: CGA squat pivot from bed><W/C, leaning forward and using W/C arm rest to pivot   Exercises: Pt completed the following exercise in order to improve functional activity, strength and endurance to prepare for ADLs such as bathing. Pt completed the following exercises in seated position with no noted LOB/SOB and 3x10 repetitions on each exercise: -towel slides anterior/posterior with 2 second hold at end range -towel slides Lt/Rt side to side with 2 second hold at end range -"wall washes" with BUE moving in circular motion on wall -isometric internal/external rotation    Other Treatments: Pt issued pink sponge d/t arthritis stiffness. Pt educated on various exercises to complete with sponge for decreased muscle stiffness in PIP and DIP joints. Pt completed 3x10 power grip squeezes with pink sponge on BUE. Pt offered theraputty in order to prevent muscle stiffness d/t arthritis. Pt declined.    Pt supine in bed with bed alarm activated heat packs on bilateral shoulders, 2 bed rails up, call light within reach and 4Ps assessed.  Session 2 General: "I need to use the bathroom." Pt supine in bed upon OT arrival, agreeable to OT session.  Pain:  7/10 pain reported in buttocks from using bathroom from BM, activity, intermittent rest breaks, distractions provided for pain management, pt reports tolerable to proceed.   ADL: Bed mobility: SBA supine><EOB Toilet transfer: SBA w/c><DABSC over toilet Toileting: SBA using grab bars for pants management and support during BM hygiene -mobility: Pt propelled w/c from room/therapy gym with BUE and no rest breaks at slow pace Exercises: Pt seated EOM completing 3x10 modified sit up with throwing 3.3 lb ball. Pt able to posterior lean 20* for modified sit up and throw ball to OT. Pt requiring PRN rest breaks during exercises. Pt completed activity to increase strength/endurance in order to complete functional transfers.  Other Treatments: Pt educated on correct positioning for preparing W/C for squat pivot transfers for increased safety.  Pt supine in bed with bed alarm activated, 2 bed rails up, call light within reach and 4Ps assessed.   Therapy/Group: Individual Therapy  Velia Meyer, OTD, OTR/L 08/20/2023, 4:09 PM

## 2023-08-21 DIAGNOSIS — I739 Peripheral vascular disease, unspecified: Secondary | ICD-10-CM | POA: Diagnosis not present

## 2023-08-21 LAB — GLUCOSE, CAPILLARY
Glucose-Capillary: 98 mg/dL (ref 70–99)
Glucose-Capillary: 105 mg/dL — ABNORMAL HIGH (ref 70–99)
Glucose-Capillary: 103 mg/dL — ABNORMAL HIGH (ref 70–99)
Glucose-Capillary: 107 mg/dL — ABNORMAL HIGH (ref 70–99)

## 2023-08-21 MED ORDER — MIDODRINE HCL 5 MG PO TABS
5.0000 mg | ORAL_TABLET | Freq: Three times a day (TID) | ORAL | Status: DC
  Administered 2023-08-21 – 2023-08-24 (×9): 5 mg via ORAL
  Filled 2023-08-21 (×9): qty 1

## 2023-08-21 NOTE — Progress Notes (Signed)
Occupational Therapy Session Note  Patient Details  Name: [name] MRN: [id number] Date of Birth: [birth year]  Today's Date: 08/21/2023 OT Individual Time: 1140-1205 OT Individual Time Calculation (min): 25 min    Short Term Goals: Week 1:  OT Short Term Goal 1 (Week 1): LTG=STG d/t ELOS  Skilled Therapeutic Interventions/Progress Updates:  Pt greeted supine in bed. Pt reports fatigue/pain but agreeable to therex from EOB. Pt completed supine>sit with increased time and effort with close CGA. Pt completed below therex with level 1 theraband:  X10 bicep curls X10 horizontal shoulder ABD/ADD X10 forward punches X10 shoulder flexion to ~ 50*  Discussed ADLs at home with pt reporting plan for showering via TTB, pt reports declining practicing showering with OT before DC. Education provided on energy conservation strategies for home during ADLs with handout provided. Ended session with pt seated EOB with all needs within reach.   Therapy Documentation Precautions:  Precautions Precautions: Fall Precaution Comments: L AKA Restrictions Weight Bearing Restrictions: No    Pain: unrated pain reported in RLE, rest breaks provided as needed, asked for pain meds during session     Therapy/Group: Individual Therapy  Pollyann Glen [facility] 08/21/2023, 12:15 PM

## 2023-08-21 NOTE — Discharge Summary (Signed)
Bypass Discharge Summary Patient ID: [name] [id number] 72 y.o. 03-03-1951  Admit date: 08/09/2023  Discharge date and time: 08/16/2023  4:29 PM   Admitting Physician: Cephus Shelling, MD   Discharge Physician: Cephus Shelling, MD   Admission Diagnoses: Critical limb ischemia of right lower extremity (HCC) [I70.221] PAD (peripheral artery disease) (HCC) [I73.9]  Discharge Diagnoses: Critical limb ischemia of right lower extremity (HCC) [I70.221] PAD (peripheral artery disease) (HCC) [I73.9]  Admission Condition: fair  Discharged Condition: good  Indication for Admission: 72 year old male with critical limb ischemia with tissue loss in the right lower extremity.  He has previously had a right femoral endarterectomy in the past.  He recently presented with worsening tissue loss in the right lower extremity.  He underwent stenting of his right common and external iliac artery to improve his inflow prior to surgery today.  He has severe infrainguinal disease with only peroneal runoff.  He presents today for right common femoral artery to peroneal artery bypass after risk benefits discussed.  An assistant was needed for vein harvest and also for sewing the anastomosis given the complexity of the case.   Hospital Course: The patient was admitted to the hospital on 08/09/2023 and underwent the following surgery: 1.  Redo exposure of right common femoral artery greater than 30 days 2.  Harvest of right lower extremity great saphenous vein 3.  Right common femoral artery to peroneal artery bypass with ipsilateral reversed great saphenous vein  He tolerated the procedure well and was transferred to the PACU in stable condition. He did have some soft blood pressures intra-op so he was kept in the ICU for the first night postop. He did not require medication to increase his blood pressure post op and his MAP was >65.   POD 1- hemoglobin at 7.8 due to acute surgical blood loss anemia.  Given his soft pressures, he was given 1 unit PRBCs. He had good urine output.  POD2- hemoglobin improved and stable at 9.6. Still good urine output but bump in Scr at 1.42 due to AKI. Was kept on LR at 50cc/hr. His ACE inhibitor was held. Transferred to a stepdown unit.  POD3- Had one episode of atrial fibrillation on the monitor. Formal EKG demonstrated NSR with premature supraventricular complexes. LR at 50cc/hr was continued and Scr improved to 0.77. Hemoglobin was stable at 9.0  POD4- No more episodes of atrial fibrillation on the monitor. Still with good urine output. Mobilizing well with PT/OT and they recommended inpatient rehab.  POD5- Right lower extremity incisions were well appearing. There was a palpable pulse in the bypass graft. He had a brisk right peroneal doppler signal. His toe ulcers were dry and demarcating. His fluids and foley were discontinued. Repeat formal EKG demonstrated NSR with supraventricular premature beats again.  POD6 and POD7- Right lower extremity bypass was still patent without issue. Mobilizing well with PT/OT. Maintained on aspirin, plavix, and statin. He was discharged to CIR on POD 7.    Consults: rehabilitation medicine  Treatments: IV hydration, antibiotics: Ancef, analgesia: Dilaudid and Percocet, anticoagulation: ASA, Plavix, and subcutaneous heparin, therapies: PT, OT, and RN, and surgery: 1.  Redo exposure of right common femoral artery greater than 30 days 2.  Harvest of right lower extremity great saphenous vein 3.  Right common femoral artery to peroneal artery bypass with ipsilateral reversed great saphenous vein    Disposition: Discharge disposition: 02-Transferred to [facility]       - For [facility]  use ---  Post-op:  Wound infection: No  Graft infection: No  Transfusion: Yes  If yes, 1 units given New Arrhythmia: Yes, NSR with supraventricular premature beats Patency judged by: [x ] Dopper only, [x ] Palpable graft  pulse, [ ]  Palpable distal pulse, [ ]  ABI inc. > 0.15, [ ]  Duplex D/C Ambulatory Status: Ambulatory with Assistance  Complications: MI: [x ] No, [ ]  Troponin only, [ ]  EKG or Clinical CHF: No Resp failure: [x ] none, [ ]  Pneumonia, [ ]  Ventilator Chg in renal function: [x ] none, [ ]  Inc. Cr > 0.5, [ ]  Temp. Dialysis, [ ]  Permanent dialysis Stroke: [x ] None, [ ]  Minor, [ ]  Major Return to OR: No  Reason for return to OR: [ ]  Bleeding, [ ]  Infection, [ ]  Thrombosis, [ ]  Revision  Discharge medications: Statin use:  Yes ASA use:  Yes Plavix use:  Yes Beta blocker use: No  for medical reason not indicated Coumadin use: No  for medical reason not indicated    Patient Instructions:  Allergies as of 08/16/2023       Reactions   Glipizide Other (See Comments)   REACTION IS SIDE EFFECT Severe hypoglycemia to 40s.         Medication List     TAKE these medications    albuterol 108 (90 Base) MCG/ACT inhaler Commonly known as: VENTOLIN HFA TAKE 2 PUFFS BY MOUTH EVERY 6 HOURS AS NEEDED FOR WHEEZE OR SHORTNESS OF BREATH   amLODipine 5 MG tablet Commonly known as: NORVASC Take 1 tablet (5 mg total) by mouth at bedtime.   aspirin EC 81 MG tablet Take 1 tablet (81 mg total) by mouth daily. Swallow whole.   atorvastatin 40 MG tablet Commonly known as: LIPITOR TAKE 1 TABLET BY MOUTH EVERY DAY   baclofen 10 MG tablet Commonly known as: LIORESAL TAKE 0.5 TABS BY MOUTH 2 TIMES DAILY AS NEEDED FOR MUSCLE SPASMS. MAKE APPT BEFORE NEXT REFILL. What changed: See the new instructions.   benazepril 5 MG tablet Commonly known as: LOTENSIN TAKE 1 TABLET BY MOUTH EVERY DAY   clopidogrel 75 MG tablet Commonly known as: PLAVIX TAKE 1 TABLET BY MOUTH EVERY DAY   ezetimibe 10 MG tablet Commonly known as: ZETIA TAKE 1 TABLET BY MOUTH EVERY DAY   FLUoxetine 10 MG capsule Commonly known as: PROZAC TAKE 1 CAPSULE BY MOUTH EVERY DAY   gabapentin 300 MG capsule Commonly known as:  NEURONTIN TAKE 1 CAPSULE BY MOUTH THREE TIMES A DAY AS NEEDED What changed: Another medication with the same name was changed. Make sure you understand how and when to take each.   gabapentin 100 MG capsule Commonly known as: NEURONTIN Take 1 capsule (100 mg total) by mouth 3 (three) times daily. What changed: when to take this   metFORMIN 1000 MG tablet Commonly known as: GLUCOPHAGE TAKE 1 TABLET BY MOUTH TWICE A DAY WITH FOOD   Nebivolol HCl 20 MG Tabs TAKE 1 TABLET BY MOUTH EVERY DAY   nicotine polacrilex 2 MG gum Commonly known as: NICORETTE Take 1 each (2 mg total) by mouth as needed for smoking cessation.   oxyCODONE-acetaminophen 5-325 MG tablet Commonly known as: Percocet Take 1 tablet by mouth every 4 (four) hours as needed for severe pain.   pantoprazole 40 MG tablet Commonly known as: PROTONIX TAKE 1 TABLET BY MOUTH EVERY DAY AS NEEDED   polyethylene glycol powder 17 GM/SCOOP powder Commonly known as: GLYCOLAX/MIRALAX Take 17 g  by mouth daily.               Discharge Care Instructions  (From admission, onward)           Start     Ordered   08/16/23 0000  Discharge wound care:       Comments: Keep incisions clean and dry   08/16/23 1448           Activity: activity as tolerated, no driving while on analgesics, and no heavy lifting for 4 weeks Diet: low fat, low cholesterol diet Wound Care: keep wound clean and dry  Follow-up with [facility] in 3 weeks for incision check  Signed: [name], PA-C 08/21/2023 8:15 AM

## 2023-08-21 NOTE — Progress Notes (Signed)
Physical Therapy Session Note  Patient Details  Name: [name] MRN: [id number] Date of Birth: 1950-12-29  Today's Date: 08/21/2023 PT Individual Time: 0800-0900 PT Individual Time Calculation (min): 60 min  and Today's Date: 08/21/2023 PT Missed Time: 15 Minutes Missed Time Reason: Patient ill (Comment);Other (Comment) (dizzy, low BP)  Short Term Goals: Week 1:  PT Short Term Goal 1 (Week 1): STG's=LTG's due to ELOS  Skilled Therapeutic Interventions/Progress Updates:      Therapy Documentation Precautions:  Precautions Precautions: Fall Precaution Comments: L AKA Restrictions Weight Bearing Restrictions: No  Pt received semi-reclined in bed, agreeable to PT session with emphasis on car transfer training and UE/LE mobility and strength training. Pt with 7/10 bilateral shoulder and R LE pain, pre-medicated and provided rest/repositioning for relief.   Pt (S) with squat pivot to w/c and dependently transported by w/c for energy conservation to ortho gym. Pt engaged in blocked practice of squat pivot car transfer training x 5. Initially pt required min A fading to CGA with increased repetition. PT demonstrated appropriate technique and pt able to replicate. Pt reports he plans to use public transport for outings and doctor's appointments as his wife is unable to place w/c in and out of car.   Pt utilized portable UE arm bike 2 x 3 min to improve shoulder pain and ROM. Pt without reports of pain in activity today. Pt reports dizziness and vitals assessed seated in w/c:   Pulse: 60   O2: 98%   BP: 86/69 (77)   Pt returned to bed and vitals re-assessed semi-reclined:   Pulse: 59  O2: : 99   BP: 116/66 (83)  Pt left semi-reclined in bed with all needs in reach.   Therapy/Group: Individual Therapy  Truitt Leep Truitt Leep PT, DPT  08/21/2023, 8:35 AM

## 2023-08-21 NOTE — Progress Notes (Signed)
Occupational Therapy Session Note  Patient Details  Name: [name] MRN: [id number] Date of Birth: May 19, 1951  Today's Date: 08/21/2023 OT Individual Time: 1000-1100 & 1300-1403 OT Individual Time Calculation (min): 60 min & 63 min   Short Term Goals: Week 1:  OT Short Term Goal 1 (Week 1): LTG=STG d/t ELOS  Skilled Therapeutic Interventions/Progress Updates:      Therapy Documentation Precautions:  Precautions Precautions: Fall Precaution Comments: L AKA Restrictions Weight Bearing Restrictions: No  Session 1 General: "You made me get out of bed for no reason." Pt supine in bed upon OT arrival, agreeable to OT session with max encouragement. vitals taken in supine before activity: 61 bpm, 100% SpO2, 113/59 BP. Pt reports no dizziness during session.   Pain: 7/10 reported in bilateral shoulders and 8/1 reported in leg, received pain meds this AM, activity, intermittent rest breaks, distractions provided for pain management, pt reports tolerable to proceed.   ADL: Bed mobility: SBA supine><EOB utilizing bed features   Exercises: Pt issued yellow UE theraband HEP in order to increase functional strength, andurance and activity tolerance in order to increase independence in ADLs such as bathing. Pt issued yellow theraband and discussed direction/technique of exercises, demonstrating verbal understanding. Pt completed 3x10 exercises at EOB level listed below: -elbow extensions - shoulder horizontal abduction -bicep curls  -shoulder flexion -external rotation  Pt issued seated BUE kinesthetic/stretching in order to increase functional strength, endurance and activity tolerance in order to increase independence in ADLs such as bathing. Pt and OT discussed direction/technique of exercises, demonstrating verbal understanding. Pt completed 3x10 exercises at EOB level listed below:  Exercises - Seated Shoulder Shrugs  - 1 x daily - 7 x weekly - 3 sets - 10 reps - Seated Boxing  Jabs  - 1 x daily - 7 x weekly - 3 sets - 10 reps - Seated Scapular Protraction and Retraction  - 1 x daily - 7 x weekly - 3 sets - 10 reps   Other Treatments: OT educated pt on importance of participating in therapy in order to increase functional strength/endurance to maximize independence with ADLs.  Pt supine in bed with bed alarm activated, 2 bed rails up, call light within reach and 4Ps assessed.  Session 2 General: "I am ready to get out of here." Pt supine in bed upon OT arrival, agreeable to OT session. Session held outside for increased QoL and increase spirits.   Pain:  6/10 pain reported in RLE, activity, intermittent rest breaks, distractions provided for pain management, pt reports tolerable to proceed.   ADL: Transfers: SBA squat pivot from bed><W/C  Exercises:  -Pt completing ball bounding/throwing activity in order to increase trunk strength/support for increased independence with functional transfers. Pt completed multiple trials seated in W/C with no LOB. Pt able to reach to Lt and Rt without LOB -Pt completed 3x20 power grip sponge squeezes, thumb flexion/opposition, with blue hand sponge in order to decrease pain and stiffness d/t arthritis.   Other Treatments: Pt and OT discussed bathing in preparation for D/C. Pt discusses he feels as if sponge bathing is appropriate for D/C d/t needing to cover incisions for bathing. OT provided options for covering incisions if pt wanted to bathe in shower once home.  Pt supine in bed with bed alarm activated, 2 bed rails up, call light within reach and 4Ps assessed.   Therapy/Group: Individual Therapy  Velia Meyer, OTD, OTR/L 08/21/2023, 4:07 PM

## 2023-08-21 NOTE — Progress Notes (Addendum)
PROGRESS NOTE   Subjective/Complaints:  Pt reports pain 8/10 since stood for orthostatic testing this AM.   Took pain meds 1 hour ago.   Slept "fair" Also having muscle spasms of legs this AM.  LBM yesterday.     ROS:  Pt denies SOB, abd pain, CP, N/V/C/D, and vision changes   Objective:   No results found. No results for input(s): "WBC", "HGB", "HCT", "PLT" in the last 72 hours.  No results for input(s): "NA", "K", "CL", "CO2", "GLUCOSE", "BUN", "CREATININE", "CALCIUM" in the last 72 hours.   Intake/Output Summary (Last 24 hours) at 08/21/2023 0809 Last data filed at 08/21/2023 0601 Gross per 24 hour  Intake 480 ml  Output 475 ml  Net 5 ml     Pressure Injury 08/16/23 Buttocks Left Stage 2 -  Partial thickness loss of dermis presenting as a shallow open injury with a red, pink wound bed without slough. Small stage 2 just inside left buttock (Active)  08/16/23 1720  Location: Buttocks  Location Orientation: Left  Staging: Stage 2 -  Partial thickness loss of dermis presenting as a shallow open injury with a red, pink wound bed without slough.  Wound Description (Comments): Small stage 2 just inside left buttock  Present on Admission: Yes    Physical Exam: Vital Signs Blood pressure 130/63, pulse (!) 59, temperature 98.1 F (36.7 C), temperature source Oral, resp. rate 18, height 5\' 7"  (1.702 m), weight 82.6 kg, SpO2 98%.      General: awake, alert, appropriate, sitting up in bed; NAD HENT: conjugate gaze; oropharynx moist CV: regular rhythm; borderline bradycardic; no JVD Pulmonary: CTA B/L; no W/R/R- good air movement GI: soft, NT, ND, (+)BS- hypoactive Psychiatric: appropriate- interactive Neurological: Ox3 Saw pt jerking from painful muscle spasms this AM Ext: no clubbing, cyanosis, or edema Psych: pleasant and cooperative  Neurological: Ox3 MS: L AKA- well healed- has prosthesis at his  side Skin: healing/glued 3 incision on R interior groin/thigh and calf- from bypass surgery- unchanged. Buttock wound with foam dressing- look good today  Assessment/Plan: 1. Functional deficits which require 3+ hours per day of interdisciplinary therapy in a comprehensive inpatient rehab setting. Physiatrist is providing close team supervision and 24 hour management of active medical problems listed below. Physiatrist and rehab team continue to assess barriers to discharge/monitor patient progress toward functional and medical goals  Care Tool:  Bathing    Body parts bathed by patient: Right arm, Left arm, Chest, Abdomen, Front perineal area, Right upper leg, Left upper leg   Body parts bathed by helper: Buttocks, Right lower leg Body parts n/a: Left lower leg (L AKA)   Bathing assist Assist Level: Minimal Assistance - Patient > 75%     Upper Body Dressing/Undressing Upper body dressing   What is the patient wearing?: Pull over shirt    Upper body assist Assist Level: Contact Guard/Touching assist    Lower Body Dressing/Undressing Lower body dressing            Lower body assist Assist for lower body dressing: Minimal Assistance - Patient > 75%     Toileting Toileting    Toileting assist Assist for toileting: Maximal Assistance -  Patient 25 - 49%     Transfers Chair/bed transfer  Transfers assist     Chair/bed transfer assist level: Supervision/Verbal cueing     Locomotion Ambulation   Ambulation assist   Ambulation activity did not occur: Safety/medical concerns (fatigue, pain)          Walk 10 feet activity   Assist  Walk 10 feet activity did not occur: Safety/medical concerns        Walk 50 feet activity   Assist Walk 50 feet with 2 turns activity did not occur: Safety/medical concerns         Walk 150 feet activity   Assist Walk 150 feet activity did not occur: Safety/medical concerns         Walk 10 feet on uneven surface   activity   Assist Walk 10 feet on uneven surfaces activity did not occur: Safety/medical concerns         Wheelchair     Assist Is the patient using a wheelchair?: Yes      Wheelchair assist level: Supervision/Verbal cueing Max wheelchair distance: 75 ft    Wheelchair 50 feet with 2 turns activity    Assist        Assist Level: Supervision/Verbal cueing   Wheelchair 150 feet activity     Assist      Assist Level: Maximal Assistance - Patient 25 - 49%   Blood pressure 130/63, pulse (!) 59, temperature 98.1 F (36.7 C), temperature source Oral, resp. rate 18, height 5\' 7"  (1.702 m), weight 82.6 kg, SpO2 98%.   Medical Problem List and Plan: 1. Functional deficits secondary to debility related to PAD status post right common femoral to peroneal bypass for critical limb ischemia with tissue loss 08/09/2023 per Dr. Chestine Spore as well as history of left AKA 2022.             -patient may shower but incisions must be covered             -ELOS/Goals: modI 7-10 days           Con't CIR PT and OT- will d/c IV since not receiving any meds requiring it.   2.  Antithrombotics: -DVT/anticoagulation:  Pharmaceutical: Heparin 8/22- will change to Lovenox since has been 4+ days since surgery and Cr/BUN allow it- less injections             -antiplatelet therapy: Aspirin 81 mg daily and Plavix 75 mg daily 3. Pain Management: Neurontin 300 mg 3 times daily, baclofen 5 mg twice daily as needed, oxycodone as needed  8/22- pt reports bed really bothering back/buttocks- will get air bed  -pain reports just got pain meds-taking 2 tabs usually- and wil monitor if needs to increase since rating pain 6/10 this AM, before meds kicked in.   8/24 pt reported to me that pain was adequately controlled-   8/25- pt reports pain 8/10 this AM, from standing- doesn't want to change meds.  4. Mood/Behavior/Sleep: Prozac 10 mg daily.  Provide emotional support             -antipsychotic agents:  N/A 5. Neuropsych/cognition: This patient is capable of making decisions on his own behalf. 6. Skin/Wound Care: Routine skin checks  8/22- R thigh/groin and Claf look good- L AKA healed-   8/24-25 pressure relief, dressing to buttock area 7. Fluids/Electrolytes/Nutrition: Routine in and outs with follow-up chemistries 8.  Acute on chronic anemia.  Follow-up CBC  8/22- Hb 9.4 stable to increasing- will  monitor weekly.  9.  Diabetes mellitus with peripheral neuropathy.  Hemoglobin A1c 6.4.  Glucophage 1000 mg twice daily.  8/22- Cbgs running 104-117- doing great- will con't regimen  8/23- CBG's 99-135- con't regimen  8/25- CBG's running 89-123- con't regimen 10.  Hyperlipidemia.  Lipitor/Zetia 11.  Hypertension with hypotension.  Reduce Bystolic to 10mg  daily given hypotension. Monitor with increased mobility  8/22- BP's a little soft- in 110s-110s- will monitor with therapy if has orthostaitc hypotension  8/23- BP's running 87/52 up to 126/66- don't feel comofrtable stopping Bystolic- will add Midodrine 2.5 mg TID and monitor for orthostatic hypotension more. Will order Orthostatics daily x 5 days  8/24 I don't see any orthostatics. Will order  8/25- Orthostatics done- look good this AM- con't regimen 12.  GERD.  Continue Protonix 13.  Obesity.  BMI 29.66.  Dietary follow-up  8/22- Down to 28.52 14.  Tobacco abuse.  Nicorette gum as needed.  Provide counseling 15.  Constipation.  Continue MiraLAX daily, Colace 100 mg daily  8/22- Pt reports LBM yesterday- denies constipation.   LBM 8/22  8/25- LBM yesterday 16. Insomnia  8/22- will add Melatonin 6 mg at bedtime.   8/24 sleeping better esp with air bed.    I spent a total of 36   minutes on total care today- >50% coordination of care- due to  D/w pt about sleep, pain, and d/w nursing x2 about Orthostatics as well as IV and muscle spasms.    Addendum- BP dropped again with therapy- will increase midodrine to 5 mg TID   LOS: 5  days A FACE TO FACE EVALUATION WAS PERFORMED  [name] 08/21/2023, 8:09 AM

## 2023-08-22 DIAGNOSIS — I739 Peripheral vascular disease, unspecified: Secondary | ICD-10-CM | POA: Diagnosis not present

## 2023-08-22 LAB — GLUCOSE, CAPILLARY
Glucose-Capillary: 104 mg/dL — ABNORMAL HIGH (ref 70–99)
Glucose-Capillary: 110 mg/dL — ABNORMAL HIGH (ref 70–99)
Glucose-Capillary: 100 mg/dL — ABNORMAL HIGH (ref 70–99)
Glucose-Capillary: 104 mg/dL — ABNORMAL HIGH (ref 70–99)

## 2023-08-22 MED ORDER — OXYCODONE HCL 5 MG PO TABS
10.0000 mg | ORAL_TABLET | ORAL | Status: DC | PRN
  Administered 2023-08-22 – 2023-08-23 (×4): 15 mg via ORAL
  Filled 2023-08-22 (×4): qty 3

## 2023-08-22 MED ORDER — VITAMIN C 500 MG PO TABS
500.0000 mg | ORAL_TABLET | Freq: Two times a day (BID) | ORAL | Status: DC
  Administered 2023-08-22 – 2023-08-24 (×5): 500 mg via ORAL
  Filled 2023-08-22 (×5): qty 1

## 2023-08-22 MED ORDER — ZINC SULFATE 220 (50 ZN) MG PO CAPS
220.0000 mg | ORAL_CAPSULE | Freq: Every day | ORAL | Status: DC
  Administered 2023-08-22 – 2023-08-24 (×3): 220 mg via ORAL
  Filled 2023-08-22 (×3): qty 1

## 2023-08-22 MED ORDER — ENSURE MAX PROTEIN PO LIQD
11.0000 [oz_av] | Freq: Every day | ORAL | Status: DC
  Administered 2023-08-22 – 2023-08-24 (×3): 11 [oz_av] via ORAL

## 2023-08-22 MED ORDER — POVIDONE-IODINE 5 % EX SOLN
Freq: Every day | CUTANEOUS | Status: DC
  Filled 2023-08-22: qty 88.7

## 2023-08-22 NOTE — Progress Notes (Signed)
Occupational Therapy Note  Patient Details  Name: [name] MRN: [id number] Date of Birth: 1951-12-10  Today's Date: 08/22/2023 OT Missed Time: 45 Minutes Missed Time Reason: Patient unwilling/refused to participate without medical reason  Pt greeted asleep in supine, pt declining OT session stating, " I did everything I needed to do this morning." Offered bed level therapy, ADLs etc with pt declining but asking for pain meds, alerted nurse. Pt missed 45 mins of session, will f/u as time allows to make up for missed minutes.    Pollyann Glen [facility] 08/22/2023, 12:11 PM

## 2023-08-22 NOTE — Progress Notes (Signed)
Occupational Therapy Session Note  Patient Details  Name: [name] MRN: [id number] Date of Birth: August 13, 1951  Today's Date: 08/22/2023 OT Individual Time: 1415-1445 OT Individual Time Calculation (min): 30 min    Short Term Goals: Week 1:  OT Short Term Goal 1 (Week 1): LTG=STG d/t ELOS  Skilled Therapeutic Interventions/Progress Updates:      Therapy Documentation Precautions:  Precautions Precautions: Fall Precaution Comments: L AKA Restrictions Weight Bearing Restrictions: No General: "I want to wash up" Pt seated in W/C upon OT arrival, agreeable to OT.  Pain:  6.5/10 pain reported in buttocks, activity, intermittent rest breaks, distractions provided for pain management, pt reports tolerable to proceed.   ADL: Bed mobility: SBA EOB>supine Grooming: SBA seated in W/C at sink UB dressing: SBA seated in W/C for doffing/donning overhead shirt LB dressing: SBA for doffing/donning shorts by lateral leaning to manage over hips doffing/donning  Bathing: SBA sponge bathing at sink, able to wash/rinse/dry all appropriate body parts  Transfers: SBA squat pivot transfers W/C>bed  Pt supine in bed with bed alarm activated, 2 bed rails up, call light within reach and 4Ps assessed.   Therapy/Group: Individual Therapy  Velia Meyer, OTD, OTR/L 08/22/2023, 3:17 PM

## 2023-08-22 NOTE — Progress Notes (Signed)
Patient ID: [name], male   DOB: [birth year], 72 y.o.   MRN: [id number]  [facility] is following pt and will provide home health services at discharge. Will update once have discharge date.

## 2023-08-22 NOTE — Plan of Care (Signed)
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       Interventions:   Writer Max Zinc daily Vitamin C  Attendees/Contributors:   Vedia Pereyra, RN

## 2023-08-22 NOTE — Progress Notes (Signed)
Physical Therapy Note  Patient Details  Name: [name] MRN: [id number] Date of Birth: 12-18-1951 Today's Date: 08/22/2023    Pt attempted to be seen for scheduled therapy session, but reports he does not feel well, indicating pain when up with previous sessions and extreme fatigue. Provided education on importance of therapy but pt continues to refuse. Pt reports no urgent needs, just requests to rest. Pt remained in bed with needs in reach, missed 45 min d/t fatigue/refusal.    Juluis Rainier 08/22/2023, 12:38 PM

## 2023-08-22 NOTE — Progress Notes (Signed)
Occupational Therapy Session Note  Patient Details  Name: [name] MRN: [id number] Date of Birth: Aug 11, 1951  Today's Date: 08/22/2023 OT Individual Time: 4098-1191 OT Individual Time Calculation (min): 26 min    Short Term Goals: Week 1:  OT Short Term Goal 1 (Week 1): LTG=STG d/t ELOS  Skilled Therapeutic Interventions/Progress Updates:  Pt greeted supine in bed with pt agreeable to OT intervention. Pt completed supine>sit with CGA. Squat pivot to R side with CGA. Total A transport to gym d/t bilateral shoulder pain. Pt completed additional squat pivot to EOM to R side with CGA. Remainder of session focused on various therapeutic activities focused on core strength/endurance. Pt completed x10 modified crunches onto therapy ball with an emphasis on core stabilization for higher level functional mobility. Attempted to have pt toss ball to trampoline in conjunction with modified crunches to increase challenge  however pt politely declined as pt reports " I probably wont be able to catch it." Instead, instructed pt to hold ball at shoulder height while completing x10 modified crunches on therapy ball. Pt completed task with CGA.   Pt completed additional squat pivot back to w/c with CGA. Pt transported back to room with total A d/t fatigue. Ended session with pt seated in w/c with alarm belt activated and all needs within reach.   Therapy Documentation Precautions:  Precautions Precautions: Fall Precaution Comments: L AKA Restrictions Weight Bearing Restrictions: No  Pain: unrated pain reported in RLE, rest breaks, and repositioning provided as needed.     Therapy/Group: Individual Therapy  Barron Schmid 08/22/2023, 2:25 PM

## 2023-08-22 NOTE — Discharge Summary (Signed)
Physician Discharge Summary  Patient ID: [name] MRN: [id number] DOB/AGE: 08/24/1951 72 y.o.  Admit date: 08/16/2023 Discharge date: 08/24/2023  Discharge Diagnoses:  Principal Problem:   PAD (peripheral artery disease) (HCC) DVT prophylaxis Acute on chronic anemia Diabetes mellitus with peripheral neuropathy Hyperlipidemia Hypertension Obesity GERD Tobacco use History of left AKA  Discharged Condition: Stable  Significant Diagnostic Studies: PERIPHERAL VASCULAR CATHETERIZATION  Result Date: 08/09/2023 See surgical note for result.  VAS [place] LOWER EXTREMITY SAPHENOUS VEIN MAPPING  Result Date: 07/27/2023 LOWER EXTREMITY VEIN MAPPING Patient Name:  [name]  Date of Exam:   07/27/2023 Medical Rec #: [id number]       Accession #:    [id number] Date of Birth: Apr 27, 1951       Patient Gender: M Patient Age:   38 years Exam Location:  [facility] Procedure:      VAS [place] LOWER EXTREMITY SAPHENOUS VEIN MAPPING Referring Phys: Sherald Hess --------------------------------------------------------------------------------  Indications: Pre-op History:     History of PAD; patient is pre-operative for lower extremity bypass              graft.  Limitations: Bandage Comparison Study: No previous study. Performing Technologist: McKayla Maag RVT, [place]  Examination Guidelines: A complete evaluation includes B-mode imaging, spectral Doppler, color Doppler, and power Doppler as needed of all accessible portions of each vessel. Bilateral testing is considered an integral part of a complete examination. Limited examinations for reoccurring indications may be performed as noted. +---------------+-----------+----------------------+---------------+-----------+   RT Diameter  RT Findings         GSV            LT Diameter  LT Findings      (cm)                                            (cm)                   +---------------+-----------+----------------------+---------------+-----------+                  Bandage      Saphenofemoral                                                                Junction                                  +---------------+-----------+----------------------+---------------+-----------+      0.38                     Proximal thigh                               +---------------+-----------+----------------------+---------------+-----------+      0.35       branching       Mid thigh                                  +---------------+-----------+----------------------+---------------+-----------+      0.32  Distal thigh                                +---------------+-----------+----------------------+---------------+-----------+      0.41                          Knee                                    +---------------+-----------+----------------------+---------------+-----------+      0.35       branching       Prox calf                                  +---------------+-----------+----------------------+---------------+-----------+      0.15       branching        Mid calf                                  +---------------+-----------+----------------------+---------------+-----------+      0.29       branching      Distal calf                                 +---------------+-----------+----------------------+---------------+-----------+ Right Tech Comments: Varicose veins noted throughout right calf. Diagnosing physician: Sherald Hess MD Electronically signed by Sherald Hess MD on 07/27/2023 at 5:37:52 PM.    Final    PERIPHERAL VASCULAR CATHETERIZATION  Result Date: 07/27/2023 Images from the original result were not included.   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  Ultrasound-guided access right common femoral artery 2.  Aortogram with catheter selection of aorta 3.  Right lower extremity arteriogram with runoff from right femoral sheath 4.  Angioplasty and stent of right common iliac artery (8 mm x 39 mm VBX) 5.  Angioplasty and stent of the right external iliac artery (7 mm x 29 mm VBX) 6.  Mynx closure of the right common femoral artery 7.  49 minutes of monitored moderate conscious sedation time  Indications: 72 year old male seen with tissue loss in the right lower extremity.  He is well-known to vascular surgery and has had extensive interventions including previous right external iliac stenting by Dr. Edilia Bo, right common femoral endarterectomy, and failed right SFA stents.  He is high risk for limb loss.  He presents after risks and benefits discussed.  Findings:  Ultrasound-guided access right common femoral artery ( I could not feel a left femoral pulse).  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  His infrarenal aorta is patent.  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  There was a high grade stenosis distal to his existing right external iliac stent.  The right common femoral and profunda are patent.  The SFA has a flush occlusion.  Distally he  reconstitutes peroneal in the calf as the only flow into the foot.   We did a pullback pressure this dropped 30 mmHg from the infrarenal aorta to the distal right common iliac artery and this was then stented with an 8 mm x 39 mm VBX.  I then had a normal aortic pressure of 160 mm Hg distal right common iliac artery in the sheath after it was stented.  I then did an additional pullback pressure in the right external iliac artery there was another 20 mm Hg point drop through the external iliac stent with some high-grade stenosis in the distal segment.  This was extended with  a 7 mm x 29 mm VBX. Much better femoral pulse at completion.  No residual stenosis.               Procedure:  The patient was identified in the holding area and taken to room 8.  The patient was then placed supine on the table and prepped and draped in the usual sterile fashion.  A time out was called.  The patient received Versed and fentanyl for conscious moderate sedation.  Vital signs were monitored including heart rate, respiratory rate, oxygenation and blood pressure.  I was present for all of moderate sedation.  Ultrasound was used to evaluate the right common femoral artery.  It was patent .  A digital ultrasound image was acquired.  A micropuncture needle was used to access the right common femoral artery under ultrasound guidance.  An 018 wire was advanced without resistance and a micropuncture sheath was placed.  The 018 wire was removed and a benson wire was placed.  The micropuncture sheath was exchanged for a 5 french sheath.  An omniflush catheter was advanced over the wire to the level of L-1.  An abdominal angiogram was obtained.  We also get oblique images to look at the right iliac.  I then shot the right lower extremity runoff through the right femoral sheath.  Pertinent findings are noted above.  Ultimately elected to do a pullback pressure as he had diffuse disease in his right iliac artery.  His infrarenal aortic pressure was 160 mm Hg with a straight catheter and we pulled this into the distal right common iliac artery and the pressure dropped 30 mmHg.  I then elected to stent the right common iliac artery and used a Glidewire advantage and upsized to a 7 [place] Brite tip sheath.  Patient was given 100 units/kg IV heparin.  We stented the right common iliac artery with an 8 mm x 39 mm VBX with excellent results and no residual stenosis.  His right hypogastric was occluded on initial imaging and fills through collaterals off a lumbar.  When I did a pullback pressure into the external iliac  stent that was placed years ago this dropped another 20 mmHg.  I elected to extend the VBX stent in the iliac on the right external iliac with another 7 mm x 29 mm VBX.  Excellent results.  He now has a much better right femoral pulse.  Mynx closure was deployed in the right common femoral artery.  Plan: Excellent results after right common and external iliac stenting.  Now has a right femoral pulse that is bounding.  I discussed right femoral to peroneal bypass for limb salvage versus primary amputation.  This will be high risk as he is limited outflow into the foot with bypass.  He wishes to proceed with bypass for limb salvage.   Cristal Deer  Sondra Barges, MD [facility] Office: [phone]    VAS [place] ABI WITH/WO TBI  Result Date: 07/25/2023  LOWER EXTREMITY DOPPLER STUDY Patient Name:  [name]  Date of Exam:   07/25/2023 Medical Rec #: [id number]       Accession #:    [id number] Date of Birth: May 28, 1951       Patient Gender: M Patient Age:   72 years Exam Location:  [facility] Procedure:      VAS [place] ABI WITH/WO TBI Referring Phys: Sherald Hess --------------------------------------------------------------------------------  Indications: Peripheral artery disease. left AKA High Risk Factors: Hypertension, Diabetes, current smoker.  Vascular Interventions: 04/24/2020 Right femoral artery endarterectomy.                          12/11/2017 Angioplasty and stenting of the right                         external iliac artery with 7mm x 29 mm VBX stent. Comparison Study: 01/24/2023 ABI/TBI- right=0.71/0.00, left=AKA Performing Technologist: Gertie Fey MHA, RVT, RDCS, RDMS  Examination Guidelines: A complete evaluation includes at minimum, Doppler waveform signals and systolic blood pressure reading at the level of bilateral brachial, anterior tibial, and posterior tibial arteries, when vessel segments are accessible. Bilateral testing is considered an  integral part of a complete examination. Photoelectric Plethysmograph (PPG) waveforms and toe systolic pressure readings are included as required and additional duplex testing as needed. Limited examinations for reoccurring indications may be performed as noted.  ABI Findings: +---------+------------------+-----+----------+--------+ Right    Rt Pressure (mmHg)IndexWaveform  Comment  +---------+------------------+-----+----------+--------+ Brachial 85                                        +---------+------------------+-----+----------+--------+ PTA      48                0.56 monophasic         +---------+------------------+-----+----------+--------+ DP       43                0.51 monophasic         +---------+------------------+-----+----------+--------+ Great Toe                       Absent             +---------+------------------+-----+----------+--------+ +--------+------------------+-----+--------+-------+ Left    Lt Pressure (mmHg)IndexWaveformComment +--------+------------------+-----+--------+-------+ Brachial71                                     +--------+------------------+-----+--------+-------+  Right ABIs appear decreased compared to prior study on 01/24/2023.  Summary: Right: Resting right ankle-brachial index indicates moderate right lower extremity arterial disease. The right toe-brachial index is abnormal. *See table(s) above for measurements and observations.  Electronically signed by Sherald Hess MD on 07/25/2023 at 11:39:50 AM.    Final    VAS US AORTA/IVC/ILIACS  Result Date: 07/25/2023 ABDOMINAL AORTA STUDY Patient Name:  [name]  Date of Exam:   07/25/2023 Medical Rec #: [id number]       Accession #:    [id number] Date of Birth: Aug 08, 1951       Patient Gender: M Patient Age:   37 years  Exam Location:  [facility] Procedure:      VAS US AORTA/IVC/ILIACS Referring Phys: Sherald Hess  --------------------------------------------------------------------------------  Indications: Follow up right external iliac artery stent Risk Factors: Hypertension, Diabetes, current smoker. Vascular Interventions: 04/24/2020 Right femoral artery endarterectomy.                          12/11/2017 Angioplasty and stenting of the right                         external iliac artery with 7mm x 29 mm VBX stent. Limitations: Air/bowel gas, posterior acoustic shadowing secondary to calcification, obesity and patient discomfort.  Comparison Study: 01/24/2023 Aorta/iliac artery duplex- Diffuse atherosclerosis                   of the aorta and Right Iliac arteries. Bilateral common iliac                   arteries could not be identified. Right external iliac artery                   stent was not positively identified. 50-99% stenosis of the                   right external iliac artery. Performing Technologist: Gertie Fey MHA, RDMS, RVT, RDCS  Examination Guidelines: A complete evaluation includes B-mode imaging, spectral Doppler, color Doppler, and power Doppler as needed of all accessible portions of each vessel. Bilateral testing is considered an integral part of a complete examination. Limited examinations for reoccurring indications may be performed as noted.  Right Stent(s): +---------------+--------+---------------+----------+--------+ EIA            PSV cm/sStenosis       Waveform  Comments +---------------+--------+---------------+----------+--------+ Prox to Stent  338     50-99% stenosismonophasic         +---------------+--------+---------------+----------+--------+ Proximal Stent 129                    monophasic         +---------------+--------+---------------+----------+--------+ Mid Stent      72                     monophasic         +---------------+--------+---------------+----------+--------+ Distal Stent   103                    monophasic          +---------------+--------+---------------+----------+--------+ Distal to Stent76                     monophasic         +---------------+--------+---------------+----------+--------+    Summary: Stenosis: +--------------------+-------------+--------------+ Location            Stenosis     Stent          +--------------------+-------------+--------------+ Right External Iliac>50% stenosis1-49% stenosis +--------------------+-------------+--------------+   *See table(s) above for measurements and observations.  Electronically signed by Sherald Hess MD on 07/25/2023 at 11:39:40 AM.    Final     Labs:  Basic Metabolic Panel: Recent Labs  Lab 08/17/23 0523  NA 137  K 3.8  CL 98  CO2 29  GLUCOSE 98  BUN 12  CREATININE 0.80  CALCIUM 8.3*    CBC: Recent Labs  Lab 08/17/23 0523  WBC 4.2  NEUTROABS 2.3  HGB 9.4*  HCT 33.5*  MCV 70.5*  PLT 296    CBG: Recent Labs  Lab 08/22/23 2054 08/23/23 0602 08/23/23 1126 08/23/23 1627 08/23/23 2058  GLUCAP 104* 103* 99 109* 118*   Family history.  Mother with colon cancer and diabetes.  Denies any esophageal or rectal cancer  Brief HPI:   [name] is a 72 y.o. right-handed male with history of hyperlipidemia hypertension tobacco use as well as diabetes mellitus with peripheral artery disease with multiple revascularization procedures including right EIA stenting and right femoral endarterectomy April 2021, left AKA 02/19/2021 per Dr. Lenell Antu.  Per chart review lives with spouse.  Primarily used a wheelchair prior to admission.  Presented 07/30/2019 for significant pain of the right foot x 3 weeks as well as discoloration which progressed to some wounds on the toes and significant pain in the foot.  He was seen by podiatry who recommended urgent evaluation with vascular surgery concerns of ischemic right foot.  He has known SFA occlusion with single-vessel peroneal runoff.  ABIs decreased from prior study 6 months ago.  Patient  underwent redo exposure of right common femoral artery harvest of right lower extremity great saphenous vein with right common femoral artery to peroneal artery bypass with ipsilateral reversed greater saphenous vein 08/09/2023 per Dr. Chestine Spore.  He remained on low-dose aspirin and Plavix as prior to admission.  Subcutaneous heparin for DVT prophylaxis.  Acute on chronic anemia latest hemoglobin of 9.0.  Therapy evaluations completed due to patient's decreased functional mobility was admitted for comprehensive rehab program.   Hospital Course: [name] was admitted to rehab 08/16/2023 for inpatient therapies to consist of PT, ST and OT at least three hours five days a week. Past admission physiatrist, therapy team and rehab RN have worked together to provide customized collaborative inpatient rehab.  Pertaining to patient's debility related to PAD status post right common femoral to peroneal bypass for critical limb ischemia with tissue loss 08/09/2023 as well as history of left AKA 2022.  Patient would follow-up with vascular surgery.  In regards to patient left AKA he did receive follow-up by [facility] for modifications and prosthesis.  Lovenox for DVT prophylaxis no bleeding episodes he remained on aspirin and Plavix prior to admission.  Pain manager use of Neurontin as well as baclofen as needed with oxycodone for breakthrough pain.  Acute on chronic anemia latest hemoglobin 9.4 no bleeding episodes.  Hemoglobin A1c 6.4 Glucophage 1000 mg twice daily with diabetic teaching.  Lipitor and Zetia ongoing for hyperlipidemia.  He did have some mild hypotension with reduction in his Bystolic and the addition of low-dose midodrine.  Protonix ongoing for GERD.  Obesity BMI 29.66 dietary follow-up.  Tobacco abuse with Nicorette gum added providing counts regards to cessation of nicotine products.  He did have some constipation resolved with laxative assistance.   Blood pressures were monitored on TID basis  and monitored  Diabetes has been monitored with ac/hs CBG checks and SSI was use prn for tighter BS control.    Rehab course: During patient's stay in rehab weekly team conferences were held to monitor patient's progress, set goals and discuss barriers to discharge. At admission, patient required moderate assist squat pivot transfer supervision sit to supine  Physical exam.  Blood pressure 118/70 pulse 80 temperature 98 respirations 18 oxygen saturation 92% room air Constitutional.  No acute distress HEENT Head.  Normocephalic and atraumatic Eyes.  Pupils round and reactive to light no discharge without nystagmus Neck.  Supple  nontender no JVD without thyromegaly Cardiac regular rate and rhythm without any extra sounds or murmur heard Abdomen.  Soft nontender positive bowel sounds without rebound Respiratory effort normal no respiratory distress without wheeze Skin.  Right peroneal bypass site with palpable pulses at the graft.  Left AKA well-healed.  He/She  has had improvement in activity tolerance, balance, postural control as well as ability to compensate for deficits. He/She has had improvement in functional use RUE/LUE  and RLE/LLE as well as improvement in awareness.  Supervision level for all mobility from wheelchair squat pivot transfer from bed to wheelchair and car transfers.  He was able to gather his belongings for activities day living and homemaking.  Full family teaching completed plan discharge to home       Disposition: Discharge to home    Diet: Diabetic diet  Special Instructions: No driving smoking or alcohol  Medications at discharge 1.  Tylenol as needed 2.  Vitamin C 500 mg p.o. twice daily 3.  Lipitor 40 mg p.o. daily 4.  Baclofen 5 mg p.o. TID  muscle spasms 5.  Plavix 75 mg p.o. daily 6.  Colace 100 mg p.o. daily 7.  Zetia 10 mg p.o. daily 8.  Prozac 10 mg p.o. daily 9.  Neurontin 600 mg p.o. 2 times daily 10.  Melatonin 6 mg p.o. nightly 11.   Glucophage 1000 mg p.o. twice daily with meals 12.  ProAmatine 5 mg p.o. 3 times daily with meals 13.  Bystolic 10 mg p.o. daily 14.  Oxycodone  10 mg every 4 hours as needed pain 15.  Protonix 40 mg p.o. daily 16.  MiraLAX daily hold for loose stools 17.  Zinc sulfate 220 mg p.o. daily 18.  Ventolin inhaler 2 puffs every 6 hours as needed shortness of breath 19.  Aspirin 81 mg daily  30-35 minutes were spent completing discharge summary and discharge planning     Follow-up Information     Lovorn, Aundra Millet, MD Follow up.   Specialty: Physical Medicine and Rehabilitation Why: No formal follow-up needed Contact information: [address] [place] [address] [phone]         Cephus Shelling, MD Follow up.   Specialty: Vascular Surgery Why: Call for appointment Contact information: [address] [place] [address] [phone]                 Signed: Mcarthur Rossetti [place]ia Pirie 08/24/2023, 5:08 AM

## 2023-08-22 NOTE — Progress Notes (Signed)
PROGRESS NOTE   Subjective/Complaints:  Pt reports pain is his biggest problem- wants to increase meds- only took tylenol at home.  Pain in shoulders, R leg and foot- rating 7.5-8/10 again this AM LBM yesterday     ROS:   Pt denies SOB, abd pain, CP, N/V/C/D, and vision changes  Except for HPI  Objective:   No results found. No results for input(s): "WBC", "HGB", "HCT", "PLT" in the last 72 hours.  No results for input(s): "NA", "K", "CL", "CO2", "GLUCOSE", "BUN", "CREATININE", "CALCIUM" in the last 72 hours.   Intake/Output Summary (Last 24 hours) at 08/22/2023 0839 Last data filed at 08/22/2023 0456 Gross per 24 hour  Intake 580 ml  Output 1565 ml  Net -985 ml     Pressure Injury 08/16/23 Buttocks Left Stage 2 -  Partial thickness loss of dermis presenting as a shallow open injury with a red, pink wound bed without slough. Small stage 2 just inside left buttock (Active)  08/16/23 1720  Location: Buttocks  Location Orientation: Left  Staging: Stage 2 -  Partial thickness loss of dermis presenting as a shallow open injury with a red, pink wound bed without slough.  Wound Description (Comments): Small stage 2 just inside left buttock  Present on Admission: Yes    Physical Exam: Vital Signs Blood pressure 120/71, pulse 68, temperature 98.1 F (36.7 C), resp. rate 17, height 5\' 7"  (1.702 m), weight 82.6 kg, SpO2 96%.       General: awake, alert, appropriate, sitting EOB; eating breakfast; NAD HENT: conjugate gaze; oropharynx moist CV: regular rate and rhythm; no JVD Pulmonary: CTA B/L; no W/R/R- good air movement GI: soft, NT, ND, (+)BS- normoactive Psychiatric: appropriate- asking to leave Neurological: Ox3  Saw pt jerking from painful muscle spasms this AM Ext: no clubbing, cyanosis, or edema Psych: pleasant and cooperative  Neurological: Ox3 MS: L AKA- well healed- has prosthesis at his side Skin:  healing/glued 3 incision on R interior groin/thigh and calf- from bypass surgery- unchanged. Buttock wound with foam dressing- leg looking good- R toes- dry gangrene-   Assessment/Plan: 1. Functional deficits which require 3+ hours per day of interdisciplinary therapy in a comprehensive inpatient rehab setting. Physiatrist is providing close team supervision and 24 hour management of active medical problems listed below. Physiatrist and rehab team continue to assess barriers to discharge/monitor patient progress toward functional and medical goals  Care Tool:  Bathing    Body parts bathed by patient: Right arm, Left arm, Chest, Abdomen, Front perineal area, Right upper leg, Left upper leg   Body parts bathed by helper: Buttocks, Right lower leg Body parts n/a: Left lower leg (L AKA)   Bathing assist Assist Level: Minimal Assistance - Patient > 75%     Upper Body Dressing/Undressing Upper body dressing   What is the patient wearing?: Pull over shirt    Upper body assist Assist Level: Contact Guard/Touching assist    Lower Body Dressing/Undressing Lower body dressing            Lower body assist Assist for lower body dressing: Minimal Assistance - Patient > 75%     Editor, commissioning  assist Assist for toileting: Maximal Assistance - Patient 25 - 49%     Transfers Chair/bed transfer  Transfers assist     Chair/bed transfer assist level: Supervision/Verbal cueing     Locomotion Ambulation   Ambulation assist   Ambulation activity did not occur: Safety/medical concerns (fatigue, pain)          Walk 10 feet activity   Assist  Walk 10 feet activity did not occur: Safety/medical concerns        Walk 50 feet activity   Assist Walk 50 feet with 2 turns activity did not occur: Safety/medical concerns         Walk 150 feet activity   Assist Walk 150 feet activity did not occur: Safety/medical concerns         Walk 10 feet on  uneven surface  activity   Assist Walk 10 feet on uneven surfaces activity did not occur: Safety/medical concerns         Wheelchair     Assist Is the patient using a wheelchair?: Yes Type of Wheelchair: Manual    Wheelchair assist level: Supervision/Verbal cueing Max wheelchair distance: 75 ft    Wheelchair 50 feet with 2 turns activity    Assist        Assist Level: Supervision/Verbal cueing   Wheelchair 150 feet activity     Assist      Assist Level: Maximal Assistance - Patient 25 - 49%   Blood pressure 120/71, pulse 68, temperature 98.1 F (36.7 C), resp. rate 17, height 5\' 7"  (1.702 m), weight 82.6 kg, SpO2 96%.   Medical Problem List and Plan: 1. Functional deficits secondary to debility related to PAD status post right common femoral to peroneal bypass for critical limb ischemia with tissue loss 08/09/2023 per Dr. Chestine Spore as well as history of left AKA 2022.             -patient may shower but incisions must be covered             -ELOS/Goals: modI 7-10 days         Con't CIR PT and OT  Team conference today to determine length of stay  Pt limited by orthostasis and pain.  2.  Antithrombotics: -DVT/anticoagulation:  Pharmaceutical: Heparin 8/22- will change to Lovenox since has been 4+ days since surgery and Cr/BUN allow it- less injections             -antiplatelet therapy: Aspirin 81 mg daily and Plavix 75 mg daily 3. Pain Management: Neurontin 300 mg 3 times daily, baclofen 5 mg twice daily as needed, oxycodone as needed  8/22- pt reports bed really bothering back/buttocks- will get air bed  -pain reports just got pain meds-taking 2 tabs usually- and wil monitor if needs to increase since rating pain 6/10 this AM, before meds kicked in.   8/24 pt reported to me that pain was adequately controlled-   8/25- pt reports pain 8/10 this AM, from standing- doesn't want to change meds.   8/27- asking for increasing pain meds- will change Percocet to  Oxy 10-15 mg q4 hours- didn't take anything but tylenol at home.  4. Mood/Behavior/Sleep: Prozac 10 mg daily.  Provide emotional support             -antipsychotic agents: N/A 5. Neuropsych/cognition: This patient is capable of making decisions on his own behalf. 6. Skin/Wound Care: Routine skin checks  8/22- R thigh/groin and Claf look good- L AKA healed-   8/24-25  pressure relief, dressing to buttock area 7. Fluids/Electrolytes/Nutrition: Routine in and outs with follow-up chemistries 8.  Acute on chronic anemia.  Follow-up CBC  8/22- Hb 9.4 stable to increasing- will monitor weekly.  9.  Diabetes mellitus with peripheral neuropathy.  Hemoglobin A1c 6.4.  Glucophage 1000 mg twice daily.  8/22- Cbgs running 104-117- doing great- will con't regimen  8/23- CBG's 99-135- con't regimen  8/26- CBG's running 89-123- con't regimen 10.  Hyperlipidemia.  Lipitor/Zetia 11.  Hypertension with hypotension.  Reduce Bystolic to 10mg  daily given hypotension. Monitor with increased mobility  8/22- BP's a little soft- in 110s-110s- will monitor with therapy if has orthostaitc hypotension  8/23- BP's running 87/52 up to 126/66- don't feel comofrtable stopping Bystolic- will add Midodrine 2.5 mg TID and monitor for orthostatic hypotension more. Will order Orthostatics daily x 5 days  8/24 I don't see any orthostatics. Will order  8/26- Orthostatics done- look good this AM- con't regimen  8/27- increased midodrine to 5 mg TID yesterday- will see if better- if not, will increase midodrine.  12.  GERD.  Continue Protonix 13.  Obesity.  BMI 29.66.  Dietary follow-up  8/22- Down to 28.52 14.  Tobacco abuse.  Nicorette gum as needed.  Provide counseling 15.  Constipation.  Continue MiraLAX daily, Colace 100 mg daily  8/22- Pt reports LBM yesterday- denies constipation.   LBM 8/22  8/25- LBM yesterday  8/27- LBM yesterday 16. Insomnia  8/22- will add Melatonin 6 mg at bedtime.   8/24 sleeping better esp with  air bed.  17. Dry gangrene of R foot  8/27- will add Vit C, Zinc, and betadine of R foot/toes due to gangrene and Ensure max so doesn't affect BG's as much.    I spent a total of  52  minutes on total care today- >50% coordination of care- due to  Team conference today- to determine length of stay- also d/w nursing coordinator about wound care- and prolonged d/w pt about pain mgmt.   LOS: 6 days A FACE TO FACE EVALUATION WAS PERFORMED  Amarria Andreasen 08/22/2023, 8:39 AM

## 2023-08-22 NOTE — Progress Notes (Signed)
Patient ID: [name], male   DOB: 12/31/1950, 72 y.o.   MRN: [id number]  Met with pt to give him the team conference update regarding goals of supervision level and discharge date 8/29. Pt feels ready to go home and can manage there. He feels he has all of his equipment from past admissions and made aware he will have home health follow up at home. [facility] was following while on acute and have let know discharge date. Pt reports he will let his wife know and feels worker does not need to contact her. Continue to work on discharge needs.

## 2023-08-22 NOTE — Progress Notes (Signed)
Updated board and wound care plan in room, discussed with nurse. Patient in room resting.

## 2023-08-22 NOTE — Patient Care Conference (Signed)
Inpatient RehabilitationTeam Conference and Plan of Care Update Date: 08/22/2023   Time: 11:44 AM    Patient Name: [name]      Medical Record Number: [id number]  Date of Birth: 01-24-1951 Sex: Male         Room/Bed: 4M04C/4M04C-01 Payor Info: Payor: [employer] MEDICARE / Plan: [employer] MEDICARE HMO/PPO / Product Type: *No Product type* /    Admit Date/Time:  08/16/2023  4:35 PM  Primary Diagnosis:  PAD (peripheral artery disease) [facility])  Hospital Problems: Principal Problem:   PAD (peripheral artery disease) (HCC)    Expected Discharge Date: Expected Discharge Date: 08/24/23  Team Members Present: Physician leading conference: Dr. Genice Rouge Social Worker Present: Dossie Der, LCSW Nurse Present: Vedia Pereyra, RN PT Present: Truitt Leep, PT OT Present: Velia Meyer, OT PPS Coordinator present : Fae Pippin, SLP     Current Status/Progress Goal Weekly Team Focus  Bowel/Bladder   Pt contintent to bowth bowel and bladder LBM 8/26   Pt to remain continent to bowel and bladder   Assist every shift and PRN    Swallow/Nutrition/ Hydration               ADL's   SBA for all ADLs and functional transfers   supervision overall   full ADL for increased hygiene    Mobility   supervision bed mobility +squat pivot + car transfer   supevision  safety with sequencing with car transfers to reduce caregiver burden    Communication                Safety/Cognition/ Behavioral Observations               Pain   Pain 8/10 in right lower leg requiring the higher dosage of percocet every 4 hours   pain <3/10   nonpharmalogical interventions, assess t pain every shift and prn    Skin   Right 2nd and 3rd toe necrotic, Right leg incision dermabond open to air, edges aproximated no drainage , stage 2 sacrum   wounds to show signs of healing, no drainage  assess ever shift and PRn      Discharge Planning:  HOme with wife who can only provide supervision level,  pt making progress slowly. Will arrange [facility] and DME once have recommendations   Team Discussion: PAD. Wound care plan in place. Incision to right leg with attached edges, no drainage Stage II to buttocks with partial granulated tissue. Right 2nd and 3rd toe are necrotic with thready pulse.  Uncontrolled pain. Has PRN for Baclofen. Blood pressure improved. Orthostatic blood pressures daily. Heart healthy diet. AC/HS.  Patient on target to meet rehab goals: yes, will be at goal level with discharge date of 08/24/23  *See Care Plan and progress notes for long and short-term goals.   Revisions to Treatment Plan:  Oxycodone IR 10-15mg  every 4 hours.  Midodrine increased to 5mg . Monitor labs/VS  Teaching Needs: Medications, safety, self care, gait/transfer training, skin/wound care, etc.   Current Barriers to Discharge: Decreased caregiver support, Wound care, and Medication compliance  Possible Resolutions to Barriers: Family education Independent with skin/wound care Adhere to diet/medication modifications     Medical Summary Current Status: continent of B/B- likes pain meds q4 hours- has baclofen for spasms as needed- incision from bypass- looks OK- stage II buttocks- dry gangrene on R foot-  Barriers to Discharge: Hypotension;Medical stability;Weight bearing restrictions;Self-care education;Uncontrolled Pain;Other (comments)  Barriers to Discharge Comments: going home with wife- limited by self-  and pain0- muscle spasms- has baclofen, but won't take regularly. Possible Resolutions to [employer]: d/c 8/29- working on increasing pain meds- and add betadine for dry gangrene and ensure max   Continued Need for Acute Rehabilitation Level of Care: The patient requires daily medical management by a physician with specialized training in physical medicine and rehabilitation for the following reasons: Direction of a multidisciplinary physical rehabilitation program to maximize functional  independence : Yes Medical management of patient stability for increased activity during participation in an intensive rehabilitation regime.: Yes Analysis of laboratory values and/or radiology reports with any subsequent need for medication adjustment and/or medical intervention. : Yes   I attest that I was present, lead the team conference, and concur with the assessment and plan of the team.   Jearld Adjutant 08/22/2023, 3:21 PM

## 2023-08-22 NOTE — Progress Notes (Signed)
Physical Therapy Session Note  Patient Details  Name: [name] MRN: [id number] Date of Birth: March 11, 1951  Today's Date: 08/22/2023 PT Individual Time: 0800-0855 PT Individual Time Calculation (min): 55 min   Short Term Goals: Week 1:  PT Short Term Goal 1 (Week 1): STG's=LTG's due to ELOS  Skilled Therapeutic Interventions/Progress Updates:      Therapy Documentation Precautions:  Precautions Precautions: Fall Precaution Comments: L AKA Restrictions Weight Bearing Restrictions: No  Pt received semi-reclined in bed, agreeable to PT despite significant bilateral shoulder and R LE pain. Pt pre-medicated and provided rest/repositioning for relief.   Pt at supervision level for all mobility in session including bed mobility, squat pivot transfers from bed<>w/c, and car transfers.   Pt agreeable to R LE exercises and performed 1 x 10 with 0.75 # ankle weight and 3 x 12 with 1.5 # ankle weight with R LAQ. Pt deferred further LE exercises due to pain and transitioned to UE range of motion exercises as pt reports stiffness. Pt utilized portable UE bike to address ROM and activity tolerance deficits:   1 set x 2.30 s   1 set x 1.30 s  Pt reported dizziness and returned to room and vitals assessed seated in w/c at bedside:   Pulse: 58  O2: 100  BP: 104/69 (80)  Pt returned to bed and left semi-reclined with all needs in reach.     Therapy/Group: Individual Therapy  Truitt Leep Truitt Leep PT, DPT  08/22/2023, 8:29 AM

## 2023-08-23 ENCOUNTER — Other Ambulatory Visit: Payer: Self-pay | Admitting: *Deleted

## 2023-08-23 ENCOUNTER — Other Ambulatory Visit (HOSPITAL_COMMUNITY): Payer: Self-pay

## 2023-08-23 DIAGNOSIS — I739 Peripheral vascular disease, unspecified: Secondary | ICD-10-CM | POA: Diagnosis not present

## 2023-08-23 DIAGNOSIS — I10 Essential (primary) hypertension: Secondary | ICD-10-CM

## 2023-08-23 LAB — GLUCOSE, CAPILLARY
Glucose-Capillary: 103 mg/dL — ABNORMAL HIGH (ref 70–99)
Glucose-Capillary: 99 mg/dL (ref 70–99)
Glucose-Capillary: 109 mg/dL — ABNORMAL HIGH (ref 70–99)
Glucose-Capillary: 118 mg/dL — ABNORMAL HIGH (ref 70–99)

## 2023-08-23 MED ORDER — NEBIVOLOL HCL 10 MG PO TABS
10.0000 mg | ORAL_TABLET | Freq: Every day | ORAL | 0 refills | Status: DC
  Filled 2023-08-23: qty 30, 30d supply, fill #0

## 2023-08-23 MED ORDER — GABAPENTIN 300 MG PO CAPS
600.0000 mg | ORAL_CAPSULE | Freq: Two times a day (BID) | ORAL | Status: DC
  Administered 2023-08-23 – 2023-08-24 (×2): 600 mg via ORAL
  Filled 2023-08-23 (×2): qty 2

## 2023-08-23 MED ORDER — METFORMIN HCL 1000 MG PO TABS
1000.0000 mg | ORAL_TABLET | Freq: Two times a day (BID) | ORAL | 0 refills | Status: DC
  Filled 2023-08-23: qty 60, 30d supply, fill #0

## 2023-08-23 MED ORDER — FLUOXETINE HCL 10 MG PO CAPS
10.0000 mg | ORAL_CAPSULE | Freq: Every day | ORAL | 0 refills | Status: DC
  Filled 2023-08-23: qty 30, 30d supply, fill #0

## 2023-08-23 MED ORDER — PANTOPRAZOLE SODIUM 40 MG PO TBEC
40.0000 mg | DELAYED_RELEASE_TABLET | Freq: Every day | ORAL | 0 refills | Status: DC
  Filled 2023-08-23: qty 30, 30d supply, fill #0

## 2023-08-23 MED ORDER — CLOPIDOGREL BISULFATE 75 MG PO TABS
75.0000 mg | ORAL_TABLET | Freq: Every day | ORAL | 0 refills | Status: DC
  Filled 2023-08-23: qty 30, 30d supply, fill #0

## 2023-08-23 MED ORDER — OXYCODONE HCL 10 MG PO TABS
10.0000 mg | ORAL_TABLET | ORAL | 0 refills | Status: DC | PRN
  Filled 2023-08-23: qty 30, 5d supply, fill #0

## 2023-08-23 MED ORDER — BACLOFEN 5 MG PO TABS
5.0000 mg | ORAL_TABLET | Freq: Three times a day (TID) | ORAL | 0 refills | Status: DC
Start: 2023-08-23 — End: 2024-03-04
  Filled 2023-08-23 (×2): qty 90, 30d supply, fill #0

## 2023-08-23 MED ORDER — BACLOFEN 5 MG HALF TABLET
5.0000 mg | ORAL_TABLET | Freq: Three times a day (TID) | ORAL | Status: DC
  Administered 2023-08-23 – 2023-08-24 (×4): 5 mg via ORAL
  Filled 2023-08-23 (×4): qty 1

## 2023-08-23 MED ORDER — GABAPENTIN 300 MG PO CAPS
600.0000 mg | ORAL_CAPSULE | Freq: Two times a day (BID) | ORAL | 0 refills | Status: DC
Start: 2023-08-23 — End: 2023-10-08
  Filled 2023-08-23 (×2): qty 120, 30d supply, fill #0

## 2023-08-23 MED ORDER — ACETAMINOPHEN 325 MG PO TABS: 325.0000 mg | ORAL_TABLET | ORAL | Status: DC | PRN

## 2023-08-23 MED ORDER — ZINC SULFATE 220 (50 ZN) MG PO TABS
220.0000 mg | ORAL_TABLET | Freq: Every day | ORAL | 0 refills | Status: DC
  Filled 2023-08-23: qty 14, 14d supply, fill #0

## 2023-08-23 MED ORDER — EZETIMIBE 10 MG PO TABS
10.0000 mg | ORAL_TABLET | Freq: Every day | ORAL | 0 refills | Status: DC
Start: 2023-08-23 — End: 2023-10-22
  Filled 2023-08-23: qty 30, 30d supply, fill #0

## 2023-08-23 MED ORDER — DOCUSATE SODIUM 100 MG PO CAPS: 100.0000 mg | ORAL_CAPSULE | Freq: Every day | ORAL | Status: DC

## 2023-08-23 MED ORDER — OXYCODONE HCL 5 MG PO TABS
10.0000 mg | ORAL_TABLET | ORAL | Status: DC | PRN
  Administered 2023-08-23 – 2023-08-24 (×5): 10 mg via ORAL
  Filled 2023-08-23 (×5): qty 2

## 2023-08-23 MED ORDER — ZINC SULFATE 220 (50 ZN) MG PO CAPS
220.0000 mg | ORAL_CAPSULE | Freq: Every day | ORAL | 0 refills | Status: DC
  Filled 2023-08-23: qty 30, 30d supply, fill #0

## 2023-08-23 MED ORDER — MIDODRINE HCL 5 MG PO TABS
5.0000 mg | ORAL_TABLET | Freq: Three times a day (TID) | ORAL | 0 refills | Status: DC
  Filled 2023-08-23: qty 90, 30d supply, fill #0

## 2023-08-23 MED ORDER — AMLODIPINE BESYLATE 5 MG PO TABS
5.0000 mg | ORAL_TABLET | Freq: Every day | ORAL | 3 refills | Status: DC
Start: 2023-08-23 — End: 2023-08-24

## 2023-08-23 MED ORDER — MELATONIN 3 MG PO TABS
6.0000 mg | ORAL_TABLET | Freq: Every day | ORAL | 0 refills | Status: DC
  Filled 2023-08-23: qty 60, 30d supply, fill #0

## 2023-08-23 MED ORDER — GABAPENTIN 300 MG PO CAPS
300.0000 mg | ORAL_CAPSULE | Freq: Three times a day (TID) | ORAL | 0 refills | Status: DC
  Filled 2023-08-23: qty 90, 30d supply, fill #0

## 2023-08-23 MED ORDER — ALBUTEROL SULFATE HFA 108 (90 BASE) MCG/ACT IN AERS
1.0000 | INHALATION_SPRAY | Freq: Four times a day (QID) | RESPIRATORY_TRACT | 1 refills | Status: DC | PRN
  Filled 2023-08-23: qty 18, 25d supply, fill #0

## 2023-08-23 MED ORDER — ASCORBIC ACID 500 MG PO TABS
500.0000 mg | ORAL_TABLET | Freq: Two times a day (BID) | ORAL | 0 refills | Status: DC
  Filled 2023-08-23: qty 100, 50d supply, fill #0

## 2023-08-23 MED ORDER — BACLOFEN 5 MG PO TABS
5.0000 mg | ORAL_TABLET | Freq: Two times a day (BID) | ORAL | 0 refills | Status: DC | PRN
  Filled 2023-08-23: qty 30, 15d supply, fill #0

## 2023-08-23 MED ORDER — ATORVASTATIN CALCIUM 40 MG PO TABS
40.0000 mg | ORAL_TABLET | Freq: Every day | ORAL | 0 refills | Status: DC
Start: 2023-08-23 — End: 2023-10-30
  Filled 2023-08-23: qty 30, 30d supply, fill #0

## 2023-08-23 NOTE — Progress Notes (Signed)
PROGRESS NOTE   Subjective/Complaints:  Pt reports pain 8/10 this AM, but hasn't had pain meds in "awhile"- due this AM.  Thinks pain overall a little better with increased dose of oxycodone.   BM's going "OK".   D/w pharmacy- increased baclofen  to TID scheduled and increased gabapentin.    ROS:    Pt denies SOB, abd pain, CP, N/V/C/D, and vision changes   Except for HPI  Objective:   No results found. No results for input(s): "WBC", "HGB", "HCT", "PLT" in the last 72 hours.  No results for input(s): "NA", "K", "CL", "CO2", "GLUCOSE", "BUN", "CREATININE", "CALCIUM" in the last 72 hours.   Intake/Output Summary (Last 24 hours) at 08/23/2023 1004 Last data filed at 08/23/2023 0919 Gross per 24 hour  Intake 476 ml  Output 1250 ml  Net -774 ml     Pressure Injury 08/16/23 Buttocks Left Stage 2 -  Partial thickness loss of dermis presenting as a shallow open injury with a red, pink wound bed without slough. Small stage 2 just inside left buttock (Active)  08/16/23 1720  Location: Buttocks  Location Orientation: Left  Staging: Stage 2 -  Partial thickness loss of dermis presenting as a shallow open injury with a red, pink wound bed without slough.  Wound Description (Comments): Small stage 2 just inside left buttock  Present on Admission: Yes    Physical Exam: Vital Signs Blood pressure 125/66, pulse 65, temperature 98 F (36.7 C), resp. rate 18, height 5\' 7"  (1.702 m), weight 82.6 kg, SpO2 100%.        General: awake, alert, appropriate, sitting up in bed;  NAD HENT: conjugate gaze; oropharynx moist CV: regular rate and rhythm; no JVD Pulmonary: CTA B/L; no W/R/R- good air movement GI: soft, NT, ND, (+)BS Psychiatric: appropriate- interactive, pleasant, sweet Neurological: Ox3  Saw pt jerking from painful muscle spasms this AM- seen- occurs frequently- ~ q 1-3 minutes Ext: no clubbing, cyanosis, or  edema Psych: pleasant and cooperative  Neurological: Ox3 MS: L AKA- well healed- has prosthesis at his side Skin: healing/glued 3 incision on R interior groin/thigh and calf- from bypass surgery- unchanged. Buttock wound with foam dressing- leg looking good- R toes- dry gangrene-   Assessment/Plan: 1. Functional deficits which require 3+ hours per day of interdisciplinary therapy in a comprehensive inpatient rehab setting. Physiatrist is providing close team supervision and 24 hour management of active medical problems listed below. Physiatrist and rehab team continue to assess barriers to discharge/monitor patient progress toward functional and medical goals  Care Tool:  Bathing    Body parts bathed by patient: Right arm, Left arm, Chest, Abdomen, Front perineal area, Right upper leg, Left upper leg, Buttocks, Right lower leg, Face   Body parts bathed by helper: Buttocks, Right lower leg Body parts n/a: Left lower leg (L AKA)   Bathing assist Assist Level: Supervision/Verbal cueing     Upper Body Dressing/Undressing Upper body dressing   What is the patient wearing?: Pull over shirt    Upper body assist Assist Level: Set up assist    Lower Body Dressing/Undressing Lower body dressing  Lower body assist Assist for lower body dressing: Set up assist     Toileting Toileting    Toileting assist Assist for toileting: Supervision/Verbal cueing     Transfers Chair/bed transfer  Transfers assist     Chair/bed transfer assist level: Supervision/Verbal cueing     Locomotion Ambulation   Ambulation assist   Ambulation activity did not occur: Safety/medical concerns (fatigue, pain)          Walk 10 feet activity   Assist  Walk 10 feet activity did not occur: Safety/medical concerns        Walk 50 feet activity   Assist Walk 50 feet with 2 turns activity did not occur: Safety/medical concerns         Walk 150 feet activity   Assist  Walk 150 feet activity did not occur: Safety/medical concerns         Walk 10 feet on uneven surface  activity   Assist Walk 10 feet on uneven surfaces activity did not occur: Safety/medical concerns         Wheelchair     Assist Is the patient using a wheelchair?: Yes Type of Wheelchair: Manual    Wheelchair assist level: Supervision/Verbal cueing Max wheelchair distance: 75 ft    Wheelchair 50 feet with 2 turns activity    Assist        Assist Level: Supervision/Verbal cueing   Wheelchair 150 feet activity     Assist      Assist Level: Maximal Assistance - Patient 25 - 49%   Blood pressure 125/66, pulse 65, temperature 98 F (36.7 C), resp. rate 18, height 5\' 7"  (1.702 m), weight 82.6 kg, SpO2 100%.   Medical Problem List and Plan: 1. Functional deficits secondary to debility related to PAD status post right common femoral to peroneal bypass for critical limb ischemia with tissue loss 08/09/2023 per Dr. Chestine Spore as well as history of left AKA 2022.             -patient may shower but incisions must be covered             -ELOS/Goals: modI 7-10 days         D/c 8/29  Con't CIR PT and OT  Will not need f/u with me- since is s/p bypass- will need f/u with surgeon for f/u and pain meds  Increased gabapentin and baclofen for pt- as wel as con't midodrine for orthostatic hypotension  2.  Antithrombotics: -DVT/anticoagulation:  Pharmaceutical: Heparin 8/22- will change to Lovenox since has been 4+ days since surgery and Cr/BUN allow it- less injections             -antiplatelet therapy: Aspirin 81 mg daily and Plavix 75 mg daily 3. Pain Management: Neurontin 300 mg 3 times daily, baclofen 5 mg twice daily as needed, oxycodone as needed  8/22- pt reports bed really bothering back/buttocks- will get air bed  -pain reports just got pain meds-taking 2 tabs usually- and wil monitor if needs to increase since rating pain 6/10 this AM, before meds kicked in.    8/24 pt reported to me that pain was adequately controlled-   8/25- pt reports pain 8/10 this AM, from standing- doesn't want to change meds.   8/27- asking for increasing pain meds- will change Percocet to Oxy 10-15 mg q4 hours- didn't take anything but tylenol at home.   8/28- pain 8/10 before meds this AM- will increase baclofen to 5 mg TID due to spasms and  increase gabapentin to 600 mg BID- to help nerve pain- will need to get pain meds from surgeon after d/c. Doesn't need to see me, but will give 7 days of pain meds after d/c 4. Mood/Behavior/Sleep: Prozac 10 mg daily.  Provide emotional support             -antipsychotic agents: N/A 5. Neuropsych/cognition: This patient is capable of making decisions on his own behalf. 6. Skin/Wound Care: Routine skin checks  8/22- R thigh/groin and Claf look good- L AKA healed-   8/24-25 pressure relief, dressing to buttock area 7. Fluids/Electrolytes/Nutrition: Routine in and outs with follow-up chemistries 8.  Acute on chronic anemia.  Follow-up CBC  8/22- Hb 9.4 stable to increasing- will monitor weekly.  9.  Diabetes mellitus with peripheral neuropathy.  Hemoglobin A1c 6.4.  Glucophage 1000 mg twice daily.  8/22- Cbgs running 104-117- doing great- will con't regimen  8/23- CBG's 99-135- con't regimen  8/26- CBG's running 89-123- con't regimen  8/28- CBGs running 100's ot 110- great- con't regimen 10.  Hyperlipidemia.  Lipitor/Zetia 11.  Hypertension with hypotension.  Reduce Bystolic to 10mg  daily given hypotension. Monitor with increased mobility  8/22- BP's a little soft- in 110s-110s- will monitor with therapy if has orthostaitc hypotension  8/23- BP's running 87/52 up to 126/66- don't feel comofrtable stopping Bystolic- will add Midodrine 2.5 mg TID and monitor for orthostatic hypotension more. Will order Orthostatics daily x 5 days  8/24 I don't see any orthostatics. Will order  8/26- Orthostatics done- look good this AM- con't  regimen  8/27- increased midodrine to 5 mg TID yesterday- will see if better- if not, will increase midodrine.   8/28- per therapy, BP doing better on Midodrine.  12.  GERD.  Continue Protonix 13.  Obesity.  BMI 29.66.  Dietary follow-up  8/22- Down to 28.52 14.  Tobacco abuse.  Nicorette gum as needed.  Provide counseling 15.  Constipation.  Continue MiraLAX daily, Colace 100 mg daily  8/22- Pt reports LBM yesterday- denies constipation.   LBM 8/22  8/25- LBM yesterday  8/27- LBM yesterday 16. Insomnia  8/22- will add Melatonin 6 mg at bedtime.   8/24 sleeping better esp with air bed.  17. Dry gangrene of R foot  8/27- will add Vit C, Zinc, and betadine of R foot/toes due to gangrene and Ensure max so doesn't affect BG's as much.     I spent a total of  35  minutes on total care today- >50% coordination of care- due to  Pain still limiting, sp spoke with pharmacy about increasing other meds to help.   LOS: 7 days A FACE TO FACE EVALUATION WAS PERFORMED  [name] 08/23/2023, 10:04 AM

## 2023-08-23 NOTE — Progress Notes (Signed)
Inpatient Rehabilitation Care Coordinator Discharge Note   Patient Details  Name: [name] MRN: [id number] Date of Birth: 10-13-1951   Discharge location: HOME WITH WIFE WHO IS ABLE TO PROVIDE SUPERVISION LEVEL  Length of Stay: 8 Days  Discharge activity level: SUPERVISION LEVEL  Home/community participation: SEDENTARY  Patient response VO:[id number] Literacy - How often do you need to have someone help you when you read instructions, pamphlets, or other written material from your doctor or pharmacy?: Never  Patient response IH:[id number] Isolation - How often do you feel lonely or isolated from those around you?: Rarely  Services provided included: MD, RD, PT, OT, RN, CM, Pharmacy, SW  Financial Services:  Field seismologist Utilized: Physiological scientist MEDICARE & MEDICAID  Choices offered to/list presented to: PT  Follow-up services arranged:  Home Health, Patient/Family request agency HH/DME Home Health Agency: [facility]  PT  OT  RN   HAS ALL NEEDED EQUIPMENT   HH/DME Requested Agency: REFERRAL MADE WHILE ON ACUTE  Patient response to transportation need: Is the patient able to respond to transportation needs?: Yes In the past 12 months, has lack of transportation kept you from medical appointments or from getting medications?: No In the past 12 months, has lack of transportation kept you from meetings, work, or from getting things needed for daily living?: No   Patient/Family verbalized understanding of follow-up arrangements:  Yes  Individual responsible for coordination of the follow-up plan: SELF AND [name]-WIFE [phone]  Confirmed correct DME delivered: [name] 08/23/2023    Comments (or additional information):PT REACHED HIS GOALS AND FEELS READY TO GO HOME. HAS ALL NEEDED EQUIPMENT AT HOME FROM PREVIOUS ADMISSIONS  Summary of Stay    Date/Time Discharge Planning CSW  08/22/23 0824 HOme with wife who can only provide supervision  level, pt making progress slowly. Will arrange [facility] and DME once have recommendations RGD       [name], [name]

## 2023-08-23 NOTE — Progress Notes (Signed)
Met with patient, discussed discharge date and to have family member here by 0800 so that can get him out before 1100. [name] he was told to have party here by 1000.  Ensured that correct time was 0800 to allow time discharge process.  Discussed diet and wound care with him.  All questions answered and he verbalized an understanding.  Call bell in reach.

## 2023-08-23 NOTE — Progress Notes (Signed)
[facility]  Vascular was called this morning with concern for increased right leg pain.   Objective 125/66 65 98 F (36.7 C) 18 100%  Intake/Output Summary (Last 24 hours) at 08/23/2023 1227 Last data filed at 08/23/2023 1128 Gross per 24 hour  Intake 240 ml  Output 1150 ml  Net -910 ml    Palpable pulse in right femoral to peroneal bypass Right groin and leg incisions healing without issue Brisk triphasic peroneal signal at the right ankle  Laboratory Lab Results: No results for input(s): "WBC", "HGB", "HCT", "PLT" in the last 72 hours. BMET No results for input(s): "NA", "K", "CL", "CO2", "GLUCOSE", "BUN", "CREATININE", "CALCIUM" in the last 72 hours.  COAG Lab Results  Component Value Date   INR 1.2 08/09/2023   INR 1.1 12/05/2020   INR 1.1 04/24/2020   No results found for: "PTT"  Assessment/Planning:  72 year old male status post right common femoral to peroneal artery bypass with ipsilateral reversed great saphenous vein on 08/09/2023.  This was in the setting of critical limb ischemia with tissue loss.  He has been in rehab.  Tells me he is leaving tomorrow to go home.  Vascular surgery called to evaluate him due to concern for increased leg pain.  All of his incisions are healing.  He has a palpable pulse in the bypass graft tunneled through the saphenectomy incision and has a brisk triphasic peroneal signal at the right ankle.  Bypass patent.  He does have tissue loss of his right second and third toes and this has been followed by podiatry.  He tells me he is leaving tomorrow and discussed we can arrange follow-up as an outpatient and I will have him see [place] in 2 to 3 weeks and ensure he has podiatry follow-up as well.  Cephus Shelling 08/23/2023 12:27 PM --

## 2023-08-23 NOTE — Progress Notes (Signed)
Physical Therapy Discharge Summary  Patient Details  Name: [name] MRN: [id number] Date of Birth: [birth year]  Date of Discharge from PT service:August 23, 2023  Today's Date: 08/23/2023 PT Individual Time:1005-1023, 1345-140  Total Time, 18 min, 45 min  Missed Time: 57 ( refusal and pain)       Patient has met 6 of 6 long term goals due to improved activity tolerance, improved balance, and ability to compensate for deficits.  Patient to discharge at a wheelchair level Supervision.    Reasons goals not met: N/A   Recommendation:  Patient will benefit from ongoing skilled PT services in home health setting to continue to advance safe functional mobility, address ongoing impairments in pain management, balance, coordination, ROM, strength, and minimize fall risk.  Equipment: No equipment provided  Reasons for discharge: treatment goals met and discharge from hospital  Patient/family agrees with progress made and goals achieved: Yes  PT Discharge Pain Interference Pain Interference Pain Effect on Sleep: 4. Almost constantly Pain Interference with Therapy Activities: 4. Almost constantly Pain Interference with Day-to-Day Activities: 4. 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 and pain Finger Nose Finger Test: [facility] Heel Shin Test: WFL R LE, UTA L LE 2/2 AKA Motor  Motor Motor: Other (comment) Motor - Skilled Clinical  Observations: L AKA (2022)  Mobility Bed Mobility Bed Mobility: Rolling Right;Rolling Left;Supine to Sit;Sit to Supine;Sitting - Scoot to Edge of Bed Rolling Right: Independent with assistive device Rolling Left: Independent with assistive device Supine to Sit: Independent with assistive device Sitting - Scoot to Edge of Bed: Independent with assistive device Sit to Supine: Independent with assistive device Transfers Transfers: Sit to Stand;Stand to Sit;Squat Pivot Transfers Sit to Stand: Moderate Assistance - Patient 50-74% Stand to Sit: Moderate Assistance - Patient 50-74% Squat Pivot Transfers: Supervision/Verbal cueing Locomotion  Gait Ambulation: No Gait Gait: No Stairs / Additional Locomotion Stairs: No Wheelchair Mobility Wheelchair Mobility: Yes Wheelchair Assistance: Set up Education officer, museum: Both upper extremities Wheelchair Parts Management: Needs assistance Distance: 150 ft  Trunk/Postural Assessment  Cervical Assessment Cervical Assessment: Exceptions to [facility] (forward head) Thoracic Assessment Thoracic Assessment: Exceptions to [facility] (rounded shoulders) Lumbar Assessment Lumbar Assessment: Exceptions to [facility] (posterior pelvic tilt) Postural Control Postural Control: Deficits on evaluation Protective Responses: slightly delayed responses  Balance Balance Balance Assessed: Yes Static Sitting Balance Static Sitting - Balance Support: Bilateral upper extremity supported Static Sitting - Level of Assistance: 6: Modified independent (Device/Increase time) Dynamic Sitting Balance Dynamic Sitting - Balance Support: Bilateral upper extremity supported Dynamic Sitting - Level of Assistance: 6: Modified independent (Device/Increase time) Dynamic Sitting - Balance Activities: Lateral lean/weight shifting;Reaching for objects Extremity Assessment  RLE Assessment RLE Assessment: Exceptions to [facility] General Strength Comments: Grossly 4/5, formal MMT limited due to  pain LLE Assessment General Strength Comments: Grossly 3/5, unable to formally assess due to pain     Treatment Session 1:   Pt received semi-reclined in bed and initially agreeable to PT session with emphasis on UE strength and activity tolerance training due to unrated R LE pain. PT retrieved equipment for work out and pt  stated he was not able to weight bear or get out of bed due to increase in pain that started last night. PT notified nursing, PA and MD. Pt politely refused remaining time of session due to pain and left semi-reclined in bed with all needs in reach.   Treatment Session 2:   Pt received semi-reclined in bed and agreeable to PT. Pt continues to report unrated R LE pain but states he is willing to participate. Pt mod I with bed mobility and supervision with squat pivot to w/c. Pt set-up for w/c propulsion x 150 ft to main gym. On mat table pt performed chest press with 4 # of resistance 1 x 10 followed by modified sit ups with contralateral reaching with 1.5# weights on bilateral UE's to address strength deficits. Pt returned to room dependently for time management and left semi-reclined in bed with all needs in reach.     Truitt Leep Truitt Leep PT, DPT  08/23/2023, 8:58 PM

## 2023-08-23 NOTE — Progress Notes (Signed)
Occupational Therapy Discharge Summary  Patient Details  Name: [name] MRN: [id number] Date of Birth: 05-16-1951  Date of Discharge from OT service:August 23, 2023  Today's Date: 08/23/2023 OT Individual Time: 4098-1191 OT Individual Time Calculation (min): 74 min    Patient has met 8 of 8 long term goals due to improved activity tolerance and improved balance.  Patient to discharge at overall Supervision level.  Patient's care partner is independent to provide the necessary physical assistance at discharge.    Reasons goals not met: N/A  Recommendation:  Patient will benefit from ongoing skilled OT services in home health setting to continue to advance functional skills in the area of BADL and Reduce care partner burden.  Equipment: No equipment provided  Reasons for discharge: treatment goals met and discharge from hospital  Patient/family agrees with progress made and goals achieved: Yes  OT Discharge Precautions/Restrictions  Precautions Precautions: Fall Precaution Comments: L AKA Restrictions Weight Bearing Restrictions: No Pain Pain Assessment Pain Scale: 0-10 Pain Score: 8  Pain Type: Surgical pain Pain Location: Leg Pain Orientation: Right Pain Descriptors / Indicators: Discomfort Pain Frequency: Constant Pain Onset: On-going Pain Intervention(s): Medication (See eMAR) ADL ADL Equipment Provided: Reacher, Sock aid Eating: Set up Where Assessed-Eating: Bed level Grooming: Setup Where Assessed-Grooming: Sitting at sink Upper Body Bathing: Supervision/safety Where Assessed-Upper Body Bathing: Sitting at sink Lower Body Bathing: Supervision/safety Where Assessed-Lower Body Bathing: Sitting at sink Upper Body Dressing: Setup Where Assessed-Upper Body Dressing: Edge of bed Lower Body Dressing: Setup Where Assessed-Lower Body Dressing: Edge of bed Toileting: Supervision/safety Where Assessed-Toileting: Teacher, adult education: Close  supervision Toilet Transfer Method: Ambulance person: Drop arm bedside commode Tub/Shower Transfer: Close supervison Web designer Method: Conservation officer, historic buildings: Insurance underwriter: Close supervision Film/video editor Method: Squat pivot ADL Comments: Increased pain this date limited ADLs. Pt reported already washing up in AM so refused to bathe, simulated bathing instead. Pt completed lateral leaning in bed to manage pants over hips Vision Baseline Vision/History: 0 No visual deficits Patient Visual Report: No change from baseline Vision Assessment?: No apparent visual deficits Perception  Perception: Within Functional Limits Praxis Praxis: WFL Cognition Cognition Overall Cognitive Status: Within Functional Limits for tasks assessed Arousal/Alertness: Awake/alert Orientation Level: Person;Place;Situation Person: Oriented Place: Oriented Situation: Oriented Memory: Appears intact Awareness: Appears intact Problem Solving: Appears intact Safety/Judgment: Appears intact Brief Interview for Mental Status (BIMS) Repetition of Three Words (First Attempt): 3 Temporal Orientation: Year: Correct Temporal Orientation: Month: Accurate within 5 days Temporal Orientation: Day: Correct Recall: "Sock": Yes, no cue required Recall: "Blue": Yes, no cue required Recall: "Bed": Yes, no cue required BIMS Summary Score: 15 Sensation Sensation Light Touch: Impaired by gross assessment (reports numbess/tingling, burning sensation in toes) Hot/Cold: Appears Intact Proprioception: Appears Intact Stereognosis: Not tested Coordination Gross Motor Movements are Fluid and Coordinated: No Fine Motor Movements are Fluid and Coordinated: Yes Coordination and Movement Description: grossly uncoordinated 2/2 L AKA and pain Motor  Motor Motor: Other (comment) Motor - Skilled Clinical Observations: L AKA (2022) Mobility  Bed  Mobility Bed Mobility: Rolling Right;Rolling Left;Supine to Sit;Sit to Supine;Sitting - Scoot to Edge of Bed Rolling Right: Independent with assistive device Rolling Left: Independent with assistive device Supine to Sit: Independent with assistive device Sitting - Scoot to Edge of Bed: Independent with assistive device Sit to Supine: Independent with assistive device Transfers Sit to Stand: Moderate Assistance - Patient 50-74% Stand to Sit: Moderate Assistance - Patient 50-74%  Trunk/Postural Assessment  Cervical Assessment Cervical Assessment: Exceptions to [facility] (forward head) Thoracic Assessment Thoracic Assessment: Exceptions to [facility] (rounded shoulders) Lumbar Assessment Lumbar Assessment: Exceptions to [facility] (posterior pelvic tilt) Postural Control Postural Control: Deficits on evaluation Protective Responses: slightly delayed responses  Balance Balance Balance Assessed: Yes Static Sitting Balance Static Sitting - Balance Support: Bilateral upper extremity supported Static Sitting - Level of Assistance: 5: Stand by assistance Dynamic Sitting Balance Dynamic Sitting - Balance Support: Bilateral upper extremity supported Dynamic Sitting - Level of Assistance: 5: Stand by assistance Dynamic Sitting - Balance Activities: Lateral lean/weight shifting;Reaching for objects Sitting balance - Comments: sitting EOB Extremity/Trunk Assessment RUE Assessment RUE Assessment: Within Functional Limits Active Range of Motion (AROM) Comments: ~100* shoulder flexion d/t prior rotator cuff issues at baseline, all other [facility] General Strength Comments: overall 4-/5 LUE Assessment LUE Assessment: Within Functional Limits Active Range of Motion (AROM) Comments: ~100* shoulder flexion d/t prior rotator cuff issues at baseline, all other [facility] General Strength Comments: overall 4-/5   General: "You just keep coming back." Pt supine in bed upon OT arrival, agreeable to OT session. During session, pt  felt something in sock, realizing toenail of third necrotic toe fall off prior to session while he was putting his sock on, nurse notified and toe examined. No pain reported.  Pain:  7/10 pain reported in RLE and bilateral shoulders, activity, intermittent rest breaks, distractions provided for pain management, pt reports tolerable to proceed.   ADL: Bed mobility: mod I supine ><EOB Footwear: distant supervision with donning/doffing sock  Exercises: Pt completed the following exercise circuit in order to improve functional activity, strength and endurance to prepare for ADLs such as bathing. Pt completed the following exercises in seated position with no noted LOB/SOB and 3x10 repetitions on each exercise: -bicep curls -triceps extensions -shoulder flexion -power grip squeezes with BUE and pink sponge  While pt seated EOB reported feeling "lightheaded". BPs listed below -99/66 after immediately lying down -85/55 after ~1 min recovery -95/66 after ~5 min -106/68, reported feeling less lightheaded after ~8 min recovery Nurse tech notified to tell nurse. Happened at end of session so prolonged recovery after session.    Other Treatments: Pt issued long handled sponge to use for desensitizing techniques on scar from surgery.   Pt supine in bed with bed alarm activated, 2 bed rails up, call light within reach and 4Ps assessed.   Velia Meyer, OTD, OTR/L 08/23/2023, 8:17 AM

## 2023-08-23 NOTE — Progress Notes (Addendum)
Inpatient Rehabilitation Discharge Medication Review by a Pharmacist   A complete drug regimen review was completed for this patient to identify any potential clinically significant medication issues.   High Risk Drug Classes Is patient taking? Indication by Medication  Antipsychotic No    Anticoagulant No    Antibiotic No    Opioid Yes Oxycodone for acute pain   Antiplatelet Yes  Aspirin and clopidogrel for PAD  Hypoglycemics/insulin No    Vasoactive Medication Yes Midodrine for hypotension  Chemotherapy No    Other Yes Acetaminophen for mild pain Albuterol for dyspnea Vitamin C/ Zn for wound healing Atorvastatin/ ezetimibe for HLD Baclofen for muscle spasm Docusate/miralax for stool softener Fluoxetine for depression Gabapentin for nerve pain Melatonin for sleep Metformin for DM Nebivolol for HTN Nicotine for tobacco cessation Pantoprazole for GERD        Type of Medication Issue Identified Description of Issue Recommendation(s)  Drug Interaction(s) (clinically significant)        Duplicate Therapy        Allergy        No Medication Administration End Date   Prolonged zinc supplementation may cause copper deficiency Consider 14d end date on zinc, ok by MD  Incorrect Dose        Additional Drug Therapy Needed        Significant med changes from prior encounter (inform family/care partners about these prior to discharge). STOP  amlodipine, benazepril, percocet NEW vitamin C, zinc, docusate, melatonin, midodrine, miralax, oxycodone  DECREASE nebivolol INCREASE gabapentin  Team to review with pt   Other            Clinically significant medication issues were identified that warrant physician communication and completion of prescribed/recommended actions by midnight of the next day:  Yes   Name of provider notified for urgent issues identified: Dr. Genice Rouge   Provider Method of Notification: Secure chat        Pharmacist comments:  Clinically significant  medication issue  was resolved per above. Orders entered.   Time spent performing this drug regimen review (minutes):  20   Alphia Moh, PharmD, [employer], [facility] Clinical Pharmacist   Please check AMION for all [facility] Pharm[facility] phone numbers After 10:00 PM, call Main Pharmacy [phone]

## 2023-08-23 NOTE — Plan of Care (Signed)
  Problem: RH Eating Goal: LTG Patient will perform eating w/assist, cues/equip (OT) Description: LTG: Patient will perform eating with assist, with/without cues using equipment (OT) Outcome: Completed/Met Flowsheets (Taken 08/17/2023 1243) LTG: Pt will perform eating with assistance level of: Set up assist    Problem: RH Grooming Goal: LTG Patient will perform grooming w/assist,cues/equip (OT) Description: LTG: Patient will perform grooming with assist, with/without cues using equipment (OT) Outcome: Completed/Met Flowsheets (Taken 08/17/2023 1243) LTG: Pt will perform grooming with assistance level of: Set up assist    Problem: RH Bathing Goal: LTG Patient will bathe all body parts with assist levels (OT) Description: LTG: Patient will bathe all body parts with assist levels (OT) Outcome: Completed/Met Flowsheets (Taken 08/17/2023 1243) LTG: Pt will perform bathing with assistance level/cueing: Supervision/Verbal cueing   Problem: RH Dressing Goal: LTG Patient will perform upper body dressing (OT) Description: LTG Patient will perform upper body dressing with assist, with/without cues (OT). Outcome: Completed/Met Flowsheets (Taken 08/17/2023 1243) LTG: Pt will perform upper body dressing with assistance level of: Set up assist Goal: LTG Patient will perform lower body dressing w/assist (OT) Description: LTG: Patient will perform lower body dressing with assist, with/without cues in positioning using equipment (OT) Outcome: Completed/Met Flowsheets (Taken 08/17/2023 1243) LTG: Pt will perform lower body dressing with assistance level of: Supervision/Verbal cueing   Problem: RH Toileting Goal: LTG Patient will perform toileting task (3/3 steps) with assistance level (OT) Description: LTG: Patient will perform toileting task (3/3 steps) with assistance level (OT)  Outcome: Completed/Met Flowsheets (Taken 08/17/2023 1243) LTG: Pt will perform toileting task (3/3 steps) with assistance  level: Supervision/Verbal cueing   Problem: RH Toilet Transfers Goal: LTG Patient will perform toilet transfers w/assist (OT) Description: LTG: Patient will perform toilet transfers with assist, with/without cues using equipment (OT) Outcome: Completed/Met Flowsheets (Taken 08/17/2023 1243) LTG: Pt will perform toilet transfers with assistance level of: Supervision/Verbal cueing   Problem: RH Tub/Shower Transfers Goal: LTG Patient will perform tub/shower transfers w/assist (OT) Description: LTG: Patient will perform tub/shower transfers with assist, with/without cues using equipment (OT) Outcome: Completed/Met Flowsheets (Taken 08/17/2023 1243) LTG: Pt will perform tub/shower stall transfers with assistance level of: Supervision/Verbal cueing

## 2023-08-24 DIAGNOSIS — I739 Peripheral vascular disease, unspecified: Secondary | ICD-10-CM | POA: Diagnosis not present

## 2023-08-24 LAB — GLUCOSE, CAPILLARY: Glucose-Capillary: 99 mg/dL (ref 70–99)

## 2023-08-24 NOTE — Progress Notes (Signed)
PROGRESS NOTE   Subjective/Complaints:  Pt reports doing well- denies a lot of dizziness.   Spasms better since I added Baclofen scheduled.  Explained will be on meds for 7 days for pain meds- and then need to get refills from PCP/surgeon.   He thinks increase in gabapentin helpful as well.   ROS:    Pt denies SOB, abd pain, CP, N/V/C/D, and vision changes  Except for HPI  Objective:   No results found. No results for input(s): "WBC", "HGB", "HCT", "PLT" in the last 72 hours.  No results for input(s): "NA", "K", "CL", "CO2", "GLUCOSE", "BUN", "CREATININE", "CALCIUM" in the last 72 hours.   Intake/Output Summary (Last 24 hours) at 08/24/2023 0857 Last data filed at 08/24/2023 0400 Gross per 24 hour  Intake 716 ml  Output 575 ml  Net 141 ml     Pressure Injury 08/16/23 Buttocks Left Stage 2 -  Partial thickness loss of dermis presenting as a shallow open injury with a red, pink wound bed without slough. Small stage 2 just inside left buttock (Active)  08/16/23 1720  Location: Buttocks  Location Orientation: Left  Staging: Stage 2 -  Partial thickness loss of dermis presenting as a shallow open injury with a red, pink wound bed without slough.  Wound Description (Comments): Small stage 2 just inside left buttock  Present on Admission: Yes    Physical Exam: Vital Signs Blood pressure (!) 88/71, pulse 68, temperature 98.6 F (37 C), resp. rate 16, height 5\' 7"  (1.702 m), weight 82.6 kg, SpO2 99%.         General: awake, alert, appropriate, sitting EOB eating- tray almost empty;  NAD HENT: conjugate gaze; oropharynx moist CV: regular rate and rhythm; no JVD Pulmonary: CTA B/L; no W/R/R- good air movement GI: soft, NT, ND, (+)BS- normoactive Psychiatric: appropriate- interactive- thankful for care here Neurological: Ox3  Saw pt jerking from painful muscle spasms this AM- seen- occurs frequently- ~ q 1-3  minutes- improved today- actually didn't see this AM Ext: no clubbing, cyanosis, or edema Psych: pleasant and cooperative  Neurological: Ox3 MS: L AKA- well healed- has prosthesis at his side Skin: healing/glued 3 incision on R interior groin/thigh and calf- from bypass surgery- unchanged. Buttock wound with foam dressing- leg looking good- R toes- dry gangrene-   Assessment/Plan: 1. Functional deficits which require 3+ hours per day of interdisciplinary therapy in a comprehensive inpatient rehab setting. Physiatrist is providing close team supervision and 24 hour management of active medical problems listed below. Physiatrist and rehab team continue to assess barriers to discharge/monitor patient progress toward functional and medical goals  Care Tool:  Bathing    Body parts bathed by patient: Right arm, Left arm, Chest, Abdomen, Front perineal area, Right upper leg, Left upper leg, Buttocks, Right lower leg, Face   Body parts bathed by helper: Buttocks, Right lower leg Body parts n/a: Left lower leg (L AKA)   Bathing assist Assist Level: Supervision/Verbal cueing     Upper Body Dressing/Undressing Upper body dressing   What is the patient wearing?: Pull over shirt    Upper body assist Assist Level: Set up assist    Lower Body  Dressing/Undressing Lower body dressing            Lower body assist Assist for lower body dressing: Set up assist     Toileting Toileting    Toileting assist Assist for toileting: Supervision/Verbal cueing     Transfers Chair/bed transfer  Transfers assist     Chair/bed transfer assist level: Supervision/Verbal cueing     Locomotion Ambulation   Ambulation assist   Ambulation activity did not occur: Safety/medical concerns (fatigue, pain)          Walk 10 feet activity   Assist  Walk 10 feet activity did not occur: Safety/medical concerns        Walk 50 feet activity   Assist Walk 50 feet with 2 turns activity did  not occur: Safety/medical concerns         Walk 150 feet activity   Assist Walk 150 feet activity did not occur: Safety/medical concerns         Walk 10 feet on uneven surface  activity   Assist Walk 10 feet on uneven surfaces activity did not occur: Safety/medical concerns         Wheelchair     Assist Is the patient using a wheelchair?: Yes Type of Wheelchair: Manual    Wheelchair assist level: Supervision/Verbal cueing Max wheelchair distance: 75 ft    Wheelchair 50 feet with 2 turns activity    Assist        Assist Level: Supervision/Verbal cueing   Wheelchair 150 feet activity     Assist      Assist Level: Maximal Assistance - Patient 25 - 49%   Blood pressure (!) 88/71, pulse 68, temperature 98.6 F (37 C), resp. rate 16, height 5\' 7"  (1.702 m), weight 82.6 kg, SpO2 99%.   Medical Problem List and Plan: 1. Functional deficits secondary to debility related to PAD status post right common femoral to peroneal bypass for critical limb ischemia with tissue loss 08/09/2023 per Dr. Chestine Spore as well as history of left AKA 2022.             -patient may shower but incisions must be covered             -ELOS/Goals: modI 7-10 days         D/c 8/29  D/c today  Went over d/c plans and orders on pain meds- will need to get refills from PCP or surgeon  Midodrine is needed for now- will need to get from PCP if still needs after 30 days.   Doesn't need f/u with me.  2.  Antithrombotics: -DVT/anticoagulation:  Pharmaceutical: Heparin 8/22- will change to Lovenox since has been 4+ days since surgery and Cr/BUN allow it- less injections             -antiplatelet therapy: Aspirin 81 mg daily and Plavix 75 mg daily 3. Pain Management: Neurontin 300 mg 3 times daily, baclofen 5 mg twice daily as needed, oxycodone as needed  8/22- pt reports bed really bothering back/buttocks- will get air bed  -pain reports just got pain meds-taking 2 tabs usually- and wil  monitor if needs to increase since rating pain 6/10 this AM, before meds kicked in.   8/24 pt reported to me that pain was adequately controlled-   8/25- pt reports pain 8/10 this AM, from standing- doesn't want to change meds.   8/27- asking for increasing pain meds- will change Percocet to Oxy 10-15 mg q4 hours- didn't take anything but tylenol at  home.   8/28- pain 8/10 before meds this AM- will increase baclofen to 5 mg TID due to spasms and increase gabapentin to 600 mg BID- to help nerve pain- will need to get pain meds from surgeon after d/c. Doesn't need to see me, but will give 7 days of pain meds after d/c 4. Mood/Behavior/Sleep: Prozac 10 mg daily.  Provide emotional support             -antipsychotic agents: N/A 5. Neuropsych/cognition: This patient is capable of making decisions on his own behalf. 6. Skin/Wound Care: Routine skin checks  8/22- R thigh/groin and Claf look good- L AKA healed-   8/24-25 pressure relief, dressing to buttock area 7. Fluids/Electrolytes/Nutrition: Routine in and outs with follow-up chemistries 8.  Acute on chronic anemia.  Follow-up CBC  8/22- Hb 9.4 stable to increasing- will monitor weekly.  9.  Diabetes mellitus with peripheral neuropathy.  Hemoglobin A1c 6.4.  Glucophage 1000 mg twice daily.  8/22- Cbgs running 104-117- doing great- will con't regimen  8/23- CBG's 99-135- con't regimen  8/26- CBG's running 89-123- con't regimen  8/28-8/29 CBGs running 100's ot 110- great- con't regimen 10.  Hyperlipidemia.  Lipitor/Zetia 11.  Hypertension with hypotension.  Reduce Bystolic to 10mg  daily given hypotension. Monitor with increased mobility  8/22- BP's a little soft- in 110s-110s- will monitor with therapy if has orthostaitc hypotension  8/23- BP's running 87/52 up to 126/66- don't feel comofrtable stopping Bystolic- will add Midodrine 2.5 mg TID and monitor for orthostatic hypotension more. Will order Orthostatics daily x 5 days  8/24 I don't see any  orthostatics. Will order  8/26- Orthostatics done- look good this AM- con't regimen  8/27- increased midodrine to 5 mg TID yesterday- will see if better- if not, will increase midodrine.   8/28- per therapy, BP doing better on Midodrine.   8/29- send home on Midodrine will need to get refills from PCP and see if possible ot wean off it if they can,  12.  GERD.  Continue Protonix 13.  Obesity.  BMI 29.66.  Dietary follow-up  8/22- Down to 28.52 14.  Tobacco abuse.  Nicorette gum as needed.  Provide counseling 15.  Constipation.  Continue MiraLAX daily, Colace 100 mg daily  8/22- Pt reports LBM yesterday- denies constipation.   LBM 8/22  8/25- LBM yesterday  8/27- LBM yesterday  8/29- going daily per pt 16. Insomnia  8/22- will add Melatonin 6 mg at bedtime.   8/24 sleeping better esp with air bed.  17. Dry gangrene of R foot  8/27- will add Vit C, Zinc, and betadine of R foot/toes due to gangrene and Ensure max so doesn't affect BG's as much.      I spent a total of 32   minutes on total care today- >50% coordination of care- due to  D/w pt about d/c plans- will need to get pain meds from PCP or surgeon after 7 days- will get 7 days of meds.     LOS: 8 days A FACE TO FACE EVALUATION WAS PERFORMED  Tel Hevia 08/24/2023, 8:57 AM

## 2023-08-25 DIAGNOSIS — I10 Essential (primary) hypertension: Secondary | ICD-10-CM | POA: Diagnosis not present

## 2023-08-25 DIAGNOSIS — F17210 Nicotine dependence, cigarettes, uncomplicated: Secondary | ICD-10-CM | POA: Diagnosis not present

## 2023-08-25 DIAGNOSIS — Z48812 Encounter for surgical aftercare following surgery on the circulatory system: Secondary | ICD-10-CM | POA: Diagnosis not present

## 2023-08-25 DIAGNOSIS — G8929 Other chronic pain: Secondary | ICD-10-CM | POA: Diagnosis not present

## 2023-08-25 DIAGNOSIS — K219 Gastro-esophageal reflux disease without esophagitis: Secondary | ICD-10-CM | POA: Diagnosis not present

## 2023-08-25 DIAGNOSIS — I70221 Atherosclerosis of native arteries of extremities with rest pain, right leg: Secondary | ICD-10-CM | POA: Diagnosis not present

## 2023-08-25 DIAGNOSIS — Z89612 Acquired absence of left leg above knee: Secondary | ICD-10-CM | POA: Diagnosis not present

## 2023-08-25 DIAGNOSIS — E669 Obesity, unspecified: Secondary | ICD-10-CM | POA: Diagnosis not present

## 2023-08-25 DIAGNOSIS — E1142 Type 2 diabetes mellitus with diabetic polyneuropathy: Secondary | ICD-10-CM | POA: Diagnosis not present

## 2023-08-25 DIAGNOSIS — E1151 Type 2 diabetes mellitus with diabetic peripheral angiopathy without gangrene: Secondary | ICD-10-CM | POA: Diagnosis not present

## 2023-08-25 DIAGNOSIS — E785 Hyperlipidemia, unspecified: Secondary | ICD-10-CM | POA: Diagnosis not present

## 2023-08-25 DIAGNOSIS — D649 Anemia, unspecified: Secondary | ICD-10-CM | POA: Diagnosis not present

## 2023-08-25 DIAGNOSIS — Z6829 Body mass index (BMI) 29.0-29.9, adult: Secondary | ICD-10-CM | POA: Diagnosis not present

## 2023-08-28 ENCOUNTER — Other Ambulatory Visit: Payer: Self-pay | Admitting: Student

## 2023-08-28 DIAGNOSIS — K219 Gastro-esophageal reflux disease without esophagitis: Secondary | ICD-10-CM

## 2023-08-28 DIAGNOSIS — E7800 Pure hypercholesterolemia, unspecified: Secondary | ICD-10-CM

## 2023-08-28 DIAGNOSIS — I10 Essential (primary) hypertension: Secondary | ICD-10-CM

## 2023-08-31 ENCOUNTER — Ambulatory Visit (INDEPENDENT_AMBULATORY_CARE_PROVIDER_SITE_OTHER): Payer: Medicare HMO | Admitting: Podiatry

## 2023-08-31 DIAGNOSIS — Z91199 Patient's noncompliance with other medical treatment and regimen due to unspecified reason: Secondary | ICD-10-CM

## 2023-08-31 NOTE — Progress Notes (Signed)
 Patient absent for apointment

## 2023-09-04 ENCOUNTER — Ambulatory Visit (INDEPENDENT_AMBULATORY_CARE_PROVIDER_SITE_OTHER): Payer: Medicare HMO | Admitting: Podiatry

## 2023-09-04 DIAGNOSIS — Z91199 Patient's noncompliance with other medical treatment and regimen due to unspecified reason: Secondary | ICD-10-CM

## 2023-09-04 NOTE — Progress Notes (Signed)
No show

## 2023-09-05 ENCOUNTER — Encounter: Payer: Self-pay | Admitting: Podiatry

## 2023-09-05 ENCOUNTER — Ambulatory Visit (INDEPENDENT_AMBULATORY_CARE_PROVIDER_SITE_OTHER): Payer: Medicare HMO | Admitting: Podiatry

## 2023-09-05 DIAGNOSIS — I96 Gangrene, not elsewhere classified: Secondary | ICD-10-CM

## 2023-09-05 DIAGNOSIS — E1151 Type 2 diabetes mellitus with diabetic peripheral angiopathy without gangrene: Secondary | ICD-10-CM

## 2023-09-05 DIAGNOSIS — I739 Peripheral vascular disease, unspecified: Secondary | ICD-10-CM

## 2023-09-05 MED ORDER — OXYCODONE-ACETAMINOPHEN 5-325 MG PO TABS
1.0000 | ORAL_TABLET | ORAL | 0 refills | Status: AC | PRN
Start: 2023-09-05 — End: 2023-09-12

## 2023-09-05 NOTE — Progress Notes (Deleted)
POST OPERATIVE OFFICE NOTE    CC:  F/u for surgery  HPI:  This is a 72 y.o. male who is s/p Redo exposure of right CFA with right CFA to peroneal artery bypass with ipsilateral reversed GSV on 08/09/2023 by Dr. Chestine Spore for CLI with tissue loss.   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 Lenell Antu. He has history  of right EIA stenting in 2018 by Dr. Edilia Bo and right femoral endarterectomy in April of 2021 by Dr. Chestine Spore.   Pt returns today for follow up.  Pt states ***   Allergies  Allergen Reactions   Glipizide Other (See Comments)    REACTION IS SIDE EFFECT Severe hypoglycemia to 40s.     Current Outpatient Medications  Medication Sig Dispense Refill   acetaminophen (TYLENOL) 325 MG tablet Take 1-2 tablets (325-650 mg total) by mouth every 4 (four) hours as needed for mild pain (or temp >/= 101 F).     albuterol (VENTOLIN HFA) 108 (90 Base) MCG/ACT inhaler Inhale 1-2 puffs into the lungs every 6 (six) hours as needed for wheezing or shortness of breath. 18 g 1   ascorbic acid (VITAMIN C) 500 MG tablet Take 1 tablet (500 mg total) by mouth 2 (two) times daily. 100 tablet 0   aspirin EC 81 MG tablet Take 1 tablet (81 mg total) by mouth daily. Swallow whole.     atorvastatin (LIPITOR) 40 MG tablet Take 1 tablet (40 mg total) by mouth daily. 30 tablet 0   Baclofen 5 MG TABS Take 1 tablet (5 mg total) by mouth 3 (three) times daily. 90 each 0   clopidogrel (PLAVIX) 75 MG tablet Take 1 tablet (75 mg total) by mouth daily. 30 tablet 0   docusate sodium (COLACE) 100 MG capsule Take 1 capsule (100 mg total) by mouth daily.     ezetimibe (ZETIA) 10 MG tablet Take 1 tablet (10 mg total) by mouth daily. 30 tablet 0   FLUoxetine (PROZAC) 10 MG capsule Take 1 capsule (10 mg total) by mouth daily. 30 capsule 0   gabapentin (NEURONTIN) 300 MG capsule Take 2 capsules (600 mg total) by mouth 2 (two) times daily. 120 capsule 0   melatonin 3 MG TABS tablet Take 2 tablets (6 mg total) by mouth at  bedtime. 60 tablet 0   metFORMIN (GLUCOPHAGE) 1000 MG tablet Take 1 tablet (1,000 mg total) by mouth 2 (two) times daily with a meal. 60 tablet 0   midodrine (PROAMATINE) 5 MG tablet Take 1 tablet (5 mg total) by mouth 3 (three) times daily with meals. 90 tablet 0   nebivolol (BYSTOLIC) 10 MG tablet Take 1 tablet (10 mg total) by mouth daily. 30 tablet 0   nicotine polacrilex (NICORETTE) 2 MG gum Take 1 each (2 mg total) by mouth as needed for smoking cessation. 100 tablet 3   Oxycodone HCl 10 MG TABS Take 1 tablet (10 mg total) by mouth every 4 (four) hours as needed for severe pain. 30 tablet 0   pantoprazole (PROTONIX) 40 MG tablet Take 1 tablet (40 mg total) by mouth daily. 30 tablet 0   polyethylene glycol powder (GLYCOLAX/MIRALAX) 17 GM/SCOOP powder Take 17 g by mouth daily. (Patient not taking: Reported on 08/08/2023) 500 g 0   Zinc Sulfate 220 (50 Zn) MG TABS Take 1 tablet (220 mg total) by mouth daily. 14 tablet 0   No current facility-administered medications for this visit.     ROS:  See  HPI  Physical Exam:  ***  Incision:  *** Extremities:  *** Neuro: *** Abdomen:  ***    Assessment/Plan:  This is a 72 y.o. male who is s/p: Redo exposure of right CFA with right CFA to peroneal artery bypass with ipsilateral reversed GSV on 08/09/2023 by Dr. Chestine Spore for CLI with tissue loss.   -***   Doreatha Massed, [facility] [phone]   Clinic MD:  Chestine Spore

## 2023-09-05 NOTE — Progress Notes (Signed)
Subjective:  Patient ID: [name], male    DOB: 12/25/1951,   MRN: [id number]  No chief complaint on file.   72 y.o. male presents for concern of right foot and leg pain. Patient last seen by Dr. Annamary Rummage 07/21/23.  He has missed a couple appointments since discharge from hospital. He underwent angiogram and bypass with vascular and bypass sounds to have been effective. He was advised to follow-up for wound care vs possible amputation. Relates he has continued to have pain in his leg. Does relate the toes have not been worsening but his pain has in leg. Of note he did have worsening pain prior to leaving hospital and was evaluated by vascular and graft was found to be patent.   . Denies any other pedal complaints. Denies n/v/f/c.   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   Critical lower limb ischemia (HCC) 12/19/2019   Diabetes mellitus    takes Metformin daily   GERD (gastroesophageal reflux disease)    takes Protonix daily   History of blood transfusion    "related to low HgB" ((09/10/2015   Hyperlipidemia    takes Vytorin daily   Hypertension    takes Benazepril and Bystolic daily   PAD (peripheral artery disease) (HCC)    Pneumonia    Radiculopathy, cervical region 02/11/2017   Shortness of breath dyspnea    with exertion   Tobacco user    Toe fracture, right 05/09/2011   Wears glasses     Objective:  Physical Exam: Vascular: DP/PT pulses non palpable CFT <3 seconds. Normal hair growth on digits. No edema.  Skin. No lacerations or abrasions bilateral feet. Right second and third digits with duskiness noted to the toes and necrosis overlying. Scabbing noted over proximal  digits with underlying wounds and fibrinous bases that have potential for healing.  Musculoskeletal: MMT 5/5 bilateral lower extremities in DF, PF, Inversion and Eversion. Deceased ROM in DF of ankle joint.  Neurological: Sensation intact to light touch.   Assessment:   1. Gangrene of toe of right foot (HCC) [I96]   2. PAD (peripheral artery disease) (HCC) [I73.9]   3. 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 Necrotic wounds noted to right second and third digits  -Minimal debridement today.  -No signs of infection noted in the toes. Discussed amputation of the digits vs continued conservative treatment of the toes. Discussed microvascular disease and potential for healing. Discussed at this time continuing with conservative care as patient refuses amputation.  -Dressed with betadine, DSD. -Off-loading with surgical shoe. -No abx indicated.  -Discussed glucose control and proper protein-rich diet.  -Patient requesting pain medication and advised will be able to send in one round of medication at this time but that will be it.  -Discussed if any worsening redness, pain, fever or chills to call or may need to report to the emergency room. Patient expressed understanding.   Return in 2 weeks for recheck.     Return in about 2 weeks (around 09/19/2023) for wound check.  Louann Sjogren, DPM

## 2023-09-14 ENCOUNTER — Other Ambulatory Visit (HOSPITAL_COMMUNITY): Payer: Self-pay

## 2023-09-14 ENCOUNTER — Other Ambulatory Visit: Payer: Self-pay | Admitting: Student

## 2023-09-14 ENCOUNTER — Telehealth: Payer: Self-pay

## 2023-09-14 DIAGNOSIS — R0609 Other forms of dyspnea: Secondary | ICD-10-CM

## 2023-09-14 NOTE — Telephone Encounter (Signed)
Pt called stating that he has 11 days until his next appt and he is out of pain meds.  Reviewed pt's chart, returned call for clarification, two identifiers used. Informed pt that his blood flow issues had been resolved with his latest surgery. At this point, he would either need to be seen again, call podiatry, or get referral to pain management. He had just seen podiatry on 9/10 with a pain med refill and no more after that. Pt was discouraged and hung up.

## 2023-09-21 ENCOUNTER — Encounter (HOSPITAL_COMMUNITY): Payer: Self-pay

## 2023-09-21 ENCOUNTER — Inpatient Hospital Stay (HOSPITAL_COMMUNITY)
Admission: EM | Admit: 2023-09-21 | Discharge: 2023-10-08 | DRG: 239 | Disposition: A | Discharge status: Skilled Nursing Facility | Payer: Medicare HMO | Source: Ambulatory Visit | Attending: Family Medicine | Admitting: Family Medicine

## 2023-09-21 ENCOUNTER — Emergency Department (HOSPITAL_COMMUNITY): Payer: Medicare HMO

## 2023-09-21 ENCOUNTER — Other Ambulatory Visit: Payer: Self-pay

## 2023-09-21 ENCOUNTER — Ambulatory Visit (INDEPENDENT_AMBULATORY_CARE_PROVIDER_SITE_OTHER): Payer: Medicare HMO | Admitting: Podiatry

## 2023-09-21 DIAGNOSIS — K5900 Constipation, unspecified: Secondary | ICD-10-CM | POA: Diagnosis not present

## 2023-09-21 DIAGNOSIS — I469 Cardiac arrest, cause unspecified: Secondary | ICD-10-CM | POA: Diagnosis not present

## 2023-09-21 DIAGNOSIS — M545 Low back pain, unspecified: Secondary | ICD-10-CM | POA: Diagnosis present

## 2023-09-21 DIAGNOSIS — Z89611 Acquired absence of right leg above knee: Secondary | ICD-10-CM

## 2023-09-21 DIAGNOSIS — I7779 Dissection of other artery: Secondary | ICD-10-CM | POA: Diagnosis not present

## 2023-09-21 DIAGNOSIS — F17200 Nicotine dependence, unspecified, uncomplicated: Secondary | ICD-10-CM

## 2023-09-21 DIAGNOSIS — E1143 Type 2 diabetes mellitus with diabetic autonomic (poly)neuropathy: Secondary | ICD-10-CM | POA: Diagnosis not present

## 2023-09-21 DIAGNOSIS — T82392A Other mechanical complication of femoral arterial graft (bypass), initial encounter: Principal | ICD-10-CM | POA: Diagnosis present

## 2023-09-21 DIAGNOSIS — Z89612 Acquired absence of left leg above knee: Secondary | ICD-10-CM

## 2023-09-21 DIAGNOSIS — D509 Iron deficiency anemia, unspecified: Secondary | ICD-10-CM | POA: Diagnosis present

## 2023-09-21 DIAGNOSIS — E11621 Type 2 diabetes mellitus with foot ulcer: Secondary | ICD-10-CM | POA: Diagnosis present

## 2023-09-21 DIAGNOSIS — Z888 Allergy status to other drugs, medicaments and biological substances status: Secondary | ICD-10-CM

## 2023-09-21 DIAGNOSIS — E785 Hyperlipidemia, unspecified: Secondary | ICD-10-CM | POA: Diagnosis present

## 2023-09-21 DIAGNOSIS — L7632 Postprocedural hematoma of skin and subcutaneous tissue following other procedure: Secondary | ICD-10-CM | POA: Diagnosis not present

## 2023-09-21 DIAGNOSIS — D62 Acute posthemorrhagic anemia: Secondary | ICD-10-CM | POA: Diagnosis not present

## 2023-09-21 DIAGNOSIS — L97516 Non-pressure chronic ulcer of other part of right foot with bone involvement without evidence of necrosis: Secondary | ICD-10-CM | POA: Diagnosis present

## 2023-09-21 DIAGNOSIS — R279 Unspecified lack of coordination: Secondary | ICD-10-CM | POA: Diagnosis not present

## 2023-09-21 DIAGNOSIS — I959 Hypotension, unspecified: Secondary | ICD-10-CM | POA: Diagnosis present

## 2023-09-21 DIAGNOSIS — M86171 Other acute osteomyelitis, right ankle and foot: Secondary | ICD-10-CM | POA: Diagnosis present

## 2023-09-21 DIAGNOSIS — R1310 Dysphagia, unspecified: Secondary | ICD-10-CM | POA: Diagnosis present

## 2023-09-21 DIAGNOSIS — Z751 Person awaiting admission to adequate facility elsewhere: Secondary | ICD-10-CM

## 2023-09-21 DIAGNOSIS — I70209 Unspecified atherosclerosis of native arteries of extremities, unspecified extremity: Secondary | ICD-10-CM | POA: Diagnosis not present

## 2023-09-21 DIAGNOSIS — F17290 Nicotine dependence, other tobacco product, uncomplicated: Secondary | ICD-10-CM | POA: Diagnosis not present

## 2023-09-21 DIAGNOSIS — I96 Gangrene, not elsewhere classified: Secondary | ICD-10-CM

## 2023-09-21 DIAGNOSIS — M869 Osteomyelitis, unspecified: Secondary | ICD-10-CM | POA: Diagnosis not present

## 2023-09-21 DIAGNOSIS — E1152 Type 2 diabetes mellitus with diabetic peripheral angiopathy with gangrene: Secondary | ICD-10-CM | POA: Diagnosis not present

## 2023-09-21 DIAGNOSIS — I771 Stricture of artery: Secondary | ICD-10-CM | POA: Diagnosis present

## 2023-09-21 DIAGNOSIS — I70261 Atherosclerosis of native arteries of extremities with gangrene, right leg: Secondary | ICD-10-CM | POA: Diagnosis present

## 2023-09-21 DIAGNOSIS — Y838 Other surgical procedures as the cause of abnormal reaction of the patient, or of later complication, without mention of misadventure at the time of the procedure: Secondary | ICD-10-CM | POA: Diagnosis present

## 2023-09-21 DIAGNOSIS — E1142 Type 2 diabetes mellitus with diabetic polyneuropathy: Secondary | ICD-10-CM | POA: Diagnosis not present

## 2023-09-21 DIAGNOSIS — F32A Depression, unspecified: Secondary | ICD-10-CM | POA: Diagnosis not present

## 2023-09-21 DIAGNOSIS — E1151 Type 2 diabetes mellitus with diabetic peripheral angiopathy without gangrene: Secondary | ICD-10-CM

## 2023-09-21 DIAGNOSIS — R5383 Other fatigue: Secondary | ICD-10-CM | POA: Diagnosis not present

## 2023-09-21 DIAGNOSIS — Z8249 Family history of ischemic heart disease and other diseases of the circulatory system: Secondary | ICD-10-CM

## 2023-09-21 DIAGNOSIS — I70221 Atherosclerosis of native arteries of extremities with rest pain, right leg: Secondary | ICD-10-CM | POA: Diagnosis not present

## 2023-09-21 DIAGNOSIS — Z7902 Long term (current) use of antithrombotics/antiplatelets: Secondary | ICD-10-CM

## 2023-09-21 DIAGNOSIS — M129 Arthropathy, unspecified: Secondary | ICD-10-CM | POA: Diagnosis not present

## 2023-09-21 DIAGNOSIS — E1165 Type 2 diabetes mellitus with hyperglycemia: Secondary | ICD-10-CM | POA: Diagnosis present

## 2023-09-21 DIAGNOSIS — M79671 Pain in right foot: Secondary | ICD-10-CM | POA: Diagnosis not present

## 2023-09-21 DIAGNOSIS — G8929 Other chronic pain: Secondary | ICD-10-CM | POA: Diagnosis present

## 2023-09-21 DIAGNOSIS — I745 Embolism and thrombosis of iliac artery: Secondary | ICD-10-CM | POA: Diagnosis not present

## 2023-09-21 DIAGNOSIS — M6281 Muscle weakness (generalized): Secondary | ICD-10-CM | POA: Diagnosis not present

## 2023-09-21 DIAGNOSIS — Z7984 Long term (current) use of oral hypoglycemic drugs: Secondary | ICD-10-CM

## 2023-09-21 DIAGNOSIS — M19071 Primary osteoarthritis, right ankle and foot: Secondary | ICD-10-CM | POA: Diagnosis not present

## 2023-09-21 DIAGNOSIS — Z833 Family history of diabetes mellitus: Secondary | ICD-10-CM

## 2023-09-21 DIAGNOSIS — I10 Essential (primary) hypertension: Secondary | ICD-10-CM | POA: Diagnosis present

## 2023-09-21 DIAGNOSIS — Y718 Miscellaneous cardiovascular devices associated with adverse incidents, not elsewhere classified: Secondary | ICD-10-CM | POA: Diagnosis not present

## 2023-09-21 DIAGNOSIS — L03115 Cellulitis of right lower limb: Secondary | ICD-10-CM

## 2023-09-21 DIAGNOSIS — R5381 Other malaise: Secondary | ICD-10-CM | POA: Diagnosis not present

## 2023-09-21 DIAGNOSIS — R54 Age-related physical debility: Secondary | ICD-10-CM | POA: Diagnosis present

## 2023-09-21 DIAGNOSIS — E1169 Type 2 diabetes mellitus with other specified complication: Secondary | ICD-10-CM | POA: Diagnosis present

## 2023-09-21 DIAGNOSIS — G4700 Insomnia, unspecified: Secondary | ICD-10-CM | POA: Diagnosis not present

## 2023-09-21 DIAGNOSIS — Z83438 Family history of other disorder of lipoprotein metabolism and other lipidemia: Secondary | ICD-10-CM

## 2023-09-21 DIAGNOSIS — Z5941 Food insecurity: Secondary | ICD-10-CM

## 2023-09-21 DIAGNOSIS — Z800 Family history of malignant neoplasm of digestive organs: Secondary | ICD-10-CM

## 2023-09-21 DIAGNOSIS — K219 Gastro-esophageal reflux disease without esophagitis: Secondary | ICD-10-CM | POA: Diagnosis present

## 2023-09-21 DIAGNOSIS — Z7982 Long term (current) use of aspirin: Secondary | ICD-10-CM

## 2023-09-21 DIAGNOSIS — Z981 Arthrodesis status: Secondary | ICD-10-CM

## 2023-09-21 DIAGNOSIS — E872 Acidosis, unspecified: Secondary | ICD-10-CM | POA: Diagnosis not present

## 2023-09-21 DIAGNOSIS — Z79899 Other long term (current) drug therapy: Secondary | ICD-10-CM

## 2023-09-21 DIAGNOSIS — E1159 Type 2 diabetes mellitus with other circulatory complications: Secondary | ICD-10-CM | POA: Diagnosis not present

## 2023-09-21 DIAGNOSIS — R601 Generalized edema: Secondary | ICD-10-CM | POA: Diagnosis not present

## 2023-09-21 DIAGNOSIS — K760 Fatty (change of) liver, not elsewhere classified: Secondary | ICD-10-CM | POA: Diagnosis not present

## 2023-09-21 DIAGNOSIS — R600 Localized edema: Secondary | ICD-10-CM | POA: Diagnosis not present

## 2023-09-21 DIAGNOSIS — D649 Anemia, unspecified: Secondary | ICD-10-CM | POA: Diagnosis present

## 2023-09-21 DIAGNOSIS — Z7401 Bed confinement status: Secondary | ICD-10-CM | POA: Diagnosis not present

## 2023-09-21 DIAGNOSIS — Z741 Need for assistance with personal care: Secondary | ICD-10-CM | POA: Diagnosis not present

## 2023-09-21 DIAGNOSIS — I998 Other disorder of circulatory system: Secondary | ICD-10-CM | POA: Diagnosis not present

## 2023-09-21 LAB — BASIC METABOLIC PANEL
Anion gap: 15 (ref 5–15)
BUN: 8 mg/dL (ref 8–23)
CO2: 22 mmol/L (ref 22–32)
Calcium: 9 mg/dL (ref 8.9–10.3)
Chloride: 101 mmol/L (ref 98–111)
Creatinine, Ser: 0.74 mg/dL (ref 0.61–1.24)
GFR, Estimated: >60 mL/min (ref >60)
Glucose, Bld: 167 mg/dL — ABNORMAL HIGH (ref 70–99)
Potassium: 3.6 mmol/L (ref 3.5–5.1)
Sodium: 138 mmol/L (ref 135–145)

## 2023-09-21 LAB — CBC WITH DIFFERENTIAL/PLATELET
Abs Immature Granulocytes: 0.05 10*3/uL (ref 0.00–0.07)
Basophils Absolute: 0 10*3/uL (ref 0.0–0.1)
Basophils Relative: 0 %
Eosinophils Absolute: 0.2 10*3/uL (ref 0.0–0.5)
Eosinophils Relative: 2 %
HCT: 37 % — ABNORMAL LOW (ref 39.0–52.0)
Hemoglobin: 10.8 g/dL — ABNORMAL LOW (ref 13.0–17.0)
Immature Granulocytes: 1 %
Lymphocytes Relative: 16 %
Lymphs Abs: 1.5 10*3/uL (ref 0.7–4.0)
MCH: 20.8 pg — ABNORMAL LOW (ref 26.0–34.0)
MCHC: 29.2 g/dL — ABNORMAL LOW (ref 30.0–36.0)
MCV: 71.2 fL — ABNORMAL LOW (ref 80.0–100.0)
Monocytes Absolute: 0.7 10*3/uL (ref 0.1–1.0)
Monocytes Relative: 7 %
Neutro Abs: 6.7 10*3/uL (ref 1.7–7.7)
Neutrophils Relative %: 74 %
Platelets: 378 10*3/uL (ref 150–400)
RBC: 5.2 MIL/uL (ref 4.22–5.81)
RDW: 23.9 % — ABNORMAL HIGH (ref 11.5–15.5)
WBC: 9.1 10*3/uL (ref 4.0–10.5)
nRBC: 0 % (ref 0.0–0.2)

## 2023-09-21 LAB — LACTIC ACID, PLASMA: Lactic Acid, Venous: 2.5 mmol/L (ref 0.5–1.9)

## 2023-09-21 LAB — GLUCOSE, CAPILLARY: Glucose-Capillary: 138 mg/dL — ABNORMAL HIGH (ref 70–99)

## 2023-09-21 LAB — I-STAT CG4 LACTIC ACID, ED: Lactic Acid, Venous: 2.3 mmol/L (ref 0.5–1.9)

## 2023-09-21 MED ORDER — ATORVASTATIN CALCIUM 40 MG PO TABS
40.0000 mg | ORAL_TABLET | Freq: Every day | ORAL | Status: DC
  Administered 2023-09-22 – 2023-10-08 (×16): 40 mg via ORAL
  Filled 2023-09-21 (×16): qty 1

## 2023-09-21 MED ORDER — MELATONIN 5 MG PO TABS
5.0000 mg | ORAL_TABLET | Freq: Every day | ORAL | Status: DC
  Administered 2023-09-21: 5 mg via ORAL
  Filled 2023-09-21: qty 1

## 2023-09-21 MED ORDER — OXYCODONE HCL 5 MG PO TABS
5.0000 mg | ORAL_TABLET | ORAL | Status: DC | PRN
  Administered 2023-09-21 – 2023-09-22 (×4): 5 mg via ORAL
  Filled 2023-09-21 (×4): qty 1

## 2023-09-21 MED ORDER — HEPARIN SODIUM (PORCINE) 5000 UNIT/ML IJ SOLN
5000.0000 [IU] | Freq: Three times a day (TID) | INTRAMUSCULAR | Status: DC
  Administered 2023-09-21 – 2023-09-22 (×3): 5000 [IU] via SUBCUTANEOUS
  Filled 2023-09-21 (×4): qty 1

## 2023-09-21 MED ORDER — PANTOPRAZOLE SODIUM 40 MG PO TBEC
40.0000 mg | DELAYED_RELEASE_TABLET | Freq: Every day | ORAL | Status: DC
  Administered 2023-09-22 – 2023-10-08 (×16): 40 mg via ORAL
  Filled 2023-09-21 (×16): qty 1

## 2023-09-21 MED ORDER — FLUOXETINE HCL 10 MG PO CAPS
10.0000 mg | ORAL_CAPSULE | Freq: Every day | ORAL | Status: DC
  Administered 2023-09-22 – 2023-10-08 (×16): 10 mg via ORAL
  Filled 2023-09-21 (×17): qty 1

## 2023-09-21 MED ORDER — SODIUM CHLORIDE 0.9 % IV BOLUS
1000.0000 mL | Freq: Once | INTRAVENOUS | Status: AC
  Administered 2023-09-21: 1000 mL via INTRAVENOUS

## 2023-09-21 MED ORDER — GABAPENTIN 300 MG PO CAPS
600.0000 mg | ORAL_CAPSULE | Freq: Two times a day (BID) | ORAL | Status: DC
  Administered 2023-09-22 – 2023-09-26 (×10): 600 mg via ORAL
  Filled 2023-09-21 (×10): qty 2

## 2023-09-21 MED ORDER — OXYCODONE HCL 5 MG PO TABS
5.0000 mg | ORAL_TABLET | ORAL | Status: DC | PRN
  Administered 2023-09-21: 5 mg via ORAL
  Filled 2023-09-21: qty 1

## 2023-09-21 MED ORDER — ACETAMINOPHEN 325 MG PO TABS
650.0000 mg | ORAL_TABLET | Freq: Four times a day (QID) | ORAL | Status: DC
  Administered 2023-09-21 – 2023-10-08 (×55): 650 mg via ORAL
  Filled 2023-09-21 (×58): qty 2

## 2023-09-21 MED ORDER — ASPIRIN 81 MG PO TBEC
81.0000 mg | DELAYED_RELEASE_TABLET | Freq: Every day | ORAL | Status: DC
  Administered 2023-09-22 – 2023-09-29 (×7): 81 mg via ORAL
  Filled 2023-09-21 (×7): qty 1

## 2023-09-21 MED ORDER — POLYETHYLENE GLYCOL 3350 17 G PO PACK
17.0000 g | PACK | Freq: Every day | ORAL | Status: DC
  Administered 2023-09-23 – 2023-10-03 (×9): 17 g via ORAL
  Filled 2023-09-21 (×11): qty 1

## 2023-09-21 MED ORDER — INSULIN ASPART 100 UNIT/ML IJ SOLN
0.0000 [IU] | Freq: Three times a day (TID) | INTRAMUSCULAR | Status: DC
  Administered 2023-09-22 – 2023-09-23 (×3): 1 [IU] via SUBCUTANEOUS

## 2023-09-21 MED ORDER — PIPERACILLIN-TAZOBACTAM 3.375 G IVPB 30 MIN
3.3750 g | Freq: Once | INTRAVENOUS | Status: AC
  Administered 2023-09-21: 3.375 g via INTRAVENOUS
  Filled 2023-09-21: qty 50

## 2023-09-21 MED ORDER — MELATONIN 3 MG PO TABS
6.0000 mg | ORAL_TABLET | Freq: Every day | ORAL | Status: DC
  Administered 2023-09-22 – 2023-09-23 (×2): 6 mg via ORAL
  Filled 2023-09-21 (×2): qty 2

## 2023-09-21 MED ORDER — NICOTINE 7 MG/24HR TD PT24
7.0000 mg | MEDICATED_PATCH | Freq: Every day | TRANSDERMAL | Status: DC
  Administered 2023-09-21 – 2023-10-08 (×18): 7 mg via TRANSDERMAL
  Filled 2023-09-21 (×19): qty 1

## 2023-09-21 MED ORDER — VANCOMYCIN HCL 1500 MG/300ML IV SOLN
1500.0000 mg | Freq: Once | INTRAVENOUS | Status: AC
  Administered 2023-09-21: 1500 mg via INTRAVENOUS
  Filled 2023-09-21: qty 300

## 2023-09-21 MED ORDER — MORPHINE SULFATE (PF) 4 MG/ML IV SOLN
4.0000 mg | Freq: Once | INTRAVENOUS | Status: AC
  Administered 2023-09-21: 4 mg via INTRAVENOUS
  Filled 2023-09-21: qty 1

## 2023-09-21 MED ORDER — NEBIVOLOL HCL 10 MG PO TABS
10.0000 mg | ORAL_TABLET | Freq: Every day | ORAL | Status: DC
  Administered 2023-09-23 – 2023-09-29 (×7): 10 mg via ORAL
  Filled 2023-09-21 (×8): qty 1

## 2023-09-21 MED ORDER — ALBUTEROL SULFATE (2.5 MG/3ML) 0.083% IN NEBU: 3.0000 mL | INHALATION_SOLUTION | Freq: Four times a day (QID) | RESPIRATORY_TRACT | Status: DC | PRN

## 2023-09-21 MED ORDER — EZETIMIBE 10 MG PO TABS
10.0000 mg | ORAL_TABLET | Freq: Every day | ORAL | Status: DC
  Administered 2023-09-22 – 2023-10-08 (×16): 10 mg via ORAL
  Filled 2023-09-21 (×16): qty 1

## 2023-09-21 MED ORDER — ENSURE ENLIVE PO LIQD
237.0000 mL | Freq: Two times a day (BID) | ORAL | Status: DC
  Administered 2023-09-22 – 2023-10-08 (×21): 237 mL via ORAL

## 2023-09-21 MED ORDER — AMLODIPINE BESYLATE 5 MG PO TABS
5.0000 mg | ORAL_TABLET | Freq: Every day | ORAL | Status: DC
  Administered 2023-09-22 – 2023-09-27 (×7): 5 mg via ORAL
  Filled 2023-09-21 (×7): qty 1

## 2023-09-21 NOTE — Progress Notes (Signed)
Patient has arrived to the unit via stretcher from ED. A/O x 4. VSS. Oriented patient to the room and staff. Education provided regarding safety precaution and patient verbalize understanding.

## 2023-09-21 NOTE — Assessment & Plan Note (Addendum)
Smokes 3-4 cigars a week -Nicotine patch 7 mg every 24 hours

## 2023-09-21 NOTE — Progress Notes (Signed)
ED Pharmacy Antibiotic Sign Off An antibiotic consult was received from an ED provider for vancomycin and zosyn per pharmacy dosing for wound infection. A chart review was completed to assess appropriateness.   The following one time order(s) were placed:  Vancomycin 1500 mg IV x 1 Zosyn 3.375g IV x 1  Further antibiotic and/or antibiotic pharmacy consults should be ordered by the admitting provider if indicated.   Thank you for allowing pharmacy to be a part of this patient's care.   Daylene Posey, [facility]  Clinical Pharmacist 09/21/23 3:08 PM

## 2023-09-21 NOTE — H&P (Addendum)
Hospital Admission History and Physical Service Pager: [phone]  Patient name: [name] Medical record number: [id number] Date of Birth: 1951-04-18 Age: 72 y.o. Gender: male  Primary Care Provider: Erick Alley, DO Consultants: vascular surgery, podiatry Code Status: Full  Preferred Emergency Contact:  Horace Porteous - Wife - [phone] Zavien Francione - Brother - [phone]  Chief Complaint: gangrene of toe on right foot  Assessment and Plan: RIGBY LAQUE is a 72 y.o. male presenting with gangrene of right toes.  Patient with increased pain and color changes x 2 weeks.  Sent by podiatry for evaluation by vascular surgery and potential surgical intervention.  No sign of systemic infection currently, afebrile, no leukocytosis, wound w/o purulence. Assessment & Plan Gangrene of right foot (HCC) Patient with pain in his right toe and color changes worsening x 2 weeks.  Sent by podiatry for more evaluation and potential surgical intervention.  Vascular evaluated patient and plans to do ABI and right arterial duplex.  -Admitted to [facility], med-surg, Dr. 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  Last hemoglobin A1c was 6.4 1 month ago. -Sensitive SSI -CBG checks before meals and at bedtime -Carb modified diet Tobacco use disorder Smokes 3-4 cigars a week -Nicotine patch 7 mg every 24 hours Iron deficiency anemia, unspecified iron deficiency anemia type Hemoglobin 10.8 on admission.  Appears to be chronically anemic with a baseline 9-10. -AM CBC -Consider iron studies and further evaluation if continues to decrease   Chronic and Stable Problems:  HTN - continue amlodipine 5mg  every day,  nebivolol 10mg  QD HLD - continue atorvastatin 40mg  QD, zetia 10mg  every day Diabetic neuropathy - continue gabapentin 600mg  BID  FEN/GI: regular VTE Prophylaxis: heparin  Disposition: med-surg, inpatient status  History of Present Illness:  [name] is a 73 y.o. male presenting with gangrenous changes to his right second and third toe.  Patient states around 4 months ago his second toenail came off.  He then began having pain in his right thigh and leg.  Patient had a right common femoral to peroneal bypass with greater saphenous vein on 08/09/23, and then noticed that his toe started turning black around 2 weeks ago.  Patient went to his podiatrist today and was advised to come straight to the ED.  He complains of pain in his right foot.  Patient denies drainage from the foot, fever, chills, chest pain, shortness of breath, nausea, vomiting.  In the ED, patient was afebrile with no leukocytosis.  CMP unremarkable.  Lactic 2.3.  Patient was given vancomycin and Zosyn, along with 1 L bolus IVF and 4 mg morphine for pain.  X-ray of right foot with gas within the soft tissues of the distal second and third digits, and erosive change at the third PIP joint suspicious for osteomyelitis and joint sepsis.  MRI ordered and pending. Dr. Lenell Antu with vascular surgery evaluated patient in the ED.  Recommended checking ABI and RLE arterial duplex  Podiatry plans to see patient tomorrow, will likely require surgical intervention during this admission.  Review Of Systems: Per HPI with the following additions: none  Pertinent Past Medical History: PAD Tobacco use disorder Diabetic neuropathy  Remainder reviewed in history tab.   Pertinent Past Surgical History: Left AKA  Anterior cervical fusion Bilateral femoral-peroneal bypass graft R femoral endarterectomy, L embolectomy R inguinal hernia repair   Remainder reviewed in history tab.  Pertinent Social History: Tobacco use: smokes cigars about  3-4 a week.  Alcohol use: drinks once a month Other Substance use: denies Lives with wife  Pertinent Family History: Mother - colon CA, HLD, DM Father - heart disease, HTN  Remainder reviewed in history tab.   Important Outpatient Medications: Albuterol 2 puffs q6h PRN Amlodipine 5mg  every day Atorvastatin 40mg  every day Plavix 75mg  every day Zetia 10mg  every day Prozac 10mg  every day Gabapentin 600mg  BID Melatonin 6mg  every day Metformin 1000mg  BID Midodrine 5mg  TID Nebivolol 10mg  every day Oxycodone-acetaminophen 5-325mg  q4h PRN Protonix 40mg  every day  Remainder reviewed in medication history.   Objective: BP 134/80   Pulse 80   Temp (!) 97.3 F (36.3 C) (Oral)   Resp (!) 24   Ht 5\' 7"  (1.702 m)   Wt 82.6 kg   SpO2 100%   BMI 28.52 kg/m  Exam: General: No acute distress, resting comfortably in bed Eyes: EOMI ENTM: Moist mucous membranes Cardiovascular: RRR, no murmurs Respiratory: Some coarse breath sounds noted left lung fields, no increased work of breathing on RA Gastrointestinal: Soft, non-distended, non-tender MSK: Left AKA. Unable to palpate DP and PT pulses on RLE. R second and third toes with gangrenous changes. No erythema, warmth, or drainage to RLE, does not appear infected. Please see photos of foot in Dr. Higinio Plan note 9/26 Neuro: A&Ox4.    Labs:  CBC BMET  Recent Labs  Lab 09/21/23 1329  WBC 9.1  HGB 10.8*  HCT 37.0*  PLT 378   Recent Labs  Lab 09/21/23 1329  NA 138  K 3.6  CL 101  CO2 22  BUN 8  CREATININE 0.74  GLUCOSE 167*  CALCIUM 9.0    Lactic acid 2.3  Imaging Studies Performed: XR Foot Right IMPRESSION: 1. Gas within the soft tissues of the distal second and third digits presumably corresponding to history of necrotic toes. 2. Erosive change centered at the third PIP joint suspicious for osteomyelitis and joint sepsis.  MR Foot Right Pending  Para March, DO 09/21/2023, 6:16 PM PGY-1, [facility]  Family Medicine  FPTS Intern pager: [phone], text pages welcome Secure chat group [facility] Teaching Service

## 2023-09-21 NOTE — Assessment & Plan Note (Addendum)
Takes metformin.  Last hemoglobin A1c was 6.4 1 month ago. -Sensitive SSI -CBG checks before meals and at bedtime -Carb modified diet

## 2023-09-21 NOTE — Progress Notes (Signed)
FMTS Brief Progress Note  S:Went bedside w/ Dr. Weston Settle to se patient. Patient laying comfortably in bed, w/ tenderness about his R. Foot. Report's no complaints, but rates his pain an 8 out of 10.  Patient notes that the pain medicine is helping to take the edge off the pain however this does not last long enough. Patient also requesting medicine for sleep.    O: BP (!) 180/67 (BP Location: Right Arm)   Pulse 81   Temp 98.3 F (36.8 C) (Oral)   Resp 20   Ht 5\' 7"  (1.702 m)   Wt 82.6 kg   SpO2 99%   BMI 28.52 kg/m   Ext: Dry gangrene noted on 2nd and 3rd toe  A/P: Gangrene of right foot Patient stable, but notes significant pain, rated 8/10. Day team discussed potentially switching patient to Oxy q2h prn.  Provider feels like this change is appropriate as patient notes pain relief not lasting long enough.  -Oxy 5 mg q2h   Sleep Aid Patient noting he usually takes something to help him fall asleep. Will trial melatonin.  -Melatonin 5 mg  - Orders reviewed. Labs for AM ordered, which was adjusted as needed.   Bess Kinds, MD 09/21/2023, 9:00 PM PGY-3, [facility] Night Resident  Please page [phone] with questions.

## 2023-09-21 NOTE — Assessment & Plan Note (Addendum)
Hemoglobin 10.8 on admission.  Appears to be chronically anemic with a baseline 9-10. -AM CBC -Consider iron studies and further evaluation if continues to decrease

## 2023-09-21 NOTE — ED Notes (Signed)
ED TO INPATIENT HANDOFF REPORT  ED Nurse Name and Phone #: [phone]  S Name/Age/Gender [name] 72 y.o. male Room/Bed: 044C/044C  Code Status   Code Status: Full Code  Home/SNF/Other Home Patient oriented to: self, place, time, and situation Is this baseline? Yes   Triage Complete: Triage complete  Chief Complaint Gangrene of toe of right foot (HCC) [I96]  Triage Note Pt sent by foot doctor for gangrene of right foot. Pt's 2nd and 3rd toes of right foot is necrotic. Pt's 4 toe has 1+ swelling. Pt's foot is erythematous and hot to touch. Pt has 1+ right pedal pulse, pt able to wiggle toes.   Allergies Allergies  Allergen Reactions   Glipizide Other (See Comments)    REACTION IS SIDE EFFECT Severe hypoglycemia to 40s.     Level of Care/Admitting Diagnosis ED Disposition     ED Disposition  Admit   Condition  --   Comment  Hospital Area: [facility] [[id number]]  Level of Care: Med-Surg [16]  May admit patient to [facility] or [facility] if equivalent level of care is available:: No  Covid Evaluation: Asymptomatic - no recent exposure (last 10 days) testing not required  Diagnosis: Gangrene of toe of right foot [facility]) [[id number]]  Admitting Physician: Para March [[id number]]  Attending Physician: Carney Living [phone]  Certification:: I certify this patient will need inpatient services for at least 2 midnights  Expected Medical Readiness: 09/25/2023          B Medical/Surgery History Past Medical History:  Diagnosis Date   Adhesive capsulitis 03/10/2020   Anemia    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   Critical lower limb ischemia (HCC) 12/19/2019   Diabetes mellitus    takes Metformin daily   GERD  (gastroesophageal reflux disease)    takes Protonix daily   History of blood transfusion    "related to low HgB" ((09/10/2015   Hyperlipidemia    takes Vytorin daily   Hypertension    takes Benazepril and Bystolic daily   PAD (peripheral artery disease) (HCC)    Pneumonia    Radiculopathy, cervical region 02/11/2017   Shortness of breath dyspnea    with exertion   Tobacco user    Toe fracture, right 05/09/2011   Wears glasses    Past Surgical History:  Procedure Laterality Date   ABDOMINAL AORTOGRAM W/LOWER EXTREMITY N/A 12/11/2017   Procedure: ABDOMINAL AORTOGRAM W/LOWER EXTREMITY;  Surgeon: Chuck Hint, MD;  Location: [facility] INVASIVE CV LAB;  Service: Cardiovascular;  Laterality: N/A;   ABDOMINAL AORTOGRAM W/LOWER EXTREMITY Bilateral 04/22/2020   Procedure: ABDOMINAL AORTOGRAM W/LOWER EXTREMITY;  Surgeon: Cephus Shelling, MD;  Location: [facility] INVASIVE CV LAB;  Service: Cardiovascular;  Laterality: Bilateral;   ABDOMINAL AORTOGRAM W/LOWER EXTREMITY N/A 07/27/2023   Procedure: ABDOMINAL AORTOGRAM W/LOWER EXTREMITY;  Surgeon: Cephus Shelling, MD;  Location: [facility] INVASIVE CV LAB;  Service: Cardiovascular;  Laterality: N/A;   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   AMPUTATION Left 02/19/2021   Procedure: LEFT ABOVE KNEE AMPUTATION;  Surgeon: Leonie Douglas, MD;  Location: [facility] OR;  Service: Vascular;  Laterality: Left;   ANTERIOR CERVICAL DECOMP/DISCECTOMY FUSION  03/08/12   C6-7   ANTERIOR CERVICAL DECOMP/DISCECTOMY FUSION  03/08/2012  Procedure: ANTERIOR CERVICAL DECOMPRESSION/DISCECTOMY FUSION 1 LEVEL/HARDWARE REMOVAL;  Surgeon: Maeola Harman, MD;  Location: [facility] NEURO ORS;  Service: Neurosurgery;  Laterality: N/A;  revison of C5-7 anterior cervical decompression with fusion with Cervical Five-Thoracic One anterior cervical decompression with fusion with interbody prothesis plating and bonegraft   BACK SURGERY  1996   BIOPSY  12/05/2020   Procedure: BIOPSY;  Surgeon: Meryl Dare, MD;  Location[facility] ENDOSCOPY;  Service: Endoscopy;;   BYPASS GRAFT FEMORAL-PERONEAL Left 11/11/2016   Procedure: REDO LEFT FEMORAL-PERONEAL BYPASS WITH PROPATEN X 80CM GRAFT;  Surgeon: Chuck Hint, MD;  Location: [facility] OR;  Service: Vascular;  Laterality: Left;   BYPASS GRAFT FEMORAL-PERONEAL Right 08/09/2023   Procedure: RIGHT  FEMORAL  ARTERY TO PERONEAL ARTERY BYPASS GRAFT USING GREATER SAPHENOUS VEIN;  Surgeon: Cephus Shelling, MD;  Location: [facility] OR;  Service: Vascular;  Laterality: Right;   COLONOSCOPY W/ BIOPSIES AND POLYPECTOMY  08/17/2012   f/u 5 years, 4 polyps, no high grade dysplasia or malignancy, tubular adenoma, hyperplastic polyops   COLONOSCOPY WITH PROPOFOL N/A 12/05/2020   Procedure: COLONOSCOPY WITH PROPOFOL;  Surgeon: Meryl Dare, MD;  Location: [facility] ENDOSCOPY;  Service: Endoscopy;  Laterality: N/A;   EMBOLECTOMY Left 12/19/2019   Procedure: LEFT FEMERAL- PERONEAL THROMBECTOMY;  Surgeon: Cephus Shelling, MD;  Location: [facility] OR;  Service: Vascular;  Laterality: Left;   ENDARTERECTOMY FEMORAL Right 04/24/2020   Procedure: RIGHT FEMORAL ENDARTERECTOMY;  Surgeon: Cephus Shelling, MD;  Location: [facility] OR;  Service: Vascular;  Laterality: Right;   ENTEROSCOPY N/A 12/11/2015   Procedure: ENTEROSCOPY;  Surgeon: Jeani Hawking, MD;  Location: [facility] ENDOSCOPY;  Service: Endoscopy;  Laterality: N/A;   ENTEROSCOPY N/A 03/29/2018   Procedure: ENTEROSCOPY;  Surgeon: Jeani Hawking, MD;  Location: [facility] ENDOSCOPY;  Service: Endoscopy;  Laterality: N/A;   ESOPHAGOGASTRODUODENOSCOPY  08/17/2012   normal esophagus and GEJ, diffuse gastritis with erythema- no malignancy, reactive gastropathy  with focal intestinal metaplasia   ESOPHAGOGASTRODUODENOSCOPY (EGD) WITH PROPOFOL N/A 12/05/2020   Procedure: ESOPHAGOGASTRODUODENOSCOPY (EGD) WITH PROPOFOL;  Surgeon: Meryl Dare, MD;  Location: [facility] ENDOSCOPY;  Service: Endoscopy;  Laterality: N/A;   FEMORAL-POPLITEAL BYPASS GRAFT  Left 01/06/2016   Procedure: Left  COMMON FEMORAL-BELOW KNEE POPLITEAL ARTERY Bypass using non-reversed translocated saphenous vein graft from left leg;  Surgeon: Pryor Ochoa, MD;  Location: [facility] OR;  Service: Vascular;  Laterality: Left;   GIVENS CAPSULE STUDY N/A 11/24/2015   Procedure: GIVENS CAPSULE STUDY;  Surgeon: Charna Elizabeth, MD;  Location: [facility] ENDOSCOPY;  Service: Endoscopy;  Laterality: N/A;   HOT HEMOSTASIS N/A 03/29/2018   Procedure: HOT HEMOSTASIS (ARGON PLASMA COAGULATION/BICAP);  Surgeon: Jeani Hawking, MD;  Location: [facility] ENDOSCOPY;  Service: Endoscopy;  Laterality: N/A;   INGUINAL HERNIA REPAIR  1990's   right   INTRAOPERATIVE ARTERIOGRAM Left 01/06/2016   Procedure: INTRA OPERATIVE ARTERIOGRAM LEFT LOWER LEG;  Surgeon: Pryor Ochoa, MD;  Location: [facility] OR;  Service: Vascular;  Laterality: Left;   INTRAOPERATIVE ARTERIOGRAM Left 11/11/2016   Procedure: INTRA OPERATIVE ARTERIOGRAM LEFT LOWER EXTRIMITY;  Surgeon: Chuck Hint, MD;  Location: [facility] OR;  Service: Vascular;  Laterality: Left;   IR ANGIOGRAM FOLLOW UP STUDY  12/11/2017   IR GENERIC HISTORICAL  10/24/2016   IR ANGIOGRAM FOLLOW UP STUDY   LOWER EXTREMITY ANGIOGRAM Bilateral 07/30/2015   Procedure: Lower Extremity Angiogram;  Surgeon: Fransisco Hertz, MD;  Location: [facility] INVASIVE CV LAB;  Service: Cardiovascular;  Laterality: Bilateral;   LUMBAR DISC SURGERY  1996   "lower"   PATCH ANGIOPLASTY Left 12/19/2019   Procedure: LEFT FEMORAL -PERONEAL PATCH ANGIOPLASTY WITH XENOSURE BIOLOGIC PATCH  ;  Surgeon: Cephus Shelling, MD;  Location: [facility] OR;  Service: Vascular;  Laterality: Left;   PATCH ANGIOPLASTY Right 04/24/2020   Procedure: Patch Angioplasty of Right Femoral Artery using Long Xenosure Biologic Patch 1cm x 14 cm;  Surgeon: Cephus Shelling, MD;  Location: [facility] OR;  Service: Vascular;  Laterality: Right;   PERIPHERAL VASCULAR CATHETERIZATION N/A 07/30/2015   Procedure: Abdominal Aortogram;  Surgeon: Fransisco Hertz, MD;   Location: [facility] INVASIVE CV LAB;  Service: Cardiovascular;  Laterality: N/A;   PERIPHERAL VASCULAR CATHETERIZATION N/A 10/24/2016   Procedure: Abdominal Aortogram;  Surgeon: Chuck Hint, MD;  Location: [facility] INVASIVE CV LAB;  Service: Cardiovascular;  Laterality: N/A;   PERIPHERAL VASCULAR CATHETERIZATION N/A 10/24/2016   Procedure: Lower Extremity Angiography;  Surgeon: Chuck Hint, MD;  Location: [facility] INVASIVE CV LAB;  Service: Cardiovascular;  Laterality: N/A;   PERIPHERAL VASCULAR INTERVENTION Right 12/11/2017   Procedure: PERIPHERAL VASCULAR INTERVENTION;  Surgeon: Chuck Hint, MD;  Location: [facility] INVASIVE CV LAB;  Service: Cardiovascular;  Laterality: Right;   PERIPHERAL VASCULAR INTERVENTION Right 07/27/2023   Procedure: PERIPHERAL VASCULAR INTERVENTION;  Surgeon: Cephus Shelling, MD;  Location: [facility] INVASIVE CV LAB;  Service: Cardiovascular;  Laterality: Right;  Common Illiac   VASCULAR SURGERY  ~ 2007   Stent SFA    VEIN HARVEST Left 01/06/2016   Procedure: LEFT GREATER SAPPHENOUS VEIN HARVEST;  Surgeon: Pryor Ochoa, MD;  Location: [facility] OR;  Service: Vascular;  Laterality: Left;   VEIN HARVEST Right 08/09/2023   Procedure: RIGHT GREATER SAPHENOUS VEIN HARVEST;  Surgeon: Cephus Shelling, MD;  Location: [facility] OR;  Service: Vascular;  Laterality: Right;     A IV Location/Drains/Wounds Patient Lines/Drains/Airways Status     Active Line/Drains/Airways     Name Placement date Placement time Site Days   Peripheral IV 09/21/23 20 G Left;Posterior Hand 09/21/23  1448  Hand  less than 1   Pressure Injury 08/16/23 Buttocks Left Stage 2 -  Partial thickness loss of dermis presenting as a shallow open injury with a red, pink wound bed without slough. Small stage 2 just inside left buttock 08/16/23  1720  -- 36   Wound / Incision (Open or Dehisced) 08/09/23 Diabetic ulcer Toe (Comment  which one) Anterior;Right Non healing wound 08/09/23  1900  Toe (Comment  which one)   43   Wound / Incision (Open or Dehisced) 08/16/23 Groin Anterior;Right 08/16/23  --  Groin  36            Intake/Output Last 24 hours No intake or output data in the 24 hours ending 09/21/23 1740  Labs/Imaging Results for orders placed or performed during the hospital encounter of 09/21/23 (from the past 48 hour(s))  Basic metabolic panel     Status: Abnormal   Collection Time: 09/21/23  1:29 PM  Result Value Ref Range   Sodium 138 135 - 145 mmol/L   Potassium 3.6 3.5 - 5.1 mmol/L   Chloride 101 98 - 111 mmol/L   CO2 22 22 - 32 mmol/L   Glucose, Bld 167 (H) 70 - 99 mg/dL    Comment: Glucose reference range applies only to samples taken after fasting for at least 8 hours.   BUN 8 8 - 23 mg/dL   Creatinine, Ser 1.61 0.61 - 1.24 mg/dL   Calcium 9.0 8.9 -  10.3 mg/dL   GFR, Estimated >40 >98 mL/min    Comment: (NOTE) Calculated using the CKD-EPI Creatinine Equation (2021)    Anion gap 15 5 - 15    Comment: Performed at [facility] Lab, [address] [place], [place] [address]  CBC with Differential     Status: Abnormal   Collection Time: 09/21/23  1:29 PM  Result Value Ref Range   WBC 9.1 4.0 - 10.5 K/uL   RBC 5.20 4.22 - 5.81 MIL/uL   Hemoglobin 10.8 (L) 13.0 - 17.0 g/dL   HCT 78.2 (L) 95.6 - 21.3 %   MCV 71.2 (L) 80.0 - 100.0 fL   MCH 20.8 (L) 26.0 - 34.0 pg   MCHC 29.2 (L) 30.0 - 36.0 g/dL   RDW 08.6 (H) 57.8 - 46.9 %   Platelets 378 150 - 400 K/uL   nRBC 0.0 0.0 - 0.2 %   Neutrophils Relative % 74 %   Neutro Abs 6.7 1.7 - 7.7 K/uL   Lymphocytes Relative 16 %   Lymphs Abs 1.5 0.7 - 4.0 K/uL   Monocytes Relative 7 %   Monocytes Absolute 0.7 0.1 - 1.0 K/uL   Eosinophils Relative 2 %   Eosinophils Absolute 0.2 0.0 - 0.5 K/uL   Basophils Relative 0 %   Basophils Absolute 0.0 0.0 - 0.1 K/uL   Immature Granulocytes 1 %   Abs Immature Granulocytes 0.05 0.00 - 0.07 K/uL    Comment: Performed at [facility] Lab, [address], [place], [place] [address]  I-Stat  Lactic Acid, ED     Status: Abnormal   Collection Time: 09/21/23  2:58 PM  Result Value Ref Range   Lactic Acid, Venous 2.3 (HH) 0.5 - 1.9 mmol/L   Comment NOTIFIED PHYSICIAN    *Note: Due to a large number of results and/or encounters for the requested time period, some results have not been displayed. A complete set of results can be found in Results Review.   DG Foot Complete Right  Result Date: 09/21/2023 CLINICAL DATA:  Necrotic toes wet and black in color EXAM: RIGHT FOOT COMPLETE - 3+ VIEW COMPARISON:  05/09/2011 FINDINGS: Old fifth metatarsal fracture deformity. Gas within the soft tissues of the distal second and third digits presumably corresponding to history of necrotic toes. Erosive change lateral base of third middle phalanx and head of third proximal phalanx centered at the PIP joint suspicious for osteomyelitis and joint sepsis. Vascular calcifications. Moderate degenerative change at the first MTP joint. IMPRESSION: 1. Gas within the soft tissues of the distal second and third digits presumably corresponding to history of necrotic toes. 2. Erosive change centered at the third PIP joint suspicious for osteomyelitis and joint sepsis. Electronically Signed   By: Jasmine Pang M.D.   On: 09/21/2023 16:01    Pending Labs Unresulted Labs (From admission, onward)     Start     Ordered   09/22/23 0500  Basic metabolic panel  Tomorrow morning,   R        09/21/23 1718   09/22/23 0500  CBC  Tomorrow morning,   R        09/21/23 1718   09/21/23 1428  Blood Cultures x 2 sites  BLOOD CULTURE X 2,   STAT      09/21/23 1428            Vitals/Pain Today's Vitals   09/21/23 1449 09/21/23 1500 09/21/23 1649 09/21/23 1700  BP: 112/80 123/82 122/74 134/80  Pulse: 82 79 80 80  Resp: 16 (!) 26 (!) 21 (!) 24  Temp:      TempSrc:      SpO2: 100% 99% 100% 100%  Weight:      Height:      PainSc:        Isolation Precautions No active isolations  Medications Medications   vancomycin (VANCOREADY) IVPB 1500 mg/300 mL (has no administration in time range)  heparin injection 5,000 Units (has no administration in time range)  nicotine (NICODERM CQ - dosed in mg/24 hr) patch 7 mg (has no administration in time range)  insulin aspart (novoLOG) injection 0-9 Units (has no administration in time range)  acetaminophen (TYLENOL) tablet 650 mg (has no administration in time range)  oxyCODONE (Oxy IR/ROXICODONE) immediate release tablet 5 mg (has no administration in time range)  polyethylene glycol (MIRALAX / GLYCOLAX) packet 17 g (has no administration in time range)  morphine (PF) 4 MG/ML injection 4 mg (4 mg Intravenous Given 09/21/23 1458)  piperacillin-tazobactam (ZOSYN) IVPB 3.375 g (0 g Intravenous Stopped 09/21/23 1722)  sodium chloride 0.9 % bolus 1,000 mL (0 mLs Intravenous Stopped 09/21/23 1739)    Mobility walks with device     Focused Assessments Cardiac Assessment Handoff:    Lab Results  Component Value Date   TROPONINI <0.03 11/18/2016   No results found for: "DDIMER" Does the Patient currently have chest pain? No   ,    R Recommendations: See Admitting Provider Note  Report given to:   Additional Notes:

## 2023-09-21 NOTE — ED Triage Notes (Signed)
Pt sent by foot doctor for gangrene of right foot. Pt's 2nd and 3rd toes of right foot is necrotic. Pt's 4 toe has 1+ swelling. Pt's foot is erythematous and hot to touch. Pt has 1+ right pedal pulse, pt able to wiggle toes.

## 2023-09-21 NOTE — Consult Note (Signed)
[facility] [place]  ASSESSMENT / PLAN: [name] is a 72 y.o. male status post right common femoral to peroneal bypass with greater saphenous vein on 08/09/23 for right forefoot gangrene.   Recommend:  Abstinence from all tobacco products. Blood glucose control with goal A1c < 7%. Blood pressure control with goal blood pressure < 140/90 mmHg. Lipid reduction therapy with goal LDL-C <100 mg/dL  Aspirin 81mg  PO QD.  Atorvastatin 40-80mg  PO QD (or other "high intensity" statin therapy).  Will check ABI / Duplex. Suspect patient is maximally revascularized based on clinical exam.  CHIEF COMPLAINT: dry gangrene of right toes  HISTORY OF PRESENT ILLNESS: [name] is a 72 y.o. male well-known to our service who recently underwent right common femoral to peroneal artery bypass with greater saphenous vein on 08/09/2023 with Dr. Chestine Spore.  This was done for gangrenous changes of the right second and third toes.  The patient followed up with Dr. Annamary Rummage earlier today and noted worsening pain to his right foot he was instructed to present to the ER for evaluation.  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   Critical lower limb ischemia (HCC) 12/19/2019   Diabetes mellitus    takes Metformin daily   GERD (gastroesophageal reflux disease)    takes Protonix daily   History of blood transfusion    "related to low HgB" ((09/10/2015   Hyperlipidemia    takes Vytorin daily   Hypertension    takes Benazepril and Bystolic daily   PAD (peripheral artery disease) (HCC)    Pneumonia    Radiculopathy, cervical region 02/11/2017   Shortness of breath dyspnea    with exertion    Tobacco user    Toe fracture, right 05/09/2011   Wears glasses     Past Surgical History:  Procedure Laterality Date   ABDOMINAL AORTOGRAM W/LOWER EXTREMITY N/A 12/11/2017   Procedure: ABDOMINAL AORTOGRAM W/LOWER EXTREMITY;  Surgeon: Chuck Hint, MD;  Location: [facility] INVASIVE CV LAB;  Service: Cardiovascular;  Laterality: N/A;   ABDOMINAL AORTOGRAM W/LOWER EXTREMITY Bilateral 04/22/2020   Procedure: ABDOMINAL AORTOGRAM W/LOWER EXTREMITY;  Surgeon: Cephus Shelling, MD;  Location: [facility] INVASIVE CV LAB;  Service: Cardiovascular;  Laterality: Bilateral;   ABDOMINAL AORTOGRAM W/LOWER EXTREMITY N/A 07/27/2023   Procedure: ABDOMINAL AORTOGRAM W/LOWER EXTREMITY;  Surgeon: Cephus Shelling, MD;  Location: [facility] INVASIVE CV LAB;  Service: Cardiovascular;  Laterality: N/A;   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   AMPUTATION Left 02/19/2021   Procedure: LEFT ABOVE KNEE AMPUTATION;  Surgeon: Leonie Douglas, MD;  Location: [facility] OR;  Service: Vascular;  Laterality: Left;   ANTERIOR CERVICAL DECOMP/DISCECTOMY FUSION  03/08/12   C6-7   ANTERIOR CERVICAL DECOMP/DISCECTOMY FUSION  03/08/2012   Procedure: ANTERIOR CERVICAL DECOMPRESSION/DISCECTOMY FUSION 1 LEVEL/HARDWARE REMOVAL;  Surgeon: Maeola Harman, MD;  Location: [facility] NEURO ORS;  Service: Neurosurgery;  Laterality: N/A;  revison of C5-7 anterior cervical decompression with fusion with Cervical Five-Thoracic One anterior cervical decompression with fusion with interbody prothesis plating and bonegraft   BACK SURGERY  1996   BIOPSY  12/05/2020   Procedure: BIOPSY;  Surgeon: Meryl Dare, MD;  Location: [facility]  ENDOSCOPY;  Service: Endoscopy;;   BYPASS GRAFT FEMORAL-PERONEAL Left 11/11/2016   Procedure: REDO LEFT FEMORAL-PERONEAL BYPASS WITH PROPATEN X 80CM GRAFT;  Surgeon: Chuck Hint, MD;  Location: [facility] OR;  Service: Vascular;  Laterality: Left;   BYPASS GRAFT FEMORAL-PERONEAL Right 08/09/2023   Procedure: RIGHT  FEMORAL  ARTERY TO  PERONEAL ARTERY BYPASS GRAFT USING GREATER SAPHENOUS VEIN;  Surgeon: Cephus Shelling, MD;  Location: [facility] OR;  Service: Vascular;  Laterality: Right;   COLONOSCOPY W/ BIOPSIES AND POLYPECTOMY  08/17/2012   f/u 5 years, 4 polyps, no high grade dysplasia or malignancy, tubular adenoma, hyperplastic polyops   COLONOSCOPY WITH PROPOFOL N/A 12/05/2020   Procedure: COLONOSCOPY WITH PROPOFOL;  Surgeon: Meryl Dare, MD;  Location: [facility] ENDOSCOPY;  Service: Endoscopy;  Laterality: N/A;   EMBOLECTOMY Left 12/19/2019   Procedure: LEFT FEMERAL- PERONEAL THROMBECTOMY;  Surgeon: Cephus Shelling, MD;  Location: [facility] OR;  Service: Vascular;  Laterality: Left;   ENDARTERECTOMY FEMORAL Right 04/24/2020   Procedure: RIGHT FEMORAL ENDARTERECTOMY;  Surgeon: Cephus Shelling, MD;  Location: [facility] OR;  Service: Vascular;  Laterality: Right;   ENTEROSCOPY N/A 12/11/2015   Procedure: ENTEROSCOPY;  Surgeon: Jeani Hawking, MD;  Location: [facility] ENDOSCOPY;  Service: Endoscopy;  Laterality: N/A;   ENTEROSCOPY N/A 03/29/2018   Procedure: ENTEROSCOPY;  Surgeon: Jeani Hawking, MD;  Location: [facility] ENDOSCOPY;  Service: Endoscopy;  Laterality: N/A;   ESOPHAGOGASTRODUODENOSCOPY  08/17/2012   normal esophagus and GEJ, diffuse gastritis with erythema- no malignancy, reactive gastropathy  with focal intestinal metaplasia   ESOPHAGOGASTRODUODENOSCOPY (EGD) WITH PROPOFOL N/A 12/05/2020   Procedure: ESOPHAGOGASTRODUODENOSCOPY (EGD) WITH PROPOFOL;  Surgeon: Meryl Dare, MD;  Location: [facility] ENDOSCOPY;  Service: Endoscopy;  Laterality: N/A;   FEMORAL-POPLITEAL BYPASS GRAFT Left 01/06/2016   Procedure: Left  COMMON FEMORAL-BELOW KNEE POPLITEAL ARTERY Bypass using non-reversed translocated saphenous vein graft from left leg;  Surgeon: Pryor Ochoa, MD;  Location: [facility] OR;  Service: Vascular;  Laterality: Left;   GIVENS CAPSULE STUDY N/A 11/24/2015   Procedure: GIVENS CAPSULE STUDY;  Surgeon: Charna Elizabeth, MD;  Location: [facility] ENDOSCOPY;  Service:  Endoscopy;  Laterality: N/A;   HOT HEMOSTASIS N/A 03/29/2018   Procedure: HOT HEMOSTASIS (ARGON PLASMA COAGULATION/BICAP);  Surgeon: Jeani Hawking, MD;  Location: [facility] ENDOSCOPY;  Service: Endoscopy;  Laterality: N/A;   INGUINAL HERNIA REPAIR  1990's   right   INTRAOPERATIVE ARTERIOGRAM Left 01/06/2016   Procedure: INTRA OPERATIVE ARTERIOGRAM LEFT LOWER LEG;  Surgeon: Pryor Ochoa, MD;  Location: [facility] OR;  Service: Vascular;  Laterality: Left;   INTRAOPERATIVE ARTERIOGRAM Left 11/11/2016   Procedure: INTRA OPERATIVE ARTERIOGRAM LEFT LOWER EXTRIMITY;  Surgeon: Chuck Hint, MD;  Location: [facility] OR;  Service: Vascular;  Laterality: Left;   IR ANGIOGRAM FOLLOW UP STUDY  12/11/2017   IR GENERIC HISTORICAL  10/24/2016   IR ANGIOGRAM FOLLOW UP STUDY   LOWER EXTREMITY ANGIOGRAM Bilateral 07/30/2015   Procedure: Lower Extremity Angiogram;  Surgeon: Fransisco Hertz, MD;  Location: [facility] INVASIVE CV LAB;  Service: Cardiovascular;  Laterality: Bilateral;   LUMBAR DISC SURGERY  1996   "lower"   PATCH ANGIOPLASTY Left 12/19/2019   Procedure: LEFT FEMORAL -PERONEAL PATCH ANGIOPLASTY WITH XENOSURE BIOLOGIC PATCH  ;  Surgeon: Cephus Shelling, MD;  Location: [facility] OR;  Service: Vascular;  Laterality: Left;   PATCH ANGIOPLASTY Right 04/24/2020   Procedure: Patch Angioplasty of Right Femoral Artery using Long Xenosure Biologic Patch 1cm x 14 cm;  Surgeon: Cephus Shelling, MD;  Location:  [facility] OR;  Service: Vascular;  Laterality: Right;   PERIPHERAL VASCULAR CATHETERIZATION N/A 07/30/2015   Procedure: Abdominal Aortogram;  Surgeon: Fransisco Hertz, MD;  Location: [facility] INVASIVE CV LAB;  Service: Cardiovascular;  Laterality: N/A;   PERIPHERAL VASCULAR CATHETERIZATION N/A 10/24/2016   Procedure: Abdominal Aortogram;  Surgeon: Chuck Hint, MD;  Location: [facility] INVASIVE CV LAB;  Service: Cardiovascular;  Laterality: N/A;   PERIPHERAL VASCULAR CATHETERIZATION N/A 10/24/2016   Procedure: Lower Extremity Angiography;   Surgeon: Chuck Hint, MD;  Location: [facility] INVASIVE CV LAB;  Service: Cardiovascular;  Laterality: N/A;   PERIPHERAL VASCULAR INTERVENTION Right 12/11/2017   Procedure: PERIPHERAL VASCULAR INTERVENTION;  Surgeon: Chuck Hint, MD;  Location: [facility] INVASIVE CV LAB;  Service: Cardiovascular;  Laterality: Right;   PERIPHERAL VASCULAR INTERVENTION Right 07/27/2023   Procedure: PERIPHERAL VASCULAR INTERVENTION;  Surgeon: Cephus Shelling, MD;  Location: [facility] INVASIVE CV LAB;  Service: Cardiovascular;  Laterality: Right;  Common Illiac   VASCULAR SURGERY  ~ 2007   Stent SFA    VEIN HARVEST Left 01/06/2016   Procedure: LEFT GREATER SAPPHENOUS VEIN HARVEST;  Surgeon: Pryor Ochoa, MD;  Location: [facility] OR;  Service: Vascular;  Laterality: Left;   VEIN HARVEST Right 08/09/2023   Procedure: RIGHT GREATER SAPHENOUS VEIN HARVEST;  Surgeon: Cephus Shelling, MD;  Location: [facility] OR;  Service: Vascular;  Laterality: Right;    Family History  Problem Relation Age of Onset   Cancer Mother        colon Cancer   Hyperlipidemia Mother    Diabetes Mother    Heart disease Father    Hypertension Father    Cancer Sister        Uterine   Diabetes Sister    Diabetes Brother     Social History   Socioeconomic History   Marital status: Widowed    Spouse name: Not on file   Number of children: 3   Years of education: 14   Highest education level: Some college, no degree  Occupational History   Occupation: Disable  Tobacco Use   Smoking status: Some Days    Types: Cigars    Passive exposure: Current   Smokeless tobacco: Never  Vaping Use   Vaping status: Never Used  Substance and Sexual Activity   Alcohol use: Yes    Alcohol/week: 2.0 standard drinks of alcohol    Types: 2 Cans of beer per week   Drug use: No   Sexual activity: Yes  Other Topics Concern   Not on file  Social History Narrative   Lives alone. Lives in an apartment. Ground floor apartment. Smoke alarms present, no  grab bars.    Has a shih-tzu, named Missy.    Patient eats a variety of foods. Drinks tea, Kool-aid, coffee      Use public transportation or brother will take places.   Some food insecurity.      Social Determinants of Health   Financial Resource Strain: Low Risk  (02/10/2023)   Overall Financial Resource Strain (CARDIA)    Difficulty of Paying Living Expenses: Not hard at all  Food Insecurity: No Food Insecurity (08/09/2023)   Hunger Vital Sign    Worried About Running Out of Food in the Last Year: Never true    Ran Out of Food in the Last Year: Never true  Transportation Needs: Unmet Transportation Needs (08/09/2023)   PRAPARE - Administrator, Civil Service (Medical): Yes    Lack of Transportation (  Non-Medical): Yes  Physical Activity: Insufficiently Active (02/10/2023)   Exercise Vital Sign    Days of Exercise per Week: 7 days    Minutes of Exercise per Session: 10 min  Stress: No Stress Concern Present (02/10/2023)   [employer] of Occupational Health - Occupational Stress Questionnaire    Feeling of Stress : Not at all  Social Connections: Moderately Isolated (02/10/2023)   Social Connection and Isolation Panel [NHANES]    Frequency of Communication with Friends and Family: More than three times a week    Frequency of Social Gatherings with Friends and Family: Never    Attends Religious Services: Never    Database administrator or Organizations: No    Attends Banker Meetings: Never    Marital Status: Living with partner  Intimate Partner Violence: Not At Risk (08/09/2023)   Humiliation, Afraid, Rape, and Kick questionnaire    Fear of Current or Ex-Partner: No    Emotionally Abused: No    Physically Abused: No    Sexually Abused: No    Allergies  Allergen Reactions   Glipizide Other (See Comments)    REACTION IS SIDE EFFECT Severe hypoglycemia to 40s.     Current Facility-Administered Medications  Medication Dose Route Frequency  Provider Last Rate Last Admin   piperacillin-tazobactam (ZOSYN) IVPB 3.375 g  3.375 g Intravenous Once Daylene Posey, RPH 100 mL/hr at 09/21/23 1643 3.375 g at 09/21/23 1643   vancomycin (VANCOREADY) IVPB 1500 mg/300 mL  1,500 mg Intravenous Once Daylene Posey, [facility]       Current Outpatient Medications  Medication Sig Dispense Refill   acetaminophen (TYLENOL) 325 MG tablet Take 1-2 tablets (325-650 mg total) by mouth every 4 (four) hours as needed for mild pain (or temp >/= 101 F).     albuterol (VENTOLIN HFA) 108 (90 Base) MCG/ACT inhaler TAKE 2 PUFFS BY MOUTH EVERY 6 HOURS AS NEEDED FOR WHEEZE OR SHORTNESS OF BREATH 8.5 each 1   ascorbic acid (VITAMIN C) 500 MG tablet Take 1 tablet (500 mg total) by mouth 2 (two) times daily. 100 tablet 0   aspirin EC 81 MG tablet Take 1 tablet (81 mg total) by mouth daily. Swallow whole.     atorvastatin (LIPITOR) 40 MG tablet Take 1 tablet (40 mg total) by mouth daily. 30 tablet 0   Baclofen 5 MG TABS Take 1 tablet (5 mg total) by mouth 3 (three) times daily. 90 each 0   clopidogrel (PLAVIX) 75 MG tablet Take 1 tablet (75 mg total) by mouth daily. 30 tablet 0   docusate sodium (COLACE) 100 MG capsule Take 1 capsule (100 mg total) by mouth daily.     ezetimibe (ZETIA) 10 MG tablet Take 1 tablet (10 mg total) by mouth daily. 30 tablet 0   FLUoxetine (PROZAC) 10 MG capsule Take 1 capsule (10 mg total) by mouth daily. 30 capsule 0   gabapentin (NEURONTIN) 300 MG capsule Take 2 capsules (600 mg total) by mouth 2 (two) times daily. 120 capsule 0   melatonin 3 MG TABS tablet Take 2 tablets (6 mg total) by mouth at bedtime. 60 tablet 0   metFORMIN (GLUCOPHAGE) 1000 MG tablet Take 1 tablet (1,000 mg total) by mouth 2 (two) times daily with a meal. 60 tablet 0   midodrine (PROAMATINE) 5 MG tablet Take 1 tablet (5 mg total) by mouth 3 (three) times daily with meals. 90 tablet 0   nebivolol (BYSTOLIC) 10 MG tablet Take 1 tablet (10 mg  total) by mouth daily. 30 tablet  0   nicotine polacrilex (NICORETTE) 2 MG gum Take 1 each (2 mg total) by mouth as needed for smoking cessation. 100 tablet 3   Oxycodone HCl 10 MG TABS Take 1 tablet (10 mg total) by mouth every 4 (four) hours as needed for severe pain. 30 tablet 0   pantoprazole (PROTONIX) 40 MG tablet Take 1 tablet (40 mg total) by mouth daily. 30 tablet 0   polyethylene glycol powder (GLYCOLAX/MIRALAX) 17 GM/SCOOP powder Take 17 g by mouth daily. (Patient not taking: Reported on 08/08/2023) 500 g 0   Zinc Sulfate 220 (50 Zn) MG TABS Take 1 tablet (220 mg total) by mouth daily. 14 tablet 0    PHYSICAL EXAM Vitals:   09/21/23 1400 09/21/23 1449 09/21/23 1500 09/21/23 1649  BP: 96/76 112/80 123/82 122/74  Pulse: 86 82 79 80  Resp: 15 16 (!) 26 (!) 21  Temp:      TempSrc:      SpO2: 98% 100% 99% 100%  Weight:      Height:       Chronically ill man in no distress Regular rate and rhythm Unlabored breathing Left above-knee amputation Right common femoral to peroneal artery bypass incisions are well-healed.  There is dry gangrene to the second and third toes.  There is brisk Doppler flow in the peroneal artery at the ankle  PERTINENT LABORATORY AND RADIOLOGIC DATA  Most recent CBC    Latest Ref Rng & Units 09/21/2023    1:29 PM 08/17/2023    5:23 AM 08/16/2023    6:44 PM  CBC  WBC 4.0 - 10.5 K/uL 9.1  4.2  5.0   Hemoglobin 13.0 - 17.0 g/dL 96.0  9.4  9.3   Hematocrit 39.0 - 52.0 % 37.0  33.5  32.8   Platelets 150 - 400 K/uL 378  296  318      Most recent CMP    Latest Ref Rng & Units 09/21/2023    1:29 PM 08/17/2023    5:23 AM 08/16/2023    6:44 PM  CMP  Glucose 70 - 99 mg/dL 454  98    BUN 8 - 23 mg/dL 8  12    Creatinine 0.98 - 1.24 mg/dL 1.19  1.47  8.29   Sodium 135 - 145 mmol/L 138  137    Potassium 3.5 - 5.1 mmol/L 3.6  3.8    Chloride 98 - 111 mmol/L 101  98    CO2 22 - 32 mmol/L 22  29    Calcium 8.9 - 10.3 mg/dL 9.0  8.3    Total Protein 6.5 - 8.1 g/dL  6.3    Total Bilirubin  0.3 - 1.2 mg/dL  0.3    Alkaline Phos 38 - 126 U/L  52    AST 15 - 41 U/L  23    ALT 0 - 44 U/L  16      Renal function Estimated Creatinine Clearance: 84.6 mL/min (by C-G formula based on SCr of 0.74 mg/dL).  HbA1c, POC (controlled diabetic range) (%)  Date Value  09/05/2022 7.4 (A)   Hgb A1c MFr Bld (%)  Date Value  08/09/2023 6.4 (H)    LDL Chol Calc (NIH)  Date Value Ref Range Status  11/30/2020 37 0 - 99 mg/dL Final   LDL Cholesterol  Date Value Ref Range Status  08/10/2023 14 0 - 99 mg/dL Final    Comment:  Total Cholesterol/HDL:CHD Risk Coronary Heart Disease Risk Table                     Men   Women  1/2 Average Risk   3.4   3.3  Average Risk       5.0   4.4  2 X Average Risk   9.6   7.1  3 X Average Risk  23.4   11.0        Use the calculated Patient Ratio above and the CHD Risk Table to determine the patient's CHD Risk.        ATP III CLASSIFICATION (LDL):  <100     mg/dL   Optimal  409-811  mg/dL   Near or Above                    Optimal  130-159  mg/dL   Borderline  914-782  mg/dL   High  >956     mg/dL   Very High Performed at [facility], [address] [place], [place] [address]    Direct LDL  Date Value Ref Range Status  03/05/2015 29 mg/dL Final    Comment:    ATP III Classification (LDL):       < 100        mg/dL         Optimal      657 - 129     mg/dL         Near or Above Optimal      130 - 159     mg/dL         Borderline High      160 - 189     mg/dL         High       > 846        mg/dL         Very High        Herberta Pickron N. Lenell Antu, MD FACS [facility] Phone Number: [phone] 09/21/2023 5:01 PM   Total time spent on preparing this encounter including chart review, data review, collecting history, examining the patient, coordinating care for this established patient, 40 minutes.  Portions of this report may have been transcribed using voice recognition software.   Every effort has been made to ensure accuracy; however, inadvertent computerized transcription errors may still be present.

## 2023-09-21 NOTE — Progress Notes (Signed)
Subjective:  Patient ID: [name], male    DOB: 02-10-1951,  MRN: [id number]  Chief Complaint  Patient presents with   Wound Check    Gangrene to right foot 2nd and 3rd toe. Patient has been applying iodine to the area. He is not able to use the surgical shoe due to the pain.     72 y.o. male presents for follow-up on gangrene to the right foot second and third toes.  Patient has been applying Betadine.  He notes significant increasing pain in his right foot and has more swelling there as well.  He is in significant pain at this time and thinks the wounds are much worse than they were previously.  Patient has a history of prior left above-knee amputation  Past Medical History:  Diagnosis Date   Adhesive capsulitis 03/10/2020   Anemia    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   Critical lower limb ischemia (HCC) 12/19/2019   Diabetes mellitus    takes Metformin daily   GERD (gastroesophageal reflux disease)    takes Protonix daily   History of blood transfusion    "related to low HgB" ((09/10/2015   Hyperlipidemia    takes Vytorin daily   Hypertension    takes Benazepril and Bystolic daily   PAD (peripheral artery disease) (HCC)    Pneumonia    Radiculopathy, cervical region 02/11/2017   Shortness of breath dyspnea    with exertion   Tobacco user    Toe fracture, right 05/09/2011   Wears glasses     Allergies  Allergen Reactions   Glipizide Other (See Comments)    REACTION IS SIDE EFFECT Severe hypoglycemia to 40s.     ROS: Negative except as per HPI above  Objective:  General: AAO x3, NAD  Dermatological: Gangrenous edges with necrosis of the second and third toes on the right foot.  Tissue loss to the level of bone on the third toe.  Erythema and  edema noted in the right foot and ankle see clinical picture below  Vascular:  Dorsalis Pedis artery and Posterior Tibial nonpalpable and cap refill absent to the right foot digits  Neruologic: Grossly diminished via light touch  Musculoskeletal: Prior left above-knee amputation  Gait: Unassisted, Nonantalgic.     Radiographs:  Deferred as patient going to emergency department Assessment:   1. Gangrene of toe of right foot (HCC) [I96]   2. Critical limb ischemia of right lower extremity (HCC)   3. Cellulitis of right foot   4. S/P above knee amputation, left (HCC)   5. 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  # Right foot gangrene with evidence of cellulitis and critical limb ischemia on the right lower extremity -Discussed with patient the nature of his severe peripheral arterial disease and critical limb ischemia -Believe he has increasing pain in the right foot and ankle due to worsening of the arterial flow in the extremity and this is also hastening the progression of his gangrenous changes to the digits on the right foot -Explained that patient is likely to need at least transmetatarsal amputation versus a proximal limb amputation on  the right lower extremity -Given the concern for cellulitis and worsening infection I recommend the patient proceed to [facility] emergency department for evaluation for IV antibiotics, vascular consultation and ABI PVR testing, MRI of the right foot with likely plans for surgical intervention this admission -Patient is at extremely high risk for proximal amputation of the right lower extremity and I discussed this with him and he is aware -Patient states he will proceed to [facility] later today and I will follow in consultation tomorrow          Corinna Gab, DPM [facility] / [facility]

## 2023-09-21 NOTE — ED Provider Notes (Signed)
[place] EMERGENCY DEPARTMENT AT [facility] Provider Note   CSN: [id number] Arrival date & time: 09/21/23  1310     History  No chief complaint on file.   [name] is a 72 y.o. male.  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  He reports he noticed a wound to the second toe approximately 2 weeks ago.  States his symptoms have progressively gotten worse.  He reports pain to the entire right foot.  He denies any fevers or chills.  No nausea or vomiting.  Chart review shows patient presented to [facility] and was seen by Dr. Annamary Rummage who sent patient to the ED for vascular surgery consult due to nonpalpable DP and PT pulses.  HPI     Home Medications Prior to Admission medications   Medication Sig Start Date End Date Taking? Authorizing Provider  acetaminophen (TYLENOL) 325 MG tablet Take 1-2 tablets (325-650 mg total) by mouth every 4 (four) hours as needed for mild pain (or temp >/= 101 F). 08/23/23   Angiulli, Mcarthur Rossetti, PA-C  albuterol (VENTOLIN HFA) 108 (90 Base) MCG/ACT inhaler TAKE 2 PUFFS BY MOUTH EVERY 6 HOURS AS NEEDED FOR WHEEZE OR SHORTNESS OF BREATH 09/14/23   Erick Alley, DO  ascorbic acid (VITAMIN C) 500 MG tablet Take 1 tablet (500 mg total) by mouth 2 (two) times daily. 08/23/23   Angiulli, Mcarthur Rossetti, PA-C  aspirin EC 81 MG tablet Take 1 tablet (81 mg total) by mouth daily. Swallow whole. 07/27/23 07/26/24  Cephus Shelling, MD  atorvastatin (LIPITOR) 40 MG tablet Take 1 tablet (40 mg total) by mouth daily. 08/23/23   Angiulli, Mcarthur Rossetti, PA-C  Baclofen 5 MG TABS Take 1 tablet (5 mg total) by mouth 3 (three) times daily. 08/23/23   Angiulli, Mcarthur Rossetti, PA-C  clopidogrel (PLAVIX) 75 MG tablet Take 1 tablet (75 mg total)  by mouth daily. 08/23/23   Angiulli, Mcarthur Rossetti, PA-C  docusate sodium (COLACE) 100 MG capsule Take 1 capsule (100 mg total) by mouth daily. 08/23/23   Angiulli, Mcarthur Rossetti, PA-C  ezetimibe (ZETIA) 10 MG tablet Take 1 tablet (10 mg total) by mouth daily. 08/23/23   Angiulli, Mcarthur Rossetti, PA-C  FLUoxetine (PROZAC) 10 MG capsule Take 1 capsule (10 mg total) by mouth daily. 08/23/23   Angiulli, Mcarthur Rossetti, PA-C  gabapentin (NEURONTIN) 300 MG capsule Take 2 capsules (600 mg total) by mouth 2 (two) times daily. 08/23/23   Angiulli, Mcarthur Rossetti, PA-C  melatonin 3 MG TABS tablet Take 2 tablets (6 mg total) by mouth at bedtime. 08/23/23   Angiulli, Mcarthur Rossetti, PA-C  metFORMIN (GLUCOPHAGE) 1000 MG tablet Take 1 tablet (1,000 mg total) by mouth 2 (two) times daily with a meal. 08/23/23   Angiulli, Mcarthur Rossetti, PA-C  midodrine (PROAMATINE) 5 MG tablet Take 1 tablet (5 mg total) by mouth 3 (three) times daily with meals. 08/23/23   Angiulli, Mcarthur Rossetti, PA-C  nebivolol (BYSTOLIC) 10 MG tablet Take 1 tablet (10 mg total) by mouth daily. 08/23/23   Angiulli, Mcarthur Rossetti, PA-C  nicotine polacrilex (NICORETTE) 2 MG gum Take 1 each (2 mg total) by mouth as needed for smoking cessation. 09/06/22   Westley Chandler, MD  Oxycodone HCl 10 MG TABS Take 1 tablet (10 mg  total) by mouth every 4 (four) hours as needed for severe pain. 08/23/23   Angiulli, Mcarthur Rossetti, PA-C  pantoprazole (PROTONIX) 40 MG tablet Take 1 tablet (40 mg total) by mouth daily. 08/23/23   Angiulli, Mcarthur Rossetti, PA-C  polyethylene glycol powder (GLYCOLAX/MIRALAX) 17 GM/SCOOP powder Take 17 g by mouth daily. Patient not taking: Reported on 08/08/2023 02/23/23   Littie Deeds, MD  Zinc Sulfate 220 (50 Zn) MG TABS Take 1 tablet (220 mg total) by mouth daily. 08/23/23   Genice Rouge, MD      Allergies    Glipizide    Review of Systems   Review of Systems  Musculoskeletal:  Positive for arthralgias.  Skin:  Positive for color change and wound.  All other systems reviewed and are  negative.   Physical Exam Updated Vital Signs BP 112/80   Pulse 82   Temp (!) 97.3 F (36.3 C) (Oral)   Resp 16   Ht 5\' 7"  (1.702 m)   Wt 82.6 kg   SpO2 100%   BMI 28.52 kg/m  Physical Exam Vitals and nursing note reviewed.  Constitutional:      General: He is not in acute distress.    Appearance: Normal appearance. He is normal weight. He is not ill-appearing.     Comments: Resting comfortably in bed  HENT:     Head: Normocephalic and atraumatic.  Pulmonary:     Effort: Pulmonary effort is normal. No respiratory distress.  Abdominal:     General: Abdomen is flat.  Musculoskeletal:        General: Normal range of motion.     Cervical back: Neck supple.     Comments: Left above-the-knee amputation  Feet:     Comments: Necrotic and purulent ulcer to the dorsum of the second and third digits of the right foot.  Nonpalpable DP and PT pulses.  Capillary refill not present.  He is able to wiggle his toes.  Minimal sensation.  No crepitus. Skin:    General: Skin is warm and dry.  Neurological:     Mental Status: He is alert and oriented to person, place, and time.  Psychiatric:        Mood and Affect: Mood normal.        Behavior: Behavior normal.     ED Results / Procedures / Treatments   Labs (all labs ordered are listed, but only abnormal results are displayed) Labs Reviewed  BASIC METABOLIC PANEL - Abnormal; Notable for the following components:      Result Value   Glucose, Bld 167 (*)    All other components within normal limits  CBC WITH DIFFERENTIAL/PLATELET - Abnormal; Notable for the following components:   Hemoglobin 10.8 (*)    HCT 37.0 (*)    MCV 71.2 (*)    MCH 20.8 (*)    MCHC 29.2 (*)    RDW 23.9 (*)    All other components within normal limits  I-STAT CG4 LACTIC ACID, ED - Abnormal; Notable for the following components:   Lactic Acid, Venous 2.3 (*)    All other components within normal limits  CULTURE, BLOOD (ROUTINE X 2)  CULTURE, BLOOD  (ROUTINE X 2)    EKG None  Radiology No results found.  Procedures Procedures    Medications Ordered in ED Medications  vancomycin (VANCOREADY) IVPB 1500 mg/300 mL (has no administration in time range)  piperacillin-tazobactam (ZOSYN) IVPB 3.375 g (has no administration in time range)  sodium chloride 0.9 % bolus  1,000 mL (has no administration in time range)  morphine (PF) 4 MG/ML injection 4 mg (4 mg Intravenous Given 09/21/23 1458)    ED Course/ Medical Decision Making/ A&P Clinical Course as of 09/21/23 1549  Thu Sep 21, 2023  1437 Spoke with vascular surgery who will evaluate patient at bedside [AS]  1549 Podiatry Dr. Lajoyce Corners does not appreciate any subcutaneous gas on x-ray imaging [AS]    Clinical Course User Index [AS] Lula Olszewski Edsel Petrin, PA-C                                 Medical Decision Making Amount and/or Complexity of Data Reviewed Labs: ordered. Radiology: ordered.  Risk Prescription drug management.   This patient presents to the ED for concern of right foot wound, this involves an extensive number of treatment options, and is a complaint that carries with it a high risk of complications and morbidity.  The differential diagnosis includes gangrene, osteomyelitis, ischemic limb  My initial workup includes labs, imaging, vascular surgery consult  Additional history obtained from: Nursing notes from this visit. Previous records within EMR system office visit with [facility] with Dr. Annamary Rummage who recommends patient present to the ED, vascular consult, IV antibiotics  I ordered, reviewed and interpreted labs which include: CBC without leukocytosis, stable anemia with a hemoglobin of 10.8, no electrolyte derangements, hyperglycemia of 167.  Lactic acidosis of 2.3  I ordered imaging studies including x-ray right foot, MRI right foot I independently visualized and interpreted imaging which showed pending at shift change  Consultations  Obtained:  I requested consultation with vascular surgery Dr. Lenell Antu,  and discussed lab and imaging findings as well as pertinent plan - they recommend: They will evaluate patient at bedside  I requested consultation with podiatry Dr. Annamary Rummage.  He recommends checking ABI, admit to hospitalist, IV antibiotics, vascular surgery consult  Afebrile, hemodynamically stable.  72 year old male presenting for evaluation of right lower extremity wound.  He was sent from podiatry for admission and vascular surgery consult.  Vascular surgery has been consulted and will evaluate patient at bedside.  Lab workup initiated which is without leukocytosis or significant anemia but does have mildly elevated lactic acidosis of 2.3.  Broad-spectrum antibiotics were initiated per podiatry recommendation.  Care will be handed off to oncoming provider pending hospitalist consult.  Patient will need admission for surgical intervention.  Podiatry will evaluate but states that he will likely not undergo surgery until 2 days from now.  Note: Portions of this report may have been transcribed using voice recognition software. Every effort was made to ensure accuracy; however, inadvertent computerized transcription errors may still be present.        Final Clinical Impression(s) / ED Diagnoses Final diagnoses:  Gangrene of right foot [facility])    Rx / DC Orders ED Discharge Orders     None         Michelle Piper, PA-C 09/21/23 1549    Coral Spikes, DO 09/21/23 1604

## 2023-09-21 NOTE — Plan of Care (Signed)
Problem: Education: Goal: Understanding of CV disease, CV risk reduction, and recovery process will improve Outcome: Progressing Goal: Individualized Educational Video(s) Outcome: Progressing   Problem: Activity: Goal: Ability to return to baseline activity level will improve Outcome: Progressing   Problem: Cardiovascular: Goal: Ability to achieve and maintain adequate cardiovascular perfusion will improve Outcome: Progressing Goal: Vascular access site(s) Level 0-1 will be maintained Outcome: Progressing   Problem: Health Behavior/Discharge Planning: Goal: Ability to safely manage health-related needs after discharge will improve Outcome: Progressing   Problem: Education: Goal: Knowledge of prescribed regimen will improve Outcome: Progressing   Problem: Activity: Goal: Ability to tolerate increased activity will improve Outcome: Progressing   Problem: Bowel/Gastric: Goal: Gastrointestinal status for postoperative course will improve Outcome: Progressing   Problem: Clinical Measurements: Goal: Postoperative complications will be avoided or minimized Outcome: Progressing Goal: Signs and symptoms of graft occlusion will improve Outcome: Progressing   Problem: Skin Integrity: Goal: Demonstration of wound healing without infection will improve Outcome: Progressing   Problem: Education: Goal: Ability to describe self-care measures that may prevent or decrease complications (Diabetes Survival Skills Education) will improve Outcome: Progressing Goal: Individualized Educational Video(s) Outcome: Progressing   Problem: Coping: Goal: Ability to adjust to condition or change in health will improve Outcome: Progressing   Problem: Fluid Volume: Goal: Ability to maintain a balanced intake and output will improve Outcome: Progressing   Problem: Health Behavior/Discharge Planning: Goal: Ability to identify and utilize available resources and services will improve Outcome:  Progressing Goal: Ability to manage health-related needs will improve Outcome: Progressing   Problem: Metabolic: Goal: Ability to maintain appropriate glucose levels will improve Outcome: Progressing   Problem: Nutritional: Goal: Maintenance of adequate nutrition will improve Outcome: Progressing Goal: Progress toward achieving an optimal weight will improve Outcome: Progressing   Problem: Skin Integrity: Goal: Risk for impaired skin integrity will decrease Outcome: Progressing   Problem: Tissue Perfusion: Goal: Adequacy of tissue perfusion will improve Outcome: Progressing   Problem: Education: Goal: Knowledge of General Education information will improve Description: Including pain rating scale, medication(s)/side effects and non-pharmacologic comfort measures Outcome: Progressing   Problem: Health Behavior/Discharge Planning: Goal: Ability to manage health-related needs will improve Outcome: Progressing   Problem: Clinical Measurements: Goal: Ability to maintain clinical measurements within normal limits will improve Outcome: Progressing Goal: Will remain free from infection Outcome: Progressing Goal: Diagnostic test results will improve Outcome: Progressing Goal: Respiratory complications will improve Outcome: Progressing Goal: Cardiovascular complication will be avoided Outcome: Progressing   Problem: Activity: Goal: Risk for activity intolerance will decrease Outcome: Progressing   Problem: Nutrition: Goal: Adequate nutrition will be maintained Outcome: Progressing   Problem: Coping: Goal: Level of anxiety will decrease Outcome: Progressing   Problem: Elimination: Goal: Will not experience complications related to bowel motility Outcome: Progressing Goal: Will not experience complications related to urinary retention Outcome: Progressing   Problem: Pain Managment: Goal: General experience of comfort will improve Outcome: Progressing   Problem:  Safety: Goal: Ability to remain free from injury will improve Outcome: Progressing   Problem: Skin Integrity: Goal: Risk for impaired skin integrity will decrease Outcome: Progressing

## 2023-09-21 NOTE — ED Notes (Signed)
Patient transported to MRI 

## 2023-09-22 ENCOUNTER — Inpatient Hospital Stay (HOSPITAL_COMMUNITY): Payer: Medicare HMO

## 2023-09-22 DIAGNOSIS — I96 Gangrene, not elsewhere classified: Secondary | ICD-10-CM

## 2023-09-22 LAB — CBC
HCT: 35 % — ABNORMAL LOW (ref 39.0–52.0)
Hemoglobin: 10.1 g/dL — ABNORMAL LOW (ref 13.0–17.0)
MCH: 20.7 pg — ABNORMAL LOW (ref 26.0–34.0)
MCHC: 28.9 g/dL — ABNORMAL LOW (ref 30.0–36.0)
MCV: 71.9 fL — ABNORMAL LOW (ref 80.0–100.0)
Platelets: 366 10*3/uL (ref 150–400)
RBC: 4.87 MIL/uL (ref 4.22–5.81)
RDW: 23.7 % — ABNORMAL HIGH (ref 11.5–15.5)
WBC: 8.9 10*3/uL (ref 4.0–10.5)
nRBC: 0 % (ref 0.0–0.2)

## 2023-09-22 LAB — VAS US ABI WITH/WO TBI: Right ABI: 0.42

## 2023-09-22 LAB — BASIC METABOLIC PANEL
Anion gap: 9 (ref 5–15)
BUN: 8 mg/dL (ref 8–23)
CO2: 24 mmol/L (ref 22–32)
Calcium: 8.3 mg/dL — ABNORMAL LOW (ref 8.9–10.3)
Chloride: 104 mmol/L (ref 98–111)
Creatinine, Ser: 0.67 mg/dL (ref 0.61–1.24)
GFR, Estimated: >60 mL/min (ref >60)
Glucose, Bld: 133 mg/dL — ABNORMAL HIGH (ref 70–99)
Potassium: 3.8 mmol/L (ref 3.5–5.1)
Sodium: 137 mmol/L (ref 135–145)

## 2023-09-22 LAB — GLUCOSE, CAPILLARY
Glucose-Capillary: 111 mg/dL — ABNORMAL HIGH (ref 70–99)
Glucose-Capillary: 107 mg/dL — ABNORMAL HIGH (ref 70–99)
Glucose-Capillary: 135 mg/dL — ABNORMAL HIGH (ref 70–99)
Glucose-Capillary: 88 mg/dL (ref 70–99)
Glucose-Capillary: 123 mg/dL — ABNORMAL HIGH (ref 70–99)
Glucose-Capillary: 150 mg/dL — ABNORMAL HIGH (ref 70–99)

## 2023-09-22 LAB — LACTIC ACID, PLASMA: Lactic Acid, Venous: 1.5 mmol/L (ref 0.5–1.9)

## 2023-09-22 MED ORDER — OXYCODONE HCL 5 MG PO TABS: 10.0000 mg | ORAL_TABLET | ORAL | Status: DC | PRN

## 2023-09-22 MED ORDER — OXYCODONE HCL 5 MG PO TABS
5.0000 mg | ORAL_TABLET | ORAL | Status: DC | PRN
  Administered 2023-09-22 – 2023-09-23 (×4): 5 mg via ORAL
  Filled 2023-09-22 (×4): qty 1

## 2023-09-22 MED ORDER — OXYCODONE HCL 5 MG PO TABS
5.0000 mg | ORAL_TABLET | Freq: Once | ORAL | Status: AC
  Administered 2023-09-22: 5 mg via ORAL
  Filled 2023-09-22: qty 1

## 2023-09-22 MED ORDER — HEPARIN (PORCINE) 25000 UT/250ML-% IV SOLN
1400.0000 [IU]/h | INTRAVENOUS | Status: AC
  Administered 2023-09-22: 1300 [IU]/h via INTRAVENOUS
  Administered 2023-09-23 – 2023-09-24 (×3): 1400 [IU]/h via INTRAVENOUS
  Filled 2023-09-22 (×4): qty 250

## 2023-09-22 NOTE — Consult Note (Addendum)
PODIATRY CONSULTATION  NAME [name] MRN [id number] DOB 06/20/1951 DOA 09/21/2023   Reason for consult: Gangrene of right foot  Attending/Consulting physician: Signe Colt MD  History of present illness: 72 y.o. 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  Patient with increased pain and color changes x 2 weeks.  Sent to ED yesterday by myself for evaluation by vascular surgery and eventual trans met amputation vs proximal limb amputation of the right lower extremity.  No sign of systemic infection currently, afebrile, no leukocytosis, wound w/o purulence.   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   Critical lower limb ischemia (HCC) 12/19/2019   Diabetes mellitus    takes Metformin daily   GERD (gastroesophageal reflux disease)    takes Protonix daily   History of blood transfusion    "related to low HgB" ((09/10/2015   Hyperlipidemia    takes Vytorin daily   Hypertension    takes Benazepril and Bystolic daily   PAD (peripheral artery disease) (HCC)    Pneumonia    Radiculopathy, cervical region 02/11/2017   Shortness of breath dyspnea    with exertion   Tobacco user    Toe fracture, right 05/09/2011   Wears glasses        Latest Ref Rng & Units 09/22/2023    1:11 AM 09/21/2023    1:29 PM 08/17/2023    5:23 AM  CBC  WBC 4.0 - 10.5 K/uL 8.9  9.1  4.2   Hemoglobin 13.0 - 17.0 g/dL 38.7  56.4  9.4   Hematocrit 39.0 - 52.0 % 35.0  37.0  33.5   Platelets 150 - 400 K/uL 366  378  296        Latest Ref Rng & Units 09/22/2023    1:11  AM 09/21/2023    1:29 PM 08/17/2023    5:23 AM  BMP  Glucose 70 - 99 mg/dL 332  951  98   BUN 8 - 23 mg/dL 8  8  12    Creatinine 0.61 - 1.24 mg/dL 8.84  1.66  0.63   Sodium 135 - 145 mmol/L 137  138  137   Potassium 3.5 - 5.1 mmol/L 3.8  3.6  3.8   Chloride 98 - 111 mmol/L 104  101  98   CO2 22 - 32 mmol/L 24  22  29    Calcium 8.9 - 10.3 mg/dL 8.3  9.0  8.3       Physical Exam: Lower Extremity Exam Vasc: R - PT non palpable, DP non palpable. Cap refill absent    Derm: R - Gangrenous changes to the right forefoot with exposed bone in the 3rd toe. Malodor present, cellulitis of right foot     MSK:  R - Edema of RLE  L - S/p L AKA  Neuro: R - Gross sensation absent. Gross motor function intact       ASSESSMENT/PLAN OF CARE 72 y.o. 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   AF VSS WBC 8.9 MRI R Foot 9/26 OM of the phalanges of 3rd toe, mid and distal phalanx 2nd toe, distal phalanx hallux  ABI PVR: Resting right ankle-brachial index indicates severe right lower extremity arterial disease.  Right great toe not obtainable due to flattened waveforms.  Art duplex: Right: 50-74% stenosis noted in the common femoral artery. Low velocities  in the graft could suggest a threatened bypass.    - Patient will require surgical intervention this admission either transmetatarsal amputation vs BKA/AKA. This will depend on results of angiogram planned by vascular for 9/30. Defer podiatric surgical intervention until after that procedure and further discussion with vascular. - Appreciate vascular recs - Abx per primary, no acute need for IV abx - Anticoagulation: per primary, heparin - Wound care: Betadine paint to right forefoot daily per  RN - WB status: WBAT to right foot in post op shoe - Will continue to follow   Thank you for the consult.  Please contact me directly with any questions or concerns.           Corinna Gab, DPM [facility] / [facility]    [address] [place], [place] [address]                Office [phone]  Fax [phone]

## 2023-09-22 NOTE — Assessment & Plan Note (Deleted)
Hemoglobin 10.8 on admission.  Appears to be chronically anemic with a baseline 9-10. -AM CBC -Consider iron studies and further evaluation if continues to decrease

## 2023-09-22 NOTE — Assessment & Plan Note (Signed)
>>  ASSESSMENT AND PLAN FOR ANEMIA WRITTEN ON 09/22/2023  3:14 PM BY [name], MD  Hemoglobin 10.8 > 10.1.  Patient appears to be chronically anemic with a baseline 9-10. - AM CBC - Consider iron studies and further evaluation if continues to decrease

## 2023-09-22 NOTE — Progress Notes (Addendum)
  Progress Note    09/22/2023 8:27 AM * No surgery date entered *  Subjective: Rest pain in right foot overnight.  More comfortable when foot is hanging off the bed   Vitals:   09/22/23 0450 09/22/23 0747  BP: (!) 149/88 97/63  Pulse: 74 79  Resp: 20   Temp: 98.8 F (37.1 C) 98.7 F (37.1 C)  SpO2: 100% 94%   Physical Exam: Lungs:  non labored Extremities:  gangrenous toes of R foot; foot warm to touch Neurologic: A&O  CBC    Component Value Date/Time   WBC 8.9 09/22/2023 0111   RBC 4.87 09/22/2023 0111   HGB 10.1 (L) 09/22/2023 0111   HGB 9.9 (L) 02/23/2023 1210   HCT 35.0 (L) 09/22/2023 0111   HCT 34.6 (L) 02/23/2023 1210   PLT 366 09/22/2023 0111   PLT 362 02/23/2023 1210   MCV 71.9 (L) 09/22/2023 0111   MCV 67 (L) 02/23/2023 1210   MCH 20.7 (L) 09/22/2023 0111   MCHC 28.9 (L) 09/22/2023 0111   RDW 23.7 (H) 09/22/2023 0111   RDW 18.3 (H) 02/23/2023 1210   LYMPHSABS 1.5 09/21/2023 1329   LYMPHSABS 1.7 03/03/2022 1615   MONOABS 0.7 09/21/2023 1329   EOSABS 0.2 09/21/2023 1329   EOSABS 0.2 03/03/2022 1615   BASOSABS 0.0 09/21/2023 1329   BASOSABS 0.1 03/03/2022 1615    BMET    Component Value Date/Time   NA 137 09/22/2023 0111   NA 136 02/23/2023 1210   K 3.8 09/22/2023 0111   CL 104 09/22/2023 0111   CO2 24 09/22/2023 0111   GLUCOSE 133 (H) 09/22/2023 0111   BUN 8 09/22/2023 0111   BUN 8 02/23/2023 1210   CREATININE 0.67 09/22/2023 0111   CREATININE 0.94 03/31/2015 1454   CALCIUM 8.3 (L) 09/22/2023 0111   GFRNONAA >60 09/22/2023 0111   GFRAA 106 11/30/2020 0944    INR    Component Value Date/Time   INR 1.2 08/09/2023 0700    No intake or output data in the 24 hours ending 09/22/23 0827   Assessment/Plan:  72 y.o. male with rest pain in the right foot; gangrenous toes  Right foot is warm to touch.  Patient however complained of rest pain overnight.  He feels more comfortable with his foot hanging off the bed.  He is scheduled for TMA  tomorrow with podiatry.  We will need to confirm femoral to peroneal bypass patency.  Duplex and ABI are pending.   Emilie Rutter, PA-C [facility] [phone] 09/22/2023 8:27 AM  VASCULAR STAFF ADDENDUM: I have independently interviewed and examined the patient. I agree with the above.  Pending ABI and right lower extremity duplex.  Would not proceed with TMA unless these were reassuring.  Rande Brunt. Lenell Antu, MD [facility] Phone Number: [phone] 09/22/2023 9:17 AM

## 2023-09-22 NOTE — Assessment & Plan Note (Addendum)
Takes metformin.  Last hemoglobin A1c 6.4 a month ago. - Sensitive SSI - CBG checks before meals and at bedtime - Carb modified diet

## 2023-09-22 NOTE — Assessment & Plan Note (Addendum)
Patient with increased pain and color changes of R toes x 2 weeks. MRI of R foot with osteomyelitis of great, second, and third toes. Patient remains afebrile without leukocytosis, s/p 1x Zosyn and Vanc. Patient feels pain has remained consistent since admission. VSS and Podiatry following. - Greatly appreciate VSS and Podiatry recs: - RLE angiogram planned for 9/30 given significant R ABI and threatened bypass - Amputation to follow - No further abx at this time given clinical stability  - Oxycodone 5 q6 PRN for pain

## 2023-09-22 NOTE — Progress Notes (Cosign Needed Addendum)
Daily Progress Note Intern Pager: [phone]  Patient name: [name] Medical record number: [id number] Date of birth: 1951/05/19 Age: 72 y.o. Gender: male  Primary Care Provider: Erick Alley, DO Consultants: VSS, Podiatry Code Status: Full  Pt Overview and Major Events to Date:  9/26: Admitted to FMTS  Assessment and Plan: Roser is a 72 year old male with history of PAD and DM neuropathy presenting from Podiatry for gangrene of right toes. VSS and Podiatry following. Given significant R ABI and threatened R leg bypass, plan for LE angiogram (9/30) before proceeding with amputation.  Assessment & Plan Gangrene of toe of right foot (HCC) Patient with increased pain and color changes of R toes x 2 weeks. MRI of R foot with osteomyelitis of great, second, and third toes. Patient remains afebrile without leukocytosis, s/p 1x Zosyn and Vanc. Patient feels pain has remained consistent since admission. VSS and Podiatry following. - Greatly appreciate VSS and Podiatry recs: - RLE angiogram planned for 9/30 given significant R ABI and threatened bypass - Amputation to follow - No further abx at this time given clinical stability  - Oxycodone 5 q6 PRN for pain Anemia Hemoglobin 10.8 > 10.1.  Patient appears to be chronically anemic with a baseline 9-10. 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  Last hemoglobin A1c 6.4 a month ago. - Sensitive SSI - CBG checks before meals and at bedtime - Carb modified diet  Chronic and Stable Issues: HTN - continue amlodipine 5mg  every day, nebivolol 10mg  QD HLD - continue atorvastatin 40mg  QD, zetia 10mg  every day Diabetic neuropathy - continue gabapentin 600mg  BID Mood - continue fluoxetine 10 QD  FEN/GI: Carb modified diet PPx: Heparin q8 Dispo: Home pending surgical management of R foot   Subjective:   Patient continues to have 8/10 constant pain in his right foot and toes.  He has some lower back and bottom soreness, would like an air mattress.  No chest pain, difficulty breathing, headache, or vision changes.  Eating without issue.  No nausea, vomiting, diarrhea.  Objective: Temp:  [97.3 F (36.3 C)-98.8 F (37.1 C)] 98.8 F (37.1 C) (09/27 0450) Pulse Rate:  [72-90] 74 (09/27 0450) Resp:  [14-26] 20 (09/27 0450) BP: (95-182)/(67-88) 149/88 (09/27 0450) SpO2:  [95 %-100 %] 100 % (09/27 0450) Weight:  [82.6 kg] 82.6 kg (09/26 1325) Physical Exam: General: Well- appearing, no acute distress Cardiovascular: Normal S1/S2. No extra heart sounds. Respiratory: Breathing comfortably on room air. Appropriate air movement throughout. No increased work of breathing. Abdomen: Soft, nontender, nondistended.  Extremities: Gangrenous 2nd and 3rd R toes. Very tender to light touch of R toes and foot to the level of the ankle.   Laboratory: Most recent CBC Lab Results  Component Value Date   WBC 8.9 09/22/2023   HGB 10.1 (L) 09/22/2023   HCT 35.0 (L) 09/22/2023   MCV 71.9 (L) 09/22/2023   PLT 366 09/22/2023   Most recent BMP    Latest Ref Rng & Units 09/22/2023    1:11 AM  BMP  Glucose 70 - 99 mg/dL 147   BUN 8 - 23 mg/dL 8   Creatinine 8.29 - 5.62 mg/dL 1.30   Sodium 865 - 784 mmol/L 137   Potassium 3.5 - 5.1 mmol/L 3.8   Chloride 98 - 111 mmol/L 104   CO2 22 - 32 mmol/L 24   Calcium  8.9 - 10.3 mg/dL 8.3    Imaging/Diagnostic Tests: R ABI: 0.42 R LE Art [place]: 50-74% stenosis noted in the common femoral artery. Low velocities of graft suggesting threatened bypass.   [name], MD 09/22/2023, 7:18 AM  PGY-1, [facility] FPTS Intern pager: [phone], text pages welcome Secure chat group [facility] Teaching Service

## 2023-09-22 NOTE — Progress Notes (Signed)
Right ABI and LEA duplex completed.

## 2023-09-22 NOTE — Progress Notes (Signed)
I have reviewed Mr. Geiman noninvasive imaging that shows threatened right leg bypass with very sluggish flow with velocities in the teens and 20s.  His right leg bypass is patent.  I have recommended lower extremity angiogram with possible intervention to maintain patency of his bypass.  Will schedule for Monday in the Cath Lab with Dr. Randie Heinz.  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  Please keep n.p.o. Sunday night.  We will follow.  Cephus Shelling, MD [facility] Office: [phone]   Cephus Shelling

## 2023-09-22 NOTE — Progress Notes (Signed)
ANTICOAGULATION CONSULT NOTE  Pharmacy Consult for Heparin Indication:  threatened bypass  Allergies  Allergen Reactions   Glipizide Other (See Comments)    REACTION IS SIDE EFFECT Severe hypoglycemia to 40s.     Patient Measurements: Height: 5\' 7"  (170.2 cm) Weight: 82.6 kg (182 lb 1.6 oz) IBW/kg (Calculated) : 66.1 Heparin Dosing Weight: 83 kg  Vital Signs: Temp: 98.7 F (37.1 C) (09/27 0747) Temp Source: Oral (09/27 0747) BP: 97/63 (09/27 0747) Pulse Rate: 79 (09/27 0747)  Labs: Recent Labs    09/21/23 1329 09/22/23 0111  HGB 10.8* 10.1*  HCT 37.0* 35.0*  PLT 378 366  CREATININE 0.74 0.67    Estimated Creatinine Clearance: 84.6 mL/min (by C-G formula based on SCr of 0.67 mg/dL).   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 male with history of PAD not on anticoagulation PTA who is admitted for gangrene of right toes. VVS following, imaging shows threatened right leg bypass with very sluggish flow.  Currently has lower extremity angiogram with possible intervention to maintain patency of his bypass  scheduled for Monday. Pharmacy consulted to manage heparin.   Previously on SQ heparin for VTE ppx, last dose ~1400. CBC stable.   Goal of Therapy:  Heparin level 0.3-0.7 units/ml Monitor platelets by anticoagulation protocol: Yes   Plan:  Start heparin infusion at 1300 units/hr Check heparin level in 8 hours and daily while on heparin Continue to monitor H&H and platelets  Thank you for allowing pharmacy to be a part of this patient's care.  Thelma Barge, PharmD Clinical Pharmacist

## 2023-09-22 NOTE — Assessment & Plan Note (Signed)
Hemoglobin 10.8 > 10.1.  Patient appears to be chronically anemic with a baseline 9-10. - AM CBC - Consider iron studies and further evaluation if continues to decrease

## 2023-09-23 ENCOUNTER — Encounter (HOSPITAL_COMMUNITY)
Admission: EM | Disposition: A | Discharge status: Skilled Nursing Facility | Payer: Self-pay | Source: Ambulatory Visit | Attending: Family Medicine

## 2023-09-23 DIAGNOSIS — I96 Gangrene, not elsewhere classified: Secondary | ICD-10-CM | POA: Diagnosis not present

## 2023-09-23 LAB — GLUCOSE, CAPILLARY
Glucose-Capillary: 119 mg/dL — ABNORMAL HIGH (ref 70–99)
Glucose-Capillary: 147 mg/dL — ABNORMAL HIGH (ref 70–99)
Glucose-Capillary: 167 mg/dL — ABNORMAL HIGH (ref 70–99)

## 2023-09-23 LAB — HEPARIN LEVEL (UNFRACTIONATED): Heparin Unfractionated: 0.46 [IU]/mL (ref 0.30–0.70)

## 2023-09-23 LAB — BASIC METABOLIC PANEL
Anion gap: 9 (ref 5–15)
BUN: 8 mg/dL (ref 8–23)
CO2: 27 mmol/L (ref 22–32)
Calcium: 8.9 mg/dL (ref 8.9–10.3)
Chloride: 97 mmol/L — ABNORMAL LOW (ref 98–111)
Creatinine, Ser: 0.51 mg/dL — ABNORMAL LOW (ref 0.61–1.24)
GFR, Estimated: >60 mL/min (ref >60)
Glucose, Bld: 129 mg/dL — ABNORMAL HIGH (ref 70–99)
Potassium: 4.4 mmol/L (ref 3.5–5.1)
Sodium: 133 mmol/L — ABNORMAL LOW (ref 135–145)

## 2023-09-23 SURGERY — AMPUTATION, FOOT, TRANSMETATARSAL
Anesthesia: Choice | Site: Toe | Laterality: Right

## 2023-09-23 MED ORDER — OXYCODONE HCL 5 MG PO TABS
10.0000 mg | ORAL_TABLET | ORAL | Status: DC | PRN
  Administered 2023-09-23 – 2023-09-24 (×6): 10 mg via ORAL
  Filled 2023-09-23 (×6): qty 2

## 2023-09-23 MED ORDER — OXYCODONE HCL 5 MG PO TABS
10.0000 mg | ORAL_TABLET | Freq: Once | ORAL | Status: AC
  Administered 2023-09-23: 10 mg via ORAL
  Filled 2023-09-23: qty 2

## 2023-09-23 MED ORDER — DOXEPIN HCL 10 MG PO CAPS
10.0000 mg | ORAL_CAPSULE | Freq: Once | ORAL | Status: AC
  Administered 2023-09-24: 10 mg via ORAL
  Filled 2023-09-23: qty 1

## 2023-09-23 MED ORDER — OXYCODONE HCL 5 MG PO TABS
5.0000 mg | ORAL_TABLET | Freq: Once | ORAL | Status: AC
  Administered 2023-09-23: 5 mg via ORAL
  Filled 2023-09-23: qty 1

## 2023-09-23 NOTE — Progress Notes (Signed)
  Progress Note    09/23/2023 11:52 AM * No surgery date entered *  Subjective: Right foot pain improved overnight with pain control.   Vitals:   09/23/23 0533 09/23/23 0700  BP: 130/80 106/82  Pulse: 77 77  Resp: 18 17  Temp: 98.4 F (36.9 C) 97.8 F (36.6 C)  SpO2: 94% 100%   Physical Exam: Lungs:  non labored Extremities:  gangrenous toes of R foot; foot warm to touch Neurologic: A&O Nonpalpable pedal pulses.  CBC    Component Value Date/Time   WBC 8.9 09/22/2023 0111   RBC 4.87 09/22/2023 0111   HGB 10.1 (L) 09/22/2023 0111   HGB 9.9 (L) 02/23/2023 1210   HCT 35.0 (L) 09/22/2023 0111   HCT 34.6 (L) 02/23/2023 1210   PLT 366 09/22/2023 0111   PLT 362 02/23/2023 1210   MCV 71.9 (L) 09/22/2023 0111   MCV 67 (L) 02/23/2023 1210   MCH 20.7 (L) 09/22/2023 0111   MCHC 28.9 (L) 09/22/2023 0111   RDW 23.7 (H) 09/22/2023 0111   RDW 18.3 (H) 02/23/2023 1210   LYMPHSABS 1.5 09/21/2023 1329   LYMPHSABS 1.7 03/03/2022 1615   MONOABS 0.7 09/21/2023 1329   EOSABS 0.2 09/21/2023 1329   EOSABS 0.2 03/03/2022 1615   BASOSABS 0.0 09/21/2023 1329   BASOSABS 0.1 03/03/2022 1615    BMET    Component Value Date/Time   NA 133 (L) 09/23/2023 0820   NA 136 02/23/2023 1210   K 4.4 09/23/2023 0820   CL 97 (L) 09/23/2023 0820   CO2 27 09/23/2023 0820   GLUCOSE 129 (H) 09/23/2023 0820   BUN 8 09/23/2023 0820   BUN 8 02/23/2023 1210   CREATININE 0.51 (L) 09/23/2023 0820   CREATININE 0.94 03/31/2015 1454   CALCIUM 8.9 09/23/2023 0820   GFRNONAA >60 09/23/2023 0820   GFRAA 106 11/30/2020 0944    INR    Component Value Date/Time   INR 1.2 08/09/2023 0700     Intake/Output Summary (Last 24 hours) at 09/23/2023 1152 Last data filed at 09/23/2023 0522 Gross per 24 hour  Intake 116.63 ml  Output 800 ml  Net -683.37 ml     Assessment/Plan:  72 y.o. male with rest pain in the right foot; gangrenous toes  Recent duplex ultrasound of the left lower extremity bypass  demonstrates that it is patent, but has low flows.  Patient is scheduled for Monday left lower extremity angiogram for primary assisted patency.  Please make n.p.o. midnight.  Emilie Rutter, PA-C [facility] [phone] 09/23/2023 11:52 AM

## 2023-09-23 NOTE — Progress Notes (Signed)
Daily Progress Note Intern Pager: [phone]  Patient name: [name] Medical record number: [id number] Date of birth: [birth year] Age: 72 y.o. Gender: male  Primary Care Provider: Erick Alley, DO Consultants: VSS, Podiatry Code Status: Full Code  Pt Overview and Major Events to Date:  9/26: Admitted to FMTS    Assessment and Plan:  Streight is a 72 year old male with history of PAD and DM neuropathy presenting from Podiatry for gangrene of right toes. VSS and Podiatry following.  Assessment & Plan Gangrene of toe of right foot (HCC) Patient with increased pain and color changes of R toes x 2 weeks. MRI of R foot with osteomyelitis of great, second, and third toes. Patient remains afebrile without leukocytosis, s/p 1x Zosyn and Vanc. Patient feels pain has was poorly controlled overnight, but improved w/ higher dose oxy 10. VSS and Podiatry following. - Greatly appreciate VSS and Podiatry recs: - RLE angiogram planned for 9/30 given significant reduced R ABI and threatened bypass - Amputation to follow - No further abx at this time given clinical stability  - Consider Oxycodone 10 q4 PRN for pain - Tylenol 650 mg q6h scheduled  Anemia Hemoglobin 10.8 > 10.1.  Patient appears to be chronically anemic with a baseline 9-10. AM labs CBC pending, will continue to trend. 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 CBG range 88-150 yesterday. - Sensitive SSI - CBG checks before meals and at bedtime - Carb modified diet   Chronic and Stable Issues: HTN - continue amlodipine 5mg  every day, nebivolol 10mg  QD HLD - continue atorvastatin 40mg  QD, zetia 10mg  every day Diabetic neuropathy - continue gabapentin 600mg  BID Mood - continue fluoxetine 10 QD  FEN/GI: Carb Modified PPx: Heparin Dispo:SNF pending clinical  improvement .   Subjective:  Pain poorly controlled overnight, but was better when he got the higher dose of Oxy 10 mg.  Objective: Temp:  [97.9 F (36.6 C)-98.8 F (37.1 C)] 97.9 F (36.6 C) (09/27 2126) Pulse Rate:  [74-88] 88 (09/27 2126) Resp:  [18-20] 18 (09/27 2126) BP: (97-149)/(63-93) 140/79 (09/27 2126) SpO2:  [94 %-100 %] 98 % (09/27 2126) Physical Exam: General: Elderly, frail, in pain Cardiovascular: RRR, NRMG, S1/S2 Respiratory: CTABL, no wheezes, rhonci Abdomen: Soft, NTTP, non-distended Extremities: RLE w/ bandage C/D/I  Laboratory: Most recent CBC Lab Results  Component Value Date   WBC 8.9 09/22/2023   HGB 10.1 (L) 09/22/2023   HCT 35.0 (L) 09/22/2023   MCV 71.9 (L) 09/22/2023   PLT 366 09/22/2023   Most recent BMP    Latest Ref Rng & Units 09/22/2023    1:11 AM  BMP  Glucose 70 - 99 mg/dL 130   BUN 8 - 23 mg/dL 8   Creatinine 8.65 - 7.84 mg/dL 6.96   Sodium 295 - 284 mmol/L 137   Potassium 3.5 - 5.1 mmol/L 3.8   Chloride 98 - 111 mmol/L 104   CO2 22 - 32 mmol/L 24   Calcium 8.9 - 10.3 mg/dL 8.3      Bess Kinds, MD 09/23/2023, 12:44 AM  PGY-3, [facility]  FPTS Intern pager: [phone], text pages welcome Secure chat group [facility] Teaching Service

## 2023-09-23 NOTE — Assessment & Plan Note (Signed)
Takes metformin, last A1c 6.4 a month ago. CBG range 88-150 yesterday. - Sensitive SSI - CBG checks before meals and at bedtime - Carb modified diet

## 2023-09-23 NOTE — Assessment & Plan Note (Addendum)
Hemoglobin 10.8 > 10.1.  Patient appears to be chronically anemic with a baseline 9-10. AM labs CBC pending, will continue to trend. - F/u AM CBC - Consider iron studies and further evaluation if continues to decrease

## 2023-09-23 NOTE — Assessment & Plan Note (Addendum)
Patient with increased pain and color changes of R toes x 2 weeks. MRI of R foot with osteomyelitis of great, second, and third toes. Patient remains afebrile without leukocytosis, s/p 1x Zosyn and Vanc. Patient feels pain has was poorly controlled overnight, but improved w/ higher dose oxy 10. VSS and Podiatry following. - Greatly appreciate VSS and Podiatry recs: - RLE angiogram planned for 9/30 given significant reduced R ABI and threatened bypass - Amputation to follow - No further abx at this time given clinical stability  - Consider Oxycodone 10 q4 PRN for pain - Tylenol 650 mg q6h scheduled

## 2023-09-23 NOTE — Plan of Care (Signed)
Patient alert/oriented X4. Patient compliant with medication administration and heparin drip rate changed to 14 mL/kg/hr. Patient administered oxycodone as needed for pain and properly covered with insulin. VSS, will continue to monitor.   Problem: Education: Goal: Understanding of CV disease, CV risk reduction, and recovery process will improve Outcome: Progressing   Problem: Education: Goal: Individualized Educational Video(s) Outcome: Progressing   Problem: Activity: Goal: Ability to return to baseline activity level will improve Outcome: Progressing   Problem: Cardiovascular: Goal: Ability to achieve and maintain adequate cardiovascular perfusion will improve Outcome: Progressing   Problem: Cardiovascular: Goal: Vascular access site(s) Level 0-1 will be maintained Outcome: Progressing   Problem: Health Behavior/Discharge Planning: Goal: Ability to safely manage health-related needs after discharge will improve Outcome: Progressing   Problem: Education: Goal: Knowledge of prescribed regimen will improve Outcome: Progressing   Problem: Activity: Goal: Ability to tolerate increased activity will improve Outcome: Progressing   Problem: Bowel/Gastric: Goal: Gastrointestinal status for postoperative course will improve Outcome: Progressing   Problem: Clinical Measurements: Goal: Postoperative complications will be avoided or minimized Outcome: Progressing   Problem: Clinical Measurements: Goal: Signs and symptoms of graft occlusion will improve Outcome: Progressing   Problem: Skin Integrity: Goal: Demonstration of wound healing without infection will improve Outcome: Progressing   Problem: Education: Goal: Ability to describe self-care measures that may prevent or decrease complications (Diabetes Survival Skills Education) will improve Outcome: Progressing   Problem: Coping: Goal: Ability to adjust to condition or change in health will improve Outcome:  Progressing   Problem: Fluid Volume: Goal: Ability to maintain a balanced intake and output will improve Outcome: Progressing   Problem: Health Behavior/Discharge Planning: Goal: Ability to identify and utilize available resources and services will improve Outcome: Progressing   Problem: Health Behavior/Discharge Planning: Goal: Ability to manage health-related needs will improve Outcome: Progressing   Problem: Metabolic: Goal: Ability to maintain appropriate glucose levels will improve Outcome: Progressing   Problem: Nutritional: Goal: Maintenance of adequate nutrition will improve Outcome: Progressing   Problem: Nutritional: Goal: Progress toward achieving an optimal weight will improve Outcome: Progressing   Problem: Skin Integrity: Goal: Risk for impaired skin integrity will decrease Outcome: Progressing   Problem: Tissue Perfusion: Goal: Adequacy of tissue perfusion will improve Outcome: Progressing   Problem: Education: Goal: Knowledge of General Education information will improve Description: Including pain rating scale, medication(s)/side effects and non-pharmacologic comfort measures Outcome: Progressing   Problem: Health Behavior/Discharge Planning: Goal: Ability to manage health-related needs will improve Outcome: Progressing   Problem: Clinical Measurements: Goal: Ability to maintain clinical measurements within normal limits will improve Outcome: Progressing   Problem: Clinical Measurements: Goal: Will remain free from infection Outcome: Progressing   Problem: Clinical Measurements: Goal: Diagnostic test results will improve Outcome: Progressing   Problem: Clinical Measurements: Goal: Respiratory complications will improve Outcome: Progressing   Problem: Clinical Measurements: Goal: Cardiovascular complication will be avoided Outcome: Progressing   Problem: Activity: Goal: Risk for activity intolerance will decrease Outcome: Progressing    Problem: Nutrition: Goal: Adequate nutrition will be maintained Outcome: Progressing   Problem: Coping: Goal: Level of anxiety will decrease Outcome: Progressing   Problem: Elimination: Goal: Will not experience complications related to bowel motility Outcome: Progressing   Problem: Elimination: Goal: Will not experience complications related to urinary retention Outcome: Progressing   Problem: Pain Managment: Goal: General experience of comfort will improve Outcome: Progressing   Problem: Safety: Goal: Ability to remain free from injury will improve Outcome: Progressing   Problem: Skin Integrity: Goal: Risk  for impaired skin integrity will decrease Outcome: Progressing

## 2023-09-23 NOTE — Progress Notes (Signed)
ANTICOAGULATION CONSULT NOTE  Pharmacy Consult for Heparin Indication:  threatened bypass  Allergies  Allergen Reactions   Glipizide Other (See Comments)    REACTION IS SIDE EFFECT Severe hypoglycemia to 40s.     Patient Measurements: Height: 5\' 7"  (170.2 cm) Weight: 82.6 kg (182 lb 1.6 oz) IBW/kg (Calculated) : 66.1 Heparin Dosing Weight: 83 kg  Vital Signs: Temp: 97.8 F (36.6 C) (09/28 0700) Temp Source: Oral (09/28 0700) BP: 106/82 (09/28 0700) Pulse Rate: 77 (09/28 0700)  Labs: Recent Labs    09/21/23 1329 09/22/23 0111 09/22/23 2358  HGB 10.8* 10.1*  --   HCT 37.0* 35.0*  --   PLT 378 366  --   HEPARINUNFRC  --   --  0.46  CREATININE 0.74 0.67  --     Estimated Creatinine Clearance: 84.6 mL/min (by C-G formula based on SCr of 0.67 mg/dL).   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 male with history of PAD not on anticoagulation PTA who is admitted for gangrene of right toes. VVS following, imaging shows threatened right leg bypass with very sluggish flow.  Currently has lower extremity  angiogram with possible intervention to maintain patency of his bypass scheduled for Monday. Pharmacy consulted to manage heparin.   Previously on SQ heparin for VTE ppx, last dose ~1400. CBC stable.   Heparin level therapeutic  Goal of Therapy:  Heparin level 0.3-0.7 units/ml Monitor platelets by anticoagulation protocol: Yes   Plan:  Heparin to 1400 units / hr Daily heparin level Continue to monitor H&H and platelets  Thank you for allowing pharmacy to be a part of this patient's care.  Okey Regal, PharmD 09/23/2023 8:28 AM

## 2023-09-23 NOTE — Progress Notes (Signed)
ANTICOAGULATION CONSULT NOTE  Pharmacy Consult for Heparin Indication:  threatened bypass  Allergies  Allergen Reactions   Glipizide Other (See Comments)    REACTION IS SIDE EFFECT Severe hypoglycemia to 40s.     Patient Measurements: Height: 5\' 7"  (170.2 cm) Weight: 82.6 kg (182 lb 1.6 oz) IBW/kg (Calculated) : 66.1 Heparin Dosing Weight: 83 kg  Vital Signs: Temp: 97.9 F (36.6 C) (09/27 2126) Temp Source: Oral (09/27 2126) BP: 140/79 (09/27 2126) Pulse Rate: 88 (09/27 2126)  Labs: Recent Labs    09/21/23 1329 09/22/23 0111 09/22/23 2358  HGB 10.8* 10.1*  --   HCT 37.0* 35.0*  --   PLT 378 366  --   HEPARINUNFRC  --   --  0.46  CREATININE 0.74 0.67  --     Estimated Creatinine Clearance: 84.6 mL/min (by C-G formula based on SCr of 0.67 mg/dL).   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 male with history of PAD not on anticoagulation PTA who is admitted for gangrene of right toes. VVS following, imaging shows threatened right leg bypass with very sluggish flow.  Currently has lower extremity  angiogram with possible intervention to maintain patency of his bypass scheduled for Monday. Pharmacy consulted to manage heparin.   Previously on SQ heparin for VTE ppx, last dose ~1400. CBC stable.   9/28 AM: heparin level returned at 0.46 on 1300 units/hr. No signs/symptoms of bleeding or issues with the heparin infusion reported. Last CBC stable  Goal of Therapy:  Heparin level 0.3-0.7 units/ml Monitor platelets by anticoagulation protocol: Yes   Plan:  Continue heparin infusion at 1300 units/hr Check confirmatory heparin level in 8 hours and daily while on heparin Continue to monitor H&H and platelets  Thank you for allowing pharmacy to be a part of this patient's care.  Arabella Merles, PharmD. Clinical Pharmacist 09/23/2023 12:36 AM

## 2023-09-23 NOTE — Assessment & Plan Note (Signed)
>>  ASSESSMENT AND PLAN FOR ANEMIA WRITTEN ON 09/23/2023  6:30 AM BY Bess Kinds, MD  Hemoglobin 10.8 > 10.1.  Patient appears to be chronically anemic with a baseline 9-10. AM labs CBC pending, will continue to trend. - F/u AM CBC - Consider iron studies and further evaluation if continues to decrease

## 2023-09-24 DIAGNOSIS — I96 Gangrene, not elsewhere classified: Secondary | ICD-10-CM | POA: Diagnosis not present

## 2023-09-24 LAB — CBC
HCT: 34 % — ABNORMAL LOW (ref 39.0–52.0)
Hemoglobin: 9.8 g/dL — ABNORMAL LOW (ref 13.0–17.0)
MCH: 20.5 pg — ABNORMAL LOW (ref 26.0–34.0)
MCHC: 28.8 g/dL — ABNORMAL LOW (ref 30.0–36.0)
MCV: 71.1 fL — ABNORMAL LOW (ref 80.0–100.0)
Platelets: 340 10*3/uL (ref 150–400)
RBC: 4.78 MIL/uL (ref 4.22–5.81)
RDW: 22.6 % — ABNORMAL HIGH (ref 11.5–15.5)
WBC: 6.3 10*3/uL (ref 4.0–10.5)
nRBC: 0 % (ref 0.0–0.2)

## 2023-09-24 LAB — GLUCOSE, CAPILLARY
Glucose-Capillary: 121 mg/dL — ABNORMAL HIGH (ref 70–99)
Glucose-Capillary: 157 mg/dL — ABNORMAL HIGH (ref 70–99)
Glucose-Capillary: 176 mg/dL — ABNORMAL HIGH (ref 70–99)
Glucose-Capillary: 170 mg/dL — ABNORMAL HIGH (ref 70–99)

## 2023-09-24 LAB — HEPARIN LEVEL (UNFRACTIONATED): Heparin Unfractionated: 0.47 [IU]/mL (ref 0.30–0.70)

## 2023-09-24 MED ORDER — OXYCODONE HCL 5 MG PO TABS
7.5000 mg | ORAL_TABLET | ORAL | Status: DC | PRN
  Administered 2023-09-24 – 2023-09-30 (×19): 7.5 mg via ORAL
  Filled 2023-09-24 (×18): qty 2

## 2023-09-24 MED ORDER — RAMELTEON 8 MG PO TABS
8.0000 mg | ORAL_TABLET | Freq: Once | ORAL | Status: AC
  Administered 2023-09-24: 8 mg via ORAL
  Filled 2023-09-24: qty 1

## 2023-09-24 MED ORDER — OXYCODONE HCL 5 MG PO TABS: 5.0000 mg | ORAL_TABLET | Freq: Once | ORAL | Status: DC

## 2023-09-24 MED ORDER — DOXEPIN HCL 10 MG PO CAPS: 10.0000 mg | ORAL_CAPSULE | Freq: Once | ORAL | Status: DC

## 2023-09-24 NOTE — Assessment & Plan Note (Signed)
>>  ASSESSMENT AND PLAN FOR ANEMIA WRITTEN ON 09/24/2023  5:56 AM BY SANFORD, JAMES B, MD  Hemoglobin 10.1 > 9.8.  His baseline seems to be right around 10..  - Will obtain iron studies with am labs, has a history of IDA. Would replete with PO vs IV iron if labs redemonstrate IDA.

## 2023-09-24 NOTE — Progress Notes (Signed)
ANTICOAGULATION CONSULT NOTE  Pharmacy Consult for Heparin Indication:  threatened bypass  Allergies  Allergen Reactions   Glipizide Other (See Comments)    REACTION IS SIDE EFFECT Severe hypoglycemia to 40s.     Patient Measurements: Height: 5\' 7"  (170.2 cm) Weight: 82.6 kg (182 lb 1.6 oz) IBW/kg (Calculated) : 66.1 Heparin Dosing Weight: 83 kg  Vital Signs: Temp: 98.2 F (36.8 C) (09/29 0734) Temp Source: Oral (09/29 0734) BP: 90/78 (09/29 0734) Pulse Rate: 71 (09/29 0734)  Labs: Recent Labs    09/21/23 1329 09/22/23 0111 09/22/23 2358 09/23/23 0820 09/24/23 0426  HGB 10.8* 10.1*  --   --  9.8*  HCT 37.0* 35.0*  --   --  34.0*  PLT 378 366  --   --  340  HEPARINUNFRC  --   --  0.46  --  0.47  CREATININE 0.74 0.67  --  0.51*  --     Estimated Creatinine Clearance: 84.6 mL/min (A) (by C-G formula based on SCr of 0.51 mg/dL (L)).   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 male with history of PAD not on anticoagulation PTA who is admitted for gangrene of right toes. VVS following, imaging shows  threatened right leg bypass with very sluggish flow.  Currently has lower extremity angiogram with possible intervention to maintain patency of his bypass scheduled for Monday. Pharmacy consulted to manage heparin.   Heparin level therapeutic  Goal of Therapy:  Heparin level 0.3-0.7 units/ml Monitor platelets by anticoagulation protocol: Yes   Plan:  Heparin at 1400 units / hr Daily heparin level Continue to monitor H&H and platelets  Thank you for allowing pharmacy to be a part of this patient's care.  Okey Regal, PharmD 09/24/2023 8:12 AM

## 2023-09-24 NOTE — Progress Notes (Signed)
Spoke to pharmacy about heparin gtt and upcoming LLE angiogram. They reported that VVS would stop the heparin when indicated. No end date to heparin on orders but will defer to VVS.   [name]

## 2023-09-24 NOTE — Progress Notes (Signed)
NPO midnight for LLE angiogram

## 2023-09-24 NOTE — Assessment & Plan Note (Signed)
Pain is much better controlled with the 10mg  dose of oxycodone. The leg remains without evidence of spreading infection and he remains without an elevated white count.  - Greatly appreciate VSS and Podiatry recs: - RLE angiogram planned for 9/30 given significant reduced R ABI and threatened bypass - Amputation to follow - No further abx at this time given clinical stability and no evidence of spreading infection or systemic illness. - Tylenol 650 mg q6h scheduled  - Oxycodone 10 q4 PRN for pain - NPO at midnight ahead of procedure tomorrow  - Remains on heparin gtt  - Also on gabapentin 600mg  BID - on ASA, atorvastatin for PAD

## 2023-09-24 NOTE — Progress Notes (Signed)
Daily Progress Note Intern Pager: [phone]  Patient name: [name] Medical record number: [id number] Date of birth: 02-13-1951 Age: 72 y.o. Gender: male  Primary Care Provider: Erick Alley, DO Consultants: Vascular Surgery, Podiatry Code Status: Full Code  Pt Overview and Major Events to Date:  9/26- admitted 9/30- scheduled for angiogram   Assessment and Plan:  Mr. Beach is a 72 yo male here with gangrenous toes of the Right foot found to have osteomyelitis on MRI, currently undergoing workup with vascular surgery and podiatry ahead of a TMA vs BKA/AKA, pending angiogram. Pertinent PMH/PSH includes prior LLE amputation, PAD, DM .    Assessment & Plan Gangrene of toe of right foot (HCC) Pain is much better controlled with the 10mg  dose of oxycodone. The leg remains without evidence of spreading infection and he remains without an elevated white count.  - Greatly appreciate VSS and Podiatry recs: - RLE angiogram planned for 9/30 given significant reduced R ABI and threatened bypass - Amputation to follow - No further abx at this time given clinical stability and no evidence of spreading infection or systemic illness. - Tylenol 650 mg q6h scheduled  - Oxycodone 10 q4 PRN for pain - NPO at midnight ahead of procedure tomorrow  - Remains on heparin gtt  - Also on gabapentin 600mg  BID - on ASA, atorvastatin for PAD  Anemia Hemoglobin 10.1 > 9.8.  His baseline seems to be right around 10..  - Will obtain iron studies with am labs, has a history of IDA. Would replete with PO vs IV iron if labs redemonstrate IDA.  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 CBG range 119-167 yesterday. Only got one unit of short acting insulin. - DC CBG checks and SSI - Carb modified diet  HTN - continue amlodipine 5mg  every day, nebivolol 10mg  QD HLD - continue atorvastatin 40mg  QD, zetia 10mg  every  day Diabetic neuropathy - continue gabapentin 600mg  BID Mood - continue fluoxetine 10 QD  FEN/GI: Carb modified PPx: On heparin gtt  Dispo: Pending PT/OT evaluation after amputation which is pending further workup .   Subjective:  Tells me he's doing "not to bad" today. Oxy 10mg  seems to do a good job of controlling his pain. Understands the plan today is just to await his procedure tomorrow.   Objective: Temp:  [97.8 F (36.6 C)-98.4 F (36.9 C)] 97.8 F (36.6 C) (09/28 2038) Pulse Rate:  [73-77] 73 (09/28 2038) Resp:  [16-18] 18 (09/28 2038) BP: (96-130)/(53-82) 115/78 (09/28 2038) SpO2:  [94 %-100 %] 96 % (09/28 2038) Physical Exam: General: Chronically ill appearing, comfortable in bed Cardiovascular: RRR Respiratory: Normal WOB on RA, lungs clear anteriorly Abdomen: Non-tender, non-distended Extremities: RLE with bandaged foot, no warmth, erythema, or streaking extending proximally from bandage   Laboratory: Most recent CBC Lab Results  Component Value Date   WBC 6.3 09/24/2023   HGB 9.8 (L) 09/24/2023   HCT 34.0 (L) 09/24/2023   MCV 71.1 (L) 09/24/2023   PLT 340 09/24/2023   Most recent BMP    Latest Ref Rng & Units 09/23/2023    8:20 AM  BMP  Glucose 70 - 99 mg/dL 147   BUN 8 - 23 mg/dL 8   Creatinine 8.29 - 5.62 mg/dL 1.30   Sodium 865 - 784 mmol/L 133   Potassium 3.5 - 5.1 mmol/L 4.4   Chloride 98 - 111 mmol/L 97  CO2 22 - 32 mmol/L 27   Calcium 8.9 - 10.3 mg/dL 8.9      Imaging/Diagnostic Tests: No new imaging, labs. Awaiting angiogram on 9/30  Alicia Amel, MD 09/24/2023, 5:30 AM  PGY-3, [facility] FPTS Intern pager: [phone], text pages welcome Secure chat group [facility] Teaching Service

## 2023-09-24 NOTE — Assessment & Plan Note (Signed)
Hemoglobin 10.1 > 9.8.  His baseline seems to be right around 10..  - Will obtain iron studies with am labs, has a history of IDA. Would replete with PO vs IV iron if labs redemonstrate IDA.

## 2023-09-24 NOTE — Assessment & Plan Note (Signed)
Takes metformin, last A1c 6.4 a month ago. CBG range 119-167 yesterday. Only got one unit of short acting insulin. - DC CBG checks and SSI - Carb modified diet

## 2023-09-25 ENCOUNTER — Encounter (HOSPITAL_COMMUNITY)
Admission: EM | Disposition: A | Discharge status: Skilled Nursing Facility | Payer: Self-pay | Source: Ambulatory Visit | Attending: Family Medicine

## 2023-09-25 DIAGNOSIS — I771 Stricture of artery: Secondary | ICD-10-CM

## 2023-09-25 DIAGNOSIS — I70261 Atherosclerosis of native arteries of extremities with gangrene, right leg: Secondary | ICD-10-CM | POA: Diagnosis not present

## 2023-09-25 DIAGNOSIS — I96 Gangrene, not elsewhere classified: Secondary | ICD-10-CM | POA: Diagnosis not present

## 2023-09-25 HISTORY — PX: UPPER EXTREMITY ANGIOGRAPHY: CATH118270

## 2023-09-25 HISTORY — PX: ABDOMINAL AORTOGRAM W/LOWER EXTREMITY: CATH118223

## 2023-09-25 HISTORY — PX: AORTIC ARCH ANGIOGRAPHY: CATH118224

## 2023-09-25 LAB — GLUCOSE, CAPILLARY
Glucose-Capillary: 116 mg/dL — ABNORMAL HIGH (ref 70–99)
Glucose-Capillary: 122 mg/dL — ABNORMAL HIGH (ref 70–99)
Glucose-Capillary: 125 mg/dL — ABNORMAL HIGH (ref 70–99)
Glucose-Capillary: 176 mg/dL — ABNORMAL HIGH (ref 70–99)

## 2023-09-25 LAB — BASIC METABOLIC PANEL
Anion gap: 8 (ref 5–15)
BUN: 7 mg/dL — ABNORMAL LOW (ref 8–23)
CO2: 30 mmol/L (ref 22–32)
Calcium: 8.7 mg/dL — ABNORMAL LOW (ref 8.9–10.3)
Chloride: 98 mmol/L (ref 98–111)
Creatinine, Ser: 0.76 mg/dL (ref 0.61–1.24)
GFR, Estimated: >60 mL/min (ref >60)
Glucose, Bld: 123 mg/dL — ABNORMAL HIGH (ref 70–99)
Potassium: 4.1 mmol/L (ref 3.5–5.1)
Sodium: 136 mmol/L (ref 135–145)

## 2023-09-25 LAB — CBC
HCT: 34.7 % — ABNORMAL LOW (ref 39.0–52.0)
Hemoglobin: 10 g/dL — ABNORMAL LOW (ref 13.0–17.0)
MCH: 21.1 pg — ABNORMAL LOW (ref 26.0–34.0)
MCHC: 28.8 g/dL — ABNORMAL LOW (ref 30.0–36.0)
MCV: 73.1 fL — ABNORMAL LOW (ref 80.0–100.0)
Platelets: 352 10*3/uL (ref 150–400)
RBC: 4.75 MIL/uL (ref 4.22–5.81)
RDW: 22.7 % — ABNORMAL HIGH (ref 11.5–15.5)
WBC: 6.2 10*3/uL (ref 4.0–10.5)
nRBC: 0 % (ref 0.0–0.2)

## 2023-09-25 LAB — IRON AND TIBC
Iron: 39 ug/dL — ABNORMAL LOW (ref 45–182)
Saturation Ratios: 11 % — ABNORMAL LOW (ref 17.9–39.5)
TIBC: 353 ug/dL (ref 250–450)
UIBC: 314 ug/dL

## 2023-09-25 LAB — FERRITIN: Ferritin: 23 ng/mL — ABNORMAL LOW (ref 24–336)

## 2023-09-25 LAB — POCT ACTIVATED CLOTTING TIME
Activated Clotting Time: 226 s
Activated Clotting Time: 171 s

## 2023-09-25 LAB — HEPARIN LEVEL (UNFRACTIONATED): Heparin Unfractionated: 0.42 [IU]/mL (ref 0.30–0.70)

## 2023-09-25 SURGERY — ABDOMINAL AORTOGRAM W/LOWER EXTREMITY
Anesthesia: LOCAL | Laterality: Right

## 2023-09-25 MED ORDER — LABETALOL HCL 5 MG/ML IV SOLN
INTRAVENOUS | Status: AC
  Administered 2023-09-25: 10 mg
  Filled 2023-09-25: qty 4

## 2023-09-25 MED ORDER — LIDOCAINE HCL (PF) 1 % IJ SOLN
INTRAMUSCULAR | Status: DC | PRN
  Administered 2023-09-25: 2 mL
  Administered 2023-09-25: 15 mL

## 2023-09-25 MED ORDER — OXYCODONE HCL 5 MG PO TABS
ORAL_TABLET | ORAL | Status: AC
  Filled 2023-09-25: qty 2

## 2023-09-25 MED ORDER — IODIXANOL 320 MG/ML IV SOLN
INTRAVENOUS | Status: DC | PRN
  Administered 2023-09-25: 170 mL

## 2023-09-25 MED ORDER — HEPARIN (PORCINE) IN NACL 1000-0.9 UT/500ML-% IV SOLN
INTRAVENOUS | Status: DC | PRN
  Administered 2023-09-25 (×2): 500 mL

## 2023-09-25 MED ORDER — HYDRALAZINE HCL 20 MG/ML IJ SOLN: 10.0000 mg | Freq: Once | INTRAMUSCULAR | Status: DC

## 2023-09-25 MED ORDER — HEPARIN (PORCINE) 25000 UT/250ML-% IV SOLN
1600.0000 [IU]/h | INTRAVENOUS | Status: AC
  Administered 2023-09-25: 1400 [IU]/h via INTRAVENOUS
  Administered 2023-09-26: 1500 [IU]/h via INTRAVENOUS
  Filled 2023-09-25 (×3): qty 250

## 2023-09-25 MED ORDER — SODIUM CHLORIDE 0.9 % IV SOLN: INTRAVENOUS | Status: DC

## 2023-09-25 MED ORDER — LIDOCAINE-EPINEPHRINE 1 %-1:100000 IJ SOLN
INTRAMUSCULAR | Status: AC
  Filled 2023-09-25: qty 1

## 2023-09-25 MED ORDER — MIDAZOLAM HCL 2 MG/2ML IJ SOLN
INTRAMUSCULAR | Status: DC | PRN
  Administered 2023-09-25 (×2): .5 mg via INTRAVENOUS
  Administered 2023-09-25: 1 mg via INTRAVENOUS

## 2023-09-25 MED ORDER — LIDOCAINE HCL (PF) 1 % IJ SOLN
INTRAMUSCULAR | Status: AC
  Filled 2023-09-25: qty 30

## 2023-09-25 MED ORDER — MIDAZOLAM HCL 2 MG/2ML IJ SOLN
INTRAMUSCULAR | Status: AC
  Filled 2023-09-25: qty 2

## 2023-09-25 MED ORDER — FENTANYL CITRATE (PF) 100 MCG/2ML IJ SOLN
INTRAMUSCULAR | Status: DC | PRN
  Administered 2023-09-25: 50 ug via INTRAVENOUS
  Administered 2023-09-25 (×3): 25 ug via INTRAVENOUS

## 2023-09-25 MED ORDER — OXYCODONE HCL 5 MG PO TABS
5.0000 mg | ORAL_TABLET | Freq: Once | ORAL | Status: AC
  Administered 2023-09-25: 5 mg via ORAL
  Filled 2023-09-25: qty 1

## 2023-09-25 MED ORDER — FENTANYL CITRATE (PF) 100 MCG/2ML IJ SOLN
INTRAMUSCULAR | Status: AC
  Filled 2023-09-25: qty 2

## 2023-09-25 MED ORDER — LABETALOL HCL 5 MG/ML IV SOLN: 10.0000 mg | Freq: Once | INTRAVENOUS | Status: DC

## 2023-09-25 MED ORDER — LIDOCAINE-EPINEPHRINE 1 %-1:100000 IJ SOLN
INTRAMUSCULAR | Status: DC | PRN
  Administered 2023-09-25: 8 mL

## 2023-09-25 MED ORDER — HEPARIN SODIUM (PORCINE) 1000 UNIT/ML IJ SOLN
INTRAMUSCULAR | Status: DC | PRN
  Administered 2023-09-25 (×2): 5000 [IU] via INTRAVENOUS

## 2023-09-25 SURGICAL SUPPLY — 21 items
CATH ANGIO 5F BER2 100CM (CATHETERS) IMPLANT
CATH ANGIO 5F PIGTAIL 100CM (CATHETERS) IMPLANT
CATH ANGIO 5F SIM1 100CM (CATHETERS) IMPLANT
CATH HEADHUNTER H1 5F 100CM (CATHETERS) IMPLANT
CATH INFINITI 5FR JR4 125CM (CATHETERS) IMPLANT
CATH INFINITI VERT 5FR 125CM (CATHETERS) IMPLANT
CATH NAVICROSS ST 65CM (CATHETERS) IMPLANT
CATH OMNI FLUSH 5F 65CM (CATHETERS) IMPLANT
CATHETER NAVICROSS ST 65CM (CATHETERS) ×3
CLOSURE MYNX CONTROL 5F (Vascular Products) IMPLANT
COVER DOME SNAP 22 D (MISCELLANEOUS) IMPLANT
DEVICE TORQUE .025-.038 (MISCELLANEOUS) IMPLANT
GLIDEWIRE ADV .035X260CM (WIRE) IMPLANT
GUIDEWIRE ANGLED .035X150CM (WIRE) IMPLANT
KIT MICROPUNCTURE NIT STIFF (SHEATH) IMPLANT
KIT SYRINGE INJ CVI SPIKEX1 (MISCELLANEOUS) IMPLANT
PROTECTION STATION PRESSURIZED (MISCELLANEOUS) ×3
SET ATX-X65L (MISCELLANEOUS) IMPLANT
SHEATH PINNACLE 5F 10CM (SHEATH) IMPLANT
STATION PROTECTION PRESSURIZED (MISCELLANEOUS) IMPLANT
TRAY PV CATH (CUSTOM PROCEDURE TRAY) ×4 IMPLANT

## 2023-09-25 NOTE — Progress Notes (Signed)
Pt received in 4E16, R groin site and L brachial sites assessed and WNL.  Telemetry applied, CCMD notified.  Pt oriented to room, call bell within reach.

## 2023-09-25 NOTE — Progress Notes (Signed)
Daily Progress Note Intern Pager: [phone]  Patient name: [name] Medical record number: [id number] Date of birth: 1951-05-08 Age: 72 y.o. Gender: male  Primary Care Provider: Erick Alley, DO Consultants: VSS, Podiatry Code Status: Full  Pt Overview and Major Events to Date:  9/26: Admitted to FMTS  Assessment and Plan:  [name] is a 72 year old male with history of PAD and DM neuropathy presenting from Podiatry for gangrene of right toes. VSS and Podiatry following. Given significant R ABI and threatened R leg bypass, getting RLE angiogram today before proceeding with amputation.  Assessment & Plan Gangrene of toe of right foot (HCC) Pain remains unchanged, receiving some relief from current pain regimen. The leg remains without evidence of spreading infection and patient remains without an elevated white count.  - Greatly appreciate VSS and Podiatry recs: - RLE angiogram today given significant reduced R ABI and threatened bypass - Amputation to follow - No further abx at this time given clinical stability and no evidence of spreading infection or systemic illness. - Tylenol 650 mg q6h scheduled  - Oxycodone 7.5 q4 PRN for pain - Gabapentin 600mg  BID - Heparin gtt stopped at 6am this morning for angiogram  - on ASA, atorvastatin for PAD - AM CBC  Anemia Hemoglobin stable 10.1 > 9.8 > 10.0. Baseline appears around 10. Iron 39, sat 11%, TIBC 353, and ferritin 23.  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 CBG range 116-176 over last day. D/ced SSI given minimal insulin use. CBGs per floor protocol. Carb modified diet.  Chronic and Stable Issues: HTN - continue amlodipine 5mg  every day, nebivolol 10mg  QD HLD - continue atorvastatin 40mg  QD, zetia 10mg  every day Diabetic neuropathy - continue gabapentin 600mg   BID Mood - continue fluoxetine 10 QD  FEN/GI: Carb modified diet PPx: holding iso angiogram this AM Dispo: Home pending surgical management of R foot   Subjective:  Patient is feeling unchanged this morning.  He feels his pain of his right lower extremity remains consistent.  He has some relief from his pain regimen, but still finds it hard to sleep.  No CP, SOB, HA, or VC.  Tolerating PO well without N/V/D.  Objective: Temp:  [98 F (36.7 C)-98.9 F (37.2 C)] 98.9 F (37.2 C) (09/30 0719) Pulse Rate:  [71-82] 73 (09/30 0719) Resp:  [16-18] 16 (09/30 0719) BP: (90-127)/(69-96) 127/69 (09/30 0719) SpO2:  [95 %-99 %] 95 % (09/30 0719) Physical Exam: General: Well- appearing, no acute distress Cardiovascular: Normal S1/S2. No extra heart sounds. Respiratory: Breathing comfortably on room air. Appropriate air movement bilaterally. No increased work of breathing. Abdomen: Soft, nontender, nondistended.  Extremities: R foot and ankle wrapped. No obvious signs of bleeding or drainage.   Laboratory: Most recent CBC Lab Results  Component Value Date   WBC 6.2 09/25/2023   HGB 10.0 (L) 09/25/2023   HCT 34.7 (L) 09/25/2023   MCV 73.1 (L) 09/25/2023   PLT 352 09/25/2023   Most recent BMP    Latest Ref Rng & Units 09/25/2023    4:56 AM  BMP  Glucose 70 - 99 mg/dL 130   BUN 8 - 23 mg/dL 7   Creatinine 8.65 - 7.84 mg/dL 6.96   Sodium 295 - 284 mmol/L 136   Potassium 3.5 - 5.1 mmol/L 4.1   Chloride 98 - 111  mmol/L 98   CO2 22 - 32 mmol/L 30   Calcium 8.9 - 10.3 mg/dL 8.7    [name], MD 09/25/2023, 7:29 AM  PGY-1, [facility] FPTS Intern pager: [phone], text pages welcome Secure chat group [facility] Teaching Service

## 2023-09-25 NOTE — Progress Notes (Signed)
ANTICOAGULATION CONSULT NOTE  Pharmacy Consult for Heparin Indication:  threatened bypass  Allergies  Allergen Reactions   Glipizide Other (See Comments)    REACTION IS SIDE EFFECT Severe hypoglycemia to 40s.     Patient Measurements: Height: 5\' 7"  (170.2 cm) Weight: 82.6 kg (182 lb 1.6 oz) IBW/kg (Calculated) : 66.1 Heparin Dosing Weight: 83 kg  Vital Signs: Temp: 98.1 F (36.7 C) (09/30 1624) Temp Source: Oral (09/30 1624) BP: 104/80 (09/30 1624) Pulse Rate: 73 (09/30 1624)  Labs: Recent Labs    09/22/23 2358 09/23/23 0820 09/24/23 0426 09/25/23 0456  HGB  --   --  9.8* 10.0*  HCT  --   --  34.0* 34.7*  PLT  --   --  340 352  HEPARINUNFRC 0.46  --  0.47 0.42  CREATININE  --  0.51*  --  0.76    Estimated Creatinine Clearance: 84.6 mL/min (by C-G formula based on SCr of 0.76 mg/dL).   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 male with history of PAD not on anticoagulation PTA who is admitted for gangrene of right toes. VVS following, imaging shows threatened right leg bypass with very sluggish  flow.  Currently has lower extremity angiogram with possible intervention to maintain patency of his bypass scheduled for Monday. Pharmacy consulted to manage heparin.   Today, s/p RLE angiogram. Per d/w Dr. Randie Heinz - ok to resume IV heparin 4 hr s/p procedure without a bolus. Will resume previous rate of 1400 units/hr since level was therapeutic at 0.42 this morning. Hgb 10.0, Plts WNL.  Goal of Therapy:  Heparin level 0.3-0.7 units/ml Monitor platelets by anticoagulation protocol: Yes   Plan:  At 17:00, Resume Heparin at 1400 units / hr Check heparin level with AM labs Daily heparin level and CBC Continue to monitor H&H and platelets  Thank you for allowing pharmacy to be a part of this patient's care.  Loralee Pacas, PharmD, BCPS 09/25/2023 4:33 PM   Please check AMION for all [facility] Pharmacy phone numbers After 10:00 PM, call Main Pharmacy [phone]

## 2023-09-25 NOTE — Care Management Important Message (Signed)
Important Message  Patient Details  Name: [name] MRN: [id number] Date of Birth: 1951-09-12   Important Message Given:  Yes - Medicare IM Patient left prior to IM delivery will mail a copy of th patient home address.     Gara Kincade 09/25/2023, 3:45 PM

## 2023-09-25 NOTE — Progress Notes (Addendum)
     [facility] [place] PROGRESS NOTE  ASSESSMENT / PLAN: [name] is a 72 y.o. male with threatened right common femoral to peroneal artery bypass  Recommend:  Abstinence from all tobacco products. Blood glucose control with goal A1c < 7%. Blood pressure control with goal blood pressure < 140/90 mmHg. Lipid reduction therapy with goal LDL-C <100 mg/dL  Aspirin 81mg  PO QD.  Atorvastatin 40-80mg  PO QD (or other "high intensity" statin therapy).  Plan right lower extremity angiogram with possible intervention in cath lab today with Dr. Randie Heinz. He has a known left iliac occlusion.   SUBJECTIVE: No complaints. Reviewed plan for angiogram today.  OBJECTIVE: BP 127/69 (BP Location: Right Arm)   Pulse 73   Temp 98.9 F (37.2 C) (Oral)   Resp 16   Ht 5\' 7"  (1.702 m)   Wt 82.6 kg   SpO2 95%   BMI 28.52 kg/m   No distress No left femoral pulse No pulse in bypass Warm R foot      Latest Ref Rng & Units 09/25/2023    4:56 AM 09/24/2023    4:26 AM 09/22/2023    1:11 AM  CBC  WBC 4.0 - 10.5 K/uL 6.2  6.3  8.9   Hemoglobin 13.0 - 17.0 g/dL 45.4  9.8  09.8   Hematocrit 39.0 - 52.0 % 34.7  34.0  35.0   Platelets 150 - 400 K/uL 352  340  366         Latest Ref Rng & Units 09/25/2023    4:56 AM 09/23/2023    8:20 AM 09/22/2023    1:11 AM  CMP  Glucose 70 - 99 mg/dL 119  147  829   BUN 8 - 23 mg/dL 7  8  8    Creatinine 0.61 - 1.24 mg/dL 5.62  1.30  8.65   Sodium 135 - 145 mmol/L 136  133  137   Potassium 3.5 - 5.1 mmol/L 4.1  4.4  3.8   Chloride 98 - 111 mmol/L 98  97  104   CO2 22 - 32 mmol/L 30  27  24    Calcium 8.9 - 10.3 mg/dL 8.7  8.9  8.3     Estimated Creatinine Clearance: 84.6 mL/min (by C-G formula based on SCr of 0.76 mg/dL).  Rande Brunt. Lenell Antu, MD [facility] Phone Number: [phone] 09/25/2023 8:12 AM    I have independently interviewed and examined patient and agree with PA  assessment and plan above. Discussed with partners. Angiogram today from left brachial approach.  Coutney Wildermuth C. Randie Heinz, MD [facility] [place] Office: [phone] Pager: [phone]

## 2023-09-25 NOTE — Assessment & Plan Note (Addendum)
Hemoglobin stable 10.1 > 9.8 > 10.0. Baseline appears around 10. Iron 39, sat 11%, TIBC 353, and ferritin 23.  - Consider iron supplementation after post op recovery period  - AM CBC

## 2023-09-25 NOTE — Op Note (Signed)
Patient name: KOHLE BIAGIOTTI MRN: [id number] DOB: 1951/11/11 Sex: male  09/25/2023 Pre-operative Diagnosis: Atherosclerosis of native arteries with gangrene of right toes and threatened bypass graft Post-operative diagnosis:  Same Surgeon:  Luanna Salk. Randie Heinz, MD Procedure Performed: 1.  Percutaneous ultrasound-guided access and Mynx device closure right common femoral artery 2.  Ultrasound-guided access left brachial artery 3.  Left upper extremity angiogram 4.  Arch aortogram 5.  Abdominal aortogram with right lower extremity angiography 6.  Moderate sedation with fentanyl and Versed for 105 minutes  Indications: 72 year old male with history of gangrene of his right foot.  He is on maximal medical therapy with aspirin and statin and has undergone right common femoral to peroneal artery bypass with evidence of impending graft failure.  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  We then cannulated the right common femoral artery and performed aortogram of the abdomen followed by right lower extremity angiography with plans for intervention we attempted to cross the occluded subclavian artery both an antegrade and retrograde direction but unfortunately from a retrograde direction caused a significant dissection and we are unable to safely get back into the true luminal plane.  A completion arch aortogram there was no evidence of antegrade arch dissection and no intervention was undertaken and his left hand was well-perfused at completion and the brachial sheath will be pulled postoperative holding.  The common femoral is patulous consistent with recent endarterectomy and there is common external iliac artery stents that are patent.  Approximately the bypass is severely narrowed and appears to enlarge in the mid thigh distally the  bypass is not visualized there is a large peroneal artery however this only gives rise to very small plantar arteries.  Given the difficult approach I discussed with the patient his only option being peroneal access and he elected to not proceed with any further cannulation sites.  He will be considered for high risk right transmetatarsal amputation versus above-knee amputation.   Procedure:  The patient was identified in the holding area and taken to room 8.  The patient was then placed supine on the table and prepped and draped in the usual sterile fashion.  A time out was called.  Ultrasound was used to evaluate the left brachial artery.  This artery was large and patent and the area was anesthetized with 1% lidocaine and cannulated with a micropuncture needle followed by wire and sheath with direct ultrasound visualization and ultrasound images saved to the permanent record.  We then placed a Bentson wire followed by 5 [place] sheath.  We then performed retrograde angiography and the subclavian artery was noted to be occluded.  5000 units of heparin was administered I attempted to cross retrograde using combination of Glidewire and Glidewire advantage and Berenstein catheter and Nava cross catheter.  I was unable to easily cross and so the attention was then turned to the right groin which had also been prepped and draped.  Ultrasound was used to identify a very difficult common femoral artery given significant postoperative hematoma.  This area was anesthetized and then with direct ultrasound visualization I cannulated the common femoral artery with a micropuncture needle followed by wire followed by dilatation with a micropuncture dilator followed by the micropuncture sheath.  Again an ultrasound images saved the permanent record.  I placed a Glidewire advantage and then  dilated the wire tract and placed a 5 French sheath.  We then placed an Omni catheter to the level of L1 and performed aortogram followed  by right lower extremity angiography which demonstrated the diminutive nature of the bypass.  Given that is elected for intervention we then placed a wire into the arch of the aorta and attempted with multiple catheters including Berenstein, Vert, H1 and JR4 catheters but ultimately I was unable to cross the subclavian antegrade.  With this I turned attention back to the subclavian approach in a retrograde fashion.  An additional 5000 units of heparin was administered.  I then used a Marshia Ly cross catheter and regular Glidewire but ultimately a dissection plane was encountered.  Retrograde angiography demonstrated dissection of the subclavian extending down to the level of the aorta and I elected to terminate retrograde access.  I then performed arch aortogram from an antegrade approach and that demonstrated no arch dissection.  With this I discussed possible perineal access for the patient and he elected no further intervention at this time.  All catheters were removed over wires.  We deployed a minx device in the right common femoral artery and the left brachial sheath will need to be pulled postoperative holding.  Patient did tolerate procedure without any complication.  Contrast: 170 cc   Jimy Gates C. Randie Heinz, MD [facility] [place] Office: [phone] Pager: [phone]

## 2023-09-25 NOTE — Assessment & Plan Note (Addendum)
Pain remains unchanged, receiving some relief from current pain regimen. The leg remains without evidence of spreading infection and patient remains without an elevated white count.  - Greatly appreciate VSS and Podiatry recs: - RLE angiogram today given significant reduced R ABI and threatened bypass - Amputation to follow - No further abx at this time given clinical stability and no evidence of spreading infection or systemic illness. - Tylenol 650 mg q6h scheduled  - Oxycodone 7.5 q4 PRN for pain - Gabapentin 600mg  BID - Heparin gtt stopped at 6am this morning for angiogram  - on ASA, atorvastatin for PAD - AM CBC

## 2023-09-25 NOTE — Assessment & Plan Note (Signed)
>>  ASSESSMENT AND PLAN FOR ANEMIA WRITTEN ON 09/25/2023  3:20 PM BY [name], MD  Hemoglobin stable 10.1 > 9.8 > 10.0. Baseline appears around 10. Iron 39, sat 11%, TIBC 353, and ferritin 23.  - Consider iron supplementation after post op recovery period  - AM CBC

## 2023-09-25 NOTE — Progress Notes (Deleted)
POST OPERATIVE OFFICE NOTE    CC:  F/u for surgery  HPI:  This is a 72 y.o. male who is s/p Redo exposure of right CFA with right CFA to peroneal artery bypass with ipsilateral reversed GSV on 08/09/2023 by Dr. Chestine Spore for CLI with tissue loss.   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 Lenell Antu. He has history  of right EIA stenting in 2018 by Dr. Edilia Bo and right femoral endarterectomy in April of 2021 by Dr. Chestine Spore.   Pt returns today for follow up.  Pt states ***   Allergies  Allergen Reactions   Glipizide Other (See Comments)    REACTION IS SIDE EFFECT Severe hypoglycemia to 40s.     No current facility-administered medications for this visit.   No current outpatient medications on file.   Facility-Administered Medications Ordered in Other Visits  Medication Dose Route Frequency Provider Last Rate Last Admin   0.9 %  sodium chloride infusion   Intravenous Continuous Maeola Harman, MD 100 mL/hr at 09/25/23 0550 New Bag at 09/25/23 0550   acetaminophen (TYLENOL) tablet 650 mg  650 mg Oral Q6H Everhart, Kirstie, DO   650 mg at 09/25/23 0546   albuterol (PROVENTIL) (2.5 MG/3ML) 0.083% nebulizer solution 3 mL  3 mL Inhalation Q6H PRN Everhart, Kirstie, DO       amLODipine (NORVASC) tablet 5 mg  5 mg Oral QHS Everhart, Kirstie, DO   5 mg at 09/24/23 2215   aspirin EC tablet 81 mg  81 mg Oral Daily Everhart, Kirstie, DO   81 mg at 09/24/23 0858   atorvastatin (LIPITOR) tablet 40 mg  40 mg Oral Daily Everhart, Kirstie, DO   40 mg at 09/24/23 0858   ezetimibe (ZETIA) tablet 10 mg  10 mg Oral Daily Everhart, Kirstie, DO   10 mg at 09/24/23 0858   feeding supplement (ENSURE ENLIVE / ENSURE PLUS) liquid 237 mL  237 mL Oral BID BM Chambliss, Marshall L, MD   237 mL at 09/24/23 0859   FLUoxetine (PROZAC) capsule 10 mg  10 mg Oral Daily Everhart, Kirstie, DO   10 mg at 09/24/23 0858   gabapentin (NEURONTIN) capsule 600 mg  600 mg Oral BID Everhart, Kirstie, DO   600 mg at  09/24/23 2215   nebivolol (BYSTOLIC) tablet 10 mg  10 mg Oral Daily Everhart, Kirstie, DO   10 mg at 09/24/23 0858   nicotine (NICODERM CQ - dosed in mg/24 hr) patch 7 mg  7 mg Transdermal Daily Everhart, Kirstie, DO   7 mg at 09/24/23 0859   oxyCODONE (Oxy IR/ROXICODONE) immediate release tablet 7.5 mg  7.5 mg Oral Q4H PRN Alfredo Martinez, MD   7.5 mg at 09/25/23 0546   pantoprazole (PROTONIX) EC tablet 40 mg  40 mg Oral Daily Everhart, Kirstie, DO   40 mg at 09/24/23 0858   polyethylene glycol (MIRALAX / GLYCOLAX) packet 17 g  17 g Oral Daily Everhart, Kirstie, DO   17 g at 09/24/23 0859     ROS:  See HPI  Physical Exam:  ***  Incision:  *** Extremities:  *** Neuro: *** Abdomen:  ***    Assessment/Plan:  This is a 72 y.o. male who is s/p: Redo exposure of right CFA with right CFA to peroneal artery bypass with ipsilateral reversed GSV on 08/09/2023 by Dr. Chestine Spore for CLI with tissue loss.   -***   Doreatha Massed, [facility] [phone]   Clinic  MD:  Chestine Spore

## 2023-09-25 NOTE — Hospital Course (Addendum)
[name] is a 72 y.o.male with a history of PAD and DM neuropathy presented from Podiatry for gangrene of right toes and was admitted to [facility] Medicine Teaching service. His hospital course is detailed below:  Gangrenous R toes (2, 3)/ SP R Above Knee Amputation On arrival, patient was afebrile without leukocytosis with necrosis of R 2nd/3rd toes.  Vascular surgery and podiatry teams were consulted and followed the patient.  Empiric abx discontinued given clinical stability. Patient with right ABI: 0.42. Right LE Art US demonstrated 50-74% stenosis of common femoral artery and low velocities of graft.  RLE angiogram demonstrated threatened right common femoral to peroneal artery bypass. VSS performed R AKA. PT/OT recommended CIR, but the patient did not meet medical necessity for inpatient rehab. PT evaluation recommended SNF. Vascular Surgery advised lifelong aspirin and atorvastatin.  Other chronic conditions were medically managed with home medications and formulary alternatives as necessary: HTN - held amlodipine 5mg  every day, nebivolol 10mg  QD HLD - continue atorvastatin 40mg  QD, zetia 10mg  every day Diabetic neuropathy - continue gabapentin 600mg  BID Mood - continue fluoxetine 10 QD  PCP Follow-up Recommendations: Follow up wound healing and pain control. Vascular surgery appointment in 4 weeks for staple removal. Ensure adequate BP control. Medications were held in the setting of hypotension throughout hospitalization.

## 2023-09-25 NOTE — Assessment & Plan Note (Addendum)
Takes metformin, last A1c 6.4 about a month ago. CBG range 116-176 over last day. D/ced SSI given minimal insulin use. CBGs per floor protocol. Carb modified diet.

## 2023-09-26 ENCOUNTER — Encounter (HOSPITAL_COMMUNITY): Payer: Self-pay | Admitting: Vascular Surgery

## 2023-09-26 DIAGNOSIS — I96 Gangrene, not elsewhere classified: Secondary | ICD-10-CM | POA: Diagnosis not present

## 2023-09-26 LAB — CBC
HCT: 31 % — ABNORMAL LOW (ref 39.0–52.0)
Hemoglobin: 9 g/dL — ABNORMAL LOW (ref 13.0–17.0)
MCH: 21.2 pg — ABNORMAL LOW (ref 26.0–34.0)
MCHC: 29 g/dL — ABNORMAL LOW (ref 30.0–36.0)
MCV: 73.1 fL — ABNORMAL LOW (ref 80.0–100.0)
Platelets: 282 10*3/uL (ref 150–400)
RBC: 4.24 MIL/uL (ref 4.22–5.81)
RDW: 22.5 % — ABNORMAL HIGH (ref 11.5–15.5)
WBC: 7.6 10*3/uL (ref 4.0–10.5)
nRBC: 0 % (ref 0.0–0.2)

## 2023-09-26 LAB — GLUCOSE, CAPILLARY
Glucose-Capillary: 152 mg/dL — ABNORMAL HIGH (ref 70–99)
Glucose-Capillary: 140 mg/dL — ABNORMAL HIGH (ref 70–99)

## 2023-09-26 LAB — HEPARIN LEVEL (UNFRACTIONATED)
Heparin Unfractionated: 0.21 [IU]/mL — ABNORMAL LOW (ref 0.30–0.70)
Heparin Unfractionated: 0.27 [IU]/mL — ABNORMAL LOW (ref 0.30–0.70)

## 2023-09-26 MED ORDER — JUVEN PO PACK
1.0000 | PACK | Freq: Two times a day (BID) | ORAL | Status: DC
  Administered 2023-09-28 – 2023-10-03 (×11): 1 via ORAL
  Filled 2023-09-26 (×12): qty 1

## 2023-09-26 MED ORDER — GABAPENTIN 300 MG PO CAPS
600.0000 mg | ORAL_CAPSULE | Freq: Three times a day (TID) | ORAL | Status: DC
  Administered 2023-09-26 – 2023-10-08 (×34): 600 mg via ORAL
  Filled 2023-09-26 (×34): qty 2

## 2023-09-26 NOTE — Progress Notes (Signed)
ANTICOAGULATION CONSULT NOTE  Pharmacy Consult for Heparin Indication:  threatened bypass  Allergies  Allergen Reactions   Glipizide Other (See Comments)    REACTION IS SIDE EFFECT Severe hypoglycemia to 40s.     Patient Measurements: Height: 5\' 7"  (170.2 cm) Weight: 82.6 kg (182 lb 1.6 oz) IBW/kg (Calculated) : 66.1 Heparin Dosing Weight: 83 kg  Vital Signs: Temp: 98.1 F (36.7 C) (10/01 1121) Temp Source: Oral (10/01 1121) BP: 106/68 (10/01 1121) Pulse Rate: 81 (10/01 1121)  Labs: Recent Labs    09/24/23 0426 09/25/23 0456 09/26/23 0439 09/26/23 1207  HGB 9.8* 10.0* 9.0*  --   HCT 34.0* 34.7* 31.0*  --   PLT 340 352 282  --   HEPARINUNFRC 0.47 0.42 0.21* 0.27*  CREATININE  --  0.76  --   --     Estimated Creatinine Clearance: 84.6 mL/min (by C-G formula based on SCr of 0.76 mg/dL).   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 male with history of PAD not on anticoagulation PTA who is admitted for gangrene of right toes. VVS following, imaging shows threatened right leg bypass with very sluggish  flow.  Patient is s/p lower extremity angiogram 9/30 with plans for R AKA 10/2. Pharmacy consulted to manage heparin.   Heparin level subtherapeutic on 1500 units/hr.  No bleeding noted.  Goal of Therapy:  Heparin level 0.3-0.7 units/ml Monitor platelets by anticoagulation protocol: Yes   Plan:  Increase Heparin to 1600 units / hr Heparin to stop at midnight in anticipation of surgery. Continue to monitor H&H and platelets  Thank you for allowing pharmacy to be a part of this patient's care.  [employer], Pharm.D., BCPS Clinical Pharmacist  09/26/2023 3:35 PM   Please check AMION for all [facility] Pharmacy phone numbers After 10:00 PM, call Main Pharmacy [phone]

## 2023-09-26 NOTE — Assessment & Plan Note (Addendum)
Pain today is slightly worse, given poor vascularization and need for amputation we will increase his gabapentin to 3 times daily as this usually has more efficacy for gangrene pain.  Vascular surgery has recommended an above-the-knee amputation to be done tomorrow patient will be n.p.o. after midnight. - Tylenol 650 mg q6h scheduled  - Oxycodone 7.5 q4 PRN for pain - Gabapentin 600mg  3 times daily - Heparin gtt stopped midnight in anticipation of surgery - on ASA, atorvastatin for PAD - AM CBC

## 2023-09-26 NOTE — Progress Notes (Signed)
ANTICOAGULATION CONSULT NOTE - Follow Up Consult  Pharmacy Consult for heparin Indication:  threatened bypass  Labs: Recent Labs    09/23/23 0820 09/24/23 0426 09/24/23 0426 09/25/23 0456 09/26/23 0439  HGB  --  9.8*   < > 10.0* 9.0*  HCT  --  34.0*  --  34.7* 31.0*  PLT  --  340  --  352 282  HEPARINUNFRC  --  0.47  --  0.42 0.21*  CREATININE 0.51*  --   --  0.76  --    < > = values in this interval not displayed.    Assessment: 72yo male subtherapeutic on heparin after resumed post-op; no infusion issues or signs of bleeding per RN.  Goal of Therapy:  Heparin level 0.3-0.7 units/ml   Plan:  Increase heparin infusion slightly to 1500 units/hr. Check level in 6 hours.   Vernard Gambles, PharmD, BCPS 09/26/2023 6:04 AM

## 2023-09-26 NOTE — Progress Notes (Signed)
[facility] [place]  Subjective  -states his right foot continues to hurt.  No other complaints including no chest pain or left arm pain.   Objective 110/78 85 98.2 F (36.8 C) (Oral) 20 94%  Intake/Output Summary (Last 24 hours) at 09/26/2023 0654 Last data filed at 09/26/2023 0604 Gross per 24 hour  Intake 988.18 ml  Output 1130 ml  Net -141.82 ml    Right groin with some hematoma after transfemoral access Right foot with necrotic toes as previously pictured  Laboratory Lab Results: Recent Labs    09/25/23 0456 09/26/23 0439  WBC 6.2 7.6  HGB 10.0* 9.0*  HCT 34.7* 31.0*  PLT 352 282   BMET Recent Labs    09/23/23 0820 09/25/23 0456  NA 133* 136  K 4.4 4.1  CL 97* 98  CO2 27 30  GLUCOSE 129* 123*  BUN 8 7*  CREATININE 0.51* 0.76  CALCIUM 8.9 8.7*    COAG Lab Results  Component Value Date   INR 1.2 08/09/2023   INR 1.1 12/05/2020   INR 1.1 04/24/2020   No results found for: "PTT"  Assessment/Planning:  72 year old male with progressive tissue loss of the right lower extremity consistent with critical limb ischemia.  He has had a right common femoral to peroneal bypass that is patent.  His bypass remains patent although he has no outflow into the foot and the bypass looks quite diminutive on arteriography.  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  I discussed with him today I really do not think he has any chance of healing a TMA.  I discussed the most this definitive option for him would be an above-knee amputation.  States he is tired of being in the hospital and tired of the pain.  Will tentatively post for tomorrow for right above-knee amputation.  Please keep n.p.o. after midnight.  Cephus Shelling 09/26/2023 6:54 AM --

## 2023-09-26 NOTE — Assessment & Plan Note (Signed)
>>  ASSESSMENT AND PLAN FOR ANEMIA WRITTEN ON 09/26/2023  1:53 PM BY MILLER, SAMANTHA, DO  Hemoglobin remains stable, will need to recheck after surgery and consider iron supplementation at that time.

## 2023-09-26 NOTE — Progress Notes (Signed)
Initial Nutrition Assessment  DOCUMENTATION CODES:   Not applicable  INTERVENTION:  Liberalize diet to regular to promote continued adequate PO intake Ensure Enlive po BID, each supplement provides 350 kcal and 20 grams of protein. Juven BID to support wound healing  NUTRITION DIAGNOSIS:   Increased nutrient needs related to wound healing as evidenced by estimated needs  GOAL:   Patient will meet greater than or equal to 90% of their needs  MONITOR:   PO intake, Supplement acceptance, Labs, Weight trends, Skin  REASON FOR ASSESSMENT:   Malnutrition Screening Tool    ASSESSMENT:   Pt admitted with gangrene of R toes. PMH significant for PAD and DM neuropathy.   9/30: s/p RLE angiogram  Vascular Surgery plans for R AKA tomorrow.   Spoke with pt at bedside. He endorses having a good appetite and eating 100% of 3 meals during admission. Prior to admission, he initially reports that his appetite has been poor x1 month with a 50% decrease in po intake. Upon further questioning about pt's reported weight loss, he states that his appetite and intake has been poor for the last few months but cannot recall specifically how long.   Denies difficulty chewing/swallowing, is having regular BM's. He endorses consuming Ensure BID. Discussed additional supplements to support wound healing and pt is agreeable to trying Juven.   Pt states that he has encountered a weight loss of 30 lbs since difficulty with leg pain which he recalls has been about 8 months. Reviewed weight history. It appears that pt has had a weight loss of 7.8% between 02/29-08/21 which is not clinically significant for time frame. Suspect admission weight is likely stated/pulled from prior encounter. Would benefit from updated measured weight to assess for ongoing changes.   Medications: protonix, miralax, NaCl @ 144ml/hr  Labs: BUN 7, CBG's 122-176 x24 hours  NUTRITION - FOCUSED PHYSICAL EXAM:  Flowsheet Row Most  Recent Value  Orbital Region No depletion  Upper Arm Region Moderate depletion  Thoracic and Lumbar Region No depletion  Buccal Region No depletion  Temple Region No depletion  Clavicle Bone Region Mild depletion  Clavicle and Acromion Bone Region No depletion  Scapular Bone Region No depletion  Dorsal Hand Moderate depletion  Patellar Region Mild depletion  [L AKA]  Anterior Thigh Region Moderate depletion  Posterior Calf Region Unable to assess  [edema + painful]  Edema (RD Assessment) Mild  [RLE]  Hair Reviewed  Eyes Reviewed  Mouth Reviewed  Skin Reviewed  Nails Reviewed       Diet Order:   Diet Order             Diet NPO time specified Except for: Sips with Meds  Diet effective midnight           Diet regular Room service appropriate? Yes; Fluid consistency: Thin  Diet effective now                   EDUCATION NEEDS:   Education needs have been addressed  Skin:  Skin Assessment: Skin Integrity Issues: Skin Integrity Issues:: Other (Comment) Other: open R toe wound/gangrene  Last BM:  9/29 (type 4)  Height:   Ht Readings from Last 1 Encounters:  09/21/23 5\' 7"  (1.702 m)    Weight:   Wt Readings from Last 1 Encounters:  09/21/23 82.6 kg   BMI:  Body mass index is 28.52 kg/m.  Estimated Nutritional Needs:   Kcal:  2000-2200  Protein:  110-125g  Fluid:  >/=  2L  Drusilla Kanner, RDN, LDN Clinical Nutrition

## 2023-09-26 NOTE — Assessment & Plan Note (Addendum)
Hemoglobin remains stable, will need to recheck after surgery and consider iron supplementation at that time.

## 2023-09-26 NOTE — Assessment & Plan Note (Addendum)
Patient's blood sugars remain acceptable, recommend restarting home regimen on discharge.

## 2023-09-26 NOTE — Progress Notes (Addendum)
Daily Progress Note Intern Pager: [phone]  Patient name: [name] Medical record number: [id number] Date of birth: July 31, 1951 Age: 72 y.o. Gender: male  Primary Care Provider: Erick Alley, DO Consultants: Vascular, podiatry Code Status: full  Pt Overview and Major Events to Date:  9/26: Admitted  Assessment and Plan:  [name] is a 72 year old gentleman admitted from outside podiatry office for gangrene of right second and third digits.  He has a history of peripheral artery disease s/p bypass diabetes mellitus with neuropathy.  Following RLE angiogram yesterday vascular believe his best course of action will be an above-knee amputation.  Patient understands and agrees with this course of action. Assessment & Plan Gangrene of toe of right foot (HCC) Pain today is slightly worse, given poor vascularization and need for amputation we will increase his gabapentin to 3 times daily as this usually has more efficacy for gangrene pain.  Vascular surgery has recommended an above-the-knee amputation to be done tomorrow patient will be n.p.o. after midnight. - Tylenol 650 mg q6h scheduled  - Oxycodone 7.5 q4 PRN for pain - Gabapentin 600mg  3 times daily - Heparin gtt stopped midnight in anticipation of surgery - on ASA, atorvastatin for PAD - AM CBC  Anemia Hemoglobin remains stable, will need to recheck after surgery and consider iron supplementation at that time. 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   Chronic and Stable Problems:  HTN-continue amlodipine 5 mg, nebivolol 10 mg HLD-continue atorvastatin and Zetia Neuropathy-continue gabapentin at adjusted dose seen above Mood-continue home fluoxetine   FEN/GI: Carb modified diet PPx: Holding heparin after midnight Dispo:Home  pending surgical management right foot .   Subjective:   Patient states he has good pain control at rest but anytime someone touches his foot he gets a jolt which lasts for several minutes.  He continues have difficulty sleeping.  Denies chest pain shortness of breath headache or other concerning symptoms.  Tolerating p.o. diet well  Objective: Temp:  [98.1 F (36.7 C)-98.7 F (37.1 C)] 98.1 F (36.7 C) (10/01 0700) Pulse Rate:  [67-85] 85 (10/01 0326) Resp:  [10-23] 20 (10/01 0326) BP: (71-174)/(36-136) 174/111 (10/01 0700) SpO2:  [85 %-100 %] 94 % (10/01 0326) Physical Exam: General: Well-appearing, no obvious distress.  Resting comfortably Cardiovascular: Giller rate and rhythm, no M/R/G Respiratory: CTAB, appropriate work of breathing, good air movement bilaterally Abdomen: Flat soft nontender Extremities: Right leg with gangrenous second and third digits.  Diminished peripheral pulses in BLE.   Laboratory: Most recent CBC Lab Results  Component Value Date   WBC 7.6 09/26/2023   HGB 9.0 (L) 09/26/2023   HCT 31.0 (L) 09/26/2023   MCV 73.1 (L) 09/26/2023   PLT 282 09/26/2023   Most recent BMP    Latest Ref Rng & Units 09/25/2023    4:56 AM  BMP  Glucose 70 - 99 mg/dL 696   BUN 8 - 23 mg/dL 7   Creatinine 2.95 - 2.84 mg/dL 1.32   Sodium 440 - 102 mmol/L 136   Potassium 3.5 - 5.1 mmol/L 4.1   Chloride 98 - 111 mmol/L 98   CO2 22 - 32 mmol/L 30   Calcium 8.9 - 10.3 mg/dL 8.7     Other pertinent labs none  Gerrit Heck, DO 09/26/2023, 8:21 AM  PGY-1, [facility] FPTS Intern pager: [phone], text pages welcome Secure chat group [facility] Family  Medicine [facility] Teaching Service

## 2023-09-26 NOTE — Progress Notes (Signed)
   Plan for above knee amputation tomorrow w/ Vascular Surgery. Podiatry will sign off. Please reconsult if needed   Felecia Shelling, DPM [facility]  Dr. Felecia Shelling, DPM    [address] [place], [place] [address]                Office [phone]  Fax [phone]

## 2023-09-27 ENCOUNTER — Encounter (HOSPITAL_COMMUNITY)
Admission: EM | Disposition: A | Discharge status: Skilled Nursing Facility | Payer: Self-pay | Source: Ambulatory Visit | Attending: Family Medicine

## 2023-09-27 ENCOUNTER — Inpatient Hospital Stay (HOSPITAL_COMMUNITY): Payer: Medicare HMO | Admitting: Registered Nurse

## 2023-09-27 ENCOUNTER — Encounter (HOSPITAL_COMMUNITY): Payer: Self-pay | Admitting: Family Medicine

## 2023-09-27 ENCOUNTER — Other Ambulatory Visit: Payer: Self-pay

## 2023-09-27 DIAGNOSIS — E1169 Type 2 diabetes mellitus with other specified complication: Secondary | ICD-10-CM

## 2023-09-27 DIAGNOSIS — I96 Gangrene, not elsewhere classified: Secondary | ICD-10-CM

## 2023-09-27 HISTORY — PX: AMPUTATION: SHX166

## 2023-09-27 LAB — CBC
HCT: 29.9 % — ABNORMAL LOW (ref 39.0–52.0)
Hemoglobin: 8.6 g/dL — ABNORMAL LOW (ref 13.0–17.0)
MCH: 21.4 pg — ABNORMAL LOW (ref 26.0–34.0)
MCHC: 28.8 g/dL — ABNORMAL LOW (ref 30.0–36.0)
MCV: 74.6 fL — ABNORMAL LOW (ref 80.0–100.0)
Platelets: 280 10*3/uL (ref 150–400)
RBC: 4.01 MIL/uL — ABNORMAL LOW (ref 4.22–5.81)
RDW: 22.7 % — ABNORMAL HIGH (ref 11.5–15.5)
WBC: 6.4 10*3/uL (ref 4.0–10.5)
nRBC: 0 % (ref 0.0–0.2)

## 2023-09-27 LAB — GLUCOSE, CAPILLARY
Glucose-Capillary: 105 mg/dL — ABNORMAL HIGH (ref 70–99)
Glucose-Capillary: 111 mg/dL — ABNORMAL HIGH (ref 70–99)
Glucose-Capillary: 169 mg/dL — ABNORMAL HIGH (ref 70–99)

## 2023-09-27 LAB — HEPARIN LEVEL (UNFRACTIONATED): Heparin Unfractionated: 0.46 [IU]/mL (ref 0.30–0.70)

## 2023-09-27 SURGERY — AMPUTATION, ABOVE KNEE
Anesthesia: General | Site: Knee | Laterality: Right

## 2023-09-27 MED ORDER — OXYCODONE HCL 5 MG PO TABS
ORAL_TABLET | ORAL | Status: AC
  Filled 2023-09-27: qty 2

## 2023-09-27 MED ORDER — INSULIN ASPART 100 UNIT/ML IJ SOLN: 0.0000 [IU] | INTRAMUSCULAR | Status: DC | PRN

## 2023-09-27 MED ORDER — 0.9 % SODIUM CHLORIDE (POUR BTL) OPTIME
TOPICAL | Status: DC | PRN
  Administered 2023-09-27: 2000 mL

## 2023-09-27 MED ORDER — BACITRACIN ZINC 500 UNIT/GM EX OINT
TOPICAL_OINTMENT | CUTANEOUS | Status: DC | PRN
Start: 2023-09-27 — End: 2023-09-27
  Administered 2023-09-27: 1 via TOPICAL

## 2023-09-27 MED ORDER — OXYCODONE HCL 5 MG PO TABS
10.0000 mg | ORAL_TABLET | Freq: Once | ORAL | Status: AC
  Administered 2023-09-27: 10 mg via ORAL

## 2023-09-27 MED ORDER — EPHEDRINE SULFATE-NACL 50-0.9 MG/10ML-% IV SOSY
PREFILLED_SYRINGE | INTRAVENOUS | Status: DC | PRN
  Administered 2023-09-27: 5 mg via INTRAVENOUS

## 2023-09-27 MED ORDER — SUGAMMADEX SODIUM 200 MG/2ML IV SOLN
INTRAVENOUS | Status: DC | PRN
  Administered 2023-09-27: 200 mg via INTRAVENOUS

## 2023-09-27 MED ORDER — ONDANSETRON HCL 4 MG/2ML IJ SOLN
INTRAMUSCULAR | Status: AC
  Filled 2023-09-27: qty 2

## 2023-09-27 MED ORDER — FENTANYL CITRATE (PF) 250 MCG/5ML IJ SOLN
INTRAMUSCULAR | Status: DC | PRN
  Administered 2023-09-27 (×2): 50 ug via INTRAVENOUS

## 2023-09-27 MED ORDER — VASOPRESSIN 20 UNIT/ML IV SOLN
INTRAVENOUS | Status: AC
  Filled 2023-09-27: qty 1

## 2023-09-27 MED ORDER — ROCURONIUM BROMIDE 10 MG/ML (PF) SYRINGE
PREFILLED_SYRINGE | INTRAVENOUS | Status: DC | PRN
  Administered 2023-09-27: 70 mg via INTRAVENOUS

## 2023-09-27 MED ORDER — DOCUSATE SODIUM 100 MG PO CAPS
100.0000 mg | ORAL_CAPSULE | Freq: Every day | ORAL | Status: DC
  Administered 2023-09-28 – 2023-10-03 (×6): 100 mg via ORAL
  Filled 2023-09-27 (×6): qty 1

## 2023-09-27 MED ORDER — ONDANSETRON HCL 4 MG/2ML IJ SOLN: 4.0000 mg | Freq: Four times a day (QID) | INTRAMUSCULAR | Status: DC | PRN

## 2023-09-27 MED ORDER — ACETAMINOPHEN 10 MG/ML IV SOLN
INTRAVENOUS | Status: AC
  Filled 2023-09-27: qty 100

## 2023-09-27 MED ORDER — FENTANYL CITRATE (PF) 100 MCG/2ML IJ SOLN
INTRAMUSCULAR | Status: AC
  Filled 2023-09-27: qty 2

## 2023-09-27 MED ORDER — HEPARIN (PORCINE) 25000 UT/250ML-% IV SOLN: 1600.0000 [IU]/h | INTRAVENOUS | Status: DC

## 2023-09-27 MED ORDER — BACITRACIN ZINC 500 UNIT/GM EX OINT
TOPICAL_OINTMENT | CUTANEOUS | Status: AC
  Filled 2023-09-27: qty 28.35

## 2023-09-27 MED ORDER — FENTANYL CITRATE (PF) 100 MCG/2ML IJ SOLN
25.0000 ug | INTRAMUSCULAR | Status: DC | PRN
  Administered 2023-09-27 (×2): 25 ug via INTRAVENOUS
  Administered 2023-09-27: 50 ug via INTRAVENOUS
  Administered 2023-09-27 (×2): 25 ug via INTRAVENOUS

## 2023-09-27 MED ORDER — PHENYLEPHRINE 80 MCG/ML (10ML) SYRINGE FOR IV PUSH (FOR BLOOD PRESSURE SUPPORT)
PREFILLED_SYRINGE | INTRAVENOUS | Status: DC | PRN
  Administered 2023-09-27: 240 ug via INTRAVENOUS
  Administered 2023-09-27 (×3): 160 ug via INTRAVENOUS
  Administered 2023-09-27: 240 ug via INTRAVENOUS
  Administered 2023-09-27 (×2): 160 ug via INTRAVENOUS
  Administered 2023-09-27: 80 ug via INTRAVENOUS
  Administered 2023-09-27: 120 ug via INTRAVENOUS
  Administered 2023-09-27: 160 ug via INTRAVENOUS
  Administered 2023-09-27: 80 ug via INTRAVENOUS

## 2023-09-27 MED ORDER — EPHEDRINE 5 MG/ML INJ
INTRAVENOUS | Status: AC
  Filled 2023-09-27: qty 5

## 2023-09-27 MED ORDER — DROPERIDOL 2.5 MG/ML IJ SOLN: 0.6250 mg | Freq: Once | INTRAMUSCULAR | Status: DC | PRN

## 2023-09-27 MED ORDER — PROPOFOL 10 MG/ML IV BOLUS
INTRAVENOUS | Status: AC
  Filled 2023-09-27: qty 20

## 2023-09-27 MED ORDER — HEPARIN SODIUM (PORCINE) 5000 UNIT/ML IJ SOLN
5000.0000 [IU] | Freq: Three times a day (TID) | INTRAMUSCULAR | Status: DC
  Administered 2023-09-28 – 2023-10-08 (×31): 5000 [IU] via SUBCUTANEOUS
  Filled 2023-09-27 (×31): qty 1

## 2023-09-27 MED ORDER — SODIUM CHLORIDE 0.9 % IV SOLN: INTRAVENOUS | Status: DC

## 2023-09-27 MED ORDER — ROCURONIUM BROMIDE 10 MG/ML (PF) SYRINGE
PREFILLED_SYRINGE | INTRAVENOUS | Status: AC
  Filled 2023-09-27: qty 10

## 2023-09-27 MED ORDER — ONDANSETRON HCL 4 MG/2ML IJ SOLN
INTRAMUSCULAR | Status: DC | PRN
  Administered 2023-09-27: 4 mg via INTRAVENOUS

## 2023-09-27 MED ORDER — FENTANYL CITRATE (PF) 250 MCG/5ML IJ SOLN
INTRAMUSCULAR | Status: AC
  Filled 2023-09-27: qty 5

## 2023-09-27 MED ORDER — ACETAMINOPHEN 10 MG/ML IV SOLN
1000.0000 mg | Freq: Once | INTRAVENOUS | Status: DC | PRN
  Administered 2023-09-27: 1000 mg via INTRAVENOUS

## 2023-09-27 MED ORDER — PROPOFOL 10 MG/ML IV BOLUS
INTRAVENOUS | Status: DC | PRN
  Administered 2023-09-27: 120 mg via INTRAVENOUS

## 2023-09-27 MED ORDER — ALBUMIN HUMAN 5 % IV SOLN
INTRAVENOUS | Status: DC | PRN
Start: 2023-09-27 — End: 2023-09-27

## 2023-09-27 MED ORDER — PHENYLEPHRINE 80 MCG/ML (10ML) SYRINGE FOR IV PUSH (FOR BLOOD PRESSURE SUPPORT)
PREFILLED_SYRINGE | INTRAVENOUS | Status: AC
  Filled 2023-09-27: qty 20

## 2023-09-27 MED ORDER — LACTATED RINGERS IV SOLN: INTRAVENOUS | Status: DC

## 2023-09-27 MED ORDER — PHENYLEPHRINE HCL-NACL 20-0.9 MG/250ML-% IV SOLN
INTRAVENOUS | Status: DC | PRN
  Administered 2023-09-27: 25 ug/min via INTRAVENOUS

## 2023-09-27 MED ORDER — LIDOCAINE 2% (20 MG/ML) 5 ML SYRINGE
INTRAMUSCULAR | Status: DC | PRN
  Administered 2023-09-27: 80 mg via INTRAVENOUS

## 2023-09-27 MED ORDER — CHLORHEXIDINE GLUCONATE 0.12 % MT SOLN
15.0000 mL | OROMUCOSAL | Status: AC
  Administered 2023-09-27: 15 mL via OROMUCOSAL
  Filled 2023-09-27 (×2): qty 15

## 2023-09-27 MED ORDER — LIDOCAINE 2% (20 MG/ML) 5 ML SYRINGE
INTRAMUSCULAR | Status: AC
  Filled 2023-09-27: qty 5

## 2023-09-27 SURGICAL SUPPLY — 57 items
BAG COUNTER SPONGE SURGICOUNT (BAG) ×2 IMPLANT
BAG SPNG CNTER NS LX DISP (BAG) ×1
BLADE SAW SGTL 73X25 THK (BLADE) ×2 IMPLANT
BNDG CMPR 5X4 CHSV STRCH STRL (GAUZE/BANDAGES/DRESSINGS) ×1
BNDG CMPR 5X6 CHSV STRCH STRL (GAUZE/BANDAGES/DRESSINGS) ×1
BNDG COHESIVE 4X5 TAN STRL LF (GAUZE/BANDAGES/DRESSINGS) IMPLANT
BNDG COHESIVE 6X5 TAN ST LF (GAUZE/BANDAGES/DRESSINGS) ×2 IMPLANT
BNDG ELASTIC 4X5.8 VLCR STR LF (GAUZE/BANDAGES/DRESSINGS) ×2 IMPLANT
BNDG ELASTIC 6X5.8 VLCR STR LF (GAUZE/BANDAGES/DRESSINGS) ×2 IMPLANT
BNDG GAUZE DERMACEA FLUFF 4 (GAUZE/BANDAGES/DRESSINGS) ×4 IMPLANT
BNDG GZE DERMACEA 4 6PLY (GAUZE/BANDAGES/DRESSINGS) ×2
BUR DISC 0.8X25 (BURR) IMPLANT
CANISTER SUCT 3000ML PPV (MISCELLANEOUS) ×2 IMPLANT
CLIP TI MEDIUM 6 (CLIP) ×2 IMPLANT
COVER BACK TABLE 60X90IN (DRAPES) ×2 IMPLANT
COVER SURGICAL LIGHT HANDLE (MISCELLANEOUS) ×4 IMPLANT
DRAIN CHANNEL 19F RND (DRAIN) IMPLANT
DRAPE DERMATAC (DRAPES) IMPLANT
DRAPE HALF SHEET 40X57 (DRAPES) ×2 IMPLANT
DRAPE INCISE IOBAN 66X45 STRL (DRAPES) IMPLANT
DRAPE ORTHO SPLIT 77X108 STRL (DRAPES) ×2
DRAPE SURG ORHT 6 SPLT 77X108 (DRAPES) ×4 IMPLANT
DRAPE U-SHAPE 47X51 STRL (DRAPES) IMPLANT
DRESSING PREVENA PLUS CUSTOM (GAUZE/BANDAGES/DRESSINGS) IMPLANT
DRSG ADAPTIC 3X8 NADH LF (GAUZE/BANDAGES/DRESSINGS) ×2 IMPLANT
DRSG PREVENA PLUS CUSTOM (GAUZE/BANDAGES/DRESSINGS)
ELECT CAUTERY BLADE 6.4 (BLADE) ×2 IMPLANT
ELECT REM PT RETURN 9FT ADLT (ELECTROSURGICAL) ×1
ELECTRODE REM PT RTRN 9FT ADLT (ELECTROSURGICAL) ×2 IMPLANT
EVACUATOR SILICONE 100CC (DRAIN) IMPLANT
GAUZE SPONGE 4X4 12PLY STRL (GAUZE/BANDAGES/DRESSINGS) ×4 IMPLANT
GLOVE BIO SURGEON STRL SZ7.5 (GLOVE) ×2 IMPLANT
GLOVE BIOGEL PI IND STRL 8 (GLOVE) ×2 IMPLANT
GOWN STRL REUS W/ TWL XL LVL3 (GOWN DISPOSABLE) ×2 IMPLANT
GOWN STRL REUS W/TWL XL LVL3 (GOWN DISPOSABLE) ×1
KIT BASIN OR (CUSTOM PROCEDURE TRAY) ×2 IMPLANT
KIT TURNOVER KIT B (KITS) ×2 IMPLANT
NS IRRIG 1000ML POUR BTL (IV SOLUTION) ×2 IMPLANT
PACK GENERAL/GYN (CUSTOM PROCEDURE TRAY) ×2 IMPLANT
PAD ARMBOARD 7.5X6 YLW CONV (MISCELLANEOUS) ×4 IMPLANT
PREVENA RESTOR ARTHOFORM 46X30 (CANNISTER) IMPLANT
RASP HELIOCORDIAL MED (MISCELLANEOUS) IMPLANT
SPONGE T-LAP 18X18 ~~LOC~~+RFID (SPONGE) IMPLANT
STAPLER VISISTAT 35W (STAPLE) ×2 IMPLANT
STOCKINETTE IMPERVIOUS LG (DRAPES) ×2 IMPLANT
SUT BONE WAX W31G (SUTURE) ×2 IMPLANT
SUT ETHILON 3 0 PS 1 (SUTURE) IMPLANT
SUT PROLENE 5 0 C 1 24 (SUTURE) IMPLANT
SUT SILK 0 TIES 10X30 (SUTURE) ×2 IMPLANT
SUT SILK 2 0 (SUTURE) ×1
SUT SILK 2 0 SH CR/8 (SUTURE) ×2 IMPLANT
SUT SILK 2-0 18XBRD TIE 12 (SUTURE) ×2 IMPLANT
SUT VIC AB 2-0 CT1 18 (SUTURE) ×4 IMPLANT
SUT VIC AB 3-0 SH 18 (SUTURE) IMPLANT
TOWEL GREEN STERILE (TOWEL DISPOSABLE) ×4 IMPLANT
UNDERPAD 30X36 HEAVY ABSORB (UNDERPADS AND DIAPERS) ×2 IMPLANT
WATER STERILE IRR 1000ML POUR (IV SOLUTION) ×2 IMPLANT

## 2023-09-27 NOTE — Assessment & Plan Note (Signed)
H&H continues to trend downward we will reassess tomorrow morning following surgery and if necessary will intervene at that time

## 2023-09-27 NOTE — Anesthesia Procedure Notes (Signed)
Procedure Name: Intubation Date/Time: 09/27/2023 12:58 PM  Performed by: Sharyn Dross, CRNAPre-anesthesia Checklist: Patient identified, Emergency Drugs available, Suction available and Patient being monitored Patient Re-evaluated:Patient Re-evaluated prior to induction Oxygen Delivery Method: Circle system utilized Preoxygenation: Pre-oxygenation with 100% oxygen Induction Type: IV induction Ventilation: Mask ventilation without difficulty and Oral airway inserted - appropriate to patient size Grade View: Grade II Tube type: Oral Tube size: 7.5 mm Number of attempts: 1 Airway Equipment and Method: Stylet and Oral airway Placement Confirmation: ETT inserted through vocal cords under direct vision, positive ETCO2 and breath sounds checked- equal and bilateral Secured at: 23 cm Tube secured with: Tape Dental Injury: Teeth and Oropharynx as per pre-operative assessment  Comments: LMA attempt X 2 unsuccessful.

## 2023-09-27 NOTE — Assessment & Plan Note (Signed)
>>  ASSESSMENT AND PLAN FOR ANEMIA WRITTEN ON 09/27/2023 11:55 AM BY MILLER, SAMANTHA, DO  H&H continues to trend downward we will reassess tomorrow morning following surgery and if necessary will intervene at that time

## 2023-09-27 NOTE — Assessment & Plan Note (Signed)
Pain today is well-controlled with increased dose of gabapentin.  We will reassess his pain needs following surgery today and vascular surgery's recommendations moving forward.  This patient will likely need social work involvement for discharge planning to ensure that his mobility at home is adequate for him to continue with his ADLs. - Tylenol 650 mg q6h scheduled  - Oxycodone 7.5 q4 PRN for pain, likely need to reassess after surgery - Gabapentin 600mg  3 times daily, will likely need to reassess after surgery -Heparin restarted per vascular surgery's recommendations, they will stop at rollback - on ASA, atorvastatin for PAD - AM CBC

## 2023-09-27 NOTE — Progress Notes (Addendum)
  Progress Note    09/27/2023 6:48 AM 2 Days Post-Op  Subjective:  pt still having pain and wants to proceed with amputation.  He understands that it is above knee amputation.    Afebrile  Vitals:   09/26/23 2336 09/27/23 0440  BP: 127/70 110/72  Pulse: 71 76  Resp: 15 16  Temp: 98.8 F (37.1 C) 98.6 F (37 C)  SpO2: 96% 95%    Physical Exam: General:  resting in bed Lungs:  non labored   CBC    Component Value Date/Time   WBC 6.4 09/27/2023 0335   RBC 4.01 (L) 09/27/2023 0335   HGB 8.6 (L) 09/27/2023 0335   HGB 9.9 (L) 02/23/2023 1210   HCT 29.9 (L) 09/27/2023 0335   HCT 34.6 (L) 02/23/2023 1210   PLT 280 09/27/2023 0335   PLT 362 02/23/2023 1210   MCV 74.6 (L) 09/27/2023 0335   MCV 67 (L) 02/23/2023 1210   MCH 21.4 (L) 09/27/2023 0335   MCHC 28.8 (L) 09/27/2023 0335   RDW 22.7 (H) 09/27/2023 0335   RDW 18.3 (H) 02/23/2023 1210   LYMPHSABS 1.5 09/21/2023 1329   LYMPHSABS 1.7 03/03/2022 1615   MONOABS 0.7 09/21/2023 1329   EOSABS 0.2 09/21/2023 1329   EOSABS 0.2 03/03/2022 1615   BASOSABS 0.0 09/21/2023 1329   BASOSABS 0.1 03/03/2022 1615    BMET    Component Value Date/Time   NA 136 09/25/2023 0456   NA 136 02/23/2023 1210   K 4.1 09/25/2023 0456   CL 98 09/25/2023 0456   CO2 30 09/25/2023 0456   GLUCOSE 123 (H) 09/25/2023 0456   BUN 7 (L) 09/25/2023 0456   BUN 8 02/23/2023 1210   CREATININE 0.76 09/25/2023 0456   CREATININE 0.94 03/31/2015 1454   CALCIUM 8.7 (L) 09/25/2023 0456   GFRNONAA >60 09/25/2023 0456   GFRAA 106 11/30/2020 0944    INR    Component Value Date/Time   INR 1.2 08/09/2023 0700     Intake/Output Summary (Last 24 hours) at 09/27/2023 0648 Last data filed at 09/27/2023 0538 Gross per 24 hour  Intake 2831.9 ml  Output 700 ml  Net 2131.9 ml      Assessment/Plan:  72 y.o. male is s/p:  Angiogram 09/25/2023   2 Days Post-Op   -pt continues to have pain in the right foot.  He did not have options for  revascularization on angiogram.  He states he is ready to proceed with above knee amputation today.  Advised him to continue not to eat or drink. -DVT prophylaxis:  heparin gtt-please hold on call to OR.  Will place order.    Doreatha Massed, PA-C [facility] [phone] 09/27/2023 6:48 AM  I have seen and evaluated the patient. I agree with the PA note as documented above. Patient is agreeable to right above knee amputation today.  He has undergone right common femoral to peroneal bypass.  No further options for revascularization.  Worsening tissue loss and states he cannot tolerate the pain in his foot any longer.  Discussed yesterday I do not think he has enough inflow to heal TMA.  NPO.  OR today.  Cephus Shelling, MD [facility] [place] Office: [phone]

## 2023-09-27 NOTE — Assessment & Plan Note (Signed)
Patient's blood sugars remain acceptable, recommend restarting home regimen on discharge.

## 2023-09-27 NOTE — Progress Notes (Signed)
Family Medicine Teaching Service Attending Note  I personally interviewed and examined patient [name] and reviewed their tests and x-rays.  I discussed with Dr. Hyacinth Meeker and reviewed their note for today.  I agree with their assessment and plan.     Additionally  In good spirits ready for his surgery

## 2023-09-27 NOTE — Transfer of Care (Signed)
Immediate Anesthesia Transfer of Care Note  Patient: [name]  Procedure(s) Performed: RIGHT AMPUTATION ABOVE KNEE (Right: Knee)  Patient Location: PACU  Anesthesia Type:General  Level of Consciousness: drowsy and patient cooperative  Airway & Oxygen Therapy: Patient connected to face mask oxygen  Post-op Assessment: Report given to RN and Post -op Vital signs reviewed and stable  Post vital signs: Reviewed and stable  Last Vitals:  Vitals Value Taken Time  BP 88/67 09/27/23 1425  Temp 36.7 C 09/27/23 1418  Pulse 77 09/27/23 1426  Resp 16 09/27/23 1426  SpO2 100 % 09/27/23 1426  Vitals shown include unfiled device data.  Last Pain:  Vitals:   09/27/23 1224  TempSrc: Oral  PainSc:       Patients Stated Pain Goal: 0 (09/23/23 1610)  Complications: There were no known notable events for this encounter.

## 2023-09-27 NOTE — Progress Notes (Signed)
FMTS Brief Progress Note  S: Patient returned from PACU, went to room for postop check.  Patient was resting peacefully therefore I did not wake him.  O: BP (!) 116/56   Pulse 74   Temp 98.4 F (36.9 C) (Oral)   Resp 16   Ht 5\' 7"  (1.702 m)   Wt 82 kg   SpO2 100%   BMI 28.31 kg/m   General: Resting peacefully  A/P: Status post right AKA Resting comfortably. -Oxycodone 7.5 mg every 4 hours as needed for moderate to severe pain -Carb modified diet - Orders reviewed. Labs for AM ordered, which was adjusted as needed.   Shelby Mattocks, DO 09/27/2023, 10:39 PM PGY-3, [facility] Night Resident  Please page [phone] with questions.

## 2023-09-27 NOTE — Op Note (Signed)
    OPERATIVE NOTE  DATE: September 27, 2023  PROCEDURE: right above-the-knee amputation  PRE-OPERATIVE DIAGNOSIS: worsening right foot gangrene even with patent right leg bypass  POST-OPERATIVE DIAGNOSIS: same as above  SURGEON: Cephus Shelling, MD  ASSISTANT(S): Doreatha Massed, PA  ANESTHESIA: general  ESTIMATED BLOOD LOSS: 250 mL  FINDING(S): Right above-knee amputation with healthy appearing muscle.  The right common femoral to peroneal bypass was patent and this was ligated.  SPECIMEN(S):  right above-the-knee amputation  INDICATIONS:   [name] is a 72 y.o. male who presents with right foot gangrene.  The patient is scheduled for a right above-the-knee amputation.  I discussed in depth with the patient the risks, benefits, and alternatives to this procedure.  The patient is aware that the risk of this operation included but are not limited to:  bleeding, infection, myocardial infarction, stroke, death, failure to heal amputation wound, and possible need for more proximal amputation.  The patient is aware of the risks and agrees proceed forward with the procedure.  DESCRIPTION: After full informed written consent was obtained from the patient, the patient was brought back to the operating room, and placed supine upon the operating table.  Prior to induction, the patient received IV antibiotics.  The patient was then prepped and draped in the standard fashion for an above-the-knee amputation. I marked out the anterior and posterior flaps for a fish-mouth type of amputation.  I made the incisions for these flaps, and then dissected through the subcutaneous tissue, fascia, and muscles circumferentially.  I elevated  the periosteal tissue 4 cm more proximal than the anterior skin flap.  I then transected the femur with a power saw at this level.  The femoral vessels were ligated between [facility] clamps.  The previous common femoral to peroneal bypass was ligated with 2-0 silk and  also oversewn with a 5-0 Prolene.  This was patent at the time of amputation.  I completed the posterior flap with Bovie cautery.  At this point, the specimen was passed off the field as the above-the-knee amputation.  At this point, I clamped all visibly bleeding arteries and veins using a combination of suture ligation with suture and electrocautery.  Bleeding continued to be controlled with electrocautery and suture ligature.  The stump was washed off with sterile normal saline and no further active bleeding was noted.  I reapproximated the anterior and posterior fascia  with interrupted stitches of 2-0 Vicryl.  This was completed along the entire length of anterior and posterior fascia until there were no more loose space in the fascial line.  The skin was then  reapproximated with staples.  The stump was washed off and dried.  The incision was dressed with [employer] and  then fluffs were applied.  Kerlix was wrapped around the leg and then gently an ACE wrap was applied.    COMPLICATIONS: None  CONDITION: Stable  Cephus Shelling, MD [facility] Office: [phone]  Cephus Shelling   09/27/2023, 2:23 PM

## 2023-09-27 NOTE — Progress Notes (Signed)
ANTICOAGULATION CONSULT NOTE - Follow Up Consult  Pharmacy Consult for heparin Indication:  threatened bypass  Labs: Recent Labs    09/24/23 0426 09/25/23 0456 09/26/23 0439 09/26/23 1207 09/27/23 0015  HGB 9.8* 10.0* 9.0*  --   --   HCT 34.0* 34.7* 31.0*  --   --   PLT 340 352 282  --   --   HEPARINUNFRC 0.47 0.42 0.21* 0.27* 0.46  CREATININE  --  0.76  --   --   --     Assessment/Plan:  72yo male therapeutic on heparin after rat change. Will continue infusion at current rate of 1600 units/hr until off at 0800 for OR; will f/u after OR.  Vernard Gambles, PharmD, BCPS 09/27/2023 1:12 AM

## 2023-09-27 NOTE — Progress Notes (Signed)
2.5 mg oxycodone wasted in Med room B pyxis; witnessed by Maury Dus

## 2023-09-27 NOTE — Progress Notes (Signed)
Daily Progress Note Intern Pager: [phone]  Patient name: [name] Medical record number: [id number] Date of birth: December 09, 1951 Age: 72 y.o. Gender: male  Primary Care Provider: Erick Alley, DO Consultants: Vascular Surgery Code Status: Full  Pt Overview and Major Events to Date:  9/26: Admitted 10/2: AKA performed.  Assessment and Plan:  [name] is a 72 year old gentleman admitted from outside podiatry office for gangrene of the right second and third digits, who will be receiving an above-the-knee amputation today per vascular surgery's recommendations.  Once surgery is complete we will follow-up with them for recommendations.  [name] has a previous history of type 2 diabetes, hypertension, hyperlipidemia and diabetes associated peripheral neuropathy which have all likely contributed to his current condition. Assessment & Plan Gangrene of toe of right foot (HCC) Pain today is well-controlled with increased dose of gabapentin.  We will reassess his pain needs following surgery today and vascular surgery's recommendations moving forward.  This patient will likely need social work involvement for discharge planning to ensure that his mobility at home is adequate for him to continue with his ADLs. 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   Chronic and Stable Problems:   Hypertension Hyperlipidemia Neuropathy Mood  FEN/GI: NPO until after surgery, resume carb modified after. PPx: Heparin drip currently, will reassess need after surgery. Dispo:Home  pending surgical amputation of right lower limb . Barriers include safety and mobility concerns.   Subjective:  [name] was in good spirits this morning when I saw him, he was sitting up on the edge of the bed and had just had a brief sponge bath.  He had no questions or concerns about his surgery and understood that we would be managing his care after his amputation.  Objective: Temp:  [97.8 F (36.6 C)-98.8 F (37.1 C)] 98 F (36.7 C) (10/02 0832) Pulse Rate:  [66-76] 76 (10/02 0440) Resp:  [15-24] 24 (10/02 0832) BP: (82-129)/(61-87) 82/65 (10/02 0832) SpO2:  [95 %-100 %] 95 % (10/02 0440) Physical Exam: General: Alert, no apparent distress.  Sitting comfortably on the edge of the bed Cardiovascular: Regular rate and rhythm, no M/R/G Respiratory: CTAB, no increased work of breathing Abdomen: Flat, soft, nontender. Extremities: Left leg with AKA, no concerns about appearance of limb stump.  Right leg without peripheral edema or tenderness.  Poor peripheral pulses  Laboratory: Most recent CBC Lab Results  Component Value Date   WBC 6.4 09/27/2023   HGB 8.6 (L) 09/27/2023   HCT 29.9 (L) 09/27/2023   MCV 74.6 (L) 09/27/2023   PLT 280 09/27/2023   Most recent BMP    Latest Ref Rng & Units 09/25/2023    4:56 AM  BMP  Glucose 70 - 99 mg/dL 664   BUN 8 - 23 mg/dL 7   Creatinine 4.03 - 4.74 mg/dL 2.59   Sodium 563 - 875 mmol/L 136   Potassium 3.5 - 5.1 mmol/L 4.1   Chloride 98 - 111  mmol/L 98   CO2 22 - 32 mmol/L 30   Calcium 8.9 - 10.3 mg/dL 8.7     Brian Heck, DO 09/27/2023, 11:45 AM  PGY-1, [facility] FPTS Intern pager: [phone], text pages welcome Secure chat group [facility] Teaching Service

## 2023-09-27 NOTE — Anesthesia Postprocedure Evaluation (Signed)
Anesthesia Post Note  Patient: [name]  Procedure(s) Performed: RIGHT AMPUTATION ABOVE KNEE (Right: Knee)     Patient location during evaluation: PACU Anesthesia Type: General Level of consciousness: awake and alert Pain management: pain level controlled Vital Signs Assessment: post-procedure vital signs reviewed and stable Respiratory status: spontaneous breathing, nonlabored ventilation, respiratory function stable and patient connected to nasal cannula oxygen Cardiovascular status: blood pressure returned to baseline and stable Postop Assessment: no apparent nausea or vomiting Anesthetic complications: no   There were no known notable events for this encounter.  Last Vitals:  Vitals:   09/27/23 1615 09/27/23 1620  BP: 115/65 124/71  Pulse: 75 74  Resp: 17 18  Temp:    SpO2: 100% 100%    Last Pain:  Vitals:   09/27/23 1615  TempSrc:   PainSc: Asleep                 Addington Nation

## 2023-09-27 NOTE — Anesthesia Preprocedure Evaluation (Signed)
Anesthesia Evaluation  Patient identified by MRN, date of birth, ID band Patient awake    Reviewed: Allergy & Precautions, NPO status , Patient's Chart, lab work & pertinent test results, reviewed documented beta blocker date and time   Airway Mallampati: II  TM Distance: >3 FB     Dental  (+) Edentulous Upper, Edentulous Lower   Pulmonary shortness of breath and with exertion, pneumonia, resolved, Current Smoker and Patient abstained from smoking.   Pulmonary exam normal breath sounds clear to auscultation       Cardiovascular hypertension, Pt. on medications and Pt. on home beta blockers + Peripheral Vascular Disease  Normal cardiovascular exam Rhythm:Regular Rate:Normal  Critical limb ischemia RLE  EKG 07/27/2023 NSR w/ PACs, LVH, T wave Abn consider lateral ischemia  Echo 09/15/22 1. Left ventricular ejection fraction, by estimation, is 55%. The left  ventricle has normal function. The left ventricle has no regional wall  motion abnormalities. There is moderate left ventricular hypertrophy. Left  ventricular diastolic parameters are   indeterminate.   2. Right ventricular systolic function is normal. The right ventricular  size is normal. Tricuspid regurgitation signal is inadequate for assessing  PA pressure.   3. The mitral valve is normal in structure. Trivial mitral valve  regurgitation. No evidence of mitral stenosis.   4. The aortic valve is tricuspid. There is mild calcification of the  aortic valve. Aortic valve regurgitation is not visualized. Aortic valve  sclerosis is present, with no evidence of aortic valve stenosis.   5. The inferior vena cava is normal in size with greater than 50%  respiratory variability, suggesting right atrial pressure of 3 mmHg.      Neuro/Psych  PSYCHIATRIC DISORDERS  Depression    Peripheral neuropathy Neuromuscular disease    GI/Hepatic Neg liver ROS,GERD  Medicated,,  Endo/Other   diabetes, Well Controlled, Type 2, Oral Hypoglycemic Agents  Hyperlipidemia  Renal/GU negative Renal ROS  negative genitourinary   Musculoskeletal  (+) Arthritis , Osteoarthritis,  Cervical DDD   Abdominal   Peds  Hematology  (+) Blood dyscrasia, anemia Plavix therapy   Anesthesia Other Findings   Reproductive/Obstetrics                              Anesthesia Physical Anesthesia Plan  ASA: 3  Anesthesia Plan: General   Post-op Pain Management:    Induction: Intravenous  PONV Risk Score and Plan: 2 and Treatment may vary due to age or medical condition and Ondansetron  Airway Management Planned: LMA  Additional Equipment: Arterial line  Intra-op Plan:   Post-operative Plan: Extubation in OR  Informed Consent: I have reviewed the patients History and Physical, chart, labs and discussed the procedure including the risks, benefits and alternatives for the proposed anesthesia with the patient or authorized representative who has indicated his/her understanding and acceptance.       Plan Discussed with: Anesthesiologist and CRNA  Anesthesia Plan Comments:          Anesthesia Quick Evaluation

## 2023-09-28 ENCOUNTER — Encounter (HOSPITAL_COMMUNITY): Payer: Self-pay | Admitting: Vascular Surgery

## 2023-09-28 DIAGNOSIS — I96 Gangrene, not elsewhere classified: Secondary | ICD-10-CM | POA: Diagnosis not present

## 2023-09-28 LAB — BASIC METABOLIC PANEL
Anion gap: 8 (ref 5–15)
BUN: 6 mg/dL — ABNORMAL LOW (ref 8–23)
CO2: 29 mmol/L (ref 22–32)
Calcium: 8.6 mg/dL — ABNORMAL LOW (ref 8.9–10.3)
Chloride: 97 mmol/L — ABNORMAL LOW (ref 98–111)
Creatinine, Ser: 0.64 mg/dL (ref 0.61–1.24)
GFR, Estimated: >60 mL/min (ref >60)
Glucose, Bld: 148 mg/dL — ABNORMAL HIGH (ref 70–99)
Potassium: 4.3 mmol/L (ref 3.5–5.1)
Sodium: 134 mmol/L — ABNORMAL LOW (ref 135–145)

## 2023-09-28 LAB — CBC
HCT: 25.2 % — ABNORMAL LOW (ref 39.0–52.0)
Hemoglobin: 7.2 g/dL — ABNORMAL LOW (ref 13.0–17.0)
MCH: 21.1 pg — ABNORMAL LOW (ref 26.0–34.0)
MCHC: 28.6 g/dL — ABNORMAL LOW (ref 30.0–36.0)
MCV: 73.7 fL — ABNORMAL LOW (ref 80.0–100.0)
Platelets: 257 10*3/uL (ref 150–400)
RBC: 3.42 MIL/uL — ABNORMAL LOW (ref 4.22–5.81)
RDW: 21.9 % — ABNORMAL HIGH (ref 11.5–15.5)
WBC: 9.5 10*3/uL (ref 4.0–10.5)
nRBC: 0 % (ref 0.0–0.2)

## 2023-09-28 MED ORDER — OXYCODONE HCL 5 MG PO TABS: 2.5000 mg | ORAL_TABLET | Freq: Four times a day (QID) | ORAL | Status: DC | PRN

## 2023-09-28 NOTE — Assessment & Plan Note (Signed)
>>  ASSESSMENT AND PLAN FOR ANEMIA WRITTEN ON 09/28/2023  9:46 AM BY MABE, GERALD, MD  H&H continues to trend downward we will reassess tomorrow morning following surgery and if necessary will intervene at that time

## 2023-09-28 NOTE — Assessment & Plan Note (Addendum)
Pain today is well-controlled with increased dose of gabapentin.  We will reassess his pain needs following surgery today and vascular surgery's recommendations moving forward.  This patient will likely need social work involvement for discharge planning to ensure that his mobility at home is adequate for him to continue with his ADLs. - Tylenol 650 mg q6h scheduled  - Oxycodone 7.5 q4 PRN for pain, can consider 2.5 mg spot dosing as needed - Gabapentin 600mg  3 times daily -Heparin restarted per vascular surgery's recommendations, they will stop at rollback - on ASA, atorvastatin for PAD - AM CBC

## 2023-09-28 NOTE — Progress Notes (Signed)
Daily Progress Note Intern Pager: [phone]  Patient name: [name] Medical record number: [id number] Date of birth: 1951-11-11 Age: 72 y.o. Gender: male  Primary Care Provider: Erick Alley, DO Consultants: VVS Code Status: FULL  Pt Overview and Major Events to Date:  9/26: Admitted 10/2: AKA performed   Assessment and Plan:   [name] is a 72 year old gentleman admitted from outside podiatry office for gangrene of the right second and third digits, who will be receiving an above-the-knee amputation today per vascular surgery's recommendations.  Once surgery is complete we will follow-up with them for recommendations.  Mr. [name] has a previous history of type 2 diabetes, hypertension, hyperlipidemia and diabetes associated peripheral neuropathy which have all likely contributed to his current condition. Assessment & Plan Gangrene of toe of right foot (HCC) Pain today is well-controlled with increased dose of gabapentin.  We will reassess his pain needs following surgery today and vascular surgery's recommendations moving forward.  This patient will likely need social work involvement for discharge planning to ensure that his mobility at home is adequate for him to continue with his ADLs. 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  Chronic and Stable Problems:  Hypertension Hyperlipidemia Neuropathy Mood   FEN/GI: Carb modified diet PPx:  Heparin Dispo:Home pending PT/OT eval  Subjective:  Doing well postop. Having pain understandably. Has no concerns.  Objective: Temp:  [98 F (36.7 C)-98.6 F (37 C)] 98.5 F (36.9 C) (10/03 0311) Pulse Rate:  [69-82] 74 (10/02 1935) Resp:  [13-24] 14 (10/03 0311) BP: (80-145)/(49-105) 98/70 (10/03 0311) SpO2:  [91 %-100 %] 100 % (10/02 2352) Weight:  [82 kg-82.6 kg] 82 kg (10/02 1224) Physical Exam: General: Sitting up in bed, NAD Cardiovascular: RRR without m/r/g Respiratory: CTAB anteriorly Abdomen: Soft, nondistended, normoactive bowel sounds Extremities: L AKA with bandage overlying, no soaked drainage/bleeding  Laboratory: Most recent CBC Lab Results  Component Value Date   WBC 9.5 09/28/2023   HGB 7.2 (L) 09/28/2023   HCT 25.2 (L) 09/28/2023   MCV 73.7 (L) 09/28/2023   PLT 257 09/28/2023   Most recent BMP    Latest Ref Rng & Units 09/28/2023    4:03 AM  BMP  Glucose 70 - 99 mg/dL 629   BUN 8 - 23 mg/dL 6   Creatinine 5.28 - 4.13 mg/dL 2.44   Sodium 010 - 272 mmol/L 134   Potassium 3.5 - 5.1 mmol/L 4.3   Chloride 98 - 111 mmol/L 97   CO2 22 - 32 mmol/L 29   Calcium 8.9 - 10.3 mg/dL 8.6     Gerrit Heck, DO 09/28/2023, 7:37 AM  I was personally present and performed or re-performed the history, physical exam and medical decision making activities of this service and have verified that the service and findings are accurately documented in the student's note.  Janeal Holmes, MD                  09/28/2023, 9:44 AM

## 2023-09-28 NOTE — Assessment & Plan Note (Signed)
Patient's blood sugars remain acceptable, recommend restarting home regimen on discharge.

## 2023-09-28 NOTE — Evaluation (Signed)
Physical Therapy Evaluation Patient Details Name: [name] MRN: [id number] DOB: December 28, 1950 Today's Date: 09/28/2023  History of Present Illness  Patient is a 72 y/o male presenting with gangrene of R toes. Underwent LE angiogram on 9/30 though revascularization options limited. R AKA 10/2. PMH: PAD, tobacco use, prior L AKA, DM, diabetic neuropathy, anemia, HTN.   Clinical Impression  Pt admitted with above diagnosis. PTA pt lived at home with wife, mod I mobility/ADLs at wheelchair level. Pt currently with functional limitations due to the deficits listed below (see PT Problem List). Pt very limited on eval due to severe pain R residual limb with all mobility attempts. He is very motivated. With pain management, suspect he will progress well with mobility. Pt will benefit from acute skilled PT to increase their independence and safety with mobility to allow discharge. He would benefit from intensive inpatient rehab prior to d/c home.         If plan is discharge home, recommend the following: Two people to help with walking and/or transfers;Two people to help with bathing/dressing/bathroom   Can travel by private vehicle        Equipment Recommendations Other (comment) (sliding board vs hoyer lift)  Recommendations for Other Services  Rehab consult    Functional Status Assessment Patient has had a recent decline in their functional status and demonstrates the ability to make significant improvements in function in a reasonable and predictable amount of time.     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 pt able to assist using  bedrails to scoot up in flat bed.    Transfers                   General transfer comment: unable d/t pain today    Ambulation/Gait                  Stairs            Wheelchair Mobility     Tilt Bed    Modified Rankin (Stroke Patients Only)       Balance                                             Pertinent Vitals/Pain Pain Assessment Pain Assessment: Faces Faces Pain Scale: Hurts whole lot Pain Location: R LE Pain Descriptors / Indicators: Guarding, Grimacing, Moaning Pain Intervention(s): Monitored during session, Repositioned, RN gave pain meds during session    Home Living Family/patient expects to be discharged to:: Private residence Living Arrangements: Spouse/significant other Available Help at Discharge: Family;Available 24 hours/day Type of Home: House Home Access: Ramped entrance       Home Layout: One level;Able to live on main level with bedroom/bathroom Home Equipment: Tub bench;Electric scooter;Wheelchair - Forensic psychologist (2 wheels);BSC/3in1 Additional Comments: BSC over toilet    Prior Function Prior Level of Function : Independent/Modified Independent             Mobility Comments: using wheelchair in the home due to R leg giving out  when attempting walking with prosthetic. able to transfer self to/from electric wheelchair without issues ADLs Comments: mod I with showering with tub bench, dressing, and toileting. shares household IADLs with wife. likes to sit outside, has an accessible garden outside     [facility] Assessment   Upper Extremity Assessment Upper Extremity Assessment: Defer to OT evaluation    Lower Extremity Assessment Lower Extremity Assessment: LLE deficits/detail;RLE deficits/detail RLE Deficits / Details: s/p AKA RLE: Unable to fully assess due to pain LLE Deficits / Details: h/o AKA (01/2021)    Cervical / Trunk Assessment Cervical / Trunk Assessment: Normal   Communication   Communication Communication: No apparent difficulties  Cognition Arousal: Alert Behavior During Therapy: WFL for tasks assessed/performed Overall Cognitive Status: Within Functional Limits for tasks assessed                                          General Comments General comments (skin integrity, edema, etc.): Hbg 7.2, VSS on RA    Exercises     Assessment/Plan    PT Assessment Patient needs continued PT services  PT Problem List Decreased strength;Decreased balance;Pain;Decreased mobility;Decreased knowledge of use of DME;Decreased activity tolerance       PT Treatment Interventions DME instruction;Functional mobility training;Balance training;Patient/family education;Therapeutic activities;Therapeutic exercise    PT Goals (Current goals can be found in the Care Plan section)  Acute Rehab PT Goals Patient Stated Goal: rehab then home PT Goal Formulation: With patient Time For Goal Achievement: 10/12/23 Potential to Achieve Goals: Good    Frequency Min 1X/week     Co-evaluation               AM-PAC PT "6 Clicks" Mobility  Outcome Measure Help needed turning from your back to your side while in a flat bed without using bedrails?: A Lot Help needed moving from lying on your back to sitting on the side of a flat bed without using bedrails?: Total Help needed moving to and from a bed to a chair (including a wheelchair)?: Total Help needed standing up from a chair using your arms (e.g., wheelchair or bedside chair)?: Total Help needed to walk in hospital room?: Total Help needed climbing 3-5 steps with a railing? : Total 6 Click Score: 7    End of Session   Activity Tolerance: Patient limited by pain Patient left: in bed;with call bell/phone within reach Nurse Communication: Mobility status PT Visit Diagnosis: Other abnormalities of gait and mobility (R26.89);Pain Pain - Right/Left: Right Pain - part of body: Leg    Time:  0752-0808 PT Time Calculation (min) (ACUTE ONLY): 16 min   Charges:   PT Evaluation $PT Eval Moderate Complexity: 1 Mod   PT General Charges $$ ACUTE PT VISIT: 1 Visit         Ferd Glassing., PT  Office # [phone]   Ilda Foil 09/28/2023, 8:51 AM

## 2023-09-28 NOTE — Assessment & Plan Note (Signed)
H&H continues to trend downward we will reassess tomorrow morning following surgery and if necessary will intervene at that time

## 2023-09-28 NOTE — Progress Notes (Addendum)
  Progress Note    09/28/2023 6:52 AM 1 Day Post-Op  Subjective:  says he is sore but other pain is better.  Appreciative.    afebrile Vitals:   09/27/23 2352 09/28/23 0311  BP: (!) 122/105 98/70  Pulse:    Resp: 16 14  Temp: 98.2 F (36.8 C) 98.5 F (36.9 C)  SpO2: 100%     Physical Exam: Incisions:  bandage is clean without any bloody strike through.    CBC    Component Value Date/Time   WBC 9.5 09/28/2023 0403   RBC 3.42 (L) 09/28/2023 0403   HGB 7.2 (L) 09/28/2023 0403   HGB 9.9 (L) 02/23/2023 1210   HCT 25.2 (L) 09/28/2023 0403   HCT 34.6 (L) 02/23/2023 1210   PLT 257 09/28/2023 0403   PLT 362 02/23/2023 1210   MCV 73.7 (L) 09/28/2023 0403   MCV 67 (L) 02/23/2023 1210   MCH 21.1 (L) 09/28/2023 0403   MCHC 28.6 (L) 09/28/2023 0403   RDW 21.9 (H) 09/28/2023 0403   RDW 18.3 (H) 02/23/2023 1210   LYMPHSABS 1.5 09/21/2023 1329   LYMPHSABS 1.7 03/03/2022 1615   MONOABS 0.7 09/21/2023 1329   EOSABS 0.2 09/21/2023 1329   EOSABS 0.2 03/03/2022 1615   BASOSABS 0.0 09/21/2023 1329   BASOSABS 0.1 03/03/2022 1615    BMET    Component Value Date/Time   NA 134 (L) 09/28/2023 0403   NA 136 02/23/2023 1210   K 4.3 09/28/2023 0403   CL 97 (L) 09/28/2023 0403   CO2 29 09/28/2023 0403   GLUCOSE 148 (H) 09/28/2023 0403   BUN 6 (L) 09/28/2023 0403   BUN 8 02/23/2023 1210   CREATININE 0.64 09/28/2023 0403   CREATININE 0.94 03/31/2015 1454   CALCIUM 8.6 (L) 09/28/2023 0403   GFRNONAA >60 09/28/2023 0403   GFRAA 106 11/30/2020 0944    INR    Component Value Date/Time   INR 1.2 08/09/2023 0700     Intake/Output Summary (Last 24 hours) at 09/28/2023 1610 Last data filed at 09/28/2023 0400 Gross per 24 hour  Intake 1350 ml  Output 2225 ml  Net -875 ml     Assessment/Plan:  72 y.o. male is s/p right above knee amputation   1 Day Post-Op   -pt doing well this am.  His bandage is clean without bloody strike through.  Will take down bandage tomorrow.  He  will f/u in our office in 4-5 weeks for staple removal and our office will arrange appt (message has been sent to the office to arrange) -acute surgical blood loss anemia with hgb 7.2 down from 8.6.  transfuse per primary team   Doreatha Massed, PA-C [facility] [phone] 09/28/2023 6:52 AM    I have seen and evaluated the patient. I agree with the PA note as documented above.  Postop day 1 status post right AKA for critical limb ischemia with tissue loss even with patent bypass.  Pain seems reasonably well-controlled.  Dressing is dry.  Will take dressing down tomorrow.  Hemoglobin today 8.6 ---> 7.2.  Cephus Shelling, MD [facility] [place] Office: [phone]

## 2023-09-28 NOTE — Evaluation (Signed)
Occupational Therapy Evaluation Patient Details Name: [name] MRN: [id number] DOB: [birth year] Today's Date: 09/28/2023   History of Present Illness Patient is a 72 y/o male presenting with gangrene of R toes. Underwent LE angiogram on 9/30 though revascularization options limited. R AKA 10/2. PMH: PAD, tobacco use, prior L AKA, DM, diabetic neuropathy, anemia, HTN.   Clinical Impression   PTA, pt lives with wife and typically Modified Independent with transfers, ADLs and IADLs from a power wheelchair level. Pt presents now with primary limitation being pain so unable to advance EOB/OOB as hoped though good UB strength noted to assist with bed mobility. Due to pain and new amputation, pt requiring extensive assist for LB ADLs bed level. Recommend consideration of intensive rehab services based on high PLOF and rehab potential pending pain improvements. Will continue to follow acutely.       If plan is discharge home, recommend the following: A lot of help with walking and/or transfers;A lot of help with bathing/dressing/bathroom    Functional Status Assessment  Patient has had a recent decline in their functional status and demonstrates the ability to make significant improvements in function in a reasonable and predictable amount of time.  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 pt able to assist using bedrails to scoot up in flat bed.    Transfers                   General transfer comment: unable d/t  pain today      Balance                                           ADL either performed or assessed with clinical judgement   ADL Overall ADL's : Needs assistance/impaired Eating/Feeding: Independent   Grooming: Set up;Bed level   Upper Body Bathing: Minimal assistance;Bed level   Lower Body Bathing: Moderate assistance;Bed level   Upper Body Dressing : Minimal assistance;Bed level   Lower Body Dressing: Maximal assistance;Bed level       Toileting- Clothing Manipulation and Hygiene: Bed level;Maximal assistance         General ADL Comments: Limited primarily by pain today but shows insight into deficits, likely needs at DC and different DME to trial (sliding board)     Vision Ability to See in Adequate Light: 0 Adequate Patient Visual Report: No change from baseline Vision Assessment?: No apparent visual deficits     Perception         Praxis         Pertinent Vitals/Pain Pain Assessment Pain Assessment: Faces Faces Pain Scale: Hurts whole lot Pain Location: R LE Pain Descriptors / Indicators: Guarding, Grimacing, Moaning Pain Intervention(s): Monitored during session, Limited activity within patient's tolerance, Repositioned, RN gave pain meds during session     Extremity/Trunk Assessment Upper Extremity Assessment Upper Extremity Assessment: Overall WFL for tasks assessed;Right hand dominant   Lower Extremity  Assessment Lower Extremity Assessment: Defer to PT evaluation   Cervical / Trunk Assessment Cervical / Trunk Assessment: Normal   Communication Communication Communication: No apparent difficulties   Cognition Arousal: Alert Behavior During Therapy: WFL for tasks assessed/performed Overall Cognitive Status: Within Functional Limits for tasks assessed                                       General Comments       Exercises     Shoulder Instructions      Home Living Family/patient expects to be  discharged to:: Private residence Living Arrangements: Spouse/significant other Available Help at Discharge: Family;Available 24 hours/day Type of Home: House Home Access: Ramped entrance     Home Layout: One level;Able to live on main level with bedroom/bathroom     Bathroom Shower/Tub: Tub/shower unit;Curtain   Bathroom Toilet: Handicapped height     Home Equipment: Tub bench;Electric scooter;Wheelchair - Forensic psychologist (2 wheels);BSC/3in1   Additional Comments: BSC over toilet      Prior Functioning/Environment Prior Level of Function : Independent/Modified Independent             Mobility Comments: using wheelchair in the home due to R leg giving out when attempting walking with prosthetic. able to transfer self to/from electric wheelchair without issues ADLs Comments: mod I with showering with tub bench, dressing, and toileting. shares household IADLs with wife. likes to sit outside, has an accessible garden outside        OT Problem List: Impaired balance (sitting and/or standing);Decreased knowledge of use of DME or AE;Pain      OT Treatment/Interventions: Self-care/ADL training;Therapeutic exercise;Energy conservation;DME and/or AE instruction;Therapeutic activities;Patient/family education;Balance training    OT Goals(Current goals can be found in the care plan section) Acute Rehab OT Goals Patient Stated Goal: pain control, go to rehab before going home OT Goal Formulation: With patient Time For Goal Achievement: 10/12/23 Potential to Achieve Goals: Good  OT Frequency: Min 1X/week    Co-evaluation              AM-PAC OT "6 Clicks" Daily Activity     Outcome Measure Help from another person eating meals?: None Help from another person taking care of personal grooming?: A Little Help from another person toileting, which includes using toliet, bedpan, or urinal?: A Lot Help from another person bathing (including washing, rinsing, drying)?: A  Lot Help from another person to put on and taking off regular upper body clothing?: A Little Help from another person to put on and taking off regular lower body clothing?: A Lot 6 Click Score: 16   End of Session Nurse Communication: Mobility status;Patient requests pain meds  Activity Tolerance: Patient limited by pain Patient left: in bed;with call bell/phone within reach;with bed alarm set;Other (comment) (MD team entering during session)  OT Visit Diagnosis: Other abnormalities of gait and mobility (R26.89);Pain Pain - Right/Left: Right Pain - part of body: Leg                Time: 0752-0807 OT Time Calculation (min): 15 min Charges:  OT General Charges $OT Visit: 1 Visit OT Evaluation $OT Eval Moderate Complexity: 1 Mod  Bradd Canary, OTR/L Acute Rehab Services Office: [phone]   Lorre Munroe 09/28/2023, 8:27 AM

## 2023-09-29 DIAGNOSIS — Z89611 Acquired absence of right leg above knee: Secondary | ICD-10-CM | POA: Diagnosis not present

## 2023-09-29 DIAGNOSIS — I96 Gangrene, not elsewhere classified: Secondary | ICD-10-CM | POA: Diagnosis not present

## 2023-09-29 DIAGNOSIS — F17290 Nicotine dependence, other tobacco product, uncomplicated: Secondary | ICD-10-CM

## 2023-09-29 DIAGNOSIS — E1159 Type 2 diabetes mellitus with other circulatory complications: Secondary | ICD-10-CM

## 2023-09-29 DIAGNOSIS — Z7984 Long term (current) use of oral hypoglycemic drugs: Secondary | ICD-10-CM

## 2023-09-29 LAB — BASIC METABOLIC PANEL
Anion gap: 9 (ref 5–15)
BUN: 13 mg/dL (ref 8–23)
CO2: 29 mmol/L (ref 22–32)
Calcium: 8.9 mg/dL (ref 8.9–10.3)
Chloride: 96 mmol/L — ABNORMAL LOW (ref 98–111)
Creatinine, Ser: 0.65 mg/dL (ref 0.61–1.24)
GFR, Estimated: >60 mL/min (ref >60)
Glucose, Bld: 145 mg/dL — ABNORMAL HIGH (ref 70–99)
Potassium: 3.9 mmol/L (ref 3.5–5.1)
Sodium: 134 mmol/L — ABNORMAL LOW (ref 135–145)

## 2023-09-29 LAB — CBC
HCT: 26 % — ABNORMAL LOW (ref 39.0–52.0)
Hemoglobin: 7.6 g/dL — ABNORMAL LOW (ref 13.0–17.0)
MCH: 21.4 pg — ABNORMAL LOW (ref 26.0–34.0)
MCHC: 29.2 g/dL — ABNORMAL LOW (ref 30.0–36.0)
MCV: 73.2 fL — ABNORMAL LOW (ref 80.0–100.0)
Platelets: 304 10*3/uL (ref 150–400)
RBC: 3.55 MIL/uL — ABNORMAL LOW (ref 4.22–5.81)
RDW: 21.7 % — ABNORMAL HIGH (ref 11.5–15.5)
WBC: 8.7 10*3/uL (ref 4.0–10.5)
nRBC: 0 % (ref 0.0–0.2)

## 2023-09-29 MED ORDER — ALBUTEROL SULFATE (2.5 MG/3ML) 0.083% IN NEBU: 2.5000 mg | INHALATION_SOLUTION | Freq: Four times a day (QID) | RESPIRATORY_TRACT | Status: DC | PRN

## 2023-09-29 MED ORDER — ASPIRIN 81 MG PO TBEC
81.0000 mg | DELAYED_RELEASE_TABLET | Freq: Every day | ORAL | Status: DC
  Administered 2023-09-29 – 2023-10-08 (×10): 81 mg via ORAL
  Filled 2023-09-29 (×10): qty 1

## 2023-09-29 MED ORDER — ALBUTEROL SULFATE HFA 108 (90 BASE) MCG/ACT IN AERS: 2.0000 | INHALATION_SPRAY | Freq: Four times a day (QID) | RESPIRATORY_TRACT | Status: DC | PRN

## 2023-09-29 NOTE — Assessment & Plan Note (Signed)
Hgb stable today at 7.6 (7.2) will CTM.

## 2023-09-29 NOTE — Plan of Care (Signed)
  Problem: Activity: Goal: Ability to tolerate increased activity will improve Outcome: Not Progressing   

## 2023-09-29 NOTE — TOC Initial Note (Signed)
Transition of Care (TOC) - Initial/Assessment Note  Donn Pierini RN, BSN Transitions of Care Unit 4E- RN Case Manager See Treatment Team for direct phone #   Patient Details  Name: [name] MRN: [id number] Date of Birth: 1951-10-12  Transition of Care [facility]) CM/SW Contact:    Darrold Span, RN Phone Number: 09/29/2023, 2:49 PM  Clinical Narrative:                 Pt from home w/ wife, s/p right-AKA. CIR consulted and has started insurance auth for admit if approved. CIR liaison to follow.   Expected Discharge Plan: IP Rehab Facility Barriers to Discharge: Insurance Authorization   Patient Goals and CMS Choice Patient states their goals for this hospitalization and ongoing recovery are:: would like rehab prior to return home CMS Medicare.gov Compare Post Acute Care list provided to:: Patient Choice offered to / list presented to : Patient      Expected Discharge Plan and Services   Discharge Planning Services: CM Consult Post Acute Care Choice: IP Rehab Living arrangements for the past 2 months: Single Family Home                                      Prior Living Arrangements/Services Living arrangements for the past 2 months: Single Family Home Lives with:: Self, Spouse              Current home services: DME (rolling walker, wheelchair)    Activities of Daily Living   ADL Screening (condition at time of admission) Independently performs ADLs?: Yes (appropriate for developmental age) Does the patient have a NEW difficulty with bathing/dressing/toileting/self-feeding that is expected to last >3 days?: No Does the patient have a NEW difficulty with getting in/out of bed, walking, or climbing stairs that is expected to last >3 days?: No Does the patient have a NEW difficulty with communication that is expected to last >3 days?: No Is the patient deaf or have difficulty hearing?: No Does the patient have difficulty seeing, even when wearing  glasses/contacts?: No Does the patient have difficulty concentrating, remembering, or making decisions?: No  Permission Sought/Granted                  Emotional Assessment              Admission diagnosis:  Tobacco use disorder [F17.200] 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) [E11.51, I70.209] Iron deficiency anemia, unspecified iron deficiency anemia type [D50.9] Gangrene of right foot (HCC) [I96] Gangrene of toe of right foot (HCC) [I96] Patient Active Problem List   Diagnosis Date Noted   Gangrene of toe of right foot (HCC) 09/21/2023   Critical limb ischemia of right lower extremity (HCC) 08/09/2023   Bilateral shoulder pain 06/14/2023   Fatty liver 03/30/2021   Imaging of gastrointestinal tract abnormal 03/30/2021   History of colonic polyps 03/30/2021   S/P above knee amputation, left (HCC) 02/19/2021   Lung nodule < 6cm on CT 01/28/2019   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 [place] [address] Phone: [phone] Fax: [phone]     Social Determinants of Health (SDOH) Social History: SDOH Screenings   Food Insecurity: No Food Insecurity (09/21/2023)  Housing: Low Risk  (09/21/2023)  Transportation Needs: No Transportation Needs (09/21/2023)  Recent Concern: Transportation Needs - Unmet Transportation Needs (08/09/2023)  Utilities: Not At Risk  (09/21/2023)  Recent Concern: Utilities - At Risk (08/09/2023)  Alcohol Screen: Low Risk  (02/10/2023)  Depression (PHQ2-9): Low Risk  (06/14/2023)  Financial Resource Strain: Low Risk  (02/10/2023)  Physical Activity: Insufficiently Active (02/10/2023)  Social Connections: Moderately Isolated (02/10/2023)  Stress: No Stress Concern Present (02/10/2023)  Tobacco Use: High Risk (09/27/2023)   SDOH Interventions:     Readmission Risk Interventions    08/17/2023    9:33 AM 02/22/2021   12:24 PM  Readmission Risk Prevention Plan  Post Dischage Appt --   Medication Screening Complete   Transportation Screening Complete Complete  PCP or Specialist Appt within 5-7 Days  Complete  Home Care Screening  Complete  Medication Review (RN CM)  Complete

## 2023-09-29 NOTE — Progress Notes (Signed)
Physical Therapy Treatment Patient Details Name: [name] MRN: [id number] DOB: Jul 06, 1951 Today's Date: 09/29/2023   History of Present Illness Patient is a 72 y/o male presenting with gangrene of R toes. Underwent LE angiogram on 9/30 though revascularization options limited. R AKA 10/2. PMH: PAD, tobacco use, prior L AKA, DM, diabetic neuropathy, anemia, HTN.    PT Comments  Pt continues to be limited by pain. Unable to tolerate rolling or EOB attempt. Performed very gentle AROM exercises R residual limb. Bed placed in semi chair position. Pt is very motivated and eager to progress mobility after pain is managed.      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) Restrictions RLE Weight Bearing: Non weight bearing     Mobility  Bed Mobility Overal bed mobility: Needs Assistance             General bed mobility comments: unable to tolerate rolling due to pain    Transfers                        Ambulation/Gait                   Stairs             Wheelchair Mobility     Tilt Bed    Modified Rankin (Stroke Patients Only)       Balance                                            Cognition Arousal: Alert Behavior During Therapy: WFL for tasks assessed/performed Overall Cognitive Status: Within Functional Limits for tasks assessed                                          Exercises Amputee Exercises Gluteal Sets: AROM, Both, 10 reps, Supine Hip ABduction/ADduction: AROM, Right, 5 reps,  Supine Hip Flexion/Marching: AROM, Right, 5 reps, Supine    General Comments        Pertinent Vitals/Pain Pain Assessment Pain Assessment: 0-10 Pain Score: 8  Pain Location: R LE Pain Descriptors / Indicators: Guarding, Grimacing, Moaning, Tender Pain Intervention(s): Limited activity within patient's tolerance, Premedicated before session    Home Living                          Prior Function            PT Goals (current goals can now be found in the care plan section) Acute Rehab PT Goals Patient Stated Goal: rehab then home Progress towards PT goals: Not progressing toward goals - comment (pain)    Frequency    Min 1X/week      PT Plan      Co-evaluation              AM-PAC PT "6 Clicks" Mobility  Outcome Measure  Help needed turning from your back to your side while in a flat bed without using bedrails?: A Lot Help needed moving from lying on your back to sitting on the side of a flat bed without using bedrails?: Total Help needed moving to and from a bed to a chair (including a wheelchair)?: Total Help needed standing up from a chair using your arms (e.g., wheelchair or bedside chair)?: Total Help needed to walk in hospital room?: Total Help needed climbing 3-5 steps with a railing? : Total 6 Click Score: 7    End of Session   Activity Tolerance: Patient limited by pain Patient left: in bed;with call bell/phone within reach;with bed alarm set Nurse Communication: Mobility status PT Visit Diagnosis: Other abnormalities of gait and mobility (R26.89);Pain Pain - Right/Left: Right Pain - part of body: Leg     Time: 1129-1140 PT Time Calculation (min) (ACUTE ONLY): 11 min  Charges:    $Therapeutic Exercise: 8-22 mins PT General Charges $$ ACUTE PT VISIT: 1 Visit                     Ferd Glassing., PT  Office # [phone]    Ilda Foil 09/29/2023, 1:43 PM

## 2023-09-29 NOTE — Progress Notes (Signed)
Inpatient Rehab Coordinator Note:  I met with patient at bedside to discuss CIR recommendations and goals/expectations of CIR stay.  We reviewed 3 hrs/day of therapy, physician follow up, and average length of stay 2 weeks (dependent upon progress) with goals of mod I to supervision level with a w/c.  Pt familiar with our program from recent admission and in agreement to pursue insurance auth which I will start today.   Estill Dooms, PT, DPT Admissions Coordinator [phone] 09/29/23  12:34 PM

## 2023-09-29 NOTE — Assessment & Plan Note (Signed)
>>  ASSESSMENT AND PLAN FOR ANEMIA WRITTEN ON 09/29/2023  8:43 AM BY Bess Kinds, MD  Hgb stable today at 7.6 (7.2) will CTM.

## 2023-09-29 NOTE — Progress Notes (Addendum)
  Progress Note    09/29/2023 8:07 AM 2 Days Post-Op  Subjective:  feels fine. Right leg is sore    Vitals:   09/29/23 0016 09/29/23 0403  BP: 119/81 94/67  Pulse: 85 80  Resp: 18 15  Temp: 97.7 F (36.5 C) 98.1 F (36.7 C)  SpO2: 100% 100%    Physical Exam: General:  sitting up in bed comfortably Cardiac:  regular Lungs:  nonlabored Incisions:  right AKA dressing taken down. Staples intact and dry. No hematoma   CBC    Component Value Date/Time   WBC 8.7 09/29/2023 0433   RBC 3.55 (L) 09/29/2023 0433   HGB 7.6 (L) 09/29/2023 0433   HGB 9.9 (L) 02/23/2023 1210   HCT 26.0 (L) 09/29/2023 0433   HCT 34.6 (L) 02/23/2023 1210   PLT 304 09/29/2023 0433   PLT 362 02/23/2023 1210   MCV 73.2 (L) 09/29/2023 0433   MCV 67 (L) 02/23/2023 1210   MCH 21.4 (L) 09/29/2023 0433   MCHC 29.2 (L) 09/29/2023 0433   RDW 21.7 (H) 09/29/2023 0433   RDW 18.3 (H) 02/23/2023 1210   LYMPHSABS 1.5 09/21/2023 1329   LYMPHSABS 1.7 03/03/2022 1615   MONOABS 0.7 09/21/2023 1329   EOSABS 0.2 09/21/2023 1329   EOSABS 0.2 03/03/2022 1615   BASOSABS 0.0 09/21/2023 1329   BASOSABS 0.1 03/03/2022 1615    BMET    Component Value Date/Time   NA 134 (L) 09/29/2023 0433   NA 136 02/23/2023 1210   K 3.9 09/29/2023 0433   CL 96 (L) 09/29/2023 0433   CO2 29 09/29/2023 0433   GLUCOSE 145 (H) 09/29/2023 0433   BUN 13 09/29/2023 0433   BUN 8 02/23/2023 1210   CREATININE 0.65 09/29/2023 0433   CREATININE 0.94 03/31/2015 1454   CALCIUM 8.9 09/29/2023 0433   GFRNONAA >60 09/29/2023 0433   GFRAA 106 11/30/2020 0944    INR    Component Value Date/Time   INR 1.2 08/09/2023 0700     Intake/Output Summary (Last 24 hours) at 09/29/2023 0807 Last data filed at 09/29/2023 0056 Gross per 24 hour  Intake 886.14 ml  Output 600 ml  Net 286.14 ml      Assessment/Plan:  72 y.o. male is 2 days post op, s/p: right AKA   -Doing well this morning. Has some soreness in the right AKA site but this  is controlled -Dressing taken down. Right AKA incision is dry and intact with staples. If it has any further bloody drainage a new dry dressing can be placed -Hgb improved at 7.6   [name], PA-C [facility] [phone] 09/29/2023 8:07 AM  I have seen and evaluated the patient. I agree with the PA note as documented above.  Status post right AKA.  Dressing removed today and stump looks great.  Will arrange follow-up in 4 weeks for staple removal.  Cephus Shelling, MD [facility]: [phone]

## 2023-09-29 NOTE — Consult Note (Signed)
Physical Medicine and Rehabilitation Consult Reason for Consult: Functional deficits after right above-knee amputation Referring Physician: Linwood Dibbles   HPI: [name] is a 72 y.o. male with a history of diabetes and PAD with prior left above-knee amputation who presented on 09/21/2023 with the 2-week history of increased pain and changes in color of the toes in his right foot.  He presented with obvious gangrene at the time of admission.  Patient was seen by podiatry and vascular surgery.  On 09/25/2023 patient underwent angiogram which demonstrated minimal flow through the distal portions of his previous bypass.  It was felt that there is little chance for salvage of the limb and on September 27, 2023 the patient underwent a right above-knee amputation by Dr. Chestine Spore.  Patient mobilized with therapy briefly yesterday and patient was unable to transfer off of the bed due to significant pain.  Hemoglobin 7.6 today.  Patient lives at home with his spouse in a 1 level home with ramped entrance.  Prior to admission patient was using a wheelchair because he was having difficulties with right leg giving out on him while he attempted to ambulate with his left sided prosthesis.  Review of Systems  Constitutional:  Negative for fever.  HENT: Negative.    Eyes: Negative.   Respiratory: Negative.    Cardiovascular: Negative.   Gastrointestinal: Negative.   Genitourinary: Negative.   Musculoskeletal:  Positive for joint pain and myalgias.  Skin:  Negative for rash.  Neurological:  Positive for sensory change.  Psychiatric/Behavioral:  Negative for suicidal ideas.    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 Venetia Maxon neurosurgery   Chronic lower back pain    Claudication of both lower extremities (HCC)  07/15/2015   Critical lower limb ischemia (HCC) 12/19/2019   Diabetes mellitus    takes Metformin daily   GERD (gastroesophageal reflux disease)    takes Protonix daily   History of blood transfusion    "related to low HgB" ((09/10/2015   Hyperlipidemia    takes Vytorin daily   Hypertension    takes Benazepril and Bystolic daily   PAD (peripheral artery disease) (HCC)    Pneumonia    Radiculopathy, cervical region 02/11/2017   Shortness of breath dyspnea    with exertion   Tobacco user    Toe fracture, right 05/09/2011   Wears glasses    Past Surgical History:  Procedure Laterality Date   ABDOMINAL AORTOGRAM W/LOWER EXTREMITY N/A 12/11/2017   Procedure: ABDOMINAL AORTOGRAM W/LOWER EXTREMITY;  Surgeon: Chuck Hint, MD;  Location: [facility] INVASIVE CV LAB;  Service: Cardiovascular;  Laterality: N/A;   ABDOMINAL AORTOGRAM W/LOWER EXTREMITY Bilateral 04/22/2020   Procedure: ABDOMINAL AORTOGRAM W/LOWER EXTREMITY;  Surgeon: Cephus Shelling, MD;  Location: [facility] INVASIVE CV LAB;  Service: Cardiovascular;  Laterality: Bilateral;   ABDOMINAL AORTOGRAM W/LOWER EXTREMITY N/A 07/27/2023   Procedure: ABDOMINAL AORTOGRAM W/LOWER EXTREMITY;  Surgeon: Cephus Shelling, MD;  Location: [facility] INVASIVE CV LAB;  Service: Cardiovascular;  Laterality: N/A;   ABDOMINAL AORTOGRAM W/LOWER EXTREMITY Right 09/25/2023   Procedure: ABDOMINAL AORTOGRAM W/LOWER EXTREMITY;  Surgeon: Maeola Harman, MD;  Location: [facility] INVASIVE CV LAB;  Service: Cardiovascular;  Laterality: Right;   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   AMPUTATION Left 02/19/2021  Procedure: LEFT ABOVE KNEE AMPUTATION;  Surgeon: Leonie Douglas, MD;  Location: [facility] OR;  Service: Vascular;  Laterality: Left;   AMPUTATION Right 09/27/2023   Procedure: RIGHT AMPUTATION ABOVE KNEE;  Surgeon: Cephus Shelling, MD;  Location: [facility] OR;  Service: Vascular;  Laterality: Right;   ANTERIOR CERVICAL DECOMP/DISCECTOMY FUSION  03/08/12   C6-7    ANTERIOR CERVICAL DECOMP/DISCECTOMY FUSION  03/08/2012   Procedure: ANTERIOR CERVICAL DECOMPRESSION/DISCECTOMY FUSION 1 LEVEL/HARDWARE REMOVAL;  Surgeon: Maeola Harman, MD;  Location: [facility] NEURO ORS;  Service: Neurosurgery;  Laterality: N/A;  revison of C5-7 anterior cervical decompression with fusion with Cervical Five-Thoracic One anterior cervical decompression with fusion with interbody prothesis plating and bonegraft   AORTIC ARCH ANGIOGRAPHY N/A 09/25/2023   Procedure: AORTIC ARCH ANGIOGRAPHY;  Surgeon: Maeola Harman, MD;  Location: [facility] INVASIVE CV LAB;  Service: Cardiovascular;  Laterality: N/A;   BACK SURGERY  1996   BIOPSY  12/05/2020   Procedure: BIOPSY;  Surgeon: Meryl Dare, MD;  Location: [facility] ENDOSCOPY;  Service: Endoscopy;;   BYPASS GRAFT FEMORAL-PERONEAL Left 11/11/2016   Procedure: REDO LEFT FEMORAL-PERONEAL BYPASS WITH PROPATEN X 80CM GRAFT;  Surgeon: Chuck Hint, MD;  Location: [facility] OR;  Service: Vascular;  Laterality: Left;   BYPASS GRAFT FEMORAL-PERONEAL Right 08/09/2023   Procedure: RIGHT  FEMORAL  ARTERY TO PERONEAL ARTERY BYPASS GRAFT USING GREATER SAPHENOUS VEIN;  Surgeon: Cephus Shelling, MD;  Location: [facility] OR;  Service: Vascular;  Laterality: Right;   COLONOSCOPY W/ BIOPSIES AND POLYPECTOMY  08/17/2012   f/u 5 years, 4 polyps, no high grade dysplasia or malignancy, tubular adenoma, hyperplastic polyops   COLONOSCOPY WITH PROPOFOL N/A 12/05/2020   Procedure: COLONOSCOPY WITH PROPOFOL;  Surgeon: Meryl Dare, MD;  Location: [facility] ENDOSCOPY;  Service: Endoscopy;  Laterality: N/A;   EMBOLECTOMY Left 12/19/2019   Procedure: LEFT FEMERAL- PERONEAL THROMBECTOMY;  Surgeon: Cephus Shelling, MD;  Location: [facility] OR;  Service: Vascular;  Laterality: Left;   ENDARTERECTOMY FEMORAL Right 04/24/2020   Procedure: RIGHT FEMORAL ENDARTERECTOMY;  Surgeon: Cephus Shelling, MD;  Location: [facility] OR;  Service: Vascular;  Laterality: Right;   ENTEROSCOPY N/A  12/11/2015   Procedure: ENTEROSCOPY;  Surgeon: Jeani Hawking, MD;  Location: [facility] ENDOSCOPY;  Service: Endoscopy;  Laterality: N/A;   ENTEROSCOPY N/A 03/29/2018   Procedure: ENTEROSCOPY;  Surgeon: Jeani Hawking, MD;  Location: [facility] ENDOSCOPY;  Service: Endoscopy;  Laterality: N/A;   ESOPHAGOGASTRODUODENOSCOPY  08/17/2012   normal esophagus and GEJ, diffuse gastritis with erythema- no malignancy, reactive gastropathy  with focal intestinal metaplasia   ESOPHAGOGASTRODUODENOSCOPY (EGD) WITH PROPOFOL N/A 12/05/2020   Procedure: ESOPHAGOGASTRODUODENOSCOPY (EGD) WITH PROPOFOL;  Surgeon: Meryl Dare, MD;  Location: [facility] ENDOSCOPY;  Service: Endoscopy;  Laterality: N/A;   FEMORAL-POPLITEAL BYPASS GRAFT Left 01/06/2016   Procedure: Left  COMMON FEMORAL-BELOW KNEE POPLITEAL ARTERY Bypass using non-reversed translocated saphenous vein graft from left leg;  Surgeon: Pryor Ochoa, MD;  Location: [facility] OR;  Service: Vascular;  Laterality: Left;   GIVENS CAPSULE STUDY N/A 11/24/2015   Procedure: GIVENS CAPSULE STUDY;  Surgeon: Charna Elizabeth, MD;  Location: [facility] ENDOSCOPY;  Service: Endoscopy;  Laterality: N/A;   HOT HEMOSTASIS N/A 03/29/2018   Procedure: HOT HEMOSTASIS (ARGON PLASMA COAGULATION/BICAP);  Surgeon: Jeani Hawking, MD;  Location: [facility] ENDOSCOPY;  Service: Endoscopy;  Laterality: N/A;   INGUINAL HERNIA REPAIR  1990's   right   INTRAOPERATIVE ARTERIOGRAM Left 01/06/2016   Procedure: INTRA OPERATIVE ARTERIOGRAM LEFT LOWER LEG;  Surgeon: Pryor Ochoa, MD;  Location: [facility] OR;  Service: Vascular;  Laterality: Left;   INTRAOPERATIVE ARTERIOGRAM Left 11/11/2016   Procedure: INTRA OPERATIVE ARTERIOGRAM LEFT LOWER EXTRIMITY;  Surgeon: Chuck Hint, MD;  Location: [facility] OR;  Service: Vascular;  Laterality: Left;   IR ANGIOGRAM FOLLOW UP STUDY  12/11/2017   IR GENERIC HISTORICAL  10/24/2016   IR ANGIOGRAM FOLLOW UP STUDY   LOWER EXTREMITY ANGIOGRAM Bilateral 07/30/2015   Procedure: Lower Extremity Angiogram;  Surgeon:  Fransisco Hertz, MD;  Location: [facility] INVASIVE CV LAB;  Service: Cardiovascular;  Laterality: Bilateral;   LUMBAR DISC SURGERY  1996   "lower"   PATCH ANGIOPLASTY Left 12/19/2019   Procedure: LEFT FEMORAL -PERONEAL PATCH ANGIOPLASTY WITH XENOSURE BIOLOGIC PATCH  ;  Surgeon: Cephus Shelling, MD;  Location: [facility] OR;  Service: Vascular;  Laterality: Left;   PATCH ANGIOPLASTY Right 04/24/2020   Procedure: Patch Angioplasty of Right Femoral Artery using Long Xenosure Biologic Patch 1cm x 14 cm;  Surgeon: Cephus Shelling, MD;  Location: [facility] OR;  Service: Vascular;  Laterality: Right;   PERIPHERAL VASCULAR CATHETERIZATION N/A 07/30/2015   Procedure: Abdominal Aortogram;  Surgeon: Fransisco Hertz, MD;  Location: [facility] INVASIVE CV LAB;  Service: Cardiovascular;  Laterality: N/A;   PERIPHERAL VASCULAR CATHETERIZATION N/A 10/24/2016   Procedure: Abdominal Aortogram;  Surgeon: Chuck Hint, MD;  Location: [facility] INVASIVE CV LAB;  Service: Cardiovascular;  Laterality: N/A;   PERIPHERAL VASCULAR CATHETERIZATION N/A 10/24/2016   Procedure: Lower Extremity Angiography;  Surgeon: Chuck Hint, MD;  Location: [facility] INVASIVE CV LAB;  Service: Cardiovascular;  Laterality: N/A;   PERIPHERAL VASCULAR INTERVENTION Right 12/11/2017   Procedure: PERIPHERAL VASCULAR INTERVENTION;  Surgeon: Chuck Hint, MD;  Location: [facility] INVASIVE CV LAB;  Service: Cardiovascular;  Laterality: Right;   PERIPHERAL VASCULAR INTERVENTION Right 07/27/2023   Procedure: PERIPHERAL VASCULAR INTERVENTION;  Surgeon: Cephus Shelling, MD;  Location: [facility] INVASIVE CV LAB;  Service: Cardiovascular;  Laterality: Right;  Common Illiac   UPPER EXTREMITY ANGIOGRAPHY  09/25/2023   Procedure: Upper Extremity Angiography;  Surgeon: Maeola Harman, MD;  Location: [facility] INVASIVE CV LAB;  Service: Cardiovascular;;   VASCULAR SURGERY  ~ 2007   Stent SFA    VEIN HARVEST Left 01/06/2016   Procedure: LEFT GREATER SAPPHENOUS VEIN HARVEST;   Surgeon: Pryor Ochoa, MD;  Location: [facility] OR;  Service: Vascular;  Laterality: Left;   VEIN HARVEST Right 08/09/2023   Procedure: RIGHT GREATER SAPHENOUS VEIN HARVEST;  Surgeon: Cephus Shelling, MD;  Location: [facility] OR;  Service: Vascular;  Laterality: Right;   Family History  Problem Relation Age of Onset   Cancer Mother        colon Cancer   Hyperlipidemia Mother    Diabetes Mother    Heart disease Father    Hypertension Father    Cancer Sister        Uterine   Diabetes Sister    Diabetes Brother    Social History:  reports that he has been smoking cigars. He has been exposed to tobacco smoke. He has never used smokeless tobacco. He reports current alcohol use of about 2.0 standard drinks of alcohol per week. He reports that he does not use drugs. Allergies:  Allergies  Allergen Reactions   Glipizide Other (See Comments)    REACTION IS SIDE EFFECT Severe hypoglycemia to 40s.    Medications Prior to Admission  Medication Sig Dispense Refill   acetaminophen (TYLENOL) 325 MG tablet Take 1-2 tablets (325-650 mg total) by  mouth every 4 (four) hours as needed for mild pain (or temp >/= 101 F).     albuterol (VENTOLIN HFA) 108 (90 Base) MCG/ACT inhaler TAKE 2 PUFFS BY MOUTH EVERY 6 HOURS AS NEEDED FOR WHEEZE OR SHORTNESS OF BREATH 8.5 each 1   amLODipine (NORVASC) 5 MG tablet Take 5 mg by mouth at bedtime.     ascorbic acid (VITAMIN C) 500 MG tablet Take 1 tablet (500 mg total) by mouth 2 (two) times daily. 100 tablet 0   aspirin EC 81 MG tablet Take 1 tablet (81 mg total) by mouth daily. Swallow whole.     atorvastatin (LIPITOR) 40 MG tablet Take 1 tablet (40 mg total) by mouth daily. 30 tablet 0   Baclofen 5 MG TABS Take 1 tablet (5 mg total) by mouth 3 (three) times daily. (Patient taking differently: Take 5 mg by mouth 3 (three) times daily as needed (muscle spasms).) 90 each 0   clopidogrel (PLAVIX) 75 MG tablet Take 1 tablet (75 mg total) by mouth daily. 30 tablet 0   docusate  sodium (COLACE) 100 MG capsule Take 1 capsule (100 mg total) by mouth daily. (Patient taking differently: Take 100 mg by mouth daily as needed for mild constipation.)     ezetimibe (ZETIA) 10 MG tablet Take 1 tablet (10 mg total) by mouth daily. 30 tablet 0   FLUoxetine (PROZAC) 10 MG capsule Take 1 capsule (10 mg total) by mouth daily. 30 capsule 0   gabapentin (NEURONTIN) 300 MG capsule Take 2 capsules (600 mg total) by mouth 2 (two) times daily. 120 capsule 0   melatonin 3 MG TABS tablet Take 2 tablets (6 mg total) by mouth at bedtime. 60 tablet 0   metFORMIN (GLUCOPHAGE) 1000 MG tablet Take 1 tablet (1,000 mg total) by mouth 2 (two) times daily with a meal. 60 tablet 0   nebivolol (BYSTOLIC) 10 MG tablet Take 1 tablet (10 mg total) by mouth daily. 30 tablet 0   nicotine polacrilex (NICORETTE) 2 MG gum Take 1 each (2 mg total) by mouth as needed for smoking cessation. 100 tablet 3   oxyCODONE-acetaminophen (PERCOCET/ROXICET) 5-325 MG tablet Take 1 tablet by mouth every 4 (four) hours as needed.     pantoprazole (PROTONIX) 40 MG tablet Take 1 tablet (40 mg total) by mouth daily. 30 tablet 0   Zinc Sulfate 220 (50 Zn) MG TABS Take 1 tablet (220 mg total) by mouth daily. 14 tablet 0    Home: Home Living Family/patient expects to be discharged to:: Private residence Living Arrangements: Spouse/significant other Available Help at Discharge: Family, Available 24 hours/day Type of Home: House Home Access: Ramped entrance Home Layout: One level, Able to live on main level with bedroom/bathroom Bathroom Shower/Tub: Tub/shower unit, Engineer, building services: Handicapped height Home Equipment: Tub bench, Art gallery manager, Wheelchair - manual, Agricultural consultant (2 wheels), BSC/3in1 Additional Comments: BSC over toilet  Functional History: Prior Function Prior Level of Function : Independent/Modified Independent Mobility Comments: using wheelchair in the home due to R leg giving out when attempting  walking with prosthetic. able to transfer self to/from electric wheelchair without issues ADLs Comments: mod I with showering with tub bench, dressing, and toileting. shares household IADLs with wife. likes to sit outside, has an accessible garden outside Functional Status:  Mobility: Bed Mobility Overal bed mobility: Needs Assistance General bed mobility comments: attempted EOB R vs L though pt with too much pain. pt able to assist using bedrails to scoot up in flat  bed. Transfers General transfer comment: unable d/t pain today      ADL: ADL Overall ADL's : Needs assistance/impaired Eating/Feeding: Independent Grooming: Set up, Bed level Upper Body Bathing: Minimal assistance, Bed level Lower Body Bathing: Moderate assistance, Bed level Upper Body Dressing : Minimal assistance, Bed level Lower Body Dressing: Maximal assistance, Bed level Toileting- Clothing Manipulation and Hygiene: Bed level, Maximal assistance General ADL Comments: Limited primarily by pain today but shows insight into deficits, likely needs at DC and different DME to trial (sliding board)  Cognition: Cognition Overall Cognitive Status: Within Functional Limits for tasks assessed Orientation Level: Oriented X4 Cognition Arousal: Alert Behavior During Therapy: [facility] for tasks assessed/performed Overall Cognitive Status: Within Functional Limits for tasks assessed  Blood pressure 99/73, pulse 88, temperature 98.7 F (37.1 C), temperature source Oral, resp. rate 20, height 5\' 7"  (1.702 m), weight 82 kg, SpO2 100%. Physical Exam Constitutional:      Appearance: He is obese.  HENT:     Head: Normocephalic.     Nose: Nose normal.     Mouth/Throat:     Mouth: Mucous membranes are moist.  Eyes:     Conjunctiva/sclera: Conjunctivae normal.  Cardiovascular:     Rate and Rhythm: Normal rate.  Pulmonary:     Effort: Pulmonary effort is normal.  Abdominal:     Palpations: Abdomen is soft.  Musculoskeletal:      Cervical back: Normal range of motion.     Comments: Right stump swollen, tender. Left stump well shaped.   Skin:    Comments: Right AKA incision CDI with staples  Neurological:     Mental Status: He is alert.     Comments: Alert and oriented x 3. Normal insight and awareness. Intact Memory. Normal language and speech. Cranial nerve exam unremarkable. MMT: UE 5/5 except for HI which are 4/5. Decreased LT in fingers/hands. Normal muscle tone. Marland Kitchen    Psychiatric:        Mood and Affect: Mood normal.        Behavior: Behavior normal.     Results for orders placed or performed during the hospital encounter of 09/21/23 (from the past 24 hour(s))  CBC     Status: Abnormal   Collection Time: 09/29/23  4:33 AM  Result Value Ref Range   WBC 8.7 4.0 - 10.5 K/uL   RBC 3.55 (L) 4.22 - 5.81 MIL/uL   Hemoglobin 7.6 (L) 13.0 - 17.0 g/dL   HCT 40.9 (L) 81.1 - 91.4 %   MCV 73.2 (L) 80.0 - 100.0 fL   MCH 21.4 (L) 26.0 - 34.0 pg   MCHC 29.2 (L) 30.0 - 36.0 g/dL   RDW 78.2 (H) 95.6 - 21.3 %   Platelets 304 150 - 400 K/uL   nRBC 0.0 0.0 - 0.2 %  Basic metabolic panel     Status: Abnormal   Collection Time: 09/29/23  4:33 AM  Result Value Ref Range   Sodium 134 (L) 135 - 145 mmol/L   Potassium 3.9 3.5 - 5.1 mmol/L   Chloride 96 (L) 98 - 111 mmol/L   CO2 29 22 - 32 mmol/L   Glucose, Bld 145 (H) 70 - 99 mg/dL   BUN 13 8 - 23 mg/dL   Creatinine, Ser 0.86 0.61 - 1.24 mg/dL   Calcium 8.9 8.9 - 57.8 mg/dL   GFR, Estimated >46 >96 mL/min   Anion gap 9 5 - 15   *Note: Due to a large number of results and/or encounters for  the requested time period, some results have not been displayed. A complete set of results can be found in Results Review.   No results found.  Assessment/Plan: Diagnosis: 72 year old male with severe PAD status post right above-knee amputation on 09/27/2023.  Patient with a history of prior left above-knee amputation. Does the need for close, 24 hr/day medical supervision in concert  with the patient's rehab needs make it unreasonable for this patient to be served in a less intensive setting? Yes Co-Morbidities requiring supervision/potential complications:  -pain mgt -acute blood loss anemia -diabetes Due to bladder management, bowel management, safety, skin/wound care, disease management, medication administration, pain management, and patient education, does the patient require 24 hr/day rehab nursing? Yes Does the patient require coordinated care of a physician, rehab nurse, therapy disciplines of PT, OT to address physical and functional deficits in the context of the above medical diagnosis(es)? Yes Addressing deficits in the following areas: balance, endurance, locomotion, strength, transferring, bowel/bladder control, bathing, dressing, feeding, grooming, toileting, and psychosocial support Can the patient actively participate in an intensive therapy program of at least 3 hrs of therapy per day at least 5 days per week? Yes The potential for patient to make measurable gains while on inpatient rehab is excellent Anticipated functional outcomes upon discharge from inpatient rehab are modified independent and supervision  with PT, modified independent and supervision with OT, modified independent and supervision with SLP. Estimated rehab length of stay to reach the above functional goals is: 10-13 days Anticipated discharge destination: Home Overall Rehab/Functional Prognosis: excellent  POST ACUTE RECOMMENDATIONS: This patient's condition is appropriate for continued rehabilitative care in the following setting: CIR Patient has agreed to participate in recommended program. Yes Note that insurance prior authorization may be required for reimbursement for recommended care.  Comment: Pt lives at home with his wife who can provide only limited physical assistance. He never really has been able to get up on his left AK prosthesis due to complications with the RLE. Goals  would be at w/c level   MEDICAL RECOMMENDATIONS: Stump shrinker for RLE for edema control and protection of incision.    I have personally performed a face to face diagnostic evaluation of this patient. Additionally, I have examined the patient's medical record including any pertinent labs and radiographic images. If the physician assistant has documented in this note, I have reviewed and edited or otherwise concur with the physician assistant's documentation.  Thanks,  Ranelle Oyster, MD 09/29/2023

## 2023-09-29 NOTE — Assessment & Plan Note (Signed)
Patient's blood sugars remain acceptable, recommend restarting home regimen on discharge.

## 2023-09-29 NOTE — Progress Notes (Signed)
Pt's BP 80's/60's map is in the 70's, pt asymptomatic, md on call paged no new orders given

## 2023-09-29 NOTE — Progress Notes (Signed)
Daily Progress Note Intern Pager: [phone]  Patient name: [name] Medical record number: [id number] Date of birth: Nov 05, 1951 Age: 72 y.o. Gender: male  Primary Care Provider: Erick Alley, DO Consultants: VVS Code Status: Full  Pt Overview and Major Events to Date:  9/26: Admitted 10/2: AKA performed  Assessment and Plan:  [name] is a 72 year old gentleman w/ PMH of DM, HTN, HLD, Peripheral Neuropathy, admitted for gangrene of the right second and third digits, who received above-the-knee amputation   Assessment & Plan Gangrene of toe of right foot (HCC) Pain today is well-controlled, post op day 2. Working with PT, who is recommending CIR. Patient will likely need social work involvement for discharge planning to ensure that his mobility at home is adequate for him to continue with his ADLs. - Tylenol 650 mg q6h scheduled  - Oxycodone 7.5 q4 PRN for pain,  - Gabapentin 600mg  3 times daily -Heparin restarted per vascular surgery's recommendations, they will stop at rollback - on ASA, atorvastatin for PAD - AM CBC Anemia Hgb stable today at 7.6 (7.2) will CTM. 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   Chronic and Stable Issues: Hypertension Hyperlipidemia Neuropathy Mood  FEN/GI: Carb Modified PPx: Heparin Dispo:Home vs CIR    Subjective:  Dong well this morning, no complaints. Pain well controlled.   Objective: Temp:  [97.7 F (36.5 C)-99.3 F (37.4 C)] 98.7 F (37.1 C) (10/04 0817) Pulse Rate:  [80-90] 90 (10/04 0817) Resp:  [15-20] 15 (10/04 0815) BP: (59-154)/(41-81) 89/74 (10/04 0817) SpO2:  [99 %-100 %] 100 % (10/04 0815) Physical Exam: General: NAD, Elderly, male, good mood Cardiovascular: RRR, NRMG Respiratory: CTABL Abdomen: Soft, NTTP Extremities: Bilateral AKA, right AKA C/D/I  Laboratory: Most  recent CBC Lab Results  Component Value Date   WBC 8.7 09/29/2023   HGB 7.6 (L) 09/29/2023   HCT 26.0 (L) 09/29/2023   MCV 73.2 (L) 09/29/2023   PLT 304 09/29/2023   Most recent BMP    Latest Ref Rng & Units 09/29/2023    4:33 AM  BMP  Glucose 70 - 99 mg/dL 132   BUN 8 - 23 mg/dL 13   Creatinine 4.40 - 1.24 mg/dL 1.02   Sodium 725 - 366 mmol/L 134   Potassium 3.5 - 5.1 mmol/L 3.9   Chloride 98 - 111 mmol/L 96   CO2 22 - 32 mmol/L 29   Calcium 8.9 - 10.3 mg/dL 8.9       Bess Kinds, MD 09/29/2023, 8:37 AM  PGY-3, [place] [facility] FPTS Intern pager: [phone], text pages welcome Secure chat group [facility] Teaching Service

## 2023-09-29 NOTE — Assessment & Plan Note (Addendum)
Pain today is well-controlled, post op day 2. Working with PT, who is recommending CIR. Patient will likely need social work involvement for discharge planning to ensure that his mobility at home is adequate for him to continue with his ADLs. - Tylenol 650 mg q6h scheduled  - Oxycodone 7.5 q4 PRN for pain,  - Gabapentin 600mg  3 times daily -Heparin restarted per vascular surgery's recommendations, they will stop at rollback - on ASA, atorvastatin for PAD - AM CBC

## 2023-09-30 DIAGNOSIS — Z89611 Acquired absence of right leg above knee: Secondary | ICD-10-CM | POA: Diagnosis not present

## 2023-09-30 LAB — CBC
HCT: 26.5 % — ABNORMAL LOW (ref 39.0–52.0)
Hemoglobin: 7.6 g/dL — ABNORMAL LOW (ref 13.0–17.0)
MCH: 20.6 pg — ABNORMAL LOW (ref 26.0–34.0)
MCHC: 28.7 g/dL — ABNORMAL LOW (ref 30.0–36.0)
MCV: 71.8 fL — ABNORMAL LOW (ref 80.0–100.0)
Platelets: 336 10*3/uL (ref 150–400)
RBC: 3.69 MIL/uL — ABNORMAL LOW (ref 4.22–5.81)
RDW: 21.5 % — ABNORMAL HIGH (ref 11.5–15.5)
WBC: 9.4 10*3/uL (ref 4.0–10.5)
nRBC: 0 % (ref 0.0–0.2)

## 2023-09-30 MED ORDER — FERROUS SULFATE 325 (65 FE) MG PO TABS: 325.0000 mg | ORAL_TABLET | Freq: Every day | ORAL | Status: DC

## 2023-09-30 MED ORDER — SODIUM CHLORIDE 0.9 % IV SOLN
300.0000 mg | Freq: Once | INTRAVENOUS | Status: AC
  Administered 2023-09-30: 300 mg via INTRAVENOUS
  Filled 2023-09-30: qty 15

## 2023-09-30 MED ORDER — ALBUTEROL SULFATE (2.5 MG/3ML) 0.083% IN NEBU: 2.5000 mg | INHALATION_SOLUTION | Freq: Four times a day (QID) | RESPIRATORY_TRACT | Status: DC | PRN

## 2023-09-30 MED ORDER — SODIUM CHLORIDE 0.9 % IV BOLUS
250.0000 mL | Freq: Once | INTRAVENOUS | Status: AC
  Administered 2023-09-30: 250 mL via INTRAVENOUS

## 2023-09-30 MED ORDER — METFORMIN HCL 500 MG PO TABS
1000.0000 mg | ORAL_TABLET | Freq: Two times a day (BID) | ORAL | Status: DC
  Administered 2023-09-30 – 2023-10-08 (×16): 1000 mg via ORAL
  Filled 2023-09-30 (×16): qty 2

## 2023-09-30 MED ORDER — OXYCODONE HCL 5 MG PO TABS
7.5000 mg | ORAL_TABLET | Freq: Four times a day (QID) | ORAL | Status: DC | PRN
  Administered 2023-09-30 – 2023-10-02 (×6): 7.5 mg via ORAL
  Filled 2023-09-30 (×6): qty 2

## 2023-09-30 NOTE — Assessment & Plan Note (Signed)
Glucoses have been stable in hospital. - metformin 1000 mg twice daily

## 2023-09-30 NOTE — Assessment & Plan Note (Addendum)
Pain today is well-controlled, post op day 3.  Pending CIR insurance authorization. - Tylenol 650 mg q6h scheduled  - Space oxycodone 7.5 to q6h PRN for pain - Gabapentin 600mg  3 times daily - on ASA, atorvastatin for PAD

## 2023-09-30 NOTE — Assessment & Plan Note (Signed)
>>  ASSESSMENT AND PLAN FOR ANEMIA WRITTEN ON 09/30/2023  6:53 AM BY Jeree Delcid, DO  Hgb stable today at 7.6. No further monitoring given stability.

## 2023-09-30 NOTE — Progress Notes (Signed)
Date/time*** Daily Progress Note Intern Pager: [phone]  Patient name: [name] Medical record number: [id number] Date of birth: Jul 30, 1951 Age: 72 y.o. Gender: male  Primary Care Provider: Erick Alley, DO Consultants: Vascular surgery Code Status: Full  Pt Overview and Major Events to Date:  9/26-admitted 10/2 right AKA  Assessment and Plan:  [name] is a 72 year old male s/p right AKA due to gangrene of toes awaiting CIR placement. Pertinent PMH/PSH includes T2DM, HTN, HLD, peripheral neuropathy, bilateral AKA.   Medically stable for CIR Assessment & Plan S/P AKA (above knee amputation), right (HCC) Pain today is well-controlled, post op day 4.  Pending CIR insurance authorization. - Tylenol 650 mg q6h scheduled  - oxycodone 7.5 to q6h PRN for pain - Gabapentin 600mg  3 times daily - on ASA, atorvastatin for PAD - bowel regimen of MiraLAX and Colace Anemia Hgb stable at 7.6 10/5.  -No further monitoring given stability. 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 -metformin 1000 mg twice daily   Chronic and Stable Issues: HTN-holding home medications for soft blood pressures HLD-continue atorvastatin and Zetia Neuropathy-continuing gabapentin Mood-continue home fluoxetine Reflux-continuing home Protonix  FEN/GI: Carb modified PPx: Heparin Dispo:CIR pending placement  Subjective:  ***  Objective: Temp:  [97.9 F (36.6 C)-99.1 F (37.3 C)] 97.9 F (36.6 C) (10/05 1900) Pulse Rate:  [76-86] 84 (10/05 2039) Resp:  [17-20] 18 (10/05 1649) BP: (79-105)/(61-80) 105/80 (10/05 1847) SpO2:  [97 %-100 %] 97 % (10/05 2039) Physical Exam: General: *** Cardiovascular: *** Respiratory: *** Abdomen: *** Extremities: ***  Laboratory: Most recent CBC Lab Results  Component Value Date   WBC 9.4 09/30/2023   HGB 7.6 (L) 09/30/2023   HCT 26.5 (L) 09/30/2023   MCV 71.8 (L)  09/30/2023   PLT 336 09/30/2023   Most recent BMP    Latest Ref Rng & Units 09/29/2023    4:33 AM  BMP  Glucose 70 - 99 mg/dL 259   BUN 8 - 23 mg/dL 13   Creatinine 5.63 - 1.24 mg/dL 8.75   Sodium 643 - 329 mmol/L 134   Potassium 3.5 - 5.1 mmol/L 3.9   Chloride 98 - 111 mmol/L 96   CO2 22 - 32 mmol/L 29   Calcium 8.9 - 10.3 mg/dL 8.9     Imaging/Diagnostic Tests: None recent   Levin Erp, MD 09/30/2023, 10:15 PM  PGY-3, [place] [facility] FPTS Intern pager: [phone], text pages welcome Secure chat group [facility] Teaching Service

## 2023-09-30 NOTE — Assessment & Plan Note (Signed)
Patient's blood sugars remain acceptable, recommend restarting home regimen metformin 1000 mg twice daily on discharge.

## 2023-09-30 NOTE — Assessment & Plan Note (Signed)
Pain today is well-controlled, post op day 4.  Pending CIR insurance authorization. - Tylenol 650 mg q6h scheduled  - oxycodone 7.5 to q6h PRN for pain - Gabapentin 600mg  3 times daily - on ASA, atorvastatin for PAD - bowel regimen of MiraLAX and Colace

## 2023-09-30 NOTE — Assessment & Plan Note (Addendum)
Hgb stable today at 7.6. No further monitoring given stability.

## 2023-09-30 NOTE — Assessment & Plan Note (Signed)
>>  ASSESSMENT AND PLAN FOR ANEMIA WRITTEN ON 10/01/2023  5:47 AM BY Levin Erp, MD  Post surgical anemia. Hgb stable at 7.6 10/5. S/p IV Iron 10/5, plan to start oral iron today.  - Start ferrous sulfate 325 mg every other day - No further monitoring given stability

## 2023-09-30 NOTE — Progress Notes (Addendum)
Daily Progress Note Intern Pager: [phone]  Patient name: [name] Medical record number: [id number] Date of birth: 1951/04/15 Age: 72 y.o. Gender: male  Primary Care Provider: Erick Alley, DO Consultants: Vascular surgery Code Status: Full  Pt Overview and Major Events to Date:  9/26: Admitted 10/2: Right AKA  Assessment and Plan: RUSHI ZINGG is a 72 y.o. male who is status post right AKA awaiting CIR placement. Pertinent PMH/PSH includes T2DM, HTN, HLD, peripheral neuropathy, bilateral AKA.  Assessment & Plan S/P AKA (above knee amputation), right (HCC) Pain today is well-controlled, post op day 3.  Pending CIR insurance authorization. - Tylenol 650 mg q6h scheduled  - Space oxycodone 7.5 to q6h PRN for pain - Gabapentin 600mg  3 times daily - on ASA, atorvastatin for PAD Anemia Hgb stable today at 7.6. No further monitoring given stability. 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  Chronic and Stable Issues: HTN-holding home meds for soft BP HLD-continue atorvastatin and Zetia Neuropathy-continue gabapentin at adjusted dose Mood-continue home fluoxetine  FEN/GI: Carb modified PPx: Heparin Dispo:CIR today. Barriers include placement.   Subjective:  Notes his pain at this time is tolerable, no complaints.  Objective: Temp:  [98.1 F (36.7 C)-99.1 F (37.3 C)] 98.2 F (36.8 C) (10/05 0338) Pulse Rate:  [76-90] 76 (10/05 0338) Resp:  [15-20] 20 (10/05 0338) BP: (59-104)/(41-74) 104/67 (10/05 0338) SpO2:  [100 %] 100 % (10/05 0338) Physical Exam: General: Well-appearing, NAD Cardiovascular: RRR, no murmurs auscultated Respiratory: CTAB, normal WOB Abdomen: Normoactive bowel sounds, nontender throughout Extremities: Bilateral AKA, appears to be healing well without signs of  necrosis  Laboratory: Most recent CBC Lab Results  Component Value Date   WBC 9.4 09/30/2023   HGB 7.6 (L) 09/30/2023   HCT 26.5 (L) 09/30/2023   MCV 71.8 (L) 09/30/2023   PLT 336 09/30/2023   Most recent BMP    Latest Ref Rng & Units 09/29/2023    4:33 AM  BMP  Glucose 70 - 99 mg/dL 629   BUN 8 - 23 mg/dL 13   Creatinine 5.28 - 1.24 mg/dL 4.13   Sodium 244 - 010 mmol/L 134   Potassium 3.5 - 5.1 mmol/L 3.9   Chloride 98 - 111 mmol/L 96   CO2 22 - 32 mmol/L 29   Calcium 8.9 - 10.3 mg/dL 8.9    Imaging/Diagnostic Tests: No recent imaging results Shelby Mattocks, DO 09/30/2023, 6:53 AM  PGY-3, [place] [facility] FPTS Intern pager: [phone], text pages welcome Secure chat group [facility] Teaching Service

## 2023-09-30 NOTE — Assessment & Plan Note (Signed)
Post surgical anemia. Hgb stable at 7.6 10/5. S/p IV Iron 10/5, plan to start oral iron today.  - Start ferrous sulfate 325 mg every other day - No further monitoring given stability

## 2023-10-01 DIAGNOSIS — Z89611 Acquired absence of right leg above knee: Secondary | ICD-10-CM | POA: Diagnosis not present

## 2023-10-01 MED ORDER — FERROUS SULFATE 325 (65 FE) MG PO TABS
325.0000 mg | ORAL_TABLET | ORAL | Status: DC
  Administered 2023-10-01 – 2023-10-07 (×4): 325 mg via ORAL
  Filled 2023-10-01 (×4): qty 1

## 2023-10-01 NOTE — Progress Notes (Signed)
Mobility Specialist Progress Note    10/01/23 1018  Mobility  Activity Dangled on edge of bed  Level of Assistance Minimal assist, patient does 75% or more  Assistive Device Other (Comment) (bed rails)  RLE Weight Bearing NWB  Activity Response Tolerated well  Mobility Referral Yes  $Mobility charge 1 Mobility  Mobility Specialist Start Time (ACUTE ONLY) [id number]  Mobility Specialist Stop Time (ACUTE ONLY) 1017  Mobility Specialist Time Calculation (min) (ACUTE ONLY) 21 min   Pt received and agreeable. C/o 8/10 stump pain. Pt able to manage bed mobility with bed rails and minA for scooting. Pt stated he felt like his butt muscles weren't strong enough yet to scoot. Able to sit EOB with contact guard for almost 10 minutes. Pt stated he didn't even get tired but was ready to return to supine to complete bed change and take meds. Pt left in bed w/ HOB up and RN present to distribute meds.   [place] Mobility Specialist  Please Neurosurgeon or Rehab Office at [phone]

## 2023-10-02 DIAGNOSIS — Z89611 Acquired absence of right leg above knee: Secondary | ICD-10-CM | POA: Diagnosis not present

## 2023-10-02 LAB — SURGICAL PATHOLOGY

## 2023-10-02 MED ORDER — OXYCODONE HCL 5 MG PO TABS
5.0000 mg | ORAL_TABLET | Freq: Four times a day (QID) | ORAL | Status: DC | PRN
  Administered 2023-10-02 – 2023-10-03 (×3): 5 mg via ORAL
  Filled 2023-10-02 (×3): qty 1

## 2023-10-02 NOTE — Progress Notes (Signed)
Mobility Specialist Progress Note:   10/02/23 1400  Mobility  Activity Transferred from chair to bed  Level of Assistance Moderate assist, patient does 50-74%  Assistive Device  (+2)  RLE Weight Bearing NWB  Activity Response Tolerated well  Mobility Referral Yes  $Mobility charge 1 Mobility  Mobility Specialist Start Time (ACUTE ONLY) 1410  Mobility Specialist Stop Time (ACUTE ONLY) 1420  Mobility Specialist Time Calculation (min) (ACUTE ONLY) 10 min   Pt received in chair, requesting to transfer to bed per RN request. ModA +2 with lateral scoot from chair to bed. Pt displayed slight unsteadiness needing hands on to maintain upright posture. Pt c/o slight discomfort from RLE, otherwise asymptomatic throughout. Pt left in bed with RN present in room.    Leory Plowman  Mobility Specialist Please contact via [employer] office at [phone]

## 2023-10-02 NOTE — Progress Notes (Signed)
[facility] [place]  Subjective  -no complaints   Objective 90/71 78 98.3 F (36.8 C) (Oral) 18 99%  Intake/Output Summary (Last 24 hours) at 10/02/2023 0813 Last data filed at 10/02/2023 0300 Gross per 24 hour  Intake 720 ml  Output 1600 ml  Net -880 ml    Right AKA with staples intact  Laboratory Lab Results: Recent Labs    09/30/23 0351  WBC 9.4  HGB 7.6*  HCT 26.5*  PLT 336   BMET No results for input(s): "NA", "K", "CL", "CO2", "GLUCOSE", "BUN", "CREATININE", "CALCIUM" in the last 72 hours.  COAG Lab Results  Component Value Date   INR 1.2 08/09/2023   INR 1.1 12/05/2020   INR 1.1 04/24/2020   No results found for: "PTT"  Assessment/Planning:  72 year old male status post right above-knee amputation.  This continues to look excellent.  Discussed we will leave staples 3 to 4 weeks.  Will arrange follow-up in our office for staple removal.  [name] 10/02/2023 8:13 AM --

## 2023-10-02 NOTE — Progress Notes (Signed)
Physical Therapy Treatment Patient Details Name: [name] MRN: [id number] DOB: Nov 19, 1951 Today's Date: 10/02/2023   History of Present Illness Patient is a 72 y/o male presenting with gangrene of R toes. Underwent LE angiogram on 9/30 though revascularization options limited. R AKA 10/2. PMH: PAD, tobacco use, prior L AKA, DM, diabetic neuropathy, anemia, HTN.    PT Comments  Pt received in supine and agreeable to session. Pt demonstrating improved mobility this session, requiring up to min A to sit to EOB and lateral scoot to recliner. Pt requires increased time to complete tasks due to pain and quick fatigue. Pt requires intermittent assist for balance after LOB due to change in center of gravity. Pt continues to benefit from PT services to progress toward functional mobility goals.    If plan is discharge home, recommend the following: Two people to help with walking and/or transfers;Two people to help with bathing/dressing/bathroom   Can travel by private vehicle        Equipment Recommendations  Other (comment)    Recommendations for Other Services       Precautions / Restrictions Precautions Precautions: Fall;Other (comment) Precaution Comments: chronic L AKA (does not wear prosthetic) Restrictions Weight Bearing Restrictions: Yes RLE Weight Bearing: Non weight bearing     Mobility  Bed Mobility Overal bed mobility: Needs Assistance Bed Mobility: Supine to Sit     Supine to sit: Min assist, HOB elevated, Used rails     General bed mobility comments: Min A for trunk elevation and to prevent posterior LOB sitting EOB    Transfers Overall transfer level: Needs assistance Equipment used: None Transfers: Bed to chair/wheelchair/BSC            Lateral/Scoot Transfers: Min assist General transfer comment: Min A with bedpad for scooting and intermittently for balance       Balance Overall balance assessment: Needs assistance Sitting-balance support:  Bilateral upper extremity supported Sitting balance-Leahy Scale: Poor Sitting balance - Comments: sitting EOB with intermittent posterior LOB requiring assist to correct                                    Cognition Arousal: Alert Behavior During Therapy: WFL for tasks assessed/performed Overall Cognitive Status: Within Functional Limits for tasks assessed                                          Exercises      General Comments        Pertinent Vitals/Pain Pain Assessment Pain Assessment: Faces Faces Pain Scale: Hurts even more Pain Location: R LE Pain Descriptors / Indicators: Guarding, Grimacing Pain Intervention(s): Limited activity within patient's tolerance, Monitored during session, Repositioned     PT Goals (current goals can now be found in the care plan section) Acute Rehab PT Goals Patient Stated Goal: rehab then home PT Goal Formulation: With patient Time For Goal Achievement: 10/12/23 Progress towards PT goals: Progressing toward goals    Frequency    Min 1X/week       AM-PAC PT "6 Clicks" Mobility   Outcome Measure  Help needed turning from your back to your side while in a flat bed without using bedrails?: A Little Help needed moving from lying on your back to sitting on the side of a flat bed without using bedrails?:  A Little Help needed moving to and from a bed to a chair (including a wheelchair)?: A Little Help needed standing up from a chair using your arms (e.g., wheelchair or bedside chair)?: Total Help needed to walk in hospital room?: Total Help needed climbing 3-5 steps with a railing? : Total 6 Click Score: 12    End of Session   Activity Tolerance: Patient tolerated treatment well Patient left: in chair;with call bell/phone within reach Nurse Communication: Mobility status PT Visit Diagnosis: Other abnormalities of gait and mobility (R26.89);Pain     Time: 2376-2831 PT Time Calculation (min)  (ACUTE ONLY): 18 min  Charges:    $Therapeutic Activity: 8-22 mins PT General Charges $$ ACUTE PT VISIT: 1 Visit                     Johny Shock, PTA Acute Rehabilitation Services Secure Chat Preferred  [phone]    Johny Shock 10/02/2023, 12:18 PM

## 2023-10-02 NOTE — Progress Notes (Signed)
Inpatient Rehab Admissions Coordinator:   Continue to await determination from [facility] regarding CIR prior auth request.  I will contact their supervisor today for assist in expediting case.   Estill Dooms, PT, DPT Admissions Coordinator [phone] 10/02/23  10:48 AM

## 2023-10-02 NOTE — Plan of Care (Signed)
  Problem: Education: Goal: Understanding of CV disease, CV risk reduction, and recovery process will improve 10/02/2023 1920 by Hermelinda Medicus, RN Outcome: Progressing 10/02/2023 1919 by Hermelinda Medicus, RN Outcome: Progressing Goal: Individualized Educational Video(s) Outcome: Progressing   Problem: Activity: Goal: Ability to return to baseline activity level will improve Outcome: Progressing   Problem: Cardiovascular: Goal: Ability to achieve and maintain adequate cardiovascular perfusion will improve Outcome: Progressing Goal: Vascular access site(s) Level 0-1 will be maintained Outcome: Progressing

## 2023-10-02 NOTE — Assessment & Plan Note (Signed)
Post surgical anemia.  Hemoglobin remains stable at 7.6 patient is asymptomatic- -continue ferrous sulfate supplement

## 2023-10-02 NOTE — Assessment & Plan Note (Addendum)
Pain is well-controlled on postop day 5, still awaiting insurance authorization for CIR. - Tylenol 650 mg q6h scheduled  -As needed oxycodone still in place, increased from 7.5 mg to 5 mg today -Gabapentin 600mg  3 times daily - on ASA, atorvastatin for PAD - bowel regimen of MiraLAX and Colace

## 2023-10-02 NOTE — Plan of Care (Signed)
  Problem: Education: Goal: Understanding of CV disease, CV risk reduction, and recovery process will improve Outcome: Progressing

## 2023-10-02 NOTE — Assessment & Plan Note (Signed)
Glucoses have been stable in hospital. - metformin 1000 mg twice daily

## 2023-10-02 NOTE — Consult Note (Addendum)
[facility] Customer service manager [facility]) Accountable Care Organization (ACO) [facility] Liaison Note  10/02/2023  NIVIN BRANIFF [birth year] [id number]  Covering Charlesetta Shanks remotely [facility] Liaison)  Location: [facility] Liaison screened the patient remotely at [facility].  Insurance: [employer] Advant[employer]   [name] is a 72 y.o. male who is a Primary Care Patient of Erick Alley, DO  [facility]. The patient was screened for readmission hospitalization with noted medium risk score for unplanned readmission risk with 10 IP in 6 months.  The patient was assessed for potential [facility]) Care Management service needs for post hospital transition for care coordination. Review of patient's electronic medical record reveals patient  was admitted for gangrene of the right foot.    Plan: Pt continue to to awaiting CIR placement. Will update hospital liaison covering this pt.   [facility] Care Management/Population Health does not replace or interfere with any arrangements made by the Inpatient Transition of Care team.   For questions contact:   Elliot Cousin, RN, [facility] Liaison Central   Population Health Office Hours MTWF  8:00 am-6:00 pm [phone] mobile [phone] [Office toll free line] Office Hours are M-F 8:30 - 5 pm [email or web address] [email or web address]

## 2023-10-02 NOTE — Assessment & Plan Note (Signed)
>>  ASSESSMENT AND PLAN FOR ANEMIA WRITTEN ON 10/02/2023  9:47 AM BY Gerrit Heck, DO  Post surgical anemia.  Hemoglobin remains stable at 7.6 patient is asymptomatic- -continue ferrous sulfate supplement

## 2023-10-02 NOTE — Progress Notes (Signed)
Daily Progress Note Intern Pager: [phone]  Patient name: [name] Medical record number: [id number] Date of birth: 10/01/1951 Age: 72 y.o. Gender: male  Primary Care Provider: Erick Alley, DO Consultants: Vascular Surgery Code Status: Full  Pt Overview and Major Events to Date:  Admitted: 9/26 Right AKA: 10/2  Assessment and Plan:  [name] is a 72 year old man who initially presented with gangrene of the right second and third digits of the foot, now status post right AKA.  He has a history of type 2 diabetes, hypertension, hyperlipidemia and neuropathy.  Mr. Dull is stable and recovering well, goals of care are pain control and CIR placement. Assessment & Plan S/P AKA (above knee amputation), right (HCC) Pain is well-controlled on postop day 5, still awaiting insurance authorization for CIR. - Tylenol 650 mg q6h scheduled  -As needed oxycodone still in place, increased from 7.5 mg to 5 mg today -Gabapentin 600mg  3 times daily - on ASA, atorvastatin for PAD - bowel regimen of MiraLAX and Colace Anemia Post surgical anemia.  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 - metformin 1000 mg twice daily   Chronic and Stable Problems:  Hypertension: Continue home meds HLD: Continue home meds Neuropathy: Continue gabapentin as above Mood: Continue home fluoxetine Reflux: Continue home Protonix  FEN/GI: Carb modified PPx: Heparin Dispo:CIR  ending placement .   Subjective:  Patient was awake and resting comfortably in bed on exam today.  He states that his pain is better this morning, a 7 out of 10 set of an 8 out of 10.  He has no complaints and is in good spirits  Objective: Temp:  [98.3 F (36.8 C)-98.6 F (37 C)] 98.4 F (36.9 C) (10/07 0828) Pulse Rate:  [78-90] 79 (10/07  0828) Resp:  [16-18] 18 (10/07 0438) BP: (90-131)/(64-77) 119/64 (10/07 0828) SpO2:  [97 %-99 %] 98 % (10/07 1478) Physical Exam: General: Alert, oriented.  Resting comfortably in bed Cardiovascular: Regular rate and rhythm, no M/R/G  respiratory: CTAB, no increased work of breathing Extremities: Bilateral AKA's.  Right stump with surgical staples still in place.  Incision is clean and dry without signs of infection.  Laboratory: Most recent CBC Lab Results  Component Value Date   WBC 9.4 09/30/2023   HGB 7.6 (L) 09/30/2023   HCT 26.5 (L) 09/30/2023   MCV 71.8 (L) 09/30/2023   PLT 336 09/30/2023   Most recent BMP    Latest Ref Rng & Units 09/29/2023    4:33 AM  BMP  Glucose 70 - 99 mg/dL 295   BUN 8 - 23 mg/dL 13   Creatinine 6.21 - 1.24 mg/dL 3.08   Sodium 657 - 846 mmol/L 134   Potassium 3.5 - 5.1 mmol/L 3.9   Chloride 98 - 111 mmol/L 96   CO2 22 - 32 mmol/L 29   Calcium 8.9 - 10.3 mg/dL 8.9     Gerrit Heck, DO 10/02/2023, 9:38 AM  PGY-1, [place] [facility] FPTS Intern pager: [phone], text pages welcome Secure chat group [facility] Teaching Service

## 2023-10-03 DIAGNOSIS — Z89611 Acquired absence of right leg above knee: Secondary | ICD-10-CM | POA: Diagnosis not present

## 2023-10-03 MED ORDER — SENNA 8.6 MG PO TABS
1.0000 | ORAL_TABLET | Freq: Every day | ORAL | Status: DC
  Administered 2023-10-03: 8.6 mg via ORAL
  Filled 2023-10-03: qty 1

## 2023-10-03 MED ORDER — ONDANSETRON HCL 4 MG PO TABS: 4.0000 mg | ORAL_TABLET | Freq: Once | ORAL | Status: DC | PRN

## 2023-10-03 MED ORDER — POLYETHYLENE GLYCOL 3350 17 G PO PACK
17.0000 g | PACK | Freq: Two times a day (BID) | ORAL | Status: DC
  Administered 2023-10-05 – 2023-10-07 (×3): 17 g via ORAL
  Filled 2023-10-03 (×6): qty 1

## 2023-10-03 MED ORDER — OXYCODONE HCL 5 MG PO TABS
5.0000 mg | ORAL_TABLET | Freq: Three times a day (TID) | ORAL | Status: DC | PRN
  Administered 2023-10-04: 5 mg via ORAL
  Filled 2023-10-03: qty 1

## 2023-10-03 NOTE — Assessment & Plan Note (Signed)
>>  ASSESSMENT AND PLAN FOR ANEMIA WRITTEN ON 10/03/2023  9:01 AM BY Gerrit Heck, DO  Post surgical anemia.  Hemoglobin remains stable at 7.6 patient is asymptomatic -continue ferrous sulfate supplement

## 2023-10-03 NOTE — TOC Progression Note (Signed)
Transition of Care [facility]) - Progression Note    Patient Details  Name: [name] MRN: [id number] Date of Birth: 1951-09-10  Transition of Care [facility]) CM/SW Contact  Eduard Roux, [place] Phone Number: 10/03/2023, 3:13 PM  Clinical Narrative:     CSW spoke with patient by phone. CSW introduced self and explained role. CSW discussed with patient short term rehab at [facility] . Patient declined SNF, states he wants to d/c home. Patient states he is agreeable to [facility].   CSW updated medical team. TOC will continue to follow and assist with discharge planning.  Antony Blackbird, MSW, LCSW Clinical Social Worker    Expected Discharge Plan: IP Rehab Facility Barriers to Discharge: Insurance Authorization  Expected Discharge Plan and Services   Discharge Planning Services: CM Consult Post Acute Care Choice: IP Rehab Living arrangements for the past 2 months: Single Family Home                                       Social Determinants of Health (SDOH) Interventions SDOH Screenings   Food Insecurity: No Food Insecurity (09/21/2023)  Housing: Low Risk  (09/21/2023)  Transportation Needs: No Transportation Needs (09/21/2023)  Recent Concern: Transportation Needs - Unmet Transportation Needs (08/09/2023)  Utilities: Not At Risk (09/21/2023)  Recent Concern: Utilities - At Risk (08/09/2023)  Alcohol Screen: Low Risk  (02/10/2023)  Depression (PHQ2-9): Low Risk  (06/14/2023)  Financial Resource Strain: Low Risk  (02/10/2023)  Physical Activity: Insufficiently Active (02/10/2023)  Social Connections: Moderately Isolated (02/10/2023)  Stress: No Stress Concern Present (02/10/2023)  Tobacco Use: High Risk (09/27/2023)    Readmission Risk Interventions    08/17/2023    9:33 AM 02/22/2021   12:24 PM  Readmission Risk Prevention Plan  Post Dischage Appt --   Medication Screening Complete   Transportation Screening Complete Complete  PCP or Specialist Appt within 5-7 Days  Complete  Home  Care Screening  Complete  Medication Review (RN CM)  Complete

## 2023-10-03 NOTE — TOC Progression Note (Signed)
Transition of Care (TOC) - Progression Note  Donn Pierini RN, BSN Transitions of Care Unit 4E- RN Case Manager See Treatment Team for direct phone #   Patient Details  Name: [name] MRN: [id number] Date of Birth: 1951-03-27  Transition of Care [facility]) CM/SW Contact  Zenda Alpers, Lenn Sink, RN Phone Number: 10/03/2023, 3:39 PM  Clinical Narrative:    Notified by CIR liaison that pt is unlikely to get insurance auth and "Dr. Riley Kill reviewed his chart for possible peer to peer and feels that he's stabilized well and there's not a lot to argue for CIR admission from a medical standpoint. With that, and our lack of beds this week, he probably needs to look at SNF vs. HH" CSW has seen pt and has also notified CM that pt has declined SNF placement and prefers to return home w/ HH.   CM in to speak w/ pt at bedside- confirmed pt's choice to return home with [facility]. Pt voiced that he is at home with wife. Voiced that his wife is at home however she has some mobility issues herself and may not be able to assist him much.  Pt voiced that he has wheelchair, rolling walker, BSC, tub bench at home already- needs slide board.  Pt also states that he has not practiced using slide board yet with therapy s/p AKA-  will need to make sure pt can safely transfer w/ slide board prior to return home.  Pt voiced he will plan to have wife transport home.   Discussed HH needs- choice offered for [facility] - pt states he does not have a preference in [facility] provider. CM will reach out to [facility] to see if they have pt on [facility] office referral list.   CM to f/u for HH/DME needs- msg sent to providers that pt will need orders for [facility] as well as DME- slide board (once size is known).      Expected Discharge Plan: Home w Home Health Services Barriers to Discharge: Continued Medical Work up  Expected Discharge Plan and Services   Discharge Planning Services: CM Consult Post Acute Care Choice: Durable Medical Equipment, Home  Health Living arrangements for the past 2 months: Single Family Home                 DME Arranged: Other see comment (slide board) DME Agency: [employer]                   Social Determinants of Health (SDOH) Interventions SDOH Screenings   Food Insecurity: No Food Insecurity (09/21/2023)  Housing: Low Risk  (09/21/2023)  Transportation Needs: No Transportation Needs (09/21/2023)  Recent Concern: Transportation Needs - Unmet Transportation Needs (08/09/2023)  Utilities: Not At Risk (09/21/2023)  Recent Concern: Utilities - At Risk (08/09/2023)  Alcohol Screen: Low Risk  (02/10/2023)  Depression (PHQ2-9): Low Risk  (06/14/2023)  Financial Resource Strain: Low Risk  (02/10/2023)  Physical Activity: Insufficiently Active (02/10/2023)  Social Connections: Moderately Isolated (02/10/2023)  Stress: No Stress Concern Present (02/10/2023)  Tobacco Use: High Risk (09/27/2023)    Readmission Risk Interventions    08/17/2023    9:33 AM 02/22/2021   12:24 PM  Readmission Risk Prevention Plan  Post Dischage Appt --   Medication Screening Complete   Transportation Screening Complete Complete  PCP or Specialist Appt within 5-7 Days  Complete  Home Care Screening  Complete  Medication Review (RN CM)  Complete

## 2023-10-03 NOTE — Progress Notes (Addendum)
Inpatient Rehab Admissions Coordinator:   Insurance requesting peer to peer.  Dr. Riley Kill to review.  Will follow.   1122: Dr. Riley Kill reviewed and feels pt has stabilized since date of consult last week.  Labs look good and pain is better controlled.  He does not feel we have much to argue as far as medical necessity for CIR admit.  Recommend SNF vs HH.  I let TOC and medical team know.   Estill Dooms, PT, DPT Admissions Coordinator [phone] 10/03/23  10:06 AM

## 2023-10-03 NOTE — Assessment & Plan Note (Signed)
Glucoses have been stable in hospital. - metformin 1000 mg twice daily

## 2023-10-03 NOTE — Progress Notes (Signed)
Daily Progress Note Intern Pager: [phone]  Patient name: LEEANDREW KINDSCHI Medical record number: [id number] Date of birth: 03/25/1951 Age: 72 y.o. Gender: male  Primary Care Provider: Erick Alley, DO Consultants: vascular surgery Code Status: Full  Pt Overview and Major Events to Date:  Admitted: 9/26 Right AKA:10/2  Assessment and Plan:  Eunice Seim is a 72 year old gentleman initially presenting with gangrene of the second and third digits of the right foot now status post above-knee amputation.  He has a history of diabetes, hypertension, hyperlipidemia and neuropathy.  Mr. Baltzer is in stable condition and recovering well, our goals for him are pain control and PT OT while awaiting placement at CIR. Assessment & Plan S/P AKA (above knee amputation), right (HCC) Pain well-controlled on postop day 6, decreased oxycodone yesterday and patient reports no increase in pain from yesterday.  PT OT is working with him.  Gently he does not qualify for CIR placement per his insurance.  He will think about whether or not he would prefer to go to SNF or go home with home health. - Tylenol 650 mg q6h scheduled  -Continue as needed oxycodone 5 mg Q6 -Gabapentin 600mg  3 times daily - on ASA, atorvastatin for PAD -Increase MiraLAX to twice a day and senna once a day Anemia Post surgical anemia.  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 - metformin 1000 mg twice daily   Chronic and Stable Problems:  Hypertension: Home medications held, blood pressures stable Hyperlipidemia: Continue home atorvastatin and Zetia Neuropathy-gabapentin as above Mood-continue home fluoxetine Reflux-continue home Protonix   FEN/GI: Carb modified PPx: Heparin Dispo:SNF/HH  pending patient preference .   Subjective:  Patient was  awake lying comfortably in bed on evaluation this morning.  He states that his pain has not increased since yesterday he is tolerating the decrease in medication well.  His stump was somewhat sore this morning but he stated that it was no worse than it had been since his surgery.  But he reports that it is no worse than it has been.  He has no other complaints or concerns at this time.  Objective: Temp:  [98 F (36.7 C)-98.7 F (37.1 C)] 98.2 F (36.8 C) (10/08 0810) Pulse Rate:  [82-94] 87 (10/08 0810) Resp:  [17-18] 17 (10/08 0810) BP: (103-154)/(69-87) 108/75 (10/08 0810) SpO2:  [98 %-100 %] 98 % (10/08 0810) Physical Exam: General: Alert, oriented.  No apparent distress Cardiovascular: Regular rate and rhythm, no M/R/G Respiratory: CTAB, no increased work of breathing Abdomen: Flat, soft, nontender. Extremities: Bilateral AKA of the lower extremity.  Right surgical stump clean and well-maintained.  No signs of skin breakdown.  Surgical staples in place.  Laboratory: Most recent CBC Lab Results  Component Value Date   WBC 9.4 09/30/2023   HGB 7.6 (L) 09/30/2023   HCT 26.5 (L) 09/30/2023   MCV 71.8 (L) 09/30/2023   PLT 336 09/30/2023   Most recent BMP    Latest Ref Rng & Units 09/29/2023    4:33 AM  BMP  Glucose 70 - 99 mg/dL 536   BUN 8 - 23 mg/dL 13   Creatinine 6.44 - 1.24 mg/dL 0.34   Sodium 742 - 595 mmol/L 134   Potassium 3.5 - 5.1 mmol/L 3.9   Chloride 98 - 111 mmol/L 96   CO2 22 - 32 mmol/L 29  Calcium 8.9 - 10.3 mg/dL 8.9    Gerrit Heck, DO 10/03/2023, 8:53 AM  PGY-1, [facility] Family Medicine FPTS Intern pager: [phone], text pages welcome Secure chat group [facility] Teaching Service

## 2023-10-03 NOTE — Assessment & Plan Note (Signed)
Post surgical anemia.  Hemoglobin remains stable at 7.6 patient is asymptomatic- -continue ferrous sulfate supplement

## 2023-10-03 NOTE — Assessment & Plan Note (Addendum)
Pain well-controlled on postop day 6, decreased oxycodone yesterday and patient reports no increase in pain from yesterday.  PT OT is working with him.  Gently he does not qualify for CIR placement per his insurance.  He will think about whether or not he would prefer to go to SNF or go home with home health. - Tylenol 650 mg q6h scheduled  -Continue as needed oxycodone 5 mg Q6 -Gabapentin 600mg  3 times daily - on ASA, atorvastatin for PAD -Increase MiraLAX to twice a day and senna once a day

## 2023-10-04 DIAGNOSIS — Z89611 Acquired absence of right leg above knee: Secondary | ICD-10-CM | POA: Diagnosis not present

## 2023-10-04 LAB — CBC
HCT: 31.3 % — ABNORMAL LOW (ref 39.0–52.0)
Hemoglobin: 9.1 g/dL — ABNORMAL LOW (ref 13.0–17.0)
MCH: 21.4 pg — ABNORMAL LOW (ref 26.0–34.0)
MCHC: 29.1 g/dL — ABNORMAL LOW (ref 30.0–36.0)
MCV: 73.6 fL — ABNORMAL LOW (ref 80.0–100.0)
Platelets: 508 10*3/uL — ABNORMAL HIGH (ref 150–400)
RBC: 4.25 MIL/uL (ref 4.22–5.81)
RDW: 21.6 % — ABNORMAL HIGH (ref 11.5–15.5)
WBC: 10 10*3/uL (ref 4.0–10.5)
nRBC: 0.3 % — ABNORMAL HIGH (ref 0.0–0.2)

## 2023-10-04 MED ORDER — OXYCODONE HCL 5 MG PO TABS
5.0000 mg | ORAL_TABLET | Freq: Two times a day (BID) | ORAL | Status: DC | PRN
  Administered 2023-10-04 – 2023-10-08 (×7): 5 mg via ORAL
  Filled 2023-10-04 (×7): qty 1

## 2023-10-04 NOTE — Progress Notes (Signed)
Daily Progress Note Intern Pager: [phone]  Patient name: [name] Medical record number: [id number] Date of birth: 07-17-1951 Age: 72 y.o. Gender: male  Primary Care Provider: Erick Alley, DO Consultants: Vascular surgery Code Status: Full  Pt Overview and Major Events to Date:  Admitted: AKA:  Assessment and Plan:  [name] is a pleasant 72 year old male admitted for gangrene of the second and third digit of the right lower limb now status post above-knee amputation.  He has a history of left AKA, type 2 diabetes with neuropathy, hypertension, hyperlipidemia.  Today is postop day 7 and he continues to do well. Pain is well controlled, and he will work with PT while awaiting SNF placement. Assessment & Plan S/P AKA (above knee amputation), right (HCC) Pain well-controlled on postop day 7, decreased oxycodone yesterday and patient reports no increase in pain from yesterday.  PT OT is working with him well awaiting placement in a SNF facility. - Tylenol 650 mg q6h scheduled  -Continue as needed oxycodone 5 mg every 12 hours by her to physical therapy -Gabapentin 600mg  3 times daily - on ASA, atorvastatin for PAD -Continue MiraLAX to twice a day, continue senna Anemia Post surgical anemia.  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 - metformin 1000 mg twice daily   Chronic and Stable Problems:  Hypertension: BPs have been acceptable, continue to hold home meds HLD: Continue home atorvastatin and Zetia Neuropathy: Gabapentin as above Mood: Continue home fluoxetine Reflux: Continue home Protonix   FEN/GI: Carb modified PPx: heparin injection every 8 Dispo:SNF  pending placement .   Subjective:  [name] was awake sitting up in bed when I evaluated him this morning.  He was in good spirits  despite not sleeping well last night due to a beeping noise made by some equipment.  He reports no pain or difficulty and is eager to continue working on his ambulation.  He had not yet been seen by PT on evaluation and so we did not fully discuss SNF placement at that time but he was agreeable to SNF placement on discussion with physical therapy.  Objective: Temp:  [98 F (36.7 C)-98.7 F (37.1 C)] 98.3 F (36.8 C) (10/09 1201) Pulse Rate:  [86-96] 96 (10/09 1201) Resp:  [18-20] 19 (10/09 1201) BP: (100-130)/(68-86) 104/86 (10/09 1201) SpO2:  [95 %-97 %] 95 % (10/09 0835) Physical Exam: General: Alert, oriented.  No obvious distress Cardiovascular: RRR, S1-S2 present.  No M/R/G Respiratory: CTAB, no increased work of breathing Abdomen: Flat, soft, nontender Extremities: Right AKA incision site clean and dry.  Surgical staples in place no drainage or signs of infection  Laboratory: Most recent CBC Lab Results  Component Value Date   WBC 10.0 10/04/2023   HGB 9.1 (L) 10/04/2023   HCT 31.3 (L) 10/04/2023   MCV 73.6 (L) 10/04/2023   PLT 508 (H) 10/04/2023   Most recent BMP    Latest Ref Rng & Units 09/29/2023    4:33 AM  BMP  Glucose 70 - 99 mg/dL 696   BUN 8 - 23 mg/dL 13   Creatinine 2.95 - 1.24 mg/dL 2.84   Sodium 132 - 440 mmol/L 134   Potassium 3.5 - 5.1 mmol/L 3.9   Chloride 98 - 111 mmol/L 96   CO2 22 - 32 mmol/L 29   Calcium 8.9 - 10.3  mg/dL 8.9     Brian Heck, DO 10/04/2023, 12:20 PM  PGY-1, [facility] Family Medicine FPTS Intern pager: [phone], text pages welcome Secure chat group [facility] Teaching Service

## 2023-10-04 NOTE — NC FL2 (Signed)
[place] MEDICAID FL2 LEVEL OF CARE FORM     IDENTIFICATION  Patient Name: MAYSIN MOEHRING Birthdate: 10/19/1951 Sex: male Admission Date (Current Location): 09/21/2023  [facility] and [place] Number:  Producer, television/film/video and Address:  [facility], [address] [place], [place] [address]      Provider Number: [id number]  Attending Physician Name and Address:  Caro Laroche, DO  Relative Name and Phone Number:       Current Level of Care: Hospital Recommended Level of Care: Skilled Nursing Facility Prior Approval Number:    Date Approved/Denied:   PASRR Number: [id number]  Discharge Plan: SNF    Current Diagnoses: Patient Active Problem List   Diagnosis Date Noted   S/P AKA (above knee amputation), right (HCC) 09/21/2023   Critical limb ischemia of right lower extremity (HCC) 08/09/2023   Bilateral shoulder pain 06/14/2023   Fatty liver 03/30/2021   Imaging of gastrointestinal tract abnormal 03/30/2021   History of colonic polyps 03/30/2021   S/P above knee amputation, left (HCC) 02/19/2021   Lung nodule < 6cm on CT 01/28/2019   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) 01/18/2019   Depression 10/03/2016   Anemia 09/10/2015   Diabetic neuropathy (HCC) 02/27/2014   Iron deficiency anemia 08/01/2012   Tobacco use disorder 02/22/2007   Primary hypertension 02/22/2007   PAD (peripheral artery disease) (HCC) 02/22/2007    Orientation RESPIRATION BLADDER Height & Weight     Self, Time, Situation, Place  Normal Continent Weight: 180 lb 12.4 oz (82 kg) Height:  5\' 7"  (170.2 cm)  BEHAVIORAL SYMPTOMS/MOOD NEUROLOGICAL BOWEL NUTRITION STATUS      Continent Diet (please see discharge summary)  AMBULATORY STATUS COMMUNICATION OF NEEDS Skin   Limited Assist Verbally Surgical wounds (closed incision Right Thigh, wound/incision anterior righ toe)                       Personal Care Assistance Level  of Assistance  Bathing, Feeding, Dressing Bathing Assistance: Limited assistance Feeding assistance: Independent Dressing Assistance: Limited assistance     Functional Limitations Info  Sight, Hearing, Speech Sight Info: Adequate (glassess) Hearing Info: Adequate Speech Info: Adequate    SPECIAL CARE FACTORS FREQUENCY  PT (By licensed PT), OT (By licensed OT)     PT Frequency: 5x per week OT Frequency: 5x per week            Contractures Contractures Info: Not present    Additional Factors Info  Code Status, Allergies Code Status Info: FULL Allergies Info: Glipizide           Current Medications (10/04/2023):  This is the current hospital active medication list Current Facility-Administered Medications  Medication Dose Route Frequency Provider Last Rate Last Admin   acetaminophen (TYLENOL) tablet 650 mg  650 mg Oral Q6H Rhyne, Samantha J, PA-C   650 mg at 10/04/23 0923   albuterol (PROVENTIL) (2.5 MG/3ML) 0.083% nebulizer solution 2.5 mg  2.5 mg Inhalation Q6H PRN Darral Dash, DO       aspirin EC tablet 81 mg  81 mg Oral Daily Dameron, Marisa, DO   81 mg at 10/04/23 0923   atorvastatin (LIPITOR) tablet 40 mg  40 mg Oral Daily Rhyne, Ames Coupe, PA-C   40 mg at 10/04/23 4403   ezetimibe (ZETIA) tablet 10 mg  10 mg Oral Daily Rhyne, Samantha J, PA-C   10 mg at 10/04/23 0923   feeding  supplement (ENSURE ENLIVE / ENSURE PLUS) liquid 237 mL  237 mL Oral BID BM Rhyne, Samantha J, PA-C   237 mL at 10/04/23 1914   ferrous sulfate tablet 325 mg  325 mg Oral Janece Canterbury, MD   325 mg at 10/03/23 1058   FLUoxetine (PROZAC) capsule 10 mg  10 mg Oral Daily Rhyne, Samantha J, PA-C   10 mg at 10/04/23 7829   gabapentin (NEURONTIN) capsule 600 mg  600 mg Oral TID Dara Lords, PA-C   600 mg at 10/04/23 5621   heparin injection 5,000 Units  5,000 Units Subcutaneous Q8H Rhyne, Ames Coupe, PA-C   5,000 Units at 10/04/23 3086   metFORMIN (GLUCOPHAGE) tablet 1,000 mg   1,000 mg Oral BID WC Dameron, Nolberto Hanlon, DO   1,000 mg at 10/04/23 5784   nicotine (NICODERM CQ - dosed in mg/24 hr) patch 7 mg  7 mg Transdermal Daily Rhyne, Samantha J, PA-C   7 mg at 10/04/23 6962   nutrition supplement (JUVEN) (JUVEN) powder packet 1 packet  1 packet Oral BID BM Rhyne, Samantha J, PA-C   1 packet at 10/03/23 1347   ondansetron (ZOFRAN) tablet 4 mg  4 mg Oral Once PRN Evette Georges, MD       oxyCODONE (Oxy IR/ROXICODONE) immediate release tablet 5 mg  5 mg Oral Q12H PRN Dameron, Marisa, DO       pantoprazole (PROTONIX) EC tablet 40 mg  40 mg Oral Daily Rhyne, Samantha J, PA-C   40 mg at 10/04/23 9528   polyethylene glycol (MIRALAX / GLYCOLAX) packet 17 g  17 g Oral BID Evette Georges, MD         Discharge Medications: Please see discharge summary for a list of discharge medications.  Relevant Imaging Results:  Relevant Lab Results:   Additional Information SSN [id number]  Eduard Roux, LCSW

## 2023-10-04 NOTE — TOC Progression Note (Signed)
Transition of Care [facility]) - Progression Note    Patient Details  Name: [name] MRN: [id number] Date of Birth: [birth year]  Transition of Care [facility]) CM/SW Contact  Carmina Miller, LCSWA Phone Number: 10/04/2023, 2:45 PM  Clinical Narrative:     Bed offers provided to pt, he requests time to consider options, will continue to follow.   Expected Discharge Plan: Skilled Nursing Facility Barriers to Discharge: Continued Medical Work up  Expected Discharge Plan and Services   Discharge Planning Services: CM Consult Post Acute Care Choice: Durable Medical Equipment, Home Health Living arrangements for the past 2 months: Single Family Home                 DME Arranged: Other see comment (slide board) DME Agency: [employer]                   Social Determinants of Health (SDOH) Interventions SDOH Screenings   Food Insecurity: No Food Insecurity (09/21/2023)  Housing: Low Risk  (09/21/2023)  Transportation Needs: No Transportation Needs (09/21/2023)  Recent Concern: Transportation Needs - Unmet Transportation Needs (08/09/2023)  Utilities: Not At Risk (09/21/2023)  Recent Concern: Utilities - At Risk (08/09/2023)  Alcohol Screen: Low Risk  (02/10/2023)  Depression (PHQ2-9): Low Risk  (06/14/2023)  Financial Resource Strain: Low Risk  (02/10/2023)  Physical Activity: Insufficiently Active (02/10/2023)  Social Connections: Moderately Isolated (02/10/2023)  Stress: No Stress Concern Present (02/10/2023)  Tobacco Use: High Risk (09/27/2023)    Readmission Risk Interventions    08/17/2023    9:33 AM 02/22/2021   12:24 PM  Readmission Risk Prevention Plan  Post Dischage Appt --   Medication Screening Complete   Transportation Screening Complete Complete  PCP or Specialist Appt within 5-7 Days  Complete  Home Care Screening  Complete  Medication Review (RN CM)  Complete

## 2023-10-04 NOTE — Progress Notes (Signed)
Physical Therapy Treatment Patient Details Name: [name] MRN: [id number] DOB: 1951-07-05 Today's Date: 10/04/2023   History of Present Illness Patient is a 72 y/o male presenting with gangrene of R toes. Underwent LE angiogram on 9/30 though revascularization options limited. R AKA 10/2. PMH: PAD, tobacco use, prior L AKA, DM, diabetic neuropathy, anemia, HTN.    PT Comments  Pt received in supine and agreeable to session. Session focused on transfers to and from w/c to ensure safety at home. Pt requiring heavy use of bed features to sit to EOB and intermittent assist for sitting balance due to posterior bias. Education provided on change in center of gravity since new AKA. Pt attempting x2 to transfer to w/c using SB, however demonstrates increased difficulty. Pt is able to transfer to the w/c without SB and mod A, then back to bed using the SB and min A. Due to increased difficulty and RLE pain with transfers, pt now agreeable to continued inpatient follow up therapy, <3 hours/day to optimize progress towards functional independence and to ensure safety upon return home. After discussion with supervising PT Ferd Glassing., discharge recommendations were updated accordingly. Pt continues to benefit from PT services to progress toward functional mobility goals.    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) Restrictions Weight Bearing Restrictions: Yes RLE Weight Bearing: Non weight bearing     Mobility  Bed Mobility Overal bed mobility: Needs Assistance Bed Mobility: Supine to Sit, Sit to Supine     Supine to sit:  Contact guard, Used rails, HOB elevated Sit to supine: Contact guard assist   General bed mobility comments: Heavy use of rail for trunk elevation and increased time to scoot forward to EOB    Transfers Overall transfer level: Needs assistance Equipment used: None, Sliding board Transfers: Bed to chair/wheelchair/BSC             General transfer comment: Attempted transfer from bed to w/c with sliding board to L and R, however pt demonstrating difficulty with angle. Pt able to complete without SB and with mod A with bedpad to get over the wheel and prevent anterior LOB. Pt able to transfer back to the bed using the SB with min A with bedpad, however pt laying back onto bed before fully completing transfer due to fatigue        Balance Overall balance assessment: Needs assistance Sitting-balance support: Bilateral upper extremity supported Sitting balance-Leahy Scale: Poor Sitting balance - Comments: sitting EOB with intermittent posterior LOB requiring assist to correct. Increased instability with dynamic sitting balance                                    Cognition Arousal: Alert Behavior During Therapy: WFL for tasks assessed/performed Overall Cognitive Status: Within Functional Limits for tasks assessed                                          Exercises  General Comments        Pertinent Vitals/Pain Pain Assessment Pain Assessment: Faces Faces Pain Scale: Hurts little more Pain Location: R LE Pain Descriptors / Indicators: Guarding, Grimacing Pain Intervention(s): Monitored during session, Repositioned     PT Goals (current goals can now be found in the care plan section) Acute Rehab PT Goals Patient Stated Goal: rehab then home PT Goal Formulation: With patient Time For Goal Achievement: 10/12/23 Progress towards PT goals: Progressing toward goals    Frequency    Min 1X/week       AM-PAC PT "6 Clicks" Mobility    Outcome Measure  Help needed turning from your back to your side while in a flat bed without using bedrails?: A Little Help needed moving from lying on your back to sitting on the side of a flat bed without using bedrails?: A Little Help needed moving to and from a bed to a chair (including a wheelchair)?: A Lot Help needed standing up from a chair using your arms (e.g., wheelchair or bedside chair)?: Total Help needed to walk in hospital room?: Total Help needed climbing 3-5 steps with a railing? : Total 6 Click Score: 11    End of Session Equipment Utilized During Treatment:  (bedpads) Activity Tolerance: Patient tolerated treatment well Patient left: in bed;with call bell/phone within reach;with nursing/sitter in room Nurse Communication: Mobility status PT Visit Diagnosis: Other abnormalities of gait and mobility (R26.89);Pain Pain - Right/Left: Right Pain - part of body: Leg     Time: 4098-1191 PT Time Calculation (min) (ACUTE ONLY): 31 min  Charges:    $Therapeutic Activity: 23-37 mins PT General Charges $$ ACUTE PT VISIT: 1 Visit                     Johny Shock, PTA Acute Rehabilitation Services Secure Chat Preferred  [phone]    Johny Shock 10/04/2023, 9:46 AM

## 2023-10-04 NOTE — Assessment & Plan Note (Addendum)
Pain well-controlled on postop day 7, decreased oxycodone yesterday and patient reports no increase in pain from yesterday.  PT OT is working with him well awaiting placement in a SNF facility. - Tylenol 650 mg q6h scheduled  -Continue as needed oxycodone 5 mg every 12 hours by her to physical therapy -Gabapentin 600mg  3 times daily - on ASA, atorvastatin for PAD -Continue MiraLAX to twice a day, continue senna

## 2023-10-04 NOTE — Assessment & Plan Note (Signed)
>>  ASSESSMENT AND PLAN FOR ANEMIA WRITTEN ON 10/04/2023 12:25 PM BY Gerrit Heck, DO  Post surgical anemia.  Hemoglobin remains stable at 7.6 patient is asymptomatic -continue ferrous sulfate supplement

## 2023-10-04 NOTE — Assessment & Plan Note (Signed)
Post surgical anemia.  Hemoglobin remains stable at 7.6 patient is asymptomatic- -continue ferrous sulfate supplement

## 2023-10-04 NOTE — Assessment & Plan Note (Signed)
Glucoses have been stable in hospital. - metformin 1000 mg twice daily

## 2023-10-04 NOTE — Discharge Instructions (Addendum)
Dear [name],  Thank you for letting us participate in your care. You were hospitalized for gangrene of the right toe and had an above knee amputation.  You were treated with antibiotics and physical therapy with improvement.   POST-HOSPITAL & CARE INSTRUCTIONS Keep the staples in for 3-4 weeks and follow up with the vascular surgery folks to get these removed. We have sent in a referral for home health physical therapy to help you continue to get stronger. You can take miralax and senna for constipation. You can get these from the pharmacy over the counter. Go to your follow up appointments (listed below)  DOCTOR'S APPOINTMENT   Future Appointments  Date Time Provider Department Center  10/31/2023 12:45 PM [facility] PA[facility]  03/04/2024 11:00 AM [facility] ANNUAL WELLNESS VISIT [facility]    Follow-up Information     [facility] Follow up in 5 week(s).   Specialty: Vascular Surgery Why: Office will call you to arrange your appt (sent). Contact information: [address] [place] [address] [phone]        Erick Alley, DO. Schedule an appointment as soon as possible for a visit.   Specialty: Family Medicine Why: Make an appointment for hospital follow up ASAP. Contact information: [address] [place] [address] [phone]                 Take care and be well!  Family Medicine Teaching Service Inpatient Team [place]  [facility]  [address] [place], [place] [address] [phone]

## 2023-10-04 NOTE — Progress Notes (Signed)
Occupational Therapy Treatment Patient Details Name: [name] MRN: [id number] DOB: 07/20/1951 Today's Date: 10/04/2023   History of present illness Patient is a 72 y/o male presenting with gangrene of R toes. Underwent LE angiogram on 9/30 though revascularization options limited. R AKA 10/2. PMH: PAD, tobacco use, prior L AKA, DM, diabetic neuropathy, anemia, HTN.   OT comments  OT educated pt in B UE therapeutic activities sitting EOB to increase B UE and core strength, balance, B shoulder ROM, and activity tolerance for carryover to functional tasks. OT also educated pt in techniques for increased safety and independence with bed mobility with pt demonstrating ability to transfer supine<->sit and perform lateral scoot at EOB with Contact guard assist. Pt participated well in session and is making progress toward goals. Pt will benefit from continued acute skilled OT services to address deficits outlined below and increase safety and independence with functional tasks. Pt's insurance has denied CIR. Due to insurance denial, discharge recommendation updated this day. Post acute discharge, pt will benefit from intensive inpatient skilled rehab services < 3 hours per day to maximize rehab potential.       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 Pt demonstrated ability to perform lateral scoot at EOB toward pt's Right side toward [facility] with Contact guard assist, increased time, and cues for technique.     Balance Overall balance assessment: Needs assistance Sitting-balance support: Bilateral upper extremity supported Sitting balance-Leahy Scale: Fair Sitting balance - Comments: sitting EOB during therapeutic tasks                                   ADL either performed or assessed with clinical judgement   ADL Overall ADL's : Needs assistance/impaired                                       General ADL Comments: This session focused on B UE therapeutic exercise for increased strength, ROM, and activity tolerance for carryover to ADLs and functional transfers.    Extremity/Trunk Assessment Upper Extremity Assessment Upper Extremity Assessment: Right hand dominant;Overall WFL for tasks assessed;RUE deficits/detail;LUE deficits/detail RUE Deficits / Details: strength and coordination overall WFL but with pt fatiguing quickly; decreased shoulder AROM at baseline due to prior rotator cuff injury LUE Deficits / Details: strength and coordinaiton overall WFL but  with pt fatiguing quickly; decreased shoulder AROM at baseline due to prior rotator cuff injury   Lower Extremity Assessment Lower Extremity Assessment: Defer to PT evaluation        Vision       Perception     Praxis      Cognition Arousal: Alert Behavior During Therapy: [facility] for tasks assessed/performed Overall Cognitive Status: Within Functional Limits for tasks assessed                                  General Comments: AAOx4 and pleasant throughout session. Able to follow multi-step commands consistently.        Exercises Exercises: General Upper Extremity General Exercises - Upper Extremity Shoulder Flexion: AROM, Both, Strengthening, Other reps (comment), Seated (sitting EOB; 2 sets of 10 reps; increased activity tolerance; increased ROM) Shoulder Extension: AROM, Both, Strengthening, Other reps (comment), Seated (sitting EOB; 2 sets of 10 reps; increased activity tolerance; increased ROM) Shoulder ABduction: AROM, Both, Strengthening, Seated, Other reps (comment) (sitting EOB; 2 sets of 10 reps; increased activity tolerance; increased ROM) Shoulder ADduction: AROM, Both, Strengthening, Other reps (comment), Seated (sitting EOB; 2 sets of 10 reps; increased activity tolerance; increased ROM) Elbow Flexion: AROM, Both, Other reps (comment), Strengthening, Seated (sitting EOB; 2 sets of 10 reps; increased activity tolerance) Elbow Extension: AROM, Both, Strengthening, Other reps (comment), Seated (sitting EOB; 2 sets of 10 reps; increased activity tolerance) Wrist Flexion: AROM, Both, Strengthening, Other reps (comment), Seated (sitting EOB; 2 sets of 10 reps; increased activity tolerance) Wrist Extension: AROM, Both, Strengthening, Other reps (comment), Seated (sitting EOB; 2 sets of 10 reps; increased activity tolerance) Digit Composite Flexion: AROM, Both, Strengthening, Other reps (comment), Seated (sitting EOB; 2 sets of 10 reps; increased activity tolerance) Composite Extension: AROM, Both, Strengthening, Seated (sitting EOB; 2 sets of 10 reps; increased activity tolerance)    Shoulder Instructions       General Comments VSS on RA throughout session    Pertinent Vitals/ Pain       Pain Assessment Pain Assessment: Faces Faces Pain Scale: Hurts little more Pain Location: R LE Pain Descriptors / Indicators: Guarding, Grimacing Pain  Intervention(s): Limited activity within patient's tolerance, Monitored during session, Repositioned  Home Living                                          Prior Functioning/Environment              Frequency  Min 1X/week        Progress Toward Goals  OT Goals(current goals can now be found in the care plan section)  Progress towards OT goals: Progressing toward goals  Acute Rehab OT Goals Patient Stated Goal: To continue to get stronger and be able to do more for himself so he can return home  Plan      Co-evaluation                 AM-PAC OT "6 Clicks" Daily Activity     Outcome Measure   Help from another person eating meals?: None Help from another person taking care of personal grooming?: A Little Help from another person toileting, which includes using toliet, bedpan, or urinal?: A Lot Help from another person bathing (including washing, rinsing, drying)?: A Lot Help from another person to put  on and taking off regular upper body clothing?: None Help from another person to put on and taking off regular lower body clothing?: A Lot 6 Click Score: 17    End of Session    OT Visit Diagnosis: Other abnormalities of gait and mobility (R26.89);Other (comment) (Decreased activity tolerance)   Activity Tolerance Patient tolerated treatment well   Patient Left in bed;with call bell/phone within reach;with bed alarm set   Nurse Communication Mobility status;Other (comment) (Pain level)        Time: 8841-6606 OT Time Calculation (min): 32 min  Charges: OT General Charges $OT Visit: 1 Visit OT Treatments $Therapeutic Activity: 8-22 mins $Therapeutic Exercise: 8-22 mins  Helder Crisafulli "Orson Eva., OTR/L, MA Acute Rehab [phone]   Lendon Colonel 10/04/2023, 6:32 PM

## 2023-10-04 NOTE — TOC Progression Note (Addendum)
Transition of Care [facility]) - Progression Note    Patient Details  Name: [name] MRN: [id number] Date of Birth: [birth year]  [facility] CM/SW Contact  Eduard Roux, [place] Phone Number: 10/04/2023, 11:22 AM  Clinical Narrative:     PT worked with patient this am- CSW received message from PT, he is agreeable to SNF. CSW sent out SNF referrals.  TOC will provide bed offers once available.   Antony Blackbird, MSW, LCSW Clinical Social Worker    Expected Discharge Plan: Skilled Nursing Facility Barriers to Discharge: Continued Medical Work up  Expected Discharge Plan and Services   Discharge Planning Services: CM Consult Post Acute Care Choice: Durable Medical Equipment, Home Health Living arrangements for the past 2 months: Single Family Home                 DME Arranged: Other see comment (slide board) DME Agency: [employer]                   Social Determinants of Health (SDOH) Interventions SDOH Screenings   Food Insecurity: No Food Insecurity (09/21/2023)  Housing: Low Risk  (09/21/2023)  Transportation Needs: No Transportation Needs (09/21/2023)  Recent Concern: Transportation Needs - Unmet Transportation Needs (08/09/2023)  Utilities: Not At Risk (09/21/2023)  Recent Concern: Utilities - At Risk (08/09/2023)  Alcohol Screen: Low Risk  (02/10/2023)  Depression (PHQ2-9): Low Risk  (06/14/2023)  Financial Resource Strain: Low Risk  (02/10/2023)  Physical Activity: Insufficiently Active (02/10/2023)  Social Connections: Moderately Isolated (02/10/2023)  Stress: No Stress Concern Present (02/10/2023)  Tobacco Use: High Risk (09/27/2023)    Readmission Risk Interventions    08/17/2023    9:33 AM 02/22/2021   12:24 PM  Readmission Risk Prevention Plan  Post Dischage Appt --   Medication Screening Complete   Transportation Screening Complete Complete  PCP or Specialist Appt within 5-7 Days  Complete  Home Care Screening  Complete  Medication  Review (RN CM)  Complete

## 2023-10-05 DIAGNOSIS — Z7984 Long term (current) use of oral hypoglycemic drugs: Secondary | ICD-10-CM | POA: Diagnosis not present

## 2023-10-05 DIAGNOSIS — I70209 Unspecified atherosclerosis of native arteries of extremities, unspecified extremity: Secondary | ICD-10-CM

## 2023-10-05 DIAGNOSIS — Z89611 Acquired absence of right leg above knee: Secondary | ICD-10-CM | POA: Diagnosis not present

## 2023-10-05 NOTE — Assessment & Plan Note (Signed)
Pain well-controlled on postop day 8. - Tylenol 650 mg q6h scheduled  -Continue as needed oxycodone 5 mg every 12 hours by her to physical therapy -Gabapentin 600mg  3 times daily - on ASA, atorvastatin for PAD -Continue MiraLAX to twice a day

## 2023-10-05 NOTE — Assessment & Plan Note (Signed)
Glucoses have been stable in hospital. - metformin 1000 mg twice daily

## 2023-10-05 NOTE — Progress Notes (Addendum)
Mobility Specialist Progress Note:   10/05/23 1314  Mobility  Activity Transferred from bed to chair  Level of Assistance Minimal assist, patient does 75% or more  Assistive Device Sliding board  RLE Weight Bearing NWB  Activity Response Tolerated well  Mobility Referral Yes  $Mobility charge 1 Mobility  Mobility Specialist Start Time (ACUTE ONLY) 1155  Mobility Specialist Stop Time (ACUTE ONLY) 1210  Mobility Specialist Time Calculation (min) (ACUTE ONLY) 15 min    Pt received in bed, agreeable to transfer to chair. While transitioning to EOB pt c/o slight dizziness requiring rest break. Rated dizziness 4/10. Pt able to lateral scoot to chair with sliding board with minA for direction. Pt needing slight verbal cues with hand placement for support and showed steady balance during transition. Pt left in chair with call bell in reach and all needs met.    Leory Plowman  Mobility Specialist Please contact via [employer] office at [phone]

## 2023-10-05 NOTE — Plan of Care (Signed)
  Problem: Clinical Measurements: Goal: Postoperative complications will be avoided or minimized Outcome: Progressing   

## 2023-10-05 NOTE — Assessment & Plan Note (Signed)
>>  ASSESSMENT AND PLAN FOR ANEMIA WRITTEN ON 10/05/2023 11:26 AM BY MILLER, SAMANTHA, DO  Post surgical anemia.  Hemoglobin remains stable at 9.1 patient is asymptomatic -continue ferrous sulfate supplement

## 2023-10-05 NOTE — Progress Notes (Signed)
Physical Therapy Treatment Patient Details Name: [name] MRN: [id number] DOB: [birth year] Today's Date: 10/05/2023   History of Present Illness Patient is a 72 y/o male presenting with gangrene of R toes. Underwent LE angiogram on 9/30 though revascularization options limited. R AKA 10/2. PMH: PAD, tobacco use, prior L AKA, DM, diabetic neuropathy, anemia, HTN.    PT Comments  Pt received sitting in the recliner and requesting to return to bed. Pt demonstrating improved SB set up with assist. Pt able to lateral transfer with CGA and cues for technique. Pt requires increased time due to weakness and pain, however demonstrates improved balance without anterior LOB during transfer. Pt with posterior LOB back to supine once returned to bed. Pt continues to benefit from PT services to progress toward functional mobility goals.    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) Restrictions Weight Bearing Restrictions: Yes RLE Weight Bearing: Non weight bearing     Mobility  Bed Mobility Overal bed mobility: Needs Assistance Bed Mobility: Sit to Supine     Supine to sit: Contact guard, HOB elevated     General bed mobility comments: Uncontrolled descent due to posterior LOB once transferred back to bed, but pt able to reposition without assist    Transfers Overall transfer level: Needs assistance Equipment used: Sliding board Transfers: Bed to chair/wheelchair/BSC            Lateral/Scoot Transfers: Contact guard assist General transfer comment: increased time and pt performing small scoots, but no  physical assist needed. CGA for safety and cues for technique        Balance Overall balance assessment: Needs assistance Sitting-balance support: Bilateral upper extremity supported Sitting balance-Leahy Scale: Fair                                      Cognition Arousal: Alert Behavior During Therapy: WFL for tasks assessed/performed Overall Cognitive Status: Within Functional Limits for tasks assessed                                          Exercises      General Comments        Pertinent Vitals/Pain Pain Assessment Pain Assessment: Faces Faces Pain Scale: Hurts little more Pain Location: R LE Pain Descriptors / Indicators: Guarding, Grimacing, Sharp Pain Intervention(s): Monitored during session, Repositioned     PT Goals (current goals can now be found in the care plan section) Acute Rehab PT Goals Patient Stated Goal: rehab then home PT Goal Formulation: With patient Time For Goal Achievement: 10/12/23 Progress towards PT goals: Progressing toward goals    Frequency    Min 1X/week       AM-PAC PT "6 Clicks" Mobility   Outcome Measure  Help needed turning from your back to your side while in a flat bed without using bedrails?: A Little Help needed moving from lying on your back to sitting on the side of a flat bed  without using bedrails?: A Little Help needed moving to and from a bed to a chair (including a wheelchair)?: A Little Help needed standing up from a chair using your arms (e.g., wheelchair or bedside chair)?: Total Help needed to walk in hospital room?: Total Help needed climbing 3-5 steps with a railing? : Total 6 Click Score: 12    End of Session   Activity Tolerance: Patient tolerated treatment well Patient left: in bed;with call bell/phone within reach;with nursing/sitter in room Nurse Communication: Mobility status PT Visit Diagnosis: Other abnormalities of gait and mobility (R26.89);Pain Pain -  Right/Left: Right Pain - part of body: Leg     Time: 1324-4010 PT Time Calculation (min) (ACUTE ONLY): 8 min  Charges:    $Therapeutic Activity: 8-22 mins PT General Charges $$ ACUTE PT VISIT: 1 Visit                    Johny Shock, PTA Acute Rehabilitation Services Secure Chat Preferred  [phone]    Johny Shock 10/05/2023, 2:41 PM

## 2023-10-05 NOTE — Progress Notes (Signed)
Nutrition Follow-up  DOCUMENTATION CODES:   Not applicable  INTERVENTION:  Continue to encourage adequate PO intake with emphasis on protein to promote wound healing Ensure Enlive po BID, each supplement provides 350 kcal and 20 grams of protein. Discontinue Juven  NUTRITION DIAGNOSIS:   Increased nutrient needs related to wound healing as evidenced by estimated needs. - remains applicable  GOAL:   Patient will meet greater than or equal to 90% of their needs - progressing, addressing via meals and nutrition supplements  MONITOR:   PO intake, Supplement acceptance, Labs, Weight trends, Skin  REASON FOR ASSESSMENT:   Malnutrition Screening Tool    ASSESSMENT:   [name] admitted with gangrene of R toes. PMH significant for PAD and DM neuropathy.  9/30: s/p RLE angiogram  10/2: s/p  R AKA  Medically stable for d/c, pending SNF placement.   [name] receiving bath at time of visit. Spoke with RN who reports [name] is eating well and drinking most Ensure. He does not like Juven supplements, will discontinue these. Plan to continue with current nutrition interventions and follow up as appropriate.   Meal completions: 10/3: 50% breakfast 10/4: 50% breakfast, 75% lunch 10/5: 95% breakfast 10/6: 50% lunch 10/8: 25% breakfast  No updated weight on file since 10/2 to assess for changes.   Medications: ferrous sulfate, metformin, protonix, miralax  Labs: sodium 134   Diet Order:   Diet Order             Diet Carb Modified Fluid consistency: Thin; Room service appropriate? Yes  Diet effective now                   EDUCATION NEEDS:   Education needs have been addressed  Skin:  Skin Assessment: Skin Integrity Issues: Skin Integrity Issues:: Other (Comment) Other: open R toe wound/gangrene  Last BM:  10/8  Height:   Ht Readings from Last 1 Encounters:  09/27/23 5\' 7"  (1.702 m)    Weight:   Wt Readings from Last 1 Encounters:  09/27/23 82 kg   BMI:  Body mass  index is 28.31 kg/m.  Estimated Nutritional Needs:   Kcal:  2000-2200  Protein:  110-125g  Fluid:  >/=2L  Drusilla Kanner, RDN, LDN Clinical Nutrition

## 2023-10-05 NOTE — TOC Progression Note (Signed)
Transition of Care [facility]) - Progression Note    Patient Details  Name: [name] MRN: [id number] Date of Birth: 1951-06-08  Transition of Care [facility]) CM/SW Contact  Eduard Roux, [place] Phone Number: 10/05/2023, 1:51 PM  Clinical Narrative:     CSW met with patient. CSW introduced self and explained role. Patient states he remains agreeable to short term rehab at [facility]. Patient wants [facility].  [name] confirmed bed offer, TOC will start insurance authorization.  TOC will continue to follow and assist with discharge planning.   Antony Blackbird, MSW, LCSW Clinical Social Worker     Expected Discharge Plan: Skilled Nursing Facility Barriers to Discharge: Continued Medical Work up  Expected Discharge Plan and Services   Discharge Planning Services: CM Consult Post Acute Care Choice: Durable Medical Equipment, Home Health Living arrangements for the past 2 months: Single Family Home                 DME Arranged: Other see comment (slide board) DME Agency: [facility]                   Social Determinants of Health (SDOH) Interventions SDOH Screenings   Food Insecurity: No Food Insecurity (09/21/2023)  Housing: Low Risk  (09/21/2023)  Transportation Needs: No Transportation Needs (09/21/2023)  Recent Concern: Transportation Needs - Unmet Transportation Needs (08/09/2023)  Utilities: Not At Risk (09/21/2023)  Recent Concern: Utilities - At Risk (08/09/2023)  Alcohol Screen: Low Risk  (02/10/2023)  Depression (PHQ2-9): Low Risk  (06/14/2023)  Financial Resource Strain: Low Risk  (02/10/2023)  Physical Activity: Insufficiently Active (02/10/2023)  Social Connections: Moderately Isolated (02/10/2023)  Stress: No Stress Concern Present (02/10/2023)  Tobacco Use: High Risk (09/27/2023)    Readmission Risk Interventions    08/17/2023    9:33 AM 02/22/2021   12:24 PM  Readmission Risk Prevention Plan  Post Dischage Appt --   Medication Screening Complete    Transportation Screening Complete Complete  PCP or Specialist Appt within 5-7 Days  Complete  Home Care Screening  Complete  Medication Review (RN CM)  Complete

## 2023-10-05 NOTE — Progress Notes (Signed)
Daily Progress Note Intern Pager: [phone]  Patient name: [name] Medical record number: [id number] Date of birth: [birth year] Age: 72 y.o. Gender: male  Primary Care Provider: Erick Alley, DO Consultants: Vascular surgery Code Status: Full  Pt Overview and Major Events to Date:  Admitted: 9/26 Right-sided AKA: 10/2  Assessment and Plan:  [name] is a pleasant 72 year old man who presented with gangrene of the right second and third toes now with right sided above-the-knee amputation and a history of diabetes, hypertension, hyperlipidemia and neuropathy.  [name] is in stable condition and recovering well.  He is awaiting placement at a SNF. Assessment & Plan S/P AKA (above knee amputation), right (HCC) Pain well-controlled on postop day 8. - Tylenol 650 mg q6h scheduled  -Continue as needed oxycodone 5 mg every 12 hours by her to physical therapy -Gabapentin 600mg  3 times daily - on ASA, atorvastatin for PAD -Continue MiraLAX to twice a day Anemia Post surgical anemia.  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 - metformin 1000 mg twice daily   Chronic and Stable Problems:  Hypertension: May resume home medications at discharge Hyperlipidemia: Continued as above Neuropathy: Gabapentin as above Mood: Continue home fluoxetine Reflux: Continue home Protonix   FEN/GI: Carb modified PPx: Heparin Dispo:SNF  pending placement decision .   Subjective:  [name] was sitting up resting comfortably in bed when I examined him this morning.  He reports no pains or concerns today and is looking forward to leaving the hospital.  Objective: Temp:  [98.2 F (36.8 C)-98.9 F (37.2 C)] 98.2 F (36.8 C) (10/10 0820) Pulse Rate:  [88-98] 88 (10/10 0820) Resp:  [16-19] 16 (10/10 0820) BP:  (100-119)/(63-86) 106/70 (10/10 0820) SpO2:  [95 %-98 %] 98 % (10/10 0820) Physical Exam: General: Alert, oriented no obvious distress Cardiovascular: RRR, no M/R/G Respiratory: CTAB no increased work of breathing Abdomen: Flat soft nontender Extremities: 2+ pulses in the bilateral upper extremity.  Surgical incision of the right limb stump clean and well-maintained.  Surgical staples in place.  No signs of drainage swelling redness or infection.  Laboratory: Most recent CBC Lab Results  Component Value Date   WBC 10.0 10/04/2023   HGB 9.1 (L) 10/04/2023   HCT 31.3 (L) 10/04/2023   MCV 73.6 (L) 10/04/2023   PLT 508 (H) 10/04/2023   Most recent BMP    Latest Ref Rng & Units 09/29/2023    4:33 AM  BMP  Glucose 70 - 99 mg/dL 130   BUN 8 - 23 mg/dL 13   Creatinine 8.65 - 1.24 mg/dL 7.84   Sodium 696 - 295 mmol/L 134   Potassium 3.5 - 5.1 mmol/L 3.9   Chloride 98 - 111 mmol/L 96   CO2 22 - 32 mmol/L 29   Calcium 8.9 - 10.3 mg/dL 8.9     Gerrit Heck, DO 10/05/2023, 11:20 AM  PGY-1, [facility] FPTS Intern pager: [phone], text pages welcome Secure chat group [facility] Teaching Service

## 2023-10-05 NOTE — Assessment & Plan Note (Signed)
Post surgical anemia.  Hemoglobin remains stable at 9.1 patient is asymptomatic -continue ferrous sulfate supplement

## 2023-10-05 NOTE — Consult Note (Signed)
Value-Based Care Institute [VBCI]   [facility] Inpatient Consult   10/05/2023  MATTHEW CINA 04-10-1951 [id number]  [facility] [[facility]]  Accountable Care Organization [ACO] Patient: Monia Pouch Medicare   Primary Care Provider: Erick Alley, DO with [facility] health Family Medicine  this provider is listed for the transition of care follow up appointments and calls   [facility] Liaison reviewed patient for length of stay and with medium risk score for unplanned readmission risk 3 hospital admissions in 6 months. Patient has had outreaches from [facility] POP Health team regarding transportation and encouraged [employer] transportationas well however,  declined ongoing follow up noted.  The patient was assessed for potential needs for post hospital transition for care coordination. Review of patient's electronic medical record reveals patient is being recommended for a skilled nursing facility level of care post hospital for ST rehab..   Plan: [facility] Liaison will continue to follow progress and disposition to asess for post hospital community care coordination/management needs.  Referral request for community care coordination: pending disposition, if patient transitions to an affiliated facility will notify [facility] RN to follow for returning to community from SNF.   Community Care Management/Population Health does not replace or interfere with any arrangements made by the Inpatient Transition of Care team.   For questions contact:   Charlesetta Shanks, RN, BSN, CCM [facility], [facility] Liaison Direct Dial: [phone] or secure chat Website: [email or web address]

## 2023-10-06 DIAGNOSIS — I70209 Unspecified atherosclerosis of native arteries of extremities, unspecified extremity: Secondary | ICD-10-CM | POA: Diagnosis not present

## 2023-10-06 DIAGNOSIS — Z7984 Long term (current) use of oral hypoglycemic drugs: Secondary | ICD-10-CM | POA: Diagnosis not present

## 2023-10-06 DIAGNOSIS — Z89611 Acquired absence of right leg above knee: Secondary | ICD-10-CM | POA: Diagnosis not present

## 2023-10-06 MED ORDER — SENNA 8.6 MG PO TABS
1.0000 | ORAL_TABLET | Freq: Every day | ORAL | Status: DC
  Administered 2023-10-07: 8.6 mg via ORAL
  Filled 2023-10-06 (×2): qty 1

## 2023-10-06 MED ORDER — MIRTAZAPINE 15 MG PO TABS
7.5000 mg | ORAL_TABLET | Freq: Every day | ORAL | Status: DC
  Administered 2023-10-06 – 2023-10-07 (×2): 7.5 mg via ORAL
  Filled 2023-10-06 (×2): qty 1

## 2023-10-06 MED ORDER — OXYCODONE HCL 5 MG PO TABS
2.5000 mg | ORAL_TABLET | Freq: Once | ORAL | Status: AC | PRN
  Administered 2023-10-06: 2.5 mg via ORAL
  Filled 2023-10-06: qty 1

## 2023-10-06 MED ORDER — OXYCODONE HCL 5 MG PO TABS
2.5000 mg | ORAL_TABLET | Freq: Once | ORAL | Status: AC
  Administered 2023-10-06: 2.5 mg via ORAL
  Filled 2023-10-06: qty 1

## 2023-10-06 NOTE — Plan of Care (Signed)
  Problem: Education: Goal: Knowledge of prescribed regimen will improve Outcome: Progressing   Problem: Bowel/Gastric: Goal: Gastrointestinal status for postoperative course will improve Outcome: Progressing   Problem: Coping: Goal: Ability to adjust to condition or change in health will improve Outcome: Progressing   Problem: Nutritional: Goal: Maintenance of adequate nutrition will improve Outcome: Progressing   Problem: Skin Integrity: Goal: Risk for impaired skin integrity will decrease Outcome: Progressing   Problem: Education: Goal: Knowledge of General Education information will improve Description: Including pain rating scale, medication(s)/side effects and non-pharmacologic comfort measures Outcome: Progressing   Problem: Coping: Goal: Level of anxiety will decrease Outcome: Progressing   Problem: Pain Managment: Goal: General experience of comfort will improve Outcome: Progressing   Problem: Skin Integrity: Goal: Risk for impaired skin integrity will decrease Outcome: Progressing

## 2023-10-06 NOTE — TOC Progression Note (Signed)
Transition of Care [facility]) - Progression Note    Patient Details  Name: [name] MRN: [id number] Date of Birth: [birth year]  Transition [facility]) CM/SW Contact  Eduard Roux, [place] Phone Number: 10/06/2023, 10:41 AM  Clinical Narrative:     Insurance authorization still pending   Expected Discharge Plan: Skilled Nursing Facility Barriers to Discharge: Continued Medical Work up  Expected Discharge Plan and Services   Discharge Planning Services: CM Consult Post Acute Care Choice: Durable Medical Equipment, Home Health Living arrangements for the past 2 months: Single Family Home                 DME Arranged: Other see comment (slide board) DME Agency: [employer]                   Social Determinants of Health (SDOH) Interventions SDOH Screenings   Food Insecurity: No Food Insecurity (09/21/2023)  Housing: Low Risk  (09/21/2023)  Transportation Needs: No Transportation Needs (09/21/2023)  Recent Concern: Transportation Needs - Unmet Transportation Needs (08/09/2023)  Utilities: Not At Risk (09/21/2023)  Recent Concern: Utilities - At Risk (08/09/2023)  Alcohol Screen: Low Risk  (02/10/2023)  Depression (PHQ2-9): Low Risk  (06/14/2023)  Financial Resource Strain: Low Risk  (02/10/2023)  Physical Activity: Insufficiently Active (02/10/2023)  Social Connections: Moderately Isolated (02/10/2023)  Stress: No Stress Concern Present (02/10/2023)  Tobacco Use: High Risk (09/27/2023)    Readmission Risk Interventions    08/17/2023    9:33 AM 02/22/2021   12:24 PM  Readmission Risk Prevention Plan  Post Dischage Appt --   Medication Screening Complete   Transportation Screening Complete Complete  PCP or Specialist Appt within 5-7 Days  Complete  Home Care Screening  Complete  Medication Review (RN CM)  Complete

## 2023-10-06 NOTE — Assessment & Plan Note (Signed)
Glucoses have been stable in hospital. - metformin 1000 mg twice daily

## 2023-10-06 NOTE — Progress Notes (Signed)
Mobility Specialist Progress Note:   10/06/23 1238  Mobility  Activity Transferred from bed to chair  Level of Assistance Minimal assist, patient does 75% or more  Assistive Device Sliding board  RLE Weight Bearing NWB  Activity Response Tolerated well  Mobility Referral Yes  $Mobility charge 1 Mobility  Mobility Specialist Start Time (ACUTE ONLY) 1220  Mobility Specialist Stop Time (ACUTE ONLY) 1235  Mobility Specialist Time Calculation (min) (ACUTE ONLY) 15 min    Pt received in bed, ready to transfer to chair. C/o random sharp pain in RLE during transition, otherwise asymptomatic throughout. Pt needing minA for balance and directional cues during lateral scoot to chair. Pt left in chair with call bell in reach and all needs met.   Leory Plowman  Mobility Specialist Please contact via [employer] office at [phone]

## 2023-10-06 NOTE — TOC Progression Note (Signed)
Transition of Care [facility]) - Progression Note    Patient Details  Name: [name] MRN: [id number] Date of Birth: 03-31-1951  Transition of Care [facility]) CM/SW Contact  Eduard Roux, [place] Phone Number: 10/06/2023, 3:43 PM  Clinical Narrative:     [name]/SNF informed received authorization, anticipated d/c tomorrow.  TOC will continue to follow and assist with discharge planning.  Antony Blackbird, MSW, LCSW Clinical Social Worker    Expected Discharge Plan: Skilled Nursing Facility Barriers to Discharge: Continued Medical Work up  Expected Discharge Plan and Services   Discharge Planning Services: CM Consult Post Acute Care Choice: Durable Medical Equipment, Home Health Living arrangements for the past 2 months: Single Family Home                 DME Arranged: Other see comment (slide board) DME Agency: [employer]                   Social Determinants of Health (SDOH) Interventions SDOH Screenings   Food Insecurity: No Food Insecurity (09/21/2023)  Housing: Low Risk  (09/21/2023)  Transportation Needs: No Transportation Needs (09/21/2023)  Recent Concern: Transportation Needs - Unmet Transportation Needs (08/09/2023)  Utilities: Not At Risk (09/21/2023)  Recent Concern: Utilities - At Risk (08/09/2023)  Alcohol Screen: Low Risk  (02/10/2023)  Depression (PHQ2-9): Low Risk  (06/14/2023)  Financial Resource Strain: Low Risk  (02/10/2023)  Physical Activity: Insufficiently Active (02/10/2023)  Social Connections: Moderately Isolated (02/10/2023)  Stress: No Stress Concern Present (02/10/2023)  Tobacco Use: High Risk (09/27/2023)    Readmission Risk Interventions    08/17/2023    9:33 AM 02/22/2021   12:24 PM  Readmission Risk Prevention Plan  Post Dischage Appt --   Medication Screening Complete   Transportation Screening Complete Complete  PCP or Specialist Appt within 5-7 Days  Complete  Home Care Screening  Complete  Medication Review (RN CM)   Complete

## 2023-10-06 NOTE — Assessment & Plan Note (Signed)
Post surgical anemia.  Hemoglobin remains stable at 9.1 patient is asymptomatic -continue ferrous sulfate supplement

## 2023-10-06 NOTE — Progress Notes (Signed)
Physical Therapy Treatment Patient Details Name: [name] MRN: [id number] DOB: 1951-01-01 Today's Date: 10/06/2023   History of Present Illness Patient is a 72 y/o male presenting with gangrene of R toes. Underwent LE angiogram on 9/30 though revascularization options limited. R AKA 10/2. PMH: PAD, tobacco use, prior L AKA, DM, diabetic neuropathy, anemia, HTN.    PT Comments  Pt received in recliner and requesting to return to bed due to increased pain. Pt able to set up SB with cues and perform small scoots with CGA for safety. When sitting EOB completing transfer, pt demonstrates a posterior LOB back into supine. Pt is able to reposition in bed with increased time, but no assist. Education provided on anterior weight shift during lateral transfer to prevent posterior LOB. Further mobility deferred due to increased pain. Pt continues to benefit from PT services to progress toward functional mobility goals.    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) Restrictions Weight Bearing Restrictions: Yes RLE Weight Bearing: Non weight bearing     Mobility  Bed Mobility Overal bed mobility: Needs Assistance Bed Mobility: Sit to Supine     Supine to sit: Contact guard     General bed mobility comments: Uncontrolled descent due to posterior LOB once transferred back to bed, but pt able to reposition without assist    Transfers Overall transfer level: Needs assistance Equipment used: Sliding board Transfers: Bed to chair/wheelchair/BSC            Lateral/Scoot Transfers: Contact guard assist General  transfer comment: increased time and pt performing small scoots, but no physical assist needed. CGA for safety and cues for technique. Pt with posterior LOB into bed before fully completing transfer      Balance Overall balance assessment: Needs assistance Sitting-balance support: Bilateral upper extremity supported Sitting balance-Leahy Scale: Fair Sitting balance - Comments: sitting EOB. Posterior bias during transfers                                    Cognition Arousal: Alert Behavior During Therapy: WFL for tasks assessed/performed Overall Cognitive Status: Within Functional Limits for tasks assessed                                          Exercises      General Comments        Pertinent Vitals/Pain Pain Assessment Pain Assessment: Faces Faces Pain Scale: Hurts whole lot Pain Location: R LE, stomach Pain Descriptors / Indicators: Guarding, Grimacing, Sharp Pain Intervention(s): Limited activity within patient's tolerance, Monitored during session, Repositioned     PT Goals (current goals can now be found in the care plan section) Acute Rehab PT Goals Patient Stated Goal: rehab then home PT Goal Formulation: With patient Time For Goal Achievement: 10/12/23 Progress towards PT goals: Progressing toward goals    Frequency    Min 1X/week       AM-PAC PT "6 Clicks" Mobility   Outcome Measure  Help needed turning from your back to your side while in a flat bed without using bedrails?: A Little Help needed moving from lying on your back to sitting on the side of a flat bed without using bedrails?: A Little Help needed moving to and from a bed to a chair (including a wheelchair)?: A Little Help needed standing up from a chair using your arms (e.g., wheelchair or bedside chair)?: Total Help needed to walk in hospital room?: Total Help needed climbing 3-5 steps with a railing? : Total 6 Click Score: 12    End of Session    Activity Tolerance: Patient limited by pain Patient left: in bed;with call bell/phone within reach Nurse Communication: Mobility status PT Visit Diagnosis: Other abnormalities of gait and mobility (R26.89);Pain Pain - Right/Left: Right Pain - part of body: Leg     Time: 1435-1447 PT Time Calculation (min) (ACUTE ONLY): 12 min  Charges:    $Therapeutic Activity: 8-22 mins PT General Charges $$ ACUTE PT VISIT: 1 Visit                     [name], PTA Acute Rehabilitation Services Secure Chat Preferred  [phone]    [name] 10/06/2023, 2:59 PM

## 2023-10-06 NOTE — Assessment & Plan Note (Signed)
Pain well-controlled on postop day 9, some intermittent shooting pain which could be explained by phantom limb syndrome.  Recommended to the patient that he should use a mirror to look at his limb and remind himself that it is gone continue with current pain regimen.  He is also having some constipation we will restart senna. - Tylenol 650 mg q6h scheduled  -Continue as needed oxycodone 5 mg every 12 hours by her to physical therapy -Gabapentin 600mg  3 times daily - on ASA, atorvastatin for PAD -Continue MiraLAX to twice a day

## 2023-10-06 NOTE — Assessment & Plan Note (Signed)
>>  ASSESSMENT AND PLAN FOR ANEMIA WRITTEN ON 10/06/2023  1:48 PM BY MILLER, SAMANTHA, DO  Post surgical anemia.  Hemoglobin remains stable at 9.1 patient is asymptomatic -continue ferrous sulfate supplement

## 2023-10-06 NOTE — Progress Notes (Signed)
Mobility Specialist Progress Note:   10/06/23 1156  Mobility  Activity Turned to left side  Level of Assistance Standby assist, set-up cues, supervision of patient - no hands on  Assistive Device  (bedrails)  RLE Weight Bearing NWB  Activity Response Tolerated well  Mobility Referral Yes  $Mobility charge 1 Mobility  Mobility Specialist Start Time (ACUTE ONLY) 1145  Mobility Specialist Stop Time (ACUTE ONLY) 1152  Mobility Specialist Time Calculation (min) (ACUTE ONLY) 7 min   Pt received in bed, agreeable to transfer to chair. Requested to use bed pan before mobility. Pt left on bed pan with call bell in reach and all needs met. NT notified.  Leory Plowman  Mobility Specialist Please contact via [employer] office at [phone]

## 2023-10-06 NOTE — Progress Notes (Signed)
Daily Progress Note Intern Pager: [phone]  Patient name: [name] Medical record number: [id number] Date of birth: 03-26-1951 Age: 72 y.o. Gender: male  Primary Care Provider: Erick Alley, DO Consultants: Vascular surgery Code Status: Full  Pt Overview and Major Events to Date:  Admitted: 9/26 AKA, right side: 10/2  Assessment and Plan:  [name] is a 72 year old man who presents with gangrene of the right second and third toes now status post right sided above-the-knee amputation with a history of diabetes, hypertension, hyperlipidemia, neuropathy and depression.  [name] is in stable condition and recovering well.  He is awaiting placement at a SNF Assessment & Plan S/P AKA (above knee amputation), right (HCC) Pain well-controlled on postop day 9, some intermittent shooting pain which could be explained by phantom limb syndrome.  Recommended to the patient that he should use a mirror to look at his limb and remind himself that it is gone continue with current pain regimen.  He is also having some constipation we will restart senna. - Tylenol 650 mg q6h scheduled  -Continue as needed oxycodone 5 mg every 12 hours by her to physical therapy -Gabapentin 600mg  3 times daily - on ASA, atorvastatin for PAD -Continue MiraLAX to twice a day Anemia Post surgical anemia.  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 - metformin 1000 mg twice daily   Chronic and Stable Problems:  Hypertension: Stable continue to hold home meds Hyperlipidemia: Continued as above Neuropathy: Gabapentin as above Mood: Continue home fluoxetine Reflux: Continue home Protonix   FEN/GI: Mechanically softened PPx: Heparin Dispo:SNF  insurance authorization .   Subjective:  [name] was in some mild discomfort on exam this  morning he was complaining of some shooting pains intermittently in his recently amputated leg.  He requested some additional therapies for pain control but after discussion with the team we elected to go with more conservative measures.  Objective: Temp:  [97.6 F (36.4 C)-98.4 F (36.9 C)] 97.6 F (36.4 C) (10/11 0736) Pulse Rate:  [81-100] 100 (10/11 1156) Resp:  [17-18] 18 (10/11 1156) BP: (99-122)/(69-88) 110/88 (10/11 1156) SpO2:  [95 %-99 %] 95 % (10/11 1156) Physical Exam: General: Alert, oriented, no obvious distress. Cardiovascular: RRR, no M/R/G Respiratory: CTAB, no increased work of breathing Abdomen: Flat, soft, nontender Extremities: Right surgical stump healing well, surgical sutures in place no signs of erythema swelling or infection.  Laboratory: Most recent CBC Lab Results  Component Value Date   WBC 10.0 10/04/2023   HGB 9.1 (L) 10/04/2023   HCT 31.3 (L) 10/04/2023   MCV 73.6 (L) 10/04/2023   PLT 508 (H) 10/04/2023   Most recent BMP    Latest Ref Rng & Units 09/29/2023    4:33 AM  BMP  Glucose 70 - 99 mg/dL 147   BUN 8 - 23 mg/dL 13   Creatinine 8.29 - 1.24 mg/dL 5.62   Sodium 130 - 865 mmol/L 134   Potassium 3.5 - 5.1 mmol/L 3.9   Chloride 98 - 111 mmol/L 96   CO2 22 - 32 mmol/L 29   Calcium 8.9 - 10.3 mg/dL 8.9     Brian Heck, DO 10/06/2023, 1:36 PM  PGY-1, [place] FPTS Intern pager: [phone], text pages welcome Secure chat group [facility] Teaching Service

## 2023-10-07 DIAGNOSIS — Z7984 Long term (current) use of oral hypoglycemic drugs: Secondary | ICD-10-CM | POA: Diagnosis not present

## 2023-10-07 DIAGNOSIS — I96 Gangrene, not elsewhere classified: Secondary | ICD-10-CM

## 2023-10-07 DIAGNOSIS — Z89611 Acquired absence of right leg above knee: Secondary | ICD-10-CM | POA: Diagnosis not present

## 2023-10-07 DIAGNOSIS — I70209 Unspecified atherosclerosis of native arteries of extremities, unspecified extremity: Secondary | ICD-10-CM | POA: Diagnosis not present

## 2023-10-07 HISTORY — DX: Gangrene, not elsewhere classified: I96

## 2023-10-07 NOTE — Discharge Summary (Signed)
Family Medicine Teaching [facility] Discharge Summary  Patient name: [name] Medical record number: [id number] Date of birth: [birth year] Age: 72 y.o. Gender: male Date of Admission: 09/21/2023  Date of Discharge: 10/13 Admitting Physician: Para March, DO  Primary Care Provider: Erick Alley, DO Consultants: VVS, podiatry  Indication for Hospitalization: Gangrene of R 2nd and 3rd toes requiring amputation  Brief Hospital Course:  [name] is a 72 y.o.male with a history of PAD and DM neuropathy presented from Podiatry for gangrene of right toes and was admitted to [facility] Medicine Teaching service. His hospital course is detailed below:  Gangrenous R toes (2, 3)/ SP R Above Knee Amputation On arrival, patient was afebrile without leukocytosis with necrosis of R 2nd/3rd toes.  Vascular surgery and podiatry teams were consulted and followed the patient.  Empiric abx discontinued given clinical stability. Patient with right ABI: 0.42. Right LE Art US demonstrated 50-74% stenosis of common femoral artery and low velocities of graft.  RLE angiogram demonstrated threatened right common femoral to peroneal artery bypass. VSS performed R AKA. PT/OT recommended CIR, but the patient did not meet medical necessity for inpatient rehab. PT evaluation recommended SNF. Vascular Surgery advised lifelong aspirin and atorvastatin.  Other chronic conditions were medically managed with home medications and formulary alternatives as necessary: HTN - held amlodipine 5mg  every day, nebivolol 10mg  QD HLD - continue atorvastatin 40mg  QD, zetia 10mg  every day Diabetic neuropathy - continue gabapentin 600mg  BID Mood - continue fluoxetine 10 QD  PCP Follow-up Recommendations: Follow up wound healing and pain control. Vascular surgery appointment in 4 weeks for staple removal. Ensure adequate BP control. Medications were held in the setting of hypotension throughout hospitalization  Discharge  Diagnoses/Problem List:  R AKA for gangrene of toes HTN HLD PAD T2DM IDA Neuropathy Mood disorder GERD  Disposition: SNF  Discharge Condition: Stable  Discharge Exam: ***   Significant Procedures: 10/2 R leg AKA   Significant Labs and Imaging:  MRI R Foot 09/21/23 1. 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 2. Poor mantle of soft tissue overlying the distal phalanx of the second toe, cannot exclude exposure of the bone. 3. Dorsal subcutaneous edema in the forefoot and extending in the toes, most notably in the second and third toes. 4. Low-level generalized muscular edema, likely neurogenic. 5. 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    Results/Tests Pending at Time of Discharge: none  Discharge Medications:  Allergies as of 10/07/2023       Reactions   Glipizide Other (See Comments)   REACTION IS SIDE EFFECT Severe hypoglycemia to 40s.      Med Rec must be completed prior to using this [facility]***       Discharge Instructions: Please refer to Patient Instructions section of EMR for full details.  Patient was counseled important signs and symptoms that should prompt return to medical care, changes in medications, dietary instructions, activity restrictions, and follow up appointments.   Follow-Up Appointments:  Follow-up Information     [facility] Follow up in 5 week(s).   Specialty: Vascular Surgery Why: Office will call you to arrange your appt (sent). Contact information: [address] [place] [address] [phone]        Erick Alley, DO. Schedule an appointment as soon as possible for a visit.   Specialty: Family Medicine Why:  Make an appointment for hospital follow up ASAP. Contact information: [address] [place] [address] [phone]                  Lincoln Brigham, MD 10/07/2023, 10:25 PM PGY-2, [facility] Family Medicine

## 2023-10-07 NOTE — TOC Progression Note (Signed)
Transition of Care [facility]) - Progression Note    Patient Details  Name: [name] MRN: [id number] Date of Birth: [birth year]  [facility] of [facility]) CM/SW Contact  Jimmy Picket, [place] Phone Number: 10/07/2023, 4:38 PM  Clinical Narrative:     Per MD member is able to DC to [facility] tomorrow.   Expected Discharge Plan: Skilled Nursing Facility Barriers to Discharge: Continued Medical Work up  Expected Discharge Plan and Services   Discharge Planning Services: CM Consult Post Acute Care Choice: Durable Medical Equipment, Home Health Living arrangements for the past 2 months: Single Family Home                 DME Arranged: Other see comment (slide board) DME Agency: [employer]                   Social Determinants of Health (SDOH) Interventions SDOH Screenings   Food Insecurity: No Food Insecurity (09/21/2023)  Housing: Low Risk  (09/21/2023)  Transportation Needs: No Transportation Needs (09/21/2023)  Recent Concern: Transportation Needs - Unmet Transportation Needs (08/09/2023)  Utilities: Not At Risk (09/21/2023)  Recent Concern: Utilities - At Risk (08/09/2023)  Alcohol Screen: Low Risk  (02/10/2023)  Depression (PHQ2-9): Low Risk  (06/14/2023)  Financial Resource Strain: Low Risk  (02/10/2023)  Physical Activity: Insufficiently Active (02/10/2023)  Social Connections: Moderately Isolated (02/10/2023)  Stress: No Stress Concern Present (02/10/2023)  Tobacco Use: High Risk (09/27/2023)    Readmission Risk Interventions    08/17/2023    9:33 AM 02/22/2021   12:24 PM  Readmission Risk Prevention Plan  Post Dischage Appt --   Medication Screening Complete   Transportation Screening Complete Complete  PCP or Specialist Appt within 5-7 Days  Complete  Home Care Screening  Complete  Medication Review (RN CM)  Complete

## 2023-10-07 NOTE — Assessment & Plan Note (Signed)
Pain well-controlled with the exception of some intermittent shooting pain which could be explained by phantom limb syndrome.  Recommended to the patient that he should use a mirror to look at his limb and remind himself that it is gone continue with current pain regimen.  - Tylenol 650 mg q6h scheduled  -Continue as needed oxycodone 5 mg every 12 hours by her to physical therapy -Gabapentin 600mg  3 times daily -Continue MiraLAX to twice a day

## 2023-10-07 NOTE — Assessment & Plan Note (Signed)
Patient with pain in his right toe and color changes worsening x 2 weeks.  Sent by podiatry for more evaluation and potential surgical intervention.  Vascular evaluated patient and plans to do ABI and right arterial duplex.  -Admitted to [facility], med-surg, Dr. Deirdre Priest attending -Vascular surgery and podiatry following, appreciate recs -S/p Zosyn and Vanc -Tylenol 650mg  q6h scheduled -Oxycodone 5-325mg  q4h PRN -Continue aspirin 81mg  QD, atorvastatin 40mg  QD  -ABI and RLE arterial duplex

## 2023-10-07 NOTE — Progress Notes (Signed)
Daily Progress Note Intern Pager: [phone]  Patient name: [name] Medical record number: [id number] Date of birth: [birth year] Age: 72 y.o. Gender: male  Primary Care Provider: Erick Alley, DO Consultants: VVS Code Status: Full  Pt Overview and Major Events to Date:  9/26: Admitted 10/2: Right AKA  Assessment and Plan: Dematteo is a 72 year old man who presents with gangrene of the right second and third toes now status post right sided above-the-knee amputation. Pertinent PMH/PSH includes T2DM, HTN, HLD, PAD, neuropathy, depression.  Medically stable for discharge.  Assessment & Plan S/P AKA (above knee amputation), right (HCC) Pain well-controlled with the exception of some intermittent shooting pain which could be explained by phantom limb syndrome.  Recommended to the patient that he should use a mirror to look at his limb and remind himself that it is gone continue with current pain regimen.  - Tylenol 650 mg q6h scheduled  -Continue as needed oxycodone 5 mg every 12 hours by her to physical therapy -Gabapentin 600mg  3 times daily -Continue MiraLAX to twice a day Gangrene of right foot (HCC) Patient with pain in his right toe and color changes worsening x 2 weeks.  Sent by podiatry for more evaluation and potential surgical intervention.  Vascular evaluated patient and plans to do ABI and right arterial duplex.  -Admitted to [facility], med-surg, Dr. Deirdre Priest attending -Vascular surgery and podiatry following, appreciate recs -S/p Zosyn and Vanc -Tylenol 650mg  q6h scheduled -Oxycodone 5-325mg  q4h PRN -Continue aspirin 81mg  QD, atorvastatin 40mg  QD  -ABI and RLE arterial duplex  Chronic and Stable Problems:  Hypertension: Holding home meds in the setting of normotension Hyperlipidemia: Atorvastatin 40 mg daily, Zetia 10 mg daily PAD: Aspirin 81 mg daily T2DM: Metformin 1000 mg twice daily Iron deficiency anemia: Ferrous sulfate 325 mg every other day Neuropathy:  Gabapentin as above Mood: Fluoxetine 10 mg daily, mirtazapine 7.5 mg nightly Reflux: Pantoprazole 40 mg daily   FEN/GI: Dysphagia 3 PPx: SQ hep Dispo: Medically stable for SNF.  Subjective:  ***  Objective: Temp:  [97.7 F (36.5 C)-98.9 F (37.2 C)] 98.9 F (37.2 C) (10/12 1927) Pulse Rate:  [88-99] 99 (10/12 1927) Resp:  [16-20] 20 (10/12 1927) BP: (92-111)/(66-74) 95/66 (10/12 1927) SpO2:  [95 %-100 %] 95 % (10/12 1927) Physical Exam: General: *** Cardiovascular: *** Respiratory: *** Abdomen: *** Extremities: ***  Laboratory: Most recent CBC Lab Results  Component Value Date   WBC 10.0 10/04/2023   HGB 9.1 (L) 10/04/2023   HCT 31.3 (L) 10/04/2023   MCV 73.6 (L) 10/04/2023   PLT 508 (H) 10/04/2023   Most recent BMP    Latest Ref Rng & Units 09/29/2023    4:33 AM  BMP  Glucose 70 - 99 mg/dL 324   BUN 8 - 23 mg/dL 13   Creatinine 4.01 - 1.24 mg/dL 0.27   Sodium 253 - 664 mmol/L 134   Potassium 3.5 - 5.1 mmol/L 3.9   Chloride 98 - 111 mmol/L 96   CO2 22 - 32 mmol/L 29   Calcium 8.9 - 10.3 mg/dL 8.9    Lincoln Brigham, MD 10/07/2023, 10:27 PM  PGY-2, [facility] FPTS Intern pager: [phone], text pages welcome Secure chat group [facility] Teaching Service

## 2023-10-07 NOTE — Progress Notes (Signed)
Daily Progress Note Intern Pager: [phone]  Patient name: [name] Medical record number: [id number] Date of birth: October 16, 1951 Age: 72 y.o. Gender: male  Primary Care Provider: Erick Alley, DO Consultants: Vascular surgery Code Status: Full  Pt Overview and Major Events to Date:  9/26: Admitted 10/2: Right AKA  Assessment and Plan: [name] is a 72 year old man who presents with gangrene of the right second and third toes now status post right sided above-the-knee amputation. Pertinent PMH/PSH includes T2DM, HTN, HLD, PAD, neuropathy, depression.  Medically stable for discharge. Assessment & Plan S/P AKA (above knee amputation), right (HCC) Pain well-controlled with the exception of some intermittent shooting pain which could be explained by phantom limb syndrome.  Recommended to the patient that he should use a mirror to look at his limb and remind himself that it is gone continue with current pain regimen.  - Tylenol 650 mg q6h scheduled  -Continue as needed oxycodone 5 mg every 12 hours by her to physical therapy -Gabapentin 600mg  3 times daily -Continue MiraLAX to twice a day  Chronic and Stable Issues: Hypertension: Holding home meds in the setting of normotension Hyperlipidemia: Atorvastatin 40 mg daily, Zetia 10 mg daily PAD: Aspirin 81 mg daily T2DM: Metformin 1000 mg twice daily Iron deficiency anemia: Ferrous sulfate 325 mg every other day Neuropathy: Gabapentin as above Mood: Fluoxetine 10 mg daily, mirtazapine 7.5 mg nightly Reflux: Pantoprazole 40 mg daily  FEN/GI: Dysphagia 3 PPx: Heparin subcu every 8 hours Dispo:SNF [name]). Barriers include insurance authorization.   Subjective:  No complaints this morning on rounding, states he is doing well pain wise as he just received an oxycodone.  Objective: Temp:  [97.6 F (36.4 C)-98.4 F (36.9 C)] 98.4 F (36.9 C) (10/12 0329) Pulse Rate:  [86-100] 88 (10/12 0329) Resp:  [18] 18 (10/12  0329) BP: (92-111)/(70-88) 92/74 (10/12 0548) SpO2:  [95 %-100 %] 100 % (10/12 0329) Physical Exam: General: Well-appearing, NAD Cardiovascular: RRR, no murmurs auscultated Respiratory: CTAB, normal WOB Extremities: Bilateral AKA without signs of infection  Laboratory: Most recent CBC Lab Results  Component Value Date   WBC 10.0 10/04/2023   HGB 9.1 (L) 10/04/2023   HCT 31.3 (L) 10/04/2023   MCV 73.6 (L) 10/04/2023   PLT 508 (H) 10/04/2023   Most recent BMP    Latest Ref Rng & Units 09/29/2023    4:33 AM  BMP  Glucose 70 - 99 mg/dL 147   BUN 8 - 23 mg/dL 13   Creatinine 8.29 - 1.24 mg/dL 5.62   Sodium 130 - 865 mmol/L 134   Potassium 3.5 - 5.1 mmol/L 3.9   Chloride 98 - 111 mmol/L 96   CO2 22 - 32 mmol/L 29   Calcium 8.9 - 10.3 mg/dL 8.9    Imaging/Diagnostic Tests: No recent imaging results  Brian Mattocks, DO 10/07/2023, 6:23 AM PGY-3, [facility] FPTS Intern pager: [phone], text pages welcome Secure chat group [facility] Teaching Service

## 2023-10-07 NOTE — Plan of Care (Signed)
Problem: Education: Goal: Understanding of CV disease, CV risk reduction, and recovery process will improve Outcome: Progressing   Problem: Activity: Goal: Ability to return to baseline activity level will improve Outcome: Progressing   Problem: Cardiovascular: Goal: Ability to achieve and maintain adequate cardiovascular perfusion will improve Outcome: Progressing

## 2023-10-08 DIAGNOSIS — F329 Major depressive disorder, single episode, unspecified: Secondary | ICD-10-CM | POA: Diagnosis not present

## 2023-10-08 DIAGNOSIS — I96 Gangrene, not elsewhere classified: Secondary | ICD-10-CM | POA: Diagnosis not present

## 2023-10-08 DIAGNOSIS — F4323 Adjustment disorder with mixed anxiety and depressed mood: Secondary | ICD-10-CM | POA: Diagnosis not present

## 2023-10-08 DIAGNOSIS — I70209 Unspecified atherosclerosis of native arteries of extremities, unspecified extremity: Secondary | ICD-10-CM | POA: Diagnosis not present

## 2023-10-08 DIAGNOSIS — R5383 Other fatigue: Secondary | ICD-10-CM | POA: Diagnosis not present

## 2023-10-08 DIAGNOSIS — R279 Unspecified lack of coordination: Secondary | ICD-10-CM | POA: Diagnosis not present

## 2023-10-08 DIAGNOSIS — Z7401 Bed confinement status: Secondary | ICD-10-CM | POA: Diagnosis not present

## 2023-10-08 DIAGNOSIS — I469 Cardiac arrest, cause unspecified: Secondary | ICD-10-CM | POA: Diagnosis not present

## 2023-10-08 DIAGNOSIS — E1143 Type 2 diabetes mellitus with diabetic autonomic (poly)neuropathy: Secondary | ICD-10-CM | POA: Diagnosis not present

## 2023-10-08 DIAGNOSIS — E1169 Type 2 diabetes mellitus with other specified complication: Secondary | ICD-10-CM | POA: Diagnosis not present

## 2023-10-08 DIAGNOSIS — D509 Iron deficiency anemia, unspecified: Secondary | ICD-10-CM | POA: Diagnosis not present

## 2023-10-08 DIAGNOSIS — Z89612 Acquired absence of left leg above knee: Secondary | ICD-10-CM | POA: Diagnosis not present

## 2023-10-08 DIAGNOSIS — R5381 Other malaise: Secondary | ICD-10-CM | POA: Diagnosis not present

## 2023-10-08 DIAGNOSIS — G4700 Insomnia, unspecified: Secondary | ICD-10-CM | POA: Diagnosis not present

## 2023-10-08 DIAGNOSIS — Z741 Need for assistance with personal care: Secondary | ICD-10-CM | POA: Diagnosis not present

## 2023-10-08 DIAGNOSIS — I739 Peripheral vascular disease, unspecified: Secondary | ICD-10-CM | POA: Diagnosis not present

## 2023-10-08 DIAGNOSIS — K219 Gastro-esophageal reflux disease without esophagitis: Secondary | ICD-10-CM | POA: Diagnosis not present

## 2023-10-08 DIAGNOSIS — Z89611 Acquired absence of right leg above knee: Secondary | ICD-10-CM | POA: Diagnosis not present

## 2023-10-08 DIAGNOSIS — E785 Hyperlipidemia, unspecified: Secondary | ICD-10-CM | POA: Diagnosis not present

## 2023-10-08 DIAGNOSIS — Z7984 Long term (current) use of oral hypoglycemic drugs: Secondary | ICD-10-CM | POA: Diagnosis not present

## 2023-10-08 DIAGNOSIS — K760 Fatty (change of) liver, not elsewhere classified: Secondary | ICD-10-CM | POA: Diagnosis not present

## 2023-10-08 DIAGNOSIS — I10 Essential (primary) hypertension: Secondary | ICD-10-CM | POA: Diagnosis not present

## 2023-10-08 DIAGNOSIS — M6281 Muscle weakness (generalized): Secondary | ICD-10-CM | POA: Diagnosis not present

## 2023-10-08 DIAGNOSIS — F32A Depression, unspecified: Secondary | ICD-10-CM | POA: Diagnosis not present

## 2023-10-08 MED ORDER — GABAPENTIN 300 MG PO CAPS: 600.0000 mg | ORAL_CAPSULE | Freq: Three times a day (TID) | ORAL | Status: DC

## 2023-10-08 MED ORDER — OXYCODONE HCL 5 MG PO TABS: 5.0000 mg | ORAL_TABLET | Freq: Two times a day (BID) | ORAL | 0 refills | Status: DC | PRN

## 2023-10-08 MED ORDER — FERROUS SULFATE 325 (65 FE) MG PO TABS: 325.0000 mg | ORAL_TABLET | ORAL | Status: DC

## 2023-10-08 MED ORDER — ENSURE ENLIVE PO LIQD: 237.0000 mL | Freq: Two times a day (BID) | ORAL | Status: AC

## 2023-10-08 MED ORDER — SENNA 8.6 MG PO TABS: 1.0000 | ORAL_TABLET | Freq: Every day | ORAL | Status: DC

## 2023-10-08 MED ORDER — POLYETHYLENE GLYCOL 3350 17 G PO PACK: 17.0000 g | PACK | Freq: Two times a day (BID) | ORAL | Status: DC

## 2023-10-08 MED ORDER — NICOTINE 7 MG/24HR TD PT24: 7.0000 mg | MEDICATED_PATCH | Freq: Every day | TRANSDERMAL | Status: DC

## 2023-10-08 MED ORDER — MIRTAZAPINE 7.5 MG PO TABS: 7.5000 mg | ORAL_TABLET | Freq: Every day | ORAL | Status: DC

## 2023-10-08 NOTE — TOC Transition Note (Signed)
Transition of Care [facility]) - CM/SW Discharge Note   Patient Details  Name: [name] MRN: [id number] Date of Birth: [birth year]  Transition [facility] Care [facility]) CM/SW Contact:  Patrice Paradise, LCSW Phone Number: [phone] 10/08/2023, 9:04 AM   Clinical Narrative:     Patient will DC to:?[facility] Anticipated DC date:?10/08/2023 Family notified:?[name] by: Sharin Mons   Per MD patient ready for DC to [facility]. RN, patient, patient's family, and facility notified of DC. Discharge Summary sent to facility. RN given number for report [phone]. DC packet on chart. Ambulance transport requested for patient.   CSW signing off.   Judd Lien, [place] [phone]   Final next level of care: Skilled Nursing Facility Barriers to Discharge: Continued Medical Work up   Patient Goals and CMS Choice CMS Medicare.gov Compare Post Acute Care list provided to:: Patient Choice offered to / list presented to : Patient  Discharge Placement                         Discharge Plan and Services Additional resources added to the After Visit Summary for     Discharge Planning Services: CM Consult Post Acute Care Choice: Durable Medical Equipment, Home Health          DME Arranged: Other see comment (slide board) DME Agency: [employer]                  Social Determinants of Health (SDOH) Interventions SDOH Screenings   Food Insecurity: No Food Insecurity (09/21/2023)  Housing: Low Risk  (09/21/2023)  Transportation Needs: No Transportation Needs (09/21/2023)  Recent Concern: Transportation Needs - Unmet Transportation Needs (08/09/2023)  Utilities: Not At Risk (09/21/2023)  Recent Concern: Utilities - At Risk (08/09/2023)  Alcohol Screen: Low Risk  (02/10/2023)  Depression (PHQ2-9): Low Risk  (06/14/2023)  Financial Resource Strain: Low Risk  (02/10/2023)  Physical Activity: Insufficiently Active (02/10/2023)  Social Connections: Moderately Isolated (02/10/2023)   Stress: No Stress Concern Present (02/10/2023)  Tobacco Use: High Risk (09/27/2023)     Readmission Risk Interventions    08/17/2023    9:33 AM 02/22/2021   12:24 PM  Readmission Risk Prevention Plan  Post Dischage Appt --   Medication Screening Complete   Transportation Screening Complete Complete  PCP or Specialist Appt within 5-7 Days  Complete  Home Care Screening  Complete  Medication Review (RN CM)  Complete

## 2023-10-08 NOTE — Progress Notes (Signed)
Patient is being discharged to [facility]. Report was given to [facility], next steps and discharge packet accompanied patient.

## 2023-10-08 NOTE — Progress Notes (Signed)
Mobility Specialist Progress Note:    10/08/23 1200  Mobility  Activity Transferred from bed to chair  Level of Assistance Contact guard assist, steadying assist  Assistive Device Sliding board  Mobility Referral Yes  $Mobility charge 1 Mobility  Mobility Specialist Start Time (ACUTE ONLY) 1140  Mobility Specialist Stop Time (ACUTE ONLY) 1154  Mobility Specialist Time Calculation (min) (ACUTE ONLY) 14 min   Received pt in bed having no complaints and agreeable to mobility. Pt was asymptomatic throughout ambulation and returned to room w/o fault. Left in chair w/ call bell in reach and all needs met.   D'Vante Earlene Plater Mobility Specialist Please contact via Special educational needs teacher or Rehab office at [phone]

## 2023-10-09 DIAGNOSIS — Z89611 Acquired absence of right leg above knee: Secondary | ICD-10-CM | POA: Diagnosis not present

## 2023-10-09 DIAGNOSIS — K219 Gastro-esophageal reflux disease without esophagitis: Secondary | ICD-10-CM | POA: Diagnosis not present

## 2023-10-09 DIAGNOSIS — E785 Hyperlipidemia, unspecified: Secondary | ICD-10-CM | POA: Diagnosis not present

## 2023-10-09 DIAGNOSIS — F329 Major depressive disorder, single episode, unspecified: Secondary | ICD-10-CM | POA: Diagnosis not present

## 2023-10-09 DIAGNOSIS — I739 Peripheral vascular disease, unspecified: Secondary | ICD-10-CM | POA: Diagnosis not present

## 2023-10-09 DIAGNOSIS — E1169 Type 2 diabetes mellitus with other specified complication: Secondary | ICD-10-CM | POA: Diagnosis not present

## 2023-10-09 DIAGNOSIS — G4700 Insomnia, unspecified: Secondary | ICD-10-CM | POA: Diagnosis not present

## 2023-10-09 DIAGNOSIS — Z89612 Acquired absence of left leg above knee: Secondary | ICD-10-CM | POA: Diagnosis not present

## 2023-10-13 DIAGNOSIS — Z89611 Acquired absence of right leg above knee: Secondary | ICD-10-CM | POA: Diagnosis not present

## 2023-10-13 DIAGNOSIS — I10 Essential (primary) hypertension: Secondary | ICD-10-CM | POA: Diagnosis not present

## 2023-10-20 ENCOUNTER — Other Ambulatory Visit: Payer: Self-pay | Admitting: Student

## 2023-10-20 DIAGNOSIS — E7800 Pure hypercholesterolemia, unspecified: Secondary | ICD-10-CM

## 2023-10-20 DIAGNOSIS — M25511 Pain in right shoulder: Secondary | ICD-10-CM

## 2023-10-20 DIAGNOSIS — I10 Essential (primary) hypertension: Secondary | ICD-10-CM

## 2023-10-29 ENCOUNTER — Other Ambulatory Visit: Payer: Self-pay | Admitting: Student

## 2023-10-29 DIAGNOSIS — E7800 Pure hypercholesterolemia, unspecified: Secondary | ICD-10-CM

## 2023-10-29 DIAGNOSIS — I10 Essential (primary) hypertension: Secondary | ICD-10-CM

## 2023-11-03 ENCOUNTER — Telehealth: Payer: Self-pay

## 2023-11-03 NOTE — Telephone Encounter (Signed)
Pt called c/o pain at the staples on his R AKA.  Reviewed pt's chart, spoke to [place], [place], returned call for clarification, two identifiers used. Relayed Matt's advice on using massage to help with pain. Pt stated that he has been doing that for the past few weeks. Instructed him to use some OTC lidocaine spray/cream. Pt has appt on 11/12 for staple removal. Confirmed understanding.

## 2023-11-07 ENCOUNTER — Encounter: Payer: Medicare HMO | Admitting: Physician Assistant

## 2023-11-08 ENCOUNTER — Ambulatory Visit (INDEPENDENT_AMBULATORY_CARE_PROVIDER_SITE_OTHER): Payer: Medicare HMO | Admitting: Physician Assistant

## 2023-11-08 VITALS — BP 102/69 | HR 76 | Temp 97.6°F | Wt 160.0 lb

## 2023-11-08 DIAGNOSIS — I739 Peripheral vascular disease, unspecified: Secondary | ICD-10-CM

## 2023-11-08 DIAGNOSIS — R0989 Other specified symptoms and signs involving the circulatory and respiratory systems: Secondary | ICD-10-CM

## 2023-11-08 MED ORDER — OXYCODONE HCL 5 MG PO TABS
5.0000 mg | ORAL_TABLET | Freq: Four times a day (QID) | ORAL | 0 refills | Status: DC | PRN
Start: 2023-11-08 — End: 2024-03-04

## 2023-11-08 NOTE — Progress Notes (Signed)
POST OPERATIVE OFFICE NOTE    CC:  F/u for surgery  HPI:  [name] is a 72 y.o. male who is s/p right above-knee amputation by Dr. Chestine Spore on 09/27/2023.  He has a history of multiple right lower extremity interventions with worsening right foot gangrene.  He also has a history of several left lower extremity interventions, unfortunately resulting in a left above-knee amputation on 02/19/2021 by Dr. Lenell Antu.  We also follow the patient for bilateral carotid artery stenosis.  The patient returns today for follow-up.  He states he is doing okay since surgery.  He has phantom pains in both of his stumps.  He denies any drainage or erythema around his incision.  His incision is still fairly tender.  He has been keeping his leg clean and dry daily.  He denies any fevers or chills.   Allergies  Allergen Reactions   Glipizide Other (See Comments)    REACTION IS SIDE EFFECT Severe hypoglycemia to 40s.     Current Outpatient Medications  Medication Sig Dispense Refill   acetaminophen (TYLENOL) 325 MG tablet Take 1-2 tablets (325-650 mg total) by mouth every 4 (four) hours as needed for mild pain (or temp >/= 101 F).     albuterol (VENTOLIN HFA) 108 (90 Base) MCG/ACT inhaler TAKE 2 PUFFS BY MOUTH EVERY 6 HOURS AS NEEDED FOR WHEEZE OR SHORTNESS OF BREATH 8.5 each 1   ascorbic acid (VITAMIN C) 500 MG tablet Take 1 tablet (500 mg total) by mouth 2 (two) times daily. 100 tablet 0   aspirin EC 81 MG tablet Take 1 tablet (81 mg total) by mouth daily. Swallow whole.     atorvastatin (LIPITOR) 40 MG tablet TAKE 1 TABLET BY MOUTH EVERY DAY 90 tablet 1   Baclofen 5 MG TABS Take 1 tablet (5 mg total) by mouth 3 (three) times daily. (Patient taking differently: Take 5 mg by mouth 3 (three) times daily as needed (muscle spasms).) 90 each 0   docusate sodium (COLACE) 100 MG capsule Take 1 capsule (100 mg total) by mouth daily. (Patient taking differently: Take 100 mg by mouth daily as needed for mild  constipation.)     ezetimibe (ZETIA) 10 MG tablet TAKE 1 TABLET BY MOUTH EVERY DAY 90 tablet 1   feeding supplement (ENSURE ENLIVE / ENSURE PLUS) LIQD Take 237 mLs by mouth 2 (two) times daily between meals.     ferrous sulfate 325 (65 FE) MG tablet Take 1 tablet (325 mg total) by mouth every other day.     FLUoxetine (PROZAC) 10 MG capsule Take 1 capsule (10 mg total) by mouth daily. 30 capsule 0   gabapentin (NEURONTIN) 300 MG capsule Take 2 capsules (600 mg total) by mouth 3 (three) times daily.     melatonin 3 MG TABS tablet Take 2 tablets (6 mg total) by mouth at bedtime. 60 tablet 0   metFORMIN (GLUCOPHAGE) 1000 MG tablet Take 1 tablet (1,000 mg total) by mouth 2 (two) times daily with a meal. 60 tablet 0   mirtazapine (REMERON) 7.5 MG tablet Take 1 tablet (7.5 mg total) by mouth at bedtime.     nicotine (NICODERM CQ - DOSED IN MG/24 HR) 7 mg/24hr patch Place 1 patch (7 mg total) onto the skin daily.     oxyCODONE (OXY IR/ROXICODONE) 5 MG immediate release tablet Take 1 tablet (5 mg total) by mouth every 12 (twelve) hours as needed for moderate pain or severe pain. 15 tablet 0   pantoprazole (PROTONIX)  40 MG tablet TAKE 1 TABLET BY MOUTH EVERY DAY AS NEEDED 90 tablet 1   polyethylene glycol (MIRALAX / GLYCOLAX) 17 g packet Take 17 g by mouth 2 (two) times daily.     senna (SENOKOT) 8.6 MG TABS tablet Take 1 tablet (8.6 mg total) by mouth daily.     Zinc Sulfate 220 (50 Zn) MG TABS Take 1 tablet (220 mg total) by mouth daily. 14 tablet 0   No current facility-administered medications for this visit.     ROS:  See HPI  Physical Exam:  Incision: Right above-knee amputation looks great.  Incision is well-healed.  All staples removed without issue Extremities: Well-healed left AKA without wounds Neuro: Intact motor and sensation of bilateral AKA stumps    Assessment/Plan:  This is a 72 y.o. male who is here for postop check  -The patient recently underwent right above-knee amputation  for worsening right foot gangrene -His right above-knee amputation is healing appropriately.  His incision looks great today.  All staples were removed without issue. -He denies any issues with erythema along the incision or drainage.  He denies any fevers or chills -He continues to have phantom pain in both of his amputation stumps.  He takes gabapentin for this, which does help.  He ran out of his oxycodone after surgery.  I will send in a small refill for pain control -Prior to right above-knee amputation, the patient was ambulatory on his left AKA with a prosthetic.  He usually walked with a walker.  I will refer the patient back to [facility] to begin the prosthetic process on the right. -He can follow-up with our office in 1 year with repeat carotid duplex   The patient has a right Above Knee Amputation. The patient is well motivated to return to their prior functional status by utilizing a prosthesis to perform ADL's and maintain a healthy lifestyle. The patient has the physical and cognitive capacity to function with a prosthesis.   Functional Level: K1 Household Ambulator: Has the ability or potential to use prosthesis for transfers/ambulation on level surfaces at a fixed cadence  Residual Limb History: The skin condition of the residual limb is intact. The patient will continue to monitor the skin of the residual limb and follow hygiene instructions.  The patient is experiencing phantom limb pain   Prosthetic Prescription Plan: Counseling and education regarding prosthetic management will be provided to the patient via a certified prosthetist. A multi-discipline team, including physical therapy, will manage the prosthetic fabrication, fitting and prosthetic gait training.     1) The patient has expressed his desire to ambulate with a prosthesis 2) He currently mobilizes via wheelchair.  He is able to slide for transfers 3) Expected functional level: Ambulate on level surfaces 4)  Physician and PA agree with prosthetic/orthotic plan of care 5) There are no comorbid conditions that will affect ambulation or the patient's ability to use the prosthetic  [name], PA-C [facility] [phone]   Clinic MD:  Randie Heinz

## 2023-11-13 ENCOUNTER — Ambulatory Visit (INDEPENDENT_AMBULATORY_CARE_PROVIDER_SITE_OTHER): Payer: Medicare HMO | Admitting: Student

## 2023-11-13 VITALS — BP 99/65 | HR 74 | Ht 67.0 in

## 2023-11-13 DIAGNOSIS — I739 Peripheral vascular disease, unspecified: Secondary | ICD-10-CM | POA: Diagnosis not present

## 2023-11-13 DIAGNOSIS — Z89611 Acquired absence of right leg above knee: Secondary | ICD-10-CM

## 2023-11-13 DIAGNOSIS — F32A Depression, unspecified: Secondary | ICD-10-CM | POA: Diagnosis not present

## 2023-11-13 DIAGNOSIS — D509 Iron deficiency anemia, unspecified: Secondary | ICD-10-CM | POA: Diagnosis not present

## 2023-11-13 DIAGNOSIS — K5900 Constipation, unspecified: Secondary | ICD-10-CM | POA: Diagnosis not present

## 2023-11-13 DIAGNOSIS — I10 Essential (primary) hypertension: Secondary | ICD-10-CM | POA: Diagnosis not present

## 2023-11-13 DIAGNOSIS — Z7984 Long term (current) use of oral hypoglycemic drugs: Secondary | ICD-10-CM | POA: Diagnosis not present

## 2023-11-13 DIAGNOSIS — E1151 Type 2 diabetes mellitus with diabetic peripheral angiopathy without gangrene: Secondary | ICD-10-CM

## 2023-11-13 LAB — POCT GLYCOSYLATED HEMOGLOBIN (HGB A1C): HbA1c, POC (controlled diabetic range): 6.5 % (ref 0.0–7.0)

## 2023-11-13 MED ORDER — FERROUS SULFATE 325 (65 FE) MG PO TABS
325.0000 mg | ORAL_TABLET | ORAL | 0 refills | Status: AC
Start: 2023-11-13 — End: ?

## 2023-11-13 MED ORDER — ASPIRIN 81 MG PO TBEC: 81.0000 mg | DELAYED_RELEASE_TABLET | Freq: Every day | ORAL | 3 refills | Status: AC

## 2023-11-13 MED ORDER — FLUOXETINE HCL 20 MG PO TABS
20.0000 mg | ORAL_TABLET | Freq: Every day | ORAL | 3 refills | Status: DC
Start: 2023-11-13 — End: 2024-03-04

## 2023-11-13 NOTE — Progress Notes (Unsigned)
    SUBJECTIVE:   CHIEF COMPLAINT / HPI:   Right AKA Hx PAD. completed 09/27/2023. Has been following outpatient with vascular, last seen 11/08/2023, noted that incision is healing well and all staples were removed.  Plan for patient to get prosthetic.  Per chart review, plan was for patient to stop Plavix and continue aspirin.    Has been feeling down after the AKA but denies any thoughts of self harm, requests to increase Prozac.  Reports he is doing well other than having phantom limb pain, already on high dose gabapentin 600 mg 3 times daily.  Vascular PA sent in refill of oxycodone which patient was unaware of.  Denies constipation currently. States he is not currently getting home health PT or OT but declines needing this.  Lives at home with his wife who is able to help him.  HTN Currently taking benazepril 5 mg daily, nebivolol 20 mg daily although blood pressure medications were recommended to be held after last hospitalization due to soft pressures.  Anemia Chronic IDA.  He is up-to-date on colonoscopy, is due in 2026.  Also had EGD at the time of colonoscopy in 2021. CBC 10/04/2023 with hemoglobin stable around baseline of 9.1, up from 7.7 after his AKA.  T2DM A1c today 6.5.  Currently taking metformin 1000 mg twice daily.   PERTINENT  PMH / PSH: Left AKA, right AKA, T2DM, diabetic neuropathy, PAD  OBJECTIVE:   BP 99/65   Pulse 74   Ht 5\' 7"  (1.702 m)   SpO2 99%   BMI 25.06 kg/m    General: NAD, pleasant, in wheelchair Cardiac: RRR, no murmurs. Respiratory: Breathing comfortably on room air Extremities: Bilateral AKA's, wearing pants, stumps not visible Skin: warm and dry Neuro: alert, no obvious focal deficits Psych: Normal affect and mood  ASSESSMENT/PLAN:   Primary hypertension Soft BP in office today but reassuringly feeling well.  Patient advised to stop taking nebivolol and continue on benazepril 5 mg daily due to renoprotective properties. -Return in 2  weeks for BP check  S/P AKA (above knee amputation), right [facility]) Patient is overall doing well.  Clarified plan for antiplatelet therapy.  -Continue following with vascular -Stop Plavix, start aspirin 81 mg daily. Rx bASA sent to pharmacy -Continue gabapentin for phantom limb pain, patient now aware of oxycodone rx sent to pharmacy by vascular if he decides to pick it up  Iron deficiency anemia Hemoglobin near baseline at 9.1 last month prior to discharge s/p AKA.  Has not been taking iron supplement.  Denies constipation currently but as he is starting iron supplement and possibly oxycodone, will send in Rx for MiraLAX. 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  Does request to increase dose of Prozac.  Denies SI. -Increase Prozac to 20 mg daily -Can discuss therapy resources at future visit if appropriate     Dr. Erick Alley, DO [facility]

## 2023-11-13 NOTE — Patient Instructions (Signed)
It was great to see you! Thank you for allowing me to participate in your care!  I recommend that you always bring your medications to each appointment as this makes it easy to ensure you are on the correct medications and helps [place] not miss when refills are needed.  Our plans for today:  - STOP taking plavix. Instead, start Aspirin 81 mg daily which has been sent to pharmacy - STOP nevibolol. Continue Benazepril  -Increase Prozac to 20 mg  - return in 1-2 weeks for blood pressure check   We are checking some labs today, I will call you if they are abnormal will send you a MyChart message or a letter if they are normal.  If you do not hear about your labs in the next 2 weeks please let us know.  Take care and seek immediate care sooner if you develop any concerns.   Dr. Erick Alley, DO [facility]

## 2023-11-14 MED ORDER — POLYETHYLENE GLYCOL 3350 17 G PO PACK
17.0000 g | PACK | Freq: Every day | ORAL | 1 refills | Status: DC | PRN
Start: 2023-11-14 — End: 2024-05-17

## 2023-11-14 NOTE — Assessment & Plan Note (Signed)
Patient is overall doing well.  Clarified plan for antiplatelet therapy.  -Continue following with vascular -Stop Plavix, start aspirin 81 mg daily. Rx bASA sent to pharmacy -Continue gabapentin for phantom limb pain, patient now aware of oxycodone rx sent to pharmacy by vascular if he decides to pick it up

## 2023-11-14 NOTE — Assessment & Plan Note (Addendum)
Well-controlled with A1c 6.5 -Continue metformin 1000 mg twice daily -Next A1c in 6 months -Microalbumin creatinine ratio and discuss diabetic eye exam at next visit if appropriate

## 2023-11-14 NOTE — Assessment & Plan Note (Signed)
Hemoglobin near baseline at 9.1 last month prior to discharge s/p AKA.  Has not been taking iron supplement.  Denies constipation currently but as he is starting iron supplement and possibly oxycodone, will send in Rx for MiraLAX. -Rx ferrous sulfate 325 mg every other day -Rx MiraLAX

## 2023-11-14 NOTE — Assessment & Plan Note (Signed)
Soft BP in office today but reassuringly feeling well.  Patient advised to stop taking nebivolol and continue on benazepril 5 mg daily due to renoprotective properties. -Return in 2 weeks for BP check

## 2023-11-14 NOTE — Assessment & Plan Note (Signed)
PHQ-9 score of 0 however patient notes he has been feeling more down/depressed since his most recent AKA.  Does request to increase dose of Prozac.  Denies SI. -Increase Prozac to 20 mg daily -Can discuss therapy resources at future visit if appropriate

## 2023-11-17 ENCOUNTER — Other Ambulatory Visit: Payer: Self-pay

## 2023-11-17 DIAGNOSIS — I6523 Occlusion and stenosis of bilateral carotid arteries: Secondary | ICD-10-CM

## 2023-12-02 ENCOUNTER — Other Ambulatory Visit: Payer: Self-pay | Admitting: Student

## 2023-12-04 NOTE — Progress Notes (Unsigned)
    SUBJECTIVE:   CHIEF COMPLAINT / HPI:   HTN D/t soft pressure last visit, d/c'd nebivolol and continued on benazepril 5 mg  S/P AKA Still having phantom limb pain.  Taking gabapentin 600 mg TID      PERTINENT  PMH / PSH: ***  OBJECTIVE:   There were no vitals taken for this visit. ***  General: NAD, pleasant, able to participate in exam Cardiac: RRR, no murmurs. Respiratory: CTAB, normal effort, No wheezes, rales or rhonchi Abdomen: Bowel sounds present, nontender, nondistended, no hepatosplenomegaly. Extremities: no edema or cyanosis. Skin: warm and dry, no rashes noted Neuro: alert, no obvious focal deficits Psych: Normal affect and mood  ASSESSMENT/PLAN:   No problem-specific Assessment & Plan notes found for this encounter.   T2DM Well controlled on metformin.  UACR today and referral for eye exam***  Dr. Erick Alley, DO [place] [facility]    {    This will disappear when note is signed, click to select method of visit    :1}

## 2023-12-08 ENCOUNTER — Ambulatory Visit: Payer: Medicare HMO | Admitting: Student

## 2023-12-08 DIAGNOSIS — E1151 Type 2 diabetes mellitus with diabetic peripheral angiopathy without gangrene: Secondary | ICD-10-CM

## 2023-12-29 ENCOUNTER — Other Ambulatory Visit: Payer: Self-pay | Admitting: Vascular Surgery

## 2023-12-29 ENCOUNTER — Other Ambulatory Visit: Payer: Self-pay | Admitting: Student

## 2023-12-29 DIAGNOSIS — I10 Essential (primary) hypertension: Secondary | ICD-10-CM

## 2023-12-29 DIAGNOSIS — M25511 Pain in right shoulder: Secondary | ICD-10-CM

## 2024-02-01 DIAGNOSIS — E1151 Type 2 diabetes mellitus with diabetic peripheral angiopathy without gangrene: Secondary | ICD-10-CM | POA: Diagnosis not present

## 2024-02-01 DIAGNOSIS — Z8249 Family history of ischemic heart disease and other diseases of the circulatory system: Secondary | ICD-10-CM | POA: Diagnosis not present

## 2024-02-01 DIAGNOSIS — G547 Phantom limb syndrome without pain: Secondary | ICD-10-CM | POA: Diagnosis not present

## 2024-02-01 DIAGNOSIS — K219 Gastro-esophageal reflux disease without esophagitis: Secondary | ICD-10-CM | POA: Diagnosis not present

## 2024-02-01 DIAGNOSIS — E1159 Type 2 diabetes mellitus with other circulatory complications: Secondary | ICD-10-CM | POA: Diagnosis not present

## 2024-02-01 DIAGNOSIS — Z008 Encounter for other general examination: Secondary | ICD-10-CM | POA: Diagnosis not present

## 2024-02-01 DIAGNOSIS — M1990 Unspecified osteoarthritis, unspecified site: Secondary | ICD-10-CM | POA: Diagnosis not present

## 2024-02-01 DIAGNOSIS — I771 Stricture of artery: Secondary | ICD-10-CM | POA: Diagnosis not present

## 2024-02-01 DIAGNOSIS — E1136 Type 2 diabetes mellitus with diabetic cataract: Secondary | ICD-10-CM | POA: Diagnosis not present

## 2024-02-01 DIAGNOSIS — E785 Hyperlipidemia, unspecified: Secondary | ICD-10-CM | POA: Diagnosis not present

## 2024-02-01 DIAGNOSIS — E1142 Type 2 diabetes mellitus with diabetic polyneuropathy: Secondary | ICD-10-CM | POA: Diagnosis not present

## 2024-02-01 DIAGNOSIS — Z7982 Long term (current) use of aspirin: Secondary | ICD-10-CM | POA: Diagnosis not present

## 2024-02-01 DIAGNOSIS — F325 Major depressive disorder, single episode, in full remission: Secondary | ICD-10-CM | POA: Diagnosis not present

## 2024-02-22 ENCOUNTER — Telehealth: Payer: Self-pay

## 2024-02-22 NOTE — Telephone Encounter (Signed)
 Received call from nurse case manager with [facility] regarding concerns for medication compliance.   She reports that patient has not recently picked up prescription for metformin or plavix.   Advised that per chart review, Plavix was discontinued and replaced with aspirin 81 mg.   Per chart review, patient is still being prescribed metformin. Last office visit with 11/13/23.  Advised RN case manager that I would call patient to schedule follow up visit for diabetes and ensure medication compliance.   Called patient and scheduled with PCP on 03/04/24.  Veronda Prude, RN

## 2024-03-04 ENCOUNTER — Telehealth: Payer: Self-pay

## 2024-03-04 ENCOUNTER — Ambulatory Visit (INDEPENDENT_AMBULATORY_CARE_PROVIDER_SITE_OTHER): Payer: Medicare HMO | Admitting: Student

## 2024-03-04 ENCOUNTER — Encounter: Payer: Self-pay | Admitting: Student

## 2024-03-04 ENCOUNTER — Ambulatory Visit: Payer: Medicare HMO

## 2024-03-04 VITALS — Ht 67.0 in | Wt 155.0 lb

## 2024-03-04 VITALS — BP 129/72 | HR 83

## 2024-03-04 DIAGNOSIS — G546 Phantom limb syndrome with pain: Secondary | ICD-10-CM | POA: Diagnosis not present

## 2024-03-04 DIAGNOSIS — I70209 Unspecified atherosclerosis of native arteries of extremities, unspecified extremity: Secondary | ICD-10-CM

## 2024-03-04 DIAGNOSIS — I10 Essential (primary) hypertension: Secondary | ICD-10-CM | POA: Diagnosis not present

## 2024-03-04 DIAGNOSIS — F32A Depression, unspecified: Secondary | ICD-10-CM | POA: Diagnosis not present

## 2024-03-04 DIAGNOSIS — Z0000 Encounter for general adult medical examination without abnormal findings: Secondary | ICD-10-CM | POA: Diagnosis not present

## 2024-03-04 DIAGNOSIS — Z7984 Long term (current) use of oral hypoglycemic drugs: Secondary | ICD-10-CM | POA: Diagnosis not present

## 2024-03-04 DIAGNOSIS — E1151 Type 2 diabetes mellitus with diabetic peripheral angiopathy without gangrene: Secondary | ICD-10-CM

## 2024-03-04 LAB — POCT GLYCOSYLATED HEMOGLOBIN (HGB A1C): HbA1c, POC (controlled diabetic range): 7.1 % — AB (ref 0.0–7.0)

## 2024-03-04 MED ORDER — DULOXETINE HCL 30 MG PO CPEP: 30.0000 mg | ORAL_CAPSULE | Freq: Every day | ORAL | 0 refills | Status: DC

## 2024-03-04 NOTE — Patient Instructions (Addendum)
 It was great to see you! Thank you for allowing me to participate in your care!  I recommend that you always bring your medications to each appointment as this makes it easy to ensure you are on the correct medications and helps [place] not miss when refills are needed.  Our plans for today:  - Continue current medications - Call to let me know whether or not you are taking benazepril - you will be called to schedule eye doctor apt   - Decrease the Prozac to 10 mg (1/2 tablet) starting today and take 10 mg once daily until Monday 03/11/24 then stop taking it completely  - Start Cymbalta 30 mg (1 capsule) on Thursday 03/14/24. After one week, if you tolerate this medication well, increase to 2 capsules (60 mg) daily -Return in 1-2 months for follow up or sooner if needed  Take care and seek immediate care sooner if you develop any concerns.   Dr. Erick Alley, DO [facility]

## 2024-03-04 NOTE — Progress Notes (Cosign Needed Addendum)
    SUBJECTIVE:   CHIEF COMPLAINT / HPI:   T2DM Taking metformin 1000 mg BID  HTN Currently taking benazepril 5 mg daily  Phantom limb pain Rates it at a 6/10.  Described as achy/burning/electric in missing limbs.  Worse at night time.  Taking gabapentin 600 mg TID   PERTINENT  PMH / PSH: T2DM, HTN, s/p bilateral AKA  OBJECTIVE:   BP 129/72   Pulse 83   SpO2 99%    General: NAD, pleasant, in wheelchair Cardiac: RRR, no murmurs. Respiratory: CTAB, normal effort, No wheezes, rales or rhonchi Abdomen: Bowel sounds present, nontender, nondistended, no hepatosplenomegaly. 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 -Continue gabapentin 600 mg 3 times daily -D/C fluoxetine and start Cymbalta, discussed how to safely cross taper as in AVS -Start Cymbalta at 30 mg, increase to 60 mg after 1 week if tolerated well -Return in 1 to 2 months for follow-up  Primary hypertension Well-controlled Continue benazepril 5 mg daily       Dr. Erick Alley, DO [place] [facility]

## 2024-03-04 NOTE — Assessment & Plan Note (Signed)
 Uncontrolled on gabapentin alone. -Continue gabapentin 600 mg 3 times daily -D/C fluoxetine and start Cymbalta, discussed how to safely cross taper as in AVS -Start Cymbalta at 30 mg, increase to 60 mg after 1 week if tolerated well -Return in 1 to 2 months for follow-up

## 2024-03-04 NOTE — Telephone Encounter (Signed)
 Patient calls nurse line in regards to Benazepril.  He reports he is home and confirmed he is taking Benazepril 5mg .   Will forward to PCP.

## 2024-03-04 NOTE — Progress Notes (Signed)
 Subjective:   [name] is a 73 y.o. who presents for a Medicare Wellness preventive visit.  Visit Complete: Virtual I connected with  [name] on 03/04/24 by a audio enabled telemedicine application and verified that I am speaking with the correct person using two identifiers.  Patient Location: Home  Provider Location: Office/Clinic  I discussed the limitations of evaluation and management by telemedicine. The patient expressed understanding and agreed to proceed.  Vital Signs: Because this visit was a virtual/telehealth visit, some criteria may be missing or patient reported. Any vitals not documented were not able to be obtained and vitals that have been documented are patient reported.  VideoDeclined- This patient declined Librarian, academic. Therefore the visit was completed with audio only.  AWV Questionnaire: No: Patient Medicare AWV questionnaire was not completed prior to this visit.  Cardiac Risk Factors include: advanced age (>3men, >35 women);diabetes mellitus;hypertension;male gender;smoking/ tobacco exposure;Other (see comment);family history of premature cardiovascular disease;dyslipidemia (CIGARS)     Objective:    Today's Vitals   03/04/24 1042 03/04/24 1043  Weight: 155 lb (70.3 kg)   Height: 5\' 7"  (1.702 m)   PainSc: 6  6   PainLoc: Leg    Body mass index is 24.28 kg/m.     03/04/2024   10:45 AM 11/13/2023    1:59 PM 09/27/2023   12:14 PM 09/21/2023    6:00 PM 08/16/2023    4:51 PM 08/09/2023    7:46 PM 07/27/2023    9:25 AM  Advanced Directives  Does Patient Have a Medical Advance Directive? No No No No No No No  Would patient like information on creating a medical advance directive? No - Patient declined No - Patient declined No - Patient declined No - Patient declined No - Patient declined No - Patient declined No - Patient declined    Current Medications (verified) Outpatient Encounter Medications as of  03/04/2024  Medication Sig   acetaminophen (TYLENOL) 325 MG tablet Take 1-2 tablets (325-650 mg total) by mouth every 4 (four) hours as needed for mild pain (or temp >/= 101 F).   albuterol (VENTOLIN HFA) 108 (90 Base) MCG/ACT inhaler TAKE 2 PUFFS BY MOUTH EVERY 6 HOURS AS NEEDED FOR WHEEZE OR SHORTNESS OF BREATH   ascorbic acid (VITAMIN C) 500 MG tablet Take 1 tablet (500 mg total) by mouth 2 (two) times daily. (Patient not taking: Reported on 11/13/2023)   aspirin EC 81 MG tablet Take 1 tablet (81 mg total) by mouth daily. Swallow whole.   atorvastatin (LIPITOR) 40 MG tablet TAKE 1 TABLET BY MOUTH EVERY DAY   Baclofen 5 MG TABS Take 1 tablet (5 mg total) by mouth 3 (three) times daily. (Patient not taking: Reported on 11/13/2023)   docusate sodium (COLACE) 100 MG capsule Take 1 capsule (100 mg total) by mouth daily. (Patient not taking: Reported on 11/13/2023)   ezetimibe (ZETIA) 10 MG tablet TAKE 1 TABLET BY MOUTH EVERY DAY   feeding supplement (ENSURE ENLIVE / ENSURE PLUS) LIQD Take 237 mLs by mouth 2 (two) times daily between meals.   ferrous sulfate 325 (65 FE) MG tablet Take 1 tablet (325 mg total) by mouth every other day.   FLUoxetine (PROZAC) 20 MG tablet Take 1 tablet (20 mg total) by mouth daily.   gabapentin (NEURONTIN) 300 MG capsule Take 2 capsules (600 mg total) by mouth 3 (three) times daily.   melatonin 3 MG TABS tablet Take 2 tablets (6 mg total) by  mouth at bedtime.   metFORMIN (GLUCOPHAGE) 1000 MG tablet Take 1 tablet (1,000 mg total) by mouth 2 (two) times daily with a meal.   nicotine (NICODERM CQ - DOSED IN MG/24 HR) 7 mg/24hr patch Place 1 patch (7 mg total) onto the skin daily.   oxyCODONE (ROXICODONE) 5 MG immediate release tablet Take 1 tablet (5 mg total) by mouth every 6 (six) hours as needed for severe pain (pain score 7-10). (Patient not taking: Reported on 11/13/2023)   pantoprazole (PROTONIX) 40 MG tablet TAKE 1 TABLET BY MOUTH EVERY DAY AS NEEDED   polyethylene  glycol (MIRALAX / GLYCOLAX) 17 g packet Take 17 g by mouth daily as needed.   senna (SENOKOT) 8.6 MG TABS tablet Take 1 tablet (8.6 mg total) by mouth daily. (Patient not taking: Reported on 11/13/2023)   Zinc Sulfate 220 (50 Zn) MG TABS Take 1 tablet (220 mg total) by mouth daily. (Patient not taking: Reported on 11/13/2023)   No facility-administered encounter medications on file as of 03/04/2024.    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   Critical lower limb ischemia (HCC) 12/19/2019   Diabetes mellitus    takes Metformin daily   GERD (gastroesophageal reflux disease)    takes Protonix daily   History of blood transfusion    "related to low HgB" ((09/10/2015   Hyperlipidemia    takes Vytorin daily   Hypertension    takes Benazepril and Bystolic daily   PAD (peripheral artery disease) (HCC)    Pneumonia    Radiculopathy, cervical region 02/11/2017   Shortness of breath dyspnea    with exertion   Tobacco user    Toe fracture, right 05/09/2011   Wears glasses    Past Surgical History:  Procedure Laterality Date   ABDOMINAL AORTOGRAM W/LOWER EXTREMITY N/A 12/11/2017   Procedure: ABDOMINAL AORTOGRAM W/LOWER EXTREMITY;  Surgeon: Chuck Hint, MD;  Location: [facility] INVASIVE CV LAB;  Service: Cardiovascular;  Laterality: N/A;   ABDOMINAL AORTOGRAM W/LOWER EXTREMITY Bilateral 04/22/2020   Procedure: ABDOMINAL AORTOGRAM W/LOWER EXTREMITY;  Surgeon: Cephus Shelling, MD;  Location: [facility] INVASIVE CV LAB;  Service: Cardiovascular;  Laterality: Bilateral;   ABDOMINAL AORTOGRAM W/LOWER EXTREMITY N/A 07/27/2023   Procedure: ABDOMINAL AORTOGRAM  W/LOWER EXTREMITY;  Surgeon: Cephus Shelling, MD;  Location: [facility] INVASIVE CV LAB;  Service: Cardiovascular;  Laterality: N/A;   ABDOMINAL AORTOGRAM W/LOWER EXTREMITY Right 09/25/2023   Procedure: ABDOMINAL AORTOGRAM W/LOWER EXTREMITY;  Surgeon: Maeola Harman, MD;  Location: [facility] INVASIVE CV LAB;  Service: Cardiovascular;  Laterality: Right;   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   AMPUTATION Left 02/19/2021   Procedure: LEFT ABOVE KNEE AMPUTATION;  Surgeon: Leonie Douglas, MD;  Location: [facility] OR;  Service: Vascular;  Laterality: Left;   AMPUTATION Right 09/27/2023   Procedure: RIGHT AMPUTATION ABOVE KNEE;  Surgeon: Cephus Shelling, MD;  Location: [facility] OR;  Service: Vascular;  Laterality: Right;   ANTERIOR CERVICAL DECOMP/DISCECTOMY FUSION  03/08/12   C6-7   ANTERIOR CERVICAL DECOMP/DISCECTOMY FUSION  03/08/2012   Procedure: ANTERIOR CERVICAL DECOMPRESSION/DISCECTOMY FUSION 1 LEVEL/HARDWARE REMOVAL;  Surgeon: Maeola Harman, MD;  Location: [facility]  NEURO ORS;  Service: Neurosurgery;  Laterality: N/A;  revison of C5-7 anterior cervical decompression with fusion with Cervical Five-Thoracic One anterior cervical decompression with fusion with interbody prothesis plating and bonegraft   AORTIC ARCH ANGIOGRAPHY N/A 09/25/2023   Procedure: AORTIC ARCH ANGIOGRAPHY;  Surgeon: Maeola Harman, MD;  Location: [facility] INVASIVE CV LAB;  Service: Cardiovascular;  Laterality: N/A;   BACK SURGERY  1996   BIOPSY  12/05/2020   Procedure: BIOPSY;  Surgeon: Meryl Dare, MD;  Location: [facility] ENDOSCOPY;  Service: Endoscopy;;   BYPASS GRAFT FEMORAL-PERONEAL Left 11/11/2016   Procedure: REDO LEFT FEMORAL-PERONEAL BYPASS WITH PROPATEN X 80CM GRAFT;  Surgeon: Chuck Hint, MD;  Location: [facility] OR;  Service: Vascular;  Laterality: Left;   BYPASS GRAFT FEMORAL-PERONEAL Right 08/09/2023   Procedure: RIGHT  FEMORAL  ARTERY TO PERONEAL ARTERY BYPASS GRAFT USING GREATER SAPHENOUS VEIN;  Surgeon: Cephus Shelling, MD;  Location: [facility] OR;  Service: Vascular;  Laterality: Right;   COLONOSCOPY W/ BIOPSIES AND POLYPECTOMY  08/17/2012   f/u 5 years, 4 polyps, no high grade dysplasia or malignancy, tubular adenoma, hyperplastic polyops   COLONOSCOPY WITH PROPOFOL N/A 12/05/2020   Procedure: COLONOSCOPY WITH PROPOFOL;  Surgeon: Meryl Dare, MD;  Location: [facility] ENDOSCOPY;  Service: Endoscopy;  Laterality: N/A;   EMBOLECTOMY Left 12/19/2019   Procedure: LEFT FEMERAL- PERONEAL THROMBECTOMY;  Surgeon: Cephus Shelling, MD;  Location: [facility] OR;  Service: Vascular;  Laterality: Left;   ENDARTERECTOMY FEMORAL Right 04/24/2020   Procedure: RIGHT FEMORAL ENDARTERECTOMY;  Surgeon: Cephus Shelling, MD;  Location: [facility] OR;  Service: Vascular;  Laterality: Right;   ENTEROSCOPY N/A 12/11/2015   Procedure: ENTEROSCOPY;  Surgeon: Jeani Hawking, MD;  Location: [facility] ENDOSCOPY;  Service: Endoscopy;  Laterality: N/A;   ENTEROSCOPY N/A 03/29/2018   Procedure: ENTEROSCOPY;  Surgeon: Jeani Hawking, MD;  Location: [facility] ENDOSCOPY;  Service: Endoscopy;  Laterality: N/A;   ESOPHAGOGASTRODUODENOSCOPY  08/17/2012   normal esophagus and GEJ, diffuse gastritis with erythema- no malignancy, reactive gastropathy  with focal intestinal metaplasia   ESOPHAGOGASTRODUODENOSCOPY (EGD) WITH PROPOFOL N/A 12/05/2020   Procedure: ESOPHAGOGASTRODUODENOSCOPY (EGD) WITH PROPOFOL;  Surgeon: Meryl Dare, MD;  Location: [facility] ENDOSCOPY;  Service: Endoscopy;  Laterality: N/A;   FEMORAL-POPLITEAL BYPASS GRAFT Left 01/06/2016   Procedure: Left  COMMON FEMORAL-BELOW KNEE POPLITEAL ARTERY Bypass using non-reversed translocated saphenous vein graft from left leg;  Surgeon: Pryor Ochoa, MD;  Location: [facility] OR;  Service: Vascular;  Laterality: Left;   GIVENS CAPSULE STUDY N/A 11/24/2015   Procedure: GIVENS CAPSULE STUDY;  Surgeon: Charna Elizabeth, MD;  Location: [facility] ENDOSCOPY;  Service: Endoscopy;  Laterality: N/A;   HOT HEMOSTASIS N/A 03/29/2018   Procedure:  HOT HEMOSTASIS (ARGON PLASMA COAGULATION/BICAP);  Surgeon: Jeani Hawking, MD;  Location: [facility] ENDOSCOPY;  Service: Endoscopy;  Laterality: N/A;   INGUINAL HERNIA REPAIR  1990's   right   INTRAOPERATIVE ARTERIOGRAM Left 01/06/2016   Procedure: INTRA OPERATIVE ARTERIOGRAM LEFT LOWER LEG;  Surgeon: Pryor Ochoa, MD;  Location: [facility] OR;  Service: Vascular;  Laterality: Left;   INTRAOPERATIVE ARTERIOGRAM Left 11/11/2016   Procedure: INTRA OPERATIVE ARTERIOGRAM LEFT LOWER EXTRIMITY;  Surgeon: Chuck Hint, MD;  Location: [facility] OR;  Service: Vascular;  Laterality: Left;   IR ANGIOGRAM FOLLOW UP STUDY  12/11/2017   IR GENERIC HISTORICAL  10/24/2016   IR ANGIOGRAM FOLLOW UP STUDY   LOWER EXTREMITY ANGIOGRAM Bilateral 07/30/2015   Procedure: Lower Extremity Angiogram;  Surgeon: Fransisco Hertz, MD;  Location: [facility]  INVASIVE CV LAB;  Service: Cardiovascular;  Laterality: Bilateral;   LUMBAR DISC SURGERY  1996   "lower"   PATCH ANGIOPLASTY Left 12/19/2019   Procedure: LEFT FEMORAL -PERONEAL PATCH ANGIOPLASTY WITH XENOSURE BIOLOGIC PATCH  ;  Surgeon: Cephus Shelling, MD;  Location: [facility] OR;  Service: Vascular;  Laterality: Left;   PATCH ANGIOPLASTY Right 04/24/2020   Procedure: Patch Angioplasty of Right Femoral Artery using Long Xenosure Biologic Patch 1cm x 14 cm;  Surgeon: Cephus Shelling, MD;  Location: [facility] OR;  Service: Vascular;  Laterality: Right;   PERIPHERAL VASCULAR CATHETERIZATION N/A 07/30/2015   Procedure: Abdominal Aortogram;  Surgeon: Fransisco Hertz, MD;  Location: [facility] INVASIVE CV LAB;  Service: Cardiovascular;  Laterality: N/A;   PERIPHERAL VASCULAR CATHETERIZATION N/A 10/24/2016   Procedure: Abdominal Aortogram;  Surgeon: Chuck Hint, MD;  Location: [facility] INVASIVE CV LAB;  Service: Cardiovascular;  Laterality: N/A;   PERIPHERAL VASCULAR CATHETERIZATION N/A 10/24/2016   Procedure: Lower Extremity Angiography;  Surgeon: Chuck Hint, MD;  Location: [facility] INVASIVE CV LAB;  Service:  Cardiovascular;  Laterality: N/A;   PERIPHERAL VASCULAR INTERVENTION Right 12/11/2017   Procedure: PERIPHERAL VASCULAR INTERVENTION;  Surgeon: Chuck Hint, MD;  Location: [facility] INVASIVE CV LAB;  Service: Cardiovascular;  Laterality: Right;   PERIPHERAL VASCULAR INTERVENTION Right 07/27/2023   Procedure: PERIPHERAL VASCULAR INTERVENTION;  Surgeon: Cephus Shelling, MD;  Location: [facility] INVASIVE CV LAB;  Service: Cardiovascular;  Laterality: Right;  Common Illiac   UPPER EXTREMITY ANGIOGRAPHY  09/25/2023   Procedure: Upper Extremity Angiography;  Surgeon: Maeola Harman, MD;  Location: [facility] INVASIVE CV LAB;  Service: Cardiovascular;;   VASCULAR SURGERY  ~ 2007   Stent SFA    VEIN HARVEST Left 01/06/2016   Procedure: LEFT GREATER SAPPHENOUS VEIN HARVEST;  Surgeon: Pryor Ochoa, MD;  Location: [facility] OR;  Service: Vascular;  Laterality: Left;   VEIN HARVEST Right 08/09/2023   Procedure: RIGHT GREATER SAPHENOUS VEIN HARVEST;  Surgeon: Cephus Shelling, MD;  Location: [facility] OR;  Service: Vascular;  Laterality: Right;   Family History  Problem Relation Age of Onset   Cancer Mother        colon Cancer   Hyperlipidemia Mother    Diabetes Mother    Heart disease Father    Hypertension Father    Cancer Sister        Uterine   Diabetes Sister    Diabetes Brother    Social History   Socioeconomic History   Marital status: Widowed    Spouse name: Not on file   Number of children: 3   Years of education: 14   Highest education level: Some college, no degree  Occupational History   Occupation: Disable  Tobacco Use   Smoking status: Some Days    Types: Cigars    Passive exposure: Current   Smokeless tobacco: Never  Vaping Use   Vaping status: Never Used  Substance and Sexual Activity   Alcohol use: Yes    Alcohol/week: 2.0 standard drinks of alcohol    Types: 2 Cans of beer per week   Drug use: No   Sexual activity: Yes  Other Topics Concern   Not on file  Social  History Narrative   Lives alone. Lives in an apartment. Ground floor apartment. Smoke alarms present, no grab bars.    Has a shih-tzu, named Missy.    Patient eats a variety of foods. Drinks tea, Kool-aid, coffee      Use public transportation  or brother will take places.   Some food insecurity.      Social Drivers of Corporate investment banker Strain: Low Risk  (03/04/2024)   Overall Financial Resource Strain (CARDIA)    Difficulty of Paying Living Expenses: Not hard at all  Food Insecurity: No Food Insecurity (03/04/2024)   Hunger Vital Sign    Worried About Running Out of Food in the Last Year: Never true    Ran Out of Food in the Last Year: Never true  Transportation Needs: Unmet Transportation Needs (03/04/2024)   PRAPARE - Administrator, Civil Service (Medical): Yes    Lack of Transportation (Non-Medical): Yes  Physical Activity: Insufficiently Active (03/04/2024)   Exercise Vital Sign    Days of Exercise per Week: 7 days    Minutes of Exercise per Session: 10 min  Stress: No Stress Concern Present (03/04/2024)   [employer] of Occupational Health - Occupational Stress Questionnaire    Feeling of Stress : Not at all  Social Connections: Moderately Isolated (03/04/2024)   Social Connection and Isolation Panel [NHANES]    Frequency of Communication with Friends and Family: More than three times a week    Frequency of Social Gatherings with Friends and Family: Never    Attends Religious Services: Never    Database administrator or Organizations: No    Attends Engineer, structural: Never    Marital Status: Living with partner    Tobacco Counseling Ready to quit: Not Answered Counseling given: Not Answered    Clinical Intake:  Pre-visit preparation completed: Yes  Pain : 0-10 Pain Score: 6  Pain Type: Phantom pain Pain Location: Leg Pain Orientation: Right, Left     BMI - recorded: 24.28 Nutritional Status: BMI of 19-24   Normal Nutritional Risks: None Diabetes: Yes CBG done?: No Did pt. bring in CBG monitor from home?: No  How often do you need to have someone help you when you [name] instructions, pamphlets, or other written materials from your doctor or pharmacy?: 1 - Never What is the last grade level you completed in school?: HSG  Interpreter Needed?: No  Information entered by :: Susie Cassette, LPN.   Activities of Daily Living     03/04/2024   10:58 AM 09/27/2023    1:00 AM  In your present state of health, do you have any difficulty performing the following activities:  Hearing? 0 0  Vision? 0   Difficulty concentrating or making decisions? 1   Walking or climbing stairs? 0   Dressing or bathing? 0   Doing errands, shopping? 0   Preparing Food and eating ? N   Using the Toilet? N   In the past six months, have you accidently leaked urine? N   Do you have problems with loss of bowel control? N   Managing your Medications? N   Managing your Finances? N   Housekeeping or managing your Housekeeping? N     Patient Care Team: Erick Alley, DO as PCP - General (Family Medicine) Jeani Hawking, MD as Consulting Physician (Gastroenterology) Charna Elizabeth, MD as Consulting Physician (Gastroenterology) Mateo Flow, MD as Consulting Physician (Ophthalmology) Chuck Hint, MD (Inactive) as Consulting Physician (Vascular Surgery)  Indicate any recent Medical Services you may have received from other than [facility] providers in the past year (date may be approximate).     Assessment:   This is a routine wellness examination for [place].  Hearing/Vision screen Hearing Screening - Comments:: Denies  hearing difficulties. No hearing aids.   Vision Screening - Comments:: Wears rx glasses - up to date with routine eye exams with Dr. Nile Riggs (retired); In the process of finding a new eye doctor.    Goals Addressed   None    Depression Screen     03/04/2024   10:49 AM 11/13/2023     1:58 PM 06/14/2023   11:33 AM 02/23/2023    9:57 AM 02/10/2023   10:11 AM 10/06/2022    8:59 AM 10/05/2022    9:32 AM  PHQ 2/9 Scores  PHQ - 2 Score 0 0 0 0 1 0 0  PHQ- 9 Score 0 0 0 3  0     Fall Risk     03/04/2024   10:45 AM 11/13/2023    1:58 PM 02/10/2023   10:12 AM 10/06/2022    8:59 AM 09/05/2022    9:27 AM  Fall Risk   Falls in the past year? 0 0 1 1 0  Number falls in past yr: 0 0 1 1 0  Injury with Fall? 0 0 1 1 0  Risk for fall due to : No Fall Risks Other (Comment) Impaired vision;Impaired balance/gait Impaired balance/gait;History of fall(s);Impaired mobility   Follow up Falls prevention discussed;Falls evaluation completed Falls evaluation completed Falls evaluation completed Falls evaluation completed;Education provided;Falls prevention discussed     MEDICARE RISK AT HOME:  Medicare Risk at Home Any stairs in or around the home?: No If so, are there any without handrails?: No Home free of loose throw rugs in walkways, pet beds, electrical cords, etc?: Yes Adequate lighting in your home to reduce risk of falls?: Yes Life alert?: No Use of a cane, walker or w/c?: Yes Grab bars in the bathroom?: No Shower chair or bench in shower?: Yes Elevated toilet seat or a handicapped toilet?: Yes  TIMED UP AND GO:  Was the test performed?  No  Cognitive Function: 6CIT completed    03/04/2024   10:47 AM 11/08/2018    3:45 PM  MMSE - Mini Mental State Exam  Not completed: Unable to complete   Orientation to time  5  Orientation to Place  5  Registration  3  Attention/ Calculation  5  Recall  3  Language- name 2 objects  2  Language- repeat  1  Language- follow 3 step command  3  Language- [name] & follow direction  1  Write a sentence  1  Copy design  1  Total score  30        03/04/2024   10:47 AM 02/10/2023   10:14 AM 11/08/2018    3:46 PM  6CIT Screen  What Year? 0 points 0 points 0 points  What month? 0 points 0 points 0 points  What time? 0 points 0  points 0 points  Count back from 20 0 points 0 points 0 points  Months in reverse 0 points 4 points 0 points  Repeat phrase 0 points 2 points 0 points  Total Score 0 points 6 points 0 points    Immunizations Immunization History  Administered Date(s) Administered   Influenza Split 11/30/2011, 11/07/2012   Influenza Whole 10/31/2007, 10/23/2008, 11/11/2009, 10/01/2010   Influenza, High Dose Seasonal PF 08/17/2022, 09/15/2023   Influenza,inj,Quad PF,6+ Mos 08/27/2014, 09/11/2015, 09/13/2016, 10/09/2017, 11/08/2018, 08/22/2019, 10/05/2020   Influenza-Unspecified 09/25/2013, 10/08/2021   PFIZER Comirnaty(Gray Top)Covid-19 Tri-Sucrose Vaccine 05/04/2021   PFIZER(Purple Top)SARS-COV-2 Vaccination 01/31/2020, 02/21/2020, 10/05/2020   Pfizer Covid-19 Vaccine Bivalent Booster 68yrs &  up 10/08/2021, 10/01/2022, 09/15/2023   Pneumococcal Conjugate-13 09/13/2016   Pneumococcal Polysaccharide-23 10/01/2010, 10/09/2017   Td 12/26/2001   Tdap 01/18/2012   Zoster Recombinant(Shingrix) 08/17/2022, 06/13/2023    Screening Tests Health Maintenance  Topic Date Due   DTaP/Tdap/Td (3 - Td or Tdap) 01/17/2022   OPHTHALMOLOGY EXAM  01/22/2022   Diabetic kidney evaluation - Urine ACR  10/07/2023   COVID-19 Vaccine (8 - 2024-25 season) 11/10/2023   HEMOGLOBIN A1C  05/12/2024   Diabetic kidney evaluation - eGFR measurement  09/28/2024   Medicare Annual Wellness (AWV)  03/04/2025   Colonoscopy  12/05/2025   Pneumonia Vaccine 53+ Years old  Completed   INFLUENZA VACCINE  Completed   Hepatitis C Screening  Completed   Zoster Vaccines- Shingrix  Completed   HPV VACCINES  Aged Out   FOOT EXAM  Discontinued    Health Maintenance  Health Maintenance Due  Topic Date Due   DTaP/Tdap/Td (3 - Td or Tdap) 01/17/2022   OPHTHALMOLOGY EXAM  01/22/2022   Diabetic kidney evaluation - Urine ACR  10/07/2023   COVID-19 Vaccine (8 - 2024-25 season) 11/10/2023   Health Maintenance Items Addressed: Yes; This  patient is due for a diabetic eye exam, Dtap and Covid Vaccines.  Additional Screening:  Vision Screening: Recommended annual ophthalmology exams for early detection of glaucoma and other disorders of the eye.  Dental Screening: Recommended annual dental exams for proper oral hygiene  Community Resource Referral / Chronic Care Management: CRR required this visit?  No   CCM required this visit?  No     Plan:     I have personally reviewed and noted the following in the patient's chart:   Medical and social history Use of alcohol, tobacco or illicit drugs  Current medications and supplements including opioid prescriptions. Patient is currently taking opioid prescriptions. Information provided to patient regarding non-opioid alternatives. Patient advised to discuss non-opioid treatment plan with their provider. Functional ability and status Nutritional status Physical activity Advanced directives List of other physicians Hospitalizations, surgeries, and ER visits in previous 12 months Vitals Screenings to include cognitive, depression, and falls Referrals and appointments  In addition, I have reviewed and discussed with patient certain preventive protocols, quality metrics, and best practice recommendations. A written personalized care plan for preventive services as well as general preventive health recommendations were provided to patient.     Mickeal Needy, LPN   04/03/8118   After Visit Summary: (MyChart) Due to this being a telephonic visit, the after visit summary with patients personalized plan was offered to patient via MyChart   Notes: Please refer to Routing Comments.

## 2024-03-04 NOTE — Patient Instructions (Signed)
 [name] , Thank you for taking time to come for your Medicare Wellness Visit. I appreciate your ongoing commitment to your health goals. Please review the following plan we discussed and let me know if I can assist you in the future.   Referrals/Orders/Follow-Ups/Clinician Recommendations: Yes; Keep maintaining your health by keeping your appointments with Dr. Yetta Barre and any specialists that you may see.  Call us if you need anything.  Have a great year!!!!  This is a list of the screening recommended for you and due dates:  Health Maintenance  Topic Date Due   DTaP/Tdap/Td vaccine (3 - Td or Tdap) 01/17/2022   Eye exam for diabetics  01/22/2022   Yearly kidney health urinalysis for diabetes  10/07/2023   COVID-19 Vaccine (8 - 2024-25 season) 11/10/2023   Hemoglobin A1C  05/12/2024   Yearly kidney function blood test for diabetes  09/28/2024   Medicare Annual Wellness Visit  03/04/2025   Colon Cancer Screening  12/05/2025   Pneumonia Vaccine  Completed   Flu Shot  Completed   Hepatitis C Screening  Completed   Zoster (Shingles) Vaccine  Completed   HPV Vaccine  Aged Out   Complete foot exam   Discontinued    Advanced directives: (Declined) Advance directive discussed with you today. Even though you declined this today, please call our office should you change your mind, and we can give you the proper paperwork for you to fill out.  Next Medicare Annual Wellness Visit scheduled for next year: Yes

## 2024-03-04 NOTE — Assessment & Plan Note (Signed)
 A1c close to goal at 7.1 [facility] referral for DM eye exam  -Continue metformin 1000 mg twice daily -Next A1c in 3 months

## 2024-03-04 NOTE — Assessment & Plan Note (Addendum)
 Well-controlled Continue benazepril 5 mg daily

## 2024-03-05 ENCOUNTER — Encounter: Payer: Self-pay | Admitting: Student

## 2024-03-05 LAB — MICROALBUMIN / CREATININE URINE RATIO
Creatinine, Urine: 113.1 mg/dL
Microalb/Creat Ratio: 9 mg/g{creat} (ref 0–29)
Microalbumin, Urine: 10.7 ug/mL

## 2024-03-08 ENCOUNTER — Other Ambulatory Visit: Payer: Self-pay | Admitting: Student

## 2024-03-08 DIAGNOSIS — I10 Essential (primary) hypertension: Secondary | ICD-10-CM

## 2024-03-08 DIAGNOSIS — M25511 Pain in right shoulder: Secondary | ICD-10-CM

## 2024-03-08 DIAGNOSIS — E7800 Pure hypercholesterolemia, unspecified: Secondary | ICD-10-CM

## 2024-03-14 ENCOUNTER — Telehealth: Payer: Self-pay

## 2024-03-14 NOTE — Telephone Encounter (Signed)
 Patient LVM on nurse line requesting a refill on Clopidogrel.   I see this was denied earlier this week and is no longer on his medication list.   Will forward to PCP for clarification.

## 2024-03-19 NOTE — Telephone Encounter (Signed)
 Discussed with patient.   He reports he is only taking Aspirin.

## 2024-04-22 ENCOUNTER — Other Ambulatory Visit: Payer: Self-pay | Admitting: Student

## 2024-04-22 DIAGNOSIS — I10 Essential (primary) hypertension: Secondary | ICD-10-CM

## 2024-04-23 ENCOUNTER — Telehealth: Payer: Self-pay

## 2024-04-23 NOTE — Telephone Encounter (Signed)
 Patient calls nurse line in regards to Atorvastatin .   He reports he received a letter stating this medication has been recalled and to call the number provided.  I called [facility] to inquire more on recall. Per pharmacist, they have not "heard" of any recalls on Atorvastatin . He reports they would not send out a letter, however unsure if manufacture did. He asks the patient bring the letter for his review.   I called patient. He reports he will take the letter to [facility].   All questions answered.

## 2024-05-14 ENCOUNTER — Other Ambulatory Visit: Payer: Self-pay

## 2024-05-14 NOTE — Patient Outreach (Signed)
 Aging Gracefully Program  05/14/2024  [name] [birth year] [id number]   [facility] Evaluation Interviewer made contact with patient. Aging Gracefully survey completed.   Interviewer will send referral to RN and OT for follow up.    [facility]  Population Health Care Management Assistant  Direct Dial: [phone]  Fax: [phone] Website: [email or web address]

## 2024-05-17 ENCOUNTER — Encounter: Payer: Self-pay | Admitting: Student

## 2024-05-17 ENCOUNTER — Other Ambulatory Visit (INDEPENDENT_AMBULATORY_CARE_PROVIDER_SITE_OTHER)

## 2024-05-17 ENCOUNTER — Ambulatory Visit (INDEPENDENT_AMBULATORY_CARE_PROVIDER_SITE_OTHER): Admitting: Student

## 2024-05-17 VITALS — BP 112/80 | HR 98 | Ht 67.0 in

## 2024-05-17 DIAGNOSIS — K5900 Constipation, unspecified: Secondary | ICD-10-CM | POA: Diagnosis not present

## 2024-05-17 DIAGNOSIS — F32A Depression, unspecified: Secondary | ICD-10-CM

## 2024-05-17 DIAGNOSIS — R1013 Epigastric pain: Secondary | ICD-10-CM | POA: Diagnosis not present

## 2024-05-17 DIAGNOSIS — G546 Phantom limb syndrome with pain: Secondary | ICD-10-CM

## 2024-05-17 LAB — POCT UA - MICROSCOPIC ONLY: RBC, Urine, Miroscopic: NONE SEEN (ref 0–2)

## 2024-05-17 LAB — POCT URINALYSIS DIP (MANUAL ENTRY)
Bilirubin, UA: NEGATIVE
Blood, UA: NEGATIVE
Glucose, UA: NEGATIVE mg/dL
Ketones, POC UA: NEGATIVE mg/dL
Nitrite, UA: POSITIVE — AB
Spec Grav, UA: 1.025 (ref 1.010–1.025)
Urobilinogen, UA: 0.2 U/dL
pH, UA: 5.5 (ref 5.0–8.0)

## 2024-05-17 MED ORDER — POLYETHYLENE GLYCOL 3350 17 G PO PACK: 17.0000 g | PACK | Freq: Every day | ORAL | 1 refills | Status: AC | PRN

## 2024-05-17 MED ORDER — SUCRALFATE 1 G PO TABS
1.0000 g | ORAL_TABLET | Freq: Three times a day (TID) | ORAL | 1 refills | Status: AC
Start: 2024-05-17 — End: ?

## 2024-05-17 MED ORDER — PANTOPRAZOLE SODIUM 40 MG PO TBEC
80.0000 mg | DELAYED_RELEASE_TABLET | Freq: Every day | ORAL | 0 refills | Status: DC
Start: 2024-05-17 — End: 2024-10-29

## 2024-05-17 MED ORDER — DULOXETINE HCL 60 MG PO CPEP: 60.0000 mg | ORAL_CAPSULE | Freq: Every day | ORAL | 3 refills | Status: DC

## 2024-05-17 NOTE — Progress Notes (Cosign Needed Addendum)
    SUBJECTIVE:   CHIEF COMPLAINT / HPI:   Abdominal pain Pt reports constant L sided flank and upper L sided stomach pain for past 4 days which is significantly worse with eating and taking medications.  He states the pain starts in the left side radiates to the upper left quadrant and epigastric area.  Hasn't been able to eat much the past few days but able to stay hydrated with fluids.  Has been able to eat fruit cups and noodles.  Notes mild improvement in pain with passing gas. No fevers, nausea, vomiting or diarrhea. Has BMs every other day with straining and hard pebble like stools. No blood in stools. States he is taking an OTC stool softener but not taking miralax .  He denies dysuria or blood in urine.  Patient brought medications with him to be reviewed.  He is currently taking both fluoxetine  and duloxetine .  At last visit he was instructed to taper off of fluoxetine  and transition to duloxetine  to help with phantom limb pain in addition to depression.   PERTINENT  PMH / PSH:  double lower extremity amputee, phantom limb pain HTN, T2DM, tobacco use, IDA, depression  OBJECTIVE:   BP 112/80   Pulse 98   Ht 5\' 7"  (1.702 m)   SpO2 96%   BMI 24.28 kg/m    General: NAD, pleasant, well-appearing Cardiac: RRR, no murmurs. Respiratory: CTAB, normal effort, No wheezes, rales or rhonchi Abdomen: Bowel sounds present, nondistended, soft, mild TTP of epigastric area without guarding, no flank pain Neuro: alert, no obvious focal deficits Psych: Normal affect and mood  ASSESSMENT/PLAN:   Phantom limb pain (HCC) Will discard of fluoxetine . Increase duloxetine  to 60 mg, new Rx sent to pharmacy  Epigastric pain Pain radiates from left side to epigastric area and significantly worsened with eating and taking medications. On chart review he had very similarly described pain in February 2024 with subsequently nonconcerning RUQ. He previously followed with GI and had EGD and colonoscopy  done in 2021 showing internal hemorrhoids with plan for next colonoscopy to be in 2026. No flank tenderness suggestive of pyelonephritis and no dysuria.  UA without evidence of stones, does show nitrites and leukocytes however there are epithelial cells present and no pyuria which is reassuring against UTI. He does endorse constipation which could cause abdominal discomfort.  Other concerning differential is gastric ulcers or pancreatic pathology. Liver/gal bladder pathology less likely given no right sided discomfort but still a possibility.  Reassuringly, vitals are stable and he does not have an acute abdomen on exam. -Increase Protonix  to 80 mg daily - Rx Carafate - MiraLAX  daily, can increase to twice daily if needed  - Schedule follow-up appointment in the next 2 weeks, ED precautions given - Will place referral to GI to consider repeat EGD - CBC w/ diff, CMP, lipase      Dr. Glenn Lange, DO [facility]

## 2024-05-17 NOTE — Assessment & Plan Note (Signed)
 Will discard of fluoxetine . Increase duloxetine  to 60 mg, new Rx sent to pharmacy

## 2024-05-17 NOTE — Assessment & Plan Note (Addendum)
 Pain radiates from left side to epigastric area and significantly worsened with eating and taking medications. On chart review he had very similarly described pain in February 2024 with subsequently nonconcerning RUQ. He previously followed with GI and had EGD and colonoscopy done in 2021 showing internal hemorrhoids with plan for next colonoscopy to be in 2026. No flank tenderness suggestive of pyelonephritis and no dysuria.  UA without evidence of stones, does show nitrites and leukocytes however there are epithelial cells present and no pyuria which is reassuring against UTI. He does endorse constipation which could cause abdominal discomfort.  Other concerning differential is gastric ulcers or pancreatic pathology. Liver/gal bladder pathology less likely given no right sided discomfort but still a possibility.  Reassuringly, vitals are stable and he does not have an acute abdomen on exam. -Increase Protonix  to 80 mg daily - Rx Carafate - MiraLAX  daily, can increase to twice daily if needed  - Schedule follow-up appointment in the next 2 weeks, ED precautions given - Will place referral to GI to consider repeat EGD - CBC w/ diff, CMP, lipase

## 2024-05-17 NOTE — Patient Instructions (Signed)
 It was great to see you! Thank you for allowing me to participate in your care!  I recommend that you always bring your medications to each appointment as this makes it easy to ensure you are on the correct medications and helps us  not miss when refills are needed.  Our plans for today:  - Call GI to schedule an appointment, if they require a referral please let me know: [phone]  - Start miralax  1 capful daily with a goal of having 1 soft bowel movement a day without straining.  You can increase to 1 capful twice a day if needed.  It can take up to 3 days for MiraLAX  to be effective - Increase Cymbalta  to 60 mg daily, this has been sent to your pharmacy - I recommend going to your pharmacist to discuss the recall on atorvastatin .  I do think it is safe at this time to continue with the medication you have. - Return for follow-up in 2 weeks or sooner if needed or go to the ED if you have severe pain or unable to keep food down  We are checking some labs today, I will call you if they are abnormal will send you a MyChart message or a letter if they are normal.  If you do not hear about your labs in the next 2 weeks please let us  know.  Take care and seek immediate care sooner if you develop any concerns.   Dr. Glenn Lange, DO [facility]

## 2024-05-18 LAB — COMPREHENSIVE METABOLIC PANEL WITH GFR
ALT: 10 IU/L (ref 0–44)
AST: 13 IU/L (ref 0–40)
Albumin: 4.4 g/dL (ref 3.8–4.8)
Alkaline Phosphatase: 115 IU/L (ref 44–121)
BUN/Creatinine Ratio: 11 (ref 10–24)
BUN: 9 mg/dL (ref 8–27)
Bilirubin Total: 0.2 mg/dL (ref 0.0–1.2)
CO2: 20 mmol/L (ref 20–29)
Calcium: 9.6 mg/dL (ref 8.6–10.2)
Chloride: 98 mmol/L (ref 96–106)
Creatinine, Ser: 0.83 mg/dL (ref 0.76–1.27)
Globulin, Total: 3 g/dL (ref 1.5–4.5)
Glucose: 116 mg/dL — ABNORMAL HIGH (ref 70–99)
Potassium: 4.3 mmol/L (ref 3.5–5.2)
Sodium: 139 mmol/L (ref 134–144)
Total Protein: 7.4 g/dL (ref 6.0–8.5)
eGFR: 92 mL/min/{1.73_m2} (ref >59)

## 2024-05-18 LAB — CBC WITH DIFFERENTIAL/PLATELET
Basophils Absolute: 0 10*3/uL (ref 0.0–0.2)
Basos: 1 %
EOS (ABSOLUTE): 0.1 10*3/uL (ref 0.0–0.4)
Eos: 2 %
Hematocrit: 47.1 % (ref 37.5–51.0)
Hemoglobin: 13.8 g/dL (ref 13.0–17.7)
Immature Grans (Abs): 0 10*3/uL (ref 0.0–0.1)
Immature Granulocytes: 0 %
Lymphocytes Absolute: 1.9 10*3/uL (ref 0.7–3.1)
Lymphs: 32 %
MCH: 21.6 pg — ABNORMAL LOW (ref 26.6–33.0)
MCHC: 29.3 g/dL — ABNORMAL LOW (ref 31.5–35.7)
MCV: 74 fL — ABNORMAL LOW (ref 79–97)
Monocytes Absolute: 0.6 10*3/uL (ref 0.1–0.9)
Monocytes: 11 %
Neutrophils Absolute: 3.2 10*3/uL (ref 1.4–7.0)
Neutrophils: 54 %
Platelets: 368 10*3/uL (ref 150–450)
RBC: 6.4 x10E6/uL — ABNORMAL HIGH (ref 4.14–5.80)
RDW: 18.8 % — ABNORMAL HIGH (ref 11.6–15.4)
WBC: 5.8 10*3/uL (ref 3.4–10.8)

## 2024-05-18 LAB — LIPASE: Lipase: 34 U/L (ref 13–78)

## 2024-05-20 ENCOUNTER — Ambulatory Visit: Payer: Self-pay | Admitting: Student

## 2024-05-30 ENCOUNTER — Encounter: Payer: Self-pay | Admitting: Specialist

## 2024-06-03 ENCOUNTER — Other Ambulatory Visit: Payer: Self-pay | Admitting: Specialist

## 2024-06-04 ENCOUNTER — Encounter: Payer: Self-pay | Admitting: *Deleted

## 2024-06-06 ENCOUNTER — Ambulatory Visit (INDEPENDENT_AMBULATORY_CARE_PROVIDER_SITE_OTHER): Admitting: Student

## 2024-06-06 ENCOUNTER — Encounter: Payer: Self-pay | Admitting: Student

## 2024-06-06 VITALS — BP 125/83 | HR 89 | Ht 67.0 in

## 2024-06-06 DIAGNOSIS — E1151 Type 2 diabetes mellitus with diabetic peripheral angiopathy without gangrene: Secondary | ICD-10-CM

## 2024-06-06 DIAGNOSIS — D509 Iron deficiency anemia, unspecified: Secondary | ICD-10-CM

## 2024-06-06 DIAGNOSIS — I70209 Unspecified atherosclerosis of native arteries of extremities, unspecified extremity: Secondary | ICD-10-CM

## 2024-06-06 DIAGNOSIS — R1013 Epigastric pain: Secondary | ICD-10-CM | POA: Diagnosis not present

## 2024-06-06 LAB — POCT GLYCOSYLATED HEMOGLOBIN (HGB A1C): HbA1c, POC (controlled diabetic range): 7.7 % — AB (ref 0.0–7.0)

## 2024-06-06 MED ORDER — EMPAGLIFLOZIN 10 MG PO TABS: 10.0000 mg | ORAL_TABLET | Freq: Every day | ORAL | 0 refills | Status: DC

## 2024-06-06 NOTE — Patient Instructions (Addendum)
 It was great to see you! Thank you for allowing me to participate in your care!  I recommend that you always bring your medications to each appointment as this makes it easy to ensure you are on the correct medications and helps us  not miss when refills are needed.  Our plans for today:  - call to schedule apt with GI doctor: [facility] [address] [place] [address], [place], [place] [address] Phone: [phone] - Continue your current medications  - Start Jardiance in addition to metformin  for better blood sugar control  - you will be contacted about the CT scan of your abdomen - you will be contacted to schedule apt with eye doctor  Take care and seek immediate care sooner if you develop any concerns.   Dr. Glenn Lange, DO [facility]

## 2024-06-06 NOTE — Patient Outreach (Signed)
 Aging Gracefully Program  OT Initial Visit  06/03/2024  [name] January 09, 1951 [id number]  Visit:  1- Initial Visit  Start Time:  1600 End Time:  1630 Total Minutes:  30  CCAP: Typical Daily Routine: Typical Daily Routine:: gets up cooks, spends time with wife and pet Spice What Types Of Care Problems Are You Having Throughout The Day?: entering and exiting bathroom and bedroom What Kind Of Help Do You Receive?: driving Do You Think You Need Other Types Of Help?: no What Do You Think Would Make Everyday Life Easier For You?: wider doors, ramp on back of house Do You Have Time For Yourself?: yes Patient Reported Equipment: Patient Reported Equipment Currently Used: Bedside Commode, Grab Bars, Reacher, Tub Bench, Wheelchair Functional Mobility-Walking Indoors/Getting Around the [employer]: Walking Indoors/Getting Around [employer]: Moderate Difficulty Do You:: Use A Device Importance Of Learning New Strategies:: Very Much Intervention: Yes Functional Mobility-Walk A Block: Walk A Block: Unable To Do Functional Mobility-Maintain Balance While Showering: Maintaining Balance While Showering: N/A Functional Mobility-Stooping, Crouching, Kneeling To Retreive Item: Stooping, Crouching, or Kneeling To Retrieve Item: Unable To Do Do You:: Use A Device Importance Of Learning New Strategies:: Not At All Functional Mobility-Bending From Standing Position To Pick Up Clothing Off The Floor: Bending Over From Standing Position To Pick Up Clothing Off The Floor: Unable To Do Do You:: Use A Device Importance Of Learning New Strategies:: Not At All Functional Mobility-Reaching For Items Above Shoulder Level: Reaching For Items Above Shoulder Level: No Difficulty Functional Mobility-Climb 1 Flight Of Stairs: Climb 1 Flight Of Stairs: Unable To Do Intervention: Yes Other Comments:: would love a ramp at back of house Functional Mobility-Move In And Out Of Chair: Move In and Out Of A Chair: No  Difficulty Functional Mobility-Move In And Out Of Bed: Move In and Out Of Bed: No Difficulty Functional Mobility-Move In And Out Of Bath/Shower: Move In And Out Of A Bath/Shower: A Little Difficulty Do You:: Use A Device Importance Of Learning New Strategies:: Not At All Functional Mobility-Get On And Off Toilet: Getting Up From The Floor: Unable To Do Functional Mobility-Into And Out Of Car, Not Including Driving: Into  And Out Of Car, Not Including Driving: No Difficulty Functional Mobility-Other Mobility Difficulty:      Activities of Daily Living-Bathing/Showering: ADL-Bathing/Showering: No Difficulty Activities of Daily Living-Personal Hygiene and Grooming: Personal Hygiene and Grooming: No Difficulty Activities of Daily Living-Toilet Hygiene: Toilet Hygiene: No Difficulty Activities of Daily Living-Put On And Take Off Undergarments (Incl. Fasteners): Put On And Take Off Undergarments (Incl. Fasteners): No Difficulty Activities of Daily Living-Put On And Take Off Shirt/Dress/Coat (Incl. Fasteners): Put On And Take Off Shirt/Dress/Coat (Incl. Fasteners): No Difficulty Activities of Daily Living-Put On And Take Off Socks And Shoes: Put On And Take Off Socks And  Shoes: N/A Activities of Daily Living-Feed Self: Feed Self: No Difficulty Activities of Daily Living-Rest And Sleep: Rest and Sleep: A Little Difficulty Other Comments:: due to stress of bathroom being redone due to sewer leak Activities of Daily Living-Sexual Activity: Sexual  Activity:  (did not ask) Activities of Daily Living-Other Activity Identified:    Instrumental Activities of Daily Living-Light Homemaking (Laundry, Straightening Up, Vacuuming):  Do Light Homemaking (Laundry, Straightening Up, Vacuuming): No Difficulty Instrumental Activities of Daily Living-Making A Bed: Making a Bed: No Difficulty Instrumental Activities of Daily Living-Washing Dishes By Hand While Standing At The Sink: Washing Dishes  By Hand While Standing At The Sink: No Difficulty Instrumental Activities of Daily Living-Grocery  Shopping: Do Grocery Shopping: A Little Difficulty Instrumental Activities of Daily Living-Use Telephone: Use Telephone: No Difficulty Instrumental Activities of Daily Living-Financial Management: Financial Management: No Difficulty Instrumental Activities of Daily Living-Medications: Take Medications: No Difficulty Instrumental Activities of Daily Living-Health Management And Maintenance: Health Management & Maintenance: No Difficulty Instrumental Activities of Daily Living-Meal Preparation and Clean-Up: Meal Preparation and Clean-Up: No Difficulty Instrumental Activities of Daily Living-Provide Care For Others/Pets: Care For Others/Pets: Moderate Difficulty Other Comments:: would love a ramp in back so that he can care and play with Spice Instrumental Activities of Daily Living-Take Part In Organized Social Activities: Take Part In Organized Social Activities: N/A Instrumental Activities of Daily Living-Leisure Participation: Leisure Participation: N/A Instrumental Activities of Daily Living-Employment/Volunteer Activities: Employment/Volunteer Activities: N/A Instrumental Activities of Daily Living-Other Identifies:    Readiness To Change Score:  Readiness to Change Score: 8  Home Environment Assessment: Outside Home Entry:: ramp in front.  side entry has steep steps and back entry to deck has steps - Entryway/Foyer:: n/a Dining Room:: wfl Living Room:: wfl Kitchen:: wfl Stairs:: unable to navigate stairs he is wc level Bathroom:: difficult to fit his electric wc through bathroom door Master Bedroom:: difficult to fit his electric wc through bedroom door Laundry:: wife completes Basement:: n/a Hallways:: wfl Smoke/CO2 Detector:: n/a Scientist, forensic:: n/a Other Home Environment Concerns:: has ramp at front entrance.  would like ramp at back of home so that he can get out of  house a second way in case of fire and to tend to yard and dog, Spice   Patient Education: Education Provided: Yes Education Details: check for safety brochure issued and reviewed Person(s) Educated: Patient, Spouse Comprehension: Verbalized Understanding, Returned Demonstration  Goals:  Goals Addressed             This Visit's Progress    Patient Stated       Patient will improve safety entering and exiting to his back yard.     Patient Stated       Patient will improve mobility in and out of bedroom and bathroom.        Post Clinical Reasoning: Clinician View Of Client Situation:: Mr. Flanigan is very independent in his home at wc level.  He does most of the cooking and some housekeeping and loves caring for their dog, Spice Client View Of His/Her Situation:: He is satisfied with his current level and is not super open to any modifications such as a drop arm commode or  other items.  He is receptive to a ramp and widening doorways. Next Visit Plan:: review how to get up from a fall and problem solve goal 1 Orren Blades, MHA, OT/L [phone]

## 2024-06-06 NOTE — Progress Notes (Signed)
    SUBJECTIVE:   CHIEF COMPLAINT / HPI:   [name] is a 73 year old male who presents with persistent abdominal pain and for diabetes management.   He experiences persistent abdominal pain for several weeks, initially on the left side and epigastric area, now in the right upper quadrant and epigastric area.  See note from 05/17/2024 for more details. The pain is still exacerbated by eating but has improved with Carafate  and he is no longer having to eat only soft foods and is eating regular meals.  He is unsure if he doubled his Protonix  after the last visit as instructed but states that he has been taking the Carafate  4 times daily.   Non bloody bowel movements occur every other day and are loose.  He denies any nausea or vomiting.    For diabetes, he is currently taking metformin  1000 mg twice daily   PERTINENT  PMH / PSH: Epigastric pain, IDA  OBJECTIVE:   BP 125/83   Pulse 89   Ht 5' 7 (1.702 m)   SpO2 97%   BMI 24.28 kg/m    General: NAD, pleasant, well-appearing Cardiac: RRR, no murmurs. Respiratory: CTAB, normal effort, No wheezes, rales or rhonchi Abdomen: Bowel sounds present, TTP of epigastric area with mild guarding, soft, nondistended Skin: warm and dry Neuro: alert, no obvious focal deficits Psych: Normal affect and mood  ASSESSMENT/PLAN:   Iron  deficiency anemia Patient is no longer taking an iron  supplement, he is unsure when he last took it.  Hemoglobin very stable at 13.8 last month.  Advise he no longer needs to take a supplement and it has been removed from his med list.  Epigastric pain Persistent abdominal pain, now in right upper quadrant and epigastric areas worsened by eating. Differential includes peptic ulcer disease, stomach or pancreatic malignancy, diverticular disease.  Previous labs nonconcerning last month including CBC w/ diff, CMP and lipase. CT scan necessary for further evaluation given age. Emphasized follow-up with GI for potential  EGD - Provided contact information for GI specialist to schedule appointment  - Order CT scan of abdomen and pelvis with contrast.  - Continue current medications.      Dr. Glenn Lange, DO [facility]

## 2024-06-07 ENCOUNTER — Encounter: Payer: Self-pay | Admitting: Gastroenterology

## 2024-06-07 NOTE — Assessment & Plan Note (Signed)
 Hemoglobin A1c increased from 7.1% to 7.7%. Discussed dietary changes versus medication. He prefers medication. Discussed starting Jardiance , including risks and benefits.  - Start Jardiance  at 10 mg daily - Continue metformin .  - Provide handout on diabetic diet.  - Plan to recheck A1c in three months.  - Referral placed for ophthalmology for eye exam

## 2024-06-07 NOTE — Assessment & Plan Note (Signed)
 Patient is no longer taking an iron  supplement, he is unsure when he last took it.  Hemoglobin very stable at 13.8 last month.  Advise he no longer needs to take a supplement and it has been removed from his med list.

## 2024-06-07 NOTE — Assessment & Plan Note (Addendum)
 Persistent abdominal pain, now in right upper quadrant and epigastric areas worsened by eating. Differential includes peptic ulcer disease, stomach or pancreatic malignancy, diverticular disease.  Previous labs nonconcerning last month including CBC w/ diff, CMP and lipase. CT scan necessary for further evaluation given age. Emphasized follow-up with GI for potential EGD - Provided contact information for GI specialist to schedule appointment  - Order CT scan of abdomen and pelvis with contrast.  - Continue current medications.

## 2024-06-12 ENCOUNTER — Other Ambulatory Visit: Payer: Self-pay | Admitting: *Deleted

## 2024-06-12 NOTE — Patient Outreach (Signed)
 Aging Gracefully Program  06/12/2024  [name] 31-Dec-1950 [id number]  Telephone call made to [name] to schedule Aging Gracefully home visit #1. Discussed Education officer, environmental message for [name] in attempt to schedule home visit as well. [name] agreeable to joint home visit scheduled for Monday, June 23 at 1 pm. Clinical research associate provided contact information.  Nolberto Batty, MSN, RN, BSN [place]  [facility] RN Case Manager for Aging Gracefully Direct Dial: [phone]

## 2024-06-17 ENCOUNTER — Encounter: Payer: Self-pay | Admitting: *Deleted

## 2024-06-17 ENCOUNTER — Other Ambulatory Visit: Payer: Self-pay | Admitting: *Deleted

## 2024-06-17 NOTE — Patient Instructions (Signed)
 Visit Information  Thank you for taking time to visit with me today. Please don't hesitate to contact me if I can be of assistance to you before our next scheduled home appointment.  Following are the goals we discussed today:   Goals Addressed               This Visit's Progress     AG RN (pt-stated)        06/17/24  Assessment: Mr. 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  Reports pain 5/6 out of 10. Takes medications as prescribed. Mr. Laker reports being independent. He also cooks for both he and his wife. Mr. Robideau has a sense of humor and likes to joke around with his wife. Denies any major concerns during visit.   Interventions: Provided chronic disease management booklet, paper calendar, and writer's contact information. Discussed pain management strategies.   Plan: Scheduled next home visit on July 29 at 11 am.   CLIENT/RN ACTION PLAN - PAIN  Registered Nurse:   Date:  Client Name: Client ID:    Target Area:  PAIN   Why Problem May Occur:    Target Goal:   STRATEGIES Coping Strategies Ideas:  Heat Reviewed 06/18/23 Use heating pad or warm towel on painful area no more than 20 minutes at a time. Don't sleep with a heating pad on - it could burn your skin   Ice Reviewed 06/18/23 Use ice pack or frozen bag of vegetables on painful area.   Leave cold pack on for less than 20 minutes. Ice can burn your skin.  Don't leave ice on longer than 20 minutes.   Activity and Exercise Reviewed 06/18/23 Joints get stiff when not in use Aging Gracefully Exercises Walking (inside or outside) [employer]:  cooking, cleaning, Building surveyor Massage  Listen to Music Reviewed 06/18/23 Listening to music can decrease pain. Turn off the TV and turn on the radio. Distract with play station game  Prayer/Meditation Reviewed 06/18/23 Prayer and meditation can decrease pain   Other   Acetaminophen /Tylenol  (same medication) Reviewed  06/18/23 DO NOT take more Acetaminophen  than below because it can be bad for your liver. 500 mg. tablets:  2 tablets every 8 hours, as needed for pain.  Do not take more than 6, (500 mg) tablets every day. 300 mg. tablets:  2 tablets every 6 hours, as needed for pain.  Do not take more than 8 (325 mg) tablets every day. Look for Acetaminophen /Tylenol  in other medicines you buy over the counter.   Still in Pain Reviewed 06/18/23 There ae a lot of different kinds of pain medicines:  creams, patches and supplements. Ask your Healthcare Provider about other pain medications.   Stop Smoking Reviewed 06/18/23 Smoking can make arthritis worse.   Stop Pain Reviewed 06/18/23 Before it gets bad. Once in pain It is harder to get rid of. Begin pain relief while you have mild pain.   Other   Other    ;  PRACTICE It is important to practice the strategies so we can determine if they will be effective in helping to reach the goal.    Follow these specific recommendations:        If strategy does not work the first time, try it again.  We may make some changes over the next few sessions.    We may make some changes over the next few sessions, based on how they work.    Klinton Candelas  Shona, MSN, RN, BSN [place]  [facility], Healthy Communities RN Case Manager for Aging Gracefully Direct Dial: [phone]                                        Our next appointment is on July 29 at 11 am  If you are experiencing a Mental Health or Behavioral Health Crisis or need someone to talk to, please call the Suicide and Crisis Lifeline: 988 call the [place]  National Suicide Prevention Lifeline: 847-678-2782 or TTY: (864)675-8572 TTY 604-536-5378) to talk to a trained counselor call 1-800-273-TALK (toll free, 24 hour hotline) go to [facility] [address] [place] [phone]) call 911   The patient verbalized  understanding of instructions, educational materials, and care plan provided today and agreed to receive a mailed copy of patient instructions, educational materials, and care plan.   Pablo Shona, MSN, RN, BSN [place]  [facility], Healthy Communities RN Case Manager for Aging Gracefully Direct Dial: [phone]

## 2024-06-17 NOTE — Patient Outreach (Signed)
 Aging Gracefully Program  RN Visit  06/17/2024  EULALIO REAMY 05/15/1951 [id number]  Visit:  RN Visit Number: 1- Initial Visit  RN TIME CALCULATION: Start TIme:  RN Start Time Calculation: 1300 End Time:  RN Stop Time Calculation: 1600 Total Minutes:  RN Time Calculation: 180  Readiness To Change Score:  Readiness to Change Score: 10  Universal RN Interventions: Calendar Distribution: Yes Exercise Review: No Medications: Yes Medication Changes: No Mood: Yes Pain: Yes PCP Advocacy/Support: No Fall Prevention: Yes Incontinence: Yes Clinician View Of Client Situation: Arrived for joint home visit for Mr. Dumont and spouse. Mr. Prude wheelchair bound. Independent in wheelchair mobility. Home neat wtihout clutter. Dog Spice behind doggie gate in kitchen. Client View Of His/Her Situation: [name] doing well overall Has runny nose from allergies he states. Reports pain 5-6 out of 10. Enjoys the company of his dog. Has playful banter with his wife.  Healthcare Provider Communication: Did Surveyor, mining With [employer] Provider?: No Healthcare Provider Response According to RN: N/A According to Client, Did PCP Report Communication With An Aging Gracefully RN?: No Healthcare Provider Response According To Client: N/A  Clinician View of Client Situation: Clinician View Of Client Situation: Arrived for joint home visit for Mr. Ribaudo and spouse. Mr. Oelkers wheelchair bound. Independent in wheelchair mobility. Home neat wtihout clutter. Dog Spice behind doggie gate in kitchen. Client's View of His/Her Situation: Client View Of His/Her Situation: Mr. Mysliwiec doing well overall Has runny nose from allergies he states. Reports pain 5-6 out of 10. Enjoys the company of his dog. Has playful banter with his wife.  Medication Assessment: Reviewed    OT Update: Pending [facility] contracts and assessments.   Session Summary: Mr. Cuadros finds a lot of joy in his dog Spice. He reports doing well  overall. He has a sense of humor and pleasant demeanor. It was a rewarding joint visit with both Mr. Byers and spouse.   Goals Addressed               This Visit's Progress     AG RN (pt-stated)        06/17/24  Assessment: Mr. 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  Reports pain 5/6 out of 10. Takes medications as prescribed. Mr. Craun reports being independent. He also cooks for both he and his wife. Mr. Cervone has a sense of humor and likes to joke around with his wife. Denies any major concerns during visit.   Interventions: Provided chronic disease management booklet, paper calendar, and writer's contact information. Discussed pain management strategies.   Plan: Scheduled next home visit on July 29 at 11 am.   CLIENT/RN ACTION PLAN - PAIN  Registered Nurse:   Date:  Client Name: Client ID:    Target Area:  PAIN   Why Problem May Occur:    Target Goal:   STRATEGIES Coping Strategies Ideas:  Heat Reviewed 06/18/23 Use heating pad or warm towel on painful area no more than 20 minutes at a time. Don't sleep with a heating pad on - it could burn your skin   Ice Reviewed 06/18/23 Use ice pack or frozen bag of vegetables on painful area.   Leave cold pack on for less than 20 minutes. Ice can burn your skin.  Don't leave ice on longer than 20 minutes.   Activity and Exercise Reviewed 06/18/23 Joints get stiff when not in use Aging Gracefully Exercises Walking (inside or outside) [employer]  Housework:  cooking, Education officer, environmental, Building surveyor Massage  Listen to Music Reviewed 06/18/23 Listening to music can decrease pain. Turn off the TV and turn on the radio. Distract with play station game  Prayer/Meditation Reviewed 06/18/23 Prayer and meditation can decrease pain   Other   Acetaminophen /Tylenol  (same medication) Reviewed 06/18/23 DO NOT take more Acetaminophen  than below because it can be bad for your liver. 500 mg. tablets:  2  tablets every 8 hours, as needed for pain.  Do not take more than 6, (500 mg) tablets every day. 300 mg. tablets:  2 tablets every 6 hours, as needed for pain.  Do not take more than 8 (325 mg) tablets every day. Look for Acetaminophen /Tylenol  in other medicines you buy over the counter.   Still in Pain Reviewed 06/18/23 There ae a lot of different kinds of pain medicines:  creams, patches and supplements. Ask your Healthcare Provider about other pain medications.   Stop Smoking Reviewed 06/18/23 Smoking can make arthritis worse.   Stop Pain Reviewed 06/18/23 Before it gets bad. Once in pain It is harder to get rid of. Begin pain relief while you have mild pain.   Other   Other    ;  PRACTICE It is important to practice the strategies so we can determine if they will be effective in helping to reach the goal.    Follow these specific recommendations:        If strategy does not work the first time, try it again.  We may make some changes over the next few sessions.    We may make some changes over the next few sessions, based on how they work.    [name], MSN, RN, BSN [place]  [facility], Healthy Communities RN Case Manager for Aging Gracefully Direct Dial: [phone]

## 2024-06-21 ENCOUNTER — Ambulatory Visit (HOSPITAL_COMMUNITY)

## 2024-06-21 NOTE — Addendum Note (Signed)
 Addended by: JOSHUA LAURAINE POUR on: 06/21/2024 08:16 AM   Modules accepted: Orders

## 2024-06-26 ENCOUNTER — Ambulatory Visit: Admitting: Gastroenterology

## 2024-06-26 NOTE — Progress Notes (Deleted)
 SABRA

## 2024-07-01 ENCOUNTER — Encounter: Payer: Self-pay | Admitting: Specialist

## 2024-07-08 ENCOUNTER — Telehealth: Payer: Self-pay

## 2024-07-08 NOTE — Telephone Encounter (Signed)
 Patient calls nurse line regarding continued abdominal pain.   Reports that pain is localized primarily to the RUQ. Patient was seen for abdominal pain on 6/12, however, feels that pain has worsened over the last week.   He denies fever, chills, nausea, vomiting or diarrhea. No blood in stool.   He reports decreased appetite as he feels that pain is worse after he eats. He also reports inability to take medications due to abdominal pain after taking them.   Advised patient that if abdominal pain has worsened, we would want him to come in for re evaluation. Offered same day visit for today. He declined and states that he does not have transportation.   Patient has CT scan pending for 7/17.  Patient is asking for any advice regarding abdominal pain and inability to take medications due to this pain. I advised patient that this is difficult to assess over the phone and in person eval is recommended, however, I will forward to PCP.   ED precautions discussed.   Chiquita JAYSON English, RN

## 2024-07-11 ENCOUNTER — Ambulatory Visit (HOSPITAL_COMMUNITY)
Admission: RE | Admit: 2024-07-11 | Discharge: 2024-07-11 | Disposition: A | Discharge status: Home / Self Care | Source: Ambulatory Visit | Attending: Family Medicine | Admitting: Family Medicine

## 2024-07-11 DIAGNOSIS — R1013 Epigastric pain: Secondary | ICD-10-CM | POA: Diagnosis not present

## 2024-07-11 DIAGNOSIS — N2889 Other specified disorders of kidney and ureter: Secondary | ICD-10-CM | POA: Diagnosis not present

## 2024-07-11 DIAGNOSIS — E1151 Type 2 diabetes mellitus with diabetic peripheral angiopathy without gangrene: Secondary | ICD-10-CM | POA: Diagnosis not present

## 2024-07-11 DIAGNOSIS — I70209 Unspecified atherosclerosis of native arteries of extremities, unspecified extremity: Secondary | ICD-10-CM | POA: Diagnosis not present

## 2024-07-11 DIAGNOSIS — N281 Cyst of kidney, acquired: Secondary | ICD-10-CM | POA: Diagnosis not present

## 2024-07-11 DIAGNOSIS — R109 Unspecified abdominal pain: Secondary | ICD-10-CM | POA: Diagnosis not present

## 2024-07-11 LAB — POCT I-STAT CREATININE: Creatinine, Ser: 0.8 mg/dL (ref 0.61–1.24)

## 2024-07-11 MED ORDER — IOHEXOL 300 MG/ML  SOLN
100.0000 mL | Freq: Once | INTRAMUSCULAR | Status: AC | PRN
  Administered 2024-07-11: 100 mL via INTRAVENOUS

## 2024-07-12 NOTE — Telephone Encounter (Signed)
 Agree with in person evaluation

## 2024-07-12 NOTE — Telephone Encounter (Signed)
 Patient returns call to nurse line.   Reports that he had CT scan yesterday and he has not heard anything regarding these results.   Advised that results are still pending radiologist review. Advised that we would contact him once we have results.   Pt scheduled for follow up visit next Wednesday with Dr. Sowell. (This is the soonest that patient can schedule due to transportation.)  Chiquita JAYSON English, RN

## 2024-07-15 ENCOUNTER — Ambulatory Visit: Payer: Self-pay | Admitting: Family Medicine

## 2024-07-15 DIAGNOSIS — K861 Other chronic pancreatitis: Secondary | ICD-10-CM

## 2024-07-15 NOTE — Telephone Encounter (Signed)
 Called with results. This shows mild pancreatitis. Discussed with patient. RUQ ultrasound ordered. Red team please help to schedule this. Dr. Jennelle- at your visit please discuss pain control and consider referral to GI.   Suzann Daring, MD  Family Medicine Teaching Service

## 2024-07-15 NOTE — Telephone Encounter (Signed)
 Patient already has an upcoming appointment with Dr.Sowell on 07/17/24. Cassell Mary CMA

## 2024-07-17 ENCOUNTER — Ambulatory Visit: Admitting: Student

## 2024-07-17 NOTE — Patient Instructions (Incomplete)
 It was great to see you! Thank you for allowing me to participate in your care!  I recommend that you always bring your medications to each appointment as this makes it easy to ensure we are on the correct medications and helps us  not miss when refills are needed.  Our plans for today:  - Mild Pancreatitis  Follow up with GI   [facility] [address] [place] [address], [place], [place] [address] Phone: [phone] -   We are checking some labs today, I will call you if they are abnormal will send you a MyChart message or a letter if they are normal.  If you do not hear about your labs in the next 2 weeks please let us  know.***  Take care and seek immediate care sooner if you develop any concerns.   Dr. Penne Rhein, MD [facility]

## 2024-07-17 NOTE — Progress Notes (Deleted)
  SUBJECTIVE:   CHIEF COMPLAINT / HPI:   F/u Epigastric Pain  -Pt seen 06/06/24 for epigastric pain not better w/ carfate > CT Abd Pelv showed mild pancreatitis -Pt to f/u w/ GI  PERTINENT  PMH / PSH: ***  OBJECTIVE:  There were no vitals taken for this visit. ***  ASSESSMENT/PLAN:   Assessment & Plan  No follow-ups on file. Penne Rhein, MD 07/17/2024, 8:07 AM PGY-***, [facility] Family Medicine {    This will disappear when note is signed, click to select method of visit    :1}

## 2024-07-18 ENCOUNTER — Other Ambulatory Visit: Payer: Self-pay

## 2024-07-18 DIAGNOSIS — E7800 Pure hypercholesterolemia, unspecified: Secondary | ICD-10-CM

## 2024-07-19 MED ORDER — ATORVASTATIN CALCIUM 40 MG PO TABS: 40.0000 mg | ORAL_TABLET | Freq: Every day | ORAL | 1 refills | Status: DC

## 2024-07-22 ENCOUNTER — Other Ambulatory Visit: Payer: Self-pay | Admitting: *Deleted

## 2024-07-22 NOTE — Patient Outreach (Signed)
 Aging Gracefully Program  07/22/2024  AVANEESH PEPITONE 01-28-1951 [id number]   Telephone call made to [name] (wife) to confirm tomorrow's joint home visit for Mr. Lyerly and wife. [name] (wife) reports their air conditioner just went out. States tomorrow is not a good day for home visit. Discussed writer will contact her next week to see when it will be a good time to reschedule joint home visit for Mr. Eskelson and wife.   [name], MSN, RN, BSN [place]  [facility], Healthy Communities RN Case Manager for Aging Gracefully Direct Dial: [phone]

## 2024-07-23 ENCOUNTER — Encounter: Payer: Self-pay | Admitting: *Deleted

## 2024-07-30 ENCOUNTER — Ambulatory Visit
Admission: RE | Admit: 2024-07-30 | Discharge: 2024-07-30 | Disposition: A | Discharge status: Home / Self Care | Source: Ambulatory Visit | Attending: Family Medicine | Admitting: Family Medicine

## 2024-07-30 ENCOUNTER — Other Ambulatory Visit: Payer: Self-pay | Admitting: *Deleted

## 2024-07-30 DIAGNOSIS — N281 Cyst of kidney, acquired: Secondary | ICD-10-CM | POA: Diagnosis not present

## 2024-07-30 DIAGNOSIS — K861 Other chronic pancreatitis: Secondary | ICD-10-CM

## 2024-07-30 NOTE — Patient Outreach (Signed)
 Aging Gracefully Program  07/30/2024  [name] 26-Oct-1951 [id number]    Unsuccessful call attempt to reach [name] (wife) to schedule Aging Gracefully RN joint home visit with [name] and [name]. However, no answer. HIPAA compliant voicemail message left to request return call.   [name], MSN, RN, BSN [place]  [facility], Healthy Communities RN Case Manager for Aging Gracefully Direct Dial: [phone]

## 2024-08-03 ENCOUNTER — Other Ambulatory Visit: Payer: Self-pay | Admitting: Podiatry

## 2024-08-06 ENCOUNTER — Other Ambulatory Visit: Payer: Self-pay | Admitting: *Deleted

## 2024-08-06 DIAGNOSIS — I70209 Unspecified atherosclerosis of native arteries of extremities, unspecified extremity: Secondary | ICD-10-CM

## 2024-08-06 DIAGNOSIS — F32A Depression, unspecified: Secondary | ICD-10-CM

## 2024-08-06 DIAGNOSIS — E1151 Type 2 diabetes mellitus with diabetic peripheral angiopathy without gangrene: Secondary | ICD-10-CM

## 2024-08-06 DIAGNOSIS — G546 Phantom limb syndrome with pain: Secondary | ICD-10-CM

## 2024-08-07 ENCOUNTER — Ambulatory Visit: Payer: Self-pay | Admitting: Family Medicine

## 2024-08-09 MED ORDER — DULOXETINE HCL 60 MG PO CPEP: 60.0000 mg | ORAL_CAPSULE | Freq: Every day | ORAL | 3 refills | Status: DC

## 2024-08-09 MED ORDER — EMPAGLIFLOZIN 10 MG PO TABS: 10.0000 mg | ORAL_TABLET | Freq: Every day | ORAL | 0 refills | Status: DC

## 2024-08-16 ENCOUNTER — Other Ambulatory Visit: Payer: Self-pay | Admitting: *Deleted

## 2024-08-16 NOTE — Patient Outreach (Signed)
 Telephone call made to [name] to schedule joint AG RN home visit. However, no answer. Voicemail box. Unable to leave voicemail message.   Telephone call made to Mr. Courtwright (spouse) to schedule joint home visit.  Mr. Blanford agreeable to joint visit for Mr. Quest and [name] on 08/21/24 at 3pm.  [name], MSN, RN, BSN [place]  [facility], Healthy Communities RN Post- Acute Care Manager Direct Dial: [phone]

## 2024-08-19 NOTE — Progress Notes (Unsigned)
 Chief Complaint:Epigastric pain  Primary GI Doctor: (previously Dr. Aneita)  HPI:  Patient is a  73  year old male patient with past medical history of primary hypertension, diabetes type 2,HLD, PAD, neuropathy, depression, who was referred to me by Jerrie Gathers, DO on 05/17/24 for a evaluation of Epigastric pain  .    Interval History  Patient admits/denies GERD Patient taking pantoprazole  40 mg twice daily Patient taking Carafate  Patient admits/denies dysphagia Patient admits/denies nausea, vomiting, or weight loss  Patient admits/denies altered bowel habits Patient admits/denies abdominal pain Patient admits/denies rectal bleeding   Denies/Admits alcohol Denies/Admits smoking Denies/Admits NSAID use. Denies/Admits they are on blood thinners.  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 Unice neurosurgery   Chronic lower back pain    Claudication of both lower extremities (HCC) 07/15/2015   Clotting disorder (HCC)    COPD (chronic obstructive pulmonary disease) (HCC)    Critical lower limb ischemia (HCC) 12/19/2019   Diabetes mellitus    takes Metformin  daily   GERD (gastroesophageal reflux disease)    takes Protonix  daily   Glaucoma    History of blood transfusion    related to low HgB ((09/10/2015   Hyperlipidemia    takes Vytorin  daily   Hypertension    takes Benazepril  and Bystolic  daily   PAD (peripheral artery disease) (HCC)    Pneumonia    Radiculopathy, cervical region 02/11/2017   Shortness of breath dyspnea     with exertion   Tobacco user    Toe fracture, right 05/09/2011   Wears glasses     Past Surgical History:  Procedure Laterality Date   ABDOMINAL AORTOGRAM W/LOWER EXTREMITY N/A 12/11/2017   Procedure: ABDOMINAL AORTOGRAM W/LOWER EXTREMITY;  Surgeon: Eliza Lonni RAMAN, MD;  Location: [facility] INVASIVE CV LAB;  Service: Cardiovascular;  Laterality: N/A;   ABDOMINAL AORTOGRAM W/LOWER EXTREMITY Bilateral 04/22/2020   Procedure: ABDOMINAL AORTOGRAM W/LOWER EXTREMITY;  Surgeon: Gretta Lonni PARAS, MD;  Location: [facility] INVASIVE CV LAB;  Service: Cardiovascular;  Laterality: Bilateral;   ABDOMINAL AORTOGRAM W/LOWER EXTREMITY N/A 07/27/2023   Procedure: ABDOMINAL AORTOGRAM W/LOWER EXTREMITY;  Surgeon: Gretta Lonni PARAS, MD;  Location: [facility] INVASIVE CV LAB;  Service: Cardiovascular;  Laterality: N/A;   ABDOMINAL AORTOGRAM W/LOWER EXTREMITY Right 09/25/2023   Procedure: ABDOMINAL AORTOGRAM W/LOWER EXTREMITY;  Surgeon: Sheree Penne Lonni, MD;  Location: [facility] INVASIVE CV LAB;  Service: Cardiovascular;  Laterality: Right;   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   AMPUTATION Left 02/19/2021   Procedure: LEFT ABOVE KNEE AMPUTATION;  Surgeon: Magda Debby SAILOR, MD;  Location: [facility] OR;  Service: Vascular;  Laterality: Left;   AMPUTATION Right 09/27/2023   Procedure: RIGHT AMPUTATION ABOVE KNEE;  Surgeon: Gretta Lonni PARAS, MD;  Location: [facility] OR;  Service: Vascular;  Laterality: Right;   ANTERIOR CERVICAL DECOMP/DISCECTOMY FUSION  03/08/12   C6-7   ANTERIOR CERVICAL DECOMP/DISCECTOMY FUSION  03/08/2012   Procedure: ANTERIOR CERVICAL DECOMPRESSION/DISCECTOMY FUSION 1 LEVEL/HARDWARE REMOVAL;  Surgeon: Fairy Unice, MD;  Location: [facility]  NEURO ORS;  Service: Neurosurgery;  Laterality: N/A;  revison of C5-7 anterior cervical decompression with fusion with Cervical Five-Thoracic One anterior cervical decompression with fusion with interbody prothesis plating and bonegraft   AORTIC ARCH ANGIOGRAPHY N/A 09/25/2023    Procedure: AORTIC ARCH ANGIOGRAPHY;  Surgeon: Sheree Penne Bruckner, MD;  Location: [facility] INVASIVE CV LAB;  Service: Cardiovascular;  Laterality: N/A;   BACK SURGERY  1996   BIOPSY  12/05/2020   Procedure: BIOPSY;  Surgeon: Aneita Gwendlyn DASEN, MD;  Location: [facility] ENDOSCOPY;  Service: Endoscopy;;   BYPASS GRAFT FEMORAL-PERONEAL Left 11/11/2016   Procedure: REDO LEFT FEMORAL-PERONEAL BYPASS WITH PROPATEN X 80CM GRAFT;  Surgeon: Bruckner GORMAN Blade, MD;  Location: [facility] OR;  Service: Vascular;  Laterality: Left;   BYPASS GRAFT FEMORAL-PERONEAL Right 08/09/2023   Procedure: RIGHT  FEMORAL  ARTERY TO PERONEAL ARTERY BYPASS GRAFT USING GREATER SAPHENOUS VEIN;  Surgeon: Gretta Bruckner PARAS, MD;  Location: [facility] OR;  Service: Vascular;  Laterality: Right;   COLONOSCOPY W/ BIOPSIES AND POLYPECTOMY  08/17/2012   f/u 5 years, 4 polyps, no high grade dysplasia or malignancy, tubular adenoma, hyperplastic polyops   COLONOSCOPY WITH PROPOFOL  N/A 12/05/2020   Procedure: COLONOSCOPY WITH PROPOFOL ;  Surgeon: Aneita Gwendlyn DASEN, MD;  Location: [facility] ENDOSCOPY;  Service: Endoscopy;  Laterality: N/A;   EMBOLECTOMY Left 12/19/2019   Procedure: LEFT FEMERAL- PERONEAL THROMBECTOMY;  Surgeon: Gretta Bruckner PARAS, MD;  Location: [facility] OR;  Service: Vascular;  Laterality: Left;   ENDARTERECTOMY FEMORAL Right 04/24/2020   Procedure: RIGHT FEMORAL ENDARTERECTOMY;  Surgeon: Gretta Bruckner PARAS, MD;  Location: [facility] OR;  Service: Vascular;  Laterality: Right;   ENTEROSCOPY N/A 12/11/2015   Procedure: ENTEROSCOPY;  Surgeon: Belvie Just, MD;  Location: [facility] ENDOSCOPY;  Service: Endoscopy;  Laterality: N/A;   ENTEROSCOPY N/A 03/29/2018   Procedure: ENTEROSCOPY;  Surgeon: Just Belvie, MD;  Location: [facility] ENDOSCOPY;  Service: Endoscopy;  Laterality: N/A;   ESOPHAGOGASTRODUODENOSCOPY  08/17/2012   normal esophagus and GEJ, diffuse gastritis with erythema- no malignancy, reactive gastropathy  with focal intestinal metaplasia    ESOPHAGOGASTRODUODENOSCOPY (EGD) WITH PROPOFOL  N/A 12/05/2020   Procedure: ESOPHAGOGASTRODUODENOSCOPY (EGD) WITH PROPOFOL ;  Surgeon: Aneita Gwendlyn DASEN, MD;  Location: [facility] ENDOSCOPY;  Service: Endoscopy;  Laterality: N/A;   FEMORAL-POPLITEAL BYPASS GRAFT Left 01/06/2016   Procedure: Left  COMMON FEMORAL-BELOW KNEE POPLITEAL ARTERY Bypass using non-reversed translocated saphenous vein graft from left leg;  Surgeon: Lynwood JONETTA Collum, MD;  Location: [facility] OR;  Service: Vascular;  Laterality: Left;   GIVENS CAPSULE STUDY N/A 11/24/2015   Procedure: GIVENS CAPSULE STUDY;  Surgeon: Renaye Sous, MD;  Location: [facility] ENDOSCOPY;  Service: Endoscopy;  Laterality: N/A;   HOT HEMOSTASIS N/A 03/29/2018   Procedure: HOT HEMOSTASIS (ARGON PLASMA COAGULATION/BICAP);  Surgeon: Just Belvie, MD;  Location: [facility] ENDOSCOPY;  Service: Endoscopy;  Laterality: N/A;   INGUINAL HERNIA REPAIR  1990's   right   INTRAOPERATIVE ARTERIOGRAM Left 01/06/2016   Procedure: INTRA OPERATIVE ARTERIOGRAM LEFT LOWER LEG;  Surgeon: Lynwood JONETTA Collum, MD;  Location: [facility] OR;  Service: Vascular;  Laterality: Left;   INTRAOPERATIVE ARTERIOGRAM Left 11/11/2016   Procedure: INTRA OPERATIVE ARTERIOGRAM LEFT LOWER EXTRIMITY;  Surgeon: Bruckner GORMAN Blade, MD;  Location: [facility] OR;  Service: Vascular;  Laterality: Left;   IR ANGIOGRAM FOLLOW UP STUDY  12/11/2017   IR GENERIC HISTORICAL  10/24/2016   IR ANGIOGRAM FOLLOW UP STUDY   LOWER EXTREMITY ANGIOGRAM Bilateral 07/30/2015   Procedure: Lower Extremity Angiogram;  Surgeon: Redell LITTIE Door, MD;  Location: [facility]  INVASIVE CV LAB;  Service: Cardiovascular;  Laterality: Bilateral;   LUMBAR DISC SURGERY  1996   lower   PATCH ANGIOPLASTY Left 12/19/2019   Procedure: LEFT FEMORAL -PERONEAL PATCH ANGIOPLASTY WITH XENOSURE BIOLOGIC PATCH  ;  Surgeon: Gretta Lonni PARAS, MD;  Location: [facility] OR;  Service: Vascular;  Laterality: Left;   PATCH ANGIOPLASTY Right 04/24/2020   Procedure: Patch Angioplasty of Right Femoral Artery  using Long Xenosure Biologic Patch 1cm x 14 cm;  Surgeon: Gretta Lonni PARAS, MD;  Location: [facility] OR;  Service: Vascular;  Laterality: Right;   PERIPHERAL VASCULAR CATHETERIZATION N/A 07/30/2015   Procedure: Abdominal Aortogram;  Surgeon: Redell LITTIE Door, MD;  Location: [facility] INVASIVE CV LAB;  Service: Cardiovascular;  Laterality: N/A;   PERIPHERAL VASCULAR CATHETERIZATION N/A 10/24/2016   Procedure: Abdominal Aortogram;  Surgeon: Lonni GORMAN Blade, MD;  Location: [facility] INVASIVE CV LAB;  Service: Cardiovascular;  Laterality: N/A;   PERIPHERAL VASCULAR CATHETERIZATION N/A 10/24/2016   Procedure: Lower Extremity Angiography;  Surgeon: Lonni GORMAN Blade, MD;  Location: [facility] INVASIVE CV LAB;  Service: Cardiovascular;  Laterality: N/A;   PERIPHERAL VASCULAR INTERVENTION Right 12/11/2017   Procedure: PERIPHERAL VASCULAR INTERVENTION;  Surgeon: Blade Lonni GORMAN, MD;  Location: [facility] INVASIVE CV LAB;  Service: Cardiovascular;  Laterality: Right;   PERIPHERAL VASCULAR INTERVENTION Right 07/27/2023   Procedure: PERIPHERAL VASCULAR INTERVENTION;  Surgeon: Gretta Lonni PARAS, MD;  Location: [facility] INVASIVE CV LAB;  Service: Cardiovascular;  Laterality: Right;  [place] Ill[place]   UPPER EXTREMITY ANGIOGRAPHY  09/25/2023   Procedure: Upper Extremity Angiography;  Surgeon: Sheree Penne Lonni, MD;  Location: [facility] INVASIVE CV LAB;  Service: Cardiovascular;;   VASCULAR SURGERY  ~ 2007   Stent SFA    VEIN HARVEST Left 01/06/2016   Procedure: LEFT GREATER SAPPHENOUS VEIN HARVEST;  Surgeon: Lynwood JONETTA Collum, MD;  Location: [facility] OR;  Service: Vascular;  Laterality: Left;   VEIN HARVEST Right 08/09/2023   Procedure: RIGHT GREATER SAPHENOUS VEIN HARVEST;  Surgeon: Gretta Lonni PARAS, MD;  Location: [facility] OR;  Service: Vascular;  Laterality: Right;    Current Outpatient Medications  Medication Sig Dispense Refill   acetaminophen  (TYLENOL ) 325 MG tablet Take 1-2 tablets (325-650 mg total) by mouth every 4 (four) hours as needed for  mild pain (or temp >/= 101 F).     albuterol  (VENTOLIN  HFA) 108 (90 Base) MCG/ACT inhaler TAKE 2 PUFFS BY MOUTH EVERY 6 HOURS AS NEEDED FOR WHEEZE OR SHORTNESS OF BREATH 8.5 each 1   aspirin  EC 81 MG tablet Take 1 tablet (81 mg total) by mouth daily. Swallow whole. 90 tablet 3   atorvastatin  (LIPITOR) 40 MG tablet Take 1 tablet (40 mg total) by mouth daily. 90 tablet 1   benazepril  (LOTENSIN ) 5 MG tablet TAKE 1 TABLET BY MOUTH EVERY DAY 90 tablet 3   DULoxetine  (CYMBALTA ) 60 MG capsule Take 1 capsule (60 mg total) by mouth daily. 90 capsule 3   empagliflozin  (JARDIANCE ) 10 MG TABS tablet Take 1 tablet (10 mg total) by mouth daily. 90 tablet 0   ezetimibe  (ZETIA ) 10 MG tablet TAKE 1 TABLET BY MOUTH EVERY DAY 90 tablet 3   feeding supplement (ENSURE ENLIVE / ENSURE PLUS) LIQD Take 237 mLs by mouth 2 (two) times daily between meals.     gabapentin  (NEURONTIN ) 300 MG capsule Take 2 capsules (600 mg total) by mouth 3 (three) times daily. 180 capsule 1   melatonin 3 MG TABS tablet Take 2 tablets (6 mg total) by mouth at bedtime. 60  tablet 0   metFORMIN  (GLUCOPHAGE ) 1000 MG tablet Take 1 tablet (1,000 mg total) by mouth 2 (two) times daily with a meal. 60 tablet 0   nicotine  (NICODERM CQ  - DOSED IN MG/24 HR) 7 mg/24hr patch Place 1 patch (7 mg total) onto the skin daily. (Patient not taking: Reported on 06/17/2024)     pantoprazole  (PROTONIX ) 40 MG tablet Take 2 tablets (80 mg total) by mouth daily. 180 tablet 0   polyethylene glycol (MIRALAX  / GLYCOLAX ) 17 g packet Take 17 g by mouth daily as needed. 100 each 1   sucralfate  (CARAFATE ) 1 g tablet Take 1 tablet (1 g total) by mouth 4 (four) times daily -  with meals and at bedtime. 120 tablet 1   No current facility-administered medications for this visit.    Allergies as of 08/20/2024 - Review Complete 06/17/2024  Allergen Reaction Noted   Glipizide  Other (See Comments) 10/31/2012    Family History  Problem Relation Age of Onset   Cancer Mother         colon Cancer   Hyperlipidemia Mother    Diabetes Mother    Heart disease Father    Hypertension Father    Cancer Sister        Uterine   Diabetes Sister    Diabetes Brother     Review of Systems:    Constitutional: No weight loss, fever, chills, weakness or fatigue HEENT: Eyes: No change in vision               Ears, Nose, Throat:  No change in hearing or congestion Skin: No rash or itching Cardiovascular: No chest pain, chest pressure or palpitations   Respiratory: No SOB or cough Gastrointestinal: See HPI and otherwise negative Genitourinary: No dysuria or change in urinary frequency Neurological: No headache, dizziness or syncope Musculoskeletal: No new muscle or joint pain Hematologic: No bleeding or bruising Psychiatric: No history of depression or anxiety    Physical Exam:  Vital signs: There were no vitals taken for this visit.  Constitutional:   Pleasant *** male/male appears to be in NAD, Well developed, Well nourished, alert and cooperative Eyes:   PEERL, EOMI. No icterus. Conjunctiva pink. Neck:  Supple Throat: Oral cavity and pharynx without inflammation, swelling or lesion.  Respiratory: Respirations even and unlabored. Lungs clear to auscultation bilaterally.   No wheezes, crackles, or rhonchi.  Cardiovascular: Normal S1, S2. Regular rate and rhythm. No peripheral edema, cyanosis or pallor.  Gastrointestinal:  Soft, nondistended, nontender. No rebound or guarding. Normal bowel sounds. No appreciable masses or hepatomegaly. Rectal:  Not performed.  Anoscopy: Msk:  Symmetrical without gross deformities. Without edema, no deformity or joint abnormality.  Neurologic:  Alert and  oriented x4;  grossly normal neurologically.  Skin:   Dry and intact without significant lesions or rashes.  RELEVANT LABS AND IMAGING: CBC    Latest Ref Rng & Units 05/17/2024   11:18 AM 10/04/2023    3:54 AM 09/30/2023    3:51 AM  CBC  WBC 3.4 - 10.8 x10E3/uL 5.8  10.0  9.4    Hemoglobin 13.0 - 17.7 g/dL 86.1  9.1  7.6   Hematocrit 37.5 - 51.0 % 47.1  31.3  26.5   Platelets 150 - 450 x10E3/uL 368  508  336      CMP     Latest Ref Rng & Units 07/11/2024   11:24 AM 05/17/2024   11:18 AM 09/29/2023    4:33 AM  CMP  Glucose  70 - 99 mg/dL  883  854   BUN 8 - 27 mg/dL  9  13   Creatinine 9.38 - 1.24 mg/dL 9.19  9.16  9.34   Sodium 134 - 144 mmol/L  139  134   Potassium 3.5 - 5.2 mmol/L  4.3  3.9   Chloride 96 - 106 mmol/L  98  96   CO2 20 - 29 mmol/L  20  29   Calcium  8.6 - 10.2 mg/dL  9.6  8.9   Total Protein 6.0 - 8.5 g/dL  7.4    Total Bilirubin 0.0 - 1.2 mg/dL  0.2    Alkaline Phos 44 - 121 IU/L  115    AST 0 - 40 IU/L  13    ALT 0 - 44 IU/L  10    08/2022 echo-Left ventricular ejection fraction, by estimation, is 55%   GI procedures 12/05/2020 EGD - Normal esophagus. - Focal area of mildly nodular mucosa with focal pale mucosa in the greater curvature.Biopsied. - A tattoo was seen in the duodenum. The tattoo site appeared normal. 12/05/2020 colonoscopy - Internal hemorrhoids and hypertrophied anal papillae. - The examination was otherwise normal on direct and retroflexion views.- No specimens collected. 4//2019 small bowel endoscopy with Dr. Rollin - Normal esophagus. - Normal stomach. - A tattoo was seen in the duodenum. The tattoo site appeared normal. - A few non- bleeding angiodysplastic lesions in the jejunum. Treated with a monopolar probe. - No specimens collected.  Imaging: 07/30/2024 ultrasound abdomen complete IMPRESSION: Diffuse increased echogenicity of the hepatic parenchyma is a nonspecific indicator of hepatocellular dysfunction, most commonly steatosis. 07/11/2024 CT abdomen pelvis with and without contrast IMPRESSION: 1. Mild fat stranding about the pancreatic head and uncinate, suggesting mild pancreatitis. No pancreatic ductal dilatation or surrounding inflammatory changes. 2. Coronary artery disease. 3. Aortic valve  calcifications. Correlate for echocardiographic evidence of aortic valve dysfunction. 4. Severe aortic atherosclerosis. Aortic Atherosclerosis (ICD10-I70.0).   Assessment: 1. ***  Plan: 1. ***   Thank you for the courtesy of this consult. Please call me with any questions or concerns.   Leemon Ayala, FNP-C [facility] 08/19/2024, 4:21 PM  Cc: Bhagat, Virali, DO

## 2024-08-20 ENCOUNTER — Encounter: Payer: Self-pay | Admitting: *Deleted

## 2024-08-20 ENCOUNTER — Ambulatory Visit: Admitting: Gastroenterology

## 2024-08-21 ENCOUNTER — Other Ambulatory Visit: Payer: Self-pay | Admitting: *Deleted

## 2024-08-21 ENCOUNTER — Encounter: Payer: Self-pay | Admitting: *Deleted

## 2024-08-21 NOTE — Patient Instructions (Signed)
 Visit Information  Thank you for taking time to visit with me today. Please don't hesitate to contact me if I can be of assistance to you before our next scheduled home appointment.  Following are the goals we discussed today:   Goals Addressed               This Visit's Progress     AG RN (pt-stated)        06/17/24  Assessment: Mr. 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  Reports pain 5/6 out of 10. Takes medications as prescribed. Mr. Vonderhaar reports being independent. He also cooks for both he and his wife. Mr. Tout has a sense of humor and likes to joke around with his wife. Denies any major concerns during visit.   Interventions: Provided chronic disease management booklet, paper calendar, and writer's contact information. Discussed pain management strategies.   Plan: Scheduled next home visit on July 29 at 11 am.   CLIENT/RN ACTION PLAN - PAIN  Registered Nurse:  [name] Date: 06/17/24  Client Name: Alexa Nicholaus Client ID:    Target Area:  PAIN   Why Problem May Occur: chronic pain    Target Goal: decrease pain level to 5 or below out of 10 over the next 160 days.   STRATEGIES Coping Strategies Ideas:  Heat Reviewed 06/17/24 Reviewed 08/21/24 Use heating pad or warm towel on painful area no more than 20 minutes at a time. Don't sleep with a heating pad on - it could burn your skin   Ice Reviewed 06/17/24 Reviewed 08/21/24 Use ice pack or frozen bag of vegetables on painful area.   Leave cold pack on for less than 20 minutes. Ice can burn your skin.  Don't leave ice on longer than 20 minutes.   Activity and Exercise Reviewed 06/17/24 Reviewed upper body AG home exercises 08/21/24 Joints get stiff when not in use Aging Gracefully Exercises Walking (inside or outside) [employer]:  cooking, cleaning, Building surveyor Massage  Listen to Music Reviewed 06/17/24 Reviewed 08/21/24 Listening to music can decrease  pain. Turn off the TV and turn on the radio. Distract with play station game  Prayer/Meditation Reviewed 06/17/24 Reviewed 08/21/24 Prayer and meditation can decrease pain   Other   Acetaminophen /Tylenol  (same medication) Reviewed 06/17/24 Reviewed 08/21/24 DO NOT take more Acetaminophen  than below because it can be bad for your liver. 500 mg. tablets:  2 tablets every 8 hours, as needed for pain.  Do not take more than 6, (500 mg) tablets every day. 300 mg. tablets:  2 tablets every 6 hours, as needed for pain.  Do not take more than 8 (325 mg) tablets every day. Look for Acetaminophen /Tylenol  in other medicines you buy over the counter.   Still in Pain Reviewed 06/17/24 Reviewed 08/21/24 There ae a lot of different kinds of pain medicines:  creams, patches and supplements. Ask your Healthcare Provider about other pain medications.   Stop Smoking Reviewed 06/17/24 Smoking can make arthritis worse.   Stop Pain Reviewed 06/17/24 Reviewed 08/21/24 Before it gets bad. Once in pain It is harder to get rid of. Begin pain relief while you have mild pain.   Other   Other    ;  PRACTICE It is important to practice the strategies so we can determine if they will be effective in helping to reach the goal.    Follow these specific recommendations:        If strategy  does not work the first time, try it again.  We may make some changes over the next few sessions.    We may make some changes over the next few sessions, based on how they work.    [name], MSN, RN, BSN [facility], [facility] Communities RN Case Manager for Aging Gracefully Direct Dial: [phone]       08/21/24  Assessment: Mr. Gaglio reports pain 8 out of 10. States Tylenol  helps but has not taken it since this morning. 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 Reports slight allergy symptoms afterwards. Denies any major concerns at this  time.  Interventions: Demonstrated upper body AG home exercises. Discussed referral for [facility] CCM program. Declined referral at this time. Provided DM education booklet. Encouraged to take Tylenol .  Plan: Scheduled next joint home visit for 09/19/24 at 3 pm.  [name], MSN, RN, BSN [facility], [facility] Communities RN Case Manager for Aging Gracefully Direct Dial: [phone]                                                              Our next appointment is on 09/19/24 at 3pm.   If you are experiencing a Mental Health or Behavioral Health Crisis or need someone to talk to, please call the Suicide and Crisis Lifeline: 988 call the [place]  National Suicide Prevention Lifeline: 5486483242 or TTY: 236-151-4966 TTY (380)488-1325) to talk to a trained counselor call 1-800-273-TALK (toll free, 24 hour hotline) go to [facility] [address], [place] [phone]) call 911   The patient verbalized understanding of instructions, educational materials, and care plan provided today and agreed to receive a mailed copy of patient instructions, educational materials, and care plan.   [name], MSN, RN, BSN [place]  [facility], [facility] Communities RN Case Manager for Aging Gracefully Direct Dial: [phone]

## 2024-08-21 NOTE — Patient Outreach (Signed)
 Aging Gracefully Program  RN Visit  08/21/2024  [name] [birth year] [id number]  Visit:  RN Visit Number: 2- Second Visit  RN TIME CALCULATION: Start TIme:  RN Start Time Calculation: 1400 End Time:  RN Stop Time Calculation: 1430 Total Minutes:  RN Time Calculation: 30  Readiness To Change Score:  Readiness to Change Score: 9.33  Universal RN Interventions: Calendar Distribution: No Exercise Review: Yes Medications: Yes Medication Changes: No Mood: Yes Pain: Yes PCP Advocacy/Support: No Fall Prevention: Yes Incontinence: Yes Clinician View Of Client Situation: Arrived for joint home visit for [name] and spouse. [name] answered door. Propels w/c independently. Home with clutter in corners of room. 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 Client View Of His/Her Situation: [name] rates pain 8 out of 10. States he is tired.  Healthcare Provider Communication: Did Surveyor, mining With [employer] Provider?: No Healthcare Provider Response According to RN: n/a According to Client, Did PCP Report Communication With An Aging Gracefully RN?: No Healthcare Provider Response According To Client: n/a  Clinician View of Client Situation: Clinician View Of Client Situation: Arrived for joint home visit for [name] and spouse. [name] answered door. Propels w/c independently. Home with clutter in corners of room. 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 Client's View of His/Her Situation: Client View Of His/Her Situation: [name] rates pain 8 out of 10. States he is tired.  Medication Assessment: reviewed    OT Update: pending CHS assessments and contracts  Session Summary: [name] less talkative during visit. Did not appear to be feeling well. Rates pain 8 out of 10.    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  Reports pain 5/6 out of 10.  Takes medications as prescribed. [name] reports being independent. He also cooks for both he and his wife. [name] has a sense of humor and likes to joke around with his wife. Denies any major concerns during visit.   Interventions: Provided chronic disease management booklet, paper calendar, and writer's contact information. Discussed pain management strategies.   Plan: Scheduled next home visit on July 29 at 11 am.   CLIENT/RN ACTION PLAN - PAIN  Registered Nurse:  [name] Date: 06/17/24  Client Name: [name] Client ID:    Target Area:  PAIN   Why Problem May Occur: chronic pain    Target Goal: decrease pain level to 5 or below out of 10 over the next 160 days.   STRATEGIES Coping Strategies Ideas:  Heat Reviewed 06/17/24 Reviewed 08/21/24 Use heating pad or warm towel on painful area no more than 20 minutes at a time. Don't sleep with a heating pad on - it could burn your skin   Ice Reviewed 06/17/24 Reviewed 08/21/24 Use ice pack or frozen bag of vegetables on painful area.   Leave cold pack on for less than 20 minutes. Ice can burn your skin.  Don't leave ice on longer than 20 minutes.   Activity and Exercise Reviewed 06/17/24 Reviewed upper body AG home exercises 08/21/24 Joints get stiff when not in use Aging Gracefully Exercises Walking (inside or outside) [employer]:  cooking, cleaning, Building surveyor Massage  Listen to Music Reviewed 06/17/24 Reviewed 08/21/24 Listening to music can decrease pain. Turn  off the TV and turn on the radio. Distract with play station game  Prayer/Meditation Reviewed 06/17/24 Reviewed 08/21/24 Prayer and meditation can decrease pain   Other   Acetaminophen /Tylenol  (same medication) Reviewed 06/17/24 Reviewed 08/21/24 DO NOT take more Acetaminophen  than below because it can be bad for your liver. 500 mg. tablets:  2 tablets every 8 hours, as needed for pain.  Do not take more than 6, (500 mg) tablets every  day. 300 mg. tablets:  2 tablets every 6 hours, as needed for pain.  Do not take more than 8 (325 mg) tablets every day. Look for Acetaminophen /Tylenol  in other medicines you buy over the counter.   Still in Pain Reviewed 06/17/24 Reviewed 08/21/24 There ae a lot of different kinds of pain medicines:  creams, patches and supplements. Ask your Healthcare Provider about other pain medications.   Stop Smoking Reviewed 06/17/24 Smoking can make arthritis worse.   Stop Pain Reviewed 06/17/24 Reviewed 08/21/24 Before it gets bad. Once in pain It is harder to get rid of. Begin pain relief while you have mild pain.   Other   Other    ;  PRACTICE It is important to practice the strategies so we can determine if they will be effective in helping to reach the goal.    Follow these specific recommendations:        If strategy does not work the first time, try it again.  We may make some changes over the next few sessions.    We may make some changes over the next few sessions, based on how they work.    Brian Hurst, MSN, RN, BSN [facility], Healthy Communities RN Case Manager for Aging Gracefully Direct Dial: [phone]       08/21/24  Assessment: Mr. Syme reports pain 8 out of 10. States Tylenol  helps but has not taken it since this morning. 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 Reports slight allergy symptoms afterwards. Denies any major concerns at this time.  Interventions: Demonstrated upper body AG home exercises. Discussed referral for [facility] CCM program. Declined referral at this time. Provided DM education booklet. Encouraged to take Tylenol .  Plan: Scheduled next joint home visit for 09/19/24 at 3 pm.  Brian Hurst, MSN, RN, BSN [place]  [facility], Healthy Communities RN Case Manager for Aging Gracefully Direct Dial: [phone]

## 2024-08-26 ENCOUNTER — Other Ambulatory Visit: Payer: Self-pay | Admitting: Podiatry

## 2024-08-27 ENCOUNTER — Encounter: Payer: Self-pay | Admitting: Physician Assistant

## 2024-08-31 ENCOUNTER — Other Ambulatory Visit: Payer: Self-pay | Admitting: Podiatry

## 2024-09-02 ENCOUNTER — Telehealth: Payer: Self-pay

## 2024-09-02 DIAGNOSIS — R1013 Epigastric pain: Secondary | ICD-10-CM

## 2024-09-02 DIAGNOSIS — G546 Phantom limb syndrome with pain: Secondary | ICD-10-CM

## 2024-09-02 DIAGNOSIS — F32A Depression, unspecified: Secondary | ICD-10-CM

## 2024-09-05 NOTE — Telephone Encounter (Signed)
 Can you call this patient to confirm they need this medication as it was recently filled at the beginning of this month. Thanks!

## 2024-09-05 NOTE — Telephone Encounter (Signed)
 I think it is some confusion, looks like he got a 90 day supply in August but we still receive a fax for a refill from pharmacy. Unsure how that works. Teale Goodgame [place], CMA

## 2024-09-06 ENCOUNTER — Other Ambulatory Visit: Payer: Self-pay

## 2024-09-06 ENCOUNTER — Ambulatory Visit (INDEPENDENT_AMBULATORY_CARE_PROVIDER_SITE_OTHER)

## 2024-09-06 VITALS — BP 136/89 | HR 96 | Ht 67.0 in

## 2024-09-06 DIAGNOSIS — R1013 Epigastric pain: Secondary | ICD-10-CM | POA: Diagnosis not present

## 2024-09-06 DIAGNOSIS — G546 Phantom limb syndrome with pain: Secondary | ICD-10-CM | POA: Diagnosis not present

## 2024-09-06 DIAGNOSIS — R0609 Other forms of dyspnea: Secondary | ICD-10-CM

## 2024-09-06 DIAGNOSIS — I70209 Unspecified atherosclerosis of native arteries of extremities, unspecified extremity: Secondary | ICD-10-CM

## 2024-09-06 MED ORDER — METFORMIN HCL 1000 MG PO TABS: 1000.0000 mg | ORAL_TABLET | Freq: Two times a day (BID) | ORAL | 3 refills | Status: AC

## 2024-09-06 MED ORDER — GABAPENTIN 100 MG PO CAPS: 100.0000 mg | ORAL_CAPSULE | Freq: Every day | ORAL | 0 refills | Status: DC

## 2024-09-06 MED ORDER — AMLODIPINE BESYLATE 5 MG PO TABS: 5.0000 mg | ORAL_TABLET | Freq: Every day | ORAL | 3 refills | Status: DC

## 2024-09-06 MED ORDER — GABAPENTIN 300 MG PO CAPS: 300.0000 mg | ORAL_CAPSULE | Freq: Three times a day (TID) | ORAL | 1 refills | Status: DC

## 2024-09-06 MED ORDER — ALBUTEROL SULFATE HFA 108 (90 BASE) MCG/ACT IN AERS: 2.0000 | INHALATION_SPRAY | Freq: Four times a day (QID) | RESPIRATORY_TRACT | 0 refills | Status: DC | PRN

## 2024-09-06 NOTE — Patient Instructions (Signed)
 Thank you for visiting the clinic today, it was good to see you!  Today we talked about: - Increasing Gabapentin  to help with your symptoms - Please take Gabapentin  300 mg (1 capsule) 3 times per day. Continue taking Gabapentin  100mg  at bedtime. Let me know either by phone or MyChart if this is helping with your symptoms. We can always adjust your medications if needed. - Decrease Pantoprazole  from 80 mg (2 tablets) daily to 40 mg (1 tablet) daily. Please let me know if your symptoms return. - I have sent refills of albuterol , metformin , amlodipine  and gabapentin  to your pharmacy.  For any questions, please call the office at [phone] or send me a message in MyChart.  It was a pleasure to take care of you today. Have a great day!  Jullian Previti, DO [facility] Resident, PGY-1  ---------------------------------------------------------------------------------------------------------------------  Do you need your medications delivered to your home?   We'll send your prescription to the [facility] for delivery.          Address: [address] [place], [place], [place] [address]          Phone: [phone]  Please call the [facility] to speak with a pharmacist and set up your home medication delivery. If you have any questions, feel free to contact us  -- we're happy to help!  Other [facility] that offer affordable prices on both prescriptions and over-the-counter items, as well as convenient services like vaccinations, are  [facility], at [facility]         Address:  [address], [place], [place] [address]         Phone: [phone]  [facility], located in the [facility]        Address: [address], [place], [place] [address]        Phone: [phone]  [facility], at [facility]       Address: [address],  [place], [place] [address]       Phone: [phone]  [facility], at [facility] [address]       Address: [address], [address] Floor, [place], [place] [address]       Phone: [phone]

## 2024-09-06 NOTE — Progress Notes (Signed)
 In error

## 2024-09-09 NOTE — Assessment & Plan Note (Addendum)
 Epigastric pan is well-controlled on Pantoprazole  80mg  daily. Patient has been taking this dose since May 2025. Do not want to continue this medication long-term given increased risk of Alzheimer's. Given patient is not longer experiencing pain, will decrease Pantoprazole  to 40mg  daily. Previous imaging showed mild pancreatitis and fatty liver. Patient has an appointment with GI in October for further management. - Decrease Pantoprazole  from 80mg  daily to 40mg  daily

## 2024-09-09 NOTE — Assessment & Plan Note (Signed)
 Not controlled on current regimen which includes Gabapentin  300mg  BID and Gabapentin  100mg  at bedtime. Patient has room to increase dosage on Gabapentin . - Increase gabapentin  300 mg to 3 times per day. Continue taking gabapentin  100 mg at bedtime - Encouraged patient to let me know whether this increase is working for his symptoms. Will adjust as necessary

## 2024-09-09 NOTE — Progress Notes (Signed)
    SUBJECTIVE:   CHIEF COMPLAINT / HPI:   [name] is a 73 y.o.male who presents with increased phantom limb pain. States his sx are bothering him more often and he finds that they keep him awake at night as well. Has to get up within an hour of going to sleep due to sx. Patient is currently taking Gabapentin  300mg  BID, sometimes TID if needed and Gabapentin  100mg  daily at bedtime.  Abdominal pain is also improved with Pantoprazole  80mg  daily. No other concerns. Has an appointment with GI scheduled for 10/16/24.  PERTINENT  PMH / PSH: HTN, T2DM, diabetic neuropathy  OBJECTIVE:   BP 136/89   Pulse 96   Ht 5' 7 (1.702 m)   SpO2 99%   BMI 24.28 kg/m    General: Alert, well-appearing male in NAD.  HEENT: Normocephalic, atraumatic. Neck: Supple, normal ROM, no LAD Cardiovascular: RRR, S1/S2 normal. No murmurs, rubs, or gallops appreciated. Pulmonary: Normal work of breathing. CTAB with no wheezes or crackles present Abdomen: Soft, non-tender, non-distended Extremities: b/l AKA, healthy in appearance.  Neurologic: No focal deficits. Skin: Warm and dry. Psych: Appropriate mood and affect  ASSESSMENT/PLAN:   Assessment & Plan Phantom limb pain (HCC) Not controlled on current regimen which includes Gabapentin  300mg  BID and Gabapentin  100mg  at bedtime. Patient has room to increase dosage on Gabapentin . - Increase gabapentin  300 mg to 3 times per day. Continue taking gabapentin  100 mg at bedtime - Encouraged patient to let me know whether this increase is working for his symptoms. Will adjust as necessary Epigastric pain Epigastric pan is well-controlled on Pantoprazole  80mg  daily. Patient has been taking this dose since May 2025. Do not want to continue this medication long-term given increased risk of Alzheimer's. Given patient is not longer experiencing pain, will decrease Pantoprazole  to 40mg  daily. Previous imaging showed mild pancreatitis and fatty liver. Patient has an  appointment with GI in October for further management. - Decrease Pantoprazole  from 80mg  daily to 40mg  daily   Refills sent for albuterol , metformin , amlodipine , and gabapentin .  Harmani Neto, DO [facility]

## 2024-09-19 ENCOUNTER — Encounter: Admitting: *Deleted

## 2024-09-19 ENCOUNTER — Other Ambulatory Visit: Payer: Self-pay | Admitting: *Deleted

## 2024-09-19 NOTE — Patient Outreach (Signed)
 Aging Gracefully Program  09/19/2024  [name] 1951/09/15 [id number]   Arrived for joint Aging Gracefully RN home visit for [name] and wife.  However, [name] states [name] (wife) is not feeling well. [name] requests writer reschedule visit for another time. Discussed Clinical research associate will call tomorrow to reschedule.   [name], MSN, RN, BSN [place]  [facility], Healthy Communities RN Case Manager for Aging Gracefully Direct Dial: [phone]

## 2024-09-20 ENCOUNTER — Other Ambulatory Visit: Payer: Self-pay | Admitting: *Deleted

## 2024-09-20 NOTE — Patient Outreach (Signed)
 Aging Gracefully Program  09/20/2024  EARLY STEEL Dec 08, 1951 [id number]  Telephone call made to Mr. Kucinski to schedule joint home AG RN visit for both Mr. Im and spouse. No answer. HIPAA compliant voicemail message left requesting return call.   [name], MSN, RN, BSN [place]  [facility] [address], Healthy Communities RN Case Manager for Aging Gracefully Direct Dial: [phone]

## 2024-09-27 ENCOUNTER — Other Ambulatory Visit: Payer: Self-pay | Admitting: *Deleted

## 2024-09-27 NOTE — Patient Outreach (Signed)
 Aging Gracefully Program  09/27/2024  [name] 11/06/1951 [id number]   Telephone call made to [name] to schedule joint AG RN home visit with [name] and spouse. [name] endorses [name] (wife) is feeling better now. Agreeable to next home visit for Wednesday, October 15th at 2 pm.   [name], MSN, RN, BSN [place]  [facility], Healthy Communities RN Case Manager for Aging Gracefully Direct Dial: [phone]

## 2024-10-09 ENCOUNTER — Other Ambulatory Visit: Payer: Self-pay | Admitting: *Deleted

## 2024-10-09 NOTE — Patient Outreach (Signed)
 Aging Gracefully Program  10/09/2024  FREDIE MAJANO 1951/02/17 [id number]    Telephone call received from [name] (spouse) to cancel today's joint home visit with AG RN. [name] reports Mr. Duell' brother's wife passed this morning. States Mr. Lindbloom is very upset. [name] agreeable to writer calling back next week to reschedule for the end of October.   [name], MSN, RN, BSN [place]  [facility], Healthy Communities RN Case Manager for Aging Gracefully Direct Dial: [phone]

## 2024-10-14 ENCOUNTER — Other Ambulatory Visit: Payer: Self-pay | Admitting: *Deleted

## 2024-10-14 NOTE — Patient Outreach (Signed)
 Aging Gracefully Program  10/14/2024  [name] [birth year] [id number]   Telephone call made to [name] to schedule next Aging Gracefully RN joint home visit with [name] and [name]. Last week's home visit was cancelled due to death in their family. HIPAA compliant voicemail message left requesting return call.  [name], MSN, RN, BSN   [facility], Healthy Communities RN Case Manager for Aging Gracefully Direct Dial: [phone]

## 2024-10-15 ENCOUNTER — Other Ambulatory Visit: Payer: Self-pay | Admitting: *Deleted

## 2024-10-15 NOTE — Patient Outreach (Signed)
 Aging Gracefully Program  10/15/2024  [name] 1951/08/12 [id number]   Telephone call made [name] to schedule joint Aging Gracefully RN home visit for [name] and spouse [name].  Next home visit scheduled for October 29th at 2 pm.    [name], MSN, RN, BSN [place]  [facility], Healthy Communities RN Case Manager for Aging Gracefully Direct Dial: [phone]

## 2024-10-15 NOTE — Progress Notes (Deleted)
 10/15/2024 [name] [id number] [birth year]  Referring provider: Jerrie Gathers, DO Primary GI doctor: {[id number]}  ASSESSMENT AND PLAN:  History of pancreatitis, epigastric pain with history of GERD and [facility] 11/2020 EGD 07/11/2024 CTAP [employer] fat stranding on pancreatic head suggesting mild pancreatitis no ductal dilation, CAD severe aortic atherosclerosis aortic valve calcifications 07/30/2024 ABUS diffuse increased echogenicity of hepatic parenchyma gallbladder unremarkable normal CBD  History of IDA  Personal history of colon polyps 11/2020 colonoscopy  Constipation  Peripheral arterial disease  Type 2 diabetes history of PAD and neuropathy status post right and left AKA  Fatty liver 07/30/2024 ABUS diffuse increased echogenicity of hepatic parenchyma gallbladder unremarkable normal CBD  Patient Care Team: Jerrie Gathers, DO as PCP - General (Family Medicine) Rollin Dover, MD as Consulting Physician (Gastroenterology) Kristie Lamprey, MD as Consulting Physician (Gastroenterology) Cleatus Collar, MD as Consulting Physician (Ophthalmology) Eliza Lonni RAMAN, MD (Inactive) as Consulting Physician (Vascular Surgery) Jason Heather DEL, OTR as Occupational Therapist (Occupational Therapy) Shona Pablo MATSU, RN as Case Manager  HISTORY OF PRESENT ILLNESS: 73 y.o. male with a past medical history listed below presents for evaluation of ***.   Previously saw Dr. Dewey last time 11/03/2022 for iron  deficiency anemia, hernia constipation and GERD  *** Discussed the use of AI scribe software for clinical note transcription with the patient, who gave verbal consent to proceed.  History of Present Illness            He  reports that he has been smoking cigars. He has been exposed to tobacco smoke. He has never used smokeless tobacco. He reports current alcohol use of about 2.0 standard drinks of alcohol per week. He reports that he does not use drugs.  RELEVANT GI  HISTORY, IMAGING AND LABS: Results          CBC    Component Value Date/Time   WBC 5.8 05/17/2024 1118   WBC 10.0 10/04/2023 0354   RBC 6.40 (H) 05/17/2024 1118   RBC 4.25 10/04/2023 0354   HGB 13.8 05/17/2024 1118   HCT 47.1 05/17/2024 1118   PLT 368 05/17/2024 1118   MCV 74 (L) 05/17/2024 1118   MCH 21.6 (L) 05/17/2024 1118   MCH 21.4 (L) 10/04/2023 0354   MCHC 29.3 (L) 05/17/2024 1118   MCHC 29.1 (L) 10/04/2023 0354   RDW 18.8 (H) 05/17/2024 1118   LYMPHSABS 1.9 05/17/2024 1118   MONOABS 0.7 09/21/2023 1329   EOSABS 0.1 05/17/2024 1118   BASOSABS 0.0 05/17/2024 1118   Recent Labs    05/17/24 1118  HGB 13.8    CMP     Component Value Date/Time   NA 139 05/17/2024 1118   K 4.3 05/17/2024 1118   CL 98 05/17/2024 1118   CO2 20 05/17/2024 1118   GLUCOSE 116 (H) 05/17/2024 1118   GLUCOSE 145 (H) 09/29/2023 0433   BUN 9 05/17/2024 1118   CREATININE 0.80 07/11/2024 1124   CREATININE 0.94 03/31/2015 1454   CALCIUM  9.6 05/17/2024 1118   PROT 7.4 05/17/2024 1118   ALBUMIN  4.4 05/17/2024 1118   AST 13 05/17/2024 1118   ALT 10 05/17/2024 1118   ALKPHOS 115 05/17/2024 1118   BILITOT 0.2 05/17/2024 1118   GFRNONAA >60 09/29/2023 0433   GFRAA 106 11/30/2020 0944      Latest Ref Rng & Units 05/17/2024   11:18 AM 08/17/2023    5:23 AM 08/09/2023    7:00 AM  Hepatic Function  Total Protein 6.0 - 8.5  g/dL 7.4  6.3  8.4   Albumin  3.8 - 4.8 g/dL 4.4  2.3  3.2   AST 0 - 40 IU/L 13  23  19    ALT 0 - 44 IU/L 10  16  13    Alk Phosphatase 44 - 121 IU/L 115  52  86   Total Bilirubin 0.0 - 1.2 mg/dL 0.2  0.3  0.4       Current Medications:   Current Outpatient Medications (Endocrine & Metabolic):    empagliflozin  (JARDIANCE ) 10 MG TABS tablet, Take 1 tablet (10 mg total) by mouth daily.   metFORMIN  (GLUCOPHAGE ) 1000 MG tablet, Take 1 tablet (1,000 mg total) by mouth 2 (two) times daily with a meal.  Current Outpatient Medications (Cardiovascular):    amLODipine   (NORVASC ) 5 MG tablet, Take 1 tablet (5 mg total) by mouth daily.   atorvastatin  (LIPITOR) 40 MG tablet, Take 1 tablet (40 mg total) by mouth daily.   benazepril  (LOTENSIN ) 5 MG tablet, TAKE 1 TABLET BY MOUTH EVERY DAY   ezetimibe  (ZETIA ) 10 MG tablet, TAKE 1 TABLET BY MOUTH EVERY DAY  Current Outpatient Medications (Respiratory):    albuterol  (VENTOLIN  HFA) 108 (90 Base) MCG/ACT inhaler, Inhale 2 puffs into the lungs every 6 (six) hours as needed for wheezing or shortness of breath.  Current Outpatient Medications (Analgesics):    acetaminophen  (TYLENOL ) 325 MG tablet, Take 1-2 tablets (325-650 mg total) by mouth every 4 (four) hours as needed for mild pain (or temp >/= 101 F).   aspirin  EC 81 MG tablet, Take 1 tablet (81 mg total) by mouth daily. Swallow whole.   Current Outpatient Medications (Other):    DULoxetine  (CYMBALTA ) 60 MG capsule, Take 1 capsule (60 mg total) by mouth daily.   feeding supplement (ENSURE ENLIVE / ENSURE PLUS) LIQD, Take 237 mLs by mouth 2 (two) times daily between meals.   gabapentin  (NEURONTIN ) 100 MG capsule, Take 1 capsule (100 mg total) by mouth at bedtime.   gabapentin  (NEURONTIN ) 300 MG capsule, Take 1 capsule (300 mg total) by mouth 3 (three) times daily.   melatonin 3 MG TABS tablet, Take 2 tablets (6 mg total) by mouth at bedtime.   nicotine  (NICODERM CQ  - DOSED IN MG/24 HR) 7 mg/24hr patch, Place 1 patch (7 mg total) onto the skin daily.   pantoprazole  (PROTONIX ) 40 MG tablet, Take 2 tablets (80 mg total) by mouth daily.   polyethylene glycol (MIRALAX  / GLYCOLAX ) 17 g packet, Take 17 g by mouth daily as needed.   sucralfate  (CARAFATE ) 1 g tablet, Take 1 tablet (1 g total) by mouth 4 (four) times daily -  with meals and at bedtime. (Patient not taking: Reported on 09/06/2024)  Medical History:  Past Medical History:  Diagnosis Date   Adhesive capsulitis 03/10/2020   Allergy    Anemia    Angiodysplasia of small intestine 03/29/2018   Enteroscopy was  significant for angiodysplasia 03/29/2018   Arthritis    OA   Cervical radiculopathy    Dr. Unice neurosurgery   Chronic lower back pain    Claudication of both lower extremities 07/15/2015   Clotting disorder    COPD (chronic obstructive pulmonary disease) (HCC)    Critical limb ischemia of right lower extremity (HCC) 08/09/2023   Critical lower limb ischemia (HCC) 12/19/2019   Diabetes mellitus    takes Metformin  daily   Gangrene of right foot (HCC) 10/07/2023   GERD (gastroesophageal reflux disease)    takes Protonix  daily  Glaucoma    History of blood transfusion    related to low HgB ((09/10/2015   Hyperlipidemia    takes Vytorin  daily   Hypertension    takes Benazepril  and Bystolic  daily   Imaging of gastrointestinal tract abnormal 03/30/2021   PAD (peripheral artery disease)    Pneumonia    Radiculopathy, cervical region 02/11/2017   Shortness of breath dyspnea    with exertion   Tobacco user    Toe fracture, right 05/09/2011   Wears glasses    Allergies:  Allergies  Allergen Reactions   Glipizide  Other (See Comments)    REACTION IS SIDE EFFECT Severe hypoglycemia to 40s.      Surgical History:  He  has a past surgical history that includes Anterior cervical decomp/discectomy fusion (03/08/12); Vascular surgery (~ 2007); Back surgery (1996); Lumbar disc surgery (1996); Inguinal hernia repair (1990's); Anterior cervical decomp/discectomy fusion (03/08/2012); Colonoscopy w/ biopsies and polypectomy (08/17/2012); Esophagogastroduodenoscopy (08/17/2012); Cardiac catheterization (N/A, 07/30/2015); lower extremity angiogram (Bilateral, 07/30/2015); Givens capsule study (N/A, 11/24/2015); enteroscopy (N/A, 12/11/2015); Femoral-popliteal Bypass Graft (Left, 01/06/2016); Intraoperative arteriogram (Left, 01/06/2016); Vein harvest (Left, 01/06/2016); Cardiac catheterization (N/A, 10/24/2016); Cardiac catheterization (N/A, 10/24/2016); ir generic historical (10/24/2016); Bypass graft  femoral-peroneal (Left, 11/11/2016); Intraoperative arteriogram (Left, 11/11/2016); ABDOMINAL AORTOGRAM W/LOWER EXTREMITY (N/A, 12/11/2017); PERIPHERAL VASCULAR INTERVENTION (Right, 12/11/2017); IR Angiogram Follow Up Study (12/11/2017); enteroscopy (N/A, 03/29/2018); Hot hemostasis (N/A, 03/29/2018); Embolectomy (Left, 12/19/2019); Patch angioplasty (Left, 12/19/2019); ABDOMINAL AORTOGRAM W/LOWER EXTREMITY (Bilateral, 04/22/2020); Endarterectomy femoral (Right, 04/24/2020); Patch angioplasty (Right, 04/24/2020); Colonoscopy with propofol  (N/A, 12/05/2020); biopsy (12/05/2020); Esophagogastroduodenoscopy (egd) with propofol  (N/A, 12/05/2020); Above knee leg amputaton (Left, 02/19/2021); Amputation (Left, 02/19/2021); ABDOMINAL AORTOGRAM W/LOWER EXTREMITY (N/A, 07/27/2023); PERIPHERAL VASCULAR INTERVENTION (Right, 07/27/2023); Bypass graft femoral-peroneal (Right, 08/09/2023); Vein harvest (Right, 08/09/2023); ABDOMINAL AORTOGRAM W/LOWER EXTREMITY (Right, 09/25/2023); AORTIC ARCH ANGIOGRAPHY (N/A, 09/25/2023); Upper Extremity Angiography (09/25/2023); and Amputation (Right, 09/27/2023). Family History:  His family history includes Cancer in his mother and sister; Diabetes in his brother, mother, and sister; Heart disease in his father; Hyperlipidemia in his mother; Hypertension in his father.  REVIEW OF SYSTEMS  : All other systems reviewed and negative except where noted in the History of Present Illness.  PHYSICAL EXAM: There were no vitals taken for this visit. Physical Exam          Alan JONELLE Coombs, PA-C 8:06 AM

## 2024-10-16 ENCOUNTER — Ambulatory Visit: Admitting: Physician Assistant

## 2024-10-23 ENCOUNTER — Other Ambulatory Visit: Payer: Self-pay | Admitting: *Deleted

## 2024-10-23 ENCOUNTER — Encounter: Payer: Self-pay | Admitting: *Deleted

## 2024-10-23 NOTE — Patient Instructions (Signed)
 Visit Information  Thank you for taking time to visit with me today. Please don't hesitate to contact me if I can be of assistance to you before our next scheduled home appointment.  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  Reports pain 5/6 out of 10. Takes medications as prescribed. [name] reports being independent. He also cooks for both he and his wife. [name] has a sense of humor and likes to joke around with his wife. Denies any major concerns during visit.   Interventions: Provided chronic disease management booklet, paper calendar, and writer's contact information. Discussed pain management strategies.   Plan: Scheduled next home visit on July 29 at 11 am.   CLIENT/RN ACTION PLAN - PAIN  Registered Nurse:  [name] Date: 06/17/24  Client Name: [name] Client ID:    Target Area:  PAIN   Why Problem May Occur: chronic pain    Target Goal: decrease pain level to 5 or below out of 10 over the next 160 days.   STRATEGIES Coping Strategies Ideas:  Heat Reviewed 06/17/24 Reviewed 08/21/24 Reviewed 10/23/24 Use heating pad or warm towel on painful area no more than 20 minutes at a time. Don't sleep with a heating pad on - it could burn your skin   Ice Reviewed 06/17/24 Reviewed 08/21/24 Use ice pack or frozen bag of vegetables on painful area.   Leave cold pack on for less than 20 minutes. Ice can burn your skin.  Don't leave ice on longer than 20 minutes.   Activity and Exercise Reviewed 06/17/24 Reviewed upper body AG home exercises 08/21/24 Reviewed 10/23/24 Joints get stiff when not in use Aging Gracefully Exercises Walking (inside or outside) [employer]:  cooking, cleaning, Building Surveyor Massage  Listen to Music Reviewed 06/17/24 Reviewed  08/21/24 Reviewed 10/23/24 Listening to music can decrease pain. Turn off the TV and turn on the radio. Distract with play station game  Prayer/Meditation Reviewed 06/17/24 Reviewed 08/21/24 Reviewed 10/23/24 Prayer and meditation can decrease pain   Other   Acetaminophen /Tylenol  (same medication) Reviewed 06/17/24 Reviewed 08/21/24 Reviewed 10/23/24 DO NOT take more Acetaminophen  than below because it can be bad for your liver. 500 mg. tablets:  2 tablets every 8 hours, as needed for pain.  Do not take more than 6, (500 mg) tablets every day. 300 mg. tablets:  2 tablets every 6 hours, as needed for pain.  Do not take more than 8 (325 mg) tablets every day. Look for Acetaminophen /Tylenol  in other medicines you buy over the counter.   Still in Pain Reviewed 06/17/24 Reviewed 08/21/24 Reviewed 10/23/24 There ae a lot of different kinds of pain medicines:  creams, patches and supplements. Ask your Healthcare Provider about other pain medications.   Stop Smoking Reviewed 06/17/24 Reviewed 10/23/24 Smoking can make arthritis worse.   Stop Pain Reviewed 06/17/24 Reviewed 08/21/24 Reviewed 10/23/24 Before it gets bad. Once in pain It is harder to get rid of. Begin pain relief while you have mild pain.   Other   Other    ;  PRACTICE It is important to practice the strategies so we can determine if they will be effective in helping to reach the goal.  Follow these specific recommendations:        If strategy does not work the first time, try it again.  We may make some changes over the next few sessions.    We may make some changes over the next few sessions, based on how they work.    [name], MSN, RN, BSN [facility], Healthy Communities RN Case Manager for Aging Gracefully Direct Dial: [phone]       08/21/24  Assessment: [name] reports pain 8 out of 10. States Tylenol  helps but has not taken it since this morning. 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 Reports slight allergy symptoms afterwards. Denies any major concerns at this time.  Interventions: Demonstrated upper body AG home exercises. Discussed referral for [facility] CCM program. Declined referral at this time. Provided DM education booklet. Encouraged to take Tylenol .  Plan: Scheduled next joint home visit for 09/19/24 at 3 pm.  [name], MSN, RN, BSN [place]  [facility], Healthy Communities RN Case Manager for Aging Gracefully Direct Dial: [phone]      10/23/24  Assessment: [name] reports pain 9 out of 10. States he mentioned his uncontrolled phantom pain at last appointment and PCP did not change medications. Reports sister in law recently passed. Reports not sleeping well. Reports missing last GI appointment due to transportation service not coming to pick him up. Denies having any GI issues currently.   Interventions: Encouraged Mr. Bosher to consider trying heat. Discussed referral to [facility] CCM team. Mr. Goya declined. Encouraged Mr. Amico to consistently take Melatonin.  Plan: Scheduled next home visit for Nov. 25th at 2 pm.   [name], MSN, RN, BSN [place]  [facility], Healthy Communities RN Case Manager for Aging Gracefully Direct Dial: [phone]                                                                                     Our next appointment is on Nov. 25th at 2pm.  If you are experiencing a Mental Health or Behavioral Health Crisis or need someone to talk to, please call the Suicide and Crisis Lifeline: 988 call the [place]  National Suicide Prevention Lifeline: 973-879-8876 or TTY: 614-655-7121 TTY 865-424-2164) to talk to a trained counselor call 1-800-273-TALK (toll free, 24 hour hotline) go to [facility] [address], [place]  [phone]) call 911   The patient verbalized understanding of instructions, educational materials, and care plan provided today and agreed to receive a mailed copy of patient instructions, educational materials, and care plan.   [name], MSN, RN, BSN [place]  [facility], Healthy Communities RN Case Manager for Aging Gracefully Direct Dial: [phone]

## 2024-10-23 NOTE — Patient Outreach (Signed)
 Aging Gracefully Program  RN Visit  10/23/2024  [name] 06-02-1951 [id number]  Visit:  RN Visit Number: 3- Third Visit  RN TIME CALCULATION: Start TIme:  RN Start Time Calculation: 1400 End Time:  RN Stop Time Calculation: 1430 Total Minutes:  RN Time Calculation: 30  Readiness To Change Score:  Readiness to Change Score: 8.33  Universal RN Interventions: Calendar Distribution: No Exercise Review: Yes Medications: Yes Medication Changes: No Mood: Yes Pain: Yes PCP Advocacy/Support: No Fall Prevention: Yes Incontinence: Yes Clinician View Of Client Situation: Arrived for joint home visit. [name] sitting in wheelchair upon arrival. Living room area neat. Client View Of His/Her Situation: [name] reports phantom pain 9 out of 10. Sister in law recently passed.  Healthcare Provider Communication: Did Surveyor, Mining [employer] Provider?: No Healthcare Provider Response According to RN: n/a According to Client, Did PCP Report Communication With An Aging Gracefully RN?: No Healthcare Provider Response According To Client: n/a  Clinician View of Client Situation: Clinician View Of Client Situation: Arrived for joint home visit. [name] sitting in wheelchair upon arrival. Living room area neat. Client's View of His/Her Situation: Client View Of His/Her Situation: [name] reports phantom pain 9 out of 10. Sister in law recently passed.  Medication Assessment: Reviewed.    OT Update: Pending [facility] contracts and assessments.  Session Summary: [name] sister in law recently passed. They were close. Condolences offered.     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  Reports pain 5/6 out of 10. Takes medications as prescribed. [name] reports being independent. He also cooks for both he and his wife. [name] has  a sense of humor and likes to joke around with his wife. Denies any major concerns during visit.   Interventions: Provided chronic disease management booklet, paper calendar, and writer's contact information. Discussed pain management strategies.   Plan: Scheduled next home visit on July 29 at 11 am.   CLIENT/RN ACTION PLAN - PAIN  Registered Nurse:  [name] Date: 06/17/24  Client Name: [name] Client ID:    Target Area:  PAIN   Why Problem May Occur: chronic pain    Target Goal: decrease pain level to 5 or below out of 10 over the next 160 days.   STRATEGIES Coping Strategies Ideas:  Heat Reviewed 06/17/24 Reviewed 08/21/24 Reviewed 10/23/24 Use heating pad or warm towel on painful area no more than 20 minutes at a time. Don't sleep with a heating pad on - it could burn your skin   Ice Reviewed 06/17/24 Reviewed 08/21/24 Use ice pack or frozen bag of vegetables on painful area.   Leave cold pack on for less than 20 minutes. Ice can burn your skin.  Don't leave ice on longer than 20 minutes.   Activity and Exercise Reviewed 06/17/24 Reviewed upper body AG home exercises 08/21/24 Reviewed 10/23/24 Joints get stiff when not in use Aging Gracefully Exercises Walking (inside or outside) [employer]:  cooking, cleaning, Building Surveyor Massage  Listen to Music Reviewed 06/17/24 Reviewed 08/21/24 Reviewed 10/23/24 Listening to music can decrease pain. Turn off the TV and turn on the radio. Distract with play station game  Prayer/Meditation Reviewed 06/17/24 Reviewed 08/21/24 Reviewed 10/23/24 Prayer  and meditation can decrease pain   Other   Acetaminophen /Tylenol  (same medication) Reviewed 06/17/24 Reviewed 08/21/24 Reviewed 10/23/24 DO NOT take more Acetaminophen  than below because it can be bad for your liver. 500 mg. tablets:  2 tablets every 8 hours, as needed for pain.  Do not take more than 6, (500 mg) tablets every day. 300 mg. tablets:  2  tablets every 6 hours, as needed for pain.  Do not take more than 8 (325 mg) tablets every day. Look for Acetaminophen /Tylenol  in other medicines you buy over the counter.   Still in Pain Reviewed 06/17/24 Reviewed 08/21/24 Reviewed 10/23/24 There ae a lot of different kinds of pain medicines:  creams, patches and supplements. Ask your Healthcare Provider about other pain medications.   Stop Smoking Reviewed 06/17/24 Reviewed 10/23/24 Smoking can make arthritis worse.   Stop Pain Reviewed 06/17/24 Reviewed 08/21/24 Reviewed 10/23/24 Before it gets bad. Once in pain It is harder to get rid of. Begin pain relief while you have mild pain.   Other   Other    ;  PRACTICE It is important to practice the strategies so we can determine if they will be effective in helping to reach the goal.    Follow these specific recommendations:        If strategy does not work the first time, try it again.  We may make some changes over the next few sessions.    We may make some changes over the next few sessions, based on how they work.    [name], MSN, RN, BSN [facility], Healthy Communities RN Case Manager for Aging Gracefully Direct Dial: [phone]       08/21/24  Assessment: [name] reports pain 8 out of 10. States Tylenol  helps but has not taken it since this morning. 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 Reports slight allergy symptoms afterwards. Denies any major concerns at this time.  Interventions: Demonstrated upper body AG home exercises. Discussed referral for [facility]I CCM program. Declined referral at this time. Provided DM education booklet. Encouraged to take Tylenol .  Plan: Scheduled next joint home visit for 09/19/24 at 3 pm.  [name], MSN, RN, BSN [place]  [facility], Healthy Communities RN Case Manager for Aging Gracefully Direct Dial: [phone]       10/23/24  Assessment: [name] reports pain 9 out of 10. States he mentioned his uncontrolled phantom pain at last appointment and PCP did not change medications. Reports sister in law recently passed. Reports not sleeping well. Reports missing last GI appointment due to transportation service not coming to pick him up. Denies having any GI issues currently.   Interventions: Encouraged Mr. Gloss to consider trying heat. Discussed referral to [facility] CCM team. Mr. Girardin declined. Encouraged Mr. Fiero to consistently take Melatonin.  Plan: Scheduled next home visit for Nov. 25th at 2 pm.   [name], MSN, RN, BSN [place]  [facility], Healthy Communities RN Case Manager for Aging Gracefully Direct Dial: [phone]  [name], MSN, RN, BSN [place]  [facility], Healthy Communities RN Case Manager for Aging Gracefully Direct Dial: [phone]

## 2024-10-28 ENCOUNTER — Other Ambulatory Visit: Payer: Self-pay

## 2024-10-28 DIAGNOSIS — E1151 Type 2 diabetes mellitus with diabetic peripheral angiopathy without gangrene: Secondary | ICD-10-CM

## 2024-10-28 DIAGNOSIS — R0609 Other forms of dyspnea: Secondary | ICD-10-CM

## 2024-10-28 DIAGNOSIS — E7800 Pure hypercholesterolemia, unspecified: Secondary | ICD-10-CM

## 2024-10-29 ENCOUNTER — Other Ambulatory Visit: Payer: Self-pay | Admitting: *Deleted

## 2024-10-29 DIAGNOSIS — R1013 Epigastric pain: Secondary | ICD-10-CM

## 2024-10-29 DIAGNOSIS — G546 Phantom limb syndrome with pain: Secondary | ICD-10-CM

## 2024-10-29 DIAGNOSIS — F32A Depression, unspecified: Secondary | ICD-10-CM

## 2024-10-29 MED ORDER — PANTOPRAZOLE SODIUM 40 MG PO TBEC: 40.0000 mg | DELAYED_RELEASE_TABLET | Freq: Every day | ORAL | 1 refills | Status: AC

## 2024-10-29 MED ORDER — BENAZEPRIL HCL 5 MG PO TABS: 5.0000 mg | ORAL_TABLET | Freq: Every day | ORAL | 3 refills | Status: AC

## 2024-10-29 MED ORDER — DULOXETINE HCL 60 MG PO CPEP: 60.0000 mg | ORAL_CAPSULE | Freq: Every day | ORAL | 3 refills | Status: AC

## 2024-11-14 ENCOUNTER — Other Ambulatory Visit: Payer: Self-pay

## 2024-11-15 MED ORDER — AMLODIPINE BESYLATE 5 MG PO TABS: 5.0000 mg | ORAL_TABLET | Freq: Every day | ORAL | 3 refills | Status: AC

## 2024-11-15 MED ORDER — GABAPENTIN 100 MG PO CAPS: 100.0000 mg | ORAL_CAPSULE | Freq: Every day | ORAL | 0 refills | Status: AC

## 2024-11-15 MED ORDER — GABAPENTIN 300 MG PO CAPS: 300.0000 mg | ORAL_CAPSULE | Freq: Three times a day (TID) | ORAL | 1 refills | Status: DC

## 2024-11-19 ENCOUNTER — Other Ambulatory Visit: Payer: Self-pay | Admitting: *Deleted

## 2024-11-19 ENCOUNTER — Encounter: Payer: Self-pay | Admitting: *Deleted

## 2024-11-19 DIAGNOSIS — I10 Essential (primary) hypertension: Secondary | ICD-10-CM

## 2024-11-20 NOTE — Patient Outreach (Signed)
 Aging Gracefully Program  RN Visit  11/20/2024  [name] 05-08-1951 [id number]  Visit:  RN Visit Number: 4- Fourth Visit  RN TIME CALCULATION: Start TIme:  RN Start Time Calculation: 1400 End Time:  RN Stop Time Calculation: 1500 Total Minutes:  RN Time Calculation: 60  Readiness To Change Score:  Readiness to Change Score: 8.67  Universal RN Interventions: Calendar Distribution: No Exercise Review: Yes Medications: Yes Medication Changes: No Mood: Yes Pain: Yes PCP Advocacy/Support: Yes Fall Prevention: Yes Incontinence: Yes Clinician View Of Client Situation: Arrived for joint home visit for [name] and spouse. CHS was present finishing up on home modifications Client View Of His/Her Situation: [name] pleased with CHS work. Expresses ongoing unrelieved pain. Reports pain 10 out of 10.  Healthcare Provider Communication: Did Surveyor, Mining With Client's Healthcare Provider?: Yes Method Of Communication: [employer] message sent to Dr. Jerrie to make aware of [name] unrelieved pain with current medications) Healthcare Provider Response According to RN: pending response According to Client, Did PCP Report Communication With An Aging Gracefully RN?: No Healthcare Provider Response According To Client: pending response  Clinician View of Client Situation: Clinician View Of Client Situation: Arrived for joint home visit for [name] and spouse. CHS was present finishing up on home modifications Client's View of His/Her Situation: Client View Of His/Her Situation: [name] pleased with CHS work. Expresses ongoing unrelieved pain. Reports pain 10 out of 10.  Medication Assessment: Reviewed    OT Update: Pending CHS' completion.  Session Summary: Brian. [name] is pleased with CHS and Aging Gracefully program. Pleased with CHS home modifications and repairs.   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  Reports pain 5/6 out of 10. Takes medications as prescribed. [name] reports being independent. He also cooks for both he and his wife. [name] has a sense of humor and likes to joke around with his wife. Denies any major concerns during visit.   Interventions: Provided chronic disease management booklet, paper calendar, and writer's contact information. Discussed pain management strategies.   Plan: Scheduled next home visit on July 29 at 11 am.   CLIENT/RN ACTION PLAN - PAIN  Registered Nurse:  [name] Date: 06/17/24  Client Name: [name] Client ID:    Target Area:  PAIN   Why Problem May Occur: chronic pain    Target Goal: decrease pain level to 5 or below out of 10 over the next 160 days.   STRATEGIES Coping Strategies Ideas:  Heat Reviewed 06/17/24 Reviewed 08/21/24 Reviewed 10/23/24 Reviewed 11/19/24 Use heating pad or warm towel on painful area no more than 20 minutes at a time. Don't sleep with a heating pad on - it could burn your skin   Ice Reviewed 06/17/24 Reviewed 08/21/24 Use ice pack or frozen bag of vegetables on painful area.   Leave cold pack on for less than 20 minutes. Ice can burn your skin.  Don't leave ice on longer than 20 minutes.   Activity and Exercise Reviewed 06/17/24 Reviewed upper body AG home exercises 08/21/24 Reviewed 10/23/24 Reviewed 11/19/24 Joints get stiff when not in use Aging Gracefully Exercises Walking (inside or outside) [employer]:  cooking, education officer, environmental, Building Surveyor Massage  Listen  to Music Reviewed 06/17/24 Reviewed 08/21/24 Reviewed 10/23/24 Reviewed 11/19/24 Listening to music can decrease pain. Turn off the TV and turn on the radio. Distract with play station game  Prayer/Meditation Reviewed 06/17/24 Reviewed 08/21/24 Reviewed 10/23/24 Reviewed 11/19/24 Prayer and meditation can decrease pain    Other   Acetaminophen /Tylenol  (same medication) Reviewed 06/17/24 Reviewed 08/21/24 Reviewed 10/23/24 Reviewed 11/19/24 DO NOT take more Acetaminophen  than below because it can be bad for your liver. 500 mg. tablets:  2 tablets every 8 hours, as needed for pain.  Do not take more than 6, (500 mg) tablets every day. 300 mg. tablets:  2 tablets every 6 hours, as needed for pain.  Do not take more than 8 (325 mg) tablets every day. Look for Acetaminophen /Tylenol  in other medicines you buy over the counter.   Still in Pain Reviewed 06/17/24 Reviewed 08/21/24 Reviewed 10/23/24 Reviewed 11/19/24 There ae a lot of different kinds of pain medicines:  creams, patches and supplements. Ask your Healthcare Provider about other pain medications.   Stop Smoking Reviewed 06/17/24 Reviewed 10/23/24 Reviewed 11/19/24 Smoking can make arthritis worse.   Stop Pain Reviewed 06/17/24 Reviewed 08/21/24 Reviewed 10/23/24 Reviewed 11/19/24 Before it gets bad. Once in pain It is harder to get rid of. Begin pain relief while you have mild pain.   Other   Other    ;  PRACTICE It is important to practice the strategies so we can determine if they will be effective in helping to reach the goal.    Follow these specific recommendations:        If strategy does not work the first time, try it again.  We may make some changes over the next few sessions.    We may make some changes over the next few sessions, based on how they work.    [name], MSN, RN, BSN [facility], Healthy Communities RN Case Manager for Aging Gracefully Direct Dial: [phone]       08/21/24  Assessment: [name] reports pain 8 out of 10. States Tylenol  helps but has not taken it since this morning. 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 Reports slight allergy symptoms afterwards. Denies any major concerns at this time.  Interventions: Demonstrated  upper body AG home exercises. Discussed referral for [facility] CCM program. Declined referral at this time. Provided DM education booklet. Encouraged to take Tylenol .  Plan: Scheduled next joint home visit for 09/19/24 at 3 pm.  [name], MSN, RN, BSN [place]  [facility], Healthy Communities RN Case Manager for Aging Gracefully Direct Dial: [phone]      10/23/24  Assessment: [name] reports pain 9 out of 10. States he mentioned his uncontrolled phantom pain at last appointment and PCP did not change medications. Reports sister in law recently passed. Reports not sleeping well. Reports missing last GI appointment due to transportation service not coming to pick him up. Denies having any GI issues currently.   Interventions: Encouraged [name] to consider trying heat. Discussed referral to [facility] CCM team. [name] declined. Encouraged [name] to consistently take Melatonin.  Plan: Scheduled next home visit for Nov. 25th at 2 pm.   [name], MSN, RN, BSN [facility], Healthy Communities RN Case Manager for Aging Gracefully Direct Dial: [phone]        11/19/24  Assessment: [name] reports pain 10 out of 10. He has  run out of Gabapentin . Next refill by pharmacy for Gabapentin  will be 11/28/24. [name] denies falls. Reports transportation issues. States the last transportation company insurance arranged for GI appointment, did not show up. States he is need for transportation assistance.  Interventions: Discussed [facility] CCM referral. [name] agreeable. Discussed referral for [facility]. [name] declined. States [facility] is too close to his home and he rather stay with his current doctors. Writer contacted [employer] for transportation assistance application to be mailed to Brian. [place] home. Sent inbasket message to Dr. Jerrie about Brian. Nodarse' unrelieved pain and running out of Gabapentin  before it is time for refill. Discussed  this is writer's last home visit.   Plan: Refer to [facility] CCM team for complex care management and SDOH transportation needs.   Goal:  not met   [name], MSN, RN, BSN [place]  [facility], Healthy Communities RN Case Manager for Aging Gracefully Direct Dial: [phone]                                                                                                       [name], MSN, RN, BSN   [facility], Healthy Communities RN Case Manager for Aging Gracefully Direct Dial: [phone]

## 2024-11-20 NOTE — Patient Instructions (Signed)
 Visit Information  Thank you for taking time to visit with me today. Please don't hesitate to contact me if I can be of assistance to you before our next scheduled home appointment.  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  Reports pain 5/6 out of 10. Takes medications as prescribed. [name] reports being independent. He also cooks for both he and his wife. [name] has a sense of humor and likes to joke around with his wife. Denies any major concerns during visit.   Interventions: Provided chronic disease management booklet, paper calendar, and writer's contact information. Discussed pain management strategies.   Plan: Scheduled next home visit on July 29 at 11 am.   CLIENT/RN ACTION PLAN - PAIN  Registered Nurse:  [name] Date: 06/17/24  Client Name: [name] Client ID:    Target Area:  PAIN   Why Problem May Occur: chronic pain    Target Goal: decrease pain level to 5 or below out of 10 over the next 160 days.   STRATEGIES Coping Strategies Ideas:  Heat Reviewed 06/17/24 Reviewed 08/21/24 Reviewed 10/23/24 Reviewed 11/19/24 Use heating pad or warm towel on painful area no more than 20 minutes at a time. Don't sleep with a heating pad on - it could burn your skin   Ice Reviewed 06/17/24 Reviewed 08/21/24 Use ice pack or frozen bag of vegetables on painful area.   Leave cold pack on for less than 20 minutes. Ice can burn your skin.  Don't leave ice on longer than 20 minutes.   Activity and Exercise Reviewed 06/17/24 Reviewed upper body AG home exercises 08/21/24 Reviewed 10/23/24 Reviewed 11/19/24 Joints get stiff when not in use Aging Gracefully Exercises Walking (inside or outside) [employer]:  cooking, cleaning, Building Surveyor Massage  Listen  to Music Reviewed 06/17/24 Reviewed 08/21/24 Reviewed 10/23/24 Reviewed 11/19/24 Listening to music can decrease pain. Turn off the TV and turn on the radio. Distract with play station game  Prayer/Meditation Reviewed 06/17/24 Reviewed 08/21/24 Reviewed 10/23/24 Reviewed 11/19/24 Prayer and meditation can decrease pain   Other   Acetaminophen /Tylenol  (same medication) Reviewed 06/17/24 Reviewed 08/21/24 Reviewed 10/23/24 Reviewed 11/19/24 DO NOT take more Acetaminophen  than below because it can be bad for your liver. 500 mg. tablets:  2 tablets every 8 hours, as needed for pain.  Do not take more than 6, (500 mg) tablets every day. 300 mg. tablets:  2 tablets every 6 hours, as needed for pain.  Do not take more than 8 (325 mg) tablets every day. Look for Acetaminophen /Tylenol  in other medicines you buy over the counter.   Still in Pain Reviewed 06/17/24 Reviewed 08/21/24 Reviewed 10/23/24 Reviewed 11/19/24 There ae a lot of different kinds of pain medicines:  creams, patches and supplements. Ask your Healthcare Provider about other pain medications.   Stop Smoking Reviewed 06/17/24 Reviewed 10/23/24 Reviewed 11/19/24 Smoking can make arthritis worse.   Stop Pain Reviewed 06/17/24 Reviewed 08/21/24 Reviewed 10/23/24 Reviewed 11/19/24 Before it gets bad. Once in pain It is harder to get rid of. Begin pain relief while you have mild pain.   Other   Other    ;  PRACTICE It is important to practice  the strategies so we can determine if they will be effective in helping to reach the goal.    Follow these specific recommendations:        If strategy does not work the first time, try it again.  We may make some changes over the next few sessions.    We may make some changes over the next few sessions, based on how they work.    [name], MSN, RN, BSN [facility], Healthy Communities RN Case Manager for Aging Gracefully Direct Dial:  [phone]       08/21/24  Assessment: [name] reports pain 8 out of 10. States Tylenol  helps but has not taken it since this morning. 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 Reports slight allergy symptoms afterwards. Denies any major concerns at this time.  Interventions: Demonstrated upper body AG home exercises. Discussed referral for [facility] CCM program. Declined referral at this time. Provided DM education booklet. Encouraged to take Tylenol .  Plan: Scheduled next joint home visit for 09/19/24 at 3 pm.  [name], MSN, RN, BSN [place]  [facility], Healthy Communities RN Case Manager for Aging Gracefully Direct Dial: [phone]      10/23/24  Assessment: [name] reports pain 9 out of 10. States he mentioned his uncontrolled phantom pain at last appointment and PCP did not change medications. Reports sister in law recently passed. Reports not sleeping well. Reports missing last GI appointment due to transportation service not coming to pick him up. Denies having any GI issues currently.   Interventions: Encouraged [name] to consider trying heat. Discussed referral to [facility] CCM team. Mr. Strahm declined. Encouraged [name] to consistently take Melatonin.  Plan: Scheduled next home visit for Nov. 25th at 2 pm.   [name], MSN, RN, BSN [facility], Healthy Communities RN Case Manager for Aging Gracefully Direct Dial: [phone]        11/19/24  Assessment: [name] reports pain 10 out of 10. He has run out of Gabapentin . Next refill by pharmacy for Gabapentin  will be 11/28/24. [name] denies falls. Reports transportation issues. States the last transportation company insurance arranged for GI appointment, did not show up. States he is need for transportation assistance.  Interventions: Discussed [facility] CCM referral. [name] agreeable. Discussed referral for [facility]. [name]  declined. States [facility] is too close to his home and he rather stay with his current doctors. Writer contacted [employer] for transportation assistance application to be mailed to [name] home. Sent inbasket message to Dr. Jerrie about Mr. Middleton' unrelieved pain and running out of Gabapentin  before it is time for refill. Discussed this is writer's last home visit.   Plan: Refer to [facility] CCM team for complex care management and SDOH transportation needs.   Goal:  not met   [name], MSN, RN, BSN [place]  [facility], Healthy Communities RN Case Manager for Aging Gracefully Direct Dial: [phone]  If you are experiencing a Mental Health or Behavioral Health Crisis or need someone to talk to, please call the Suicide and Crisis Lifeline: 988 call the [place]  National Suicide Prevention Lifeline: 250-641-6967 or TTY: (415)745-7717 TTY 601-014-4944) to talk to a trained counselor call 1-800-273-TALK (toll free, 24 hour hotline) go to [facility] [address], [place] [phone]) call 911   The patient verbalized understanding of instructions, educational materials, and care plan provided today and agreed to receive a mailed copy of patient instructions, educational materials, and care plan.   [name], MSN, RN, BSN [place]  [facility], Healthy Communities RN Case Manager for Aging Gracefully Direct Dial: [phone]

## 2024-11-26 ENCOUNTER — Other Ambulatory Visit: Admitting: Specialist

## 2024-11-26 NOTE — Patient Outreach (Signed)
 Aging Gracefully Program  OT Follow-Up Visit  11/26/2024  JOSUA FERREBEE 08-09-1951 [id number]  Visit:  2- Second Visit  Start Time:  1700 End Time:  1716 Total Minutes:  16  Goals:   Goals Addressed             This Visit's Progress    Patient Stated   On track    Patient will improve safety entering and exiting to his back yard.  Name _________________________________             Date__________________  OT ACTION PLAN: Functional Mobility  Target Problem Area:   Unable to exit back door.  Why Problem May Occur:     WC can not go over large step in and out of home.              Target Goal(s):   Safely enter and exit back of home.     STRATEGIES   Saving Your Energy DO:  Take breaks  Raise the height of surfaces   Remove tripping hazards  (Other):   (Other):   (Other):     Modifying your home environment and making it safe    DO:  Install grab bars in the bathroom  Remove or strongly secure throw rugs  X install threshold ramps so that power chair will easily roll over threshold allowing for safe entry and exit of home.   (Other):   (Other):     Simplifying the way you set up tasks or daily routines DO:  Move slowly   (Other):   (Other):   (Other):   (Other):      PRACTICE  Based on what we have talked about, you are willing to try:  __________safely mobilize wc over _threshold ramps._____________________________________________________  ________________________________________________________________  If a strategy does not work the first time, try it again (and again).  We may make some changes over the next few sessions, based on how they work.   Landry Elbe, MHA, OT/L        11/26/24 ________________________________________________   __________ Occupational Therapist          Date            Post Clinical Reasoning: Client Action (Goal) One Interventions: Improve safety entering and exiting home. Did  Client Try?: Yes Targeted Problem Area Status: A Lot Better Clinician View Of Client Situation:: Mr. Razzano states that the threshold ramps make exiting backdoor much easier and safer. Client View Of His/Her Situation:: Not feeling well today but still able to participate in visit.  endorses that he is very happy with back door modification. Next Visit Plan:: problem solve goal 2  Heather HILARIO Elbe, MHA, OT/L [phone]

## 2024-11-29 ENCOUNTER — Other Ambulatory Visit: Payer: Self-pay

## 2024-11-29 DIAGNOSIS — R0609 Other forms of dyspnea: Secondary | ICD-10-CM

## 2024-12-03 ENCOUNTER — Other Ambulatory Visit: Payer: Self-pay | Admitting: Specialist

## 2024-12-04 ENCOUNTER — Telehealth: Payer: Self-pay

## 2024-12-04 NOTE — Progress Notes (Signed)
 Complex Care Management Note Care Guide Note  12/04/2024 Name: [name] MRN: [id number] DOB: 12-31-1950   Complex Care Management Outreach Attempts: An unsuccessful telephone outreach was attempted today to offer the patient information about available complex care management services.  Follow Up Plan:  Additional outreach attempts will be made to offer the patient complex care management information and services.   Encounter Outcome:  No Answer-Left voicemail  Leotis Rase [facility], [facility] Guide  Direct Dial: [phone]  Fax [phone]

## 2024-12-05 ENCOUNTER — Telehealth: Payer: Self-pay

## 2024-12-05 NOTE — Telephone Encounter (Signed)
 Patient calls nurse line regarding gabapentin  300 mg rx. He reports that pharmacy does not have refill for him.   Called pharmacy. When they initially tried to fill, refill was too soon. They ran the prescription today and was able to process and fill. Advised that rx would be ready in about one hour.   Called patient and provided with update.   Patient appreciative.   Chiquita JAYSON English, RN

## 2024-12-05 NOTE — Progress Notes (Signed)
 Complex Care Management Note  Care Guide Note 12/05/2024 Name: [name] MRN: [id number] DOB: 14-Jan-1951  [name] is a 73 y.o. year old male who sees Fernley, Virali, DO for primary care. I reached out to Alexa JINNY Moats by phone today to offer complex care management services.  Mr. Coate was given information about Complex Care Management services today including:   The Complex Care Management services include support from the care team which includes your Nurse Care Manager, Clinical Social Worker, or Pharmacist.  The Complex Care Management team is here to help remove barriers to the health concerns and goals most important to you. Complex Care Management services are voluntary, and the patient may decline or stop services at any time by request to their care team member.   Complex Care Management Consent Status: Patient agreed to services and verbal consent obtained.   Follow up plan:  Telephone appointment with complex care management team member scheduled for:  12/23/24 and 12/30/24  Encounter Outcome:  Patient Scheduled  Leotis Rase [facility], [facility] Guide  Direct Dial: [phone]  Fax [phone]

## 2024-12-06 NOTE — Patient Outreach (Signed)
 Aging Gracefully Program  OT FINAL Visit  12/03/2024  DIONYSIOS MASSMAN [birth year] [id number]  Visit:  4- Fourth Visit (second visit arrived and patient wished to not participate)  Start Time:  1600 End Time:  1620 Total Minutes:  20  Readiness to Change:  Readiness to Change Score: 10  Home Environment Assessment:    Durable Medical Equipment: Durable Medical Equipment: Tub Transfer Bench Durable Medical Equipment Distribution Date: 12/03/24  Patient Education: Education Provided: Yes Education Details: educated on use of ttb and tips for aging in place booklet. Person(s) Educated: Patient, Spouse Comprehension: Verbalized Understanding, Returned Demonstration  Goals:  Goals Addressed             This Visit's Progress    COMPLETED: Patient Stated   On track    Patient will improve safety entering and exiting to his back yard.  Name _________________________________             Date__________________  OT ACTION PLAN: Functional Mobility  Target Problem Area:   Unable to exit back door.  Why Problem May Occur:     WC can not go over large step in and out of home.              Target Goal(s):   Safely enter and exit back of home.     STRATEGIES   Saving Your Energy DO:  Take breaks  Raise the height of surfaces   Remove tripping hazards  (Other):   (Other):   (Other):     Modifying your home environment and making it safe    DO:  Install grab bars in the bathroom  Remove or strongly secure throw rugs  X install threshold ramps so that power chair will easily roll over threshold allowing for safe entry and exit of home.   (Other):   (Other):     Simplifying the way you set up tasks or daily routines DO:  Move slowly   (Other):   (Other):   (Other):   (Other):      PRACTICE  Based on what we have talked about, you are willing to try:  __________safely mobilize wc over _threshold  ramps._____________________________________________________  ________________________________________________________________  If a strategy does not work the first time, try it again (and again).  We may make some changes over the next few sessions, based on how they work.   Landry Elbe, MHA, OT/L        11/26/24 ________________________________________________   __________ Occupational Therapist          Date         COMPLETED: Patient Stated   On track    Patient will improve mobility in and out of bedroom and bathroom.   Name __________________________________   Date________________  OT ACTION PLAN: Bathing  Target Problem Area:   Difficulty entering and exiting tub  Why Problem May Occur:     Bilateral amputee - must complete sliding board transfer.  Current tub seat does not extend over tub wall.          Target Goal(s):   Improve safety and independence with transfer in/out of tub.   STRATEGIES   Saving Your Energy DO:  X Use a tub bench/seat   Use a long-handled sponge  Keep frequently used items within easy reach  (Other):   (Other):   (Other):     Modifying your home environment and making it safe     DO:  X Install grab bars in the shower  and next to the toilet  X Place a rubber mat the entire length of the tub  Make sure the bathroom is well-lit  (Other):   (Other):   (Other):    Simplifying the way you set up tasks or daily routines DO:  Plan to bathe/shower before you're overly tired  Gather everything you will need before beginning   (Other):   (Other):   (Other):    PRACTICE  Based on what we have talked about, you are willing to try:  ___use tub transfer bench versus shower seat for increased ease with tub transfers._____________________________________________________________  ________________________________________________________________  If an idea does not work the first time, try it again (and  again).  We may make some changes over the next few sessions, based on how they work.    Landry Elbe, MHA, OT/L        12/03/24 ___________________________________________________   ___________ Occupational Therapist          Date         Post Clinical Reasoning: Client Action (Goal) Two Interventions: Paitent will improve safety transferring in and out of tub.  - OT educated on use of TTB and patient able to complete transfer with mod I in and out of tub. Did Client Try?: Yes Targeted Problem Area Status: A Lot Better Clinician View Of Client Situation:: Mr. Capri demonstrated independence with his transfer in/out of shower using tub transfer bench.  We discussed how to modify shower curtain - patient wishes to wait on this modifcaiton.  Overall, he is very pleased with his current state. Client View Of His/Her Situation:: Still not feeling the best.  Thrilled with new ttb for improved ease and safety with shower transfers.  He is excited that his family will be coming to visit for the holidays. Feels he has met all of his OT goals. Next Visit Plan:: DC from Aging Gracefully program this date.  All OT goals have been met and patient is pleased with his current state.  Heather HILARIO Elbe, MHA, OT/L [phone]

## 2024-12-17 ENCOUNTER — Emergency Department (HOSPITAL_COMMUNITY)

## 2024-12-17 ENCOUNTER — Ambulatory Visit (INDEPENDENT_AMBULATORY_CARE_PROVIDER_SITE_OTHER)

## 2024-12-17 ENCOUNTER — Emergency Department (HOSPITAL_COMMUNITY)
Admission: EM | Admit: 2024-12-17 | Discharge: 2024-12-17 | Disposition: A | Discharge status: Home / Self Care | Attending: Emergency Medicine | Admitting: Emergency Medicine

## 2024-12-17 ENCOUNTER — Other Ambulatory Visit: Payer: Self-pay

## 2024-12-17 ENCOUNTER — Encounter (HOSPITAL_COMMUNITY): Payer: Self-pay

## 2024-12-17 VITALS — BP 73/53 | HR 84

## 2024-12-17 DIAGNOSIS — E119 Type 2 diabetes mellitus without complications: Secondary | ICD-10-CM | POA: Diagnosis not present

## 2024-12-17 DIAGNOSIS — Z89512 Acquired absence of left leg below knee: Secondary | ICD-10-CM | POA: Insufficient documentation

## 2024-12-17 DIAGNOSIS — I10 Essential (primary) hypertension: Secondary | ICD-10-CM | POA: Insufficient documentation

## 2024-12-17 DIAGNOSIS — Z7982 Long term (current) use of aspirin: Secondary | ICD-10-CM | POA: Insufficient documentation

## 2024-12-17 DIAGNOSIS — K828 Other specified diseases of gallbladder: Secondary | ICD-10-CM | POA: Diagnosis not present

## 2024-12-17 DIAGNOSIS — Z89511 Acquired absence of right leg below knee: Secondary | ICD-10-CM | POA: Diagnosis not present

## 2024-12-17 DIAGNOSIS — Z79899 Other long term (current) drug therapy: Secondary | ICD-10-CM | POA: Diagnosis not present

## 2024-12-17 DIAGNOSIS — K760 Fatty (change of) liver, not elsewhere classified: Secondary | ICD-10-CM | POA: Diagnosis not present

## 2024-12-17 DIAGNOSIS — R1011 Right upper quadrant pain: Secondary | ICD-10-CM | POA: Diagnosis not present

## 2024-12-17 DIAGNOSIS — R195 Other fecal abnormalities: Secondary | ICD-10-CM | POA: Diagnosis not present

## 2024-12-17 DIAGNOSIS — Z7984 Long term (current) use of oral hypoglycemic drugs: Secondary | ICD-10-CM | POA: Insufficient documentation

## 2024-12-17 DIAGNOSIS — K838 Other specified diseases of biliary tract: Secondary | ICD-10-CM | POA: Diagnosis not present

## 2024-12-17 DIAGNOSIS — K8020 Calculus of gallbladder without cholecystitis without obstruction: Secondary | ICD-10-CM | POA: Diagnosis not present

## 2024-12-17 LAB — COMPREHENSIVE METABOLIC PANEL WITH GFR
ALT: 6 U/L (ref 0–44)
AST: 17 U/L (ref 15–41)
Albumin: 3.9 g/dL (ref 3.5–5.0)
Alkaline Phosphatase: 78 U/L (ref 38–126)
Anion gap: 11 (ref 5–15)
BUN: 10 mg/dL (ref 8–23)
CO2: 23 mmol/L (ref 22–32)
Calcium: 9.2 mg/dL (ref 8.9–10.3)
Chloride: 103 mmol/L (ref 98–111)
Creatinine, Ser: 0.68 mg/dL (ref 0.61–1.24)
GFR, Estimated: >60 mL/min
Glucose, Bld: 101 mg/dL — ABNORMAL HIGH (ref 70–99)
Potassium: 4 mmol/L (ref 3.5–5.1)
Sodium: 136 mmol/L (ref 135–145)
Total Bilirubin: 0.3 mg/dL (ref 0.0–1.2)
Total Protein: 6.9 g/dL (ref 6.5–8.1)

## 2024-12-17 LAB — CBC WITH DIFFERENTIAL/PLATELET
Abs Immature Granulocytes: 0.01 K/uL (ref 0.00–0.07)
Basophils Absolute: 0 K/uL (ref 0.0–0.1)
Basophils Relative: 1 %
Eosinophils Absolute: 0.1 K/uL (ref 0.0–0.5)
Eosinophils Relative: 2 %
HCT: 42.3 % (ref 39.0–52.0)
Hemoglobin: 13.1 g/dL (ref 13.0–17.0)
Immature Granulocytes: 0 %
Lymphocytes Relative: 27 %
Lymphs Abs: 1.6 K/uL (ref 0.7–4.0)
MCH: 21.4 pg — ABNORMAL LOW (ref 26.0–34.0)
MCHC: 31 g/dL (ref 30.0–36.0)
MCV: 69.1 fL — ABNORMAL LOW (ref 80.0–100.0)
Monocytes Absolute: 0.8 K/uL (ref 0.1–1.0)
Monocytes Relative: 14 %
Neutro Abs: 3.3 K/uL (ref 1.7–7.7)
Neutrophils Relative %: 56 %
Platelets: 303 K/uL (ref 150–400)
RBC: 6.12 MIL/uL — ABNORMAL HIGH (ref 4.22–5.81)
RDW: 20.1 % — ABNORMAL HIGH (ref 11.5–15.5)
Smear Review: NORMAL
WBC: 5.8 K/uL (ref 4.0–10.5)
nRBC: 0 % (ref 0.0–0.2)

## 2024-12-17 LAB — LIPASE, BLOOD: Lipase: 34 U/L (ref 11–51)

## 2024-12-17 LAB — POC OCCULT BLOOD, ED: Fecal Occult Bld: NEGATIVE

## 2024-12-17 LAB — I-STAT CG4 LACTIC ACID, ED: Lactic Acid, Venous: 1.4 mmol/L (ref 0.5–1.9)

## 2024-12-17 MED ORDER — LACTATED RINGERS IV BOLUS: 1000.0000 mL | Freq: Once | INTRAVENOUS | Status: DC

## 2024-12-17 MED ORDER — MORPHINE SULFATE (PF) 4 MG/ML IV SOLN
4.0000 mg | Freq: Once | INTRAVENOUS | Status: AC
  Administered 2024-12-17: 4 mg via INTRAVENOUS
  Filled 2024-12-17: qty 1

## 2024-12-17 MED ORDER — PANTOPRAZOLE SODIUM 40 MG IV SOLR
40.0000 mg | Freq: Once | INTRAVENOUS | Status: AC
  Administered 2024-12-17: 40 mg via INTRAVENOUS
  Filled 2024-12-17: qty 10

## 2024-12-17 NOTE — ED Triage Notes (Signed)
 Patient comes via ems from PCP after abd pain x 3 weeks with black stools.  Patient hypotensive in the 80s with ems.  Alert and oriented X4.  Appears in no distress and asymptomatic with low BP.

## 2024-12-17 NOTE — ED Notes (Signed)
 Per provider at bedside patient NPO and holding fluids at this time.

## 2024-12-17 NOTE — ED Provider Notes (Signed)
 " [place] [facility] AT [facility] Provider Note   CSN: [id number] Arrival date & time: 12/17/24  1203     Patient presents with: GI Problem and Abdominal Pain   [name] is a 73 y.o. male with past medical history of hypertension, PAD, diabetes, bilateral status post above-knee amputation, who presents emergency department for evaluation of abdominal pain.  Patient reports right upper quadrant abdominal pain that has been worsening since Thanksgiving.  He is also reporting black tarry stools for the last 3 weeks.  He denies any nausea or vomiting.  Patient was reportedly hypotensive with EMS on the way here in the mid 80s, however his blood pressure has normalized since.  Patient is reporting significant pain that does wax and wane.  He denies any urinary symptoms.  He denies any back pain.  No fevers, chills, body aches.    GI Problem Associated symptoms include abdominal pain.  Abdominal Pain      Prior to Admission medications  Medication Sig Start Date End Date Taking? Authorizing Provider  acetaminophen  (TYLENOL ) 500 MG tablet Take 500 mg by mouth every 6 (six) hours as needed for mild pain (pain score 1-3) or moderate pain (pain score 4-6).   Yes [provider]  albuterol  (VENTOLIN  HFA) 108 (90 Base) MCG/ACT inhaler TAKE 2 PUFFS BY MOUTH EVERY 6 HOURS AS NEEDED FOR WHEEZE OR SHORTNESS OF BREATH 11/29/24  Yes Bhagat, Virali, DO  aspirin  EC 81 MG tablet Take 81 mg by mouth daily.   Yes [provider]  atorvastatin  (LIPITOR) 40 MG tablet TAKE 1 TABLET BY MOUTH EVERY DAY 10/28/24  Yes Bhagat, Virali, DO  baclofen  (LIORESAL ) 10 MG tablet Take 5 mg by mouth 2 (two) times daily as needed for muscle spasms.   Yes [provider]  benazepril  (LOTENSIN ) 5 MG tablet Take 1 tablet (5 mg total) by mouth daily. 10/29/24  Yes Bhagat, Virali, DO  Doxylamine Succinate, Sleep, (SLEEP AID PO) Take 1 tablet by mouth at bedtime as needed (sleep).    Yes [provider]  DULoxetine  (CYMBALTA ) 60 MG capsule Take 1 capsule (60 mg total) by mouth daily. 10/29/24  Yes Bhagat, Virali, DO  empagliflozin  (JARDIANCE ) 10 MG TABS tablet TAKE 1 TABLET BY MOUTH EVERY DAY 10/28/24  Yes Bhagat, Virali, DO  ezetimibe  (ZETIA ) 10 MG tablet TAKE 1 TABLET BY MOUTH EVERY DAY 03/08/24  Yes Joshua Domino, DO  feeding supplement (ENSURE ENLIVE / ENSURE PLUS) LIQD Take 237 mLs by mouth 2 (two) times daily between meals. Patient taking differently: Take 237 mLs by mouth in the morning, at noon, and at bedtime. 10/08/23  Yes Elicia Hamlet, MD  gabapentin  (NEURONTIN ) 100 MG capsule Take 1 capsule (100 mg total) by mouth at bedtime. 11/15/24 02/13/25 Yes Bhagat, Virali, DO  gabapentin  (NEURONTIN ) 300 MG capsule Take 1 capsule (300 mg total) by mouth 3 (three) times daily. 11/15/24  Yes Bhagat, Virali, DO  metFORMIN  (GLUCOPHAGE ) 1000 MG tablet Take 1 tablet (1,000 mg total) by mouth 2 (two) times daily with a meal. 09/06/24  Yes Bhagat, Virali, DO  pantoprazole  (PROTONIX ) 40 MG tablet Take 1 tablet (40 mg total) by mouth daily. 10/29/24  Yes Bhagat, Virali, DO  polyethylene glycol (MIRALAX  / GLYCOLAX ) 17 g packet Take 17 g by mouth daily as needed. Patient taking differently: Take 17 g by mouth daily. 05/17/24  Yes Joshua Domino, DO  amLODipine  (NORVASC ) 5 MG tablet Take 1 tablet (5 mg total) by mouth daily.  Patient not taking: Reported on 12/17/2024 11/15/24   Bhagat, Virali, DO  sucralfate  (CARAFATE ) 1 g tablet Take 1 tablet (1 g total) by mouth 4 (four) times daily -  with meals and at bedtime. Patient not taking: No sig reported 05/17/24   Joshua Domino, DO    Allergies: Glipizide     Review of Systems  Gastrointestinal:  Positive for abdominal pain.    Updated Vital Signs BP 121/89   Pulse 79   Temp 97.7 F (36.5 C) (Oral)   Resp (!) 22   Ht 5' 7 (1.702 m)   Wt 68 kg   SpO2 99%   BMI 23.49 kg/m   Physical Exam Vitals and nursing note reviewed.   Constitutional:      Appearance: Normal appearance.  HENT:     Head: Normocephalic and atraumatic.     Mouth/Throat:     Mouth: Mucous membranes are moist.  Eyes:     General: No scleral icterus.       Right eye: No discharge.        Left eye: No discharge.     Conjunctiva/sclera: Conjunctivae normal.  Cardiovascular:     Rate and Rhythm: Normal rate and regular rhythm.     Pulses: Normal pulses.  Pulmonary:     Effort: Pulmonary effort is normal.     Breath sounds: Normal breath sounds.  Abdominal:     General: There is no distension.     Tenderness: There is abdominal tenderness in the right upper quadrant. Positive signs include Murphy's sign.     Comments: Patient with positive Murphy sign on physical exam  Musculoskeletal:        General: No deformity.     Cervical back: Normal range of motion.     Comments: Status post bilateral BKA     Right Lower Extremity: Right leg is amputated below knee.     Left Lower Extremity: Left leg is amputated below knee.  Skin:    General: Skin is warm and dry.     Capillary Refill: Capillary refill takes less than 2 seconds.  Neurological:     Mental Status: He is alert.     Motor: No weakness.  Psychiatric:        Mood and Affect: Mood normal.     (all labs ordered are listed, but only abnormal results are displayed) Labs Reviewed  CBC WITH DIFFERENTIAL/PLATELET - Abnormal; Notable for the following components:      Result Value   RBC 6.12 (*)    MCV 69.1 (*)    MCH 21.4 (*)    RDW 20.1 (*)    All other components within normal limits  COMPREHENSIVE METABOLIC PANEL WITH GFR - Abnormal; Notable for the following components:   Glucose, Bld 101 (*)    All other components within normal limits  LIPASE, BLOOD  I-STAT CG4 LACTIC ACID, ED  POC OCCULT BLOOD, ED    EKG: None  Radiology: US  Abdomen Limited RUQ (LIVER/GB) Result Date: 12/17/2024 EXAM: Right Upper Quadrant Abdominal Ultrasound 12/17/2024 12:57:14 PM TECHNIQUE:  Real-time ultrasonography of the right upper quadrant of the abdomen was performed. COMPARISON: US  Abdomen 07/30/2024; 03/02/2023. CLINICAL HISTORY: Positive Murphy Sign. FINDINGS: LIVER: Diffuse hepatic steatosis. No intrahepatic biliary ductal dilatation. No evidence of mass. Hepatopetal flow in the portal vein. BILIARY SYSTEM: Fluid filled and distended gallbladder. Gallbladder wall thickness measures 2.4 mm. Moderate volume layering biliary sludge. No sonographic Murphy sign. The common bile duct measures 4.6 mm. No pericholecystic  fluid. OTHER: No right upper quadrant ascites. IMPRESSION: 1. Fluid-filled, distended gallbladder with moderate volume biliary sludge. No cholecystolithiasis or changes of acute cholecystitis. 2. Diffuse hepatic steatosis. Electronically signed by: Rogelia Myers MD 12/17/2024 02:34 PM EST RP Workstation: [id number]    Procedures   Medications Ordered in the ED  morphine  (PF) 4 MG/ML injection 4 mg (4 mg Intravenous Given 12/17/24 1232)  pantoprazole  (PROTONIX ) injection 40 mg (40 mg Intravenous Given 12/17/24 1349)                                 Medical Decision Making Amount and/or Complexity of Data Reviewed Labs: ordered. Radiology: ordered.  Risk Prescription drug management.   This patient presents to the ED for concern of abdominal pain and black stools, this involves an extensive number of treatment options, and is a complaint that carries with it a high risk of complications and morbidity.   Differential diagnosis includes: Cholelithiasis, cholecystitis, choledocholithiasis, abscess, pancreatitis, appendicitis, GI bleeding, PUD  Co morbidities:  hypertension, PAD, diabetes   Additional history: Patient was seen by his PCP earlier today for the same complaint.  His blood pressure with his PCP was 73/53 with a heart rate of 84.  He also had a positive Murphy sign at that time.  His PCP sent him to the ED for concern for upper GI bleed.  Lab  Tests:  I Ordered, and personally interpreted labs.  The pertinent results include: Negative Hemoccult, hemoglobin: 13   Imaging Studies:  I ordered imaging studies including right upper quadrant ultrasound I independently visualized and interpreted imaging which showed   Fluid-filled, distended gallbladder with moderate volume biliary sludge. No  cholecystolithiasis or changes of acute cholecystitis   I agree with the radiologist interpretation  Cardiac Monitoring/ECG:  The patient was maintained on a cardiac monitor.  I personally viewed and interpreted the cardiac monitored which showed an underlying rhythm of: Sinus rhythm  Medicines ordered and prescription drug management:  I ordered medication including  Medications  morphine  (PF) 4 MG/ML injection 4 mg (4 mg Intravenous Given 12/17/24 1232)  pantoprazole  (PROTONIX ) injection 40 mg (40 mg Intravenous Given 12/17/24 1349)   for pain management Reevaluation of the patient after these medicines showed that the patient improved I have reviewed the patients home medicines and have made adjustments as needed  Test Considered:   none  Critical Interventions:   none  Consultations Obtained: None  Problem List / ED Course:     ICD-10-CM   1. Symptomatic cholelithiasis  K80.20       MDM: 73 year old male who presents emergency department for evaluation of right upper quadrant pain this pain has been present for the last 3 to 4 weeks, since Thanksgiving and has gradually gotten worse.  Patient has a positive Murphy sign on my exam, so I ordered a RUQ ultrasound.  The ultrasound was positive for biliary sludge as well as some stranding, but no evidence of cholecystitis.  Additionally, patient was reporting dark stools with concern for GI bleed.  His Hemoccult was negative and his hemoglobin is stable at 13.  Recommendation for patient to follow-up with general surgery outpatient for possible cholecystectomy.  I also  recommended patient follow-up with GI if he continues to have concerns of dark stools.  Patient's vital signs are stable.  Patient is appropriate for discharge at this time.   Dispostion:  After consideration of the diagnostic results and the  patients response to treatment, I feel that the patient would benefit from supportive care and outpatient surgery follow-up.    Final diagnoses:  Symptomatic cholelithiasis    ED Discharge Orders     None          Torrence Marry GORMAN DEVONNA 12/18/24 DANIAL    Levander Houston, MD 12/23/24 1227  "

## 2024-12-17 NOTE — ED Notes (Signed)
 Patient back from US  denied discomfort, without noted distress.

## 2024-12-17 NOTE — Progress Notes (Signed)
" ° ° °  SUBJECTIVE:   CHIEF COMPLAINT / HPI:   [name] is a 73 y.o.male who presents today for abdominal pain and dark stools. Reports worsening R-sided abdominal pain under his ribs for the last 3 weeks. It feels like a stabbing pain rated 9/10. Worse when taking medications or eating so he has not been taking his medications. Does not think it is worse with fatty foods. Tylenol  helps some. Denies nausea, vomiting or hemoptysis. Denies fever.  Also reports black stools for the last few weeks. No BRBPR. Stools are sometimes runny, depends no what he eats. Had a GI appt in October which he was unable to attend because his transportation did not show. He had similar sx a few years ago but cannot recall what happened.   PERTINENT  PMH / PSH: HTN, PAD, T2DM, diabetic neuropathy, phantom limb pain, fatty liver  OBJECTIVE:   BP (!) 73/53   Pulse 84   SpO2 100%    General: Alert, ill-appearing male, visibly in pain Cardiovascular: RRR, no m/r/g appreciated. Pulmonary: Normal WOB. CTAB with no w/c/r present. Abdomen: significant TTP with guarding in RUQ, positive Murphy's. Soft, non-distended. Extremities: s/p b/l AKA   ASSESSMENT/PLAN:   Assessment & Plan Right upper quadrant abdominal pain Dark stools BP 73/53, HR 84. Patient visibly in pain with guarding on exam and positive Murphy's sign. Given hypotension and presenting sx, I am concerned for an upper GI bleed and believe he requires urgent evaluation in the ED. He is amenable to going via EMS which has been called. ED charge notified as well as FMTS.    Manasvi Dickard, DO [facility] "

## 2024-12-17 NOTE — Discharge Instructions (Addendum)
 It was a pleasure taking care of you today. You were seen in the Emergency Department for evaluation of abdominal pain and black stools. Your work-up was reassuring. Your CT/Xray/Labs showed no evidence of GI bleeding.  Your right upper quadrant ultrasound does show gallstones and inflammation in the gallbladder, but no evidence of infection.  I did give you a referral to general surgery for further evaluation and possible outpatient removal of your gallbladder.  Their contact information is provided in your discharge paperwork.  You will need to call them to schedule an appointment. Refer to the attached documentation for further management of your symptoms.  Additionally, there was no evidence of blood in your stool so there is no current concern for GI bleeding.  Please return to the ER if you experience chest pain, trouble breathing, intractable nausea/vomiting or any other life threatening illnesses.

## 2024-12-17 NOTE — ED Notes (Signed)
 Patient transported to [place]

## 2024-12-23 ENCOUNTER — Other Ambulatory Visit: Payer: Self-pay

## 2024-12-23 NOTE — Patient Outreach (Signed)
 Social Drivers of Health  Community Resource and Care Coordination Visit Note   12/23/2024  Name: [name] MRN: [id number] DOB:05/12/1951  Situation: Referral received for [facility] needs assessment and assistance related to Transportation. I obtained verbal consent from Patient.  Visit completed with Patient on the phone.   Background:   SDOH Interventions Today    Flowsheet Row Most Recent Value  SDOH Interventions   Food Insecurity Interventions Intervention Not Indicated  [No resources needed for food. Pt gets FNS.]  Housing Interventions Intervention Not Indicated  [no issues with housing.]  Transportation Interventions Patient Resources (Friends/Family), SCAT (Specialized Community Area Transporation)  [name] has a car but struggles to move patient in and out of the car due to mobility issues. BSW will work with patient to complete SCAT application.]  Utilities Interventions Intervention Not Indicated     Assessment:   Goals Addressed             This Visit's Progress    BSW       Current SDOH Barriers:  Transportation  Interventions: Patient interviewed and appropriate screenings performed Referred patient to community resources  Provided patient with information about [employer] [place] Discussed plans with patient for ongoing follow up and provided patient with direct contact number Advised patient to f/u with PCP about transportation application part B BSW completed part A of SCAT application with pt over the phone. BSW will e-fax part B to PCP office.           Recommendation:   attend all scheduled provider appointments call for transportation assistance at least one week before appointments ask for help if you don't understand your health insurance benefits Complete SCAT Application  Follow Up Plan:   Telephone follow up appointment date/time:  01/06/2025 at 10:30AM.  [name], BSW [place]/[facility] - [facility] Social  Worker [phone]

## 2024-12-23 NOTE — Patient Instructions (Signed)
 Visit Information  Thank you for taking time to visit with me today. Please don't hesitate to contact me if I can be of assistance to you before our next scheduled appointment.  Our next appointment is by telephone on 01/06/2025 at 10:30AM Please call the care guide team at [phone] if you need to cancel or reschedule your appointment.   Following is a copy of your care plan:   Goals Addressed             This Visit's Progress    BSW       Current SDOH Barriers:  Transportation  Interventions: Patient interviewed and appropriate screenings performed Referred patient to community resources  Provided patient with information about [employer] [place] Discussed plans with patient for ongoing follow up and provided patient with direct contact number Advised patient to f/u with PCP about transportation application part B BSW completed part A of SCAT application with pt over the phone. BSW will e-fax part B to PCP office.           Please call the Suicide and Crisis Lifeline: 988 go to [facility] Urgent [facility] [address] [place] [phone]) call 911 if you are experiencing a Mental Health or Behavioral Health Crisis or need someone to talk to.  Patient verbalized understanding of Care plan and visit instructions communicated this visit  [name], [place]/[facility] - [facility] Social Worker [phone]

## 2024-12-30 ENCOUNTER — Telehealth: Payer: Self-pay

## 2024-12-30 NOTE — Patient Instructions (Signed)
 [name] - I am sorry I was unable to reach you today for our scheduled appointment. I work with Bhagat, Virali, DO and am calling to support your healthcare needs. Please contact me at [phone] at your earliest convenience. I look forward to speaking with you soon.   Thank you,  Warren Quivers RN CM Population Health-Complex Care Management [facility] [phone]

## 2024-12-31 ENCOUNTER — Other Ambulatory Visit: Payer: Self-pay

## 2024-12-31 NOTE — Patient Instructions (Signed)
 Visit Information  Thank you for taking time to visit with me today. Please don't hesitate to contact me if I can be of assistance to you before our next scheduled appointment.  Our next appointment is by telephone on 01/13/25 at 1 pm. Please call the care guide team at [phone] if you need to cancel or reschedule your appointment.   Following is a copy of your care plan:   Goals Addressed             This Visit's Progress    VBCI RN Care Plan- Diabetes       Problems:  Chronic Disease Management support and education needs related to DMII Transportation barriers  Goal: Over the next 90 days the Patient will attend all scheduled medical appointments: pertaining to health maintenance as evidenced by patient report and/or chart review.        continue to work with Medical Illustrator and/or Social Worker to address care management and care coordination needs related to DMII as evidenced by adherence to care management team scheduled appointments     demonstrate Ongoing adherence to prescribed treatment plan for DMII as evidenced by blood sugars being maintained within a normal range.  demonstrate Ongoing health management independence as evidenced by patient report and/or chart review.         verbalize understanding of plan for management of DMII as evidenced by checking blood sugars 2 times daily, keeping a log and reporting any abnormal results to provider.   Interventions:   Diabetes Interventions: Assessed patient's understanding of A1c goal: <7% Provided education to patient about basic DM disease process Reviewed medications with patient and discussed importance of medication adherence Counseled on importance of regular laboratory monitoring as prescribed Discussed plans with patient for ongoing care management follow up and provided patient with direct contact information for care management team Provided patient with written educational materials related to hypo and  hyperglycemia and importance of correct treatment Screening for signs and symptoms of depression related to chronic disease state  Assessed social determinant of health barriers Lab Results  Component Value Date   HGBA1C 7.7 (A) 06/06/2024    Patient Self-Care Activities:  schedule appointment with eye doctor take the blood sugar log to all doctor visits drink 6 to 8 glasses of water each day eat fish at least once per week fill half of plate with vegetables limit fast food meals to no more than 1 per week manage portion size prepare main meal at home 3 to 5 days each week switch to low-fat or skim milk  Plan:  Telephone follow up appointment with care management team member scheduled for:  01/13/25 at 1 pm          VBCI RN Care Plan- HTN       Problems:  Chronic Disease Management support and education needs related to HTN Transportation barriers  Goal: Over the next 90 days the Patient will attend all scheduled medical appointments: pertaining to health maintenance as evidenced by patient report and/or chart review.        continue to work with Medical Illustrator and/or Social Worker to address care management and care coordination needs related to HTN as evidenced by adherence to care management team scheduled appointments     demonstrate Ongoing adherence to prescribed treatment plan for HTN as evidenced by management of blood pressure levels. take all medications exactly as prescribed and will call provider for medication related questions as evidenced by patient report and/or  chart review.     verbalize basic understanding of HTN disease process and self health management plan as evidenced by monitoring and management of blood pressure. work with child psychotherapist to address Transportation related to the management of attending all scheduled appointments as evidenced by review of electronic medical record and patient or social worker report      Interventions:   Hypertension  Interventions: Last practice recorded BP readings:  BP Readings from Last 3 Encounters:  12/17/24 121/89  12/17/24 (!) 73/53  09/06/24 136/89   Most recent eGFR/CrCl:  Lab Results  Component Value Date   EGFR 92 05/17/2024    No components found for: CRCL  Evaluation of current treatment plan related to hypertension self management and patient's adherence to plan as established by provider Provided education to patient re: stroke prevention, s/s of heart attack and stroke Reviewed medications with patient and discussed importance of compliance Discussed plans with patient for ongoing care management follow up and provided patient with direct contact information for care management team Advised patient, providing education and rationale, to monitor blood pressure daily and record, calling PCP for findings outside established parameters Discussed complications of poorly controlled blood pressure such as heart disease, stroke, circulatory complications, vision complications, kidney impairment, sexual dysfunction Screening for signs and symptoms of depression related to chronic disease state  Assessed social determinant of health barriers Advised of the importance of following a low sodium diet.   Patient Self-Care Activities:  Attend all scheduled provider appointments Call pharmacy for medication refills 3-7 days in advance of running out of medications Call provider office for new concerns or questions  Perform all self care activities independently  Perform IADL's (shopping, preparing meals, housekeeping, managing finances) independently Take medications as prescribed   Work with the social worker to address care coordination needs and will continue to work with the clinical team to address health care and disease management related needs check blood pressure daily write blood pressure results in a log or diary learn about high blood pressure keep a blood pressure log keep all  doctor appointments take medications for blood pressure exactly as prescribed report new symptoms to your doctor eat more whole grains, fruits and vegetables, lean meats and healthy fats limit salt intake to 1,500 mg/day  Plan:  Telephone follow up appointment with care management team member scheduled for:  01/13/25 at 1 pm          VBCI RN Care Plan- pain       Problems:  Chronic Disease Management support and education needs related to Chronic nerve pain  Goal: Over the next 90 days the Patient will attend all scheduled medical appointments: pertaining to health maintenance as evidenced by patient report and/or chart review.         continue to work with Medical Illustrator and/or Social Worker to address care management and care coordination needs related to chronic nerve pain as evidenced by adherence to care management team scheduled appointments     demonstrate Improved health management independence as evidenced by taking medications as prescribed, monitoring for any changes in pain control and reporting changes to PCP.        verbalize basic understanding of chronic nerve pain disease process and self health management plan as evidenced by patient report of better controlled pain levels. verbalize understanding of plan for management of chronic nerve pain as evidenced by patient reporting better pain control.  Interventions:   Pain Interventions: Pain assessment performed Medications reviewed  Reviewed provider established plan for pain management Discussed importance of adherence to all scheduled medical appointments Counseled on the importance of reporting any/all new or changed pain symptoms or management strategies to pain management provider Advised patient to report to care team affect of pain on daily activities Discussed use of relaxation techniques and/or diversional activities to assist with pain reduction (distraction, imagery, relaxation, massage, acupressure, TENS,  heat, and cold application Reviewed with patient prescribed pharmacological and nonpharmacological pain relief strategies Screening for signs and symptoms of depression related to chronic disease state  Assessed social determinant of health barriers  Patient Self-Care Activities:  Attend all scheduled provider appointments Call pharmacy for medication refills 3-7 days in advance of running out of medications Call provider office for new concerns or questions  Perform all self care activities independently  Perform IADL's (shopping, preparing meals, housekeeping, managing finances) independently Take medications as prescribed    Plan:  Telephone follow up appointment with care management team member scheduled for:  01/13/25 at 1 pm             Please call the Suicide and Crisis Lifeline: 988 call the [place]  National Suicide Prevention Lifeline: 770-493-9351 or TTY: 251 320 8116 TTY 513 692 0485) to talk to a trained counselor call 1-800-273-TALK (toll free, 24 hour hotline) if you are experiencing a Mental Health or Behavioral Health Crisis or need someone to talk to.  Patient verbalized understanding of Care plan and visit instructions communicated this visit Warren Quivers RN CM Population Health-Complex Care Management [facility] [phone]

## 2024-12-31 NOTE — Patient Outreach (Signed)
 Complex Care Management   Visit Note  12/31/2024  Name:  [name] MRN: [id number] DOB: [birth year]  Situation: Referral received for Complex Care Management related to HTN, DMII and chronic pain. I obtained verbal consent from Patient.  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 Unice neurosurgery   Chronic lower back pain    Claudication of both lower extremities 07/15/2015   Clotting disorder    COPD (chronic obstructive pulmonary disease) (HCC)    Critical limb ischemia of right lower extremity (HCC) 08/09/2023   Critical lower limb ischemia (HCC) 12/19/2019   Diabetes mellitus    takes Metformin  daily   Gangrene of right foot (HCC) 10/07/2023   GERD (gastroesophageal reflux disease)    takes Protonix  daily   Glaucoma    History of blood transfusion    related to low HgB ((09/10/2015   Hyperlipidemia    takes Vytorin  daily   Hypertension    takes Benazepril  and Bystolic  daily   Imaging of gastrointestinal tract abnormal 03/30/2021   PAD (peripheral artery disease)    Pneumonia    Radiculopathy, cervical region 02/11/2017   Shortness of breath dyspnea    with exertion   Tobacco user    Toe fracture, right 05/09/2011   Wears glasses     Assessment: Patient Reported Symptoms:  Cognitive Cognitive Status: No symptoms reported   Health Maintenance Behaviors: Annual physical exam, Healthy diet, Sleep adequate Healing Pattern: Unsure Health Facilitated by: Healthy diet, Rest, Stress management  Neurological Neurological Review of Symptoms: No symptoms reported Neurological Management Strategies: Adequate rest, Medication therapy Neurological Self-Management Outcome: 3 (uncertain)  HEENT HEENT  Symptoms Reported: No symptoms reported HEENT Management Strategies: Routine screening HEENT Self-Management Outcome: 4 (good)    Cardiovascular Cardiovascular Symptoms Reported: No symptoms reported Does patient have uncontrolled Hypertension?: No Cardiovascular Management Strategies: Routine screening Weight: 149 lb (67.6 kg) Cardiovascular Self-Management Outcome: 3 (uncertain)  Respiratory Respiratory Symptoms Reported: No symptoms reported Respiratory Management Strategies: Routine screening, Medication therapy Respiratory Self-Management Outcome: 4 (good)  Endocrine Endocrine Symptoms Reported: No symptoms reported Is patient diabetic?: Yes Is patient checking blood sugars at home?: Yes List most recent blood sugar readings, include date and time of day: Checking blood sugar levels 2 times a day. Endocrine Self-Management Outcome: 4 (good)  Gastrointestinal Gastrointestinal Symptoms Reported: No symptoms reported Additional Gastrointestinal Details: Was seen in ED on 12/17/24 for abdominal pain- states it was his gallbladder and patient is being referred to a general surgeon. Has upcoming appointment with the surgeon on 01/03/25 Gastrointestinal Management Strategies: Diet modification, Adequate rest Gastrointestinal Self-Management Outcome: 3 (uncertain)    Genitourinary Genitourinary Symptoms Reported: No symptoms reported Genitourinary Self-Management Outcome: 4 (good)  Integumentary Integumentary Symptoms Reported: No symptoms reported Additional Integumentary Details: Bilateral amputee sites all healed. Skin Self-Management Outcome: 4 (good)  Musculoskeletal Musculoskelatal Symptoms Reviewed: Muscle pain Additional Musculoskeletal Details: Reports muscle pain in shoulders due to transferring with a transfer bench. Musculoskeletal Management Strategies: Adequate rest, Routine screening, Medication therapy Musculoskeletal Self-Management Outcome: 3 (uncertain) Falls in the past  year?: No Number of falls in past year: 1 or less Was there an injury with Fall?: No Fall Risk Category Calculator: 0 Patient  Fall Risk Level: Low Fall Risk Patient at Risk for Falls Due to: Other (Comment) (Bilateral amputee- uses transfer bench and wheelchair)  Psychosocial Psychosocial Symptoms Reported: No symptoms reported Behavioral Management Strategies: Medication therapy, Adequate rest Behavioral Health Self-Management Outcome: 4 (good)   Quality of Family Relationships: supportive, helpful Do you feel physically threatened by others?: No    12/31/2024    PHQ2-9 Depression Screening   Little interest or pleasure in doing things Not at all  Feeling down, depressed, or hopeless Not at all  PHQ-2 - Total Score 0  Trouble falling or staying asleep, or sleeping too much    Feeling tired or having little energy    Poor appetite or overeating     Feeling bad about yourself - or that you are a failure or have let yourself or your family down    Trouble concentrating on things, such as reading the newspaper or watching television    Moving or speaking so slowly that other people could have noticed.  Or the opposite - being so fidgety or restless that you have been moving around a lot more than usual    Thoughts that you would be better off dead, or hurting yourself in some way    PHQ2-9 Total Score    If you checked off any problems, how difficult have these problems made it for you to do your work, take care of things at home, or get along with other people    Depression Interventions/Treatment      Today's Vitals   12/31/24 1122  Weight: 149 lb (67.6 kg)   Pain Scale: 0-10 Pain Score: 7  Pain Type: Phantom pain Pain Location: Leg (Bilateral amputee- phantom pain) Pain Orientation: Right, Left Pain Descriptors / Indicators: Throbbing, Burning Pain Onset: On-going Patients Stated Pain Goal: 3 Pain Intervention(s): Medication (See eMAR) Multiple Pain Sites: No  Medications  Reviewed Today     Reviewed by Leodis Warren DEL, RN (Registered Nurse) on 12/31/24 at 1114  Med List Status: <None>   Medication Order Taking? Sig Documenting Provider Last Dose Status Informant  acetaminophen  (TYLENOL ) 500 MG tablet [id number] Yes Take 500 mg by mouth every 6 (six) hours as needed for mild pain (pain score 1-3) or moderate pain (pain score 4-6). [provider]  Active Self, Spouse/Significant Other, Pharmacy Records  albuterol  (VENTOLIN  HFA) 108 (90 Base) MCG/ACT inhaler [id number] Yes TAKE 2 PUFFS BY MOUTH EVERY 6 HOURS AS NEEDED FOR WHEEZE OR SHORTNESS OF BREATH Bhagat, Virali, DO  Active Self, Spouse/Significant Other, Pharmacy Records  amLODipine  (NORVASC ) 5 MG tablet [id number]  Take 1 tablet (5 mg total) by mouth daily.  Patient not taking: Reported on 12/31/2024   Jerrie Gathers, DO  Active Self, Spouse/Significant Other, Pharmacy Records  aspirin  EC 81 MG tablet [id number] Yes Take 81 mg by mouth daily. [provider]  Active Self, Spouse/Significant Other, Pharmacy Records  atorvastatin  (LIPITOR) 40 MG tablet [id number] Yes TAKE 1 TABLET BY MOUTH EVERY DAY Bhagat, Virali, DO  Active Self, Spouse/Significant Other, Pharmacy Records  baclofen  (LIORESAL ) 10 MG tablet [id number] Yes Take 5 mg by mouth 2 (two) times daily as needed for muscle spasms. [provider]  Active Self, Spouse/Significant Other, Pharmacy Records  benazepril  (LOTENSIN ) 5 MG tablet [id number] Yes Take 1 tablet (5 mg total) by mouth daily. Jerrie Gathers, DO  Active Self, Spouse/Significant Other, Pharmacy Records  Doxylamine Succinate, Sleep, (SLEEP AID PO) [id number] Yes Take 1 tablet by mouth at bedtime as needed (sleep).  [provider]  Active Self, Spouse/Significant Other, Pharmacy Records  DULoxetine  (CYMBALTA ) 60 MG capsule [id number] Yes Take 1 capsule (60 mg total) by mouth daily. Jerrie Gathers, DO  Active Self, Spouse/Significant Other, Pharmacy Records  empagliflozin   (JARDIANCE ) 10 MG TABS tablet [id number] Yes TAKE 1 TABLET BY MOUTH EVERY DAY Bhagat, Virali, DO  Active Self, Spouse/Significant Other, Pharmacy Records  ezetimibe  (ZETIA ) 10 MG tablet [id number] Yes TAKE 1 TABLET BY MOUTH EVERY DAY Joshua Domino, DO  Active Self, Spouse/Significant Other, Pharmacy Records  feeding supplement (ENSURE ENLIVE / ENSURE PLUS) LIQD [id number] Yes Take 237 mLs by mouth 2 (two) times daily between meals. Elicia Hamlet, MD  Active Self, Spouse/Significant Other, Pharmacy Records  gabapentin  (NEURONTIN ) 100 MG capsule [id number] Yes Take 1 capsule (100 mg total) by mouth at bedtime. Bhagat, Virali, DO  Active Self, Spouse/Significant Other, Pharmacy Records  gabapentin  (NEURONTIN ) 300 MG capsule [id number] Yes Take 1 capsule (300 mg total) by mouth 3 (three) times daily. Jerrie Gathers, DO  Active Self, Spouse/Significant Other, Pharmacy Records  metFORMIN  (GLUCOPHAGE ) 1000 MG tablet [id number] Yes Take 1 tablet (1,000 mg total) by mouth 2 (two) times daily with a meal. Bhagat, Virali, DO  Active Self, Spouse/Significant Other, Pharmacy Records           Med Note (CRUTHIS, CHLOE C   Tue Dec 17, 2024  2:25 PM) No fill hx found. Wife is adamant the pt is taking this.   pantoprazole  (PROTONIX ) 40 MG tablet [id number] Yes Take 1 tablet (40 mg total) by mouth daily. Jerrie Gathers, DO  Active Self, Spouse/Significant Other, Pharmacy Records  polyethylene glycol (MIRALAX  / GLYCOLAX ) 17 g packet [id number] Yes Take 17 g by mouth daily as needed. Joshua Domino, DO  Active Self, Spouse/Significant Other, Pharmacy Records  sucralfate  (CARAFATE ) 1 g tablet [id number]  Take 1 tablet (1 g total) by mouth 4 (four) times daily -  with meals and at bedtime.  Patient not taking: Reported on 12/31/2024   Joshua Domino, DO  Active Self, Spouse/Significant Other, Pharmacy Records            Recommendation:   Continue Current Plan of Care Follow up with General surgeon for abdominal pain- scheduled for  01/03/25  Follow Up Plan:   Telephone follow up appointment date/time:  01/13/25 at 1 pm.  Warren Quivers RN CM Population Health-Complex Care Management [facility] [phone]

## 2025-01-06 ENCOUNTER — Other Ambulatory Visit: Payer: Self-pay

## 2025-01-06 ENCOUNTER — Telehealth: Payer: Self-pay

## 2025-01-06 NOTE — Patient Outreach (Signed)
 Social Drivers of Health  Community Resource and Care Coordination Visit Note   01/06/2025  Name: [name] MRN: [id number] DOB:1951/06/23  Situation: Referral received for [facility] needs assessment and assistance related to Transportation. I obtained verbal consent from Patient.  Visit completed with Patient on the phone.   Background:   SDOH Interventions Today    Flowsheet Row Most Recent Value  SDOH Interventions   Transportation Interventions Other (Comment)  [BSW submitted transportation eligibility form to DSS for patient. Pt will f/u with PCP regarding SCAT application part B.]     Assessment:   Goals Addressed             This Visit's Progress    BSW Goal       Current SDOH Barriers:  Transportation  Interventions: Patient interviewed and appropriate screenings performed Referred patient to community resources  Provided patient with information about [employer] [place] Discussed plans with patient for ongoing follow up and provided patient with direct contact number Advised patient to f/u with PCP about transportation application part B BSW completed part A of SCAT application with pt over the phone. BSW will e-fax part B to PCP office.  01/06/2025  BSW submitted DSS Transportation Eligibility Form to DSS and provide patient with phone number to follow up [phone]).  Pt will f/u with PCP re SCAT app. Part B.         Recommendation:   attend all scheduled provider appointments call for transportation assistance at least one week before appointments ask for help if you don't understand your health insurance benefits  Follow Up Plan:   Telephone follow up appointment date/time:  01/13/2025 at 9:30Am.   Laymon Doll, BSW [place]/VBCI - [employer] Social Worker [phone]

## 2025-01-06 NOTE — Patient Instructions (Signed)
 Visit Information  Thank you for taking time to visit with me today. Please don't hesitate to contact me if I can be of assistance to you before our next scheduled appointment.  Your next care management appointment is by telephone on 01/13/2025 at 9:30AM.  Telephone follow up appointment date/time:  01/13/2025 at 9:30AM.   Please call the care guide team at [phone] if you need to cancel, schedule, or reschedule an appointment.   Please call the Suicide and Crisis Lifeline: 988 go to [facility] [address] [place] [phone]) call 911 if you are experiencing a Mental Health or Behavioral Health Crisis or need someone to talk to.  Laymon Doll, BSW [place]/VBCI - [employer] Social Worker [phone]

## 2025-01-09 NOTE — Patient Outreach (Signed)
 BSW received incoming call from [place] at DSS regarding medicaid transportation for patient. Heron will work with patient to get him scheduled for assessment with a DSS child psychotherapist for [employer] transportation.

## 2025-01-09 NOTE — Telephone Encounter (Signed)
 3rd attempt to contact patient regarding completion of SCAT form Part B. Left HIPAA compliant voicemail.

## 2025-01-10 NOTE — Telephone Encounter (Signed)
 Patient returned call to nurse line regarding transportation forms.   Contact number for patient is [phone].  Brian JAYSON English, RN

## 2025-01-13 ENCOUNTER — Other Ambulatory Visit: Payer: Self-pay

## 2025-01-13 ENCOUNTER — Other Ambulatory Visit: Payer: Self-pay | Admitting: *Deleted

## 2025-01-13 NOTE — Patient Instructions (Signed)
 Visit Information  Thank you for taking time to visit with me today. Please don't hesitate to contact me if I can be of assistance to you before our next scheduled appointment.  Our next appointment is by telephone on 02/28/25 at 9:30 am.  Please call the care guide team at [phone] if you need to cancel or reschedule your appointment.   Following is a copy of your care plan:   Goals Addressed             This Visit's Progress    VBCI RN Care Plan- Diabetes       Problems:  Chronic Disease Management support and education needs related to DMII Transportation barriers  Goal: Over the next 90 days the Patient will attend all scheduled medical appointments: pertaining to health maintenance as evidenced by patient report and/or chart review.        continue to work with Medical Illustrator and/or Social Worker to address care management and care coordination needs related to DMII as evidenced by adherence to care management team scheduled appointments     demonstrate Ongoing adherence to prescribed treatment plan for DMII as evidenced by blood sugars being maintained within a normal range.  demonstrate Ongoing health management independence as evidenced by patient report and/or chart review.         verbalize understanding of plan for management of DMII as evidenced by checking blood sugars 2 times daily, keeping a log and reporting any abnormal results to provider.   Interventions:   Diabetes Interventions: Assessed patient's understanding of A1c goal: <7% Provided education to patient about basic DM disease process Reviewed medications with patient and discussed importance of medication adherence Counseled on importance of regular laboratory monitoring as prescribed Discussed plans with patient for ongoing care management follow up and provided patient with direct contact information for care management team Provided patient with written educational materials related to hypo and  hyperglycemia and importance of correct treatment Screening for signs and symptoms of depression related to chronic disease state  Assessed social determinant of health barriers Lab Results  Component Value Date   HGBA1C 7.7 (A) 06/06/2024    Patient Self-Care Activities:  Attend all scheduled provider appointments Attend church or other social activities Call pharmacy for medication refills 3-7 days in advance of running out of medications Take medications as prescribed   Work with the social worker to address care coordination needs and will continue to work with the clinical team to address health care and disease management related needs schedule appointment with eye doctor check blood sugar at prescribed times: before meals and at bedtime and when you have symptoms of low or high blood sugar take the blood sugar log to all doctor visits drink 6 to 8 glasses of water each day eat fish at least once per week fill half of plate with vegetables limit fast food meals to no more than 1 per week manage portion size prepare main meal at home 3 to 5 days each week switch to low-fat or skim milk  Plan:  Telephone follow up appointment with care management team member scheduled for:  02/28/25@ 9:30 am.            VBCI RN Care Plan- HTN       Problems:  Chronic Disease Management support and education needs related to HTN Transportation barriers  Goal: Over the next 90 days the Patient will attend all scheduled medical appointments: pertaining to health maintenance as evidenced by patient  report and/or chart review.        continue to work with Medical Illustrator and/or Social Worker to address care management and care coordination needs related to HTN as evidenced by adherence to care management team scheduled appointments     demonstrate Ongoing adherence to prescribed treatment plan for HTN as evidenced by management of blood pressure levels. take all medications exactly as prescribed  and will call provider for medication related questions as evidenced by patient report and/or chart review.     verbalize basic understanding of HTN disease process and self health management plan as evidenced by monitoring and management of blood pressure. work with child psychotherapist to address Transportation related to the management of attending all scheduled appointments as evidenced by review of electronic medical record and patient or social worker report      Interventions:   Hypertension Interventions: Last practice recorded BP readings:  BP Readings from Last 3 Encounters:  01/13/25 125/78  12/17/24 121/89  12/17/24 (!) 73/53   Most recent eGFR/CrCl:  Lab Results  Component Value Date   EGFR 92 05/17/2024    No components found for: CRCL  Evaluation of current treatment plan related to hypertension self management and patient's adherence to plan as established by provider Provided education to patient re: stroke prevention, s/s of heart attack and stroke Reviewed medications with patient and discussed importance of compliance Discussed plans with patient for ongoing care management follow up and provided patient with direct contact information for care management team Advised patient, providing education and rationale, to monitor blood pressure daily and record, calling PCP for findings outside established parameters Discussed complications of poorly controlled blood pressure such as heart disease, stroke, circulatory complications, vision complications, kidney impairment, sexual dysfunction Screening for signs and symptoms of depression related to chronic disease state  Assessed social determinant of health barriers Advised of the importance of following a low sodium diet.   Patient Self-Care Activities:  Attend all scheduled provider appointments Attend church or other social activities Call pharmacy for medication refills 3-7 days in advance of running out of  medications Call provider office for new concerns or questions  Perform all self care activities independently  Perform IADL's (shopping, preparing meals, housekeeping, managing finances) independently Take medications as prescribed   Work with the social worker to address care coordination needs and will continue to work with the clinical team to address health care and disease management related needs check blood pressure daily write blood pressure results in a log or diary learn about high blood pressure keep a blood pressure log take blood pressure log to all doctor appointments call doctor for signs and symptoms of high blood pressure keep all doctor appointments take medications for blood pressure exactly as prescribed report new symptoms to your doctor eat more whole grains, fruits and vegetables, lean meats and healthy fats limit salt intake to 1,500 mg/day  Plan:  Telephone follow up appointment with care management team member scheduled for:  02/28/25 @ 9:30 am.              Please call the Suicide and Crisis Lifeline: 988 call the [place]  National Suicide Prevention Lifeline: (607) 285-2461 or TTY: 973-297-1719 TTY 814-739-8955) to talk to a trained counselor call 1-800-273-TALK (toll free, 24 hour hotline) go to [facility] [address] [place] [phone]) call the [facility] [place] Crisis Line: [phone] call 911 if you are experiencing a Mental Health or Behavioral Health Crisis or need someone  to talk to.  Patient verbalized understanding of Care plan and visit instructions communicated this visit  Algis Lehenbauer, RN, BSN, ACM RN Care Manager [facility] [phone]    Visit Information  Thank you for taking time to visit with me today. Please don't hesitate to contact me if I can be of assistance to you before our next scheduled appointment.  Your next care management appointment is by telephone on  02/28/25 at 9:30 am.  Telephone follow-up in 1 month: 02/28/25 @ 9:30 am.  Please call the care guide team at [phone] if you need to cancel, schedule, or reschedule an appointment.   Please call the Suicide and Crisis Lifeline: 988 call the [place]  National Suicide Prevention Lifeline: 531-278-9437 or TTY: 657-419-3241 TTY (657) 712-5668) to talk to a trained counselor call 1-800-273-TALK (toll free, 24 hour hotline) go to [facility] [address], [place] [phone]) call the [facility] Crisis Line: [phone] call 911 if you are experiencing a Mental Health or Behavioral Health Crisis or need someone to talk to.  Oswald Pott, RN, BSN, Theatre Manager [employer] [phone]

## 2025-01-13 NOTE — Patient Instructions (Signed)
 Visit Information  Thank you for taking time to visit with me today. Please don't hesitate to contact me if I can be of assistance to you before our next scheduled appointment.  Your next care management appointment is by telephone on 01/20/2025 at 10AM.  Telephone follow up appointment date/time:  01/20/2025 at 10AM.   Please call the care guide team at [phone] if you need to cancel, schedule, or reschedule an appointment.   Please call the Suicide and Crisis Lifeline: 988 go to [facility] Urgent [facility] [address] [place] [phone]) call 911 if you are experiencing a Mental Health or Behavioral Health Crisis or need someone to talk to.  Laymon Doll, BSW [place]/VBCI - [employer] Social Worker [phone]

## 2025-01-13 NOTE — Patient Outreach (Signed)
 Social Drivers of Health  Community Resource and Care Coordination Visit Note   01/13/2025  Name: KRISS ISHLER MRN: [id number] DOB:1951/10/28  Situation: Referral received for [facility] needs assessment and assistance related to Transportation. I obtained verbal consent from Patient.  Visit completed with Patient on the phone.   Background:   SDOH Interventions Today    Flowsheet Row Most Recent Value  SDOH Interventions   Food Insecurity Interventions Intervention Not Indicated  Housing Interventions Intervention Not Indicated  Transportation Interventions Other (Comment)  [Patient recieved call from DSS re transportation assessment. Waiting to complete assessment with case worker. BSW faxed SCAT application and waiting for provider to complete Part B.]  Utilities Interventions Intervention Not Indicated     Assessment:   Goals Addressed             This Visit's Progress    BSW Goal   On track    Current SDOH Barriers:  Transportation  Interventions: Patient interviewed and appropriate screenings performed Referred patient to community resources  Provided patient with information about [place] Discussed plans with patient for ongoing follow up and provided patient with direct contact number Advised patient to f/u with PCP about transportation application part B BSW completed part A of SCAT application with pt over the phone. BSW will e-fax part B to PCP office.  01/06/2025  BSW submitted DSS Transportation Eligibility Form to DSS and provide patient with phone number to follow up [phone]).  Pt will f/u with PCP re SCAT app. Part B.         Recommendation:   attend all scheduled provider appointments ask for help if you don't understand your health insurance benefits Complete transportation assessment with DSS caseworker.   Follow Up Plan:   Telephone follow up appointment date/time:  01/20/2025 at 10AM.   Laymon Doll, BSW [place]/VBCI -  [facility] Social Worker [phone]

## 2025-01-13 NOTE — Patient Outreach (Signed)
 Complex Care Management   Visit Note  01/13/2025  Name:  [name] MRN: [id number] DOB: January 25, 1951  Situation: Referral received for Complex Care Management related to SDOH Barriers:  Transportation and Diabetes and HTN I obtained verbal consent from Patient.  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 Unice neurosurgery   Chronic lower back pain    Claudication of both lower extremities 07/15/2015   Clotting disorder    COPD (chronic obstructive pulmonary disease) (HCC)    Critical limb ischemia of right lower extremity (HCC) 08/09/2023   Critical lower limb ischemia (HCC) 12/19/2019   Diabetes mellitus    takes Metformin  daily   Gangrene of right foot (HCC) 10/07/2023   GERD (gastroesophageal reflux disease)    takes Protonix  daily   Glaucoma    History of blood transfusion    related to low HgB ((09/10/2015   Hyperlipidemia    takes Vytorin  daily   Hypertension    takes Benazepril  and Bystolic  daily   Imaging of gastrointestinal tract abnormal 03/30/2021   PAD (peripheral artery disease)    Pneumonia    Radiculopathy, cervical region 02/11/2017   Shortness of breath dyspnea    with exertion   Tobacco user    Toe fracture, right 05/09/2011   Wears glasses     Assessment: Patient Reported Symptoms:  Cognitive Cognitive Status: No symptoms reported, Alert and oriented to person, place, and time, Insightful and able to interpret abstract concepts, Normal speech and language skills Cognitive/Intellectual Conditions Management [RPT]: None reported or documented in medical history or problem list   Health Maintenance Behaviors: Annual physical exam, Healthy diet Healing Pattern: Average Health  Facilitated by: Healthy diet, Rest, Stress management, Pain control  Neurological Neurological Review of Symptoms: No symptoms reported Neurological Management Strategies: Adequate rest, Medication therapy Neurological Self-Management Outcome: 4 (good)  HEENT HEENT Symptoms Reported: No symptoms reported HEENT Management Strategies: Routine screening, Adequate rest HEENT Self-Management Outcome: 4 (good)    Cardiovascular Cardiovascular Symptoms Reported: No symptoms reported Does patient have uncontrolled Hypertension?: No Cardiovascular Management Strategies: Adequate rest, Routine screening Cardiovascular Self-Management Outcome: 4 (good) Cardiovascular Comment: Patient reports that blood sugar was 125/78 today.  Respiratory Respiratory Symptoms Reported: No symptoms reported Respiratory Management Strategies: Adequate rest, Medication therapy Respiratory Self-Management Outcome: 4 (good)  Endocrine Endocrine Symptoms Reported: No symptoms reported Is patient diabetic?: Yes Is patient checking blood sugars at home?: Yes List most recent blood sugar readings, include date and time of day: Checking blood sugar levels 2 times daily.  Patient reports that blood sugar today was 117. Endocrine Self-Management Outcome: 4 (good)  Gastrointestinal Gastrointestinal Symptoms Reported: No symptoms reported Gastrointestinal Management Strategies: Diet modification, Adequate rest Gastrointestinal Self-Management Outcome: 3 (uncertain)    Genitourinary Genitourinary Symptoms Reported: No symptoms reported Genitourinary Management Strategies: Adequate rest Genitourinary Self-Management Outcome: 4 (good)  Integumentary Integumentary Symptoms Reported: No symptoms reported Skin Management Strategies: Coping strategies, Adequate rest, Routine screening Skin Self-Management Outcome: 4 (good)  Musculoskeletal Musculoskelatal Symptoms Reviewed: Other Other Musculoskeletal Symptoms: Patient reports that  he is having phantom pain in both stumps. Patient a bilateral amputee.  Patient is currently taking Neurotin to help with pain but has been in  discussion with provider to increase the dose.  He also takes Tylenol  as needed. Musculoskeletal Management Strategies: Adequate rest, Coping strategies, Routine screening, Medication therapy, Medical device Musculoskeletal Self-Management Outcome: 3 (uncertain) Falls in the past year?: No Patient at Risk for Falls Due to: No Fall Risks  Psychosocial Psychosocial Symptoms Reported: No symptoms reported Behavioral Management Strategies: Support system Behavioral Health Self-Management Outcome: 4 (good) Major Change/Loss/Stressor/Fears (CP): Denies Quality of Family Relationships: helpful, involved, supportive Do you feel physically threatened by others?: No    01/13/2025    PHQ2-9 Depression Screening   Little interest or pleasure in doing things    Feeling down, depressed, or hopeless    PHQ-2 - Total Score    Trouble falling or staying asleep, or sleeping too much    Feeling tired or having little energy    Poor appetite or overeating     Feeling bad about yourself - or that you are a failure or have let yourself or your family down    Trouble concentrating on things, such as reading the newspaper or watching television    Moving or speaking so slowly that other people could have noticed.  Or the opposite - being so fidgety or restless that you have been moving around a lot more than usual    Thoughts that you would be better off dead, or hurting yourself in some way    PHQ2-9 Total Score    If you checked off any problems, how difficult have these problems made it for you to do your work, take care of things at home, or get along with other people    Depression Interventions/Treatment      Today's Vitals   01/13/25 1333  BP: 125/78   Pain Score: 7  Pain Type: Phantom pain Pain Location:  (Bilaterall amputee and reports phantom pain) Pain  Orientation: Right, Left Pain Descriptors / Indicators: Burning, Throbbing Pain Onset: On-going Patients Stated Pain Goal: 3 Pain Intervention(s): Medication (See eMAR), Relaxation  Medications Reviewed Today     Reviewed by Jorja Nichole LABOR, RN (Case Manager) on 01/13/25 at 1532  Med List Status: <None>   Medication Order Taking? Sig Documenting Provider Last Dose Status Informant  acetaminophen  (TYLENOL ) 500 MG tablet [id number] Yes Take 500 mg by mouth every 6 (six) hours as needed for mild pain (pain score 1-3) or moderate pain (pain score 4-6). [provider]  Active Self, Spouse/Significant Other, Pharmacy Records  albuterol  (VENTOLIN  HFA) 108 (90 Base) MCG/ACT inhaler [id number] Yes TAKE 2 PUFFS BY MOUTH EVERY 6 HOURS AS NEEDED FOR WHEEZE OR SHORTNESS OF BREATH Bhagat, Virali, DO  Active Self, Spouse/Significant Other, Pharmacy Records  amLODipine  (NORVASC ) 5 MG tablet [id number] Yes Take 1 tablet (5 mg total) by mouth daily. Jerrie Gathers, DO  Active Self, Spouse/Significant Other, Pharmacy Records  aspirin  EC 81 MG tablet [id number] Yes Take 81 mg by mouth daily. [provider]  Active Self, Spouse/Significant Other, Pharmacy Records  atorvastatin  (LIPITOR) 40 MG tablet [id number] Yes TAKE 1 TABLET BY MOUTH EVERY DAY Bhagat, Virali, DO  Active Self, Spouse/Significant Other, Pharmacy Records  baclofen  (LIORESAL ) 10 MG tablet [id number] Yes Take 5 mg by mouth 2 (two) times daily as needed for muscle spasms. [provider]  Active Self, Spouse/Significant Other, Pharmacy Records  benazepril  (LOTENSIN ) 5 MG tablet [id number] Yes Take 1 tablet (5 mg total) by mouth daily. Jerrie Gathers, DO  Active Self, Spouse/Significant Other, Pharmacy Records  Doxylamine Succinate, Sleep, (SLEEP AID PO) [id number] Yes  Take 1 tablet by mouth at bedtime as needed (sleep). [provider]  Active Self, Spouse/Significant Other, Pharmacy Records  DULoxetine  (CYMBALTA ) 60 MG  capsule [id number] Yes Take 1 capsule (60 mg total) by mouth daily. Bhagat, Virali, DO  Active Self, Spouse/Significant Other, Pharmacy Records  empagliflozin  (JARDIANCE ) 10 MG TABS tablet [id number] Yes TAKE 1 TABLET BY MOUTH EVERY DAY Bhagat, Virali, DO  Active Self, Spouse/Significant Other, Pharmacy Records  ezetimibe  (ZETIA ) 10 MG tablet [id number] Yes TAKE 1 TABLET BY MOUTH EVERY DAY Joshua Domino, DO  Active Self, Spouse/Significant Other, Pharmacy Records  feeding supplement (ENSURE ENLIVE / ENSURE PLUS) LIQD [id number] Yes Take 237 mLs by mouth 2 (two) times daily between meals. Elicia Hamlet, MD  Active Self, Spouse/Significant Other, Pharmacy Records  gabapentin  (NEURONTIN ) 100 MG capsule [id number] Yes Take 1 capsule (100 mg total) by mouth at bedtime. Jerrie Gathers, DO  Active Self, Spouse/Significant Other, Pharmacy Records  gabapentin  (NEURONTIN ) 300 MG capsule [id number] Yes Take 1 capsule (300 mg total) by mouth 3 (three) times daily. Jerrie Gathers, DO  Active Self, Spouse/Significant Other, Pharmacy Records  metFORMIN  (GLUCOPHAGE ) 1000 MG tablet [id number] Yes Take 1 tablet (1,000 mg total) by mouth 2 (two) times daily with a meal. Bhagat, Virali, DO  Active Self, Spouse/Significant Other, Pharmacy Records           Med Note (CRUTHIS, CHLOE C   Tue Dec 17, 2024  2:25 PM) No fill hx found. Wife is adamant the pt is taking this.   pantoprazole  (PROTONIX ) 40 MG tablet [id number] Yes Take 1 tablet (40 mg total) by mouth daily. Jerrie Gathers, DO  Active Self, Spouse/Significant Other, Pharmacy Records  polyethylene glycol (MIRALAX  / GLYCOLAX ) 17 g packet [id number] Yes Take 17 g by mouth daily as needed. Joshua Domino, DO  Active Self, Spouse/Significant Other, Pharmacy Records  sucralfate  (CARAFATE ) 1 g tablet [id number] Yes Take 1 tablet (1 g total) by mouth 4 (four) times daily -  with meals and at bedtime. Joshua Domino, DO  Active Self, Spouse/Significant Other, Pharmacy Records             Recommendation:   PCP Follow-up Specialty provider follow-up : General Surgery-01/15/25 Continue Current Plan of Care  Follow Up Plan:   Telephone follow-up in 1 month: 02/28/25 @ 9:30 am  Ciearra Rufo, RN, BSN, ACM RN Care Manager [employer] [phone]

## 2025-01-17 ENCOUNTER — Other Ambulatory Visit

## 2025-01-17 NOTE — Patient Outreach (Signed)
 Social Drivers of Health  Community Resource and Care Coordination Visit Note   01/17/2025  Name: [name] MRN: [id number] DOB:[birth year]  Situation: Referral received for [facility] needs assessment and assistance related to Transportation. I obtained verbal consent from Patient.  Visit completed with Patient on the phone.   Background:   SDOH Interventions Today    Flowsheet Row Most Recent Value  SDOH Interventions   Transportation Interventions Other (Comment)  [BSW sent PCP in-basket message following up on SCAT forms part B. Pt will be reaching out to DSS re their transportation program. [phone]     Assessment:   Goals Addressed             This Visit's Progress    BSW Goal   On track    Current SDOH Barriers:  Transportation  Interventions: Patient interviewed and appropriate screenings performed Referred patient to community resources  Provided patient with information about [place] Discussed plans with patient for ongoing follow up and provided patient with direct contact number Advised patient to f/u with PCP about transportation application part B BSW completed part A of SCAT application with pt over the phone. BSW will e-fax part B to PCP office.  01/06/2025  BSW submitted DSS Transportation Eligibility Form to DSS and provide patient with phone number to follow up [phone]).  Pt will f/u with PCP re SCAT app. Part B.         Recommendation:   attend all scheduled provider appointments ask for help if you don't understand your health insurance benefits F/u with DSS re transportation assessment.   Follow Up Plan:   Telephone follow up appointment date/time:  01/31/2025 at 10AM.  Laymon Doll, BSW [place] [facility] [place]/VBCI - [facility] Social Worker [phone]

## 2025-01-17 NOTE — Patient Instructions (Signed)
 Visit Information  Thank you for taking time to visit with me today. Please don't hesitate to contact me if I can be of assistance to you before our next scheduled appointment.  Your next care management appointment is by telephone on 01/31/2025 at 10AM  Telephone follow up appointment date/time:  01/31/2025 at 10AM.   Please call the care guide team at [phone] if you need to cancel, schedule, or reschedule an appointment.   Please call the Suicide and Crisis Lifeline: 988 go to [facility] Urgent [facility] [address] [place] [phone]) call 911 if you are experiencing a Mental Health or Behavioral Health Crisis or need someone to talk to.  Laymon Doll, BSW [place]/VBCI - [employer] Social Worker [phone]

## 2025-01-20 ENCOUNTER — Telehealth

## 2025-01-29 ENCOUNTER — Telehealth: Payer: Self-pay

## 2025-01-29 MED ORDER — GABAPENTIN 400 MG PO CAPS: 400.0000 mg | ORAL_CAPSULE | Freq: Three times a day (TID) | ORAL | 3 refills | Status: AC

## 2025-01-29 NOTE — Telephone Encounter (Signed)
 Called patient to discuss SCAT form completion and phantom leg pain. Confirmed name and DOB.  Received message from [facility] Manager that patient continues to report increased phantom leg pain. Patient reports his current dose of Gabapentin  is not sufficient and would like to try increasing which I think is appropriate  - Form completed and will be faxed - Gabapentin  increased to 400mg  TID  Kern Gingras, DO [facility], PGY-1 01/29/2025 2:57 PM

## 2025-01-31 ENCOUNTER — Other Ambulatory Visit: Payer: Self-pay

## 2025-01-31 NOTE — Patient Instructions (Signed)
 [name] - I am sorry I was unable to reach you today for our scheduled appointment. I work with Bhagat, Virali, DO and am calling to support your healthcare needs. Please contact me at [phone] at your earliest convenience. I look forward to speaking with you soon at our rescheduled appointment for 02/11/2025 at 11:30AM.   Thank you,   Laymon Doll, BSW [place] Social Worker [phone]

## 2025-02-11 ENCOUNTER — Telehealth: Payer: Self-pay

## 2025-02-28 ENCOUNTER — Telehealth: Admitting: *Deleted

## 2025-03-06 ENCOUNTER — Encounter
